# Patient Record
Sex: Female | Born: 1945 | Race: White | Hispanic: No | Marital: Married | Smoking: Former smoker
Health system: Southern US, Community
[De-identification: ages and names within clinical notes are randomized; demographics above are authoritative.]

## PROBLEM LIST (undated history)

## (undated) DIAGNOSIS — Z87448 Personal history of other diseases of urinary system: Secondary | ICD-10-CM

## (undated) DIAGNOSIS — M1990 Unspecified osteoarthritis, unspecified site: Secondary | ICD-10-CM

## (undated) DIAGNOSIS — J309 Allergic rhinitis, unspecified: Secondary | ICD-10-CM

## (undated) DIAGNOSIS — J189 Pneumonia, unspecified organism: Secondary | ICD-10-CM

## (undated) DIAGNOSIS — M797 Fibromyalgia: Secondary | ICD-10-CM

## (undated) DIAGNOSIS — I10 Essential (primary) hypertension: Secondary | ICD-10-CM

## (undated) DIAGNOSIS — E785 Hyperlipidemia, unspecified: Secondary | ICD-10-CM

## (undated) DIAGNOSIS — IMO0001 Reserved for inherently not codable concepts without codable children: Secondary | ICD-10-CM

## (undated) DIAGNOSIS — K219 Gastro-esophageal reflux disease without esophagitis: Secondary | ICD-10-CM

## (undated) DIAGNOSIS — N289 Disorder of kidney and ureter, unspecified: Secondary | ICD-10-CM

## (undated) DIAGNOSIS — K3184 Gastroparesis: Secondary | ICD-10-CM

## (undated) DIAGNOSIS — J45909 Unspecified asthma, uncomplicated: Secondary | ICD-10-CM

## (undated) DIAGNOSIS — Z8719 Personal history of other diseases of the digestive system: Secondary | ICD-10-CM

## (undated) DIAGNOSIS — D509 Iron deficiency anemia, unspecified: Secondary | ICD-10-CM

## (undated) DIAGNOSIS — C7A8 Other malignant neuroendocrine tumors: Secondary | ICD-10-CM

## (undated) DIAGNOSIS — K7400 Hepatic fibrosis, unspecified: Secondary | ICD-10-CM

## (undated) DIAGNOSIS — F32A Depression, unspecified: Secondary | ICD-10-CM

## (undated) DIAGNOSIS — I209 Angina pectoris, unspecified: Secondary | ICD-10-CM

## (undated) DIAGNOSIS — F329 Major depressive disorder, single episode, unspecified: Secondary | ICD-10-CM

## (undated) DIAGNOSIS — K7460 Unspecified cirrhosis of liver: Secondary | ICD-10-CM

## (undated) DIAGNOSIS — D485 Neoplasm of uncertain behavior of skin: Secondary | ICD-10-CM

## (undated) DIAGNOSIS — E119 Type 2 diabetes mellitus without complications: Secondary | ICD-10-CM

## (undated) DIAGNOSIS — H8090 Unspecified otosclerosis, unspecified ear: Secondary | ICD-10-CM

## (undated) DIAGNOSIS — F419 Anxiety disorder, unspecified: Secondary | ICD-10-CM

## (undated) DIAGNOSIS — K760 Fatty (change of) liver, not elsewhere classified: Secondary | ICD-10-CM

## (undated) DIAGNOSIS — F99 Mental disorder, not otherwise specified: Secondary | ICD-10-CM

## (undated) DIAGNOSIS — K5909 Other constipation: Secondary | ICD-10-CM

## (undated) HISTORY — DX: Essential (primary) hypertension: I10

## (undated) HISTORY — DX: Reserved for inherently not codable concepts without codable children: IMO0001

## (undated) HISTORY — DX: Gastro-esophageal reflux disease without esophagitis: K21.9

## (undated) HISTORY — PX: OTHER SURGICAL HISTORY: SHX169

## (undated) HISTORY — DX: Unspecified osteoarthritis, unspecified site: M19.90

## (undated) HISTORY — DX: Allergic rhinitis, unspecified: J30.9

## (undated) HISTORY — DX: Fibromyalgia: M79.7

## (undated) HISTORY — DX: Personal history of other diseases of urinary system: Z87.448

## (undated) HISTORY — DX: Pneumonia, unspecified organism: J18.9

## (undated) HISTORY — DX: Hyperlipidemia, unspecified: E78.5

## (undated) HISTORY — DX: Iron deficiency anemia, unspecified: D50.9

## (undated) HISTORY — DX: Personal history of other diseases of the digestive system: Z87.19

## (undated) HISTORY — PX: PR ESOPHAGOGASTRODUODENOSCOPY TRANSORAL DIAGNOSTIC: 43235

## (undated) HISTORY — PX: PR TOTAL ABDOMINAL HYSTERECT W/WO RMVL TUBE OVARY: 58150

## (undated) HISTORY — PX: PR TYMPANIC MEMB RPR W/WO PREPJ PERFOR PATCH: 69610

## (undated) HISTORY — DX: Other malignant neuroendocrine tumors: C7A.8

## (undated) HISTORY — PX: PR LIG/TRNSXJ FLP TUBE ABDL/VAG APPR UNI/BI: 58600

## (undated) HISTORY — DX: Gastroparesis: K31.84

## (undated) HISTORY — DX: Neoplasm of uncertain behavior of skin: D48.5

## (undated) HISTORY — DX: Unspecified otosclerosis, unspecified ear: H80.90

## (undated) HISTORY — PX: EGD: AMBSHX0053

## (undated) HISTORY — PX: PR ARTHROSCOPY KNEE DIAGNOSTIC W/WO SYNOVIAL BX SPX: 29870

## (undated) HISTORY — DX: Angina pectoris, unspecified: I20.9

---
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
## 1977-05-05 HISTORY — PX: KNEE ARTHROSCOPY: SUR90

## 1979-01-04 HISTORY — PX: TUBAL LIGATION: SHX77

## 1983-05-06 HISTORY — PX: ABDOMINAL HYSTERECTOMY: SHX81

## 1983-05-06 HISTORY — PX: MAMMOPLASTY, REDUCTION: SHX001378

## 1984-05-05 HISTORY — PX: BREAST SURGERY: SHX581

## 2007-05-22 ENCOUNTER — Encounter: Payer: Self-pay | Admitting: Family Medicine

## 2007-05-22 LAB — CONVERTED CEMR LAB
ALT: 37 units/L
AST: 44 units/L
Cholesterol: 120 mg/dL
Glucose, Bld: 100 mg/dL
Total Bilirubin: 1.3 mg/dL
Triglycerides: 109 mg/dL

## 2007-10-16 ENCOUNTER — Encounter: Payer: Self-pay | Admitting: Family Medicine

## 2008-01-13 ENCOUNTER — Encounter: Payer: Self-pay | Admitting: Family Medicine

## 2008-05-05 LAB — HM MAMMOGRAPHY

## 2008-05-29 ENCOUNTER — Encounter: Payer: Self-pay | Admitting: Family Medicine

## 2008-05-29 LAB — CONVERTED CEMR LAB
ALT: 46 units/L
AST: 53 units/L
Alkaline Phosphatase: 86 units/L

## 2008-07-11 ENCOUNTER — Encounter: Payer: Self-pay | Admitting: Family Medicine

## 2008-07-11 LAB — CONVERTED CEMR LAB
BUN: 17 mg/dL
Creatinine, Ser: 1 mg/dL
Free T4: 0.81 ng/dL
Glucose, Bld: 107 mg/dL
TSH: 0.39 microintl units/mL

## 2008-08-15 ENCOUNTER — Encounter: Payer: Self-pay | Admitting: Family Medicine

## 2008-12-19 ENCOUNTER — Encounter: Payer: Self-pay | Admitting: Family Medicine

## 2008-12-19 LAB — CONVERTED CEMR LAB
ALT: 43 U/L
AST: 42 U/L
BUN: 23 mg/dL
Cholesterol: 183 mg/dL
Creatinine, Ser: 1.1 mg/dL
Glucose, Bld: 101 mg/dL
HDL: 57 mg/dL
Hgb A1c MFr Bld: 5.8 %
LDL Cholesterol: 92 mg/dL
Microalb, Ur: 1.2 mg/dL
Potassium: 4.1 meq/L
Sodium: 146 meq/L
Total CHOL/HDL Ratio: 3.21
Triglycerides: 216 mg/dL

## 2009-01-22 ENCOUNTER — Encounter: Payer: Self-pay | Admitting: Family Medicine

## 2009-07-27 ENCOUNTER — Encounter: Payer: Self-pay | Admitting: Family Medicine

## 2009-07-27 LAB — CONVERTED CEMR LAB
Hgb A1c MFr Bld: 6.1 %
Potassium: 4.2 meq/L

## 2009-11-06 ENCOUNTER — Ambulatory Visit: Payer: Self-pay | Admitting: Family Medicine

## 2009-11-06 DIAGNOSIS — J309 Allergic rhinitis, unspecified: Secondary | ICD-10-CM | POA: Insufficient documentation

## 2009-11-06 DIAGNOSIS — K219 Gastro-esophageal reflux disease without esophagitis: Secondary | ICD-10-CM | POA: Insufficient documentation

## 2009-11-06 DIAGNOSIS — I10 Essential (primary) hypertension: Secondary | ICD-10-CM | POA: Insufficient documentation

## 2009-11-06 DIAGNOSIS — M79609 Pain in unspecified limb: Secondary | ICD-10-CM | POA: Insufficient documentation

## 2009-11-06 DIAGNOSIS — E119 Type 2 diabetes mellitus without complications: Secondary | ICD-10-CM | POA: Insufficient documentation

## 2009-11-06 DIAGNOSIS — IMO0001 Reserved for inherently not codable concepts without codable children: Secondary | ICD-10-CM | POA: Insufficient documentation

## 2009-11-06 DIAGNOSIS — Z87448 Personal history of other diseases of urinary system: Secondary | ICD-10-CM | POA: Insufficient documentation

## 2009-11-06 DIAGNOSIS — M1990 Unspecified osteoarthritis, unspecified site: Secondary | ICD-10-CM | POA: Insufficient documentation

## 2009-11-06 DIAGNOSIS — E785 Hyperlipidemia, unspecified: Secondary | ICD-10-CM

## 2009-11-06 DIAGNOSIS — Z8619 Personal history of other infectious and parasitic diseases: Secondary | ICD-10-CM

## 2009-11-06 DIAGNOSIS — Z9189 Other specified personal risk factors, not elsewhere classified: Secondary | ICD-10-CM | POA: Insufficient documentation

## 2009-11-08 LAB — CONVERTED CEMR LAB: Hgb A1c MFr Bld: 5.9 % (ref 4.6–6.5)

## 2009-11-29 ENCOUNTER — Encounter: Payer: Self-pay | Admitting: Family Medicine

## 2009-12-03 ENCOUNTER — Encounter: Payer: Self-pay | Admitting: Family Medicine

## 2009-12-03 ENCOUNTER — Telehealth: Payer: Self-pay | Admitting: Family Medicine

## 2009-12-04 ENCOUNTER — Encounter: Payer: Self-pay | Admitting: Family Medicine

## 2010-02-11 ENCOUNTER — Ambulatory Visit: Payer: Self-pay | Admitting: Family Medicine

## 2010-02-12 ENCOUNTER — Telehealth: Payer: Self-pay | Admitting: Family Medicine

## 2010-02-12 LAB — CONVERTED CEMR LAB: Hgb A1c MFr Bld: 5.9 % (ref 4.6–6.5)

## 2010-02-13 ENCOUNTER — Telehealth: Payer: Self-pay | Admitting: Family Medicine

## 2010-02-19 ENCOUNTER — Ambulatory Visit: Payer: Self-pay | Admitting: Family Medicine

## 2010-02-19 DIAGNOSIS — L659 Nonscarring hair loss, unspecified: Secondary | ICD-10-CM | POA: Insufficient documentation

## 2010-02-19 DIAGNOSIS — M25579 Pain in unspecified ankle and joints of unspecified foot: Secondary | ICD-10-CM | POA: Insufficient documentation

## 2010-02-20 LAB — CONVERTED CEMR LAB: TSH: 0.54 microintl units/mL (ref 0.35–5.50)

## 2010-03-05 ENCOUNTER — Telehealth: Payer: Self-pay | Admitting: Family Medicine

## 2010-03-06 ENCOUNTER — Encounter: Payer: Self-pay | Admitting: Family Medicine

## 2010-04-19 ENCOUNTER — Encounter: Payer: Self-pay | Admitting: Family Medicine

## 2010-04-24 ENCOUNTER — Telehealth: Payer: Self-pay | Admitting: Family Medicine

## 2010-05-08 ENCOUNTER — Telehealth: Payer: Self-pay | Admitting: Family Medicine

## 2010-05-10 ENCOUNTER — Ambulatory Visit
Admission: RE | Admit: 2010-05-10 | Discharge: 2010-05-10 | Payer: Self-pay | Source: Home / Self Care | Attending: Family Medicine | Admitting: Family Medicine

## 2010-05-10 ENCOUNTER — Other Ambulatory Visit: Payer: Self-pay | Admitting: Family Medicine

## 2010-05-10 LAB — LIPID PANEL
Cholesterol: 151 mg/dL (ref 0–200)
HDL: 54.1 mg/dL (ref >39.00)
LDL Cholesterol: 76 mg/dL (ref 0–99)
Total CHOL/HDL Ratio: 3
Triglycerides: 107 mg/dL (ref 0.0–149.0)
VLDL: 21.4 mg/dL (ref 0.0–40.0)

## 2010-05-10 LAB — HEMOGLOBIN A1C: Hgb A1c MFr Bld: 6.3 % (ref 4.6–6.5)

## 2010-06-04 NOTE — Letter (Signed)
Summary: Letter from Patient with Concerns  Letter from Patient with Concerns   Imported By: Lanelle Bal 12/10/2009 09:08:55  _____________________________________________________________________  External Attachment:    Type:   Image     Comment:   External Document

## 2010-06-04 NOTE — Assessment & Plan Note (Signed)
Summary: ESTABH FROM [facility]   Vital Signs:  Patient profile:   65 year old female Height:      62 inches Weight:      169.50 pounds BMI:     31.11 Temp:     98.4 degrees F oral Pulse rate:   80 / minute Pulse rhythm:   regular BP sitting:   128 / 74  (left arm) Cuff size:   large  Vitals Entered By: Samantha Simmons) (November 06, 2009 2:44 PM) CC: Establish from [place]   History of Present Illness: Fibromyalgia.  Longstanding history. Tolerated increase in topamax with nonbothersome tingling in hands.  Overall pain is much improved from initial level.  Uses vicodin, usually about 1 tab a day for breakthrough pain.  somedays requires no vicodin.  Has had previous responsible use of vicodin w/o h/o abuse/misuse.  asking about uptaper of meds at this point.  Has some topamax at home- either 25 or 50mg  caps from prev rx.  Taking 200mg  a day w/o complication.    R foot pain.  Near distal portion of the arch, on dorsal/plantar/medial side.  no trauma.  Present episodically for several weeks.  Pain with first step in AM.  Some days are more painful than others.  No change with shoes/barefoot.    1 episode of chest discomfort immediately after a meal that lasted about 1 minute.  Self resolved.  happened several weeks ago.  Hx of similar symptoms over last few years ( ~3-4 times total).  Always self revolved w/o intervention.   No h/o CAD but noted h/o GERD and h/o NSAID use for knee pain. Not SOB, sweaty, or having jaw pain with episode.  Occurred at rest and has good exercise tolerance o/w.   Diabetes:  Using medications without difficulties:yes Hypoglycemic episodes:no Hyperglycemic episodes:no Feet problems:as above Blood Sugars averaging:  ~120s.  Compliant with meds.  Has seen eye MD in <1 year.   Preventive Screening-Counseling & Management  Alcohol-Tobacco     Smoking Status: quit  Caffeine-Diet-Exercise     Does Patient Exercise: no  Allergies (verified): 1)  ! * Ivp  Dye  Past History:  Past Medical History: UTI'S, HX OF (ICD-V13.00) CHICKENPOX, HX OF (ICD-V15.9) HYPERTENSION (ICD-401.9) HYPERLIPIDEMIA (ICD-272.4) HEPATITIS B, HX OF (ICD-V12.09) DIABETES MELLITUS, TYPE II (ICD-250.00) ALLERGIC RHINITIS (ICD-477.9)   Osteoarthritis GERD  Past Surgical History: 1952    Pneumonia 1967    Childbirth Mid 70's  Stapidectomy (Left ear) Early 46's Stapidectomy (Right ear) Early 80's  Tubal Ligation 1985    Hysterectomy 1986    Reduction mammoplasty Early 1979   Arthroscopic Right Knee  (Multiple)  Family History: Heart Disease:  Parents, grandparents High blood pressure:  Parents Diabetes:  Parents, grandparents  Social History: Marital Status: Married Children: 1 Occupation: Retired Former Smoker, Have not smoked in over 22 years Alcohol use-yes, very little Regular exercise-no Smoking Status:  quit Does Patient Exercise:  no  Review of Systems       See HPI.  Otherwise noncontributory.    Physical Exam  General:  GEN: nad, alert and oriented HEENT: mucous membranes moist NECK: supple, no bruit CV: regular rate and rhythm  PULM: ctab, no inc wob ABD: soft, +bs EXT: no edema R foot: no loss of sensation.  Slight decrease in transverse arch.  Minimally tender to palpation on medial aspect of distal 1st MT.  prox longitudinal arch not tender to palpation. No gross gouty changes.    Impression &  Recommendations:  Problem # 1:  FIBROMYALGIA (ICD-729.1) Will taper meloxicam as below and contact clinic with update. Can increase topamax up to 250 mg a day as long as she is tolerating the numbness in the hands (which has gradually improved).  Continue as needed vicodin.  No new rx for this today.  Her updated medication list for this problem includes:    Meloxicam 15 Mg Tabs (Meloxicam) .Marland Kitchen... Take 1 tablet by mouth every morning    Aspirin 81 Mg Tabs (Aspirin) .Marland Kitchen... Take 1 tab by mouth at bedtime    Hydrocodone-acetaminophen 5-500  Mg Tabs (Hydrocodone-acetaminophen) .Marland Kitchen... Take 1 tablet by mouth two times a day for severe pain  Her updated medication list for this problem includes:    Meloxicam 15 Mg Tabs (Meloxicam) .Marland Kitchen... Take 1 tablet by mouth every morning    Aspirin 81 Mg Tabs (Aspirin) .Marland Kitchen... Take 1 tab by mouth at bedtime    Hydrocodone-acetaminophen 5-500 Mg Tabs (Hydrocodone-acetaminophen) .Marland Kitchen... Take 1 tablet by mouth two times a day for severe pain  Problem # 2:  FOOT PAIN, RIGHT (ICD-729.5) Rec inserts and stretching arch before getting out of bed.  No indication for imaging today.  follow up as needed.  she agrees.   Problem # 3:  DIABETES MELLITUS, TYPE II (ICD-250.00) Contact with labs and will attempt to taper metformin.  Continue exercise.  Her updated medication list for this problem includes:    Metformin Hcl 500 Mg Tabs (Metformin hcl) .Marland Kitchen... Take 1 tab by mouth at bedtime    Aspirin 81 Mg Tabs (Aspirin) .Marland Kitchen... Take 1 tab by mouth at bedtime  Her updated medication list for this problem includes:    Metformin Hcl 500 Mg Tabs (Metformin hcl) .Marland Kitchen... Take 1 tab by mouth at bedtime    Aspirin 81 Mg Tabs (Aspirin) .Marland Kitchen... Take 1 tab by mouth at bedtime  Orders: TLB-A1C / Hgb A1C (Glycohemoglobin) (83036-A1C)  Problem # 4:  GERD (ICD-530.81) taper NSAID as pain in foot allows and follow up as needed.  Her updated medication list for this problem includes:    Omeprazole 20 Mg Cpdr (Omeprazole) .Marland Kitchen... Take 1 tablet by mouth every morning  Complete Medication List: 1)  Omeprazole 20 Mg Cpdr (Omeprazole) .... Take 1 tablet by mouth every morning 2)  Amlodipine Besylate 10 Mg Tabs (Amlodipine besylate) .... Take 1 tablet by mouth every morning 3)  Hydrochlorothiazide 25 Mg Tabs (Hydrochlorothiazide) .... Take 1 tablet by mouth every morning 4)  Meloxicam 15 Mg Tabs (Meloxicam) .... Take 1 tablet by mouth every morning 5)  Metformin Hcl 500 Mg Tabs (Metformin hcl) .... Take 1 tab by mouth at bedtime 6)   Simvastatin 20 Mg Tabs (Simvastatin) .... Take 1 tab by mouth at bedtime 7)  Topamax 200 Mg Tabs (Topiramate) .... Take 1 tab by mouth at bedtime 8)  Vitamin D 1000 Unit Tabs (Cholecalciferol) .... 2,000 international units take 1 tablet by mouth every morning 9)  Aspirin 81 Mg Tabs (Aspirin) .... Take 1 tab by mouth at bedtime 10)  Fish Oil Oil (Fish oil) .... 1,000 mg. take 1 tab by mouth at bedtime 11)  Proventil Hfa 108 (90 Base) Mcg/act Aers (Albuterol sulfate) .... 2 puffs every 4 hours as needed 12)  Hydrocodone-acetaminophen 5-500 Mg Tabs (Hydrocodone-acetaminophen) .... Take 1 tablet by mouth two times a day for severe pain 13)  Oxybutynin Chloride 5 Mg Tabs (Oxybutynin chloride) .... Take 1 tablet by mouth three times a day 14)  Diphenhydramine Hcl  25 Mg Caps (Diphenhydramine hcl) .... One or two tabs every 4-6 hours as needed   Patient Instructions: 1)  Call back at the end of the month.  Plan to cut meloxicam dose in half and monitor for change in pain level.  If doing well, call add on home supply of topamax up to total of 250mg  a day.  Monitor for numbness and note progress.  Use arch supports for R foot pain.    Current Allergies (reviewed today): ! * IVP DYE

## 2010-06-04 NOTE — Progress Notes (Signed)
Summary: pt wrote you a letter  Phone Note Call from Patient Call back at Home Phone [phone]   Caller: Patient Call For: [name] Summary of Call: Pt has written you a letter regarding meds and updates.  Letter is on your desk.                   Lowella Petties CMA, AAMA  March 05, 2010 4:27 PM   Follow-up for Phone Call        will review when back in office.  Follow-up by: [name],  March 05, 2010 10:29 PM

## 2010-06-04 NOTE — Progress Notes (Signed)
Summary: Rx Omeprazole  Phone Note From Pharmacy Call back at [phone]   Caller: [facility] Call For: Dr. Para March  Summary of Call: Received a form from pharmacy to verify directions on Omeprazole, directions do not match the quantity. Form is in your in box.  Med list shows every other day.  Spoke to patient and was informed that she tried to cut back on this and had to go back to taking this every day. Changed on med sheet. Please complete form and will fax back. It should be once a day and #90. Initial call taken by: Sydell Axon LPN,  February 13, 2010 9:15 AM  Follow-up for Phone Call        signed, in my out box.  please send in.  Follow-up by: [name],  February 13, 2010 1:59 PM  Additional Follow-up for Phone Call Additional follow up Details #1::        Form faxed as directed Additional Follow-up by: Janee Morn CMA Duncan Dull),  February 13, 2010 2:27 PM    New/Updated Medications: OMEPRAZOLE 20 MG CPDR (OMEPRAZOLE) Take 1 tablet by mouth every morning

## 2010-06-04 NOTE — Progress Notes (Signed)
Summary: refill requests for mobic, topiramate, vicodin  Phone Note Refill Request Message from:  Patient  Refills Requested: Medication #1:  MELOXICAM 15 MG TABS Take 1 tablet by mouth every morning  Medication #2:  TOPAMAX 200 MG TABS Take 1 tab by mouth at bedtime  Medication #3:  HYDROCODONE-ACETAMINOPHEN 5-500 MG TABS Take 1 tablet by mouth two times a day for severe pain Please send mobic and topamax to [facility], vicodin goes to [facility] [address].  Initial call taken by: Lowella Petties CMA,  February 12, 2010 11:00 AM  Follow-up for Phone Call        please call in the vicodin.  the others were sent to [facility].  thanks.  Follow-up by: [name],  February 12, 2010 2:08 PM  Additional Follow-up for Phone Call Additional follow up Details #1::        Rx called to pharmacy Additional Follow-up by: Benny Lennert CMA [facility] Dull),  February 12, 2010 2:17 PM    Prescriptions: HYDROCODONE-ACETAMINOPHEN 5-500 MG TABS (HYDROCODONE-ACETAMINOPHEN) Take 1 tablet by mouth two times a day for severe pain  #60 x 5   Entered and Authorized by:   [name]   Signed by:   [name] on 02/12/2010   Method used:   Telephoned to ...       [facility]  [id number]       [address]       [place], [place]  [address]       Ph: [phone] or [phone]       Fax: [phone]   RxID:   [id number] TOPAMAX 200 MG TABS (TOPIRAMATE) Take 1 tab by mouth at bedtime  #90 x 3   Entered and Authorized by:   [name]   Signed by:   [name] on 02/12/2010   Method used:   Faxed to ...       [facility] (mail-order)             , [place]         Ph: [phone]       Fax: [phone]   RxID:   [id number] MELOXICAM 15 MG TABS (MELOXICAM) Take 1 tablet by mouth every morning  #90 x 3   Entered and Authorized by:   [name]   Signed by:   [name] on 02/12/2010   Method used:   Faxed to ...       [facility] [place] (mail-order)             , [place]         Ph: [phone]       Fax: [phone]   RxID:   [id number]

## 2010-06-04 NOTE — Miscellaneous (Signed)
  Clinical Lists Changes  Medications: Added new medication of TOPIRAMATE 50 MG TABS (TOPIRAMATE) Take 1 tablet by mouth once a day - Signed Changed medication from OMEPRAZOLE 20 MG CPDR (OMEPRAZOLE) Take 1 tablet by mouth every morning to OMEPRAZOLE 20 MG CPDR (OMEPRAZOLE) Take 1 tablet by mouth every other morning. Rx of TOPIRAMATE 50 MG TABS (TOPIRAMATE) Take 1 tablet by mouth once a day;  #90 x 3;  Signed;  Entered by: Delilah Shan CMA (AAMA);  Authorized by: [name];  Method used: Faxed to [facility] [place], , , [place]  , Ph: [phone], Fax: [phone]    Prescriptions: TOPIRAMATE 50 MG TABS (TOPIRAMATE) Take 1 tablet by mouth once a day  #90 x 3   Entered by:   Delilah Shan CMA (AAMA)   Authorized by:   [name]   Signed by:   Delilah Shan CMA (AAMA) on 12/04/2009   Method used:   Faxed to ...       [facility] (mail-order)             , [place]         Ph: [phone]       Fax: [phone]   RxID:   [id number]

## 2010-06-04 NOTE — Letter (Signed)
Summary: Letter from Patient Regarding Meds  Letter from Patient Regarding Meds   Imported By: Lanelle Bal 03/12/2010 07:59:16  _____________________________________________________________________  External Attachment:    Type:   Image     Comment:   External Document

## 2010-06-04 NOTE — Progress Notes (Signed)
Summary: Update from patient  Phone Note Call from Patient Call back at [phone]   Caller: Patient Call For: [name] Summary of Call: Please see typed letter that patient dropped off for you regarding her medications and possible refills. Note is in your in box. Initial call taken by: Sydell Axon LPN,  December 03, 2009 2:04 PM  Follow-up for Phone Call        see notes on hardcopy.  thanks.  Follow-up by: [name],  December 03, 2009 9:39 PM  Additional Follow-up for Phone Call Additional follow up Details #1::        Done.  See scanned copy of letter with instructions. Additional Follow-up by: Delilah Shan CMA (AAMA),  December 04, 2009 9:16 AM

## 2010-06-04 NOTE — Assessment & Plan Note (Signed)
Summary: L HEEL PAIN,CHECK PLACE ON R FOOT/CLE   Vital Signs:  Patient profile:   65 year old female Weight:      167.12 pounds Temp:     98.2 degrees F oral Pulse rate:   72 / minute Pulse rhythm:   regular BP sitting:   112 / 70  (left arm) Cuff size:   large  Vitals Entered By: Sydell Axon LPN (February 19, 2010 1:55 PM) CC: Heel pain left foot and check place on right foot   History of Present Illness: L ankle painful with stretching this AM.  Pain with walking.  This is new for patient.  No trauma.  Normally wearing supportive shoes.  Didn't feel a pop.    Possibly stepped on something a few months ago.  Husband checked it and thought he got something out of lesion on R foot.  Since then, patient feels like she is stepping on something.  No erythema.   Hair is thinning out.  Progressive over last  ~1 year.  On topamax since spring 2010.  No other triggers known.   Allergies: 1)  ! * Ivp Dye  Review of Systems       See HPI.  Otherwise negative.    Physical Exam  General:  NAD hair is thinning, no discrete patches of alopecia.  loss is more diffuse.   L ankle: not tender to palpation on bilateral mal, achilles is tender to palpation. not tender to palpation on plantar aspect R foot with distal plantar hyperkeratosis, no FB appreciated.    Impression & Recommendations:  Problem # 1:  FOOT PAIN, RIGHT (ICD-729.5) small lesion with out visible FB on bottom of foot.  I would address L foot and then explore this if symptoms continue.  She agrees.   Problem # 2:  HAIR LOSS (ICD-704.00) start MVI, check TSH and if wnl, decrease topamax.  This may be due to the topamax, which would be unfortunate as it has provided so much relief for patient's pain.   Orders: TLB-TSH (Thyroid Stimulating Hormone) (84443-TSH)  Problem # 3:  ANKLE PAIN, LEFT (ICD-719.47) crutches, no weight bearing and call back if not better.  If improving, can graduate into a shoe with heel wedge. Likely  achilles strain, partial tear.  If not improving, refer to ortho.   Orders: T-Ankle Comp Left Min 3 Views (73610TC)  Complete Medication List: 1)  Omeprazole 20 Mg Cpdr (Omeprazole) .... Take 1 tablet by mouth every morning 2)  Amlodipine Besylate 10 Mg Tabs (Amlodipine besylate) .... Take 1 tablet by mouth every morning 3)  Hydrochlorothiazide 25 Mg Tabs (Hydrochlorothiazide) .... Take 1 tablet by mouth every morning 4)  Meloxicam 15 Mg Tabs (Meloxicam) .... Take 1 tablet by mouth every morning 5)  Simvastatin 20 Mg Tabs (Simvastatin) .... Take 1 tab by mouth at bedtime 6)  Topamax 200 Mg Tabs (Topiramate) .... Take 1 tab by mouth at bedtime 7)  Vitamin D 1000 Unit Tabs (Cholecalciferol) .... 2,000 international units take 1 tablet by mouth every morning 8)  Aspirin 81 Mg Tabs (Aspirin) .... Take 1 tab by mouth at bedtime 9)  Fish Oil Oil (Fish oil) .... 1,000 mg. take 1 tab by mouth at bedtime 10)  Proventil Hfa 108 (90 Base) Mcg/act Aers (Albuterol sulfate) .... 2 puffs every 4 hours as needed 11)  Hydrocodone-acetaminophen 5-500 Mg Tabs (Hydrocodone-acetaminophen) .... Take 1 tablet by mouth two times a day for severe pain 12)  Oxybutynin Chloride 5 Mg  Tabs (Oxybutynin chloride) .... Take 1 tablet by mouth three times a day 13)  Diphenhydramine Hcl 25 Mg Caps (Diphenhydramine hcl) .... One or two tabs every 4-6 hours as needed 14)  Topiramate 50 Mg Tabs (Topiramate) .... Take 1 tablet by mouth once a day  Patient Instructions: 1)  I'll let your know about your topamax dose.  We'll need to decrease it if your thyroid test is normal.   2)  I would use the crutches and don't put any weight on your foot.  Use the vicodin in the meantime.  If you aren't improving, we can set you up with ortho.  Call me later in the week if you aren't better. 3)  Take care.    Orders Added: 1)  Est. Patient Level IV [09811] 2)  T-Ankle Comp Left Min 3 Views [73610TC] 3)  TLB-TSH (Thyroid Stimulating  Hormone) [91478-GNF]    Current Allergies (reviewed today): ! * IVP DYE

## 2010-06-04 NOTE — Letter (Signed)
Summary: [facility]   Imported By: Lanelle Bal 12/10/2009 09:11:57  _____________________________________________________________________  External Attachment:    Type:   Image     Comment:   External Document  Appended Document: [facility]    Clinical Lists Changes  Observations: Added new observation of DIAB EYE EX: No diabetic retinopathy [DR] (01/22/2009 14:00)         Diabetic Eye Exam  Procedure date:  01/22/2009  Findings:      No diabetic retinopathy [DR]

## 2010-06-06 NOTE — Progress Notes (Signed)
Summary: regarding labs  Phone Note Call from Patient Call back at Home Phone [phone]   Caller: Patient Call For: [name] Summary of Call: Pt states she went to her dermatologist and was dx'd with shingles.  She is coming in for lab work on friday and asks if you want to add any labs to what she is getting. Initial call taken by: Lowella Petties CMA, AAMA,  May 08, 2010 9:43 AM  Follow-up for Phone Call        can you get me a list of her labs to be drawn on Friday? Follow-up by: [name],  May 08, 2010 11:23 AM  Additional Follow-up for Phone Call Additional follow up Details #1::        A1c, dx 250.00; Lipid 272.0 /rl Additional Follow-up by: Mills Koller,  May 08, 2010 11:26 AM    Additional Follow-up for Phone Call Additional follow up Details #2::    please tell patient that she should be good with the labs as is- we can talk about them on the 11th.  thanks. [name]  May 08, 2010 11:31 AM   Patient Advised. Lugene Fuquay CMA (AAMA)  May 08, 2010 11:49 AM

## 2010-06-06 NOTE — Progress Notes (Signed)
Summary: Other labs  Phone Note Call from Patient Call back at Home Phone [phone]   Caller: Patient Call For: [name] Summary of Call: When I phoned the patient, as requested, from a previous phone note, she was advised that we would be adding a lipid panel to her lab draw in January.  She then asked if that would test for the problem that she is having with hot flashes.  She said she had not spoken to you about this but that it is getting pretty bad.  I told her that I would mention it to you but because the two of you had not discussed it at an OV, I wasn't sure that you would feel comfortable ordering labs for that problem.  She said she totally understood and that if you could not, she would speak with you at her next OV and do it then or if it becomes worse, she will schedule an appointment to come in for it. Initial call taken by: Delilah Shan CMA Duncan Dull),  April 24, 2010 8:49 AM  Follow-up for Phone Call        usually you don't have to check labs (ie hormone levels) for hot flashes in postmenopausal women. if it gets worse, let me know and we can talk about it at an OV.  thanks.  Follow-up by: [name],  April 24, 2010 11:44 AM  Additional Follow-up for Phone Call Additional follow up Details #1::        Patient Advised.  Additional Follow-up by: Delilah Shan CMA Duncan Dull),  April 24, 2010 2:50 PM

## 2010-06-06 NOTE — Letter (Signed)
Summary: Letter from Patient to Dr.Duncan  Letter from Patient to Dr.Duncan   Imported By: Beau Fanny 04/22/2010 16:55:19  _____________________________________________________________________  External Attachment:    Type:   Image     Comment:   External Document  Appended Document: Letter from Patient to Dr.Duncan Please call patient.  I would add on lipid (272.0) to the A1c in 1/12.  If lipids are much improved, I would think about cutting down on the simvastatin. If her BP gets lower, we could decrease some of the BP meds. Please ask patient to talk to Dr. Jorja Loa about the hair loss/thinning and see if he thinks it is related to the topamax.  It would be reasonable to get patient's pain controlled on the lowest dose of vicodin (in the long term) but I wouldn't change her meds around for now.  I'd like her to see Dr. Jorja Loa and then we should meet in the spring to talk about options for pain meds.  thanks.   Patient Advised.  Lipid added to labs in January.  Lugene Fuquay CMA Duncan Dull)  April 24, 2010 8:43 AM    Clinical Lists Changes  Medications: Removed medication of TOPIRAMATE 50 MG TABS (TOPIRAMATE) Take 1 tablet by mouth once a day - Signed Removed medication of TOPAMAX 200 MG TABS (TOPIRAMATE) Take 1 tab by mouth at bedtime - Signed Allergies: Added new allergy or adverse reaction of * TOPAMAX - Signed Observations: Added new observation of MEDRECON: current updated (04/22/2010 23:56) Added new observation of ALLERGY REV: Done (04/22/2010 23:56)        Current Medications (verified): 1)  Omeprazole 20 Mg Cpdr (Omeprazole) .... Take 1 Tablet By Mouth Every Morning 2)  Amlodipine Besylate 10 Mg Tabs (Amlodipine Besylate) .... Take 1 Tablet By Mouth Every Morning 3)  Hydrochlorothiazide 25 Mg Tabs (Hydrochlorothiazide) .... Take 1 Tablet By Mouth Every Morning 4)  Meloxicam 15 Mg Tabs (Meloxicam) .... Take 1 Tablet By Mouth Every Morning 5)  Simvastatin 20 Mg Tabs  (Simvastatin) .... Take 1 Tab By Mouth At Bedtime 6)  Vitamin D 1000 Unit  Tabs (Cholecalciferol) .... 2,000 International Units Take 1 Tablet By Mouth Every Morning 7)  Aspirin 81 Mg  Tabs (Aspirin) .... Take 1 Tab By Mouth At Bedtime 8)  Fish Oil   Oil (Fish Oil) .... 1,000 Mg. Take 1 Tab By Mouth At Bedtime 9)  Proventil Hfa 108 (90 Base) Mcg/act Aers (Albuterol Sulfate) .... 2 Puffs Every 4 Hours As Needed 10)  Hydrocodone-Acetaminophen 5-500 Mg Tabs (Hydrocodone-Acetaminophen) .... Take 1 Tablet By Mouth Two Times A Day For Severe Pain 11)  Oxybutynin Chloride 5 Mg Tabs (Oxybutynin Chloride) .... Take 1 Tablet By Mouth Three Times A Day 12)  Diphenhydramine Hcl 25 Mg Caps (Diphenhydramine Hcl) .... One or Two Tabs Every 4-6 Hours As Needed  Allergies: 1)  ! * Ivp Dye 2)  ! * Topamax

## 2010-07-19 ENCOUNTER — Ambulatory Visit (INDEPENDENT_AMBULATORY_CARE_PROVIDER_SITE_OTHER): Payer: BC Managed Care – PPO | Admitting: Family Medicine

## 2010-07-19 ENCOUNTER — Encounter: Payer: Self-pay | Admitting: Family Medicine

## 2010-07-19 DIAGNOSIS — M25569 Pain in unspecified knee: Secondary | ICD-10-CM | POA: Insufficient documentation

## 2010-07-26 ENCOUNTER — Telehealth: Payer: Self-pay | Admitting: *Deleted

## 2010-07-26 NOTE — Telephone Encounter (Signed)
Please advise me on adding IVP dye to the pt's allergy list.  Thanks.

## 2010-07-26 NOTE — Telephone Encounter (Signed)
Pt called to report that her left knee is doing better.  Her fibromyalgia pain is no better.  She is having some pain in her right knee, but she thinks that is from over compensating.

## 2010-07-26 NOTE — Telephone Encounter (Signed)
I called pt.  She was intolerant of lyrica and had hair loss on tomapax.  She is improving with the knee pain.  She's going to [place] soon and will need a refill on the vicodin.  We talked about cymbalta.  She's going to think about this in the meantime and call me after she gets back from the trip.    Please call in vicodin with same sig as prev, #60, 1rf.  [facility].  Thanks.  Okay to call in Monday.

## 2010-07-30 NOTE — Telephone Encounter (Signed)
Omnipaque dye is contrast dye in epic and was added to allergy list

## 2010-07-31 NOTE — Telephone Encounter (Signed)
Medication called to pharmacy. 

## 2010-08-01 NOTE — Assessment & Plan Note (Signed)
Summary: LEFT KNEE PAIN/CLE  [employer]   Vital Signs:  Patient profile:   65 year old female Height:      62 inches Weight:      192 pounds BMI:     35.24 Temp:     98.1 degrees F oral Pulse rate:   76 / minute Pulse rhythm:   regular BP sitting:   124 / 76  (left arm) Cuff size:   large  Vitals Entered By: Delilah Shan CMA Darald Uzzle Dull) (July 19, 2010 10:47 AM) CC: Left knee pain   History of Present Illness: Prev ankle pain got better.   Current L knee pain started up  ~1 week ago.   H/o R patellar fx and occ R knee pain as she was compensating.  No trigger for L knee pain.  Has pain across the front and back of knee and down the upper portion of the calf.  Not puffy, red, swollen.  no popping, clicking, locking.  Hasn't had to go up stairs.  On meloxicam.  Vicodin helped some. Better early AM, but hurts before getting out of bed.  Worse after getting up from sitting down.  Better after walking for a short distance.    Inc in pain of the topamax but her hair is improved.    Has urinary frequency, but no pain, burning.  Had prev used ditropan with relief.   Allergies: 1)  ! * Ivp Dye 2)  ! * Topamax  Past History:  Past Medical History: Last updated: 11/29/2009 UTI'S, HX OF (ICD-V13.00) CHICKENPOX, HX OF (ICD-V15.9) HYPERTENSION (ICD-401.9) HYPERLIPIDEMIA (ICD-272.4) HEPATITIS B, HX OF (ICD-V12.09) DIABETES MELLITUS, TYPE II (ICD-250.00) ALLERGIC RHINITIS (ICD-477.9) Fibromyalgia Osteoarthritis GERD  Review of Systems       See HPI.  Otherwise negative.    Physical Exam  General:  no apparent distress normocephalic atraumatic L knee not puffy or bruised, no erythema normal range of motion, no patellar click.  joint line not tender to palpation posterrior medial side of knee tender to palpation but not at the distal hamstring- she is tender near the origin of the gatroc.  body of gastroc not tender to palpation.  ligamentously intact on testing of the knee w/o meniscal  findings .   Impression & Recommendations:  Problem # 1:  KNEE PAIN, LEFT (ICD-719.46) likely gastroc strain.  I would rest and follow up as needed.  She has heel lifts; continue those.  no indication for imaging.   Her updated medication list for this problem includes:    Meloxicam 15 Mg Tabs (Meloxicam) .Marland Kitchen... Take 1 tablet by mouth every morning    Aspirin 81 Mg Tabs (Aspirin) .Marland Kitchen... Take 1 tab by mouth at bedtime    Hydrocodone-acetaminophen 5-500 Mg Tabs (Hydrocodone-acetaminophen) .Marland Kitchen... Take 1 tablet by mouth two times a day for severe pain  Complete Medication List: 1)  Omeprazole 20 Mg Cpdr (Omeprazole) .... Take 1 tablet by mouth every morning 2)  Amlodipine Besylate 10 Mg Tabs (Amlodipine besylate) .... Take 1 tablet by mouth every morning 3)  Hydrochlorothiazide 25 Mg Tabs (Hydrochlorothiazide) .... Take 1 tablet by mouth every morning 4)  Meloxicam 15 Mg Tabs (Meloxicam) .... Take 1 tablet by mouth every morning 5)  Simvastatin 20 Mg Tabs (Simvastatin) .... Take 1 tab by mouth at bedtime 6)  Vitamin D 1000 Unit Tabs (Cholecalciferol) .... 2,000 international units take 1 tablet by mouth every morning 7)  Aspirin 81 Mg Tabs (Aspirin) .... Take 1 tab by mouth at bedtime  8)  Fish Oil Oil (Fish oil) .... 1,000 mg. take 1 tab by mouth at bedtime 9)  Proventil Hfa 108 (90 Base) Mcg/act Aers (Albuterol sulfate) .... 2 puffs every 4 hours as needed 10)  Hydrocodone-acetaminophen 5-500 Mg Tabs (Hydrocodone-acetaminophen) .... Take 1 tablet by mouth two times a day for severe pain 11)  Oxybutynin Chloride 5 Mg Tabs (Oxybutynin chloride) .... Take 1 tablet by mouth three times a day 12)  Diphenhydramine Hcl 25 Mg Caps (Diphenhydramine hcl) .... One or two tabs every 4-6 hours as needed 13)  Multivitamins Tabs (Multiple vitamin) .... Take 1 tablet by mouth once a day 14)  Vitamin B Complex-c Caps (B complex-c) .... Take 1 capsule by mouth once a day  Patient Instructions: 1)  I would take  the meloxicam and the vicodin for now.  You can ice your knee for the pain.  I would gently stretch it over the next few days.  Call me with an update.  Use the ditropan and call me if you have any burning.   2)  Take care.     Orders Added: 1)  Est. Patient Level III [04540]    Current Allergies (reviewed today): ! * IVP DYE ! * TOPAMAX

## 2010-09-06 ENCOUNTER — Other Ambulatory Visit: Payer: Self-pay | Admitting: *Deleted

## 2010-09-06 MED ORDER — MELOXICAM 15 MG PO TABS: ORAL_TABLET | ORAL | Status: DC

## 2010-09-06 NOTE — Telephone Encounter (Signed)
Patient used to get this through mail order, but is now [facility]

## 2010-09-06 NOTE — Telephone Encounter (Signed)
Sent!

## 2010-09-11 ENCOUNTER — Other Ambulatory Visit: Payer: Self-pay | Admitting: *Deleted

## 2010-09-11 MED ORDER — AMLODIPINE BESYLATE 10 MG PO TABS: ORAL_TABLET | ORAL | Status: DC

## 2010-09-11 MED ORDER — OMEPRAZOLE 20 MG PO TBEC: DELAYED_RELEASE_TABLET | ORAL | Status: DC

## 2010-09-11 MED ORDER — HYDROCHLOROTHIAZIDE 25 MG PO TABS: ORAL_TABLET | ORAL | Status: DC

## 2010-09-24 ENCOUNTER — Ambulatory Visit (INDEPENDENT_AMBULATORY_CARE_PROVIDER_SITE_OTHER): Payer: BC Managed Care – PPO | Admitting: Family Medicine

## 2010-09-24 ENCOUNTER — Encounter: Payer: Self-pay | Admitting: Family Medicine

## 2010-09-24 DIAGNOSIS — IMO0001 Reserved for inherently not codable concepts without codable children: Secondary | ICD-10-CM

## 2010-09-24 DIAGNOSIS — L659 Nonscarring hair loss, unspecified: Secondary | ICD-10-CM

## 2010-09-24 DIAGNOSIS — M25569 Pain in unspecified knee: Secondary | ICD-10-CM

## 2010-09-24 MED ORDER — DULOXETINE HCL 30 MG PO CPEP: 30.0000 mg | ORAL_CAPSULE | Freq: Every day | ORAL | Status: DC

## 2010-09-24 MED ORDER — AMLODIPINE BESYLATE 10 MG PO TABS: ORAL_TABLET | ORAL | Status: DC

## 2010-09-24 MED ORDER — OMEPRAZOLE 20 MG PO TBEC: DELAYED_RELEASE_TABLET | ORAL | Status: DC

## 2010-09-24 MED ORDER — HYDROCHLOROTHIAZIDE 25 MG PO TABS: ORAL_TABLET | ORAL | Status: DC

## 2010-09-24 MED ORDER — MELOXICAM 15 MG PO TABS: ORAL_TABLET | ORAL | Status: DC

## 2010-09-24 NOTE — Progress Notes (Signed)
Fu for fibromyalgia.  Inc in pain recently.  Still trying to sell house in [place] and this has been stressful.  Also with changes at home, see below.  Diffuse muscle aches w/o trigger.  Prev did better on topamax, but had hair changes.  This resolved after stopping the medicine.  Asking about options.  Her mood has been down, but w/o SI/HI.    Per patient, Husband has been in counseling for internet porn addiction.  This has evidently been a longstanding strain on their relationship, but I didn't know about it until today.  She had gone through counseling before.  Per report, she is safe at home but is trying to make long term plans about their relationship.   L knee pain. Medial joint pain.  Gastroc pain is improved.  She is asking about options.  Meds, vitals, and allergies reviewed.   ROS: See HPI.  Otherwise, noncontributory.  nad occ borderline tearful but regains composure.  Hair appears wnl rrr ctab L knee w/o effusion and joint is stable on testing but medial/anterior joint line pain.  No patellar pain on compression.  No locking and normal rom.

## 2010-09-24 NOTE — Patient Instructions (Signed)
I sent your meds to the pharmacy.  We'll be in touch in the 1-2 weeks- if your have troubles in the meantime, then let me know.  Take care.

## 2010-09-25 ENCOUNTER — Encounter: Payer: Self-pay | Admitting: Family Medicine

## 2010-09-25 NOTE — Assessment & Plan Note (Signed)
Likely chronic OA.  I would continue with current meds and we'll try to address fibromyalgia pain first.  She agrees.

## 2010-09-25 NOTE — Assessment & Plan Note (Signed)
Likely med related, now resolved.

## 2010-09-25 NOTE — Assessment & Plan Note (Addendum)
Okay for outpatient fu.  No SI/HI and okay to try on cymbalta.  I'll call her about update on condition.  She has been through therapy and will continue to weigh her options about her relationship. >25 min spent with face to face with patient.

## 2010-10-02 ENCOUNTER — Telehealth: Payer: Self-pay | Admitting: Family Medicine

## 2010-10-02 NOTE — Telephone Encounter (Signed)
I called to see how patient was doing with the med change.  LMOVM for her to call back.

## 2010-10-03 ENCOUNTER — Telehealth: Payer: Self-pay | Admitting: Family Medicine

## 2010-10-03 NOTE — Telephone Encounter (Signed)
She is feeling some better, the pain is some better.  No hair changes.  Mood is stable.  She is tired, some of that may be related to getting through the house sale.  She is going to continue as is and call back in a few week with update, sooner if needed.  She agrees.

## 2010-10-14 ENCOUNTER — Telehealth: Payer: Self-pay | Admitting: *Deleted

## 2010-10-14 DIAGNOSIS — R739 Hyperglycemia, unspecified: Secondary | ICD-10-CM

## 2010-10-14 MED ORDER — DULOXETINE HCL 30 MG PO CPEP: 30.0000 mg | ORAL_CAPSULE | Freq: Every day | ORAL | Status: DC

## 2010-10-14 NOTE — Telephone Encounter (Signed)
Patient called to let you know that the cymbalta has really helped with her fibromyalgia, but she does feel jittery at times and also is concerned because her blood sugar readings. Some of her readings have been 284, 236, 189, 209. She is afraid that the cymbalta is affecting her readings. She says that she will need a refill if you want for her to continue taking it.  Please advise. [facility] [address]

## 2010-10-14 NOTE — Telephone Encounter (Signed)
Please call the patient to get a lab visit for an A1c and glucose.  Have her bring her meter and calibrate it against ours.  There are samples of cymbalta for her along with the printed rx in my office.  She can get those at the lab visit.  Thanks.

## 2010-10-15 NOTE — Telephone Encounter (Signed)
Advised pt, lab appt made. 

## 2010-10-18 ENCOUNTER — Telehealth: Payer: Self-pay | Admitting: Radiology

## 2010-10-18 ENCOUNTER — Other Ambulatory Visit (INDEPENDENT_AMBULATORY_CARE_PROVIDER_SITE_OTHER): Payer: BC Managed Care – PPO | Admitting: Family Medicine

## 2010-10-18 DIAGNOSIS — R7309 Other abnormal glucose: Secondary | ICD-10-CM

## 2010-10-18 DIAGNOSIS — E119 Type 2 diabetes mellitus without complications: Secondary | ICD-10-CM

## 2010-10-18 DIAGNOSIS — R739 Hyperglycemia, unspecified: Secondary | ICD-10-CM

## 2010-10-18 LAB — HEMOGLOBIN A1C: Hgb A1c MFr Bld: 7.9 % — ABNORMAL HIGH (ref 4.6–6.5)

## 2010-10-18 NOTE — Telephone Encounter (Signed)
Patient came in today for Glucose, A1C. She brought her glucose meter and did a finger stick at the same time for comparison Result of finger stick was 203. I also put a list of her blood sugars in your in basket. Please notify patient of results and comparison when labs come back today. Samantha Simmons

## 2010-10-18 NOTE — Telephone Encounter (Signed)
I am awaiting the A1c result.

## 2010-10-20 NOTE — Telephone Encounter (Signed)
Sugar readings had been 236-286 ~2h after eating, 175-189 AM fasting.  203 corresponded to the lab data. I will await A1c.

## 2010-10-21 NOTE — Telephone Encounter (Signed)
Cancel the 11/04/10 lab visit, f/u 3 months.  I would check sugar ~every other morning from this point forward and notify us of an upward trend or if consistently >250 AM fasting.  Thanks.

## 2010-10-21 NOTE — Telephone Encounter (Signed)
Patient advised as instructed via telephone.  She stated that Saturday morning her BS was 333 after breakfast, 183 two hours later, and 198 after lunch.  Sunday it was 181 fasting, 239 two hours after she took Cymbalta, and she took a Metformin and two hours after taking Metformin it was 218.  Today it was 207 fasting, 216, and then 149 at 2:00.  Please advise.  She has a lab appt on 11/04/2010 for an A1C and wants to know whether to keep that appt or just come back in 3 months.

## 2010-10-21 NOTE — Telephone Encounter (Signed)
Please call pt.  A1c is up to 7.9.  She isn't at the point where she'd have to go back on meds.  If she is able to work on diet and weight, she may be able to get this back down.  I would recheck A1c in 3 months with OV a few days later.  Come in for visit sooner if her sugar is progressively higher.  Thanks.

## 2010-10-22 ENCOUNTER — Other Ambulatory Visit: Payer: Self-pay | Admitting: *Deleted

## 2010-10-22 MED ORDER — GLUCOSE BLOOD VI STRP: ORAL_STRIP | Status: AC

## 2010-10-22 NOTE — Telephone Encounter (Signed)
Patient notified. Lab for 11/04/10 canceled as instructed. Patient will call back to schedule A1C for 3 months.

## 2010-11-04 ENCOUNTER — Other Ambulatory Visit: Payer: Self-pay

## 2010-11-04 ENCOUNTER — Other Ambulatory Visit: Payer: Self-pay | Admitting: Family Medicine

## 2010-11-04 MED ORDER — DULOXETINE HCL 30 MG PO CPEP: 30.0000 mg | ORAL_CAPSULE | Freq: Every day | ORAL | Status: DC

## 2010-11-05 ENCOUNTER — Telehealth: Payer: Self-pay | Admitting: Family Medicine

## 2010-11-05 NOTE — Telephone Encounter (Signed)
Noted  

## 2010-11-05 NOTE — Telephone Encounter (Signed)
Please call pt.  Sugars have been 170-180s fasting over last 2 weeks.  I would continue to work on diet and weight.  It appears safe for now to hold off on the metformin and recheck the A1c as planned.  However, if she prefers, she can continue diet/weight effort and gradually inc the metformin in the meantime.  Start with 500mg  a day for 1 week and then inc to 1 po bid if AM sugars are still consistently above 150.  Thanks.

## 2010-11-05 NOTE — Telephone Encounter (Signed)
Patient notified as instructed by telephone. Was informed by patient that she will continue the diet/wieght effort at this time and will hold off on the Metformin. Patient states that she already has her lab work scheduled.

## 2010-11-07 ENCOUNTER — Other Ambulatory Visit: Payer: Self-pay | Admitting: *Deleted

## 2010-11-07 MED ORDER — HYDROCODONE-ACETAMINOPHEN 5-500 MG PO TABS: 1.0000 | ORAL_TABLET | Freq: Two times a day (BID) | ORAL | Status: DC

## 2010-11-07 NOTE — Telephone Encounter (Signed)
Please call in

## 2010-11-07 NOTE — Telephone Encounter (Signed)
[facility]

## 2010-12-10 ENCOUNTER — Ambulatory Visit (INDEPENDENT_AMBULATORY_CARE_PROVIDER_SITE_OTHER): Payer: BC Managed Care – PPO | Admitting: Family Medicine

## 2010-12-10 ENCOUNTER — Encounter: Payer: Self-pay | Admitting: Family Medicine

## 2010-12-10 DIAGNOSIS — M109 Gout, unspecified: Secondary | ICD-10-CM | POA: Insufficient documentation

## 2010-12-10 DIAGNOSIS — M79609 Pain in unspecified limb: Secondary | ICD-10-CM

## 2010-12-10 MED ORDER — INDOMETHACIN 50 MG PO CAPS: 50.0000 mg | ORAL_CAPSULE | Freq: Three times a day (TID) | ORAL | Status: DC

## 2010-12-10 NOTE — Patient Instructions (Signed)
Gout Gout is an inflammatory condition (arthritis) caused by a buildup of uric acid crystals in the joints. Uric acid is a chemical that is normally present in the blood. Under some circumstances, uric acid can form into crystals in your joints. This causes joint redness, soreness, and swelling (inflammation). Repeat attacks are common. Over time, uric acid crystals can form into masses (tophi) near a joint, causing disfigurement. Gout is treatable and often preventable. CAUSES The disease begins with elevated levels of uric acid in the blood. Uric acid is produced by your body when it breaks down a naturally found substance called purines. This also happens when you eat certain foods such as meats and fish. Causes of an elevated uric acid level include:  Being passed down from parent to child (heredity).   Diseases that cause increased uric acid production (obesity, psoriasis, some cancers).   Excessive alcohol use.   Diet, especially diets rich in meat and seafood.   Medicines, including certain cancer-fighting drugs (chemotherapy), diuretics, and aspirin.   Chronic kidney disease. The kidneys are no longer able to remove uric acid well.   Problems with metabolism.  Conditions strongly associated with gout include:  Obesity.   High blood pressure.   High cholesterol.   Diabetes.  Not everyone with elevated uric acid levels gets gout. It is not understood why some people get gout and others do not. Surgery, joint injury, and eating too much of certain foods are some of the factors that can lead to gout. SYMPTOMS  An attack of gout comes on quickly. It causes intense pain with redness, swelling, and warmth in a joint.   Fever can occur.   Often, only one joint is involved. Certain joints are more commonly involved:   Base of the big toe.   Knee.   Ankle.   Wrist.   Finger.  Without treatment, an attack usually goes away in a few days to weeks. Between attacks, you usually  will not have symptoms, which is different from many other forms of arthritis. DIAGNOSIS Your caregiver will suspect gout based on your symptoms and exam. Removal of fluid from the joint (arthrocentesis) is done to check for uric acid crystals. Your caregiver will give you a medicine that numbs the area (local anesthetic) and use a needle to remove joint fluid for exam. Gout is confirmed when uric acid crystals are seen in joint fluid, using a special microscope. Sometimes, blood, urine, and X-ray tests are also used. TREATMENT There are 2 phases to gout treatment: treating the sudden onset (acute) attack and preventing attacks (prophylaxis). Treatment of an Acute Attack  Medicines are used. These include anti-inflammatory medicines or steroid medicines.   An injection of steroid medicine into the affected joint is sometimes necessary.   The painful joint is rested. Movement can worsen the arthritis.   You may use warm or cold treatments on painful joints, depending which works best for you.   Discuss the use of coffee, vitamin C, or cherries with your caregiver. These may be helpful treatment options.  Treatment to Prevent Attacks After the acute attack subsides, your caregiver may advise prophylactic medicine. These medicines either help your kidneys eliminate uric acid from your body or decrease your uric acid production. You may need to stay on these medicines for a very long time. The early phase of treatment with prophylactic medicine can be associated with an increase in acute gout attacks. For this reason, during the first few months of treatment, your caregiver  may also advise you to take medicines usually used for acute gout treatment. Be sure you understand your caregiver's directions. You should also discuss dietary treatment with your caregiver. Certain foods such as meats and fish can increase uric acid levels. Other foods such as dairy can decrease levels. Your caregiver can give  you a list of foods to avoid. HOME CARE INSTRUCTIONS  Do not take aspirin to relieve pain. This raises uric acid levels.   Only take over-the-counter or prescription medicines for pain, discomfort, or fever as directed by your caregiver.   Rest the joint as much as possible. When in bed, keep sheets and blankets off painful areas.   Keep the affected joint raised (elevated).   Use crutches if the painful joint is in your leg.   Drink enough water and fluids to keep your urine clear or pale yellow. This helps your body get rid of uric acid. Do not drink alcoholic beverages. They slow the passage of uric acid.   Follow your caregiver's dietary instructions. Pay careful attention to the amount of protein you eat. Your daily diet should emphasize fruits, vegetables, whole grains, and fat-free or low-fat milk products.   Maintain a healthy body weight.  SEEK MEDICAL CARE IF:  You have an oral temperature above 102 .   You develop diarrhea, vomiting, or any side effects from medicines.   You do not feel better in 24 hours, or you are getting worse.  SEEK IMMEDIATE MEDICAL CARE IF:  Your joint becomes suddenly more tender and you have:   Chills.   An oral temperature above 102, not controlled by medicine.  MAKE SURE YOU:  Understand these instructions.   Will watch your condition.   Will get help right away if you are not doing well or get worse.  Document Released: 04/18/2000 Document Re-Released: 10/09/2009 [facility] Patient Information 2011 [place], [place].

## 2010-12-10 NOTE — Progress Notes (Signed)
  Subjective:    Patient ID: Samantha Simmons, female    DOB: [birth year], 65 y.o.   MRN: [id number]  HPI  Gout No trauma No history of fx R great toe and dorsum of foot swollen, slightly red and MTP, 1st No known history of gout Pain at 1st and 2nd MTP Took a Vicodin that did not help at all  The PMH, PSH, Social History, Family History, Medications, and allergies have been reviewed in [facility], and have been updated if relevant.  Review of Systems REVIEW OF SYSTEMS  GEN: No fevers, chills. Nontoxic. Primarily MSK c/o today. MSK: Detailed in the HPI GI: tolerating PO intake without difficulty Neuro: No numbness, parasthesias, or tingling associated. Otherwise the pertinent positives of the ROS are noted above.      Objective:   Physical Exam   Physical Exam  Blood pressure 140/70, pulse 70, temperature 98.1 F (36.7 C), temperature source Oral, height 5\' 2"  (1.575 m), weight 189 lb (85.73 kg), SpO2 97.00%.  GEN: WDWN, NAD, Non-toxic, A & O x 3 HEENT: Atraumatic, Normocephalic. Neck supple. No masses, No LAD. Ears and Nose: No external deformity. EXTR: No c/c/e NEURO Normal gait.  PSYCH: Normally interactive. Conversant. Not depressed or anxious appearing.  Calm demeanor.   Marked tenderness at R 1st and 2nd MTP. Unable to move. Red slightly, moderate dorsal swelling. NT along MT shafts.       Assessment & Plan:   1. FOOT PAIN, RIGHT  indomethacin (INDOCIN) 50 MG capsule  2. Gout  indomethacin (INDOCIN) 50 MG capsule   C/w gout vs. CPPD  Treat acutely. Will call if not improving Fri. Would add colcrys then

## 2010-12-11 ENCOUNTER — Ambulatory Visit: Payer: BC Managed Care – PPO | Admitting: Family Medicine

## 2010-12-13 ENCOUNTER — Telehealth: Payer: Self-pay | Admitting: *Deleted

## 2010-12-13 NOTE — Telephone Encounter (Signed)
Pt called to let you know that, overall, the gout in her foot is better but the joint area on her toe is actually worse- very red with a knot, still very painful in that area.  She is asking if this is normal.

## 2010-12-13 NOTE — Telephone Encounter (Signed)
Discussed with patient. She tells me it is better overall. Swelling much better, swelling around all midfoot better. Virtually no pain. Some focal redness on medial foot -- in what sounds like the MTP area of 1st. Advised to cont indocin. Sounds like improving. Be careful and seek eval if fever or chills develop.

## 2010-12-24 ENCOUNTER — Telehealth: Payer: Self-pay | Admitting: *Deleted

## 2010-12-24 DIAGNOSIS — M79673 Pain in unspecified foot: Secondary | ICD-10-CM

## 2010-12-24 NOTE — Telephone Encounter (Signed)
Pt is having a lot of pain across the top of her right foot and she would like referral to a podiatrist.  She says the problem isn't gout, that has cleared up.  She says she thinks it's time for her to see a specialist.  She prefers to go to [facility].

## 2010-12-25 NOTE — Telephone Encounter (Signed)
Appt made with Dr Wynelle Cleveland for 12/25/2010 at 3pm, patient notified. MK

## 2011-01-02 ENCOUNTER — Other Ambulatory Visit: Payer: Self-pay | Admitting: *Deleted

## 2011-01-03 MED ORDER — HYDROCODONE-ACETAMINOPHEN 5-500 MG PO TABS: 1.0000 | ORAL_TABLET | Freq: Two times a day (BID) | ORAL | Status: DC

## 2011-01-03 NOTE — Telephone Encounter (Signed)
Please call in.  Thanks.   

## 2011-01-03 NOTE — Telephone Encounter (Signed)
Medication phoned to pharmacy.  

## 2011-01-15 ENCOUNTER — Other Ambulatory Visit: Payer: BC Managed Care – PPO

## 2011-01-16 ENCOUNTER — Other Ambulatory Visit: Payer: Self-pay | Admitting: Family Medicine

## 2011-01-16 ENCOUNTER — Other Ambulatory Visit (INDEPENDENT_AMBULATORY_CARE_PROVIDER_SITE_OTHER): Payer: BC Managed Care – PPO

## 2011-01-16 DIAGNOSIS — E7800 Pure hypercholesterolemia, unspecified: Secondary | ICD-10-CM

## 2011-01-16 DIAGNOSIS — E119 Type 2 diabetes mellitus without complications: Secondary | ICD-10-CM

## 2011-01-16 LAB — LIPID PANEL
Cholesterol: 236 mg/dL — ABNORMAL HIGH (ref 0–200)
HDL: 53.4 mg/dL (ref >39.00)
VLDL: 36.4 mg/dL (ref 0.0–40.0)

## 2011-01-16 LAB — HEMOGLOBIN A1C: Hgb A1c MFr Bld: 8.8 % — ABNORMAL HIGH (ref 4.6–6.5)

## 2011-01-28 ENCOUNTER — Ambulatory Visit: Payer: BC Managed Care – PPO | Admitting: Family Medicine

## 2011-01-28 ENCOUNTER — Encounter: Payer: Self-pay | Admitting: Family Medicine

## 2011-01-28 ENCOUNTER — Ambulatory Visit (INDEPENDENT_AMBULATORY_CARE_PROVIDER_SITE_OTHER): Payer: BC Managed Care – PPO | Admitting: Family Medicine

## 2011-01-28 VITALS — BP 128/84 | HR 64 | Temp 98.4°F | Wt 191.5 lb

## 2011-01-28 DIAGNOSIS — E785 Hyperlipidemia, unspecified: Secondary | ICD-10-CM

## 2011-01-28 DIAGNOSIS — I10 Essential (primary) hypertension: Secondary | ICD-10-CM

## 2011-01-28 DIAGNOSIS — E119 Type 2 diabetes mellitus without complications: Secondary | ICD-10-CM

## 2011-01-28 DIAGNOSIS — M109 Gout, unspecified: Secondary | ICD-10-CM

## 2011-01-28 MED ORDER — METFORMIN HCL 500 MG PO TABS: ORAL_TABLET | ORAL | Status: DC

## 2011-01-28 MED ORDER — COLCHICINE 0.6 MG PO TABS: 0.6000 mg | ORAL_TABLET | Freq: Every day | ORAL | Status: DC

## 2011-01-28 NOTE — Assessment & Plan Note (Signed)
Off HCTZ, continue colchicine for now.

## 2011-01-28 NOTE — Assessment & Plan Note (Signed)
Restart metformin, work on diet and weight, recheck A1c in 3 months.

## 2011-01-28 NOTE — Assessment & Plan Note (Signed)
Off HCTZ, not yet on ACE.  Will monitor and she'll try to lose weight. See DM2 plan.

## 2011-01-28 NOTE — Assessment & Plan Note (Signed)
No statin given hx, will work on diet and weight.

## 2011-01-28 NOTE — Patient Instructions (Signed)
I would get a flu shot each fall.   Check A1c in 3 months and come back for OV a few days after that.   I would take 500mg  of metformin a day for 1 week, increase by 1 pill a week, up to 4 pills.   Take care.

## 2011-01-28 NOTE — Progress Notes (Signed)
Diabetes:  Using medications without difficulties:no meds Hypoglycemic episodes:no Hyperglycemic episodes:yes, up to ~200 Feet problems:no Blood Sugars averaging: [phone] eye exam within last year: pending- she'll see Dr. Hazle Quant soon Had improved A1c with weight loss and came off metformin.  Inc in weight since then.   Hypertension:    Using medication without problems or lightheadedness: yes Chest pain with exertion:no Edema:no Short of breath:no Average home BPs: slightly elevated from today's value Other issues: h/o gout, now off HCTZ and needs refill on colchicine  Elevated Cholesterol: Using medications without problems: is nooff simvastatin, and aches improved at that point.   Muscle aches: as above Diet compliance: partial Other complaints:weight gain noted.   Labs d/w pt.   PMH and SH reviewed.   Vital signs, Meds and allergies reviewed.  ROS: See HPI.  Otherwise nontributory.   GEN: nad, alert and oriented HEENT: mucous membranes moist NECK: supple w/o LA CV: rrr.  PULM: ctab, no inc wob ABD: soft, +bs EXT: no edema SKIN: no acute rash  Diabetic foot exam: Normal inspection No skin breakdown No calluses  Normal DP pulses Normal sensation to light tough and monofilament Nails normal

## 2011-02-27 ENCOUNTER — Ambulatory Visit (INDEPENDENT_AMBULATORY_CARE_PROVIDER_SITE_OTHER): Payer: BC Managed Care – PPO

## 2011-02-27 DIAGNOSIS — Z23 Encounter for immunization: Secondary | ICD-10-CM

## 2011-03-02 ENCOUNTER — Encounter: Payer: Self-pay | Admitting: Family Medicine

## 2011-03-18 ENCOUNTER — Other Ambulatory Visit: Payer: Self-pay | Admitting: *Deleted

## 2011-03-18 NOTE — Telephone Encounter (Signed)
Last refill 01/03/2011.

## 2011-03-19 MED ORDER — HYDROCODONE-ACETAMINOPHEN 5-500 MG PO TABS: 1.0000 | ORAL_TABLET | Freq: Two times a day (BID) | ORAL | Status: DC

## 2011-03-19 NOTE — Telephone Encounter (Signed)
Medication phoned to pharmacy.  

## 2011-03-19 NOTE — Telephone Encounter (Signed)
Please call in

## 2011-03-31 ENCOUNTER — Telehealth: Payer: Self-pay | Admitting: *Deleted

## 2011-03-31 MED ORDER — METFORMIN HCL 1000 MG PO TABS: 1000.0000 mg | ORAL_TABLET | Freq: Two times a day (BID) | ORAL | Status: DC

## 2011-03-31 NOTE — Telephone Encounter (Signed)
Please call in metformin rx and we'll check A1c in late 12/12 or early 1/13.  Thanks.

## 2011-03-31 NOTE — Telephone Encounter (Signed)
Patient called stating that at her last office visit her Metformin 1000 mg was increased to two times a day. Patient states that her BP has been running 149-196. Patient states that she needs a new script to go to the pharmacy with these new directions. [facility]

## 2011-04-01 ENCOUNTER — Other Ambulatory Visit: Payer: Self-pay | Admitting: *Deleted

## 2011-04-01 MED ORDER — METFORMIN HCL 1000 MG PO TABS: 1000.0000 mg | ORAL_TABLET | Freq: Two times a day (BID) | ORAL | Status: DC

## 2011-04-01 NOTE — Telephone Encounter (Signed)
Medication sent to pharmacy  

## 2011-04-03 ENCOUNTER — Other Ambulatory Visit: Payer: Self-pay | Admitting: *Deleted

## 2011-04-03 NOTE — Telephone Encounter (Signed)
Opened in error, med already refilled.

## 2011-04-09 ENCOUNTER — Encounter: Payer: Self-pay | Admitting: Family Medicine

## 2011-04-09 DIAGNOSIS — IMO0001 Reserved for inherently not codable concepts without codable children: Secondary | ICD-10-CM | POA: Insufficient documentation

## 2011-04-14 ENCOUNTER — Other Ambulatory Visit: Payer: BC Managed Care – PPO

## 2011-04-17 ENCOUNTER — Ambulatory Visit: Payer: BC Managed Care – PPO | Admitting: Family Medicine

## 2011-05-20 ENCOUNTER — Other Ambulatory Visit (INDEPENDENT_AMBULATORY_CARE_PROVIDER_SITE_OTHER): Payer: BC Managed Care – PPO

## 2011-05-20 DIAGNOSIS — E119 Type 2 diabetes mellitus without complications: Secondary | ICD-10-CM

## 2011-05-20 LAB — HEMOGLOBIN A1C: Hgb A1c MFr Bld: 6.6 % — ABNORMAL HIGH (ref 4.6–6.5)

## 2011-05-22 ENCOUNTER — Other Ambulatory Visit: Payer: Self-pay | Admitting: Family Medicine

## 2011-05-22 ENCOUNTER — Ambulatory Visit (INDEPENDENT_AMBULATORY_CARE_PROVIDER_SITE_OTHER): Payer: BC Managed Care – PPO | Admitting: Family Medicine

## 2011-05-22 ENCOUNTER — Encounter: Payer: Self-pay | Admitting: Family Medicine

## 2011-05-22 DIAGNOSIS — M109 Gout, unspecified: Secondary | ICD-10-CM

## 2011-05-22 DIAGNOSIS — IMO0001 Reserved for inherently not codable concepts without codable children: Secondary | ICD-10-CM

## 2011-05-22 DIAGNOSIS — E119 Type 2 diabetes mellitus without complications: Secondary | ICD-10-CM

## 2011-05-22 MED ORDER — DULOXETINE HCL 60 MG PO CPEP: 60.0000 mg | ORAL_CAPSULE | Freq: Every day | ORAL | Status: DC

## 2011-05-22 MED ORDER — HYDROCODONE-ACETAMINOPHEN 5-500 MG PO TABS: 1.0000 | ORAL_TABLET | Freq: Two times a day (BID) | ORAL | Status: DC | PRN

## 2011-05-22 MED ORDER — METFORMIN HCL 1000 MG PO TABS: ORAL_TABLET | ORAL | Status: DC

## 2011-05-22 MED ORDER — COLCHICINE 0.6 MG PO TABS: 0.6000 mg | ORAL_TABLET | Freq: Every day | ORAL | Status: DC

## 2011-05-22 NOTE — Telephone Encounter (Signed)
Electronic refill request

## 2011-05-22 NOTE — Telephone Encounter (Signed)
Please clarify with pharmacy.  This should have gone through at [facility] Thursday.

## 2011-05-22 NOTE — Progress Notes (Signed)
Fibromyalgia.  More pain. Diffuse aches with trigger points.  HAs.  Fatigue.  Cymbalta 30mg , had helped prev.  Taking vicodin qhs ~5 out of 7 nights a week.    She's had family stress over the holidays.  Husband isn't not in counseling now.  This has been hard for her.  She continues in counseling with Gweneth Dimitri.  She's had mult disappointments from family members.    Diabetes:  Using medications without difficulties: now on 1500mg  a day total, but still had GI upset Hypoglycemic episodes: no Hyperglycemic episodes: no Feet problems:  R 1st MCP with gout flare, not ttp but still red and occ sore.  Blood Sugars averaging: 100-150 A1c improved to 6.6  Prev with neg stress test per Dr. Eldridge Dace 2012  PMH and [facility] reviewed  Meds, vitals, and allergies reviewed.   ROS: See HPI.  Otherwise negative.    GEN: nad, alert and oriented HEENT: mucous membranes moist NECK: supple w/o LA CV: rrr. PULM: ctab, no inc wob ABD: soft, +bs EXT: no edema SKIN: no acute rash  Diabetic foot exam: Normal inspection except for mild erythema on R 1st MCP No skin breakdown No calluses  Normal DP pulses Normal sensation to light touch and monofilament Nails normal

## 2011-05-22 NOTE — Patient Instructions (Signed)
Cut back to 1/2 tab of metformin twice a day (or once a day if needed).  Take the colchicine as needed, daily. Increase the cymbalta to 60mg  a day and call me with an update in a few weeks.   Recheck labs late May, early June with OV after that.

## 2011-05-23 ENCOUNTER — Encounter: Payer: Self-pay | Admitting: Family Medicine

## 2011-05-23 NOTE — Assessment & Plan Note (Signed)
Continue prn colchicine and check urate later in 2013.

## 2011-05-23 NOTE — Telephone Encounter (Signed)
Duplication

## 2011-05-23 NOTE — Assessment & Plan Note (Signed)
Inc cymbalta to 60mg  and she'll call back with update.

## 2011-05-23 NOTE — Assessment & Plan Note (Signed)
Improved, continue work on diet and exercise.  >25 min spent with face to face with patient, >50% counseling and/or coordinating care.

## 2011-05-26 ENCOUNTER — Telehealth: Payer: Self-pay | Admitting: *Deleted

## 2011-05-26 NOTE — Telephone Encounter (Signed)
A fax concerning reevaluating treatment options for Gastroesophageal Reflux Disease.  Form is in your in box if you decide to respond.

## 2011-05-27 NOTE — Telephone Encounter (Signed)
I would continue as is.  She had been taking this PRN, my understanding was that she had tried to taper in past, ie take only when needed. Form shredded.

## 2011-06-13 ENCOUNTER — Telehealth: Payer: Self-pay | Admitting: *Deleted

## 2011-06-13 NOTE — Telephone Encounter (Signed)
I called her back and we discussed options.  She would like to continue as is and she'll let me know if the low mood continues to be a problem.  This is reasonable.

## 2011-06-13 NOTE — Telephone Encounter (Signed)
Patient saw you on January 17th and her cymbalta dose was increased patient was told to call and tell how thing were going and she says her pain from fibromyalgia is better but, her mood in not. Patient says she doesn't follow up until may and if you want to change anything to give her a call.

## 2011-06-18 ENCOUNTER — Other Ambulatory Visit: Payer: Self-pay | Admitting: Family Medicine

## 2011-06-18 MED ORDER — DULOXETINE HCL 60 MG PO CPEP: 60.0000 mg | ORAL_CAPSULE | Freq: Every day | ORAL | Status: DC

## 2011-06-18 NOTE — Telephone Encounter (Signed)
Sent!

## 2011-06-18 NOTE — Telephone Encounter (Addendum)
Rx refill correction Cymabalta 60 mg needs to be increased to #90 instead of #30 . Pt says less expensive if she gets more pills. [facility] [address]

## 2011-07-28 ENCOUNTER — Other Ambulatory Visit: Payer: Self-pay | Admitting: *Deleted

## 2011-07-28 MED ORDER — HYDROCODONE-ACETAMINOPHEN 5-500 MG PO TABS: 1.0000 | ORAL_TABLET | Freq: Two times a day (BID) | ORAL | Status: DC | PRN

## 2011-07-28 NOTE — Telephone Encounter (Signed)
Please call in

## 2011-07-28 NOTE — Telephone Encounter (Signed)
Received faxed refill request from pharmacy. Is it okay to refill medication? 

## 2011-07-29 NOTE — Telephone Encounter (Signed)
Medication phoned to pharmacy.  

## 2011-07-30 ENCOUNTER — Other Ambulatory Visit: Payer: Self-pay

## 2011-07-30 NOTE — Telephone Encounter (Signed)
If the other rx hasn't been picked up, then change the amount to #60 with 1 refill.  Thanks.

## 2011-07-30 NOTE — Telephone Encounter (Signed)
[phone] for Hydrocodone APAP 5-500 mg for 90 day supply. On 07/28/11 [id number] #30 x 1 refill.Please advise.

## 2011-07-31 NOTE — Telephone Encounter (Signed)
Patient has not picked up Rx.  Quantity was changed to #60 with 1 RF.

## 2011-09-24 ENCOUNTER — Other Ambulatory Visit (INDEPENDENT_AMBULATORY_CARE_PROVIDER_SITE_OTHER): Payer: BC Managed Care – PPO

## 2011-09-24 DIAGNOSIS — E119 Type 2 diabetes mellitus without complications: Secondary | ICD-10-CM

## 2011-09-24 DIAGNOSIS — M109 Gout, unspecified: Secondary | ICD-10-CM

## 2011-09-24 LAB — COMPREHENSIVE METABOLIC PANEL
BUN: 18 mg/dL (ref 6–23)
CO2: 29 mEq/L (ref 19–32)
Calcium: 9.4 mg/dL (ref 8.4–10.5)
Chloride: 102 mEq/L (ref 96–112)
Creatinine, Ser: 0.9 mg/dL (ref 0.4–1.2)
GFR: 66.21 mL/min (ref >60.00)
Glucose, Bld: 156 mg/dL — ABNORMAL HIGH (ref 70–99)
Total Bilirubin: 1 mg/dL (ref 0.3–1.2)

## 2011-09-24 LAB — LIPID PANEL
Cholesterol: 220 mg/dL — ABNORMAL HIGH (ref 0–200)
Total CHOL/HDL Ratio: 4
Triglycerides: 227 mg/dL — ABNORMAL HIGH (ref 0.0–149.0)

## 2011-09-24 LAB — MICROALBUMIN / CREATININE URINE RATIO
Creatinine,U: 255.9 mg/dL
Microalb Creat Ratio: 35.1 mg/g — ABNORMAL HIGH (ref 0.0–30.0)
Microalb, Ur: 89.9 mg/dL — ABNORMAL HIGH (ref 0.0–1.9)

## 2011-09-24 LAB — URIC ACID: Uric Acid, Serum: 7.4 mg/dL — ABNORMAL HIGH (ref 2.4–7.0)

## 2011-09-24 LAB — HEMOGLOBIN A1C: Hgb A1c MFr Bld: 7.3 % — ABNORMAL HIGH (ref 4.6–6.5)

## 2011-09-24 LAB — LDL CHOLESTEROL, DIRECT: Direct LDL: 136.7 mg/dL

## 2011-09-25 ENCOUNTER — Other Ambulatory Visit: Payer: BC Managed Care – PPO

## 2011-09-29 ENCOUNTER — Other Ambulatory Visit: Payer: Self-pay | Admitting: Family Medicine

## 2011-09-30 ENCOUNTER — Telehealth: Payer: Self-pay

## 2011-09-30 NOTE — Telephone Encounter (Signed)
Pt traveling and seen at [facility]; Gust Brooms [phone].?lt foot injury or cellulitis; last week pt brought by disc;  front desk was to put on Dr Lianne Bushy desk; pt has appt 10/02/11. Pt will contact [facility] have notes faxed to our office.Pt doing well now, still tenderness but swelling almost gone. For Dr Jacqlyn Krauss.

## 2011-09-30 NOTE — Telephone Encounter (Signed)
Noted, will d/w pt at OV. 

## 2011-10-02 ENCOUNTER — Ambulatory Visit (INDEPENDENT_AMBULATORY_CARE_PROVIDER_SITE_OTHER): Payer: BC Managed Care – PPO | Admitting: Family Medicine

## 2011-10-02 ENCOUNTER — Encounter: Payer: Self-pay | Admitting: Family Medicine

## 2011-10-02 VITALS — BP 130/90 | HR 75 | Temp 97.9°F | Wt 179.0 lb

## 2011-10-02 DIAGNOSIS — E119 Type 2 diabetes mellitus without complications: Secondary | ICD-10-CM

## 2011-10-02 DIAGNOSIS — M79673 Pain in unspecified foot: Secondary | ICD-10-CM | POA: Insufficient documentation

## 2011-10-02 DIAGNOSIS — L609 Nail disorder, unspecified: Secondary | ICD-10-CM

## 2011-10-02 DIAGNOSIS — L608 Other nail disorders: Secondary | ICD-10-CM

## 2011-10-02 DIAGNOSIS — L0291 Cutaneous abscess, unspecified: Secondary | ICD-10-CM

## 2011-10-02 DIAGNOSIS — L0390 Cellulitis, unspecified: Secondary | ICD-10-CM

## 2011-10-02 DIAGNOSIS — E785 Hyperlipidemia, unspecified: Secondary | ICD-10-CM

## 2011-10-02 MED ORDER — METFORMIN HCL 1000 MG PO TABS: 500.0000 mg | ORAL_TABLET | Freq: Every day | ORAL | Status: DC

## 2011-10-02 MED ORDER — COLCHICINE 0.6 MG PO TABS: 0.6000 mg | ORAL_TABLET | Freq: Every day | ORAL | Status: DC

## 2011-10-02 MED ORDER — LISINOPRIL 5 MG PO TABS: 5.0000 mg | ORAL_TABLET | Freq: Every day | ORAL | Status: DC

## 2011-10-02 NOTE — Assessment & Plan Note (Signed)
Presumed cause of pain on R foot, restart colchicine and f/u prn.

## 2011-10-02 NOTE — Assessment & Plan Note (Signed)
Will address after control of DM2 and microalb.  D/w pt.

## 2011-10-02 NOTE — Progress Notes (Signed)
L midfoot pain- happened during trip to [place].  Is better after treatment for cellulitis.  It wasn't typical for gout.  I reviewed disc of xrays and report.  No acute fracture.  Now with some achilles tenderness on L foot.    R MTP with some redness.  H/o gout, typical appearance for gout.  Is tender.    DM2.  126 this AM.  Was higher on vacation that lasted 6 weeks.  Diet was altered.  A1c 7.3.  Reviewed with patient.  MALB was elevated, not yet on ACE.  Has diarrhea, likely from metformin.    Also with nail splitting on hands, not feet.  No hair changes.    HLD.  Lipids are elevated along with mild transaminitis.  Not on statin.  We discussed.    PMH and SH reviewed  ROS: See HPI, otherwise noncontributory.  Meds, vitals, and allergies reviewed.   GEN: nad, alert and oriented NECK: supple w/o LA CV: rrr.  PULM: ctab, no inc wob ABD: soft, +bs EXT: no edema SKIN: no acute rash but fingernail splitting noted  Diabetic foot exam: Normal inspection except for erythema at R 1st MTP No skin breakdown No calluses  Normal DP pulses Normal sensation to light touch and monofilament Nails normal Minimally ttp at the insertion of L achilles.  L foot not ttp o/w.

## 2011-10-02 NOTE — Assessment & Plan Note (Signed)
Resolved cellulitis.  Will need to stretch for her achilles.  F/u prn.

## 2011-10-02 NOTE — Assessment & Plan Note (Signed)
Add on biotin.

## 2011-10-02 NOTE — Patient Instructions (Signed)
Cut back to 500mg  of metformin.  If you still have diarrhea, stop it totally.  Recheck A1c in 6 months before a visit.  Take colchicine for gout and stretch your achilles gently as we discussed.  Start taking lisinopril 5mg  a day.  Take OTC biotin once a day, least a day, for your nails.

## 2011-10-02 NOTE — Assessment & Plan Note (Addendum)
Stop metformin if she can't tolerate 500mg  a day.  This is likely causing the diarrhea.  She'll notify me if sugar is greatly increased in meantime.  Plan on recheck in 6 months, sooner prn.  Add low dose ACE due to MALB.  D/w pt.  She understood.

## 2011-11-03 ENCOUNTER — Other Ambulatory Visit: Payer: Self-pay | Admitting: *Deleted

## 2011-11-03 MED ORDER — METFORMIN HCL 1000 MG PO TABS: 500.0000 mg | ORAL_TABLET | Freq: Every day | ORAL | Status: DC

## 2011-11-10 ENCOUNTER — Other Ambulatory Visit: Payer: Self-pay | Admitting: *Deleted

## 2011-11-10 MED ORDER — METFORMIN HCL 1000 MG PO TABS: 500.0000 mg | ORAL_TABLET | Freq: Every day | ORAL | Status: DC

## 2011-11-10 NOTE — Telephone Encounter (Signed)
Received a faxed note from the pharmacy regarding a dose change on patient's Metformin requesting a new script. Called and spoke to patient and was advised that she is taking the Metformin as listed on the medication sheet. Patient wanted a 90 day supply instead of a 30 day. Prescription refill sent to the pharmacy as requested.

## 2011-11-10 NOTE — Telephone Encounter (Signed)
Noted. Thanks.

## 2011-11-13 ENCOUNTER — Telehealth: Payer: Self-pay | Admitting: *Deleted

## 2011-11-13 MED ORDER — METFORMIN HCL 500 MG PO TABS: 500.0000 mg | ORAL_TABLET | Freq: Every day | ORAL | Status: DC

## 2011-11-13 NOTE — Telephone Encounter (Signed)
Sent!

## 2011-11-13 NOTE — Telephone Encounter (Signed)
Patient would like rx for 500 mg of Metformin instead of 1000 mg, pt doesn't want to continue to cut in half, rx directions is take 1/2 tab once daily of the 100 mg. Please advise

## 2011-11-13 NOTE — Telephone Encounter (Signed)
Patient notified as instructed by telephone. 

## 2011-12-17 ENCOUNTER — Telehealth: Payer: Self-pay | Admitting: Family Medicine

## 2011-12-17 NOTE — Telephone Encounter (Signed)
Pt is needing refill on her Omeprazole and Vicodin pt uses  [facility] [address]

## 2011-12-18 MED ORDER — OMEPRAZOLE 20 MG PO CPDR: 20.0000 mg | DELAYED_RELEASE_CAPSULE | Freq: Every day | ORAL | Status: DC

## 2011-12-18 MED ORDER — HYDROCODONE-ACETAMINOPHEN 5-500 MG PO TABS: 1.0000 | ORAL_TABLET | Freq: Two times a day (BID) | ORAL | Status: DC | PRN

## 2011-12-18 NOTE — Telephone Encounter (Signed)
Omeprazole sent, please call in vicodin.  Thanks.

## 2011-12-18 NOTE — Telephone Encounter (Signed)
Medication phoned to pharmacy.  

## 2011-12-22 ENCOUNTER — Encounter: Payer: Self-pay | Admitting: Family Medicine

## 2011-12-22 ENCOUNTER — Ambulatory Visit (INDEPENDENT_AMBULATORY_CARE_PROVIDER_SITE_OTHER): Payer: BC Managed Care – PPO | Admitting: Family Medicine

## 2011-12-22 VITALS — BP 156/90 | HR 84 | Temp 98.0°F | Wt 180.0 lb

## 2011-12-22 DIAGNOSIS — IMO0001 Reserved for inherently not codable concepts without codable children: Secondary | ICD-10-CM

## 2011-12-22 DIAGNOSIS — M79609 Pain in unspecified limb: Secondary | ICD-10-CM

## 2011-12-22 DIAGNOSIS — M791 Myalgia, unspecified site: Secondary | ICD-10-CM | POA: Insufficient documentation

## 2011-12-22 DIAGNOSIS — M79673 Pain in unspecified foot: Secondary | ICD-10-CM

## 2011-12-22 MED ORDER — HYDROCODONE-ACETAMINOPHEN 5-500 MG PO TABS: 1.0000 | ORAL_TABLET | Freq: Two times a day (BID) | ORAL | Status: DC | PRN

## 2011-12-22 MED ORDER — OMEPRAZOLE 20 MG PO TBEC: DELAYED_RELEASE_TABLET | ORAL | Status: DC

## 2011-12-22 MED ORDER — METHOCARBAMOL 500 MG PO TABS: 500.0000 mg | ORAL_TABLET | Freq: Four times a day (QID) | ORAL | Status: AC

## 2011-12-22 NOTE — Assessment & Plan Note (Signed)
Achilles tendonitis flare.  Use heel lift and vicodin prn. F/u prn.  No need to reimage today.

## 2011-12-22 NOTE — Patient Instructions (Addendum)
I would use heel lifts and stretch your left calf.  Use the robaxin as needed for the upper back pain.

## 2011-12-22 NOTE — Progress Notes (Signed)
Foot and ankle pain.  At distal achilles.  Known achilles' spurring.  No trauma. Worse after walking.  Recent return of sx.  Not in shoes with heel or using heel lift yet.    R side of neck, "pinched nerve"  After moving boxes last week. No help with massage, heat.  No pain in arm or hand but now comes over the clavicle anteriorly. Pain is staying about the same, worse with certain movements.    Meds, vitals, and allergies reviewed.   ROS: See HPI.  Otherwise, noncontributory.  nad ncat Neck supple and rom wnl but some muscle pain along R trap with movement of head to L side R trap ttp  No midline pain in neck or upper back No rash rrr ctab L ankle with normal inspection, mortise intact and not ttp except for distal achilles

## 2011-12-22 NOTE — Assessment & Plan Note (Signed)
She uses 1-2 vicodin a day at baseline.  Less of a need on cymbalta.  Continue current meds.  vicodin rx refilled.

## 2011-12-22 NOTE — Assessment & Plan Note (Signed)
No cuff findings/impingement on exam.  Likely trap spasm with irritation of muscles located nearby.  Use robaxin, heat, stretching and vicodin.  F/u prn.

## 2012-01-22 ENCOUNTER — Telehealth: Payer: Self-pay | Admitting: Family Medicine

## 2012-01-22 NOTE — Telephone Encounter (Signed)
Caller: [name]/Patient; Patient Name: Samantha Simmons; PCP: Crawford Givens [name]) [facility]); Best Callback Phone Number: [phone] Calling about waking up with itchy raised bright red spots on groin, legs, and arm on 01/20/12. The night before she was outside roasting marshmellows. She doesn't remember getting bit by any insects. She used Benedryl, Baking Soda soaks, Aveno, Aloe Vera. Her chest felt heavy and stomach hurt at 1930 on 01/21/12 -took Benedryl 50mg s at 2100 and slept good. Today rash/bites still itchy-01/22/12. Triage and Care advice per Bites and Stings and Rash Protocols and appointment advised within 24 hours for"generalized rash AND not responding to 8 hours of home care".  She is going continue Benedryl today and try some Hydrocortisone Cream will call on 01/23/12 if no improvement.

## 2012-01-23 ENCOUNTER — Ambulatory Visit (INDEPENDENT_AMBULATORY_CARE_PROVIDER_SITE_OTHER): Payer: BC Managed Care – PPO | Admitting: Family Medicine

## 2012-01-23 ENCOUNTER — Encounter: Payer: Self-pay | Admitting: Family Medicine

## 2012-01-23 VITALS — BP 130/86 | HR 70 | Temp 98.1°F | Wt 179.0 lb

## 2012-01-23 DIAGNOSIS — R21 Rash and other nonspecific skin eruption: Secondary | ICD-10-CM

## 2012-01-23 MED ORDER — TRIAMCINOLONE ACETONIDE 0.1 % EX CREA: TOPICAL_CREAM | Freq: Three times a day (TID) | CUTANEOUS | Status: DC

## 2012-01-23 NOTE — Patient Instructions (Addendum)
Use the cream on the affected areas and this should get better. Take benadyrl in the meantime.

## 2012-01-23 NOTE — Telephone Encounter (Signed)
Agreed, schedule for today if not improved.  Thanks.

## 2012-01-23 NOTE — Telephone Encounter (Signed)
Has appt scheduled today at 2:30.

## 2012-01-25 DIAGNOSIS — R21 Rash and other nonspecific skin eruption: Secondary | ICD-10-CM | POA: Insufficient documentation

## 2012-01-25 NOTE — Progress Notes (Signed)
Red itchy lesions on BLE over last few days and failing tx with OTC meds. No other systemic sx.  Feeling well.  No know trigger, but has been in the yard recently.  No others with similar sx.  Meds, vitals, and allergies reviewed.   ROS: See HPI.  Otherwise, noncontributory.  Nad Skin with irregular distribution of nonlinear, nondermatomal, nonblanching red macular lesions w/o scale/crust/visicle on the legs but not the feet/palms/interdigital spaces

## 2012-01-25 NOTE — Assessment & Plan Note (Signed)
Presumed chigger bites, would use topical TAC and f/u prn.  Doesn't appear infected.  F/u prn.

## 2012-01-29 ENCOUNTER — Telehealth: Payer: Self-pay | Admitting: Family Medicine

## 2012-01-29 ENCOUNTER — Encounter: Payer: Self-pay | Admitting: Internal Medicine

## 2012-01-29 DIAGNOSIS — Z1231 Encounter for screening mammogram for malignant neoplasm of breast: Secondary | ICD-10-CM

## 2012-01-29 NOTE — Telephone Encounter (Signed)
Mammogram ordered. Thanks.

## 2012-01-29 NOTE — Telephone Encounter (Signed)
pts last mammogram at least 2 years ago; pt not having any problems but wants mammo done in [place]; pt scheduled f/u 03/09/12.Please advise.

## 2012-01-29 NOTE — Telephone Encounter (Signed)
The pt is hoping to get a mamogram sometime soon.  She is hoping she can get an order for this.  Thanks!

## 2012-02-18 ENCOUNTER — Encounter: Payer: Self-pay | Admitting: Family Medicine

## 2012-02-25 ENCOUNTER — Other Ambulatory Visit: Payer: Self-pay | Admitting: Family Medicine

## 2012-02-25 ENCOUNTER — Ambulatory Visit
Admission: RE | Admit: 2012-02-25 | Discharge: 2012-02-25 | Disposition: A | Discharge status: Home / Self Care | Payer: BC Managed Care – PPO | Source: Ambulatory Visit | Attending: Family Medicine | Admitting: Family Medicine

## 2012-02-25 DIAGNOSIS — E119 Type 2 diabetes mellitus without complications: Secondary | ICD-10-CM

## 2012-02-25 DIAGNOSIS — M109 Gout, unspecified: Secondary | ICD-10-CM

## 2012-02-25 DIAGNOSIS — Z1231 Encounter for screening mammogram for malignant neoplasm of breast: Secondary | ICD-10-CM

## 2012-02-26 ENCOUNTER — Inpatient Hospital Stay: Admit: 2012-02-26 | Discharge: 2012-02-26 | Disposition: A | Discharge status: Home / Self Care | Payer: Self-pay

## 2012-03-01 ENCOUNTER — Encounter: Payer: Self-pay | Admitting: *Deleted

## 2012-03-03 ENCOUNTER — Ambulatory Visit (INDEPENDENT_AMBULATORY_CARE_PROVIDER_SITE_OTHER): Payer: BC Managed Care – PPO

## 2012-03-03 ENCOUNTER — Other Ambulatory Visit (INDEPENDENT_AMBULATORY_CARE_PROVIDER_SITE_OTHER): Payer: BC Managed Care – PPO

## 2012-03-03 ENCOUNTER — Ambulatory Visit: Payer: BC Managed Care – PPO

## 2012-03-03 DIAGNOSIS — E785 Hyperlipidemia, unspecified: Secondary | ICD-10-CM

## 2012-03-03 DIAGNOSIS — M109 Gout, unspecified: Secondary | ICD-10-CM

## 2012-03-03 DIAGNOSIS — Z23 Encounter for immunization: Secondary | ICD-10-CM

## 2012-03-03 DIAGNOSIS — E119 Type 2 diabetes mellitus without complications: Secondary | ICD-10-CM

## 2012-03-03 LAB — COMPREHENSIVE METABOLIC PANEL
ALT: 71 U/L — ABNORMAL HIGH (ref 0–35)
AST: 78 U/L — ABNORMAL HIGH (ref 0–37)
Albumin: 3.8 g/dL (ref 3.5–5.2)
Alkaline Phosphatase: 144 U/L — ABNORMAL HIGH (ref 39–117)
Calcium: 9.2 mg/dL (ref 8.4–10.5)
Chloride: 100 mEq/L (ref 96–112)
Potassium: 3.8 mEq/L (ref 3.5–5.1)

## 2012-03-03 LAB — HEMOGLOBIN A1C: Hgb A1c MFr Bld: 8.5 % — ABNORMAL HIGH (ref 4.6–6.5)

## 2012-03-03 LAB — LIPID PANEL
HDL: 50.7 mg/dL (ref >39.00)
Total CHOL/HDL Ratio: 5

## 2012-03-03 LAB — URIC ACID: Uric Acid, Serum: 5.3 mg/dL (ref 2.4–7.0)

## 2012-03-03 LAB — TSH: TSH: 1.28 u[IU]/mL (ref 0.35–5.50)

## 2012-03-03 LAB — MICROALBUMIN / CREATININE URINE RATIO: Microalb Creat Ratio: 128.9 mg/g — ABNORMAL HIGH (ref 0.0–30.0)

## 2012-03-04 ENCOUNTER — Encounter: Payer: Self-pay | Admitting: Family Medicine

## 2012-03-05 ENCOUNTER — Other Ambulatory Visit: Payer: BC Managed Care – PPO

## 2012-03-09 ENCOUNTER — Ambulatory Visit (INDEPENDENT_AMBULATORY_CARE_PROVIDER_SITE_OTHER): Payer: BC Managed Care – PPO | Admitting: Family Medicine

## 2012-03-09 ENCOUNTER — Encounter: Payer: Self-pay | Admitting: Family Medicine

## 2012-03-09 VITALS — BP 128/86 | HR 80 | Temp 98.0°F | Resp 20 | Ht 62.0 in | Wt 182.0 lb

## 2012-03-09 DIAGNOSIS — E119 Type 2 diabetes mellitus without complications: Secondary | ICD-10-CM

## 2012-03-09 DIAGNOSIS — F329 Major depressive disorder, single episode, unspecified: Secondary | ICD-10-CM

## 2012-03-09 MED ORDER — DULOXETINE HCL 30 MG PO CPEP: 30.0000 mg | ORAL_CAPSULE | Freq: Every day | ORAL | Status: DC

## 2012-03-09 MED ORDER — LOSARTAN POTASSIUM 50 MG PO TABS: 50.0000 mg | ORAL_TABLET | Freq: Every day | ORAL | Status: DC

## 2012-03-09 NOTE — Patient Instructions (Signed)
 visit in 3 months.  Work on [employer] and recheck A1c before the next visit.   Increase cymbalta to 60 + 30 mg a day.  See if that helps.  Increase the counseling to 2 times a month. When the amlodipine runs out, switch to losartan 50mg  a day.

## 2012-03-09 NOTE — Progress Notes (Signed)
Diabetes:  Using medications without difficulties:  Yes, but can only tolerate 500mg  of metformin a day.   Hypoglycemic episodes: occ, if prolonged fasting Hyperglycemic episodes:  See below Feet problems: no complaints.  191 this AM fasting, had been 174 on recent AM check Diet discussed.  "We're not eating healthy."  Snacking with 1 real meal a day.  Not exercising.  Discussed.  She has plans to start MALB up.  Discussed.  ACE intolerant.  Not on ARB yet.    Mood is lower.  Fatigued.  Feels sad.  Grandkids aren't around and that may play a role. Fall is usually her favorite time of year.  No Si/Hi.  She is sleeping more than normal.  Still on cymbalta 60mg  a day. Still in counseling.   Mild inc in LFTs noted, likely due to weight gain, DM2, fatty liver.  H/o HBV but this was in distant past and she had cleared this.  No jaundice, vomiting, edema.  Off statin (with inc in lipids noted, discussed).  PMH and SH reviewed  Meds, vitals, and allergies reviewed.   ROS: See HPI.  Otherwise negative.    GEN: nad, alert and oriented HEENT: mucous membranes moist NECK: supple w/o LA CV: rrr. PULM: ctab, no inc wob ABD: soft, +bs, no ttp, no rebound EXT: no edema SKIN: no acute rash  Diabetic foot exam: Normal inspection No skin breakdown No calluses  Normal DP pulses Normal sensation to light touch and monofilament Nails normal

## 2012-03-10 ENCOUNTER — Encounter: Payer: Self-pay | Admitting: Family Medicine

## 2012-03-10 DIAGNOSIS — F32A Depression, unspecified: Secondary | ICD-10-CM | POA: Insufficient documentation

## 2012-03-10 DIAGNOSIS — F329 Major depressive disorder, single episode, unspecified: Secondary | ICD-10-CM | POA: Insufficient documentation

## 2012-03-10 NOTE — Assessment & Plan Note (Signed)
Inc cymbalta to 90mg  a day, inc exercise and she'll think about getting more bright lights in the house, esp for AM use.  No si/hi, okay for outpatient f/u.

## 2012-03-10 NOTE — Assessment & Plan Note (Signed)
Will try to minimize med changes.  Will work on diet, exercise, weight.  Recheck A1c in 3 months.  May need to add second agent if not improved.  Will add on ARB when she is running out of amlodipine, should be 1-2 weeks before next OV in 3 months.  Will f/u BP and MALB at that point.  We didn't change these meds today as we are increasing cymbalta.

## 2012-03-10 NOTE — Assessment & Plan Note (Signed)
Will follow, will need to consider further w/u if persistent or increasing.  Work on [employer] in meantime as this is likely from fatty liver.

## 2012-03-11 ENCOUNTER — Encounter: Payer: Self-pay | Admitting: Family Medicine

## 2012-04-02 ENCOUNTER — Other Ambulatory Visit: Payer: BC Managed Care – PPO

## 2012-04-05 ENCOUNTER — Telehealth: Payer: Self-pay | Admitting: Family Medicine

## 2012-04-05 ENCOUNTER — Encounter: Payer: Self-pay | Admitting: Family Medicine

## 2012-04-05 ENCOUNTER — Ambulatory Visit (INDEPENDENT_AMBULATORY_CARE_PROVIDER_SITE_OTHER): Payer: BC Managed Care – PPO | Admitting: Family Medicine

## 2012-04-05 VITALS — BP 128/80 | HR 78 | Temp 98.5°F | Wt 180.0 lb

## 2012-04-05 DIAGNOSIS — J019 Acute sinusitis, unspecified: Secondary | ICD-10-CM

## 2012-04-05 DIAGNOSIS — F329 Major depressive disorder, single episode, unspecified: Secondary | ICD-10-CM

## 2012-04-05 MED ORDER — AMOXICILLIN-POT CLAVULANATE 875-125 MG PO TABS: 1.0000 | ORAL_TABLET | Freq: Two times a day (BID) | ORAL | Status: DC

## 2012-04-05 NOTE — Progress Notes (Signed)
Her mood is still low but it has been difficult with the recent holiday and family considerations and she would like to continue higher dose of cymbalta for now. If not better, she'll notify us.    duration of symptoms: 10 days Rhinorrhea: yes, recently started Congestion: yes ear pain:yes, R>L sore throat:yes Cough:yes, with sputum Myalgias: yes, more than normal other concerns: she thought her chest was tight.  She is fatigued.  Voice is hoarse.  Dec in appetite.  She's drinking well.   She's been taking alka seltzer/OTC cold/cough meds.    ROS: See HPI.  Otherwise negative.    Meds, vitals, and allergies reviewed.   GEN: nad, alert and oriented HEENT: mucous membranes moist, TM w/o erythema, nasal epithelium injected, OP with cobblestoning, R max sinus ttp NECK: supple w/o LA CV: rrr. PULM: ctab, no inc wob

## 2012-04-05 NOTE — Patient Instructions (Addendum)
Start the antibiotics today and try to get some rest.  Drink plenty of fluids and this should improve.  Take care.

## 2012-04-05 NOTE — Telephone Encounter (Signed)
Patient Information:  Caller Name: Sharian  Phone: [phone]  Patient: Samantha, Simmons  Gender: Female  DOB: Jul 23, 1945  Age: 66 Years  PCP: Crawford Givens [name]) [facility])   Symptoms  Reason For Call & Symptoms: Preductive cough, sinus drainage, chest  congestion, fever  Reviewed Health History In EMR: Yes  Reviewed Medications In EMR: Yes  Reviewed Allergies In EMR: Yes  Reviewed Surgeries / Procedures: Yes  Date of Onset of Symptoms: 04/25/2012  Treatments Tried: Stay hydrated, alka Seltzer Cold and  Flu and sleeping  Treatments Tried Worked: No  Guideline(s) Used:  Colds  Cough  Disposition Per Guideline:   See Today in Office  Reason For Disposition Reached:   Severe coughing spells (e.g., whooping sound after coughing, vomiting after coughing)  Advice Given:  Home care and call back instructions given.  Office Follow Up:  Does the office need to follow up with this patient?: No  Instructions For The Office: N/A  Appointment Scheduled:  04/05/2012 12:15:00

## 2012-04-06 DIAGNOSIS — J019 Acute sinusitis, unspecified: Secondary | ICD-10-CM | POA: Insufficient documentation

## 2012-04-06 NOTE — Assessment & Plan Note (Signed)
Her mood is still low but it has been difficult with the recent holiday and family considerations and she would like to continue higher dose of cymbalta for now. If not better, she'll notify us.

## 2012-04-06 NOTE — Assessment & Plan Note (Signed)
Augmentin, fluids and rest.  She agrees.  Nontoxic.  F/u prn.

## 2012-04-20 ENCOUNTER — Ambulatory Visit: Payer: BC Managed Care – PPO | Admitting: Family Medicine

## 2012-04-22 ENCOUNTER — Telehealth: Payer: Self-pay | Admitting: Family Medicine

## 2012-04-22 NOTE — Telephone Encounter (Signed)
Patient Information:  Caller Name: Kyannah  Phone: [phone]  Patient: Samantha Simmons, Samantha Simmons  Gender: Female  DOB: 09-01-1945  Age: 66 Years  PCP: Crawford Givens [name]) [facility])  Office Follow Up:  Does the office need to follow up with this patient?: Yes  Instructions For The Office: Patient has appt tomorrow 04/23/12 at 09:30.  Has Silvadene Ointment, Benadryl and cool compresses. Declined ER.  RN Note:  Spoke with poison Control- Advised to be seen by physician. Allergic- VS- Chemical Reaction.  Patient has Silvadene Ointment at home for use. Benadry 50mg  po every 6 hours for itching.  Patient does not want to be seen in the ER. Appt scheduled for tomorrow 04/23/12 0930  Symptoms  Reason For Call & Symptoms: Patient went and had her hair colored yesterday 04/21/12.  Done in a Salon.  She states this morning , 04/22/12, she basically she has a chemical burn in her hair extending and 1 inch from the hairline. +redness , itching.  Patient is uncomfortable but not in pain. "Really bad sunburn".  she has an appt tomorrow with Dr. Para March at 09:30 a.m.  Reviewed Health History In EMR: Yes  Reviewed Medications In EMR: Yes  Reviewed Allergies In EMR: Yes  Reviewed Surgeries / Procedures: No  Date of Onset of Symptoms: 04/21/2012  Treatments Tried: Benadryl  Treatments Tried Worked: No  Guideline(s) Used:  Burns  Disposition Per Guideline:   Go to Office Now  Reason For Disposition Reached:   Chemical on skin that causes a blister  Advice Given:  Reassurance:  A minor thermal or chemical burn can be treated at home.  Here is some care advice that should help.  Intact (Closed) Blisters:  First 7 Days After a Burn: Leave intact blisters alone.  After 7 Days: You can gently remove the blisters. The easiest way to do this is gently wipe away the dead skin with some wet gauze or a wet washcloth.  Expected Course:  Burns usually hurt for 2-3 days.  First-Degree Burns: Usually peel  like a sunburn in about a week. The skin should look nearly normal after 2 weeks.  Second-Degree Burns: Blisters usually rupture within 7 days. Second-degree burns take 14-21 days to heal (longer than first-degree burns). Sometimes the skin looks a little darker or lighter than before after it has healed.  Pain Medicines:  Ibuprofen (e.g., Motrin, Advil):  Another choice is to take 600 mg (three 200 mg pills) by mouth every 8 hours.  Call Back If:  Severe pain lasts over 2 hours after taking pain medicine.  Burn starts to look infected (pus, red streaks, increased tenderness)  You become worse.  Patient Refused Recommendation:  Patient Will Make Own Appointment  Patient has appt tomorrow morning 09;30

## 2012-04-23 ENCOUNTER — Encounter: Payer: Self-pay | Admitting: Family Medicine

## 2012-04-23 ENCOUNTER — Ambulatory Visit (INDEPENDENT_AMBULATORY_CARE_PROVIDER_SITE_OTHER): Payer: BC Managed Care – PPO | Admitting: Family Medicine

## 2012-04-23 VITALS — BP 130/90 | HR 80 | Temp 98.0°F | Wt 182.0 lb

## 2012-04-23 DIAGNOSIS — L989 Disorder of the skin and subcutaneous tissue, unspecified: Secondary | ICD-10-CM

## 2012-04-23 DIAGNOSIS — R238 Other skin changes: Secondary | ICD-10-CM | POA: Insufficient documentation

## 2012-04-23 MED ORDER — FLUOCINONIDE 0.05 % EX SOLN: Freq: Two times a day (BID) | CUTANEOUS | Status: DC

## 2012-04-23 NOTE — Patient Instructions (Addendum)
Wash your hair today.  Use the lidex solution twice a day for itching.  This should gradually resolve.

## 2012-04-23 NOTE — Progress Notes (Signed)
Chemical treatment to hair 2 days ago.  Started having symptoms yesterday AM- red scalp.  Itching.  Skin is puffy inferior to scalp.  No other rash.  No fevers.  She had a URI prev but isn't SOB.  No inc in dysphagia- this is at baseline.    Taking benadryl and ibuprofen.  She put some silvadene on her forehead on the affected area.  Hasn't washed her hair yet.    Itching and puffiness bother the patient the most.   nad Red rash well demarcated across the forehead and and circumferentially around the scalp, blanches.  No ulceration.  No skin breakdown.  Skin appears diffusely irritated.  She appears to have dependent edema on the forehead and side of the face with a likely reactive LN of R side of neck posteriorly.  No stridor, OP wnl Neck supple, no anterior LA rrr ctab

## 2012-04-23 NOTE — Assessment & Plan Note (Signed)
Vs chemical burn.  Would wash hair today, gently, in the sink and avoid further skin contact.  Then can use topical lidex for itching.  Wash hands after application of lidex.  I would expect it to gradually improve.  Notify clinic if fever, purulent drainage.  She agrees. Avoid this hair treatment in the future. Caution for sun exposure.

## 2012-04-25 ENCOUNTER — Emergency Department (HOSPITAL_COMMUNITY)
Admission: EM | Admit: 2012-04-25 | Discharge: 2012-04-25 | Disposition: A | Discharge status: Home / Self Care | Payer: BC Managed Care – PPO | Source: Home / Self Care

## 2012-04-25 ENCOUNTER — Encounter (HOSPITAL_COMMUNITY): Payer: Self-pay

## 2012-04-25 DIAGNOSIS — L235 Allergic contact dermatitis due to other chemical products: Secondary | ICD-10-CM

## 2012-04-25 DIAGNOSIS — L253 Unspecified contact dermatitis due to other chemical products: Secondary | ICD-10-CM

## 2012-04-25 MED ORDER — PREDNISONE 20 MG PO TABS: ORAL_TABLET | ORAL | Status: DC

## 2012-04-25 NOTE — ED Provider Notes (Signed)
Medical screening examination/treatment/procedure(s) were performed by non-physician practitioner and as supervising physician I was immediately available for consultation/collaboration.  Leslee Home, M.D.   Reuben Likes, MD 04/25/12 2024

## 2012-04-25 NOTE — ED Notes (Signed)
Trip to beauty shop on Wednesday w new color treatment resulted in redness and facial swelling, seen 12-20 at her MD office

## 2012-04-25 NOTE — ED Provider Notes (Signed)
History     CSN: [id number]  Arrival date & time 04/25/12  1618   None     Chief Complaint  Patient presents with  . Chemical Exposure    (Consider location/radiation/quality/duration/timing/severity/associated sxs/prior treatment) HPI Comments: 66 year old presents with erythema and facial swelling since she had a new hair color applied 4 days ago. He started out with erythema along the hairline and then spread to the faces. She went to her doctor 2 days ago and was given Lidex cream and it is helped only modestly. She continues to have a deep redness along the hairline from a bitemporal and occiput. She has swelling of the face and perioral swelling. She st it stings and burns. She denies problem with airway, breathing, wheezing or other areas of swelling.   Past Medical History  Diagnosis Date  . Personal history of unspecified urinary disorder   . Unspecified essential hypertension   . Other and unspecified hyperlipidemia   . Diabetes mellitus without mention of complication   . Allergic rhinitis, cause unspecified   . Fibromyalgia   . Osteoarthritis   . GERD (gastroesophageal reflux disease)   . Pneumonia 1952  . Gout   . Normal cardiac stress test     2012    Past Surgical History  Procedure Date  . Stapidectomy 70-80    Left ear and right ear  . Knee arthroscopy 1979    Right  . Abdominal hysterectomy 1985  . Tubal ligation 1980's  . Breast surgery 1986    reduction( mammoplasty)  . Childbirth 1967    History reviewed. No pertinent family history.  History  Substance Use Topics  . Smoking status: Former Smoker    Quit date: 09/23/1988  . Smokeless tobacco: Never Used  . Alcohol Use: Yes     Comment: rarely    OB History    Grav Para Term Preterm Abortions TAB SAB Ect Mult Living                  Review of Systems  All other systems reviewed and are negative.    Allergies  Hydrochlorothiazide; Lisinopril; Lyrica; Omnipaque; Prozac; and  Topiramate  Home Medications   Current Outpatient Rx  Name  Route  Sig  Dispense  Refill  . AMOXICILLIN-POT CLAVULANATE 875-125 MG PO TABS   Oral   Take 1 tablet by mouth 2 (two) times daily.   20 tablet   0   . ASPIRIN 81 MG PO TABS   Oral   Take 81 mg by mouth daily.           Marland Kitchen BIOTIN 1000 MCG PO TABS      Take 1,000 mg three times per week.         . COLCHICINE 0.6 MG PO TABS   Oral   Take 1 tablet (0.6 mg total) by mouth daily. As needed for gout.   30 tablet   5   . DIPHENHYDRAMINE HCL 25 MG PO TABS   Oral   Take 25 mg by mouth every 6 (six) hours as needed.         . DULOXETINE HCL 30 MG PO CPEP   Oral   Take 1 capsule (30 mg total) by mouth daily. Take with 60mg  pill for total of 90mg          . FLUOCINONIDE 0.05 % EX SOLN   Topical   Apply topically 2 (two) times daily.   60 mL   1   .  HYDROCODONE-ACETAMINOPHEN 5-500 MG PO TABS   Oral   Take 1 tablet by mouth 2 (two) times daily as needed.   60 tablet   5   . LOSARTAN POTASSIUM 50 MG PO TABS   Oral   Take 1 tablet (50 mg total) by mouth daily.   90 tablet   3   . METFORMIN HCL 500 MG PO TABS   Oral   Take 1 tablet (500 mg total) by mouth daily.   90 tablet   3   . FISH OIL 1000 MG PO CPDR   Oral   Take 1 tablet by mouth at bedtime.           . OMEPRAZOLE 20 MG PO TBEC      Take one by mouth every morning.   90 tablet   3   . OXYBUTYNIN CHLORIDE 5 MG PO TABS   Oral   Take 5 mg by mouth 3 (three) times daily as needed.          Marland Kitchen PREDNISONE 20 MG PO TABS      Take 3 tabs po on first day, 2 tabs second day, 2 tabs third day, 1 tab fourth day, 1 tab 5th day. Take with food.   9 tablet   0   . TACROLIMUS 0.03 % EX OINT   Topical   Apply topically 2 (two) times daily as needed.         . TRIAMCINOLONE ACETONIDE 0.1 % EX CREA   Topical   Apply topically 3 (three) times daily.   30 g   1     There were no vitals taken for this visit.  Physical Exam  Nursing  note and vitals reviewed. Constitutional: She is oriented to person, place, and time. She appears well-developed and well-nourished. No distress.  HENT:  Mouth/Throat: Oropharynx is clear and moist.       Bilateral TMs are normal Oropharynx is clear and moist and mildly patent  Eyes: Conjunctivae normal and EOM are normal.  Neck: Normal range of motion. Neck supple.  Pulmonary/Chest: Effort normal and breath sounds normal. No respiratory distress. She has no wheezes. She has no rales.  Neurological: She is alert and oriented to person, place, and time. She exhibits normal muscle tone.  Skin: Skin is warm and dry. Rash noted.       See history of present illness. A band of erythema circumscribing the hairline on all sides.. Orbital swelling and swelling of the cheeks with erythema.  Psychiatric: She has a normal mood and affect.    ED Course  Procedures (including critical care time)  Labs Reviewed - No data to display No results found.   1. Chemical induced allergic contact dermatitis       MDM  Prednisone taper dose of 20 mg tablets 3 the first day to the next 2 days then one the next 2 days Checks her blood sugars often if they become elevated cardia Dr. he may want to had another tablet to help bring it down in the interim Use the Lidex cream 3 times a day Add Benadryl cream 2-3 times a day Had Zantac 75 mg twice a day Compresses to the rash and swelling areas several times during the day Followup with her doctor next week.        Hayden Rasmussen, NP 04/25/12 [phone]

## 2012-04-26 ENCOUNTER — Telehealth: Payer: Self-pay | Admitting: Family Medicine

## 2012-04-26 MED ORDER — DULOXETINE HCL 60 MG PO CPEP: 60.0000 mg | ORAL_CAPSULE | Freq: Every day | ORAL | Status: DC
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
## 2012-05-06 ENCOUNTER — Other Ambulatory Visit: Payer: Self-pay | Admitting: *Deleted

## 2012-05-06 MED ORDER — METFORMIN HCL 500 MG PO TABS: 500.0000 mg | ORAL_TABLET | Freq: Every day | ORAL | Status: DC

## 2012-05-06 MED ORDER — DULOXETINE HCL 60 MG PO CPEP: 60.0000 mg | ORAL_CAPSULE | Freq: Every day | ORAL | Status: DC

## 2012-05-06 MED ORDER — DULOXETINE HCL 30 MG PO CPEP: 30.0000 mg | ORAL_CAPSULE | Freq: Every day | ORAL | Status: DC

## 2012-05-06 MED ORDER — OMEPRAZOLE 20 MG PO TBEC: DELAYED_RELEASE_TABLET | ORAL | Status: DC

## 2012-05-06 MED ORDER — AMLODIPINE BESYLATE 10 MG PO TABS: ORAL_TABLET | ORAL | Status: DC

## 2012-05-06 NOTE — Telephone Encounter (Signed)
Received faxed refill request for a 90 day supply from mail order pharmacy

## 2012-05-07 ENCOUNTER — Other Ambulatory Visit: Payer: Self-pay | Admitting: *Deleted

## 2012-05-07 MED ORDER — OMEPRAZOLE 20 MG PO CPDR: 20.0000 mg | DELAYED_RELEASE_CAPSULE | Freq: Every day | ORAL | Status: DC

## 2012-05-07 MED ORDER — HYDROCODONE-ACETAMINOPHEN 5-500 MG PO TABS: 1.0000 | ORAL_TABLET | Freq: Two times a day (BID) | ORAL | Status: DC | PRN

## 2012-05-07 NOTE — Telephone Encounter (Signed)
Please fax in after I sign. Thanks.  

## 2012-05-07 NOTE — Telephone Encounter (Signed)
Faxed to pharmacy

## 2012-05-07 NOTE — Telephone Encounter (Signed)
Pt said insurance will not cover omeprazole 20 mg tab but will cover omeprazole 20 mg capsule (was on hx med list)the patient said [facility] will not fill rx so I do not need to cancel tablet rx. Pt notified omeprazole 20 mg cap sent to [facility].

## 2012-05-20 ENCOUNTER — Encounter: Payer: Self-pay | Admitting: Family Medicine

## 2012-06-09 ENCOUNTER — Other Ambulatory Visit (INDEPENDENT_AMBULATORY_CARE_PROVIDER_SITE_OTHER): Payer: BC Managed Care – PPO

## 2012-06-09 DIAGNOSIS — E119 Type 2 diabetes mellitus without complications: Secondary | ICD-10-CM

## 2012-06-09 LAB — LIPID PANEL
HDL: 47.2 mg/dL (ref >39.00)
Total CHOL/HDL Ratio: 5
Triglycerides: 198 mg/dL — ABNORMAL HIGH (ref 0.0–149.0)
VLDL: 39.6 mg/dL (ref 0.0–40.0)

## 2012-06-09 LAB — COMPREHENSIVE METABOLIC PANEL
AST: 77 U/L — ABNORMAL HIGH (ref 0–37)
Alkaline Phosphatase: 210 U/L — ABNORMAL HIGH (ref 39–117)
Glucose, Bld: 290 mg/dL — ABNORMAL HIGH (ref 70–99)
Sodium: 139 mEq/L (ref 135–145)
Total Bilirubin: 1.1 mg/dL (ref 0.3–1.2)
Total Protein: 8.3 g/dL (ref 6.0–8.3)

## 2012-06-09 LAB — MICROALBUMIN / CREATININE URINE RATIO
Creatinine,U: 101.5 mg/dL
Microalb, Ur: 19.6 mg/dL — ABNORMAL HIGH (ref 0.0–1.9)

## 2012-06-09 LAB — LDL CHOLESTEROL, DIRECT: Direct LDL: 140.9 mg/dL

## 2012-06-15 ENCOUNTER — Ambulatory Visit (INDEPENDENT_AMBULATORY_CARE_PROVIDER_SITE_OTHER)
Admission: RE | Admit: 2012-06-15 | Discharge: 2012-06-15 | Disposition: A | Discharge status: Home / Self Care | Payer: BC Managed Care – PPO | Source: Ambulatory Visit | Attending: Family Medicine | Admitting: Family Medicine

## 2012-06-15 ENCOUNTER — Ambulatory Visit (INDEPENDENT_AMBULATORY_CARE_PROVIDER_SITE_OTHER): Payer: BC Managed Care – PPO | Admitting: Family Medicine

## 2012-06-15 ENCOUNTER — Encounter: Payer: Self-pay | Admitting: Family Medicine

## 2012-06-15 VITALS — BP 138/84 | HR 88 | Temp 98.0°F | Wt 177.0 lb

## 2012-06-15 DIAGNOSIS — I10 Essential (primary) hypertension: Secondary | ICD-10-CM

## 2012-06-15 DIAGNOSIS — R4789 Other speech disturbances: Secondary | ICD-10-CM

## 2012-06-15 DIAGNOSIS — R05 Cough: Secondary | ICD-10-CM

## 2012-06-15 DIAGNOSIS — K219 Gastro-esophageal reflux disease without esophagitis: Secondary | ICD-10-CM

## 2012-06-15 DIAGNOSIS — R4781 Slurred speech: Secondary | ICD-10-CM

## 2012-06-15 DIAGNOSIS — IMO0001 Reserved for inherently not codable concepts without codable children: Secondary | ICD-10-CM

## 2012-06-15 DIAGNOSIS — Z136 Encounter for screening for cardiovascular disorders: Secondary | ICD-10-CM

## 2012-06-15 DIAGNOSIS — E119 Type 2 diabetes mellitus without complications: Secondary | ICD-10-CM

## 2012-06-15 MED ORDER — SITAGLIPTIN PHOSPHATE 100 MG PO TABS: 100.0000 mg | ORAL_TABLET | Freq: Every day | ORAL | Status: DC

## 2012-06-15 NOTE — Patient Instructions (Addendum)
Work on [employer], start the [place], and notify me about your sugar next week.  Go to the lab on the way out.  We'll contact you with your xray report.  See Shirlee Limerick about your referral before you leave today about the head CT.  We'll make other plans after I get the reports and I hear from you next week.
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
## 2012-06-16 ENCOUNTER — Encounter: Payer: Self-pay | Admitting: Family Medicine

## 2012-06-16 DIAGNOSIS — R4789 Other speech disturbances: Secondary | ICD-10-CM | POA: Insufficient documentation

## 2012-06-16 NOTE — Assessment & Plan Note (Signed)
Needs work with diet and weight.  She understands this.  Add on januvia, continue metformin- max tolerated 500mg  a day.  She may need insulin soon, depending on her sugars.  She'll notify me about her readings next week.  She agrees with plan.

## 2012-06-16 NOTE — Assessment & Plan Note (Signed)
She'll change to ARB when she runs out of amlodipine.  Controlled well enough for now. Needs work on diet. Discussed.

## 2012-06-16 NOTE — Assessment & Plan Note (Signed)
Normal exam, check outpatient CT head.  No focal deficits today.  No clear source for this on exam.  Would eval for CVA, though this is unlikely.  Unclear how much stress from her other conditions and her husband's condition plays a role.

## 2012-06-16 NOTE — Assessment & Plan Note (Signed)
Likely with fatty liver, needs weight loss and DM2 control.  D/w pt.

## 2012-06-16 NOTE — Assessment & Plan Note (Signed)
Continue cymbalta for now.  Her aches are improved.

## 2012-06-16 NOTE — Assessment & Plan Note (Signed)
I think this is causing the cough.  Continue PPI.  CXR unremarkable.  No sx of COPD and not on ACE.

## 2012-06-16 NOTE — Assessment & Plan Note (Signed)
With no exertional sx; her current sx could be related to GERD/anxiety due to illness and husband's condition.  Will follow.

## 2012-06-18 ENCOUNTER — Inpatient Hospital Stay: Admission: RE | Admit: 2012-06-18 | Payer: BC Managed Care – PPO | Source: Ambulatory Visit

## 2012-06-23 ENCOUNTER — Ambulatory Visit (INDEPENDENT_AMBULATORY_CARE_PROVIDER_SITE_OTHER)
Admission: RE | Admit: 2012-06-23 | Discharge: 2012-06-23 | Disposition: A | Discharge status: Home / Self Care | Payer: BC Managed Care – PPO | Source: Ambulatory Visit | Attending: Family Medicine | Admitting: Family Medicine

## 2012-06-23 DIAGNOSIS — R4789 Other speech disturbances: Secondary | ICD-10-CM

## 2012-06-24 ENCOUNTER — Encounter: Payer: Self-pay | Admitting: Family Medicine

## 2012-07-13 ENCOUNTER — Other Ambulatory Visit: Payer: Self-pay | Admitting: Family Medicine

## 2012-07-13 MED ORDER — SITAGLIPTIN PHOSPHATE 100 MG PO TABS: 100.0000 mg | ORAL_TABLET | Freq: Every day | ORAL | Status: DC

## 2012-07-27 ENCOUNTER — Other Ambulatory Visit: Payer: Self-pay | Admitting: Family Medicine

## 2012-07-27 NOTE — Telephone Encounter (Signed)
Electronic refill requests.  ? Rf since only 90 days given at last request.  Please advise.

## 2012-07-28 ENCOUNTER — Telehealth: Payer: Self-pay | Admitting: *Deleted

## 2012-07-28 MED ORDER — HYDROCODONE-ACETAMINOPHEN 5-300 MG PO TABS: 1.0000 | ORAL_TABLET | Freq: Two times a day (BID) | ORAL | Status: DC | PRN

## 2012-07-28 NOTE — Telephone Encounter (Signed)
Vicodin 5-500 is no longer available. Pharmacy requests to change to 5-300 1 PO BID. Ok to change. Please call in to [facility]

## 2012-07-28 NOTE — Telephone Encounter (Signed)
Please call in

## 2012-07-28 NOTE — Telephone Encounter (Signed)
Both sent.  Thanks.  

## 2012-07-29 ENCOUNTER — Other Ambulatory Visit: Payer: Self-pay | Admitting: *Deleted

## 2012-07-29 MED ORDER — HYDROCODONE-ACETAMINOPHEN 5-300 MG PO TABS: 1.0000 | ORAL_TABLET | Freq: Two times a day (BID) | ORAL | Status: DC | PRN

## 2012-07-29 NOTE — Telephone Encounter (Signed)
[facility] printed [facility]. Signed by GSD and faxed.

## 2012-08-06 ENCOUNTER — Other Ambulatory Visit: Payer: Self-pay | Admitting: Family Medicine

## 2012-08-06 MED ORDER — SITAGLIPTIN PHOSPHATE 100 MG PO TABS: 50.0000 mg | ORAL_TABLET | Freq: Every day | ORAL | Status: DC

## 2012-08-16 ENCOUNTER — Other Ambulatory Visit: Payer: Self-pay | Admitting: Family Medicine

## 2012-08-16 ENCOUNTER — Encounter: Payer: Self-pay | Admitting: Family Medicine

## 2012-09-17 ENCOUNTER — Encounter: Payer: Self-pay | Admitting: *Deleted

## 2012-09-17 ENCOUNTER — Other Ambulatory Visit (INDEPENDENT_AMBULATORY_CARE_PROVIDER_SITE_OTHER): Payer: BC Managed Care – PPO

## 2012-09-17 DIAGNOSIS — E119 Type 2 diabetes mellitus without complications: Secondary | ICD-10-CM

## 2012-09-17 LAB — HEMOGLOBIN A1C: Hgb A1c MFr Bld: 9.1 % — ABNORMAL HIGH (ref 4.6–6.5)

## 2012-09-23 ENCOUNTER — Encounter: Payer: Self-pay | Admitting: Family Medicine

## 2012-09-23 ENCOUNTER — Ambulatory Visit (INDEPENDENT_AMBULATORY_CARE_PROVIDER_SITE_OTHER): Payer: BC Managed Care – PPO | Admitting: Family Medicine

## 2012-09-23 VITALS — BP 116/84 | HR 80 | Temp 97.7°F | Wt 178.2 lb

## 2012-09-23 DIAGNOSIS — E119 Type 2 diabetes mellitus without complications: Secondary | ICD-10-CM

## 2012-09-23 NOTE — Patient Instructions (Addendum)
Recheck A1c in 3 months and we'll go from there.  Get a lab visit and let me see the labs first.

## 2012-09-23 NOTE — Progress Notes (Signed)
Diabetes:  Using medications without difficulties:yes Hypoglycemic episodes:no Hyperglycemic episodes:as below Feet problems: no Blood Sugars averaging: 175-250, usually 75-200 We talked about her med changes and intolerances.   A1c improved but not at goal.   Her mood is still low, but similar to prev.  This isn't an acute change.  She is still in counseling.   She was treated for ST/bronchitis with zmax in [place] with improvement.    Meds, vitals, and allergies reviewed.   ROS: See HPI.  Otherwise negative.    GEN: nad, alert and oriented, hard of hearing at baseline HEENT: mucous membranes moist NECK: supple w/o LA CV: rrr. PULM: ctab, no inc wob ABD: soft, +bs EXT: no edema SKIN: no acute rash  Diabetic foot exam: Normal inspection No skin breakdown No calluses  Normal DP pulses Normal sensation to light touch and monofilament Nails normal

## 2012-09-23 NOTE — Assessment & Plan Note (Signed)
She'll likely need insulin.  She was intolerant of [place].  A1c is much improved, discussed.  We talked about now vs later with insulin.  Would be okay to try another 3 months and then recheck A1c.  We'll go from there.  lantus would be a good option if A1c not much lower.  She agrees.

## 2012-09-30 ENCOUNTER — Encounter: Payer: Self-pay | Admitting: Family Medicine

## 2012-10-13 ENCOUNTER — Encounter: Payer: Self-pay | Admitting: Family Medicine

## 2012-10-15 ENCOUNTER — Other Ambulatory Visit: Payer: Self-pay | Admitting: Family Medicine

## 2012-10-15 MED ORDER — INSULIN PEN NEEDLE 31G X 6 MM MISC: Status: DC

## 2012-10-15 MED ORDER — INSULIN GLARGINE 100 UNIT/ML SOLOSTAR PEN: PEN_INJECTOR | SUBCUTANEOUS | Status: DC

## 2012-10-19 ENCOUNTER — Other Ambulatory Visit: Payer: Self-pay

## 2012-10-19 MED ORDER — INSULIN PEN NEEDLE 31G X 6 MM MISC: Status: DC

## 2012-10-19 MED ORDER — INSULIN GLARGINE 100 UNIT/ML SOLOSTAR PEN: PEN_INJECTOR | SUBCUTANEOUS | Status: DC

## 2012-10-19 NOTE — Telephone Encounter (Signed)
Pt is going out of town end of week and pt was told by [facility] not sure what Dr Para March is ordering; advised pt do not see any correspondence in pts chart from [facility] with questions about medication. Pt wants med and pen needles sent [facility] [address]. Pt will call [facility] and cancel rx for Lantus and pen needles. Advised pt done.

## 2012-10-22 ENCOUNTER — Other Ambulatory Visit: Payer: Self-pay | Admitting: Family Medicine

## 2012-10-25 ENCOUNTER — Telehealth: Payer: Self-pay

## 2012-10-25 NOTE — Telephone Encounter (Signed)
Pt FBS is ranging between 238-250; no real change in BS since on insulin. Pt presently taking Lantus 6 units if BS > 130. Pt flying to [place] on 10/26/12 for one month and wanted to know if needs to increase lantus dosage; pt also taking Metformin 500 mg once daily. Pt said she is feeling OK, stays exhausted and thirsty.Please advise.

## 2012-10-25 NOTE — Telephone Encounter (Signed)
I called her and clarified.  She is to add unit a day if >130 in AM, dec 1 unit if <90, no change if 90-130.  Her dose should gradually increase over the next few weeks.  She understood.  Call back as needed.

## 2012-10-29 ENCOUNTER — Telehealth: Payer: Self-pay | Admitting: *Deleted

## 2012-10-29 NOTE — Telephone Encounter (Signed)
As soon as I got off the phone with [employer] pt had left voicemail really upset because she received a EOB from her insurance and she said she owes them hundreds of dollars because of the urine drug screen and she wanted to know what to do.   I called pt back and left the phone # to call if you have any questions regarding the screening  I called [employer] and left message for Darl Pikes that we don't handle the billing questions for the urine drug screen and gave [employer] the phone # they need to call

## 2012-10-29 NOTE — Telephone Encounter (Signed)
Darl Pikes with [employer] wants to speak with Dr. Lianne Bushy assistant regarding pt and labs, she request a call back at [phone], once you call you have to ask for Darl Pikes at ext [phone]

## 2012-11-11 ENCOUNTER — Other Ambulatory Visit: Payer: Self-pay

## 2012-11-30 ENCOUNTER — Ambulatory Visit (INDEPENDENT_AMBULATORY_CARE_PROVIDER_SITE_OTHER): Payer: BC Managed Care – PPO | Admitting: Family Medicine

## 2012-11-30 ENCOUNTER — Other Ambulatory Visit (INDEPENDENT_AMBULATORY_CARE_PROVIDER_SITE_OTHER): Payer: BC Managed Care – PPO

## 2012-11-30 ENCOUNTER — Encounter: Payer: Self-pay | Admitting: Family Medicine

## 2012-11-30 VITALS — BP 132/86 | HR 88 | Temp 97.6°F | Wt 177.2 lb

## 2012-11-30 DIAGNOSIS — E119 Type 2 diabetes mellitus without complications: Secondary | ICD-10-CM

## 2012-11-30 DIAGNOSIS — M79604 Pain in right leg: Secondary | ICD-10-CM

## 2012-11-30 DIAGNOSIS — M79609 Pain in unspecified limb: Secondary | ICD-10-CM

## 2012-11-30 LAB — HEMOGLOBIN A1C: Hgb A1c MFr Bld: 10.1 % — ABNORMAL HIGH (ref 4.6–6.5)

## 2012-11-30 MED ORDER — INSULIN GLARGINE 100 UNIT/ML SOLOSTAR PEN: PEN_INJECTOR | SUBCUTANEOUS | Status: DC

## 2012-11-30 NOTE — Patient Instructions (Addendum)
Gently stretch your back and your calf and this should improve.  Take care.

## 2012-11-30 NOTE — Progress Notes (Signed)
Sugar 187 this AM, now up to 60 units a day.  See notes on labs re: A1c.   Sharp R episodic calf pain, very brief, painful. Can happen at rest or with exertion, it isn't clearly a/w either.  Started a few days ago.  Had been moving boxes in anticipation of move to [place].  Occ numbness concurrently in all 5 toes on the R foot.  Occ lower back soreness w/o radiation into the legs.  No weakness.   Meds, vitals, and allergies reviewed.   ROS: See HPI.  Otherwise, noncontributory.  nad ncat rrr ctab Back w/o midline pain and SLR neg w/o radicular sx.  Ext w/o edema No cords in the calves.  Legs and feet with normal inspection.  Normal pulses and sensation in the BLE

## 2012-12-01 DIAGNOSIS — M79604 Pain in right leg: Secondary | ICD-10-CM | POA: Insufficient documentation

## 2012-12-01 NOTE — Assessment & Plan Note (Signed)
Could be a cramp with compression of peripheral nerve vs radicular source.  Nontoxic.  Would stretch back and leg and this should improve, fu prn.

## 2012-12-06 ENCOUNTER — Encounter: Payer: Self-pay | Admitting: Radiology

## 2012-12-07 ENCOUNTER — Ambulatory Visit (INDEPENDENT_AMBULATORY_CARE_PROVIDER_SITE_OTHER): Payer: BC Managed Care – PPO | Admitting: Family Medicine

## 2012-12-07 ENCOUNTER — Encounter: Payer: Self-pay | Admitting: Family Medicine

## 2012-12-07 VITALS — BP 142/80 | HR 76 | Temp 97.6°F | Wt 180.5 lb

## 2012-12-07 DIAGNOSIS — F3289 Other specified depressive episodes: Secondary | ICD-10-CM

## 2012-12-07 DIAGNOSIS — E119 Type 2 diabetes mellitus without complications: Secondary | ICD-10-CM

## 2012-12-07 DIAGNOSIS — F329 Major depressive disorder, single episode, unspecified: Secondary | ICD-10-CM

## 2012-12-07 MED ORDER — HYDROXYZINE PAMOATE 25 MG PO CAPS: 25.0000 mg | ORAL_CAPSULE | Freq: Three times a day (TID) | ORAL | Status: DC | PRN

## 2012-12-07 MED ORDER — INSULIN GLARGINE 100 UNIT/ML SOLOSTAR PEN: PEN_INJECTOR | SUBCUTANEOUS | Status: DC

## 2012-12-07 MED ORDER — GLUCOSE BLOOD VI STRP: ORAL_STRIP | Status: DC

## 2012-12-07 NOTE — Progress Notes (Signed)
Diabetes:  Using medications without difficulties:yes Hypoglycemic episodes:no Hyperglycemic episodes:yes Feet problems:no Blood Sugars averaging: improved but still up, <200 now in AM She has gradually increased lantus to 65 units a day w/o hypoglycemia.  Diet discussed.    Anxiety.  Related to moving.  Sig upheaval moving across the country.  Still on cymbalta. Inc in fibromyalgia type aches with the inc in stress.   Can get PNA/Td/zosta at next CPE, ie on/after age 67.  The above concerns are/have been more pressing.    PMH and SH reviewed  Meds, vitals, and allergies reviewed.   ROS: See HPI.  Otherwise negative.    GEN: nad, alert and oriented HEENT: mucous membranes moist NECK: supple w/o LA CV: rrr. PULM: ctab, no inc wob ABD: soft, +bs EXT: no edema SKIN: no acute rash  Diabetic foot exam: Normal inspection No skin breakdown No calluses  Normal DP pulses Normal sensation to light touch and monofilament Nails normal

## 2012-12-07 NOTE — Patient Instructions (Addendum)
Gradually increase the insulin to get your AM sugar ~120-130.  At that point, fill out the grid with your daytime sugars.  We'll go from there.  Try the vistaril for anxiety in the meantime.  Take care.

## 2012-12-08 ENCOUNTER — Encounter: Payer: Self-pay | Admitting: Family Medicine

## 2012-12-08 NOTE — Assessment & Plan Note (Signed)
Would continue to inc lantus until AM sugars controlled.  She'll fill out pre/post grid at that point to see about need for mealtime insulin.  D/w pt.  She agrees. A1c likely lags her progress. Diet discussed.   We may or may not recheck A1c here given her pending move.  Can est with new MD in [place] in near future.  I have enjoyed seeing this pleasant patient over the years.

## 2012-12-08 NOTE — Assessment & Plan Note (Signed)
H/o, on cymbalta, with anxiety.  Would add on prn vistaril in meantime.  She is trying work through the move and the upheaval.  Okay for outpatient f/u.

## 2012-12-27 ENCOUNTER — Encounter: Payer: Self-pay | Admitting: Family Medicine

## 2012-12-29 ENCOUNTER — Other Ambulatory Visit: Payer: Self-pay | Admitting: Family Medicine

## 2012-12-29 ENCOUNTER — Other Ambulatory Visit: Payer: Self-pay | Admitting: *Deleted

## 2012-12-29 MED ORDER — AMLODIPINE BESYLATE 10 MG PO TABS: 10.0000 mg | ORAL_TABLET | Freq: Every day | ORAL | Status: DC

## 2012-12-29 MED ORDER — DULOXETINE HCL 60 MG PO CPEP: ORAL_CAPSULE | ORAL | Status: DC

## 2012-12-29 MED ORDER — HYDROCODONE-ACETAMINOPHEN 5-300 MG PO TABS: 1.0000 | ORAL_TABLET | Freq: Two times a day (BID) | ORAL | Status: DC | PRN

## 2012-12-29 MED ORDER — DULOXETINE HCL 30 MG PO CPEP: ORAL_CAPSULE | ORAL | Status: DC

## 2012-12-29 MED ORDER — COLCHICINE 0.6 MG PO TABS: 0.6000 mg | ORAL_TABLET | Freq: Every day | ORAL | Status: DC

## 2012-12-29 MED ORDER — OXYBUTYNIN CHLORIDE 5 MG PO TABS: 5.0000 mg | ORAL_TABLET | Freq: Three times a day (TID) | ORAL | Status: DC | PRN

## 2012-12-29 NOTE — Progress Notes (Signed)
Need qty and refills to call in.

## 2012-12-29 NOTE — Telephone Encounter (Signed)
rx called into pharmacy

## 2012-12-29 NOTE — Progress Notes (Signed)
Please call in the hydrocodone.   Thanks.

## 2012-12-29 NOTE — Progress Notes (Signed)
In med list- 180/1.

## 2013-01-28 ENCOUNTER — Other Ambulatory Visit: Payer: Self-pay

## 2013-01-28 DIAGNOSIS — Z1231 Encounter for screening mammogram for malignant neoplasm of breast: Secondary | ICD-10-CM

## 2013-01-30 ENCOUNTER — Telehealth: Payer: Self-pay | Admitting: Family Medicine

## 2013-01-30 NOTE — Telephone Encounter (Signed)
Please call pt about setting up a 30 min OV to discuss mood and her sugars- have her bring in the grid with her sugar readings.  Thanks.

## 2013-01-31 NOTE — Telephone Encounter (Signed)
Left message with husband.  Patient is in [place] visiting her children.  She has been keeping a grid and will call to schedule the appt as soon as she can.

## 2013-02-08 ENCOUNTER — Telehealth: Payer: Self-pay | Admitting: Family Medicine

## 2013-02-08 DIAGNOSIS — R5381 Other malaise: Secondary | ICD-10-CM

## 2013-02-08 DIAGNOSIS — E119 Type 2 diabetes mellitus without complications: Secondary | ICD-10-CM

## 2013-02-08 NOTE — Telephone Encounter (Signed)
Pt would like to schedule labs to check for anemia on 02/17/13 or 02/18/13.  Please call to schedule if orders are placed.  Best number for patient is [phone]

## 2013-02-09 NOTE — Telephone Encounter (Signed)
Orders are in.  I would get the A1c at the same time.  Thanks.

## 2013-02-10 NOTE — Telephone Encounter (Signed)
Left detailed message on voicemail.  

## 2013-02-17 ENCOUNTER — Other Ambulatory Visit (INDEPENDENT_AMBULATORY_CARE_PROVIDER_SITE_OTHER): Payer: Medicare Other

## 2013-02-17 DIAGNOSIS — E119 Type 2 diabetes mellitus without complications: Secondary | ICD-10-CM

## 2013-02-17 DIAGNOSIS — R5381 Other malaise: Secondary | ICD-10-CM

## 2013-02-17 LAB — CBC WITH DIFFERENTIAL/PLATELET
Basophils Absolute: 0 10*3/uL (ref 0.0–0.1)
Basophils Relative: 0.6 % (ref 0.0–3.0)
Eosinophils Absolute: 0.2 10*3/uL (ref 0.0–0.7)
Hemoglobin: 14.3 g/dL (ref 12.0–15.0)
Lymphs Abs: 2.4 10*3/uL (ref 0.7–4.0)
MCHC: 33.7 g/dL (ref 30.0–36.0)
MCV: 84.4 fl (ref 78.0–100.0)
Monocytes Absolute: 0.7 10*3/uL (ref 0.1–1.0)
Monocytes Relative: 8.3 % (ref 3.0–12.0)
Neutro Abs: 4.6 10*3/uL (ref 1.4–7.7)
RBC: 5.02 Mil/uL (ref 3.87–5.11)
RDW: 13.4 % (ref 11.5–14.6)
WBC: 7.9 10*3/uL (ref 4.5–10.5)

## 2013-02-18 ENCOUNTER — Encounter: Payer: Self-pay | Admitting: Radiology

## 2013-02-21 ENCOUNTER — Ambulatory Visit (INDEPENDENT_AMBULATORY_CARE_PROVIDER_SITE_OTHER): Payer: Medicare Other | Admitting: Family Medicine

## 2013-02-21 ENCOUNTER — Encounter: Payer: Self-pay | Admitting: Family Medicine

## 2013-02-21 VITALS — BP 124/80 | HR 75 | Temp 97.5°F | Wt 177.5 lb

## 2013-02-21 DIAGNOSIS — E119 Type 2 diabetes mellitus without complications: Secondary | ICD-10-CM

## 2013-02-21 DIAGNOSIS — Z1211 Encounter for screening for malignant neoplasm of colon: Secondary | ICD-10-CM | POA: Insufficient documentation

## 2013-02-21 DIAGNOSIS — F329 Major depressive disorder, single episode, unspecified: Secondary | ICD-10-CM

## 2013-02-21 DIAGNOSIS — Z23 Encounter for immunization: Secondary | ICD-10-CM

## 2013-02-21 NOTE — Assessment & Plan Note (Signed)
I had attributed her energy level to fibromyalgia, depression, stressful life situations.  I still think this is playing a role.  cymbalta maxed out, I would like to consider other options and then notify pt.  She agrees.  I doubt OSA is causing her sx, but we talked about this today.

## 2013-02-21 NOTE — Assessment & Plan Note (Signed)
IFOB today. This is reasonable.

## 2013-02-21 NOTE — Assessment & Plan Note (Signed)
Much improved, she has been working on diet.  Would continue as is, she can adjust the lantus up or down if needed.  She agrees.  She'll est with new MD in the near future after the move to [place].  I wish her only the best.  I've always been glad to see her.

## 2013-02-21 NOTE — Patient Instructions (Signed)
Go to the lab on the way out.  We'll contact you with your lab report. I would get the pneumonia shot (and redo your tetanus shot) after the move to [place].  Let me know when you need refills before the move.  Let me consider med options in the meantime.  Take care.  Keep working on your weight.  Always glad to see you.

## 2013-02-21 NOTE — Progress Notes (Signed)
DM2.  Taking 80 units a day of insulin.  Sugar has been ~100 recently.  A1c much improved.  No tingling in the feet, sensation is still good.  Working on losing weight, down a few lbs.  Will get f/u shot today.    Still exhausted.  CBC wnl.  "I'm tired all the time."  She can nap at any time.  She snores occasionally but her husband doesn't hear her stop breathing. Waking tired.  We had attributed some of this to her mood prev.  Her fatigue tends to be worse with high stress situations.  Move to [place] is pending, plan to leave in 3 to 4 weeks.  She doesn't think the cymbalta is making a huge difference now; it had helped some prev.    Discussed IFOB today for colon CA screening.  No FH colon cancer so IFOB is reasonable.    PMH and SH reviewed  ROS: See HPI, otherwise noncontributory.  Meds, vitals, and allergies reviewed.   nad ncat Mmm rrr ctab abd soft, not ttp Ext w/o edema.  Speech and affect wnl

## 2013-02-21 NOTE — Assessment & Plan Note (Signed)
Flu done today, she can reup Tetanus and get PNA in [place] now that she is 67.  She agrees.

## 2013-02-24 ENCOUNTER — Telehealth: Payer: Self-pay | Admitting: Family Medicine

## 2013-02-24 MED ORDER — BUPROPION HCL ER (SR) 100 MG PO TB12: 100.0000 mg | ORAL_TABLET | Freq: Every day | ORAL | Status: DC

## 2013-02-24 NOTE — Telephone Encounter (Signed)
Left detailed message on voicemail at home and alerted pt to the VM on her cell phone VM.

## 2013-02-24 NOTE — Telephone Encounter (Signed)
Please call pt.  I thought about her situation.  I would back the cymbalta down to 60mg  per day.  After 4 days, then would then add on wellbutrin 100mg  a day.  See if that helps.  That is likely the best option given her prev med trials/intolerances.  Let me know if that doesn't help- I would hope she would notice a change about 1 week after the med was added.  Thanks. rx sent.

## 2013-03-02 ENCOUNTER — Inpatient Hospital Stay: Admit: 2013-03-02 | Discharge: 2013-03-02 | Disposition: A | Discharge status: Home / Self Care | Payer: Self-pay

## 2013-03-02 ENCOUNTER — Ambulatory Visit
Admission: RE | Admit: 2013-03-02 | Discharge: 2013-03-02 | Disposition: A | Discharge status: Home / Self Care | Payer: Medicare Other | Source: Ambulatory Visit

## 2013-03-02 DIAGNOSIS — Z1231 Encounter for screening mammogram for malignant neoplasm of breast: Secondary | ICD-10-CM

## 2013-03-03 ENCOUNTER — Encounter: Payer: Self-pay | Admitting: *Deleted

## 2013-03-06 ENCOUNTER — Encounter: Payer: Self-pay | Admitting: Family Medicine

## 2013-03-07 ENCOUNTER — Encounter: Payer: Self-pay | Admitting: Family Medicine

## 2013-03-08 ENCOUNTER — Other Ambulatory Visit: Payer: Self-pay | Admitting: Family Medicine

## 2013-03-08 MED ORDER — METFORMIN HCL 500 MG PO TABS: 500.0000 mg | ORAL_TABLET | Freq: Every day | ORAL | Status: AC

## 2013-03-08 MED ORDER — HYDROCODONE-ACETAMINOPHEN 5-300 MG PO TABS: 1.0000 | ORAL_TABLET | Freq: Two times a day (BID) | ORAL | Status: AC | PRN

## 2013-03-08 MED ORDER — COLCHICINE 0.6 MG PO TABS: 0.6000 mg | ORAL_TABLET | Freq: Every day | ORAL | Status: AC

## 2013-03-08 MED ORDER — AMLODIPINE BESYLATE 10 MG PO TABS: 10.0000 mg | ORAL_TABLET | Freq: Every day | ORAL | Status: AC

## 2013-03-08 MED ORDER — BUPROPION HCL ER (SR) 100 MG PO TB12: 100.0000 mg | ORAL_TABLET | Freq: Every day | ORAL | Status: AC

## 2013-03-08 MED ORDER — TACROLIMUS 0.03 % EX OINT: TOPICAL_OINTMENT | Freq: Two times a day (BID) | CUTANEOUS | Status: AC | PRN

## 2013-03-08 MED ORDER — GLUCOSE BLOOD VI STRP: ORAL_STRIP | Status: AC

## 2013-03-08 MED ORDER — DULOXETINE HCL 60 MG PO CPEP: ORAL_CAPSULE | ORAL | Status: AC

## 2013-03-08 MED ORDER — INSULIN PEN NEEDLE 31G X 6 MM MISC: Status: AC

## 2013-03-08 MED ORDER — OXYBUTYNIN CHLORIDE 5 MG PO TABS: 5.0000 mg | ORAL_TABLET | Freq: Three times a day (TID) | ORAL | Status: AC | PRN

## 2013-03-08 MED ORDER — INSULIN GLARGINE 100 UNIT/ML SOLOSTAR PEN: PEN_INJECTOR | SUBCUTANEOUS | Status: AC

## 2013-03-08 MED ORDER — OMEPRAZOLE 20 MG PO CPDR: DELAYED_RELEASE_CAPSULE | ORAL | Status: AC

## 2013-03-10 ENCOUNTER — Encounter: Payer: Self-pay | Admitting: Family Medicine

## 2013-03-23 ENCOUNTER — Other Ambulatory Visit (INDEPENDENT_AMBULATORY_CARE_PROVIDER_SITE_OTHER): Payer: Medicare Other

## 2013-03-23 DIAGNOSIS — Z1211 Encounter for screening for malignant neoplasm of colon: Secondary | ICD-10-CM

## 2013-03-23 LAB — FECAL OCCULT BLOOD, IMMUNOCHEMICAL: Fecal Occult Bld: NEGATIVE

## 2013-03-24 ENCOUNTER — Encounter: Payer: Self-pay | Admitting: *Deleted

## 2013-05-05 DIAGNOSIS — N289 Disorder of kidney and ureter, unspecified: Secondary | ICD-10-CM

## 2013-05-05 HISTORY — DX: Disorder of kidney and ureter, unspecified: N28.9

## 2013-07-11 ENCOUNTER — Ambulatory Visit (INDEPENDENT_AMBULATORY_CARE_PROVIDER_SITE_OTHER): Payer: Medicare Other

## 2013-07-11 ENCOUNTER — Encounter: Payer: Self-pay | Admitting: Family Medicine

## 2013-07-11 ENCOUNTER — Ambulatory Visit: Payer: Medicare Other | Attending: Family Medicine | Admitting: Family Medicine

## 2013-07-11 VITALS — BP 139/81 | HR 75 | Temp 96.3°F | Resp 14 | Ht 62.5 in | Wt 178.0 lb

## 2013-07-11 DIAGNOSIS — M797 Fibromyalgia: Secondary | ICD-10-CM | POA: Insufficient documentation

## 2013-07-11 DIAGNOSIS — K219 Gastro-esophageal reflux disease without esophagitis: Secondary | ICD-10-CM | POA: Insufficient documentation

## 2013-07-11 DIAGNOSIS — E119 Type 2 diabetes mellitus without complications: Secondary | ICD-10-CM | POA: Insufficient documentation

## 2013-07-11 DIAGNOSIS — I10 Essential (primary) hypertension: Secondary | ICD-10-CM | POA: Insufficient documentation

## 2013-07-11 DIAGNOSIS — IMO0001 Reserved for inherently not codable concepts without codable children: Secondary | ICD-10-CM

## 2013-07-11 DIAGNOSIS — R252 Cramp and spasm: Secondary | ICD-10-CM | POA: Insufficient documentation

## 2013-07-11 DIAGNOSIS — F439 Reaction to severe stress, unspecified: Secondary | ICD-10-CM | POA: Insufficient documentation

## 2013-07-11 DIAGNOSIS — H9209 Otalgia, unspecified ear: Secondary | ICD-10-CM

## 2013-07-11 DIAGNOSIS — F418 Other specified anxiety disorders: Secondary | ICD-10-CM | POA: Insufficient documentation

## 2013-07-11 DIAGNOSIS — H9201 Otalgia, right ear: Secondary | ICD-10-CM | POA: Insufficient documentation

## 2013-07-11 DIAGNOSIS — F341 Dysthymic disorder: Secondary | ICD-10-CM

## 2013-07-11 LAB — CBC WITH DIFFERENTIAL
Basophils % Auto: 0.5 %
Basophils Abs Auto: 0.1 10*3/uL (ref 0–0.2)
Eosinophils % Auto: 2.1 %
Eosinophils Abs Auto: 0.2 10*3/uL (ref 0–0.5)
Hematocrit: 43 % (ref 36–46)
Hemoglobin: 14.2 g/dL (ref 12.0–16.0)
Lymphocytes % Auto: 26.2 %
Lymphocytes Abs Auto: 3 10*3/uL (ref 1.0–4.8)
MCH: 27.9 pg (ref 27–33)
MCHC: 33.1 % (ref 32–36)
MCV: 84.4 UM3 (ref 80–100)
MPV: 8.3 UM3 (ref 6.8–10.0)
Monocytes % Auto: 6.3 %
Monocytes Abs Auto: 0.7 10*3/uL (ref 0.1–0.8)
Neutrophils % Auto: 64.9 %
Neutrophils Abs Auto: 7.4 10*3/uL (ref 1.80–7.70)
Platelet Count: 290 10*3/uL (ref 130–400)
RDW: 13.9 UNITS (ref 0–14.7)
Red Blood Cell Count: 5.09 10*6/uL (ref 4.0–5.2)
White Blood Cell Count: 11.4 10*3/uL — ABNORMAL HIGH (ref 4.5–11.0)

## 2013-07-11 LAB — COMPREHENSIVE METABOLIC PANEL
Alanine Transferase (ALT): 64 U/L — ABNORMAL HIGH (ref 5–54)
Albumin: 4.1 g/dL (ref 3.2–4.6)
Alkaline Phosphatase (ALP): 128 U/L — ABNORMAL HIGH (ref 35–115)
Aspartate Transaminase (AST): 56 U/L — ABNORMAL HIGH (ref 15–43)
Bilirubin Total: 0.9 mg/dL (ref 0.3–1.3)
Calcium: 9.7 mg/dL (ref 8.6–10.5)
Carbon Dioxide Total: 28 mEq/L (ref 24–32)
Chloride: 99 mEq/L (ref 95–110)
Creatinine Blood: 0.88 mg/dL (ref 0.44–1.27)
E-GFR, African American: >60 SEE NOTE (ref >60)
E-GFR, Non-African American: >60 SEE NOTE (ref >60)
Glucose: 144 mg/dL — ABNORMAL HIGH (ref 70–99)
Potassium: 4 mEq/L (ref 3.3–5.0)
Protein: 8.4 g/dL — ABNORMAL HIGH (ref 6.3–8.3)
Sodium: 139 mEq/L (ref 135–145)
Urea Nitrogen, Blood (BUN): 16 mg/dL (ref 8–22)

## 2013-07-11 LAB — LIPID PANEL WITH DLDL REFLEX
Cholesterol: 262 mg/dL — ABNORMAL HIGH (ref 0–200)
HDL Cholesterol: 62 mg/dL (ref >=35)
LDL Cholesterol Calculation: 163 mg/dL — ABNORMAL HIGH (ref <130)
Non-HDL Cholesterol: 200 mg/dL — ABNORMAL HIGH (ref 0–150)
Total Cholesterol:HDL Ratio: 4.2 — ABNORMAL HIGH (ref <4.0)
Triglyceride: 183 mg/dL — ABNORMAL HIGH (ref 35–160)

## 2013-07-11 LAB — HEMOGLOBIN A1C
Hgb A1C,Glucose Est Avg: 154 mg/dL
Hgb A1C: 7 % — ABNORMAL HIGH (ref 3.9–5.6)

## 2013-07-11 LAB — TSH WITH FREE T4 REFLEX: Thyroid Stimulating Hormone: 0.61 u[IU]/mL (ref 0.35–3.30)

## 2013-07-11 LAB — VITAMIN D, 25 HYDROXY: Vitamin D, 25 Hydroxy: 20 ng/mL — ABNORMAL LOW (ref 30.0–100.0)

## 2013-07-11 MED ORDER — DULOXETINE 30 MG CAPSULE,DELAYED RELEASE
90.0000 mg | DELAYED_RELEASE_CAPSULE | Freq: Every day | ORAL | Status: DC
Start: 2013-07-11 — End: 2014-03-01

## 2013-07-11 NOTE — Progress Notes (Signed)
CC- Establish Care        History of Present Illness  Paula Daugherty is a 68yrold Female, new patient, here to get established.  Patient recently moved back here from [place]  Patient has multilple medical conditions including dm2, hypertension, fibromyalgia, depression/anxiety, gerd, gout.  Today she has the following concerns:    Right leg cramp x 1 week  It has stopped this week  Bananas seem to help. Wants to check for K.    Right ear pain started few weeks ago.  History of stapedectomy and otosclerosis.  Wants to see ENT.      Depression/anxiety:    Patient states she takes cymbalta.  According to [place] previous [place] is supposed to take 955mper day.  Patient has only been taking 6075mer day.  She gets increased depressed symptoms with the move.  States it is costing her a fortune to move back here.  Her daughter has some health problem so they need to be back.  Also, they now have 2 grandchildren.      ROSNTI:[id number].  CV: negative.  Resp: negative.  GI: negative.  GU: negative.         Patient Active Problem List   Diagnosis    DM2 (diabetes mellitus, type 2)    Depression with anxiety    Stress at home    Leg cramps-right calf    Right ear pain    Fibromyalgia    HTN (hypertension)    GERD (gastroesophageal reflux disease)       History   Substance Use Topics    Smoking status: Former Smoker    Smokeless tobacco: Not on file    Alcohol Use: Not on file       Allergies Contrast dye; Hydrochlorothiazide; Januvia; Lyrica; Omnipaque; Prozac; Sulfa (sulfonamide antibiotics); and Topiramate    OBJECTIVE:     BP 139/81  Pulse 75  Temp(Src) 35.7 C (96.3 F) (Tympanic)  Resp 14  Ht 1.588 m (5' 2.5")  Wt 80.74 kg (178 lb)  BMI 32.02 kg/m2  LMP 05/06/1983  Physical Exam:General Appearance: healthy, alert, no distress, pleasant affect, cooperative.  Heart:  normal rate and regular rhythm, no murmurs, clicks, or gallops.  Lungs: clear to  auscultation.  Abdomen: BS normal.  Abdomen soft, non-tender.  No masses or organomegaly.  Extremities:  no cyanosis, clubbing, or edema.  Mental Status: Appearance/Cooperation: in no apparent distress  Attitude: pleasant   Behavior :normal  Eye Contact: normal  Attention Span: good  Speech: normal volume, rate, and pitch  Mood (pt's report) :Mood pt's report, depressed mood, anxiety  Affect: full and appropriate     Assessment/Plan:      ICD-9-CM    1. Depression with anxiety 300.4 Duloxetine (CYMBALTA) 30 mg Delayed Release Capsule   2. Stress at home V61.9    3. Leg cramps-right calf 729.82    4. Right ear pain 388.70 ENT CLINIC REFERRAL   5. Fibromyalgia 729.1    6. DM2 (diabetes mellitus, type 2) 250.00 CBC WITH DIFFERENTIAL     COMPREHENSIVE METABOLIC PANEL     HEMOGLOBIN A1C     LIPID PANEL WITH DLDL REFLEX     TSH WITH FREE T4 REFLEX     VITAMIN D, 25 HYDROXY     Metformin (GLUCOPHAGE) 500 mg Tablet   7. HTN (hypertension) 401.9 Amlodipine (NORVASC) 10 mg Tablet     Aspirin 81 mg DR Tablet EC Tablet  8. GERD (gastroesophageal reflux disease) 530.81 Omeprazole (PRILOSEC) 20 mg Delayed Release Capsule   will increase her cymbalta to 90 mg per day.  Follow up 2 wks   Refer patient to ENT.    The current medical regimen is effective;  continue present plan and medications.    See Orders. Discussed care and warning signs with patient and all questions and concerns were fully answered. Patient  will call or followup in the office if any problems arise.     I reviewed past medical, family/social and medication history during the visit.     This note was created using the support of Dragon [employer] 10.1. Please note any grammatical, sound-alike, or syntax errors as likely dictation errors.    Virgel Manifold, D.O.  Associate Physician.  [facility].  [facility].

## 2013-07-11 NOTE — Nursing Note (Signed)
>>   Paula Daugherty     Mon Jul 11, 2013  8:55 AM  Vital signs taken, allergies verified, screened for pain,   Pervis Hocking [place], MA

## 2013-07-20 ENCOUNTER — Encounter: Payer: Self-pay | Admitting: Family Medicine

## 2013-07-27 ENCOUNTER — Ambulatory Visit: Payer: Medicare Other | Admitting: Family Medicine

## 2013-07-28 ENCOUNTER — Ambulatory Visit: Payer: Medicare Other | Admitting: Family Medicine

## 2013-07-28 VITALS — BP 136/86 | HR 74 | Temp 97.2°F | Wt 180.4 lb

## 2013-07-28 DIAGNOSIS — F341 Dysthymic disorder: Secondary | ICD-10-CM

## 2013-07-28 DIAGNOSIS — F418 Other specified anxiety disorders: Secondary | ICD-10-CM

## 2013-07-28 DIAGNOSIS — R945 Abnormal results of liver function studies: Secondary | ICD-10-CM

## 2013-07-28 DIAGNOSIS — R7989 Other specified abnormal findings of blood chemistry: Secondary | ICD-10-CM

## 2013-07-28 DIAGNOSIS — E559 Vitamin D deficiency, unspecified: Secondary | ICD-10-CM | POA: Insufficient documentation

## 2013-07-28 DIAGNOSIS — I10 Essential (primary) hypertension: Secondary | ICD-10-CM

## 2013-07-28 DIAGNOSIS — E119 Type 2 diabetes mellitus without complications: Secondary | ICD-10-CM

## 2013-07-28 DIAGNOSIS — E785 Hyperlipidemia, unspecified: Secondary | ICD-10-CM

## 2013-07-28 MED ORDER — ATORVASTATIN 20 MG TABLET: 20.0000 mg | ORAL_TABLET | Freq: Every day | ORAL | Status: DC

## 2013-07-28 MED ORDER — METFORMIN 500 MG TABLET: 500.0000 mg | ORAL_TABLET | Freq: Two times a day (BID) | ORAL | Status: AC

## 2013-07-28 NOTE — Nursing Note (Signed)
>>   Wilson Singer, MA     Thu Jul 28, 2013 11:15 AM  Vital signs taken, pharmacy verified, screened for pain.  Wilson Singer, MA

## 2013-07-28 NOTE — Progress Notes (Signed)
CC- Depression and Hand Pain        History of Present Illness  Paula Daugherty is a 68yrold Female follow up for:    Depression: she is taking cymbalta 99m  Has not noticed a big improvement.  Still feels anxious.  Part of the reason is her house is still a mess with the move.  They will rent some storage place.  [place]     Hypertension:  Stable    Dm2:  Not at goal.  Will increase for metformin to twice a day.  Continue with Lantus.     Hyperlipidemia:  LDL not at goal.  Patient not on any statin.  Will start lipitor 2072maily.    Abnormal LFT:  Patient had this before.  No previous abd ultrasound done.  Drinks occasionally, denies excessive alcohol.       ROSFTD:DUKGURKYHCWCBJegative.  CV: negative.  Resp: negative.  GI: negative.  GU: negative.    Patient Active Problem List   Diagnosis    DM2 (diabetes mellitus, type 2)    Depression with anxiety    Stress at home    Leg cramps-right calf    Right ear pain    Fibromyalgia    HTN (hypertension)    GERD (gastroesophageal reflux disease)    Hyperlipidemia LDL goal < 70    Vitamin D insufficiency    Abnormal LFTs       No family history on file.    History   Substance Use Topics    Smoking status: Former Smoker    Smokeless tobacco: Not on file    Alcohol Use: Not on file       Current Outpatient Prescriptions on File Prior to Visit   Medication Sig Dispense Refill    Amlodipine (NORVASC) 10 mg Tablet Take 1 tablet by mouth every day.  30 tablet  11    Aspirin 81 mg DR Tablet EC Tablet Take 1 tablet by mouth every day.  30 tablet  5    Duloxetine (CYMBALTA) 30 mg Delayed Release Capsule Take 3 capsules by mouth every day.  270 capsule  3    Omeprazole (PRILOSEC) 20 mg Delayed Release Capsule Take 1 capsule by mouth every morning. (acid)  30 capsule  1     No current facility-administered medications on file prior to visit.       Allergies Contrast dye; Hydrochlorothiazide;  Januvia; Lyrica; Omnipaque; Prozac; Sulfa (sulfonamide antibiotics); and Topiramate    OBJECTIVE:     BP 136/86  Pulse 74  Temp(Src) 36.2 C (97.2 F) (Tympanic)  Wt 81.829 kg (180 lb 6.4 oz)  LMP 05/06/1983  Physical Exam:General Appearance: healthy, alert, no distress, pleasant affect, cooperative.  Neck:  Neck supple. No adenopathy, thyroid symmetric, normal size.  Heart:  normal rate and regular rhythm, no murmurs, clicks, or gallops.  Lungs: clear to auscultation.  Abdomen: BS normal.  Abdomen soft, non-tender.  No masses or organomegaly.  Extremities:  no cyanosis, clubbing, or edema.  Mental Status: Appearance/Cooperation: in no apparent distress  Attitude: pleasant   Behavior :normal  Eye Contact: normal  Attention Span: good  Speech: normal volume, rate, and pitch  Mood (pt's report) :Mood pt's report, anxious  Affect: full and appropriate       Assessment/Plan:    Patient Active Problem List     ICD-9-CM    1. Depression with anxiety 300.4  2. HTN (hypertension) 401.9    3. DM2 (diabetes mellitus, type 2) 250.00 Metformin (GLUCOPHAGE) 500 mg Tablet     Insulin Glargine (LANTUS SOLOSTAR) 100 unit/mL (3 mL) Pen   4. Vitamin D insufficiency 268.9    5. Hyperlipidemia LDL goal < 70 272.4 Atorvastatin (LIPITOR) 20 mg Tablet     LIPID PANEL WITH DLDL REFLEX     HEPATIC FUNCTION PANEL   6. Abnormal LFTs 790.6 US ABDOMEN, COMPLETE   increase metformin to twice a day.  Start lipitor.  Check liver enzymes in 1 month.  Patient has baseline elevated liver enzymes.    See Orders. Discussed care and warning signs with patient and all questions and concerns were fully answered. Patient  will call or followup in the office if any problems arise.   Total encounter time including history, physical examination, and coordination of care was approximately 45 minutes of which more than 50% was spent counseling regarding assessment and treatment plan. Barriers to Learning: None.  Patient verbalizes understanding of  teaching and instructions.    I reviewed past medical, family/social and medication history during the visit.     This note was created using the support of Dragon [employer] 10.1. Please note any grammatical, sound-alike, or syntax errors as likely dictation errors.    Virgel Manifold, D.O.  Associate Physician.  [facility].  [facility].

## 2013-08-10 ENCOUNTER — Encounter: Payer: Self-pay | Admitting: Family Medicine

## 2013-08-10 ENCOUNTER — Ambulatory Visit: Payer: Medicare Other

## 2013-08-10 ENCOUNTER — Other Ambulatory Visit: Payer: Self-pay | Admitting: Family Medicine

## 2013-08-10 ENCOUNTER — Telehealth: Payer: Self-pay | Admitting: Family Medicine

## 2013-08-10 DIAGNOSIS — E119 Type 2 diabetes mellitus without complications: Secondary | ICD-10-CM

## 2013-08-10 MED ORDER — GLIPIZIDE ER 5 MG TABLET, EXTENDED RELEASE 24 HR
1.0000 | EXTENDED_RELEASE_CAPSULE | Freq: Every day | ORAL | Status: DC
Start: 2013-08-10 — End: 2013-08-12

## 2013-08-10 NOTE — Telephone Encounter (Signed)
Left message please callback the clinic and ask to speak to [facility] or Shirlean Mylar, if you are unable to callback today, I will call in the am, as there are new medications I need to review.

## 2013-08-10 NOTE — Telephone Encounter (Signed)
I don't think it is lipitor, but patient can hold lipitor for now.  I will add glucotrol XL 68m 1 daily.  Continue with metformin [id number] twice a day and her Lantus at night.  Please follow up with me next week and bring her blood sugar log.

## 2013-08-10 NOTE — Telephone Encounter (Signed)
From: [name]  To: [place], [place]  Sent: 08/10/2013 12:32 PM PDT  Subject: Non-urgent Medical Advice Question    Since I sent the email this morning and after breakfast my BS is 335. Should I keep taking the lipotor?

## 2013-08-10 NOTE — Telephone Encounter (Signed)
See telephone encounter..needed triage.

## 2013-08-10 NOTE — Telephone Encounter (Signed)
From: [name]  To: [place], [place]  Sent: 08/10/2013 9:15 AM PDT  Subject: Non-urgent Medical Advice Question    I wanted you to know that since starting the   lipitor and increasing my metformin my blood sugar levels have continued to rise. 142, 197, and today 201. Nothing else has changed.

## 2013-08-10 NOTE — Telephone Encounter (Signed)
See telephone encounter.

## 2013-08-10 NOTE — Telephone Encounter (Signed)
Copied from [facility]: 08/10/13  I wanted you to know that since starting the lipitor and increasing my metformin my blood sugar levels have continued to rise. 142, 197, and today 201. Nothing else has changed.   Since I sent the email this morning and after breakfast my BS is 335. Should I keep taking the lipotor.    Call to patient:   Blood sugar levels for the last 3 days were 142, 197, and 201 and then today after lunch it was 335. She says they started to rise after starting the Lipitor about   2 weeks ago. Also her Metformin was doubled and she now takes it twice a day. She also has felt "ansy" on the Lipitor and a little dizzy off and on, and her blood sugar has increased despite the increase in Metformin. She wonders if she should continue taking the Lipitor?  CONSULT MD:

## 2013-08-10 NOTE — Telephone Encounter (Signed)
Spoke with patient she agrees with medication plan. She wishes the glucotrol sent in to [facility] (uploaded).   She has already has an appt  scheduled with you on the 22nd. Will keep that appt and bring her blood sugar log into that visit.   Advised since this is a week later if there are any changes in her blood sugar on concerns with her medications to call to   The clinic and ask for [facility] and I can get her in sooner if needed.   Patient verbalizes understanding and agrees to callback with any increase in symptoms or concern.   CONSULT MD: thank you..can close chart after medicine sent in, she had no more questions.

## 2013-08-12 MED ORDER — GLIPIZIDE ER 5 MG TABLET, EXTENDED RELEASE 24 HR
1.0000 | EXTENDED_RELEASE_CAPSULE | Freq: Every day | ORAL | Status: DC
Start: 2013-08-12 — End: 2013-09-14

## 2013-08-12 NOTE — Telephone Encounter (Signed)
Dr. Renaee Munda, did you order the Glucotrol?

## 2013-08-12 NOTE — Telephone Encounter (Signed)
Yes, I ordered it then, but will resend again.  Thanks.

## 2013-08-16 ENCOUNTER — Ambulatory Visit: Payer: Medicare Other

## 2013-08-19 ENCOUNTER — Ambulatory Visit: Payer: Medicare Other

## 2013-08-19 ENCOUNTER — Ambulatory Visit: Payer: Medicare Other | Admitting: Family Medicine

## 2013-08-19 ENCOUNTER — Ambulatory Visit: Payer: Medicare Other | Attending: Family Medicine

## 2013-08-19 ENCOUNTER — Ambulatory Visit
Admission: RE | Admit: 2013-08-19 | Discharge: 2013-08-19 | Disposition: A | Discharge status: Home / Self Care | Payer: Medicare Other | Source: Ambulatory Visit | Attending: Family Medicine | Admitting: Family Medicine

## 2013-08-19 DIAGNOSIS — N281 Cyst of kidney, acquired: Secondary | ICD-10-CM

## 2013-08-19 DIAGNOSIS — K769 Liver disease, unspecified: Secondary | ICD-10-CM

## 2013-08-19 DIAGNOSIS — E785 Hyperlipidemia, unspecified: Secondary | ICD-10-CM | POA: Insufficient documentation

## 2013-08-19 DIAGNOSIS — R7989 Other specified abnormal findings of blood chemistry: Secondary | ICD-10-CM

## 2013-08-19 LAB — LIPID PANEL WITH DLDL REFLEX
Cholesterol: 177 mg/dL (ref 0–200)
HDL Cholesterol: 60 mg/dL (ref >=35)
LDL Cholesterol Calculation: 93 mg/dL (ref <130)
Non-HDL Cholesterol: 117 mg/dL (ref 0–150)
Total Cholesterol:HDL Ratio: 3 (ref <4.0)
Triglyceride: 122 mg/dL (ref 35–160)

## 2013-08-19 LAB — HEPATIC FUNCTION PANEL
Alanine Transferase (ALT): 64 U/L — ABNORMAL HIGH (ref 5–54)
Albumin: 3.9 g/dL (ref 3.2–4.6)
Alkaline Phosphatase (ALP): 136 U/L — ABNORMAL HIGH (ref 35–115)
Aspartate Transaminase (AST): 68 U/L — ABNORMAL HIGH (ref 15–43)
Bilirubin Direct: 0.1 mg/dL (ref 0.0–0.2)
Bilirubin Total: 0.8 mg/dL (ref 0.3–1.3)
Protein: 7.6 g/dL (ref 6.3–8.3)

## 2013-08-24 ENCOUNTER — Encounter: Payer: Self-pay | Admitting: Family Medicine

## 2013-08-24 ENCOUNTER — Ambulatory Visit: Payer: Medicare Other | Admitting: Family Medicine

## 2013-08-24 VITALS — BP 119/74 | HR 72 | Temp 97.5°F | Resp 14 | Ht 62.75 in | Wt 180.0 lb

## 2013-08-24 DIAGNOSIS — Z1211 Encounter for screening for malignant neoplasm of colon: Secondary | ICD-10-CM

## 2013-08-24 DIAGNOSIS — E785 Hyperlipidemia, unspecified: Secondary | ICD-10-CM

## 2013-08-24 DIAGNOSIS — R160 Hepatomegaly, not elsewhere classified: Secondary | ICD-10-CM

## 2013-08-24 DIAGNOSIS — Z0000 Encounter for general adult medical examination without abnormal findings: Secondary | ICD-10-CM

## 2013-08-24 DIAGNOSIS — Z1239 Encounter for other screening for malignant neoplasm of breast: Secondary | ICD-10-CM

## 2013-08-24 DIAGNOSIS — K760 Fatty (change of) liver, not elsewhere classified: Secondary | ICD-10-CM

## 2013-08-24 DIAGNOSIS — N281 Cyst of kidney, acquired: Secondary | ICD-10-CM

## 2013-08-24 DIAGNOSIS — R945 Abnormal results of liver function studies: Secondary | ICD-10-CM

## 2013-08-24 DIAGNOSIS — E559 Vitamin D deficiency, unspecified: Secondary | ICD-10-CM

## 2013-08-24 DIAGNOSIS — R7989 Other specified abnormal findings of blood chemistry: Secondary | ICD-10-CM

## 2013-08-24 DIAGNOSIS — E669 Obesity, unspecified: Secondary | ICD-10-CM

## 2013-08-24 NOTE — Progress Notes (Signed)
CC- Physical        History of Present Illness  Paula Daugherty is a 68yrold Female follow up for:    Hyperlipidemia:  Patient recently started on Lipitor 20 mg one daily.  She states that the Lipitor is causing her blood sugar to elevate, so she stopped taking Lipitor.  Her repeat lipid profile looked good.  LFTs remain elevated and slightly increased.  Patient does have baseline elevated LFTs from fatty liver seen on recent ultrasound the abdomen.  Incidental finding of renal cyst on the left kidney 6.4 cm.  Patient is concerned that the size of cyst is too big and would like to see a urologist.     Depression and anxiety:  Patient has increased her Cymbalta to 60 mg one daily.  Noticed minimal improvement.  Offered for her to see psychiatrist, which patient wanted to defer at this time.    All systems reviewed and negative except for what stated in history of present illness     Patient Active Problem List   Diagnosis    DM2 (diabetes mellitus, type 2)    Depression with anxiety    Stress at home    Leg cramps-right calf    Right ear pain    Fibromyalgia    HTN (hypertension)    GERD (gastroesophageal reflux disease)    Hyperlipidemia LDL goal < 70    Vitamin D insufficiency    Abnormal LFTs    Renal cyst ( 6.4 cm cyst left kidney)       History   Substance Use Topics    Smoking status: Former Smoker    Smokeless tobacco: Not on file    Alcohol Use: Not on file       Current Outpatient Prescriptions on File Prior to Visit   Medication Sig Dispense Refill    Amlodipine (NORVASC) 10 mg Tablet Take 1 tablet by mouth every day.  30 tablet  11    Aspirin 81 mg DR Tablet EC Tablet Take 1 tablet by mouth every day.  30 tablet  5    Duloxetine (CYMBALTA) 30 mg Delayed Release Capsule Take 3 capsules by mouth every day.  270 capsule  3    GlipiZIDE (GLUCOTROL XL) 5 mg SR Tablet Take 1 tablet by mouth every morning with a meal. (diabetes)  30 tablet  5    Insulin Glargine (LANTUS SOLOSTAR) 100 unit/mL  (3 mL) Pen Inject 64 Units subcutaneously every day at bedtime. Inject subQ at bedtime. (diabetes)  5 syringe  5    Metformin (GLUCOPHAGE) 500 mg Tablet Take 1 tablet by mouth 2 times daily with meals.  60 tablet  11    Omeprazole (PRILOSEC) 20 mg Delayed Release Capsule Take 1 capsule by mouth every morning. (acid)  30 capsule  1     No current facility-administered medications on file prior to visit.       Allergies Contrast dye; Hydrochlorothiazide; Januvia; Lyrica; Omnipaque; Prozac; Sulfa (sulfonamide antibiotics); and Topiramate    OBJECTIVE:     BP 119/74  Pulse 72  Temp(Src) 36.4 C (97.5 F) (Tympanic)  Resp 14  Ht 1.594 m (5' 2.75")  Wt 81.647 kg (180 lb)  BMI 32.13 kg/m2  LMP 05/06/1983  Physical Exam:General Appearance: healthy, alert, no distress, pleasant affect, cooperative.  Eyes:  conjunctivae and corneas clear. PERRL, EOM's intact. sclerae normal.  Ears:  normal TMs and canal and external inspection of ears show no abnormality.  Nose:  normal.  Mouth:  normal.  Neck:  Neck supple. No adenopathy, thyroid symmetric, normal size.  Heart:  normal rate and regular rhythm, no murmurs, clicks, or gallops.  Lungs: clear to auscultation.  Breast:  negative, inspection negative. No nipple discharge, bleeding or masses.  History of breast reduction  Abdomen: BS normal.  Abdomen soft, non-tender.  No masses or organomegaly.  Extremities:  no cyanosis, clubbing, or edema.  Skin:  Skin color, texture, turgor normal. No rashes or lesions.  Neuro: Gait normal. Reflexes normal and symmetric. Sensation and strength grossly normal.  Mental Status: Appearance/Cooperation: in no apparent distress  Musculoskeletal: Normal     Labs reviewed with patient     Assessment/Plan:      ICD-9-CM    1. Fatty liver 571.8    2. Enlarged liver 789.1    3. Renal cyst ( 6.4 cm cyst left kidney) 753.10 UROLOGY CLINIC REFERRAL   4. Abnormal LFTs 790.6    5. Hyperlipidemia LDL goal < 70 272.4 Atorvastatin (LIPITOR) 20 mg Tablet    6. Vitamin D insufficiency 268.9    7. Breast cancer screening V76.10 MAMMOGRAPHY SCREENING   8. Colon cancer screening V76.51 GASTROENTEROLOGY REFERRAL   9. Obesity 278.00    Recommend patient to loose weight.  Restart Lipitor at the lower dose 10 mg one daily.  Continue to monitor LFTs.  Refer patient to urologist to followup on left renal cyst.  Continue with vitamin D 5000 international units once a day.  Followup 1 month for skin check.    See Orders. Discussed care and warning signs with patient and all questions and concerns were fully answered. Patient  will call or followup in the office if any problems arise.     I reviewed past medical, family/social and medication history during the visit.     This note was created using the support of Dragon Medical 10.1. Please note any grammatical, sound-alike, or syntax errors as likely dictation errors.    Virgel Manifold, D.O.  Associate Physician.  [place] [facility].  [facility].

## 2013-08-24 NOTE — Nursing Note (Signed)
>>   Paula Daugherty     Wed Aug 24, 2013  9:56 AM  Vital signs taken, allergies verified, screened for pain,   Pervis Hocking [place], MA

## 2013-09-08 ENCOUNTER — Other Ambulatory Visit: Payer: Self-pay | Admitting: Family Medicine

## 2013-09-08 MED ORDER — PEG3350 100 GRAM-SOD SULF 7.5 GRAM-NACL-KCL-ASCORBATE-C ORAL PWDR PACK
ORAL | Status: AC
Start: 2013-09-08 — End: 2013-09-09

## 2013-09-12 ENCOUNTER — Ambulatory Visit: Payer: Medicare Other | Admitting: Audiologist

## 2013-09-12 ENCOUNTER — Ambulatory Visit: Payer: Medicare Other | Attending: Otolaryngology | Admitting: Otolaryngology

## 2013-09-12 VITALS — Temp 97.5°F | Ht 62.0 in | Wt 182.8 lb

## 2013-09-12 DIAGNOSIS — H908 Mixed conductive and sensorineural hearing loss, unspecified: Secondary | ICD-10-CM

## 2013-09-12 DIAGNOSIS — H8090 Unspecified otosclerosis, unspecified ear: Secondary | ICD-10-CM | POA: Insufficient documentation

## 2013-09-12 DIAGNOSIS — H906 Mixed conductive and sensorineural hearing loss, bilateral: Secondary | ICD-10-CM | POA: Insufficient documentation

## 2013-09-12 DIAGNOSIS — K219 Gastro-esophageal reflux disease without esophagitis: Secondary | ICD-10-CM | POA: Insufficient documentation

## 2013-09-12 DIAGNOSIS — H9209 Otalgia, unspecified ear: Secondary | ICD-10-CM

## 2013-09-12 DIAGNOSIS — H905 Unspecified sensorineural hearing loss: Secondary | ICD-10-CM | POA: Insufficient documentation

## 2013-09-12 DIAGNOSIS — H6120 Impacted cerumen, unspecified ear: Secondary | ICD-10-CM | POA: Insufficient documentation

## 2013-09-12 DIAGNOSIS — H7290 Unspecified perforation of tympanic membrane, unspecified ear: Secondary | ICD-10-CM | POA: Insufficient documentation

## 2013-09-12 NOTE — Nursing Note (Signed)
>>   Memory [place], [place]     Mon Sep 12, 2013  5:30 PM  Pt. Roomed, name & DOB verified, Height, Weight, Temp checked.  Allergies, meds & pain tobacco history reviewed. Pt. Placed in Binocular scope Rm. set up and clean up of area done as needed.  AVS with all necessary instruction ready for Pt./ family member. With understanding noted. [id number]

## 2013-09-12 NOTE — Progress Notes (Addendum)
Patient: Paula Daugherty  Location: Otolaryngology Clinic   Medical Record Number: [id number] Sex: female Age: 31yr Date of Service: 09/12/2013 Date of Birth: 904/01/1946    OTOLARYNGOLOGY - HEAD AND NECK SURGERY  INITIAL CONSULTATION  NOTE    Dear Dr. PRenaee Munda    Thank you very much for referring your patient, Paula Daugherty to our clinic.     History of Present Illness:  NJoleena Weisenburgeris a 89yrld female presenting with a c/o R otalgia. Patient reports 2-3 week intermittent, fleeting, sharp shooting R-sided otalgia with pain sometimes radiating to jaw line. Has experienced same pain on L side x2-3 days. Denies otorrhea/fever/HA. Denies significant change in hearing since onset of pain. Denies pain with opening/closing jaw. Patient endorses h/o GERD.    H/o otosclerosis s/p b/l stapedectomies, Left [id number]'[id number] in 1980's.  Bilateral house wire prosthesis. [facility]  Paper patch placed in L TM 2009.    No d/d/i/v      Past medical history, past surgical history, drug allergies, medications, and pertinent social and family medical history were reviewed with the patient, and details of these were provided by the patient in the ENT Clinic new patient questionnaire. This was reviewed and verified with the patient today.     Review of Systems is performed, including general, cardiovascular, respiratory, gastrointestinal, genitourinary, musculoskeletal, endocrinologic, dermatologic, hematologic, immunologic, neurologic, and psychiatric. Details of these were provided by the patient in the ENT Clinic new patient questionnaire. Please refer to this questionnaire for full details. This was reviewed and verified with the patient today. The pertinent positives and negatives are listed in the history of present illness.       PHYSICAL EXAMINATION:  General: The patient appears well-developed, well-nourished, is in no distress, pleasant and cooperative. Please refer to the EMR for vital signs.   Psychiatric: Mood and  affect are stable. Oriented to person, place, time and condition.   Dermatologic: Skin warm and dry.  Head: Normocephalic, grossly normal appearance. No obvious external lesions.  Face: Facial nerve function intact and symmetric bilaterally.   Eyes: Vision grossly intact; extraocular movements intact and symmetric bilaterally, no conjunctival lesions. Gaze is conjugate.   Nose: No external deformities; midline septum, nasal cavities clear without lesions or obstruction bilaterally.  Oral cavity: Clear, normal lips, normal mucosa, no lesions.   Oropharynx: Clear, normal mucosa, no masses or lesions.   Neck: Supple, no masses, normal cervical musculoskeletal exam.  Thyroid: Normal, no masses, no thyromegaly.   Lymphatic: No cervical or periauricular lymphadenopathy bilaterally.  Neurologic: Gait and station normal. Moves all extremities equally. 5/5 strength in all extremities.   Respiratory: Breathing comfortably without overuse of accessory muscles of respiration. No shortness of breath or tachypnea. No audible wheezing or stridor.  Cranial nerves: Cranial nerves II through XII intact and symmetric bilaterally, except for VIII, please see clinical audiologic testing below.    Bilateral ear examination under binocular microscopy:  Right ear: Auricle normal. EAC mid canal cerumen impaction, cleared with curettes. Once clear, distal EAC clean. TM translucent, neutral. ME aerated. Wire loop visible under posterior/superior quadrant. Mobile on positive/negative pneumatic otoscopy.   Left ear: Auricle normal. EAC partial cerumen impaction cleared with curette. Significant crusting lining distal canal, elevated and removed with microalligators. Crusting elevated posteriorly revealing posterior marginal perf 5% clean edges, no obvious skin ingrowth. No obvious keratin within ME space. Wire loop prosthesis seen in posterior/superior quadrant.    Following debridement, hearing subjectively increased on Rt,  no change on Lt. No  degradation.     PROCEDURE NOTE:  In order to adequately visualize the ear structures, bilateral ear examination using the binocular microscope, please see dictated physical examination for details and results of otomicroscopy.     CT HEAD:  I personally reviewed this CT report from 06/2012 with the patient in clinic and find the following results:  Report essentially unremarkable    AUDIOGRAM:  I personally reviewed audiometry from 09/12/13 and find the following results:  Pure tone audiometry: R-mild to severe SNHL L-moderate to severe MHL with 30 dB ABG at 250 Hz only  Speech reception threshold: R30 L50  Speech discrimination: R92 L96  Tympanometry: R-Type A L-perf?  Acoustic Reflexes: DNT      IMPRESSION:  1. H/o otosclerosis s/p B/L stapedectomies with wire house prosthesis 30+ years ago with continued good closure of air bone gaps  2. 2-3 weeks intermittent, fleeting, sharp Rt otalgia not c/w TMJ and without evidence of ear disease  3. 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 Probable diagnoses discussed with the patient.   2. Possible treatment options discussed with the patient.   3. Discussed option of repairing L marginal TM perforation with Lt tympanoplasty. However, as patient has been stable with existing perforation for 30+ years without infection, hearing loss or sign of ingrowth cholesteatoma on today's exam, recommend continued monitoring with observation only. She indicates understanding and is in agreement with this suggestion. If she experiences drainage or other signs of infection, or if her hearing worsens markedly, she will contact me for evaluation and further discussion of options.   4. Unsure exact etiology of Rt otalgia, given nature of pain inconsistent with TMJ and ear disease. Dicussed possibility of pain being  secondary to patient's h/o GERD/LPR. Discussed precautions, including small frequent meals versus large infrequent meals. Patient is motivated to pursue this option.  5. Patient has prior Phonak hearing aids, but needs adjustment with new hearing thresholds versus new set of hearing aids. HAE request placed today.  6. Follow up with me PRN. I would be happy to see her at any time if any problems arise.       Dr. Renaee Munda, thank you again for your kind referral.  If you have any further questions, please do not hesitate to contact me at any time.    --------------------------------    DISCLAIMER:  This document serves as my personal record of my services as a trained medical scribe, created on 09/12/2013 for the Department of Otolaryngology- Head and Neck, in the presence of Meryle Ready, MD FACS Surgery.    09/12/2013  Electronically Signed by:  Roselyn Meier  Trained Medical Scribe  Department of Otolaryngology-Head and Neck Surgery  [facility]             This document serves as my personal record of services taken in my presence. It was created on 09/12/2013 on my behalf by Roselyn Meier, a trained medical scribe.               I have reviewed this document and agree that this note accurately reflects the history and exam findings, the patient care provided, and my medical decision making.    09/12/2013  Electronically Signed By:   Moses Manners.  Ferrel Logan, MD FACS  Associate Professor  Otology, Neurotology, and [facility] Surgery  Department of Otolaryngology - Head and Neck Surgery  [facility]

## 2013-09-13 NOTE — Procedures (Signed)
Paula Daugherty was seen today for an audiologic evaluation.  She is a 68 year-old woman who reports bilateral hearing loss.  She has had stapedectomy procedures in both ears at [facility].  The first ear was operated on in the mid 1970's and the left in the early 1980's.  She reports that her hearing has decreased since the procedure.  She also reports a possible t.m. perforation in the left ear.  She has a Phonak hearing aid obtained from [facility] for the right ear.  She doesn't use the aid consistently.    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  The loss was mostly sensorineural with mild air-bone gaps at 2 frequencies.  The left ear had moderate to severe mixed loss.     Speech:  Speech reception thresholds were 30 dB for the right ear and 50 dB for the left.  Word recognition scores were 92% for the right ear and 96% for the left.     Immittance:  Tympanograms were normal (type A) in the right ear and consistent with a t.m. perforation in the left.     Results were discussed. [name] will follow up today as scheduled in the ENT Clinic    Audiogram to be scanned into EMR    Report Electronically Signed By:    Name:   Mariana Arn, M.A.  Training Level:  Biomedical engineer  Department:  ENT  Phone #:  [phone]

## 2013-09-14 ENCOUNTER — Encounter: Payer: Self-pay | Admitting: Family Medicine

## 2013-09-14 DIAGNOSIS — E785 Hyperlipidemia, unspecified: Secondary | ICD-10-CM

## 2013-09-14 DIAGNOSIS — E119 Type 2 diabetes mellitus without complications: Secondary | ICD-10-CM

## 2013-09-14 MED ORDER — ATORVASTATIN 20 MG TABLET: 10.0000 mg | ORAL_TABLET | Freq: Every day | ORAL | Status: DC

## 2013-09-14 MED ORDER — INSULIN GLARGINE (U-100) 100 UNIT/ML (3 ML) SUBCUTANEOUS PEN: 64.0000 [IU] | PEN_INJECTOR | Freq: Every day | SUBCUTANEOUS | Status: DC

## 2013-09-14 MED ORDER — GLIPIZIDE ER 5 MG TABLET, EXTENDED RELEASE 24 HR
1.0000 | EXTENDED_RELEASE_CAPSULE | Freq: Every day | ORAL | Status: DC
Start: 2013-09-14 — End: 2013-12-13

## 2013-09-14 NOTE — Telephone Encounter (Signed)
From: [name]  To: [place], [place]  Sent: 09/13/2013 6:36 PM PDT  Subject: Non-urgent Medical Advice Question    I need an Rx for     Lipitor 10 MG qd x 90 days.   Glipizide XL 5 MG qd x 90 days    Plus I need my insulin and I know it can be written in such a way there is no copay. Can you do that? Also my insurance asks that long term drugs be written for 90 days.     Thanks [phone].

## 2013-10-06 ENCOUNTER — Ambulatory Visit: Payer: Medicare Other | Admitting: Family Medicine

## 2013-10-19 ENCOUNTER — Ambulatory Visit: Payer: Medicare Other | Attending: Urology | Admitting: Urology

## 2013-10-19 VITALS — BP 140/84 | HR 74 | Temp 98.6°F

## 2013-10-19 DIAGNOSIS — N281 Cyst of kidney, acquired: Secondary | ICD-10-CM | POA: Insufficient documentation

## 2013-10-19 DIAGNOSIS — Q619 Cystic kidney disease, unspecified: Secondary | ICD-10-CM | POA: Insufficient documentation

## 2013-10-19 NOTE — Progress Notes (Signed)
Re: Paula Daugherty, Paula Daugherty  MRN# [id number]  DOB: Oct 08, 1945  DOV: 10/19/2013    Dear No Pcp No Pcp, MD    I had the pleasure today of meeting your [name] at the [facility]rology faculty practice. As you recall, he is a 68yrold female referred for consultation regarding his new diagnosis of a left renal cyst.    The cyst was identified on ultrasound in April 2015 after she presented to her PCP with complaints of left flank pain, occasional bilateral lower back pain. No hematuria. No history of UTIs. No personal or family history of kidney stones.     No fevers, chills, nausea, vomiting.     07/2013: creatinine 0.9     08/2013 Renal [place]  1. LIVER ENLARGED, ECHOGENIC AND COARSE IN ECHO TEXTURE SUGGESTIVE OF  STEATOSIS OR PARENCHYMA CHANGES.  2. NO ABNORMALITIES OF GALLBLADDER AND BILIARY TREE.  3. LEFT KIDNEY WITH A 6.4 CM CYST WITH INTERNAL SEPTATION IN THE UPPER MID  POLE. SIX-MONTH FOLLOWUP FOR STABILITY RECOMMENDED, AS CLINICALLY  INDICATED.     Allergies:   Contrast Dye [Radiopaque Agent]    Hives  Hydrochlorothiazide    Other-Reaction in Comments    Comment:Patient reports  Januvia [Sitagliptin]    Other-Reaction in Comments    Comment:Patient reports  Lyrica [Pregabalin]    Other-Reaction in Comments    Comment:Patient reports  Omnipaque [Iohexol]    Other-Reaction in Comments    Comment:Patient reports  Prozac [Fluoxetine Hcl]    Other-Reaction in Comments    Comment:Patient reports  Sulfa (Sulfonamide Antibiotics)    Rash  Topiramate    Other-Reaction in Comments    Comment:Patient reports    Past Medical History:   Fibromyalgia, DM2, fatty liver disease, HTN, HLD    Past Surgical History:  Tubal ligation 80s  Hysterectomy 1985  Breast reduction 1986  Right knee surgery 1979    Family History: unknown, patient not close with family    Social History: Do you smoke? Quit 25 years ago  Do you drink? Occasional    Current Outpatient Prescriptions   Medication Sig Dispense Refill    Amlodipine (NORVASC) 10 mg  Tablet Take 1 tablet by mouth every day.  30 tablet  11    Aspirin 81 mg DR Tablet EC Tablet Take 1 tablet by mouth every day.  30 tablet  5    Atorvastatin (LIPITOR) 20 mg Tablet Take 0.5 tablets by mouth every day.  90 tablet  1    Cholecalciferol, Vitamin D3, (VITAMIN D-3) 2,000 unit Tablet Take 1 tablet by mouth every day.        DiphenhydrAMINE ([facility] ALLERGY) 25 mg Tablet Tablet Take 1-2 tablets by mouth every 4 to 6 hours if needed. Indications: Allergic Reactions        Duloxetine (CYMBALTA) 30 mg Delayed Release Capsule Take 3 capsules by mouth every day.  270 capsule  3    GlipiZIDE (GLUCOTROL XL) 5 mg SR Tablet Take 1 tablet by mouth every morning with a meal. (diabetes)  90 tablet  1    Hydrocodone 5 mg/Acetaminophen 500 mg (VICODIN) 5-500 mg Tablet Take 1 tablet by mouth two times Daugherty if needed for pain (severe).        Insulin Glargine (LANTUS SOLOSTAR) 100 unit/mL (3 mL) Pen Inject 64 Units subcutaneously every day at bedtime. Inject subQ at bedtime. (diabetes)  15 syringe  1    Metformin (GLUCOPHAGE) 500 mg Tablet Take 500 mg by mouth 2  times Daugherty with meals.        Omeprazole (PRILOSEC) 20 mg Delayed Release Capsule Take 1 capsule by mouth every morning. (acid)  30 capsule  1    Oxybutynin (DITROPAN) 5 mg Tablet Take 5 mg by mouth 3 times Daugherty.         No current facility-administered medications for this visit.       Review of systems: Constitutional: negative.  Cardiovascular: negative.  Respiratory: negative.  Gastrointestinal: negative.  Musculoskeletal: negative.  Skin: negative.  Neurologic: negative.  Psych: Mood pt's report, euthymic.  Endocrine: negative.  Genital/Uro: negative.  Heme/Lymph: negative.  Allergy/Immun: negative.  All other systems negative.    Physical Exam:  Vitals are stable. General Appearance: in no apparent distress Heart/Chest: S1, S2 normal, no murmur, click, rub or gallop, regular rate and rhythm, regular rate and rhythm, chest is clear without rales or  wheezing, no pedal edema, no JVD, no hepatosplenomegaly. HEENT: Head; normocephalic, atraumatic, Abdomen: without rebound, guarding, mass or organomegaly. Abdomen is soft and bowel sounds are normal. No CVAT. Neuro: normal without focal findings, mental status, speech normal, alert and oriented x iii, PERLA, reflexes normal and symmetric. Extremities: negative    I personally reviewed her renal US.      In review this a 62yrfemale with a new diagnosis of a left renal cyst. We reviewed the ultrasound together. We discussed that renal cysts are common and that the vast majority are benign. It is not likely to be the source of her left flank or back pain. On ultrasound the cyst does not appear to be very complex, it is round with a clearly demarcated wall and minimal internal septation. It is most likely benign. However, we discussed that rarely cysts can harbor a cystic type of renal cell carcinoma. We discussed that an MRI may be helpful in the complete evaluation of the cyst.     - We have recommended obtaining a contrast enhanced MRI to further evaluate this cyst.   - will base follow up on the MRI results    Once again, thank you for requesting this consultation. Please do not hesitate to contact me with any questions or concerns.      UGrubbsInterpreter used: no    I spent 20 minutes with the patient, 15 minutes of which were spent counseling the patient on management of renal cysts      The patient indicates understanding of these issues and agrees with the plan.    PChristianne Dolin MD  PGY-3 Urology  PI: [phone]  Urology Pager: [phone]   This patient was seen, evaluated, and care plan was developed with the resident.  I agree with the assessment and plan as outlined in the resident's note.  Report electronically signed by MMarius Ditch MD. Attending

## 2013-10-19 NOTE — Nursing Note (Signed)
>>   [facility]     Wed Oct 19, 2013  8:54 AM  Vital signs taken, allergies verified, screened for pain, med hx taken. No acute distress noted at this time.  Whitney Muse, LVN

## 2013-10-20 ENCOUNTER — Other Ambulatory Visit: Payer: Self-pay | Admitting: Family Medicine

## 2013-10-20 ENCOUNTER — Telehealth: Payer: Self-pay | Admitting: Family Medicine

## 2013-10-20 DIAGNOSIS — E119 Type 2 diabetes mellitus without complications: Secondary | ICD-10-CM

## 2013-10-20 NOTE — Telephone Encounter (Signed)
Paula Daugherty is a 68yrold female  3 patient identifiers used    Patient states she is using 64 units of Lantus at bedtime.  Was given one box which holds 5 pens.     CONSULT MD:  I called the pharmacy and there was a mistake.  Someone misread the 15 ml on the box and just gave her one box.  The rest of her prescription will be ready after 1500 today.  Pharmacy states this will use up her refill.    Patient notified.

## 2013-10-20 NOTE — Telephone Encounter (Signed)
Patient is scheduled to see Dr. [name] 11/29/13.

## 2013-10-20 NOTE — Telephone Encounter (Signed)
Future appt. With pcp

## 2013-10-20 NOTE — Telephone Encounter (Signed)
Please, see how many units patient takes of lantus at night; prescription says she is taking 64 units and was given 15 pens, which should last more than 1 month.   Let me know. Betsey Amen, MD

## 2013-10-20 NOTE — Telephone Encounter (Signed)
Pt was switched from [facility] to [facility] per management. Pt stated her insurance only covers 90 day supply of her long term medication. The last time Insulin Glargine (LANTUS SOLOSTAR) 100 unit/mL (3 mL) Pen pt states she was only prescribed 1 box, which is not even a full month. Pt has not been taking the medication at night to make the medication last the whole month. Pt is requesting to have a new Rx sent to the preferred pharmacy selected for a 90day supply    Please advise.

## 2013-10-20 NOTE — Telephone Encounter (Signed)
Pt was switched from [facility] to [place] per management

## 2013-10-21 ENCOUNTER — Other Ambulatory Visit: Payer: Self-pay | Admitting: Family Medicine

## 2013-10-21 MED ORDER — INSULIN GLARGINE (U-100) 100 UNIT/ML (3 ML) SUBCUTANEOUS PEN: PEN_INJECTOR | SUBCUTANEOUS | Status: DC

## 2013-10-21 NOTE — Telephone Encounter (Signed)
Prescription for 60 syringe sent. Betsey Amen, MD

## 2013-10-21 NOTE — Telephone Encounter (Signed)
Patient requesting a 90 day supply

## 2013-10-24 ENCOUNTER — Ambulatory Visit: Payer: Medicare Other | Attending: Family Medicine | Admitting: Family Medicine

## 2013-10-24 ENCOUNTER — Encounter: Payer: Self-pay | Admitting: Family Medicine

## 2013-10-24 VITALS — BP 146/73 | HR 82 | Temp 99.1°F | Resp 16 | Wt 181.0 lb

## 2013-10-24 DIAGNOSIS — N390 Urinary tract infection, site not specified: Secondary | ICD-10-CM

## 2013-10-24 LAB — POC URINALYSIS-DIPSTICK
POC GLUCOSE URINE: NEGATIVE
POC KETONES: NEGATIVE
POC NITRITE URINE: POSITIVE
POC PH URINALYSIS: 5.5
POC PROTEIN URINE: 300
POC SP GRAVITY: 1.025
POC UROBILINOGEN: 0.2

## 2013-10-24 MED ORDER — CIPROFLOXACIN 500 MG TABLET
500.0000 mg | ORAL_TABLET | Freq: Two times a day (BID) | ORAL | Status: AC
Start: 2013-10-24 — End: 2013-10-31

## 2013-10-24 NOTE — Progress Notes (Signed)
SUBJECTIVE: Paula Daugherty is a 68yrold female.   She is a patient of NJamelle Haring MD.  She comes in for Urinary Tract Infection      She has noted urinary frequency, urgency and dysuria that started about 5 days ago.   Fever: no  Hematuria: yes -- this morning  Symptoms improved by nothing. Tried nothing.    Symptoms worsened by urinating  History:  Prior pyelonephritis: no  Prior UTI: none in the Daugherty 2 months     ROS:  General:  No fever or chills.  GI: No abdominal pain, flank pain  GYN: Patient's last menstrual period was 05/06/1983.   no vaginal discharge              Patient Active Problem List   Diagnosis    DM2 (diabetes mellitus, type 2)    Depression with anxiety    Stress at home    Leg cramps-right calf    Right ear pain    Fibromyalgia    HTN (hypertension)    GERD (gastroesophageal reflux disease)    Hyperlipidemia LDL goal < 70    Vitamin D insufficiency    Abnormal LFTs    Renal cyst ( 6.4 cm cyst left kidney)     Allergies to medications: reviewed and updated  Medications: reviewed and updated    Social History  History   Substance Use Topics    Smoking status: Former Smoker    Smokeless tobacco: Not on file    Alcohol Use: Not on file       REVIEW OF SYSTEMS  General: no new tiredness, no weight loss or gain, no changes in appetite.   Other review of systems: as above     OBJECTIVE:  BP 146/73  Pulse 82  Temp(Src) 37.3 C (99.1 F) (Tympanic)  Resp 16  Wt 82.101 kg (181 lb)  LMP 05/06/1983  She appears well, in no apparent distress.  Alert and oriented times three, pleasant and cooperative.  Abdomen: no suprapubic tenderness to palpation   Kidney: No CVA tenderness      DATA:  Urine dipstick shows positive for nitrites, WBC's, positive for RBC's and positive for protein. Results of these tests were reviewed with Paula Daugherty  Detailed chart review was provided.  Review was helpful in medical decision making.     ASSESSMENT AND PLAN (As discussed with Paula Daugherty:  (599.0)  Acute UTI  (primary encounter diagnosis)  Comment: New problem to examiner. Uncertain cause. Uncertain prognosis.   Recommend work up: antibiotics and culture.     Differential diagnoses reviewed.   Plan: POC URINALYSIS-DIPSTICK, CULTURE URINE, BACTI,         Ciprofloxacin (CIPRO) 500 mg Tablet, CULTURE         URINE, BACTI  Push fluids, take antibiotics as directed, she may use OTC pyrridium as desired. Discussed potential risk of pyelonephritis as a complication. Discussed resistant organisms.. Paula Daugherty check glucoses (on ciprofloxacin). Discussed drug interaction - extra time spent reviewing.              Routine medication risks, complications, and contraindications reviewed.  Paula Daugherty and accepts the medication(s).  Call or return to clinic prn if these symptoms worsen or fail to improve as anticipated.  Reviewed pertinent history with Paula Daugherty  Daugherty medical history: Yes  Social history: Yes  Family history: no   I have updated any new information in [place]  Barriers to learning assessed: none.  Paula Daugherty verbalized understanding of teaching and instructions.  No guarantees were made regarding medical care or treatment outcome.    Visit time about 15 minutes.      Paula Daugherty was seen and examined by Rayburn Felt MD     Electronically signed by:  Rayburn Felt, M.D.  Diplomate, [facility]

## 2013-10-24 NOTE — Nursing Note (Signed)
>>   Francis Dowse, MA     Mon Oct 24, 2013  8:50 AM  Vital signs taken, allergies verified, screened for pain, pharmacy verified Francis Dowse, MA

## 2013-10-26 ENCOUNTER — Encounter: Payer: Self-pay | Admitting: Family Medicine

## 2013-10-26 LAB — CULTURE URINE, BACTI

## 2013-10-26 MED ORDER — METFORMIN 500 MG TABLET: 500.0000 mg | ORAL_TABLET | Freq: Two times a day (BID) | ORAL | Status: DC

## 2013-10-26 NOTE — Telephone Encounter (Signed)
From: [name]  To: [name], MD  Sent: 10/25/2013 8:17 PM PDT  Subject: Non-urgent Medical Advice Question    Hello Dr. I realize that we don't meet until the end of July but my Metformin 500 mg bid needs a new Rx. My insurance carrier prefers that long term meds such as this be ordered for 90 day periods. I am hoping you will be ok with that. Still trying to recover from Acute UTI. Painful but a little better. Maybe doing to much. Look to meeting you. [name]

## 2013-11-09 ENCOUNTER — Ambulatory Visit: Payer: Medicare Other | Admitting: Family Medicine

## 2013-11-09 ENCOUNTER — Encounter: Payer: Self-pay | Admitting: Family Medicine

## 2013-11-09 VITALS — BP 134/76 | HR 72 | Temp 97.5°F | Resp 16 | Wt 181.0 lb

## 2013-11-09 DIAGNOSIS — F418 Other specified anxiety disorders: Secondary | ICD-10-CM

## 2013-11-09 DIAGNOSIS — F341 Dysthymic disorder: Secondary | ICD-10-CM

## 2013-11-09 MED ORDER — BUPROPION HCL XL 150 MG 24 HR TABLET, EXTENDED RELEASE
150.0000 mg | EXTENDED_RELEASE_TABLET | Freq: Every day | ORAL | Status: DC
Start: 2013-11-09 — End: 2014-03-14

## 2013-11-09 NOTE — Nursing Note (Signed)
>>   Alyssa Grove, MA     Wed Nov 09, 2013 11:19 AM  Vital signs taken, allergies verified, screened for pain, pharmacy verified. Alyssa Grove MA

## 2013-11-09 NOTE — Progress Notes (Signed)
SUBJECTIVE: Paula Daugherty is a 68yrold female.   She is a patient of Paula Haring Daugherty.  Chief Complaint   Patient presents with    Depression     f up         She complains of 6 months of depression symptoms.  Symptoms are worsening.  She would like to restart Wellbutrin, which has worked for her symptoms in the past.   Current stressors:Yes- stopped Wellbutrin (she is not sure why) and has been moving her residence    Tiredness:Yes-   Irritable:Yes-   Tearfulness:Yes-   Poor sleep:Yes-   Loss of self esteem:no  Anhedonia:Yes  Trouble concentrating and forgetfulness:yes -  reduced motivation as well.  Suicidal Thoughts:no  Family history of depression:Yes-            Patient Active Problem List   Diagnosis    DM2 (diabetes mellitus, type 2)    Depression with anxiety    Stress at home    Leg cramps-right calf    Right ear pain    Fibromyalgia    HTN (hypertension)    GERD (gastroesophageal reflux disease)    Hyperlipidemia LDL goal < 70    Vitamin D insufficiency    Abnormal LFTs    Renal cyst ( 6.4 cm cyst left kidney)     Allergies to medications: reviewed and updated  Medications: reviewed and updated    Social History  History   Substance Use Topics    Smoking status: Former Smoker       REVIEW OF SYSTEMS  General: moderate tiredness, no weight loss or gain, no changes in appetite.  Head: no headaches, no dizziness or syncope.  Eyes: no visual changes.  Lungs: no prolonged cough, no dyspnea.  CV: no new exertional chest pain, no palpitations.    OBJECTIVE:  BP 134/76  Pulse 72  Temp(Src) 36.4 C (97.5 F) (Tympanic)  Resp 16  Wt 82.101 kg (181 lb)  LMP 05/06/1983  She appears well, in no apparent distress.  Alert and oriented times three, pleasant and cooperative.  Mental Status:   Appearance, Behavior, Level of Alertness & Orientation: She appears her  stated age.  Neatly attired in business casual clothing. Engages in an appropriate and polite manner without obvious irritability.   Gait &  Station: unremarkable  Mood: She appears more anxious than depressed today, otherwise she is receptive  Affect: congruent, reactive, full-range  Speech: rapid but non-pressured, spontaneous, well-enunciated. Long answers.  Thought Process: linear and goal-directed  Thought Content: mainly question driven, no delusional content elicited or expressed. Main themes: She would like to restart Wellbutrin.    Perceptions: No  Evidence for auditory or visual hallucinations elicited  Cognition, Attention & Concentration, Memory Deficits: Grossly intact  Insight: fair  Judgment: fair       DATA:  Detailed chart review was provided.  Review was helpful in medical decision making.     ASSESSMENT AND PLAN (As discussed with Paula Daugherty:  (300.4) Depression with anxiety  (primary encounter diagnosis)  Comment: New problem to examiner. Uncertain cause. Uncertain prognosis.   Recommend workup: trial of medication. Detailed discussion provided regarding medication treatment options available to her.   Differential diagnoses reviewed.   Plan: BuPROPion (WELLBUTRIN XL) 150 mg XL tablet.  Continue Cymbalta.        Routine wellbutrin risks, complications, and contraindications reviewed.  Paula Daugherty monitor for increasing anxiety while on wellbutrin. Paula Daugherty and accepts the medication(s). Ms.  Daugherty has followup appointment in approximately 4 weeks.       Routine medication risks, complications, and contraindications reviewed.  Paula Daugherty understands and accepts the medication(s).  Call or return to clinic as needed if these symptoms worsen or fail to improve as anticipated.     Barriers to learning assessed: none.  Paula Daugherty was given the opportunity to ask questions and these were addressed today. She verbalized understanding of teaching and instructions.    No guarantees were made regarding medical care or treatment outcome.    I reviewed past medical, family/social and medication history during the visit.         Paula Daugherty was seen and examined by Paula Daugherty     Electronically signed by:  Paula Daugherty, M.D.  Diplomate, [facility] [place]     This note was created using the support of Dragon Medical 10.1.  Please note any grammatical, sound-alike, or syntax errors as likely dictation errors.

## 2013-11-09 NOTE — Patient Instructions (Signed)
Follow-up in 1 month with Dr. [name].     [place] / Medicare / Medical / Crisis   You can access psychiatry benefits directly (without a referral) by contacting your insurance and receiving an authorization.     Suicide Hotline - [phone]     Abe People, PsyD   [place], [place]   [place], [place]   [phone]   [facility] Psychiatry / Medicare   [address]   [place] [address]   [phone]   Able to schedule appointment within 1-2 weeks   Medication Management for mental disorders     [facility]   [address]   [place], [place] [address]   Phone: [phone]   [place] ([place] )   [place], [place]   [phone]     Dr Isaac Bliss   [address]   [place]   [phone]   [facility]     [facility]   [address], [place]   [place]   [phone]   Medicare     Dr. Tobie Lords   [address]   [place]   [phone]   [facility]   [address] [place], [address]   [phone]   Most Medicare patients are seen by nurse practitioner   Limited counseling also available   Generally able to schedule appointment within 1-2 weeks     [facility]   [address]   [place] [address]   [phone]   Medicare and some medical plans     [place]   [address] [place], [place] [address] at [employer]   [phone]   Dental   Medical services   Psychiatry and counseling   [place] / Medicare / Other insurance     For MediCal Only / or [place] Access   [phone]   Must be significantly impaired by mental illness, low risk cases not accepted   Limited therapy available   For chronic and severe mental  illness, case management available.   Intensive case management for mentally hll homeless individuals     [place] / Medicare   [address]   [place], [place]   [phone]   Medication management / Psychiatry     [facility]   [address] [place], [place] [address]   [phone]   Counseling & Psychiatry available   1-2 month wait for appointment   Health care clinic   [place] abuse   Accepts MediCal / Medicare   Lower cost psychiatry     [place]. [place] [address]   [phone]   No insurance accepted   $160 Initial assessment   Follow up appointments approx. $90   Medical assessment also included     [facility] / Medicare   [place] [address] [address]   [phone]   Suboxone & Pain Management   Eating disorders   Substance abuse   Medication management for mental disorders  [place] - [phone]   24 hour day crisis center   Support counselors   Free   [place] only

## 2013-11-16 ENCOUNTER — Ambulatory Visit
Admission: RE | Admit: 2013-11-16 | Discharge: 2013-11-16 | Disposition: A | Discharge status: Home / Self Care | Payer: Medicare Other | Source: Ambulatory Visit | Attending: Urology | Admitting: Urology

## 2013-11-16 DIAGNOSIS — N281 Cyst of kidney, acquired: Secondary | ICD-10-CM

## 2013-11-22 ENCOUNTER — Encounter: Payer: Self-pay | Admitting: Family Medicine

## 2013-11-22 NOTE — Telephone Encounter (Signed)
From: [name]  To: [name], MD  Sent: 11/22/2013 3:52 AM PDT  Subject: Non-urgent Medical Advice Question    I have an appointment for July 28th as a f/u to an MRI. 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 Does dr still want to see me?Also what next. My daughter is in [place]. She had an acute stroke this am. On life support My needs are big. Time little. Thanks

## 2013-11-29 ENCOUNTER — Ambulatory Visit: Payer: Self-pay | Admitting: Family Medicine

## 2013-12-06 ENCOUNTER — Telehealth: Payer: Self-pay | Admitting: Family Medicine

## 2013-12-06 DIAGNOSIS — R7989 Other specified abnormal findings of blood chemistry: Secondary | ICD-10-CM

## 2013-12-06 DIAGNOSIS — R945 Abnormal results of liver function studies: Secondary | ICD-10-CM

## 2013-12-06 DIAGNOSIS — E119 Type 2 diabetes mellitus without complications: Secondary | ICD-10-CM

## 2013-12-06 NOTE — Progress Notes (Signed)
A laboratory test has been ordered for you.  No fasting or appointment needed.

## 2013-12-06 NOTE — Progress Notes (Signed)
Left message for patient w/ below.

## 2013-12-06 NOTE — Telephone Encounter (Signed)
Patient called to schedule to schedule follow medication appointment and would like to know if PCP wants her to have any labs done prior to appointment.  Patient requests a reply via MyChart please.

## 2013-12-08 ENCOUNTER — Ambulatory Visit: Payer: Medicare Other | Attending: Family Medicine

## 2013-12-08 DIAGNOSIS — R7989 Other specified abnormal findings of blood chemistry: Secondary | ICD-10-CM | POA: Insufficient documentation

## 2013-12-08 DIAGNOSIS — R945 Abnormal results of liver function studies: Secondary | ICD-10-CM

## 2013-12-08 DIAGNOSIS — E119 Type 2 diabetes mellitus without complications: Secondary | ICD-10-CM

## 2013-12-08 LAB — COMPREHENSIVE METABOLIC PANEL
Alanine Transferase (ALT): 60 U/L — ABNORMAL HIGH (ref 5–54)
Albumin: 4 g/dL (ref 3.2–4.6)
Alkaline Phosphatase (ALP): 125 U/L — ABNORMAL HIGH (ref 35–115)
Aspartate Transaminase (AST): 64 U/L — ABNORMAL HIGH (ref 15–43)
Bilirubin Total: 0.8 mg/dL (ref 0.3–1.3)
Calcium: 9.9 mg/dL (ref 8.6–10.5)
Carbon Dioxide Total: 29 mEq/L (ref 24–32)
Chloride: 100 mEq/L (ref 95–110)
Creatinine Blood: 0.89 mg/dL (ref 0.44–1.27)
E-GFR, African American: >60 SEE NOTE (ref >60)
E-GFR, Non-African American: >60 SEE NOTE (ref >60)
Glucose: 157 mg/dL — ABNORMAL HIGH (ref 70–99)
Potassium: 4 mEq/L (ref 3.3–5.0)
Protein: 8 g/dL (ref 6.3–8.3)
Sodium: 142 mEq/L (ref 135–145)
Urea Nitrogen, Blood (BUN): 15 mg/dL (ref 8–22)

## 2013-12-08 LAB — URINALYSIS AND CULTURE IF IND
*Urine Volume: 4
Bilirubin Urine: NEGATIVE
Glucose Urine: NEGATIVE mg/dL
Ketones: NEGATIVE mg/dL
Nitrite Urine: NEGATIVE
Occult Blood Urine: NEGATIVE mg/dL
Protein Urine: 30 mg/dL — AB
RBC: 3 /HPF (ref 0–5)
Specific Gravity: 1.024 (ref 1.002–1.030)
Squamous EPI: 5 /HPF (ref 0–5)
Trans Epi: 1 /HPF (ref 0–5)
Urobilinogen.: 2 mg/dL (ref ?–2.0)
WBC: 15 /HPF — ABNORMAL HIGH (ref 0–5)
pH URINE: 5 (ref 4.8–7.8)

## 2013-12-08 LAB — HEPATITIS C AB SCREEN: HEPATITIS C Ab SCREEN: NONREACTIVE

## 2013-12-08 LAB — HEPATITIS B SURFACE ANTIBODY
HEPATITIS B SURFACE Ab, QUANT: 153.46 m[IU]/mL
HEPATITIS B SURFACE Ab: REACTIVE

## 2013-12-08 LAB — TSH WITH FREE T4 REFLEX: Thyroid Stimulating Hormone: 1.58 u[IU]/mL (ref 0.35–3.30)

## 2013-12-08 LAB — HEMOGLOBIN A1C
Hgb A1C,Glucose Est Avg: 154 mg/dL
Hgb A1C: 7 % — ABNORMAL HIGH (ref 3.9–5.6)

## 2013-12-08 LAB — CREATININE SPOT URINE: Creatinine Spot Urine: 225.81 mg/dL

## 2013-12-08 LAB — MICROALBUMIN
Microalbumin Urine: 13.4 mg/dl
Microalbumin/Creatinine Ratio: 59 mg/g CR — ABNORMAL HIGH (ref <30)

## 2013-12-08 LAB — INR: INR: 1.02 (ref 0.87–1.18)

## 2013-12-08 LAB — VITAMIN D, 25 HYDROXY: Vitamin D, 25 Hydroxy: 30.2 ng/mL (ref 30.0–100.0)

## 2013-12-08 LAB — GAMMA GLUTAMYL TRANSFERASE(GGT: Gamma Glutamyl Transferase(GGT: 96 U/L — ABNORMAL HIGH (ref 0–45)

## 2013-12-08 LAB — HEPATITIS B SURFACE ANTIGEN: Hepatitis B Surface Antigen: NONREACTIVE

## 2013-12-09 LAB — AFP CANCER MARKER: AFP Cancer Marker: 5.2 ng/mL (ref <=8.8)

## 2013-12-10 LAB — ALKALINE PHOSPHATASE ISOENZYME SENDOUT
ALK-PHOSPHATASE BONE CALC: 62 U/L — ABNORMAL HIGH (ref 0–55)
ALK-PHOSPHATASE LIVER CALC: 76 U/L (ref 0–94)
ALK-PHOSPHATASE OTHER CALC: 0 U/L
ALK-PHOSPHATASE: 138 U/L — ABNORMAL HIGH (ref 40–120)

## 2013-12-10 LAB — CULTURE URINE, BACTI

## 2013-12-13 ENCOUNTER — Encounter: Payer: Self-pay | Admitting: Family Medicine

## 2013-12-13 ENCOUNTER — Ambulatory Visit (INDEPENDENT_AMBULATORY_CARE_PROVIDER_SITE_OTHER): Payer: Medicare Other | Admitting: Family Medicine

## 2013-12-13 ENCOUNTER — Ambulatory Visit
Admission: RE | Admit: 2013-12-13 | Discharge: 2013-12-13 | Disposition: A | Discharge status: Home / Self Care | Payer: Medicare Other | Source: Ambulatory Visit | Attending: Family Medicine | Admitting: Family Medicine

## 2013-12-13 VITALS — BP 125/73 | HR 89 | Temp 97.4°F | Resp 16 | Wt 182.0 lb

## 2013-12-13 DIAGNOSIS — R05 Cough: Principal | ICD-10-CM

## 2013-12-13 DIAGNOSIS — F341 Dysthymic disorder: Secondary | ICD-10-CM

## 2013-12-13 DIAGNOSIS — R945 Abnormal results of liver function studies: Secondary | ICD-10-CM

## 2013-12-13 DIAGNOSIS — R059 Cough, unspecified: Secondary | ICD-10-CM

## 2013-12-13 DIAGNOSIS — Z23 Encounter for immunization: Secondary | ICD-10-CM

## 2013-12-13 DIAGNOSIS — N281 Cyst of kidney, acquired: Secondary | ICD-10-CM

## 2013-12-13 DIAGNOSIS — E119 Type 2 diabetes mellitus without complications: Secondary | ICD-10-CM

## 2013-12-13 DIAGNOSIS — R7989 Other specified abnormal findings of blood chemistry: Secondary | ICD-10-CM

## 2013-12-13 DIAGNOSIS — F418 Other specified anxiety disorders: Secondary | ICD-10-CM

## 2013-12-13 MED ORDER — GLIPIZIDE ER 10 MG TABLET, EXTENDED RELEASE 24 HR
1.0000 | EXTENDED_RELEASE_CAPSULE | Freq: Every day | ORAL | Status: DC
Start: 2013-12-13 — End: 2014-08-22

## 2013-12-13 MED ORDER — METFORMIN 1,000 MG TABLET: 1.0000 | ORAL_TABLET | Freq: Two times a day (BID) | ORAL | Status: DC

## 2013-12-13 MED ORDER — FLUTICASONE PROPIONATE 50 MCG/ACTUATION NASAL SPRAY,SUSPENSION
2.0000 | Freq: Every day | NASAL | Status: AC
Start: 2013-12-13 — End: 2015-01-17

## 2013-12-13 NOTE — Progress Notes (Signed)
Paula Daugherty is a 55yrhere for follow-up with PCP  Chief Complaint   Patient presents with    Medication Follow Up     1. DMII: would like to get off Insulin; not sure why she was kept on Glipizide with Insulin; patient would like to continue glipizide and Metformin while with increase both in order to stop Lantus and see   2. Elevated Alk Phos: GGT elevated without bili elevated; possible bone fraction elevated; patient amenable to check labs  3. Cough: since 03/2013 and associated with scratchy throat; on PPI; not on ACE inh     ROS:   .Constitutional: no fever/chills.  CV: no chest pain, palpitations, shortness of breath or edema  Psych: feeling better as daughter sees neurology for possible CVA.    Patient Active Problem List   Diagnosis    DM2 (diabetes mellitus, type 2)    Depression with anxiety    Stress at home    Leg cramps-right calf    Right ear pain    Fibromyalgia    HTN (hypertension)    GERD (gastroesophageal reflux disease)    Hyperlipidemia LDL goal < 70    Vitamin D insufficiency    Abnormal LFTs    Renal cyst ( 6.4 cm cyst left kidney)       Current Outpatient Prescriptions on File Prior to Visit   Medication Sig Dispense Refill    Amlodipine (NORVASC) 10 mg Tablet Take 1 tablet by mouth every day.  30 tablet  11    Aspirin 81 mg DR Tablet EC Tablet Take 1 tablet by mouth every day.  30 tablet  5    Atorvastatin (LIPITOR) 20 mg Tablet Take 0.5 tablets by mouth every day.  90 tablet  1    BuPROPion (WELLBUTRIN XL) 150 mg XL tablet Take 1 tablet by mouth every morning.  30 tablet  3    Cholecalciferol, Vitamin D3, (VITAMIN D-3) 2,000 unit Tablet Take 1 tablet by mouth every day.        DiphenhydrAMINE ([employer] ALLERGY) 25 mg Tablet Tablet Take 1-2 tablets by mouth every 4 to 6 hours if needed. Indications: Allergic Reactions        Duloxetine (CYMBALTA) 30 mg Delayed Release Capsule Take 3 capsules by mouth every day.  270 capsule  3    Hydrocodone 5 mg/Acetaminophen 500 mg  (VICODIN) 5-500 mg Tablet Take 1 tablet by mouth two times daily if needed for pain (severe).        Insulin Glargine (LANTUS SOLOSTAR) 100 unit/mL (3 mL) Pen INJECT 64 UNITS SUBCUTANEOUSLY EVERY DAY AT BEDTIME. INJECT SUBQ AT BEDTIME. (DIABETES)  60 syringe  3    Omeprazole (PRILOSEC) 20 mg Delayed Release Capsule Take 1 capsule by mouth every morning. (acid)  30 capsule  1    Oxybutynin (DITROPAN) 5 mg Tablet Take 5 mg by mouth 3 times daily.         No current facility-administered medications on file prior to visit.     Allergies:   Allergies   Allergen Reactions    Contrast Dye [Radiopaque Agent] Hives    Hydrochlorothiazide Other-Reaction in Comments     Patient reports    Januvia [Sitagliptin] Other-Reaction in Comments     Patient reports    Lyrica [Pregabalin] Other-Reaction in Comments     Patient reports    Omnipaque [Iohexol] Other-Reaction in Comments     Patient reports    Prozac [Fluoxetine Hcl] Other-Reaction in Comments  Patient reports    Sulfa (Sulfonamide Antibiotics) Rash    Topiramate Other-Reaction in Comments     Patient reports       History     Social History    Marital Status: MARRIED     Spouse Name: N/A     Number of Children: 1    Years of Education: N/A     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[employer]       Social History Main Topics    Smoking status: Former Smoker -- 0.10 packs/day for 20 years    Smokeless tobacco: Never Used    Alcohol Use: Yes      Comment: rare shares a beer or glass wine     Drug Use: No    Sexual Activity: Yes     Partners: Male     Other Topics Concern    Not on file     Social History Narrative    No narrative on file     Family History   Problem Relation Age of Onset    Diabetes Father     Heart Mother      MI at 3s     Non-contributory Brother     Non-contributory Brother        PE:  BP 125/73  Pulse 89  Temp(Src) 36.3 C (97.4 F) (Tympanic)  Resp 16  Wt 82.555 kg (182 lb)  LMP 05/06/1983  .General  Appearance: healthy, alert, no distress, pleasant affect, cooperative.  Heart:  normal rate and regular rhythm, no murmurs, clicks, or gallops.  Lungs: clear to auscultation.  Extremities:  no cyanosis, clubbing, or edema.  Mental Status: appropriate affect, behavior and mentation     Assessment and Plan:  (786.2) Cough  (primary encounter diagnosis)  Comment: x over 6 mos; unclear diagnosis or etiology; possible allergies; possible GERD   Plan: CBC WITH DIFFERENTIAL, CHEST 2 VIEWS,         Fluticasone (FLONASE) 50 mcg/actuation nasal         spray, PULMONARY FUNCTION TEST COMPLETE  (PFT)         - PULMONARY LAB, PULMONARY REFERRAL          (790.6) Abnormal LFTs  Comment: elevated ALk Phos--see HPI  Plan: PARATHYROID HORMONE INTACT          (753.10) Renal cyst ( 6.4 cm cyst left kidney)  Comment: per previous imaging   Plan: US ABDOMEN, COMPLETE        To recheck status     (300.4) Depression with anxiety  Comment: on wellbutrin and Cymbalta--no apparent help from Cymbalta   Plan: patient has option to consider decrease Cymbalta over time     (250.00) DM2 (diabetes mellitus, type 2)  Comment: patient would like to get off Lantus   Plan: Metformin (GLUCOPHAGE) 1,000 mg tablet,         GlipiZIDE (GLUCOTROL XL) 10 mg SR Tablet,         URINALYSIS AND CULTURE IF IND, PROTEIN         ELECTROPHORESIS,SERUM, PROTEIN         ELECTROPHORESIS,URINE, CREATINE KINASE          (V03.82) Need for pneumococcal vaccination  Comment: due  Plan: PNEUMOCOCCAL VACCINE,23-VALENT,ADULT            Total encounter time including history, physical examination, and coordination of care was approximately 40 minutes of which more than 50% was spent counseling regarding assessment/diagnosis  and treatment plan. No guarantees were made regarding her medical care or treatment outcome. Barriers to Learning: none.  Patient verbalizes understanding of teaching and instructions.    Electronically signed by:    [name], MD  [place], [place] Board  of Family Medicine  Associate physician [facility], [place]   [phone]

## 2013-12-13 NOTE — Nursing Note (Signed)
>>   Paula Daugherty     Tue Dec 13, 2013  8:07 AM  Vital signs taken, allergies verified, screened for pain,   Paula Hocking [place], MA

## 2013-12-13 NOTE — Patient Instructions (Addendum)
A laboratory test has been ordered for you.      An X-ray has been ordered for you. Please go to the front desk at [address], [place]  [place] to obtain your x-ray.      You have been referred to a specialist. We are pleased to offer the services of [place] specialists located at the [facility] or [place] sites. Please allow 7 business days (3 if urgent) for the referral to be processed. After that time you may call: Pulmonary/Sleep: [place] [address], [phone]      A pulmonary function test has been ordered for you. You may call [phone] option 5 to schedule an appointment.     Start Flonase DAILY--one spray per nostril with head looking down and shaking bottle prior to each use and tip pointed straight up--use this as directed; also needs saline water sprays at least twice per day   Call or return to clinic if symptoms worsen, fail to improve  or other symptoms develop.

## 2013-12-14 ENCOUNTER — Ambulatory Visit
Admission: RE | Admit: 2013-12-14 | Discharge: 2013-12-14 | Disposition: A | Discharge status: Home / Self Care | Payer: Medicare Other | Source: Ambulatory Visit | Attending: Family Medicine | Admitting: Family Medicine

## 2013-12-14 ENCOUNTER — Ambulatory Visit (INDEPENDENT_AMBULATORY_CARE_PROVIDER_SITE_OTHER): Payer: Medicare Other | Admitting: Internal Medicine

## 2013-12-14 ENCOUNTER — Encounter: Payer: Self-pay | Admitting: Internal Medicine

## 2013-12-14 ENCOUNTER — Ambulatory Visit: Payer: Medicare Other | Attending: Family Medicine

## 2013-12-14 VITALS — BP 138/88 | HR 82 | Temp 99.1°F | Wt 183.0 lb

## 2013-12-14 DIAGNOSIS — R7989 Other specified abnormal findings of blood chemistry: Secondary | ICD-10-CM

## 2013-12-14 DIAGNOSIS — E119 Type 2 diabetes mellitus without complications: Secondary | ICD-10-CM

## 2013-12-14 DIAGNOSIS — R945 Abnormal results of liver function studies: Secondary | ICD-10-CM

## 2013-12-14 DIAGNOSIS — R059 Cough, unspecified: Secondary | ICD-10-CM

## 2013-12-14 DIAGNOSIS — K219 Gastro-esophageal reflux disease without esophagitis: Secondary | ICD-10-CM

## 2013-12-14 DIAGNOSIS — R05 Cough: Principal | ICD-10-CM

## 2013-12-14 LAB — URINALYSIS AND CULTURE IF IND
Bilirubin Urine: NEGATIVE
Glucose Urine: NEGATIVE mg/dL
Ketones: NEGATIVE mg/dL
Nitrite Urine: NEGATIVE
Occult Blood Urine: NEGATIVE mg/dL
Protein Urine: 50 mg/dL — AB
RBC: 1 /HPF (ref 0–5)
Specific Gravity: 1.023 (ref 1.002–1.030)
Squamous EPI: 4 /HPF (ref 0–5)
Trans Epi: 1 /HPF (ref 0–5)
Urobilinogen.: 2 mg/dL (ref ?–2.0)
WBC: 6 /HPF — ABNORMAL HIGH (ref 0–5)
pH URINE: 5.5 (ref 4.8–7.8)

## 2013-12-14 LAB — CBC WITH DIFFERENTIAL
Basophils % Auto: 0.4 %
Basophils Abs Auto: 0 10*3/uL (ref 0–0.2)
Eosinophils % Auto: 1.8 %
Eosinophils Abs Auto: 0.2 10*3/uL (ref 0–0.5)
Hematocrit: 42.6 % (ref 36–46)
Hemoglobin: 13.8 g/dL (ref 12.0–16.0)
Lymphocytes % Auto: 29.4 %
Lymphocytes Abs Auto: 3.2 10*3/uL (ref 1.0–4.8)
MCH: 27.7 pg (ref 27–33)
MCHC: 32.3 % (ref 32–36)
MCV: 85.8 UM3 (ref 80–100)
MPV: 8.5 UM3 (ref 6.8–10.0)
Monocytes % Auto: 7.7 %
Monocytes Abs Auto: 0.8 10*3/uL (ref 0.1–0.8)
Neutrophils % Auto: 60.7 %
Neutrophils Abs Auto: 6.6 10*3/uL (ref 1.80–7.70)
Platelet Count: 284 10*3/uL (ref 130–400)
RDW: 14 UNITS (ref 0–14.7)
Red Blood Cell Count: 4.96 10*6/uL (ref 4.0–5.2)
White Blood Cell Count: 10.9 10*3/uL (ref 4.5–11.0)

## 2013-12-14 LAB — PARATHYROID HORMONE INTACT: Parathyroid Hormone Intact: 65 pg/mL (ref 12–88)

## 2013-12-14 LAB — CREATINE KINASE: Creatine Kinase: 49 U/L (ref 0–250)

## 2013-12-14 MED ORDER — FEXOFENADINE 180 MG TABLET
180.0000 mg | ORAL_TABLET | Freq: Every day | ORAL | Status: AC
Start: 2013-12-14 — End: 2014-12-09

## 2013-12-14 NOTE — Progress Notes (Signed)
[place]        HISTORY OF PRESENT ILLNESS  Paula Daugherty is a 68yrold female who is referred to the  pulmonary clinic by NJamelle Haring MD for evaluation of intermittent cough. Patient's symptoms have been intermittent, ongoing for couple years. The severity is described as mild and the symptoms are better [place] and particular [place].    [place]  Notice cough started.  Also noted cough when she came here to visit over the 8 years she was gone. Worse at night, seems to be associated with allergic symptoms of watery eyes but not much runny nose,  No phlegm, no hemoptyiss, has frequent chest pain often but cardiologist  [place].   The chest pain events are random and not angina.    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  Hobbies and Pets: travels by car, like to go on cruises, no pets.     I did ROS:A full comprehensive review of systems was performed and was entirely negative except for the pertinent positive noted above.  I did review the medical/surgical and social history listed below No past medical history on file.  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  30 tablet  11    Aspirin 81 mg DR Tablet EC Tablet Take 1 tablet by mouth every day.  30 tablet  5    Atorvastatin (LIPITOR) 20 mg Tablet Take 0.5 tablets by mouth every day.  90 tablet  1    BuPROPion (WELLBUTRIN XL) 150 mg XL  tablet Take 1 tablet by mouth every morning.  30 tablet  3    Cholecalciferol, Vitamin D3, (VITAMIN D-3) 2,000 unit Tablet Take 1 tablet by mouth every day.        DiphenhydrAMINE ([employer] ALLERGY) 25 mg Tablet Tablet Take 1-2 tablets by mouth every 4 to 6 hours if needed. Indications: Allergic Reactions        Duloxetine (CYMBALTA) 30 mg Delayed Release Capsule Take 3 capsules by mouth every day.  270 capsule  3    Fluticasone (FLONASE) 50 mcg/actuation nasal spray Instill 2 sprays into EACH nostril every day.  16 g  1    GlipiZIDE (GLUCOTROL XL) 10 mg SR Tablet Take 1 tablet by mouth every morning with a meal. (diabetes)  30 tablet  5    Hydrocodone 5 mg/Acetaminophen 500 mg (VICODIN) 5-500 mg Tablet Take 1 tablet by mouth two times daily if needed for pain (severe).        Insulin Glargine (LANTUS SOLOSTAR) 100 unit/mL (3 mL) Pen INJECT 64 UNITS SUBCUTANEOUSLY EVERY DAY AT BEDTIME. INJECT SUBQ AT BEDTIME. (DIABETES)  60 syringe  3    Metformin (GLUCOPHAGE) 1,000 mg tablet Take 1 tablet by mouth 2 times daily with meals. (diabetes)  60 tablet  5    Omeprazole (PRILOSEC) 20 mg Delayed Release Capsule Take 1 capsule by mouth every morning.  (acid)  30 capsule  1    Oxybutynin (DITROPAN) 5 mg Tablet Take 5 mg by mouth 3 times daily.         No current facility-administered medications on file prior to visit.       PHYSICAL EXAM  BP 138/88  Pulse 82  Temp(Src) 37.3 C (99.1 F) (Tympanic)  Wt 83.008 kg (183 lb)  SpO2 94%  LMP 05/06/1983  NID:POEUMPN, alert, no distress, pleasant affect, cooperative.  Eyes:  conjunctivae and corneas clear. PERRL, EOM's intact. sclerae normal.  Ears:  normal TMs and canal and external inspection of ears show no abnormality.  Nose:  normal.  Mouth: normal, some posterior oropharangeal cobblestoning.  Neck:  Neck supple. No adenopathy, thyroid symmetric, normal size.  Heart:  normal rate and regular rhythm, no murmurs, clicks, or gallops.  Lungs: clear to auscultation.  Abdomen: BS normal.  Abdomen soft, non-tender.  No masses or organomegaly.  Extremities:  no cyanosis, clubbing, or edema.    CXR: done today, unremarkable for etiology of cough  PFT: ordrered by pcp  LABS: reviewed    IMPRESSION    The patient is a 68yrold female with recurrent intermittent cough seems to be most likely AR/PND.  Alternatives include GERD, Adult onset asthma.    Intermittent cough likely AR  -neilmed rinse bid  -allegra daily  -flonase daily  -pcp ordered pft    GERD: patient has been on ppi for years, no clear other symptoms of gerd, we will trial her off omeprazole.  D/c omeprazole    rtc 2 months.    pulm preventive care:   Flu shot recommended this fall    [employer] 1 AutomotiveD.O.  Assistant Clinical Professor  Pulmonary and Critical Care Medicine

## 2013-12-14 NOTE — Nursing Note (Signed)
>>   Paula Daugherty     Wed Dec 14, 2013  3:27 PM  Vital signs taken, allergies verified, screened for pain, and pharmacy verified.  Tommi Rumps [facility]

## 2013-12-16 ENCOUNTER — Other Ambulatory Visit: Payer: Self-pay | Admitting: Family Medicine

## 2013-12-16 LAB — PROTEIN ELECTROPHORESIS,SERUM
ALBUMIN FRACTION: 4.4 g/dL (ref 3.6–4.7)
ALPHA 1 FRACTION: 0.2 g/dL (ref 0.1–0.3)
ALPHA 2 FRACTION: 1 g/dL (ref 0.6–1.1)
BETA 1 FRACTION: 0.6 g/dL (ref 0.5–0.8)
BETA 2 FRACTION: 0.6 g/dL (ref 0.2–0.6)
GAMMA FRACTION: 1.1 g/dL (ref 0.7–1.5)
PROTEIN, TOTAL: 7.9 g/dL (ref 6.3–8.3)

## 2013-12-16 LAB — CULTURE URINE, BACTI

## 2013-12-16 NOTE — Telephone Encounter (Signed)
Electronic refill request. [place] from [place].  Has she moved?  Please advise.

## 2013-12-18 NOTE — Telephone Encounter (Signed)
Yes, in [place], should come through new MD/clinic.  Thanks.

## 2013-12-19 ENCOUNTER — Other Ambulatory Visit: Payer: Self-pay | Admitting: Family Medicine

## 2013-12-19 NOTE — Telephone Encounter (Signed)
12/13/2013-Last visit w/ PCP

## 2013-12-24 ENCOUNTER — Other Ambulatory Visit: Payer: Self-pay | Admitting: Family Medicine

## 2013-12-26 ENCOUNTER — Encounter: Payer: Self-pay | Admitting: Family Medicine

## 2013-12-26 LAB — PROTEIN ELECTROPHORESIS,URINE
ALBUMIN URINE: 31 mg/dL
GLOBULIN URINE: 32 mg/dL
PROTEIN,TOTAL URINE: 63 mg/dL

## 2013-12-26 NOTE — Telephone Encounter (Signed)
From: [name]  To: [name], MD  Sent: 12/26/2013 3:35 PM PDT  Subject: Non-urgent Medical Advice Question    Dr I see you on the first. Can we talk about how completely exhausted I am all the time. All I want to do [place] sleep. After getting up in the morning I am asleep again in two hours. I need some help.

## 2013-12-27 ENCOUNTER — Telehealth: Payer: Self-pay | Admitting: Gastroenterology

## 2013-12-27 NOTE — Telephone Encounter (Signed)
Phone call to patient - Patient identified using three identifiers.  Appointment confirmed.  Patient advised of colonoscopy preparation. 5 days to procedure no high fiber foods, nuts, seeds etc.  No NSAIDS 5 days prior to procedure.  Day before procedure start clear liquid diet, drink 1st half of prep at 6pm, 4 hours before procedure drink the 2nd half of prep.  Bring a driver.  Patient verbalized understanding and agrees to plan.

## 2013-12-28 ENCOUNTER — Ambulatory Visit
Admission: RE | Admit: 2013-12-28 | Discharge: 2013-12-28 | Disposition: A | Discharge status: Home / Self Care | Payer: Medicare Other | Source: Ambulatory Visit | Attending: Family Medicine | Admitting: Family Medicine

## 2013-12-28 ENCOUNTER — Other Ambulatory Visit: Payer: Self-pay | Admitting: Family Medicine

## 2013-12-28 DIAGNOSIS — N281 Cyst of kidney, acquired: Secondary | ICD-10-CM

## 2013-12-28 DIAGNOSIS — Q619 Cystic kidney disease, unspecified: Secondary | ICD-10-CM

## 2014-01-03 ENCOUNTER — Ambulatory Visit (INDEPENDENT_AMBULATORY_CARE_PROVIDER_SITE_OTHER): Payer: Medicare Other

## 2014-01-03 ENCOUNTER — Encounter: Payer: Self-pay | Admitting: Family Medicine

## 2014-01-03 ENCOUNTER — Ambulatory Visit: Payer: Medicare Other | Attending: Family Medicine | Admitting: Family Medicine

## 2014-01-03 VITALS — BP 141/87 | HR 76 | Temp 97.8°F | Wt 177.6 lb

## 2014-01-03 DIAGNOSIS — R5382 Chronic fatigue, unspecified: Secondary | ICD-10-CM

## 2014-01-03 DIAGNOSIS — R5381 Other malaise: Secondary | ICD-10-CM

## 2014-01-03 DIAGNOSIS — Z23 Encounter for immunization: Secondary | ICD-10-CM | POA: Insufficient documentation

## 2014-01-03 DIAGNOSIS — N281 Cyst of kidney, acquired: Secondary | ICD-10-CM

## 2014-01-03 DIAGNOSIS — R5383 Other fatigue: Secondary | ICD-10-CM

## 2014-01-03 DIAGNOSIS — Q619 Cystic kidney disease, unspecified: Secondary | ICD-10-CM | POA: Insufficient documentation

## 2014-01-03 DIAGNOSIS — R0989 Other specified symptoms and signs involving the circulatory and respiratory systems: Secondary | ICD-10-CM

## 2014-01-03 DIAGNOSIS — E119 Type 2 diabetes mellitus without complications: Secondary | ICD-10-CM | POA: Insufficient documentation

## 2014-01-03 DIAGNOSIS — R0683 Snoring: Secondary | ICD-10-CM

## 2014-01-03 DIAGNOSIS — K219 Gastro-esophageal reflux disease without esophagitis: Secondary | ICD-10-CM | POA: Insufficient documentation

## 2014-01-03 DIAGNOSIS — R748 Abnormal levels of other serum enzymes: Secondary | ICD-10-CM | POA: Insufficient documentation

## 2014-01-03 DIAGNOSIS — R0609 Other forms of dyspnea: Secondary | ICD-10-CM | POA: Insufficient documentation

## 2014-01-03 LAB — MAGNESIUM (MG): Magnesium (Mg): 1.7 mg/dL (ref 1.5–2.6)

## 2014-01-03 NOTE — Nursing Note (Signed)
Vital signs taken, allergies and pharmacy verified. Screened for pain.

## 2014-01-03 NOTE — Progress Notes (Signed)
Paula Daugherty is a 48yrhere for follow-up with PCP  Chief Complaint   Patient presents with    Other     F/U     1. Fatigue: times yrs; poor sleep and reports snoring  2. GERD: on PPI; some symptoms with PPI and possible etiology for cough--PFT pending; patient also due to have colonoscopy and to see if able to get EGD  3. DM: on Lantus and Glipizide; patient verbalized understanding regarding possible low FSBS with this combo and patient would like to get off Lantus; she has increased Metformin and diet and exercise encouraged in order to see if she can stay off Lantus         ROS:   .Constitutional: no fever/chills.  CV: no chest pain, palpitations, shortness of breath or edema  Musculoskeletal: possible fibromyalgia--aware to avoid pain meds--consider swimming and support grp.    Patient Active Problem List   Diagnosis    DM2 (diabetes mellitus, type 2)    Depression with anxiety    Stress at home    Leg cramps-right calf    Right ear pain    Fibromyalgia    HTN (hypertension)    GERD (gastroesophageal reflux disease)    Hyperlipidemia LDL goal < 70    Vitamin D insufficiency    Abnormal LFTs    Renal cyst ( 6.4 cm cyst left kidney)    Cough    Type 2 diabetes mellitus without complication       Current Outpatient Prescriptions on File Prior to Visit   Medication Sig Dispense Refill    Amlodipine (NORVASC) 10 mg Tablet TAKE 1 TABLET BY MOUTH EVERY DAY 90 tablet 1    Aspirin 81 mg DR Tablet EC Tablet Take 1 tablet by mouth every day. 30 tablet 5    Atorvastatin (LIPITOR) 20 mg Tablet Take 0.5 tablets by mouth every day. 90 tablet 1    BuPROPion (WELLBUTRIN XL) 150 mg XL tablet Take 1 tablet by mouth every morning. 30 tablet 3    Cholecalciferol, Vitamin D3, (VITAMIN D-3) 2,000 unit Tablet Take 1 tablet by mouth every day.      DiphenhydrAMINE ([employer]S ALLERGY) 25 mg Tablet Tablet Take 1-2 tablets by mouth every day at bedtime if needed. Indications: Allergic Reactions      Duloxetine (CYMBALTA)  30 mg Delayed Release Capsule Take 3 capsules by mouth every day. 270 capsule 3    Fexofenadine (ALLEGRA) 180 mg tablet Take 1 tablet by mouth every day. 30 tablet 11    Fluticasone (FLONASE) 50 mcg/actuation nasal spray Instill 2 sprays into EACH nostril every day. 16 g 1    GlipiZIDE (GLUCOTROL XL) 10 mg SR Tablet Take 1 tablet by mouth every morning with a meal. (diabetes) 30 tablet 5    Hydrocodone 5 mg/Acetaminophen 500 mg (VICODIN) 5-500 mg Tablet Take 1 tablet by mouth two times daily if needed for pain (severe).      Insulin Glargine (LANTUS SOLOSTAR) 100 unit/mL (3 mL) Pen INJECT 64 UNITS SUBCUTANEOUSLY EVERY DAY AT BEDTIME. INJECT SUBQ AT BEDTIME. (DIABETES) 60 syringe 3    Metformin (GLUCOPHAGE) 1,000 mg tablet Take 1 tablet by mouth 2 times daily with meals. (diabetes) 60 tablet 5    Omeprazole (PRILOSEC) 20 mg Delayed Release Capsule Take 1 capsule by mouth every morning. (acid) 30 capsule 1    Oxybutynin (DITROPAN) 5 mg Tablet Take 5 mg by mouth 3 times daily.       No current facility-administered medications  on file prior to visit.     Allergies:   Allergies   Allergen Reactions    Contrast Dye [Radiopaque Agent] Hives    Hydrochlorothiazide Other-Reaction in Comments     Patient reports    Januvia [Sitagliptin] Other-Reaction in Comments     Patient reports    Lyrica [Pregabalin] Other-Reaction in Comments     Patient reports    Omnipaque [Iohexol] Other-Reaction in Comments     Patient reports    Prozac [Fluoxetine Hcl] Other-Reaction in Comments     Patient reports    Sulfa (Sulfonamide Antibiotics) Rash    Topiramate Other-Reaction in Comments     Patient reports       History     Social History    Marital Status: MARRIED     Spouse Name: N/A     Number of Children: 1    Years of Education: N/A     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[employer]       Social History Main Topics    Smoking status: Former Smoker -- 0.10 packs/day for 20 years    Smokeless tobacco:  Never Used    Alcohol Use: Yes      Comment: rare shares a beer or glass wine     Drug Use: No    Sexual Activity:     Partners: Male     Other Topics Concern    Not on file     Social History Narrative     Family History   Problem Relation Age of Onset    Diabetes Father     Heart Mother      MI at 30s     Non-contributory Brother     Non-contributory Brother        PE:  BP 141/87 mmHg  Pulse 76  Temp(Src) 36.6 C (97.8 F) (Temporal)  Wt 80.559 kg (177 lb 9.6 oz)  LMP 05/06/1983  .General Appearance: healthy, alert, no distress, pleasant affect, cooperative.  Heart:  normal rate and regular rhythm, no murmurs, clicks, or gallops.  Lungs: clear to auscultation.  Extremities:  no cyanosis, clubbing, or edema.  Mental Status: appropriate affect, behavior and mentation     Assessment and Plan:  (780.79) Chronic fatigue  (primary encounter diagnosis)  Comment: also poor sleep and reports snoring   Plan: HELICOBACTER PYLORI ANTIGEN, MAGNESIUM (MG),         SLEEP STUDY          (753.10) Cyst of left kidney  Comment: ultrasound due in 6 mos  Plan: ultrasound ordered     (753.10) Renal cyst ( 6.4 cm cyst left kidney)  Comment: same  Plan: US ABDOMEN, COMPLETE          (530.81) Gastroesophageal reflux disease without esophagitis  Comment: on PPI  Plan: GASTROENTEROLOGY REFERRAL          (790.5) Alkaline phosphatase elevation  Comment: possible bone associated levels; PTH within normal limits   Plan: ENDOCRINOLOGY REFERRAL          (786.09) Snores  Comment: possible etiology for fatigue  Plan: SLEEP STUDY          (250.00) Type 2 diabetes mellitus without complication  Comment: recent A1c at 7; patient wishing to get off Lantus   Plan: DIABETES EDUCATION CLASS          (V06.1) Need for Tdap vaccination  Comment: due  Plan: TDAP VACCINE IM  (ADACEL)  Total encounter time including history, physical examination, and coordination of care was approximately 25 minutes of which more than 50% was spent  counseling regarding assessment/diagnosis and treatment plan. No guarantees were made regarding her medical care or treatment outcome. Barriers to Learning: none.  Patient verbalizes understanding of teaching and instructions.    Electronically signed by:    [name], MD  [place], [place] [facility]  Associate physician [facility], [place]   [phone]

## 2014-01-03 NOTE — Patient Instructions (Addendum)
A laboratory test has been ordered for you.     You have been referred to a specialist. We are pleased to offer the services of [facility] specialists located at the [facility] or [facility] sites. Please allow 7 business days (3 if urgent) for the referral to be processed. After that time you may call: Endocrinology: [place] #0200, [phone]  Pulmonary/Sleep: [place] #400, [phone]       An ultrasound has been ordered for you. Please call [phone] to schedule an appointment Feb 2016  in [place]. You will receive a letter or telephone call with your results.

## 2014-01-04 ENCOUNTER — Encounter: Payer: Self-pay | Admitting: Gastroenterology

## 2014-01-04 ENCOUNTER — Ambulatory Visit: Payer: Medicare Other | Attending: Gastroenterology | Admitting: Gastroenterology

## 2014-01-04 VITALS — BP 137/74 | HR 61 | Temp 98.1°F | Resp 15 | Ht 62.0 in | Wt 177.0 lb

## 2014-01-04 DIAGNOSIS — Z1211 Encounter for screening for malignant neoplasm of colon: Secondary | ICD-10-CM

## 2014-01-04 DIAGNOSIS — K6289 Other specified diseases of anus and rectum: Secondary | ICD-10-CM | POA: Insufficient documentation

## 2014-01-04 DIAGNOSIS — K5730 Diverticulosis of large intestine without perforation or abscess without bleeding: Secondary | ICD-10-CM

## 2014-01-04 DIAGNOSIS — K644 Residual hemorrhoidal skin tags: Secondary | ICD-10-CM | POA: Insufficient documentation

## 2014-01-04 DIAGNOSIS — K626 Ulcer of anus and rectum: Secondary | ICD-10-CM

## 2014-01-04 DIAGNOSIS — D126 Benign neoplasm of colon, unspecified: Secondary | ICD-10-CM | POA: Insufficient documentation

## 2014-01-04 HISTORY — PX: COLONOSCOPY: GILAB00002

## 2014-01-04 MED ORDER — FENTANYL (PF) 50 MCG/ML INJECTION SOLUTION
INTRAMUSCULAR | Status: DC
Start: 2014-01-04 — End: 2014-01-04

## 2014-01-04 MED ORDER — SODIUM CHLORIDE 0.9 % INTRAVENOUS SOLUTION
INTRAVENOUS | Status: DC
Start: 2014-01-04 — End: 2014-01-04

## 2014-01-04 MED ORDER — MIDAZOLAM (PF) 1 MG/ML INJECTION SOLUTION
INTRAMUSCULAR | Status: DC
Start: 2014-01-04 — End: 2014-01-04

## 2014-01-04 MED ORDER — PROMETHAZINE 25 MG/ML INJECTION SOLUTION
INTRAMUSCULAR | Status: DC
Start: 2014-01-04 — End: 2014-01-04

## 2014-01-04 MED ORDER — DIPHENHYDRAMINE 50 MG/ML INJECTION SOLUTION
INTRAMUSCULAR | Status: DC
Start: 2014-01-04 — End: 2014-01-04

## 2014-01-04 NOTE — Patient Instructions (Signed)
Discharge Instructions for colonoscopy    Findings:  colonoscopy - We removed one polyp from your colon ( about 1cm)  You also have some diverticulosis  You also had a small ulcer in the rectum. This may be due to constipation but we took biopsies    Activity:    Rest today, resume usual activitiy tomorrow     Diet:    High fiber diet:     Medication:    Resume all usual medications, Take daily fiber supplementation such as Metamucil, Citrucel, etc.   i would also recommend taking miralax once daily ( it is available over the counter)     Follow up:   Follow-up appointment with Dr Arnetha Massy in Gastroenterology clinic  in 4  week(s)      During your procedure, air was pumped into your GI tract so your doctor could see clearly to make a diagnosis and/or treat your problem.     Some possible side effects you may experience are:   - Discomfort due to a distended (bloated) abdomen which will subside after a few hours to two days.   - Nausea may be a side effect of the medication and will subside.   - The medications you received may make you dizzy and sleepy, it is important that you do not drive, operate machinery or drink alcohol for at least one day.   - Severe pain is not expected and should be reported    Other side effects may include:  Flex. Sig. Or Colonoscopy: A small amount of diarrhea may follow the exam.    If problems, call [facility] lab at [phone] during business hours Monday through Friday 7:30am to 4:30 pm.  After hours call [phone] and ask to speak to the GI Fellow on call.

## 2014-01-04 NOTE — Nursing Note (Signed)
Nursing Pre-Procedure Assessment    Paula Daugherty arrived by ambulating  into clinic today at 0830 from home.      Patient has arranged for a ride home from DJ (husband) who can be contacted at [phone]. Patient agrees to having individual providing ride home present while the post procedure instructions and results are given.  Instructed patient that post sedation they are not to drive or drink alcohol for the remainder of the day.      Verified procedure with the patient scheduled today for: Colonoscopy.    Outpatient Prescriptions Marked as Taking for the 01/04/14 encounter (Office Visit) with Ross Marcus, MD   Medication Sig Dispense Refill    Amlodipine (NORVASC) 10 mg Tablet TAKE 1 TABLET BY MOUTH EVERY DAY 90 tablet 1    Aspirin 81 mg DR Tablet EC Tablet Take 1 tablet by mouth every day. 30 tablet 5    Atorvastatin (LIPITOR) 20 mg Tablet Take 0.5 tablets by mouth every day. 90 tablet 1    BuPROPion (WELLBUTRIN XL) 150 mg XL tablet Take 1 tablet by mouth every morning. 30 tablet 3    Cholecalciferol, Vitamin D3, (VITAMIN D-3) 2,000 unit Tablet Take 1 tablet by mouth every day.      Duloxetine (CYMBALTA) 30 mg Delayed Release Capsule Take 3 capsules by mouth every day. 270 capsule 3    Fexofenadine (ALLEGRA) 180 mg tablet Take 1 tablet by mouth every day. 30 tablet 11    Fluticasone (FLONASE) 50 mcg/actuation nasal spray Instill 2 sprays into EACH nostril every day. 16 g 1    GlipiZIDE (GLUCOTROL XL) 10 mg SR Tablet Take 1 tablet by mouth every morning with a meal. (diabetes) 30 tablet 5    Metformin (GLUCOPHAGE) 1,000 mg tablet Take 1 tablet by mouth 2 times daily with meals. (diabetes) 60 tablet 5    Omeprazole (PRILOSEC) 20 mg Delayed Release Capsule Take 1 capsule by mouth every morning. (acid) 30 capsule 1     Anticoagulants: Patient denies taking Aspirin, NSAIDs, or other anticoagulants for the past 5 days.  OTC/Herbal preparations: none    Reviewed with the patient her health status in  regards to allergies (including latex), medications, pregnancy, hypertension, atrial fibrilation, liver disease, bleeding disorders, diabetes,  CVA's, seizures, sleep apnea, glaucoma, cancer, prosthesis or implants, infectious diseases, surgical history; and disease of heart, lungs, liver, or kidneys. Positive history reviewed and updated in Health History section of the EMR.    Patient Active Problem List   Diagnosis    DM2 (diabetes mellitus, type 2)    Depression with anxiety    Stress at home    Leg cramps-right calf    Right ear pain    Fibromyalgia    HTN (hypertension)    GERD (gastroesophageal reflux disease)    Hyperlipidemia LDL goal < 70    Vitamin D insufficiency    Abnormal LFTs    Renal cyst ( 6.4 cm cyst left kidney)    Cough    Type 2 diabetes mellitus without complication     Allergies   Allergen Reactions    Contrast Dye [Radiopaque Agent] Hives    Hydrochlorothiazide Other-Reaction in Comments     Patient reports    Januvia [Sitagliptin] Other-Reaction in Comments     Patient reports    Lyrica [Pregabalin] Other-Reaction in Comments     Patient reports    Omnipaque [Iohexol] Other-Reaction in Comments     Patient reports    Prozac [Fluoxetine Hcl] Other-Reaction  in Comments     Patient reports    Sulfa (Sulfonamide Antibiotics) Rash    Topiramate Other-Reaction in Comments     Patient reports     No past medical history on file.  Past Surgical History   Procedure Laterality Date    Pr ligate fallopian tube       Tubal ligation    Pr total abdom hysterectomy  1980s     partial; no BSO    Pr knee scope,diagnostic  1980s     Knee arthroscopy    Pr repair tympanic membrane       Tympanoplasty     History   Smoking status    Former Smoker -- 0.10 packs/day for 20 years   Smokeless tobacco    Never Used     History   Drug Use No       Pre Procedure Checks:    Identaband on and two forms of identification information verified: yes  Pt completed bowel prep: GI Procedure  NPO  since: 0400  Belongings are with patient under gurney.  Patient has: Glasses.  Barriers to Learning assessed: none. Patient verbalizes understanding of teaching and instructions.    Past anesthesia responses:  No problem.     Aldrete Score (adapted) Pre Sedation:  Moves 4 extremities voluntarily on command = 2  Spontaneous unlabored respirations = 2  BP changed < 20% of preanesthetic level = 2  Awake, alert, normal response to auditory stimuli = 4  Able to maintain O2 saturation > 92% on room air = 2  TOTAL PRE SEDATION ALDRETE SCORE: 12      Nursing Assessment Data Base:  Ventilation   --  Respirations: normal, unlabored.  Circulation/Perfusion -- Skin: warm, normal color and dry.  Cognition/Communication -- Behavior: alert and oriented and cooperative.  Gastro-Intestinal: no distress.  Abdomen: soft and non-tender.  Level of Ambulation: self.    Nursing assessment completed by:   Helane Rima, RN, RN    Date: 01/04/2014 Time: [phone]

## 2014-01-04 NOTE — Procedures (Addendum)
Patient: Paula Daugherty  Location:   Medical record number: [id number]  Sex: female  Age: 21yrs)  Date of birth: [birth year]   Procedure: Colonoscopy  Subprocedure:Biopsy and Snare cautery polypectomy     Date of Service: 01/04/2014    Endoscope insertion time: 1005  Cecal intubation time: 1015  Endoscope withdrawal time: 1044    Referring Physician: PCP: NJamelle Haring MD    PERFORMING SURGEON: ARoss MarcusI was physically present during the entire procedure(s) indicated herein  ASSISTANT SURGEON:Dr Ebony Rickel Alli-Akintade    GI Pre-procedure Indications:  Screening for colon cancer    Details of the Procedure:    Informed consent was obtained for the procedure, including sedation.  Risks of perforation, hemorrhage, adverse drug reaction, pain, infection, missed lesion, incomplete examination, and aspiration were discussed.  A timeout was performed by nursing and medical staff to identify the patient (using two patient identifiers) and appropriate procedure. The patient was placed in the left lateral decubitus position. The patient was monitored continuously with EKG tracing, pulse oximetry, blood pressure monitoring, and direct observations.      A rectal examination was performed.  The Olympus colonoscope was inserted into the rectum and advanced under direct vision to the cecum, which was identified by the ileocecal valve and appendiceal orifice.  The quality of the colonic preparation was good.  A careful inspection was made as the colonscope was withdrawn, including a retroflexed view of the rectum; findings and interventions are described below.  Photo documentation was obtained.  The patient tolerated the procedure well, and there were no complications.  She was taken to the recovery area in stable condition.      Sedation:  Sedation was administered for single procedure/procedures    Versed 4 mg IV, Fentanyl 100 mcg IV and Benadryl 25 mg IV    Findings and Interventions:      Cecum: polyp(s) -#1, 10 mm in  size,flat, located in the cecum, removed piecemeal by snare cautery and retrieved for pathology  Ascending colon: normal  Transverse colon: normal  Descending colon: normal  Sigmoid colon: diverticulosis, moderate in degree, involving the sigmoid  Rectum: hemorrhoids external small in size  left undisturbed  Single clean based ulcer at the anorectal junction  ~328m biopsy taken, minimal oozing.     Impression:    1) A single benign appearing colon polyp, removed as above.  2) Colonic diverticulosis as described above  3) External hemorrhoids as described above.  4) Clean based, single rectal ulcer- suspect solitary ulcer due to constipation. Biopsies taken to rule out infectious    Recommendation/Plan:    1) Await pathology  2) Repeat colonoscopy in 3 years or sooner ( if significant dysplasia)   3) Aggressive management of constipation - start daily fiber and miralax  4) GI clinic follow up    Caileigh Canche Alli-Akintade, MD  Clinical Fellow, PGY6  Division of Gastroenterology  [facility]    I was physically present during the entire procedure(s) indicated herein.    Electronically Signed By:   AmRoss MarcusAttending

## 2014-01-04 NOTE — Progress Notes (Addendum)
Gastroenterology/hepatology Pre-procedure History & Physical   IMMEDIATE PRE-SEDATION ASSESSMENT                    Referring provider: [name], MD     Procedure: Colonoscopy     HPI: Lynett Brasil is a 68yrold female with h/o fibromyalgia, DM, GERD, abnormal LFts, here for screening colonoscopy. No prior colonoscopy. She reports having stool tests in the past that have shown no blood    ROS:10 point ROS was obtained and is negative except as mentioned in the HPI.    I did review all available medical, surgical, personal/social history.     Physical exam:   General Appearance: healthy, alert, no distress, pleasant affect, cooperative.  Eyes:  conjunctivae and corneas clear. sclerae normal.  Mouth: normal.  Heart:  normal rate and regular rhythm  Lungs: clear to auscultation.  Abdomen: BS normal.  Abdomen soft, non-tender.       Labs/imaging:   [facility]: see computer database.   Performed outside of [place] (reviewed if applicable): N/A    Impression/ plan:   colonoscopy     Per pt ok to discuss bx/procedure results with family and/or leave message on voicemail.    Pre-Sedation/Anesthesia Assessment:    Patient is a candidate for moderate sedation.   Patient is ASA status: 3 - Moderate to severe systemic disease that limits activity but not incapacitating    Airway Assessment:    Mallampati class 1  ROM normal      The procedure, risks, benefits, and alternatives were explained.  All patient questions were answered.  The informed consent was signed, and will be scanned into the computer database at a later date.     Patient barriers to learning: none     Patient/family understanding: verbalizes       Neviah Braud Alli- ALieutenant Diego MD  Clinical Fellow, PGY-6  Department of Internal Medicine  Division of Gastroenterology & Hepatology      ARoss Marcus MD

## 2014-01-07 ENCOUNTER — Encounter: Payer: Self-pay | Admitting: Gastroenterology

## 2014-01-09 ENCOUNTER — Encounter: Payer: Self-pay | Admitting: Family Medicine

## 2014-01-10 ENCOUNTER — Encounter: Payer: Self-pay | Admitting: Family Medicine

## 2014-01-10 MED ORDER — ATORVASTATIN 10 MG TABLET: 10.0000 mg | ORAL_TABLET | Freq: Every day | ORAL | Status: DC

## 2014-01-10 NOTE — Telephone Encounter (Signed)
From: [name]  To: [name], MD  Sent: 01/09/2014 8:43 PM PDT  Subject: Non-urgent Medical Advice Question    Hello I received a call from [facility] that my lipitor needs to be filled. I am asking if you would change it slightly. Currently it is for 20 mg 1/2 tab qd. It comes in 10 mg tabs and that is the change I would like. Would you please order 10 mg tabs, 1 qd x 90 days. My insurance prefers long term meds to be ordered 90 days at a time Thank you so much. [name]

## 2014-01-11 LAB — SURGICAL PATHOLOGY

## 2014-01-29 ENCOUNTER — Other Ambulatory Visit: Payer: Self-pay

## 2014-02-02 ENCOUNTER — Ambulatory Visit: Payer: Medicare Other | Attending: Family Medicine

## 2014-02-02 DIAGNOSIS — Z7189 Other specified counseling: Secondary | ICD-10-CM

## 2014-02-04 NOTE — Progress Notes (Signed)
Dining with Diabetes   Date of Service:  02/02/2014            Health Management and Education:  Dining with Diabetes  120 minutes of Group Education    Paula Daugherty is a person with  uncontrolled Type 2 diabetes who attended and participated with support person to manage her diabetes and associated risks through nutrition and lifestyle intervention.    Topics covered:    1. Nutrition (carbohydrates, proteins, fats)  2. Carbohydrate counting   3. Food label reading  4. Meal timing  5. Choosing foods to improve heart health  6. Portion control  7. Strategic Meal planning    Handouts provided:   Dining with Diabetes nutrition packet   Calorie [employer] book    Barriers to Learning: None.  Patient/Family Understanding: verbalizes.    Contact information given/provided.    Hoyle Barr, RD   Health Management and Education

## 2014-02-06 ENCOUNTER — Ambulatory Visit: Payer: Medicare Other

## 2014-02-07 ENCOUNTER — Ambulatory Visit: Payer: Self-pay | Admitting: Adult Health

## 2014-02-14 ENCOUNTER — Ambulatory Visit: Payer: Medicare Other | Admitting: "Endocrinology

## 2014-02-14 VITALS — BP 138/80 | HR 88 | Temp 98.1°F | Ht 62.0 in | Wt 181.2 lb

## 2014-02-14 DIAGNOSIS — E1165 Type 2 diabetes mellitus with hyperglycemia: Secondary | ICD-10-CM

## 2014-02-14 DIAGNOSIS — R945 Abnormal results of liver function studies: Secondary | ICD-10-CM

## 2014-02-14 DIAGNOSIS — Z23 Encounter for immunization: Secondary | ICD-10-CM

## 2014-02-14 DIAGNOSIS — R7989 Other specified abnormal findings of blood chemistry: Secondary | ICD-10-CM

## 2014-02-14 MED ORDER — BLOOD SUGAR DIAGNOSTIC STRIPS: ORAL_STRIP | Status: DC

## 2014-02-14 NOTE — Progress Notes (Signed)
ENDOCRINOLOGY CONSULT      Date of Service: 02/14/2014     Name of Requesting Physician: [name], MD         REASON FOR CONSULTATION:  Diabetes mellitus type 2    HPI: Paula Daugherty is a 68yrold female who has a diagnosis of type 2 diabetes mellitus who presetns to Endocrinology to discuss thsi further.   She was initially diagnosed with diabetes about 15 years ago on routine labs. She has a strong family history of diabetes so she wasn't surprised at this. She was started on Metformin about 9 years ago and then she was started on insulin 2 years ago.  She was up to 64 units of Lantus at night. She was taken off insulin a few months ago due to concerns about injections. Since this time, she has been monitoring her blood glucose and she states that on a routine basis, her blood glucose is >130 most of the time. She states that she is not very compliant with a diabetic diet and believes that this is a major contributor to why her glycemic control is not at goal.     She also has a history of an elevated alk phos level. A GGT level was checked and was found to be elevated. A bone specific alkaline phosphatase was also found to be elevated. PTH level was normal.     ROS:  Current regimen:    Glipizide XL 10 mg daily   Metformin 1000 mg BID  Hypoglycemia: Yes, 1x/ month - usually more in the afternoon times   Treatment: Juice or soft drink   Awareness: Yes <80  Hyperglycemia: Yes, pretty regularly    Meter brought today: No  Checks blood glucose: 1-2x/day (usually in the morning, fasting)   Home blood glucose log brought to clinic today:  Home blood glucose numbers per pt's log:   Blood Glucose:     Pre-breakfast: 100s to 160s    Diet and exercise:   Breakfast: Frozen waffles or cereal with bananas, bacon sometimes, scrambled eggs or yogurt, coffee with milk   Lunch: Will sometimes skip or eat out - [employer]burger (cheeseburger and fries), mongolian bbq, togos, Sprite (regular) or iced tea or water   Dinner:  Popcorn, chili dogs, meatloaf/asparagus/potates   Snacks: Snacks throughout the day - ice cream - usually snacks more in the evenings   Exercise: Very minimal  Last eye exam: 04/2013, f/u 1 year  Foot exam: Will do today     All other systems were reviewed and are negatative except for pertinent positive and negative responses as documented in HPI.     I did review patient's past medical and family/social history, no changes noted.   PMH:  Past medical history was reviewed from problem list.   Patient Active Problem List   Diagnosis    DM2 (diabetes mellitus, type 2)    Depression with anxiety    Stress at home    Leg cramps-right calf    Right ear pain    Fibromyalgia    HTN (hypertension)    GERD (gastroesophageal reflux disease)    Hyperlipidemia with target LDL less than 70    Vitamin D insufficiency    Abnormal LFTs    Renal cyst ( 6.4 cm cyst left kidney)    Cough    Type 2 diabetes mellitus without complication     Family History   Problem Relation Age of Onset    Diabetes Father  Heart Mother      MI at 102s     Non-contributory Brother     Non-contributory Brother    Father - heart disease  Paternal grandmother - DMT1  Paternal grandfather - heart disease  Daughter - paraplegic     Past Surgical History   Procedure Laterality Date    Pr ligate fallopian tube       Tubal ligation    Pr total abdom hysterectomy  1980s     partial; no BSO    Pr knee scope,diagnostic  1980s     Knee arthroscopy    Pr repair tympanic membrane       Tympanoplasty    Colonoscopy  01/04/14     polyp--path pending; hemorrhoids; tics; repeat x 3 yrs     Social History     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[employer]       Social History Main Topics    Smoking status: Former Smoker -- 0.10 packs/day for 20 years    Smokeless tobacco: Never Used    Alcohol Use: Yes      Comment: rare shares a beer or glass wine     Drug Use: No    Sexual Activity:     Partners: Male     Immunization History    Administered Date(s) Administered    None   Deferred Date(s) Deferred    (Pneumovax) Pneumococcal Vaccine, Polysaccharide 12/13/2013    Tdap (Adacel ) 01/03/2014     Allergies   Allergen Reactions    Contrast Dye [Radiopaque Agent] Hives    Hydrochlorothiazide Other-Reaction in Comments     Patient reports    Januvia [Sitagliptin] Other-Reaction in Comments     Patient reports    Lyrica [Pregabalin] Other-Reaction in Comments     Patient reports    Omnipaque [Iohexol] Other-Reaction in Comments     Patient reports    Prozac [Fluoxetine Hcl] Other-Reaction in Comments     Patient reports    Sulfa (Sulfonamide Antibiotics) Rash    Topiramate Other-Reaction in Comments     Patient reports     Medications:  Medication reconciliation was performed today.   Current Outpatient Prescriptions on File Prior to Visit   Medication Sig Dispense Refill    Amlodipine (NORVASC) 10 mg Tablet TAKE 1 TABLET BY MOUTH EVERY DAY 90 tablet 1    Aspirin 81 mg DR Tablet EC Tablet Take 1 tablet by mouth every day. 30 tablet 5    Atorvastatin (LIPITOR) 10 mg Tablet Take 1 tablet by mouth every day at bedtime. 90 tablet 3    BuPROPion (WELLBUTRIN XL) 150 mg XL tablet Take 1 tablet by mouth every morning. 30 tablet 3    Cholecalciferol, Vitamin D3, (VITAMIN D-3) 2,000 unit Tablet Take 1 tablet by mouth every day.      DiphenhydrAMINE ([facility] ALLERGY) 25 mg Tablet Tablet Take 1-2 tablets by mouth every day at bedtime if needed. Indications: Allergic Reactions      Duloxetine (CYMBALTA) 30 mg Delayed Release Capsule Take 3 capsules by mouth every day. 270 capsule 3    Fexofenadine (ALLEGRA) 180 mg tablet Take 1 tablet by mouth every day. 30 tablet 11    Fluticasone (FLONASE) 50 mcg/actuation nasal spray Instill 2 sprays into EACH nostril every day. 16 g 1    GlipiZIDE (GLUCOTROL XL) 10 mg SR Tablet Take 1 tablet by mouth every morning with a meal. (diabetes) 30 tablet 5  Hydrocodone 5 mg/Acetaminophen 500 mg (VICODIN) 5-500  mg Tablet Take 1 tablet by mouth two times daily if needed for pain (severe).      Insulin Glargine (LANTUS SOLOSTAR) 100 unit/mL (3 mL) Pen INJECT 64 UNITS SUBCUTANEOUSLY EVERY DAY AT BEDTIME. INJECT SUBQ AT BEDTIME. (DIABETES) 60 syringe 3    Metformin (GLUCOPHAGE) 1,000 mg tablet Take 1 tablet by mouth 2 times daily with meals. (diabetes) 60 tablet 5    Omeprazole (PRILOSEC) 20 mg Delayed Release Capsule Take 1 capsule by mouth every morning. (acid) 30 capsule 1    Oxybutynin (DITROPAN) 5 mg Tablet Take 5 mg by mouth 3 times daily.       No current facility-administered medications on file prior to visit.     Magnesium /calcium supplement    Allergies:    Contrast Dye [Radiopaque Agent]    Hives  Hydrochlorothiazide    Other-Reaction in Comments    Comment:Patient reports  Januvia [Sitagliptin]    Other-Reaction in Comments    Comment:Patient reports  Lyrica [Pregabalin]    Other-Reaction in Comments    Comment:Patient reports  Omnipaque [Iohexol]    Other-Reaction in Comments    Comment:Patient reports  Prozac [Fluoxetine Hcl]    Other-Reaction in Comments    Comment:Patient reports  Sulfa (Sulfonamide Antibiotics)    Rash  Topiramate    Other-Reaction in Comments    Comment:Patient reports    VITAL SIGNS:  BP 138/80 mmHg  Pulse 88  Temp(Src) 36.7 C (98.1 F) (Tympanic)  Ht 1.575 m (5' 2" )  Wt 82.192 kg (181 lb 3.2 oz)  BMI 33.13 kg/m2  SpO2 95%  LMP 05/06/1983  Body mass index is 33.13 kg/(m^2).    PHYSICAL EXAM:  General Appearance: healthy, alert, no distress, pleasant affect, cooperative.  Eyes:  conjunctivae and corneas clear. PERRL, EOM's intact. sclerae normal.  Mouth: normal.  Neck:  Neck supple. No adenopathy, thyroid symmetric, normal size.  Heart:  normal rate and regular rhythm, no murmurs, clicks, or gallops.  Lungs: clear to auscultation.  Abdomen: BS normal.  Abdomen soft, non-tender.  No masses or organomegaly.  Extremities:  no cyanosis, clubbing, or edema.  Foot Exam: normal DP and  PT pulses, no trophic changes or ulcerative lesions, normal sensory exam and normal monofilament exam.  Skin:  Skin color, texture, turgor normal. No rashes or lesions.  Neuro: Gait normal. Sensation and strength grossly normal. No tremor appreciated.   Mental Status: Appearance/Cooperation: in no apparent distress and well developed and well nourished  Eye Contact: normal  Speech: normal volume, rate, and pitch      LAB TESTS/STUDIES:   I personally reviewed the following laboratory studies.     07/11/2013 09:57 08/19/2013 12:03 12/08/2013 10:30 12/14/2013 14:55   SODIUM 139  142    POTASSIUM 4.0  4.0    CHLORIDE 99  100    CARBON DIOXIDE TOTAL 28  29    UREA NITROGEN, BLOOD (BUN) 16  15    CREATININE BLOOD 0.88  0.89    E-GFR, AFRICAN AMERICAN >60  >60    E-GFR, NON-AFRICAN AMERICAN >60  >60    GLUCOSE 144 (H)  157 (H)    CALCIUM 9.7  9.9    PROTEIN 8.4 (H) 7.6 8.0    ALBUMIN 4.1 3.9 4.0    ALKALINE PHOSPHATASE (ALP) 128 (H) 136 (H) 125 (H)    ASPARTATE TRANSAMINASE (AST) 56 (H) 68 (H) 64 (H)    BILIRUBIN TOTAL 0.9 0.8 0.8  BILIRUBIN DIRECT  0.1     ALANINE TRANSFERASE (ALT) 64 (H) 64 (H) 60 (H)    FASTING YES YES     CHOLESTEROL 262 (H) 177     TRIGLYCERIDE 183 (H) 122     LDL CHOLESTEROL CALCULATION 163 (H) 93     HDL CHOLESTEROL 62 60     NON-HDL CHOLESTEROL 200 (H) 117     TOTAL CHOLESTEROL:HDL RATIO 4.2 (H) 3.0     CREATINE KINASE    49   GAMMA GLUTAMYL TRANSFERASE(GGT   96 (H)    THYROID STIMULATING HORMONE 0.61  1.58    AFP CANCER MARKER   5.2    HGB A1C 7.0 (H)  7.0 (H)    HGB A1C,GLUCOSE EST AVG 154  154    PARATHYROID HORMONE INTACT    65   VITAMIN D, 25 HYDROXY 20.0 (L)  30.2      Result Date - 12/10/2013      Component Results     Component Value Ref Range & Units Status    ALK-PHOSPHATASE 138 (H) 40-120 U/L Final    ALK-PHOSPHATASE LIVER CALC 76 0-94 U/L Final    ALK-PHOSPHATASE BONE CALC 62 (H) 0-55 U/L Final    ALK-PHOSPHATASE OTHER CALC 0 () U/L Final      12/14/2013 14:55   WHITE BLOOD CELL COUNT 10.9    HEMOGLOBIN 13.8   HEMATOCRIT 42.6   MCV 85.8   RDW 14.0   PLATELET COUNT 284      12/08/2013 10:30   CREATININE SPOT URINE 225.81   MICROALBUMIN URINE 13.4   MICROALBUMIN/CREATININE RATIO 59 (H)       Impression: This is a 68yrold female with Diabetes Mellitus Type II, uncontrolled who presents to Endocrinology to discuss this further. Overall, the patient's glycemic control is near goal as evidenced by her A1c of 7% in 12/2013. She is mostly checking her blood glucose in the morning and this value is near goal most of the time per her report. We discussed monitoring her blood glucose at other times during the day to gain a better perspective on her glycemic control after eating. We also discussed diabetic diet and the patient was encouraged to watch her carbohydrate intake and increase exercise to help with her diabetes management overall.   She also has a history of an elevated alk phos level. GGT was elevated which suggests a liver source, however when isoenzymes of alk phos were checked the bone specific component was elevated. I have asked the patient to repeat this test at this time.       DIABETES HISTORY  (-) h/o retinopathy.    (-) h/o microalbuminuria/nephropathy.  (-) h/o neuropathy.  (-) h/o Autonomic dysfunction  (-) h/o Gastroparesis  (-) h/o CAD  (-) h/o PVD Last eye exam: 04/2013, f/u 1 year  Last urine microalbumin: 12/2013, 59  Flu vaccination: Will do today  (+) ASA  (+) Statin  (-) ACE/ARB         Recommendations:  (E11.65) Type 2 diabetes mellitus with hyperglycemia  (primary encounter diagnosis)  - No changes to patient's medications today  - Patient encouraged to continue checking blood glucose regularly and increase monitoring to at least BID  - Blood pressure near goal, continue amlodipine, will discuss addition of ACE-I  - Micoralbumin:creatinine level slightly elevated, will repeat now, if continues to be high, will add ACE-I  - Influenza vaccine today  - Last LDL at goal, continue  statin, next  fasting lipid panel due 08/2013  - Up to date on screening eye exam, next due 04/2014  - Discussed diabetic diet today - patient was encouraged to increase exercise and watch diet choices    (R79.89) Abnormal LFTs  - Check alk phos isoenzyme    Approximately 45 minutes were spent with patient, greater than 50% of which was spent counseling the patient on diabetes management and coordination of care.     Follow up in 3 months    If you have any questions, please do not hesitate to contact me at [phone].  Thank you for allowing me to participate in the care of this patient.    EDUCATION:  I educated/instructed the patient or caregiver regarding all aspects of the above stated plan of care.  The patient or caregiver indicated understanding.      Mio interpreter was not used.    Report electronically signed by:  Sunday Spillers, M.D.  Clinical Assistant Professor  Department of Endocrinology  Pager # [phone]

## 2014-02-14 NOTE — Nursing Note (Signed)
Vital signs taken, allergies verified, screened for pain, and pharmacy verified.  Cory O'Dell,MA

## 2014-02-20 ENCOUNTER — Encounter: Payer: Self-pay | Admitting: Family Medicine

## 2014-02-20 ENCOUNTER — Other Ambulatory Visit: Payer: Self-pay | Admitting: Family Medicine

## 2014-02-20 NOTE — Telephone Encounter (Signed)
From: [name]  To: [name], MD  Sent: 02/20/2014 3:57 PM PDT  Subject: Non-urgent Medical Advice Question    HI Dr. I wanted to tell you that I had been ordered Welbutrin 150 but ran out. I had 100 MG so started taking 2 tabs each am. Now I am out of those also. But they don't seem to be working. Still depressed and feel agitated a good part of the time. I see you Nov 10th but can try to come sooner if you think I should. Please let me know what to do. Thanks

## 2014-02-20 NOTE — Telephone Encounter (Signed)
Last office visit: 01-03-14  Please advise  Paula Daugherty, [employer]

## 2014-02-21 ENCOUNTER — Ambulatory Visit: Payer: Medicare Other | Attending: "Endocrinology

## 2014-02-21 DIAGNOSIS — E1165 Type 2 diabetes mellitus with hyperglycemia: Secondary | ICD-10-CM | POA: Insufficient documentation

## 2014-02-21 DIAGNOSIS — R945 Abnormal results of liver function studies: Secondary | ICD-10-CM

## 2014-02-21 DIAGNOSIS — R7989 Other specified abnormal findings of blood chemistry: Secondary | ICD-10-CM

## 2014-02-21 DIAGNOSIS — R5382 Chronic fatigue, unspecified: Secondary | ICD-10-CM | POA: Insufficient documentation

## 2014-02-21 LAB — MICROALBUMIN
Microalbumin Urine: 176.4 mg/dl
Microalbumin/Creatinine Ratio: 333 mg/g CR — ABNORMAL HIGH (ref <30)

## 2014-02-21 LAB — CREATININE SPOT URINE: Creatinine Spot Urine: 530.26 mg/dL

## 2014-02-23 ENCOUNTER — Encounter: Payer: Self-pay | Admitting: "Endocrinology

## 2014-02-23 ENCOUNTER — Other Ambulatory Visit: Payer: Self-pay | Admitting: "Endocrinology

## 2014-02-23 DIAGNOSIS — E1129 Type 2 diabetes mellitus with other diabetic kidney complication: Secondary | ICD-10-CM

## 2014-02-23 MED ORDER — LISINOPRIL 10 MG TABLET
10.0000 mg | ORAL_TABLET | Freq: Every day | ORAL | Status: DC
Start: 2014-02-23 — End: 2014-03-14

## 2014-02-24 LAB — ALKALINE PHOSPHATASE ISOENZYME SENDOUT
ALK-PHOSPHATASE BONE CALC: 54 U/L (ref 0–55)
ALK-PHOSPHATASE LIVER CALC: 91 U/L (ref 0–94)
ALK-PHOSPHATASE OTHER CALC: 0 U/L
ALK-PHOSPHATASE: 145 U/L — ABNORMAL HIGH (ref 40–120)

## 2014-02-28 LAB — HELICOBACTER PYLORI ANTIGEN: Helicobacter Pylori Antigen: NEGATIVE

## 2014-03-01 ENCOUNTER — Ambulatory Visit: Payer: Medicare Other | Attending: Family Medicine | Admitting: Family Medicine

## 2014-03-01 ENCOUNTER — Ambulatory Visit (INDEPENDENT_AMBULATORY_CARE_PROVIDER_SITE_OTHER): Payer: Medicare Other

## 2014-03-01 ENCOUNTER — Encounter: Payer: Self-pay | Admitting: Family Medicine

## 2014-03-01 VITALS — BP 149/80 | HR 69 | Temp 98.2°F | Wt 186.3 lb

## 2014-03-01 DIAGNOSIS — R945 Abnormal results of liver function studies: Secondary | ICD-10-CM

## 2014-03-01 DIAGNOSIS — I10 Essential (primary) hypertension: Secondary | ICD-10-CM

## 2014-03-01 DIAGNOSIS — R7989 Other specified abnormal findings of blood chemistry: Secondary | ICD-10-CM

## 2014-03-01 DIAGNOSIS — F439 Reaction to severe stress, unspecified: Secondary | ICD-10-CM

## 2014-03-01 DIAGNOSIS — E1129 Type 2 diabetes mellitus with other diabetic kidney complication: Secondary | ICD-10-CM

## 2014-03-01 DIAGNOSIS — Z638 Other specified problems related to primary support group: Secondary | ICD-10-CM | POA: Insufficient documentation

## 2014-03-01 LAB — COMPREHENSIVE METABOLIC PANEL
Alanine Transferase (ALT): 81 U/L — ABNORMAL HIGH (ref 5–54)
Albumin: 4 g/dL (ref 3.2–4.6)
Alkaline Phosphatase (ALP): 137 U/L — ABNORMAL HIGH (ref 35–115)
Aspartate Transaminase (AST): 80 U/L — ABNORMAL HIGH (ref 15–43)
Bilirubin Total: 0.9 mg/dL (ref 0.3–1.3)
Calcium: 9.8 mg/dL (ref 8.6–10.5)
Carbon Dioxide Total: 28 mEq/L (ref 24–32)
Chloride: 102 mEq/L (ref 95–110)
Creatinine Blood: 0.78 mg/dL (ref 0.44–1.27)
E-GFR, African American: >60 SEE NOTE (ref >60)
E-GFR, Non-African American: >60 SEE NOTE (ref >60)
Glucose: 146 mg/dL — ABNORMAL HIGH (ref 70–99)
Potassium: 4 mEq/L (ref 3.3–5.0)
Protein: 8.4 g/dL — ABNORMAL HIGH (ref 6.3–8.3)
Sodium: 141 mEq/L (ref 135–145)
Urea Nitrogen, Blood (BUN): 13 mg/dL (ref 8–22)

## 2014-03-01 MED ORDER — ESCITALOPRAM 5 MG TABLET
5.0000 mg | ORAL_TABLET | Freq: Every day | ORAL | Status: DC
Start: 2014-03-01 — End: 2014-04-10

## 2014-03-01 MED ORDER — AMLODIPINE 10 MG TABLET
1.0000 | ORAL_TABLET | Freq: Every day | ORAL | Status: DC
Start: 2014-03-01 — End: 2014-10-12

## 2014-03-01 NOTE — Nursing Note (Signed)
Vital signs taken, allergies and pharmacy verified. Screened for pain.

## 2014-03-01 NOTE — Patient Instructions (Signed)
A laboratory test has been ordered for you.

## 2014-03-01 NOTE — Progress Notes (Signed)
Paula Daugherty is a 52yrhere for follow-up with PCP  Chief Complaint   Patient presents with    Mood Problems     1. DMII: off Lantus; resports FSBS fasting of 150; no dietary changes per patient   2. HTN: She reports .taking medications as instructed, no medication side effects noted, no TIA's, no chest pain on exertion, no dyspnea on exertion, no swelling of ankles; blood pressure reads: none; started in Lisinopril x 2 weeks due to microproteinuria; labs needed   3. Elevated ALk Phos: to recheck; to see if hepatology referral needed       ROS:   .Constitutional: no fever/chills.  Patient reports history of fibromyalgia and support grp along with exercise encouraged; now off Cymbalta after tapering off; mood reported as poor     Patient Active Problem List   Diagnosis    DM2 (diabetes mellitus, type 2)    Depression with anxiety    Stress at home    Leg cramps-right calf    Right ear pain    Fibromyalgia    HTN (hypertension)    GERD (gastroesophageal reflux disease)    Hyperlipidemia with target LDL less than 70    Vitamin D insufficiency    Abnormal LFTs    Renal cyst ( 6.4 cm cyst left kidney)    Cough    Type 2 diabetes mellitus without complication         Current Outpatient Prescriptions on File Prior to Visit   Medication Sig Dispense Refill    Aspirin 81 mg DR Tablet EC Tablet Take 1 tablet by mouth every day. 30 tablet 5    Atorvastatin (LIPITOR) 10 mg Tablet Take 1 tablet by mouth every day at bedtime. 90 tablet 3    Blood Sugar Diagnostic (ONETOUCH ULTRA TEST) Strips Use for testing blood glucose 3 times per day 300 strip 3    BuPROPion (WELLBUTRIN XL) 150 mg XL tablet Take 1 tablet by mouth every morning. 30 tablet 3    Cholecalciferol, Vitamin D3, (VITAMIN D-3) 2,000 unit Tablet Take 1 tablet by mouth every day.      DiphenhydrAMINE ([facility] ALLERGY) 25 mg Tablet Tablet Take 1-2 tablets by mouth every day at bedtime if needed. Indications: Allergic Reactions      Fexofenadine (ALLEGRA)  180 mg tablet Take 1 tablet by mouth every day. 30 tablet 11    Fluticasone (FLONASE) 50 mcg/actuation nasal spray Instill 2 sprays into EACH nostril every day. 16 g 1    GlipiZIDE (GLUCOTROL XL) 10 mg SR Tablet Take 1 tablet by mouth every morning with a meal. (diabetes) 30 tablet 5    Hydrocodone 5 mg/Acetaminophen 500 mg (VICODIN) 5-500 mg Tablet Take 1 tablet by mouth two times daily if needed for pain (severe).      Lisinopril (PRINIVIL, ZESTRIL) 10 mg Tablet Take 1 tablet by mouth every day. 30 tablet 3    Metformin (GLUCOPHAGE) 1,000 mg tablet Take 1 tablet by mouth 2 times daily with meals. (diabetes) 60 tablet 5    Omeprazole (PRILOSEC) 20 mg Delayed Release Capsule TAKE ONE CAPSULE BY MOUTH EVERY DAY 90 capsule 0    Oxybutynin (DITROPAN) 5 mg Tablet Take 5 mg by mouth 3 times daily.       No current facility-administered medications on file prior to visit.     Allergies:   Allergies   Allergen Reactions    Contrast Dye [Radiopaque Agent] Hives    Hydrochlorothiazide Other-Reaction in Comments  Patient reports    Januvia [Sitagliptin] Other-Reaction in Comments     Patient reports    Lyrica [Pregabalin] Other-Reaction in Comments     Patient reports    Omnipaque [Iohexol] Other-Reaction in Comments     Patient reports    Prozac [Fluoxetine Hcl] Other-Reaction in Comments     Patient reports    Sulfa (Sulfonamide Antibiotics) Rash    Topiramate Other-Reaction in Comments     Patient reports       History     Social History    Marital Status: MARRIED     Spouse Name: N/A     Number of Children: 1    Years of Education: N/A     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[employer]       Social History Main Topics    Smoking status: Former Smoker -- 0.10 packs/day for 20 years    Smokeless tobacco: Never Used    Alcohol Use: Yes      Comment: rare shares a beer or glass wine     Drug Use: No    Sexual Activity:     Partners: Male     Other Topics Concern    Not on file     Social  History Narrative     Family History   Problem Relation Age of Onset    Diabetes Father     Heart Mother      MI at 82s     Non-contributory Brother     Non-contributory Brother        PE:  BP 149/80 mmHg  Pulse 69  Temp(Src) 36.8 C (98.2 F) (Temporal)  Wt 84.505 kg (186 lb 4.8 oz)  LMP 05/06/1983  .General Appearance: healthy, alert, no distress, pleasant affect, cooperative.  Heart:  normal rate and regular rhythm, no murmurs, clicks, or gallops.  Lungs: clear to auscultation.  Extremities:  no cyanosis, clubbing, or edema.  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  (primary encounter diagnosis)  Comment: off Lantus; seen by DR [name]--we appreciate the great care; started on ACE inh x 2 weeks--to check labs   Plan: COMPREHENSIVE METABOLIC PANEL    (J09) Essential hypertension  Comment: goal is <140/90; recent addition of ACE inh   Plan: COMPREHENSIVE METABOLIC PANEL    (T26.7) Stress at home  Comment: along with history of depression and anxiety   Plan: Escitalopram (LEXAPRO) 5 mg Tablet        To return to clinic in 2 weeks plus prn     (R79.89) Abnormal LFTs  Comment: for alk phos   Plan: COMPREHENSIVE METABOLIC PANEL        To recheck labs       Total encounter time including history, physical examination, and coordination of care was approximately 20 minutes of which more than 50% was spent counseling regarding assessment/diagnosis and treatment plan regarding mood and DM--patient verbalized understanding. No guarantees were made regarding her medical care or treatment outcome. Barriers to Learning: none.  Patient verbalizes understanding of teaching and instructions.    Electronically signed by:    [name], MD  [place], [place] [employer]  Associate physician [facility], [place]   [phone]

## 2014-03-07 ENCOUNTER — Ambulatory Visit: Payer: Self-pay | Admitting: Family Medicine

## 2014-03-10 ENCOUNTER — Ambulatory Visit: Payer: Self-pay

## 2014-03-10 ENCOUNTER — Ambulatory Visit: Payer: Medicare Other

## 2014-03-10 DIAGNOSIS — Z7189 Other specified counseling: Secondary | ICD-10-CM

## 2014-03-10 NOTE — Progress Notes (Signed)
Recharge Workshop  Date of Service:  03/10/2014         Health Management and Education:  [employer]  120 minutes of Group Education    Paula Daugherty is a person with  uncontrolled Type 2 diabetes who attended and participated with support person.    Patient shared challenges/successes with their diabetes self-care:  What is DM?, High/Low Glucose, CHO counting /Plate Method, Foot Care, Complications, Stress and Exercise    Topics covered:  1. Meal planning update with focus on carbohydrate counting and label reading as will as fat and fiber instruction  2. Exercise benefits for blood sugar control  3. Medication update and review  4. Action plan update   5. Community Resources  6. Stress Management/Motivation    Handouts Provided:    Handouts on above as well as [place] chronic disease website: www.chronicdisease.LocalChronicle.no for ongoing diabetes information   Personalized "Diabetes Summary"     Patient history reviewed.    Patient expressed understanding and plans to follow up with primary care provider    Barriers to Learning: None.  Patient/Family Understanding: verbalizes.  Contact information given/provided.    Hoyle Barr, RD  Health Management and Education

## 2014-03-14 ENCOUNTER — Ambulatory Visit: Payer: Medicare Other | Admitting: Adult Health

## 2014-03-14 ENCOUNTER — Encounter: Payer: Self-pay | Admitting: Family Medicine

## 2014-03-14 ENCOUNTER — Encounter: Payer: Self-pay | Admitting: Adult Health

## 2014-03-14 ENCOUNTER — Ambulatory Visit (INDEPENDENT_AMBULATORY_CARE_PROVIDER_SITE_OTHER): Payer: Medicare Other | Admitting: Family Medicine

## 2014-03-14 VITALS — BP 126/66 | HR 72 | Temp 97.5°F | Wt 186.2 lb

## 2014-03-14 VITALS — BP 120/66 | HR 84 | Temp 96.8°F | Wt 186.9 lb

## 2014-03-14 DIAGNOSIS — I10 Essential (primary) hypertension: Secondary | ICD-10-CM

## 2014-03-14 DIAGNOSIS — F418 Other specified anxiety disorders: Secondary | ICD-10-CM

## 2014-03-14 DIAGNOSIS — Z86010 Personal history of colonic polyps: Secondary | ICD-10-CM

## 2014-03-14 DIAGNOSIS — K5900 Constipation, unspecified: Secondary | ICD-10-CM

## 2014-03-14 DIAGNOSIS — K5909 Other constipation: Secondary | ICD-10-CM

## 2014-03-14 DIAGNOSIS — M797 Fibromyalgia: Secondary | ICD-10-CM

## 2014-03-14 DIAGNOSIS — E1129 Type 2 diabetes mellitus with other diabetic kidney complication: Secondary | ICD-10-CM

## 2014-03-14 DIAGNOSIS — R110 Nausea: Secondary | ICD-10-CM

## 2014-03-14 MED ORDER — LOSARTAN 25 MG TABLET
25.0000 mg | ORAL_TABLET | Freq: Every day | ORAL | Status: DC
Start: 2014-03-14 — End: 2014-06-07

## 2014-03-14 NOTE — Nursing Note (Signed)
Vital signs taken, verfied allergies, pharmacy and screened for pain.  Paula Daugherty L Emma Birchler, MAII

## 2014-03-14 NOTE — Patient Instructions (Signed)
A laboratory test has been ordered for you.

## 2014-03-14 NOTE — Progress Notes (Signed)
DOS: 03/14/2014    Paula Daugherty  MRN: [id number]  DOB: [birth year]    GI Consultation requested by:   [name], MD  [address]  [place] [address]    Subjective: Paula Daugherty is a 68yrold female who presents on referral from pcp for GERD.    Patient referred to GI to discuss GERD; however, pt says she doesn't know why she is here. She doesn't have reflux symptoms; however, she has suffered from chronic intermittent nausea and constipation.    Has nausea ~3 days a week; can occur early morning or late in day; lasts a couple of hours; has had symptom for a couple of years; goes away with time; may drink herbal tea to help with symptom.   Generally no vomiting; however, in August 2015, she had a 6 week bout of intermittent vomiting (not daily). Resolved. No vomiting episodes since then. Has never had this before.    Abd u/s 12/2013 - Fatty liver. Gallbladder is present. No gallstones are seen. There is no evidence of acute cholecystitis. CBD is nondilated measuring 3 mm.    Occasional lower abd discomfort, across abdomen, below umbilicus, "it just hurts", goes away with time, occurs once/week  No heartburn symptoms. Takes omeprazole 20 mg daily in the morning on empty stomach.   Eats more frequently and smaller amounts  Helicobacter pylori antigen negative  Takes 81 mg ASA daily    No loss of appetite, no early satiety, no dysphagia, no unintentional wt loss  No family hx colon/gastric cancer.  No prior EGD    BM ~once/week; + straining; stools pebble-like  +bloating and chronic constipation (for years)  No diarrhea.  Doesn't take a laxative routinely. Has taken correctol w/o significant benefit  Has miralax at home but not taking it.  Colonoscopy 01/2014 - polyp--TA and erosion; hemorrhoids; tics; repeat x 3 yrs    ROS:  A 14-point review of systems was done, and was negative, except as above    Problem List:   Patient Active Problem List   Diagnosis    DM2 (diabetes mellitus, type 2)     Depression with anxiety    Stress at home    Leg cramps-right calf    Right ear pain    Fibromyalgia    HTN (hypertension)    GERD (gastroesophageal reflux disease)    Hyperlipidemia with target LDL less than 70    Vitamin D insufficiency    Abnormal LFTs    Renal cyst ( 6.4 cm cyst left kidney)    Cough    Type 2 diabetes mellitus without complication       Past Medical History:  I reviewed the patient's medical history in the EMR.  No past medical history on file.    Past Surgical History:   I reviewed the patient's surgical history in the EMR.  Past Surgical History   Procedure Laterality Date    Pr ligate fallopian tube       Tubal ligation    Pr total abdom hysterectomy  1980s     partial; no BSO    Pr knee scope,diagnostic  1980s     Knee arthroscopy    Pr repair tympanic membrane       Tympanoplasty    Colonoscopy  01/04/14     polyp--TA and erosion; hemorrhoids; tics; repeat x 3 yrs       Family History:   I reviewed the patient's family history in  the EMR.  Family History   Problem Relation Age of Onset    Diabetes Father     Heart Mother      MI at 73s     Non-contributory Brother     Non-contributory Brother        Social History:   I reviewed the patient's social history in the EMR.  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  Current Outpatient Prescriptions on File Prior to Visit    Medication Sig Dispense Refill    Amlodipine (NORVASC) 10 mg Tablet Take 1 tablet by mouth every day. 90 tablet 1    Aspirin 81 mg DR Tablet EC Tablet Take 1 tablet by mouth every day. 30 tablet 5    Atorvastatin (LIPITOR) 10 mg Tablet Take 1 tablet by mouth every day at bedtime. 90 tablet 3    Blood Sugar Diagnostic (ONETOUCH ULTRA TEST) Strips Use for testing blood glucose 3 times per day 300 strip 3    Cholecalciferol, Vitamin D3, (VITAMIN D-3) 2,000 unit Tablet Take 1 tablet by mouth every day.      Escitalopram (LEXAPRO) 5 mg Tablet Take 1 tablet by mouth every day. 30 tablet 1    Fexofenadine (ALLEGRA) 180 mg tablet Take 1 tablet by mouth every day. 30 tablet 11    Fluticasone (FLONASE) 50 mcg/actuation nasal spray Instill 2 sprays into EACH nostril every day. 16 g 1    GlipiZIDE (GLUCOTROL XL) 10 mg SR Tablet Take 1 tablet by mouth every morning with a meal. (diabetes) 30 tablet 5    Lisinopril (PRINIVIL, ZESTRIL) 10 mg Tablet Take 1 tablet by mouth every day. 30 tablet 3    Metformin (GLUCOPHAGE) 1,000 mg tablet Take 1 tablet by mouth 2 times daily with meals. (diabetes) 60 tablet 5    Omeprazole (PRILOSEC) 20 mg Delayed Release Capsule TAKE ONE CAPSULE BY MOUTH EVERY DAY 90 capsule 0    Oxybutynin (DITROPAN) 5 mg Tablet Take 5 mg by mouth 3 times daily.       No current facility-administered medications on file prior to visit.       Labs:  CBC:   Lab Results   Lab Name Value Date/Time    WBC 10.9 12/14/2013 02:55 PM    HGB 13.8 12/14/2013 02:55 PM    HCT 42.6 12/14/2013 02:55 PM    PLT 284 12/14/2013 02:55 PM       BCP:   Lab Results   Lab Name Value Date/Time    NA 141 03/01/2014 01:05 PM    K  4.0 03/01/2014 01:05 PM    CL 102 03/01/2014 01:05 PM    CO2 28 03/01/2014 01:05 PM    BUN 13 03/01/2014 01:05 PM    CR 0.78 03/01/2014 01:05 PM    GLU 146* 03/01/2014 01:05 PM     Physical Exam:  Filed Vitals:    03/14/14 1401   BP: 126/66   Pulse: 72   Temp: 36.4 C (97.5 F)   TempSrc: Tympanic    Weight: 84.454 kg (186 lb 3 oz)       General Appearance: healthy, alert, no distress, pleasant affect, cooperative, skin warm, dry, and pink.  Mental Status: Appearance/Cooperation: in no apparent distress, well developed and well nourished, non-toxic, in no respiratory distress and acyanotic, alert, oriented times 3 and well groomed and dressed  Heart:  normal rate and regular rhythm, no murmurs, clicks, or gallops.  Lungs: clear to auscultation.  Abdomen: BS normal.  Abdomen soft, non-tender.  No masses or organomegaly.      Impression/Recommendations:  Calyssa Zobrist is a 68yrold female who presents for:    Chronic constipation: cbc, bmp, tsh unremarkable. Trial of miralax. Adjust dose to goal of 1-2 bms/day.  If no improvement with miralax, consider trial of MOM 2 TBSP po qHS.    Chronic intermittent nausea (for years): ? R/t constipation. Will treat constipation for now and see if nausea improves.    Esophageal reflux: denies reflux symptoms. Takes omeprazole 20 mg daily.  Calcium, Mg, Vit D levels normal as of 12/2013. Adhere to antireflux measures.  Continue calcium supplementation.    Adenomatous colon polyp (01/2014): surveillance colonoscopy due 01/2017.    Patient barriers to learning: none    Patient/family understanding: verbalizes    RTC: 4 wks    Visit length: 30 minutes  > 50% time spent discussing nausea, constipation and mgmt of both, as well as coordinating care on this patient. All questions/concerns were addressed to the satisfaction of the patient and she verbalizes understanding and agrees with the plan of care.    DMa Hillock NP  PI# [id number] Division of Gastroenterology  [facility]

## 2014-03-14 NOTE — Patient Instructions (Addendum)
Discharge Instructions      Try Miralax 17 grams (1 capful) in 8 ounces of fluid once daily for the first 3 days initially:  - If the 17 gram daily dose produces a good bowel movement that is easy to pass, please continue this dose daily.  -If your stools are too soft on this dose, decrease to 1/2 capful (8.5 grams) in 8 ounces of fluid daily.  -If your stools are too hard or infrequent on once daily dosing, you can increase to 17 gram (1 capful) taken twice daily or take 34 grams (2 capfuls) in 12 - 16 ounces of water at one time.    Goal: 1 or 2 bowel movements a day.    If no improvement in constipation with miralax, consider taking milk of magnesia 2 TBSP daily at bedtime    Constipation  Constipation is defined as having a bowel movement fewer than three times per week. With constipation stools are usually hard, dry, small in size, and difficult to eliminate. Some people who are constipated find it painful to have a bowel movement and often experience straining, bloating, and the sensation of a full bowel.  Some people think they are constipated if they do not have a bowel movement every day. However, normal stool elimination may be three times a day or three times a week, depending on the person.   Constipation is a symptom, not a disease. Almost everyone experiences constipation at some point in their life, and a poor diet typically is the cause. Most constipation is temporary and not serious. Understanding its causes, prevention, and treatment will help most people find relief.  Constipation is one of the most common gastrointestinal complaints in the [place]. More than 4 million Americans have frequent constipation, accounting for 2.5 million physician visits a year. Those reporting constipation most often are women and adults ages 29 and older. Pregnant women may have constipation, and it is a common problem following childbirth or surgery.  Self-treatment of constipation with over-the-counter (OTC)  laxatives is by far the most common aid. Around $725 million is spent on laxative products each year in [place].  To understand constipation, it helps to know how the colon, or large intestine, works. As food moves through the colon, the colon absorbs water from the food while it forms waste products, or stool. Muscle contractions in the colon then push the stool toward the rectum. By the time stool reaches the rectum it is solid, because most of the water has been absorbed.  Constipation occurs when the colon absorbs too much water or if the colon's muscle contractions are slow or sluggish, causing the stool to move through the colon too slowly. As a result, stools can become hard and dry. Common causes of constipation are   not enough fiber in the diet    lack of physical activity (especially in the elderly)    medications    milk    irritable bowel syndrome    changes in life or routine such as pregnancy, aging, and travel    abuse of laxatives    ignoring the urge to have a bowel movement    dehydration    specific diseases or conditions, such as stroke (most common)    problems with the colon and rectum    problems with intestinal function (chronic idiopathic constipation)     How is the cause of constipation identified?  The tests the doctor performs depend on the duration and severity of  the constipation, the person's age, and whether blood in stools, recent changes in bowel habits, or weight loss have occurred. Most people with constipation do not need extensive testing and can be treated with changes in diet and exercise. For example, in young people with mild symptoms, a medical history and physical exam may be all that is needed for diagnosis and treatment.     A physical exam may include a rectal exam with a gloved, lubricated finger to evaluate the tone of the muscle that closes off the anus (anal sphincter) and to detect tenderness, obstruction, or blood. In some cases, blood and thyroid  tests may be necessary to look for thyroid disease and serum calcium or to rule out inflammatory, metabolic, and other disorders.  Extensive testing usually is reserved for people with severe symptoms, for those with sudden changes in the number and consistency of bowel movements or blood in the stool, and older adults. Additional tests that may be used to evaluate constipation include, a colorectal transit study, anorectal function tests, a defecography.  Because of an increased risk of colorectal cancer in older adults, the doctor may use tests to rule out a diagnosis of cancer, including a barium enema x ray, sigmoidoscopy or colonoscopy     Colorectal transit study:  This test shows how well food moves through the colon. The patient swallows capsules containing small markers that are visible on an x ray. The movement of the markers through the colon is monitored by abdominal x rays taken several times 3 to 7 days after the capsule is swallowed. The patient eats a high-fiber diet during the course of this test.    Anorectal function tests:  These tests diagnose constipation caused by abnormal functioning of the anus or rectum (anorectal function).   Anorectal manometry evaluates anal sphincter muscle function. For this test, a catheter or air-filled balloon is inserted into the anus and slowly pulled back through the sphincter muscle to measure muscle tone and contractions.    Balloon expulsion tests consist of filling a balloon with varying amounts of water after it has been rectally inserted. Then the patient is asked to expel the balloon. The inability to expel a balloon filled with less than 150 mL of water may indicate a decrease in bowel function.   Defecography is an x ray of the anorectal area that evaluates completeness of stool elimination, identifies anorectal abnormalities, and evaluates rectal muscle contractions and relaxation. During the exam, the doctor fills the rectum with a soft paste that is  the same consistency as stool. The patient sits on a toilet positioned inside an x-ray machine, then relaxes and squeezes the anus to expel the paste. The doctor studies the x rays for anorectal problems that occurred as the paste was expelled.    Barium enema x ray is an exam that involves viewing the rectum, colon, and lower part of the small intestine to locate problems. This part of the digestive tract is known as the bowel. This test may show intestinal obstruction and Hirschsprung's disease, which is a lack of nerves within the colon.  The night before the test, bowel cleansing, also called bowel prep, is necessary to clear the lower digestive tract. The patient drinks a special liquid to flush out the bowel. A clean bowel is important, because even a small amount of stool in the colon can hide details and result in an incomplete exam.  Because the colon does not show up well on x rays, the  doctor fills it with barium, a chalky liquid that makes the area visible. Once the mixture coats the inside of the colon and rectum, x rays are taken that show their shape and condition. The patient may feel some abdominal cramping when the barium fills the colon but usually feels little discomfort after the procedure. Stools may be white in color for a few days after the exam.    Sigmoidoscopy or colonoscopy:  An examination of the rectum and lower, or sigmoid, colon is called a sigmoidoscopy. An examination of the rectum and entire colon is called a colonoscopy.  The person usually has a liquid dinner the night before a colonoscopy or sigmoidoscopy and takes an enema early the next morning. An enema an hour before the test may also be necessary.  To perform a sigmoidoscopy, the doctor uses a long, flexible tube with a light on the end, called a sigmoidoscope, to view the rectum and lower colon. The patient is lightly sedated before the exam. First, the doctor examines the rectum with a gloved, lubricated finger. Then, the  sigmoidoscope is inserted through the anus into the rectum and lower colon. The procedure may cause abdominal pressure and a mild sensation of wanting to move the bowels. The doctor may fill the colon with air to get a better view. The air can cause mild cramping.  To perform a colonoscopy, the doctor uses a flexible tube with a light on the end, called a colonoscope, to view the entire colon. This tube is longer than a sigmoidoscope. During the exam, the patient lies on his or her side, and the doctor inserts the tube through the anus and rectum into the colon. If an abnormality is seen, the doctor can use the colonoscope to remove a small piece of tissue for examination (biopsy). The patient may feel gassy and bloated after the procedure.      How is constipation treated?  Although treatment depends on the cause, severity, and duration of the constipation, in most cases dietary and lifestyle changes will help relieve symptoms and help prevent them from recurring.    Diet  A diet with enough fiber (20 to 35 grams each day) helps the body form soft, bulky stool. A doctor or dietitian can help plan an appropriate diet. High-fiber foods include beans, whole grains and bran cereals, fresh fruits, and vegetables such as asparagus, Brussels sprouts, cabbage, and carrots. For people prone to constipation, limiting foods that have little or no fiber, such as ice cream, cheese, meat, and processed foods, is also important.    Lifestyle Changes  Other changes that may help treat and prevent constipation include drinking enough water and other liquids, such as fruit and vegetable juices and clear soups, so as not to become dehydrated, engaging in daily exercise, and reserving enough time to have a bowel movement. In addition, the urge to have a bowel movement should not be ignored.    Laxatives  Most people who are mildly constipated do not need laxatives. However, for those who have made diet and lifestyle changes and are  still constipated, a doctor may recommend laxatives or enemas for a limited time. These treatments can help retrain a chronically sluggish bowel. For children, short-term treatment with laxatives, along with retraining to establish regular bowel habits, helps prevent constipation.  People who are dependent on laxatives need to slowly stop using them. A doctor can assist in this process. For most people, stopping laxatives restores the colon's natural ability to contract.  Other Treatments  Treatment for constipation may be directed at a specific cause. For example, the doctor may recommend discontinuing medication or performing surgery to correct an anorectal problem such as rectal prolapse, a condition in which the lower portion of the colon turns inside out.  People with chronic constipation caused by anorectal dysfunction can use biofeedback to retrain the muscles that control bowel movements. Biofeedback involves using a sensor to monitor muscle activity, which is displayed on a computer screen, allowing for an accurate assessment of body functions. A health care professional uses this information to help the patient learn how to retrain these muscles.  Surgical removal of the colon may be an option for people with severe symptoms caused by colonic inertia. However, the benefits of this surgery must be weighed against possible complications, which include abdominal pain and diarrhea.    Can constipation be serious?  Sometimes constipation can lead to complications. These complications include hemorrhoids, caused by straining to have a bowel movement, or anal fissures (tears in the skin around the anus) caused when hard stool stretches the sphincter muscle. As a result, rectal bleeding may occur, appearing as bright red streaks on the surface of the stool. Treatment for hemorrhoids may include warm tub baths, ice packs, and application of a special cream to the affected area. Treatment for anal fissures may  include stretching the sphincter muscle or surgically removing the tissue or skin in the affected area.  Sometimes straining causes a small amount of intestinal lining to push out from the anal opening. This condition, known as rectal prolapse, may lead to secretion of mucus from the anus. Usually eliminating the cause of the prolapse, such as straining or coughing, is the only treatment necessary. Severe or chronic prolapse requires surgery to strengthen and tighten the anal sphincter muscle or to repair the prolapsed lining.  Constipation may also cause hard stool to pack the intestine and rectum so tightly that the normal pushing action of the colon is not enough to expel the stool. This condition, called fecal impaction, occurs most often in children and older adults. An impaction can be softened with mineral oil taken by mouth and by an enema. After softening the impaction, the doctor may break up and remove part of the hardened stool by inserting one or two fingers into the anus.

## 2014-03-14 NOTE — Progress Notes (Signed)
Paula Daugherty is a 83yrhere for follow-up with PCP  Chief Complaint   Patient presents with    Medication Follow Up     1. DM2: FSBS at 679for recent lowest; able to resolve it and recognized symptoms  2. HTN: She reports cough with ACE inh; would like to try ARB and recheck labs in 2 weeks   3. Depression: and fibromyalgia; reports no SE from Lexapro and feeling better already        ROS:   .Constitutional: no fever/chills.  CV: no chest pain, palpitations, shortness of breath or edema    Patient Active Problem List   Diagnosis    DM2 (diabetes mellitus, type 2)    Depression with anxiety    Stress at home    Leg cramps-right calf    Right ear pain    Fibromyalgia    HTN (hypertension)    GERD (gastroesophageal reflux disease)    Hyperlipidemia with target LDL less than 70    Vitamin D insufficiency    Abnormal LFTs    Renal cyst ( 6.4 cm cyst left kidney)    Cough    Type 2 diabetes mellitus without complication       Current Outpatient Prescriptions on File Prior to Visit   Medication Sig Dispense Refill    Amlodipine (NORVASC) 10 mg Tablet Take 1 tablet by mouth every day. 90 tablet 1    Aspirin 81 mg DR Tablet EC Tablet Take 1 tablet by mouth every day. 30 tablet 5    Atorvastatin (LIPITOR) 10 mg Tablet Take 1 tablet by mouth every day at bedtime. 90 tablet 3    Blood Sugar Diagnostic (ONETOUCH ULTRA TEST) Strips Use for testing blood glucose 3 times per day 300 strip 3    CALCIUM-MAGNESIUM-ZINC PO       Cholecalciferol, Vitamin D3, (VITAMIN D-3) 2,000 unit Tablet Take 1 tablet by mouth every day.      Escitalopram (LEXAPRO) 5 mg Tablet Take 1 tablet by mouth every day. 30 tablet 1    Fexofenadine (ALLEGRA) 180 mg tablet Take 1 tablet by mouth every day. 30 tablet 11    Fluticasone (FLONASE) 50 mcg/actuation nasal spray Instill 2 sprays into EACH nostril every day. 16 g 1    GlipiZIDE (GLUCOTROL XL) 10 mg SR Tablet Take 1 tablet by mouth every morning with a meal. (diabetes) 30 tablet 5     Metformin (GLUCOPHAGE) 1,000 mg tablet Take 1 tablet by mouth 2 times daily with meals. (diabetes) 60 tablet 5    Omeprazole (PRILOSEC) 20 mg Delayed Release Capsule TAKE ONE CAPSULE BY MOUTH EVERY DAY 90 capsule 0    Oxybutynin (DITROPAN) 5 mg Tablet Take 5 mg by mouth 3 times daily.       No current facility-administered medications on file prior to visit.     Allergies:   Allergies   Allergen Reactions    Contrast Dye [Radiopaque Agent] Hives    Hydrochlorothiazide Other-Reaction in Comments     Patient reports    Januvia [Sitagliptin] Other-Reaction in Comments     Patient reports    Lyrica [Pregabalin] Other-Reaction in Comments     Patient reports    Omnipaque [Iohexol] Other-Reaction in Comments     Patient reports    Prozac [Fluoxetine Hcl] Other-Reaction in Comments     Patient reports    Sulfa (Sulfonamide Antibiotics) Rash    Topiramate Other-Reaction in Comments     Patient reports  History     Social History    Marital Status: MARRIED     Spouse Name: N/A     Number of Children: 1    Years of Education: N/A     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[employer]       Social History Main Topics    Smoking status: Former Smoker -- 0.10 packs/day for 20 years    Smokeless tobacco: Never Used    Alcohol Use: 0.0 oz/week     0 Not specified per week      Comment: rare shares a beer or glass wine     Drug Use: No    Sexual Activity:     Partners: Male     Other Topics Concern    Not on file     Social History Narrative     Family History   Problem Relation Age of Onset    Diabetes Father     Heart Mother      MI at 25s     Non-contributory Brother     Non-contributory Brother        PE:  BP 120/66 mmHg  Pulse 84  Temp(Src) 36 C (96.8 F) (Temporal)  Wt 84.777 kg (186 lb 14.4 oz)  LMP 05/06/1983  .General Appearance: healthy, alert, no distress, pleasant affect, cooperative.  Heart:  normal rate and regular rhythm, no murmurs, clicks, or gallops.  Lungs: clear to  auscultation.  Extremities:  no cyanosis, clubbing, or edema.  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  (primary encounter diagnosis)  Comment: cough with ARB   Plan: Losartan (COZAAR) 25 mg Tablet, BASIC METABOLIC        PANEL          (I10) Essential hypertension  Comment: same   Plan: to check labs in 2 weeks     (M79.7) Fibromyalgia  Comment: stable   Plan: to monitor     (F41.8) Depression with anxiety  Comment: better with Lexapro   Plan: to monitor       Total encounter time including history, physical examination, and coordination of care was approximately 20 minutes of which more than 50% was spent counseling regarding assessment/diagnosis and treatment plan regarding DM2, HTN and change to ARB and depression--patient verbalized understanding. No guarantees were made regarding her medical care or treatment outcome. Barriers to Learning: none.  Patient verbalizes understanding of teaching and instructions.    Electronically signed by:    [name], MD  [place], [place] Board of Family Medicine  Associate physician [facility], [place]   [phone]

## 2014-03-14 NOTE — Nursing Note (Signed)
Vital signs taken, allergies and pharmacy verified. Screened for pain.

## 2014-03-20 ENCOUNTER — Ambulatory Visit: Payer: Medicare Other | Attending: Family Medicine

## 2014-03-20 DIAGNOSIS — R05 Cough: Secondary | ICD-10-CM

## 2014-03-20 NOTE — Progress Notes (Signed)
Outpatient pulmonary function test(s) and/or exercise oximetry have been completed as ordered. Preliminary test(s) results are now available.    PHYSICIAN INTERPRETED REPORT TO FOLLOW WITHIN 7 DAYS.    Scout Guyett, BSRC, RRT-NPS, RPFT  Staff Respiratory Therapist II    [facility], Outpatient Pulmonary Services  [phone] ([place]) Tue - Wed 0800 - 1630 (1st, 3rd & 5th Monday)  [phone] ([place]) Thu - Fri 0800 - 1630 (2nd & 4th Monday)

## 2014-03-22 LAB — PULMONARY FUNCTION TEST COMPLETE  (PFT) - PULMONARY LAB
DLCO (POST): 16.8 mL/mmHg/min (ref 0.05–99.99)
FEV1 (POST): 1.94 L (ref 0–12)
FEV1 (PRE): 1.73 L (ref 0–12)
FEV1/FVC (POST): 81 % (ref 0–12)
FEV1/FVC (PRE): 76 % (ref 0–12)
FVC (POST): 2.39 L (ref 0–12)
FVC (PRE): 2.28 L (ref 0–12)
TLC (PRE): 3.73 L (ref 0.05–11.99)

## 2014-03-27 ENCOUNTER — Other Ambulatory Visit: Payer: Self-pay | Admitting: "Endocrinology

## 2014-03-27 DIAGNOSIS — E1129 Type 2 diabetes mellitus with other diabetic kidney complication: Secondary | ICD-10-CM

## 2014-03-27 MED ORDER — CANAGLIFLOZIN 100 MG TABLET
100.0000 mg | ORAL_TABLET | Freq: Every day | ORAL | Status: DC
Start: 2014-03-27 — End: 2014-06-07

## 2014-03-28 ENCOUNTER — Ambulatory Visit: Payer: Self-pay

## 2014-04-06 ENCOUNTER — Encounter: Payer: Self-pay | Admitting: Family Medicine

## 2014-04-06 DIAGNOSIS — R059 Cough, unspecified: Secondary | ICD-10-CM

## 2014-04-06 DIAGNOSIS — R05 Cough: Principal | ICD-10-CM

## 2014-04-06 NOTE — Telephone Encounter (Signed)
From: [name]  To: Rande Brunt, DO  Sent: 04/06/2014 4:33 PM PST  Subject: [place] Advice Question    Hi doctor, I am wondering if you have had time to review my test results. If so what are your thoughts and what are our next steps? I still have the cough. I did the disgusting nasal washes. Lol Took the Allegra and the Flonase. All with no changes. I need something to kill the cough.   Look forward to hearing from you. Thanks, [name]

## 2014-04-10 ENCOUNTER — Ambulatory Visit (INDEPENDENT_AMBULATORY_CARE_PROVIDER_SITE_OTHER): Payer: Medicare Other | Admitting: Optometrist

## 2014-04-10 ENCOUNTER — Ambulatory Visit (INDEPENDENT_AMBULATORY_CARE_PROVIDER_SITE_OTHER): Payer: Medicare Other

## 2014-04-10 ENCOUNTER — Ambulatory Visit: Payer: Medicare Other | Attending: Family Medicine | Admitting: Family Medicine

## 2014-04-10 ENCOUNTER — Encounter: Payer: Self-pay | Admitting: Family Medicine

## 2014-04-10 VITALS — BP 123/71 | HR 73 | Temp 98.5°F | Wt 184.6 lb

## 2014-04-10 DIAGNOSIS — I10 Essential (primary) hypertension: Secondary | ICD-10-CM

## 2014-04-10 DIAGNOSIS — R05 Cough: Secondary | ICD-10-CM

## 2014-04-10 DIAGNOSIS — R059 Cough, unspecified: Secondary | ICD-10-CM

## 2014-04-10 DIAGNOSIS — H5203 Hypermetropia, bilateral: Secondary | ICD-10-CM

## 2014-04-10 DIAGNOSIS — Z638 Other specified problems related to primary support group: Secondary | ICD-10-CM | POA: Insufficient documentation

## 2014-04-10 DIAGNOSIS — Z135 Encounter for screening for eye and ear disorders: Secondary | ICD-10-CM

## 2014-04-10 DIAGNOSIS — E119 Type 2 diabetes mellitus without complications: Secondary | ICD-10-CM

## 2014-04-10 DIAGNOSIS — H02836 Dermatochalasis of left eye, unspecified eyelid: Secondary | ICD-10-CM

## 2014-04-10 DIAGNOSIS — E1129 Type 2 diabetes mellitus with other diabetic kidney complication: Secondary | ICD-10-CM

## 2014-04-10 DIAGNOSIS — H2513 Age-related nuclear cataract, bilateral: Secondary | ICD-10-CM

## 2014-04-10 DIAGNOSIS — F418 Other specified anxiety disorders: Secondary | ICD-10-CM

## 2014-04-10 DIAGNOSIS — H25813 Combined forms of age-related cataract, bilateral: Secondary | ICD-10-CM

## 2014-04-10 DIAGNOSIS — H02833 Dermatochalasis of right eye, unspecified eyelid: Secondary | ICD-10-CM

## 2014-04-10 DIAGNOSIS — H524 Presbyopia: Secondary | ICD-10-CM

## 2014-04-10 DIAGNOSIS — F439 Reaction to severe stress, unspecified: Secondary | ICD-10-CM

## 2014-04-10 LAB — COMPREHENSIVE METABOLIC PANEL
Alanine Transferase (ALT): 62 U/L — ABNORMAL HIGH (ref 5–54)
Albumin: 3.9 g/dL (ref 3.2–4.6)
Alkaline Phosphatase (ALP): 108 U/L (ref 35–115)
Aspartate Transaminase (AST): 57 U/L — ABNORMAL HIGH (ref 15–43)
Bilirubin Total: 1 mg/dL (ref 0.3–1.3)
Calcium: 9.8 mg/dL (ref 8.6–10.5)
Carbon Dioxide Total: 27 mEq/L (ref 24–32)
Chloride: 101 mEq/L (ref 95–110)
Creatinine Blood: 1.21 mg/dL (ref 0.44–1.27)
E-GFR, African American: 54 SEE NOTE — ABNORMAL LOW (ref >60)
E-GFR, Non-African American: 45 SEE NOTE — ABNORMAL LOW (ref >60)
Glucose: 116 mg/dL — ABNORMAL HIGH (ref 70–99)
Potassium: 3.6 mEq/L (ref 3.3–5.0)
Protein: 7.8 g/dL (ref 6.3–8.3)
Sodium: 139 mEq/L (ref 135–145)
Urea Nitrogen, Blood (BUN): 29 mg/dL — ABNORMAL HIGH (ref 8–22)

## 2014-04-10 LAB — HEMOGLOBIN A1C
Hgb A1C,Glucose Est Avg: 171 mg/dL
Hgb A1C: 7.6 % — ABNORMAL HIGH (ref 3.9–5.6)

## 2014-04-10 MED ORDER — ESCITALOPRAM 10 MG TABLET
10.0000 mg | ORAL_TABLET | Freq: Every day | ORAL | Status: DC
Start: 2014-04-10 — End: 2014-06-07

## 2014-04-10 MED ORDER — INHALATIONAL SPACING DEVICE
Status: AC
Start: 2014-04-10 — End: 2015-05-15

## 2014-04-10 MED ORDER — ALBUTEROL SULFATE HFA 90 MCG/ACTUATION AEROSOL INHALER: 1.0000 | INHALATION_SPRAY | RESPIRATORY_TRACT | Status: DC | PRN

## 2014-04-10 NOTE — Progress Notes (Signed)
Chief Complaint   Patient presents with    Diabetic management     [phone] female here for new patient diabetic eye exam; LEE:1 yr  CC: Pt is bothered by droopy upper lids. feels vision is a little blurry ou- gradual change. History of cataracts- pt concerned of status     Lab Results   Lab Name Value Date/Time    HGBA1C 7.0* 12/08/2013 10:30 AM    HGBA1C 7.0* 07/11/2013 09:57 AM     Assessment/Plan:    1. 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  No evidence of diabetic retinopathy nor clinically significant macular edema was present in either eye.  I emphasized the long-term risk to vision from diabetes and the importance of strict glycemic control, blood pressure control, along with regular eye exams to detect diabetic retinopathy at an early stage to prevent vision loss. She should return in one year for a dilated eye examination, or sooner if any changes are noticed.    2.  Nuclear sclerosis cataract OU. Best corrected visual acuity only 20/40. Discused option of cataract extraction with patient. Will defer for now and try new glasses.  Monitor.     3. Dermatochalasis UL OU. Bothersome. Will schedule surgical consultation with Dr. Luane School.     4. Hyperopic presbyope. Released a glasses prescription to the patient.      Return to clinic in one year for a general eye examination or sooner if any changes are noticed.    Ciria Bernardini Pederson-VanBuskirk, O.D, F.A.A.O.  Energy manager of Ophthalmology

## 2014-04-10 NOTE — Patient Instructions (Signed)
A laboratory test has been ordered for you.

## 2014-04-10 NOTE — Progress Notes (Signed)
Paula Daugherty is a 48yrhere for follow-up with PCP  Chief Complaint   Patient presents with    Medication Follow Up    Cough     1. Cough: occasional tightness; PFT with possible benefit from inhaler; patient amenable to follow-up with DR SJulianne Handlerand try ALbuterol prn; denies GERD  2. DM2: patient reports occasional FSBS in 50s-60s; associated jittery sensation; aware of need to carry ways to reverse low blood sugars   3. Depression: reports benefit from Lexapro 5 mg; no SE; patient would like to try 10 mg       ROS:   .Constitutional: no fever/chills.  CV: no chest pain, palpitations, shortness of breath or edema    Patient Active Problem List   Diagnosis    DM2 (diabetes mellitus, type 2)    Depression with anxiety    Stress at home    Leg cramps-right calf    Right ear pain    Fibromyalgia    HTN (hypertension)    GERD (gastroesophageal reflux disease)    Hyperlipidemia with target LDL less than 70    Vitamin D insufficiency    Abnormal LFTs    Renal cyst ( 6.4 cm cyst left kidney)    Cough    Type 2 diabetes mellitus without complication       Current Outpatient Prescriptions on File Prior to Visit   Medication Sig Dispense Refill    Amlodipine (NORVASC) 10 mg Tablet Take 1 tablet by mouth every day. 90 tablet 1    Aspirin 81 mg DR Tablet EC Tablet Take 1 tablet by mouth every day. 30 tablet 5    Atorvastatin (LIPITOR) 10 mg Tablet Take 1 tablet by mouth every day at bedtime. 90 tablet 3    Blood Sugar Diagnostic (ONETOUCH ULTRA TEST) Strips Use for testing blood glucose 3 times per day 300 strip 3    CALCIUM-MAGNESIUM-ZINC PO       canagliflozin (INVOKANA) 100 mg Tablet Take 1 tablet by mouth every day. 30 tablet 6    Cholecalciferol, Vitamin D3, (VITAMIN D-3) 2,000 unit Tablet Take 1 tablet by mouth every day.      Fexofenadine (ALLEGRA) 180 mg tablet Take 1 tablet by mouth every day. 30 tablet 11    Fluticasone (FLONASE) 50 mcg/actuation nasal spray Instill 2 sprays into EACH nostril  every day. 16 g 1    GlipiZIDE (GLUCOTROL XL) 10 mg SR Tablet Take 1 tablet by mouth every morning with a meal. (diabetes) 30 tablet 5    Losartan (COZAAR) 25 mg Tablet Take 1 tablet by mouth every day. 30 tablet 11    Metformin (GLUCOPHAGE) 1,000 mg tablet Take 1 tablet by mouth 2 times daily with meals. (diabetes) 60 tablet 5    Omeprazole (PRILOSEC) 20 mg Delayed Release Capsule TAKE ONE CAPSULE BY MOUTH EVERY DAY 90 capsule 0    Oxybutynin (DITROPAN) 5 mg Tablet Take 5 mg by mouth 3 times daily.       No current facility-administered medications on file prior to visit.     Allergies:   Allergies   Allergen Reactions    Contrast Dye [Radiopaque Agent] Hives    Hydrochlorothiazide Other-Reaction in Comments     Patient reports    Januvia [Sitagliptin] Other-Reaction in Comments     Patient reports    Lyrica [Pregabalin] Other-Reaction in Comments     Patient reports    Omnipaque [Iohexol] Other-Reaction in Comments     Patient reports  Prozac [Fluoxetine Hcl] Other-Reaction in Comments     Patient reports    Sulfa (Sulfonamide Antibiotics) Rash    Topiramate Other-Reaction in Comments     Patient reports       History     Social History    Marital Status: MARRIED     Spouse Name: N/A     Number of Children: 1    Years of Education: N/A     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[employer]       Social History Main Topics    Smoking status: Former Smoker -- 0.10 packs/day for 20 years    Smokeless tobacco: Never Used    Alcohol Use: 0.0 oz/week     0 Not specified per week      Comment: rare shares a beer or glass wine     Drug Use: No    Sexual Activity:     Partners: Male     Other Topics Concern    Not on file     Social History Narrative     Family History   Problem Relation Age of Onset    Diabetes Father     Heart Mother      MI at 53s     Non-contributory Brother     Non-contributory Brother        PE:  BP 123/71 mmHg  Pulse 73  Temp(Src) 36.9 C (98.5 F) (Temporal)   Wt 83.734 kg (184 lb 9.6 oz)  LMP 05/06/1983   General Appearance: healthy, alert, no distress, pleasant affect, cooperative.  Eyes:  clear.  Ears:  normal TMs and canal and external inspection of ears show no abnormality.  Nose:  mucosa erythematous and swollen, no tenderness to palpation along sinuses.  Mouth: OP clear.  Neck:  Supple; no LAD.  Heart:  normal rate and regular rhythm, no murmurs, clicks, or gallops.  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  Comment: occasional low FSBS at 50s-60s   Plan: COMPREHENSIVE METABOLIC PANEL, HEMOGLOBIN A1C        May need adjustment in meds     (I10) Essential hypertension  Comment: at goal; on ARB   Plan: to monitor     (F41.8) Depression with anxiety  Comment: on Lexapro   Plan: to increase to 10     (Z63.8) Stress at home  Comment: dad with possible nursing home placement   Plan: Escitalopram (LEXAPRO) 10 mg Tablet              Total encounter time including history, physical examination, and coordination of care was approximately 20 minutes of which more than 50% was spent counseling regarding assessment/diagnosis and treatment plan regarding mood, Dm and cough--patient verbalized understanding. No guarantees were made regarding her medical care or treatment outcome. Barriers to Learning: none.  Patient verbalizes understanding of teaching and instructions.    Electronically signed by:  [name], MD  Diplomat, [employer] Board of Family Medicine  Associate physician [facility], [place]    [phone]

## 2014-04-10 NOTE — Nursing Note (Signed)
Vital signs taken, allergies and pharmacy verified. Screened for pain.

## 2014-04-10 NOTE — Progress Notes (Signed)
Pohx:  1. DM x 10-2yr  2. Cataracts    Fohx:  1. Glaucoma-Maternal Grandfather    Lab Results   Lab Name Value Date/Time    HGBA1C 7.0* 12/08/2013 10:30 AM    HGBA1C 7.0* 07/11/2013 09:57 AM     Patient Active Problem List   Diagnosis    DM2 (diabetes mellitus, type 2)    Depression with anxiety    Stress at home    Leg cramps-right calf    Right ear pain    Fibromyalgia    HTN (hypertension)    GERD (gastroesophageal reflux disease)    Hyperlipidemia with target LDL less than 70    Vitamin D insufficiency    Abnormal LFTs    Renal cyst ( 6.4 cm cyst left kidney)    Cough    Type 2 diabetes mellitus without complication

## 2014-04-11 MED ORDER — BECLOMETHASONE DIPROPIONATE 80 MCG/ACTUATION AEROSOL INHALER
1.0000 | INHALATION_SPRAY | Freq: Two times a day (BID) | RESPIRATORY_TRACT | Status: DC
Start: 2014-04-11 — End: 2014-08-17

## 2014-04-11 MED ORDER — ALBUTEROL SULFATE HFA 90 MCG/ACTUATION AEROSOL INHALER: 1.0000 | INHALATION_SPRAY | RESPIRATORY_TRACT | Status: DC | PRN

## 2014-05-03 ENCOUNTER — Ambulatory Visit: Payer: Medicare Other | Admitting: Optometrist

## 2014-05-09 ENCOUNTER — Ambulatory Visit: Payer: Medicare Other | Admitting: Adult Health

## 2014-05-16 ENCOUNTER — Other Ambulatory Visit: Payer: Self-pay | Admitting: Family Medicine

## 2014-05-16 NOTE — Telephone Encounter (Signed)
Last seen by PCP on 04/10/14.

## 2014-05-23 ENCOUNTER — Ambulatory Visit: Payer: Medicare Other | Admitting: "Endocrinology

## 2014-05-23 VITALS — BP 122/74 | HR 69 | Temp 98.2°F | Ht 62.0 in | Wt 177.5 lb

## 2014-05-23 DIAGNOSIS — E1129 Type 2 diabetes mellitus with other diabetic kidney complication: Secondary | ICD-10-CM

## 2014-05-23 NOTE — Nursing Note (Signed)
Vital signs taken, allergies verified, screened for pain, and pharmacy verified.  Cory O'Dell,MA

## 2014-05-23 NOTE — Patient Instructions (Signed)
1. No changes to medications today    2. Please have fasting blood test done before follow up appointment in 3 months    3. Return to clinic in 3 months

## 2014-05-23 NOTE — Progress Notes (Signed)
Endocrinology Follow up clinic note:    Chief Complaint:  "I'm here to follow up on my diabetes."    HPI: Paula Daugherty is a 69yrold female who has a diagnosis of type 2 diabetes mellitus who presetns to Endocrinology for follow up. Her history is as follows:  She was initially diagnosed with diabetes about 15 years ago on routine labs. She has a strong family history of diabetes so she wasn't surprised at this. She was started on Metformin about 9 years ago and then she was started on insulin 2 years ago. She was up to 64 units of Lantus at night. She was taken off insulin a few months ago due to concerns about injections. Since this time, she has been monitoring her blood glucose and she states that on a routine basis, her blood glucose is >130 most of the time. She states that she is not very compliant with a diabetic diet and believes that this is a major contributor to why her glycemic control is not at goal.     She also has a history of an elevated alk phos level. A GGT level was checked and was found to be elevated. A bone specific alkaline phosphatase was also found to be elevated. PTH level was normal.     Interim History: She returns today for follow up. Her last visit with Endocrinology was on 02/14/2014. At this visit, she was encouraged to increase her home blood glucose monitoring to at least BID. Since this time, she has started Invokana. She thinks that with this medication, her blood sugar is much better controlled. She notices that there are some times when she does not eat a few times due to decreased appetite. She will sometimes drop to the 50s when she skips meals. She denies any urinary tract infection or yeast infection symptoms.   [place] parents as her father is not in good health and this was stressful trip for her.     She has lost almost 10 lbs in the past   Current regimen:    Glipizide XL 10 mg daily   Metformin 1000 mg BID   Invokana  100 mg daily  Hypoglycemia: Yes, will decrease to the 50s if she misses meals   Treatment: Juice or soft drink   Awareness: Yes <80  Hyperglycemia: Improved overall    Meter brought today: No  Checks blood glucose: 1-2x/day (usually in the morning, fasting)   Home blood glucose log brought to clinic today: No  Home blood glucose numbers per pt's log:   Blood Glucose:    Pre-breakfast: 100s to 120s, will occasionally increase due to dietary choices     Diet and exercise:    Breakfast: Trying to do more protein (eggs) and fruits, will sometimes have Frozen waffles or cereal with bananas, bacon sometimes, scrambled eggs or yogurt, coffee with milk   Lunch: Has been eating more at home (soups, fruit, rye bread sandwich), water   Dinner: Salads, baked chicken, spaghetti, ham and scallop potatoes, vegetables   Snacks: Ties to eat something healthy during the day (fruit or peanut butter on crackers)   Exercise: Has been walking more frequently when [place]  Last eye exam: 04/2014, no retinopathy, f/u 1 year  Foot exam: Will do today     ROS:  All other systems were reviewed and are negatative except for pertinent positive and negative responses as  documented in HPI.     Medications:  Medication reconciliation was performed today.   Current Outpatient Prescriptions on File Prior to Visit   Medication Sig Dispense Refill    Albuterol (PROAIR HFA, PROVENTIL HFA, VENTOLIN HFA) 90 mcg/actuation inhaler Take 1-2 puffs by inhalation every 4 hours if needed. 1 inhaler 1    Albuterol (PROAIR HFA, PROVENTIL HFA, VENTOLIN HFA) 90 mcg/actuation inhaler Take 1-2 puffs by inhalation every 4 hours if needed. 1 inhaler 1    Amlodipine (NORVASC) 10 mg Tablet Take 1 tablet by mouth every day. 90 tablet 1    Aspirin 81 mg DR Tablet EC Tablet Take 1 tablet by mouth every day. 30 tablet 5    Atorvastatin (LIPITOR) 10 mg Tablet Take 1 tablet by mouth every day at bedtime. 90 tablet 3    Beclomethasone  (QVAR) 80 mcg/actuation Inhaler Take 1 puff by inhalation 2 times daily. rinse mouth after each use 8.7 g 1    Blood Sugar Diagnostic (ONETOUCH ULTRA TEST) Strips Use for testing blood glucose 3 times per day 300 strip 3    CALCIUM-MAGNESIUM-ZINC PO       canagliflozin (INVOKANA) 100 mg Tablet Take 1 tablet by mouth every day. 30 tablet 6    Cholecalciferol, Vitamin D3, (VITAMIN D-3) 2,000 unit Tablet Take 1 tablet by mouth every day.      Escitalopram (LEXAPRO) 10 mg Tablet Take 1 tablet by mouth every day. 30 tablet 11    Fexofenadine (ALLEGRA) 180 mg tablet Take 1 tablet by mouth every day. 30 tablet 11    Fluticasone (FLONASE) 50 mcg/actuation nasal spray Instill 2 sprays into EACH nostril every day. 16 g 1    GlipiZIDE (GLUCOTROL XL) 10 mg SR Tablet Take 1 tablet by mouth every morning with a meal. (diabetes) 30 tablet 5    Inhalational Spacing Device (AEROCHAMBER/FLOWSIGNAL) Spacer To use with inhaler as directed 1 each 1    Losartan (COZAAR) 25 mg Tablet Take 1 tablet by mouth every day. 30 tablet 11    Metformin (GLUCOPHAGE) 1,000 mg tablet Take 1 tablet by mouth 2 times daily with meals. (diabetes) 60 tablet 5    Omeprazole (PRILOSEC) 20 mg Delayed Release Capsule TAKE ONE CAPSULE BY MOUTH EVERY DAY 90 capsule 0    Oxybutynin (DITROPAN) 5 mg Tablet Take 5 mg by mouth 3 times daily.       No current facility-administered medications on file prior to visit.     I did review patient's past medical and family/social history, no changes noted.   PMH:  Past medical history was reviewed from problem list.   Patient Active Problem List   Diagnosis    DM2 (diabetes mellitus, type 2)    Depression with anxiety    Stress at home    Leg cramps-right calf    Right ear pain    Fibromyalgia    HTN (hypertension)    GERD (gastroesophageal reflux disease)    Hyperlipidemia with target LDL less than 70    Vitamin D insufficiency    Abnormal LFTs    Renal cyst ( 6.4 cm cyst left kidney)    Cough     Type 2 diabetes mellitus without complication     VITAL SIGNS:  Temp: 36.8 C (98.2 F) (01/19 0948)  Temp src: Tympanic (01/19 0948)  Pulse: 69 (01/19 0948)  BP: 122/74 mmHg (01/19 0948)  Resp: --  SpO2: 99 % (01/19 0948)  Height: 157.5 cm (_0 ) (01/19 0948)  Weight: 80.513 kg (177 lb 8 oz) (01/19 0948)    PHYSICAL EXAM:  General Appearance: healthy, alert, no distress, pleasant affect, cooperative.  Eyes:  conjunctivae and corneas clear. PERRL, EOM's intact. sclerae normal.  Mouth: normal.  Neck:  Neck supple. No adenopathy, thyroid symmetric, normal size.  Heart:  normal rate and regular rhythm, no murmurs, clicks, or gallops.  Lungs: clear to auscultation.  Abdomen: BS normal.  Abdomen soft, non-tender.  No masses or organomegaly.  Extremities:  no cyanosis, clubbing, or edema.  Foot Exam: normal DP and PT pulses and no trophic changes or ulcerative lesions. Decreased sensation to monofilament exam bilaterally  Skin:  Skin color, texture, turgor normal. No rashes or lesions.  Neuro: Gait normal. No tremor appreciated. Sensation and strength grossly normal.  Mental Status: Appearance/Cooperation: in no apparent distress and well developed and well nourished  Eye Contact: normal  Speech: normal volume, rate, and pitch      LAB TESTS/STUDIES:   I personally reviewed the following laboratory studies.     06/07/2014 09:37   SODIUM 142   POTASSIUM 3.8   CHLORIDE 102   CARBON DIOXIDE TOTAL 30   UREA NITROGEN, BLOOD (BUN) 14   CREATININE BLOOD 0.85   E-GFR, AFRICAN AMERICAN >60   E-GFR, NON-AFRICAN AMERICAN >60   GLUCOSE 118 (H)   CALCIUM 9.5      03/01/2014 13:05 04/10/2014 15:05   SODIUM 141 139   POTASSIUM 4.0 3.6   CHLORIDE 102 101   CARBON DIOXIDE TOTAL 28 27   UREA NITROGEN, BLOOD (BUN) 13 29 (H)   CREATININE BLOOD 0.78 1.21   E-GFR, AFRICAN AMERICAN >60 54 (L)   E-GFR, NON-AFRICAN AMERICAN >60 45 (L)   GLUCOSE 146 (H) 116 (H)   CALCIUM 9.8 9.8   PROTEIN 8.4 (H) 7.8   ALBUMIN 4.0 3.9   ALKALINE PHOSPHATASE (ALP)  137 (H) 108   ASPARTATE TRANSAMINASE (AST) 80 (H) 57 (H)   BILIRUBIN TOTAL 0.9 1.0   ALANINE TRANSFERASE (ALT) 81 (H) 62 (H)   HGB A1C  7.6 (H)   HGB A1C,GLUCOSE EST AVG  171      08/19/2013 12:03   FASTING YES   CHOLESTEROL 177   TRIGLYCERIDE 122   LDL CHOLESTEROL CALCULATION 93   HDL CHOLESTEROL 60   NON-HDL CHOLESTEROL 117   TOTAL CHOLESTEROL:HDL RATIO 3.0      12/14/2013 14:55   WHITE BLOOD CELL COUNT 10.9   HEMOGLOBIN 13.8   HEMATOCRIT 42.6   MCV 85.8   RDW 14.0   PLATELET COUNT 284      12/08/2013 10:30 12/14/2013 14:55 01/03/2014 10:11 02/21/2014 13:41   GAMMA GLUTAMYL TRANSFERASE(GGT 96 (H)      MAGNESIUM (MG)   1.7    THYROID STIMULATING HORMONE 1.58      AFP CANCER MARKER 5.2      HGB A1C 7.0 (H)      HGB A1C,GLUCOSE EST AVG 154      PARATHYROID HORMONE INTACT  65     PROTEIN ELECTROPHORESIS,SERUM  Rpt     VITAMIN D, 25 HYDROXY 30.2        Result Date - 12/10/2013      Component Results     Component Value Ref Range & Units Status    ALK-PHOSPHATASE 138 (H) 40-120 U/L Final    ALK-PHOSPHATASE LIVER CALC 76 0-94 U/L Final    ALK-PHOSPHATASE BONE CALC 62 (H) 0-55 U/L Final    ALK-PHOSPHATASE OTHER CALC 0 ()  U/L Final     02/24/2014 9:55 PM - Lab Results Interface      Component Results     Component Value Ref Range & Units Status    ALK-PHOSPHATASE 145 (H) 40 - 120 U/L Final    ALK-PHOSPHATASE LIVER CALC 91 0 - 94 U/L Final    ALK-PHOSPHATASE BONE CALC 54 0 - 55 U/L Final    ALK-PHOSPHATASE OTHER CALC 0 () U/L Final         02/21/2014 13:52   CREATININE SPOT URINE 530.26   MICROALBUMIN URINE 176.4   MICROALBUMIN/CREATININE RATIO 333 (H)       Abdominal ultrasound (12/28/2013):  FINDINGS:  Pancreatic head and body are unremarkable. Tail is obscured by bowel gas.  Abdominal aorta is nonaneurysmal. Intrahepatic IVC is patent. The liver is  enlarged measuring 21 cm and demonstrates increased echogenicity. The main  portal vein is patent and hepatopedal. Gallbladder is present. No  gallstones are seen. There is no evidence  of acute cholecystitis. CBD is  nondilated measuring 3 mm.  The right kidney is 11.6 cm in length and appears unremarkable. The left  kidney is 10.8 cm in length and demonstrates a midpole cyst measuring 6.4 x  4.4 x 5.7 cm, stable since prior. There is a thick septation within the  cyst. No vascular flow is noted in the septation. Urinary bladder is  decompressed. No ascites is noted. Spleen was not imaged.    IMPRESSION:  1. Enlarged liver with increased echogenicity suggestive of fatty  infiltration.  2. Stable septated cyst in the left kidney midpole, Bosniak category 39F  cyst. Additional six-month followup ultrasound is recommended to ensure  Stability.    Impression: This is a 69yrold female with Diabetes Mellitus Type II, uncontrolled who presents to Endocrinology to discuss this further. Overall, the patient's glycemic control is near goal as evidenced by her A1c of 7.6% in 04/2014. Based on her reported blood glucose records that she reports today, I have not recommended any changes to her current diabetes medications.   She also has a history of an elevated alk phos level. GGT was elevated which suggests a liver source and she was found to have an enlarged liver with increased echogenicity suggestive of fatty infiltration on abdominal ultrasound in 12/2013.     DIABETES HISTORY  (-) h/o retinopathy.   (-) h/o microalbuminuria/nephropathy.  (-) h/o neuropathy.  (-) h/o Autonomic dysfunction  (-) h/o Gastroparesis  (-) h/o CAD  (-) h/o PVD  Last eye exam: 04/2014, f/u 1 year  Last urine microalbumin: 02/2014,333  Flu vaccination: 02/2014  (+) ASA  (+) Statin  (+) ACE/ARB        Recommendations:  (E11.29) Type 2 diabetes mellitus with other diabetic kidney complication  (primary encounter diagnosis)  - No changes made to patient's medication doses today  - Encouraged patient to continue close monitoring of blood glucose - goal at least 2x/day  - Last LDL at goal, continue statin, repeat fasting lipid panel  due 08/2014  - Blood pressure at goal, continue amlodipine, cozaar   - Last microalbumin:creatinine ratio elevated, continue cozaar  - Up to date on influenza vaccine  - Up to date on screening eye exam, next due 04/2015      Approximately 20 minutes were spent with patient, greater than 50% of which was spent counseling the patient on diabetes management and on coordination of care.     Follow up in 3 months    If  you have any questions, please do not hesitate to contact me at [phone].  Thank you for allowing me to participate in the care of this patient.    EDUCATION:  I educated/instructed the patient or caregiver regarding all aspects of the above stated plan of care.  The patient or caregiver indicated understanding.      [place] interpreter was not used.    Report electronically signed by:  Sunday Spillers, M.D.  Clinical Assistant Professor  Department of Endocrinology  Pager # [phone]

## 2014-05-24 ENCOUNTER — Ambulatory Visit: Payer: Self-pay | Admitting: Family Medicine

## 2014-05-25 ENCOUNTER — Ambulatory Visit: Payer: Medicare Other | Admitting: Ophthalmology

## 2014-05-25 DIAGNOSIS — H25813 Combined forms of age-related cataract, bilateral: Secondary | ICD-10-CM

## 2014-05-25 DIAGNOSIS — H02833 Dermatochalasis of right eye, unspecified eyelid: Secondary | ICD-10-CM

## 2014-05-25 DIAGNOSIS — H02836 Dermatochalasis of left eye, unspecified eyelid: Secondary | ICD-10-CM

## 2014-05-25 DIAGNOSIS — H02403 Unspecified ptosis of bilateral eyelids: Secondary | ICD-10-CM

## 2014-05-25 NOTE — Progress Notes (Signed)
Chief Complaint   Patient presents with    Consultation     N.P. referred by Dr. Lolita Patella for eval of Dermatochalasis BUL. CC: Lids droopy and bothersome.     Ophthalmic Plastic and Orbital Surgery Attending Note    HPI:  Paula Daugherty is a 69yrold female who [place] by her ophthalmologist and told that she needed both cataract surgery and eyelid surgery, but then she moved and did not have either done.    She notes she is constantly raising her eyebrows in order to help her see, and it is causing a dull headache and eye strain. She also tilts her chin up to see.  She has difficulty especially with reading.    Review of Systems was reviewed and notable for stress and a kidney cyst    PMH HTN, diabetes , depression, fibromyalgia, reflux, renal cyst, hyperlipidemia  SH former smoker  FH non contributory    Examination reveals brow position at the superior orbital rim.  There is excessive frontalis overaction.  There is  dermatochalasis with  lateral hooding. There is true eyelid ptosis ou with a deepened superior sulcus and loss of lid crease. There is no eyelid fatigue.    Pupils are equal without evidence of Horner's.  Extraocular motility are full OU. The globe position is without dystopia, enophthalmos or proptosis.    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 Cataract eval  2. Ptosis repair/bleph pending cataract eval.      LNyra Jabs MD  Ophthalmic Plastic and Orbital Surgery  Dept. of Ophthalmology & Visual Science    Electronically signed by : LNyra Jabs MD, Attending Ophthalmologist, PI [phone]

## 2014-05-25 NOTE — Procedures (Signed)
Visual field testing was performed in each eye, with the eyelids taped and untaped.  Testing revealed a superior visual field defect ou which was within 30 degrees of fixation OU. The defect involved nearly 50% of the superior visual field in both eyes.  On taped testing, there was improvement of the superior field deficit, and restoration of the normal central visual field in both eyes.    External photographs were taken today to document dermatochalasis and ptosis OU    Nyra Jabs, MD  Ophthalmic Plastic and Orbital Surgery  Dept. of Ophthalmology & Visual Science

## 2014-05-30 ENCOUNTER — Ambulatory Visit: Payer: Self-pay | Admitting: Family Medicine

## 2014-06-07 ENCOUNTER — Ambulatory Visit: Payer: Medicare Other | Attending: Family Medicine | Admitting: Family Medicine

## 2014-06-07 ENCOUNTER — Ambulatory Visit (INDEPENDENT_AMBULATORY_CARE_PROVIDER_SITE_OTHER): Payer: Medicare Other

## 2014-06-07 ENCOUNTER — Encounter: Payer: Self-pay | Admitting: Family Medicine

## 2014-06-07 VITALS — BP 130/74 | HR 69 | Temp 98.1°F | Wt 180.2 lb

## 2014-06-07 DIAGNOSIS — I10 Essential (primary) hypertension: Secondary | ICD-10-CM

## 2014-06-07 DIAGNOSIS — F418 Other specified anxiety disorders: Secondary | ICD-10-CM | POA: Insufficient documentation

## 2014-06-07 DIAGNOSIS — E1129 Type 2 diabetes mellitus with other diabetic kidney complication: Secondary | ICD-10-CM | POA: Insufficient documentation

## 2014-06-07 DIAGNOSIS — E119 Type 2 diabetes mellitus without complications: Secondary | ICD-10-CM | POA: Insufficient documentation

## 2014-06-07 LAB — BASIC METABOLIC PANEL
Calcium: 9.5 mg/dL (ref 8.6–10.5)
Carbon Dioxide Total: 30 mEq/L (ref 24–32)
Chloride: 102 mEq/L (ref 95–110)
Creatinine Blood: 0.85 mg/dL (ref 0.44–1.27)
E-GFR, African American: >60 SEE NOTE (ref >60)
E-GFR, Non-African American: >60 SEE NOTE (ref >60)
Glucose: 118 mg/dL — ABNORMAL HIGH (ref 70–99)
Potassium: 3.8 mEq/L (ref 3.3–5.0)
Sodium: 142 mEq/L (ref 135–145)
Urea Nitrogen, Blood (BUN): 14 mg/dL (ref 8–22)

## 2014-06-07 MED ORDER — ESCITALOPRAM 20 MG TABLET
20.0000 mg | ORAL_TABLET | Freq: Every day | ORAL | Status: DC
Start: 2014-06-07 — End: 2015-07-08

## 2014-06-07 MED ORDER — LOSARTAN 25 MG TABLET
25.0000 mg | ORAL_TABLET | Freq: Every day | ORAL | Status: DC
Start: 2014-06-07 — End: 2015-06-06

## 2014-06-07 MED ORDER — METFORMIN 1,000 MG TABLET: 1.0000 | ORAL_TABLET | Freq: Two times a day (BID) | ORAL | Status: DC

## 2014-06-07 MED ORDER — FLUCONAZOLE 150 MG TABLET
150.0000 mg | ORAL_TABLET | Freq: Once | ORAL | Status: DC
Start: 2014-06-07 — End: 2015-05-01

## 2014-06-07 MED ORDER — CANAGLIFLOZIN 100 MG TABLET
100.0000 mg | ORAL_TABLET | Freq: Every day | ORAL | Status: DC
Start: 2014-06-07 — End: 2015-03-27

## 2014-06-07 NOTE — Patient Instructions (Addendum)
An ultrasound has been ordered for you Please call [phone] to schedule an appointment in [place].     A laboratory test has been ordered for you--BMP only

## 2014-06-07 NOTE — Nursing Note (Signed)
Vital signs taken, allergies and pharmacy verified. Screened for pain.

## 2014-06-07 NOTE — Progress Notes (Signed)
Paula Daugherty is a 16yrhere for follow-up with PCP  Chief Complaint   Patient presents with    Other     6wk F/U     1. Cough: due to see pulmonary medicine--we appreciate the great care; on inhalers as instructed; also on PPI  2. DM: recent eye exam; reports low FSBS in 50-70s at times; [facility]  3. HTN: She reports .taking medications as instructed, no medication side effects noted, no TIA's, no chest pain on exertion, no dyspnea on exertion, no swelling of ankles; blood pressure reads: none; off ACE inh    ROS:   .Constitutional: no fever/chills.  CV: no chest pain, palpitations, shortness of breath or edema  Psych: reports overall better and would like to see how she does on Lexapro 20 mg; no SI or HI    Patient Active Problem List   Diagnosis    DM2 (diabetes mellitus, type 2)    Depression with anxiety    Stress at home    Leg cramps-right calf    Right ear pain    Fibromyalgia    HTN (hypertension)    GERD (gastroesophageal reflux disease)    Hyperlipidemia with target LDL less than 70    Vitamin D insufficiency    Abnormal LFTs    Renal cyst ( 6.4 cm cyst left kidney)    Cough    Type 2 diabetes mellitus without complication       Current Outpatient Prescriptions on File Prior to Visit   Medication Sig Dispense Refill    Albuterol (PROAIR HFA, PROVENTIL HFA, VENTOLIN HFA) 90 mcg/actuation inhaler Take 1-2 puffs by inhalation every 4 hours if needed. 1 inhaler 1    Albuterol (PROAIR HFA, PROVENTIL HFA, VENTOLIN HFA) 90 mcg/actuation inhaler Take 1-2 puffs by inhalation every 4 hours if needed. 1 inhaler 1    Amlodipine (NORVASC) 10 mg Tablet Take 1 tablet by mouth every day. 90 tablet 1    Aspirin 81 mg DR Tablet EC Tablet Take 1 tablet by mouth every day. 30 tablet 5    Atorvastatin (LIPITOR) 10 mg Tablet Take 1 tablet by mouth every day at bedtime. 90 tablet 3    Beclomethasone (QVAR) 80 mcg/actuation Inhaler Take  1 puff by inhalation 2 times daily. rinse mouth after each use 8.7 g 1    Blood Sugar Diagnostic (ONETOUCH ULTRA TEST) Strips Use for testing blood glucose 3 times per day 300 strip 3    CALCIUM-MAGNESIUM-ZINC PO       Cholecalciferol, Vitamin D3, (VITAMIN D-3) 2,000 unit Tablet Take 1 tablet by mouth every day.      Fexofenadine (ALLEGRA) 180 mg tablet Take 1 tablet by mouth every day. 30 tablet 11    Fluticasone (FLONASE) 50 mcg/actuation nasal spray Instill 2 sprays into EACH nostril every day. 16 g 1    GlipiZIDE (GLUCOTROL XL) 10 mg SR Tablet Take 1 tablet by mouth every morning with a meal. (diabetes) 30 tablet 5    Inhalational Spacing Device (AEROCHAMBER/FLOWSIGNAL) Spacer To use with inhaler as directed 1 each 1    Omeprazole (PRILOSEC) 20 mg Delayed Release Capsule TAKE ONE CAPSULE BY MOUTH EVERY DAY 90 capsule 0    Oxybutynin (DITROPAN) 5 mg Tablet Take 5 mg by mouth 3 times daily.       No current facility-administered medications on file prior to visit.     Allergies:   Allergies  Allergen Reactions    Contrast Dye [Radiopaque Agent] Hives    Hydrochlorothiazide Other-Reaction in Comments     Patient reports    Januvia [Sitagliptin] Other-Reaction in Comments     Patient reports    Lyrica [Pregabalin] Other-Reaction in Comments     Patient reports    Omnipaque [Iohexol] Other-Reaction in Comments     Patient reports    Prozac [Fluoxetine Hcl] Other-Reaction in Comments     Patient reports    Sulfa (Sulfonamide Antibiotics) Rash    Topiramate Other-Reaction in Comments     Patient reports       History     Social History    Marital Status: MARRIED     Spouse Name: N/A     Number of Children: 1    Years of Education: N/A     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[employer]       Social History Main Topics    Smoking status: Former Smoker -- 0.10 packs/day for 20 years    Smokeless tobacco: Never Used    Alcohol Use: 0.0 oz/week     0 Not specified per week      Comment:  rare shares a beer or glass wine     Drug Use: No    Sexual Activity:     Partners: Male     Other Topics Concern    Not on file     Social History Narrative     Family History   Problem Relation Age of Onset    Diabetes Father     Heart Mother      MI at 26s     Non-contributory Brother     Non-contributory Brother        PE:  BP 130/74 mmHg  Pulse 69  Temp(Src) 36.7 C (98.1 F) (Temporal)  Wt 81.738 kg (180 lb 3.2 oz)  LMP 05/06/1983  .General Appearance: healthy, alert, no distress, pleasant affect, cooperative.  Heart:  normal rate and regular rhythm, no murmurs, clicks, or gallops.  Lungs: clear to auscultation.  Extremities:  no cyanosis, clubbing, or edema.  Mental Status: appropriate affect, behavior and mentation     Assessment and Plan:  (E11.9) Type 2 diabetes mellitus without complication  (primary encounter diagnosis)  Comment: last A1c closer to 7; patient reports slight vaginal itch and would like to try Diflucan   Plan: Fluconazole (DIFLUCAN) 150 mg Tablet, Metformin        (GLUCOPHAGE) 1,000 mg tablet          (I10) Essential hypertension  Comment: at goal while on ARB   Plan: to monitor     (F41.8) Depression with anxiety  Comment: patient seems to see a benefit and would like to try 20 mg dose   Plan: Escitalopram (LEXAPRO) 20 mg tablet          (E11.29) Type 2 diabetes mellitus with other diabetic kidney complication  Comment: D1V closer to 7; seeing endo--we appreciate the great care   Plan: canagliflozin (INVOKANA) 100 mg Tablet,         Losartan (COZAAR) 25 mg Tablet              Total encounter time including history, physical examination, and coordination of care was approximately 20 minutes of which more than 50% was spent counseling regarding assessment/diagnosis and treatment plan regarding DM, cough, mood--patient verbalized understanding. No guarantees were made regarding her medical care or treatment outcome. Barriers  to Learning: none.  Patient verbalizes understanding of  teaching and instructions.    Electronically signed by:    [name], MD  [place] [facility] of [facility]  Associate physician [facility] [place], [place]   [phone]

## 2014-06-15 ENCOUNTER — Other Ambulatory Visit: Payer: Self-pay

## 2014-06-29 ENCOUNTER — Other Ambulatory Visit: Payer: Self-pay

## 2014-07-20 ENCOUNTER — Telehealth: Payer: Self-pay | Admitting: Family Medicine

## 2014-07-20 DIAGNOSIS — N281 Cyst of kidney, acquired: Secondary | ICD-10-CM

## 2014-07-20 NOTE — Telephone Encounter (Signed)
Patient is requesting an order for her 6 month follow up Ultrasound of Kidney. Can you please order and notify patient so she can be scheduled?

## 2014-07-20 NOTE — Telephone Encounter (Signed)
ultrasound of the abdomen ordered to follow up on cyst of the kidney. Please, help patient schedule. Betsey Amen, MD

## 2014-07-20 NOTE — Telephone Encounter (Signed)
Patient notified and scheduled 

## 2014-07-24 ENCOUNTER — Encounter: Payer: Self-pay | Admitting: Family Medicine

## 2014-07-24 ENCOUNTER — Ambulatory Visit: Payer: Medicare Other | Admitting: Family Medicine

## 2014-07-24 VITALS — BP 126/76 | HR 66 | Temp 97.9°F | Wt 179.2 lb

## 2014-07-24 DIAGNOSIS — E1129 Type 2 diabetes mellitus with other diabetic kidney complication: Secondary | ICD-10-CM

## 2014-07-24 DIAGNOSIS — M79672 Pain in left foot: Secondary | ICD-10-CM

## 2014-07-24 DIAGNOSIS — M797 Fibromyalgia: Secondary | ICD-10-CM

## 2014-07-24 NOTE — Nursing Note (Signed)
Vital signs taken, allergies and pharmacy verified. Screened for pain.

## 2014-07-24 NOTE — Patient Instructions (Signed)
Naproxen 500 mg every 12 hrs as needed with food.  Call or return to clinic if symptoms worsen, fail to improve  or other symptoms develop.

## 2014-07-24 NOTE — Progress Notes (Signed)
Chief Complaint   Patient presents with    Foot Problem     Lump on left heel        Paula Daugherty is a 69yrold female patient with multiple medical problems here due to 3 day history of moderate and localized left posterior heel pain associated with redness and swelling; no known trauma; patient reports feels like a gouty flare-up; nothing makes her symptoms better; nothing makes her symptoms worse.    Review of Systems -   no fever or chills  Psych; leaving to be her mother after passing of her father--possible delirium for mother   FSBS of 130 this AM     Patient Active Problem List   Diagnosis    DM2 (diabetes mellitus, type 2)    Depression with anxiety    Stress at home    Leg cramps-right calf    Right ear pain    Fibromyalgia    HTN (hypertension)    GERD (gastroesophageal reflux disease)    Hyperlipidemia with target LDL less than 70    Vitamin D insufficiency    Abnormal LFTs    Renal cyst ( 6.4 cm cyst left kidney)    Cough    Type 2 diabetes mellitus without complication     History     Social History    Marital Status: MARRIED     Spouse Name: N/A     Number of Children: 1    Years of Education: N/A     Occupational History    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      Social History Main Topics    Smoking status: Former Smoker -- 0.10 packs/day for 20 years    Smokeless tobacco: Never Used    Alcohol Use: 0.0 oz/week     0 Not specified per week      Comment: rare shares a beer or glass wine     Drug Use: No    Sexual Activity:     Partners: Male     Other Topics Concern    Not on file     Social History Narrative     Family History   Problem Relation Age of Onset    Diabetes Father     Heart Mother      MI at 638s    Non-contributory Brother     Non-contributory Brother        OBJECTIVE:  BP 126/76 mmHg  Pulse 66  Temp(Src) 36.6 C (97.9 F) (Temporal)  Wt 81.285 kg (179 lb 3.2 oz)  LMP 05/06/1983  General Appearance: healthy, alert, no distress, pleasant affect,  cooperative.  Extremities:  no cyanosis, clubbing, or edema.  Skin:  Left foot: nomass.  Skin color, texture, turgor normal. No rashes or lesions.  Neuro: left foot seems NVI.  Musculoskeletal: left posterior heel with small localized area of diffused erythema and soft tissue swelling without ecchymosis; no fluctuant; not endurated     ASSESSMENT:  ((K09.381 Heel pain, left  (primary encounter diagnosis)  Comment: new x days; history of gout with pedagra; seems like a possible flareup   Plan: increase fluids; on Losartan--not on HCTZ; patient reports has Naproxen 500 for BID prn with food.  Call or return to clinic if symptoms worsen, fail to improve  or other symptoms develop.  We check uric acid with next lab.     (E11.29) Type 2 diabetes mellitus with other diabetic kidney complication  Comment: stable  Plan: to monitor     (M79.7) Fibromyalgia  Comment: stable; likling Lexapro benefits   Plan: to monitor     Total encounter time including history, physical examination, and coordination of care was approximately 15 minutes of which more than 50% was spent counseling regarding assessment/diagnosis and treatment plan regarding pain, DM abd fibromyalgia--patient verbalized understanding. No guarantees were made regarding her medical care or treatment outcome. Barriers to Learning: none.  Patient verbalizes understanding of teaching and instructions.    Electronically signed by:    [name], MD  [place], [place] Board of Family Medicine  Associate physician [facility], [place]   [phone]

## 2014-08-09 ENCOUNTER — Encounter: Payer: Self-pay | Admitting: Ophthalmology

## 2014-08-09 ENCOUNTER — Other Ambulatory Visit: Payer: Self-pay

## 2014-08-09 ENCOUNTER — Other Ambulatory Visit: Payer: Self-pay | Admitting: Family Medicine

## 2014-08-09 NOTE — Telephone Encounter (Signed)
Patient last seen by PCP on  07/24/14

## 2014-08-17 ENCOUNTER — Other Ambulatory Visit: Payer: Self-pay | Admitting: Family Medicine

## 2014-08-17 DIAGNOSIS — R05 Cough: Principal | ICD-10-CM

## 2014-08-17 DIAGNOSIS — R059 Cough, unspecified: Secondary | ICD-10-CM

## 2014-08-17 MED ORDER — BECLOMETHASONE DIPROPIONATE 80 MCG/ACTUATION AEROSOL INHALER
1.0000 | INHALATION_SPRAY | Freq: Two times a day (BID) | RESPIRATORY_TRACT | Status: AC
Start: 2014-08-17 — End: 2014-11-26

## 2014-08-22 ENCOUNTER — Inpatient Hospital Stay: Admit: 2014-08-22 | Payer: Self-pay

## 2014-08-22 ENCOUNTER — Ambulatory Visit
Admission: RE | Admit: 2014-08-22 | Discharge: 2014-08-22 | Disposition: A | Discharge status: Home / Self Care | Payer: Medicare Other | Source: Ambulatory Visit | Attending: Family Medicine | Admitting: Family Medicine

## 2014-08-22 ENCOUNTER — Ambulatory Visit: Payer: Medicare Other | Admitting: "Endocrinology

## 2014-08-22 ENCOUNTER — Ambulatory Visit (INDEPENDENT_AMBULATORY_CARE_PROVIDER_SITE_OTHER)
Admission: RE | Admit: 2014-08-22 | Discharge: 2014-08-22 | Disposition: A | Discharge status: Home / Self Care | Payer: Medicare Other | Source: Ambulatory Visit | Attending: Family Medicine | Admitting: Family Medicine

## 2014-08-22 ENCOUNTER — Ambulatory Visit (INDEPENDENT_AMBULATORY_CARE_PROVIDER_SITE_OTHER): Payer: Medicare Other

## 2014-08-22 VITALS — BP 133/70 | HR 67 | Temp 98.6°F | Wt 180.0 lb

## 2014-08-22 DIAGNOSIS — E1129 Type 2 diabetes mellitus with other diabetic kidney complication: Secondary | ICD-10-CM

## 2014-08-22 DIAGNOSIS — R160 Hepatomegaly, not elsewhere classified: Secondary | ICD-10-CM

## 2014-08-22 DIAGNOSIS — M79672 Pain in left foot: Secondary | ICD-10-CM

## 2014-08-22 DIAGNOSIS — Z1231 Encounter for screening mammogram for malignant neoplasm of breast: Secondary | ICD-10-CM

## 2014-08-22 DIAGNOSIS — E1165 Type 2 diabetes mellitus with hyperglycemia: Secondary | ICD-10-CM

## 2014-08-22 DIAGNOSIS — E119 Type 2 diabetes mellitus without complications: Secondary | ICD-10-CM | POA: Insufficient documentation

## 2014-08-22 DIAGNOSIS — Q6100 Congenital renal cyst, unspecified: Secondary | ICD-10-CM

## 2014-08-22 DIAGNOSIS — N281 Cyst of kidney, acquired: Secondary | ICD-10-CM

## 2014-08-22 LAB — COMPREHENSIVE METABOLIC PANEL
Alanine Transferase (ALT): 38 U/L (ref 5–54)
Albumin: 3.9 g/dL (ref 3.2–4.6)
Alkaline Phosphatase (ALP): 97 U/L (ref 35–115)
Aspartate Transaminase (AST): 40 U/L (ref 15–43)
Bilirubin Total: 0.8 mg/dL (ref 0.3–1.3)
Calcium: 9.5 mg/dL (ref 8.6–10.5)
Carbon Dioxide Total: 25 mEq/L (ref 24–32)
Chloride: 103 mEq/L (ref 95–110)
Creatinine Blood: 0.82 mg/dL (ref 0.44–1.27)
E-GFR, African American: >60 SEE NOTE (ref >60)
E-GFR, Non-African American: >60 SEE NOTE (ref >60)
Glucose: 109 mg/dL — ABNORMAL HIGH (ref 70–99)
Potassium: 4.2 mEq/L (ref 3.3–5.0)
Protein: 7.7 g/dL (ref 6.3–8.3)
Sodium: 141 mEq/L (ref 135–145)
Urea Nitrogen, Blood (BUN): 17 mg/dL (ref 8–22)

## 2014-08-22 LAB — LIPID PANEL
Cholesterol: 165 mg/dL (ref 0–200)
HDL Cholesterol: 51 mg/dL (ref >=35)
LDL Cholesterol Calculation: 87 mg/dL (ref <130)
Non-HDL Cholesterol: 114 mg/dL (ref 0–150)
Total Cholesterol:HDL Ratio: 3.2 (ref <4.0)
Triglyceride: 134 mg/dL (ref 35–160)

## 2014-08-22 LAB — URIC ACID, BLOOD: Uric Acid, Blood: 5.7 mg/dL (ref 2.2–7.7)

## 2014-08-22 MED ORDER — GLIPIZIDE ER 5 MG TABLET, EXTENDED RELEASE 24 HR
5.0000 mg | EXTENDED_RELEASE_CAPSULE | Freq: Every day | ORAL | Status: DC
Start: 2014-08-22 — End: 2014-12-19

## 2014-08-22 NOTE — Progress Notes (Signed)
Endocrinology Follow up clinic note:    Chief Complaint:  "I'm here to follow up on my diabetes."    HPI: Paula Daugherty is a 69yrold female who has a diagnosis of type 2 diabetes mellitus who presents to Endocrinology for follow up. Her history is as follows:  She was initially diagnosed with diabetes about 15 years ago on routine labs. She has a strong family history of diabetes so she wasn't surprised at this. She was started on Metformin around 2005 and then she was started on insulin in 2014. She was up to 64 units of Lantus at night. She was taken off insulin a few months ago due to concerns about injections. Since this time, she has been monitoring her blood glucose and she states that on a routine basis, her blood glucose is >130 most of the time. She states that she is not very compliant with a diabetic diet and believes that this is a major contributor to why her glycemic control is not at goal.     She also has a history of an elevated alk phos level. A GGT level was checked and was found to be elevated. A bone specific alkaline phosphatase was also found to be elevated. PTH level was normal.      Interim History: She returns today for follow up. Her last visit with Endocrinology was on 05/23/2014. At this visit, no changes were made to her diabetes regimen and she was encouraged to stick to a carbohydrate controlled diet and to increase the monitoring of her blood glucose to at least 2x/day. Since this time, she thinks that things have been going well. She did have a dramatic nightmare the other night which she is concerned may be due to her diabetes, however, she has only had 1 low blood glucose reading since her last visit.     Current regimen:                Glipizide XL 10 mg daily              Metformin 1000 mg BID              Invokana 100 mg daily  Hypoglycemia: Yes, will decrease to the 50s if she misses meals              Treatment: Juice or soft drink              Awareness: Yes  <80  Hyperglycemia: Improved overall    Meter brought today: No  Checks blood glucose: 1-2x/day (usually in the morning, fasting)    Home blood glucose log brought to clinic today: No  Home blood glucose numbers per pt's memory:    Blood Glucose:                Pre-breakfast: 96 to 169  Last eye exam: 04/2014, no retinopathy, f/u 1 year  Foot exam: Will do today     ROS:  All other systems were reviewed and are negatative except for pertinent positive and negative responses as documented in HPI.     Medications:  Medication reconciliation was performed today.   Outpatient Prescriptions Marked as Taking for the 08/22/14 encounter (Office Visit) with DCharlane Ferretti MD   Medication Sig Dispense Refill    Albuterol (PROAIR HFA, PROVENTIL HFA, VENTOLIN HFA) 90 mcg/actuation inhaler Take 1-2 puffs by inhalation every 4 hours if needed. 1 inhaler 1    Amlodipine (NORVASC) 10 mg Tablet Take 1  tablet by mouth every day. 90 tablet 1    Aspirin 81 mg DR Tablet EC Tablet Take 1 tablet by mouth every day. 30 tablet 5    Atorvastatin (LIPITOR) 10 mg Tablet Take 1 tablet by mouth every day at bedtime. 90 tablet 3    Beclomethasone (QVAR) 80 mcg/actuation Inhaler Take 1 puff by inhalation 2 times daily. rinse mouth after each use 8.7 g 1    Blood Sugar Diagnostic (ONETOUCH ULTRA TEST) Strips Use for testing blood glucose 3 times per day 300 strip 3    canagliflozin (INVOKANA) 100 mg Tablet Take 1 tablet by mouth every day. 90 tablet 3    Cholecalciferol, Vitamin D3, (VITAMIN D-3) 2,000 unit Tablet Take 1 tablet by mouth every day.      Escitalopram (LEXAPRO) 20 mg tablet Take 1 tablet by mouth every day. 90 tablet 3    Fexofenadine (ALLEGRA) 180 mg tablet Take 1 tablet by mouth every day. 30 tablet 11    Fluticasone (FLONASE) 50 mcg/actuation nasal spray Instill 2 sprays into EACH nostril every day. 16 g 1    GlipiZIDE (GLUCOTROL XL) 10 mg SR Tablet Take 1 tablet by mouth every morning with a meal. (diabetes) 30  tablet 5    Losartan (COZAAR) 25 mg Tablet Take 1 tablet by mouth every day. 90 tablet 3    Metformin (GLUCOPHAGE) 1,000 mg tablet Take 1 tablet by mouth 2 times daily with meals. (diabetes) 180 tablet 3    Omeprazole (PRILOSEC) 20 mg Delayed Release Capsule TAKE ONE CAPSULE BY MOUTH EVERY DAY 90 capsule 0       I did review patient's past medical and family/social history, no changes noted.   PMH:  Past medical history was reviewed from problem list.   Patient Active Problem List   Diagnosis    DM2 (diabetes mellitus, type 2)    Depression with anxiety    Stress at home    Leg cramps-right calf    Right ear pain    Fibromyalgia    HTN (hypertension)    GERD (gastroesophageal reflux disease)    Hyperlipidemia with target LDL less than 70    Vitamin D insufficiency    Abnormal LFTs    Renal cyst ( 6.4 cm cyst left kidney)    Cough    Type 2 diabetes mellitus without complication     VITAL SIGNS:  BP 133/70 mmHg  Pulse 67  Temp(Src) 37 C (98.6 F) (Tympanic)  Wt 81.647 kg (180 lb)  SpO2 97%  LMP 05/06/1983  Body mass index is 32.91 kg/(m^2).    PHYSICAL EXAM:  General Appearance: healthy, alert, no distress, pleasant affect, cooperative.  Eyes:  conjunctivae and corneas clear. PERRL, EOM's intact. sclerae normal.  Mouth: normal.  Neck:  Neck supple. No adenopathy, thyroid symmetric, normal size.  Heart:  normal rate and regular rhythm, no murmurs, clicks, or gallops.  Lungs: clear to auscultation.  Abdomen: BS normal.  Abdomen soft, non-tender.  No masses or organomegaly.  Extremities:  no cyanosis, clubbing, or edema.  Foot Exam: normal DP and PT pulses and no trophic changes or ulcerative lesions. Normal sensation to monofilament exam bilaterally  Skin:  Skin color, texture, turgor normal. No rashes or lesions.  Neuro: Gait normal. No tremor appreciated. Sensation and strength grossly normal.  Mental Status: Appearance/Cooperation: in no apparent distress and well developed and well  nourished  Eye Contact: normal  Speech: normal volume, rate, and pitch    LAB TESTS/STUDIES:  I personally reviewed the following laboratory studies.     04/10/2014 15:05 06/07/2014 09:37   SODIUM 139 142   POTASSIUM 3.6 3.8   CHLORIDE 101 102   CARBON DIOXIDE TOTAL 27 30   UREA NITROGEN, BLOOD (BUN) 29 (H) 14   CREATININE BLOOD 1.21 0.85   E-GFR, AFRICAN AMERICAN 54 (L) >60   E-GFR, NON-AFRICAN AMERICAN 45 (L) >60   GLUCOSE 116 (H) 118 (H)   CALCIUM 9.8 9.5   PROTEIN 7.8    ALBUMIN 3.9    ALKALINE PHOSPHATASE (ALP) 108    ASPARTATE TRANSAMINASE (AST) 57 (H)    BILIRUBIN TOTAL 1.0    ALANINE TRANSFERASE (ALT) 62 (H)    HGB A1C 7.6 (H)    HGB A1C,GLUCOSE EST AVG 171      Lab Results   Lab Name Value Date/Time    CHOL 165 08/22/2014 08:15 AM    LDLC 87 08/22/2014 08:15 AM    HDL 51 08/22/2014 08:15 AM    TRIG 134 08/22/2014 08:15 AM      12/14/2013 14:55   WHITE BLOOD CELL COUNT 10.9   HEMOGLOBIN 13.8   HEMATOCRIT 42.6   MCV 85.8   RDW 14.0   PLATELET COUNT 284      12/08/2013 10:30 02/21/2014 13:52   CREATININE SPOT URINE 225.81 530.26   MICROALBUMIN URINE 13.4 176.4   MICROALBUMIN/CREATININE RATIO 59 (H) 333 (H)      12/08/2013 10:30   GAMMA GLUTAMYL TRANSFERASE(GGT 96 (H)     Impression: This is a 69yrold femalewith Diabetes Mellitus Type II who presents to Endocrinology for follow up. Based on her reported blood glucose, her glycemic control has improved dramatically since her last visit and because of this, I have recommended that she decrease her glipizide XR dose from 10 mg daily to 5 mg daily. She was encouraged to continue close monitoring of her blood glucose (goal to increase to checking ~2x/day) and to report any hypoglycemia or persistent hyperglycemia prior to her follow up appointment.   She also has a history of an elevated alk phos level. GGT was elevated which suggests a liver source and she was found to have an enlarged liver with increased echogenicity suggestive of fatty infiltration on abdominal  ultrasound in 12/2013. Her last alk phos 04/2014 had improved and she will repeat a CMP now.     DIABETES HISTORY  (-) h/o retinopathy.     (-) h/o microalbuminuria/nephropathy.  (-) h/o neuropathy.  (-) h/o Autonomic dysfunction  (-) h/o Gastroparesis  (-) h/o CAD  (-) h/o PVD   Last eye exam: 04/2014, f/u 1 year   Last urine microalbumin: 02/2014,333  Flu vaccination: 02/2014  (+) ASA  (+) Statin  (+) ACE/ARB         Recommendations:  (E11.29) Type 2 diabetes mellitus with other diabetic kidney complication  (primary encounter diagnosis)  - No changes made to metformin and Invokana doses today  - Decrease glipizide XR to 5 mg daily  - Patient was encouraged to report any hypoglycemia or persistent hyperglycemia prior to her follow up appointment  - Encouraged patient to continue close monitoring of blood glucose - goal at least 2x/day  - Last LDL at goal, continue statin, repeat fasting lipid panel due now  - Blood pressure at goal, continue amlodipine, cozaar    - Last microalbumin:creatinine ratio elevated, continue cozaar  - Up to date on influenza vaccine  - Up to date on screening eye exam, next  due 04/2015    Approximately 25 minutes were spent with patient, greater than 50% of which was spent counseling the patient on diabetes and on coordination of care.     Follow up in 3 months    If you have any questions, please do not hesitate to contact me at [phone].  Thank you for allowing me to participate in the care of this patient.    EDUCATION:  I educated/instructed the patient or caregiver regarding all aspects of the above stated plan of care.  The patient or caregiver indicated understanding.      [place] interpreter was not used.    Report electronically signed by:  Sunday Spillers, M.D.  Clinical Assistant Professor  Department of Endocrinology  Pager # [phone]

## 2014-08-22 NOTE — Patient Instructions (Signed)
1. No changes to Invokana or Metformin    2. Decrease glipizide to 5 mg daily    3. Please have A1c blood test done before follow up appointment    4. Return to clinic in ~ 3 months

## 2014-08-22 NOTE — Nursing Note (Signed)
Vital signs taken, allergies verified, screened for pain, and pharmacy verified.  Cory O'Dell,MA

## 2014-08-23 ENCOUNTER — Ambulatory Visit: Payer: Self-pay

## 2014-08-23 LAB — HEMOGLOBIN A1C
Hgb A1C,Glucose Est Avg: 146 mg/dL
Hgb A1C: 6.7 % — ABNORMAL HIGH (ref 3.9–5.6)

## 2014-08-28 ENCOUNTER — Encounter: Payer: Self-pay | Admitting: Family Medicine

## 2014-08-28 DIAGNOSIS — K760 Fatty (change of) liver, not elsewhere classified: Secondary | ICD-10-CM | POA: Insufficient documentation

## 2014-08-28 DIAGNOSIS — K769 Liver disease, unspecified: Secondary | ICD-10-CM | POA: Insufficient documentation

## 2014-08-28 NOTE — Telephone Encounter (Signed)
From: [name]  To: [name], MD  Sent: 08/27/2014 6:01 PM PDT  Subject: Non-urgent Medical Advice Question    Hi Dr [name]. Please go ahead with the referral about my liver. I have to keep going back and forth to [place] and would like to get this going. Thanks

## 2014-08-29 ENCOUNTER — Encounter: Payer: Self-pay | Admitting: Family Medicine

## 2014-08-29 NOTE — Telephone Encounter (Signed)
From: [name]  To: [name], MD  Sent: 08/29/2014 1:13 PM PDT  Subject: [place] Advice Question    Hi doc, my gout has flared up again. In [place] the Rx was Colcrys 0.6 MG qd. Since you have seen me for the heel gout would you be willing to order something for me here. It is still my left foot but at the big toe joint. Thank you, Paula Daugherty

## 2014-08-30 ENCOUNTER — Other Ambulatory Visit: Payer: Self-pay | Admitting: Diagnostic Radiology

## 2014-09-06 ENCOUNTER — Ambulatory Visit: Payer: Medicare Other | Admitting: Family Medicine

## 2014-09-06 ENCOUNTER — Encounter: Payer: Self-pay | Admitting: Family Medicine

## 2014-09-06 ENCOUNTER — Other Ambulatory Visit: Payer: Self-pay

## 2014-09-06 VITALS — BP 142/84 | HR 71 | Temp 97.7°F | Wt 179.2 lb

## 2014-09-06 DIAGNOSIS — Z638 Other specified problems related to primary support group: Secondary | ICD-10-CM

## 2014-09-06 DIAGNOSIS — K760 Fatty (change of) liver, not elsewhere classified: Secondary | ICD-10-CM

## 2014-09-06 DIAGNOSIS — N281 Cyst of kidney, acquired: Secondary | ICD-10-CM

## 2014-09-06 DIAGNOSIS — F439 Reaction to severe stress, unspecified: Secondary | ICD-10-CM

## 2014-09-06 DIAGNOSIS — I10 Essential (primary) hypertension: Secondary | ICD-10-CM

## 2014-09-06 DIAGNOSIS — F418 Other specified anxiety disorders: Secondary | ICD-10-CM

## 2014-09-06 DIAGNOSIS — E1129 Type 2 diabetes mellitus with other diabetic kidney complication: Secondary | ICD-10-CM

## 2014-09-06 NOTE — Progress Notes (Signed)
Health Management and Education    PCP referral for [place] Coordinator. Pertinent parts of chart reviewed. Patient referred to [place], phone [phone] for Disease, Medication, Psychosocial/resource issues. Assistance with depression management, [id number], home related stress.    MHI: Cardiovascular risk factors addressed and barriers noted.  Remaining risk factors include: - Blood pressure > 140/80 at last visit. SM Verlene Mayer.    [facility] Education  Phone: [phone]

## 2014-09-06 NOTE — Nursing Note (Signed)
Vital signs taken, allergies and pharmacy verified. Screened for pain.

## 2014-09-06 NOTE — Progress Notes (Signed)
Paula Daugherty is a 17yrhere for follow-up with PCP  Chief Complaint   Patient presents with    Other     f/u      1. Fatty liver: per ultrasound; possible portal hypertension; patient amenable to see hepatologist as we continue on Metformin and working on weight loss  2. Kidney cyst: possibly stable; patient aware we can monitor with ultrasound   3. DM2: doing very well; no concerns   4. Stress at home; stepson with recent toe amputation seeming to keep his distance while staying in her home; mother, who is a recent widow, requiring more of her help [place]      ROS:   .Constitutional: no fever/chills.  CV: no chest pain, palpitations, shortness of breath or edema    Patient Active Problem List   Diagnosis    DM2 (diabetes mellitus, type 2)    Depression with anxiety    Stress at home    Leg cramps-right calf    Right ear pain    Fibromyalgia    HTN (hypertension)    GERD (gastroesophageal reflux disease)    Hyperlipidemia with target LDL less than 70    Vitamin D insufficiency    Abnormal LFTs    Renal cyst ( 6.4 cm cyst left kidney)    Cough    Type 2 diabetes mellitus without complication    Fatty liver       Current Outpatient Prescriptions on File Prior to Visit   Medication Sig Dispense Refill    Albuterol (PROAIR HFA, PROVENTIL HFA, VENTOLIN HFA) 90 mcg/actuation inhaler Take 1-2 puffs by inhalation every 4 hours if needed. 1 inhaler 1    Aspirin 81 mg DR Tablet EC Tablet Take 1 tablet by mouth every day. 30 tablet 5    Atorvastatin (LIPITOR) 10 mg Tablet Take 1 tablet by mouth every day at bedtime. 90 tablet 3    Beclomethasone (QVAR) 80 mcg/actuation Inhaler Take 1 puff by inhalation 2 times daily. rinse mouth after each use 8.7 g 1    Blood Sugar Diagnostic (ONETOUCH ULTRA TEST) Strips Use for testing blood glucose 3 times per day 300 strip 3    CALCIUM-MAGNESIUM-ZINC PO       canagliflozin (INVOKANA) 100 mg Tablet Take 1 tablet by mouth every day. 90 tablet 3     Cholecalciferol, Vitamin D3, (VITAMIN D-3) 2,000 unit Tablet Take 1 tablet by mouth every day.      Escitalopram (LEXAPRO) 20 mg tablet Take 1 tablet by mouth every day. 90 tablet 3    Fexofenadine (ALLEGRA) 180 mg tablet Take 1 tablet by mouth every day. 30 tablet 11    Fluconazole (DIFLUCAN) 150 mg Tablet Take 1 tablet by mouth one time. May repeat after 3 days 2 tablet 0    Fluticasone (FLONASE) 50 mcg/actuation nasal spray Instill 2 sprays into EACH nostril every day. 16 g 1    GlipiZIDE (GLUCOTROL XL) 5 mg SR Tablet Take 1 tablet by mouth every morning with a meal. 30 tablet 11    Inhalational Spacing Device (AEROCHAMBER/FLOWSIGNAL) Spacer To use with inhaler as directed 1 each 1    Losartan (COZAAR) 25 mg Tablet Take 1 tablet by mouth every day. 90 tablet 3    Metformin (GLUCOPHAGE) 1,000 mg tablet Take 1 tablet by mouth 2 times daily with meals. (diabetes) 180 tablet 3    Omeprazole (PRILOSEC) 20 mg Delayed Release Capsule TAKE ONE CAPSULE BY MOUTH EVERY DAY  90 capsule 0    Oxybutynin (DITROPAN) 5 mg Tablet Take 5 mg by mouth 3 times daily.       No current facility-administered medications on file prior to visit.     Allergies:   Allergies   Allergen Reactions    Contrast Dye [Radiopaque Agent] Hives    Hydrochlorothiazide Other-Reaction in Comments     Patient reports    Januvia [Sitagliptin] Other-Reaction in Comments     Patient reports    Lyrica [Pregabalin] Other-Reaction in Comments     Patient reports    Omnipaque [Iohexol] Other-Reaction in Comments     Patient reports    Prozac [Fluoxetine Hcl] Other-Reaction in Comments     Patient reports    Sulfa (Sulfonamide Antibiotics) Rash    Topiramate Other-Reaction in Comments     Patient reports       History     Social History    Marital Status: MARRIED     Spouse Name: N/A    Number of Children: 1    Years of Education: N/A     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[employer]       Social History Main Topics    Smoking  status: Former Smoker -- 0.10 packs/day for 20 years    Smokeless tobacco: Never Used    Alcohol Use: 0.0 oz/week     0 Standard drinks or equivalent per week      Comment: rare shares a beer or glass wine     Drug Use: No    Sexual Activity:     Partners: Male     Other Topics Concern    None     Social History Narrative     Family History   Problem Relation Age of Onset    Diabetes Father     Heart Mother      MI at 87s     Non-contributory Brother     Non-contributory Brother        PE:  BP 142/84 mmHg  Pulse 71  Temp(Src) 36.5 C (97.7 F) (Temporal)  Wt 81.285 kg (179 lb 3.2 oz)  LMP 05/06/1983  .General Appearance: healthy, alert, no distress, pleasant affect, cooperative.  Heart:  normal rate and regular rhythm, no murmurs, clicks, or gallops.  Lungs: clear to auscultation.  Extremities:  no cyanosis, clubbing, or edema.  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  (primary encounter diagnosis)  Comment: at goal   Plan: to monitor; we appreciate Endo also participating her care     ([place]) Essential hypertension  Comment:close to goal; on ARB  Plan: to monitor     (K76.0) Fatty liver  Comment: per ultrasound   Plan: patient to see hepatology due to questionable portal hypertension     (Q61.00) Renal cyst ( 6.4 cm cyst left kidney)  Comment: per recent ultrasound; possibly stable   Plan: to monitor     (F41.8) Depression with anxiety  Comment: on Lexapro; situational issues with step son staying with them until recovers to go back to [place] and mother, as recent widow, requiring more of her care and lives out of state   Plan: [place]          (Z63.8) Stress at home  Comment: same  Plan: [place]  Total encounter time including history, physical examination, and coordination of care was approximately 20 minutes of which more  than 50% was spent counseling regarding assessment/diagnosis and treatment plan regarding stressors and need for possible counselor and/or support grp for possible fibromyalgia and increased activity such as swimming--patient verbalized understanding. No guarantees were made regarding her medical care or treatment outcome. Barriers to Learning: none.  Patient verbalizes understanding of teaching and instructions.    Electronically signed by:    [name], MD  [place], [place] Board of Family Medicine  Associate physician [facility], [place]   [phone]

## 2014-09-06 NOTE — Progress Notes (Signed)
Health Management and Education Pharmacist Note    Pt referred to HME-Pharmacist for review of cardiovascular risks as part of [employer].  Current risks include -    - Blood pressure > 140/80 at last visit - BP trend reviewed.  All recent clinic reading have been under 140/80 and only last clinic visit at 142/80.  Elevation noted by PMD and will monitor.  Pt currently on low dose ARB with room to increase if BP remains elevated.    No intervention at this time.    Verlene Mayer, [place].D, BCPS  Pharmacist Case Manager  Health Management & Education  [phone]

## 2014-09-07 ENCOUNTER — Other Ambulatory Visit: Payer: Self-pay

## 2014-09-07 NOTE — Progress Notes (Signed)
Health Management and Education      Reviewed the pertinent portion of patients chart. Called and left voice mail message for Patient, requesting return call to schedule phone appointment next week. Awaiting Call back.       Electronically signed by   Ilanna Deihl, LCSW  Social Work, Care Manager   Health Management and Education  [phone]

## 2014-09-11 ENCOUNTER — Other Ambulatory Visit: Payer: Self-pay

## 2014-09-11 NOTE — Progress Notes (Signed)
Health Management and Education  Care Coordination Telephonic Encounter    Care Coordination referral received.  Pertinent portions of chart have been reviewed.  Chart routed to [place], Rich Brave, for assistance with outreach efforts.    Electronically signed by,  Keturah Barre, [place] Management [facility] Education  [phone]

## 2014-09-11 NOTE — Progress Notes (Addendum)
Health Management and Education  Health Coach Telephonic Encounter    Spoke with Paula Daugherty on 09/11/2014 to for initial Care Coordination outreach.  3 patient identifiers used    Provided introduction and program description. Obtained patient verbal agreement to participate in program. Follow up scheduled.     Patient's concerns/needs/requests for Care Coordination assistance:   Caregiver support group   Open to therapy    Patient preferred days/times for outreach: 8 am - 12 pm usually better, next week best days to call May 18th and 19th in the afternoon.  Best Phone number to reach patient verified as: [phone] cell   Mailing address verified as: correct    Patient has been provided with assigned Social Work Care Manager's contact information and advised that Education officer, museum will be in contact within a week.    Resources/Referrals provided to patient by HME-Health Coach: Patient has been enrolled in Stress and Pain management classes    Chart note routed to assigned Care Manager, Keturah Barre, for further outreach & follow-up.    Electronically signed by:  [place] [facility] Education  [phone]

## 2014-09-12 ENCOUNTER — Ambulatory Visit: Payer: Medicare Other | Admitting: Internal Medicine

## 2014-09-12 VITALS — BP 114/68 | HR 76 | Temp 96.9°F | Resp 12 | Ht 62.0 in | Wt 180.9 lb

## 2014-09-12 DIAGNOSIS — N181 Chronic kidney disease, stage 1: Secondary | ICD-10-CM

## 2014-09-12 DIAGNOSIS — N281 Cyst of kidney, acquired: Secondary | ICD-10-CM

## 2014-09-12 DIAGNOSIS — E785 Hyperlipidemia, unspecified: Secondary | ICD-10-CM

## 2014-09-12 DIAGNOSIS — E1121 Type 2 diabetes mellitus with diabetic nephropathy: Secondary | ICD-10-CM

## 2014-09-12 DIAGNOSIS — I129 Hypertensive chronic kidney disease with stage 1 through stage 4 chronic kidney disease, or unspecified chronic kidney disease: Secondary | ICD-10-CM

## 2014-09-12 DIAGNOSIS — K760 Fatty (change of) liver, not elsewhere classified: Secondary | ICD-10-CM

## 2014-09-12 NOTE — Progress Notes (Signed)
NEPHROLOGY CONSULT NOTE    Reason For Referral:  renal cyst, macroalbuminuria. Patient has this confusion as she feels she was supposed to see Hepatology today for fatty liver.     Identification: Paula Daugherty is 69 yo WF who comes today to my clinic for evaluation of renal cyst and macroalbuminuria.     Chief Complaint: none    History of Present Illness :     I had the pleasure today of seeing your patient, Paula Daugherty, in the Nephrology Clinic at [facility]. As you know Paula Daugherty is 69 yo WF with PMH of Diabetes Mellitus 1 and renal cyst. She was under this impression that she is seeing Hepatologist today for fatty liver seen in abdominal US. She has high LFTs. I reviewed chart and I don't see renal referral in the system. But I went ahead and saw the patient as she is scheduled to see me today and labs review reveal macroalbuminuria and renal cyst.     Baseline Creatinine : 0.8, normal  E GFR range : > 60 ml/min  Urine analysis : has proteinuria, WBC, RBC, recent microalbumin Creatinine ratio was 333 mg/gm  Renal US : not done, abdominal US was done which showed fatty liver and Bosniak 27F cyst, patient has been getting f/u US every 8-10 months  Right kidney: The right kidney measures 10.7 cm in long axis.Diffuse right renal parenchymal thinning.No hydronephrosis.No definite shadowing stones.    Left kidney:The left kidney measures 11.5 cm in long axis.In the left renal superior to mid zone, there is a prominent 6.1 x 4.1 x5.4 cm cyst with at least one incomplete septa (with possible punctate septal calcification) seen. Cyst is otherwise anechoic. With color-flow evaluation, no internal vascularity is detected.  No hydronephrosis.No definite shadowing stones.    UPEP, SPEP was normal   Saw Urologist Dr Alm Bustard in June 2015 for left renal cyst, he recommended MRI, patient never got it as patient has wire prosthesis in the ear. Patient can't get CT scan contrast either as allergic to dye.     Denies CVA,hypertensive  emmergencies or urgencies.   Denies NSAID's use on regular basis, denies OTC meds or herbal products.  Denies any autoimmune diseases,rashes,joint pains,occupational exposures,family history of kidney disease.   Denies history of kindey stones or UTI's.   Denies urinary incontinence,urgencies or hesitancy, denies burning micturition  Denies h/o blood transfusions, tattos.  Denies previous h/o AKI episodes  Denies h/o malignancy.         Past Medical History:    1. Hypertension   2. Diabetes Mellitus 2 : for last 10 years, denies retinopathy, neuropathy  3. Fibromyalgia  4. Depression  5. Hyperlipidemia   6. Renal cysts    PAST SURGICAL HISTORY:    1. Tubal ligation  2. Hysterectomy for benign reasons  3. Knee procedure  4. Breast reduction     ALLERGIES:     Contrast Dye [Radiopaque Agent]    Hives  Hydrochlorothiazide    Other-Reaction in Comments    Comment:Patient reports  Januvia [Sitagliptin]    Other-Reaction in Comments    Comment:Patient reports  Lyrica [Pregabalin]    Other-Reaction in Comments    Comment:Patient reports  Omnipaque [Iohexol]    Other-Reaction in Comments    Comment:Patient reports  Prozac [Fluoxetine Hcl]    Other-Reaction in Comments    Comment:Patient reports  Sulfa (Sulfonamide Antibiotics)    Rash  Topiramate    Other-Reaction in Comments    Comment:Patient reports  MEDICATIONS:    Current Outpatient Prescriptions   Medication Sig Dispense Refill    Albuterol (PROAIR HFA, PROVENTIL HFA, VENTOLIN HFA) 90 mcg/actuation inhaler Take 1-2 puffs by inhalation every 4 hours if needed. 1 inhaler 1    Aspirin 81 mg DR Tablet EC Tablet Take 1 tablet by mouth every day. 30 tablet 5    Atorvastatin (LIPITOR) 10 mg Tablet Take 1 tablet by mouth every day at bedtime. 90 tablet 3    Beclomethasone (QVAR) 80 mcg/actuation Inhaler Take 1 puff by inhalation 2 times daily. rinse mouth after each use 8.7 g 1    Blood Sugar Diagnostic (ONETOUCH ULTRA TEST) Strips Use for testing blood glucose 3  times per day 300 strip 3    CALCIUM-MAGNESIUM-ZINC PO       canagliflozin (INVOKANA) 100 mg Tablet Take 1 tablet by mouth every day. 90 tablet 3    Cholecalciferol, Vitamin D3, (VITAMIN D-3) 2,000 unit Tablet Take 1 tablet by mouth every day.      Escitalopram (LEXAPRO) 20 mg tablet Take 1 tablet by mouth every day. 90 tablet 3    Fexofenadine (ALLEGRA) 180 mg tablet Take 1 tablet by mouth every day. 30 tablet 11    Fluconazole (DIFLUCAN) 150 mg Tablet Take 1 tablet by mouth one time. May repeat after 3 days 2 tablet 0    Fluticasone (FLONASE) 50 mcg/actuation nasal spray Instill 2 sprays into EACH nostril every day. 16 g 1    GlipiZIDE (GLUCOTROL XL) 5 mg SR Tablet Take 1 tablet by mouth every morning with a meal. 30 tablet 11    Inhalational Spacing Device (AEROCHAMBER/FLOWSIGNAL) Spacer To use with inhaler as directed 1 each 1    Losartan (COZAAR) 25 mg Tablet Take 1 tablet by mouth every day. 90 tablet 3    Metformin (GLUCOPHAGE) 1,000 mg tablet Take 1 tablet by mouth 2 times daily with meals. (diabetes) 180 tablet 3    Omeprazole (PRILOSEC) 20 mg Delayed Release Capsule TAKE ONE CAPSULE BY MOUTH EVERY DAY 90 capsule 0    Oxybutynin (DITROPAN) 5 mg Tablet Take 5 mg by mouth 3 times daily.       No current facility-administered medications for this visit.         Social History:   1. Employement status : Lives in [place] from [place]  2. Marital status : married for last 31 yrs, has 3 kids  3. Smoking : quit 25 yrs back , smoked few cigarettes to pack a day for 15 yrs  4. Alcohol active regular use : rarely  5. Recreational drug use : none      Family History:    Denies FH of kidney disease . Hypertension and Diabetes Mellitus 2 runs in family    Review of System:    The following systems are review and are negative except per HPI. Negative for constitutional, ENT, cardiovascular, respiratory, GI, GU, musculoskeletal, skin, neuro, psych, endocrine, heme, allergy, immunology.  "All Systems were reviewed and negative except as listed above".     Physical Exam:    Vitals:  BP 114/68 mmHg  Pulse 76  Temp(Src) 36.1 C (96.9 F) (Tympanic)  Resp 12  Ht 1.575 m (5' 2" )  Wt 82.056 kg (180 lb 14.4 oz)  BMI 33.08 kg/m2  SpO2 95%  LMP 05/06/1983  General:  Pleasant person in no acute distress.   Head, ears, eyes, nose, and throat:  Sclerae anicteric.   Mouth and throat:  No ulcers  appreciated.   Neck:  Supple. No JVD or carotid bruits.   Cardiovascular:   Regular rhythm. No murmur or rub appreciated.   Lungs:  Clear to auscultation bilaterally.  Abdomen:  Soft, nontender, nondistended. Bowel sounds present.  Extremities:  No pretibial edema.   Vascular:  No renal bruits heard. Peripheral pulses intact.   Lymph nodes:  No submandibular, no cervical lymphadenopathy appreciated.   Skin:  No rashes appreciated.   Neurological:  Cranial nerves 2-12 intact. No focal deficits seen.    Laboratory Studies:  as per EMR    Problem List:  1. CKD stage 1  2. Diabetes Mellitus 2  3. Hyperlipidemia   4. Hypertension   5. Left renal cyst Bosniak 13F  6. Macroalbuminuria  7. Fatty liver    Impression and Recommendations:    1. CKD stage 1 : patient has normal Creatinine but with given macroalbuminuria she has CKD stage 1 from diabetic nephropathy  - counseled to avoid NSAIDs and OTC meds, IV contrast    2. Diabetes Mellitus 2 : at goal, counseled to keep goal < 7, continue diet and exercise, continue metformin until Creatinine < 1.3  3. Hyperlipidemia : continue statins  4. Hypertension : controlled with cozaar    5. Left renal cyst Bosniak 13F : patient is getting routine follow up renal US and cyst is stable  Urologist last time advised MRI and patient can't get it with wires prosthesis in ear, can't get CT contrast as allergy to contrast  Patient will call Urology to see if she needs any non contrast study for evaluation or annual rena lUS is fine    6. Macroalbuminuria ; likely from diabetic nephropathy,  SPEP and UPEP normal, denies any autoimmune issues, hepatitis panel negative       RTC : as needed    Total time spent 45 minutes, and  > 50% of the appointment time spent for counseling patient face-to-face  and patient verbalizes the  understanding.    Thank you for allowing me to participate in the patient's care. If you have any further questions, please feel free to call me at  [phone].      Electronically signed by     Heron Sabins, M.D  Associate Professor of Medicine  Division of Nephrology  Department of Internal [facility]  Pager : [phone]  PI : [id number]

## 2014-09-12 NOTE — Nursing Note (Signed)
Patient verified with 2 Identifiers. Vital signs taken. Allergies and pharmacy verified.  screened for pain.  Zurie Platas, LVN

## 2014-09-20 ENCOUNTER — Other Ambulatory Visit: Payer: Self-pay

## 2014-09-20 NOTE — Progress Notes (Signed)
Healthcare Management and Education:     Care Coordination Telephone Follow Up Note; 3 identifiers used     Patient Reports:  Pt reports lots of health problems in the family and psycho social stressors. She states that her "mothers husband" passed away 3 months ago, and her mother has been depressed. Pt states that her mother lives out of state and that handling the affairs, family and also helping her mother has been overwhelming at times. Pt states that she also has other family members with health problems and is trying to find balance in setting limits. Pt states her biggest problem is stress and insomnia. She states she feels like her current medications are working for her but she still cannot sleep due to stressors. Pt states that she is also interested in finding a therapist. Pt denies wanting to harm herself but reports passive thoughts that she wishes she didn't have to deal with it all at times. Pt denies she would ever try to end her life, and reports family as protective factor.     Phone Assessment: Paula Daugherty is a 69 year old female with multiple stressors. She was easy to engage, and reports interest in therapy. Pt is already stressed with feelings of overwhelm at times, so she could benefit from [place] support until she is connected to providers      Care Coordination Goals: decrease sx of depression, increase connection to providers     Barriers:  Medicare is a barrier to mental healthcare     Interventions Provided: Motivational Interviewing, symptom assessment, introduction to cognitive behavioral therapy, support, validation and empathy,strategies for sleep hygine and resources for therapy and grief support.     Plan:   Patient - follow up with PCP, take meds as directed, continue to use strategies discussed and resources mailed.   LCSW - staff with [place], and follow up with pt in 3-4 weeks.       Minutes:  28     Electronically signed by   Keturah Barre, [place] [employer], [place] Management and Education  [phone]

## 2014-09-21 ENCOUNTER — Other Ambulatory Visit: Payer: Self-pay

## 2014-09-21 NOTE — Progress Notes (Addendum)
Health Management and Education Multidisciplinary Rounds Note:       Discussed in multidisciplinary team meeting with Paula Destine MD, Psychiatry,  Paula Matter RN    The following concerns were reviewed: Paula Daugherty is a 61 year retired old female referred to Care Coordination for "Disease, Medication, Psychosocial/resource issues. Assistance with depression management, [id number], home related stress". Pt reports multiple psychosocial stressors, grief, and lots of insomnia. Pts PhQ9 is 15 even with current medications. Pt states that she is interested in finding a therapist that accepts medicare.       Action plan: LCSW to follow up with the pt, provide support, and continue assist with strategies for finding a therapist. CC Psychiatrist to make medication recommendations for PCP consideration.       Electronically signed by   Keturah Barre, [place] [employer] and Education  [phone]    This patient was discussed and care plan was developed with the Care Manager.  I agree with the assessment and plan as outlined in the Care Manager's note.      Given continuing insomnia, may consider adding a sleep-aid agent along with education regarding sleep hygiene.  -Consider Trazodone 25-34m PO QHS prn insomnia.  Please instruct pt to use approx 30-45 min prior to bed, only 4 nights out of the week (patient's choice).  -May consider Bibliotherapy: Feeling Good by Dr. DJudie Petit  -Care Coordination to engage with pt to find therapist.    Report electronically signed by DVenida Jarvis MD. Attending   #[phone] P: [phone]

## 2014-09-22 ENCOUNTER — Encounter: Payer: Self-pay | Admitting: "Endocrinology

## 2014-10-03 ENCOUNTER — Ambulatory Visit: Payer: Medicare Other | Attending: Ophthalmology | Admitting: Ophthalmology

## 2014-10-03 DIAGNOSIS — H02403 Unspecified ptosis of bilateral eyelids: Secondary | ICD-10-CM

## 2014-10-03 DIAGNOSIS — H2513 Age-related nuclear cataract, bilateral: Secondary | ICD-10-CM

## 2014-10-03 DIAGNOSIS — E119 Type 2 diabetes mellitus without complications: Secondary | ICD-10-CM | POA: Insufficient documentation

## 2014-10-03 NOTE — Progress Notes (Signed)
I examined Paula Daugherty with the resident on the Comprehensive Service and concur with the findings as noted. Dr. Jeannine Kitten and I discussed the findings and developed the plan together as outlined.    Comment: Paula Daugherty is a 66yryear old female with history of diabetes mellitus type II and cataracts in both eyes, here for evaluation. Patient was evaluated by Dr. LAugustin Coupefor blepharoplasty and ptosis repair who noted patient had cataracts. Patient complaining of peripheral vision loss and has to lift her eyelids to read. Also notices that her vision blurs out after prolonged reading, closes her eyes to massage it. Vision is otherwise not limiting.     POHX:  Hyperopic astigmatism     Aponeurotic ptosis     Dermatochalasis     Cataracts OU     DM2, last HgbA1c 6.7%, no history of diabetic retinopathy     Maternal grandfather-glaucoma    Lab Results   Lab Name Value Date/Time    HGBA1C 6.7* 08/22/2014 08:15 AM    HGBA1C 7.6* 04/10/2014 03:05 PM    HGBA1C 7.0* 12/08/2013 10:30 AM       Assessment: Moderate nuclear senile cataract OU, OS>OD    ?Mild ERM OD    Diabetes mellitus type II without background diabetic retinopathy    I discussed these diagnoses with the patient in detail and answered all questions she had.    I discussed cataract surgery in detail with the patient including the risks of the procedure. Risks discussed include the possiblily of infection or a severe bleeding episode. Though unlikely, I discussed that either of these problems could result in no improvement in vision, worse vision or even the possibility of a complete loss of vision. In also discussed with her the possibility of a retinal tear or detachment. She also understands that it is possible that glasses will be needed to provide good vision for both reading and distance vision. She understands that cataract surgery is an elective surgery and that the only indication is her dissatisfaction with her vision.     Plan:  Artificial tears as  needed   Patient considering cataract surgery. Will call, contact information provided.    Follow up in one year, sooner with any problems     ----------------------  Scribe Disclaimer:  This note was scribed by EMallory Shirk a trained medical scribe, in the presence of JLivingston Diones M.D.     Provider Disclaimer:  This document serves as my personal record of services taken in my presence. It was created on 10/03/2014 on my behalf by EMallory Shirk a trained medical scribe. I have reviewed this document and agree that this note accurately reflects the history and exam findings, the patient care provided, and my medical decision making.    10/03/2014  Report electronically signed by:  JLivingston Diones MD   Professor  [facility] Department of Ophthalmology

## 2014-10-03 NOTE — Progress Notes (Signed)
[facility]  Ophthalmology Resident Note    Identification/Chief Complaint/History of Present Illness: Aleli Navedo is a 69yrold female with a history of DM type II, Ptosis, dermatochalasis, and cataracts who presents for cataract evaluation. The patient was evaluated in January by Dr. LAugustin Coupefor blepharoplasty and ptosis repair. She was noted to have cataracts and decreased vision, especially in the left eye. [place] She states that she has had decreasing vision for the past few years. This has affected her distance and near vision. Denies any glare or halos. Patient is currently using bifocals.    Past Ocular History:   Hyperopic astigmatism  Aponeurotic ptosis  Dermatochalasis  Cataracts OU  DM2, last HgbA1c 6.7%, no history of diabetic retinopathy  Maternal grandfather-glaucoma    Past Medical History:   No past medical history on file.  Patient Active Problem List   Diagnosis    DM2 (diabetes mellitus, type 2)    Depression with anxiety    Stress at home    Leg cramps-right calf    Right ear pain    Fibromyalgia    HTN (hypertension)    GERD (gastroesophageal reflux disease)    Hyperlipidemia with target LDL less than 70    Vitamin D insufficiency    Abnormal LFTs    Renal cyst ( 6.4 cm cyst left kidney)    Cough    Type 2 diabetes mellitus without complication    Fatty liver    Macroalbuminuric diabetic nephropathy     Ocular Medications:  None    Medications:  Current Outpatient Prescriptions on File Prior to Visit   Medication Sig Dispense Refill    Albuterol (PROAIR HFA, PROVENTIL HFA, VENTOLIN HFA) 90 mcg/actuation inhaler Take 1-2 puffs by inhalation every 4 hours if needed. 1 inhaler 1    Aspirin 81 mg DR Tablet EC Tablet Take 1 tablet by mouth every day. 30 tablet 5    Atorvastatin (LIPITOR) 10 mg Tablet Take 1 tablet by mouth every day at bedtime. 90  tablet 3    Beclomethasone (QVAR) 80 mcg/actuation Inhaler Take 1 puff by inhalation 2 times daily. rinse mouth after each use 8.7 g 1    Blood Sugar Diagnostic (ONETOUCH ULTRA TEST) Strips Use for testing blood glucose 3 times per day 300 strip 3    CALCIUM-MAGNESIUM-ZINC PO       canagliflozin (INVOKANA) 100 mg Tablet Take 1 tablet by mouth every day. 90 tablet 3    Cholecalciferol, Vitamin D3, (VITAMIN D-3) 2,000 unit Tablet Take 1 tablet by mouth every day.      Escitalopram (LEXAPRO) 20 mg tablet Take 1 tablet by mouth every day. 90 tablet 3    Fexofenadine (ALLEGRA) 180 mg tablet Take 1 tablet by mouth every day. 30 tablet 11    Fluconazole (DIFLUCAN) 150 mg Tablet Take 1 tablet by mouth one time. May repeat after 3 days 2 tablet 0    Fluticasone (FLONASE) 50 mcg/actuation nasal spray Instill 2 sprays into EACH nostril every day. 16 g 1    GlipiZIDE (GLUCOTROL XL) 5 mg SR Tablet Take 1 tablet by mouth every morning with a meal. 30 tablet 11    Inhalational Spacing Device (AEROCHAMBER/FLOWSIGNAL) Spacer To use with inhaler as directed 1 each 1    Losartan (COZAAR) 25 mg Tablet Take 1 tablet by mouth every day. 9[place]  tablet 3    Metformin (GLUCOPHAGE) 1,000 mg tablet Take 1 tablet by mouth 2 times daily with meals. (diabetes) 180 tablet 3    Omeprazole (PRILOSEC) 20 mg Delayed Release Capsule TAKE ONE CAPSULE BY MOUTH EVERY DAY 90 capsule 0    Oxybutynin (DITROPAN) 5 mg Tablet Take 5 mg by mouth 3 times daily.       No current facility-administered medications on file prior to visit.       Allergies:  Allergies   Allergen Reactions    Contrast Dye [Radiopaque Agent] Hives    Hydrochlorothiazide Other-Reaction in Comments     Patient reports    Januvia [Sitagliptin] Other-Reaction in Comments     Patient reports    Lyrica [Pregabalin] Other-Reaction in Comments     Patient reports    Omnipaque [Iohexol] Other-Reaction in Comments     Patient reports    Prozac [Fluoxetine Hcl] Other-Reaction in  Comments     Patient reports    Sulfa (Sulfonamide Antibiotics) Rash    Topiramate Other-Reaction in Comments     Patient reports       Exam:  See eye module in EMR    Impression:  1. Cataracts OU  -Patient referred by Dr. Augustin Coupe for cataract evaluation  -Undergoing preparation for blepharoplasty  -Exam shows nuclear sclerotic cataracts OU, OS>OD  -BCVA is 20/30-2 OD and 20/40+2 OS, myopic shift OS  -Cataracts likely visually significant  -Patient requesting cataract surgery as her vision has affected her quality of life  -Final plan per Dr. Eda Keys    2. DM Type II without retinopathy  -History of DM for 15 years  -Currently well controlled on metformin and glipizide  -Stressed importance of continued control  -Goal of HgbA1c 7.0% or less    3. Ptosis  4. Dermatochalasis  -Follow up with Dr. Augustin Coupe after cataract surgery      This patient was seen and discussed with Dr. Eda Keys. Please see attending's note for final assessment, plan, and recommendations for follow up    Eyvonne Mechanic, MD  PGY-II, [place]  Pager: [phone]

## 2014-10-12 ENCOUNTER — Other Ambulatory Visit: Payer: Self-pay | Admitting: Family Medicine

## 2014-10-12 NOTE — Telephone Encounter (Signed)
Patient last seen by PCP on  09/06/14

## 2014-10-17 ENCOUNTER — Ambulatory Visit: Payer: Self-pay

## 2014-10-24 ENCOUNTER — Other Ambulatory Visit: Payer: Self-pay

## 2014-10-24 NOTE — Progress Notes (Addendum)
Health Management and Education      Reviewed the pertinent portion of patients chart. Sent mychart message for Patient, requesting return call to schedule phone appointment. Awaiting Call back.           Electronically signed by   Keturah Barre, [employer], Care Manager   Health Management and Education  [phone]

## 2014-11-03 ENCOUNTER — Other Ambulatory Visit: Payer: Self-pay | Admitting: Adult Medicine

## 2014-11-03 NOTE — Telephone Encounter (Signed)
Pt last seen by PCP 09/06/2014

## 2014-11-14 ENCOUNTER — Other Ambulatory Visit: Payer: Self-pay

## 2014-11-14 NOTE — Progress Notes (Signed)
Health Care Management and Education   Telephone Outreach:      Pt responded to [place] that she is in [place] caring for her elderly mother. Sent mychart to follow up and see if she needed further resources. Awaiting reply from pt.       Electronically signed by   Keturah Barre, [place] Management and Education  [phone]

## 2014-11-21 ENCOUNTER — Encounter: Payer: Self-pay | Admitting: "Endocrinology

## 2014-12-05 ENCOUNTER — Other Ambulatory Visit: Payer: Self-pay | Admitting: Family Medicine

## 2014-12-05 DIAGNOSIS — E119 Type 2 diabetes mellitus without complications: Secondary | ICD-10-CM

## 2014-12-06 ENCOUNTER — Ambulatory Visit (INDEPENDENT_AMBULATORY_CARE_PROVIDER_SITE_OTHER): Payer: Medicare Other

## 2014-12-06 ENCOUNTER — Ambulatory Visit: Payer: Medicare Other | Attending: Family Medicine | Admitting: Family Medicine

## 2014-12-06 ENCOUNTER — Encounter: Payer: Self-pay | Admitting: Family Medicine

## 2014-12-06 VITALS — BP 129/85 | HR 69 | Temp 98.1°F | Wt 181.0 lb

## 2014-12-06 DIAGNOSIS — I10 Essential (primary) hypertension: Secondary | ICD-10-CM

## 2014-12-06 DIAGNOSIS — E1129 Type 2 diabetes mellitus with other diabetic kidney complication: Secondary | ICD-10-CM

## 2014-12-06 DIAGNOSIS — E119 Type 2 diabetes mellitus without complications: Secondary | ICD-10-CM | POA: Insufficient documentation

## 2014-12-06 DIAGNOSIS — Z872 Personal history of diseases of the skin and subcutaneous tissue: Secondary | ICD-10-CM | POA: Insufficient documentation

## 2014-12-06 DIAGNOSIS — E1121 Type 2 diabetes mellitus with diabetic nephropathy: Secondary | ICD-10-CM

## 2014-12-06 DIAGNOSIS — K760 Fatty (change of) liver, not elsewhere classified: Secondary | ICD-10-CM | POA: Insufficient documentation

## 2014-12-06 DIAGNOSIS — Z23 Encounter for immunization: Secondary | ICD-10-CM

## 2014-12-06 DIAGNOSIS — R252 Cramp and spasm: Secondary | ICD-10-CM | POA: Insufficient documentation

## 2014-12-06 DIAGNOSIS — R809 Proteinuria, unspecified: Secondary | ICD-10-CM | POA: Insufficient documentation

## 2014-12-06 LAB — HEMOGLOBIN A1C
Hgb A1C,Glucose Est Avg: 151 mg/dL
Hgb A1C: 6.9 % — ABNORMAL HIGH (ref 3.9–5.6)

## 2014-12-06 LAB — CBC WITH DIFFERENTIAL
Basophils % Auto: 0.4 %
Basophils Abs Auto: 0 10*3/uL (ref 0–0.2)
Eosinophils % Auto: 2 %
Eosinophils Abs Auto: 0.2 10*3/uL (ref 0–0.5)
Hematocrit: 42 % (ref 36–46)
Hemoglobin: 13.6 g/dL (ref 12.0–16.0)
Lymphocytes % Auto: 35 %
Lymphocytes Abs Auto: 3 10*3/uL (ref 1.0–4.8)
MCH: 26.9 pg — ABNORMAL LOW (ref 27–33)
MCHC: 32.4 % (ref 32–36)
MCV: 83.1 UM3 (ref 80–100)
MPV: 8.3 UM3 (ref 6.8–10.0)
Monocytes % Auto: 6.2 %
Monocytes Abs Auto: 0.5 10*3/uL (ref 0.1–0.8)
Neutrophils % Auto: 56.4 %
Neutrophils Abs Auto: 4.9 10*3/uL (ref 1.80–7.70)
Platelet Count: 234 10*3/uL (ref 130–400)
RDW: 14.8 UNITS — ABNORMAL HIGH (ref 0–14.7)
Red Blood Cell Count: 5.05 10*6/uL (ref 4.0–5.2)
White Blood Cell Count: 8.7 10*3/uL (ref 4.5–11.0)

## 2014-12-06 NOTE — Patient Instructions (Signed)
You have been referred to a specialist. We are pleased to offer the services of [place] specialists located at the [facility] or [place] sites. Please call: Gastroenterology: [facility], [phone] asking for hepatology to see you.        A laboratory test has been ordered for you.

## 2014-12-06 NOTE — Progress Notes (Signed)
Paula Daugherty is a 69yrold female patient is here for follow-up with PCP  Chief Complaint   Patient presents with    Other     3 mo f/u     Referral     derm     1. DM: A1c of 6.5; recent eye exam; on ARB; patient requesting to continue to see DR Paula Daugherty--referral updated   2. History of AKs: patient requesting to see derm; referral placed 12/06/2014   3. Right calf pain: x days without swelling now resolved; patient would like to monitor for any recurrence and avoid checking Ddimer due to recent plane trip to see her mother       ROS:   .Constitutional: no fever/chills.  CV: no chest pain, palpitations, shortness of breath or edema  Psych: worried about mother's declining health and possible dementia     Patient Active Problem List   Diagnosis    DM2 (diabetes mellitus, type 2)    Depression with anxiety    Stress at home    Leg cramps-right calf    Right ear pain    Fibromyalgia    HTN (hypertension)    GERD (gastroesophageal reflux disease)    Hyperlipidemia with target LDL less than 70    Vitamin D insufficiency    Abnormal LFTs    Renal cyst ( 6.4 cm cyst left kidney)    Cough    Type 2 diabetes mellitus without complication    Fatty liver    Macroalbuminuric diabetic nephropathy    History of actinic keratoses       Current Outpatient Prescriptions on File Prior to Visit   Medication Sig Dispense Refill    Albuterol (PROAIR HFA, PROVENTIL HFA, VENTOLIN HFA) 90 mcg/actuation inhaler Take 1-2 puffs by inhalation every 4 hours if needed. 1 inhaler 1    Amlodipine (NORVASC) 10 mg Tablet TAKE 1 TABLET BY MOUTH EVERY DAY. 90 tablet 1    Aspirin 81 mg DR Tablet EC Tablet Take 1 tablet by mouth every day. 30 tablet 5    Atorvastatin (LIPITOR) 10 mg Tablet Take 1 tablet by mouth every day at bedtime. 90 tablet 3    Blood Sugar Diagnostic (ONETOUCH ULTRA TEST) Strips Use for testing blood glucose 3 times per day 300 strip 3    CALCIUM-MAGNESIUM-ZINC PO       canagliflozin (INVOKANA) 100 mg Tablet  Take 1 tablet by mouth every day. 90 tablet 3    Cholecalciferol, Vitamin D3, (VITAMIN D-3) 2,000 unit Tablet Take 1 tablet by mouth every day.      Escitalopram (LEXAPRO) 20 mg tablet Take 1 tablet by mouth every day. 90 tablet 3    Fexofenadine (ALLEGRA) 180 mg tablet Take 1 tablet by mouth every day. 30 tablet 11    Fluconazole (DIFLUCAN) 150 mg Tablet Take 1 tablet by mouth one time. May repeat after 3 days 2 tablet 0    Fluticasone (FLONASE) 50 mcg/actuation nasal spray Instill 2 sprays into EACH nostril every day. 16 g 1    GlipiZIDE (GLUCOTROL XL) 5 mg SR Tablet Take 1 tablet by mouth every morning with a meal. 30 tablet 11    Inhalational Spacing Device (AEROCHAMBER/FLOWSIGNAL) Spacer To use with inhaler as directed 1 each 1    Losartan (COZAAR) 25 mg Tablet Take 1 tablet by mouth every day. 90 tablet 3    Metformin (GLUCOPHAGE) 1,000 mg tablet Take 1 tablet by mouth 2 times daily with meals. (diabetes) 180 tablet 3  Omeprazole (PRILOSEC) 20 mg Delayed Release Capsule TAKE ONE CAPSULE BY MOUTH EVERY DAY 90 capsule 1    Oxybutynin (DITROPAN) 5 mg Tablet Take 5 mg by mouth 3 times daily.       No current facility-administered medications on file prior to visit.     Allergies:   Allergies   Allergen Reactions    Contrast Dye [Radiopaque Agent] Hives    Hydrochlorothiazide Other-Reaction in Comments     Patient reports    Januvia [Sitagliptin] Other-Reaction in Comments     Patient reports    Lyrica [Pregabalin] Other-Reaction in Comments     Patient reports    Omnipaque [Iohexol] Other-Reaction in Comments     Patient reports    Prozac [Fluoxetine Hcl] Other-Reaction in Comments     Patient reports    Sulfa (Sulfonamide Antibiotics) Rash    Topiramate Other-Reaction in Comments     Patient reports       History     Social History    Marital Status: MARRIED     Spouse Name: N/A    Number of Children: 1    Years of Education: N/A     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[employer]       Social History Main Topics    Smoking status: Former Smoker -- 0.10 packs/day for 20 years    Smokeless tobacco: Never Used    Alcohol Use: 0.0 oz/week     0 Standard drinks or equivalent per week      Comment: rare shares a beer or glass wine     Drug Use: No    Sexual Activity:     Partners: Male     Other Topics Concern    None     Social History Narrative     Family History   Problem Relation Age of Onset    Diabetes Father     Heart Mother      MI at 3s     Non-contributory Brother     Non-contributory Brother        PE:  BP 129/85 mmHg  Pulse 69  Temp(Src) 36.7 C (98.1 F) (Temporal)  Wt 82.101 kg (181 lb)  LMP 05/06/1983  .General Appearance: healthy, alert, no distress, pleasant affect, cooperative.  Heart:  normal rate and regular rhythm, no murmurs, clicks, or gallops.  Lungs: clear to auscultation.  Extremities:  no cyanosis, clubbing, or edema.  Mental Status: appropriate affect, behavior and mentation     Assessment and Plan:  (E11.29,  R80.9) Macroalbuminuric diabetic nephropathy  (primary encounter diagnosis)  Comment: on ARB; A1c at goal   Plan: to monitor     (I10) Essential hypertension  Comment: at goal   Plan: CBC WITH DIFFERENTIAL          (R25.2) Cramps of right lower extremity  Comment: recent symptoms x days now resolved  Plan: to monitor     (K76.0) Fatty liver  Comment: per previous ultrasound with possible portal hypertension   Plan: patient aware of need to schedule visit with hepatology     (Z87.2) History of actinic keratoses  Comment: patient requesting referral to see derm   Plan: [place]            Total encounter time including history, physical examination, and coordination of care was approximately 20 minutes of which more than 50% was spent counseling regarding assessment/diagnosis and treatment plan regarding care and mgnt of  DM, HTN and fatty liver--patient verbalized understanding. No guarantees were made regarding her medical  care or treatment outcome. Barriers to Learning: none.  Patient verbalizes understanding of teaching and instructions.    Electronically signed by:    [name], MD  [place], [place] Board of Family Medicine  Associate physician [facility], [place]   [phone]

## 2014-12-06 NOTE — Nursing Note (Signed)
Vital signs taken, allergies and pharmacy verified. Screened for pain. Med list given to patient for review.

## 2014-12-07 NOTE — Pre-Op/Pre-Procedure Screening (Signed)
Patient Name: Paula Daugherty  40yr [birth year]   Scheduled Surgery Date: 12/18/2014  Proposed Surgery: Procedure(s):  PHACOEMULSIFICATION (Left)  IMPLANTATION INTRAOCULAR LENS (POSTERIOR CHAMBER) (Left)  Pre-op Dx: Senile nuclear cataract, bilateral [H25.13]  Surgeon: Surgeon(s):  JLivingston Diones MD  RDorma Russell MD    Problems:   Patient Active Problem List    Diagnosis Date Noted    History of actinic keratoses 12/06/2014    Macroalbuminuric diabetic nephropathy 09/12/2014    Fatty liver 08/28/2014     Overview Note:     Possible portal HTN--hepatology referral placed 08/28/2014.       Type 2 diabetes mellitus without complication 045/40/9811   Cough 12/14/2013    Renal cyst ( 6.4 cm cyst left kidney) 08/24/2013    Hyperlipidemia with target LDL less than 70 07/28/2013    Vitamin D insufficiency 07/28/2013    Abnormal LFTs 07/28/2013    DM2 (diabetes mellitus, type 2) 07/11/2013     Overview Note:     10/15 Diabetes- Dining with Diabetes: Basics  03/10/2014 Diabetes - Recharge Workshop      Depression with anxiety 07/11/2013    Stress at home 07/11/2013    Leg cramps-right calf 07/11/2013    Right ear pain 07/11/2013    Fibromyalgia 07/11/2013    HTN (hypertension) 07/11/2013    GERD (gastroesophageal reflux disease) 07/11/2013       PMH:  No past medical history on file.    PSH:  Past Surgical History   Procedure Laterality Date    Pr ligate fallopian tube       Tubal ligation    Pr total abdom hysterectomy  1980s     partial; no BSO    Pr knee scope,diagnostic  1980s     Knee arthroscopy    Pr repair tympanic membrane       Tympanoplasty    Colonoscopy  01/04/14     polyp--TA and erosion; hemorrhoids; tics; repeat x 3 yrs    Reduction breast  1985       Anesthetic Hx: Denies anesthesia complications.     Allergies:   Allergies   Allergen Reactions    Contrast Dye [Radiopaque Agent] Hives    Hydrochlorothiazide Other-Reaction in Comments     Patient reports    Januvia [Sitagliptin]  Other-Reaction in Comments     Patient reports    Lyrica [Pregabalin] Other-Reaction in Comments     Patient reports    Omnipaque [Iohexol] Other-Reaction in Comments     Patient reports    Prozac [Fluoxetine Hcl] Other-Reaction in Comments     Patient reports    Sulfa (Sulfonamide Antibiotics) Rash    Topiramate Other-Reaction in Comments     Patient reports       Medications: .  Outpatient Prescriptions Marked as Taking for the 12/18/14 encounter [facility]   Medication Sig Dispense Refill    Albuterol (PROAIR HFA, PROVENTIL HFA, VENTOLIN HFA) 90 mcg/actuation inhaler Take 1-2 puffs by inhalation every 4 hours if needed. 1 inhaler 1    Amlodipine (NORVASC) 10 mg Tablet TAKE 1 TABLET BY MOUTH EVERY DAY. 90 tablet 1    Aspirin 81 mg DR Tablet EC Tablet Take 1 tablet by mouth every day. 30 tablet 5    Atorvastatin (LIPITOR) 10 mg Tablet Take 1 tablet by mouth every day at bedtime. 90 tablet 3    CALCIUM-MAGNESIUM-ZINC PO       canagliflozin (INVOKANA) 100 mg Tablet Take 1 tablet  by mouth every day. 90 tablet 3    Cholecalciferol, Vitamin D3, (VITAMIN D-3) 2,000 unit Tablet Take 1 tablet by mouth every day.      Escitalopram (LEXAPRO) 20 mg tablet Take 1 tablet by mouth every day. 90 tablet 3    Fexofenadine (ALLEGRA) 180 mg tablet Take 1 tablet by mouth every day. 30 tablet 11    Fluticasone (FLONASE) 50 mcg/actuation nasal spray Instill 2 sprays into EACH nostril every day. 16 g 1    Losartan (COZAAR) 25 mg Tablet Take 1 tablet by mouth every day. 90 tablet 3    Metformin (GLUCOPHAGE) 1,000 mg tablet Take 1 tablet by mouth 2 times daily with meals. (diabetes) 180 tablet 3    Omeprazole (PRILOSEC) 20 mg Delayed Release Capsule TAKE ONE CAPSULE BY MOUTH EVERY DAY 90 capsule 1    Oxybutynin (DITROPAN) 5 mg Tablet Take 5 mg by mouth 3 times daily.                      Drug/Alcohol Hx:  History   Smoking status    Former Smoker -- 0.10 packs/day for 20 years   Smokeless tobacco    Never Used      History   Alcohol Use    0.0 oz/week    0 Standard drinks or equivalent per week     Comment: rare shares a beer or glass wine      History   Drug Use No       CV/Pulm  Tests:   PFT 03/20/2014  Impression     Mixed restriction and airflow obstruction. Significant increase in FEV1  post-bronchodilator. A reduced ERV may be due to body habitus, neuromuscular  weakness, thoracic cage abnormalities, or pleural disease. Normal DLCO.    Results for Paula, Daugherty (MRN [id number]) as of 12/07/2014 15:22   Ref. Range 03/20/2014 13:06   FEV1 (PRE) Latest Ref Range: 0 - 12 Liters 1.73   FEV1 (POST) Latest Ref Range: 0 - 12 Liters 1.94   FVC (PRE) Latest Ref Range: 0 - 12 Liters 2.28   FVC (POST) Latest Ref Range: 0 - 12 Liters 2.39   FEV1/FVC (PRE) Latest Ref Range: 0 - 12 % 76   FEV1/FVC (POST) Latest Ref Range: 0 - 12 % 81   TLC (PRE) Latest Ref Range: 0.05-11.99 Liters 3.73   DLCO (POST) Latest Ref Range: 0.05-99.99 mL/mmHg/min 16.8     Sleep Study 10    Labs:   CBC:     Lab Results  Component Value Date/Time   WHITE BLOOD CELL COUNT 8.7 12/06/2014 1145   RED CELL COUNT 5.05 12/06/2014 1145   HEMOGLOBIN 13.6 12/06/2014 1145   HEMATOCRIT 42.0 12/06/2014 1145   MCV 83.1 12/06/2014 1145   MCH 26.9* 12/06/2014 1145   RDW 14.8* 12/06/2014 1145   PLATELET COUNT 234 12/06/2014 1145       BMP/CMP:    Lab Results  Component Value Date/Time   SODIUM 141 08/22/2014 0815   POTASSIUM 4.2 08/22/2014 0815   CHLORIDE 103 08/22/2014 0815   CARBON DIOXIDE TOTAL 25 08/22/2014 0815   CREATININE BLOOD 0.82 08/22/2014 0815   E-GFR, AFRICAN AMERICAN >60 08/22/2014 0815   E-GFR, NON-AFRICAN AMERICAN >60 08/22/2014 0815   UREA NITROGEN, BLOOD (BUN) 17 08/22/2014 0815   GLUCOSE 109* 08/22/2014 0815   CALCIUM 9.5 08/22/2014 0815       Lab Results  Component Value Date/Time   PROTEIN 7.7 08/22/2014 0815  ALBUMIN 3.9 08/22/2014 0815   ALKALINE PHOSPHATASE (ALP) 97 08/22/2014 0815   ASPARTATE TRANSAMINASE (AST) 40 08/22/2014 0815   BILIRUBIN TOTAL  0.8 08/22/2014 0815   ALANINE TRANSFERASE (ALT) 38 08/22/2014 0815       HgBA1C:   Lab Results   Lab Name Value Date/Time    HGBA1C 6.9* 12/06/2014 11:45 AM    HGBA1C 6.7* 08/22/2014 08:15 AM    HGBA1C 7.6* 04/10/2014 03:05 PM       Coag Panel:    Lab Results  Component Value Date/Time   INR 1.02 12/08/2013 1030       Thyroid Panel:    Lab Results  Component Value Date/Time   THYROID STIMULATING HORMONE 1.58 12/08/2013 1030       Type and Screen:   No results found for: ABO    ROS:  CV: HTN well controlled,HLD. Denies MI. Denies  palpitations, DOE, PND, orthopnea, dizziness or edema.  Pt. is able to lie flat without difficulty breathing.   Resp: Former smoker,Seasonal Allergies,Mild Asthma - stable on routine/prn MDI. Denies  sleep apnea, URI or history of pneumonia  Neuro/Psych: Depression, Bilateral hearing loss.Denies CVA, TIA, seizures, neurologic disease  Musculoskeletal:  Fibromyalgia  Med: GERD-controlled,Renal Cyst,fatty Liver,DM 2 well controlled AFBS 150-194,Vitamin D deficiency.   Denies blood dyscrasia/ coagulopathy. Denies recent antibiotic use, hospitalization or ED visit.      Activity: .able to walk 2 blocks, harder to climb stairs     PE: (per patient report)  Airway:  Denies jaw/TMJ problems. Denies loose, missing or broken teeth.  Denies anything in her mouth that is easily removable.    Neck:  Denies pain in the neck/radicular symptoms with extension and flexion.    Vitals:  Temp: 36.7 C (98.1 F) (12/06/2014  9:51 AM)  Temp src: Temporal (12/06/2014  9:51 AM)  Pulse: 69 (12/06/2014  9:51 AM)  BP: 129/85 mmHg (12/06/2014  9:51 AM)  Resp: 12 (09/12/2014  1:26 PM)  SpO2: 95 % (09/12/2014  1:26 PM)  Height: 1.575 m (5' 2" ) (09/12/2014  1:26 PM)  Weight: 82.101 kg (181 lb) (12/06/2014  9:51 AM)      Patient's last menstrual period was 05/06/1983.      Pt. Instructions: NPO instructions  per eye clinic, medications(DOS: omeprazole,amlodipine,advair,albuterol prn   .Usual morning eye drops per ophthalmology clinic  instructions.Pt verbalized understanding of medication instructions and verbal read back  of medications to be taken on morning of procedure  was done for confirmation. ),anesthesia      Instructions for Day of Surgery:  -Please stop NSAID (motrin, ibuprofen, aleve, naproxen, etc) & herbal/nutritional supplement (esp. Fish oil, vitamin E , C, gingko, garlic tablets) 1 week prior to surgery to minimize the risk of bleeding or anesthetic interaction.       Please discuss with your surgeon and primary care physician/Anticoagulation clinic if you take blood thinners.  You will receive a phone call from a pre-op nurse 1-2 working days  before your surgery date to confirm your check-in and location, arrival time, and surgical time. They will also review your fasting guidelines with you.     Keylani Perlstein Charmaine Downs, RN

## 2014-12-08 ENCOUNTER — Ambulatory Visit: Payer: Medicare Other | Attending: Ophthalmology | Admitting: Ophthalmology

## 2014-12-08 ENCOUNTER — Encounter: Payer: Self-pay | Admitting: Ophthalmology

## 2014-12-08 DIAGNOSIS — H2512 Age-related nuclear cataract, left eye: Secondary | ICD-10-CM | POA: Insufficient documentation

## 2014-12-08 DIAGNOSIS — Z01818 Encounter for other preprocedural examination: Secondary | ICD-10-CM | POA: Insufficient documentation

## 2014-12-08 DIAGNOSIS — H2513 Age-related nuclear cataract, bilateral: Secondary | ICD-10-CM

## 2014-12-08 MED ORDER — PREDNISOLONE ACETATE 1 % EYE DROPS,SUSPENSION
OPHTHALMIC | Status: DC
Start: 2014-12-08 — End: 2015-01-25

## 2014-12-08 MED ORDER — DICLOFENAC 0.1 % EYE DROPS: 1.0000 [drp] | Freq: Four times a day (QID) | OPHTHALMIC | Status: DC

## 2014-12-08 MED ORDER — MOXIFLOXACIN 0.5 % EYE DROPS
OPHTHALMIC | Status: DC
Start: 2014-12-08 — End: 2015-01-25

## 2014-12-08 NOTE — Progress Notes (Signed)
[place]  Ophthalmology Resident Note    Identification/Chief Complaint/History of Present Illness: Paula Daugherty is a 69yrold female who presents for cataract pre-operative appointment. The patient denies loss of vision, red eye, burning, itching, new floaters, seeing flashes of light, photophobia, eye pain, and double vision.    Past Ocular History:  Hyperopic astigmatism   Aponeurotic ptosis   Dermatochalasis   Cataracts OU   DM2, last HgbA1c 6.9% (12/06/14), no history of diabetic retinopathy   Maternal grandfather-glaucoma    Past Medical History:   No past medical history on file.  Patient Active Problem List   Diagnosis    DM2 (diabetes mellitus, type 2)    Depression with anxiety    Stress at home    Leg cramps-right calf    Right ear pain    Fibromyalgia    HTN (hypertension)    GERD (gastroesophageal reflux disease)    Hyperlipidemia with target LDL less than 70    Vitamin D insufficiency    Abnormal LFTs    Renal cyst ( 6.4 cm cyst left kidney)    Cough    Type 2 diabetes mellitus without complication    Fatty liver    Macroalbuminuric diabetic nephropathy    History of actinic keratoses       Review of Systems:  Constitutional: no fevers, no chills, no night sweats, no unintentional weight loss, no loss of appetite, no fatigue  Cardiovascular: no palpitations, no chest pain, no peripheral edema  Respiratory:no dyspnea, no pain with breathing, no cough, no hemoptysis  Skin: no photosensitivity, no rash, no itching, no alopecia  Neurologic: no focal weakness, no h/o seizures, no headache    Ocular Medications:  None    Medications:  Current Outpatient Prescriptions on File Prior to Visit   Medication Sig Dispense Refill    Albuterol (PROAIR HFA, PROVENTIL HFA, VENTOLIN HFA) 90 mcg/actuation inhaler Take 1-2 puffs by inhalation every 4 hours if needed. 1 inhaler 1    Amlodipine (NORVASC) 10 mg Tablet TAKE 1 TABLET BY MOUTH EVERY DAY. 90 tablet 1    Aspirin 81 mg DR Tablet  EC Tablet Take 1 tablet by mouth every day. 30 tablet 5    Atorvastatin (LIPITOR) 10 mg Tablet Take 1 tablet by mouth every day at bedtime. 90 tablet 3    Blood Sugar Diagnostic (ONETOUCH ULTRA TEST) Strips Use for testing blood glucose 3 times per day 300 strip 3    CALCIUM-MAGNESIUM-ZINC PO       canagliflozin (INVOKANA) 100 mg Tablet Take 1 tablet by mouth every day. 90 tablet 3    Cholecalciferol, Vitamin D3, (VITAMIN D-3) 2,000 unit Tablet Take 1 tablet by mouth every day.      Escitalopram (LEXAPRO) 20 mg tablet Take 1 tablet by mouth every day. 90 tablet 3    Fexofenadine (ALLEGRA) 180 mg tablet Take 1 tablet by mouth every day. 30 tablet 11    Fluconazole (DIFLUCAN) 150 mg Tablet Take 1 tablet by mouth one time. May repeat after 3 days 2 tablet 0    Fluticasone (FLONASE) 50 mcg/actuation nasal spray Instill 2 sprays into EACH nostril every day. 16 g 1    FLUTICASONE/SALMETEROL (ADVAIR DISKUS INHA) Take  by inhalation.      GlipiZIDE (GLUCOTROL XL) 5 mg SR Tablet Take 1 tablet by mouth every morning with a meal. 30 tablet 11    Inhalational Spacing Device (AEROCHAMBER/FLOWSIGNAL) Spacer To use with inhaler as directed 1 each 1  Losartan (COZAAR) 25 mg Tablet Take 1 tablet by mouth every day. 90 tablet 3    Metformin (GLUCOPHAGE) 1,000 mg tablet Take 1 tablet by mouth 2 times daily with meals. (diabetes) 180 tablet 3    Omeprazole (PRILOSEC) 20 mg Delayed Release Capsule TAKE ONE CAPSULE BY MOUTH EVERY DAY 90 capsule 1    Oxybutynin (DITROPAN) 5 mg Tablet Take 5 mg by mouth 3 times daily.       No current facility-administered medications on file prior to visit.       Allergies:  Allergies   Allergen Reactions    Contrast Dye [Radiopaque Agent] Hives    Hydrochlorothiazide Other-Reaction in Comments     Patient reports    Januvia [Sitagliptin] Other-Reaction in Comments     Patient reports    Lyrica [Pregabalin] Other-Reaction in Comments     Patient reports    Omnipaque [Iohexol]  Other-Reaction in Comments     Patient reports    Prozac [Fluoxetine Hcl] Other-Reaction in Comments     Patient reports    Sulfa (Sulfonamide Antibiotics) Rash    Topiramate Other-Reaction in Comments     Patient reports       Exam:  See eye module in EMR    Impression:  1. Visually significant senile nuclear cataracts OU, OS > OD  -Patient reports very low pain tolerance    2. Aponeurotic ptosis:   -Patient being evaluated by. Dr. Augustin Coupe for blepharoplasty and ptosis repair    Plan:  -Patient is scheduled for cataract surgery on 8/15  -Patient to start vigamox, prednisolone, and diclofenac gtts on 8/13, written instructions given to patient  -Pain management/anesthesia plan per Dr. Eda Keys and Anesthesia    This patient was seen and discussed with Dr. Eda Keys. Please see attending's note for final assessment, plan, and recommendations for follow up    Katheran Awe, MD, MPH  PGY-2, [facility]. of Ophthalmology  Service Pager: [phone]  Personal Pager: [phone]

## 2014-12-08 NOTE — Procedures (Signed)
Topography was done to determine the patient's eligibility for an advanced technology IOL and should not be billed to insurance.    I have reviewed the [facility] scans and they demonstrate good waveforms and consistant values. The scan was interpreted to determine the IOL to be used for the patient's upcomming surgery.     Report electronically signed by:   Daje Stark J Jatoria Kneeland, MD   Professor  [facility]  Department of Ophthalmology

## 2014-12-08 NOTE — Procedures (Signed)
IOL and Topo performed [place], [facility]

## 2014-12-08 NOTE — Progress Notes (Signed)
Ms. Pfluger is a 69yrold female who presents for a preoperative evaluation for cataract surgery. She reports her vision is very bothersome and is limiting her daily activities. She reports particular trouble with reading.    I again discussed cataract surgery in detail with the patient including the risks of the procedure. Risks discussed include the possiblily of infection, a severe bleeding episode as well as other risks. Though unlikely, I discussed with her that any of these problems could result in no improvement in vision, worse vision or even the possibility of a complete loss of vision. She also understands that any of these occurrences could necessitate futher surgery. I also discussed with her the possibility of a retinal tear or detachment. She also understands that it is possible that glasses will be needed to provide good vision for both reading and distance vision. She understands that cataract surgery is an elective surgery and that the only indication is her dissatisfaction with her vision. She has decided she would like to pursue cataract surgery.     Pre and postoperative instructions were reviewed with the patient in detail. A written copy was given to the patient with emergency contact information. The patient confirmed her understanding of the instructions which include starting the medicated drops prior to surgery per the prescription, avoiding eating for 8 hours before surgery, though clear liquids are OK up to 2 hours before surgery.     ASSESSMENT: Senile nuclear cataract, left eye    PLAN: Cataract surgery with lens implant, left eye, TARGET DISTANCE VISION  Use MPF lidocaine   NORES    Report electronically signed by:   JLivingston Diones MD   Professor  [facility] Department of Ophthalmology and Vision Science

## 2014-12-08 NOTE — Nursing Note (Signed)
I have reviewed all pre-op instructions and surgery location with patient and or parent's.   //James Xiong, COA

## 2014-12-11 ENCOUNTER — Ambulatory Visit: Payer: Medicare Other

## 2014-12-11 DIAGNOSIS — Z7189 Other specified counseling: Secondary | ICD-10-CM

## 2014-12-12 NOTE — Progress Notes (Signed)
Stress Management Class   Date of Service:  12/11/14      Health Management and Education  120 minutes of group education    Paula Daugherty attended and participated in this class with support person.    Topics Covered:  1. Perceived Stress Scale   2. What is stress   3. The stress cycle  4. How stress affects health  5. Stress reduction techniques/concepts:  a. Diaphragmatic breathing   b. Affirmations  c. Mindfulness  d. Meditation   e. Imagery   f. Tai chi   g. Perception    Items Provided:  1. Perceived Stress Scale  2. Stress Action Plan  3. Various stress reduction techniques and resources     Barriers to Learning: None.  Patient/Family Understanding: verbalizes and return demonstration.  Contact information provided.    Facilitator:  [place] [facility]

## 2014-12-17 NOTE — Discharge Instructions (Signed)
Cataract Surgery Postoperative Instructions     Leave the patch on your eye until you are seen in the clinic tomorrow. It is okay to temporarily remove the eye patch to put in your eyedrops.   No heavy lifting (more than 15 pounds); no straining or bending over at the waist.   It is okay to shower, but keep your face dry.   It is okay to watch television.   For the first 24 hours, we recommend no driving.   Continue taking your regular home medications.   Continue  using your drops (the ones you started 2 days before surgery) in the operative eye 2 more times today (dinner, bedtime).    Starting tomorrow, you will use the eye drops 4 times per day (breakfast, lunch, dinner, and bedtime).   Usually there is only a small amount of pain with this surgery. If you have moderate pain, take Tylenol.   You should call your doctor immediately if:   Your vision suddenly changes (gets worse)   You have sudden pain in the operated eye   Your eye becomes more red or develops pus or white discharge    Possible Problems/Emergency Contact Numbers:     If you develop any symptoms such as fevers with temperature greater than 101.5 F, chills, persistent nausea and vomiting, inability to urinate, severe pain not controlled with medications, pus drainage from the eye, or for any acute problems or illnesses, contact your physician:    ANYTIME/Daytime (8:30am to 4:30pm): Ophthalmology Clinic [phone]    Night/Weekend: [phone] Ask the hospital operator to page the Ophthalmology (eye) Resident on-call. Have your medical record number available.    If you are unable to contact your physician, go to the Emergency Room nearest to you.      General Postoperative Instructions:    Immediate post-op period: It is normal to feel dizzy and sleepy for several hours after your operation. Therefore, you should not drive, operate any equipment, sign any important papers or make any significant decisions until  the next day.    Diet: Start with clear liquids. Progress to a normal diet as tolerated. You have received pain medication that may make you sick to your stomach. Soups and foods that are easy to digest are best tolerated as you begin to eat (avoid spicy and fatty food). Drink plenty of fluids. Do not drink alcoholic beverages for 24 hours.    Temperature: Please report any temperature over 101 degrees to your physician.

## 2014-12-18 ENCOUNTER — Ambulatory Visit
Admission: RE | Admit: 2014-12-18 | Discharge: 2014-12-18 | DRG: 125 | Disposition: A | Discharge status: Home / Self Care | Payer: Medicare Other | Source: Ambulatory Visit | Attending: Ophthalmology | Admitting: Ophthalmology

## 2014-12-18 ENCOUNTER — Ambulatory Visit: Payer: Medicare Other | Admitting: Certified Registered"

## 2014-12-18 ENCOUNTER — Ambulatory Visit (HOSPITAL_BASED_OUTPATIENT_CLINIC_OR_DEPARTMENT_OTHER): Payer: Medicare Other | Admitting: Certified Registered"

## 2014-12-18 ENCOUNTER — Encounter
Admission: RE | Disposition: A | Discharge status: Home / Self Care | Payer: Self-pay | Source: Ambulatory Visit | Attending: Ophthalmology

## 2014-12-18 DIAGNOSIS — H2512 Age-related nuclear cataract, left eye: Secondary | ICD-10-CM

## 2014-12-18 DIAGNOSIS — H269 Unspecified cataract: Secondary | ICD-10-CM

## 2014-12-18 SURGERY — PHACOEMULSIFICATION, CATARACT
Anesthesia: Monitored Anesthesia Care | Site: Eye | Laterality: Left | Wound class: Clean

## 2014-12-18 MED ORDER — FLURBIPROFEN 0.03 % EYE DROPS
3.0000 [drp] | OPHTHALMIC | Status: AC
Start: 2014-12-18 — End: 2014-12-18
  Administered 2014-12-18: 3 [drp] via OPHTHALMIC
  Filled 2014-12-18: qty 2.5

## 2014-12-18 MED ORDER — FENTANYL (PF) 50 MCG/ML INJECTION SOLUTION
INTRAMUSCULAR | Status: DC | PRN
Start: 2014-12-18 — End: 2014-12-18
  Administered 2014-12-18 (×2): 50 ug via INTRAVENOUS

## 2014-12-18 MED ORDER — TETRACAINE 0.5 % EYE DROPS
3.0000 [drp] | OPHTHALMIC | Status: AC
Start: 2014-12-18 — End: 2014-12-18
  Administered 2014-12-18: 3 [drp] via OPHTHALMIC
  Filled 2014-12-18: qty 15

## 2014-12-18 MED ORDER — PHENYLEPHRINE 2.5 % EYE DROPS
3.0000 [drp] | OPHTHALMIC | Status: AC
Start: 2014-12-18 — End: 2014-12-18
  Administered 2014-12-18: 3 [drp] via OPHTHALMIC
  Filled 2014-12-18: qty 2

## 2014-12-18 MED ORDER — METOCLOPRAMIDE 5 MG/ML INJECTION SOLUTION
10.0000 mg | INTRAMUSCULAR | Status: DC | PRN
Start: 2014-12-18 — End: 2014-12-18

## 2014-12-18 MED ORDER — INTRAOP BALANCED SALT SOLUTION REGULAR 15 ML BOTTLE
Status: DC | PRN
Start: 2014-12-18 — End: 2014-12-18
  Administered 2014-12-18: 14:00:00

## 2014-12-18 MED ORDER — LACTATED RINGERS IV INFUSION
INTRAVENOUS | Status: DC
Start: 2014-12-18 — End: 2014-12-18

## 2014-12-18 MED ORDER — GATIFLOXACIN 0.5 % EYE DROPS
OPHTHALMIC | Status: DC | PRN
Start: 2014-12-18 — End: 2014-12-18
  Administered 2014-12-18: 2 [drp] via OPHTHALMIC

## 2014-12-18 MED ORDER — INTRAOP EPINEPHRINE 1 MG/ML PF INJ 1 ML AMPULE
Status: DC | PRN
Start: 2014-12-18 — End: 2014-12-18
  Administered 2014-12-18: 14:00:00 via INTRAOCULAR

## 2014-12-18 MED ORDER — MIDAZOLAM (PF) 1 MG/ML INJECTION SOLUTION
INTRAMUSCULAR | Status: DC | PRN
Start: 2014-12-18 — End: 2014-12-18
  Administered 2014-12-18: 2 mg via INTRAVENOUS
  Administered 2014-12-18: 0.5 mg via INTRAVENOUS
  Administered 2014-12-18: 1 mg via INTRAVENOUS

## 2014-12-18 MED ORDER — INTRAOP STERILE WATER 500 ML IRRIGATION BOTTLE
Status: DC | PRN
Start: 2014-12-18 — End: 2014-12-18
  Administered 2014-12-18: 250 mL

## 2014-12-18 MED ORDER — LIDOCAINE HCL 10 MG/ML (1 %) INJECTION SOLUTION
0.1000 mL | INTRAMUSCULAR | Status: DC | PRN
Start: 2014-12-18 — End: 2014-12-18

## 2014-12-18 MED ORDER — LACTATED RINGERS IV INFUSION
INTRAVENOUS | Status: DC
Start: 2014-12-18 — End: 2014-12-18
  Administered 2014-12-18 (×2): via INTRAVENOUS

## 2014-12-18 MED ORDER — HYDROMORPHONE 1 MG/ML INJECTION SYRINGE
0.2000 mg | INJECTION | INTRAMUSCULAR | Status: DC | PRN
Start: 2014-12-18 — End: 2014-12-18

## 2014-12-18 MED ORDER — ACETAMINOPHEN 325 MG TABLET
650.0000 mg | ORAL_TABLET | Freq: Four times a day (QID) | ORAL | Status: DC | PRN
Start: 2014-12-18 — End: 2014-12-18

## 2014-12-18 MED ORDER — PREDNISOLONE ACETATE 1 % EYE DROPS,SUSPENSION
OPHTHALMIC | Status: DC | PRN
Start: 2014-12-18 — End: 2014-12-18
  Administered 2014-12-18: 2 [drp] via OPHTHALMIC

## 2014-12-18 MED ORDER — TROPICAMIDE 1 % EYE DROPS
3.0000 [drp] | OPHTHALMIC | Status: AC
Start: 2014-12-18 — End: 2014-12-18
  Administered 2014-12-18: 3 [drp] via OPHTHALMIC

## 2014-12-18 MED ORDER — ONDANSETRON HCL (PF) 4 MG/2 ML INJECTION SOLUTION
4.0000 mg | INTRAMUSCULAR | Status: DC | PRN
Start: 2014-12-18 — End: 2014-12-18

## 2014-12-18 MED ORDER — TETRACAINE 0.5 % EYE DROPS
OPHTHALMIC | Status: DC | PRN
Start: 2014-12-18 — End: 2014-12-18
  Administered 2014-12-18 (×2): 2 [drp] via OPHTHALMIC

## 2014-12-18 SURGICAL SUPPLY — 15 items
CARTIDGE MONARCH III C FOR IOL DELIVERY SYSTEM (Other) ×2 IMPLANT
DEVICE SHIELD CLEAR PLASTIC (Dressing) ×2 IMPLANT
GLOVES BIOGEL 6 1/2 TOP GLOVE LATEX (Glove) ×2 IMPLANT
GLOVES BIOGEL 7 TOP GLOVE LATEX (Glove) ×2 IMPLANT
GLOVES BIOGEL SENSOR 6 1/2 TOP GLOVE LATEX BURGUNDY PACKAGE (Glove) ×2 IMPLANT
GLOVES BIOGEL SENSOR 8 1/2 TOP GLOVE LATEX BURGUNDY PACKAGE (Glove) ×2 IMPLANT
GOWN LARGE AERO BLUE AAMI 3 STANDARD (Gown) IMPLANT
HEADREST DONUT 9IN (M10-090) (Other) ×2 IMPLANT
IOL ACRYSOF SN60WF +22.5 (Intraocular lens) ×2 IMPLANT
PACK INFINITI ANTERIOR VITRECTOMY WITH INFUSION CATHETER (Pack) IMPLANT
PACK INFINITI PIK (Pack) ×2 IMPLANT
PACK KIM/CASPAR EYE (Pack) ×2 IMPLANT
PACK PHACO INFINITI ULTRASOUND FMS (Pack) IMPLANT
SLEEVE COAXIAL ANTERIOR VITRECTECTOMY INFERIOR (Other) IMPLANT
SUTURE NYLON 10-0 AU-5  12IN (Suture) IMPLANT

## 2014-12-18 NOTE — OR Nursing (Signed)
OR To Postop Destination    Patient's level of consciousness: awake and comfortable    Patient transferred to: PACU  Transported via: eye bed  Transported by: circulator and CRNA  Head of bed: elevated  Oxygen delivered via: N/A  Monitored enroute: yes    Report given to: PACU RN -LUPE

## 2014-12-18 NOTE — Anesthesia Postprocedure Evaluation (Signed)
Patient: Paula Daugherty    PHACOEMULSIFICATION  IMPLANTATION INTRAOCULAR LENS (POSTERIOR CHAMBER)    Anesthesia Type: MAC    Vital Signs (Last Recorded):  BP: 132/62 mmHg  Pulse: 67  Resp: 12  Temp Max: 36.7 C (98.1 F)  (Last 24 hours)  Temp: 36.3 C (97.3 F)  SpO2: 95 % on    O2 Device: room air      Anesthesia Post Evaluation    Procedure: Procedure(s):PHACOEMULSIFICATIONIMPLANTATION INTRAOCULAR LENS (POSTERIOR CHAMBER)  Location: [facility] OR  Anesthesia: Monitored Anesthesia Care    Patient location during evaluation: PACU  Patient participation: complete - patient participated  Level of consciousness: awake and alert  Pain score: 0  Pain management: adequate  Airway patency: patent  Anesthetic complications: no  Cardiovascular status: blood pressure returned to baseline  Respiratory status: spontaneous ventilation, room air and nonlabored ventilation  Hydration status: euvolemic  Comments: Meets criteria for discharge.    Collins [place] Macauley Mossberg,MD  Attending Anesthesiologist  PI # [id number]  Pager: [phone]         Theda Belfast, MD          Theda Belfast, MD

## 2014-12-18 NOTE — Plan of Care (Signed)
Problem: Perioperative Period (Adult)  Goal: Signs and Symptoms of Listed Potential Problems Will be Absent or Manageable (Perioperative Period)  Signs and symptoms of listed potential problems will be absent or manageable by discharge/transition of care (reference Perioperative Period (Adult) CPG).   Outcome: Ongoing (interventions implemented as appropriate)

## 2014-12-18 NOTE — H&P (Signed)
H&P Update:    Ms. Riedinger is a 22yrfemale who presents for cataract surgery.    She reports no change in her health since her last visit.    ASSESSMENT: visually significant cataract left eye    PLAN: cataract surgery left eye    SKatheran Awe MD, MPH  PGY-2,  [facility]  Service Pager: [phone] Personal Pager: [phone]

## 2014-12-18 NOTE — OR Nursing (Signed)
OR Arrive    Patient's level of consciousness: awake and comfortable    Summary report reviewed: yes -PAULINE    Patient transported to OR via: eye bed  Transported by: CRNA  Head of bed: elevated  Oxygen delivered via: N/A  Monitored enroute: yes  Assumed care.

## 2014-12-18 NOTE — Anesthesia Preprocedure Evaluation (Addendum)
Anesthesia Evaluation    History of Present Illness  69 y/o female with cataract on left eye for PHACO with IOL  Airway   Mallampati: II  TM distance: >3 FB     Neck ROM is full. Dental - no notable dental history.    Pulmonary     Pulmonary ROS comment: Occasional seasonal allergies  Patient's breath sounds clear to auscultation. Cardiovascular   (+) hypertension (well controlled),  hypertriglyceridemia,     Rhythm: regular  Rate: normal  Patient has good exercise tolerance.   Neuro/Psych      Neuro/Psych ROS comment: Depression and anxiety    Fibromaylgia- lower back mostly   GI/Hepatic/Renal   (+) GERD (),        Endo/Other    (+) diabetes mellitus well controlled type 2, ,                      Anesthesia Plan    ASA 3     MAC     intravenous induction   Anesthetic plan and risks discussed with patient.  Resident/Fellow/CRNA discussed the plan with the attending.  I personally performed a physical assessment on this patient.

## 2014-12-18 NOTE — Procedures (Addendum)
SURGEON: Hessie Diener, MD    ASSISTANT: Katheran Awe, MD    PREOPERATIVE DIAGNOSIS:     Visually significant senile nuclear cataract, left eye     POSTOPERATIVE DIAGNOSIS:    Same.     PROCEDURE:    Phacoemulsification of cataract, implantation of posterior chamber intraocular lens, left eye  (83382)    TECHNIQUE:    The patient arrived at the [facility] and was identified by the attending surgeon. The operative eye was confirmed with the patient. An IV was started and the patient's operative eye was dilated and pre-treated with topical tetracaine. The patient was then brought to the operating suite and monitors were affixed for ECG, blood pressure, pulse oximetry, etc. A surgical pause was performed with all the OR staff participating. The power of the IOL was agreed upon by the resident and attending and noted on the board. The patient's operative eye was again treated with several drops of 1/2% tetracaine. The patient's operative eye was carefully prepped with Betadine and sterilely draped. A surgical pause was performed involving all members of the [facility] staff. The contra-lateral eye was shielded with a Fox shield. During the surgery, the patient received mild sedation under direction of the Anesthesia Department.     A nasal lid speculum was placed. A sideport incision was created with a "Super Sharp" blade. MPF lidocaine was injected into the anterior chamber. The anterior chamber was then filled with Viscoat. A clear corneal incision was created temporally with a 2.75 mm metal keratome blade. A 360-degree continuous curvilinear capsulorrhexis was then begun with a cystotome needle and completed with Utrata forceps. The nucleus was hydrodissected with balanced saline solution via an intraocular cannula with multiple good fluid waves noted.     The phacoemulsification instrument was then introduced into the eye and the tip buried into the nucleus. The nucleus was then chopped into two  hemispheres with the assistance of the [employer]. The nucleus was rotated 60 degrees with the first hemisphere engaged with the phacoemulsification tip. The MicroFinger was then used to chop the hemisphere into thirds, the fragments being brought forward into the anterior chamber and emulsified. The remaining hemisphere was disassembled in a similar manner.     The remaining cortex material was gently removed with the 45-degree tip irrigation and aspiration unit. The posterior capsule was gently polished with the IA tip and inspected and found to be completely intact. The capsular bag was deepened with ProVisc. An [employer] model SN-60WF posterior chamber lens of 22.5 diopteric power and serial number [id number], was loaded into the "C" cartridge of the Monarch injector system, the cartridge having been previously filled with viscoelastic. The injector was introduced into the eye and the injector tip advanced to introduce the lens into the capsular bag. The lens was inspected and was determined to be completely within the capsular bag and showed good centration.     The 45-degree IA tip was used to rotate the lens and remove any residual viscoelastic. The tip was placed behind the lens implant to insure no viscoelastic remained in the capsular bag. The corneal wound and side port were stromally hydrated with balanced saline and the wound checked and found to be watertight. The speculum and drape were gently removed and the eye treated with a drop of prednisilone acetate and Zymaxid. A clear shield was affixed to the eye and the patient returned to the postoperative area in excellent condition. The patient was carefully instructed in postoperative care  and emergency contact information.     COMPLICATIONS: None.    Dr. Eda Keys was present for the entire case from start to finish.    Katheran Awe, MD, MPH  PGY-2, [facility]  Service Pager: [phone]  Personal Pager:  [phone]      I was present for the entire procedure from start to finish.    Report electronically signed by:   Livingston Diones, MD   Professor  [facility]  Department of Ophthalmology

## 2014-12-18 NOTE — Nurse Discharge Note (Signed)
Discharge to home via wheelchair to private auto at [facility].   Belongings with patient and sent home with family.  See EMR for assessment.  Augie Vane D Coulton Schlink, RN

## 2014-12-19 ENCOUNTER — Other Ambulatory Visit: Payer: Self-pay | Admitting: Family Medicine

## 2014-12-19 ENCOUNTER — Ambulatory Visit: Payer: Medicare Other | Attending: Ophthalmology | Admitting: Ophthalmology

## 2014-12-19 DIAGNOSIS — Z9842 Cataract extraction status, left eye: Secondary | ICD-10-CM | POA: Insufficient documentation

## 2014-12-19 DIAGNOSIS — E1129 Type 2 diabetes mellitus with other diabetic kidney complication: Secondary | ICD-10-CM

## 2014-12-19 DIAGNOSIS — Z48810 Encounter for surgical aftercare following surgery on the sense organs: Secondary | ICD-10-CM | POA: Insufficient documentation

## 2014-12-19 DIAGNOSIS — Z961 Presence of intraocular lens: Secondary | ICD-10-CM

## 2014-12-19 MED ORDER — GLIPIZIDE ER 5 MG TABLET, EXTENDED RELEASE 24 HR
5.0000 mg | EXTENDED_RELEASE_CAPSULE | Freq: Every day | ORAL | Status: DC
Start: 2014-12-19 — End: 2015-02-14

## 2014-12-19 NOTE — Telephone Encounter (Signed)
Pt last seen by PCP 12/06/2014

## 2014-12-19 NOTE — Progress Notes (Addendum)
Paula Daugherty is a 69yrold female presents 1 day status post cataract surgery in the left eye. She reports no discomfort.    Assessment:  Post operative day one, status post cataract surgery in the left eye for distance, doing well  Cataract OD    Plan:  Discussed post-operative care in detail with patient.   Continue Vigamox, Voltaren and Prednisolone 4 times a day  Patient instructed to wear protective shield at night  Call immediately with any worsening of vision, worsening pain or increasing redness. Emergency contact information given.  Followup 1 week      ----------------------  Scribe Disclaimer: This note was scribed by EMallory Shirk a trained medical scribe, in the presence of JLivingston Diones M.D.     Provider Disclaimer: This document serves as my personal record of services taken in my presence. It was created on 12/19/2014 on my behalf by EMallory Shirk a trained medical scribe. I have reviewed this document and agree that this note accurately reflects the history and exam findings, the patient care provided, and my medical decision making.    12/19/2014  Report electronically signed by:  JLivingston Diones MD   Professor  [facility] Department of Ophthalmology    .

## 2014-12-20 ENCOUNTER — Other Ambulatory Visit: Payer: Self-pay

## 2014-12-21 ENCOUNTER — Encounter: Payer: Self-pay | Admitting: Family Medicine

## 2014-12-21 NOTE — Telephone Encounter (Signed)
From: [name]  To: [name], MD  Sent: 12/21/2014 9:16 AM PDT  Subject: Non-urgent Medical Advice Question    Hi dr, I had my 1st phone appointment yesterday with the nurse from the diabetic study I'm in. She asked that I contact you about my exhaustion. Is there any chance any of my meds could be causing it? I'm just so tired and want to sleep. Any thoughts you have will be helpful. Thanks. Eye surgery went very well.

## 2014-12-25 ENCOUNTER — Ambulatory Visit: Payer: Medicare Other | Attending: Dermatology | Admitting: Dermatology

## 2014-12-25 ENCOUNTER — Encounter: Payer: Self-pay | Admitting: Dermatology

## 2014-12-25 VITALS — BP 132/71 | HR 76 | Resp 16

## 2014-12-25 DIAGNOSIS — Z1283 Encounter for screening for malignant neoplasm of skin: Secondary | ICD-10-CM

## 2014-12-25 DIAGNOSIS — L918 Other hypertrophic disorders of the skin: Secondary | ICD-10-CM | POA: Insufficient documentation

## 2014-12-25 DIAGNOSIS — L821 Other seborrheic keratosis: Secondary | ICD-10-CM

## 2014-12-25 DIAGNOSIS — L570 Actinic keratosis: Secondary | ICD-10-CM

## 2014-12-25 NOTE — Patient Instructions (Signed)
[facility]  [address]  [phone]    Adequate sunscreening for everyday use may be different than needed for outdoor activities. For limited everyday exposure, a light, SPF 15 or greater lotion may be applied in the morning. Many make up foundations contain sunscreen that may perform this function such as Olay Complete SPF 30 and CeraVe am 30, which both contain Zinc.  For any significant outdoor activity, please use a more water-resistant, more protective sunscreen. There are many excellent types available. I recommend SPF 30 or greater, containing Zinc oxide. Zinc makes the sunscreen more effective in blocking the longer wavelength UV rays. Titanium dioxide and Parsol also block longer wavelength rays, but not quite as effectively in most preparations.   Some recommended products include:    ? Elta MD (available at [facility])  ? Colorescience (available at [facility])  ? Blue Lizard  ? Total Block  ? Vanicream 60  ? Spectra 3  ? Coppertone Sensitive Skin 50  ? Neutrogena Baby 60 spf    A newly approved long wavelength blocking agent, Mexoryl, has recently become available in a few combination preparations. Two types that are available on the internet are: Ombrelle and Anthelios  Some of the others listed are also found more easily on the internet or can be ordered by your pharmacist.   When using sunscreen please supplement Vit D3 1000-2000 IU Daily         [facility]    [address]. [phone]    Signs Of Melanoma   A: asymmetry, irregular over all shape  B: border irregularity, uneven (not smooth) border  C: color variation:  multiple colors or shades  D: diameter greater than 6mm (about pencil eraser size)  E: evolution, changes in any of the above     Of these, the least important to me is D. If a mole if very even and regular and not changing, the over all size is not as important. In addition, a smaller mole can be concerning if it has the other  features listed.             [facility]  [address], [address]  [phone]    Dry Skin Care      Take short, showers (avoid hot water; it takes off more natural oils)  Use a mild soap (eg. Dove, Cetaphil, Neutrogena)  No soap should be used in a bathtub because then soap will end up all over the body.     Use soap only on areas truly needed (underarms,groin,buttocks,fold areas, feet, face, hair)  Alternatives to soap include Aquanil and Cetaphil cleansers.  Pat off excess water and put moisturizer on immediately (within 3 min.) I prefer moisturizing creams (in tub or jar) over lotions.  Good choices include:       1. Cetaphil cream     2. Eucerin cream     3. Aveeno cream     4. Aquaphor ointment                 5. Vanicream    A cream should always be applied after showering or bathing, but may be applied as many additional times as is necessary.

## 2014-12-25 NOTE — Nursing Note (Signed)
Vital Signs taken, allergies verified, screened for pain, med hx taken. No acute distress noted at this time. L. Evelen Vazguez LVN

## 2014-12-25 NOTE — Progress Notes (Signed)
12/25/2014  Last Visit:     Paula Haring, MD  [address]  [facility]  [place] [address]      Dear Dr. Jamelle Daugherty:    I had the pleasure of seeing Paula Daugherty, a delightful 67yrfemale here for:  CC: evaluation of brown warty lesions over the trunk    Skin Cancer History:  None    Questionnaire dated 12/25/2014 reviewed by me today.    Review of Systems: The patient has been asked about current:  fever, fatigue, weight loss, night sweats, headache, chest pain, shortness of breath, visual changes, wheezing, cough, hay fever/pollen allergy, sore throat, bleeding gums, joint or muscle pain, lymph node swelling, infections, pains in calves, nausea, vomiting, diarrhea, burning with urination, depression/anxiety, numbness/tingling, or dizziness.  Positives are: cough, hay fever/pollen allergy, joint/muscle pain, depression/anxiety, dizziness. All other ROS are negative.     Past Medical History:   Patient Active Problem List    Diagnosis Date Noted    Cataract 12/18/2014    History of actinic keratoses 12/06/2014    Macroalbuminuric diabetic nephropathy 09/12/2014    Fatty liver 08/28/2014    Type 2 diabetes mellitus without complication 065/[id number]/4696   Cough 12/14/2013    Renal cyst ( 6.4 cm cyst left kidney) 08/24/2013    Hyperlipidemia with target LDL less than 70 07/28/2013    Vitamin D insufficiency 07/28/2013    Abnormal LFTs 07/28/2013    DM2 (diabetes mellitus, type 2) 07/11/2013    Depression with anxiety 07/11/2013    Stress at home 07/11/2013    Leg cramps-right calf 07/11/2013    Right ear pain 07/11/2013    Fibromyalgia 07/11/2013    HTN (hypertension) 07/11/2013    GERD (gastroesophageal reflux disease) 07/11/2013       Allergies: Contrast dye [radiopaque agent]; Hydrochlorothiazide; Januvia [sitagliptin]; Lyrica [pregabalin]; Omnipaque [iohexol]; Prozac [fluoxetine hcl]; Sulfa (sulfonamide antibiotics); and Topiramate     Medications:   Albuterol (PROAIR HFA, PROVENTIL  HFA, VENTOLIN HFA) 90 mcg/actuation inhaler, Take 1-2 puffs by inhalation every 4 hours if needed.  Amlodipine (NORVASC) 10 mg Tablet, TAKE 1 TABLET BY MOUTH EVERY DAY.  Aspirin 81 mg DR Tablet EC Tablet, Take 1 tablet by mouth every day.  Atorvastatin (LIPITOR) 10 mg Tablet, Take 1 tablet by mouth every day at bedtime.  Blood Sugar Diagnostic (ONETOUCH ULTRA TEST) Strips, Use for testing blood glucose 3 times per day  CALCIUM-MAGNESIUM-ZINC PO,   canagliflozin (INVOKANA) 100 mg Tablet, Take 1 tablet by mouth every day.  Cholecalciferol, Vitamin D3, (VITAMIN D-3) 2,000 unit Tablet, Take 1 tablet by mouth every day.  Diclofenac (VOLTAREN) 0.1 % Ophthalmic Solution, Instill 1 drop into the LEFT eye 4 times daily.  Escitalopram (LEXAPRO) 20 mg tablet, Take 1 tablet by mouth every day.  Fluconazole (DIFLUCAN) 150 mg Tablet, Take 1 tablet by mouth one time. May repeat after 3 days  Fluticasone (FLONASE) 50 mcg/actuation nasal spray, Instill 2 sprays into EACH nostril every day.  FLUTICASONE/SALMETEROL (ADVAIR DISKUS INHA), Take  by inhalation.  GlipiZIDE (GLUCOTROL XL) 5 mg SR Tablet, Take 1 tablet by mouth every morning with a meal.  Inhalational Spacing Device (AEROCHAMBER/FLOWSIGNAL) Spacer, To use with inhaler as directed  Losartan (COZAAR) 25 mg Tablet, Take 1 tablet by mouth every day.  Metformin (GLUCOPHAGE) 1,000 mg tablet, Take 1 tablet by mouth 2 times daily with meals. (diabetes)  Moxifloxacin (VIGAMOX) 0.5% Ophthalmic Solution, One drop 4 times a day in the left eye,  start 2 days before surgery  Omeprazole (PRILOSEC) 20 mg Delayed Release Capsule, TAKE ONE CAPSULE BY MOUTH EVERY DAY  Oxybutynin (DITROPAN) 5 mg Tablet, Take 5 mg by mouth 3 times daily.  Prednisolone Acetate (PRED FORTE, ECONOPRED PLUS) 1% Ophthalmic Suspension, One drop 4 times a day in the left eye, start 2 days before surgery    No current facility-administered medications for this visit.       Family History:                                              There is not a family history of skin cancer, type: N/A     Social History:  The patient does not smoke or use tobacco products.  EtOH Use: 1 /month  Occupation: Retired      Physical Examination:  A full body skin exam was done relating to moles/pigmented lesions, skin photodamage.    BP 132/71  Pulse 76  Resp 16  LMP 05/06/1983  On physical examination, the patient is well developed, well nourished, and pleasant with normal affect. The patient is alert to time, place, and person.    The following areas were inspected and found to be normal except as specified below:   Head/scalp including palpation, face, eyes, eyelids and conjunctiva, nose, mouth/oral mucosa, lips, neck including palpation, chest (including the breasts and axillae), abdomen, buttocks/groin, back, right and left upper extremities, digit, nail, and the right and left lower extremities, digits, and nails. Digits/nails were inspected and palpated (for clubbing, cyanosis, inflammation). The lower extremities were inspected for peripheral swelling, varicosities.   Lymph node basins were palpated in the neck.: No LAD    Lab/Imaging/Pathology reviewed by me today: N/A    Impression/Problems/Plan:    1. Dx: Seborrheic Keratosis over face, trunk, and extremities  HPI:  Lesions over face, trunk, and extremities have been present for years and have not changed.  Denies any rapid growth.  Reports developing new lesions over the years.  Has noticed some changes in texture and some have become darker.  Denies any bleeding or any other symptoms.    PE: multiple brown, verrucous, stuck-on papules with keratin pits  Plan: Benign nature of these skin lesions was explained to the patient. No treatment is indicated at this time. The patient has been educated about self examination, and if changes occur including change in size,color or if lesions become symptomatic, the patient was instructed to return for follow up.     2. Dx: Actinic Keratosis over  nasal bridge  HPI: Rough spots which have been present for years involving the nasal bridge. Symptoms include change in texture of the skin and are mild. Prior treatments include cryotherapy. .   PE: rough gritty papules   Plan: Liquid nitrogen was applied for 10-12 seconds to the skin lesion (1 total) and the expected blistering or scabbing reaction was explained. Patient was asked to not pick at the area. Patient was reminded to expect hypopigmented scars from the procedure. Return if lesion fails to fully resolve.    3. Dx: Acrochordons under eyes and neck  HPI: asymptomatic. Denies any bleeding or irritation  PE: Multiple pedunculated papules under eyes and neck  Plan: Benign nature of these skin lesions was explained to the patient. No treatment is indicated at this time. The patient has been asked to return for re-evaluation  if lesions become symptomatic.    4. Skin Malignancy Screening (risk factors: None )  -- Full skin exam completed today and should be repeated every 12 months (Next Due 12/2015 )  -- All findings are detailed above.   -- ABCD's of melanoma were reviewed and handout was given.   -- Daily sunscreen use was recommended and handout with recommendations were given.   -- Dry skin care handout was given.      Education needs were addressed and there were no barriers to learning.     The patient was seen and discussed with Dr. Alla German.    Patient was asked to return to clinic in: 1 year      -----------------------------  Gwenlyn Saran, MD  PGY-3 Resident  [employer]  Department of Dermatology

## 2014-12-26 ENCOUNTER — Ambulatory Visit: Payer: Medicare Other | Attending: Ophthalmology | Admitting: Ophthalmology

## 2014-12-26 DIAGNOSIS — Z48810 Encounter for surgical aftercare following surgery on the sense organs: Secondary | ICD-10-CM

## 2014-12-26 DIAGNOSIS — Z961 Presence of intraocular lens: Secondary | ICD-10-CM | POA: Insufficient documentation

## 2014-12-26 DIAGNOSIS — Z9842 Cataract extraction status, left eye: Secondary | ICD-10-CM

## 2014-12-26 NOTE — Progress Notes (Addendum)
Paula Daugherty is a 69yrold female who presents 1 week status post cataract surgery in the left eye. She reports no discomfort. Happy with vision, much more colorful. Currently wearing old pair of glasses, working well.    Assessment:  Post operative week one, status post cataract surgery in the left eye for distance, doing well  Cataract OD    Plan:  Discussed post-operative care in detail.  Discontinue Vigamox  Taper predinsolone acetate and Voltaren approximately one drop per week, written instructions given  Patient may discontinue protective shield at night  Call immediately with any worsening of vision, worsening pain or increasing redness. Emergency contact information given.    Followup 3-4 weeks for post op month one and preoperative evaluation for right eye      ----------------------  Scribe Disclaimer: This note was scribed by EMallory Shirk a trained medical scribe, in the presence of JLivingston Diones M.D.     Provider Disclaimer: This document serves as my personal record of services taken in my presence. It was created on 12/26/2014 on my behalf by EMallory Shirk a trained medical scribe. I have reviewed this document and agree that this note accurately reflects the history and exam findings, the patient care provided, and my medical decision making.    12/26/2014  Report electronically signed by:  JLivingston Diones MD   Professor  [facility]    .

## 2014-12-26 NOTE — Progress Notes (Signed)
This patient was seen, evaluated, and care plan was developed with the resident. I agree with the findings and plan as outlined in the resident's note. I was present during the entire procedure.    Temia Debroux, MD  [place] [place]  Department of Dermatology

## 2015-01-01 ENCOUNTER — Other Ambulatory Visit: Payer: Self-pay

## 2015-01-01 NOTE — Progress Notes (Signed)
Health Management and Education     Reviewed the pertinent portion of patients chart. Sent mychar5t message for Patient, requesting return call to schedule phone appointment. Awaiting Call back.       Electronically signed by   Keturah Barre, [place] Work, Care Manager   Health Management and Education  [phone]

## 2015-01-02 ENCOUNTER — Encounter: Payer: Self-pay | Admitting: "Endocrinology

## 2015-01-02 ENCOUNTER — Ambulatory Visit: Payer: Medicare Other | Admitting: "Endocrinology

## 2015-01-02 VITALS — BP 128/71 | HR 71 | Temp 98.0°F | Resp 16 | Wt 180.7 lb

## 2015-01-02 DIAGNOSIS — E119 Type 2 diabetes mellitus without complications: Secondary | ICD-10-CM

## 2015-01-02 DIAGNOSIS — E1165 Type 2 diabetes mellitus with hyperglycemia: Secondary | ICD-10-CM

## 2015-01-02 NOTE — Progress Notes (Signed)
Endocrinology Follow up clinic note:    Chief Complaint:  "I'm here to follow up on my daibetes."    HPI: Paula Daugherty is a 69yrold female who has a diagnosis of type 2 diabetes mellitus who presents to Endocrinology for follow up. Her history is as follows:  She was initially diagnosed with diabetes around 2000 on routine labs. She has a strong family history of diabetes so she wasn't surprised at this. She was started on Metformin around 2005 and then she was started on insulin in 2014. She was up to 64 units of Lantus at night. However, after making changes to her diet and lifestyle, she was taken off insulin in 2015.       She also has a history of an elevated alk phos level. A GGT level was checked and was found to be elevated. A bone specific alkaline phosphatase was also found to be elevated. PTH level was normal.      Interim History: She returns today for follow up. Her last visit with Endocrinology was on 08/22/2014. At this visit, her glipizide dose was decreased as she was having more frequently hypoglycemia. . Since this time, she states that she has been doing ok but she continues to be under a lot of stress as her mother is in poor health and [place]   Overall, she thinks her blood glucose is doing ok. She had stopped glipizide previously for some increased hypoglycemia but lately her blood glucose has been running higher so about 1 week ago, she restarted the glipizide. She states that the most difficult for thing to control is her eating habits and she has been feeling tired all of the time and this has made it difficult for her to exercise.     Of note, her son was recently diagnosed with type 1 diabetes after he presented with weight loss, polyuria and polydipsia.     Current Regimen:    Metformin 1000 mg BID   Invokana 100 mg daily   Glipizide 5 mg daily  Hypoglycemia: None recently  Hyperglycemia: Yes, has been running higher in  the past few weeks  Meter brought today: Yes  Checks blood glucose: 2x/day  Home blood glucose numbers per pt's log:   Blood Glucose:     Pre-breakfast: 144 to 297     Bedtime: 114 to 198  Diet and Exercise:     Breakfast: Sometimes will skip, cereal with milk and banana, coffee (with cream) or hot tea to drink   Lunch: Will eat out most days - Deli sandwiches, pasta, burger   Dinner: Tries to have smaller dinner - shrimp or pasta   Snacks: Will snack most often at bedtime - fruit, sometimes has ice cream or pie if its in the house   Exercise: Very minimal these days due to fatigue  Last eye exam: 04/2014, no retinopathy, f/u 1 year, patient has been following closely with ophthalmology lately due to cataract surgery  Foot exam: Will do today    ROS:  All other systems were reviewed and are negatative except for pertinent positive and negative responses as documented in HPI.     Medications:  Medication reconciliation was performed today.   Current Outpatient Prescriptions on File Prior to Visit   Medication Sig Dispense Refill    Albuterol (PROAIR HFA, PROVENTIL HFA, VENTOLIN HFA) 90 mcg/actuation inhaler Take 1-2 puffs by inhalation every 4 hours  if needed. 1 inhaler 1    Amlodipine (NORVASC) 10 mg Tablet TAKE 1 TABLET BY MOUTH EVERY DAY. 90 tablet 1    Aspirin 81 mg DR Tablet EC Tablet Take 1 tablet by mouth every day. 30 tablet 5    Atorvastatin (LIPITOR) 10 mg Tablet Take 1 tablet by mouth every day at bedtime. 90 tablet 3    Blood Sugar Diagnostic (ONETOUCH ULTRA TEST) Strips Use for testing blood glucose 3 times per day 300 strip 3    CALCIUM-MAGNESIUM-ZINC PO       canagliflozin (INVOKANA) 100 mg Tablet Take 1 tablet by mouth every day. 90 tablet 3    Cholecalciferol, Vitamin D3, (VITAMIN D-3) 2,000 unit Tablet Take 1 tablet by mouth every day.      Diclofenac (VOLTAREN) 0.1 % Ophthalmic Solution Instill 1 drop into the LEFT eye 4 times daily. 5 mL 3    Escitalopram (LEXAPRO) 20 mg tablet Take 1  tablet by mouth every day. 90 tablet 3    Fluconazole (DIFLUCAN) 150 mg Tablet Take 1 tablet by mouth one time. May repeat after 3 days 2 tablet 0    Fluticasone (FLONASE) 50 mcg/actuation nasal spray Instill 2 sprays into EACH nostril every day. 16 g 1    FLUTICASONE/SALMETEROL (ADVAIR DISKUS INHA) Take  by inhalation.      GlipiZIDE (GLUCOTROL XL) 5 mg SR Tablet Take 1 tablet by mouth every morning with a meal. 30 tablet 11    Inhalational Spacing Device (AEROCHAMBER/FLOWSIGNAL) Spacer To use with inhaler as directed 1 each 1    Losartan (COZAAR) 25 mg Tablet Take 1 tablet by mouth every day. 90 tablet 3    Metformin (GLUCOPHAGE) 1,000 mg tablet Take 1 tablet by mouth 2 times daily with meals. (diabetes) 180 tablet 3    Moxifloxacin (VIGAMOX) 0.5% Ophthalmic Solution One drop 4 times a day in the left eye, start 2 days before surgery 3 mL 1    Omeprazole (PRILOSEC) 20 mg Delayed Release Capsule TAKE ONE CAPSULE BY MOUTH EVERY DAY 90 capsule 1    Oxybutynin (DITROPAN) 5 mg Tablet Take 5 mg by mouth 3 times daily.      Prednisolone Acetate (PRED FORTE, ECONOPRED PLUS) 1% Ophthalmic Suspension One drop 4 times a day in the left eye, start 2 days before surgery 15 mL 2     No current facility-administered medications on file prior to visit.      I did review patient's past medical and family/social history, no changes noted.   PMH:  Past medical history was reviewed from problem list.   Patient Active Problem List   Diagnosis    DM2 (diabetes mellitus, type 2)    Depression with anxiety    Stress at home    Leg cramps-right calf    Right ear pain    Fibromyalgia    HTN (hypertension)    GERD (gastroesophageal reflux disease)    Hyperlipidemia with target LDL less than 70    Vitamin D insufficiency    Abnormal LFTs    Renal cyst ( 6.4 cm cyst left kidney)    Cough    Type 2 diabetes mellitus without complication    Fatty liver    Macroalbuminuric diabetic nephropathy    History of actinic  keratoses    Cataract     VITAL SIGNS:  BP 128/71  Pulse 71  Temp 36.7 C (98 F) (Tympanic)  Resp 16  Wt 82 kg (180 lb 11.2 oz)  LMP 05/06/1983  SpO2 97%  BMI 33.05 kg/m2  Body mass index is 33.05 kg/(m^2).    PHYSICAL EXAM:  General Appearance: healthy, alert, no distress, pleasant affect, cooperative.  Eyes:  conjunctivae and corneas clear. PERRL, EOM's intact. sclerae normal.  Mouth: normal.  Neck:  Neck supple. No adenopathy, thyroid symmetric, normal size.  Heart:  normal rate and regular rhythm, no murmurs, clicks, or gallops.  Lungs: clear to auscultation.  Abdomen: BS normal.  Abdomen soft, non-tender.  No masses or organomegaly.  Extremities:  no cyanosis, clubbing, or edema.  Foot Exam: normal DP and PT pulses, no trophic changes or ulcerative lesions and normal sensory exam.  Skin:  Skin color, texture, turgor normal. No rashes or lesions.  Neuro: Gait normal. No tremor appreciated Sensation and strength grossly normal.  Mental Status: Appearance/Cooperation: in no apparent distress and well developed and well nourished  Eye Contact: normal  Speech: normal volume, rate, and pitch    LAB TESTS/STUDIES:   I personally reviewed the following laboratory studies.     04/10/2014 15:05 06/07/2014 09:37 08/22/2014 08:15 12/06/2014 11:45   SODIUM 139 142 141    POTASSIUM 3.6 3.8 4.2    CHLORIDE 101 102 103    CARBON DIOXIDE TOTAL 27 30 25     UREA NITROGEN, BLOOD (BUN) 29 (H) 14 17    CREATININE BLOOD 1.21 0.85 0.82    E-GFR, AFRICAN AMERICAN 54 (L) >60 >60    E-GFR, NON-AFRICAN AMERICAN 45 (L) >60 >60    GLUCOSE 116 (H) 118 (H) 109 (H)    CALCIUM 9.8 9.5 9.5    PROTEIN 7.8  7.7    ALBUMIN 3.9  3.9    ALKALINE PHOSPHATASE (ALP) 108  97    ASPARTATE TRANSAMINASE (AST) 57 (H)  40    BILIRUBIN TOTAL 1.0  0.8    ALANINE TRANSFERASE (ALT) 62 (H)  38    URIC ACID, BLOOD   5.7    FASTING   YES    CHOLESTEROL   165    TRIGLYCERIDE   134    LDL CHOLESTEROL CALCULATION   87    HDL CHOLESTEROL   51    NON-HDL CHOLESTEROL   114     TOTAL CHOLESTEROL:HDL RATIO   3.2    HGB A1C 7.6 (H)  6.7 (H) 6.9 (H)   HGB A1C,GLUCOSE EST AVG 171  146 151      12/06/2014 11:45   WHITE BLOOD CELL COUNT 8.7   HEMOGLOBIN 13.6   HEMATOCRIT 42.0   MCV 83.1   RDW 14.8 (H)   PLATELET COUNT 234      12/08/2013 10:30 02/21/2014 13:52   CREATININE SPOT URINE 225.81 530.26   MICROALBUMIN URINE 13.4 176.4   MICROALBUMIN/CREATININE RATIO 59 (H) 333 (H)       12/08/2013 10:30    GAMMA GLUTAMYL TRANSFERASE(GGT  96 (H)       Impression: This is a 69yrold female with Diabetes Mellitus Type II who presents to Endocrinology for follow up. Overall, her glycemic control is at goal as based on her most recent A1c of 6.9%. Review of her blood glucose meter download today demonstrates that she has higher blood glucose readings in the morning time and I suspect that this is due to her (often high carbohydrate) snack at bedtime. We discussed the goal of decreasing bedtime snacking and low carbohydrate/higher protein snack options today. I have not recommended any changes to her medications today and she was encouraged  to continue monitoring her blood glucose closely with the goal of increasing monitoring later in the day.  She also has a history of an elevated alk phos level. GGT was elevated which suggests a liver source and she was found to have an enlarged liver with increased echogenicity suggestive of fatty infiltration on abdominal ultrasound in 12/2013. Her last alk phos  In 08/2014 was in the normal range.      DIABETES HISTORY  (-) h/o retinopathy.      (-) h/o microalbuminuria/nephropathy.  (-) h/o neuropathy.  (-) h/o Autonomic dysfunction  (-) h/o Gastroparesis  (-) h/o CAD  (-) h/o PVD    Last eye exam: 04/2014, f/u 1 year    Last urine microalbumin: 02/2014, 333  Flu vaccination: 02/2014  (+) ASA  (+) Statin  (+) ACE/ARB            Recommendations:  (E11.29) Type 2 diabetes mellitus with other diabetic kidney complication  (primary encounter diagnosis)  - No changes made to  metformin and Invokana doses today  - Continue glipizide XR to 5 mg daily(patient just restarted ~ 1 week ago)  - Encouraged patient to decrease snacking at night and to choose lower carbohydrate snacks  - Encouraged patient to continue close monitoring of blood glucose - goal at least 2x/day  - Last LDL at goal, continue statin, repeat fasting lipid panel due 08/2015  - Blood pressure at goal, continue amlodipine, cozaar    - Last microalbumin:creatinine ratio elevated, continue cozaar  - Up to date on influenza vaccine  - Up to date on screening eye exam, next due 04/2015, f/u with ophthalmology as scheduled    Approximately 25 minutes were spent with patient, greater than 50% of which was spent counseling the patient on diabetes and on coordination of care.     Follow up in 2 months    If you have any questions, please do not hesitate to contact me at [phone].  Thank you for allowing me to participate in the care of this patient.    EDUCATION:  I educated/instructed the patient or caregiver regarding all aspects of the above stated plan of care.  The patient or caregiver indicated understanding.      [place] interpreter was not used.    Report electronically signed by:  Sunday Spillers, M.D.  Clinical Assistant Professor  Department of Endocrinology  Pager # [phone]

## 2015-01-02 NOTE — Patient Instructions (Signed)
1. Please have non-fasting blood and urine tests done before follow up appointment    2. No changes to medications today    3. Return to clinic in 2-3 months

## 2015-01-02 NOTE — Nursing Note (Signed)
Patient verified by name and date of birth.   Vital signs assessed, allergies verified, screened for pain and verified pharmacy.   Odette Watanabe Banducci [place], MA

## 2015-01-09 ENCOUNTER — Encounter: Payer: Self-pay | Admitting: "Endocrinology

## 2015-01-12 ENCOUNTER — Ambulatory Visit: Payer: Medicare Other

## 2015-01-12 ENCOUNTER — Ambulatory Visit: Payer: Medicare Other | Attending: GASTROENTEROLOGY | Admitting: Gastroenterology

## 2015-01-12 VITALS — BP 133/61 | HR 62 | Temp 97.7°F | Wt 184.0 lb

## 2015-01-12 DIAGNOSIS — K7581 Nonalcoholic steatohepatitis (NASH): Secondary | ICD-10-CM

## 2015-01-12 NOTE — Progress Notes (Signed)
MR#: [id number]   DOB: November 18, 1945   DOS: 01/12/2015      Paula Daugherty is a 82yry/o female with PMH of HTN, HLD, DM2, obesity (BMI 33), depression, fibromyalgia, and GERD who is referred to hepatology clinic for further evaluation of NASH. Pt had abdominal ultrasound in 2015 and earlier this year which showed hepatomegaly and increased echogenicity suggestive of hepatic steatosis. There was no evidence of cirrhosis; portal vein was mildly dilated (13 mm) but with normal flow; no splenomegaly; no nodular hepatic contour. Liver biochemistry panel in April of this year was normal.     Pt denies significant alcohol intake; she drinks a glass of wine 1-2 times per week. She denies hx of illicit drug use. She is HBV immune and HCV negative. No fhx of liver disease. No personal or family hx of autoimmune disease. No new medications in the last 2-3 years. Pt notes exposure to hepatitis many years ago. She states that she developed a febrile illness with jaundice for which she was hospitalized for a few days; symptoms subsequently resolved. 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     Currently, pt endorses significant fatigue. She denies jaundice, icterus, confusion, abdominal distention, leg edema, melena, hematochezia, and fevers. She notes occasional diffuse abdominal pain without nausea or emesis.       ROS:   All systems reviewed and are otherwise negative unless noted in the HPI.      Allergies:   Allergies   Allergen Reactions    Contrast Dye [Radiopaque Agent] Hives    Hydrochlorothiazide Other-Reaction in Comments     Patient reports    Januvia [Sitagliptin] Other-Reaction in Comments     Patient reports    Lyrica [Pregabalin] Other-Reaction in Comments     Patient reports    Omnipaque [Iohexol] Other-Reaction in Comments     Patient reports    Prozac [Fluoxetine Hcl] Other-Reaction in Comments     Patient reports    Sulfa (Sulfonamide Antibiotics) Rash    Topiramate  Other-Reaction in Comments     Patient reports           Medications:   Current Outpatient Prescriptions   Medication Sig    Albuterol (PROAIR HFA, PROVENTIL HFA, VENTOLIN HFA) 90 mcg/actuation inhaler Take 1-2 puffs by inhalation every 4 hours if needed.    Amlodipine (NORVASC) 10 mg Tablet TAKE 1 TABLET BY MOUTH EVERY DAY.    Aspirin 81 mg DR Tablet EC Tablet Take 1 tablet by mouth every day.    Atorvastatin (LIPITOR) 10 mg Tablet Take 1 tablet by mouth every day at bedtime.    Blood Sugar Diagnostic (ONETOUCH ULTRA TEST) Strips Use for testing blood glucose 3 times per day    CALCIUM-MAGNESIUM-ZINC PO     canagliflozin (INVOKANA) 100 mg Tablet Take 1 tablet by mouth every day.    Cholecalciferol, Vitamin D3, (VITAMIN D-3) 2,000 unit Tablet Take 1 tablet by mouth every day.    Diclofenac (VOLTAREN) 0.1 % Ophthalmic Solution Instill 1 drop into the LEFT eye 4 times daily.    Escitalopram (LEXAPRO) 20 mg tablet Take 1 tablet by mouth every day.    Fluconazole (DIFLUCAN) 150 mg Tablet Take 1 tablet by mouth one time. May repeat after 3 days    Fluticasone (FLONASE) 50 mcg/actuation nasal spray Instill 2 sprays into EACH nostril every day.    FLUTICASONE/SALMETEROL (ADVAIR DISKUS INHA) Take  by inhalation.    GlipiZIDE (GLUCOTROL XL) 5 mg  SR Tablet Take 1 tablet by mouth every morning with a meal.    Inhalational Spacing Device (AEROCHAMBER/FLOWSIGNAL) Spacer To use with inhaler as directed    Losartan (COZAAR) 25 mg Tablet Take 1 tablet by mouth every day.    Metformin (GLUCOPHAGE) 1,000 mg tablet Take 1 tablet by mouth 2 times daily with meals. (diabetes)    Moxifloxacin (VIGAMOX) 0.5% Ophthalmic Solution One drop 4 times a day in the left eye, start 2 days before surgery    Omeprazole (PRILOSEC) 20 mg Delayed Release Capsule TAKE ONE CAPSULE BY MOUTH EVERY DAY    Oxybutynin (DITROPAN) 5 mg Tablet Take 5 mg by mouth 3 times daily.    Prednisolone Acetate (PRED FORTE, ECONOPRED PLUS) 1%  Ophthalmic Suspension One drop 4 times a day in the left eye, start 2 days before surgery     No current facility-administered medications for this visit.          Past Medical History: HTN, HLD, DM2, GERD, depression, fibromyalgia     Past Surgical History:   Past Surgical History   Procedure Laterality Date    Pr ligate fallopian tube       Tubal ligation    Pr total abdom hysterectomy  1980s     partial; no BSO    Pr knee scope,diagnostic  1980s     Knee arthroscopy    Pr repair tympanic membrane       Tympanoplasty    Colonoscopy  01/04/14     polyp--TA and erosion; hemorrhoids; tics; repeat x 3 yrs    Reduction breast  1985        Family History:   Family History   Problem Relation Age of Onset    Diabetes Father     Heart Mother      MI at 24s     Non-contributory Brother     Non-contributory Brother        Social History:   Social History     Social History    Marital status: MARRIED     Spouse name: N/A    Number of children: 1    Years of education: N/A     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[employer]       Social History Main Topics    Smoking status: Former Smoker     Packs/day: 0.10     Years: 20.00    Smokeless tobacco: Never Used      Comment: quit 30 years ago    Alcohol use 0.0 oz/week     0 Standard drinks or equivalent per week      Comment: rare shares a beer or glass wine every 2-3 months     Drug use: No    Sexual activity: Yes     Partners: Male     Other Topics Concern    Not on file     Social History Narrative          Blood pressure 133/61, pulse 62, temperature 36.5 C (97.7 F), temperature source Temporal, weight 83.5 kg (184 lb), last menstrual period 05/06/1983.    Physical Exam:   GENERAL: Non toxic appearing female in no acute distress.   HEENT: Normocephalic, atraumatic. Anicteric sclera, clear conjunctiva.   CARDIAC: Normal rate, regular rhythm. No murmurs, rubs or gallops.   LUNGS: CTAB, no rhonchi, wheezing or diminished breath sounds. Good air  movement with normal diaphragmatic excursion.   ABDOMEN: Soft, non distended.  No fluid wave or shifting dullness. Tender to palpation in RUQ without rebound or guarding. Normoactive bowel sounds. No organomegaly appreciated, though liver edge difficult to palpate due to abdominal girth. No palpable abdominal masses.   EXTREMITIES: Trace LE edema. Peripheral pulses intact.   NEUROLOGICAL: A&Ox3. No hippus, no asterixis, no clonus.   SKIN: No jaundice. No palmar erythema. No spider telangiectasias.     Last Labs:     CBC:   Lab Results   Lab Name Value Date/Time    WBC 8.7 12/06/2014 11:45 AM    HGB 13.6 12/06/2014 11:45 AM    HCT 42.0 12/06/2014 11:45 AM    PLT 234 12/06/2014 11:45 AM         LFT:   Lab Results   Lab Name Value Date/Time    AST 40 08/22/2014 08:15 AM    ALT 38 08/22/2014 08:15 AM    ALP 97 08/22/2014 08:15 AM    ALB 3.9 08/22/2014 08:15 AM    TP 7.7 08/22/2014 08:15 AM    TBIL 0.8 08/22/2014 08:15 AM       BCP:   Lab Results   Lab Name Value Date/Time    NA 141 08/22/2014 08:15 AM    K 4.2 08/22/2014 08:15 AM    CL 103 08/22/2014 08:15 AM    CO2 25 08/22/2014 08:15 AM    BUN 17 08/22/2014 08:15 AM    CR 0.82 08/22/2014 08:15 AM    GLU 109 (H) 08/22/2014 08:15 AM     HCV Ab: non reactive (12/2013)  HBVsAb: positive (12/2013)  HBVsAg: negative (12/2013)    HbA1C: 6.9 (12/2014)      Abdominal Ultrasound:   08/22/14  IMPRESSION:    1. ENLARGED DIFFUSELY ECHOGENIC LIVER COMPATIBLE WITH HEPATIC STEATOSIS.  2. MILDLY DILATED MAIN PORTAL VEIN, SUGGESTIVE OF PORTAL HYPERTENSION.  3. BILATERAL RENAL PARENCHYMAL THINNING.  4. LEFT KIDNEY CYST WITH INCOMPLETE SEPTA AND PUNCTATE CALCIFICATION,  PROBABLY BENIGN.        Assessment and Plan: Paula Daugherty is a 69yrold female with HTN, HLD, DM2, and obesity who presents for further evaluation of hepatic steatosis noted on abdominal ultrasound. She does not have hx of significant alcohol intake and she likely has NASH secondary to obesity and metabolic syndrome. Other  potential causes for hepatic steatosis must be excluded, such as hematochromatosis and autoimmune hepatitis. She does not have evidence of chronic viral hepatitis. She does note prior exposure to hepatitis, which based on hx sounds like acute hepatitis A virus.  She appears to be HBV immune but will rule out prior exposure to hepatitis B virus. She is not on any medications that are known to cause hepatic steatosis (typically amiodarone, MTX, tamoxifen, corticosteroids, valproic acid, or anti-retrovirals).     Pt does not appear to have any stigmata of chronic liver disease on exam and liver synthetic function appears normal on recent labs. APRI score is 0.398 and Fib4 score is 1.86 which are both equivocal. Cannot completely exclude underlying fibrosis but she currently does not have clinical evidence of cirrhosis.     We discussed management of NASH and NAFLD which mainly focus on diet and exercise aimed at weight loss. I let Mrs. MZambranaknow that there is potential for reversal of steatosis with weight loss, though a significant amount of weight loss is usually necessary (at lest 3-5%). We discussed some lifestyle measures in the office but I encouraged her to follow-up with her primary care provider to discuss  weight loss further and to discuss referral to a dietitian. Would not recommend vitamin E supplementation at this time as this has only been shown to be beneficial for NASH in non-diabetic patients. I let Mrs. Brott know that there are currently several clinical trials looking at different medications aimed at treating NASH, and these may be considered down the line if there is suggestion of worsening liver function.     #1 Non alcoholic fatty liver disease   - Iron level  - Ferritin  - Transferrin  - ANA  - Anti-mitochondrial antibody   - Anti-smooth muscle antibody   - HBVcAb  - Consider MRE to further evaluate for underlying fibrosis   - Follow-up with primary care provider to discuss lifestyle  measures aimed at weight loss  - PCP to consider referral to dietitian               This plan was developed with Dr. Ancil Boozer, who agrees with the above plan.       Lanier Prude, DO (PGY-4  Clinical Fellow)  [facility]   Department of Internal Medicine  Division of Gastroenterology and Hepatology   Pager: [phone]

## 2015-01-12 NOTE — Nursing Note (Signed)
Patient is in room #4    Medication list given at the time of check in for review by patient, instructed patient to indicate to the doctor any corrections that need to be made for Medication Reconciliation.    Patient identifiers obtained. vital signs taken, screened for pain, allergies checked, patient roomed, and Lanier Prude, DO notified.     Jethro Poling, MA

## 2015-01-12 NOTE — Addendum Note (Signed)
Addended by: Melford Aase on: 01/12/2015 02:11 PM     Modules accepted: Orders, Level of Service

## 2015-01-12 NOTE — Progress Notes (Signed)
Attending addendum  I have discussed the patient with Dr. Morley Kos. Please see fellow's note for details. I agree with Dr. Morley Kos findings and plans we developed together as written.      Melford Aase, MD  Assistant Professor  Division of Gastroenterology and Hepatology  Pager: [phone]

## 2015-01-16 ENCOUNTER — Other Ambulatory Visit: Payer: Self-pay

## 2015-01-16 ENCOUNTER — Encounter: Payer: Self-pay | Admitting: GASTROENTEROLOGY

## 2015-01-16 NOTE — Progress Notes (Signed)
[facility] NURSE COACH DOCUMENTATION    [facility] Nurse Coaching SUBSEQUENT ENCOUNTER    Date: 01/16/15  Encounter Start: 1416   Encounter End: 1446    Previous SMART Goal:     Achieve 4,000 steps, 5 days per week   Sweets down to 2 nights a week    Goal Success (Paula self-rated):   PRIMARY GOAL: Physical Activity Scale: Sometimes (31-60%)    SECONDARY GOAL: Nutrition Scale:  Sometimes (31-60%)     Individual peanut butters, celery, made chicken salad. Fresh fruit. Made things easy.     Frequency of Engagement with Technology Device:    Wearing Device: 100% time    Utilizing Applications: 0 days per week     Paula Identified Barriers:    Other (please specify) Sleepiness    Facilitators Identified:    Other (please specify) easy access to snacks    Assessment of Goals from 'Paula Daugherty' (clinician rated)   PRIMARY GOAL: Physical Activity  Scale:Rarely (10-30%)    Upcoming SMART goal for next 2 weeks:      Paula Paula Daugherty as of 01/16/2015 at 2:29 PM      [facility] - Physical Activity      Step Goals: Achieve 4,000 steps 5 days per week.     [facility] - Nutrition      Nutrition:  Change snack choices - sweets down to 2 nights a week.        Paula Identified Potential Barriers:    Decreased motivation    Paula Rated Behavior Change Importance Score (1-10): 8  Paula Rated Behavior Change Confidence Score (1-10): 6    Other reflections/comments:   Paula assessment of goal success: Was not feeling well the last few weeks which impacted energy level to get out walking.    Solutions to overcome barriers: Will set alarm on phone at 8 am to go walking.  Having healthier snacks available and spending some time prepping healthier foods has been helpful as well.     Next telephonic follow-up:   Paula Outreach     No outreach performed during this encounter          Paula ENGAGEMENT/CARE COORDINATION  Administrative Data for Electronic Health Record    DISCIPLINES: REGISTERED  NURSE    INTERVENTION TYPE    MOTIVATIONAL INTERVIEWING and GOAL SETTING: Paula Goal    Paula GENERATED SENSOR DATA: PHYSICAL ACTIVITY    LEVEL OF ENGAGEMENT: TELEPHONE CALL: Paula Intervention/Interaction    LENGTH OF INTERACTION: 30 minutes.    Electronically signed by:   Anette Riedel, RN  [place] Coach  [place]   [phone]

## 2015-01-16 NOTE — Progress Notes (Unsigned)
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

## 2015-01-22 ENCOUNTER — Encounter: Payer: Self-pay | Admitting: Family Medicine

## 2015-01-22 NOTE — Telephone Encounter (Signed)
Dr.Jaeger would like to see the patient this week, only same day or urgents available. Would it be ok to use afternoon urgent tomorrow?  Please call to advise.

## 2015-01-22 NOTE — Telephone Encounter (Signed)
From: [name]  To: [name], MD  Sent: 01/22/2015 12:03 PM PDT  Subject: Non-urgent Medical Advice Question    Hi Dr, I am writing to tell you I have been sick since last Thursday. At first I thought it was just a cold but it has progressed. I now have a productive cough of yellow sputum. I seem to get bronchitis pretty easy so wanted to know if I should get an antibiotic now. Plus before getting sick the nurse with diabetes study I'm in wanted me to contact you about my sleeping. I sleep 8-10 at night, 2-4 hr afternoon naps and a nap in the mornings sometimes. Dr Rosary Lively wanted to know if the Lexapro could be up? Stress level above roof. Guess everyone thinks I'm one hot mess. Thanks for listening.

## 2015-01-23 ENCOUNTER — Encounter: Payer: Self-pay | Admitting: Family Medicine

## 2015-01-23 ENCOUNTER — Ambulatory Visit: Payer: Self-pay

## 2015-01-23 NOTE — Telephone Encounter (Signed)
Currently scheduled for 11/2.

## 2015-01-23 NOTE — Telephone Encounter (Signed)
Yes please

## 2015-01-23 NOTE — Telephone Encounter (Signed)
From: [name]  To: [name], MD  Sent: 01/23/2015 9:38 AM PDT  Subject: Non-urgent Medical Advice Question    GM, wanted you to know I'm feeling a little better so maybe not bronchitis. Given that and the Lexapro being maxed. I guess I don't need to come in. I had called about an appointment, you had nothing. They were supposed to call back but didn't. Thanks anyway. See you at next check.

## 2015-01-24 NOTE — Telephone Encounter (Signed)
Paula Daugherty will contact patient with CPT codes.  Clide Dales, RN

## 2015-01-25 ENCOUNTER — Encounter: Payer: Self-pay | Admitting: Ophthalmology

## 2015-01-25 ENCOUNTER — Ambulatory Visit: Payer: Medicare Other | Attending: Ophthalmology | Admitting: Ophthalmology

## 2015-01-25 DIAGNOSIS — Z9842 Cataract extraction status, left eye: Secondary | ICD-10-CM | POA: Insufficient documentation

## 2015-01-25 DIAGNOSIS — Z01818 Encounter for other preprocedural examination: Secondary | ICD-10-CM | POA: Insufficient documentation

## 2015-01-25 DIAGNOSIS — Z961 Presence of intraocular lens: Secondary | ICD-10-CM | POA: Insufficient documentation

## 2015-01-25 DIAGNOSIS — H2511 Age-related nuclear cataract, right eye: Secondary | ICD-10-CM

## 2015-01-25 MED ORDER — MOXIFLOXACIN 0.5 % EYE DROPS
1.0000 [drp] | Freq: Four times a day (QID) | OPHTHALMIC | 1 refills | Status: AC
Start: 2015-01-25 — End: 2015-04-16

## 2015-01-25 MED ORDER — PREDNISOLONE ACETATE 1 % EYE DROPS,SUSPENSION
1.0000 [drp] | Freq: Four times a day (QID) | OPHTHALMIC | 1 refills | Status: AC
Start: 2015-01-25 — End: 2015-04-21

## 2015-01-25 MED ORDER — DICLOFENAC 0.1 % EYE DROPS: 1.0000 [drp] | Freq: Four times a day (QID) | OPHTHALMIC | 3 refills | Status: AC

## 2015-01-25 NOTE — Progress Notes (Signed)
Paula Daugherty  Post-Operative Note    HPI: Paula Daugherty is a 69yrold female s/p CEIOL left eye, here for 1 month f/u. Pt reports she is doing well, no issues to report, happy with vision.     She also presents for pre-op evaluation for cataract surgery in the right eye. Patient continues to report blurry vision that is disruptive to her daily activities, and notes particular trouble reading and driving. No other ocular complaints.    Patient states overall health is good, although she does have a recent cold. On review of symptoms no recent dyspnea, chest pain, palpitations, or focal neurologic symptoms.       POH:   CE/IOL OS Dr. CEda Keys8/15/2016  Cataract OD    Meds:  Current Outpatient Prescriptions on File Prior to Visit   Medication Sig Dispense Refill    Albuterol (PROAIR HFA, PROVENTIL HFA, VENTOLIN HFA) 90 mcg/actuation inhaler Take 1-2 puffs by inhalation every 4 hours if needed. 1 inhaler 1    Amlodipine (NORVASC) 10 mg Tablet TAKE 1 TABLET BY MOUTH EVERY DAY. 90 tablet 1    Aspirin 81 mg DR Tablet EC Tablet Take 1 tablet by mouth every day. 30 tablet 5    Atorvastatin (LIPITOR) 10 mg Tablet Take 1 tablet by mouth every day at bedtime. 90 tablet 3    Blood Sugar Diagnostic (ONETOUCH ULTRA TEST) Strips Use for testing blood glucose 3 times per day 300 strip 3    CALCIUM-MAGNESIUM-ZINC PO       canagliflozin (INVOKANA) 100 mg Tablet Take 1 tablet by mouth every day. 90 tablet 3    Cholecalciferol, Vitamin D3, (VITAMIN D-3) 2,000 unit Tablet Take 1 tablet by mouth every day.      Diclofenac (VOLTAREN) 0.1 % Ophthalmic Solution Instill 1 drop into the LEFT eye 4 times daily. 5 mL 3    Escitalopram (LEXAPRO) 20 mg tablet Take 1 tablet by mouth every day. 90 tablet 3    Fluconazole (DIFLUCAN) 150 mg Tablet Take 1 tablet by mouth one time. May repeat after 3 days 2 tablet 0    FLUTICASONE/SALMETEROL (ADVAIR DISKUS INHA) Take  by inhalation.      GlipiZIDE (GLUCOTROL XL) 5 mg SR Tablet Take  1 tablet by mouth every morning with a meal. 30 tablet 11    Inhalational Spacing Device (AEROCHAMBER/FLOWSIGNAL) Spacer To use with inhaler as directed 1 each 1    Losartan (COZAAR) 25 mg Tablet Take 1 tablet by mouth every day. 90 tablet 3    Metformin (GLUCOPHAGE) 1,000 mg tablet Take 1 tablet by mouth 2 times daily with meals. (diabetes) 180 tablet 3    Moxifloxacin (VIGAMOX) 0.5% Ophthalmic Solution One drop 4 times a day in the left eye, start 2 days before surgery 3 mL 1    Omeprazole (PRILOSEC) 20 mg Delayed Release Capsule TAKE ONE CAPSULE BY MOUTH EVERY DAY 90 capsule 1    Oxybutynin (DITROPAN) 5 mg Tablet Take 5 mg by mouth 3 times daily.      Prednisolone Acetate (PRED FORTE, ECONOPRED PLUS) 1% Ophthalmic Suspension One drop 4 times a day in the left eye, start 2 days before surgery 15 mL 2     No current facility-administered medications on file prior to visit.        No past medical history on file.    Allergies:  Allergies   Allergen Reactions    Contrast Dye [Radiopaque Agent] Hives    Hydrochlorothiazide Other-Reaction in  Comments     Patient reports    Januvia [Sitagliptin] Other-Reaction in Comments     Patient reports    Lyrica [Pregabalin] Other-Reaction in Comments     Patient reports    Omnipaque [Iohexol] Other-Reaction in Comments     Patient reports    Prozac [Fluoxetine Hcl] Other-Reaction in Comments     Patient reports    Sulfa (Sulfonamide Antibiotics) Rash    Topiramate Other-Reaction in Comments     Patient reports       Exam:  Neuro: Alert & oriented to person, place, time  CV: no r/m/g  Pulm: CTAB  Extremities: No lower extremity edema.      Impression:  1. s/p cataract extraction with intraocular lens left eye, 1 month post-op, doing well.  Postoperative refraction done today.  Call immediately with any worsening of vision, worsening pain or increasing redness. Emergency contact information given.   Follow up 6 months w/ dilated fundus examination.    2. Visually  significant cataract right eye.  Informed consent obtained after detailed discussion of the risks and benefits of the procedure including need for glasses, glaucoma, retinal detachment, corneal or retinal swelling, bleeding, infection, need for further surgery, loss of vision, and loss of eye.   - patient reports she has had previous conversation with Dr Eda Keys about goals of vision, she requests the same for the right eye as she received in the left.    - Plan for cataract extraction with intraocular lens right eye, plan for DISTANCE vision.   - Prescription sent to patient's pharmacy for preoperative medications, plan to start drops 2 days prior to surgery:    - PF right eye qid    - Diclofenac right eye qid    - Vigamox right eye qid   - handout on pre operative instructions and emergency contact information given    Please see Dr Moshe Salisbury note for final assessment & plan.      Dorma Russell MD  Resident Physician PGY IV  Dept of Ophthalmology  [facility]  Pager [phone]  PI: [phone]

## 2015-01-25 NOTE — Progress Notes (Addendum)
Paula Daugherty is a 69yrold female who presents for a one month post-operative visit after cataract surgery. She is doing well and notes vision is improved. She reports the other eye is bothersome and interfering with her vision and daily activities. She is requesting a preoperative evaluation for cataract surgery in the other eye.       I again discussed cataract surgery in detail with the patient including the risks of the procedure. Risks discussed include the possiblily of infection, a severe bleeding episode as well as other risks. Though unlikely, I discussed with her that any of these problems could result in no improvement in vision, worse vision or even the possibility of a complete loss of vision. She also understands that any of these occurrences could necessitate futher surgery. I also discussed with her the possibility of a retinal tear or detachment. She also understands that it is possible that glasses will be needed to provide good vision for both reading and distance vision. She understands that cataract surgery is an elective surgery and that the only indication is her dissatisfaction with her vision. She has decided she would like to pursue cataract surgery.     Pre and postoperative instructions were reviewed with the patient in detail. A written copy was given to the patient with emergency contact information. The patient confirmed her understanding of the instructions which include starting the medicated drops prior to surgery per the prescription, avoiding eating for 8 hours before surgery, though clear liquids are OK up to 2 hours before surgery.     ASSESSMENT: s/p cataract surgery left eye for distance, doing well   Visually significant cataract, right eye, patient requesting surgery    PLAN:   Patient requesting cataract surgery with lens implant, right eye, TARGET DISTANCE VISION  Follow up for surgery  Use MPF lidocaine?  Difficult IV stick  NORES      ----------------------  Scribe  Disclaimer: This note was scribed by EMallory Shirk a trained medical scribe, in the presence of JLivingston Diones M.D.     Provider Disclaimer: This document serves as my personal record of services taken in my presence. It was created on 01/25/2015 on my behalf by EMallory Shirk a trained medical scribe. I have reviewed this document and agree that this note accurately reflects the history and exam findings, the patient care provided, and my medical decision making.    01/25/2015  Report electronically signed by:  JLivingston Diones MD   Professor  [facility] Department of Ophthalmology    .

## 2015-01-25 NOTE — Procedures (Signed)
I have reviewed the [facility] scans and they demonstrate good waveforms and consistant values. The scan was interpreted to determine the IOL to be used for the patient's upcomming surgery.     Report electronically signed by:   Jeffrey J Caspar, MD   Professor  [facility]  Department of Ophthalmology    .

## 2015-01-27 IMAGING — CR DG CHEST 2V
2 series · 2 of 2 positions shown · non-contrast
Comparison: None.

CLINICAL DATA: Cough.

CHEST - 2 VIEW

[view not recorded (1 of 2)]
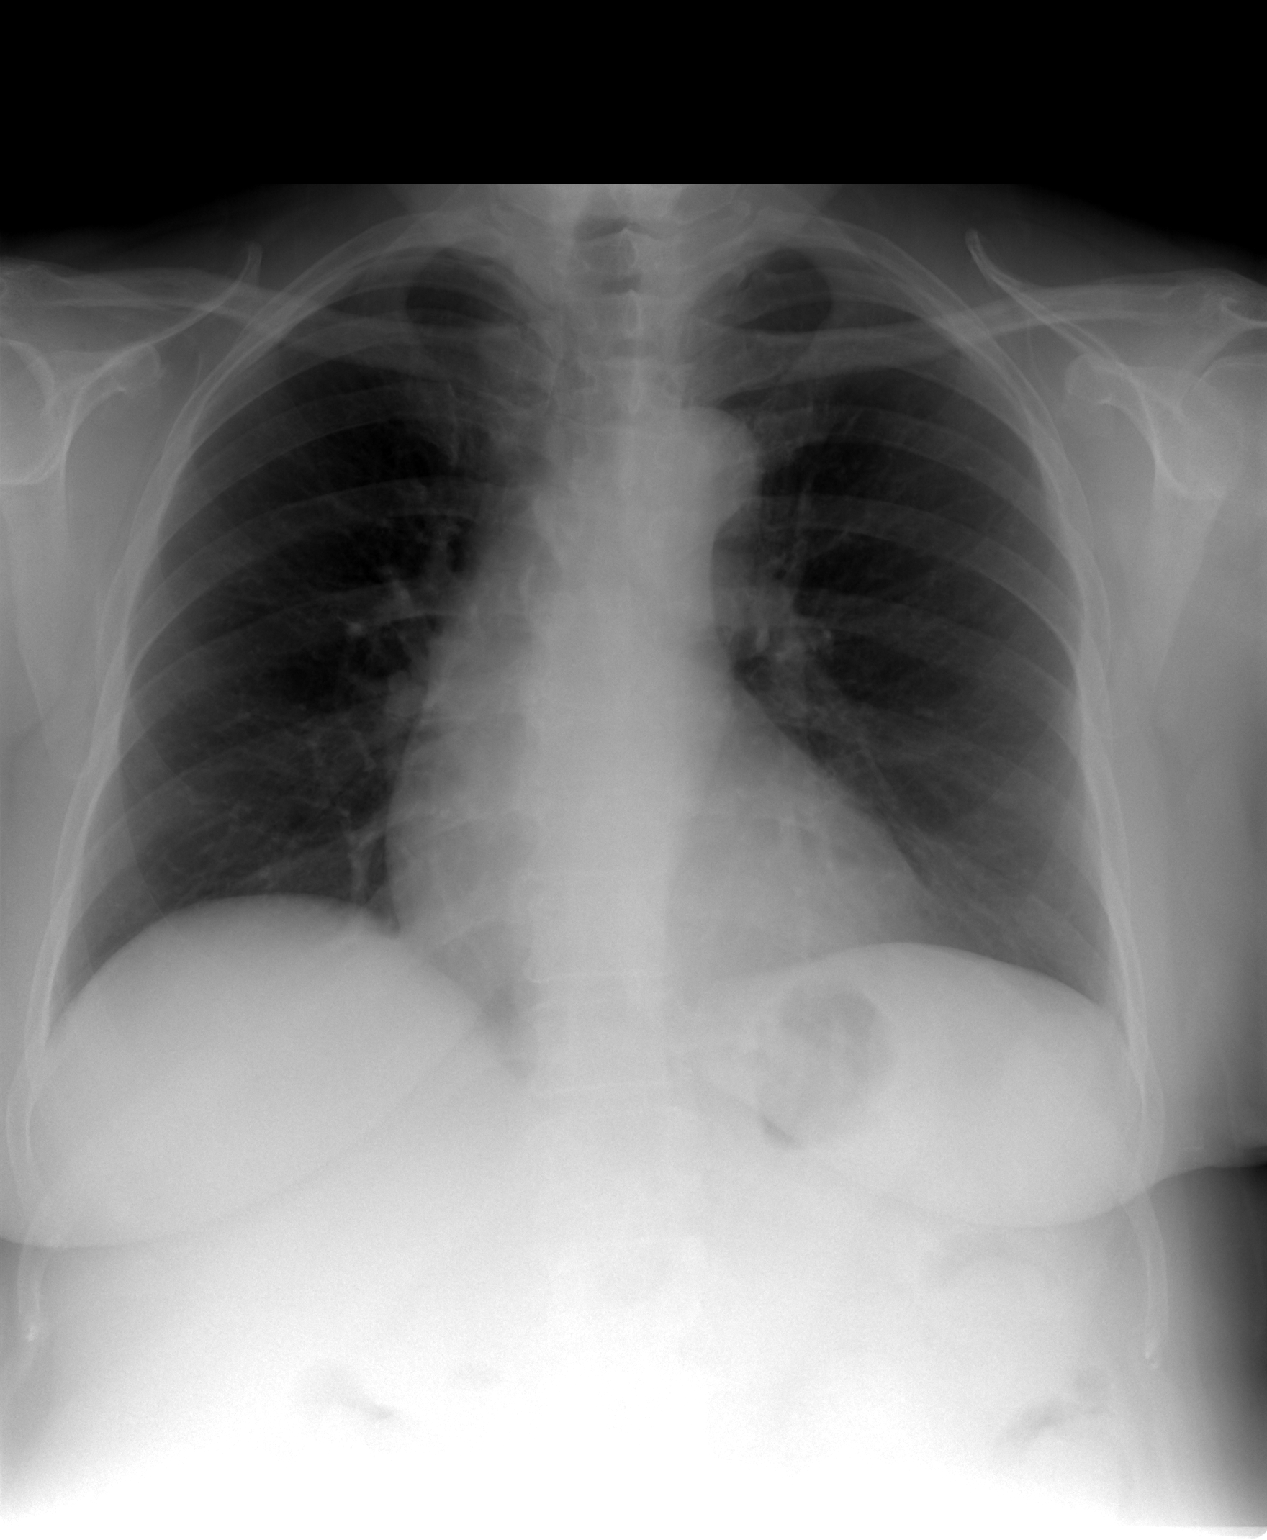

[view not recorded (2 of 2)]
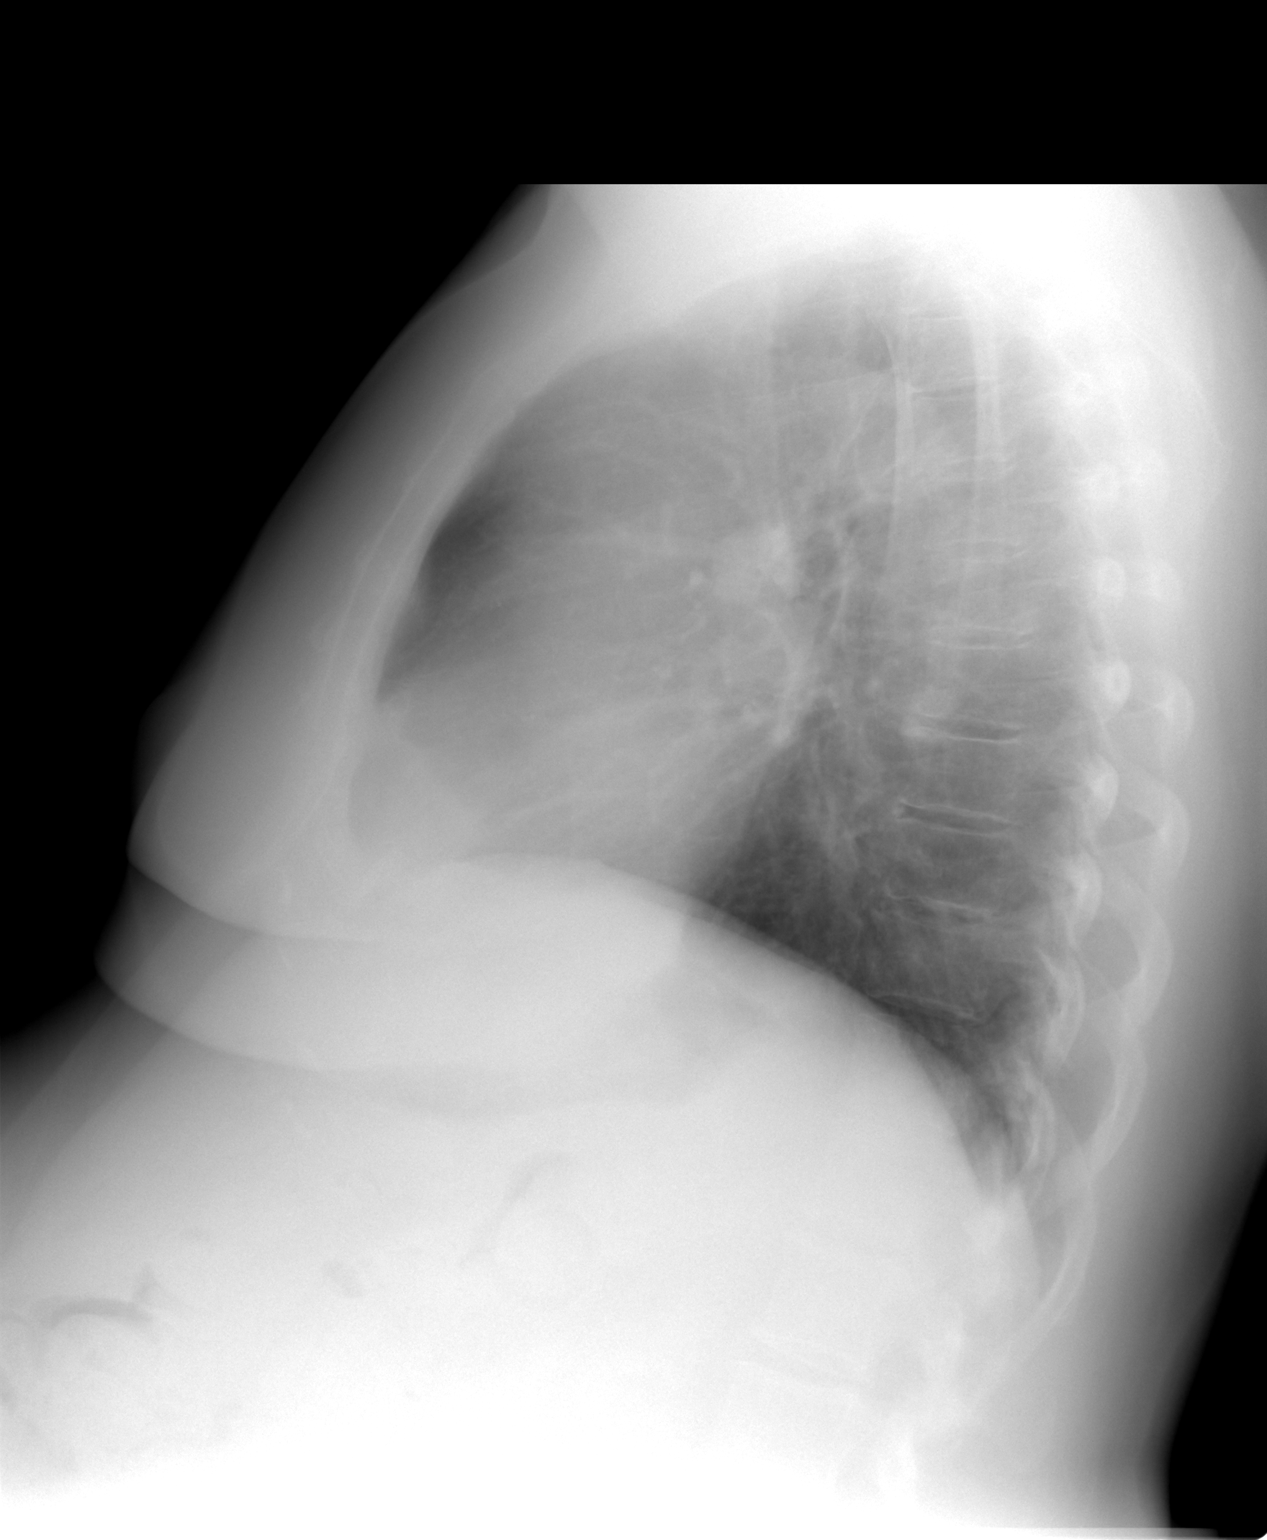

[2 of 2 positions shown; findings below may reference images not displayed]

FINDINGS: Lungs are clear.  Heart size is normal.  No pneumothorax
or pleural effusion.
IMPRESSION: Negative chest.

## 2015-01-29 ENCOUNTER — Ambulatory Visit: Payer: Self-pay | Admitting: Family Medicine

## 2015-01-30 ENCOUNTER — Other Ambulatory Visit: Payer: Self-pay

## 2015-01-30 NOTE — Progress Notes (Signed)
[facility] NURSE COACH DOCUMENTATION    [facility] Nurse Coaching INITIAL ENCOUNTER    Date: 12/20/14  Encounter Start: 14:04   Encounter End: 14:55     Patient's Current Exercise Habits:    None    Activity Intensity:    None     Overall Health & Fitness Goals:     Lose weight and Better blood sugar control    Upcoming SMART goal for next 2 weeks:    Achieve 4,000 steps, 5 days per week  Reduce sweets to 2 nights a week    Patient Identified Barriers :    Fatigue and Other (please specify) Availability of unhealthy snacks     Other reflections/comments:   Motivation for behavior change: Would like to be healthy and able for her self and her family.    Health & fitness concerns or limits:Per patient, she is experiencing fatigue all the time. She notes that when she gets out of bed in the morning, she feels like she could crawl right back in and sleep again.   Solutions to overcome barriers: We discussed keeping unhealthy food out of house - want ice cream, buy a single serving instead of a quart for example. Also discussed having healthy snacks or making a large batch of something healthier and keeping it in the fridge. Regarding her fatigue, she will make an appointment to discuss with her doctor as she feels it is interfering with her life.     Next telephonic follow-up:   Patient Outreach     No outreach performed during this encounter          PATIENT ENGAGEMENT/CARE COORDINATION  Administrative Data for Electronic Health Record    DISCIPLINES: REGISTERED NURSE    INTERVENTION TYPE    ASSESSMENT OR SCREENING, MOTIVATIONAL INTERVIEWING and GOAL SETTING: Patient Goal    PATIENT GENERATED SENSOR DATA: PHYSICAL ACTIVITY    LEVEL OF ENGAGEMENT: TELEPHONE CALL: Patient Intervention/Interaction    LENGTH OF INTERACTION: 50 minutes.    Electronically signed by:   Anette Riedel, RN  [place] Coach  [place]   [phone]

## 2015-01-30 NOTE — Progress Notes (Signed)
[facility] NURSE COACH DOCUMENTATION    [facility] Nurse Coaching SUBSEQUENT ENCOUNTER    Date: 01/30/15  Encounter Start: 0813   Encounter End: 1610    Previous SMART Goal:     Achieve 4,000 steps 5 days per week   Sweets down to 2 nights a week    Goal Success (patient self-rated):   PRIMARY GOAL: Physical Activity Scale: Sometimes (31-60%)    SECONDARY GOAL: Nutrition Scale:  Often (61-80%)   3 nights/week sweets    Frequency of Engagement with Technology Device:    Wearing Device: 100% time    Utilizing Applications: 0 days per week     Patient Identified Barriers:    Health issue or crisis   Pt reports that both she and her husband have a cold currently.   Facilitators Identified:    Other (please specify) Grouping tasks, having a planned event     Upcoming SMART goal for next 2 weeks:      [name] as of 01/30/2015 at 4:01 PM      [facility] - Physical Activity                  Step Goals: Achieve 4,000 steps 5 days per week.       [facility] - Nutrition                  Nutrition:    Change snack choices - sweets down to 2 nights a week.               Patient Identified Potential Barriers:    Fatigue    Patient Rated Behavior Change Importance Score (1-10): 7  Patient Rated Behavior Change Confidence Score (1-10): 8    Other reflections/comments:   Patient assessment of goal success: She notes that both she and her husband have been sick for the last couple weeks which has been sapping her strength and motivation.    Solutions to overcome barriers: As weather gets cooler, will start walking with husband (DJ). She also has a meeting this week to speak with her PCP about her fatigue.     Next telephonic follow-up:   Patient Outreach     Topic Date Method of Outreach Associated Actions User Next Outreach    Patient Generated Health Data 01/30/2015  6:27 PM Telephone  Anette Riedel, RN 02/13/2015          PATIENT ENGAGEMENT/CARE COORDINATION  Administrative Data for Electronic Health  Record    DISCIPLINES: REGISTERED NURSE    INTERVENTION TYPE  MOTIVATIONAL INTERVIEWING and GOAL SETTING: Patient Goal    PATIENT GENERATED SENSOR DATA: PHYSICAL ACTIVITY    LEVEL OF ENGAGEMENT: TELEPHONE CALL: Patient Intervention/Interaction    LENGTH OF INTERACTION: 30 minutes.    Electronically signed by:   Anette Riedel, RN  [place] Coach  [place]   [phone]

## 2015-01-31 ENCOUNTER — Ambulatory Visit: Payer: Medicare Other | Admitting: Family Medicine

## 2015-01-31 ENCOUNTER — Ambulatory Visit: Payer: Self-pay

## 2015-01-31 ENCOUNTER — Encounter: Payer: Self-pay | Admitting: Family Medicine

## 2015-01-31 ENCOUNTER — Ambulatory Visit: Payer: Medicare Other

## 2015-01-31 VITALS — BP 137/78 | HR 71 | Temp 98.7°F | Wt 183.6 lb

## 2015-01-31 DIAGNOSIS — G4710 Hypersomnia, unspecified: Secondary | ICD-10-CM

## 2015-01-31 DIAGNOSIS — J0100 Acute maxillary sinusitis, unspecified: Secondary | ICD-10-CM

## 2015-01-31 NOTE — Nursing Note (Signed)
Vital signs taken, allergies and pharmacy verified. Screened for pain. Med list given to patient for review.

## 2015-01-31 NOTE — Patient Instructions (Signed)
Start Flonase DAILY--one spray per nostril with head looking down and shaking bottle prior to each use and tip pointed straight up--use this as directed.  Symptomatic therapy suggested: gargle for sore throat, use mist/humidifier at bedside for congestion.  Apply facial warm packs for sinus pain.  Increase fluids, mucinex prn.  Also try saline water sprays at least twice per day.   Call or return to clinic if symptoms worsen, fail to improve  or other symptoms develop.    You have been referred to a specialist. We are pleased to offer the services of [facility] specialists located at the [facility] or [place] [facility] sites. Please allow 7 business days (3 if urgent) for the referral to be processed. After that time you may call: Neurology: [facility][id number], [phone]       A laboratory test has been ordered for you.

## 2015-01-31 NOTE — Progress Notes (Signed)
Chief Complaint   Patient presents with    Other     f/u and not feeling well      Paula Daugherty is a 69yrold female patient with multiple medical problems here due to 1 week history of congestion and non productive cough; nothing makes her symptoms better; nothing makes her symptoms worse; over the counter medications tried: none; close contacts include spouse.   She denies a history of sweats, chills, fevers, myalgias, wheezing, shortness of breath, chest pain and weakness and denies a history of asthma. Patient denies smoke cigarettes.     ROS:   General: no fever/chills  Stable FSBS  Feels like she needs more sleep--previous sleep study done 1 yr ago; some snoring     Patient Active Problem List   Diagnosis    DM2 (diabetes mellitus, type 2)    Depression with anxiety    Stress at home    Leg cramps-right calf    Right ear pain    Fibromyalgia    HTN (hypertension)    GERD (gastroesophageal reflux disease)    Hyperlipidemia with target LDL less than 70    Vitamin D insufficiency    Abnormal LFTs    Renal cyst ( 6.4 cm cyst left kidney)    Cough    Type 2 diabetes mellitus without complication    Fatty liver    Macroalbuminuric diabetic nephropathy    History of actinic keratoses    Cataract       Current Outpatient Prescriptions on File Prior to Visit   Medication Sig Dispense Refill    Albuterol (PROAIR HFA, PROVENTIL HFA, VENTOLIN HFA) 90 mcg/actuation inhaler Take 1-2 puffs by inhalation every 4 hours if needed. 1 inhaler 1    Amlodipine (NORVASC) 10 mg Tablet TAKE 1 TABLET BY MOUTH EVERY DAY. 90 tablet 1    Aspirin 81 mg DR Tablet EC Tablet Take 1 tablet by mouth every day. 30 tablet 5    Atorvastatin (LIPITOR) 10 mg Tablet Take 1 tablet by mouth every day at bedtime. 90 tablet 3    Blood Sugar Diagnostic (ONETOUCH ULTRA TEST) Strips Use for testing blood glucose 3 times per day 300 strip 3    CALCIUM-MAGNESIUM-ZINC PO       canagliflozin (INVOKANA) 100 mg Tablet Take 1 tablet by  mouth every day. 90 tablet 3    Cholecalciferol, Vitamin D3, (VITAMIN D-3) 2,000 unit Tablet Take 1 tablet by mouth every day.      Diclofenac (VOLTAREN) 0.1 % Ophthalmic Solution Instill 1 drop into the RIGHT eye 4 times daily. Start 2 days prior to surgery 2.5 mL 3    Escitalopram (LEXAPRO) 20 mg tablet Take 1 tablet by mouth every day. 90 tablet 3    Fluconazole (DIFLUCAN) 150 mg Tablet Take 1 tablet by mouth one time. May repeat after 3 days 2 tablet 0    FLUTICASONE/SALMETEROL (ADVAIR DISKUS INHA) Take  by inhalation.      GlipiZIDE (GLUCOTROL XL) 5 mg SR Tablet Take 1 tablet by mouth every morning with a meal. 30 tablet 11    Inhalational Spacing Device (AEROCHAMBER/FLOWSIGNAL) Spacer To use with inhaler as directed 1 each 1    Losartan (COZAAR) 25 mg Tablet Take 1 tablet by mouth every day. 90 tablet 3    Metformin (GLUCOPHAGE) 1,000 mg tablet Take 1 tablet by mouth 2 times daily with meals. (diabetes) 180 tablet 3    Moxifloxacin (VIGAMOX) 0.5% Ophthalmic Solution Instill 1 drop into the  RIGHT eye 4 times daily. Start 2 days prior to surgery 3 mL 1    Omeprazole (PRILOSEC) 20 mg Delayed Release Capsule TAKE ONE CAPSULE BY MOUTH EVERY DAY 90 capsule 1    Oxybutynin (DITROPAN) 5 mg Tablet Take 5 mg by mouth 3 times daily.      Prednisolone Acetate (PRED FORTE, ECONOPRED PLUS) 1% Ophthalmic Suspension Instill 1 drop into the RIGHT eye 4 times daily. Start 2 days prior to surgery 15 mL 1     No current facility-administered medications on file prior to visit.      Allergies:   Allergies   Allergen Reactions    Contrast Dye [Radiopaque Agent] Hives    Hydrochlorothiazide Other-Reaction in Comments     Patient reports    Januvia [Sitagliptin] Other-Reaction in Comments     Patient reports    Lyrica [Pregabalin] Other-Reaction in Comments     Patient reports    Omnipaque [Iohexol] Other-Reaction in Comments     Patient reports    Prozac [Fluoxetine Hcl] Other-Reaction in Comments     Patient  reports    Sulfa (Sulfonamide Antibiotics) Rash    Topiramate Other-Reaction in Comments     Patient reports       Social History     Social History    Marital status: MARRIED     Spouse name: N/A    Number of children: 1    Years of education: N/A     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[employer]       Social History Main Topics    Smoking status: Former Smoker     Packs/day: 0.10     Years: 20.00    Smokeless tobacco: Never Used      Comment: quit 30 years ago    Alcohol use 0.0 oz/week     0 Standard drinks or equivalent per week      Comment: rare shares a beer or glass wine every 2-3 months     Drug use: No    Sexual activity: Yes     Partners: Male     Other Topics Concern    None     Social History Narrative     Family History   Problem Relation Age of Onset    Diabetes Father     Heart Mother      MI at 71s     Non-contributory Brother     Non-contributory Brother        PE:  BP 137/78  Pulse 71  Temp 37.1 C (98.7 F) (Temporal)  Wt 83.3 kg (183 lb 9.6 oz)  LMP 05/06/1983  BMI 33.58 kg/m2  General Appearance: healthy, alert, no distress, pleasant affect, cooperative.  Eyes:  clear.  Ears:  normal TMs and canal and external inspection of ears show no abnormality.  Nose:  mucosa erythematous and swollen, mild tenderness to palpation along maxillary sinuses.  Mouth: OP clear.  Neck:  Supple; no LAD.  Heart:  normal rate and regular rhythm, no murmurs, clicks, or gallops.  Lungs: clear to auscultation; no R/R/W; moving air well; RR Of 12; no cough    Assessment and Plan:  (J01.00) Acute maxillary sinusitis, recurrence not specified  (primary encounter diagnosis)  Comment: mild symptoms not over 2 wks and patient reports ok to hold off on ABx   Plan: see instructions.  Call or return to clinic if symptoms worsen, fail to improve  or other symptoms develop.     (  G47.10) Excessive sleepiness  Comment: previous sleep study reviewed with patient   Plan: [place]              Total encounter time including history, physical examination, and coordination of care was approximately 15 minutes of which more than 50% was spent counseling regarding assessment/diagnosis and treatment plan regarding care and mgnt of above. No guarantees were made regarding her medical care or treatment outcome. Barriers to Learning: none.  Patient verbalizes understanding of teaching and instructions.    Electronically signed by:    [name], MD  [place], [place] [employer] of [facility] [employer]  Associate physician [facility], [place]   [phone]

## 2015-02-01 NOTE — Pre-Op/Pre-Procedure Screening (Addendum)
Chart review    Patient Name: Paula Daugherty  46yr [id number]   Scheduled Surgery Date: 02/12/2015  Proposed Surgery: Procedure(s):  PHACOEMULSIFICATION (Right)  IMPLANTATION INTRAOCULAR LENS (POSTERIOR CHAMBER) (Right)  Pre-op Dx: Pseudophakia [Z96.1]  Surgeon: Surgeon(s):  JLivingston Diones MD  AMorene Antu MD    Problems:  Patient Active Problem List    Diagnosis Date Noted    Cataract 12/18/2014    History of actinic keratoses 12/06/2014    Macroalbuminuric diabetic nephropathy 09/12/2014    Fatty liver 08/28/2014     Overview Note:     Possible portal HTN--hepatology referral placed 08/28/2014.       Type 2 diabetes mellitus without complication [id number]   Cough 12/14/2013    Renal cyst ( 6.4 cm cyst left kidney) 08/24/2013    Hyperlipidemia with target LDL less than 70 07/28/2013    Vitamin D insufficiency 07/28/2013    Abnormal LFTs 07/28/2013    DM2 (diabetes mellitus, type 2) 07/11/2013     Overview Note:     10/15 Diabetes- Dining with Diabetes: Basics  03/10/2014 Diabetes - Recharge Workshop      Depression with anxiety 07/11/2013    Stress at home 07/11/2013    Leg cramps-right calf 07/11/2013    Right ear pain 07/11/2013    Fibromyalgia 07/11/2013    HTN (hypertension) 07/11/2013    GERD (gastroesophageal reflux disease) 07/11/2013       PMH:  No past medical history on file.    PSH:  Past Surgical History   Procedure Laterality Date    Pr ligate fallopian tube       Tubal ligation    Pr total abdom hysterectomy  1980s     partial; no BSO    Pr knee scope,diagnostic  1980s     Knee arthroscopy    Pr repair tympanic membrane       Tympanoplasty    Colonoscopy  01/04/14     polyp--TA and erosion; hemorrhoids; tics; repeat x 3 yrs    Reduction breast  1985       Anesthetic Hx:  12/18/2014- MAC, no cx recorded     Allergies:   Allergies   Allergen Reactions    Contrast Dye [Radiopaque Agent] Hives    Hydrochlorothiazide Other-Reaction in Comments     Patient reports    Januvia  [Sitagliptin] Other-Reaction in Comments     Patient reports    Lyrica [Pregabalin] Other-Reaction in Comments     Patient reports    Omnipaque [Iohexol] Other-Reaction in Comments     Patient reports    Prozac [Fluoxetine Hcl] Other-Reaction in Comments     Patient reports    Sulfa (Sulfonamide Antibiotics) Rash    Topiramate Other-Reaction in Comments     Patient reports       Medications:.  No current facility-administered medications on file prior to encounter.      Current Outpatient Prescriptions on File Prior to Encounter   Medication Sig Dispense Refill    Albuterol (PROAIR HFA, PROVENTIL HFA, VENTOLIN HFA) 90 mcg/actuation inhaler Take 1-2 puffs by inhalation every 4 hours if needed. 1 inhaler 1    Amlodipine (NORVASC) 10 mg Tablet TAKE 1 TABLET BY MOUTH EVERY DAY. 90 tablet 1    Aspirin 81 mg DR Tablet EC Tablet Take 1 tablet by mouth every day. 30 tablet 5    Atorvastatin (LIPITOR) 10 mg Tablet Take 1 tablet by mouth every day at bedtime. 90 tablet 3  Blood Sugar Diagnostic (ONETOUCH ULTRA TEST) Strips Use for testing blood glucose 3 times per day 300 strip 3    CALCIUM-MAGNESIUM-ZINC PO       canagliflozin (INVOKANA) 100 mg Tablet Take 1 tablet by mouth every day. 90 tablet 3    Cholecalciferol, Vitamin D3, (VITAMIN D-3) 2,000 unit Tablet Take 1 tablet by mouth every day.      Escitalopram (LEXAPRO) 20 mg tablet Take 1 tablet by mouth every day. 90 tablet 3    Fluconazole (DIFLUCAN) 150 mg Tablet Take 1 tablet by mouth one time. May repeat after 3 days 2 tablet 0    FLUTICASONE/SALMETEROL (ADVAIR DISKUS INHA) Take  by inhalation.      GlipiZIDE (GLUCOTROL XL) 5 mg SR Tablet Take 1 tablet by mouth every morning with a meal. 30 tablet 11    Inhalational Spacing Device (AEROCHAMBER/FLOWSIGNAL) Spacer To use with inhaler as directed 1 each 1    Losartan (COZAAR) 25 mg Tablet Take 1 tablet by mouth every day. 90 tablet 3    Metformin (GLUCOPHAGE) 1,000 mg tablet Take 1 tablet by mouth 2  times daily with meals. (diabetes) 180 tablet 3    Omeprazole (PRILOSEC) 20 mg Delayed Release Capsule TAKE ONE CAPSULE BY MOUTH EVERY DAY 90 capsule 1    Oxybutynin (DITROPAN) 5 mg Tablet Take 5 mg by mouth 3 times daily.                      Drug/Alcohol Hx:  History   Smoking Status    Former Smoker    Packs/day: 0.10    Years: 20.00   Smokeless Tobacco    Never Used     Comment: quit 30 years ago     History   Alcohol Use    0.0 oz/week    0 Standard drinks or equivalent per week     Comment: rare shares a beer or glass wine every 2-3 months      History   Drug Use No       CV /PulmTests:    PFT 03/20/2014      Impression     Mixed restriction and airflow obstruction. Significant increase in FEV1  post-bronchodilator. A reduced ERV may be due to body habitus, neuromuscular  weakness, thoracic cage abnormalities, or pleural disease. Normal DLCO.    Results for Paula, Daugherty (MRN [id number]) as of 12/07/2014 15:22   Ref. Range 03/20/2014 13:06   FEV1 (PRE) Latest Ref Range: 0 - 12 Liters 1.73   FEV1 (POST) Latest Ref Range: 0 - 12 Liters 1.94   FVC (PRE) Latest Ref Range: 0 - 12 Liters 2.28   FVC (POST) Latest Ref Range: 0 - 12 Liters 2.39   FEV1/FVC (PRE) Latest Ref Range: 0 - 12 % 76   FEV1/FVC (POST) Latest Ref Range: 0 - 12 % 81   TLC (PRE) Latest Ref Range: 0.05-11.99 Liters 3.73   DLCO (POST) Latest Ref Range: 0.05-99.99 mL/mmHg/min      Sleep Study 02/17/2014      Labs:   CBC:     Lab Results  Component Value Date/Time   WHITE BLOOD CELL COUNT 8.7 12/06/2014 1145   RED CELL COUNT 5.05 12/06/2014 1145   HEMOGLOBIN 13.6 12/06/2014 1145   HEMATOCRIT 42.0 12/06/2014 1145   MCV 83.1 12/06/2014 1145   MCH 26.9 (L) 12/06/2014 1145   RDW 14.8 (H) 12/06/2014 1145   PLATELET COUNT 234 12/06/2014 1145  BMP/CMP:    Lab Results  Component Value Date/Time   SODIUM 141 08/22/2014 0815   POTASSIUM 4.2 08/22/2014 0815   CHLORIDE 103 08/22/2014 0815   CARBON DIOXIDE TOTAL 25 08/22/2014 0815   CREATININE BLOOD 0.82  08/22/2014 0815   E-GFR, AFRICAN AMERICAN >60 08/22/2014 0815   E-GFR, NON-AFRICAN AMERICAN >60 08/22/2014 0815   UREA NITROGEN, BLOOD (BUN) 17 08/22/2014 0815   GLUCOSE 109 (H) 08/22/2014 0815   CALCIUM 9.5 08/22/2014 0815       Lab Results  Component Value Date/Time   PROTEIN 7.7 08/22/2014 0815   ALBUMIN 3.9 08/22/2014 0815   ALKALINE PHOSPHATASE (ALP) 97 08/22/2014 0815   ASPARTATE TRANSAMINASE (AST) 40 08/22/2014 0815   BILIRUBIN TOTAL 0.8 08/22/2014 0815   ALANINE TRANSFERASE (ALT) 38 08/22/2014 0815       HgBA1C:   Lab Results   Lab Name Value Date/Time    HGBA1C 6.9 (H) 12/06/2014 11:45 AM    HGBA1C 6.7 (H) 08/22/2014 08:15 AM    HGBA1C 7.6 (H) 04/10/2014 03:05 PM       Coag Panel:    Lab Results  Component Value Date/Time   INR 1.02 12/08/2013 1030       Thyroid Panel:    Lab Results  Component Value Date/Time   THYROID STIMULATING HORMONE 1.58 12/08/2013 1030       Type and Screen:   No results found for: ABO    ROS:  CV: HTN well controlled,HLD  Resp: Former smoker,Seasonal Allergies,Mild Asthma - stable on routine/prn MDI  Neuro/Psych: Depression, Bilateral hearing loss  Musculoskeletal: Fibromyalgia  Med: GERD-controlled,Renal Cyst,fatty Liver,DM 2 well controlled AFBS 150-194,Vitamin D deficiency.     PE:   Per Anesthesia note on 12/08/2014  Airway   Mallampati: II  TM distance: >3 FB   Neck ROM is full.    Vitals:  Temp: 37.1 C (98.7 F) (01/31/2015  1:30 PM)  Temp src: Temporal (01/31/2015  1:30 PM)  Pulse: 71 (01/31/2015  1:30 PM)  BP: 137/78 (01/31/2015  1:30 PM)  Resp: 16 (01/02/2015  9:40 AM)  SpO2: 97 % (01/02/2015  9:40 AM)  Height: 1.575 m (5' 2" ) (12/18/2014 12:50 PM)  Weight: 83.3 kg (183 lb 9.6 oz) (01/31/2015  1:30 PM)      Patient's last menstrual period was 05/06/1983.      Paula Pottinger Charmaine Downs, RN

## 2015-02-04 IMAGING — CT CT HEAD W/O CM
1 series · 16 of 30 positions shown, 20 images · non-contrast
Comparison: None.

CLINICAL DATA: Slurred speech

CT HEAD WITHOUT CONTRAST
TECHNIQUE: Contiguous axial images were obtained from the base of
the skull through the vertex without contrast.

[Series 2: head_seq -c 4.5 h37s st · axial · 0.43mm/px · z∈[-123,+7]mm · 16 of 32 slices shown, 20 images]
[im 2/32  brain]
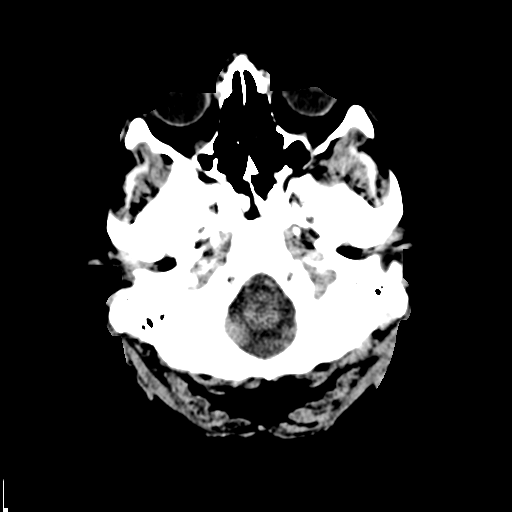
[im 2/32  bone]
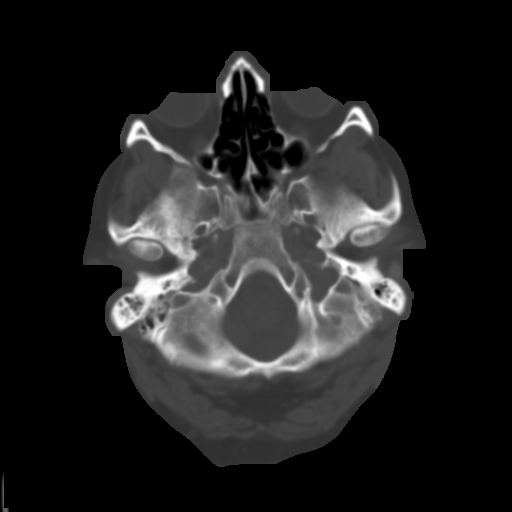
[im 4/32  brain]
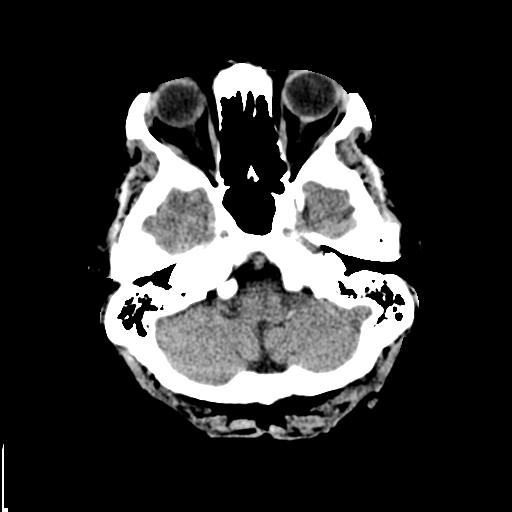
[im 6/32  brain]
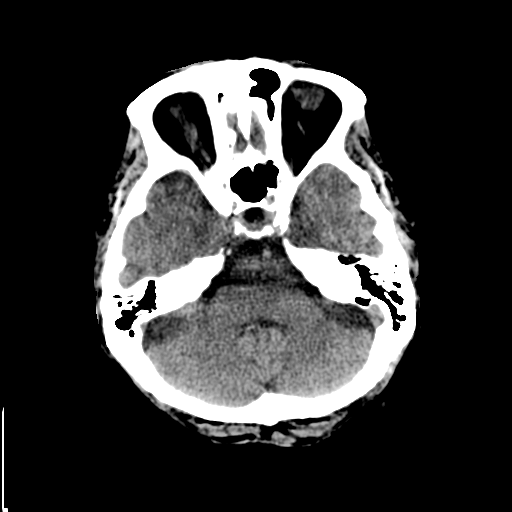
[im 8/32  brain]
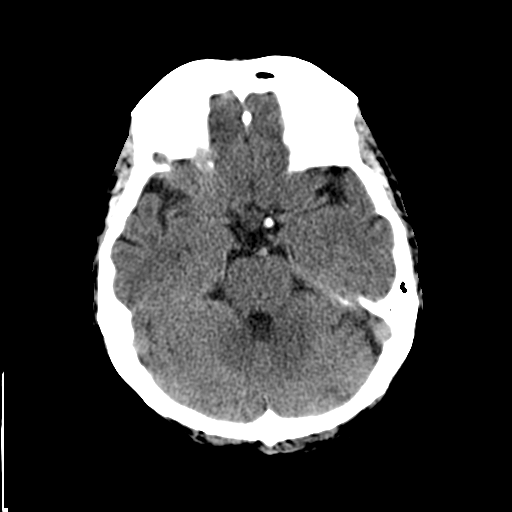
[im 9/32  brain]
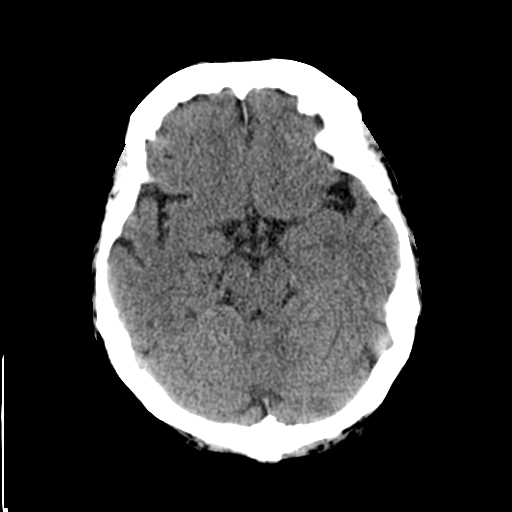
[im 9/32  bone]
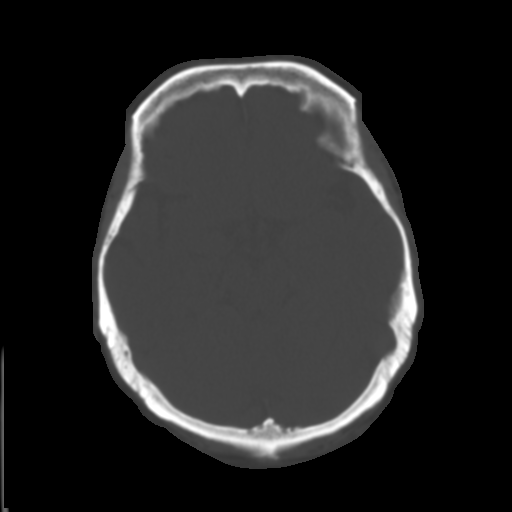
[im 11/32  brain]
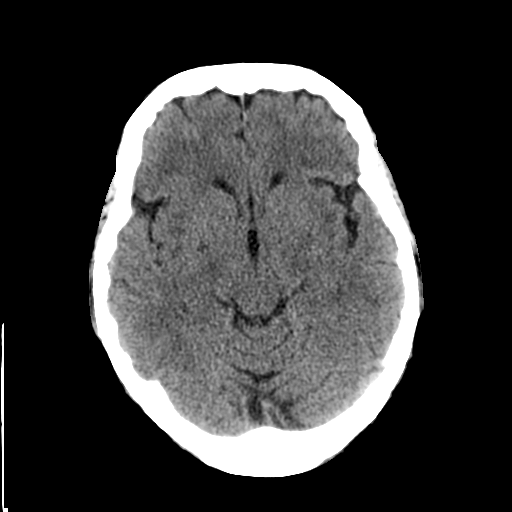
[im 13/32  brain]
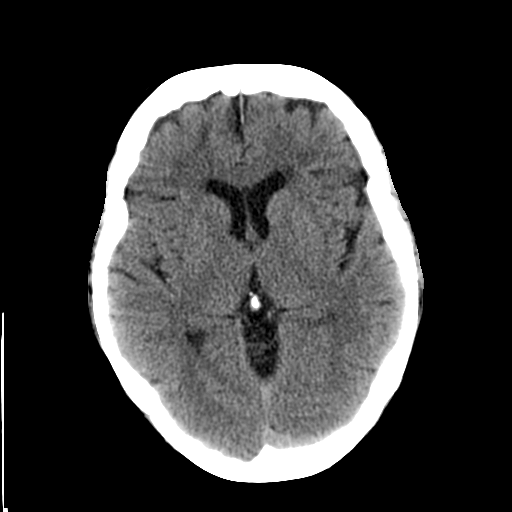
[im 15/32  brain]
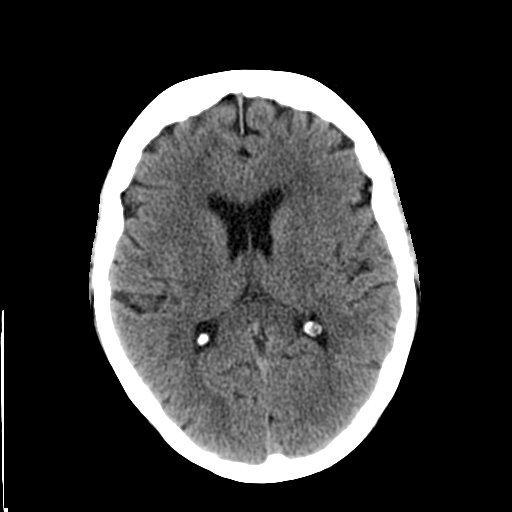
[im 17/32  brain]
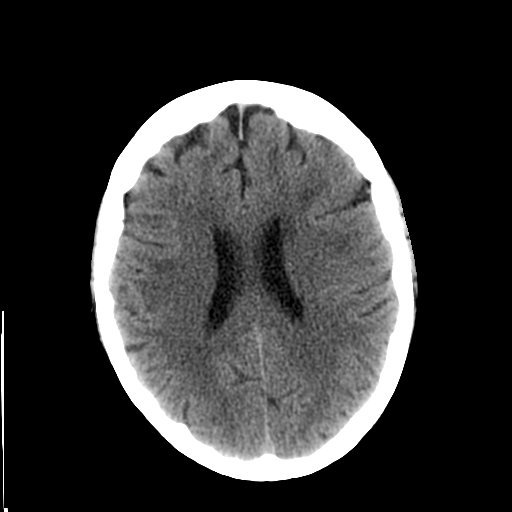
[im 17/32  bone]
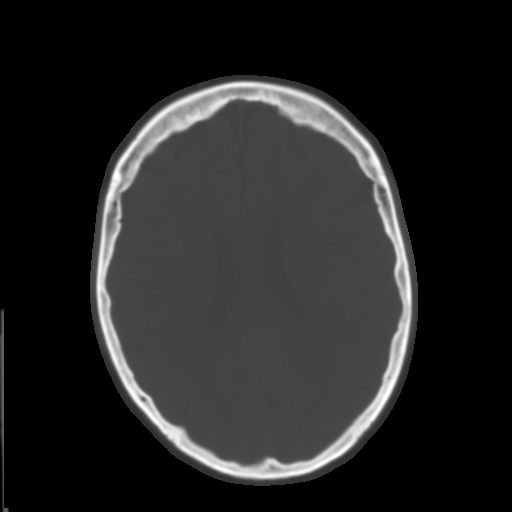
[im 19/32  brain]
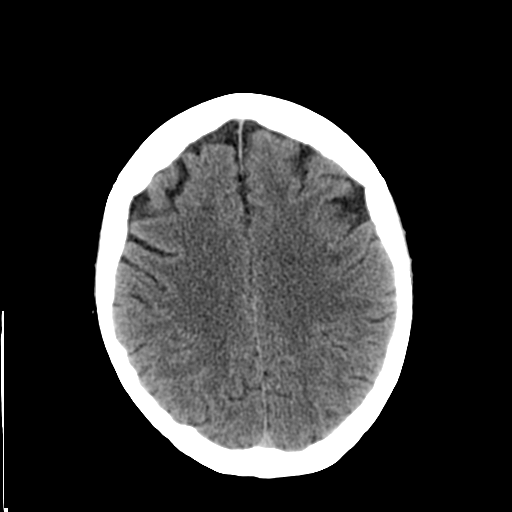
[im 21/32  brain]
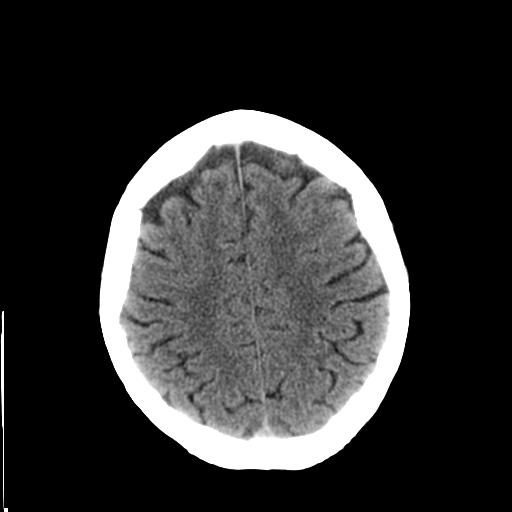
[im 23/32  brain]
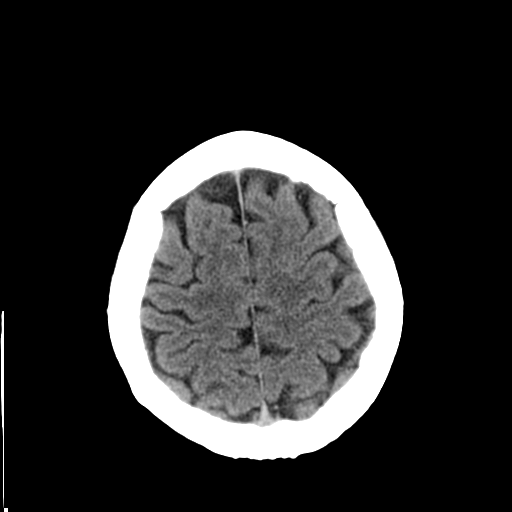
[im 24/32  brain]
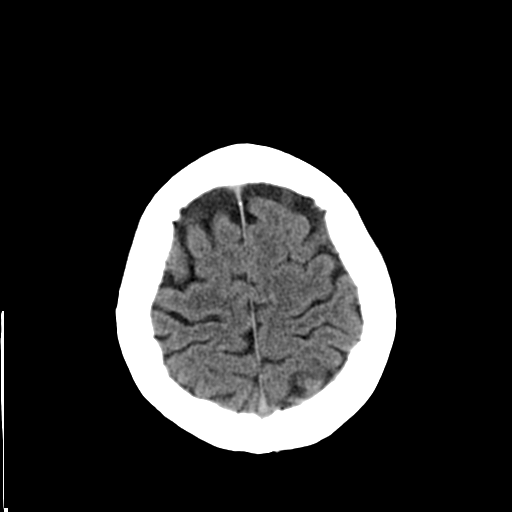
[im 24/32  bone]
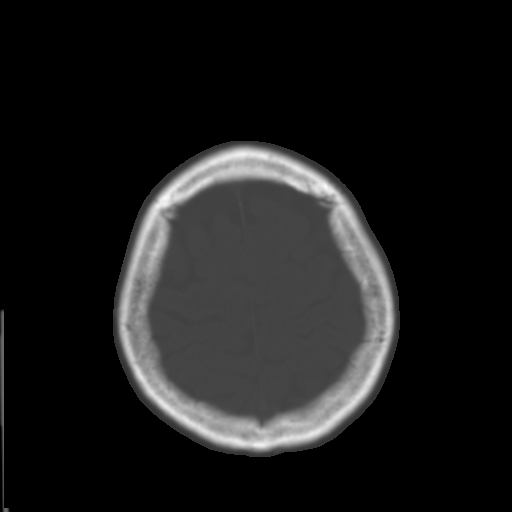
[im 26/32  brain]
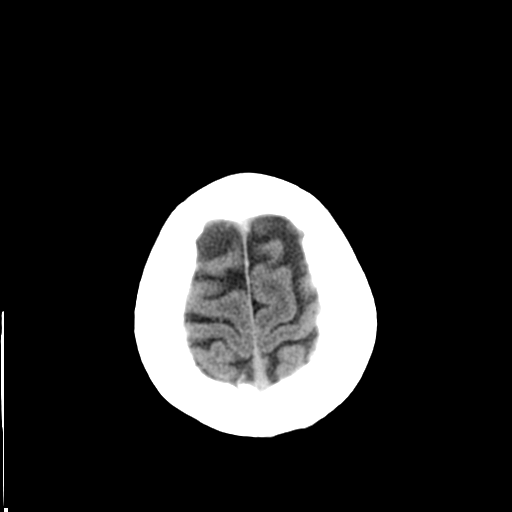
[im 28/32  brain]
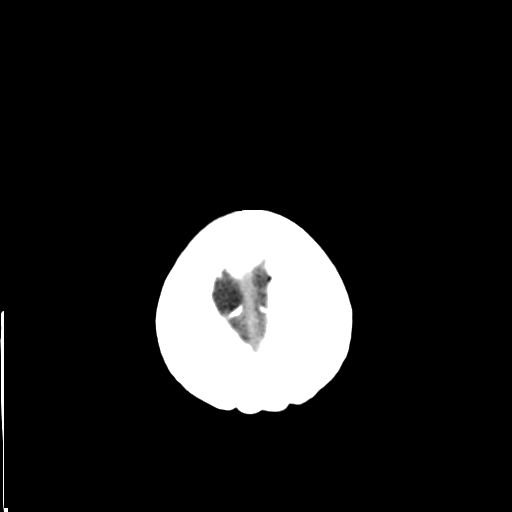
[im 30/32  brain]
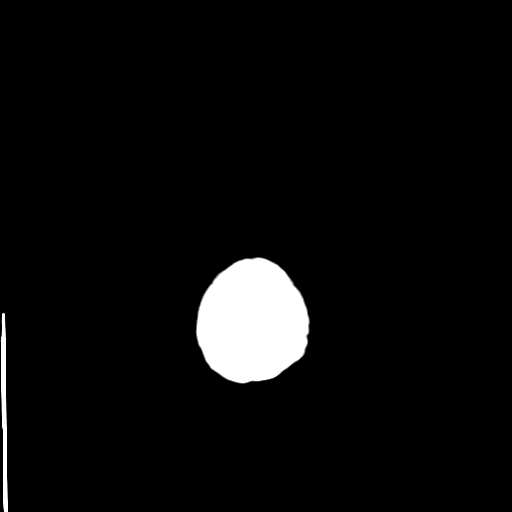

[16 of 30 positions shown; findings below may reference images not displayed]

FINDINGS: There is mild diffuse atrophy.  There is no mass,
hemorrhage, extra-axial fluid collection, or midline shift.  There
is fairly mild patchy small vessel disease in the centra semiovale
bilaterally.  Elsewhere, gray-white compartments appear within
normal limits.  There is no apparent acute infarct.

Bony calvarium appears intact.  The mastoid air cells are clear.
IMPRESSION: Mild atrophy with fairly mild patchy small vessel disease in the
supratentorial white matter.  Study otherwise unremarkable.

## 2015-02-05 ENCOUNTER — Telehealth: Payer: Self-pay | Admitting: Family Medicine

## 2015-02-05 DIAGNOSIS — J0100 Acute maxillary sinusitis, unspecified: Secondary | ICD-10-CM

## 2015-02-05 MED ORDER — AMOXICILLIN 875 MG TABLET
875.0000 mg | ORAL_TABLET | Freq: Three times a day (TID) | ORAL | 0 refills | Status: AC
Start: 2015-02-05 — End: 2015-02-15

## 2015-02-05 NOTE — Telephone Encounter (Signed)
3 patient identifiers used.    Relayed Md Jaeger's message to patient and she verbalized understanding. Patient very thankful.     Charlotte Sanes RN   Pcn Triage

## 2015-02-05 NOTE — Telephone Encounter (Signed)
Kenli Waldo is a 69yrold female  3 patient identifiers used.  Per:   patient  Reason for Call:  sinus  Symptoms:    Patient seen last week for sinus congestion. Patient continues to have a cough no wheezing. Per patient has terrible headache and pressure to eyes and sinus area. Wondering if antbx can be called in. Patient has surgery scheduled on 02/12/15 for cataracts.   Homecare and/or Medications given:  observation  Advice:  Advised I would send a message to MD and callback.  Pain: yes  Pain location and 1-10:  Headache, sinus         Disposition:     CONSULT MD:  Patient states she continues to have congestion and sinus pain and pressure. Patient has headache and has surgery scheduled on 02/12/15 for her cataracts? Please advise. Pharmacy and allergies verified.  Patient seen 0928/16.     Per:   patient verbalizes agreement to plan. Agrees to callback with any increase in symptoms/concerns or questions.     MCharlotte SanesRN  PCN Triage

## 2015-02-05 NOTE — Telephone Encounter (Signed)
ABx prescription sent 02/05/2015.  Please call or make an appointment with any questions or concerns or worsening symptoms by calling [phone].

## 2015-02-12 ENCOUNTER — Ambulatory Visit (HOSPITAL_BASED_OUTPATIENT_CLINIC_OR_DEPARTMENT_OTHER): Payer: Medicare Other | Admitting: Certified Registered"

## 2015-02-12 ENCOUNTER — Encounter
Admission: RE | Disposition: A | Discharge status: Home / Self Care | Payer: Self-pay | Source: Ambulatory Visit | Attending: Ophthalmology

## 2015-02-12 ENCOUNTER — Ambulatory Visit
Admission: RE | Admit: 2015-02-12 | Discharge: 2015-02-12 | DRG: 125 | Disposition: A | Discharge status: Home / Self Care | Payer: Medicare Other | Source: Ambulatory Visit | Attending: Ophthalmology | Admitting: Ophthalmology

## 2015-02-12 ENCOUNTER — Ambulatory Visit: Payer: Medicare Other | Admitting: Certified Registered"

## 2015-02-12 DIAGNOSIS — H269 Unspecified cataract: Secondary | ICD-10-CM | POA: Insufficient documentation

## 2015-02-12 HISTORY — PX: PHACOEMULSIFICATION, CATARACT: SHX001232

## 2015-02-12 HISTORY — DX: Essential (primary) hypertension: I10

## 2015-02-12 HISTORY — DX: Unspecified asthma, uncomplicated: J45.909

## 2015-02-12 HISTORY — DX: Type 2 diabetes mellitus without complications: E11.9

## 2015-02-12 SURGERY — PHACOEMULSIFICATION, CATARACT
Anesthesia: Monitored Anesthesia Care | Site: Eye | Laterality: Right | Wound class: Clean

## 2015-02-12 MED ORDER — MIDAZOLAM (PF) 1 MG/ML INJECTION SOLUTION
INTRAMUSCULAR | Status: DC | PRN
Start: 2015-02-12 — End: 2015-02-12
  Administered 2015-02-12: 2 mg via INTRAVENOUS

## 2015-02-12 MED ORDER — TETRACAINE 0.5 % EYE DROPS
3.0000 [drp] | OPHTHALMIC | Status: AC
Start: 2015-02-12 — End: 2015-02-12
  Administered 2015-02-12: 3 [drp] via OPHTHALMIC

## 2015-02-12 MED ORDER — ONDANSETRON HCL (PF) 4 MG/2 ML INJECTION SOLUTION
4.0000 mg | INTRAMUSCULAR | Status: DC | PRN
Start: 2015-02-12 — End: 2015-02-12

## 2015-02-12 MED ORDER — TROPICAMIDE 1 % EYE DROPS
3.0000 [drp] | OPHTHALMIC | Status: AC
Start: 2015-02-12 — End: 2015-02-12
  Administered 2015-02-12: 3 [drp] via OPHTHALMIC
  Filled 2015-02-12: qty 2

## 2015-02-12 MED ORDER — PHENYLEPHRINE 2.5 % EYE DROPS
3.0000 [drp] | OPHTHALMIC | Status: AC
Start: 2015-02-12 — End: 2015-02-12
  Administered 2015-02-12: 3 [drp] via OPHTHALMIC
  Filled 2015-02-12: qty 2

## 2015-02-12 MED ORDER — NACL 0.9% IV INFUSION
INTRAVENOUS | Status: DC
Start: 2015-02-12 — End: 2015-02-12

## 2015-02-12 MED ORDER — ACETAMINOPHEN 325 MG TABLET
650.0000 mg | ORAL_TABLET | Freq: Once | ORAL | Status: DC
Start: 2015-02-12 — End: 2015-02-12

## 2015-02-12 MED ORDER — LIDOCAINE HCL 10 MG/ML (1 %) INJECTION SOLUTION
0.1000 mL | INTRAMUSCULAR | Status: DC | PRN
Start: 2015-02-12 — End: 2015-02-12
  Administered 2015-02-12: 0.1 mL
  Filled 2015-02-12: qty 2

## 2015-02-12 MED ORDER — PREDNISOLONE ACETATE 1 % EYE DROPS,SUSPENSION
OPHTHALMIC | Status: DC | PRN
Start: 2015-02-12 — End: 2015-02-12
  Administered 2015-02-12: 2 [drp] via OPHTHALMIC

## 2015-02-12 MED ORDER — GATIFLOXACIN 0.5 % EYE DROPS
OPHTHALMIC | Status: DC | PRN
Start: 2015-02-12 — End: 2015-02-12
  Administered 2015-02-12: 2 [drp] via OPHTHALMIC

## 2015-02-12 MED ORDER — INTRAOP STERILE WATER 500 ML IRRIGATION BOTTLE
Status: DC | PRN
Start: 2015-02-12 — End: 2015-02-12
  Administered 2015-02-12: 500 mL

## 2015-02-12 MED ORDER — INTRAOP LIDOCAINE PF 1% INJ 5 ML VIAL ADDITIVE
Status: DC | PRN
Start: 2015-02-12 — End: 2015-02-12
  Administered 2015-02-12: .5 mL via INTRAOCULAR

## 2015-02-12 MED ORDER — NACL 0.9% IV INFUSION
INTRAVENOUS | Status: DC
Start: 2015-02-12 — End: 2015-02-12
  Administered 2015-02-12: 11:00:00 via INTRAVENOUS

## 2015-02-12 MED ORDER — HYDROMORPHONE 1 MG/ML INJECTION SYRINGE
0.2000 mg | INJECTION | INTRAMUSCULAR | Status: DC | PRN
Start: 2015-02-12 — End: 2015-02-12

## 2015-02-12 MED ORDER — FLURBIPROFEN 0.03 % EYE DROPS
3.0000 [drp] | OPHTHALMIC | Status: AC
Start: 2015-02-12 — End: 2015-02-12
  Administered 2015-02-12: 3 [drp] via OPHTHALMIC
  Filled 2015-02-12: qty 2.5

## 2015-02-12 MED ORDER — INTRAOP BALANCED SALT SOLUTION REGULAR 15 ML BOTTLE
Status: DC | PRN
Start: 2015-02-12 — End: 2015-02-12
  Administered 2015-02-12: 12:00:00

## 2015-02-12 MED ORDER — TETRACAINE 0.5 % EYE DROPS
OPHTHALMIC | Status: DC | PRN
Start: 2015-02-12 — End: 2015-02-12
  Administered 2015-02-12: 2 [drp] via OPHTHALMIC
  Administered 2015-02-12: 3 [drp] via OPHTHALMIC

## 2015-02-12 MED ORDER — FENTANYL (PF) 50 MCG/ML INJECTION SOLUTION
25.0000 ug | INTRAMUSCULAR | Status: DC | PRN
Start: 2015-02-12 — End: 2015-02-12

## 2015-02-12 MED ORDER — INTRAOP EPINEPHRINE 1 MG/ML PF INJ 1 ML AMPULE
Status: DC | PRN
Start: 2015-02-12 — End: 2015-02-12
  Administered 2015-02-12: 12:00:00 via INTRAOCULAR

## 2015-02-12 MED ORDER — FENTANYL (PF) 50 MCG/ML INJECTION SOLUTION
INTRAMUSCULAR | Status: DC | PRN
Start: 2015-02-12 — End: 2015-02-12
  Administered 2015-02-12 (×2): 50 ug via INTRAVENOUS

## 2015-02-12 SURGICAL SUPPLY — 18 items
CARTIDGE MONARCH III C FOR IOL DELIVERY SYSTEM (Other) ×2 IMPLANT
DEVICE SHIELD CLEAR PLASTIC (Dressing) ×2 IMPLANT
GLOVES BIOGEL 6 1/2 TOP GLOVE LATEX (Glove) ×2 IMPLANT
GLOVES BIOGEL SENSOR 7 1/2 TOP GLOVE LATEX BURGUNDY PACKAGE (Glove) ×2 IMPLANT
GLOVES BIOGEL SENSOR 8 1/2 TOP GLOVE LATEX BURGUNDY PACKAGE (Glove) ×2 IMPLANT
GLOVES BIOGEL SKINSENSE 7 1/2 TOP GLOVE LATEX FREE ~~LOC~~ PACKAGE (Glove) ×2 IMPLANT
GOWN LARGE AERO BLUE AAMI 3 STANDARD (Gown) IMPLANT
HEADREST DONUT 9IN (M10-090) (Other) ×2 IMPLANT
IOL ACRYSOF SN60WF +22.5 (Intraocular lens) ×2 IMPLANT
IRRIGATION ASPIRATION TIP ANGLED DISPOSABLE OCULAR (Other) ×2 IMPLANT
NEEDLE FILTER 18 GAUGE X 1 1/2IN (Needle) ×2 IMPLANT
PACK INFINITI ANTERIOR VITRECTOMY WITH INFUSION CATHETER (Pack) IMPLANT
PACK INFINITI PIK (Pack) ×2 IMPLANT
PACK KIM/CASPAR EYE (Pack) ×2 IMPLANT
PACK PHACO INFINITI ULTRASOUND FMS (Pack) IMPLANT
SLEEVE COAXIAL ANTERIOR VITRECTECTOMY INFERIOR (Other) IMPLANT
SYRINGE 6ML WITH LUER LOCK TIP STERILE PACK (Syringe) ×2 IMPLANT
SYRINGE LL 60ML (11866000777) (Syringe) ×2 IMPLANT

## 2015-02-12 NOTE — Anesthesia Postprocedure Evaluation (Signed)
Patient: Paula Daugherty    PHACOEMULSIFICATION  IMPLANTATION INTRAOCULAR LENS (POSTERIOR CHAMBER)    Anesthesia Type: MAC    Vital Signs (Last Recorded):  BP: 154/80  Pulse: 58  Resp: 10  Temp Max: 36.8 C (98.2 F)  (Last 24 hours)  Temp: 36.3 C (97.3 F)  SpO2: 94 % on           Anesthesia Post Evaluation    Procedure: Procedure(s):PHACOEMULSIFICATIONIMPLANTATION INTRAOCULAR LENS (POSTERIOR CHAMBER)  Location: [facility] OR  Anesthesia: Anesthesia type not filed in the log.    Patient participation: complete - patient participated  Level of consciousness: awake and alert  Pain score: 0  Pain management: adequate  Airway patency: patent  Anesthetic complications: no  Cardiovascular status: blood pressure returned to baseline  Respiratory status: room air, nonlabored ventilation and spontaneous ventilation  Hydration status: euvolemic  Comments: Ok to D/C from anesthesia  02/12/2015 13:04  Electronically Signed by:  Royal Piedra, MD  Attending  Anesthesia  Payton Emerald, Pager [phone]       Fransico Meadow, MD          Fransico Meadow, MD

## 2015-02-12 NOTE — OR Nursing (Signed)
OR To Postop Destination    Patient's level of consciousness: awake and comfortable  Patient transferred to: PACU  Transported via: eye bed  Transported by: circulator and CRNA  Head of bed: elevated  Oxygen delivered via: N/A  Monitored enroute: yes, visual  Report given to: PACU RN, Rowe Robert

## 2015-02-12 NOTE — Plan of Care (Signed)
Problem: Perioperative Period (Adult)  Goal: Signs and Symptoms of Listed Potential Problems Will be Absent or Manageable (Perioperative Period)  Signs and symptoms of listed potential problems will be absent or manageable by discharge/transition of care (reference Perioperative Period (Adult) CPG).   Outcome: Ongoing (interventions implemented as appropriate)

## 2015-02-12 NOTE — Discharge Instructions (Signed)
Cataract Surgery Postoperative Instructions:     Leave the protective shield on your eye until morning. You may peel it down to use your drops. You may remove it before coming into clinic.   No heavy lifting, more than 15 pounds; no straining or bending over at the waist.   It is okay to shower, but keep your eyes closed and blot them gently before opening them.   It is okay to watch television.   For the first 12-24 hours, we recommend no driving.   Continue using all three of your preoperative drops in the operative eye 3 more times today.    Tomorrow, continue using your eye drops 4 times per day (breakfast, lunch, dinner, and bedtime).   Usually, there is only a small amount of pain with this surgery. If you have moderate pain, take Tylenol.   You should call your doctor immediately if:   *Your vision suddenly changes (gets worse)   *you have significant pain in the operated eye   *Your eye becomes more red than it was at discharge or if it develops pus    Possible Problems/Emergency Contact Numbers:      If you develop any symptoms such as fevers with temperature greater than 101.5 F, chills, persistent nausea and vomiting, inability to urinate, severe pain not controlled with medications, pus drainage from the eye, or for any acute problems or illnesses, contact your physician:     ANYTIME/Daytime (8:30am to 4:30pm): Dr. Moshe Salisbury office [phone]   Or Ophthalmology Clinic [phone]    Night/Weekend: [phone] Ask the hospital operator to page the Ophthalmology (eye) Resident on-call.  Have your medical record number available.    General Postoperative Instructions:    Immediate post-op period:  It is normal to feel dizzy and sleepy for several hours after your operation.  Therefore, you should not drive, operate any equipment, sign any important papers or make any significant decisions until the next day.    Diet: Start with a light meal.  Progress to a normal diet as tolerated.  You  have received pain medication that may make you sick to your stomach.  Soups and foods that are easy to digest are best tolerated as you begin to eat (avoid spicy and fatty food).  Drink plenty of fluids.  Do not drink alcoholic beverages for 24 hours.    You may be receiving a survey in the mail, it is important that you fill it out as we use this to recognize our staff.

## 2015-02-12 NOTE — Procedures (Addendum)
INPATIENT OPERATION RECORD    OPERATION DATE: 02/12/2015    SURGEON: Hessie Diener, MD    ASSISTANT: Valentina Shaggy, MD    PREOPERATIVE DIAGNOSIS:     Visually significant cataract, right eye    POSTOPERATIVE DIAGNOSIS:    Same.     PROCEDURE:    Phacoemulsification of cataract, implantation of posterior chamber intraocular lens, right eye  (32440)    TECHNIQUE:    The patient arrived at the [facility] and was identified by the attending surgeon. The operative eye was confirmed with the patient. An IV was started and the patient's operative eye was dilated and pre-treated with topical tetracaine. The patient was then brought to the operating suite and monitors were affixed for ECG, blood pressure, pulse oximetry, etc. A surgical pause was performed with all the OR staff participating. The power of the IOL was agreed upon by the resident and attending and noted on the board. The patient's operative eye was again treated with several drops of 1/2% tetracaine. The patient's operative eye was carefully prepped with Betadine and sterilely draped. A surgical pause was performed involving all members of the [facility] staff. The contra-lateral eye was shielded with a Fox shield. During the surgery, the patient received mild sedation under direction of the Anesthesia Department.     A nasal lid speculum was placed. A sideport incision was created with a "Super Sharp" blade. The anterior chamber was filled with Shugarcaine (epinephrine and MPF lidocaine) and then Viscoat. A clear corneal incision was created temporally with a 2.75 mm metal keratome blade. A 360-degree continuous curvilinear capsulorrhexis was then begun with a cystotome needle and completed with Utrata forceps. The nucleus was hydrodissected with balanced saline solution via an intraocular cannula with multiple good fluid waves noted.     The phacoemulsification instrument was then introduced into the eye and the tip buried into the nucleus. The  nucleus was then chopped into two hemispheres with the assistance of the [employer]. The nucleus was rotated 60 degrees with the first hemisphere engaged with the phacoemulsification tip. The MicroFinger was then used to chop the hemisphere into thirds, the fragments being brought forward into the anterior chamber and emulsified. The remaining hemisphere was disassembled in a similar manner.     The remaining cortex material was gently removed with the 45-degree tip irrigation and aspiration unit. The posterior capsule was gently polished with the IA tip and inspected and found to be completely intact. The capsular bag was deepened with ProVisc. An [employer] model SN-60WF posterior chamber lens of 22.5 diopteric power and serial number [id number], was loaded into the "C" cartridge of the Monarch injector system, the cartridge having been previously filled with viscoelastic. The injector was introduced into the eye and the injector tip advanced to introduce the lens into the capsular bag. The lens was inspected and was determined to be completely within the capsular bag and showed good centration.     The 45-degree IA tip was used to rotate the lens and remove any residual viscoelastic. The tip was placed behind the lens implant to insure no viscoelastic remained in the capsular bag. The corneal wound and side port were stromally hydrated with balanced saline and the wound checked and found to be watertight. The speculum and drape were gently removed and the eye treated with a drop of prednisilone acetate and Zymaxid. A clear shield was affixed to the eye and the patient returned to the postoperative area in excellent condition.  The patient was carefully instructed in postoperative care and emergency contact information.     COMPLICATIONS: None.    Valentina Shaggy, MD  Ophthalmology, PGY-II  Service Pager: [phone]  Personal Pager: [phone]    I was present for the entire procedure from start to  finish.    Report electronically signed by:   Livingston Diones, MD   Professor  [facility]  Department of Ophthalmology

## 2015-02-12 NOTE — Anesthesia Preprocedure Evaluation (Addendum)
Anesthesia Evaluation    History of Present Illness  Right Phaco w/ IOL    Pt denies complications with anesthesia    Pt denies family complications with anesthesia  Airway   Mallampati: IV  TM distance: >3 FB     Neck ROM is full. Dental    (+) missing   Pulmonary - negative for pulmonary conditions with ROS and normal exam Cardiovascular - cardiovascular exam normal  (+) hypertension (),  hypertriglyceridemia,     Patient has good exercise tolerance.   Neuro/Psych    Patient has psychiatric history.   GI/Hepatic/Renal   (+) GERD (well controlled),        Endo/Other    (+) diabetes mellitus type 2,     Comments: +fibromyalgia    Smoker- denies  ETOH- occasional,                      Anesthesia Plan    ASA 2     MAC     intravenous induction   Anesthetic plan and risks discussed with patient and spouse.  Resident/Fellow/CRNA discussed the plan with the attending.  I personally performed a physical assessment on this patient.

## 2015-02-12 NOTE — OR Nursing (Signed)
OR Arrive    Patient's level of consciousness: awake and comfortable    Summary report reviewed: yes    Patient transported to OR via: eye bed  Transported by: CRNA  Head of bed: elevated  Oxygen delivered via: N/A  Monitored enroute: yes, visual  Assumed care:  V. Adalea Handler RN received report from Rosaura Carpenter, preop RN

## 2015-02-12 NOTE — H&P (Signed)
H&P Update:    Ms. Gethers is a 35yrfemale who presents for cataract surgery.    She reports that she was recently diagnosed with sinusitis by her PCP, started on a regimen of amoxicillin, which she has almost completed.    Patient seen and examined today.     PHYSICAL EXAM:  General: Well-appearing, no acute distress  Cardio: Regular rate and rhythm  Pulm: Clear to auscultation bilaterally    ASSESSMENT: visually significant cataract right eye    PLAN: cataract surgery right eye    AValentina Shaggy MD  Ophthalmology, PGY-II  Service Pager: [place]ager: [phone]

## 2015-02-13 ENCOUNTER — Ambulatory Visit: Payer: Medicare Other | Attending: Ophthalmology | Admitting: Ophthalmology

## 2015-02-13 DIAGNOSIS — Z961 Presence of intraocular lens: Secondary | ICD-10-CM

## 2015-02-13 DIAGNOSIS — Z9841 Cataract extraction status, right eye: Secondary | ICD-10-CM | POA: Insufficient documentation

## 2015-02-13 DIAGNOSIS — Z48810 Encounter for surgical aftercare following surgery on the sense organs: Secondary | ICD-10-CM | POA: Insufficient documentation

## 2015-02-13 NOTE — Progress Notes (Signed)
Patients presents 1 day status post cataract surgery in the right eye . She reports mild  discomfort.    Slit lamp exam right eye:  Lashes/Lids: Normal  Conjunctiva: +1 injection  Cornea: intact temporal incision  Anterior Chamber: 1+ cell/flare  IOL: Well centered    Assessment:  Post operative day one, status post cataract surgery in the right eye  Doing well    Plan:  Discussed post-operative care in detail with patient.   Continue Vigamox, Voltaren and Prednisolone 4 times a day  Patient instructed to wear protective shield at night  Call immediately with any worsening of vision, worsening pain or increasing redness. Emergency contact information given.  Followup 1 week    Report electronically signed by:   Livingston Diones, MD   Professor  [facility]  Department of Ophthalmology    .

## 2015-02-14 ENCOUNTER — Other Ambulatory Visit: Payer: Self-pay | Admitting: "Endocrinology

## 2015-02-14 ENCOUNTER — Encounter: Payer: Self-pay | Admitting: Ophthalmology

## 2015-02-14 ENCOUNTER — Ambulatory Visit: Payer: Self-pay

## 2015-02-14 DIAGNOSIS — E1129 Type 2 diabetes mellitus with other diabetic kidney complication: Secondary | ICD-10-CM

## 2015-02-14 MED ORDER — GLIPIZIDE ER 5 MG TABLET, EXTENDED RELEASE 24 HR
5.0000 mg | EXTENDED_RELEASE_CAPSULE | Freq: Every day | ORAL | 3 refills | Status: DC
Start: 2015-02-14 — End: 2015-09-12

## 2015-02-14 NOTE — Telephone Encounter (Signed)
Sent to Dr. Caprice Renshaw.  Caroll Rancher, MA

## 2015-02-14 NOTE — Telephone Encounter (Signed)
From: [name]  To: Nyra Jabs, MD  Sent: 02/14/2015 11:16 AM PDT  Subject: Visit Follow-up Question    Hi Dr, I wanted to let you know on October 10th I finished my 2nd eye cataract surgery. You had wanted that done before you proceed with the eye lid surgery. Please advise of my next steps. Thank you.

## 2015-02-14 NOTE — Telephone Encounter (Signed)
Replied to the patient via e-mail through my chart with following message.  Kindly schedule patient with Dr. Aron Baba clinic first available for pre-operative evaluation.    ------------------  Hi Mrs. Francom,    It appears that you are recovering well from your cataract surgeries which is great.   It is generally best that you complete the post operative care for the cataract surgery before we proceed with the lid surgery.    I will forward a note to our schedulers to schedule you for a pre-operative visit with Dr. Augustin Coupe for the lid surgery.    Thanks,    Shelda Altes, M.D., M.S.  PGY 4 Resident to Dr. Augustin Coupe  [facility]  Department of Ophthalmology & [place] Number:  [phone]

## 2015-02-14 NOTE — Telephone Encounter (Signed)
Routed to appt center. Please contact patient to schedule next available follow up appt with Dr Augustin Coupe.    Thank you,  [place]: [phone]

## 2015-02-20 ENCOUNTER — Other Ambulatory Visit: Payer: Self-pay

## 2015-02-20 NOTE — Progress Notes (Signed)
[facility] NURSE COACH DOCUMENTATION    [facility] Nurse Coaching SUBSEQUENT ENCOUNTER    Date: 02/20/15  Encounter Start: 8338   Encounter End: 1505    Previous SMART Goal:     Achieve 4,000 steps, 5 days per week   Sweets down to 2 nights/week    Goal Success (patient self-rated):   PRIMARY GOAL: Physical Activity Scale: Rarely (10-30%)    SECONDARY GOAL: Nutrition Scale:  Never    Frequency of Engagement with Technology Device:    Wearing Device: 100% time    Utilizing Applications: 3 days per week     Patient Identified Barriers:    Goal unrealistic, Health issue or crisis and Weather (too hot or cold or rainy)    Facilitators Identified:    Tax inspector, Support from friend, family member or colleague;  and Other (please specify) Find healthier replacement foods    Assessment of Goals from '[name]' (clinician rated)   PRIMARY GOAL: Physical Activity  Scale:Rarely (10-30%)    Upcoming SMART goal for next 2 weeks:      [name] as of 02/20/2015 at 2:58 PM             Most Recent   01/30/15     [facility] - Physical Activity   On track (01/30/2015)  On track               Step Goals: Achieve 3,000 steps 5 days per week.       [facility] - Nutrition   On track (01/30/2015)  On track               Nutrition:    Change snack choices - sweets down to 2 nights a week.               Patient Identified Potential Barriers:    Children or family needs taking priority and Health issue or crisis    Patient Rated Behavior Change Importance Score (1-10): 8  Patient Rated Behavior Change Confidence Score (1-10): 8    Other reflections/comments:   Patient assessment of goal success: Pt had eye surgery last week and is still recovering, and both she and her husband were sick. She is still experiencing a lot of stress from her family situation. She and her husband have been trying to be more active during the day when she feels she has the energy to do so, but she feels 4,000 steps was too  high a goal. Typical days are in the mid 2,000 range. She has felt successful with reducing portion sizes over the past two weeks and feels like her pants are fitting looser. While she still has sweets most nights, she notes that they are having smaller portion sizes and have stopped stocking things at home.     Next telephonic follow-up:   Patient Outreach     Topic Date Method of Outreach Associated Actions User Next Outreach    [name] 02/20/2015  3:25 PM Telephone  Anette Riedel, RN 03/06/2015          PATIENT ENGAGEMENT/CARE COORDINATION  Administrative Data for Electronic Health Record    DISCIPLINES: REGISTERED NURSE    INTERVENTION TYPE    MOTIVATIONAL INTERVIEWING and GOAL SETTING: Patient Goal    PATIENT GENERATED SENSOR DATA: PHYSICAL ACTIVITY    LEVEL OF ENGAGEMENT: TELEPHONE CALL: Patient Intervention/Interaction    LENGTH OF INTERACTION: 20 minutes.    Electronically signed by:   Anette Riedel,  RN  [place]   [phone]

## 2015-02-27 ENCOUNTER — Ambulatory Visit: Payer: Medicare Other | Attending: Ophthalmology | Admitting: Ophthalmology

## 2015-02-27 DIAGNOSIS — Z961 Presence of intraocular lens: Secondary | ICD-10-CM | POA: Insufficient documentation

## 2015-02-27 DIAGNOSIS — Z48810 Encounter for surgical aftercare following surgery on the sense organs: Secondary | ICD-10-CM | POA: Insufficient documentation

## 2015-02-27 DIAGNOSIS — Z9841 Cataract extraction status, right eye: Secondary | ICD-10-CM | POA: Insufficient documentation

## 2015-02-27 NOTE — Progress Notes (Addendum)
Paula Daugherty is a 69yrold female who presents 1 week status post cataract surgery in the right eye. She reports no discomfort. No headaches.    Assessment:  Post operative week one, status post cataract surgery right eye, doing well    Plan:  Discussed post-operative care in detail.  Discontinue Vigamox  Taper predinsolone acetate and Voltaren approximately one drop per week, written instructions given  Patient may discontinue protective shield at night  Call immediately with any worsening of vision, worsening pain or increasing redness. Emergency contact information given.    Followup 3-4 weeks      ----------------------  Scribe Disclaimer: This note was scribed by EMallory Shirk a trained medical scribe, in the presence of JLivingston Diones M.D.     Provider Disclaimer: This document serves as my personal record of services taken in my presence. It was created on 02/27/2015 on my behalf by EMallory Shirk a trained medical scribe. I have reviewed this document and agree that this note accurately reflects the history and exam findings, the patient care provided, and my medical decision making.    02/27/2015  Report electronically signed by:  JLivingston Diones MD   Professor  [facility] Department of Ophthalmology  .

## 2015-03-05 ENCOUNTER — Other Ambulatory Visit: Payer: Self-pay

## 2015-03-05 NOTE — Progress Notes (Signed)
Health Management and Education    Reviewed the pertinent portion of patients chart. Sent mychart message for Patient, requesting return call to schedule phone appointment. Awaiting Call back.       Electronically signed by   Keturah Barre, [place] Work, Care Manager   Health Management and Education  [phone]

## 2015-03-07 ENCOUNTER — Encounter: Payer: Self-pay | Admitting: Family Medicine

## 2015-03-07 ENCOUNTER — Ambulatory Visit: Payer: Medicare Other | Admitting: Family Medicine

## 2015-03-07 ENCOUNTER — Other Ambulatory Visit: Payer: Self-pay

## 2015-03-07 VITALS — BP 158/90 | HR 60 | Temp 97.8°F | Wt 184.0 lb

## 2015-03-07 DIAGNOSIS — I10 Essential (primary) hypertension: Secondary | ICD-10-CM

## 2015-03-07 DIAGNOSIS — K760 Fatty (change of) liver, not elsewhere classified: Secondary | ICD-10-CM

## 2015-03-07 DIAGNOSIS — E1129 Type 2 diabetes mellitus with other diabetic kidney complication: Secondary | ICD-10-CM

## 2015-03-07 DIAGNOSIS — F418 Other specified anxiety disorders: Secondary | ICD-10-CM

## 2015-03-07 NOTE — Progress Notes (Signed)
[facility] NURSE COACH DOCUMENTATION    [facility] Nurse Coaching SUBSEQUENT ENCOUNTER    Date: 03/07/15  Encounter Start: 3338   Encounter End: 1741    Previous SMART Goal:     3,000 steps, 5 days/week   Sweets down to 2 nights a week    Goal Success (patient self-rated):   PRIMARY GOAL: Physical Activity Scale: Always or Exceeded (100%)   SECONDARY GOAL: Nutrition Scale:  Never    Frequency of Engagement with Technology Device:    Wearing Device: 100% time    Utilizing Applications: 7 days per week     Patient Identified Barriers:    None, all goals met    Facilitators Identified:    Coach accountability, Internal motivation and Support from friend, family member or colleague;     Assessment of Goals from 'Patient [place]' (clinician rated)   PRIMARY GOAL: Physical Activity  Scale:Always or Exceeded (100%)    Upcoming SMART goal for next 2 weeks:      Patient [place] as of 03/07/2015 at 5:30 PM             Most Recent   01/30/15     [facility] - Physical Activity   On track (01/30/2015)  On track               Step Goals: Achieve 3,000 steps 5 days per week.       [facility] - Nutrition   On track (01/30/2015)  On track               Nutrition:    Change snack choices - swap out for healthier snack 4 nights per week.               Patient Identified Potential Barriers:    On vacation    Patient Rated Behavior Change Importance Score (1-10): 9  Patient Rated Behavior Change Confidence Score (1-10): 10    Other reflections/comments:  Patient assessment of goal success: Pleased with success. Her daughter has been helping encourage her. She has also changed her mindset and does what she can, and then takes a break, instead of continuing to push herself. She finds herself sleeping less at home. She and her husband have also been making changes to their diet. They've been eating more protein which she feels has been decreasing her snacking.     Next telephonic follow-up:   Patient Outreach     Topic Date  Method of Outreach Associated Actions User Next Outreach    Patient [place] 03/07/2015  5:15 PM Telephone  Anette Riedel, RN 03/21/2015          PATIENT ENGAGEMENT/CARE COORDINATION  Administrative Data for Electronic Health Record    DISCIPLINES: REGISTERED NURSE    INTERVENTION TYPE    MOTIVATIONAL INTERVIEWING and GOAL SETTING: Patient Goal    PATIENT GENERATED SENSOR DATA: PHYSICAL ACTIVITY    LEVEL OF ENGAGEMENT: TELEPHONE CALL: Patient Intervention/Interaction    LENGTH OF INTERACTION: 25 minutes.    Electronically signed by:   Anette Riedel, RN  [place] Coach  [place]   [phone]

## 2015-03-07 NOTE — Progress Notes (Signed)
Paula Daugherty is a 69yrold female patient here for follow-up with PCP  Chief Complaint   Patient presents with    Other     f/u        1. Stress: due to mother's declining health; [place]; on Lexapro 20 mg; counseling encouraged   2. DM2: A1c at goal; labs planned prior to next endocrine follow-up  3. GHCM: Prevnar 13 due next year due to recent Pneumovax.  Patient plans to get Flu vaccine at pharmacy     ROS:   .Constitutional: no fever/chills.  CV: no chest pain, palpitations, shortness of breath or edema      Past Medical History   Diagnosis Date    Asthma     Diabetes mellitus     Hypertension        Current Outpatient Prescriptions on File Prior to Visit   Medication Sig Dispense Refill    Albuterol (PROAIR HFA, PROVENTIL HFA, VENTOLIN HFA) 90 mcg/actuation inhaler Take 1-2 puffs by inhalation every 4 hours if needed. 1 inhaler 1    Amlodipine (NORVASC) 10 mg Tablet TAKE 1 TABLET BY MOUTH EVERY DAY. 90 tablet 1    Aspirin 81 mg DR Tablet EC Tablet Take 1 tablet by mouth every day. 30 tablet 5    CALCIUM-MAGNESIUM-ZINC PO       canagliflozin (INVOKANA) 100 mg Tablet Take 1 tablet by mouth every day. 90 tablet 3    Cholecalciferol, Vitamin D3, (VITAMIN D-3) 2,000 unit Tablet Take 1 tablet by mouth every day.      Diclofenac (VOLTAREN) 0.1 % Ophthalmic Solution Instill 1 drop into the RIGHT eye 4 times daily. Start 2 days prior to surgery 2.5 mL 3    Escitalopram (LEXAPRO) 20 mg tablet Take 1 tablet by mouth every day. 90 tablet 3    Fluconazole (DIFLUCAN) 150 mg Tablet Take 1 tablet by mouth one time. May repeat after 3 days 2 tablet 0    FLUTICASONE/SALMETEROL (ADVAIR DISKUS INHA) Take  by inhalation.      GlipiZIDE (GLUCOTROL XL) 5 mg SR Tablet Take 1 tablet by mouth every morning with a meal. 90 tablet 3    Inhalational Spacing Device (AEROCHAMBER/FLOWSIGNAL) Spacer To use with inhaler as directed 1 each 1    Losartan (COZAAR) 25 mg  Tablet Take 1 tablet by mouth every day. 90 tablet 3    Metformin (GLUCOPHAGE) 1,000 mg tablet Take 1 tablet by mouth 2 times daily with meals. (diabetes) 180 tablet 3    Moxifloxacin (VIGAMOX) 0.5% Ophthalmic Solution Instill 1 drop into the RIGHT eye 4 times daily. Start 2 days prior to surgery 3 mL 1    Omeprazole (PRILOSEC) 20 mg Delayed Release Capsule TAKE ONE CAPSULE BY MOUTH EVERY DAY 90 capsule 1    Oxybutynin (DITROPAN) 5 mg Tablet Take 5 mg by mouth 3 times daily.      Prednisolone Acetate (PRED FORTE, ECONOPRED PLUS) 1% Ophthalmic Suspension Instill 1 drop into the RIGHT eye 4 times daily. Start 2 days prior to surgery 15 mL 1     No current facility-administered medications on file prior to visit.      Allergies:   Allergies   Allergen Reactions    Contrast Dye [Radiopaque Agent] Hives    Hydrochlorothiazide Other-Reaction in Comments     Patient reports    Januvia [Sitagliptin] Other-Reaction in Comments     Patient reports    Lyrica [Pregabalin]  Other-Reaction in Comments     Patient reports    Omnipaque [Iohexol] Other-Reaction in Comments     Patient reports    Prozac [Fluoxetine Hcl] Other-Reaction in Comments     Patient reports    Sulfa (Sulfonamide Antibiotics) Rash    Topiramate Other-Reaction in Comments     Patient reports       Social History     Social History    Marital status: MARRIED     Spouse name: N/A    Number of children: 1    Years of education: N/A     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       Social History Main Topics    Smoking status: Former Smoker     Packs/day: 0.10     Years: 20.00    Smokeless tobacco: Never Used      Comment: quit 30 years ago    Alcohol use 0.0 oz/week     0 Standard drinks or equivalent per week      Comment: rare shares a beer or glass wine every 2-3 months     Drug use: No    Sexual activity: Yes     Partners: Male     Other Topics Concern    None     Social History Narrative     Family History   Problem  Relation Age of Onset    Diabetes Father     Heart Mother      MI at 92s     Non-contributory Brother     Non-contributory Brother        PE:  BP 158/90  Pulse 60  Temp 36.6 C (97.8 F) (Temporal)  Wt 83.5 kg (184 lb)  LMP 05/06/1983  BMI 33.65 kg/m2  .General Appearance: healthy, alert, no distress, pleasant affect, cooperative.  Heart:  normal rate and regular rhythm, no murmurs, clicks, or gallops.  Lungs: clear to auscultation.  Extremities:  no cyanosis, clubbing, or edema.  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 No guarantees were made regarding her medical care or treatment outcome. Barriers to Learning: none.  Patient verbalizes understanding of teaching and instructions.    Electronically signed by:    [name], MD  [place], [place] Board of Family Medicine  Associate physician [facility], [place]   [phone]

## 2015-03-07 NOTE — Nursing Note (Signed)
Vital signs taken, allergies and pharmacy verified. Screened for pain. Med list given to patient for review.

## 2015-03-08 ENCOUNTER — Other Ambulatory Visit: Payer: Self-pay

## 2015-03-08 NOTE — Progress Notes (Signed)
Health Management and Education       Reviewed the pertinent portion of patients chart. PCP requested ASAP follow up. Called and left voice mail message for Patient, requesting return call to schedule phone appointment. Awaiting Call back.       Electronically signed by   Keturah Barre, [place] Work, Care Manager   Health Management and Education  [phone]

## 2015-03-15 ENCOUNTER — Other Ambulatory Visit: Payer: Self-pay

## 2015-03-15 NOTE — Progress Notes (Signed)
Health Management and Education      Reviewed the pts chart, and have been communicating by mychart for further resources and follow up. Please see mychart encounters for details.       Electronically signed by   Keturah Barre, [place] Work, Care Manager   Health Management and Education  [phone]

## 2015-03-20 ENCOUNTER — Encounter: Payer: Self-pay | Admitting: Family Medicine

## 2015-03-20 MED ORDER — FLUCONAZOLE 150 MG TABLET
150.0000 mg | ORAL_TABLET | Freq: Once | ORAL | 0 refills | Status: AC
Start: 2015-03-20 — End: 2015-03-21

## 2015-03-20 NOTE — Telephone Encounter (Signed)
From: [name]  To: [name], MD  Sent: 03/20/2015 8:36 AM PST  Subject: Non-urgent Medical Advice Question    Hi dr, I think I have a yeast infection again. I have really bad itching, burning and extreme discomfort in the vaginal area. I have tried several OTC but no help and getting worse. Would you be willing to order an antibiotic like last time? It took care of things pretty quickly. Thank you     Thank you, Paula Daugherty

## 2015-03-21 ENCOUNTER — Other Ambulatory Visit: Payer: Self-pay

## 2015-03-21 NOTE — Progress Notes (Signed)
[facility] NURSE COACH DOCUMENTATION    [facility] Nurse Coaching CONCLUDING ENCOUNTER    Date: 03/21/15  Encounter Start: 18:59   Encounter End: 1930    Previous SMART Goal:     Achieve 3,000 steps, 5 days per week   Change snack choices, swap out healthier snacks 4 nights per week    Assessment of Previous Smart Goal (patient rated)    Goal Success (self-rated):   PRIMARY GOAL: Nutrition Scale: Never   SECONDARY GOAL: Physical Activity Scale:  Always or Exceeded (100%)    Frequency of Engagement with Technology Device:    Wearing Device: 100% time    Utilizing Applications: 7 days per week     Barriers Identified:    Children or family needs taking priority and On vacation    Facilitators Identified:    Tax inspector, Internal motivation, Sensor/device accountability and Sensor/device reminders      Overall Patient Rated Goal Success: 80 %    Overall Behavior Change: Pt reports that she is happy with the changes she's made in the past few months. She feels she is making healthier food choices, and she is more active. She is getting up and getting dressed everyday and napping less.  Would like to continue increasing her walking and continue working on her nutrition.     Lessons Learned: Tips like not bringing sweets/desserts home or buying single serve options has helped encourage less sweets. Stocked healthier snacks at home that are also packaged as a single serving has been helpful.     Next Health Goals:      Overall Health & Fitness Goals:      Lose weight and Better blood sugar control      Long term SMART goals:       Patient Generated Health Data Goals as of 03/21/2015 at 7:26 PM             Most Recent   01/30/15     [facility] - Physical Activity   On track (01/30/2015)  On track               Step Goals: Achieve 3,000 steps 5 days per week.       [facility] - Nutrition   On track (01/30/2015)  On track               Nutrition:    Change snack choices - sweets down to 2 nights a week.               PATIENT  ENGAGEMENT/CARE COORDINATION  Administrative Data for Electronic Health Record      DISCIPLINES: REGISTERED NURSE    INTERVENTION TYPE    MOTIVATIONAL INTERVIEWING and GOAL SETTING: Patient Goal    PATIENT GENERATED SENSOR DATA: PHYSICAL ACTIVITY      LEVEL OF ENGAGEMENT: TELEPHONE CALL: Patient Intervention/Interaction    LENGTH OF INTERACTION: 30 minutes.      Electronically signed by:   Anette Riedel, RN  [place] Coach  [place]   [phone]

## 2015-03-21 NOTE — Progress Notes (Signed)
Healthcare Management and Education:     Care Coordination Telephone Follow Up Note; 3 identifiers used     Patient Reports: She recently returned from visiting her mother out of state, and helping take care of some issues. Pt reports she feels challenges with her aging mother, and care giving are more related to power struggles between her bother and her mother, and this causes her stress. The pt states she is the [place] and wants to honor her mothers wishes to stay in her home, and is trying to help her hire assistance, but her brother is seeking guardianship of her mother. She states her mother has saught her own legal assistance. In the meantime the pt states she is trying to take care of herself while traveling with her diet, and feels having the diabetic educators to work with her on attainable goals has made a difference. Pt states she is also trying to catch up on her follow up appointments.  The pt states that she has many appointments and feels to try and find a therapist right now too would stress her out more than help. Pt states she would prefer using the strategies discussed for self care, and follow up after the holidays to see if she is interested in finding a therapist.         Care Coordination Goals: decrease depression, and increase connection to providers and functioning.     Barriers:  Medicare and limited time availability are at times barriers to care.     Interventions Provided: Motivational Interviewing, symptom assessment, support, validation and empathy, psychoeducation relationship between stress, depression and diabetes, strategies for coping with ongoing stressors      Plan:   Patient - Follow up with pcp, take medications as directed, consider strategies discussed, continue to ask for help when needed, and call this LCSW if interested in finding a therapist.   LCSW - follow up with pt in 4-6 weeks. Encouraged pt to call sooner if needed       Minutes:  30     Electronically signed by    Keturah Barre, [place] [facility], [place] [employer] [facility]  [phone]

## 2015-03-22 ENCOUNTER — Ambulatory Visit: Payer: Medicare Other | Attending: Ophthalmology | Admitting: Ophthalmology

## 2015-03-22 DIAGNOSIS — Z961 Presence of intraocular lens: Secondary | ICD-10-CM

## 2015-03-22 DIAGNOSIS — Z9841 Cataract extraction status, right eye: Secondary | ICD-10-CM | POA: Insufficient documentation

## 2015-03-22 DIAGNOSIS — Z48810 Encounter for surgical aftercare following surgery on the sense organs: Secondary | ICD-10-CM | POA: Insufficient documentation

## 2015-03-22 NOTE — Progress Notes (Addendum)
Paula Daugherty is a 69yrold female presents 1 month status post cataract surgery in the right eye. She reports no discomfort. She relates a slight "shimmer and blurriness" in the right eye after a plane ride but has resolved since returning home from her trip. Patient notes photophobia. She is happy with her improved vision post-operatively. She uses glasses for reading as needed.     Assessment:  One month status post cataract surgery in the right eye for DISTANCE, doing well  Pseudophakia OU    The signs and symptoms of a retinal tear and detachment were discussed with the patient in detail. The patient will call immediately with any shower of floaters, flashes of light or the appearance of a dark curtain in the vision. She also understands to call with any significant change in vision.    Plan:  Discussed post-operative care in detail with patient.   Discussed options for glasses  Call immediately with any worsening of vision, worsening pain or increasing redness. Emergency contact information given.    Followup 8-12 months with optometrist  Patient is scheduled for blepharoplasty with Dr. LAugustin Coupein the near future.       ----------------------  Scribe Disclaimer: This note was scribed by YDenny Levy a trained medical scribe, in the presence of JLivingston Diones M.D.     Provider Disclaimer: This document serves as my personal record of services taken in my presence. It was created on 03/22/2015 on my behalf by YDenny Levy a trained medical scribe. I have reviewed this document and agree that this note accurately reflects the history and exam findings, the patient care provided, and my medical decision making.    03/22/2015  Report electronically signed by:  JLivingston Diones MD   Professor  [facility] Department of Ophthalmology  .

## 2015-03-23 ENCOUNTER — Ambulatory Visit: Payer: Medicare Other | Attending: "Endocrinology

## 2015-03-23 DIAGNOSIS — E119 Type 2 diabetes mellitus without complications: Secondary | ICD-10-CM | POA: Insufficient documentation

## 2015-03-23 DIAGNOSIS — E1129 Type 2 diabetes mellitus with other diabetic kidney complication: Secondary | ICD-10-CM | POA: Insufficient documentation

## 2015-03-23 LAB — URINALYSIS AND CULTURE IF IND
Bilirubin Urine: NEGATIVE
Glucose Urine: >1000 mg/dL
Ketones: NEGATIVE mg/dL
Nitrite Urine: NEGATIVE
Protein Urine: 30 mg/dL — AB
RBC: 10 /HPF — ABNORMAL HIGH (ref 0–5)
Specific Gravity: 1.033 — ABNORMAL HIGH (ref 1.002–1.030)
Squamous EPI: 7 /HPF — ABNORMAL HIGH (ref 0–5)
Trans Epi: <1 /HPF (ref 0–5)
Urobilinogen.: NEGATIVE mg/dL (ref ?–2.0)
WBC: 10 /HPF — ABNORMAL HIGH (ref 0–5)
pH URINE: 5.5 (ref 4.8–7.8)

## 2015-03-23 LAB — MICROALBUMIN
Microalbumin Urine: 10.2 mg/dl
Microalbumin/Creatinine Ratio: 113 mg/g CR — ABNORMAL HIGH (ref <30)

## 2015-03-23 LAB — CREATININE SPOT URINE: Creatinine Spot Urine: 90.24 mg/dL

## 2015-03-24 ENCOUNTER — Other Ambulatory Visit: Payer: Self-pay | Admitting: Family Medicine

## 2015-03-24 LAB — HEMOGLOBIN A1C
Hgb A1C,Glucose Est Avg: 174 mg/dL
Hgb A1C: 7.7 % — ABNORMAL HIGH (ref 3.9–5.6)

## 2015-03-25 LAB — CULTURE URINE, BACTI

## 2015-03-26 NOTE — Telephone Encounter (Signed)
Last seen by pcp 01/31/15

## 2015-03-27 ENCOUNTER — Encounter: Payer: Self-pay | Admitting: "Endocrinology

## 2015-03-27 ENCOUNTER — Ambulatory Visit: Payer: Medicare Other | Admitting: "Endocrinology

## 2015-03-27 DIAGNOSIS — E1165 Type 2 diabetes mellitus with hyperglycemia: Secondary | ICD-10-CM

## 2015-03-27 DIAGNOSIS — E1129 Type 2 diabetes mellitus with other diabetic kidney complication: Secondary | ICD-10-CM

## 2015-03-27 DIAGNOSIS — E119 Type 2 diabetes mellitus without complications: Secondary | ICD-10-CM

## 2015-03-27 MED ORDER — CANAGLIFLOZIN 100 MG TABLET
100.0000 mg | ORAL_TABLET | Freq: Every day | ORAL | 3 refills | Status: DC
Start: 2015-03-27 — End: 2015-10-30

## 2015-03-27 MED ORDER — METFORMIN 1,000 MG TABLET: 1.0000 | ORAL_TABLET | Freq: Two times a day (BID) | ORAL | 3 refills | Status: DC

## 2015-03-27 MED ORDER — BLOOD SUGAR DIAGNOSTIC STRIPS: ORAL_STRIP | 3 refills | Status: DC

## 2015-03-27 NOTE — Patient Instructions (Signed)
1. No changes to medications today    2. Goal to check blood sugar at least 1x every day    3. If you have another yeast infection, please let Dr. Rosary Lively or Dr. [name] know as you may need to stop Invokana and increase glipizide dose    4. Return to clinic in May 2017

## 2015-03-27 NOTE — Nursing Note (Signed)
Patient verified by name and date of birth.   Vital signs assessed, allergies verified, screened for pain and verified pharmacy.   Sheenah Dimitroff Banducci Bristow, MA

## 2015-03-27 NOTE — Progress Notes (Signed)
Endocrinology Follow up clinic note:    Chief Complaint:  "I'm here to follow up on my diabetes."    HPI: Paula Daugherty is a 69yrold female who has a diagnosis of type 2 diabetes mellitus who presents to Endocrinology for follow up. Her history is as follows:  She was initially diagnosed with diabetes around 2000 on routine labs. She has a strong family history of diabetes so she wasn't surprised at this. She was started on Metformin around 2005 and then she was started on insulin in 2014. She was up to 64 units of Lantus at night. However, after making changes to her diet and lifestyle, she was taken off insulin in 2015.        She also has a history of an elevated alk phos level. A GGT level was checked and was found to be elevated. A bone specific alkaline phosphatase was also found to be elevated. PTH level was normal.      Interim History: She returns today for follow up. Her last visit with Endocrinology was on 01/02/2015.  Since this time, she continues to be under significant stress due to helping to care [place] ill [place] and will be going back there again in the next couple of days. This has been very stressful and difficult for her to manage but she is hoping that her mother will agree to move into an assisted living center which will decrease her need to travel as much. She also has been having slightly higher blood glucose readings occasionally which she attributes to increased stress and dietary variability with her travel. However, she has been trying to slowly increase her activity and watch her portion sizes recently.     Current Regimen:     Metformin 1000 mg BID   Invokana 100 mg daily   Glipizide 5 mg daily  Hypoglycemia: None recently  Hyperglycemia: Yes, will sometimes increase to 200s  Meter brought today: Yes  Checks blood glucose: 1x/day (morning)  Home blood glucose numbers per pt's  log:    Blood Glucose:     Pre-breakfast: 115 to 235  Diet and Exercise: Has been working on portion control   Breakfast: Sometimes will skip, cereal with milk and banana, or eggs, coffee (with cream) or hot tea to drink   Lunch: Will eat out most days - Deli sandwiches, pasta, burger   Dinner: Tries to have smaller dinner - shrimp or pasta   Snacks: Will snack most often at bedtime - fruit, sometimes has ice cream or pie if its in the house   Exercise: Very minimal these days due to fatigue, has been trying to be more active around the house  Last eye exam: 04/2014, no retinopathy, f/u 1 year, patient has been following closely with ophthalmology lately due to cataract surgery  Foot exam: Will do today    ROS:  All other systems were reviewed and are negatative except for pertinent positive and negative responses as documented in HPI.     Medications:  Medication reconciliation was performed today.   Current Outpatient Prescriptions on File Prior to Visit   Medication Sig Dispense Refill    Albuterol (PROAIR HFA, PROVENTIL HFA, VENTOLIN HFA) 90 mcg/actuation inhaler Take 1-2 puffs by inhalation every 4 hours if needed. 1 inhaler 1    Amlodipine (NORVASC) 10 mg Tablet TAKE 1 TABLET BY MOUTH EVERY  DAY. 90 tablet 1    Aspirin 81 mg DR Tablet EC Tablet Take 1 tablet by mouth every day. 30 tablet 5    CALCIUM-MAGNESIUM-ZINC PO       canagliflozin (INVOKANA) 100 mg Tablet Take 1 tablet by mouth every day. 90 tablet 3    Cholecalciferol, Vitamin D3, (VITAMIN D-3) 2,000 unit Tablet Take 1 tablet by mouth every day.      Diclofenac (VOLTAREN) 0.1 % Ophthalmic Solution Instill 1 drop into the RIGHT eye 4 times daily. Start 2 days prior to surgery 2.5 mL 3    Escitalopram (LEXAPRO) 20 mg tablet Take 1 tablet by mouth every day. 90 tablet 3    Fluconazole (DIFLUCAN) 150 mg Tablet Take 1 tablet by mouth one time. May repeat after 3 days 2 tablet 0    FLUTICASONE/SALMETEROL (ADVAIR DISKUS INHA) Take  by inhalation.       GlipiZIDE (GLUCOTROL XL) 5 mg SR Tablet Take 1 tablet by mouth every morning with a meal. 90 tablet 3    Inhalational Spacing Device (AEROCHAMBER/FLOWSIGNAL) Spacer To use with inhaler as directed 1 each 1    Losartan (COZAAR) 25 mg Tablet Take 1 tablet by mouth every day. 90 tablet 3    Metformin (GLUCOPHAGE) 1,000 mg tablet Take 1 tablet by mouth 2 times daily with meals. (diabetes) 180 tablet 3    Moxifloxacin (VIGAMOX) 0.5% Ophthalmic Solution Instill 1 drop into the RIGHT eye 4 times daily. Start 2 days prior to surgery 3 mL 1    Omeprazole (PRILOSEC) 20 mg Delayed Release Capsule TAKE ONE CAPSULE BY MOUTH EVERY DAY 90 capsule 1    Oxybutynin (DITROPAN) 5 mg Tablet Take 5 mg by mouth 3 times daily.      Prednisolone Acetate (PRED FORTE, ECONOPRED PLUS) 1% Ophthalmic Suspension Instill 1 drop into the RIGHT eye 4 times daily. Start 2 days prior to surgery 15 mL 1     No current facility-administered medications on file prior to visit.      I did review patient's past medical and family/social history, no changes noted.   PMH:  Past medical history was reviewed from problem list.   Patient Active Problem List   Diagnosis    DM2 (diabetes mellitus, type 2)    Depression with anxiety    Stress at home    Leg cramps-right calf    Right ear pain    Fibromyalgia    HTN (hypertension)    GERD (gastroesophageal reflux disease)    Hyperlipidemia with target LDL less than 70    Vitamin D insufficiency    Abnormal LFTs    Renal cyst ( 6.4 cm cyst left kidney)    Cough    Type 2 diabetes mellitus without complication    Fatty liver    Macroalbuminuric diabetic nephropathy    History of actinic keratoses    Cataract     VITAL SIGNS:  Temp: 36.9 C (98.5 F) (11/22 1346)  Temp src: Temporal (11/22 1346)  Pulse: 73 (11/22 1346)  BP: 134/80 (11/22 1346)  Resp: 16 (11/22 1346)  SpO2: 95 % (11/22 1346)  Height: --  Weight: 83.6 kg (184 lb 4.8 oz) (11/22 1346)    PHYSICAL EXAM:  General Appearance:  healthy, alert, no distress, pleasant affect, cooperative.  Eyes:  conjunctivae and corneas clear. PERRL, EOM's intact. sclerae normal.  Mouth: normal.  Neck:  Neck supple. No adenopathy, thyroid symmetric, normal size.  Heart:  normal rate and regular rhythm, no  murmurs, clicks, or gallops.  Lungs: clear to auscultation.  Abdomen: BS normal.  Abdomen soft, non-tender.  No masses or organomegaly.  Extremities:  no cyanosis, clubbing, or edema.  Foot Exam: normal DP and PT pulses, no trophic changes or ulcerative lesions, normal sensory exam and slightly decreased sensation to monofilament exam.   Skin:  Skin color, texture, turgor normal. No rashes or lesions.  Neuro: Gait normal. No tremor appreciated. Sensation and strength grossly normal.  Mental Status: Appearance/Cooperation: in no apparent distress and well developed and well nourished  Eye Contact: normal  Speech: normal volume, rate, and pitch    LAB TESTS/STUDIES:   I personally reviewed the following laboratory studies.     08/22/2014 08:15 12/06/2014 11:45 03/23/2015 09:04   SODIUM 141     POTASSIUM 4.2     CHLORIDE 103     CARBON DIOXIDE TOTAL 25     UREA NITROGEN, BLOOD (BUN) 17     CREATININE BLOOD 0.82     E-GFR, AFRICAN AMERICAN >60     E-GFR, NON-AFRICAN AMERICAN >60     GLUCOSE 109 (H)     CALCIUM 9.5     PROTEIN 7.7     ALBUMIN 3.9     ALKALINE PHOSPHATASE (ALP) 97     ASPARTATE TRANSAMINASE (AST) 40     BILIRUBIN TOTAL 0.8     ALANINE TRANSFERASE (ALT) 38     URIC ACID, BLOOD 5.7     FASTING YES     CHOLESTEROL 165     TRIGLYCERIDE 134     LDL CHOLESTEROL CALCULATION 87     HDL CHOLESTEROL 51     NON-HDL CHOLESTEROL 114     TOTAL CHOLESTEROL:HDL RATIO 3.2     HGB A1C 6.7 (H) 6.9 (H) 7.7 (H)   HGB A1C,GLUCOSE EST AVG 146 151 174      12/06/2014 11:45   WHITE BLOOD CELL COUNT 8.7   HEMOGLOBIN 13.6   HEMATOCRIT 42.0   MCV 83.1   RDW 14.8 (H)   PLATELET COUNT 234      02/21/2014 13:52 03/23/2015 09:04   CREATININE SPOT URINE 530.26 90.24   MICROALBUMIN  URINE 176.4 10.2   MICROALBUMIN/CREATININE RATIO 333 (H) 113 (H)       Impression: This is a 69yrold female with Diabetes Mellitus Type II who presents to Endocrinology for follow up. Overall, her glycemic control is slightly above goal as based on her most recent A1c of 7.7%. Review of her blood glucose download today, she has not been checking her blood glucose very frequently lately and we discussed the need to increase this monitoring to at least 1x/day, goal to check 2x/day. She was also encouraged to work on increasing exercise as tolerated and make adjustments to her diet, continue to work on decreasing portion size.   She had a recent yeast infection and we discussed the increased risk of genital mycotic infections with Invokana and that if she should have another yeast infection, this medication may need to be discontinued and consider increasing Glipizide dose at this time.   She also has a history of an elevated alk phos level. GGT was elevated which suggests a liver source and she was found to have an enlarged liver with increased echogenicity suggestive of fatty infiltration on abdominal ultrasound in 12/2013. Her last alk phos in 08/2014 was in the normal range.      DIABETES HISTORY  (-) h/o retinopathy.      (-) h/o  microalbuminuria/nephropathy.  (-) h/o neuropathy.  (-) h/o Autonomic dysfunction  (-) h/o Gastroparesis  (-) h/o CAD  (-) h/o PVD     Last eye exam: 04/2014, f/u 1 year   Last urine microalbumin: 03/2015, 113  Pneumovax: 12/2014  (+) ASA  (+) Statin  (+) ACE/ARB             Recommendations:  (E11.29) Type 2 diabetes mellitus with other diabetic kidney complication  (primary encounter diagnosis)  - No changes made to medications today   - Encouraged patient to increase blood glucose monitoring to at least 1x/day, goal to check 2x/day  - Patient will report any further yeast infections -may need to discontinue Invokana use and increase glipizide if has another yeast infection  - Last LDL at  goal, continue statin, repeat fasting lipid panel due 08/2015  - Blood pressure at goal, continue amlodipine, cozaar    - Last microalbumin:creatinine ratio elevated, continue cozaar  - Up to date on screening eye exam, next due 04/2015, f/u with ophthalmology as scheduled    Approximately 30 minutes were spent with patient, greater than 50% of which was spent counseling the patient on diabetes and on coordination of care.     Follow up in May 2017, patient will follow up with PCP and another endocrine provider if indicated in the interim.     If you have any questions, please do not hesitate to contact me at [phone].  Thank you for allowing me to participate in the care of this patient.    EDUCATION:  I educated/instructed the patient or caregiver regarding all aspects of the above stated plan of care.  The patient or caregiver indicated understanding.      [place] interpreter was not used.    Report electronically signed by:  Sunday Spillers, M.D.  Clinical Assistant Professor  Department of Endocrinology  Pager # [phone]

## 2015-04-03 ENCOUNTER — Encounter: Payer: Self-pay | Admitting: "Endocrinology

## 2015-04-09 ENCOUNTER — Encounter: Payer: Self-pay | Admitting: Optometrist

## 2015-04-12 ENCOUNTER — Ambulatory Visit: Payer: Medicare Other

## 2015-04-13 NOTE — Progress Notes (Signed)
Health Management & Education  Did not keep appt    Halford Chessman, RN, CCM  Care Manager  [facility]  [phone]

## 2015-04-16 ENCOUNTER — Ambulatory Visit: Payer: Vision Other Private Insurance | Admitting: Optometrist

## 2015-04-16 ENCOUNTER — Encounter: Payer: Self-pay | Admitting: Family Medicine

## 2015-04-16 DIAGNOSIS — H02834 Dermatochalasis of left upper eyelid: Secondary | ICD-10-CM

## 2015-04-16 DIAGNOSIS — H02831 Dermatochalasis of right upper eyelid: Secondary | ICD-10-CM

## 2015-04-16 DIAGNOSIS — Z961 Presence of intraocular lens: Secondary | ICD-10-CM

## 2015-04-16 DIAGNOSIS — H524 Presbyopia: Secondary | ICD-10-CM

## 2015-04-16 DIAGNOSIS — H02403 Unspecified ptosis of bilateral eyelids: Secondary | ICD-10-CM

## 2015-04-16 DIAGNOSIS — Z09 Encounter for follow-up examination after completed treatment for conditions other than malignant neoplasm: Secondary | ICD-10-CM

## 2015-04-16 NOTE — Progress Notes (Signed)
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  No evidence of diabetic retinopathy nor clinically significant macular edema was present in either eye.  Hyperopic presbyope.    Patient Active Problem List   Diagnosis    DM2 (diabetes mellitus, type 2)    Depression with anxiety    Stress at home    Leg cramps-right calf    Right ear pain    Fibromyalgia    HTN (hypertension)    GERD (gastroesophageal reflux disease)    Hyperlipidemia with target LDL less than 70    Vitamin D insufficiency    Abnormal LFTs    Renal cyst ( 6.4 cm cyst left kidney)    Cough    Type 2 diabetes mellitus without complication    Fatty liver    Macroalbuminuric diabetic nephropathy    History of actinic keratoses    Cataract

## 2015-04-16 NOTE — Progress Notes (Signed)
Chief Complaint   Patient presents with    Eye Glasses / Contact Lens Rx     [phone] female here for exam post cataract surgery; CC: Patient states she only needs glasses to read. Wants glasses she can wear all the time. Pt is more bothered by light now     Assessment/Plan:    1. Status post cataract extraction OU. Stable.     2. Presbyopia. Pt wishes to continue with bifocals, as doesn't like to take glasses on and off in the day. Released a glasses prescription to the patient.    3. Ptosis and dermatochalasis OU. Pt has appointment with Dr. Augustin Coupe.    Return to clinic in one year for a general eye examination or sooner if any changes are noticed.    Paula Daugherty, O.D, F.A.A.O.  Energy manager of Ophthalmology

## 2015-04-16 NOTE — Telephone Encounter (Signed)
From: [name]  To: [name], MD  Sent: 04/16/2015 9:23 AM PST  Subject: Visit Follow-up Question    Hi Dr. Rosanna Randy you to know I cancelled my appointment this Wednesday. It was for a BP f/u but life is so crazy I just can't get there. Been to [place] twice since I last saw you. Been monitoring it and has been in line. Runs around 128/75 give or take a couple of points. Hope you understand. Take care and have good holidays.     Thank you, [name]

## 2015-04-18 ENCOUNTER — Ambulatory Visit: Payer: Self-pay

## 2015-04-18 ENCOUNTER — Ambulatory Visit: Payer: Self-pay | Admitting: Family Medicine

## 2015-04-25 ENCOUNTER — Ambulatory Visit: Payer: Medicare Other | Admitting: Ophthalmology

## 2015-04-25 ENCOUNTER — Encounter: Payer: Self-pay | Admitting: Ophthalmology

## 2015-04-25 NOTE — Progress Notes (Signed)
For documention:    Patient was scheduled in a 915 am slot.    Pt arrived at 75 claiming she was "told" that her appointment was at 1115 am.    I do not even have an 1115 am slot on my template so it is highly unlikely she was given such a time.    When asked to r/s given that she was 2 hours late, patient became irate with front desk staff and technicians.    Nyra Jabs, MD  Ophthalmic Plastic and Orbital Surgery  Dept. of Ophthalmology & Visual Science

## 2015-04-26 ENCOUNTER — Telehealth: Payer: Self-pay | Admitting: Family Medicine

## 2015-04-26 NOTE — Telephone Encounter (Signed)
Karron Goens is a 69yrold female    3 patient identifiers used.    Per:   patient    Reason for Call:  Ankle injury    Symptoms:    Patient reports that she Tripped and fell this morning around 30 minutes ago, she hit her  leg,  ankle and head , she did not loose consciousness, felt nauseous after the fall but that has resolved now, denies any bumps or bruises to the head, she reports that now her  right ankle is swollen and now hard to walk on it and painful. Denies any open wound, foot is warm to touch, she is wearing nail polish so she could not check for cap refill. Able to move ankle, denies any obvious deformity, she would like to know what to do.    Homecare and/or Medications given:  ice    Advice:  Advice given to patient per [facility] ankle injury Protocol. Instructed in home care advice and callback symptoms were reviewed.      Pain: yes    Pain location and 1-10:  Right ankle           Disposition: Advice given per [facility] ankle injury protocol    Per:   patient verbalizes agreement to plan. Agrees to callback with any increase in symptoms/concerns or questions.     MGwenlyn Perking RN  PCN Triage  [phone]

## 2015-05-01 ENCOUNTER — Ambulatory Visit: Payer: Medicare Other | Attending: Neurology | Admitting: Neurology

## 2015-05-01 ENCOUNTER — Encounter: Payer: Self-pay | Admitting: Neurology

## 2015-05-01 VITALS — BP 146/84 | HR 65 | Temp 97.4°F | Resp 16 | Ht 62.0 in | Wt 184.3 lb

## 2015-05-01 DIAGNOSIS — S93401A Sprain of unspecified ligament of right ankle, initial encounter: Secondary | ICD-10-CM | POA: Insufficient documentation

## 2015-05-01 DIAGNOSIS — G4710 Hypersomnia, unspecified: Secondary | ICD-10-CM | POA: Insufficient documentation

## 2015-05-01 MED ORDER — MODAFINIL 100 MG TABLET
50.0000 mg | ORAL_TABLET | Freq: Every day | ORAL | 0 refills | Status: DC
Start: 2015-05-01 — End: 2015-07-08

## 2015-05-01 NOTE — Patient Instructions (Signed)
You have daytime sleepiness and recommend the following:  - Have a consistent rise time at 8 am.  - Within 15 minutes go outside and obtain natural sunlight and exercise as much as you can for 1 hour.  - Eat a hearty breakfast.  - Scheduled naps at 10 am and 2 pm for 15 minutes - 1 hour (at most).  - Take modafinil 50 mg in the morning daily.     - Obtain roll walker for ankle sprain.    - Follow-up in the Sleep Clinic in 3 months.

## 2015-05-01 NOTE — Nursing Note (Signed)
Vital signs taken, allergies verified, screened for pain. Preferred pharmacy selected. Refills needed? No.Avram Danielson Maurene Capes MA II    Neck cir: 38.5cm. Peterson Lombard MA II
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
## 2015-05-02 ENCOUNTER — Telehealth: Payer: Self-pay | Admitting: Neurology

## 2015-05-02 DIAGNOSIS — F329 Major depressive disorder, single episode, unspecified: Principal | ICD-10-CM

## 2015-05-02 DIAGNOSIS — G4719 Other hypersomnia: Secondary | ICD-10-CM

## 2015-05-02 DIAGNOSIS — F32A Depression, unspecified: Secondary | ICD-10-CM

## 2015-05-02 NOTE — Telephone Encounter (Signed)
Pharmacy is requesting prior auth for Modafinil (PROVIGIL) 100 mg Tablet. Pt called in to verify if we have received forms sent to neurology by pharmacy. Pt is stating she is in need of this medication before she leaves town for vacation beginning Sunday, jan. 1, 2017.     Anderson Malta I. [place], [place]   Neuro[facility] Clinic  Patient line: [phone]  Backline: [phone]

## 2015-05-03 NOTE — Telephone Encounter (Signed)
[facility] [phone] DENIED the PA for Modafinil (PROVIGIL) 157m Tablet due to patient diagnosis: hypersomnia. FDA approval only for Narcolepsy confirm by sleep lab eval, Shift work disorder, and OSA confirm by polysomnography. Message sent to MD for determination, appeals option is available-forms place in MD box. Member ID# [id number]    [facility]

## 2015-05-08 ENCOUNTER — Other Ambulatory Visit: Payer: Self-pay

## 2015-05-08 NOTE — Telephone Encounter (Signed)
Left a message for the patient. Will either pursue changing Lexapro to a more wake-promoting antidepressant such as [name] or Cymbalta or proceed with PSG + MSLT for type 2 narcolepsy qualification. Waiting for call back.    Jenita Seashore, MD  Neurology / Sleep Medicine

## 2015-05-08 NOTE — Progress Notes (Signed)
Health Management and Education      Reviewed the pertinent portion of patients chart. Sent mychart message for Patient, requesting return call to schedule phone appointment. Awaiting Call back.       Electronically signed by   Keturah Barre, [place] [employer], Care Manager   Health Management and Education  [phone]

## 2015-05-18 ENCOUNTER — Other Ambulatory Visit: Payer: Self-pay | Admitting: Family Medicine

## 2015-05-18 DIAGNOSIS — F418 Other specified anxiety disorders: Secondary | ICD-10-CM

## 2015-05-18 NOTE — Telephone Encounter (Signed)
Last office visit: 03-07-15 with PCP  Please advise.  Paula Daugherty, [place]

## 2015-05-30 NOTE — Progress Notes (Signed)
IMPROVING HEALTH IN DIABETES STUDY: INTERVENTION SUMMARY     Patient Name:  Paula Daugherty  Coaches Name: Anette Riedel, RN  Intervention Type: Health Coaching w/ RN   Technology Utilized: Gar[employer] smartwatch fitness tracker & MyFitnessPal smartphone food diary  Intervention Length:  12/20/14-03/21/15     Motivation for Change    Improved health, lose weight and have better blood sugar control  Specific Goal Focus:    ACTIVITY:   o Increase steps to 3,000 per day, 5 days per week   Nutrition:   o Reduce snacking and sweets to 2 nights a week  Motivators/Facilitators   Family - daughter, husband and grandson (37yo) have been very supportive     Stocking health snacks at home   Changing to single serve options of sweets - don't bring sweets home. Barriers   Fatigue/health concerns    Easy access to snacks     Overall Goal Success    Patient rates success as 80%   Has incorporated more walking into everyday activities. Was consistently achieving goal of 3,000 by the end of the study.    [name] reports making healthier food choices and changing her buying patterns to have less sweets at home.   Feeling more motivated to get up and do things during the day.      Latest Lab Results   Lab Name Value Date/Time     HGBA1C 6.9 12/06/2014      HGBA1C 7.7 03/23/2015         Study Related Measures  Baseline  Following Intervention   Self Rated Health   (Excellent, Very Good, Good, Fair, Poor) Fair Poor   PHQ-9 (Measure of Depression)  11 11        Anette Riedel, RN  RN Health Coach  Improving Health in Diabetes Study   Phone: [phone]

## 2015-06-06 ENCOUNTER — Encounter: Payer: Self-pay | Admitting: Family Medicine

## 2015-06-06 ENCOUNTER — Ambulatory Visit
Admission: RE | Admit: 2015-06-06 | Discharge: 2015-06-06 | Disposition: A | Discharge status: Home / Self Care | Payer: Medicare Other | Source: Ambulatory Visit | Attending: Family Medicine | Admitting: Family Medicine

## 2015-06-06 ENCOUNTER — Ambulatory Visit (INDEPENDENT_AMBULATORY_CARE_PROVIDER_SITE_OTHER): Payer: Medicare Other | Admitting: Family Medicine

## 2015-06-06 ENCOUNTER — Ambulatory Visit (INDEPENDENT_AMBULATORY_CARE_PROVIDER_SITE_OTHER)
Admission: RE | Admit: 2015-06-06 | Discharge: 2015-06-06 | Disposition: A | Discharge status: Home / Self Care | Payer: Medicare Other | Source: Ambulatory Visit | Attending: Family Medicine | Admitting: Family Medicine

## 2015-06-06 VITALS — BP 128/74 | HR 68 | Temp 97.6°F | Wt 178.0 lb

## 2015-06-06 DIAGNOSIS — E1165 Type 2 diabetes mellitus with hyperglycemia: Secondary | ICD-10-CM

## 2015-06-06 DIAGNOSIS — S8264XA Nondisplaced fracture of lateral malleolus of right fibula, initial encounter for closed fracture: Secondary | ICD-10-CM

## 2015-06-06 DIAGNOSIS — M25571 Pain in right ankle and joints of right foot: Secondary | ICD-10-CM

## 2015-06-06 DIAGNOSIS — F439 Reaction to severe stress, unspecified: Secondary | ICD-10-CM

## 2015-06-06 DIAGNOSIS — E1129 Type 2 diabetes mellitus with other diabetic kidney complication: Secondary | ICD-10-CM

## 2015-06-06 NOTE — Patient Instructions (Addendum)
An X-ray has been ordered for you. Please go to the front desk at [address], [place] to obtain your x-ray. You will receive a letter or telephone call with your results.     A laboratory test has been ordered for you--due after mid Feb.    You have been referred to a specialist. We are pleased to offer the services of [facility] specialists located at the [facility] or [place] sites. Please allow 7 business days (3 if urgent) for the referral to be processed. After that time you may call: Orthopedics: [address], [phone]

## 2015-06-06 NOTE — Nursing Note (Signed)
Vital signs taken, allergies and pharmacy verified. Screened for pain. Med list given to patient for review.

## 2015-06-06 NOTE — Nursing Note (Signed)
The patient was given Durable Medical Equipment in office: solar air walker   Pt informed of possible charges per their insurance plan.   The patient consented.   Order and DME paperwork completed and given to the referral coordinator.

## 2015-06-06 NOTE — Progress Notes (Signed)
Paula Daugherty is a 69yrold female patient with multiple medical problems here for follow-up with PCP  Chief Complaint   Patient presents with    Other     3 mo f/u     1. Right ankle pain: reports since fall 04/27/2015--she reports possible twisting of ankle; better with elevation; worse with increased activity but able to bear weight   2. DM2: recent increase in A1c; declines increase in Glipizide; agrees to DM class for now   3. Stress at home: elderly mother [place] to see her; no SI or HI; working with [place] the great care       ROS:   .Constitutional: no fever/chills.  CV: no chest pain, palpitations, shortness of breath or edema  No current yeast infection symptoms     Past Medical History   Diagnosis Date    Asthma     Diabetes mellitus     Hypertension        Current Outpatient Prescriptions on File Prior to Visit   Medication Sig Dispense Refill    Amlodipine (NORVASC) 10 mg Tablet TAKE 1 TABLET BY MOUTH EVERY DAY. 90 tablet 1    Aspirin 81 mg DR Tablet EC Tablet Take 1 tablet by mouth every day. 30 tablet 5    Blood Sugar Diagnostic (ONETOUCH ULTRA TEST) Strips Use for testing blood glucose 3 times per day 300 strip 3    CALCIUM-MAGNESIUM-ZINC PO       canagliflozin (INVOKANA) 100 mg Tablet Take 1 tablet by mouth every day. 90 tablet 3    Cholecalciferol, Vitamin D3, (VITAMIN D-3) 2,000 unit Tablet Take 1 tablet by mouth every day.      Escitalopram (LEXAPRO) 20 mg tablet TAKE 1 TABLET BY MOUTH EVERY DAY. 90 tablet 1    FLUTICASONE/SALMETEROL (ADVAIR DISKUS INHA) Take  by inhalation.      GlipiZIDE (GLUCOTROL XL) 5 mg SR Tablet Take 1 tablet by mouth every morning with a meal. 90 tablet 3    Metformin (GLUCOPHAGE) 1,000 mg tablet Take 1 tablet by mouth 2 times daily with meals. (diabetes) 180 tablet 3    Omeprazole (PRILOSEC) 20 mg Delayed Release Capsule TAKE ONE CAPSULE BY MOUTH EVERY DAY 90 capsule 1    Oxybutynin (DITROPAN) 5 mg Tablet Take 5  mg by mouth 3 times daily.       No current facility-administered medications on file prior to visit.      Allergies:   Allergies   Allergen Reactions    Contrast Dye [Radiopaque Agent] Hives    Hydrochlorothiazide Other-Reaction in Comments     Patient reports    Januvia [Sitagliptin] Other-Reaction in Comments     Patient reports    Lyrica [Pregabalin] Other-Reaction in Comments     Patient reports    Omnipaque [Iohexol] Other-Reaction in Comments     Patient reports    Prozac [Fluoxetine Hcl] Other-Reaction in Comments     Patient reports    Sulfa (Sulfonamide Antibiotics) Rash    Topiramate Other-Reaction in Comments     Patient reports       Social History     Social History    Marital status: MARRIED     Spouse name: N/A    Number of children: 1    Years of education: N/A     Occupational History    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[employer]      Social History Main Topics  Smoking status: Former Smoker     Packs/day: 0.10     Years: 20.00    Smokeless tobacco: Never Used      Comment: quit 30 years ago    Alcohol use 0.0 oz/week     0 Standard drinks or equivalent per week      Comment: rare shares a beer or glass wine every 2-3 months     Drug use: No    Sexual activity: Yes     Partners: Male     Other Topics Concern    None     Social History Narrative     Family History   Problem Relation Age of Onset    Diabetes Father     Heart Mother      MI at 1s     Non-contributory Brother     Non-contributory Brother        PE:  BP 128/74  Pulse 68  Temp 36.4 C (97.6 F) (Temporal)  Wt 80.7 kg (178 lb)  LMP 05/06/1983  BMI 32.56 kg/m2  .General Appearance: healthy, alert, no distress, pleasant affect, cooperative.  Extremities:  no cyanosis, clubbing, or edema.  Skin:  Right foot and ankle: Skin color, texture, turgor normal. No rashes or lesions.  Neuro: right LE seems NVI.  Musculoskeletal: right foot and ankle without obvious deformity, stable ankle with normal gait; tenderness to palpation  along distal lateral malleolus     Assessment and Plan:  (S82.64XA) Closed nondisplaced fracture of lateral malleolus of right fibula, initial encounter  (primary encounter diagnosis)  Comment: possible callus formation per my read, stable ankle and able to bear weight; patient amenable to see ortho due to DM and possible complications and wear walking boot   Plan: DURABLE MEDICAL EQUIPMENT, ORTHOPEDIC-GENERAL         REFERRAL          (M25.571) Acute right ankle pain  Comment: same  Plan: ANKLE 3+ VIEWS, RIGHT, FOOT 3+ VIEWS, RIGHT          (E11.65) Type 2 diabetes mellitus with hyperglycemia, without long-term current use of insulin  Comment: recent elevated A1c; patient declined increase in glipizide for now; on ARB   Plan: [place]          (F43.9) Stress at home  Comment: elderly mother living in [place]: continue working with Cathren Laine Goode--we appreciate the great care     Total encounter time including history, physical examination, and coordination of care was approximately 40 minutes of which more than 50% was spent counseling regarding assessment/diagnosis and treatment plan. No guarantees were made regarding her medical care or treatment outcome. Barriers to Learning: none.  Patient verbalizes understanding of teaching and instructions.    Electronically signed by:    [name], MD  [place], [place] [employer]  Associate physician [facility] [place]   [phone]

## 2015-06-07 ENCOUNTER — Ambulatory Visit: Payer: Medicare Other | Admitting: Ophthalmology

## 2015-06-07 DIAGNOSIS — H02836 Dermatochalasis of left eye, unspecified eyelid: Secondary | ICD-10-CM

## 2015-06-07 DIAGNOSIS — H02834 Dermatochalasis of left upper eyelid: Secondary | ICD-10-CM

## 2015-06-07 DIAGNOSIS — H02403 Unspecified ptosis of bilateral eyelids: Secondary | ICD-10-CM

## 2015-06-07 DIAGNOSIS — H02833 Dermatochalasis of right eye, unspecified eyelid: Secondary | ICD-10-CM

## 2015-06-07 DIAGNOSIS — H02831 Dermatochalasis of right upper eyelid: Secondary | ICD-10-CM

## 2015-06-07 NOTE — Progress Notes (Signed)
Chief Complaint   Patient presents with    Follow Up With Specialist     Possible Pre-op for Ptosis repair/bleph.      Paula Daugherty is a 70yrold female noted to have ptosis and dermatochalasis that was visually significant last year. Was unable to have surgery last year due to multiple health issues and family events. Here because she has noted progressive lid droop and feels she needs the eyelid surgery in order to drive.    Exam notable for ptosis and dermatochalasis OU    Impression  Ptosis OU  Dermatochalasis OU    Plan  MMCR OU (4 mm mark OU)  Upper bleph OU    I confirm that I reviewed the patient's medical history and other pertinent data, and personally examined the patient.     I agree with the exam findings, treatment plan, and medical decision making reported by the resident, with any exceptions as noted above.  LNyra Jabs MD  Ophthalmic Plastic and Orbital Surgery  Dept. of Ophthalmology & Visual Science

## 2015-06-07 NOTE — Procedures (Signed)
[facility] performed in both eyes at request of Dr. Augustin Coupe.   Patient instructed in the proper performance of the test and all her questions answered.  Patient's current refraction was not used.  Lids were un-taped and taped for this test.  Reliability: A  Abran Richard

## 2015-06-07 NOTE — Progress Notes (Signed)
Ophthalmic Plastic and Orbital Surgery Service  Resident Note     History of Present Illness:  Paula Daugherty is a 70yrold female who presents dermatochalasis eval. The patient reports that eyelids have remained droopy and now that cataract surgery is complete she would like to proceed with blepharoplasty. Has been busy lately with flying out of state to care for her ailing mother, caring for her poorly controlled diabetic son, and breaking her ankle around christmas time. She would still like to proceed with blepharoplasty now.      External examination:  Brow is normal position at superior orbital rim, without frontalis overaction.  Dermatochalasis upper lids both eyes without fat prolapse and minimal lateral hooding  No true ptosis  No floppy eyelid   Pupils are equal without evidence of Horner's.  Extraocular motility are full OU.   The globe position is without dystopia, enophthalmos, or proptosis.  Orbicular tone normal   Bell's intact    Studies:  Visual field superior 36:  OD: 30 degree superior cut on visual field testing on the right eye  OS: 30 degree superior cut on visual field testing on the left eye    Impression:   1. Dermatochalasis, OU  - visually significant upper dermatochalasis OU  - patient would like to have surgery    Plan:   1. Schedule functional upper lid blepharoplasty OU    Please see Dr. LAron Babanote for the final impressions and plan.      IGovernor Rooks MD   Ophthalmology PGY-4  [place] Pager: [phone]

## 2015-06-07 NOTE — Procedures (Signed)
Visual field testing was performed in each eye, with the eyelids taped and untaped.  Testing revealed a superior visual field defect ou which was within 30 degrees of fixation OU. The defect involved nearly 50% of the superior visual field in both eyes.  On taped testing, there was improvement of the superior field deficit, and restoration of the normal central visual field in both eyes.  Electronically signed by : Abas Leicht Koo Amiliah Campisi, MD, Attending Ophthalmologist, [place] [id number]

## 2015-06-14 ENCOUNTER — Ambulatory Visit: Payer: Medicare Other | Attending: Orthopaedic Surgery | Admitting: Orthopaedic Surgery

## 2015-06-14 VITALS — BP 128/52 | HR 72 | Temp 97.5°F | Ht 62.0 in | Wt 184.0 lb

## 2015-06-14 DIAGNOSIS — S82891A Other fracture of right lower leg, initial encounter for closed fracture: Secondary | ICD-10-CM | POA: Insufficient documentation

## 2015-06-14 DIAGNOSIS — S82831A Other fracture of upper and lower end of right fibula, initial encounter for closed fracture: Secondary | ICD-10-CM

## 2015-06-14 NOTE — Nursing Note (Signed)
PT was fit for ASO ankle brace, and therabands issues  Paula Daugherty, [facility]

## 2015-06-14 NOTE — Progress Notes (Signed)
Orthopaedic New Patient Visit- Foot & Ankle    Dear Dr. [name], MD:    HPI: I had the pleasure of seeing your patient, Paula Daugherty, in my orthopaedic clinic today for evaluation of right foot/ankle pain.  Enclosed please find my clinic note dated 06/14/2015  and recommendations.  If there are any questions, please do not hesitate to contact me.    Paula Daugherty is a 29yrfemale who sustained a right ankle injury 04/27/15, 7 week(s) ago. Mechanism of injury:  Fell and lateral ankle hit ground. Immediate symptoms at the time of injury were: immediate pain, immediate swelling, difficulty bearing weight directly after injury. Symptoms have been improving since that time.  Patient was not seen in the ED.  The patient was not treated in a splint.  Pain is located in her lateral ankle.  It is aching, burning.  Increased with too much walking.  Better with elevation.  Also better with the boot that she got last week.  Initially treated in a ankle brace.  Pain score 3-4/10.  No numbness or tingling.  Has been taking Tylenol.  Initially had a great deal of swelling or bruising.    Past Medical History   Diagnosis Date    Asthma     Diabetes mellitus     Hypertension      Past Surgical History   Procedure Laterality Date    Pr ligate fallopian tube       Tubal ligation    Pr total abdom hysterectomy  1980s     partial; no BSO    Pr knee scope,diagnostic  1980s     Knee arthroscopy    Pr repair tympanic membrane       Tympanoplasty    Colonoscopy  01/04/14     polyp--TA and erosion; hemorrhoids; tics; repeat x 3 yrs    Reduction breast  1985    Phacoemulsification Right 02/12/2015     with IOL     Family history was reviewed and is noncontributory.  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       Social History Main Topics    Smoking status: Former Smoker      Packs/day: 0.10     Years: 20.00    Smokeless tobacco: Never Used      Comment: quit 30 years ago    Alcohol use 0.0 oz/week     0 Standard drinks or equivalent per week      Comment: rare shares a beer or glass wine every 2-3 months     Drug use: No    Sexual activity: Yes     Partners: Male     Other Topics Concern    Not on file     Social History Narrative     Allergies   Allergen Reactions    Contrast Dye [Radiopaque Agent] Hives    Hydrochlorothiazide Other-Reaction in Comments     Patient reports    Januvia [Sitagliptin] Other-Reaction in Comments     Patient reports    Lyrica [Pregabalin] Other-Reaction in Comments     Patient reports    Omnipaque [Iohexol] Other-Reaction in Comments     Patient reports    Prozac [Fluoxetine Hcl] Other-Reaction in Comments  Patient reports    Sulfa (Sulfonamide Antibiotics) Rash    Topiramate Other-Reaction in Comments     Patient reports       Current Outpatient Prescriptions:     Amlodipine (NORVASC) 10 mg Tablet, TAKE 1 TABLET BY MOUTH EVERY DAY., Disp: 90 tablet, Rfl: 1    Aspirin 81 mg DR Tablet EC Tablet, Take 1 tablet by mouth every day., Disp: 30 tablet, Rfl: 5    Blood Sugar Diagnostic (ONETOUCH ULTRA TEST) Strips, Use for testing blood glucose 3 times per day, Disp: 300 strip, Rfl: 3    CALCIUM-MAGNESIUM-ZINC PO, , Disp: , Rfl:     canagliflozin (INVOKANA) 100 mg Tablet, Take 1 tablet by mouth every day., Disp: 90 tablet, Rfl: 3    Cholecalciferol, Vitamin D3, (VITAMIN D-3) 2,000 unit Tablet, Take 1 tablet by mouth every day., Disp: , Rfl:     Escitalopram (LEXAPRO) 20 mg tablet, TAKE 1 TABLET BY MOUTH EVERY DAY., Disp: 90 tablet, Rfl: 1    FLUTICASONE/SALMETEROL (ADVAIR DISKUS INHA), Take  by inhalation., Disp: , Rfl:     GlipiZIDE (GLUCOTROL XL) 5 mg SR Tablet, Take 1 tablet by mouth every morning with a meal., Disp: 90 tablet, Rfl: 3    Losartan (COZAAR) 25 mg Tablet, Take 1 tablet by mouth every day., Disp: , Rfl:     Metformin  (GLUCOPHAGE) 1,000 mg tablet, Take 1 tablet by mouth 2 times daily with meals. (diabetes), Disp: 180 tablet, Rfl: 3    Omeprazole (PRILOSEC) 20 mg Delayed Release Capsule, TAKE ONE CAPSULE BY MOUTH EVERY DAY, Disp: 90 capsule, Rfl: 1    Oxybutynin (DITROPAN) 5 mg Tablet, Take 5 mg by mouth 3 times daily., Disp: , Rfl:     REVIEW OF SYSTEMS  A complete review of systems was performed and all pertinent points were found to be negative except as above in the HPI.     Please see HPI for further information.    Physical Exam:  Blood pressure 128/52, pulse 72, temperature 36.4 C (97.5 F), temperature source Tympanic, height 1.575 m (5' 2" ), weight 83.5 kg (184 lb), last menstrual period 05/06/1983.  Appearance: in no apparent distress, alert, oriented times 3, well groomed and dressed and cooperative  2+ pulse    Foot/ankle exam:  No skin lesions noted on lower extremity  Capillary refill intact all toes  Ecchymosis- negative    Effusion  moderate      Range of motion:   Not tested   mild pain with range of motion   Muscle Strength: Tib Ant, EHL, FHL, Gastroc/soleus, Peroneals, Posterior tib= intact    Sensation intact to light touch L3-S1     Imaging:Radiographs obtained that I independently reviewed demonstrate small distal fibula fracture, nondisplaced, with healing      ASSESSMENT:   1. Ankle fracture, right, closed, initial encounter     right ankle distal fibular fracture x 7 weeks    Plan:  - Discussed injury and healing course with the patient today.  She may continue weightbearing as tolerated and wean out of the boot as tolerated.  I provided her with a ankle support brace that she may also transition to for comfort.  She was also given ankle exercises to do on her own.  She'll followup in 6 weeks at which time x-rays should be taken of her right ankle.  She should continue elevation as needed.    Thank you for this consultation.    Electronically signed by:  Luvenia Heller, MD  Orthopaedic Surgery

## 2015-06-19 ENCOUNTER — Encounter: Payer: Medicare Other | Admitting: Ophthalmology

## 2015-06-19 ENCOUNTER — Other Ambulatory Visit: Payer: Self-pay

## 2015-06-19 NOTE — Progress Notes (Signed)
Healthcare Management and Education:     Care Coordination Telephone Follow Up Note; 3 identifiers used     Patient Reports: patient called back. She states her Mother with memory issues who lives in the mid [place], has been difficult to secure care for. She states she is forgetful, supicious, and fired the most recently caregivers. She states that she went back to assist with this, but is now home trying to give herself permission to practice self care. Patient has an injured extremity and also plans to have eye surgery. [place]. Patient reports interest in accessing support for situation with her mother, but continues to state she is out of town so much she cannot look for a therapist.    Patient was agreeable to consider online support groups and information from [employer].     Provided above resources and encouraged the patient to follow up. Patient has no other needs at this time.     Minutes:  35    Electronically signed by   Keturah Barre, [place] Work, [place] Management and Education  [phone]

## 2015-06-19 NOTE — Progress Notes (Signed)
Health Management and Education      Sent mychart to patient and she did not reply for further resources. Per chart review patient recently incurred a lower extremity injury. Called patient to follow and reached voice mail. Left message requesting return call. Awaiting call back.      Electronically signed by   Keturah Barre, [place] Work, Care Manager   Health Management and Education  [phone]

## 2015-06-23 ENCOUNTER — Other Ambulatory Visit: Payer: Self-pay | Admitting: Family Medicine

## 2015-06-23 DIAGNOSIS — E1129 Type 2 diabetes mellitus with other diabetic kidney complication: Secondary | ICD-10-CM

## 2015-06-26 NOTE — Telephone Encounter (Signed)
Lats seen 06/06/15

## 2015-06-29 NOTE — Telephone Encounter (Signed)
Pt called back.  She had been out of town for a month caring for her mom and also was dealing with a broken ankle.    Relayed message from Dr. Donnamarie Poag.  She would like to proceed with either [name] or Cymbalta, whichever Dr. Donnamarie Poag recommends.    Selinda Flavin Belkys Henault RN/neurology

## 2015-07-08 ENCOUNTER — Encounter: Payer: Self-pay | Admitting: Neurology

## 2015-07-08 MED ORDER — VENLAFAXINE 37.5 MG TABLET
37.5000 mg | ORAL_TABLET | Freq: Every day | ORAL | 2 refills | Status: DC
Start: 2015-07-08 — End: 2015-08-26

## 2015-07-08 NOTE — Telephone Encounter (Signed)
Neuro / Sleep Med Chart Update 07/08/15:  Patient with excessive daytime sleepiness. Normal sleep study. Patient with a history of depression on Lexapro. Favor a more wake-promoting antidepressant. Rx provided for [name] 37.5 mg daily. Lexapro discontinued. Discussed risks, benefits, and alternatives to therapy.     Jenita Seashore, MD  Neuro / Sleep Med

## 2015-07-08 NOTE — Addendum Note (Signed)
Addended by: Jenita Seashore on: 07/08/2015 03:18 PM     Modules accepted: Orders

## 2015-07-11 ENCOUNTER — Ambulatory Visit: Payer: Medicare Other | Admitting: Ophthalmology

## 2015-07-16 ENCOUNTER — Encounter: Payer: Self-pay | Admitting: Family Medicine

## 2015-07-16 NOTE — Telephone Encounter (Signed)
From: [name]  To: [name], MD  Sent: 07/15/2015 8:28 PM PDT  Subject: Referral Question    Hi Dr. Just wanted you to know that Dr Donnamarie Poag took me off Lexapro and started me on Efflexor 37.5 mg qd. The changes starred on Friday the 10th. I am hoping that it will help me feel better. Hope all is well with you. Take care     Thank you, [name]

## 2015-07-18 ENCOUNTER — Encounter: Payer: Medicare Other | Admitting: Ophthalmology

## 2015-07-18 ENCOUNTER — Encounter: Payer: Self-pay | Admitting: Family Medicine

## 2015-07-18 NOTE — Telephone Encounter (Signed)
From: [name]  To: [name], MD  Sent: 07/18/2015 10:38 AM PDT  Subject: [place], I think I have a URI again. Slight fever comes and goes, productive cough, headache, sleeping a lot, seems to be contained in my chest. Started Saturday has has gotten worse. I have surgery on the 3rd and I can't be sick. Can you give me something?

## 2015-07-19 ENCOUNTER — Ambulatory Visit: Payer: Medicare Other | Admitting: Adult Medicine

## 2015-07-19 VITALS — BP 132/75 | HR 85 | Temp 97.4°F | Resp 16 | Wt 177.4 lb

## 2015-07-19 DIAGNOSIS — B9789 Other viral agents as the cause of diseases classified elsewhere: Secondary | ICD-10-CM

## 2015-07-19 DIAGNOSIS — J069 Acute upper respiratory infection, unspecified: Secondary | ICD-10-CM

## 2015-07-19 MED ORDER — PROMETHAZINE-DM 6.25 MG-15 MG/5 ML ORAL SYRUP
5.0000 mL | ORAL_SOLUTION | Freq: Every day | ORAL | 0 refills | Status: AC | PRN
Start: 2015-07-19 — End: 2015-08-02

## 2015-07-19 NOTE — Patient Instructions (Addendum)
Viral Infections: Care Instructions  Your Care Instructions  You don't feel well, but it's not clear what's causing it. You may have a viral infection. Viruses cause many illnesses, such as the common cold, influenza, fever, rashes, and the diarrhea, nausea, and vomiting that are often called "stomach flu." You may wonder if antibiotic medicines could make you feel better. But antibiotics only treat infections caused by bacteria. They don't work on viruses.  The good news is that viral infections usually aren't serious. Most will go away in a few days without medical treatment. In the meantime, there are a few things you can do to make yourself more comfortable.  Follow-up care is a key part of your treatment and safety. Be sure to make and go to all appointments, and call your doctor if you are having problems. It's also a good idea to know your test results and keep a list of the medicines you take.  How can you care for yourself at home?  · Get plenty of rest if you feel tired.  · Take an over-the-counter pain medicine if needed, such as acetaminophen (Tylenol), ibuprofen (Advil, Motrin), or naproxen (Aleve). Read and follow all instructions on the label.  · Be careful when taking over-the-counter cold or flu medicines and Tylenol at the same time. Many of these medicines have acetaminophen, which is Tylenol. Read the labels to make sure that you are not taking more than the recommended dose. Too much acetaminophen (Tylenol) can be harmful.  · Drink plenty of fluids, enough so that your urine is light yellow or clear like water. If you have kidney, heart, or liver disease and have to limit fluids, talk with your doctor before you increase the amount of fluids you drink.  · Stay home from work, school, and other public places while you have a fever.  When should you call for help?  Call 911 anytime you think you may need emergency care. For example, call if:  · You have severe trouble breathing.   · You passed out (lost consciousness).  Call your doctor now or seek immediate medical care if:  · You seem to be getting much sicker.  · You have a new or higher fever.  · You have blood in your stools.  · You have new belly pain, or your pain gets worse.  · You have a new rash.  Watch closely for changes in your health, and be sure to contact your doctor if:  · You start to get better and then get worse.  · You do not get better as expected.   Where can you learn more?   Go to https://www.healthwise.net/patiented  Enter L906 in the search box to learn more about "Viral Infections: Care Instructions."   © 2006-2015 Healthwise, Incorporated. Care instructions adapted under license by [facility]. This care instruction is for use with your licensed healthcare professional. If you have questions about a medical condition or this instruction, always ask your healthcare professional. Healthwise, Incorporated disclaims any warranty or liability for your use of this information.  Content Version: 10.6.465758; Current as of: Sep 23, 2013

## 2015-07-19 NOTE — Pre-Op/Pre-Procedure Screening (Addendum)
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       Type 2 diabetes mellitus without complication 046/56/8127   Cough 12/14/2013    Renal cyst ( 6.4 cm cyst left kidney) 08/24/2013    Hyperlipidemia with target LDL less than 70 07/28/2013    Vitamin D insufficiency 07/28/2013    Abnormal LFTs 07/28/2013    DM2 (diabetes mellitus, type 2) 07/11/2013     Overview Note:     10/15 Diabetes- Dining with Diabetes: Basics  03/10/2014 Diabetes - Recharge Workshop      Depression with anxiety 07/11/2013    Stress at home 07/11/2013    Leg cramps-right calf 07/11/2013    Right ear pain 07/11/2013    Fibromyalgia 07/11/2013    HTN (hypertension) 07/11/2013    GERD (gastroesophageal reflux disease) 07/11/2013       PMH:  Past Medical History   Diagnosis Date    Asthma     Diabetes mellitus     Hypertension        PSH:  Past Surgical History   Procedure Laterality Date    Pr ligate fallopian tube       Tubal ligation    Pr total abdom hysterectomy  1980s     partial; no BSO    Pr knee scope,diagnostic  1980s     Knee arthroscopy    Pr repair tympanic membrane       Tympanoplasty    Colonoscopy  01/04/14     polyp--TA and erosion; hemorrhoids; tics; repeat x 3 yrs    Reduction breast  1985    Phacoemulsification Right 02/12/2015     with IOL       Anesthetic Hx: Denies anesthesia complications.  Denies family history of anesthesia complications. Denies history of malignant hyperthermia.  02/12/2015- MAC, no cx recorded  12/18/2014- MAC, no cx recorded     Allergies:   Allergies   Allergen Reactions    Contrast Dye [Radiopaque Agent] Hives    Hydrochlorothiazide Other-Reaction in Comments     Patient reports    Januvia [Sitagliptin] Other-Reaction in Comments     Patient reports    Lyrica [Pregabalin] Other-Reaction in Comments     Patient reports    Omnipaque [Iohexol] Other-Reaction in Comments     Patient reports    Prozac [Fluoxetine Hcl] Other-Reaction in Comments     Patient reports    Sulfa (Sulfonamide Antibiotics) Rash    Topiramate Other-Reaction in Comments     Patient reports       Medications: .  [facility]   Medication Sig Dispense Refill    Amlodipine (NORVASC) 10 mg Tablet TAKE 1 TABLET BY MOUTH EVERY DAY. 90 tablet 1    Aspirin 81 mg DR Tablet EC Tablet Take 1 tablet by mouth  every day. 30 tablet 5    Atorvastatin (LIPITOR) 10 mg Tablet TAKE 1 TABLET BY MOUTH EVERY DAY AT BEDTIME. 90 tablet 1    CALCIUM-MAGNESIUM-ZINC PO       canagliflozin (INVOKANA) 100 mg Tablet Take 1 tablet by mouth every day. 90 tablet 3    Cholecalciferol, Vitamin D3, (VITAMIN D-3) 2,000 unit Tablet Take 1 tablet by mouth every day.      FLUTICASONE/SALMETEROL (ADVAIR DISKUS INHA) Take  by inhalation.      GlipiZIDE (GLUCOTROL XL) 5 mg SR Tablet Take 1 tablet by mouth every morning with a meal. 90 tablet 3    Losartan (COZAAR) 25 mg Tablet TAKE 1 TABLET BY MOUTH EVERY DAY. 90 tablet 1    Metformin (GLUCOPHAGE) 1,000 mg tablet Take 1 tablet by mouth 2 times daily with meals. (diabetes) 180 tablet 3    Omeprazole (PRILOSEC) 20 mg Delayed Release Capsule TAKE ONE CAPSULE BY MOUTH EVERY DAY 90 capsule 1    Oxybutynin (DITROPAN) 5 mg Tablet Take 5 mg by mouth 3 times daily.      Venlafaxine ([name]) 37.5 mg Tablet Take 1  tablet by mouth every day. Take in the morning on waking. 30 tablet 2                    Drug/Alcohol Hx:  History   Smoking Status    Former Smoker    Packs/day: 0.10    Years: 20.00   Smokeless Tobacco    Never Used     Comment: quit 30 years ago     History   Alcohol Use    0.0 oz/week    0 Standard drinks or equivalent per week     Comment: rare shares a beer or glass wine every 2-3 months      History   Drug Use No       CV/pulm Tests:    PFT 03/20/2014      Impression     Mixed restriction and airflow obstruction. Significant increase in FEV1  post-bronchodilator. A reduced ERV may be due to body habitus, neuromuscular  weakness, thoracic cage abnormalities, or pleural disease. Normal DLCO.    Results for FLETCHER, RATHBUN (MRN [id number]) as of 12/07/2014 15:22   Ref. Range 03/20/2014 13:06   FEV1 (PRE) Latest Ref Range: 0 - 12 Liters 1.73   FEV1 (POST) Latest Ref Range: 0 - 12 Liters 1.94   FVC (PRE) Latest Ref Range: 0 - 12 Liters 2.28   FVC (POST) Latest Ref Range: 0 - 12 Liters 2.39   FEV1/FVC (PRE) Latest Ref Range: 0 - 12 % 76   FEV1/FVC (POST) Latest Ref Range: 0 - 12 % 81   TLC (PRE) Latest Ref Range: 0.05-11.99 Liters 3.73   DLCO (POST) Latest Ref Range: 0.05-99.99 mL/mmHg/min      Sleep Study 02/17/2014          Labs:   CBC:     Lab Results  Component Value Date/Time   WHITE BLOOD CELL COUNT 8.7 12/06/2014 1145   RED CELL COUNT 5.05 12/06/2014 1145   HEMOGLOBIN 13.6 12/06/2014 1145   HEMATOCRIT 42.0 12/06/2014 1145   MCV 83.1 12/06/2014 1145   MCH 26.9 (L) 12/06/2014 1145   RDW 14.8 (H) 12/06/2014 1145   PLATELET COUNT 234 12/06/2014 1145       BMP/CMP:    Lab Results  Component Value Date/Time   SODIUM 141 08/22/2014 0815   POTASSIUM 4.2 08/22/2014 0815  CHLORIDE 103 08/22/2014 0815   CARBON DIOXIDE TOTAL 25 08/22/2014 0815   CREATININE BLOOD 0.82 08/22/2014 0815   E-GFR, AFRICAN AMERICAN >60 08/22/2014 0815   E-GFR, NON-AFRICAN AMERICAN >60 08/22/2014 0815   UREA NITROGEN, BLOOD (BUN) 17  08/22/2014 0815   GLUCOSE 109 (H) 08/22/2014 0815   CALCIUM 9.5 08/22/2014 0815       Lab Results  Component Value Date/Time   PROTEIN 7.7 08/22/2014 0815   ALBUMIN 3.9 08/22/2014 0815   ALKALINE PHOSPHATASE (ALP) 97 08/22/2014 0815   ASPARTATE TRANSAMINASE (AST) 40 08/22/2014 0815   BILIRUBIN TOTAL 0.8 08/22/2014 0815   ALANINE TRANSFERASE (ALT) 38 08/22/2014 0815       HgBA1C:   Lab Results   Lab Name Value Date/Time    HGBA1C 7.7 (H) 03/23/2015 09:04 AM    HGBA1C 6.9 (H) 12/06/2014 11:45 AM    HGBA1C 6.7 (H) 08/22/2014 08:15 AM       Coag Panel:    Lab Results  Component Value Date/Time   INR 1.02 12/08/2013 1030       Thyroid Panel:    Lab Results  Component Value Date/Time   THYROID STIMULATING HORMONE 1.58 12/08/2013 1030       Type and Screen:   No results found for: ABO    ROS:  CV: HTN well controlled,HLD.Denies MI. Denies any chest pain/pressure, palpitations, DOE, PND, orthopnea, dizziness or edema.  Pt. is able to lie flat without difficulty breathing.   Resp: Former smoker,Seasonal Allergies,Mild Asthma - stable on routine/prn MDI, URI  - productive cough and runny nose x 4 days. Pt will see her PCP today, 07/19/2015 .Denies  sleep apnea or history of pneumonia  Neuro/Psych: Depression, Bilateral hearing loss .Denies CVA, TIA, seizures, neurologic disease  Musculoskeletal: Fibromyalgia, chronic  Neck and LBP  Med: GERD-controlled,Renal Cyst,fatty Liver,DM 2 well controlled AFBS 120-166,Vitamin D deficiency. . Denies   blood dyscrasia/ coagulopathy. Denies recent antibiotic use, hospitalization or ED visit      Activity: light housework    PE: .(per patient report)  Airway:  Denies jaw/TMJ problems. Denies loose, missing or broken teeth.  Denies anything in her mouth that is easily removable.    Neck:  Denies pain in the neck/radicular symptoms with extension and flexion.  Per Anesthesia note on 02/12/2015  Airway   Mallampati: IV  TM distance: >3 FB   Neck ROM is full. Dental    (+) missing   Pulmonary  - negative for pulmonary conditions with ROS and normal exam Cardiovascular - cardiovascular exam normal  (+) hypertension (), hypertriglyceridemia,     Patient has good exercise tolerance.   Neuro/Psych    Patient has psychiatric history. GI/Hepatic/Renal   (+) GERD (well controlled),    Endo/Other    (+) diabetes mellitus type 2,     Comments: +fibromyalgia    Smoker- denies  ETOH- occasional,           Vitals:  Temp: 36.4 C (97.5 F) (06/14/2015  3:26 PM)  Temp src: Tympanic (06/14/2015  3:26 PM)  Pulse: 72 (06/14/2015  3:26 PM)  BP: 128/52 (06/14/2015  3:26 PM)  Resp: 16 (05/01/2015  8:35 AM)  SpO2: 97 % (room air) (05/01/2015  8:35 AM)  Height: 1.575 m (5' 2" ) (06/14/2015  3:26 PM)  Weight: 83.5 kg (184 lb) (06/14/2015  3:26 PM)      Patient's last menstrual period was 05/06/1983.      Pt. Instructions: .NPO instructions  per eye clinic, Medications to  take the morning of surgery:Amlodipine,Omeprazole .  Usual morning eye drops per ophthalmology clinic instructions(.Pt verbalized understanding of medication instructions with  verbal read back  for confirmation),anesthesia      Instructions for Day of Surgery:  -Please stop NSAID (motrin, ibuprofen, aleve, naproxen, etc) & herbal/nutritional supplement (esp. Fish oil, vitamin E , C, gingko, garlic tablets) 1 week prior to surgery to minimize the risk of bleeding or anesthetic interaction.       Please discuss with your surgeon and primary care physician/Anticoagulation clinic if you take blood thinners.  You will receive a phone call from a pre-op nurse 1-2 working days  before your surgery date to confirm your check-in and location, arrival time, and surgical time. They will also review your fasting guidelines with you.     Mikella Linsley Charmaine Downs, RN

## 2015-07-19 NOTE — Progress Notes (Signed)
Paula Daugherty is a 70yrold female   Chief Complaint   Patient presents with    Cough    Cold     Patient Active Problem List   Diagnosis    DM2 (diabetes mellitus, type 2)    Depression with anxiety    Stress at home    Leg cramps-right calf    Right ear pain    Fibromyalgia    HTN (hypertension)    GERD (gastroesophageal reflux disease)    Hyperlipidemia with target LDL less than 70    Vitamin D insufficiency    Abnormal LFTs    Renal cyst ( 6.4 cm cyst left kidney)    Cough    Type 2 diabetes mellitus without complication    Fatty liver    Macroalbuminuric diabetic nephropathy    History of actinic keratoses    Cataract     Current Outpatient Prescriptions   Medication Sig Dispense Refill    Amlodipine (NORVASC) 10 mg Tablet TAKE 1 TABLET BY MOUTH EVERY DAY. 90 tablet 1    Aspirin 81 mg DR Tablet EC Tablet Take 1 tablet by mouth every day. 30 tablet 5    Atorvastatin (LIPITOR) 10 mg Tablet TAKE 1 TABLET BY MOUTH EVERY DAY AT BEDTIME. 90 tablet 1    Blood Sugar Diagnostic (ONETOUCH ULTRA TEST) Strips Use for testing blood glucose 3 times per day 300 strip 3    CALCIUM-MAGNESIUM-ZINC PO       canagliflozin (INVOKANA) 100 mg Tablet Take 1 tablet by mouth every day. 90 tablet 3    Cholecalciferol, Vitamin D3, (VITAMIN D-3) 2,000 unit Tablet Take 1 tablet by mouth every day.      FLUTICASONE/SALMETEROL (ADVAIR DISKUS INHA) Take  by inhalation.      GlipiZIDE (GLUCOTROL XL) 5 mg SR Tablet Take 1 tablet by mouth every morning with a meal. 90 tablet 3    Losartan (COZAAR) 25 mg Tablet TAKE 1 TABLET BY MOUTH EVERY DAY. 90 tablet 1    Metformin (GLUCOPHAGE) 1,000 mg tablet Take 1 tablet by mouth 2 times daily with meals. (diabetes) 180 tablet 3    Omeprazole (PRILOSEC) 20 mg Delayed Release Capsule TAKE ONE CAPSULE BY MOUTH EVERY DAY 90 capsule 1    Oxybutynin (DITROPAN) 5 mg Tablet Take 5 mg by mouth 3 times daily.      Venlafaxine ([name]) 37.5 mg Tablet Take 1 tablet by mouth every  day. Take in the morning on waking. 30 tablet 2     No current facility-administered medications for this visit.      Family History   Problem Relation Age of Onset    Diabetes Father     Heart Mother      MI at 645s    Non-contributory Brother     Non-contributory Brother      Past Surgical History   Procedure Laterality Date    Pr ligate fallopian tube       Tubal ligation    Pr total abdom hysterectomy  1980s     partial; no BSO    Pr knee scope,diagnostic  1980s     Knee arthroscopy    Pr repair tympanic membrane       Tympanoplasty    Colonoscopy  01/04/14     polyp--TA and erosion; hemorrhoids; tics; repeat x 3 yrs    Reduction breast  1985    Phacoemulsification Right 02/12/2015     with IOL  SUBJECTIVE: pt of Dr [name] who is here with URI symptoms. She has surgery schedule for her eyes on 45/30/17 and she wants to be well by then.    She started with symptoms 5 d ago and then moved to productive chest cough 2 d later.  She has not had runny nose or sneezing so she did not think it was allergies.  The runy nose and senezing started back last night. She has been feeling sob.  At night she ends up going to the living room because the cough is so bad and she does not want to keep her husband awake.  She has felt warm but not measured a temp.   She has had chills as well. She has not left the house in 4 d and doing woup and tea.     She has been on a cruise to [place] and she may have had more exposure.     ROS- no chest pain, no shortness of breath, no fever, no urinary or bowel problems. Other than those listed above.      OBJECTIVE:  Temp: 36.3 C (97.4 F) (03/16 1404)  Temp src: Temporal (03/16 1404)  Pulse: 85 (03/16 1404)  BP: 132/75 (03/16 1404)  Resp: 16 (03/16 1404)  SpO2: 95 % (03/16 1405)  Height: --  Weight: 80.5 kg (177 lb 6.4 oz) (03/16 1404)      GEN-alert, oriented, well nourised and comfortable with normal affect some nasal congestion  SKIN-no rashes or suspicous lesions visible  on exposed skin   EYES-PERRLA, EOMI, clear sclera   EARS-normal canals, clear TM's no discharge  Nose- normal mucosa, normal mucus  MOUTH/THROAT- mild  errythema no  exudate, no lesions  NECK/THYROID- no adenopathy, no thyromagaly  HEART- normal s1 s2, normal rate, no murmurs or extra sounds  LUNGS-good air movement, no rales, no wheezing, no rhonchi    ASSESSMENT/PLAN:    ICD-10-CM    1. Viral URI with cough J06.9 Promethazine-DM (PROMETHAZINE-DM) 6.25-15 mg/5 mL syrup    B97.89     no sign of bacterial infection , recommend flonase and inhaler and add cough syrup for at night          I did review patient's past medical and family/social history and it has been updated of any changes.  No barriers to learning. Patient verbalizes understanding of teaching and instructions. Patient reminded to always contact me should any concerns develop.  This report electronically signed by   Novella Rob, M.D.  Board Certified in [place].

## 2015-07-19 NOTE — Nursing Note (Signed)
Vitals taken, allergies verified, screen for pain and pharmacy verified.     Jazzlene Huot MA

## 2015-07-24 ENCOUNTER — Other Ambulatory Visit: Payer: Self-pay | Admitting: Orthopaedic Surgery

## 2015-07-24 DIAGNOSIS — M25571 Pain in right ankle and joints of right foot: Secondary | ICD-10-CM

## 2015-07-25 ENCOUNTER — Ambulatory Visit
Admission: RE | Admit: 2015-07-25 | Discharge: 2015-07-25 | Disposition: A | Discharge status: Home / Self Care | Payer: Medicare Other | Source: Ambulatory Visit | Attending: Orthopaedic Surgery | Admitting: Orthopaedic Surgery

## 2015-07-25 ENCOUNTER — Other Ambulatory Visit: Payer: Self-pay

## 2015-07-25 ENCOUNTER — Ambulatory Visit (HOSPITAL_BASED_OUTPATIENT_CLINIC_OR_DEPARTMENT_OTHER): Payer: Medicare Other | Admitting: Orthopaedic Surgery

## 2015-07-25 DIAGNOSIS — S82891D Other fracture of right lower leg, subsequent encounter for closed fracture with routine healing: Secondary | ICD-10-CM

## 2015-07-25 DIAGNOSIS — S82831D Other fracture of upper and lower end of right fibula, subsequent encounter for closed fracture with routine healing: Secondary | ICD-10-CM

## 2015-07-25 DIAGNOSIS — M25571 Pain in right ankle and joints of right foot: Secondary | ICD-10-CM

## 2015-07-25 NOTE — Progress Notes (Addendum)
Health Management and Education      Received referral in summer 2016 for  "Assistance with depression management, [id number], home related stress". Followed up with the patient who engaged in Care Coordination and disclosed stressors related to her elderly mother living in the mid [place] needing assistance and care giving. Resources were provided to the patient for care giving, strategies for finding a therapist were discussed and information was mailed to pts hourse. Patient has stated she has been going back and fourth to her mothers and could not establish with a therapist. Encouraged online support for caregiving through [place]. Patient has followed up regularly with [facility] providers for depression/med management. Called patient to follow up and left phone message and mychart message for patient. Will consider discussing returning to regular follow up care as patient has been provided with multiple resources by phone.         Electronically signed by   Keturah Barre, [place] Work, Care Manager   Health Management and Education  [phone]

## 2015-07-25 NOTE — Progress Notes (Signed)
Paula Daugherty FOLLOW-UP PROGRESS NOTE    Name: Paula Daugherty  MRN: [id number]  AGE: 53yr DATE: 07/25/2015      S: 89yrld female here for follow-up of right ankle distal fibula fracture x 12 weeks. DOI: 04/27/15.  Feels that her symptoms improved.  Gets occasional discomfort with standing for long periods.  Also has a burning sensation of her lateral ankle at times.  Has been wearing the ankle support brace for ambulation.  Taking Tylenol.  Has been sick recently and therefore has mostly been resting in the last week.  Has been doing ankle exercises on her own. No numbness or tingling. No swelling. No popping, no giving way.      O:LMP 05/06/1983  NAD, A&Ox3  EOMI  Easy nonlabored breathing  RRR  MSK:  Foot/ankle exam:   +TTP peroneal tendons and distal fibula   No instability.   No skin lesions noted on lower extremity  Capillary refill intact all toes  Ecchymosis- negative   Effusion none    Range of motion: normal  no pain with range of motion or resisted eversion  Muscle Strength: Tib Ant, EHL, FHL, Gastroc/soleus, Peroneals, Posterior tib= intact   Sensation intact to light touch L3-S1    IMAGING: Xrays that were taken and reviewed by myself show interval healing of  distal fibula fracture,     A/P: [age] old female with right ankle distal fibular fracture x 12 weeks    Plan:  - Advance activities as tolerated. Wean out of ankle support brace, but use for comfort as needed.  Continue ankle exercises. She'll followup in 8 weeks at which time x-rays should be taken of her right ankle. If still having ankle pain, may need to consider MRI to evaluate for peroneal tendinopathy.    No orders of the defined types were placed in this encounter.      Electronically signed by:  SuLuvenia HellerMD  Orthopaedic Surgery

## 2015-07-27 ENCOUNTER — Other Ambulatory Visit: Payer: Self-pay

## 2015-07-27 NOTE — Progress Notes (Signed)
Health Management and Education       Reviewed the pertinent portion of patients chart. Called and left voice mail message for Patient, requesting return call to schedule phone appointment. Awaiting Call back.       Electronically signed by   Ethie Curless, LCSW  Social Work, Care Manager   Health Management and Education  [phone]

## 2015-07-30 ENCOUNTER — Ambulatory Visit: Payer: Medicare Other | Admitting: Neurology

## 2015-08-05 NOTE — Anesthesia Preprocedure Evaluation (Addendum)
Anesthesia Evaluation    History of Present Illness  70 year old female for bil blepharoplasty.    POC glucose today in pre-op: 126 mg/dL    Airway   Mallampati: II  TM distance: >3 FB     Neck ROM is full.     Dental - no notable dental history.    Pulmonary     Pulmonary ROS comment: Occasional seasonal allergies  Patient's breath sounds clear to auscultation. Cardiovascular   (+) hypertension (well controlled),  hypertriglyceridemia,     Rhythm: regular  Rate: normal  Patient has good exercise tolerance.   Neuro/Psych      Neuro/Psych ROS comment: Depression and anxiety    Fibromaylgia- lower back mostly   GI/Hepatic/Renal   (+) GERD (),        Endo/Other    (+) diabetes mellitus well controlled type 2, ,           I have reviewed the patient's [facility] unit notes and Nursing notes.                 Anesthesia Plan    ASA 3     MAC   (ANESTHESIA   ATTENDING   NOTE  08/06/2015 07:18    This patient was seen, evaluated, and care plan was developed with   Jilda Panda, CRNA  on 08/06/2015 at  07:12am.     I have reviewed and examined the patient prior to induction of anesthesia and confirmed the issues in the pre-op screening note. There have been no interval changes.  I agree with the assessment and plan as outlined in the CRNA's note.    Report Electronically signed by:   Sharia Reeve, MD   Attending Anesthesiologist, Anesthesiology and Pain Medicine,  [phone],   Pager: [phone]   )  intravenous induction   Anesthetic plan and risks discussed with patient and spouse.  Resident/Fellow/CRNA discussed the plan with the attending.  I personally performed a physical assessment on this patient.      Sharyn L. Babbitt CRNA  Sr. Nurse Anesthetist  P.I [id number]  BZJIR:[id number]

## 2015-08-06 ENCOUNTER — Ambulatory Visit
Admission: RE | Admit: 2015-08-06 | Discharge: 2015-08-06 | DRG: 123 | Disposition: A | Discharge status: Home / Self Care | Payer: Medicare Other | Source: Ambulatory Visit | Attending: Ophthalmology | Admitting: Ophthalmology

## 2015-08-06 ENCOUNTER — Ambulatory Visit: Payer: Medicare Other | Admitting: Anesthesiology

## 2015-08-06 ENCOUNTER — Ambulatory Visit (HOSPITAL_BASED_OUTPATIENT_CLINIC_OR_DEPARTMENT_OTHER): Payer: Medicare Other | Admitting: Anesthesiology

## 2015-08-06 ENCOUNTER — Encounter (HOSPITAL_BASED_OUTPATIENT_CLINIC_OR_DEPARTMENT_OTHER)
Admission: RE | Disposition: A | Discharge status: Home / Self Care | Payer: Medicare Other | Source: Ambulatory Visit | Attending: Ophthalmology

## 2015-08-06 DIAGNOSIS — E1136 Type 2 diabetes mellitus with diabetic cataract: Secondary | ICD-10-CM | POA: Insufficient documentation

## 2015-08-06 DIAGNOSIS — M797 Fibromyalgia: Secondary | ICD-10-CM | POA: Insufficient documentation

## 2015-08-06 DIAGNOSIS — N281 Cyst of kidney, acquired: Secondary | ICD-10-CM | POA: Insufficient documentation

## 2015-08-06 DIAGNOSIS — E1121 Type 2 diabetes mellitus with diabetic nephropathy: Secondary | ICD-10-CM | POA: Insufficient documentation

## 2015-08-06 DIAGNOSIS — K219 Gastro-esophageal reflux disease without esophagitis: Secondary | ICD-10-CM | POA: Insufficient documentation

## 2015-08-06 DIAGNOSIS — E785 Hyperlipidemia, unspecified: Secondary | ICD-10-CM | POA: Insufficient documentation

## 2015-08-06 DIAGNOSIS — H02831 Dermatochalasis of right upper eyelid: Secondary | ICD-10-CM

## 2015-08-06 DIAGNOSIS — H02832 Dermatochalasis of right lower eyelid: Secondary | ICD-10-CM

## 2015-08-06 DIAGNOSIS — H02403 Unspecified ptosis of bilateral eyelids: Secondary | ICD-10-CM

## 2015-08-06 DIAGNOSIS — E559 Vitamin D deficiency, unspecified: Secondary | ICD-10-CM | POA: Insufficient documentation

## 2015-08-06 DIAGNOSIS — I10 Essential (primary) hypertension: Secondary | ICD-10-CM | POA: Insufficient documentation

## 2015-08-06 DIAGNOSIS — F418 Other specified anxiety disorders: Secondary | ICD-10-CM | POA: Insufficient documentation

## 2015-08-06 DIAGNOSIS — H02835 Dermatochalasis of left lower eyelid: Secondary | ICD-10-CM

## 2015-08-06 DIAGNOSIS — K760 Fatty (change of) liver, not elsewhere classified: Secondary | ICD-10-CM | POA: Insufficient documentation

## 2015-08-06 DIAGNOSIS — H02834 Dermatochalasis of left upper eyelid: Secondary | ICD-10-CM

## 2015-08-06 DIAGNOSIS — H02409 Unspecified ptosis of unspecified eyelid: Secondary | ICD-10-CM

## 2015-08-06 HISTORY — PX: REPAIR, BLEPHAROPTOSIS: SHX001598

## 2015-08-06 SURGERY — BLEPHAROPLASTY
Anesthesia: Monitored Anesthesia Care | Site: Eye | Laterality: Bilateral | Wound class: Clean

## 2015-08-06 MED ORDER — FENTANYL (PF) 50 MCG/ML INJECTION SOLUTION
INTRAMUSCULAR | Status: DC | PRN
Start: 2015-08-06 — End: 2015-08-06
  Administered 2015-08-06 (×4): 25 ug via INTRAVENOUS

## 2015-08-06 MED ORDER — FENTANYL (PF) 50 MCG/ML INJECTION SOLUTION
25.0000 ug | INTRAMUSCULAR | Status: DC | PRN
Start: 2015-08-06 — End: 2015-08-06

## 2015-08-06 MED ORDER — ERYTHROMYCIN 5 MG/GRAM (0.5 %) EYE OINTMENT
0.5000 [in_us] | TOPICAL_OINTMENT | Freq: Three times a day (TID) | OPHTHALMIC | 3 refills | Status: DC
Start: 2015-08-06 — End: 2015-10-30

## 2015-08-06 MED ORDER — LIDOCAINE 1 %-EPINEPHRINE 1:100,000 INJECTION SOLUTION
INTRAMUSCULAR | Status: DC | PRN
Start: 2015-08-06 — End: 2015-08-06
  Administered 2015-08-06: 6 mL

## 2015-08-06 MED ORDER — LACTATED RINGERS IV INFUSION
INTRAVENOUS | Status: DC
Start: 2015-08-06 — End: 2015-08-06
  Administered 2015-08-06: 09:00:00 via INTRAVENOUS

## 2015-08-06 MED ORDER — ACETAMINOPHEN 500 MG TABLET
1000.0000 mg | ORAL_TABLET | Freq: Four times a day (QID) | ORAL | Status: DC | PRN
Start: 2015-08-06 — End: 2015-08-06
  Administered 2015-08-06: 1000 mg via ORAL
  Filled 2015-08-06: qty 2

## 2015-08-06 MED ORDER — LACTATED RINGERS IV INFUSION
INTRAVENOUS | Status: DC
Start: 2015-08-06 — End: 2015-08-06
  Administered 2015-08-06: 07:00:00 via INTRAVENOUS

## 2015-08-06 MED ORDER — THROMBIN (HUMAN PLASMA)-FIBRINOGEN-APROTININ-CALCIUM 2 ML TOPICAL KIT
PACK | TOPICAL | Status: DC | PRN
Start: 2015-08-06 — End: 2015-08-06
  Administered 2015-08-06: 2 mL via TOPICAL

## 2015-08-06 MED ORDER — HYDROMORPHONE 1 MG/ML INJECTION SYRINGE
0.2000 mg | INJECTION | INTRAMUSCULAR | Status: DC | PRN
Start: 2015-08-06 — End: 2015-08-06

## 2015-08-06 MED ORDER — LIDOCAINE HCL 10 MG/ML (1 %) INJECTION SOLUTION
0.1000 mL | INTRAMUSCULAR | Status: DC | PRN
Start: 2015-08-06 — End: 2015-08-06
  Administered 2015-08-06: 0.2 mL
  Filled 2015-08-06: qty 2

## 2015-08-06 MED ORDER — ERYTHROMYCIN 5 MG/GRAM (0.5 %) EYE OINTMENT
TOPICAL_OINTMENT | OPHTHALMIC | Status: DC | PRN
Start: 2015-08-06 — End: 2015-08-06
  Administered 2015-08-06: 0.5 [in_us] via OPHTHALMIC

## 2015-08-06 MED ORDER — MIDAZOLAM (PF) 1 MG/ML INJECTION SOLUTION
INTRAMUSCULAR | Status: DC | PRN
Start: 2015-08-06 — End: 2015-08-06
  Administered 2015-08-06: 0.5 mg via INTRAVENOUS
  Administered 2015-08-06 (×2): 1 mg via INTRAVENOUS
  Administered 2015-08-06: 0.5 mg via INTRAVENOUS
  Administered 2015-08-06: 1 mg via INTRAVENOUS

## 2015-08-06 MED ORDER — INTRAOP NACL 0.9% 500 ML IRRIGATION BOTTLE
Status: DC | PRN
Start: 2015-08-06 — End: 2015-08-06
  Administered 2015-08-06: 500 mL

## 2015-08-06 MED ORDER — REMIFENTANIL (PF) 100 MCG/2 ML (50 MCG/ML) IN 0.9 % NACL IV SYRINGE
INJECTION | INTRAVENOUS | Status: DC | PRN
Start: 2015-08-06 — End: 2015-08-06
  Administered 2015-08-06: 100 ug via INTRAVENOUS

## 2015-08-06 MED ORDER — ONDANSETRON HCL (PF) 4 MG/2 ML INJECTION SOLUTION
4.0000 mg | INTRAMUSCULAR | Status: DC | PRN
Start: 2015-08-06 — End: 2015-08-06

## 2015-08-06 SURGICAL SUPPLY — 25 items
APPLICATOR Q TIPS 3IN STERILE (Other) ×4 IMPLANT
BLADE KNIFE 15 (Blade) ×2 IMPLANT
CAUTERY BIPOLAR CORD 12FT (Cautery) ×2 IMPLANT
CAUTERY BIPOLAR FORCEPS 0.5MM X 4IN JEWELER TIP GREEN DISPOSABLE (Other) ×2 IMPLANT
CAUTERY BOVIE PAD ADULT (Other) ×2 IMPLANT
CAUTERY ELECTRODE MICROSURGICAL NEEDLE POINT (Other) ×2 IMPLANT
DISC OBS 101805 - COVER LIGHT HANDLE FLEXIBLE GREEN (Other) ×4 IMPLANT
DISC OBS 160191 - PLAIN GUT FAST ABSORB 6-0 PC-1 18IN 1916G (Suture) ×4 IMPLANT
DISC USE 100821 - SYRINGE 10ML LUER LOCK (Syringe) ×2 IMPLANT
DRAPE SHEET LARGE 60 X 76 (89121) (Drape) ×2 IMPLANT
DRAPE SHEET U 76 X 120IN (Drape) ×2 IMPLANT
DRESSING CORNEAL SHIELD 20MM SMITH PROTECTION GREEN (Other) IMPLANT
DRESSING CORNEAL SHIELDS ADULT (Other) IMPLANT
GLOVES BIOGEL INDICATOR 6 GREEN UNDERGLOVE LATEX (Glove) ×8 IMPLANT
GLOVES BIOGEL SENSOR 6 TOP GLOVE LATEX BURGUNDY PACKAGE (Glove) ×2 IMPLANT
HEADREST DONUT 9IN (M10-090) (Other) ×2 IMPLANT
INSTRUMENT VISIWIPE 7.3 X 7.3CM STERILE (Other) ×2 IMPLANT
MARKER WITH ULTRA FINE TWIN TIPS ON BOTH ENDS (Other) ×2 IMPLANT
NEEDLE 27 GAUGE X 1/2IN (Needle) ×4 IMPLANT
NEEDLE 30 GAUGE X 1/2IN (Needle) ×2 IMPLANT
PACK BASIN LATEX SAFE (DYNJ0191310Q) (Pack) ×2 IMPLANT
PAD EGGCRATE HEEL & ANKLE LF (4815) (Other) ×2 IMPLANT
SUTURE SILK 4-0 RB-1 30IN BLACK (Suture) ×6 IMPLANT
SYRINGE LL 12ML (1181200777) (Syringe) ×2 IMPLANT
TRAY SKIN SCRUB PVP W/SPONGE (41591) (Prep) ×2 IMPLANT

## 2015-08-06 NOTE — Discharge Instructions (Signed)
POST-OPERATIVE (AFTER SURGERY) [place], [address], [facility]   Ophthalmic Plastic and Orbital Surgery   [id number] [phone]     What to do AFTER your surgery:   Gently place ice compresses (A bag of frozen peas with a damp washcloth works best.) over your eyelid(s) for 10- 20 minutes every hour, until bedtime, for the first 48 hours (two days) after your surgery. Then use the ice compresses for 4 times a day. Never place ice or frozen bags directly on the skin.     Starting TWO days after surgery you may gently cleanse around the upper eyelid with cotton balls or Q tips moistened with lukewarm water and baby shampoo. Use a blot and pat motion (rather than a wiping motion)  Repeat this as necessary during the day. Do NOT rub your eyes     You had your incision on the inside and outside of the eyelids. If the eyes feel irritated,you may use preservative free artificial tears but try not to manipulate the upper lid.     If you see a gel/jelly type substance coming from the eye please do not pull this out. It is the tissue glue and it will dissolve on its own.     You can apply the antibiotic ointment to your skin incisions two to three times during the day for 1 week.     Take two extra-strength Tylenol tablets every four hours for pain until bedtime. Do not exceed eight tablets in a 24 hour period. If this does not relieve the pain, take one or two Vicodin/Norco/Tylenol #3 tablets every four to six hours as needed.     Sleep on your back with your head slightly elevated (on 2 pillows) for one week.     If desired, you may wash your hair the morning after your surgery, although try to avoid direct water contact (i.e. stand with shower head to your back) for one week.     Please do not be alarmed by your appearance.   Redness, bruising, and/or swelling of the upper and lower eyelids, face, and cheeks occurs to a lesser or greater extent in all patients. Expect your  eyelids to be more swollen on the second postoperative day than on the first. Your eyelids will be more swollen in the mornings than later in the day.   Please note that some oozing or drainage is common following a surgical procedure.   Occasional bleeding into the white of the eye may occur; this may take four to six weeks to completely resolve.   Please be patient. Things will return to normal in several weeks.   Continue your regular preoperative eye drops and all other medications unless specifically instructed otherwise.   Unless specifically instructed, aspirin products such as Bayer, Bufferin, Anacin, Excedrin, etc. should be avoided for at least one week after surgery. This also includes Advil, Nuprin, Motrin, and Ibuprofen.   Herbal supplements such as fish oil, garlic, ginger, ginkgo biloba, ginseng, goldenseal, kava kava, melatonin, St. John's wort should be avoided 5 days after surgery.     Restrictions after your surgery:   Do not touch/manipulate/itch your upper eyelids with your finger or hands for 5 days.   Do not bend over or lift objects heavier than 10 lbs. for one week.   No strenuous activity for four weeks.   No swimming for four weeks.   Avoid sun exposure while black and  blue.   Avoid alcohol and smoking for 1 week after surgery.   Bloody tears are normal after this procedure.     If you experience bleeding, take some clean gauze and press directly on the wound for 20 minutes (without peeking, watch television to distract yourself.) If the wound is still bleeding, apply pressure for another 20 minutes. If this does not stop the bleeding, call the office and ask to speak to the triage nurse or eye doctor on call.   What to look for:   Your vision will be mildly blurry from the antibiotic ointment in your eyes. If you experience a significant change in your vision, severe pain, high fever, sudden red swelling or pus, call the office immediately.   If you have any problems, please call the  office at [phone] or go to the [facility] Emergency Room.    General Postoperative Instructions:    Immediate post-op period:  It is normal to feel dizzy and sleepy for several hours after your operation.  Therefore, you should not drive, operate any equipment, sign any important papers or make any significant decisions until the next day.    Diet: Start with clear liquids.  Progress to a normal diet as tolerated.  You have received pain medication that may make you sick to your stomach.  Soups and foods that are easy to digest are best tolerated as you begin to eat (avoid spicy and fatty food).  Drink plenty of fluids.  Do not drink alcoholic beverages for 24 hours.    Temperature:  Please report any temperature over 101 degrees to your physician.  Night/weekend hours:  Call the hospital operator @ [phone] and ask them to page the resident on call for Ophthalmology.

## 2015-08-06 NOTE — OR Nursing (Signed)
OR To Postop Destination    Patient's level of consciousness: awake and comfortable    Patient transferred to: pacu  Transported via: gurney  Transported by: Engineer, building services  Head of bed: elevated  Oxygen delivered via: N/A  Monitored enroute: yes  visually  Report given to: PACU RN, jo barrata

## 2015-08-06 NOTE — OR Nursing (Signed)
OR Arrive    Patient's level of consciousness: awake and comfortable    Summary report reviewed: yes    Patient transported to OR via: gurney  Transported by: CRNA  Head of bed: elevated  Oxygen delivered via: N/A  Monitored enroute: yes, visually  Assumed care.  Paula Daugherty

## 2015-08-06 NOTE — H&P (Signed)
H&P Update:  Paula Daugherty is a 26yrfemale who [facility]    She reports no change in her health since her last visit.    Patient seen and examined.   Lungs: regular work of breathing, regular rate of breathing, clear bilaterally to ascultation  Heart: regular rate and rhythm, normal s1 s2, no gallops rubs or murmurs    ASSESSMENT: ptosis and dermatochalasis, both eyes     PLAN: bilateral MMCR and blepharoplasty    MManson Allan MD  PGY-2   [facility]  Service Pager: [phone] Personal Pager: [phone]

## 2015-08-06 NOTE — Procedures (Signed)
ASSISTANT Manson Allan MD    PREOPERATIVE DIAGNOSIS: Ptosis, OU    Dermatochalasis OU     POSTOPERATIVE DIAGNOSIS: Same.       OPERATIVE PROCEDURE:      Ptosis correction, both upper eyelid.  CPT [phone]  (bundled with Functional blepharoplasty, OU)    ANESTHESIA: MAC.   COMPLICATIONS: None.  SPECIMENS: None.  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  She presents for surgical correction of this condition.  The risks, benefits and alternatives to the procedure were discussed with the patient prior to the procedure and informed consent was obtained prior to surgery.    In the preoperative holding area, the upper eyelid crease was demarcated using a surgical marking pen as well as the amount of skin to be excised without inducing lagophthalmos.    The patient was brought to the operating room and placed in the supine position.  IV sedation was given per the Anesthesia team.  A surgical pause, or "time out" was performed, and confirmed the correct patient, procedure and site.    The upper lids were injected with a mixture of 1% lidocaine and epinephrine 1:100,000 and quarter percent Marcaine.  A 4-0 silk-traction suture was placed through the lid margin of the right upper lid.  The upper lid was inverted over a muscle hook. Traction sutures were placed through the conjunctiva and Mueller muscle 4 mm superior to the superior border of the tarsus.  Anterior traction was applied to pull the Conjunctiva and Mullers muscle away from the underlying levator muscle.  A ptosis T clamp was applied to the tented tissue.  The excess clamped tissue was excised flush to the lower border of the T clamp.  Tisseel was used to close the conjunctiva.  .  The traction suture was removed from the lid margin.  The lid was returned to its regular position. The same  was done on the other side.      Attention was made for the blepharoplasty portion of the procedure.  A 15 Bard-Parker blade was used to excise the ellipse of skin that had been demarcated in the preoperative holding area. The skin was then removed using Westcott scissors.  A needle-tip Bovie cautery unit was used to achieve hemostasis.  The wound was then closed using 6-0 fast-absorbing gut suture in a running fashion.  The same procedure in an identical fashion was performed on the left upper lid.  The patient tolerated the procedure well.  Hemostasis was achieved at all times. The shields were removed.  At the end of the case, the wounds were dressed with erythromycin ointment.  The patient was brought to the recovery room in good condition.  There were no complications.     Operating Room Procedure Presence: I certify that I was present throughout the entire case.    The information contained on this form is true and accurate to the best of my knowledge.  Further, I understand that if I misrepresent, falsify or conceal information regarding my participation in the professional service described above, I may be subject to fine, imprisonment, or civil penalty under applicable federal laws.    Report Electronically Signed By:  LNyra Jabs Attending  Pager: [phone]

## 2015-08-06 NOTE — Nurse Discharge Note (Signed)
Discharge to home via wheelchair to private auto at [facility].   Belongings sent home with family.  See EMR for assessment.  Dione Petron

## 2015-08-06 NOTE — Anesthesia Postprocedure Evaluation (Signed)
Patient: Paula Daugherty    BLEPHAROPLASTY  REPAIR, BLEPHAROPTOSIS    Anesthesia Type: MAC    Vital Signs (Last Recorded):  BP: 149/82  Pulse: 71  Resp: 13  Temp Max: 36.5 C (97.7 F)  (Last 24 hours)  Temp: 36.5 C (97.7 F)  SpO2: 95 % on    O2 Device: room air      Anesthesia Post Evaluation    Procedure: Procedure(s):BLEPHAROPLASTYREPAIR, BLEPHAROPTOSIS  Location: [facility] OR  Anesthesia: Monitored Anesthesia Care    Patient location during evaluation: PACU  Patient participation: complete - patient participated  Level of consciousness: awake and alert  Pain score: 4 (had tylenol p.o. in PACU and patient satisfied.)  Pain management: satisfactory to patient  Airway patency: patent  Anesthetic complications: no  Cardiovascular status: stable  Respiratory status: room air, spontaneous ventilation, unassisted and nonlabored ventilation  Hydration status: euvolemic     Sharia Reeve, MD          ANESTHESIA   ATTENDING   NOTE  08/06/2015 09:22    Dispo:       PACU course uneventful.    Patient is without complaint and is stable for PACU discharge to home in the care of family/friend.      Report Electronically signed by:   Sharia Reeve, MD   Attending Anesthesiologist, Anesthesiology and Pain Medicine,  [phone],   Pager: [phone]

## 2015-08-06 NOTE — Plan of Care (Signed)
Problem: Perioperative Period (Adult)  Goal: Signs and Symptoms of Listed Potential Problems Will be Absent or Manageable (Perioperative Period)  Signs and symptoms of listed potential problems will be absent or manageable by discharge/transition of care (reference Perioperative Period (Adult) CPG).   Outcome: Ongoing (interventions implemented as appropriate)

## 2015-08-08 ENCOUNTER — Telehealth: Payer: Self-pay | Admitting: Ophthalmology

## 2015-08-08 NOTE — Telephone Encounter (Signed)
Called and advised patient.

## 2015-08-08 NOTE — Telephone Encounter (Signed)
Called pharmacy and will substitute bacitracin for erythromycin. Thanks!    Manson Allan, MD  PGY-2   [facility] Op[facility]  Service Pager: [place] Pager: [phone]

## 2015-08-08 NOTE — Telephone Encounter (Signed)
Patient and pharmacy called regarding erythromycin ointment prescribed on 4/3. Ointment has been discontinued by manufacture a month ago and pharmacy would like to know if we can prescribe an alternative? Please route back once new RX is sent so I can let the patient know.  Thanks!

## 2015-08-10 ENCOUNTER — Ambulatory Visit: Payer: Medicare Other

## 2015-08-10 ENCOUNTER — Ambulatory Visit: Payer: Medicare Other | Attending: Ophthalmology

## 2015-08-10 DIAGNOSIS — Z9889 Other specified postprocedural states: Secondary | ICD-10-CM | POA: Insufficient documentation

## 2015-08-10 DIAGNOSIS — Z48810 Encounter for surgical aftercare following surgery on the sense organs: Secondary | ICD-10-CM | POA: Insufficient documentation

## 2015-08-10 MED ORDER — NEOMYCIN 3.5 MG/G-POLYMYXIN B 10,000 UNIT/G-DEXAMETH 0.1 % EYE OINT
0.5000 [in_us] | TOPICAL_OINTMENT | Freq: Every day | OPHTHALMIC | 0 refills | Status: DC
Start: 2015-08-10 — End: 2015-10-30

## 2015-08-10 NOTE — Progress Notes (Signed)
Chief Complaint   Patient presents with    Post-op     Post op follow up ~ S/p Ptosis correction, both upper eyelid (08/06/2015) - (bundled with Functional blepharoplasty, OU). CC:      Doing well    External:  Edema and ecchymosis.  Incision sites are clean and dry without evidence of infection.  Sutures dissolving.    IMPRESSION:  Normal postoperative visit following upper bleph.    PLAN:    F/u 4-6 wks    Electronically signed by : Nyra Jabs, MD, Attending Ophthalmologist, [place]

## 2015-08-14 ENCOUNTER — Other Ambulatory Visit: Payer: Self-pay | Admitting: Family Medicine

## 2015-08-14 NOTE — Telephone Encounter (Signed)
Last office visit: 06-06-15 with PCP  Please advise.  Kirby Crigler, [place]

## 2015-08-24 ENCOUNTER — Other Ambulatory Visit: Payer: Self-pay

## 2015-08-24 ENCOUNTER — Encounter: Payer: Self-pay | Admitting: Neurology

## 2015-08-24 DIAGNOSIS — G4719 Other hypersomnia: Secondary | ICD-10-CM

## 2015-08-24 DIAGNOSIS — F32A Depression, unspecified: Secondary | ICD-10-CM

## 2015-08-24 DIAGNOSIS — F329 Major depressive disorder, single episode, unspecified: Principal | ICD-10-CM

## 2015-08-24 NOTE — Telephone Encounter (Signed)
From: [name]  To: [name], MD  Sent: 08/24/2015 10:11 AM PDT  Subject: Non-urgent Medical Advice Question    Hello Dr, I wanted to touch base on the Efflexor. I am in my second 30 days. Still not seeing many changes. I am tired and sleeping to much. Having more down days. The day before Easter I cried all day with no apparent reason. I have been working on recouping from the ankle fracture and eye surgery so that may be impacting things. Wondering how you would like me to proceed at this point? Med increase, change or office visit? Something different? Look forward to hearing from you. Thank you, Paula Daugherty

## 2015-08-24 NOTE — Progress Notes (Signed)
Health Management and Education       Reviewed the pertinent portion of patients chart. Sent mychart for Patient to follow up, requesting return call or reply to schedule phone appointment next week. Awaiting Call back.       Electronically signed by   Keturah Barre, [place] Work, Care Manager   Health Management and Education  [phone]

## 2015-08-26 MED ORDER — VENLAFAXINE 37.5 MG TABLET
75.0000 mg | ORAL_TABLET | Freq: Every day | ORAL | 2 refills | Status: DC
Start: 2015-08-26 — End: 2015-09-07

## 2015-08-26 NOTE — Telephone Encounter (Signed)
Neuro / Sleep Chart Update 08/26/15:  Increased the patient's [name] from 37.5 mg daily to 75 mg daily for residual daytime sleepiness, long sleep times, and spontaneous crying spell. The patient will keep me informed of her response to the higher dose.    Paula Seashore, MD  Neuro / Sleep Med

## 2015-08-27 ENCOUNTER — Other Ambulatory Visit: Payer: Self-pay

## 2015-08-27 NOTE — Progress Notes (Signed)
Health Care Management and Education   Telephone Outreach:      Patient replied to the mychart that the effexor doesn't seem to be providing her with the relief she had hoped from her depression. Patient also reported travel plans, that she states she hopes will help her mood.  Per review of the chart pts medication was adjusted.     Sent mychart reply to the patient to clarify if she is interested in medication management of her depression or therapy (or both). Patient read this but did not reply.    Patient has been active in short term Care Coordination for 1 year. She was referred for "Psychosocial/resource issues. assistance with depression management, [id number], home related stress".  She has been provided with support via phone outreach, caregiver support groups (including online) and offered support in finding a therapist. At times, the patent has reported she felt searching for a therapist would be "more stressful than help".     Patient is in the pre-contemplative regarding therapy and further resources. Will follow up in 6 weeks and consider returning to regular follow up care.       Electronically signed by   Keturah Barre, [place] Management and Education  [phone]

## 2015-08-29 ENCOUNTER — Other Ambulatory Visit: Payer: Self-pay

## 2015-08-29 ENCOUNTER — Encounter: Payer: Self-pay | Admitting: Family Medicine

## 2015-08-29 DIAGNOSIS — E1121 Type 2 diabetes mellitus with diabetic nephropathy: Secondary | ICD-10-CM

## 2015-08-29 NOTE — Progress Notes (Signed)
Health Management and Education      Patient has been part of Care Coordination since October, when she was referred from her PCP for depression and also psychosocial stressors with her aging mother's and her health issues. Patient has been traveling back and fourth out of state to her mothers to assist. She has contemplated getting a therapist however the frequent travel has been a barrier. Patient now persuing med management adjustments through PCP and also trying online support group resources. Patient does not report further needs for resources at this time. Will keep patient open to Care Coordination for 4 more weeks and if patient does not call back with further resource needs, will return to regular follow up care with pcp.        Electronically signed by   Keturah Barre, [place] Work, Care Manager   Health Management and Education  [phone]

## 2015-08-29 NOTE — Telephone Encounter (Signed)
From: [name]  To: [name], MD  Sent: 08/28/2015 8:55 PM PDT  Subject: Test Result Question    Hi Dr. I see you on the 3rd. However I plan to have a blood draw on Thursday for my A1c. Is there anything else you would like done before you see me? Look forward to seeing you.     Thank you, Paula Daugherty

## 2015-08-30 ENCOUNTER — Ambulatory Visit: Payer: Medicare Other | Attending: Family Medicine

## 2015-08-30 DIAGNOSIS — E1121 Type 2 diabetes mellitus with diabetic nephropathy: Secondary | ICD-10-CM

## 2015-08-30 LAB — URINALYSIS AND CULTURE IF IND
Bilirubin Urine: NEGATIVE
Glucose Urine: >1000 mg/dL
Ketones: NEGATIVE mg/dL
Nitrite Urine: NEGATIVE
Occult Blood Urine: NEGATIVE mg/dL
Protein Urine: 50 mg/dL — AB
RBC: 2 /HPF (ref 0–5)
Specific Gravity: 1.03 (ref 1.002–1.030)
Squamous EPI: 4 /HPF (ref 0–5)
Urobilinogen.: NEGATIVE mg/dL (ref ?–2.0)
WBC: 9 /HPF — ABNORMAL HIGH (ref 0–5)
pH URINE: 5.5 (ref 4.8–7.8)

## 2015-08-30 LAB — LIPID PANEL WITH DLDL REFLEX
Cholesterol: 158 mg/dL (ref 0–200)
HDL Cholesterol: 58 mg/dL (ref >=35)
LDL Cholesterol Calculation: 74 mg/dL (ref <130)
Non-HDL Cholesterol: 100 mg/dL (ref 0–150)
Total Cholesterol:HDL Ratio: 2.7 (ref <4.0)
Triglyceride: 132 mg/dL (ref 35–160)

## 2015-08-30 LAB — COMPREHENSIVE METABOLIC PANEL
Alanine Transferase (ALT): 69 U/L — ABNORMAL HIGH (ref 5–54)
Albumin: 4.2 g/dL (ref 3.2–4.6)
Alkaline Phosphatase (ALP): 104 U/L (ref 35–115)
Aspartate Transaminase (AST): 71 U/L — ABNORMAL HIGH (ref 15–43)
Bilirubin Total: 0.9 mg/dL (ref 0.3–1.3)
Calcium: 10 mg/dL (ref 8.6–10.5)
Carbon Dioxide Total: 22 mEq/L — ABNORMAL LOW (ref 24–32)
Chloride: 104 mEq/L (ref 95–110)
Creatinine Blood: 0.88 mg/dL (ref 0.44–1.27)
E-GFR, African American: >60 SEE NOTE (ref >60)
E-GFR, Non-African American: >60 (ref >60)
Glucose: 139 mg/dL — ABNORMAL HIGH (ref 70–99)
Potassium: 4 mEq/L (ref 3.3–5.0)
Protein: 8.5 g/dL — ABNORMAL HIGH (ref 6.3–8.3)
Sodium: 142 mEq/L (ref 135–145)
Urea Nitrogen, Blood (BUN): 19 mg/dL (ref 8–22)

## 2015-08-30 LAB — CBC WITH DIFFERENTIAL
Basophils % Auto: 0.9 %
Basophils Abs Auto: 0.1 10*3/uL (ref 0–0.2)
Eosinophils % Auto: 3.1 %
Eosinophils Abs Auto: 0.3 10*3/uL (ref 0–0.5)
Hematocrit: 44.6 % (ref 36–46)
Hemoglobin: 14.4 g/dL (ref 12.0–16.0)
Lymphocytes % Auto: 22.8 %
Lymphocytes Abs Auto: 2 10*3/uL (ref 1.0–4.8)
MCH: 27 pg (ref 27–33)
MCHC: 32.3 % (ref 32–36)
MCV: 83.6 UM3 (ref 80–100)
MPV: 8.3 UM3 (ref 6.8–10.0)
Monocytes % Auto: 6.4 %
Monocytes Abs Auto: 0.6 10*3/uL (ref 0.1–0.8)
Neutrophils % Auto: 66.8 %
Neutrophils Abs Auto: 5.9 10*3/uL (ref 1.80–7.70)
Platelet Count: 217 10*3/uL (ref 130–400)
RDW: 15.6 UNITS — ABNORMAL HIGH (ref 0–14.7)
Red Blood Cell Count: 5.34 10*6/uL — ABNORMAL HIGH (ref 4.0–5.2)
White Blood Cell Count: 8.9 10*3/uL (ref 4.5–11.0)

## 2015-08-30 LAB — CREATININE SPOT URINE: Creatinine Spot Urine: 123.04 mg/dL

## 2015-08-30 LAB — TSH WITH FREE T4 REFLEX: Thyroid Stimulating Hormone: 1.22 u[IU]/mL (ref 0.35–3.30)

## 2015-08-30 LAB — MICROALBUMIN
Microalbumin Urine: 34.8 mg/dl
Microalbumin/Creatinine Ratio: 283 mg/g CR — ABNORMAL HIGH (ref <30)

## 2015-08-30 LAB — VITAMIN D, 25 HYDROXY: Vitamin D, 25 Hydroxy: 36.7 ng/mL (ref 20.0–50.0)

## 2015-08-31 LAB — HEMOGLOBIN A1C
Hgb A1C,Glucose Est Avg: 143 mg/dL
Hgb A1C: 6.6 % — ABNORMAL HIGH (ref 3.9–5.6)

## 2015-09-02 LAB — CULTURE URINE, BACTI

## 2015-09-06 ENCOUNTER — Ambulatory Visit: Payer: Medicare Other | Admitting: Dermatology

## 2015-09-06 ENCOUNTER — Encounter: Payer: Self-pay | Admitting: Dermatology

## 2015-09-06 DIAGNOSIS — L720 Epidermal cyst: Secondary | ICD-10-CM

## 2015-09-06 DIAGNOSIS — L570 Actinic keratosis: Secondary | ICD-10-CM

## 2015-09-06 DIAGNOSIS — L659 Nonscarring hair loss, unspecified: Secondary | ICD-10-CM

## 2015-09-06 MED ORDER — SPIRONOLACTONE 100 MG TABLET
100.0000 mg | ORAL_TABLET | Freq: Every day | ORAL | 11 refills | Status: DC
Start: 2015-09-06 — End: 2016-02-12

## 2015-09-06 MED ORDER — FLUOROURACIL 5 % TOPICAL CREAM
TOPICAL_CREAM | Freq: Two times a day (BID) | TOPICAL | 0 refills | Status: DC
Start: 2015-09-06 — End: 2016-06-13

## 2015-09-06 NOTE — Nursing Note (Signed)
Vital signs taken, allergies verified, screened for pain, tobacco hx verified.   Absalom Aro, MA

## 2015-09-06 NOTE — Progress Notes (Addendum)
09/06/2015, last seen 12/25/2014    I had the pleasure of seeing Paula Daugherty, a delightful 70yrfemale in follow up for the following  CC: nose lesions    Skin Cancer History:  None    Questionnaire dated 12/25/2014 reviewed by me today.    Review of Systems: As per dermatology patient questionnaire above reviewed on 09/06/2015 with no changes today: fever, fatigue, weight loss, night sweats, headache, chest pain, shortness of breath, visual changes, wheezing, cough, hay fever/pollen allergy, sore throat, bleeding gums, joint or muscle pain, lymph node swelling, infections, pains in calves, nausea, vomiting, diarrhea, burning with urination, depression/anxiety, numbness/tingling, or dizziness.  Positives are: cough, hay fever/pollen allergy, joint/muscle pain, depression/anxiety, dizziness. All other ROS are negative.     Past Medical History:   Patient Active Problem List    Diagnosis Date Noted    Ptosis of eyelid 08/06/2015    Cataract 12/18/2014    History of actinic keratoses 12/06/2014    Macroalbuminuric diabetic nephropathy 09/12/2014    Fatty liver 08/28/2014    Type 2 diabetes mellitus without complication 051/70/0174   Cough 12/14/2013    Renal cyst ( 6.4 cm cyst left kidney) 08/24/2013    Hyperlipidemia with target LDL less than 70 07/28/2013    Vitamin D insufficiency 07/28/2013    Abnormal LFTs 07/28/2013    DM2 (diabetes mellitus, type 2) 07/11/2013    Depression with anxiety 07/11/2013    Stress at home 07/11/2013    Leg cramps-right calf 07/11/2013    Right ear pain 07/11/2013    Fibromyalgia 07/11/2013    HTN (hypertension) 07/11/2013    GERD (gastroesophageal reflux disease) 07/11/2013       Allergies: Contrast dye [radiopaque agent]; Hydrochlorothiazide; Januvia [sitagliptin]; Lyrica [pregabalin]; Omnipaque [iohexol]; Prozac [fluoxetine hcl]; Sulfa (sulfonamide antibiotics); and Topiramate     Medications:   Current Outpatient Prescriptions on File Prior to Visit   Medication Sig  Dispense Refill    Amlodipine (NORVASC) 10 mg Tablet **12/08**TAKE 1 TABLET BY MOUTH EVERY DAY. 90 tablet 1    Aspirin 81 mg DR Tablet EC Tablet Take 1 tablet by mouth every day. 30 tablet 5    Atorvastatin (LIPITOR) 10 mg Tablet TAKE 1 TABLET BY MOUTH EVERY DAY AT BEDTIME. 90 tablet 1    Blood Sugar Diagnostic (ONETOUCH ULTRA TEST) Strips Use for testing blood glucose 3 times per day 300 strip 3    CALCIUM-MAGNESIUM-ZINC PO       canagliflozin (INVOKANA) 100 mg Tablet Take 1 tablet by mouth every day. 90 tablet 3    Cholecalciferol, Vitamin D3, (VITAMIN D-3) 2,000 unit Tablet Take 1 tablet by mouth every day.      Erythromycin 0.5% Ophthalmic Ointment Apply 0.5 inches to the affected area 3 times daily. 3.5 g 3    FLUTICASONE/SALMETEROL (ADVAIR DISKUS INHA) Take  by inhalation.      GlipiZIDE (GLUCOTROL XL) 5 mg SR Tablet Take 1 tablet by mouth every morning with a meal. 90 tablet 3    Losartan (COZAAR) 25 mg Tablet TAKE 1 TABLET BY MOUTH EVERY DAY. 90 tablet 1    Metformin (GLUCOPHAGE) 1,000 mg tablet Take 1 tablet by mouth 2 times daily with meals. (diabetes) 180 tablet 3    Neomycin/Polymyxin/Dexamethasone (MAXITROL) 3.5 mg/g-10,000 unit/g-0.1 % Ointment Ophthalmic Ointment Instill 0.5 inches into the affected eye(s) every day at bedtime. 1 tube 0    Omeprazole (PRILOSEC) 20 mg Delayed Release Capsule TAKE ONE CAPSULE BY MOUTH EVERY DAY 90 capsule  1    Oxybutynin (DITROPAN) 5 mg Tablet Take 5 mg by mouth 3 times daily.      Venlafaxine ([name]) 37.5 mg Tablet Take 2 tablets by mouth every day. Take in the morning on waking. 30 tablet 2     No current facility-administered medications on file prior to visit.      No current facility-administered medications for this visit.     Family History:                                             There is not a family history of skin cancer, type: N/A     Social History:  The patient does not smoke or use tobacco products.  EtOH Use: 1 /month  Occupation:  Retired      Physical Examination:  A full body skin exam was done relating to moles/pigmented lesions, skin photodamage.    LMP 05/06/1983  On physical examination, the patient is well developed, well nourished, and pleasant with normal affect. The patient is alert to time, place, and person.    The following areas were inspected and found to be normal except as specified below:   Head/scalp including palpation, face, eyes, eyelids and conjunctiva, nose, mouth/oral mucosa, lips, neck including palpation, chest (including the breasts and axillae), abdomen, buttocks/groin, back, right and left upper extremities, digit, nail, and the right and left lower extremities, digits, and nails. Digits/nails were inspected and palpated (for clubbing, cyanosis, inflammation). The lower extremities were inspected for peripheral swelling, varicosities.     Lab/Imaging/Pathology reviewed by me today: N/A    Impression/Problems/Plan:    Diagnosis: Pustule      C/O: Spot for days duration involving the head. Symptoms are red and tenderness. Past/current treatment: none      PE: 1 Folliculocentric pustule on the scalp.       Plan:   -- The patient was informed that these are benign lesions and no treatment is necessary.    Diagnosis: Seborrheic keratosis       C/O: Brown spot for months duration involving the back, nose. Symptoms are rough texture and brown color. Symptoms are none . Past/current treatment: none. The patient states the spot on the nose often becomes tender when her glasses touch the area.       PE:  Multiple brown, verrucous, stuck-on papules on the back, nose,       Plan: The benign nature of these skin lesions was explained to the patient. No treatment is indicated at this time. The patient has been educated about self examination, and if changes occur including change in size,color or if lesions become symptomatic, the patient was instructed to return for follow up.     Diagnosis: Actinic keratosis       C/O: Rough  scaly spots for months duration involving the nose. Symptoms are flaking. Past/current treatment: liquid nitrogen treatment.      PE: Smooth pink macule on the dorsal nose.      Plan:   -- Start Fluorouracil (EFUDEX) 5 % Cream; Apply to the affected area 2 times daily. Apply to lesions as directed on nose twice daily for 2-3 weeks  Dispense: 40 g; Refill: 0    Diagnosis: Androgenic alopecia      C/O: hair loss for years duration involving the scalp. The patient denies any scalp itching or burning. No  new medications. Symptoms are gradual hair loss. Denies any recent traumatic life events. No changes in hair care practices. Past treatment: none      PE: ill defined diffuse hair thinning primarily on the vertex scalp with preservation of the frontal hair line. No erythema or scaling of scalp. No scarring or follicular drop out.       Plan:   -- discussed starting minoxidil 5% foam daily, Viviscal, spironolactone and finasteride. The patient would ike to start with spironolactone.   Start Spironolactone (ALDACTONE) 100 mg Tablet; Take 1 tablet by mouth every day.  Dispense: 30 tablet; Refill: 11    Diagnosis: Solar lentigines  HPI: The patient reports brown spots on the chest area. Denies any change in color and size.   Exam: Reticulate hyperpigmented macules across the chest.   Plan:   - The patient was reassured of the benign nature of the lesions and no treatment performed today.      Diagnosis: Milia      C/O: The patient reports a spot on the nose that is often irritated and painful when she is wearing her glasses. Past/current treatment: none      PE: 1 mm white cystic lesion on the nose.      Plan:   -- Discussed that milia are very common, benign, keratin-filled cysts and are derived from the pilosebaceous follicle. No treatment indicated at this time.     Follow up: PRN    Education needs were addressed and there were no barriers to learning.     SCRIBE DISCLAIMER:   This note was scribed by Tomasa Hose, a  trained medical scribe, in the presence of Venita Lick, MD  Electronically signed by Tomasa Hose, Peter, 09/06/2015  10:08 AM    Agree with scribe documentation.     Venita Lick, MD  [employer]  Department of Dermatology

## 2015-09-07 ENCOUNTER — Encounter: Payer: Self-pay | Admitting: Neurology

## 2015-09-07 DIAGNOSIS — F32A Depression, unspecified: Secondary | ICD-10-CM

## 2015-09-07 DIAGNOSIS — F329 Major depressive disorder, single episode, unspecified: Principal | ICD-10-CM

## 2015-09-07 DIAGNOSIS — G4719 Other hypersomnia: Secondary | ICD-10-CM

## 2015-09-07 MED ORDER — VENLAFAXINE 75 MG TABLET
75.0000 mg | ORAL_TABLET | Freq: Every day | ORAL | 5 refills | Status: DC
Start: 2015-09-07 — End: 2015-10-09

## 2015-09-07 MED ORDER — VENLAFAXINE 37.5 MG TABLET
75.0000 mg | ORAL_TABLET | Freq: Every day | ORAL | 2 refills | Status: DC
Start: 2015-09-07 — End: 2015-09-07

## 2015-09-07 NOTE — Addendum Note (Signed)
Addended by: Jenita Seashore on: 09/07/2015 08:52 AM     Modules accepted: Orders

## 2015-09-07 NOTE — Telephone Encounter (Signed)
From: [name]  To: [name], MD  Sent: 09/06/2015 7:41 PM PDT  Subject: Visit Follow-up Question    Hi Dr. , I started the 30m of [name] last week. I know it's to soon to see change. My issue is that the order came as 37.50 mg and take 2. They only gave me enough for 2 weeks. Is it possible to order 75 mg tabs? Or order for a longer period of time? I leave on May 11th for 2 1/2 weeks and as of now I will not have enough to last. Thank you for any help you can provide.

## 2015-09-12 ENCOUNTER — Ambulatory Visit (HOSPITAL_BASED_OUTPATIENT_CLINIC_OR_DEPARTMENT_OTHER): Payer: Medicare Other | Admitting: Ophthalmology

## 2015-09-12 ENCOUNTER — Ambulatory Visit: Payer: Medicare Other | Attending: Ophthalmology | Admitting: Family Medicine

## 2015-09-12 ENCOUNTER — Encounter: Payer: Self-pay | Admitting: Ophthalmology

## 2015-09-12 ENCOUNTER — Encounter: Payer: Self-pay | Admitting: Family Medicine

## 2015-09-12 VITALS — BP 128/77 | HR 72 | Temp 98.1°F | Resp 11 | Ht 62.0 in | Wt 179.7 lb

## 2015-09-12 DIAGNOSIS — Z13820 Encounter for screening for osteoporosis: Secondary | ICD-10-CM

## 2015-09-12 DIAGNOSIS — Z9889 Other specified postprocedural states: Secondary | ICD-10-CM

## 2015-09-12 DIAGNOSIS — Z1239 Encounter for other screening for malignant neoplasm of breast: Secondary | ICD-10-CM | POA: Insufficient documentation

## 2015-09-12 DIAGNOSIS — E119 Type 2 diabetes mellitus without complications: Secondary | ICD-10-CM | POA: Insufficient documentation

## 2015-09-12 DIAGNOSIS — Z48810 Encounter for surgical aftercare following surgery on the sense organs: Secondary | ICD-10-CM | POA: Insufficient documentation

## 2015-09-12 DIAGNOSIS — K760 Fatty (change of) liver, not elsewhere classified: Secondary | ICD-10-CM | POA: Insufficient documentation

## 2015-09-12 DIAGNOSIS — I10 Essential (primary) hypertension: Secondary | ICD-10-CM | POA: Insufficient documentation

## 2015-09-12 MED ORDER — GLIPIZIDE ER 10 MG TABLET, EXTENDED RELEASE 24 HR
1.0000 | EXTENDED_RELEASE_CAPSULE | Freq: Every day | ORAL | 5 refills | Status: DC
Start: 2015-09-12 — End: 2016-03-07

## 2015-09-12 MED ORDER — METFORMIN 1,000 MG TABLET: 1.0000 | ORAL_TABLET | Freq: Two times a day (BID) | ORAL | 3 refills | Status: DC

## 2015-09-12 NOTE — Patient Instructions (Addendum)
A laboratory test has been ordered for you.     An ultrasound has been ordered for you; please call [phone] option 3 to schedule an appointment in [place]. You will receive a letter or telephone call with your results.     A DEXA (bone density) scan has been ordered for you; please call [phone] option 1 to schedule an appointment in [place]. You will receive a letter or telephone call with your results.     A mammogram has been ordered for you.  Please call [phone] to schedule an appointment in [place]. You will receive a letter or telephone call with your results.       - - - - - - - - - - - - - - - Information on Prostate Cancer - - - - - - - - - - - - - - -    The prostate is a gland, about the size of a walnut, located under the bladder and surrounding the upper part of the urethra. The urethra is a tube that carries urine and semen through the penis to the outside of the body. The prostate gland is found only in men. It produces semen, the liquid that carries the sperm when a man ejaculates.    Prostate cancer starts in the cells of the prostate gland. When a man has prostate cancer, the prostate cancer cells grow into a tumor. These cancer cells can also spread into other parts of the body.    The most common symptom of prostate cancer is no symptom at all.     Risk Factors and Prevention:    While 1 man out of 6 will be diagnosed with prostate cancer during his lifetime, only 19 man in 9 will die of this disease. The death rate for prostate cancer is going down. And the disease is being found earlier as well.     Prostate cancer is a disease that is often associated with aging. About 72 percent of men are aged 31 years and older when diagnosed with prostate cancer.     African American men are at especially high risk for prostate cancer: They are over 39 percent more likely to develop this disease than non-Hispanic white men. African American men are twice as likely to die of this  disease. Prostate cancer occurs less often in Asian men than in whites.    Men with close family members (father or brother) who have had prostate cancer are more likely to get it themselves, especially if their relatives were young when they got the disease.     In [place], approximately [phone] men will be diagnosed with prostate cancer in 2007, 3,010 men will die from it and 114,600 men will continue living with it.     Because we don't know the exact cause of prostate cancer, it is not possible to give good advice about prevention. Some experts think that diet can reduce the risk of developing prostate cancer by eating less red meat and fat, and eating more fruits, vegetables and grains.    Prostate Cancer Screening Options:    Prostate cancer can often be found early by testing the amount of Prostate-Specific Antigen (PSA) in a man's blood. PSA is a substance made by the normal prostate gland. Although PSA is mostly found in semen, a small amount is also found in the blood. There is no question that the PSA test can help spot prostate cancer. But it can't tell how  dangerous the cancer is.    Another way to find prostate cancer is the Digital Rectal Exam (DRE). To do the DRE, the doctor inserts a gloved, lubricated finger into the rectum to feel for any irregular or firm areas that might be cancer. The DRE is less effective than the PSA blood test in finding prostate cancer, but it can sometimes find cancers in men with normal PSA levels. For this reason, the [place] (ACS) recommends that when prostate cancer screening is done, both the DRE and the PSA should be used.     These tests are not always accurate, however. Wrong test results could lead to excess worry, or even an unneeded biopsy or other tests. Until more is known, one should talk to his doctor about whether or not he needs to be tested. Things to take into account are age and health. If one is young and gets prostate cancer, it will  probably shorten his life if it is not caught early. But if one is older or in poor health, then prostate cancer may never become a major problem because it often grows so slowly.     ACS believes that doctors should offer the PSA blood test and DRE yearly, beginning at age 52 to men who do not have any major medical problems and can be expected to live at least 10 more years. Men at high risk should begin testing at age 71. Men at high risk include African Americans and men who have a close relative who had prostate cancer before age 76. Men at even higher risk (have several close relatives with prostate cancer at an early age) could begin testing at age 23.     If certain symptoms or the results of early tests suggest one might have prostate cancer, the doctor will use further tests to find out whether the disease is present.    The prostate biopsy is the only way to know for sure if a man has prostate cancer. During a biopsy, tissue from a man's prostate is removed so it can be sent to the lab to see if there are cancer cells. A core needle biopsy is the main method used.    Cancer Treatment:    Most of the time, prostate cancer grows slowly. Autopsy studies show that many older men who died of other diseases also had prostate cancer that neither they nor their doctor were aware of. But sometimes prostate cancer can grow and spread quickly. Even with the latest methods, it is hard to tell which prostate cancers will grow slowly and which will grow quickly.     If cancer is present, the biopsy sample will be graded. Grading the cancer helps to predict how fast the cancer is likely to grow and spread. A staging system is a way to describe the extent to which the cancer has spread.    Surgery, radiation, and hormone therapy are the most common treatments for prostate cancer. Chemotherapy may be used in some cases, and watchful waiting, though not an active form of treatment, may also be used.    There are risks and  significant side effects with all forms of treatment for prostate cancer. They include impotence, sterility, and incontinence.    Several factors should be taken into account before one chooses a course of treatment. These factors include age, overall health, goals for treatment, and one's feelings about side effects, risks and complications of treatment.. Some men, for example, can't imagine  living with side effects such as incontinence or impotence. Others are less concerned about these and more concerned about getting rid of the cancer.  It is often helpful to discuss treatment options with more than one doctor. Many men find that talking to others who have faced the same issues is helpful.    Prostate Cancer Survival Rates:    Overall, 43 percent of men diagnosed with prostate cancer survive at least 5 years. Ninety one percent of all prostate cancers are found while they are still within the prostate or only in nearby areas. Nearly 100 percent of these men survive at least 5 years. For the men whose cancer has already spread to distant parts of the body when it is found, 34 percent will survive at least 5 years.    Sources:  1. American Cancer Society, Overview: Prostate Cancer. Available @ ClassMovie.be. Idelle Leech:  January 2007  2. IMPACT, What Every Man Should Know About Prostate Cancer? Brochure.  Available @ http://www.[place]-impact.org/public/genlbro.pdf . Accessed: January 2007.  [place], [employer], and [employer], [employer], [place], [employer], [place]. September 2006

## 2015-09-12 NOTE — Progress Notes (Signed)
[facility]  Post-op Resident Note    HPI: Paula Daugherty is a 70yrold female who presents for post-op visit following upper blepharoplasty and ptosis repair performed on 08/06/15.     Patient reports that her eyes are doing well. Denies pain, only with some pressure. States mild itching, helped with the ointment.    External Exam:   Minimal edema and ecchymosis.   Incision sites are clean and dry without evidence of infection.     Impression:  1. POM #1 s/p ptosis correction and blepharoplasty OU - doing well.     Plan:   1. The patient's questions about postoperative care plan and work/activity limitations were answered. The patient was instructed to keep wound clean, using gentle application of soap and water only, and to refrain from putting on make up around wound or swimming for 4 weeks after surgery.     2. The patient was warned of the signs and symptoms of serious problems related to recent surgery, such as change in vision, pain, increasing redness, or purulent discharge. The patient was advised to call [phone] any questions or concerns.    Please see Dr. LAron Babanote for final impression and plan.      JSusy Frizzle M.D. Ph.D.  Ophthalmology, PGY-IV  [facility]

## 2015-09-12 NOTE — Nursing Note (Signed)
vital signs taken,allergies and pharmacy verified,screened for pain.

## 2015-09-12 NOTE — Progress Notes (Signed)
Paula Daugherty is a 70yrold female patient here for follow-up with PCP    1. DM: needs refill for Metformin; patient also reports possible too many yeast infections with Invokana and wishes to drop this and increase Glipizide to 10 mg--prescription sent   2. HTN: She reports .taking medications as instructed, no medication side effects noted, no TIA's, no chest pain on exertion, no dyspnea on exertion, no swelling of ankles; blood pressure reads: none  3. Fatty liver: per previous ultrasound; seen by hepatology and patient was asked for further workup not completed due to possible expense     ROS:   Constitutional: negative for fatigue, night sweats, malaise, anorexia, fever; stable weight.   Eyes: negative for blurry vision, double vision, visual loss, red eyes, photophobia, eye pain; having routine eye exams.   Ears, Nose, Mouth, Throat: negative for difficulty swallowing, persistent sore throat, hoarseness, dentures, post nasal drip, nosebleeds, rhinorrhea, loss of sense of smell, hearing loss, ear pain, tinnitus, discharge from ears, dental problem, change in taste, odynophagia.   CV: negative for palpitations, syncope, chest pain, paroxysmal nocturnal dyspnea, orthopnea, lower extremity edema, cyanosis, claudication.   Resp: negative for cough, sputum, hemoptysis, shortness of breath, excessive snoring, apnea spells while sleeping, pleuritic pain, wheezing, dyspnea on exertion.   GI: negative for vomiting, dysphagia, nausea, heartburn or reflux, early satiety, hematemesis, abdominal pain, excessive gas or bloating, change in caliber of stool, hemorrhoids, bleeding from rectum, melena, hematochezia, constipation, diarrhea, jaundice.   GU: negative for nocturia, dysuria, decreased force of stream, frequency, hesitancy, hematuria, retention, incontinence.   Musculoskeletal: baseline aches; negative for AM joint stiffness, joint swelling, joint pain, joint redness, back pain, muscle weakness, muscle pain, muscle  cramping.   Integumentary: negative for moles that have changed, dark lesions, rash, itching, bruising, lumps or bumps, hair loss.   Neuro: negative for confusion, headaches, feel faint, seizures, vertigo, memory loss, paralysis/weakness, numbness or tingling, clumsiness, tremor, speech impairment.   Psych: Mood pt's report Negative for anhedonia, excessive guilt, depressed mood, crying spells, insomnia, early AM awakening, anxiety, impaired concentration, angry, suicidal ideation, abusive relationship.   Endo: negative for cold intolerance, heat intolerance, polyphagia, polydipsia, polyuria, hair loss, excessive hair growth.   Heme/Lymphatic: negative for recent anemia, bleeding disorder, abnormal bleeding, abnormal bruising, swollen nodes.   Allergy/Immun: negative for hay fever, itchy eyes, itchy nose, clear nasal secretions, rash.   GYN: s/p partial hysterectomy; no spotting; negative for bleeding, vaginal discharge, pain with intercourse, hot flashes, lump in breast, nipple bleeding or discharge, tenderness in breast; no breast skin changes or nipple discharge         Past Medical History   Diagnosis Date    Asthma     Diabetes mellitus     Hypertension        Current Outpatient Prescriptions on File Prior to Visit   Medication Sig Dispense Refill    Amlodipine (NORVASC) 10 mg Tablet **12/08**TAKE 1 TABLET BY MOUTH EVERY DAY. 90 tablet 1    Aspirin 81 mg DR Tablet EC Tablet Take 1 tablet by mouth every day. 30 tablet 5    Atorvastatin (LIPITOR) 10 mg Tablet TAKE 1 TABLET BY MOUTH EVERY DAY AT BEDTIME. 90 tablet 1    Blood Sugar Diagnostic (ONETOUCH ULTRA TEST) Strips Use for testing blood glucose 3 times per day 300 strip 3    CALCIUM-MAGNESIUM-ZINC PO       canagliflozin (INVOKANA) 100 mg Tablet Take 1 tablet by mouth every day. 90 tablet 3  Cholecalciferol, Vitamin D3, (VITAMIN D-3) 2,000 unit Tablet Take 1 tablet by mouth every day.      Erythromycin 0.5% Ophthalmic Ointment Apply 0.5 inches to  the affected area 3 times daily. 3.5 g 3    Fluorouracil (EFUDEX) 5 % Cream Apply to the affected area 2 times daily. Apply to lesions as directed on nose twice daily for 2-3 weeks 40 g 0    FLUTICASONE/SALMETEROL (ADVAIR DISKUS INHA) Take  by inhalation.      Losartan (COZAAR) 25 mg Tablet TAKE 1 TABLET BY MOUTH EVERY DAY. 90 tablet 1    Neomycin/Polymyxin/Dexamethasone (MAXITROL) 3.5 mg/g-10,000 unit/g-0.1 % Ointment Ophthalmic Ointment Instill 0.5 inches into the affected eye(s) every day at bedtime. 1 tube 0    Omeprazole (PRILOSEC) 20 mg Delayed Release Capsule TAKE ONE CAPSULE BY MOUTH EVERY DAY 90 capsule 1    Oxybutynin (DITROPAN) 5 mg Tablet Take 5 mg by mouth 3 times daily.      Spironolactone (ALDACTONE) 100 mg Tablet Take 1 tablet by mouth every day. 30 tablet 11    Venlafaxine ([name]) 75 mg Tablet Take 1 tablet by mouth every day. 30 tablet 5     No current facility-administered medications on file prior to visit.      Allergies:   Allergies   Allergen Reactions    Contrast Dye [Radiopaque Agent] Hives    Hydrochlorothiazide Other-Reaction in Comments     Patient reports    Januvia [Sitagliptin] Other-Reaction in Comments     Patient reports    Lyrica [Pregabalin] Other-Reaction in Comments     Patient reports    Omnipaque [Iohexol] Other-Reaction in Comments     Patient reports    Prozac [Fluoxetine Hcl] Other-Reaction in Comments     Patient reports    Sulfa (Sulfonamide Antibiotics) Rash    Topiramate Other-Reaction in Comments     Hairfall       Social History     Social History    Marital status: MARRIED     Spouse name: N/A    Number of children: 1    Years of education: N/A     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[employer]       Social History Main Topics    Smoking status: Former Smoker     Packs/day: 0.10     Years: 20.00    Smokeless tobacco: Never Used      Comment: quit 30 years ago    Alcohol use 0.0 oz/week     0 Standard drinks or equivalent per week       Comment: rare shares a beer or glass wine every 2-3 months     Drug use: No    Sexual activity: Yes     Partners: Male     Other Topics Concern    None     Social History Narrative     Family History   Problem Relation Age of Onset    Diabetes Father     Heart Mother      MI at 67s     Non-contributory Brother     Non-contributory Brother        PE:  BP 128/77  Pulse 72  Temp 36.7 C (98.1 F)  Resp 11  Ht 1.575 m (5' 2" )  Wt 81.5 kg (179 lb 10.8 oz)  LMP 05/06/1983  SpO2 97%  BMI 32.86 kg/m2  General Appearance: healthy, alert, no distress, pleasant affect, cooperative.  Eyes:  conjunctivae and corneas clear. PERRL, EOM's intact. sclerae normal.  Ears:  normal TMs and canal and external inspection of ears show no abnormality.  Nose:  nasal mucosa normal.  Mouth: good dentition; no lesions.  Neck:  Neck supple. No adenopathy, thyroid symmetric, normal size.  Heart:  normal rate and regular rhythm, no murmurs, clicks, or gallops.  Lungs: clear to auscultation.  Abdomen: BS normal.  Abdomen soft, non-tender.  No masses or organomegaly.  Extremities:  no cyanosis, clubbing, or edema.  Skin:  Skin color, texture, turgor normal. No rashes or lesions.  Neuro: grossly intact.  Musculoskeletal: normal tone  Breast:  nipples and areolae negative to inspection and palpation, no nodules palpable, axillae negative, no LAD.    Assessment and Plan:  (E11.9) Type 2 diabetes mellitus without complication, without long-term current use of insulin  (primary encounter diagnosis)  Comment: at goal per recent A1c but wishes to drop Invokana due to reported yeast infections and wishes to increase glipizide   Plan: Metformin (GLUCOPHAGE) 1,000 mg tablet,         GlipiZIDE (GLUCOTROL XL) 10 mg SR Tablet,         URINALYSIS AND CULTURE IF IND          (K76.0) Fatty liver  Comment: per previous ultrasound; seen by hepatology but patient did not complete lab workup due to concern for expense   Plan: [place] FIBROSCAN + COMPLETE  ABDOMEN, HEPATIC         FUNCTION PANEL          (I10) Essential hypertension  Comment: at goal   Plan: to monitor     (Z13.820) Osteoporosis screening  Comment: due  Plan: DEXA, COMPLETE          (Z12.39) Breast cancer screening  Comment: no discrete findings   Plan: BREAST MAMMOGRAM SCREENING              Total encounter time including history, physical examination, and coordination of care was approximately 40 minutes of which more than 50% was spent counseling regarding assessment/diagnosis and treatment plan. No guarantees were made regarding her medical care or treatment outcome. Barriers to Learning: none.  Patient verbalizes understanding of teaching and instructions.    Electronically signed by:    [name], MD  [place], [place] Board of Family Medicine  Associate physician [facility], [place]   [phone]

## 2015-09-12 NOTE — Progress Notes (Signed)
Chief Complaint   Patient presents with    Post-op     Post op follow up ~ Ptosis correction, both upper eyelid (bundled with Functional blepharoplasty, OU) (08/06/2015). CC: Pt reports mild ache. Compliant w/ung. [place] w/results. Worries about redness.     Doing well  F/u as needed    I confirm that I reviewed the patient's medical history and other pertinent data, and personally examined the patient.     I agree with the exam findings, treatment plan, and medical decision making reported by the resident, with any exceptions as noted above.  Nyra Jabs, MD  Ophthalmic Plastic and Orbital Surgery  Dept. of Ophthalmology & Visual Science

## 2015-09-18 ENCOUNTER — Other Ambulatory Visit: Payer: Self-pay

## 2015-09-18 NOTE — Progress Notes (Signed)
Health Management and Education      Reviewed the pertinent portion of patients chart. Patient now on meds for depression and reports improved mood, also with caregiver support group resources. Patient has regular follow up with medical providers. No further needs indicated for Care Coordination at this time. Will notify MD and return to [facility] for return to regular follow up care. Patient has this LCSWs number if questions arise.       Electronically signed by   Keturah Barre, [place] Work, Care Manager   Health Management and Education  [phone]

## 2015-09-20 ENCOUNTER — Other Ambulatory Visit: Payer: Self-pay

## 2015-09-20 NOTE — Progress Notes (Signed)
Health Management & Education    Warm hand-off received from HME Linda Goode, LCSW [id number].  Pertinent portions of the chart reviewed.  No further care coordination needs identified at this time.  Returned to usual care.     Electronically Signed By:  Burdette Gergely  RN Case Manager  [facility]  [phone]

## 2015-09-25 ENCOUNTER — Encounter: Payer: Self-pay | Admitting: "Endocrinology

## 2015-09-26 ENCOUNTER — Ambulatory Visit: Payer: Medicare Other | Admitting: Orthopaedic Surgery

## 2015-10-08 ENCOUNTER — Encounter: Payer: Self-pay | Admitting: Neurology

## 2015-10-08 ENCOUNTER — Encounter: Payer: Self-pay | Admitting: Family Medicine

## 2015-10-08 NOTE — Telephone Encounter (Signed)
From: [name]  To: [name], MD  Sent: 10/08/2015 11:40 AM PDT  Subject: Non-urgent Medical Advice Question    Hello Dr. Wynelle Link from our trip. When I saw you last we discussed pain in my left hand. You said if it didn't get better left you know and you would order an xray. It remains painful and I've not been hitting DJ. Please let me know what you think.     Thank you, Paula Daugherty

## 2015-10-08 NOTE — Telephone Encounter (Signed)
From: [name]  To: [name], MD  Sent: 10/08/2015 11:37 AM PDT  Subject: Non-urgent Medical Advice Question    Hello, back from trip. Wanted you to know I seem a "little less depressed" but still tired and want to sleep all the time. Stress level remains high with my Mom. Plus daughter was hospitalized while we were gone. Is there anything else I should be doing? Thank you for everything.     Thank you, Paula Daugherty

## 2015-10-09 MED ORDER — VENLAFAXINE 75 MG TABLET
150.0000 mg | ORAL_TABLET | Freq: Every day | ORAL | 2 refills | Status: DC
Start: 2015-10-09 — End: 2016-01-19

## 2015-10-09 NOTE — Telephone Encounter (Signed)
Neuro / Sleep Med Chart Update 10/09/15:  Patient reports slightly improved mood but continued daytime sleepiness on venlafaxine 75 mg daily. Recommend increase to 150 mg daily. Patient also reports increased life stressors (relationship with her mother and her daughter had to be hospitalized).    Jenita Seashore, MD  Neuro / Sleep Med

## 2015-10-11 ENCOUNTER — Ambulatory Visit: Payer: Medicare Other

## 2015-10-26 ENCOUNTER — Ambulatory Visit (HOSPITAL_BASED_OUTPATIENT_CLINIC_OR_DEPARTMENT_OTHER)
Admission: RE | Admit: 2015-10-26 | Discharge: 2015-10-26 | Disposition: A | Discharge status: Home / Self Care | Payer: Medicare Other | Source: Ambulatory Visit | Attending: Family Medicine | Admitting: Family Medicine

## 2015-10-26 ENCOUNTER — Ambulatory Visit
Admission: RE | Admit: 2015-10-26 | Discharge: 2015-10-26 | Disposition: A | Discharge status: Home / Self Care | Payer: Medicare Other | Source: Ambulatory Visit | Attending: Diagnostic Radiology | Admitting: Diagnostic Radiology

## 2015-10-26 DIAGNOSIS — Z13820 Encounter for screening for osteoporosis: Secondary | ICD-10-CM | POA: Insufficient documentation

## 2015-10-26 DIAGNOSIS — Z1231 Encounter for screening mammogram for malignant neoplasm of breast: Secondary | ICD-10-CM

## 2015-10-26 DIAGNOSIS — M8588 Other specified disorders of bone density and structure, other site: Secondary | ICD-10-CM

## 2015-10-26 DIAGNOSIS — Z1239 Encounter for other screening for malignant neoplasm of breast: Secondary | ICD-10-CM

## 2015-10-30 ENCOUNTER — Other Ambulatory Visit: Payer: Self-pay | Admitting: "Endocrinology

## 2015-10-30 ENCOUNTER — Ambulatory Visit: Payer: Medicare Other | Admitting: "Endocrinology

## 2015-10-30 ENCOUNTER — Encounter: Payer: Self-pay | Admitting: "Endocrinology

## 2015-10-30 VITALS — BP 120/76 | HR 81 | Ht 62.0 in | Wt 178.1 lb

## 2015-10-30 DIAGNOSIS — E1129 Type 2 diabetes mellitus with other diabetic kidney complication: Secondary | ICD-10-CM

## 2015-10-30 DIAGNOSIS — E1121 Type 2 diabetes mellitus with diabetic nephropathy: Secondary | ICD-10-CM

## 2015-10-30 DIAGNOSIS — E1165 Type 2 diabetes mellitus with hyperglycemia: Secondary | ICD-10-CM

## 2015-10-30 MED ORDER — ATORVASTATIN 10 MG TABLET: 1.0000 | ORAL_TABLET | Freq: Every day | ORAL | 3 refills | Status: DC

## 2015-10-30 MED ORDER — LOSARTAN 25 MG TABLET
1.0000 | ORAL_TABLET | Freq: Every day | ORAL | 3 refills | Status: DC
Start: 2015-10-30 — End: 2016-03-07

## 2015-10-30 NOTE — Nursing Note (Signed)
Vital signs taken, allergies verified, screened for pain.  Obryan Radu, LVN

## 2015-10-30 NOTE — Progress Notes (Signed)
Endocrinology Follow up clinic note:    Chief Complaint:  "I'm here to follow up on my diabetes."    HPI: Paula Daugherty is a 70yrold female who has a diagnosis of type 2 diabetes mellitus who presents to Endocrinology for follow up. Her history is as follows:  She was initially diagnosed with diabetes around 2000 on routine labs. She has a strong family history of diabetes so she wasn't surprised at this. She was started on Metformin around 2005 and then she was started on insulin in 2014. She was up to 64 units of Lantus at night. However, after making changes to her diet and lifestyle, she was taken off insulin in 2015.        She also has a history of an elevated alk phos level. A GGT level was checked and was found to be elevated. A bone specific alkaline phosphatase was also found to be elevated. PTH level was normal.     Interim History: She returns today for follow up. Her last visit with Endocrinology was on 03/27/2015. Since this visit, she states that she has been doing ok. She thinks that overall, her blood glucose has been under control but she will have times when it will increase or decrease. She stopped Invokana because she did not think that this medication was doing much for her and it had a high copay. Because of this, her Glipizide dose was increased to 10 mg daily.     She has needed to continue to travel [place] and [place] her and when she is away, she has a harder time sticking with a more carbohydrate controlled diet.     Current Regimen:                          Metformin 1000 mg BID                        Glipizide 10 mg daily  Hypoglycemia: Yes, will go as low as the 40s every couple of weeks - attributes to not eating properly  Hyperglycemia: Yes, will sometimes increase to 200s  Meter brought today: Yes  Checks blood glucose: 1x/day (morning)  Home blood glucose numbers per pt's log:    Blood Glucose:                           Pre-breakfast: 111 to 230s (140s- 170s most of the time)  Diet and Exercise: Has been working on portion control and eating more vegetables                        Breakfast: Sometimes will skip, cereal with milk and banana, or eggs, coffee (with cream) or hot tea to drink                        Lunch: Will eat out most days when travelling, at home will eat leftovers (pork chop and vegetables, mashed potatoes), 1/2 sandwich, carrots, [employer] snack boxes                        Dinner: Tries to have smaller dinner - grilled or baked, salads  Snacks: Will snack most often at bedtime - fruit, sometimes has ice cream or pie if its in the house                        Exercise: Very minimal these days due to fatigue, has been trying to get more pool time   Last eye exam: 04/2015, no retinopathy, f/u 1 year  Foot exam: Will do today     ROS:  All other systems were reviewed and are negatative except for pertinent positive and negative responses as documented in HPI.     Medications:  Medication reconciliation was performed today.   Outpatient Prescriptions Marked as Taking for the 10/30/15 encounter (Office Visit) with Charlane Ferretti, MD   Medication Sig Dispense Refill    Amlodipine (NORVASC) 10 mg Tablet **12/08**TAKE 1 TABLET BY MOUTH EVERY DAY. 90 tablet 1    Aspirin 81 mg DR Tablet EC Tablet Take 1 tablet by mouth every day. 30 tablet 5    Atorvastatin (LIPITOR) 10 mg Tablet TAKE 1 TABLET BY MOUTH EVERY DAY AT BEDTIME. 90 tablet 1    Blood Sugar Diagnostic (ONETOUCH ULTRA TEST) Strips Use for testing blood glucose 3 times per day 300 strip 3    Fluorouracil (EFUDEX) 5 % Cream Apply to the affected area 2 times daily. Apply to lesions as directed on nose twice daily for 2-3 weeks 40 g 0    FLUTICASONE/SALMETEROL (ADVAIR DISKUS INHA) Take  by inhalation.      GlipiZIDE (GLUCOTROL XL) 10 mg SR Tablet Take 1 tablet by mouth every morning with a meal. (diabetes) 30 tablet 5    Losartan  (COZAAR) 25 mg Tablet TAKE 1 TABLET BY MOUTH EVERY DAY. 90 tablet 1    Metformin (GLUCOPHAGE) 1,000 mg tablet Take 1 tablet by mouth 2 times daily with meals. (diabetes) 180 tablet 3    Omeprazole (PRILOSEC) 20 mg Delayed Release Capsule TAKE ONE CAPSULE BY MOUTH EVERY DAY 90 capsule 1    Oxybutynin (DITROPAN) 5 mg Tablet Take 5 mg by mouth 3 times daily.      Spironolactone (ALDACTONE) 100 mg Tablet Take 1 tablet by mouth every day. 30 tablet 11    Venlafaxine ([name]) 75 mg Tablet Take 2 tablets by mouth every morning. 60 tablet 2     I did review patient's past medical and family/social history, no changes noted.   PMH:  Past medical history was reviewed from problem list.   Patient Active Problem List   Diagnosis    DM2 (diabetes mellitus, type 2)    Depression with anxiety    Stress at home    Leg cramps-right calf    Right ear pain    Fibromyalgia    HTN (hypertension)    GERD (gastroesophageal reflux disease)    Hyperlipidemia with target LDL less than 70    Vitamin D insufficiency    Abnormal LFTs    Renal cyst ( 6.4 cm cyst left kidney)    Cough    Type 2 diabetes mellitus without complication    Fatty liver    Macroalbuminuric diabetic nephropathy    History of actinic keratoses    Cataract    Ptosis of eyelid     VITAL SIGNS:  BP 120/76  Pulse 81  Ht 1.575 m (_0 )  Wt 80.8 kg (178 lb 1.6 oz)  LMP 05/06/1983  SpO2 96%  BMI 32.57 kg/m2  Body mass index is 32.57 kg/(m^2).  PHYSICAL EXAM:  General Appearance: healthy, alert, no distress, pleasant affect, cooperative.  Eyes:  conjunctivae and corneas clear. PERRL, EOM's intact. sclerae normal.  Mouth: normal.  Neck:  Neck supple. No adenopathy, thyroid symmetric, normal size.  Heart:  normal rate and regular rhythm, no murmurs, clicks, or gallops.  Lungs: clear to auscultation.  Abdomen: BS normal.  Abdomen soft, non-tender.  No masses or organomegaly.  Extremities:  no cyanosis, clubbing, or edema.  Foot Exam: normal DP and  PT pulses, no trophic changes or ulcerative lesions, normal sensory exam and normal monofilament exam.  Skin:  Skin color, texture, turgor normal. No rashes or lesions.  Neuro: Gait normal. No tremor appreciated. Sensation and strength grossly normal.  Mental Status: Appearance/Cooperation: in no apparent distress and well developed and well nourished  Eye Contact: normal  Speech: normal volume, rate, and pitch    LAB TESTS/STUDIES:   I personally reviewed the following laboratory studies     08/22/2014 08:15 12/06/2014 11:45 03/23/2015 09:04 08/30/2015 08:35   SODIUM 141   142   POTASSIUM 4.2   4.0   CHLORIDE 103   104   CARBON DIOXIDE TOTAL 25   22 (L)   UREA NITROGEN, BLOOD (BUN) 17   19   CREATININE BLOOD 0.82   0.88   E-GFR, AFRICAN AMERICAN >60   >60   E-GFR, NON-AFRICAN AMERICAN >60   >60   GLUCOSE 109 (H)   139 (H)   CALCIUM 9.5   10.0   PROTEIN 7.7   8.5 (H)   ALBUMIN 3.9   4.2   ALKALINE PHOSPHATASE (ALP) 97   104   ASPARTATE TRANSAMINASE (AST) 40   71 (H)   BILIRUBIN TOTAL 0.8   0.9   ALANINE TRANSFERASE (ALT) 38   69 (H)   URIC ACID, BLOOD 5.7      FASTING YES   YES   CHOLESTEROL 165   158   TRIGLYCERIDE 134   132   LDL CHOLESTEROL CALCULATION 87   74   HDL CHOLESTEROL 51   58   NON-HDL CHOLESTEROL 114   100   TOTAL CHOLESTEROL:HDL RATIO 3.2   2.7   THYROID STIMULATING HORMONE    1.22   HGB A1C 6.7 (H) 6.9 (H) 7.7 (H) 6.6 (H)   HGB A1C,GLUCOSE EST AVG 146 151 174 143   VITAMIN D, 25 HYDROXY    36.7      08/30/2015 08:35   WHITE BLOOD CELL COUNT 8.9   HEMOGLOBIN 14.4   HEMATOCRIT 44.6   MCV 83.6   RDW 15.6 (H)   PLATELET COUNT 217      03/23/2015 09:04 08/30/2015 08:49   CREATININE SPOT URINE 90.24 123.04   MICROALBUMIN URINE 10.2 34.8   MICROALBUMIN/CREATININE RATIO 113 (H) 283 (H)     DXA (10/26/2015):  FINDINGS:  Anatomic Location: AP LUMBAR SPINE  BMD (gm/cm2): 1.032   SD above (+)/below, (-) T score: -0.1   SD above (+)/below, (-) Z score: 1.8    Anatomic Location: LEFT FEMORAL NECK   BMD (gm/cm2): 0.585    SD above (+)/below, (-) T score: -2.4   SD above (+)/below, (-) Z score: -0.7    Anatomic Location: LEFT TOTAL HIP  BMD (gm/cm2): 1.100   SD above (+)/below, (-) T score: 1.3   SD above (+)/below, (-) Z score: 2.7     Impression: This is a 70yrold female with Diabetes Mellitus Type II who presents to Endocrinology for follow  up. Overall, her glycemic control atgoal as based on her most recent A1c of 6.6%. She is not having frequent hypoglycemia and for the most part, her blood glucose has been well controlled. Because of this, I have not recommended any changes to her medications today. We discussed dietary modifications to help improve her glycemic control overall.     She also has a history of an elevated alk phos level. GGT was elevated which suggests a liver source and she was found to have an enlarged liver with increased echogenicity suggestive of fatty infiltration on abdominal ultrasound in 12/2013. Her last alk phos in 08/2015 was in the normal range. She will have a follow up liver ultrasound tomorrow.      DIABETES HISTORY  (-) h/o retinopathy.      (-) h/o microalbuminuria/nephropathy.  (-) h/o neuropathy.  (-) h/o Autonomic dysfunction  (-) h/o Gastroparesis  (-) h/o CAD  (-) h/o PVD     Last eye exam: 04/2015, f/u 1 year   Last urine microalbumin: 08/2015, 1283  Pneumovax: 12/2014  (+) ASA  (+) Statin  (+) ACE/ARB             Recommendations:  (E11.29) Type 2 diabetes mellitus with other diabetic kidney complication  (primary encounter diagnosis)  - No changes made to medications today   - Discussed dietary modifications for improved glycemic control   - Encouraged patient to continue blood glucose monitoring to at least 1x/day, goal to check 2x/day  - Last LDL at goal, continue statin, repeat fasting lipid panel due 08/2016  - Blood pressure at goal, continue amlodipine, cozaar    - Last microalbumin:creatinine ratio elevated, continue cozaar  - Up to date on screening eye exam, next due 04/2016, f/u  with ophthalmology as scheduled  - Check A1c prior to follow up appointment     Approximately 30 minutes were spent with patient, greater than 50% of which was spent counseling the patient on diabetes and on coordination of care.     Follow up in 3 months    If you have any questions, please do not hesitate to contact me at [phone].  Thank you for allowing me to participate in the care of this patient.    EDUCATION:  I educated/instructed the patient or caregiver regarding all aspects of the above stated plan of care.  The patient or caregiver indicated understanding.      [place] interpreter was not used.    Report electronically signed by:  Sunday Spillers, M.D.  Clinical Assistant Professor  Department of Endocrinology  Pager # [phone]

## 2015-10-31 ENCOUNTER — Ambulatory Visit
Admission: RE | Admit: 2015-10-31 | Discharge: 2015-10-31 | Disposition: A | Discharge status: Home / Self Care | Payer: Medicare Other | Source: Ambulatory Visit | Attending: Body Imaging | Admitting: Body Imaging

## 2015-10-31 DIAGNOSIS — K760 Fatty (change of) liver, not elsewhere classified: Secondary | ICD-10-CM | POA: Insufficient documentation

## 2015-10-31 DIAGNOSIS — K740 Hepatic fibrosis: Secondary | ICD-10-CM

## 2015-11-01 ENCOUNTER — Telehealth: Payer: Self-pay | Admitting: Family Medicine

## 2015-11-01 DIAGNOSIS — K760 Fatty (change of) liver, not elsewhere classified: Secondary | ICD-10-CM

## 2015-11-01 NOTE — Telephone Encounter (Signed)
Patient unable to schedule with Hepatology without new referral, her previous referral closed in April.  Patient said they are booking into end of September unless she has a urgent need.

## 2015-11-02 NOTE — Telephone Encounter (Signed)
3 patient identifiers verified by the patient  Reviewed results and message from MD with patient. Questions were answered and patient has no further questions or concerns at this time. Advised to please callback to the advice line at any time if questions arise.    Paula Daugherty  PCN Triage Nurse

## 2015-11-02 NOTE — Telephone Encounter (Signed)
Forwarded to [facility] for review

## 2015-11-02 NOTE — Telephone Encounter (Signed)
Left message to please call the office and ask to speak with the advice nurse so we can review MD message with her.    Francee Nodal Wonda Cerise, RN  PCN Triage

## 2015-11-02 NOTE — Telephone Encounter (Signed)
September appt should work fine and I will place an order as soon as I get to a computer; thanks

## 2015-11-02 NOTE — Addendum Note (Signed)
Addended by: [name] on: 11/02/2015 06:47 PM     Modules accepted: Orders

## 2015-11-02 NOTE — Telephone Encounter (Signed)
Referral to hepatology placed 11/02/2015.  Please ensure appointment is made--ok if in Sept.

## 2015-11-05 NOTE — Telephone Encounter (Signed)
3 patient identifiers used  Reviewed results and message from MD with patient. Questions were answered and patient has no further questions or concerns at this time. Advised to please callback to the advice line at any time if questions arise.  Kelven Flater, RN  PCN Triage

## 2015-11-21 ENCOUNTER — Other Ambulatory Visit: Payer: Self-pay | Admitting: Family Medicine

## 2015-11-21 NOTE — Telephone Encounter (Signed)
Last visit with PCP was on 09/12/2015.Dickie La 1

## 2015-12-11 ENCOUNTER — Ambulatory Visit: Payer: Medicare Other | Attending: "Endocrinology

## 2015-12-11 DIAGNOSIS — E119 Type 2 diabetes mellitus without complications: Secondary | ICD-10-CM

## 2015-12-11 DIAGNOSIS — E1121 Type 2 diabetes mellitus with diabetic nephropathy: Secondary | ICD-10-CM | POA: Insufficient documentation

## 2015-12-11 DIAGNOSIS — K760 Fatty (change of) liver, not elsewhere classified: Secondary | ICD-10-CM

## 2015-12-11 LAB — HEMOGLOBIN A1C
Hgb A1C,Glucose Est Avg: 146 mg/dL
Hgb A1C: 6.7 % — ABNORMAL HIGH (ref 3.9–5.6)

## 2015-12-11 LAB — URINALYSIS AND CULTURE IF IND
Bilirubin Urine: NEGATIVE
Glucose Urine: NEGATIVE mg/dL
Nitrite Urine: NEGATIVE
Occult Blood Urine: NEGATIVE
Specific Gravity, Urine: 1.024 (ref 1.002–1.030)
Urobilinogen: NEGATIVE mg/dL (ref ?–2.0)
pH URINE: 5.5 (ref 4.8–7.8)

## 2015-12-11 LAB — HEPATIC FUNCTION PANEL
Alanine Transferase (ALT): 45 U/L (ref 5–54)
Albumin: 4.2 g/dL (ref 3.2–4.6)
Alkaline Phosphatase (ALP): 81 U/L (ref 35–115)
Aspartate Transaminase (AST): 39 U/L (ref 15–43)
Bilirubin Direct: 0.1 mg/dL (ref 0.0–0.2)
Bilirubin Total: 0.9 mg/dL (ref 0.3–1.3)
Protein: 7.9 g/dL (ref 6.3–8.3)

## 2015-12-11 LAB — THYROID STIMULATING HORMONE: Thyroid Stimulating Hormone: 2.26 u[IU]/mL (ref 0.35–3.30)

## 2015-12-11 LAB — THYROXINE, FREE (FREE T4): Thyroxine, Free (Free T4): 0.79 ng/dL (ref 0.56–1.64)

## 2015-12-12 ENCOUNTER — Ambulatory Visit: Payer: Medicare Other | Admitting: Family Medicine

## 2015-12-12 ENCOUNTER — Encounter: Payer: Self-pay | Admitting: Family Medicine

## 2015-12-12 VITALS — BP 112/64 | HR 70 | Temp 97.4°F | Wt 176.1 lb

## 2015-12-12 DIAGNOSIS — E119 Type 2 diabetes mellitus without complications: Secondary | ICD-10-CM

## 2015-12-12 DIAGNOSIS — K7400 Hepatic fibrosis, unspecified: Secondary | ICD-10-CM

## 2015-12-12 DIAGNOSIS — R1013 Epigastric pain: Secondary | ICD-10-CM

## 2015-12-12 MED ORDER — LEVOTHYROXINE 50 MCG TABLET
50.0000 ug | ORAL_TABLET | Freq: Every day | ORAL | 2 refills | Status: DC
Start: 2015-12-12 — End: 2015-12-12

## 2015-12-12 NOTE — Nursing Note (Signed)
Vital signs taken, allergies and pharmacy verified. Screened for pain. Med list given to patient for review.

## 2015-12-12 NOTE — Patient Instructions (Addendum)
A laboratory test has been ordered for you.     An X-ray has been ordered for you. Please go to the front desk at [address] [place] to obtain your x-ray. You will receive a letter or telephone call with your results.     A CT scan has been ordered for you.  The referral has been sent to our referral coordinators who will work with your insurance company to obtain an approval for this test. You will be notified by a postcard in the mail or a telephone call when the referral has been approved. Non-urgent referrals may take up to 2 weeks for approval. Once the referral has been approved, please call [phone] option 3 to schedule an appointment in [place]. You will receive a letter or telephone call with your results.       You have been referred to a specialist. We are pleased to offer the services of [facility] specialists located at the [facility] or [place] sites. Please allow 7 business days (3 if urgent) for the referral to be processed. After that time you may call: Gastroenterology: [address], [address], [phone]

## 2015-12-12 NOTE — Progress Notes (Signed)
Paula Daugherty is a 65yrfemale who presents with a chief complaint of "I have list of things"    1. Abdominal pain: x 3 mos that seems generalized at times but primarily seems epigastric and intermittent in the form of aches with associated vomiting; possible constipation but no melena or bright red blood per rectum; no blood with vomiting that may seem green and sometimes frothy; not seeming to be better or worse with foods in general without without food; not aware of any association with fatty foods; no current symptoms; last episode 4 days ago   2. DM2: patient is pleased with control; patient confirms regular eye exams  3. Liver fibrosis: per ultrasound; patient to see hepatologist       ROS:   .Constitutional: no fever/chills.  CV: no chest pain, palpitations, shortness of breath or edema  Psych: having good days and days that may seem more challenging; mother seems to trigger some of these     Past Medical History:   Diagnosis Date    Asthma     Diabetes mellitus     Hypertension        Current Outpatient Prescriptions on File Prior to Visit   Medication Sig Dispense Refill    Amlodipine (NORVASC) 10 mg Tablet **12/08**TAKE 1 TABLET BY MOUTH EVERY DAY. 90 tablet 1    Aspirin 81 mg DR Tablet EC Tablet Take 1 tablet by mouth every day. 30 tablet 5    Atorvastatin (LIPITOR) 10 mg Tablet Take 1 tablet by mouth every day at bedtime. 90 tablet 3    Fluorouracil (EFUDEX) 5 % Cream Apply to the affected area 2 times daily. Apply to lesions as directed on nose twice daily for 2-3 weeks 40 g 0    FLUTICASONE/SALMETEROL (ADVAIR DISKUS INHA) Take  by inhalation.      GlipiZIDE (GLUCOTROL XL) 10 mg SR Tablet Take 1 tablet by mouth every morning with a meal. (diabetes) 30 tablet 5    Losartan (COZAAR) 25 mg Tablet Take 1 tablet by mouth every day. 90 tablet 3    Metformin (GLUCOPHAGE) 1,000 mg tablet Take 1 tablet by mouth 2 times daily with meals. (diabetes) 180 tablet 3    Omeprazole (PRILOSEC) 20 mg Delayed  Release Capsule TAKE ONE CAPSULE BY MOUTH EVERY DAY 90 capsule 0    ONETOUCH ULTRA TEST Strips USE FOR TESTING BLOOD GLUCOSE 3 TIMES PER DAY 300 strip 11    Oxybutynin (DITROPAN) 5 mg Tablet Take 5 mg by mouth 3 times daily.      Spironolactone (ALDACTONE) 100 mg Tablet Take 1 tablet by mouth every day. 30 tablet 11    Venlafaxine ([name]) 75 mg Tablet Take 2 tablets by mouth every morning. 60 tablet 2     No current facility-administered medications on file prior to visit.      Allergies:   Allergies   Allergen Reactions    Contrast Dye [Radiopaque Agent] Hives    Hydrochlorothiazide Other-Reaction in Comments     Patient reports    Januvia [Sitagliptin] Other-Reaction in Comments     Patient reports    Lyrica [Pregabalin] Other-Reaction in Comments     Patient reports    Omnipaque [Iohexol] Other-Reaction in Comments     Patient reports    Prozac [Fluoxetine Hcl] Other-Reaction in Comments     Patient reports    Sulfa (Sulfonamide Antibiotics) Rash    Topiramate Other-Reaction in Comments     Hairfall  Social History     Social History    Marital status: MARRIED     Spouse name: N/A    Number of children: 1    Years of education: N/A     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       Social History Main Topics    Smoking status: Former Smoker     Packs/day: 0.10     Years: 20.00    Smokeless tobacco: Never Used      Comment: quit 30 years ago    Alcohol use 0.0 oz/week     0 Standard drinks or equivalent per week      Comment: rare shares a beer or glass wine every 2-3 months     Drug use: No    Sexual activity: Yes     Partners: Male     Other Topics Concern    None     Social History Narrative     Family History   Problem Relation Age of Onset    Diabetes Father     Heart Mother      MI at 25s     Non-contributory Brother     Non-contributory Brother        PE:  BP 112/64  Pulse 70  Temp 36.3 C (97.4 F) (Temporal)  Wt 79.9 kg (176 lb 2.4 oz)  LMP 05/06/1983   BMI 32.22 kg/m2  .General Appearance: healthy, alert, no distress, pleasant affect, cooperative.  Eyes:  No icterus.  Heart:  normal rate and regular rhythm, no murmurs, clicks, or gallops.  Lungs: clear to auscultation.  Abdomen: BS normal.  Abdomen soft, non-tender.  No masses or organomegaly.  Extremities:  no cyanosis, clubbing, or edema.  Mental Status: appropriate affect, behavior and mentation     Assessment and Plan:  (R10.13) Epigastric pain  (primary encounter diagnosis)  Comment: new x 3 mos; unclear diagnosis or etiology; possible DDx reviewed with patient and further workup is needed   Plan: ABDOMEN 1 VIEW, HELICOBACTER PYLORI ANTIGEN,         CBC WITH DIFFERENTIAL, TISSUE TRANSGLUTAMINASE         AB,IGG, GASTROENTEROLOGY REFERRAL, LIPASE,         BASIC METABOLIC PANEL, CT ABDOMEN + PELVIS WITH        CONTRAST    (E11.9) Type 2 diabetes mellitus without complication, without long-term current use of insulin  Comment: at goal  Plan: FERRITIN, PNEUMOCOCCAL CONJ VACCINE 13 VALENT         IM          (K74.0) Liver fibrosis  Comment: per ultrasound   Plan: patient to see hepatologist       Total encounter time including history, physical examination, and coordination of care was approximately 25 minutes of which more than 50% was spent counseling regarding assessment/diagnosis and treatment plan. No guarantees were made regarding her medical care or treatment outcome. Barriers to Learning: none.  Patient verbalizes understanding of teaching and instructions.    Electronically signed by:    [name], MD  [place], [place] Board of Family Medicine  Associate physician [facility], [place]   [phone]

## 2015-12-13 ENCOUNTER — Encounter: Payer: Self-pay | Admitting: Family Medicine

## 2015-12-13 ENCOUNTER — Encounter: Payer: Self-pay | Admitting: "Endocrinology

## 2015-12-13 LAB — CULTURE URINE, BACTI: URINE CULTURE: >100000

## 2015-12-13 NOTE — Telephone Encounter (Signed)
From: [name]  To: [name], MD  Sent: 12/12/2015 6:31 PM PDT  Subject: Referral Question    CT scheduled for Aug 25th. They said you would have to do the pre med order. Hope you know what they mean? Concern about possible reaction due to IVP dye allergy. BTW DJ's copay is $182.00  for that new med.

## 2015-12-14 ENCOUNTER — Encounter: Payer: Self-pay | Admitting: GASTROENTEROLOGY

## 2015-12-14 ENCOUNTER — Ambulatory Visit: Payer: Medicare Other | Admitting: GASTROENTEROLOGY

## 2015-12-14 VITALS — BP 116/60 | HR 94 | Temp 97.2°F | Resp 16 | Ht 62.0 in | Wt 177.0 lb

## 2015-12-14 DIAGNOSIS — R112 Nausea with vomiting, unspecified: Secondary | ICD-10-CM

## 2015-12-14 DIAGNOSIS — K7581 Nonalcoholic steatohepatitis (NASH): Secondary | ICD-10-CM

## 2015-12-14 NOTE — Nursing Note (Signed)
Vital signs taken, allergies verified, screened for pain, med hx taken,  screened for chicken pox, and verified immunization status.  Akif Weldy Dovganyuk Jr. M.A

## 2015-12-14 NOTE — Progress Notes (Signed)
Hepatology Outpatient Initial History and Physical    Patient Name: Paula Daugherty  MRN: [id number]  Date of Service: 12/14/2015  Referring Provider: [name], MD  Attending Physician: Georgeanne Nim MD, MPH    REASON FOR CONSULTATION: Fatty liver     HISTORY OF PRESENT ILLNESS:  Paula Daugherty is a 70yrold female with history of DM and obesity BMI 32.  She has fatty liver seen on [place] which showed fat in her liver without splenomegaly and a liver fibrosis score of 12.9 (F3) suggestive of severe fibrosis.  She is asymptomatic at this time.  She is trying to watch her diet and does not exercise.  ALT level is 45.    Of note she has also had intermittent nausea and vomiting without weight loss over the past 2 years.  She thinks it may be related to stress, but she has never had an EGD.  Colonoscopy was done in 2015 which showed one tubular adenoma.  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    She rare drinks alcohol.    PAST MEDICAL HISTORY:  Past Medical History:   Diagnosis Date    Asthma     Diabetes mellitus     Hypertension        ALLERGIES:    Contrast Dye [Radiopaque Agent]    Hives  Hydrochlorothiazide    Other-Reaction in Comments    Comment:Patient reports  Januvia [Sitagliptin]    Other-Reaction in Comments    Comment:Patient reports  Lyrica [Pregabalin]    Other-Reaction in Comments    Comment:Patient reports  Omnipaque [Iohexol]    Other-Reaction in Comments    Comment:Patient reports  Prozac [Fluoxetine Hcl]    Other-Reaction in Comments    Comment:Patient reports  Sulfa (Sulfonamide Antibiotics)    Rash  Topiramate    Other-Reaction in Comments    Comment:Hairfall    MEDICATIONS:  Current Outpatient Prescriptions on File Prior to Visit   Medication Sig Dispense Refill    Amlodipine (NORVASC) 10 mg Tablet **12/08**TAKE 1 TABLET BY MOUTH EVERY DAY. 90 tablet 1    Aspirin 81 mg DR Tablet EC Tablet Take 1 tablet by mouth every day. 30 tablet 5    Atorvastatin  (LIPITOR) 10 mg Tablet Take 1 tablet by mouth every day at bedtime. 90 tablet 3    Fluorouracil (EFUDEX) 5 % Cream Apply to the affected area 2 times daily. Apply to lesions as directed on nose twice daily for 2-3 weeks 40 g 0    FLUTICASONE/SALMETEROL (ADVAIR DISKUS INHA) Take  by inhalation.      GlipiZIDE (GLUCOTROL XL) 10 mg SR Tablet Take 1 tablet by mouth every morning with a meal. (diabetes) 30 tablet 5    Losartan (COZAAR) 25 mg Tablet Take 1 tablet by mouth every day. 90 tablet 3    Metformin (GLUCOPHAGE) 1,000 mg tablet Take 1 tablet by mouth 2 times daily with meals. (diabetes) 180 tablet 3    Omeprazole (PRILOSEC) 20 mg Delayed Release Capsule TAKE ONE CAPSULE BY MOUTH EVERY DAY 90 capsule 0    ONETOUCH ULTRA TEST Strips USE FOR TESTING BLOOD GLUCOSE 3 TIMES PER DAY 300 strip 11    Oxybutynin (DITROPAN) 5 mg Tablet Take 5 mg by mouth 3 times daily.      Spironolactone (ALDACTONE) 100 mg Tablet Take 1 tablet by mouth every day. 30 tablet 11    Venlafaxine ([name]) 75 mg Tablet Take 2 tablets by  mouth every morning. 60 tablet 2     No current facility-administered medications on file prior to visit.        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  Eyes: negative.  Ears, Nose, Mouth, Throat: negative.  CV: negative.  Resp: negative.  GI: vomiting, nausea.  GU: negative.  Musculoskeletal: negative.  Integumentary: negative.  Neuro: negative.  Psych: Mood pt's report, euthymic.  Endo: negative.  Heme/Lymphatic: negative.  Allergy/Immun: negative.     PHYSICAL EXAMINATION:  Temp: 36.2 C (97.2 F) (08/11 1302)  Temp src: Temporal (08/11 1302)  Pulse: 94 (08/11 1302)  BP: 116/60 (08/11 1302)  Resp: 16 (08/11 1302)  SpO2: 96 % (08/11 1302)  Height: 157.5 cm (5' 2" ) (08/11 1302)  Weight: 80.3 kg (177 lb 0.5 oz) (08/11 1302)    GENERAL: well appearing, well nourished.  HEENT: Head is normocephalic and atraumatic, EOMI.  NECK: Supple, no lymphadenopathy.  LUNGS: Clear to auscultation.  HEART: Regular rate and rhythm without murmur.  ABDOMEN: Soft, nontender, and nondistended.  Normal bowel sounds.  No hepatosplenomegaly was noted.  EXTREMITIES: Without any cyanosis, clubbing, or edema.  NEUROLOGIC: Alert and oriented x4, no asterixis  PSYCHIATRIC: Normal mood and affect.  SKIN: No rashes seen.    LABORATORY EXAMINATIONS:   Lab Results   Lab Name Value Date/Time    NA 142 08/30/2015 08:35 AM    K 4.0 08/30/2015 08:35 AM    CL 104 08/30/2015 08:35 AM    CO2 22 (L) 08/30/2015 08:35 AM    BUN 19 08/30/2015 08:35 AM    CR 0.88 08/30/2015 08:35 AM    GLU 139 (H) 08/30/2015 08:35 AM     Lab Results   Lab Name Value Date/Time    WBC 8.9 08/30/2015 08:35 AM    HGB 14.4 08/30/2015 08:35 AM    HCT 44.6 08/30/2015 08:35 AM    PLT 217 08/30/2015 08:35 AM     Lab Results   Lab Name Value Date/Time    AST 39 12/11/2015 08:49 AM    AST 71 (H) 08/30/2015 08:35 AM    ALT 45 12/11/2015 08:49 AM    ALT 69 (  H) 08/30/2015 08:35 AM    ALP 81 12/11/2015 08:49 AM    ALP 104 08/30/2015 08:35 AM    ALB 4.2 12/11/2015 08:49 AM    ALB 4.2 08/30/2015 08:35 AM    TP 7.9 12/11/2015 08:49 AM    TP 8.5 (H) 08/30/2015  08:35 AM    TBIL 0.9 12/11/2015 08:49 AM    TBIL 0.9 08/30/2015 08:35 AM     Lab Results   Component Value Date    INR 1.02 12/08/2013     Lab Results   Component Value Date    VITD25 36.7 08/30/2015       VIRAL SEROLOGIES:  No results found for this or any previous visit.  Lab Results   Component Value Date    HBSAG Nonreactive 12/08/2013     Lab Results   Component Value Date    HEPBABQT 153.46 12/08/2013     No results found for: ANTIHBCT  No results found for this or any previous visit.  No results found for this or any previous visit.  No results found for this or any previous visit.    Lab Results   Component Value Date    HEPCABSCR Nonreactive 12/08/2013     No results found for this or any previous visit.  No results found for this or any previous visit.  No results found for: HIV1AND2SCR    AUTOIMMUNE:  No results found for: ANASCREEN  No results found for: IGG  No results found for this or any previous visit.  No results found for this or any previous visit.    OTHER:  No results found for: FRTN  No results found for this or any previous visit.  No results found for this or any previous visit.    STUDIES:  Colonoscopy 2015  DIAGNOSIS                  A.  CECUM, POLYP (SNARE POLYPECTOMY):       -  Fragments of tubular adenoma (see Comment)       -  No high-grade dysplasia or malignancy        B.  RECTUM, ULCER (BIOPSY):       -  Colonic mucosa with focal erosion and reactive epithelial changes       -  No dysplasia or malignancy       -  Deeper levels examined        Assessment and Plan:  1. Fatty liver disease: based on Fibroscan, she has F3 disease however, her other labs are not suggestive of severe fibrosis.  ALT has been persistently normal and she has normal synthetic function.  I would recommend a liver biopsy at this time, but we will wait until after the EGD to order this.  If esophageal varices are seen (which I doubt), she likely has cirrhosis and  no biopsy would be needed.  She was encouraged to lose weight 1-2 lbs per week.  2. N/V: unclear etiology.  She has no anemia or weight loss, but given the length of time this has been occurring, I will order and EGD.         Orders Placed This Encounter    INR       Return if symptoms worsen or fail to improve.    Georgeanne Nim MD, MPH  Assistant Professor of Internal Medicine  Division of Gastroenterology and Hepatology  [address], [facility] [address], [place] [address]  Tel: [phone]  Fax: [phone]  [email or web address]

## 2015-12-25 ENCOUNTER — Telehealth: Payer: Self-pay | Admitting: Family Medicine

## 2015-12-25 MED ORDER — PREDNISONE 50 MG TABLET
ORAL_TABLET | ORAL | 0 refills | Status: DC
Start: 2015-12-25 — End: 2020-08-14

## 2015-12-25 NOTE — Telephone Encounter (Signed)
Patient scheduled for Cat Scan on Friday, radiology told her to get pre-medicated and would like her to take Prednisone and Benadryl due to a severe allergic reaction she had 40 yrs ago. They are concerned.   IVP dye caused severe swelling and throat closed up, eyes swollen shot, had to receive IV Benadryl.  Please advise and call patient at number above, would like this done today since she is coming to [facility].

## 2015-12-25 NOTE — Telephone Encounter (Signed)
Per radiologist the following is what they recommend: 1 tab 13 hrs prior to CT; 1 tab 7 hrs prior to CT, 1 tab 1 hr prior to CT along with over the counter 25 mg of Benadryl.   Prescription sent 12/25/2015.  Thanks.

## 2015-12-25 NOTE — Telephone Encounter (Signed)
3 patient identifiers used  Reviewed  message from MD with patient. Questions were answered and patient has no further questions or concerns at this time. Advised to please callback to the advice line at any time if questions arise

## 2015-12-28 ENCOUNTER — Ambulatory Visit
Admission: RE | Admit: 2015-12-28 | Discharge: 2015-12-28 | Disposition: A | Discharge status: Home / Self Care | Payer: Medicare Other | Source: Ambulatory Visit | Attending: Family Medicine | Admitting: Family Medicine

## 2015-12-28 ENCOUNTER — Ambulatory Visit (HOSPITAL_BASED_OUTPATIENT_CLINIC_OR_DEPARTMENT_OTHER)
Admission: RE | Admit: 2015-12-28 | Discharge: 2015-12-28 | Disposition: A | Discharge status: Home / Self Care | Payer: Medicare Other | Source: Ambulatory Visit | Attending: Family Medicine | Admitting: Family Medicine

## 2015-12-28 DIAGNOSIS — R1013 Epigastric pain: Secondary | ICD-10-CM

## 2015-12-28 DIAGNOSIS — E279 Disorder of adrenal gland, unspecified: Secondary | ICD-10-CM

## 2015-12-28 MED ORDER — IOHEXOL 350 MG IODINE/ML INTRAVENOUS SOLUTION
125.0000 mL | INTRAVENOUS | Status: AC
Start: 2015-12-28 — End: 2015-12-28
  Administered 2015-12-28: 125 mL via INTRAVENOUS

## 2015-12-29 ENCOUNTER — Encounter: Payer: Self-pay | Admitting: Family Medicine

## 2015-12-29 DIAGNOSIS — E279 Disorder of adrenal gland, unspecified: Secondary | ICD-10-CM

## 2015-12-29 DIAGNOSIS — E278 Other specified disorders of adrenal gland: Secondary | ICD-10-CM | POA: Insufficient documentation

## 2015-12-30 ENCOUNTER — Other Ambulatory Visit: Payer: Self-pay | Admitting: "Endocrinology

## 2015-12-30 DIAGNOSIS — E279 Disorder of adrenal gland, unspecified: Principal | ICD-10-CM

## 2015-12-30 DIAGNOSIS — E278 Other specified disorders of adrenal gland: Secondary | ICD-10-CM

## 2015-12-31 ENCOUNTER — Encounter: Payer: Self-pay | Admitting: Family Medicine

## 2016-01-02 ENCOUNTER — Encounter: Payer: Self-pay | Admitting: Family Medicine

## 2016-01-02 NOTE — Telephone Encounter (Signed)
From: [name]  To: [name], MD  Sent: 01/01/2016 6:09 PM PDT  Subject: Non-urgent Medical Advice Question    Dr I think i need to go back to [place] to help my Mom for a couple of weeks. Do you see and issues with my traveling? I would leave Sept 20th and return Oct 4th. I don't have any appointments during that time. Thanks

## 2016-01-08 ENCOUNTER — Ambulatory Visit: Payer: Medicare Other | Attending: Family Medicine

## 2016-01-08 DIAGNOSIS — E278 Other specified disorders of adrenal gland: Secondary | ICD-10-CM

## 2016-01-08 DIAGNOSIS — E119 Type 2 diabetes mellitus without complications: Secondary | ICD-10-CM

## 2016-01-08 DIAGNOSIS — K7581 Nonalcoholic steatohepatitis (NASH): Secondary | ICD-10-CM

## 2016-01-08 DIAGNOSIS — R1013 Epigastric pain: Secondary | ICD-10-CM | POA: Insufficient documentation

## 2016-01-08 DIAGNOSIS — E279 Disorder of adrenal gland, unspecified: Secondary | ICD-10-CM | POA: Insufficient documentation

## 2016-01-08 LAB — LIPASE: Lipase: 91 U/L (ref 13–51)

## 2016-01-08 LAB — BASIC METABOLIC PANEL
Calcium: 9.8 mg/dL (ref 8.6–10.5)
Carbon Dioxide Total: 25 mmol/L (ref 24–32)
Chloride: 102 mmol/L (ref 95–110)
Creatinine Serum: 1.37 mg/dL — ABNORMAL HIGH (ref 0.44–1.27)
E-GFR Creatinine (Male): 40 mL/min/{1.73_m2}
E-GFR, African American (Male): 46 mL/min/{1.73_m2}
Glucose: 153 mg/dL — ABNORMAL HIGH (ref 70–99)
Potassium: 5.6 mmol/L — ABNORMAL HIGH (ref 3.3–5.0)
Sodium: 139 mmol/L (ref 135–145)
Urea Nitrogen, Blood (BUN): 26 mg/dL — ABNORMAL HIGH (ref 8–22)

## 2016-01-08 LAB — CBC WITH DIFFERENTIAL
Basophils % Auto: 0.8 %
Basophils Abs Auto: 0.1 K/MM3 (ref 0.0–0.2)
Eosinophils % Auto: 1.8 %
Eosinophils Abs Auto: 0.2 K/MM3 (ref 0.0–0.5)
Hematocrit: 39.6 % (ref 36.0–46.0)
Hemoglobin: 13.3 g/dL (ref 12.0–16.0)
Lymphocytes % Auto: 27.4 %
Lymphocytes Abs Auto: 2.9 K/MM3 (ref 1.0–4.8)
MCH: 29.3 pg (ref 27.0–33.0)
MCHC: 33.5 % (ref 32.0–36.0)
MCV: 87.7 UM3 (ref 80.0–100.0)
MPV: 8 UM3 (ref 6.8–10.0)
Monocytes % Auto: 5.5 %
Monocytes Abs Auto: 0.6 K/MM3 (ref 0.1–0.8)
Neutrophils % Auto: 64.5 %
Neutrophils Abs Auto: 6.9 K/MM3 (ref 1.8–7.7)
Platelet Count: 222 K/MM3 (ref 130–400)
RDW: 15.3 U — ABNORMAL HIGH (ref 0.0–14.7)
Red Blood Cell Count: 4.52 M/MM3 (ref 4.00–5.20)
White Blood Cell Count: 10.7 K/MM3 (ref 4.5–11.0)

## 2016-01-08 LAB — INR: INR: 1 (ref 0.87–1.18)

## 2016-01-08 LAB — FERRITIN: Ferritin: 84 ng/mL (ref 10–291)

## 2016-01-09 ENCOUNTER — Telehealth: Payer: Self-pay | Admitting: Family Medicine

## 2016-01-09 ENCOUNTER — Ambulatory Visit: Payer: Medicare Other | Attending: Family Medicine

## 2016-01-09 DIAGNOSIS — E875 Hyperkalemia: Secondary | ICD-10-CM | POA: Insufficient documentation

## 2016-01-09 DIAGNOSIS — R7989 Other specified abnormal findings of blood chemistry: Secondary | ICD-10-CM

## 2016-01-09 LAB — BASIC METABOLIC PANEL
Calcium: 9.3 mg/dL (ref 8.6–10.5)
Carbon Dioxide Total: 20 mmol/L — ABNORMAL LOW (ref 24–32)
Chloride: 104 mmol/L (ref 95–110)
Creatinine Serum: 1.52 mg/dL — ABNORMAL HIGH (ref 0.44–1.27)
E-GFR Creatinine (Male): 35 mL/min/{1.73_m2}
E-GFR, African American (Male): 41 mL/min/{1.73_m2}
Glucose: 316 mg/dL — ABNORMAL HIGH (ref 70–99)
Potassium: 5.1 mmol/L — ABNORMAL HIGH (ref 3.3–5.0)
Sodium: 136 mmol/L (ref 135–145)
Urea Nitrogen, Blood (BUN): 29 mg/dL — ABNORMAL HIGH (ref 8–22)

## 2016-01-09 LAB — CREATININE 24 HOUR URINE
COLLECTION INTERVAL, URINE: 24 h
Creatinine 24 Hr: 1109 mg/24Hr (ref 800–2000)
Creatinine Conc: 68.23 mg/dL
START TIME: 838
STOP TIME: 838
Urine Volume: 1625 mL

## 2016-01-09 LAB — TISSUE TRANSGLUTAMINASE AB,IGG
Tissue Transglutaminase Ab, IgG Value: <0.8 U/mL (ref <15.0)
Tissue Transglutaminase Ab, IgG: NEGATIVE

## 2016-01-09 NOTE — Telephone Encounter (Signed)
Patient was contacted via cell phone 01/09/2016.  Lab results were reviewed.   Potassium down to 5.1 from 5.6.  But kidney function may be showing signs of possible need to increase water and patient to address this and recheck labs within 24-36 hrs. Patient reports no changes in meds. Of note, patient had CT scan last week with contrast.

## 2016-01-09 NOTE — Telephone Encounter (Signed)
Elevated potassium; this could an error but repeat needed within 24 hrs as we seek evaluation for any possible arrhythmia concern.  Lab draw should be without fasting and without use of tourniquet and without fist clenching.  Please notify patient.  Thanks.

## 2016-01-09 NOTE — Addendum Note (Signed)
Addended by: [name] on: 01/09/2016 05:56 PM     Modules accepted: Orders

## 2016-01-09 NOTE — Telephone Encounter (Signed)
3 patient identifiers used  Reviewed results and message from MD with patient. Questions were answered and patient has no further questions or concerns at this time. Advised to please callback to the advice line at any time if questions arise.

## 2016-01-10 ENCOUNTER — Ambulatory Visit: Payer: Medicare Other | Attending: Family Medicine

## 2016-01-10 DIAGNOSIS — R7989 Other specified abnormal findings of blood chemistry: Secondary | ICD-10-CM | POA: Insufficient documentation

## 2016-01-10 LAB — ALDOSTERONE,BLOOD SENDOUT: Aldosterone,Blood: 38.3 ng/dL

## 2016-01-10 LAB — BASIC METABOLIC PANEL
Calcium: 9.8 mg/dL (ref 8.6–10.5)
Carbon Dioxide Total: 22 mmol/L — ABNORMAL LOW (ref 24–32)
Chloride: 105 mmol/L (ref 95–110)
Creatinine Serum: 1.47 mg/dL — ABNORMAL HIGH (ref 0.44–1.27)
E-GFR Creatinine (Male): 36 mL/min/{1.73_m2}
E-GFR, African American (Male): 42 mL/min/{1.73_m2}
Glucose: 165 mg/dL — ABNORMAL HIGH (ref 70–99)
Potassium: 5.1 mmol/L — ABNORMAL HIGH (ref 3.3–5.0)
Sodium: 138 mmol/L (ref 135–145)
Urea Nitrogen, Blood (BUN): 26 mg/dL — ABNORMAL HIGH (ref 8–22)

## 2016-01-10 LAB — RENIN ACTIVITY: Renin Activity: 6.6 ng/mL/h

## 2016-01-11 ENCOUNTER — Other Ambulatory Visit: Payer: Self-pay | Admitting: Family Medicine

## 2016-01-11 DIAGNOSIS — E875 Hyperkalemia: Secondary | ICD-10-CM

## 2016-01-11 DIAGNOSIS — R7989 Other specified abnormal findings of blood chemistry: Secondary | ICD-10-CM

## 2016-01-12 LAB — CATECHOLAMINES,URINE FREE SENDOUT
CREATININE,UR PER 24HR: 1008 mg/d (ref 500–1400)
CREATININE,UR PER VOLUME: 62 mg/dL
DOPAMINE,UR PER 24HR: 148 ug/d (ref 77–324)
DOPAMINE,UR PER VOLUME: 91 ug/L
DOPAMINE,UR RATIO TO CRT: 147 ug/g CRT (ref 0–250)
EPINEPHRINE,UR PER 24 HR: 2 ug/d (ref 1–7)
EPINEPHRINE,UR PER VOLUME: 1 ug/L
EPINEPHRINE,UR RATIO TO CRT: 2 ug/g CRT (ref 0–20)
NOREPINEPHRINE,UR PER 24HR: 70 ug/d (ref 16–71)
NOREPINEPHRINE,UR PER VOLUME: 43 ug/L
NOREPINEPHRINE,UR RATIO TO CRT: 69 ug/g CRT — ABNORMAL HIGH (ref 0–45)
TIME OF COLLECTION: 24 hr
TOTAL VOLUME: 1625 mL

## 2016-01-12 LAB — CORTISOL,URINE FREE SENDOUT
CORTISOL,UR FREE PER 24HR: 13.7 ug/d (ref <=45.0)
CORTISOL,UR FREE PER VOLUME: 8.46 ug/L
CORTISOL,UR FREE RATIO TO CRT: 13.65 ug/g CRT
CREATININE,UR PER 24HR: 1008 mg/d (ref 500–1400)
CREATININE,UR PER VOLUME: 62 mg/dL
TIME OF COLLECTION: 24 hr
TOTAL VOLUME: 1625 mL

## 2016-01-12 LAB — METANEPHRINES FRACTIONATED,UR SENDOUT
CREATININE,UR PER 24HR: 1008 mg/d (ref 500–1400)
CREATININE,UR PER VOLUME: 62 mg/dL
METANEPHRINE,UR-PER 24HR: 115 ug/d (ref 39–143)
METANEPHRINE,UR-PER VOLUME: 71 ug/L
METANEPHRINE,UR-RATIO TO CRT: 115 ug/g CRT (ref 0–300)
NORMETANEPHRIN,UR-RATIO TO CRT: 726 ug/g CRT — ABNORMAL HIGH (ref 0–400)
NORMETANEPHRINE,UR-PER 24HR: 731 ug/d — ABNORMAL HIGH (ref 109–393)
NORMETANEPHRINE,UR-PER VOLUME: 450 ug/L
TIME OF COLLECTION: 24 hr
TOTAL VOLUME: 1625 mL

## 2016-01-15 ENCOUNTER — Encounter: Payer: Self-pay | Admitting: "Endocrinology

## 2016-01-15 ENCOUNTER — Ambulatory Visit: Payer: Medicare Other | Attending: Family Medicine

## 2016-01-15 DIAGNOSIS — R7989 Other specified abnormal findings of blood chemistry: Secondary | ICD-10-CM | POA: Insufficient documentation

## 2016-01-15 DIAGNOSIS — E875 Hyperkalemia: Secondary | ICD-10-CM

## 2016-01-15 LAB — BASIC METABOLIC PANEL
Calcium: 9.6 mg/dL (ref 8.6–10.5)
Carbon Dioxide Total: 21 mmol/L — ABNORMAL LOW (ref 24–32)
Chloride: 105 mmol/L (ref 95–110)
Creatinine Serum: 1.33 mg/dL — ABNORMAL HIGH (ref 0.44–1.27)
E-GFR Creatinine (Male): 41 mL/min/{1.73_m2}
E-GFR, African American (Male): 47 mL/min/{1.73_m2}
Glucose: 120 mg/dL — ABNORMAL HIGH (ref 70–99)
Potassium: 5.4 mmol/L — ABNORMAL HIGH (ref 3.3–5.0)
Sodium: 138 mmol/L (ref 135–145)
Urea Nitrogen, Blood (BUN): 27 mg/dL — ABNORMAL HIGH (ref 8–22)

## 2016-01-15 NOTE — Telephone Encounter (Signed)
From: [name]  To: Charlane Ferretti, MD  Sent: 01/14/2016 6:42 PM PDT  Subject: Visit Follow-up Question    Dr I have seen my test results but don't know what they mean. Should I be concerned? What are next steps?

## 2016-01-16 ENCOUNTER — Telehealth: Payer: Self-pay | Admitting: Family Medicine

## 2016-01-16 DIAGNOSIS — E875 Hyperkalemia: Secondary | ICD-10-CM

## 2016-01-16 DIAGNOSIS — R7989 Other specified abnormal findings of blood chemistry: Secondary | ICD-10-CM

## 2016-01-16 NOTE — Telephone Encounter (Signed)
I spoke to patient by phone regarding kidney and potassium; she confirmed not taking ALdactone and will avoid as we recheck BMP; so far levels have not gotten any higher and will also avoid NSAIDs.

## 2016-01-17 ENCOUNTER — Encounter: Payer: Self-pay | Admitting: Family Medicine

## 2016-01-17 DIAGNOSIS — E278 Other specified disorders of adrenal gland: Secondary | ICD-10-CM

## 2016-01-17 DIAGNOSIS — E279 Disorder of adrenal gland, unspecified: Principal | ICD-10-CM

## 2016-01-17 NOTE — Telephone Encounter (Signed)
From: [name]  To: [name], MD  Sent: 01/16/2016 5:45 PM PDT  Subject: Non-urgent Medical Advice Question    Dr I forgot to ask you... My endoscopy is scheduled for Oct 10th at 9 am. They say I must ask you about taking my meds that day. They say if i dont they will not do procedure. Is it ok to wait and take them afterwards that day?

## 2016-01-17 NOTE — Telephone Encounter (Signed)
From: [name]  To: [name], MD  Sent: 01/16/2016 6:41 PM PDT  Subject: Non-urgent Medical Advice Question    Hello Dr. Just wanted to touch base. I am still taking my meds. I find that I am on a more even keel these and can tell the difference if I miss a dose. However I remain fatigued and completely exhausted. I hate having to get out of bed or leaving my house. I don't know how to fix it.

## 2016-01-19 MED ORDER — VENLAFAXINE 75 MG TABLET
75.0000 mg | ORAL_TABLET | Freq: Three times a day (TID) | ORAL | 2 refills | Status: DC
Start: 2016-01-19 — End: 2016-04-18

## 2016-01-19 NOTE — Telephone Encounter (Signed)
Neuro / Sleep Med Chart Update 01/19/16:  Pt improved on venlafaxine 75 mg twice daily. Still with some anhedonia. Increase to 75 mg three times daily.    Jenita Seashore, MD  Neuro / Sleep Med

## 2016-01-19 NOTE — Addendum Note (Signed)
Addended by: Jenita Seashore on: 01/19/2016 10:06 PM     Modules accepted: Orders

## 2016-01-21 ENCOUNTER — Ambulatory Visit: Payer: Medicare Other | Attending: Family Medicine

## 2016-01-21 DIAGNOSIS — E279 Disorder of adrenal gland, unspecified: Secondary | ICD-10-CM | POA: Insufficient documentation

## 2016-01-21 DIAGNOSIS — E278 Other specified disorders of adrenal gland: Secondary | ICD-10-CM

## 2016-01-23 LAB — METANEPHRINES,PLASMA (FREE) SENDOUT
METANEPHRINE: 0.12 nmol/L (ref 0.00–0.49)
NORMETANEPHRINE: 0.64 nmol/L (ref 0.00–0.89)

## 2016-01-24 ENCOUNTER — Other Ambulatory Visit: Payer: Self-pay | Admitting: Family Medicine

## 2016-01-24 NOTE — Telephone Encounter (Signed)
Patient was last seen 12/12/2015. Last filled 10/30/2015.   Paula Daugherty, [place]

## 2016-01-27 ENCOUNTER — Encounter: Payer: Self-pay | Admitting: "Endocrinology

## 2016-02-01 ENCOUNTER — Encounter: Payer: Self-pay | Admitting: "Endocrinology

## 2016-02-06 ENCOUNTER — Encounter: Payer: Self-pay | Admitting: Family Medicine

## 2016-02-06 NOTE — Telephone Encounter (Signed)
From: [name]  To: [name], MD  Sent: 02/06/2016 12:47 PM PDT  Subject: Non-urgent Medical Advice Question    Hi dr, my endoscopy is next week. I sent you a note on 9/13 asking about my meds for the day. The directions say I MUST contact you and get an answer or they will not do the procedure. Pleasr let me know. Thank you.

## 2016-02-07 ENCOUNTER — Telehealth: Payer: Self-pay

## 2016-02-07 NOTE — Telephone Encounter (Signed)
PRE-PROCEDURE CALL    Note started: 02/07/2016, 1:43 PM    Patient identifiers were verified. Patient confirmed that she is coming to her EGD appointment. Patient verbalized understanding of all prep instructions. Patient was instructed to call us if questions should arise at [phone], [phone] or [phone] Monday-Friday from [phone], and to call the Hospital Operator @ [phone] and ask for the on-call GI Fellow after office hours.

## 2016-02-08 ENCOUNTER — Ambulatory Visit: Payer: Medicare Other | Attending: Family Medicine

## 2016-02-08 DIAGNOSIS — R7989 Other specified abnormal findings of blood chemistry: Secondary | ICD-10-CM | POA: Insufficient documentation

## 2016-02-08 DIAGNOSIS — E875 Hyperkalemia: Secondary | ICD-10-CM | POA: Insufficient documentation

## 2016-02-08 LAB — BASIC METABOLIC PANEL
Calcium: 9.2 mg/dL (ref 8.6–10.5)
Carbon Dioxide Total: 26 mmol/L (ref 24–32)
Chloride: 103 mmol/L (ref 95–110)
Creatinine Serum: 1.06 mg/dL (ref 0.44–1.27)
E-GFR Creatinine (Male): 54 mL/min/{1.73_m2}
E-GFR, African American (Male): >60 mL/min/{1.73_m2}
Glucose: 151 mg/dL — ABNORMAL HIGH (ref 70–99)
Potassium: 3.9 mmol/L (ref 3.3–5.0)
Sodium: 140 mmol/L (ref 135–145)
Urea Nitrogen, Blood (BUN): 12 mg/dL (ref 8–22)

## 2016-02-12 ENCOUNTER — Encounter: Payer: Self-pay | Admitting: GASTROENTEROLOGY

## 2016-02-12 ENCOUNTER — Ambulatory Visit
Admission: RE | Admit: 2016-02-12 | Discharge: 2016-02-12 | Disposition: A | Discharge status: Home / Self Care | Payer: Medicare Other | Source: Ambulatory Visit | Attending: GASTROENTEROLOGY | Admitting: GASTROENTEROLOGY

## 2016-02-12 DIAGNOSIS — K760 Fatty (change of) liver, not elsewhere classified: Secondary | ICD-10-CM | POA: Insufficient documentation

## 2016-02-12 DIAGNOSIS — K317 Polyp of stomach and duodenum: Secondary | ICD-10-CM

## 2016-02-12 DIAGNOSIS — D3A010 Benign carcinoid tumor of the duodenum: Secondary | ICD-10-CM

## 2016-02-12 DIAGNOSIS — K7581 Nonalcoholic steatohepatitis (NASH): Secondary | ICD-10-CM | POA: Insufficient documentation

## 2016-02-12 DIAGNOSIS — K2960 Other gastritis without bleeding: Secondary | ICD-10-CM

## 2016-02-12 HISTORY — DX: Unspecified cirrhosis of liver: K74.60

## 2016-02-12 HISTORY — DX: Disorder of kidney and ureter, unspecified: N28.9

## 2016-02-12 HISTORY — PX: PR ESOPHAGOGASTRODUODENOSCOPY TRANSORAL DIAGNOSTIC: 43235

## 2016-02-12 MED ORDER — EPINEPHRINE 0.1 MG/ML INJECTION SYRINGE
1.0000 mg | INJECTION | INTRAMUSCULAR | Status: DC | PRN
Start: 2016-02-12 — End: 2016-02-18
  Filled 2016-02-12: qty 10

## 2016-02-12 MED ORDER — DIPHENHYDRAMINE 50 MG/ML INJECTION SOLUTION
12.5000 mg | INTRAMUSCULAR | Status: DC | PRN
Start: 2016-02-12 — End: 2016-02-18
  Filled 2016-02-12: qty 1

## 2016-02-12 MED ORDER — LIDOCAINE HCL 2 % MUCOSAL SOLUTION
5.0000 mL | Freq: Once | Status: AC
Start: 2016-02-12 — End: 2016-02-12
  Administered 2016-02-12: 5 mL via OROMUCOSAL
  Filled 2016-02-12: qty 15

## 2016-02-12 MED ORDER — SIMETHICONE 40 MG/0.6 ML ORAL DROPS,SUSPENSION
40.0000 mg | Freq: Once | ORAL | Status: AC
Start: 2016-02-12 — End: 2016-02-12
  Administered 2016-02-12: 40 mg
  Filled 2016-02-12: qty 0.6

## 2016-02-12 MED ORDER — NALOXONE 0.4 MG/ML INJECTION SOLUTION
0.4000 mg | INTRAMUSCULAR | Status: DC | PRN
Start: 2016-02-12 — End: 2016-02-18

## 2016-02-12 MED ORDER — EPINEPHRINE 0.1 MG/ML INJECTION SYRINGE
0.3000 mg | INJECTION | INTRAMUSCULAR | Status: DC | PRN
Start: 2016-02-12 — End: 2016-02-18

## 2016-02-12 MED ORDER — FLUMAZENIL 0.1 MG/ML INTRAVENOUS SOLUTION
0.2000 mg | INTRAVENOUS | Status: DC | PRN
Start: 2016-02-12 — End: 2016-02-18

## 2016-02-12 MED ORDER — PROMETHAZINE 50 MG/ML INJECTION SOLUTION
6.2500 mg | INTRAMUSCULAR | Status: DC | PRN
Start: 2016-02-12 — End: 2016-02-18

## 2016-02-12 MED ORDER — FENTANYL (PF) 50 MCG/ML INJECTION SOLUTION
12.5000 ug | INTRAMUSCULAR | Status: DC | PRN
Start: 2016-02-12 — End: 2016-02-18
  Administered 2016-02-12: 100 ug via INTRAVENOUS
  Filled 2016-02-12: qty 4

## 2016-02-12 MED ORDER — MIDAZOLAM (PF) 1 MG/ML INJECTION SOLUTION
0.5000 mg | INTRAMUSCULAR | Status: DC | PRN
Start: 2016-02-12 — End: 2016-02-18
  Administered 2016-02-12: 7 mg via INTRAVENOUS
  Filled 2016-02-12: qty 8

## 2016-02-12 MED ORDER — NACL 0.9% IV INFUSION
INTRAVENOUS | Status: DC
Start: 2016-02-12 — End: 2016-02-18
  Administered 2016-02-12 (×2): via INTRAVENOUS

## 2016-02-12 MED ORDER — ATROPINE 0.1 MG/ML INJECTION SYRINGE
0.5000 mg | INJECTION | INTRAMUSCULAR | Status: DC | PRN
Start: 2016-02-12 — End: 2016-02-18

## 2016-02-12 NOTE — Nurse Focus (Signed)
NURSING PRE-SEDATION ASSESSMENT    Paula Daugherty arrived by ambulating  into clinic today at 0900 from home.      Patient has arranged for a ride home from DJ who can be contacted at [phone]. Instructed patient that post sedation, they are not to drive or drink alcohol for the remainder of the day and patient verbalized understanding.      Identification band placed on and two forms of identification information verified: yes    Verified procedure with the patient. yes    NPO since: 2100    Pt completed bowel prep:  Not applicable      Past Surgical History:   Procedure Laterality Date    COLONOSCOPY  01/04/14    polyp--TA and erosion; hemorrhoids; tics; repeat x 3 yrs    MAMMOPLASTY, REDUCTION  1985    PHACOEMULSIFICATION, CATARACT Right 02/12/2015    with IOL    PR KNEE SCOPE,DIAGNOSTIC  1980s    Knee arthroscopy    PR LIGATE FALLOPIAN TUBE      Tubal ligation    PR REPAIR TYMPANIC MEMBRANE      Tympanoplasty    PR TOTAL ABDOM HYSTERECTOMY  1980s    partial; no BSO    REPAIR, BLEPHAROPTOSIS Bilateral 08/06/2015    Blepharoplasty bilateral upper lids       Social History   Substance Use Topics    Smoking status: Former Smoker     Packs/day: 0.10     Years: 20.00    Smokeless tobacco: Former Systems developer      Comment: quit 30 years ago    Alcohol use 0.0 oz/week     0 Standard drinks or equivalent per week      Comment: rare shares a beer or glass wine every 2-3 months        History   Drug Use No        Past anesthesia responses:  No problem.      Current Outpatient Prescriptions   Medication Sig    Amlodipine (NORVASC) 10 mg Tablet TAKE 1 TABLET BY MOUTH EVERY DAY.    Aspirin 81 mg DR Tablet EC Tablet Take 1 tablet by mouth every day.    Atorvastatin (LIPITOR) 10 mg Tablet Take 1 tablet by mouth every day at bedtime.    Fluorouracil (EFUDEX) 5 % Cream Apply to the affected area 2 times daily. Apply to lesions as directed on nose twice daily for 2-3 weeks    FLUTICASONE/SALMETEROL (ADVAIR DISKUS INHA) Take   by inhalation.    GlipiZIDE (GLUCOTROL XL) 10 mg SR Tablet Take 1 tablet by mouth every morning with a meal. (diabetes)    Losartan (COZAAR) 25 mg Tablet Take 1 tablet by mouth every day.    Metformin (GLUCOPHAGE) 1,000 mg tablet Take 1 tablet by mouth 2 times daily with meals. (diabetes)    Omeprazole (PRILOSEC) 20 mg Delayed Release Capsule TAKE ONE CAPSULE BY MOUTH EVERY DAY    ONETOUCH ULTRA TEST Strips USE FOR TESTING BLOOD GLUCOSE 3 TIMES PER DAY    Venlafaxine ([name]) 75 mg Tablet Take 1 tablet by mouth 3 times daily.     Current Facility-Administered Medications   Medication    Atropine Injection 0.5 mg    DiphenhydrAMINE (BENADRYL) Injection 12.5-25 mg    Epinephrine (ADRENALIN) 0.1 mg/mL Injection 0.3-1 mg    Epinephrine (ADRENALIN) 0.1 mg/mL Injection 0.3-1 mg    Epinephrine (ADRENALIN) 0.1 mg/mL Injection 1 mg    Fentanyl (SUBLIMAZE) Injection 12.5-75 mcg  Flumazenil (ROMAZICON) Injection 0.2 mg    Lidocaine (XYLOCAINE) 2 % Viscous Solution 5 mL    Midazolam (VERSED) Injection 0.5-3 mg    NaCl 0.9% Infusion    Naloxone (NARCAN) Injection 0.4 mg    Promethazine (PHENERGAN) Injection 6.5-25 mg    Simethicone (MYLICON) 40 DD/2.2 mL Oral Suspension 40-80 mg       Anticoagulants: Patient denies taking Aspirin, NSAIDs, or other anticoagulants for the past 5 days.    OTC/Herbal preparations: NONE    Reviewed health status with the patient in regards to allergies (including latex), medications, pregnancy, hypertension, atrial fibrilation, liver disease, bleeding disorders, diabetes,  CVA's, seizures, sleep apnea, glaucoma, cancer, prosthesis or implants, infectious diseases, surgical history; and disease of heart, lungs, liver, or kidneys. Positive history reviewed and updated in Health History section of the EMR.    Patient Active Problem List   Diagnosis    DM2 (diabetes mellitus, type 2)    Depression with anxiety    Stress at home    Leg cramps-right calf    Right ear pain     Fibromyalgia    HTN (hypertension)    GERD (gastroesophageal reflux disease)    Hyperlipidemia with target LDL less than 70    Vitamin D insufficiency    Abnormal LFTs    Renal cyst ( 6.4 cm cyst left kidney)    Cough    Type 2 diabetes mellitus without complication    Fatty liver    Macroalbuminuric diabetic nephropathy    History of actinic keratoses    Cataract    Ptosis of eyelid    Liver fibrosis    Adrenal nodule       Allergies   Allergen Reactions    Contrast Dye [Radiopaque Agent] Hives    Hydrochlorothiazide Other-Reaction in Comments     Patient reports    Januvia [Sitagliptin] Other-Reaction in Comments     Patient reports    Lyrica [Pregabalin] Other-Reaction in Comments     Patient reports    Omnipaque [Iohexol] Other-Reaction in Comments     Patient reports    Prozac [Fluoxetine Hcl] Other-Reaction in Comments     Patient reports    Sulfa (Sulfonamide Antibiotics) Rash    Topiramate Other-Reaction in Comments     Hairfall       Past Medical History:   Diagnosis Date    Asthma     Cirrhosis     Diabetes mellitus     Hypertension     Kidney disease        Nursing Assessment Data Base:  Ventilation/Respirations: normal, unlabored.  Circulation/Perfusion/Skin: warm,normal color,dry.  Cognition/Communication -- Behavior: alert and oriented,cooperative.  Gastro-Intestinal: no distress.  Abdomen: soft,non-tender.  Level of Ambulation: self.    Belongings are with patient in garment bag under gurney.  Patient has: Glasses.  Barriers to Learning assessed: none. Patient verbalizes understanding of teaching and instructions.    Nursing assessment completed by:  Evonnie Dawes, RN  02/12/2016 09:17

## 2016-02-12 NOTE — Procedures (Addendum)
Pre-Procedure Time Out Checklist  (do not remove)     Attestation:  ID verified by two sources (select any two from list): DOB and Name  Was this an emergency procedure?  no     I attest that I verified the following information prior to performing the procedure: Patient ID, Site and Procedure     Patient Name:    Paula Daugherty  MRN: [id number]   DOB: Aug 07, 1945  DOS: 02/12/2016    Procedure: Upper Endoscopy  Sub-procedure: Snare cautery polypectomy and Control of bleeding     Time start: 1119  Time end: 1156    Referring Physician: [name], MD    PERFORMING SURGEON: Glyn Ade, MD  ASSISTANT SURGEON: Elpidio Anis, MD    GI Pre-procedure Indications:  70 year old female with NAFLD and chronic intermittent nausea and vomiting.      Details of the Procedure:    Informed consent was obtained for the procedure, including sedation. Risks of perforation, hemorrhage, adverse drug reaction, pain, infection and aspiration were discussed. The patient was placed in the left lateral decubitus position. Based on the pre-procedure assessment, including review of the patient's medical history, medications, allergies, and review of systems, the patient had been deemed to be an appropriate candidate for conscious sedation; the patient was therefore sedated with the medications listed below. The patient was monitored continuously with EKG tracing, pulse oximetry, blood pressure monitoring, and direct observations.     Sedation:   Sedation was administered for the procedure/procedures  Versed 7 mg IV and Fentanyl 100 mcg IV   Viscous lidocaine (2%) 5 mL PO gargle     Findings and Interventions:   An oral examination was performed. The Olympus adult gastroscope was inserted into the mouth and advanced under direct vision to the third portion of the duodenum.  In the second part of the duodenum, a 1-1.5 cm polyp was seen.  This was removed by hot snare.  The specimen was too large to be suctioned into the adult gastroscope, and so the scope  was withdrawn.  A pediatric colonoscope was then introduced, and using a Jabier Mutton net the specimen was retrieved.  There was some oozing from the site of polypectomy and one hemoclip was placed with good hemostasis.  Appropriate photo documentation was obtained. The patient tolerated the procedure well, and there were no complications. Patient was taken to the recovery area in stable condition.  The rest of the exam is detailed below:    Esophagus: normal appearing.  No varices were seen.  Stomach: mild erythema in the antrum.  Otherwise normal appearing  Duodenum: polyp seen in the second part of the duodenum as detailed above.  Otherwise normal appearing mucosa from the duodenal bulb to the third part of the duodenum.    Retroflexed view: normal appearing mucosa    Impression:   1. 1-1.5 cm duodenal polyp, s/p Snare cautery and hemoclip x1.  2. Mild antral gastritis  3. No evidence of varices.    Recommendation/Plan:   Await pathology to r/o malignancy.    Elpidio Anis, MD (PGY 4  Clinical Fellow)  Department of Internal Medicine  Division of Gastroenterology & Hepatology  [facility]  Pager: [phone]  12:09 10/10/201    Attending attestation: I was present for the entire viewing portion of the procedure.  I agree with the findings and plan of care as documented in the fellow's note.    Georgeanne Nim MD, MPH  Assistant Professor of Internal Medicine  Division of Gastroenterology and Hepatology

## 2016-02-12 NOTE — Nurse Procedure (Signed)
NURSING PROCEDURE NOTE    Note started: 02/12/2016, 11:52    Procedure pause done & plan of sedation reviewed. Sedation administered as ordered. Bite block placed. S/P EGD. Duodenal polyp was removed (A). We used a pediatric scope to retrieve polyp. Oral secretions suctioned as needed the entire procedure. Patient tolerated procedure under moderate sedation. Refer to MD notes & nursing flowsheet for details. To RR with Imelda, RN whom I gave report to.    Leo Grosser, RN

## 2016-02-12 NOTE — Pre Sedation (Addendum)
Gastroenterology/hepatology Pre-procedure History & Physical   IMMEDIATE PRE-SEDATION ASSESSMENT        DOS: 02/12/16      Referring provider: [name], MD  Procedure: EGD     HPI: Paula Daugherty is a 70yrold female with fatty liver disease, and intermittent N/V for two years, here for EGD.    ROS: 10 point ROS was obtained and is negative except as mentioned in the HPI.    All available medical, surgical, personal/social history was reviewed.     Physical exam:   General Appearance: alert, no distress, pleasant affect, cooperative.  Eyes: conjunctivae and corneas clear. sclerae normal.  Mouth: normal.  Heart: normal rate and regular rhythm.  Lungs: clear to auscultation.  Abdomen: BS normal. Abdomen soft, non-tender.  Extremities:  no edema.     Labs/imaging:   [facility] reviewed: Refer to computer database in the electronic medical record.   Performed outside of [facility](reviewed if applicable): N/A    Impression/ plan:   EGD    Pre-Sedation/Anesthesia Assessment:    Patient is a candidate for moderate sedation.  Patient is ASA status: 2 - Mild, controlled systemic disease and no functional limitations    Airway Assessment:    Mallampati class 2  ROM normal    The procedure, risks, benefits, and alternatives were explained.  All patient questions were answered.  The informed consent was signed, and will be scanned into the computer database at a later date.     Patient barriers to learning: none     Patient/family understanding: verbalizes     DElpidio Anis MD (PGY 4  Clinical Fellow)  Department of Internal Medicine  Division of Gastroenterology & Hepatology  [facility] Pager: [phone] 07:43 02/12/2016    Attending attestation: I saw and evaluated the patient. I agree with the findings and the plan of care as documented in the fellow's note.    EGeorgeanne NimMD, MPH  Assistant Professor of Internal Medicine  Division of Gastroenterology and Hepatology

## 2016-02-12 NOTE — Nurse Focus (Signed)
LINE/WOUND CLEANUP NOTE    Note Started: 02/12/2016, 10:54     To maintain record integrity and accuracy, 1 active line & 3 active wounds were removed from the EMR database.     Leo Grosser, RN

## 2016-02-12 NOTE — Discharge Instructions (Signed)
Discharge Instructions for upper endoscopy    Findings:  1. A 1 cm polyp was seen in the duodenum (first part of the small intestine).  This was removed and sent to the pathologist for review.  2. No varices were seen.    Activity:    Rest today, resume usual activitiy tomorrow     Diet:    Resume usual diet:     Medication:    Resume all usual medications    Follow up:   We will notify you of your biopsy results via phone call or letter.If you do not receive notice of your results by three weeks, please call us at the phone number provided below.      During your procedure, air was pumped into your GI tract so your doctor could see clearly to make a diagnosis and/or treat your problem.     Some possible side effects you may experience are:   - Discomfort due to a distended (bloated) abdomen which will subside after a few hours to two days.   - Nausea may be a side effect of the medication and will subside.   - The medications you received may make you dizzy and sleepy, it is important that you do not drive, operate machinery or drink alcohol for at least one day.   - Severe pain is not expected and should be reported    Other side effects may include:  Upper Endoscopy: A sore throat can be helped by cough drops, or a salt-water gargle.    If problems, call [facility] at [phone]  during business hours Monday through Friday 7:30am to 4:30 pm.  After hours call [phone] and ask to speak to the GI Fellow on call.     Elpidio Anis, MD

## 2016-02-15 LAB — SURGICAL PATHOLOGY

## 2016-02-17 ENCOUNTER — Other Ambulatory Visit: Payer: Self-pay | Admitting: Family Medicine

## 2016-02-18 NOTE — Telephone Encounter (Signed)
Last seen by PCP on 12/12/15.

## 2016-02-21 ENCOUNTER — Telehealth: Payer: Self-pay | Admitting: GASTROENTEROLOGY

## 2016-02-21 ENCOUNTER — Encounter: Payer: Self-pay | Admitting: Family Medicine

## 2016-02-21 ENCOUNTER — Other Ambulatory Visit: Payer: Self-pay | Admitting: GASTROENTEROLOGY

## 2016-02-21 DIAGNOSIS — C7A8 Other malignant neuroendocrine tumors: Secondary | ICD-10-CM

## 2016-02-21 DIAGNOSIS — R195 Other fecal abnormalities: Secondary | ICD-10-CM

## 2016-02-21 NOTE — Telephone Encounter (Signed)
I called Paula Daugherty this morning about her diagnosis of neuroendocrine tumor.  I have ordered an octreotide scan for staging and referred her to oncology for possible treatments, surveillance.  She understands and is agreeable to this plan.

## 2016-02-21 NOTE — Telephone Encounter (Signed)
From: [name]  To: [name], MD  Sent: 02/21/2016 12:06 PM PDT  Subject: Visit Follow-up Question    Hi Dr. I got the cancer news this morning. We are pretty shocked to say the least. More test and oncology time. I just wanted you to know we know now. Also I've notice my stool has gotten very dark and tar like. Should we do a sample?

## 2016-02-21 NOTE — Progress Notes (Signed)
Spoke with radiologist. She stated that the order is incorrect. It should be Pet Gallium Dodate.     Please change order.    Thank you,  Lance Morin, MA II

## 2016-02-22 ENCOUNTER — Telehealth: Payer: Self-pay | Admitting: "Endocrinology

## 2016-02-22 ENCOUNTER — Ambulatory Visit: Payer: Medicare Other | Attending: Family Medicine

## 2016-02-22 DIAGNOSIS — R195 Other fecal abnormalities: Secondary | ICD-10-CM

## 2016-02-22 DIAGNOSIS — R710 Precipitous drop in hematocrit: Secondary | ICD-10-CM

## 2016-02-22 LAB — CBC WITH DIFFERENTIAL
Basophils % Auto: 1 %
Basophils Abs Auto: 0.1 10*3/uL (ref 0.0–0.2)
Eosinophils % Auto: 2.7 %
Eosinophils Abs Auto: 0.2 10*3/uL (ref 0.0–0.5)
Hematocrit: 30.9 % — ABNORMAL LOW (ref 36.0–46.0)
Hemoglobin: 10.5 g/dL — ABNORMAL LOW (ref 12.0–16.0)
Lymphocytes % Auto: 27.1 %
Lymphocytes Abs Auto: 2.4 10*3/uL (ref 1.0–4.8)
MCH: 30.4 pg (ref 27.0–33.0)
MCHC: 34 % (ref 32.0–36.0)
MCV: 89.4 UM3 (ref 80.0–100.0)
MPV: 8.2 UM3 (ref 6.8–10.0)
Monocytes % Auto: 6.8 %
Monocytes Abs Auto: 0.6 10*3/uL (ref 0.1–0.8)
Neutrophils % Auto: 62.4 %
Neutrophils Abs Auto: 5.4 10*3/uL (ref 1.8–7.7)
Platelet Count: 280 10*3/uL (ref 130–400)
RDW: 12.9 units (ref 0.0–14.7)
Red Blood Cell Count: 3.45 10*6/uL — ABNORMAL LOW (ref 4.00–5.20)
White Blood Cell Count: 8.7 10*3/uL (ref 4.5–11.0)

## 2016-02-22 NOTE — Telephone Encounter (Signed)
Noted patient's hemoglobin had dropped to 10.5 from pre-procedure baseline of 13-14. She reported black, tarry stools, some stomach pain and some lightheadedness. I discussed lab result with Dr. [name] (ordering physician) and we agreed it was best Ms. Daddario seek further care at the ER.     I called back Ms. Hassell Done and we discussed the above. She agreed with the plan and will leave for the ER now.     Best contact info:   Husband Radonna Ricker) cell: [phone]    All questions were answered and the patient agreed with the above plan.     Sunday Spillers, M.D.  Clinical Assistant Professor  Department of Endocrinology  Pager # [phone]

## 2016-02-22 NOTE — Telephone Encounter (Signed)
Please, schedule with doctor Jaeger next week; see mychart. Betsey Amen, MD

## 2016-02-22 NOTE — Progress Notes (Signed)
Reviewed EMR  - test is scheduled for 02/28/16. Sharlot Gowda, RN

## 2016-02-22 NOTE — Telephone Encounter (Signed)
-----   Message from [name] sent at 02/22/2016 12:42 PM PDT -----  Regarding: Non-urgent Medical Advice Question  Contact: [phone]  Hi Dr.  I wanted to contact you because yesterday I was told that the polyp removed from my small intestine was a Neuroendocrine tumor.  I was wondering if you think it might have a connection to what you found on my adrenal gland?  I have a PET on the 26th. Needless to say I am pretty scared.  Between this and DJ getting sick life is getting pretty tough. My sugars remain high but then so is my stress.  Thanks

## 2016-02-22 NOTE — Telephone Encounter (Signed)
I called the patient back in response to this message. She is scheduled for PET DOTA scan next week and will let me know when she has this result back. While carcinoid could metastasis to the adrenal gland, this would be a more rare presentation. Will follow up on the results of the PET scan next week. Patient has also been referred to oncology.     Sunday Spillers, M.D.  Clinical Assistant Professor  Department of Endocrinology  Pager # [phone]

## 2016-02-22 NOTE — Telephone Encounter (Signed)
I spoke to patient who is headed to ER now; due to drop from 13 to 10 and tarry stools further evaluation is needed and she agrees.

## 2016-02-22 NOTE — Telephone Encounter (Signed)
I agree.  She should have an EGD to assess the polypectomy site.

## 2016-02-24 NOTE — Telephone Encounter (Signed)
Patient contacted 02/24/2016 while at [facility]  She reports that plan is to likely go home today and that EGD required a clip and her last recall of her hemoglobin was above 9.   Patient encouraged to see me by Wednesday and we can recheck a CBC prior.  Of note, patient began conversation with me suggesting that I had suggested [facility] for "better care"; my reply was that I would never have suggested to go outside [facility] for "better care" and that the wait at the ED would have been significantly more at [facility] on a Friday evening.  Patient reported that the care was suboptimal due to the fact that they held her [name] and did not change her sheets since Friday; but her care with  GI and some of the nurses was good and that she had been admitted and had a bed by 11PM Friday.     Further follow-up with GI will also be needed--will route to DR [facility].     CBC order is in place.  I will route to nurse triage to check in with patient early this week and offer an appointment by Wednesday and have patient recheck CBC on day prior.  Thanks.

## 2016-02-24 NOTE — Addendum Note (Signed)
Addended by: [name] on: 02/24/2016 11:01 AM     Modules accepted: Orders

## 2016-02-25 NOTE — Telephone Encounter (Signed)
3 patient identifiers used  Relayed MD's message to patient.  Appointment made for Wednesday at 1:30 pm for follow up.  Patient verbalizes understanding and agrees to callback with any increase in symptoms or concern.  Patient will come in tomorrow to get labs done.

## 2016-02-26 ENCOUNTER — Ambulatory Visit: Payer: Medicare Other | Attending: Family Medicine

## 2016-02-26 DIAGNOSIS — R710 Precipitous drop in hematocrit: Secondary | ICD-10-CM | POA: Insufficient documentation

## 2016-02-26 LAB — CBC WITH DIFFERENTIAL
Basophils % Auto: 0.6 %
Basophils Abs Auto: 0.1 10*3/uL (ref 0.0–0.2)
Eosinophils % Auto: 3 %
Eosinophils Abs Auto: 0.2 10*3/uL (ref 0.0–0.5)
Hematocrit: 30.4 % — ABNORMAL LOW (ref 36.0–46.0)
Hemoglobin: 10 g/dL — ABNORMAL LOW (ref 12.0–16.0)
Lymphocytes % Auto: 26.5 %
Lymphocytes Abs Auto: 2.2 10*3/uL (ref 1.0–4.8)
MCH: 29.5 pg (ref 27.0–33.0)
MCHC: 33 % (ref 32.0–36.0)
MCV: 89.6 UM3 (ref 80.0–100.0)
MPV: 8.2 UM3 (ref 6.8–10.0)
Monocytes % Auto: 7.5 %
Monocytes Abs Auto: 0.6 10*3/uL (ref 0.1–0.8)
Neutrophils % Auto: 62.4 %
Neutrophils Abs Auto: 5.2 10*3/uL (ref 1.8–7.7)
Platelet Count: 286 10*3/uL (ref 130–400)
RDW: 13 units (ref 0.0–14.7)
Red Blood Cell Count: 3.39 10*6/uL — ABNORMAL LOW (ref 4.00–5.20)
White Blood Cell Count: 8.3 10*3/uL (ref 4.5–11.0)

## 2016-02-27 ENCOUNTER — Encounter: Payer: Self-pay | Admitting: Family Medicine

## 2016-02-27 ENCOUNTER — Encounter: Payer: Self-pay | Admitting: Surgical Oncology

## 2016-02-27 ENCOUNTER — Ambulatory Visit: Payer: Medicare Other | Admitting: Family Medicine

## 2016-02-27 VITALS — BP 117/70 | HR 97 | Temp 98.0°F | Wt 183.4 lb

## 2016-02-27 DIAGNOSIS — K7400 Hepatic fibrosis, unspecified: Secondary | ICD-10-CM

## 2016-02-27 DIAGNOSIS — K922 Gastrointestinal hemorrhage, unspecified: Secondary | ICD-10-CM

## 2016-02-27 MED ORDER — FERROUS GLUCONATE 324 MG (38 MG IRON) TABLET
324.0000 mg | ORAL_TABLET | Freq: Three times a day (TID) | ORAL | 11 refills | Status: DC
Start: 2016-02-27 — End: 2016-05-20

## 2016-02-27 MED ORDER — SUCRALFATE 100 MG/ML ORAL SUSPENSION
1.0000 g | Freq: Two times a day (BID) | ORAL | 3 refills | Status: DC
Start: 2016-02-27 — End: 2016-06-13

## 2016-02-27 MED ORDER — OMEPRAZOLE 20 MG CAPSULE,DELAYED RELEASE
1.0000 | DELAYED_RELEASE_CAPSULE | Freq: Two times a day (BID) | ORAL | 0 refills | Status: DC
Start: 2016-02-27 — End: 2016-11-07

## 2016-02-27 NOTE — Progress Notes (Signed)
Paula Daugherty is a 55yrfemale who presents with a chief complaint of "here for follow-up from hospital discharge".    1. Upper [facility] bleed: [facility] after reports tarry stool on e-mail message; patient was scoped and clip placed while admitted; last Hgb of 9 prior to discharge; no vomiting or melena per patient and slight episode of nausea and abdominal discomfort today  2. Neuroendocrine tumor: biopsy consistent with this; patient to have PET scan done tomorrow   3. Liver fibrosis: no esophageal varices per EGD; LFTs slightly elevated while at [facility];pt to recheck with next set of labs next week     ROS:   .Constitutional: no fever/chills  CV: no chest pain, palpitations, shortness of breath or edema  Hgb of 10 from 02/26/2016    Past Medical History:   Diagnosis Date    Asthma     Cirrhosis     Diabetes mellitus     Hypertension     Kidney disease        Current Outpatient Prescriptions on File Prior to Visit   Medication Sig Dispense Refill    Amlodipine (NORVASC) 10 mg Tablet TAKE 1 TABLET BY MOUTH EVERY DAY. 90 tablet 1    Aspirin 81 mg DR Tablet EC Tablet Take 1 tablet by mouth every day. 30 tablet 5    Atorvastatin (LIPITOR) 10 mg Tablet Take 1 tablet by mouth every day at bedtime. 90 tablet 3    Fluorouracil (EFUDEX) 5 % Cream Apply to the affected area 2 times daily. Apply to lesions as directed on nose twice daily for 2-3 weeks 40 g 0    FLUTICASONE/SALMETEROL (ADVAIR DISKUS INHA) Take  by inhalation.      GlipiZIDE (GLUCOTROL XL) 10 mg SR Tablet Take 1 tablet by mouth every morning with a meal. (diabetes) 30 tablet 5    Losartan (COZAAR) 25 mg Tablet Take 1 tablet by mouth every day. 90 tablet 3    Metformin (GLUCOPHAGE) 1,000 mg tablet Take 1 tablet by mouth 2 times daily with meals. (diabetes) 180 tablet 3    ONETOUCH ULTRA TEST Strips USE FOR TESTING BLOOD GLUCOSE 3 TIMES PER DAY 300 strip 11    Venlafaxine ([name]) 75 mg Tablet Take 1  tablet by mouth 3 times daily. 90 tablet 2     No current facility-administered medications on file prior to visit.      Allergies:   Allergies   Allergen Reactions    Contrast Dye [Radiopaque Agent] Hives    Hydrochlorothiazide Other-Reaction in Comments     Patient reports    Januvia [Sitagliptin] Other-Reaction in Comments     Patient reports    Lyrica [Pregabalin] Other-Reaction in Comments     Patient reports    Omnipaque [Iohexol] Other-Reaction in Comments     Patient reports    Prozac [Fluoxetine Hcl] Other-Reaction in Comments     Patient reports    Sulfa (Sulfonamide Antibiotics) Rash    Topiramate Other-Reaction in Comments     Hairfall       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       Social History Main Topics    Smoking status: Former  Smoker     Packs/day: 0.10     Years: 20.00    Smokeless tobacco: Former Systems developer      Comment: quit 30 years ago    Alcohol use 0.0 oz/week     0 Standard drinks or equivalent per week      Comment: rare shares a beer or glass wine every 2-3 months     Drug use: No    Sexual activity: Yes     Partners: Male     Other Topics Concern    None     Social History Narrative     Family History   Problem Relation Age of Onset    Diabetes Father     Heart Mother      MI at 57s     Non-contributory Brother     Non-contributory Brother        PE:  BP 117/70  Pulse 97  Temp 36.7 C (98 F) (Temporal)  Wt 83.2 kg (183 lb 6.8 oz)  LMP 05/06/1983  BMI 33.55 kg/m2  .General Appearance: healthy, alert, no distress, pleasant affect, cooperative.  Heart:  normal rate and regular rhythm, no murmurs, clicks, or gallops.  Lungs: clear to auscultation.  Abdomen: BS normal.  Abdomen soft, non-tender.  No masses or organomegaly.  Extremities:  no cyanosis, clubbing, or edema.  Mental Status: patient continues to  think that I mentioned option to go to [facility] ED in an effort to get "better care"    Assessment and Plan:  (K92.2) Upper GI bleed  (primary encounter diagnosis)  Comment: seemed at site of biopsy and delayed; Hgb seems up from time of discharge when it was at 9; ED precautions given    Plan: CBC WITH DIFFERENTIAL, Ferrous Gluconate 324 mg        (38 mg iron) Tablet, Sucralfate (CARAFATE) 100         mg/mL Liquid, GASTROENTEROLOGY REFERRAL        Patient to check CBC early next week     (K74.0) Liver fibrosis  Comment: slight elevation for LFTs while at [facility]  Plan: COMPREHENSIVE METABOLIC PANEL        To check labs with next CBC      Total encounter time including history, physical examination, and coordination of care was approximately 25 minutes of which more than 50% was spent counseling regarding assessment/diagnosis and treatment plan. No guarantees were made regarding her medical care or treatment outcome. Barriers to Learning: none.  Patient verbalizes understanding of teaching and instructions.    Electronically signed by:    [name], MD  [place], [place] [employer]  Associate physician [facility], [place]   [phone]

## 2016-02-27 NOTE — Nursing Note (Signed)
Vital signs taken, allergies verified, screened for pain, tobacco hx verified, pharmacy verified.  Koran Seabrook MA

## 2016-02-27 NOTE — Patient Instructions (Addendum)
A laboratory test has been ordered for you.     Please seek evaluation for any signs of bleeding or concerns.   Call or return to clinic if symptoms worsen, fail to improve  or other symptoms develop.     Omeprazole twice daily now x 1 month then back down to once daily.  Iron 2-3 times per day with sip of IJ.  Carafate 2 times daily for the next month.

## 2016-02-28 ENCOUNTER — Encounter: Payer: Self-pay | Admitting: GASTROENTEROLOGY

## 2016-02-28 ENCOUNTER — Ambulatory Visit
Admission: RE | Admit: 2016-02-28 | Discharge: 2016-02-28 | Disposition: A | Discharge status: Home / Self Care | Payer: Medicare Other | Source: Ambulatory Visit | Attending: Nuclear Medicine | Admitting: Nuclear Medicine

## 2016-02-28 DIAGNOSIS — C7A8 Other malignant neuroendocrine tumors: Secondary | ICD-10-CM | POA: Insufficient documentation

## 2016-02-28 NOTE — Progress Notes (Signed)
Thanks [name].  As long as Hgb remains stable and there is no melena we will not need to repeat it.  Please let me know.

## 2016-02-29 ENCOUNTER — Ambulatory Visit: Payer: Medicare Other

## 2016-02-29 ENCOUNTER — Ambulatory Visit: Payer: Medicare Other | Attending: Internal Medicine | Admitting: Internal Medicine

## 2016-02-29 VITALS — BP 116/60 | HR 74 | Temp 98.3°F | Resp 16 | Ht 63.0 in | Wt 183.6 lb

## 2016-02-29 DIAGNOSIS — K922 Gastrointestinal hemorrhage, unspecified: Secondary | ICD-10-CM | POA: Insufficient documentation

## 2016-02-29 DIAGNOSIS — E1121 Type 2 diabetes mellitus with diabetic nephropathy: Secondary | ICD-10-CM

## 2016-02-29 DIAGNOSIS — K7400 Hepatic fibrosis, unspecified: Secondary | ICD-10-CM

## 2016-02-29 DIAGNOSIS — R112 Nausea with vomiting, unspecified: Secondary | ICD-10-CM

## 2016-02-29 DIAGNOSIS — C7A010 Malignant carcinoid tumor of the duodenum: Secondary | ICD-10-CM | POA: Insufficient documentation

## 2016-02-29 DIAGNOSIS — R1084 Generalized abdominal pain: Secondary | ICD-10-CM

## 2016-02-29 DIAGNOSIS — C7A8 Other malignant neuroendocrine tumors: Secondary | ICD-10-CM

## 2016-02-29 LAB — COMPREHENSIVE METABOLIC PANEL
Alanine Transferase (ALT): 46 U/L (ref 5–54)
Albumin: 3.8 g/dL (ref 3.2–4.6)
Alkaline Phosphatase (ALP): 100 U/L (ref 35–115)
Aspartate Transaminase (AST): 44 U/L — ABNORMAL HIGH (ref 15–43)
Bilirubin Total: 0.5 mg/dL (ref 0.3–1.3)
Calcium: 9.4 mg/dL (ref 8.6–10.5)
Carbon Dioxide Total: 28 mmol/L (ref 24–32)
Chloride: 103 mmol/L (ref 95–110)
Creatinine Serum: 1.1 mg/dL (ref 0.44–1.27)
E-GFR Creatinine (Male): 52 mL/min/{1.73_m2}
E-GFR, African American (Male): 60 mL/min/{1.73_m2}
Glucose: 133 mg/dL — ABNORMAL HIGH (ref 70–99)
Potassium: 4.1 mmol/L (ref 3.3–5.0)
Protein: 7.6 g/dL (ref 6.3–8.3)
Sodium: 141 mmol/L (ref 135–145)
Urea Nitrogen, Blood (BUN): 21 mg/dL (ref 8–22)

## 2016-02-29 LAB — IRON TOTAL: Iron Total: 25 ug/dL — ABNORMAL LOW (ref 42–135)

## 2016-02-29 LAB — TRANSFERRIN
Iron Percent Saturation: 6.6 % — ABNORMAL LOW (ref 15.0–50.0)
Iron Total: 25 ug/dL — ABNORMAL LOW (ref 42–135)
Total Iron Binding Capacity: 378 µg/mL (ref 280–400)
Transferrin: 272 mg/dL (ref 192–382)

## 2016-02-29 LAB — CBC WITH DIFFERENTIAL
Basophils % Auto: 0.8 %
Basophils Abs Auto: 0.1 10*3/uL (ref 0.0–0.2)
Eosinophils % Auto: 2.3 %
Eosinophils Abs Auto: 0.2 10*3/uL (ref 0.0–0.5)
Hematocrit: 30.1 % — ABNORMAL LOW (ref 36.0–46.0)
Hemoglobin: 10.3 g/dL — ABNORMAL LOW (ref 12.0–16.0)
Lymphocytes % Auto: 25 %
Lymphocytes Abs Auto: 2.4 10*3/uL (ref 1.0–4.8)
MCH: 30.3 pg (ref 27.0–33.0)
MCHC: 34.3 % (ref 32.0–36.0)
MCV: 88.5 UM3 (ref 80.0–100.0)
MPV: 7.3 UM3 (ref 6.8–10.0)
Monocytes % Auto: 7.4 %
Monocytes Abs Auto: 0.7 10*3/uL (ref 0.1–0.8)
Neutrophils % Auto: 64.5 %
Neutrophils Abs Auto: 6.1 10*3/uL (ref 1.8–7.7)
Platelet Count: 357 10*3/uL (ref 130–400)
RDW: 13.1 units (ref 0.0–14.7)
Red Blood Cell Count: 3.4 10*6/uL — ABNORMAL LOW (ref 4.00–5.20)
White Blood Cell Count: 9.5 10*3/uL (ref 4.5–11.0)

## 2016-02-29 LAB — FERRITIN: Ferritin: 15 ng/mL (ref 10–291)

## 2016-02-29 NOTE — Progress Notes (Signed)
HEMATOLOGY AND ONCOLOGY NEW PATIENT CLINIC NOTE    REFERRING PHYSICIAN: Dr. Virgel Bouquet     REASON FOR REFERRAL: 70yrold female referred for well differentiated neuroendocrine tumor of duodenum    HEMATOLOGIC/ONCOLOGIC HISTORY  Patient with multiple medical problems including type II diabetes, obesity and NAFLD who presented with chronic intermittent nausea with vomiting and abdominal pain for two years.  Been seen by hepatologist Dr. CVirgel Bouquet  Abdominal CT on 12/28/15 is unrevealing.  She underwent EGD on 02/12/2016 which revealed a 1-1.5 cm polyp in the second part of the duodenum.  Other findings include mild antral gastritis and no evidence of varices.  The duodenal polyp was transected by hot snare.  Pathology is consistent with grade 1 well differentiated neuroendocrine tumor with positive synaptophysin.  Ki67 < 2%.  GSeth.ContesDOTATATE PET/CT scan on 02/28/16 reveals no evidence of residual or metastatic disease.    [facility] after having black tarry stool on 02/22/16.  Endoscopy was performed and clip was placed.  No further known episode of GIB.       DISEASE STATUS: localized     SUMMARY OF HEMATOLOGIC/ONCOLOGIC THERAPY, RESPONSE & SIDE EFFECTS  1. S/p polyp removal    ECOG PERFORMANCE STATUS  [name]    HISTORY OF PRESENT ILLNESS  Regarding our shared patient, Paula Shawhanis a very pleasant 63yrld female with multiple medical problems including type II diabetes, obesity and NAFLD who is recently found to have duodenal neuroendocrine tumor.  Today she is accompanied by her husband and daughter.  She reports having chronic nausea and vomiting for 2 years.  The symptoms occur every few days, and is unrelated to food intake.  She has two episodes of emesis over the past two weeks and the vomiting is NBNB.  She also notes intermittent abdominal pain along the midline.  The pain is mild and non-radiating, and is often resolved  spontaneously.  Not related to nausea/vomiting.  She has been having intermittent flushing for several years.  Regarding bowel pattern, it is alternating between constipation and diarrhea for several years and overall unchanged.  Patient is otherwise doing fine.  No further episodes of black tarry stools [facility] Appetite is good and weight is stable.  Energy level is at baseline.    REVIEW OF SYSTEMS  CONSTITUTIONAL: negative  EYES: negative  EARS, NOSE, AND THROAT: negative  LYMPHATICS: negative  RESPIRATORY: negative  CARDIOVASCULAR: negative  GASTROINTESTINAL: positive for intermittent abdominal pain, nausea and vomiting. Negative for melena, jaundice, hematochezia, rectal bleeding  MUSCULOSKELETAL: negative  SKIN: negative  NEUROLOGIC: negative    PAST MEDICAL HISTORY  Patient Active Problem List   Diagnosis    DM2 (diabetes mellitus, type 2)    Depression with anxiety    Stress at home    Leg cramps-right calf    Right ear pain    Fibromyalgia    HTN (hypertension)    GERD (gastroesophageal reflux disease)    Hyperlipidemia with target LDL less than 70    Vitamin D insufficiency    Abnormal LFTs    Renal cyst ( 6.4 cm cyst left kidney)    Cough    Type 2 diabetes mellitus without complication    Fatty liver    Macroalbuminuric diabetic nephropathy    History of actinic keratoses    Cataract    Ptosis of eyelid    Liver  fibrosis    Adrenal nodule     Past Medical History:  No date: Asthma  No date: Cirrhosis  No date: Diabetes mellitus  No date: Hypertension  No date: Kidney disease      ALLERGIES  Contrast dye [radiopaque agent]; Hydrochlorothiazide; Januvia [sitagliptin]; Lyrica [pregabalin]; Omnipaque [iohexol]; Prozac [fluoxetine hcl]; Sulfa (sulfonamide antibiotics); and Topiramate    CURRENT MEDICATIONS    Current Outpatient Prescriptions:     Amlodipine (NORVASC) 10 mg Tablet, TAKE 1 TABLET BY MOUTH EVERY DAY., Disp: 90 tablet, Rfl: 1    Aspirin  81 mg DR Tablet EC Tablet, Take 1 tablet by mouth every day., Disp: 30 tablet, Rfl: 5    Atorvastatin (LIPITOR) 10 mg Tablet, Take 1 tablet by mouth every day at bedtime., Disp: 90 tablet, Rfl: 3    Ferrous Gluconate 324 mg (38 mg iron) Tablet, Take 1 tablet by mouth 3 times daily., Disp: 90 tablet, Rfl: 11    Fluorouracil (EFUDEX) 5 % Cream, Apply to the affected area 2 times daily. Apply to lesions as directed on nose twice daily for 2-3 weeks, Disp: 40 g, Rfl: 0    FLUTICASONE/SALMETEROL (ADVAIR DISKUS INHA), Take  by inhalation., Disp: , Rfl:     GlipiZIDE (GLUCOTROL XL) 10 mg SR Tablet, Take 1 tablet by mouth every morning with a meal. (diabetes), Disp: 30 tablet, Rfl: 5    Losartan (COZAAR) 25 mg Tablet, Take 1 tablet by mouth every day., Disp: 90 tablet, Rfl: 3    Metformin (GLUCOPHAGE) 1,000 mg tablet, Take 1 tablet by mouth 2 times daily with meals. (diabetes), Disp: 180 tablet, Rfl: 3    Omeprazole (PRILOSEC) 20 mg Delayed Release Capsule, Take 1 capsule by mouth two times daily before meals., Disp: 60 capsule, Rfl: 0    ONETOUCH ULTRA TEST Strips, USE FOR TESTING BLOOD GLUCOSE 3 TIMES PER DAY, Disp: 300 strip, Rfl: 11    Sucralfate (CARAFATE) 100 mg/mL Liquid, Take 10 mL by mouth 2 times daily., Disp: 600 mL, Rfl: 3    Venlafaxine ([name]) 75 mg Tablet, Take 1 tablet by mouth 3 times daily., Disp: 90 tablet, Rfl: 2      FAMILY HISTORY    family history includes Diabetes in her father; Heart in her mother; Non-contributory in her brother and brother.    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       Social History Main Topics    Smoking status: Former Smoker     Packs/day: 0.10     Years: 20.00    Smokeless tobacco: Former Systems developer      Comment: quit 30 years ago    Alcohol use 0.0 oz/week     0 Standard drinks or  equivalent per week      Comment: rare shares a beer or glass wine every 2-3 months     Drug use: No    Sexual activity: Yes     Partners: Male     Other Topics Concern    Not on file     Social History Narrative       >>>PHYSICAL EXAMINATION  Temp: 36.8 C (98.3 F) (10/27 0917)  Temp src: Temporal (10/27 0917)  Pulse: 74 (10/27 0917)  BP: 116/60 (10/27 0917)  Resp: 16 (10/27 0917)  SpO2: 100 % (10/27 0917)  Height: 160 cm (5' 3" ) (10/27 0917)  Weight: 83.3 kg (183 lb 10.3 oz) (10/27 0917)     General: elderly overweight femalein no acute distress  HEENT: PERRL, EOMI. MMM, no oropharyngeal lesion noted.   Lymphatics: no palpable lymphadenopathy  CV: regular rate and rhythm, no rub or gallop  Pulm: decreased breath sounds at bibasilar area  Abd: soft, nondistended.  Nontender. Normoactive bowel sounds.  Ext:no c/c/e  Skin:no ulcers or skin lesion on sun exposed skin  Neuro: alert and oriented,answering questions appropriately    PATHOLOGY   A.  DUODENUM, POLYP (BIOPSY):       -  Well differentiated neuroendocrine tumor, Grade 1, transected, see         comment       -  Maximum size: 7.5 mm (measured on slide)       -  Ki67 <2%       -  Synaptophysin positive     IMAGING  DD22 DOTATATE PET/CT  -no evidence of residual or metastatic disease.  -post biopsy changes within the transverse duodenum without abnormal radiotracer activity     LABORATORY    Patient Active Problem List   WHITE BLOOD CELL COUNT (K/MM3)   Date Value   08/30/2015 8.9     White Blood Cell Count (K/MM3)   Date Value   02/26/2016 8.3     HEMOGLOBIN (g/dL)   Date Value   08/30/2015 14.4     Hemoglobin (g/dL)   Date Value   02/26/2016 10.0     MCV (UM3)   Date Value   02/26/2016 89.6   08/30/2015 83.6     MCHC (%)   Date Value   02/26/2016 33.0   08/30/2015 32.3     RDW   Date Value   02/26/2016 13.0 units   08/30/2015 15.6 UNITS     PLATELET COUNT (K/MM3)   Date Value   08/30/2015 217     Platelet Count  (K/MM3)   Date Value   02/26/2016 286     SODIUM (mEq/L)   Date Value   08/30/2015 142     Sodium (mmol/L)   Date Value   02/08/2016 140     POTASSIUM (mEq/L)   Date Value   08/30/2015 4.0     Potassium (mmol/L)   Date Value   02/08/2016 3.9     CHLORIDE (mEq/L)   Date Value   08/30/2015 104     Chloride (mmol/L)   Date Value   02/08/2016 103     CARBON DIOXIDE TOTAL (mEq/L)   Date Value   08/30/2015 22     Carbon Dioxide Total (mmol/L)   Date Value   02/08/2016 26     CREATININE BLOOD (mg/dL)   Date Value   08/30/2015 0.88     Creatinine Serum (mg/dL)   Date Value   02/08/2016 1.06     UREA NITROGEN, BLOOD (BUN) (mg/dL)   Date Value   08/30/2015 19     Urea Nitrogen, Blood (BUN) (mg/dL)   Date Value   02/08/2016 12       >>>IMPRESSION  1. 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 Chronic intermittent nausea, vomiting and abdominal pain  3. History of UGIB s/p clip  4.  Other multiple medical co-morbidities including type II diabetes, obesity and NAFLD     DISCUSSION  Helayne Metsker is a very pleasant 70yrold female with multiple medical co-morbidities who was incidentally found to have neuroendocrine tumor of duodenum.  Today we have a lengthy discussion regarding her disease, diagnosis, staging and treatment options.  Duodenal neuroendocrine tumors are relatively rare and comprise only 2-3% of all GI endocrine tumors.  The majority of duodenal NET are nonfunctional and indolent with benign progression.  However it could sometimes cause Zollinger-York syndrome and other clinical hormonal syndromes such as Cushing.  Given the recent UGIB and possible association with ZES, will send for gastrin level.  Chromogranin A is category 3 recommendation per NCCN guideline and will not check.  Patient's disease is localized and GSeth.ContesDOTATATE PET/CT after polyp removal is negative.  Resection of the tumor would be main treatment for patients with locoregional disease.   Given the polyp was transected and thus incompletely removed, will discuss with Dr CVirgel Bouquetre: repeating EGD/EUS for complete endoscopic resection.  Patient and family verbalized understanding of the plan and agree to proceed.    REVIEW OF RECOMMENDATIONS  1. Diagnostics:   -gastrin level    2. Therapy:   -will discuss with Dr CVirgel Bouquetre: repeating EGD/EUS for complete endoscopic resection    3. Followup:    -in 3 months    Side effects of above therapy were discussed at length and patient acknowledged risk and benefits associated with treatment.      Thank you for allowing me to participate in the care of the patient.   Total time of this consultation was 60 minutes.  Greater than 50% of the time was spent discussing the patient's disease, prognosis, symptoms, and treatment plan.     Sincerely,    Electronically signed by:  KMarlow Baars MD, MHS  Assistant Professor of Medicine  Hematology Oncology Division  Pager: [phone]

## 2016-02-29 NOTE — Nursing Note (Signed)
Verified patient with 2 identifiers, vitals taken, screen for pain, pharmacy verified, allergies verified    Abhijay Morriss, MA     Reviewed with patient if any new or worsening emotional or social concerns.     No- No action needed.

## 2016-03-01 LAB — GASTRIN, SERUM SENDOUT: Gastrin, Serum: 58 pg/mL (ref 0–100)

## 2016-03-05 ENCOUNTER — Encounter: Payer: Self-pay | Admitting: "Endocrinology

## 2016-03-05 NOTE — Telephone Encounter (Signed)
Called patient 3 patient id's verified. Patient scheduled 03/07/16 at 11:20 AM.    Provided [phone] if patient needs any further assistance.  Thank you,  Preston Fleeting, MA I

## 2016-03-06 ENCOUNTER — Encounter: Payer: Self-pay | Admitting: Internal Medicine

## 2016-03-06 ENCOUNTER — Ambulatory Visit: Payer: Medicare Other | Attending: Family Medicine

## 2016-03-06 DIAGNOSIS — K922 Gastrointestinal hemorrhage, unspecified: Secondary | ICD-10-CM | POA: Insufficient documentation

## 2016-03-06 LAB — CBC WITH DIFFERENTIAL
Basophils % Auto: 1 %
Basophils Abs Auto: 0.1 10*3/uL (ref 0.0–0.2)
Eosinophils % Auto: 3 %
Eosinophils Abs Auto: 0.2 10*3/uL (ref 0.0–0.5)
Hematocrit: 32.2 % — ABNORMAL LOW (ref 36.0–46.0)
Hemoglobin: 10.5 g/dL — ABNORMAL LOW (ref 12.0–16.0)
Lymphocytes % Auto: 24.8 %
Lymphocytes Abs Auto: 1.8 10*3/uL (ref 1.0–4.8)
MCH: 28.7 pg (ref 27.0–33.0)
MCHC: 32.8 % (ref 32.0–36.0)
MCV: 87.6 UM3 (ref 80.0–100.0)
MPV: 8 UM3 (ref 6.8–10.0)
Monocytes % Auto: 7.3 %
Monocytes Abs Auto: 0.5 10*3/uL (ref 0.1–0.8)
Neutrophils % Auto: 63.9 %
Neutrophils Abs Auto: 4.6 10*3/uL (ref 1.8–7.7)
Platelet Count: 273 10*3/uL (ref 130–400)
RDW: 13.4 units (ref 0.0–14.7)
Red Blood Cell Count: 3.68 10*6/uL — ABNORMAL LOW (ref 4.00–5.20)
White Blood Cell Count: 7.3 10*3/uL (ref 4.5–11.0)

## 2016-03-06 NOTE — Telephone Encounter (Signed)
Called and spoke with patient.  Explained to her that the duodenal polyp was transected but not completed removed yet.  I've sent a message to Dr Virgel Bouquet to consider repeat EGD/EUS for endoscopic resection; pt has an appt with Dr Virgel Bouquet tomorrow.  I also informed her the gastrin level is normal.  Pt verbalized understanding of the plan and appreciated the call.

## 2016-03-07 ENCOUNTER — Encounter: Payer: Self-pay | Admitting: GASTROENTEROLOGY

## 2016-03-07 ENCOUNTER — Ambulatory Visit: Payer: Medicare Other | Admitting: GASTROENTEROLOGY

## 2016-03-07 ENCOUNTER — Ambulatory Visit: Payer: Medicare Other | Admitting: "Endocrinology

## 2016-03-07 ENCOUNTER — Encounter: Payer: Self-pay | Admitting: "Endocrinology

## 2016-03-07 VITALS — BP 122/76 | HR 109 | Temp 97.0°F | Resp 16 | Ht 62.0 in | Wt 181.0 lb

## 2016-03-07 VITALS — BP 135/80 | HR 103 | Temp 97.8°F | Resp 16 | Wt 181.0 lb

## 2016-03-07 DIAGNOSIS — E119 Type 2 diabetes mellitus without complications: Secondary | ICD-10-CM

## 2016-03-07 DIAGNOSIS — E559 Vitamin D deficiency, unspecified: Secondary | ICD-10-CM

## 2016-03-07 DIAGNOSIS — D3A098 Benign carcinoid tumors of other sites: Secondary | ICD-10-CM

## 2016-03-07 DIAGNOSIS — E279 Disorder of adrenal gland, unspecified: Secondary | ICD-10-CM

## 2016-03-07 DIAGNOSIS — E1129 Type 2 diabetes mellitus with other diabetic kidney complication: Secondary | ICD-10-CM

## 2016-03-07 MED ORDER — ONDANSETRON 8 MG DISINTEGRATING TABLET: 8.0000 mg | DISINTEGRATING_TABLET | Freq: Three times a day (TID) | ORAL | 1 refills | Status: DC | PRN

## 2016-03-07 MED ORDER — ATORVASTATIN 10 MG TABLET: 1.0000 | ORAL_TABLET | Freq: Every day | ORAL | 3 refills | Status: DC

## 2016-03-07 MED ORDER — LOSARTAN 25 MG TABLET
1.0000 | ORAL_TABLET | Freq: Every day | ORAL | 3 refills | Status: DC
Start: 2016-03-07 — End: 2016-09-26

## 2016-03-07 MED ORDER — GLIPIZIDE ER 10 MG TABLET, EXTENDED RELEASE 24 HR
1.0000 | EXTENDED_RELEASE_CAPSULE | Freq: Every day | ORAL | 5 refills | Status: DC
Start: 2016-03-07 — End: 2016-06-13

## 2016-03-07 NOTE — Nursing Note (Signed)
Vital signs taken, allergies verified, screened for pain, med hx taken,  screened for chicken pox, and verified immunization status.  Haniel Fix Dovganyuk Jr. M.A

## 2016-03-07 NOTE — Nursing Note (Signed)
Patient verified by name and date of birth.   Vital signs assessed, allergies verified, screened for pain and verified pharmacy.   Adden Strout Banducci Bristow, MA

## 2016-03-07 NOTE — Progress Notes (Signed)
Hepatology Outpatient Progress Note    Patient Name: Paula Daugherty  MRN: [id number]  Date of Service: 03/07/2016  Referring Provider: [name], MD  Attending Physician: Georgeanne Nim MD, MPH    REASON FOR CONSULTATION: NASH, liver fibrosis    SUBJECTIVE:  Paula Daugherty is a 70yrold female with history of DM and obesity BMI 32.  She has fatty liver [place] which showed fat in her liver without splenomegaly and a liver fibrosis score of 12.9 (F3) suggestive of severe fibrosis.  She is asymptomatic at this time.  She is trying to watch her diet and does not exercise.  ALT level is within normal limits.    EGD was done last month which showed a 0.7 cm duodenal polyp.  It was snared and pathology was consistent with carcinoid.  Her post-EGD course was complicated by post polypectomy bleeding which [facility]  Octreotide scan was done and negative for residual disease.    She symptoms of intermittent nausea and vomiting have not improved.    PAST MEDICAL HISTORY:  Past Medical History:   Diagnosis Date    Asthma     Cirrhosis     Diabetes mellitus     Hypertension     Kidney disease        ALLERGIES:    Contrast Dye [Radiopaque Agent]    Hives  Hydrochlorothiazide    Other-Reaction in Comments    Comment:Patient reports  Januvia [Sitagliptin]    Other-Reaction in Comments    Comment:Patient reports  Lyrica [Pregabalin]    Other-Reaction in Comments    Comment:Patient reports  Omnipaque [Iohexol]    Other-Reaction in Comments    Comment:Patient reports  Prozac [Fluoxetine Hcl]    Other-Reaction in Comments    Comment:Patient reports  Sulfa (Sulfonamide Antibiotics)    Rash  Topiramate    Other-Reaction in Comments    Comment:Hairfall    MEDICATIONS:  Current Outpatient Prescriptions on File Prior to Visit   Medication Sig Dispense Refill    Amlodipine (NORVASC) 10 mg Tablet TAKE 1 TABLET BY MOUTH EVERY DAY. 90 tablet 1    Aspirin 81 mg DR Tablet EC Tablet Take 1  tablet by mouth every day. 30 tablet 5    Ferrous Gluconate 324 mg (38 mg iron) Tablet Take 1 tablet by mouth 3 times daily. 90 tablet 11    Fluorouracil (EFUDEX) 5 % Cream Apply to the affected area 2 times daily. Apply to lesions as directed on nose twice daily for 2-3 weeks 40 g 0    FLUTICASONE/SALMETEROL (ADVAIR DISKUS INHA) Take  by inhalation.      Metformin (GLUCOPHAGE) 1,000 mg tablet Take 1 tablet by mouth 2 times daily with meals. (diabetes) 180 tablet 3    Omeprazole (PRILOSEC) 20 mg Delayed Release Capsule Take 1 capsule by mouth two times daily before meals. 60 capsule 0    ONETOUCH ULTRA TEST Strips USE FOR TESTING BLOOD GLUCOSE 3 TIMES PER DAY 300 strip 11    Sucralfate (CARAFATE) 100 mg/mL Liquid Take 10 mL by mouth 2 times daily. 600 mL 3    Venlafaxine ([name]) 75 mg Tablet Take 1 tablet by mouth 3 times daily. 90 tablet 2     No current facility-administered medications on file prior to visit.        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  Eyes: negative.  Ears, Nose, Mouth, Throat: negative.  CV: negative.  Resp: negative.  GI: vomiting, nausea.  GU:  negative.  Musculoskeletal: negative.  Integumentary: negative.  Neuro: negative.  Psych: Mood pt's report, euthymic.  Endo: negative.  Heme/Lymphatic: negative.  Allergy/Immun: negative.     PHYSICAL EXAMINATION:  Temp: 36.1 C (97 F) (11/03 1317)  Temp src: Temporal (11/03 1317)  Pulse: 109 (11/03 1317)  BP: 122/76 (11/03 1317)  Resp: 16 (11/03 1317)  SpO2: 97 % (11/03 1317)  Height: 157.5 cm (5' 2" ) (11/03 1317)  Weight: 82.1 kg (181 lb) (11/03 1317)    GENERAL: well appearing, well nourished.  HEENT: Head is normocephalic and atraumatic, EOMI.  NECK: Supple, no lymphadenopathy.  LUNGS: Clear to auscultation.  HEART: Regular rate and rhythm without murmur.  ABDOMEN: Soft, nontender, and nondistended.  Normal bowel sounds.  No hepatosplenomegaly was noted.  EXTREMITIES: Without any cyanosis, clubbing, or edema.  NEUROLOGIC: Alert and oriented x4, no asterixis  PSYCHIATRIC: Normal mood and affect.  SKIN: No rashes seen.    LABORATORY EXAMINATIONS:   Lab Results   Lab Name Value Date/Time    NA 141 02/29/2016 10:31 AM    NA 142 08/30/2015 08:35 AM    K 4.1 02/29/2016 10:31 AM    K 4.0 08/30/2015 08:35 AM    CL 103 02/29/2016 10:31 AM    CL 104 08/30/2015 08:35 AM    CO2 28 02/29/2016 10:31 AM    CO2 22 (L) 08/30/2015 08:35 AM    BUN 21 02/29/2016 10:31 AM    BUN 19 08/30/2015 08:35 AM    CR 1.10 02/29/2016 10:31 AM    CR 0.88 08/30/2015 08:35 AM    GLU 133 (H) 02/29/2016 10:31 AM    GLU 139 (H) 08/30/2015 08:35 AM     Lab Results   Lab Name Value Date/Time    WBC 7.3 03/06/2016 08:54 AM    WBC 8.9 08/30/2015 08:35 AM    HGB 10.5 (L) 03/06/2016 08:54 AM    HGB 14.4 08/30/2015 08:35 AM    HCT 32.2 (L) 03/06/2016 08:54 AM    HCT 44.6 08/30/2015 08:35 AM    PLT 273 03/06/2016 08:54 AM    PLT 217 08/30/2015 08:35 AM     Lab Results  Lab Name Value Date/Time    AST 44 (H) 02/29/2016 10:31 AM    AST 71 (H) 08/30/2015 08:35 AM    ALT 46 02/29/2016 10:31 AM    ALT 69 (H) 08/30/2015 08:35 AM    ALP 100 02/29/2016 10:31 AM     ALP 104 08/30/2015 08:35 AM    ALB 3.8 02/29/2016 10:31 AM    ALB 4.2 08/30/2015 08:35 AM    TP 7.6 02/29/2016 10:31 AM    TP 8.5 (H) 08/30/2015 08:35 AM    TBIL 0.5 02/29/2016 10:31 AM    TBIL 0.9 08/30/2015 08:35 AM     Lab Results   Component Value Date    INR 1.00 01/08/2016     Lab Results   Component Value Date    VITD25 36.7 08/30/2015       VIRAL SEROLOGIES:  No results found for this or any previous visit.  Lab Results   Component Value Date    HBSAG Nonreactive 12/08/2013     Lab Results   Component Value Date    HEPBABQT 153.46 12/08/2013     No results found for: ANTIHBCT  No results found for this or any previous visit.  No results found for this or any previous visit.  No results found for this or any previous visit.    Lab Results   Component Value Date    HEPCABSCR Nonreactive 12/08/2013     No results found for this or any previous visit.  No results found for this or any previous visit.  No results found for: HIV1AND2SCR    AUTOIMMUNE:  No results found for: ANASCREEN  No results found for: IGG  No results found for this or any previous visit.  No results found for this or any previous visit.    OTHER:  Lab Results   Component Value Date    FRTN 15 02/29/2016     No results found for this or any previous visit.  No results found for this or any previous visit.    STUDIES:  Colonoscopy 2015  DIAGNOSIS                  A.  CECUM, POLYP (SNARE POLYPECTOMY):       -  Fragments of tubular adenoma (see Comment)       -  No high-grade dysplasia or malignancy        B.  RECTUM, ULCER (BIOPSY):       -  Colonic mucosa with focal erosion and reactive epithelial changes       -  No dysplasia or malignancy       -  Deeper levels examined     EGD 02/2016   A.  DUODENUM, POLYP (BIOPSY):       -  Well differentiated neuroendocrine tumor, Grade 1, transected, see         comment       -  Maximum size: 7.5 mm (measured on slide)       -   Ki67 <2%       -  Synaptophysin positive        Comment: This case has been reviewed in intradepartmental consultation.        Assessment and Plan:  1. NASH: based on Fibroscan, she has F3 disease however, this is not the gold standard.  I have told her that if the Fibroscan result is true, she will almost certainly develop cirrhosis in her lifetime.  Liver  biopsy can be done to confirm this result, but she states that she will be aggressive regarding her lifestyle modifications.  Thus, we have decided to not pursue liver biopsy at this time as this would not necessarily change her management.    2. N/V: unclear etiology.  EGD, colonoscopy, and cross sectional imaging has not shown a cause.  I believe that it may be anxiety related so I will start zofran ODT as needed.    3. Carcinoid: no evidence of residual disease.  Surveillance EGD has been ordered.  This will be done prior to the new year.         Orders Placed This Encounter    Ondansetron (ZOFRAN ODT) 8 mg disintegrating tablet       Return in about 3 months (around 06/07/2016).    Georgeanne Nim MD, MPH  Assistant Professor of Internal Medicine  Division of Gastroenterology and Hepatology  [address], [id number], [place], [place] [address]  Tel: [phone]  Fax: [phone]  [email or web address]

## 2016-03-17 ENCOUNTER — Ambulatory Visit: Payer: Medicare Other | Attending: Family Medicine

## 2016-03-17 DIAGNOSIS — E119 Type 2 diabetes mellitus without complications: Secondary | ICD-10-CM

## 2016-03-17 DIAGNOSIS — E1121 Type 2 diabetes mellitus with diabetic nephropathy: Secondary | ICD-10-CM

## 2016-03-17 DIAGNOSIS — E559 Vitamin D deficiency, unspecified: Secondary | ICD-10-CM | POA: Insufficient documentation

## 2016-03-17 DIAGNOSIS — K922 Gastrointestinal hemorrhage, unspecified: Secondary | ICD-10-CM

## 2016-03-17 LAB — CBC WITH DIFFERENTIAL
Basophils % Auto: 0.6 %
Basophils Abs Auto: 0 10*3/uL (ref 0.0–0.2)
Eosinophils % Auto: 3.9 %
Eosinophils Abs Auto: 0.3 10*3/uL (ref 0.0–0.5)
Hematocrit: 34 % — ABNORMAL LOW (ref 36.0–46.0)
Hemoglobin: 11.2 g/dL — ABNORMAL LOW (ref 12.0–16.0)
Lymphocytes % Auto: 23.6 %
Lymphocytes Abs Auto: 1.7 10*3/uL (ref 1.0–4.8)
MCH: 28.3 pg (ref 27.0–33.0)
MCHC: 32.8 % (ref 32.0–36.0)
MCV: 86.3 UM3 (ref 80.0–100.0)
MPV: 8.2 UM3 (ref 6.8–10.0)
Monocytes % Auto: 7.1 %
Monocytes Abs Auto: 0.5 10*3/uL (ref 0.1–0.8)
Neutrophils % Auto: 64.8 %
Neutrophils Abs Auto: 4.8 10*3/uL (ref 1.8–7.7)
Platelet Count: 245 10*3/uL (ref 130–400)
RDW: 13.7 units (ref 0.0–14.7)
Red Blood Cell Count: 3.94 10*6/uL — ABNORMAL LOW (ref 4.00–5.20)
White Blood Cell Count: 7.4 10*3/uL (ref 4.5–11.0)

## 2016-03-17 LAB — VITAMIN D, 25 HYDROXY: Vitamin D, 25 Hydroxy: 34.4 ng/mL (ref 20.0–50.0)

## 2016-03-17 LAB — HEMOGLOBIN A1C
Hgb A1C,Glucose Est Avg: 128 mg/dL
Hgb A1C: 6.1 % — ABNORMAL HIGH (ref 3.9–5.6)

## 2016-03-17 LAB — VITAMIN B12: Vitamin B12: 197 pg/mL — ABNORMAL LOW (ref 213–816)

## 2016-03-19 ENCOUNTER — Ambulatory Visit: Payer: Self-pay | Admitting: Family Medicine

## 2016-03-19 ENCOUNTER — Encounter: Payer: Self-pay | Admitting: GASTROENTEROLOGY

## 2016-03-19 ENCOUNTER — Encounter: Payer: Self-pay | Admitting: Family Medicine

## 2016-03-19 DIAGNOSIS — Z79899 Other long term (current) drug therapy: Secondary | ICD-10-CM | POA: Insufficient documentation

## 2016-03-19 NOTE — Telephone Encounter (Signed)
PA please for Ondansetron (ZOFRAN ODT) 8 mg disintegrating tablet.

## 2016-03-19 NOTE — Telephone Encounter (Signed)
From: [name]  To: [name], MD  Sent: 03/18/2016 9:07 PM PST  Subject: Visit Follow-up Question    Hi Dr, just wondering if I need to still have my blood drawn every week?

## 2016-03-20 NOTE — Telephone Encounter (Signed)
PA Approved until 04/18/16 pharmacy and patient will be notified

## 2016-03-24 NOTE — Progress Notes (Signed)
Endocrinology Follow up clinic note:    Chief Complaint:  "I'm here to follow up on my diabetes."    HPI: Paula Daugherty is a 70yrold female who has a diagnosis of type 2 diabetes mellitus who presents to Endocrinology for follow up. Her history is as follows:  She was initially diagnosed with diabetes around 2000 on routine labs. She has a strong family history of diabetes so she wasn't surprised at this. She was started on Metformin around 2005 and then she was started on insulin in 2014. She was up to 64 units of Lantus at night. However, after making changes to her diet and lifestyle, she was taken off insulin in 2015.        She also has a history of an elevated alk phos level. A GGT level was checked and was found to be elevated. A bone specific alkaline phosphatase was also found to be elevated. PTH level was normal. Alk phos level subsequently normalized.     Interim History: She returns today for follow up. Her last visit with Endocrinology was on 10/30/2015. Since this visit, she has been dealing with many health issues. She had an upper endoscopy done for chronic abdominal pain and had a biopsy taken which was found to be consistent with a neuroendocrine tumor. Post-procedure was complicated by some bleeding for which [facility].   For the neuroendocrine tumor, she had a PET Ct scan which did not demonstrate any other areas of malignancy and she was seen by heme/onc who discussed active monitoring.   She states that with the stress of all her health issues, her blood glucose has been running higher overall. She is hoping to get back into a more stable routine now.    Current Regimen:                          Metformin 1000 mg BID                        Glipizide 10 mg daily  Hypoglycemia: Almost never recently  Hyperglycemia: Yes, has been running higher overall  Meter brought today: Yes  Checks blood glucose: 1-2x/day (morning)  Home blood glucose numbers per pt's  log:    Blood Glucose:                Pre-breakfast: 169 to 242   Bedtime: 166 to 260  Last eye exam: 04/2015, no retinopathy, f/u 1 year  Foot exam: Will do today    All other systems were reviewed and are negatative except for pertinent positive and negative responses as documented in HPI.     Medications:  Medication reconciliation was performed today.   Outpatient Prescriptions Marked as Taking for the 03/07/16 encounter (Office Visit) with DCharlane Ferretti MD   Medication Sig Dispense Refill    Amlodipine (NORVASC) 10 mg Tablet TAKE 1 TABLET BY MOUTH EVERY DAY. 90 tablet 1    Aspirin 81 mg DR Tablet EC Tablet Take 1 tablet by mouth every day. 30 tablet 5    Atorvastatin (LIPITOR) 10 mg Tablet Take 1 tablet by mouth every day at bedtime. 90 tablet 3    Ferrous Gluconate 324 mg (38 mg iron) Tablet Take 1 tablet by mouth 3 times daily. 90 tablet 11    GlipiZIDE (GLUCOTROL XL) 10 mg SR Tablet Take 1 tablet by mouth every morning with  a meal. (diabetes) 30 tablet 5    Losartan (COZAAR) 25 mg Tablet Take 1 tablet by mouth every day. 90 tablet 3    Metformin (GLUCOPHAGE) 1,000 mg tablet Take 1 tablet by mouth 2 times daily with meals. (diabetes) 180 tablet 3    Omeprazole (PRILOSEC) 20 mg Delayed Release Capsule Take 1 capsule by mouth two times daily before meals. 60 capsule 0    ONETOUCH ULTRA TEST Strips USE FOR TESTING BLOOD GLUCOSE 3 TIMES PER DAY 300 strip 11    Sucralfate (CARAFATE) 100 mg/mL Liquid Take 10 mL by mouth 2 times daily. 600 mL 3    Venlafaxine ([name]) 75 mg Tablet Take 1 tablet by mouth 3 times daily. 90 tablet 2       I did review patient's past medical and family/social history, no changes noted.   PMH:  Past medical history was reviewed from problem list.   Patient Active Problem List   Diagnosis    DM2 (diabetes mellitus, type 2)    Depression with anxiety    Stress at home    Leg cramps-right calf    Right ear pain    Fibromyalgia    HTN (hypertension)    GERD  (gastroesophageal reflux disease)    Hyperlipidemia with target LDL less than 70    Vitamin D insufficiency    Abnormal LFTs    Renal cyst ( 6.4 cm cyst left kidney)    Cough    Type 2 diabetes mellitus without complication    Fatty liver    Macroalbuminuric diabetic nephropathy    History of actinic keratoses    Cataract    Ptosis of eyelid    Liver fibrosis    Adrenal nodule    Medication Therapy Auth       VITAL SIGNS:  BP 135/80  Pulse 103  Temp 36.6 C (97.8 F) (Temporal)  Resp 16  Wt 82.1 kg (181 lb)  LMP 05/06/1983  BMI 32.06 kg/m2  Body mass index is 32.06 kg/(m^2).    PHYSICAL EXAM:  General Appearance: healthy, alert, no distress, pleasant affect, cooperative.  Eyes:  conjunctivae and corneas clear.  EOM's intact. sclerae normal.  Mouth: normal.  Heart:  normal rate and regular rhythm, no murmurs, clicks, or gallops.  Lungs: clear to auscultation.  Abdomen: BS normal.  Abdomen soft, mildly tender to palpation with no rebound or guarding.  No masses or organomegaly.  Extremities:  no cyanosis, clubbing, or edema.  Skin:  Skin color, texture, turgor normal. No rashes or lesions.  Neuro: Gait normal. No tremor appreciated. Sensation and strength grossly normal.  Mental Status: Appearance/Cooperation: in no apparent distress and well developed and well nourished  Eye Contact: normal  Speech: normal volume, rate, and pitch    LAB TESTS/STUDIES:   I personally reviewed the following laboratory and imaging studies.     02/29/2016 10:31 02/29/2016 10:31   SODIUM 141    POTASSIUM 4.1    CHLORIDE 103    CARBON DIOXIDE TOTAL 28    UREA NITROGEN, BLOOD (BUN) 21    CREATININE BLOOD 1.10    GLUCOSE 133 (H)    CALCIUM 9.4    PROTEIN 7.6    ALBUMIN 3.8    ALKALINE PHOSPHATASE (ALP) 100    ASPARTATE TRANSAMINASE (AST) 44 (H)    BILIRUBIN TOTAL 0.5    ALANINE TRANSFERASE (ALT) 46    E-GFR, AFRICAN AMERICAN 60    E-GFR, NON-AFRICAN AMERICAN 52    IRON TOTAL  25 (L) 25 (L)   TRANSFERRIN 272    TOTAL IRON  BINDING CAPACITY 378    IRON PERCENT SATURATION 6.6 (L)    FERRITIN 15    GASTRIN, SERUM 58      Lab Results   Lab Name Value Date/Time    HGBA1C 6.1 (H) 03/17/2016 10:32 AM    HGBA1C 6.7 (H) 12/11/2015 08:49 AM    HGBA1C 6.6 (H) 08/30/2015 08:35 AM    HGBA1C 7.7 (H) 03/23/2015 09:04 AM    HGBA1C 6.9 (H) 12/06/2014 11:45 AM     Lab Results   Lab Name Value Date/Time    CHOL 158 08/30/2015 08:35 AM    LDLC 74 08/30/2015 08:35 AM    HDL 58 08/30/2015 08:35 AM    TRIG 132 08/30/2015 08:35 AM      02/29/2016 10:31   WHITE BLOOD CELL COUNT 9.5   HEMOGLOBIN 10.3 (L)   HEMATOCRIT 30.1 (L)   MCV 88.5   RDW 13.1   PLATELET COUNT 357     01/08/2016:   Ref Range & Units 48moago     TOTAL VOLUME mL 1625   TIME OF COLLECTION hr 24   DOPAMINE,UR PER VOLUME ug/L 91   DOPAMINE,UR PER 24HR 77 - 324 ug/d 148   DOPAMINE,UR RATIO TO CRT 0 - 250 ug/g CRT 147   NOREPINEPHRINE,UR PER VOLUME ug/L 43   NOREPINEPHRINE,UR PER 24HR 16 - 71 ug/d 70   NOREPINEPHRINE,UR RATIO TO CRT 0 - 45 ug/g CRT 69 (H)   EPINEPHRINE,UR PER VOLUME ug/L 1   EPINEPHRINE,UR PER 24 HR 1 - 7 ug/d 2   EPINEPHRINE,UR RATIO TO CRT 0 - 20 ug/g CRT 2        Ref Range & Units 260mogo     TOTAL VOLUME mL 1625   TIME OF COLLECTION hr 24   CORTISOL,UR FREE PER VOLUME ug/L 8.46   CORTISOL,UR FREE PER 24HR <=45.0 ug/d 13.7   CORTISOL,UR FREE RATIO TO CRT ug/g CRT 13.65        Ref Range & Units 85m35moo     TOTAL VOLUME mL 1625   TIME OF COLLECTION hr 24   METANEPHRINE,UR-PER VOLUME ug/L 71   METANEPHRINE,UR-PER 24HR 39 - 143 ug/d 115   METANEPHRINE,UR-RATIO TO CRT 0 - 300 ug/g CRT 115   NORMETANEPHRINE,UR-PER VOLUME ug/L 450   NORMETANEPHRINE,UR-PER 24HR 109 - 393 ug/d 731 (H)   NORMETANEPHRIN,UR-RATIO TO CRT 0 - 400 ug/g CRT 726 (H)        Ref Range & Units 85mo13mo     Urine Volume mL 1625   START DATE  01/07/2016   START TIME  0838   STOP DATE  01/08/2016   STOP TIME  0838   COLLECTION INTERVAL, URINE H 24.00   Creatinine Conc mg/dL 68.23   Creatinine 24 Hr 800 - 2000 mg/24Hr  1109     CT Abdomen and Pelvis (12/28/2015):  FINDINGS:  LOWER CHEST  Unremarkable.    ABDOMEN AND PELVIS  Liver: Unremarkable appearance of the liver with inability to assess for  hepatic steatosis on CT. A few subcentimeter hypoattenuating lesions in  right hepatic dome are too small to characterize.  Gallbladder: Unremarkable.  Pancreas: Unremarkable.  Spleen: Unremarkable.  Adrenals: Right adrenal gland is unremarkable. Left adrenal gland nodule  measuring up to 1.4 cm, statistically likely adenoma.  Kidneys: 1.7 cm partially exophytic cyst within midpole of left kidney.  Additional hypodensities are too small to  characterize. No hydronephrosis.  Bladder: Decompressed and poorly evaluated.  Reproductive Organs: Status post hysterectomy. No adnexal masses.  Bowel: Colonic diverticulosis without adjacent inflammatory change. No  abnormal bowel wall thickening or dilatation.  Nodes: Mildly prominent, nonspecific porta hepatis lymph nodes measuring up  to 12 mm.  Vessels: Mild aortoiliac atherosclerosis.  Other: No free fluid.    BONES AND SUBCUTANEOUS SOFT TISSUES  Soft tissue: Unremarkable.  Bones: Mild degenerative changes. No acute bony abnormality.    IMPRESSION:  1. No acute intra-abdominal process.  2. No definite CT evidence of liver disease with inability to assess  for hepatic steatosis on CT. Given discrepancy with previous ultrasound  study of 10/31/2015 recommend dedicated MRI elastography for further  evaluation of fibrosis.  3. A 1.4 cm left adrenal nodule is indeterminate but statistically  highly likely benign if the patient has no known malignancy. Consider  follow up with adrenal protocol CT in 12 months. Hormonal evaluation is  recommended. Consider endocrinology referral    DXA (10/26/2015):  FINDINGS:  Anatomic Location: AP LUMBAR SPINE  BMD (gm/cm2): 1.032   SD above (+)/below, (-) T score: -0.1   SD above (+)/below, (-) Z score: 1.8    Anatomic Location: LEFT FEMORAL NECK   BMD (gm/cm2): 0.585    SD above (+)/below, (-) T score: -2.4   SD above (+)/below, (-) Z score: -0.7    Anatomic Location: LEFT TOTAL HIP  BMD (gm/cm2): 1.100   SD above (+)/below, (-) T score: 1.3   SD above (+)/below, (-) Z score: 2.7     Impression: This is a 70yrold female with Diabetes Mellitus Type II who presents to Endocrinology for follow up. Overall, her glycemic control near goal as based on her most recent A1c of 6.7%. It is possible that this level is now higher with her recent hyperglycemia and she will have a repeat A1c level drawn next month. We discusse lifestyle modifications and will need to consider the addition of another medication to her regimen should her blood sugar not improve with these changes alone.     She also has a history of an elevated alk phos level. GGT was elevated which suggests a liver source and she was found to have an enlarged liver with increased echogenicity suggestive of fatty infiltration on abdominal ultrasound in 12/2013. Her last alk phos in 02/2016 was in the normal range. Fibroscan is consistent with NASH.     DIABETES HISTORY  (-) h/o retinopathy.      (-) h/o microalbuminuria/nephropathy.  (-) h/o neuropathy.  (-) h/o Autonomic dysfunction  (-) h/o Gastroparesis  (-) h/o CAD  (-) h/o PVD     Last eye exam: 04/2015, f/u 1 year   Last urine microalbumin: 08/2015, 1283  Pneumovax: 12/2014  (+) ASA  (+) Statin  (+) ACE/ARB             Recommendations:  (E11.29) Type 2 diabetes mellitus with other diabetic kidney complication  (primary encounter diagnosis)  - No changes made to medications today   - Discussed dietary modifications for improved glycemic control   - Encouraged patient to continue blood glucose monitoring to at least 1x/day, goal to check 2x/day  - Last LDL at goal, continue statin, repeat fasting lipid panel due 08/2016  - Blood pressure at goal, continue amlodipine, cozaar    - Last microalbumin:creatinine ratio elevated, continue cozaar  - Up to date on screening eye exam,  next due 04/2016, f/u with ophthalmology as scheduled  -  Check A1c next month  - Check vitamin B12 level now given long-standing metformin use and symptoms of fatigue    2. Adrenal nodule  - Biochemical evaluation normal, repeat due in 01/2017 (~1 year from previous)  - Repeat imaging in ~12/2016 (~1 year from previous)    Approximately 30 minutes were spent with patient, greater than 50% of which was spent counseling the patient on diabetes and on coordination of care.     Follow up in 3 months    If you have any questions, please do not hesitate to contact me at [phone].  Thank you for allowing me to participate in the care of this patient.    EDUCATION:  I educated/instructed the patient or caregiver regarding all aspects of the above stated plan of care.  The patient or caregiver indicated understanding.      Iva interpreter was not used.    Report electronically signed by:  Sunday Spillers, M.D.  Clinical Assistant Professor  Department of Endocrinology  Pager # [phone]

## 2016-03-31 ENCOUNTER — Ambulatory Visit: Payer: Medicare Other | Attending: Family Medicine

## 2016-03-31 ENCOUNTER — Telehealth: Payer: Self-pay | Admitting: Family Medicine

## 2016-03-31 DIAGNOSIS — K922 Gastrointestinal hemorrhage, unspecified: Secondary | ICD-10-CM

## 2016-03-31 LAB — CBC WITH DIFFERENTIAL
Basophils % Auto: 1 %
Basophils Abs Auto: 0.1 10*3/uL (ref 0.0–0.2)
Eosinophils % Auto: 3.1 %
Eosinophils Abs Auto: 0.3 10*3/uL (ref 0.0–0.5)
Hematocrit: 37.8 % (ref 36.0–46.0)
Hemoglobin: 12.5 g/dL (ref 12.0–16.0)
Lymphocytes % Auto: 21.7 %
Lymphocytes Abs Auto: 2 10*3/uL (ref 1.0–4.8)
MCH: 28.2 pg (ref 27.0–33.0)
MCHC: 33.1 % (ref 32.0–36.0)
MCV: 85.4 UM3 (ref 80.0–100.0)
MPV: 8.2 UM3 (ref 6.8–10.0)
Monocytes % Auto: 6.9 %
Monocytes Abs Auto: 0.6 10*3/uL (ref 0.1–0.8)
Neutrophils % Auto: 67.3 %
Neutrophils Abs Auto: 6.3 10*3/uL (ref 1.8–7.7)
Platelet Count: 261 10*3/uL (ref 130–400)
RDW: 13.5 units (ref 0.0–14.7)
Red Blood Cell Count: 4.43 10*6/uL (ref 4.00–5.20)
White Blood Cell Count: 9.3 10*3/uL (ref 4.5–11.0)

## 2016-03-31 NOTE — Telephone Encounter (Signed)
I would recommend getting standing CBC now and will route to Dr Virgel Bouquet.   Please call or make an appointment with any questions or concerns or worsening symptoms by calling [phone].

## 2016-03-31 NOTE — Telephone Encounter (Signed)
Paula Daugherty is a 70yrold female  3 patient identifiers used    Spoke to patient, advised of Dr. JGunnar Bullamessage.  Patient verbalizes understanding, agrees with plan, stating she will head to lab now to have CBC done.  Patient denies any further questions or concerns at this time.     Thank you  AAdriana Simas RN  PCN Triage

## 2016-03-31 NOTE — Telephone Encounter (Signed)
Paula Daugherty is a 70yrold female  3 patient identifiers used.    Per:   patient    Reason for Call:  Medication problem, black stools    Symptoms:    Patient reports in October endoscopy, found polyp, clamp didn't hold, [facility] bleed[facility]  Patient reports developed anemia, started Ferrous Gluconate 324 mg (38 mg iron) Tablet.  Patient reports back to having black stools for last 3-4 days, 6 episodes per day, very unusual for her to have frequent bowel movements, and unsure if from iron or bleed.  Patient reports moderate abdominal pain on and off, episode this weekend.  Patient reports also had another episode of unexplained vomiting over weekend, denies blood or coffee ground like substance.  Patient reports some dizziness intermittently, tired and headache yesterday.  Patient denies any bowel movement or vomiting today.  Patient denies rapid heartbeat.  Patient asking if she needs a CBC or any labs.      Homecare and/or Medications given:  Observation     Advice:  Advised patient PCP will be in clinic this afternoon and I would send an urgent message and call back.  Advised ER with worsening symptoms.    C. Seek Emergency Care Immediately If Any of the Following Occur   1. Lightheadedness or fainting   2. Intermittent abdominal pain   3. Large amounts of bright red blood mixed with stool or passing blood clots   4. Vomiting blood or coffee-ground like material   5. Lightheadedness or fainting   6. Frequent black tarry stools     Pain: yes    Pain location and 1-10:  Abdominal, intermittent           Disposition: Consult with MD  Please advise.  Patient asking if she should have labs done today?  Appointment or ER recommended today?     Thank you.      Per:   Patient verbalizes agreement to plan. Agrees to callback with any increase in symptoms/concerns or questions.     AAdriana SimasRN  PCN Triage

## 2016-04-17 ENCOUNTER — Ambulatory Visit: Payer: Medicare Other | Admitting: Family Medicine

## 2016-04-17 ENCOUNTER — Encounter: Payer: Self-pay | Admitting: Family Medicine

## 2016-04-17 VITALS — BP 133/71 | HR 91 | Temp 97.9°F | Wt 181.4 lb

## 2016-04-17 DIAGNOSIS — K7400 Hepatic fibrosis, unspecified: Secondary | ICD-10-CM

## 2016-04-17 DIAGNOSIS — C7A8 Other malignant neuroendocrine tumors: Secondary | ICD-10-CM

## 2016-04-17 DIAGNOSIS — E1121 Type 2 diabetes mellitus with diabetic nephropathy: Secondary | ICD-10-CM

## 2016-04-17 DIAGNOSIS — J0100 Acute maxillary sinusitis, unspecified: Secondary | ICD-10-CM

## 2016-04-17 HISTORY — DX: Other malignant neuroendocrine tumors: C7A.8

## 2016-04-17 MED ORDER — AMOXICILLIN 500 MG TABLET
1.0000 | ORAL_TABLET | Freq: Three times a day (TID) | ORAL | 0 refills | Status: AC
Start: 2016-04-17 — End: 2016-04-27

## 2016-04-17 NOTE — Nursing Note (Signed)
Verified patient by name and date of birth. Took patient vital signs. Reviewed and updated the following: allergies, smoking history, pharmacy verified and screened for pain. [place], [place] 04/17/2016 10:44 AM

## 2016-04-17 NOTE — Patient Instructions (Addendum)
Sinusitis: Care Instructions  Your Care Instructions     Sinusitis is an infection of the lining of the sinus cavities in your head. Sinusitis often follows a cold. It causes pain and pressure in your head and face.  In most cases, sinusitis gets better on its own in 1 to 2 weeks. But some mild symptoms may last for several weeks. Sometimes antibiotics are needed.  Follow-up care is a key part of your treatment and safety. Be sure to make and go to all appointments, and call your doctor if you are having problems. It's also a good idea to know your test results and keep a list of the medicines you take.  How can you care for yourself at home?   Take an over-the-counter pain medicine, such as acetaminophen (Tylenol), ibuprofen (Advil, Motrin), or naproxen (Aleve). Read and follow all instructions on the label.   If the doctor prescribed antibiotics, take them as directed. Do not stop taking them just because you feel better. You need to take the full course of antibiotics.   Be careful when taking over-the-counter cold or flu medicines and Tylenol at the same time. Many of these medicines have acetaminophen, which is Tylenol. Read the labels to make sure that you are not taking more than the recommended dose. Too much acetaminophen (Tylenol) can be harmful.   Breathe warm, moist air from a steamy shower, a hot bath, or a sink filled with hot water. Avoid cold, dry air. Using a humidifier in your home may help. Follow the directions for cleaning the machine.   Use saline (saltwater) nasal washes to help keep your nasal passages open and wash out mucus and bacteria. You can buy saline nose drops at a grocery store or drugstore. Or you can make your own at home by adding 1 teaspoon of salt and 1 teaspoon of baking soda to 2 cups of distilled water. If you make your own, fill a bulb syringe with the solution, insert the tip into your nostril, and squeeze gently. Blow your nose.   Put a hot, wet towel or a warm  gel pack on your face 3 or 4 times a day for 5 to 10 minutes each time.   Try a decongestant nasal spray like oxymetazoline (Afrin). Do not use it for more than 3 days in a row. Using it for more than 3 days can make your congestion worse.  When should you call for help?  Call your doctor now or seek immediate medical care if:   You have new or worse swelling or redness in your face or around your eyes.   You have a new or higher fever.  Watch closely for changes in your health, and be sure to contact your doctor if:   You have new or worse facial pain.   The mucus from your nose becomes thicker (like pus) or has new blood in it.   You are not getting better as expected.   Where can you learn more?   Go to https://www.healthwise.net/patiented  Enter I933 in the search box to learn more about "Sinusitis: Care Instructions."    2006-2015 [employer]. Care instructions adapted under license by [facility]. This care instruction is for use with your licensed healthcare professional. If you have questions about a medical condition or this instruction, always ask your healthcare professional. Healthwise, Incorporated disclaims any warranty or liability for your use of this information.  Content Version: 10.6.465758; Current as of: March 18, 2013

## 2016-04-17 NOTE — Progress Notes (Signed)
Chief Complaint   Patient presents with    Sinus Problem       Subjective:  Paula Daugherty is a 70yrold female who presents with the following chief complaint:     She reports a 3 day history of left maxillary sinus pain, nasal congestion with thick discolored secretions, post nasal drip and cough.  Pain with chewing in teeth / jaw. Improved with hot / cold application.  Difficulty with sleeping.  No fevers, chills, sweats.    Past history of sinusitis: yes - treated with Amoxicillin.    Multiple co-morbid conditions including type 2 diabetes, liver cirrhosis, carcinoid tumor of duodenum.      History   Smoking Status    Former Smoker    Packs/day: 0.10    Years: 20.00   Smokeless Tobacco    Former [employer] developer    Comment: quit 30 years ago        Current Outpatient Prescriptions on File Prior to Visit   Medication Sig Dispense Refill    Amlodipine (NORVASC) 10 mg Tablet TAKE 1 TABLET BY MOUTH EVERY DAY. 90 tablet 1    Aspirin 81 mg DR Tablet EC Tablet Take 1 tablet by mouth every day. 30 tablet 5    Atorvastatin (LIPITOR) 10 mg Tablet Take 1 tablet by mouth every day at bedtime. 90 tablet 3    Ferrous Gluconate 324 mg (38 mg iron) Tablet Take 1 tablet by mouth 3 times daily. 90 tablet 11    Fluorouracil (EFUDEX) 5 % Cream Apply to the affected area 2 times daily. Apply to lesions as directed on nose twice daily for 2-3 weeks 40 g 0    FLUTICASONE/SALMETEROL (ADVAIR DISKUS INHA) Take  by inhalation.      GlipiZIDE (GLUCOTROL XL) 10 mg SR Tablet Take 1 tablet by mouth every morning with a meal. (diabetes) 30 tablet 5    Losartan (COZAAR) 25 mg Tablet Take 1 tablet by mouth every day. 90 tablet 3    Metformin (GLUCOPHAGE) 1,000 mg tablet Take 1 tablet by mouth 2 times daily with meals. (diabetes) 180 tablet 3    Omeprazole (PRILOSEC) 20 mg Delayed Release Capsule Take 1 capsule by mouth two times daily before meals. 60 capsule 0    Ondansetron (ZOFRAN ODT) 8 mg  disintegrating tablet Take 1 tablet by mouth every 8 hours if needed. 50 tablet 1    ONETOUCH ULTRA TEST Strips USE FOR TESTING BLOOD GLUCOSE 3 TIMES PER DAY 300 strip 11    Sucralfate (CARAFATE) 100 mg/mL Liquid Take 10 mL by mouth 2 times daily. 600 mL 3    Venlafaxine ([name]) 75 mg Tablet Take 1 tablet by mouth 3 times daily. 90 tablet 2     No current facility-administered medications on file prior to visit.        BP 133/71  Pulse 91  Temp 36.6 C (97.9 F) (Temporal)  Wt 82.3 kg (181 lb 7 oz)  LMP 05/06/1983  BMI 33.19 kg/m2  OBJECTIVE:  Gen: Well nourished, well developed in no distress.   Eyes: PERRLA, no injection or tearing.   Ears: Normal.   Nose: Congested, clear rhinorrhea.   Sinuses: left maxillary sinus tender.    Throat: Tonsils are normal.  Pharynx: Mild cobblestoning with injection.   Neck: Supple. Mild anterior adenopathy.   Chest: Clear, without wheezes or rales.    ASSESSMENT:     ICD-10-CM    1. Acute non-recurrent maxillary sinusitis J01.00 Amoxicillin (AMOXIL) 500 mg  Tablet   2. Type 2 diabetes mellitus with diabetic nephropathy, without long-term current use of insulin E11.21    3. Liver fibrosis K74.0    4. 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 C7A.8      51yrfemale with 3 day history of acute maxillary sinus infection with  multiple co-morbid conditions.    PLAN:  Discussed diagnosis, course, treatment, management.  Discussed pertinent anatomy and physiology at length with patient.  Discussed viral vs bacterial etiology.  Rx Amoxicillin 500 mg TID x 10 days given.  Warned.  Discussed supportive care, nasal steroids / sinus rinse, humidifier, hydration, anti-inflammatories as needed.  Handout given and reviewed - education discussed at length.   Call or return to clinic prn if these symptoms worsen or fail to improve as anticipated.  Follow-up with specialists as scheduled.    Barriers to Learning assessed: none.   Patient verbalizes understanding of teaching and  instructions.    The patient and/or caregiver was educated regarding his/her medical care.   No guarantees were made regarding his/her medical care or treatment outcome.    I reviewed past medical, family/social and medication history during the visit.       Electronically signed by:    AOtis Peak DO  Board-Certified, American Board of Family Medicine  Associate Physician [place]

## 2016-04-18 ENCOUNTER — Other Ambulatory Visit: Payer: Self-pay | Admitting: Neurology

## 2016-04-18 ENCOUNTER — Telehealth: Payer: Self-pay | Admitting: Family Medicine

## 2016-04-18 ENCOUNTER — Ambulatory Visit: Payer: Vision Other Private Insurance | Admitting: Optometrist

## 2016-04-18 NOTE — Telephone Encounter (Signed)
This is a  REFILL AUTHORIZATION for the electronic request for Venlafaxine 75 mg tablets prescribed by Dr. Donnamarie Poag.  Medication refilled per Prescription Refill by Clinic RN Standardized Procedure Policy JW-11.  Last office visit 05/01/15.     [facility] please contact patient to schedule follow up appointment.  Thank you.    Jolyn Nap, RN  Neurology

## 2016-04-18 NOTE — Telephone Encounter (Signed)
Paula Daugherty is a 70yrold female  3 patient identifiers used.    Per:   patient    Reason for Call:  Neurological Problem    Symptoms:    States was seen yesterday for sinus infection. Today left side of face is very swollen, facial droop to left side at mouth, asymmetrical smile.  Face is painful, patient unsure if numbness present. Headache is slightly worse than when seen in clinic yesterday. Is speaking clearly. Denies tingling, drooling, vision problems, rashes, weakness to extremities.. States she has taken this medication before and not had this reaction.     Homecare and/or Medications given: Amoxicillin (AMOXIL) 500 mg Tablet [[[id number]]   Advice:  Advised patientto call 911 or go to nearest emergency room now [facility]. Advised patientof potential risks involved with delaying evaluation and treatment, including possible complicationsor death based on symptoms as related within this triage note    Pain: yes    Pain location and 1-10:  Face, head           Disposition: Referred to ER per [facility], states patient and husband were on the way to clinic for eye appointments[facility]     Per:   patient verbalizes agreement to plan. Agrees to callback with any increase in symptoms/concerns or questions.     RConsuella Lose RN  PCN Triage  [phone]

## 2016-04-30 ENCOUNTER — Encounter: Payer: Self-pay | Admitting: Optometrist

## 2016-04-30 ENCOUNTER — Telehealth: Payer: Self-pay | Admitting: Family Medicine

## 2016-04-30 DIAGNOSIS — H906 Mixed conductive and sensorineural hearing loss, bilateral: Secondary | ICD-10-CM | POA: Insufficient documentation

## 2016-04-30 NOTE — Telephone Encounter (Signed)
Patient called in today requesting a new Referral to ENT to see Dr. Sanjuana Letters [phone], patients prior Referral expired on 09/13/14.

## 2016-04-30 NOTE — Telephone Encounter (Signed)
Referral updated. Betsey Amen, MD

## 2016-05-01 ENCOUNTER — Encounter: Payer: Self-pay | Admitting: GASTROENTEROLOGY

## 2016-05-07 ENCOUNTER — Other Ambulatory Visit: Payer: Self-pay | Admitting: GASTROENTEROLOGY

## 2016-05-07 DIAGNOSIS — D3A00 Benign carcinoid tumor of unspecified site: Secondary | ICD-10-CM

## 2016-05-15 ENCOUNTER — Encounter: Payer: Self-pay | Admitting: GASTROENTEROLOGY

## 2016-05-15 ENCOUNTER — Ambulatory Visit
Admission: RE | Admit: 2016-05-15 | Discharge: 2016-05-15 | Disposition: A | Discharge status: Home / Self Care | Payer: Medicare Other | Source: Ambulatory Visit | Attending: GASTROENTEROLOGY | Admitting: GASTROENTEROLOGY

## 2016-05-15 DIAGNOSIS — D3A00 Benign carcinoid tumor of unspecified site: Secondary | ICD-10-CM

## 2016-05-15 MED ORDER — BARIUM SULFATE 40 % (W/V), 30 % (W/W) ORAL SUSPENSION
480.0000 mL | ORAL | Status: AC
Start: 2016-05-15 — End: 2016-05-15
  Administered 2016-05-15: 480 mL via ORAL

## 2016-05-20 ENCOUNTER — Ambulatory Visit: Payer: Medicare Other | Admitting: Family Medicine

## 2016-05-20 ENCOUNTER — Encounter: Payer: Self-pay | Admitting: Family Medicine

## 2016-05-20 VITALS — BP 139/78 | HR 81 | Temp 98.6°F | Wt 182.1 lb

## 2016-05-20 DIAGNOSIS — E1121 Type 2 diabetes mellitus with diabetic nephropathy: Secondary | ICD-10-CM

## 2016-05-20 DIAGNOSIS — K922 Gastrointestinal hemorrhage, unspecified: Secondary | ICD-10-CM

## 2016-05-20 DIAGNOSIS — H6121 Impacted cerumen, right ear: Secondary | ICD-10-CM

## 2016-05-20 DIAGNOSIS — D485 Neoplasm of uncertain behavior of skin: Secondary | ICD-10-CM

## 2016-05-20 HISTORY — DX: Neoplasm of uncertain behavior of skin: D48.5

## 2016-05-20 NOTE — Patient Instructions (Addendum)
You have been referred to a specialist. We are pleased to offer the services of [facility] [employer] specialists located at the [facility] or [place] sites. Please allow 7 business days (3 if urgent) for the referral to be processed. After that time you may call: Dermatology: [address], [address], [phone]       A laboratory test has been ordered for you.  No appointment or fasting.

## 2016-05-20 NOTE — Nursing Note (Signed)
Vital signs taken, allergies and pharmacy verified. Screened for pain. Med list given to patient for review.

## 2016-05-20 NOTE — Progress Notes (Signed)
Paula Daugherty is a 76yrfemale who presents with a chief complaint of "here for follow-up with PCP".  Chief Complaint   Patient presents with    Other     3 mo f/u     1. Ear wax impacted: right side; previously required seeing ENT for this; patient to consider ENT evaluation if hearing continues decreased   2. Upper GI bleed: after localized biopsy; no current symptoms or stool changes; seeing specialists and feeling positive but still getting head wrapped around it diagnosis of carcinoid tumor   3. Skin lesions: on left knee with increased size; near left eye at cheek and chest; patient has previously seen DR LNigel Bridgemanand she is amenable to see her again for further evaluation       ROS:   .Constitutional: no fever/chills.  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 90 tablet 1    Atorvastatin (LIPITOR) 10 mg Tablet Take 1 tablet by mouth every day at bedtime. 90 tablet 3    Fluorouracil (EFUDEX) 5 % Cream Apply to the affected area 2 times daily. Apply to lesions as directed on nose twice daily for 2-3 weeks 40 g 0    FLUTICASONE/SALMETEROL (ADVAIR DISKUS INHA) Take  by inhalation.      GlipiZIDE (GLUCOTROL XL) 10 mg SR Tablet Take 1 tablet by mouth every morning with a meal. (diabetes) 30 tablet 5    Losartan (COZAAR) 25 mg Tablet Take 1 tablet by mouth every day. 90 tablet 3    Metformin (GLUCOPHAGE) 1,000 mg tablet Take 1 tablet by mouth 2 times daily with meals. (diabetes) 180 tablet 3    Omeprazole  (PRILOSEC) 20 mg Delayed Release Capsule Take 1 capsule by mouth two times daily before meals. 60 capsule 0    Ondansetron (ZOFRAN ODT) 8 mg disintegrating tablet Take 1 tablet by mouth every 8 hours if needed. 50 tablet 1    ONETOUCH ULTRA TEST Strips USE FOR TESTING BLOOD GLUCOSE 3 TIMES PER DAY 300 strip 11    Sucralfate (CARAFATE) 100 mg/mL Liquid Take 10 mL by mouth 2 times daily. 600 mL 3    Venlafaxine ([name]) 75 mg Tablet TAKE 1 TABLET BY MOUTH 3 TIMES DAILY. 90 tablet 1     No current facility-administered medications on file prior to visit.      Allergies:   Allergies   Allergen Reactions    Contrast Dye [Radiopaque Agent] Hives    Hydrochlorothiazide Other-Reaction in Comments     Patient reports    Januvia [Sitagliptin] Other-Reaction in Comments     Patient reports    Lyrica [Pregabalin] Other-Reaction in Comments     Patient reports    Omnipaque [Iohexol] Other-Reaction in Comments     Patient reports    Prozac [Fluoxetine Hcl] Other-Reaction in Comments     Patient reports  Sulfa (Sulfonamide Antibiotics) Rash    Topiramate Other-Reaction in Comments     Hairfall       Social History     Social History    Marital status: MARRIED     Spouse name: N/A    Number of children: 1    Years of education: N/A     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       Social History Main Topics    Smoking status: Former Smoker     Packs/day: 0.10     Years: 20.00    Smokeless tobacco: Former Systems developer      Comment: quit 30 years ago    Alcohol use 0.0 oz/week     0 Standard drinks or equivalent per week      Comment: rare shares a beer or glass wine every 2-3 months     Drug use: No    Sexual activity: Yes     Partners: Male     Other Topics Concern    None     Social History Narrative     Family History   Problem Relation Age of Onset    Diabetes Father     Heart Mother      MI at 41s     Non-contributory Brother     Non-contributory Brother        PE:  BP 139/78  Pulse 81   Temp 37 C (98.6 F) (Temporal)  Wt 82.6 kg (182 lb 1.6 oz)  LMP 05/06/1983  BMI 33.31 kg/m2  .General Appearance: healthy, alert, no distress, pleasant affect, cooperative.  Ears:  normal TMs--including right after removal of s small amount of ear wax; canal and external inspection of ears show no abnormality.  Nose:  nasal mucosa normal.  Mouth: Op clear.  Neck:  Supple; no LAD.  Heart:  normal rate and regular rhythm, no murmurs, clicks, or gallops.  Lungs: clear to auscultation.  Extremities:  no cyanosis, clubbing, or edema.  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 Left knee and left cheek; patient amenable to go back to DR Cable: [place]              Total encounter time including history, physical examination, and coordination of care was approximately 30 minutes of which more than 50% was spent counseling regarding assessment/diagnosis and treatment plan. No guarantees were made regarding her medical care or treatment outcome. Barriers to Learning: none.  Patient verbalizes understanding of teaching and instructions.    Electronically signed by:    [name], MD  [place], [place] [employer] of [employer] Medicine  Associate physician [facility], [place]    [phone]

## 2016-06-02 ENCOUNTER — Ambulatory Visit: Payer: Medicare Other | Attending: Internal Medicine | Admitting: Internal Medicine

## 2016-06-02 VITALS — BP 121/78 | HR 87 | Temp 98.1°F | Resp 16 | Ht 62.99 in | Wt 182.3 lb

## 2016-06-02 DIAGNOSIS — D490 Neoplasm of unspecified behavior of digestive system: Secondary | ICD-10-CM | POA: Insufficient documentation

## 2016-06-02 DIAGNOSIS — R1084 Generalized abdominal pain: Secondary | ICD-10-CM

## 2016-06-02 DIAGNOSIS — K760 Fatty (change of) liver, not elsewhere classified: Secondary | ICD-10-CM | POA: Insufficient documentation

## 2016-06-02 DIAGNOSIS — R112 Nausea with vomiting, unspecified: Secondary | ICD-10-CM

## 2016-06-02 DIAGNOSIS — K219 Gastro-esophageal reflux disease without esophagitis: Secondary | ICD-10-CM

## 2016-06-02 DIAGNOSIS — E119 Type 2 diabetes mellitus without complications: Secondary | ICD-10-CM

## 2016-06-02 DIAGNOSIS — E669 Obesity, unspecified: Secondary | ICD-10-CM

## 2016-06-02 DIAGNOSIS — C7A8 Other malignant neuroendocrine tumors: Secondary | ICD-10-CM

## 2016-06-02 NOTE — Progress Notes (Addendum)
HEMATOLOGY AND ONCOLOGY CLINIC VISIT     REASONS FOR FOLLOW-UP: well differentiated neuroendocrine tumor of duodenum    HEMATOLOGIC/ONCOLOGIC HISTORY  Patient with multiple medical problems including type II diabetes, obesity and NAFLD who presented with chronic intermittent nausea with vomiting and abdominal pain for two years.  Been seen by hepatologist Dr. Virgel Bouquet.  Abdominal CT on 12/28/15 is unrevealing.  She underwent EGD on 02/12/2016 which revealed a 1-1.5 cm polyp in the second part of the duodenum.  Other findings include mild antral gastritis and no evidence of varices.  The duodenal polyp was transected by hot snare.  Pathology is consistent with grade 1 well differentiated neuroendocrine tumor with positive synaptophysin.  Ki67 < 2%.  Her post-EGD course was complicated by post polypectomy bleeding which required an additional hemoclip at [facility].  Seth.Contes DOTATATE PET/CT scan on 02/28/16 reveals no evidence of residual or metastatic disease.  Normal gastrin level at 58 as of 02/29/16.      DISEASE STATUS: localized disease    SUMMARY OF HEMATOLOGIC/ONCOLOGIC THERAPY, RESPONSE & SIDE EFFECTS  1.    S/p polypectomy  02/12/16    ECOG PERFORMANCE STATUS  [name]    INTERVAL SUBJECTIVE HISTORY  Overall, Paula Daugherty feels the same as she did in November, although she has noted improvement in her nausea since being prescribed PRN Zofran by Dr. Virgel Bouquet. On ROS, she denies weight loss, or night sweats. She endorses continued "random nausea" every couple of days and non-bloody emesis every couple of weeks that is sometimes preceded by an "aura." The contents vary from stomach contents to dry heaving to white foam. She continues to have alternating constipation and diarrhea, and notes that a few weeks ago her stools were darker (although not black), but cleared back to normal color within a few days. She has noticed that her stool has contained more mucous lately. She continues  to have  intermittent abdominal pain along the midline with no resolution after having a bowel movement.  She also states that she has a left sided shoulder/chest tightness that is not associated with exertion and feels like a muscular pain. She endorses some mild dizziness when going from sitting to standing. She denies dysuria, urinary frequency.     - ROS otherwise negative except that noted above    MEDICATIONS:   Current Outpatient Prescriptions   Medication Sig    Amlodipine (NORVASC) 10 mg Tablet TAKE 1 TABLET BY MOUTH EVERY DAY.    Atorvastatin (LIPITOR) 10 mg Tablet Take 1 tablet by mouth every day at bedtime.    Fluorouracil (EFUDEX) 5 % Cream Apply to the affected area 2 times daily. Apply to lesions as directed on nose twice daily for 2-3 weeks    FLUTICASONE/SALMETEROL (ADVAIR DISKUS INHA) Take  by inhalation.    GlipiZIDE (GLUCOTROL XL) 10 mg SR Tablet Take 1 tablet by mouth every morning with a meal. (diabetes)    Losartan (COZAAR) 25 mg Tablet Take 1 tablet by mouth every day.    Metformin (GLUCOPHAGE) 1,000 mg tablet Take 1 tablet by mouth 2 times daily with meals. (diabetes)    Omeprazole (PRILOSEC) 20 mg Delayed Release Capsule Take 1 capsule by mouth two times daily before meals.    Ondansetron (ZOFRAN ODT) 8 mg disintegrating tablet Take 1 tablet by mouth every 8 hours if needed.    ONETOUCH ULTRA TEST Strips USE FOR TESTING BLOOD GLUCOSE 3 TIMES PER DAY    Sucralfate (CARAFATE)  100 mg/mL Liquid Take 10 mL by mouth 2 times daily.    Venlafaxine ([name]) 75 mg Tablet TAKE 1 TABLET BY MOUTH 3 TIMES DAILY.     No current facility-administered medications for this visit.         PHYSICAL EXAMINATION:   BP 121/78  Pulse 87  Temp 36.7 C (98.1 F) (Oral)  Resp 16  Ht 1.6 m (5' 2.99")  Wt 82.7 kg (182 lb 4.8 oz)  LMP 05/06/1983  SpO2 96%  BMI 32.3 kg/m2     General Appearance: healthy, alert, no distress, pleasant affect, cooperative.  Eyes:  conjunctivae and corneas clear. PERRL,  EOM's intact. sclerae normal.  Mouth: normal.  Neck:  Neck supple.   Heart:  normal rate and regular rhythm, no murmurs, clicks, or gallops.  Lungs: clear to auscultation.  Abdomen: Obese, soft, non-tender.  No masses or organomegaly.  Neuro: Gait normal. Strength grossly normal.  Mental Status: interacts appropriately, good eye contact.     LABS:   Lab Results   Lab Name Value Date/Time    WBC 9.3 03/31/2016 12:29 PM    WBC 8.9 08/30/2015 08:35 AM    HGB 12.5 03/31/2016 12:29 PM    HGB 14.4 08/30/2015 08:35 AM    HCT 37.8 03/31/2016 12:29 PM    HCT 44.6 08/30/2015 08:35 AM    MCV 85.4 03/31/2016 12:29 PM    MCV 83.6 08/30/2015 08:35 AM    PLT 261 03/31/2016 12:29 PM    PLT 217 08/30/2015 08:35 AM      Lab Results   Lab Name Value Date/Time    NA 141 02/29/2016 10:31 AM    NA 142 08/30/2015 08:35 AM    K 4.1 02/29/2016 10:31 AM    K 4.0 08/30/2015 08:35 AM    CL 103 02/29/2016 10:31 AM    CL 104 08/30/2015 08:35 AM    CO2 28 02/29/2016 10:31 AM    CO2 22 (L) 08/30/2015 08:35 AM    BUN 21 02/29/2016 10:31 AM    BUN 19 08/30/2015 08:35 AM    CR 1.10 02/29/2016 10:31 AM    CR 0.88 08/30/2015 08:35 AM    GLU 133 (H) 02/29/2016 10:31 AM    GLU 139 (H) 08/30/2015 08:35 AM    ALP 100 02/29/2016 10:31 AM    ALP 104 08/30/2015 08:35 AM    ALT 46 02/29/2016 10:31 AM    ALT 69 (H) 08/30/2015 08:35 AM      Gastrin: normal at 58    RADIOGRAPHIC STUDIES:   Nuclear Medicine Ga-68 DOTATATE PET/CT localization imaging 10/26    IMPRESSION:    1. No evidence of residual or metastatic disease.  2. Post biopsy changes within the transverse duodenum without abnormal  radiotracer activity.    PATHOLOGY:  A.  DUODENUM, POLYP (BIOPSY):       -  Well differentiated neuroendocrine tumor, Grade 1, transected, see         comment       -  Maximum size: 7.5 mm (measured on slide)       -  Ki67 <2%       -  Synaptophysin positive         >>>IMPRESSION  1.      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     Persistent nausea, vomiting and abdominal pain  3.   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  Duodenal neuroendocrine tumors are relatively rare and comprise only 2-3% of all GI endocrine tumors.  The majority of duodenal NET are nonfunctional and indolent with benign progression.  It sometimes could cause Zollinger-[place] syndrome and other clinical hormonal syndromes such as Cushing.  Patient's normal gastrin level ruled out ZES.      Patient has localized disease, and complete resection of the tumor would be the primary treatment.  Patient underwent incomplete resection via polypectomy.  Although Ga68 DOTATATE PET/CT after polypectomy reveals no evidence of residual or metastatic disease, we recommend to repeat EGD/EUS for potential complete resection given tumor was only transected at the time.  Patient has persistent GI symptoms including nausea, vomiting, and abdominal pain.  While these symptoms could be seen in patients with GI neuroendocrine tumor, they usually occur in patients with more extensive disease and hormonal secretion and Mrs MLasseignedoes not have any objective ie radiographic or laboratory evidence of disease burden.  Patient has normal SBFT.  Will continue to follow with GI Dr CVirgel Bouquetand pcp for further workup and evaluation.  Patient and family verbalized understanding of the plan and agree to proceed.    REVIEW OF RECOMMENDATIONS  1.     Diagnostics:           -none today; Chromogranin A is category 3 recommendation per NCCN guideline and will hold on for now   -plan to repeat DOTATATE scan by Oct 2018, will order at next clinic visit    2.      Therapy:         -awaiting for repeat EGD/EUS for complete endoscopic resection    3.     Followup:           -in 6 months or sooner if symptomatic    EDUCATION:   I have educated/instructed the patient or caregiver regarding all aspects of the above stated plan of care. All questions were answered to their satisfaction and the patient or caregiver indicated understanding.   I spent approximately 30 minutes with the patient and 60% of clinic visit was spent in counseling and coordinating care.    Patient's care was discussed with Dr. TRoselie Awkward MD  Internal Medicine  [place] PGY1  PI: [phone] Pager: [phone]     AApplingNOTE   This patient was discussed and care plan developed jointly with the Internal Medicine resident Dr PReesa Chew  I agree with the findings and plan as outlined in the housestaff note above with changes made to the body of the text as required.          Electronically signed by:  KMarlow Baars MD, MHS  Assistant Professor of Medicine  Hematology Oncology Division  Pager: [phone]

## 2016-06-02 NOTE — Nursing Note (Signed)
Two identifiers used, vital signs taken, allergies verified, screened for pain.  Leonna Schlee W Caprice Wasko, MA  Reviewed with patient if any new or worsening emotional or social concerns.     No- No action needed.  Raiford Fetterman,ma

## 2016-06-03 ENCOUNTER — Other Ambulatory Visit: Payer: Self-pay | Admitting: GASTROENTEROLOGY

## 2016-06-03 ENCOUNTER — Ambulatory Visit: Payer: Vision Other Private Insurance | Admitting: Optometrist

## 2016-06-03 ENCOUNTER — Encounter: Payer: Self-pay | Admitting: Optometrist

## 2016-06-03 DIAGNOSIS — Z135 Encounter for screening for eye and ear disorders: Secondary | ICD-10-CM

## 2016-06-03 DIAGNOSIS — E119 Type 2 diabetes mellitus without complications: Secondary | ICD-10-CM

## 2016-06-03 DIAGNOSIS — H524 Presbyopia: Secondary | ICD-10-CM

## 2016-06-03 MED ORDER — ONDANSETRON HCL 8 MG TABLET
8.0000 mg | ORAL_TABLET | Freq: Three times a day (TID) | ORAL | 3 refills | Status: DC | PRN
Start: 2016-06-03 — End: 2017-05-13

## 2016-06-03 NOTE — Progress Notes (Signed)
Patient Active Problem List   Diagnosis    DM2 (diabetes mellitus, type 2)    Depression with anxiety    Stress at home    Leg cramps-right calf    Right ear pain    Fibromyalgia    HTN (hypertension)    GERD (gastroesophageal reflux disease)    Hyperlipidemia with target LDL less than 70    Vitamin D insufficiency    Abnormal LFTs    Renal cyst ( 6.4 cm cyst left kidney)    Cough    Type 2 diabetes mellitus without complication    Fatty liver    Macroalbuminuric diabetic nephropathy    History of actinic keratoses    Cataract    Ptosis of eyelid    Liver fibrosis    Adrenal nodule    Medication Therapy Auth    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    Mixed conductive and sensorineural hearing loss of both ears    Neoplasm of uncertain behavior of skin

## 2016-06-04 NOTE — Progress Notes (Signed)
Chief Complaint   Patient presents with    Eye Glasses / Contact Lens Rx     71 year old female is here for an eye exam. CC: Patient states blurry vision near and distance that has been going on for the last 3-4 weeks. Needs diabetic check. recently diagnosed with duodenal cancer     Patient Active Problem List   Diagnosis    DM2 (diabetes mellitus, type 2)    Depression with anxiety    Stress at home    Leg cramps-right calf    Right ear pain    Fibromyalgia    HTN (hypertension)    GERD (gastroesophageal reflux disease)    Hyperlipidemia with target LDL less than 70    Vitamin D insufficiency    Abnormal LFTs    Renal cyst ( 6.4 cm cyst left kidney)    Cough    Type 2 diabetes mellitus without complication    Fatty liver    Macroalbuminuric diabetic nephropathy    History of actinic keratoses    Cataract    Ptosis of eyelid    Liver fibrosis    Adrenal nodule    Medication Therapy Auth    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    Mixed conductive and sensorineural hearing loss of both ears    Neoplasm of uncertain behavior of skin        Lab Results   Lab Name Value Date/Time    HGBA1C 6.1 (H) 03/17/2016 10:32 AM    HGBA1C 6.7 (H) 12/11/2015 08:49 AM    HGBA1C 6.6 (H) 08/30/2015 08:35 AM    HGBA1C 7.7 (H) 03/23/2015 09:04 AM    HGBA1C 6.9 (H) 12/06/2014 11:45 AM       Assessment/Plan:    1. Status post cataract extraction OU. Stable.     2. Presbyopia. Stable prescription. Released a glasses prescription to the patient, if desired.    3. Status post blepharoplasty OU.     4. 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  No evidence of diabetic retinopathy nor clinically significant macular edema was present in either eye.  I emphasized the long-term risk to vision from diabetes and the importance of strict glycemic control, blood pressure control, along with regular eye exams to detect diabetic retinopathy at an early stage to prevent vision loss. She should return in  one year for a dilated eye examination, or sooner if any changes are noticed.      Return to clinic in one year for a general eye examination or sooner if any changes are noticed.    Sutter Ahlgren Pederson-VanBuskirk, O.D, F.A.A.O.  Energy manager of Ophthalmology

## 2016-06-05 ENCOUNTER — Ambulatory Visit: Payer: Medicare Other | Admitting: Dermatology

## 2016-06-05 ENCOUNTER — Encounter: Payer: Self-pay | Admitting: GASTROENTEROLOGY

## 2016-06-05 DIAGNOSIS — L820 Inflamed seborrheic keratosis: Secondary | ICD-10-CM

## 2016-06-05 DIAGNOSIS — R208 Other disturbances of skin sensation: Secondary | ICD-10-CM

## 2016-06-05 MED ORDER — TRIAMCINOLONE ACETONIDE 0.1 % TOPICAL CREAM
TOPICAL_CREAM | Freq: Two times a day (BID) | TOPICAL | 1 refills | Status: DC
Start: 2016-06-05 — End: 2017-05-01

## 2016-06-05 NOTE — Telephone Encounter (Signed)
Forwarded for a PA Dr.Chak.  PA please for Ondansetron (ZOFRAN, AS HYDROCHLORIDE,) 8 mg Tablet.

## 2016-06-05 NOTE — Progress Notes (Signed)
06/05/2016, last seen  09/06/2015.    I had the pleasure of seeing Paula Daugherty, a delightful 37yrfemale in follow up for the following    Chief concern: spot on chest.     Past Medical History:  Prior history of skin cancer:  None.      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    Medication Therapy Auth 03/19/2016    Liver fibrosis 12/12/2015    Ptosis of eyelid 08/06/2015    Cataract 12/18/2014    History of actinic keratoses 12/06/2014    Macroalbuminuric diabetic nephropathy 09/12/2014    Fatty liver 08/28/2014    Type 2 diabetes mellitus without complication 067/89/3810   Cough 12/14/2013    Renal cyst ( 6.4 cm cyst left kidney) 08/24/2013    Hyperlipidemia with target LDL less than 70 07/28/2013    Vitamin D insufficiency 07/28/2013    Abnormal LFTs 07/28/2013    DM2 (diabetes mellitus, type 2) 07/11/2013    Depression with anxiety 07/11/2013    Stress at home 07/11/2013    Leg cramps-right calf 07/11/2013    Right ear pain 07/11/2013    Fibromyalgia 07/11/2013    HTN (hypertension) 07/11/2013    GERD (gastroesophageal reflux disease) 07/11/2013       Allergies: Contrast dye [radiopaque agent]; Hydrochlorothiazide; Januvia [sitagliptin]; Lyrica [pregabalin]; Omnipaque [iohexol]; Prozac [fluoxetine hcl]; Sulfa (sulfonamide antibiotics); and Topiramate     Medications:   Current Outpatient Prescriptions on File Prior to Visit   Medication Sig Dispense Refill    Amlodipine (NORVASC) 10 mg Tablet TAKE 1 TABLET BY MOUTH EVERY DAY. 90 tablet 1    Atorvastatin (LIPITOR) 10 mg Tablet Take 1 tablet by mouth every day at bedtime. 90 tablet 3    Fluorouracil (EFUDEX) 5 % Cream Apply to the affected area 2 times daily. Apply to lesions as  directed on nose twice daily for 2-3 weeks 40 g 0    FLUTICASONE/SALMETEROL (ADVAIR DISKUS INHA) Take  by inhalation.      GlipiZIDE (GLUCOTROL XL) 10 mg SR Tablet Take 1 tablet by mouth every morning with a meal. (diabetes) 30 tablet 5    Losartan (COZAAR) 25 mg Tablet Take 1 tablet by mouth every day. 90 tablet 3    Metformin (GLUCOPHAGE) 1,000 mg tablet Take 1 tablet by mouth 2 times daily with meals. (diabetes) 180 tablet 3    Omeprazole (PRILOSEC) 20 mg Delayed Release Capsule Take 1 capsule by mouth two times daily before meals. 60 capsule 0    Ondansetron (ZOFRAN ODT) 8 mg disintegrating tablet Take 1 tablet by mouth every 8 hours if needed. 50 tablet 1    Ondansetron (ZOFRAN, AS HYDROCHLORIDE,) 8 mg Tablet Take 1 tablet by mouth every 8 hours if needed. 270 tablet 3    ONETOUCH ULTRA TEST Strips USE FOR TESTING BLOOD GLUCOSE 3 TIMES PER DAY 300 strip 11    Sucralfate (CARAFATE) 100 mg/mL Liquid Take 10 mL by mouth 2 times daily. 600 mL 3    Venlafaxine ([name]) 75 mg Tablet TAKE 1 TABLET BY MOUTH 3 TIMES DAILY. 90 tablet 1     No current facility-administered medications on file prior to visit.  Family History:                                             There is not a family history of skin cancer, type: N/A     Social History:  The patient does not smoke or use tobacco products.  EtOH Use: 1 /month  Occupation: Retired    Automotive engineer  None.     Review of Systems: Not reviewed today.     Physical Examination:    LMP 05/06/1983  On physical examination, the patient is well developed, well nourished, and pleasant with normal affect. The patient is alert to time, place, and person.    The following areas were inspected and found to be normal except as specified below:   Head/scalp including palpation, face, eyes, eyelids and conjunctiva, nose, mouth/oral mucosa, lips, neck, chest, left lower leg.       Impression/Problems/Plan:    Diagnosis: Seborrheic keratoses       C/O: The patient  reports a brown spots for months duration involving the left lower leg and face. Symptoms are rough texture and brown color.Past/current treatment: none.      PE:  Multiple brown, verrucous, stuck-on papules on the trunk and extremities      Plan: The benign nature of these skin lesions was explained to the patient. No treatment is indicated at this time. The patient has been educated about self examination, and if changes occur including change in size,color or if lesions become symptomatic, the patient was instructed to return for follow up.     Diagnosis: Favor irritated seborrheic keratosis       C/O: The patient reports an irritated red spot for months duration involving the chest. States it has been red for about 1-2 months. Denies itching or pain to the area.  Past/current treatment: none.      PE: Inflamed brown papule located on the chest with no pigment network under dermatoscope.       Plan: Discussed treatment options at length with patient.   -- Start Triamcinolone (KENALOG) 0.1 % Cream; Apply to the affected area 2 times daily. Use twice a day for two weeks on the chest lesion.   -- Will reassess in 1 month.    Follow up: PRN    Education needs were addressed and there were no barriers to learning.     SCRIBE DISCLAIMER:   This note was scribed by Tomasa Hose, a trained medical scribe, in the presence of Venita Lick, MD  Electronically signed by Tomasa Hose, SCRIBE, 06/05/2016  8:05 AM    Agree with scribe documentation.     Venita Lick, MD  [facility] [facility]  Department of Dermatology

## 2016-06-06 ENCOUNTER — Telehealth: Payer: Self-pay | Admitting: GASTROENTEROLOGY

## 2016-06-06 NOTE — Telephone Encounter (Signed)
Pre procedure follow up call :  3 pt identifications verified , Pt confirmed appointment,  aware must have a driver  . All questions answered and verbalized understanding  about the procedure no further questions asked.  For any further questions,concerns, or need to cancel appointment advise to call back [phone]  Monday to Friday 8am to 4pm .  After hours to call [phone] and ask for the GI Fellow on-call.     Holley Bouche, RN  [facility]

## 2016-06-09 NOTE — Telephone Encounter (Signed)
Patient notified via mychart. Thank you Inna!

## 2016-06-09 NOTE — Telephone Encounter (Signed)
PA has been approved until 06/08/17. Patient and pharmacy will be notified    Thanks!

## 2016-06-10 ENCOUNTER — Ambulatory Visit
Admission: RE | Admit: 2016-06-10 | Discharge: 2016-06-15 | Disposition: A | Discharge status: Home / Self Care | Payer: Medicare Other | Source: Ambulatory Visit | Attending: GASTROENTEROLOGY | Admitting: GASTROENTEROLOGY

## 2016-06-10 ENCOUNTER — Encounter: Payer: Self-pay | Admitting: GASTROENTEROLOGY

## 2016-06-10 DIAGNOSIS — K2950 Unspecified chronic gastritis without bleeding: Secondary | ICD-10-CM

## 2016-06-10 DIAGNOSIS — Z85020 Personal history of malignant carcinoid tumor of stomach: Secondary | ICD-10-CM | POA: Insufficient documentation

## 2016-06-10 DIAGNOSIS — D3A098 Benign carcinoid tumors of other sites: Secondary | ICD-10-CM | POA: Insufficient documentation

## 2016-06-10 DIAGNOSIS — K2960 Other gastritis without bleeding: Secondary | ICD-10-CM

## 2016-06-10 DIAGNOSIS — K3189 Other diseases of stomach and duodenum: Secondary | ICD-10-CM

## 2016-06-10 HISTORY — DX: Mental disorder, not otherwise specified: F99

## 2016-06-10 MED ORDER — DIPHENHYDRAMINE 50 MG/ML INJECTION SOLUTION
12.5000 mg | INTRAMUSCULAR | Status: DC | PRN
Start: 2016-06-10 — End: 2016-06-16
  Filled 2016-06-10: qty 1

## 2016-06-10 MED ORDER — FENTANYL (PF) 50 MCG/ML INJECTION SOLUTION
12.5000 ug | INTRAMUSCULAR | Status: DC | PRN
Start: 2016-06-10 — End: 2016-06-16
  Administered 2016-06-10: 100 ug via INTRAVENOUS
  Filled 2016-06-10: qty 4

## 2016-06-10 MED ORDER — FLUMAZENIL 0.1 MG/ML INTRAVENOUS SOLUTION
0.2000 mg | INTRAVENOUS | Status: DC | PRN
Start: 2016-06-10 — End: 2016-06-16

## 2016-06-10 MED ORDER — MIDAZOLAM (PF) 1 MG/ML INJECTION SOLUTION
0.5000 mg | INTRAMUSCULAR | Status: DC | PRN
Start: 2016-06-10 — End: 2016-06-16
  Administered 2016-06-10: 5 mg via INTRAVENOUS
  Filled 2016-06-10: qty 10

## 2016-06-10 MED ORDER — EPINEPHRINE 0.1 MG/ML INJECTION SYRINGE
1.0000 mg | INJECTION | INTRAMUSCULAR | Status: DC | PRN
Start: 2016-06-10 — End: 2016-06-16
  Filled 2016-06-10: qty 10

## 2016-06-10 MED ORDER — NALOXONE 0.4 MG/ML INJECTION SOLUTION
0.4000 mg | INTRAMUSCULAR | Status: DC | PRN
Start: 2016-06-10 — End: 2016-06-16

## 2016-06-10 MED ORDER — ONABOTULINUMTOXINA 100 UNIT SOLUTION FOR INJECTION
INTRAMUSCULAR | Status: AC
Start: 2016-06-10 — End: 2016-06-10
  Filled 2016-06-10: qty 100

## 2016-06-10 MED ORDER — EPINEPHRINE 0.1 MG/ML INJECTION SYRINGE
0.3000 mg | INJECTION | INTRAMUSCULAR | Status: DC | PRN
Start: 2016-06-10 — End: 2016-06-16

## 2016-06-10 MED ORDER — SIMETHICONE 40 MG/0.6 ML ORAL DROPS,SUSPENSION
40.0000 mg | Freq: Once | ORAL | Status: AC
Start: 2016-06-10 — End: 2016-06-10
  Administered 2016-06-10: 40 mg
  Filled 2016-06-10: qty 0.6

## 2016-06-10 MED ORDER — LACTATED RINGERS IV INFUSION
INTRAVENOUS | Status: DC
Start: 2016-06-10 — End: 2016-06-16
  Administered 2016-06-10: 09:00:00 via INTRAVENOUS

## 2016-06-10 MED ORDER — NACL 0.9% IV INFUSION
INTRAVENOUS | Status: DC
Start: 2016-06-10 — End: 2016-06-16

## 2016-06-10 MED ORDER — ATROPINE 0.1 MG/ML INJECTION SYRINGE
0.5000 mg | INJECTION | INTRAMUSCULAR | Status: DC | PRN
Start: 2016-06-10 — End: 2016-06-16

## 2016-06-10 MED ORDER — PROMETHAZINE 50 MG/ML INJECTION SOLUTION
6.2500 mg | INTRAMUSCULAR | Status: DC | PRN
Start: 2016-06-10 — End: 2016-06-16

## 2016-06-10 MED ORDER — LIDOCAINE HCL 2 % MUCOSAL SOLUTION
5.0000 mL | Freq: Once | Status: AC
Start: 2016-06-10 — End: 2016-06-10
  Administered 2016-06-10: 5 mL via OROMUCOSAL
  Filled 2016-06-10: qty 15

## 2016-06-10 NOTE — Nurse Procedure (Signed)
NURSING PROCEDURE NOTE    Note started: 06/10/2016, 1110    Procedure pause done & plan of sedation reviewed. Sedation administered as ordered. Bite block placed. S/P EGD. Biopsies taken from duodenal-polypectomy site (A) & stomach (B). Oral secretions suctioned as needed the entire procedure. Patient tolerated procedure well. Refer to MD notes & nursing flowsheet for details. To RR with Alphonse Guild., RN whom I gave report to.    Leo Grosser, RN

## 2016-06-10 NOTE — H&P (Signed)
GASTROENTEROLOGY PRE-PROCEDURE H&P    Referring provider: [name], MD  Procedure: EGD    Subjective:  HPI: Paula Daugherty is a 71yrold female who presents for: history of duodenal carcinoid.    ROIZ:[id number] negative.  CV: negative.  Resp: negative.  GI: negative.     I did review all available medical, surgical, personal/social history.    Physical exam:  General Appearance: healthy, alert, no distress, pleasant affect, cooperative.  Heart:  normal rate and regular rhythm, no murmurs, clicks, or gallops.  Lungs: clear to auscultation.  Abdomen: BS normal.  Abdomen soft, non-tender.  No masses or organomegaly.    Airway Assessment:    Mallampati class 2  ROM normal      Labs/imaging:  [facility] reviewed: see computer database.  Performed outside of [facility] (reviewed if applicable):     Impression/ plan:  EGD  Patient is a candidate for moderate sedation.  Patient is ASA status: 2    The procedure, risks, benefits, and alternatives were explained.  All patient questions were answered.  The informed consent was signed, and will be scanned into the computer database at a later date.    Patient barriers to learning: none    Patient/family understanding: verbalizes    EAllen Derry MD

## 2016-06-10 NOTE — Procedures (Signed)
Pre-Procedure Time Out Checklist  (do not remove)     Attestation:  ID verified by two sources (select any two from list): MRN, DOB and Name  Was this an emergency procedure?  no     I attest that I verified the following information prior to performing the procedure: Patient ID, Site and Procedure     Patient: Paula Daugherty  Location:   Medical record number: [id number]  Sex: female  Age: 64yrs)  Date of birth: [birth year]   Procedure: Upper Endoscopy  Sub-procedure: Biopsy     Date of Service: 06/10/2016    Sedation start: 1043  Time start: 1056  Time end: 1106    Referring Physician: PCP: NJamelle Haring MD.     PERFORMING SURGEON: EAllen Derry MD. I performed the entire procedure.    ASSISTANT SURGEON: None    GI Pre-procedure Indications:  History of duodenal carcinoid, here for surveillance EGD.  PET scan was recently negative.    Details of the Procedure:    Informed consent was obtained for the procedure, including sedation. Risks of perforation, hemorrhage, adverse drug reaction, pain, infection and aspiration were discussed. The patient was placed in the left lateral decubitus position. Based on the pre-procedure assessment, including review of the patient's medical history, medications, allergies, and review of systems, she had been deemed to be an appropriate candidate for conscious sedation; she was therefore sedated with the medications listed below. The patient was monitored continuously with EKG tracing, pulse oximetry, blood pressure monitoring, and direct observations.     An oral examination was performed. The Olympus endoscope was inserted into the mouth and advanced under direct vision to the second portion of the duodenum.   A careful inspection was made as the gastroscope was withdrawn, including a retroflexed view of the proximal stomach; findings and interventions are described below. Appropriate photo documentation was obtained. The patient tolerated the procedure well, and there were no  complications. She was taken to the recovery area in stable condition.     Sedation:   Sedation was administered for single procedure/procedures  Versed 5 mg IV and Fentanyl 100 mcg IV     Findings and Interventions:   Esophageal mucosa normal  Esophageal-gastric junction localized at 35 cm.  Squamo-columnar junction regular  Hiatal hernia absent  Gastric mucosa mild erythema at the gastric antrum is noted.  Duodenal mucosa the previous resection site is seen in the second portion of the duodenum.  Hemoclip is seen in place.  At the base of the hemoclip is a small bumpy raised area is seen.  This area was biopsied multiple times and the area was marked with spot tattoo.  Retroflexed view normal    Impression:   1. Site of previous carcinoid was seen and biopsied.  The small bumpy area may represent residual carcinoid or benign granulation tissue from tissue healing.  2. Antral gastritis s/p biopsy.    Recommendation/Plan:   Await pathology  Follow up with Dr. TMarti Sleigh(oncology)      EAllen Derry MD   06/10/2016    EAllen Derry MD  Attending Physician

## 2016-06-10 NOTE — Discharge Instructions (Signed)
Discharge Instructions for upper endoscopy    Findings:  upper endoscopy: previous carcinoid site was seen and it was biopsied.    Activity:    Rest today, resume usual activitiy tomorrow     Diet:    Resume usual diet:     Medication:    Resume all usual medications    Follow up:   We will notify you of your biopsy results via phone call or letter.If you do not receive notice of your results by three weeks, please call us at the phone number provided below.      During your procedure, air was pumped into your GI tract so your doctor could see clearly to make a diagnosis and/or treat your problem.     Some possible side effects you may experience are:   - Discomfort due to a distended (bloated) abdomen which will subside after a few hours to two days.   - Nausea may be a side effect of the medication and will subside.   - The medications you received may make you dizzy and sleepy, it is important that you do not drive, operate machinery or drink alcohol for at least one day.   - Severe pain is not expected and should be reported    Other side effects may include:  Upper Endoscopy: A sore throat can be helped by cough drops, or a salt-water gargle.    If problems, call [facility] at [phone] during business hours Monday through Friday 7:30am to 4:30 pm.  After hours call [phone] and ask to speak to the GI Fellow on call.

## 2016-06-10 NOTE — Nurse Assessment (Signed)
NURSING PRE-SEDATION ASSESSMENT    Paula Daugherty arrived by ambulating  into clinic today at 0850 from home.      Patient has arranged for a ride home from [facility] who can be contacted at [phone]. Instructed patient that post sedation, they are not to drive or drink alcohol for the remainder of the day and patient verbalized understanding.      Identification band placed on and two forms of identification information verified: yes    Verified procedure with the patient. yes    NPO since: 2/5 @2000 , liquid 2/6 @0700     Pt completed bowel prep:  Not applicable      Past Surgical History:   Procedure Laterality Date    COLONOSCOPY  01/04/14    polyp--TA and erosion; hemorrhoids; tics; repeat x 3 yrs    MAMMOPLASTY, REDUCTION  1985    PHACOEMULSIFICATION, CATARACT Right 02/12/2015    with IOL    PR ESOPHAGOGASTRODUODENOSCOPY TRANSORAL DIAGNOSTIC  02/12/2016    EGD--polyp--path pending; antral gastritis; no varices     PR KNEE SCOPE,DIAGNOSTIC  1980s    Knee arthroscopy    PR LIGATE FALLOPIAN TUBE      Tubal ligation    PR REPAIR TYMPANIC MEMBRANE      Tympanoplasty    PR TOTAL ABDOM HYSTERECTOMY  1980s    partial; no BSO    REPAIR, BLEPHAROPTOSIS Bilateral 08/06/2015    Blepharoplasty bilateral upper lids       Social History   Substance Use Topics    Smoking status: Former Smoker     Packs/day: 0.10     Years: 20.00    Smokeless tobacco: Former Systems developer      Comment: quit 30 years ago    Alcohol use 0.0 oz/week     0 Standard drinks or equivalent per week      Comment: rare shares a beer or glass wine every 2-3 months        History   Drug Use No        Past anesthesia responses:  no prior anesthesia experience.      Current Outpatient Prescriptions   Medication Sig    Amlodipine (NORVASC) 10 mg Tablet TAKE 1 TABLET BY MOUTH EVERY DAY.    Atorvastatin (LIPITOR) 10 mg Tablet Take 1 tablet by mouth every day at bedtime.    Fluorouracil (EFUDEX) 5 % Cream Apply to the affected area 2 times daily. Apply to lesions as  directed on nose twice daily for 2-3 weeks    FLUTICASONE/SALMETEROL (ADVAIR DISKUS INHA) Take  by inhalation.    GlipiZIDE (GLUCOTROL XL) 10 mg SR Tablet Take 1 tablet by mouth every morning with a meal. (diabetes)    Losartan (COZAAR) 25 mg Tablet Take 1 tablet by mouth every day.    Metformin (GLUCOPHAGE) 1,000 mg tablet Take 1 tablet by mouth 2 times daily with meals. (diabetes)    Omeprazole (PRILOSEC) 20 mg Delayed Release Capsule Take 1 capsule by mouth two times daily before meals.    Ondansetron (ZOFRAN ODT) 8 mg disintegrating tablet Take 1 tablet by mouth every 8 hours if needed.    Ondansetron (ZOFRAN, AS HYDROCHLORIDE,) 8 mg Tablet Take 1 tablet by mouth every 8 hours if needed.    ONETOUCH ULTRA TEST Strips USE FOR TESTING BLOOD GLUCOSE 3 TIMES PER DAY    Sucralfate (CARAFATE) 100 mg/mL Liquid Take 10 mL by mouth 2 times daily.    Triamcinolone (KENALOG) 0.1 % Cream Apply to the affected  area 2 times daily. Use twice a day for two weeks on the chest lesion.    Venlafaxine ([name]) 75 mg Tablet TAKE 1 TABLET BY MOUTH 3 TIMES DAILY.     Current Facility-Administered Medications   Medication    Atropine Injection 0.5 mg    DiphenhydrAMINE (BENADRYL) Injection 12.5-25 mg    Epinephrine (ADRENALIN) 0.1 mg/mL Injection 0.3-1 mg    Epinephrine (ADRENALIN) 0.1 mg/mL Injection 0.3-1 mg    Epinephrine (ADRENALIN) 0.1 mg/mL Injection 1 mg    Fentanyl (SUBLIMAZE) Injection 12.5-75 mcg    Flumazenil (ROMAZICON) Injection 0.2 mg    Lidocaine (XYLOCAINE) 2 % Viscous Solution 5 mL    Midazolam (VERSED) Injection 0.5-3 mg    NaCl 0.9% Infusion    Naloxone (NARCAN) Injection 0.4 mg    Promethazine (PHENERGAN) Injection 6.5-25 mg    Simethicone (MYLICON) 40 IR/6.7 mL Oral Suspension 40-80 mg       Anticoagulants: Patient denies taking Aspirin, NSAIDs, or other anticoagulants for the past 5 days.    OTC/Herbal preparations: none    Reviewed health status with the patient in regards to allergies  (including latex), medications, pregnancy, hypertension, atrial fibrilation, liver disease, bleeding disorders, diabetes,  CVA's, seizures, sleep apnea, glaucoma, cancer, prosthesis or implants, infectious diseases, surgical history; and disease of heart, lungs, liver, or kidneys. Positive history reviewed and updated in Health History section of the EMR.    Patient Active Problem List   Diagnosis    DM2 (diabetes mellitus, type 2)    Depression with anxiety    Stress at home    Leg cramps-right calf    Right ear pain    Fibromyalgia    HTN (hypertension)    GERD (gastroesophageal reflux disease)    Hyperlipidemia with target LDL less than 70    Vitamin D insufficiency    Abnormal LFTs    Renal cyst ( 6.4 cm cyst left kidney)    Cough    Type 2 diabetes mellitus without complication    Fatty liver    Macroalbuminuric diabetic nephropathy    History of actinic keratoses    Cataract    Ptosis of eyelid    Liver fibrosis    Adrenal nodule    Medication Therapy Auth    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    Mixed conductive and sensorineural hearing loss of both ears    Neoplasm of uncertain behavior of skin       Allergies   Allergen Reactions    Contrast Dye [Radiopaque Agent] Hives    Hydrochlorothiazide Other-Reaction in Comments     Patient reports    Januvia [Sitagliptin] Other-Reaction in Comments     Patient reports    Lyrica [Pregabalin] Other-Reaction in Comments     Patient reports    Omnipaque [Iohexol] Other-Reaction in Comments     Patient reports    Prozac [Fluoxetine Hcl] Other-Reaction in Comments     Patient reports    Sulfa (Sulfonamide Antibiotics) Rash    Topiramate Other-Reaction in Comments     Hairfall       Past Medical History:   Diagnosis Date    Asthma     Cirrhosis     Diabetes mellitus     Hypertension     Kidney disease     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/20/2016    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  Circulation/Perfusion/Skin: warm,normal color,dry.  Cognition/Communication -- Behavior: alert and oriented,cooperative.  Gastro-Intestinal: no distress.  Abdomen: soft,non-tender.  Level of Ambulation: self.    Belongings are with patient in garment bag under gurney.  Patient has: no glasses, dentures, jewelry, or hearing aides.  Barriers to Learning assessed: none. Patient verbalizes understanding of teaching and instructions.    Nursing assessment completed by:  Sandria Senter, RN  06/10/2016 09:01

## 2016-06-11 ENCOUNTER — Encounter: Payer: Self-pay | Admitting: Family Medicine

## 2016-06-11 HISTORY — PX: PR ESOPHAGOGASTRODUODENOSCOPY TRANSORAL DIAGNOSTIC: 43235

## 2016-06-12 ENCOUNTER — Encounter: Payer: Self-pay | Admitting: GASTROENTEROLOGY

## 2016-06-12 LAB — SURGICAL PATHOLOGY

## 2016-06-13 ENCOUNTER — Encounter: Payer: Self-pay | Admitting: "Endocrinology

## 2016-06-13 ENCOUNTER — Ambulatory Visit: Payer: Medicare Other | Admitting: "Endocrinology

## 2016-06-13 ENCOUNTER — Ambulatory Visit: Payer: Self-pay | Admitting: "Endocrinology

## 2016-06-13 DIAGNOSIS — E119 Type 2 diabetes mellitus without complications: Secondary | ICD-10-CM

## 2016-06-13 DIAGNOSIS — E1129 Type 2 diabetes mellitus with other diabetic kidney complication: Secondary | ICD-10-CM

## 2016-06-13 DIAGNOSIS — E279 Disorder of adrenal gland, unspecified: Secondary | ICD-10-CM

## 2016-06-13 MED ORDER — GLIPIZIDE ER 10 MG TABLET, EXTENDED RELEASE 24 HR
2.0000 | EXTENDED_RELEASE_CAPSULE | Freq: Every day | ORAL | 5 refills | Status: DC
Start: 2016-06-13 — End: 2017-02-16

## 2016-06-13 NOTE — Nursing Note (Signed)
Patient verified by name and date of birth.   Vital signs assessed, allergies verified, screened for pain and verified pharmacy.   Sherill Wegener Banducci Bristow, MA

## 2016-06-13 NOTE — Progress Notes (Signed)
Endocrinology Follow up clinic note:    Chief Complaint:  "I'm here to follow up on my diabetes."    HPI: Paula Daugherty is a 71yrold female who has a diagnosis of type 2 diabetes mellitus who presents to Endocrinology for follow up. Her history is as follows:  She was initially diagnosed with diabetes around 2000 on routine labs. She has a strong family history of diabetes so she wasn't surprised at this. She was started on Metformin around 2005 and then she was started on insulin in 2014. She was up to 64 units of Lantus at night. However, after making changes to her diet and lifestyle, she was taken off insulin in 2015.        She also has a history of an elevated alk phos level. A GGT level was checked and was found to be elevated. A bone specific alkaline phosphatase was also found to be elevated. PTH level was normal. Alk phos level subsequently normalized.     Interim History: She returns today for follow up. Her last visit with Endocrinology was on 03/07/2016. Since this visit, she states that she has been doing ok but her blood glucose has continued to run high. Most mornings this is elevated >200. She is taking her medications regularly but does miss her morning doses of metformin and glipizide maybe once per week. She does have occasional symptoms of hypoglycemia but when she checks her blood glucose, it is often in the normal range or high.     For her history of carcinoid, she recently had a follow up endoscopy and the biopsies from this were all negative. She will follow up with Dr. TMarti Sleighand Dr. CVirgel Bouquetin March. She continues to have occasional abdominal pain and nausea/vomiting for which she is following with Dr. CVirgel Bouquet She has a gastric emptying study scheduled to evaluate this further.     Of note, she did stop atorvastatin about 1 month ago as she was concerned this was leading to some hair loss.     Current Regimen:                          Metformin 1000 mg BID                        Glipizide 10  mg daily  Hypoglycemia: Does occasionally have symptoms but blood glucose is not low when checked  Hyperglycemia: Yes, has been running higher overall  Meter brought today: Yes  Checks blood glucose: 2x/day  Home blood glucose numbers per pt's log:    Blood Glucose:                Pre-breakfast: 192 to 248   Bedtime:114 to 373  Last eye exam: 04/2015, no retinopathy, f/u 1 year  Foot exam: Will do today    ROS:  All other systems were reviewed and are negatative except for pertinent positive and negative responses as documented in HPI.     Medications:  Medication reconciliation was performed today.   Current Outpatient Prescriptions on File Prior to Visit   Medication Sig Dispense Refill    Amlodipine (NORVASC) 10 mg Tablet TAKE 1 TABLET BY MOUTH EVERY DAY. 90 tablet 1    Atorvastatin (LIPITOR) 10 mg Tablet Take 1 tablet by mouth every day at bedtime. 90 tablet 3    FLUTICASONE/SALMETEROL (ADVAIR DISKUS INHA) Take  by inhalation.      Losartan (  COZAAR) 25 mg Tablet Take 1 tablet by mouth every day. 90 tablet 3    Metformin (GLUCOPHAGE) 1,000 mg tablet Take 1 tablet by mouth 2 times daily with meals. (diabetes) 180 tablet 3    Omeprazole (PRILOSEC) 20 mg Delayed Release Capsule Take 1 capsule by mouth two times daily before meals. 60 capsule 0    Ondansetron (ZOFRAN ODT) 8 mg disintegrating tablet Take 1 tablet by mouth every 8 hours if needed. 50 tablet 1    Ondansetron (ZOFRAN, AS HYDROCHLORIDE,) 8 mg Tablet Take 1 tablet by mouth every 8 hours if needed. 270 tablet 3    ONETOUCH ULTRA TEST Strips USE FOR TESTING BLOOD GLUCOSE 3 TIMES PER DAY 300 strip 11    Triamcinolone (KENALOG) 0.1 % Cream Apply to the affected area 2 times daily. Use twice a day for two weeks on the chest lesion. 30 g 1    Venlafaxine ([name]) 75 mg Tablet TAKE 1 TABLET BY MOUTH 3 TIMES DAILY. 90 tablet 1     Current Facility-Administered Medications on File Prior to Visit   Medication Dose Route Frequency Provider Last Rate Last  Dose    Atropine Injection 0.5 mg  0.5 mg IV PRN X 1 [name], MD        DiphenhydrAMINE (BENADRYL) Injection 12.5-25 mg  12.5-25 mg IV Q3MIN PRN [name], MD        Epinephrine (ADRENALIN) 0.1 mg/mL Injection 0.3-1 mg  0.3-1 mg SUBMUCOSAL PRN X 1 [name], MD        Epinephrine (ADRENALIN) 0.1 mg/mL Injection 0.3-1 mg  0.3-1 mg Flush through endoscope PRN X 1 [name], MD        Epinephrine (ADRENALIN) 0.1 mg/mL Injection 1 mg  1 mg IV PRN X 1 [name], MD        Fentanyl (SUBLIMAZE) Injection 12.5-75 mcg  12.5-75 mcg IV Q3MIN PRN [name], MD   100 mcg at 06/10/16 1114    Flumazenil (ROMAZICON) Injection 0.2 mg  0.2 mg IV Q1MIN PRN [name], MD        Lactated Ringers Infusion   IV CONTINUOUS [name], MD 20 mL/hr at 06/10/16 0913      Midazolam (VERSED) Injection 0.5-3 mg  0.5-3 mg IV Q3MIN PRN [name], MD   5 mg at 06/10/16 1114    NaCl 0.9% Infusion   IV CONTINUOUS [name], MD        Naloxone [facility]) Injection 0.4 mg  0.4 mg IV Q3MIN PRN [name], MD        Promethazine (PHENERGAN) Injection 6.5-25 mg  6.5-25 mg IV PRN X 1 [name], MD         I did review patient's past medical and family/social history, no changes noted.   PMH:  Past medical history was reviewed from problem list.   Patient Active Problem List   Diagnosis    DM2 (diabetes mellitus, type 2)    Depression with anxiety    Stress at home    Leg cramps-right calf    Right ear pain    Fibromyalgia    HTN (hypertension)    GERD (gastroesophageal reflux disease)    Hyperlipidemia with target LDL less than 70    Vitamin D insufficiency    Abnormal LFTs    Renal cyst ( 6.4 cm cyst left kidney)    Cough    Type 2  diabetes mellitus without complication    Fatty liver    Macroalbuminuric diabetic nephropathy    History of actinic keratoses    Cataract    Ptosis of eyelid    Liver fibrosis    Adrenal nodule    Medication Therapy Auth    Primary neuroendocrine  carcinoma of duodenum    Mixed conductive and sensorineural hearing loss of both ears    Neoplasm of uncertain behavior of skin     VITAL SIGNS:  Temp: 36.4 C (97.5 F) (02/09 0831)  Temp src: Temporal (02/09 0831)  Pulse: 76 (02/09 0831)  BP: 133/81 (02/09 0831)  Resp: 16 (02/09 0831)  SpO2: --  Height: --  Weight: 83.2 kg (183 lb 6.8 oz) (02/09 0831)    PHYSICAL EXAM:  General Appearance: healthy, alert, no distress, pleasant affect, cooperative.  Eyes:  conjunctivae and corneas clear. PERRL, EOM's intact. sclerae normal.  Mouth: normal.  Neck:  Neck supple. No adenopathy, thyroid symmetric, normal size.  Heart:  normal rate and regular rhythm, no murmurs, clicks, or gallops.  Lungs: clear to auscultation.  Abdomen: BS normal.  Abdomen soft, slightly tender to palpation in left lower quadrant.  No masses or organomegaly.  Extremities:  no cyanosis, clubbing, or edema.  Foot Exam: normal DP and PT pulses, no trophic changes or ulcerative lesions, normal sensory exam and normal monofilament exam.  Skin:  Skin color, texture, turgor normal. No rashes or lesions.  Neuro: Gait normal. No tremor appreciated. Sensation and strength grossly normal.  Mental Status: Appearance/Cooperation: in no apparent distress and well developed and well nourished  Eye Contact: normal  Speech: normal volume, rate, and pitch    LAB TESTS/STUDIES:   I personally reviewed the following laboratory and imaging studies.     02/29/2016 10:31 02/29/2016 10:31 03/17/2016 10:32   SODIUM 141     POTASSIUM 4.1     CHLORIDE 103     CARBON DIOXIDE TOTAL 28     UREA NITROGEN, BLOOD (BUN) 21     CREATININE BLOOD 1.10     GLUCOSE 133 (H)     CALCIUM 9.4     PROTEIN 7.6     ALBUMIN 3.8     ALKALINE PHOSPHATASE (ALP) 100     ASPARTATE TRANSAMINASE (AST) 44 (H)     BILIRUBIN TOTAL 0.5     ALANINE TRANSFERASE (ALT) 46     E-GFR, AFRICAN AMERICAN 60     E-GFR, NON-AFRICAN AMERICAN 52     IRON TOTAL 25 (L) 25 (L)    TRANSFERRIN 272     TOTAL IRON BINDING  CAPACITY 378     IRON PERCENT SATURATION 6.6 (L)     FERRITIN 15     GASTRIN, SERUM 58     HGB A1C   6.1 (H)   HGB A1C,GLUCOSE EST AVG   128   VITAMIN B12   197 (L)   VITAMIN D, 25 HYDROXY   34.4      03/31/2016 12:29   WHITE BLOOD CELL COUNT 9.3   HEMOGLOBIN 12.5   HEMATOCRIT 37.8   MCV 85.4   RDW 13.5   PLATELET COUNT 261      08/30/2015 08:49   CREATININE SPOT URINE 123.04   MICROALBUMIN URINE 34.8   MICROALBUMIN/CREATININE RATIO 283 (H)     01/08/2016:    Ref Range & Units 2moago    TOTAL VOLUME mL 1625   TIME OF COLLECTION hr 24   DOPAMINE,UR PER VOLUME  ug/L 91   DOPAMINE,UR PER 24HR 77 - 324 ug/d 148   DOPAMINE,UR RATIO TO CRT 0 - 250 ug/g CRT 147   NOREPINEPHRINE,UR PER VOLUME ug/L 43   NOREPINEPHRINE,UR PER 24HR 16 - 71 ug/d 70   NOREPINEPHRINE,UR RATIO TO CRT 0 - 45 ug/g CRT 69 (H)   EPINEPHRINE,UR PER VOLUME ug/L 1   EPINEPHRINE,UR PER 24 HR 1 - 7 ug/d 2   EPINEPHRINE,UR RATIO TO CRT 0 - 20 ug/g CRT 2         Ref Range & Units 17moago    TOTAL VOLUME mL 1625   TIME OF COLLECTION hr 24   CORTISOL,UR FREE PER VOLUME ug/L 8.46   CORTISOL,UR FREE PER 24HR <=45.0 ug/d 13.7   CORTISOL,UR FREE RATIO TO CRT ug/g CRT 13.65         Ref Range & Units 24mogo    TOTAL VOLUME mL 1625   TIME OF COLLECTION hr 24   METANEPHRINE,UR-PER VOLUME ug/L 71   METANEPHRINE,UR-PER 24HR 39 - 143 ug/d 115   METANEPHRINE,UR-RATIO TO CRT 0 - 300 ug/g CRT 115   NORMETANEPHRINE,UR-PER VOLUME ug/L 450   NORMETANEPHRINE,UR-PER 24HR 109 - 393 ug/d 731 (H)   NORMETANEPHRIN,UR-RATIO TO CRT 0 - 400 ug/g CRT 726 (H)         Ref Range & Units 60m70moo    Urine Volume mL 1625   START DATE   01/07/2016   START TIME   0838   STOP DATE   01/08/2016   STOP TIME   0838   COLLECTION INTERVAL, URINE H 24.00   Creatinine Conc mg/dL 68.23   Creatinine 24 Hr 800 - 2000 mg/24Hr 1109      CT Abdomen and Pelvis (12/28/2015):  FINDINGS:  LOWER CHEST  Unremarkable.    ABDOMEN AND PELVIS  Liver: Unremarkable appearance of the liver with inability to assess  for  hepatic steatosis on CT. A few subcentimeter hypoattenuating lesions in  right hepatic dome are too small to characterize.  Gallbladder: Unremarkable.  Pancreas: Unremarkable.  Spleen: Unremarkable.  Adrenals: Right adrenal gland is unremarkable. Left adrenal gland nodule  measuring up to 1.4 cm, statistically likely adenoma.  Kidneys: 1.7 cm partially exophytic cyst within midpole of left kidney.  Additional hypodensities are too small to characterize. No hydronephrosis.  Bladder: Decompressed and poorly evaluated.  Reproductive Organs: Status post hysterectomy. No adnexal masses.  Bowel: Colonic diverticulosis without adjacent inflammatory change. No  abnormal bowel wall thickening or dilatation.  Nodes: Mildly prominent, nonspecific porta hepatis lymph nodes measuring up  to 12 mm.  Vessels: Mild aortoiliac atherosclerosis.  Other: No free fluid.    BONES AND SUBCUTANEOUS SOFT TISSUES  Soft tissue: Unremarkable.  Bones: Mild degenerative changes. No acute bony abnormality.    IMPRESSION:  1. No acute intra-abdominal process.  2. No definite CT evidence of liver disease with inability to assess  for hepatic steatosis on CT. Given discrepancy with previous ultrasound  study of 10/31/2015 recommend dedicated MRI elastography for further  evaluation of fibrosis.  3. A 1.4 cm left adrenal nodule is indeterminate but statistically  highly likely benign if the patient has no known malignancy. Consider  follow up with adrenal protocol CT in 12 months. Hormonal evaluation is  recommended. Consider endocrinology referral     DXA (10/26/2015):  FINDINGS:  Anatomic Location: AP LUMBAR SPINE  BMD (gm/cm2): 1.032   SD above (+)/below, (-) T score: -0.1   SD above (+)/below, (-)  Z score: 1.8    Anatomic Location: LEFT FEMORAL NECK   BMD (gm/cm2): 0.585   SD above (+)/below, (-) T score: -2.4   SD above (+)/below, (-) Z score: -0.7    Anatomic Location: LEFT TOTAL HIP  BMD (gm/cm2): 1.100   SD above (+)/below, (-) T score:  1.3   SD above (+)/below, (-) Z score: 2.7     Impression: This is a 71yrold female with Diabetes Mellitus Type II who presents to Endocrinology for follow up. Overall, her glycemic control near goal as based on her most recent A1c of 6.1%, however, she is due to follow up now. Based on her blood glucose readings from her glucometer download today, I suspect that this value would be higher as her blood glucose is consistently running higher. I have asked Ms. Schweer to increase her glipizide XR dose to 20 mg daily. We discussed the increased risk of hypoglycemia with this dose increase and Ms. MKosinskiwas instructed to decrease her glipizide dose back to 10 mg daily and notify me should she have hypoglycemia.      She also has a history of an elevated alk phos level. GGT was elevated which suggests a liver source and she was found to have an enlarged liver with increased echogenicity suggestive of fatty infiltration on abdominal ultrasound in 12/2013. Her last alk phos in 02/2016 was in the normal range. Fibroscan is consistent with NASH.     DIABETES HISTORY  (-) h/o retinopathy.      (-) h/o microalbuminuria/nephropathy.  (-) h/o neuropathy.  (-) h/o Autonomic dysfunction  (-) h/o Gastroparesis  (-) h/o CAD  (-) h/o PVD     Last eye exam: 04/2015, f/u due now   Last urine microalbumin: 08/2015, 283  Pneumovax: 12/2014  Influenza vaccine: 02/2016  (+) ASA  (-) Statin  (+) ACE/ARB             Recommendations:  (E11.29) Type 2 diabetes mellitus with other diabetic kidney complication  (primary encounter diagnosis)  - No changes made to Metformin dose today  - Increase glipizide XR to 20 mg daily  - Discussed dietary modifications for improved glycemic control   - Encouraged patient to continue blood glucose monitoring to at least 1x/day, goal to check 2x/day  - Encouraged patient to notify me should she have any hypoglycemia after glipizide dose increase  - Last LDL at goal, patient self d/c'ed statin, repeat fasting  lipid panel now  - Blood pressure at goal, continue amlodipine, cozaar    - Last microalbumin:creatinine ratio elevated, continue cozaar, repeat now  - Up to date on screening eye exam, next due 04/2016, f/u with ophthalmology now  - Check A1c now  - Check vitamin B12 level now given low value previously, patient currently on supplementation     2. Adrenal nodule  - Biochemical evaluation normal, repeat due in 01/2017 (~1 year from previous)  - Repeat imaging in ~12/2016 (~1 year from previous)      Approximately 30 minutes were spent with patient, greater than 50% of which was spent counseling the patient on diabetes management and on coordination of care.     Follow up in 3 months    If you have any questions, please do not hesitate to contact me at [phone]  Thank you for allowing me to participate in the care of this patient.    EDUCATION:  I educated/instructed the patient or caregiver regarding all aspects of the above stated  plan of care.  The patient or caregiver indicated understanding.      [place] interpreter was not used.    Report electronically signed by:  Sunday Spillers, M.D.  Clinical Assistant Professor  Department of Endocrinology  Pager # [phone]

## 2016-07-01 ENCOUNTER — Encounter
Admission: RE | Admit: 2016-07-01 | Discharge: 2016-07-06 | Disposition: A | Discharge status: Home / Self Care | Payer: Medicare Other | Source: Ambulatory Visit | Attending: GASTROENTEROLOGY | Admitting: GASTROENTEROLOGY

## 2016-07-01 ENCOUNTER — Encounter
Admission: RE | Admit: 2016-07-01 | Discharge: 2016-07-06 | Disposition: A | Discharge status: Home / Self Care | Payer: Medicare Other | Source: Ambulatory Visit | Attending: Nuclear Medicine | Admitting: Nuclear Medicine

## 2016-07-01 ENCOUNTER — Encounter: Payer: Self-pay | Admitting: GASTROENTEROLOGY

## 2016-07-01 DIAGNOSIS — R112 Nausea with vomiting, unspecified: Secondary | ICD-10-CM

## 2016-07-01 DIAGNOSIS — D3A00 Benign carcinoid tumor of unspecified site: Secondary | ICD-10-CM

## 2016-07-02 ENCOUNTER — Other Ambulatory Visit: Payer: Self-pay | Admitting: GASTROENTEROLOGY

## 2016-07-02 MED ORDER — METOCLOPRAMIDE 10 MG TABLET
10.0000 mg | ORAL_TABLET | Freq: Four times a day (QID) | ORAL | 0 refills | Status: DC
Start: 2016-07-02 — End: 2016-09-30

## 2016-07-04 ENCOUNTER — Ambulatory Visit: Payer: Medicare Other | Admitting: GASTROENTEROLOGY

## 2016-07-04 VITALS — BP 146/83 | HR 94 | Temp 98.1°F | Resp 15 | Ht 62.0 in | Wt 182.1 lb

## 2016-07-04 DIAGNOSIS — K7581 Nonalcoholic steatohepatitis (NASH): Secondary | ICD-10-CM

## 2016-07-04 DIAGNOSIS — K3184 Gastroparesis: Secondary | ICD-10-CM

## 2016-07-04 NOTE — Nursing Note (Signed)
Patient's blood pressure was high upon rooming patient, will have patient relax for several minutes and recheck blood pressure and reported to MD.    Patient denies chest pain, shortness of breath, and/or dizziness.     Verlon Setting, LVN

## 2016-07-04 NOTE — Progress Notes (Signed)
Hepatology Outpatient Progress Note    Patient Name: Paula Daugherty  MRN: [id number]  Date of Service: 07/04/2016  Referring Provider: [name], MD  Attending Physician: Georgeanne Nim MD, MPH    REASON FOR CONSULTATION: NASH, liver fibrosis    SUBJECTIVE:  Paula Daugherty is a 71yrold female with history of DM and obesity BMI 32.  She has fatty liver seen on [place] which showed fat in her liver without splenomegaly and a liver fibrosis score of 12.9 (F3) suggestive of severe fibrosis.  She is asymptomatic at this time.  She is trying to watch her diet and does not exercise.  ALT level is within normal limits.    EGD was done 02/2016 which showed a 0.7 cm duodenal polyp.  It was snared and pathology was consistent with carcinoid.  Her post-EGD course was complicated by post polypectomy bleeding which required [facility]  Octreotide scan was done and negative for residual disease.  Repeat EGD was done 06/2016 which did not show any evidence of residual carcinoid.    She has had chronic, intermittent nausea and vomiting.  Gastric emptying study shows more than expected retention at 4 hours suggestive of gastroparesis.  Reglan was started.    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    Psychiatric illness        ALLERGIES:    Contrast Dye [Radiopaque Agent]    Hives  Hydrochlorothiazide    Other-Reaction in Comments    Comment:Patient reports  Januvia [Sitagliptin]    Other-Reaction in Comments    Comment:Patient reports  Lyrica [Pregabalin]    Other-Reaction in Comments    Comment:Patient reports  Omnipaque [Iohexol]    Other-Reaction in Comments    Comment:Patient reports  Prozac [Fluoxetine Hcl]    Other-Reaction in Comments    Comment:Patient reports  Sulfa  (Sulfonamide Antibiotics)    Rash  Topiramate    Other-Reaction in Comments    Comment:Hairfall    MEDICATIONS:  Current Outpatient Prescriptions on File Prior to Visit   Medication Sig Dispense Refill    Amlodipine (NORVASC) 10 mg Tablet TAKE 1 TABLET BY MOUTH EVERY DAY. 90 tablet 1    Atorvastatin (LIPITOR) 10 mg Tablet Take 1 tablet by mouth every day at bedtime. 90 tablet 3    FLUTICASONE/SALMETEROL (ADVAIR DISKUS INHA) Take  by inhalation.      GlipiZIDE (GLUCOTROL XL) 10 mg SR Tablet Take 2 tablets by mouth every morning with a meal. (diabetes) 60 tablet 5    Losartan (COZAAR) 25 mg Tablet Take 1 tablet by mouth every day. 90 tablet 3    Metformin (GLUCOPHAGE) 1,000 mg tablet Take 1 tablet by mouth 2 times daily with meals. (diabetes) 180 tablet 3    Metoclopramide (REGLAN) 10 mg Tablet Take 1 tablet by mouth 4 times daily. 30 minutes before each meal 360 tablet 0    Omeprazole (PRILOSEC) 20 mg Delayed Release Capsule Take 1 capsule by mouth two times daily before meals. 60 capsule 0    Ondansetron (ZOFRAN ODT) 8 mg disintegrating tablet Take 1 tablet by mouth every 8 hours if needed. 50 tablet 1    Ondansetron (ZOFRAN, AS HYDROCHLORIDE,) 8  mg Tablet Take 1 tablet by mouth every 8 hours if needed. 270 tablet 3    ONETOUCH ULTRA TEST Strips USE FOR TESTING BLOOD GLUCOSE 3 TIMES PER DAY 300 strip 11    Triamcinolone (KENALOG) 0.1 % Cream Apply to the affected area 2 times daily. Use twice a day for two weeks on the chest lesion. 30 g 1    Venlafaxine ([name]) 75 mg Tablet TAKE 1 TABLET BY MOUTH 3 TIMES DAILY. 90 tablet 1     No current facility-administered medications on file prior to visit.        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  Eyes: negative.  Ears, Nose, Mouth, Throat: negative.  CV: negative.  Resp: negative.  GI: vomiting, nausea.  GU: negative.  Musculoskeletal: negative.  Integumentary: negative.  Neuro: negative.  Psych: Mood pt's report, euthymic.  Endo: negative.  Heme/Lymphatic: negative.  Allergy/Immun: negative.     PHYSICAL EXAMINATION:  Temp: --  Temp src: --  Pulse: --  BP: --  Resp: --  SpO2: --  Height: --  Weight: --    GENERAL: well appearing, well nourished.  ABDOMEN: Soft, nontender, and nondistended.  Normal bowel sounds.  No hepatosplenomegaly was noted.  EXTREMITIES: Without any cyanosis, clubbing, or edema.  NEUROLOGIC: Alert and oriented x4, no asterixis  PSYCHIATRIC: Normal mood and affect.  SKIN: No rashes seen.    LABORATORY EXAMINATIONS:   Lab Results   Lab Name Value Date/Time    NA 141 02/29/2016 10:31 AM    NA 142 08/30/2015 08:35 AM    K 4.1 02/29/2016 10:31 AM    K 4.0 08/30/2015 08:35 AM    CL 103 02/29/2016 10:31 AM    CL 104 08/30/2015 08:35 AM    CO2 28 02/29/2016 10:31 AM    CO2 22 (L) 08/30/2015 08:35 AM    BUN 21 02/29/2016 10:31 AM    BUN 19 08/30/2015 08:35 AM    CR 1.10 02/29/2016 10:31 AM    CR 0.88 08/30/2015 08:35 AM    GLU 133 (H) 02/29/2016 10:31 AM    GLU 139 (H) 08/30/2015 08:35 AM  Lab Results   Lab Name Value Date/Time    WBC 9.3 03/31/2016 12:29 PM    WBC 8.9 08/30/2015 08:35 AM    HGB 12.5  03/31/2016 12:29 PM    HGB 14.4 08/30/2015 08:35 AM    HCT 37.8 03/31/2016 12:29 PM    HCT 44.6 08/30/2015 08:35 AM    PLT 261 03/31/2016 12:29 PM    PLT 217 08/30/2015 08:35 AM     Lab Results   Lab Name Value Date/Time    AST 44 (H) 02/29/2016 10:31 AM    AST 71 (H) 08/30/2015 08:35 AM    ALT 46 02/29/2016 10:31 AM    ALT 69 (H) 08/30/2015 08:35 AM    ALP 100 02/29/2016 10:31 AM    ALP 104 08/30/2015 08:35 AM    ALB 3.8 02/29/2016 10:31 AM    ALB 4.2 08/30/2015 08:35 AM    TP 7.6 02/29/2016 10:31 AM    TP 8.5 (H) 08/30/2015 08:35 AM    TBIL 0.5 02/29/2016 10:31 AM    TBIL 0.9 08/30/2015 08:35 AM     Lab Results   Component Value Date    INR 1.00 01/08/2016     Lab Results   Component Value Date    VITD25 34.4 03/17/2016       VIRAL SEROLOGIES:  No results found for this or any previous visit.  Lab Results   Component Value Date    HBSAG Nonreactive 12/08/2013     Lab Results   Component Value Date    HEPBABQT 153.46 12/08/2013     No results found for: ANTIHBCT  No results found for this or any previous visit.  No results found for this or any previous visit.  No results found for this or any previous visit.    Lab Results   Component Value Date    HEPCABSCR Nonreactive 12/08/2013     No results found for this or any previous visit.  No results found for this or any previous visit.  No results found for: HIV1AND2SCR    AUTOIMMUNE:  No results found for: ANASCREEN  No results found for: IGG  No results found for this or any previous visit.  No results found for this or any previous visit.    OTHER:  Lab Results   Component Value Date    FRTN 15 02/29/2016     No results found for this or any previous visit.  No results found for this or any previous visit.    STUDIES:  Colonoscopy 2015  DIAGNOSIS                  A.  CECUM, POLYP (SNARE POLYPECTOMY):       -  Fragments of tubular adenoma (see Comment)       -  No high-grade dysplasia or malignancy        B.  RECTUM, ULCER (BIOPSY):        -  Colonic mucosa with focal erosion and reactive epithelial changes       -  No dysplasia or malignancy       -  Deeper levels examined     EGD 02/2016   A.  DUODENUM, POLYP (BIOPSY):       -  Well differentiated neuroendocrine tumor, Grade 1, transected, see         comment       -  Maximum size: 7.5 mm (measured on slide)       -  Ki67 <2%       -  Synaptophysin positive        Comment: This case has been reviewed in intradepartmental consultation.        Assessment and Plan:  1. NASH: based on Fibroscan, she has F3 disease however, this is not the gold standard.  We discussed the role of biopsy in confirming her fibrosis stage, but she will be aggressive about her lifestyle modifications to lose weight which may reverse her fibrosis.    2. Gastroparesis confirmed by gastric emptying study 06/2016: ?long term complication of her diabetes, I have started her on reglan 10 mg PO (4 tablets maximum per day).  This cannot be a long term drug as it is associated with permanent neurologic side effects like tardive dyskinesia.  If needed after the 12 week period, we can try erythromycin.  She was told to take as little of the reglan as possible.  She requests a nutrition referral.    3. Carcinoid: no evidence of residual disease and surveillance PET and EGD, follow up with Dr. Marti Sleigh (oncology).       Orders Placed This Encounter    NUTRITION REFERRAL     Return in about 3 months (around 10/04/2016).    Georgeanne Nim MD, MPH  Assistant Professor of Internal Medicine  Division of Gastroenterology and Hepatology  [address], [id number], [place], [place] [address]  Tel: [phone]  Fax: [phone]  [email or web address] .Bennie Pierini

## 2016-07-04 NOTE — Patient Instructions (Signed)
Gastroparesis: Care Instructions  Your Care Instructions     When you have gastroparesis, your stomach takes a lot longer to empty. This delay can cause belly pain, bloating, and belching. It also can cause hiccups, heartburn, nausea or vomiting. You may not feel like eating. These symptoms may come and go. They most often occur during and after meals. You may feel full after only a few bites of food.  This condition occurs when the nerves to the stomach don't work properly. Diabetes is the most common cause of this nerve damage. Gastroparesis can make it harder to control your blood sugar levels. But keeping your blood sugar levels under control may help with your symptoms. Parkinson's disease, stroke, and some medicines can also cause this condition.  Home treatment can often help.  Follow-up care is a key part of your treatment and safety. Be sure to make and go to all appointments, and call your doctor if you are having problems. It's also a good idea to know your test results and keep a list of the medicines you take.  How can you care for yourself at home?   Eat several small meals each day rather than three large meals.   Eat foods that are low in fiber and fat.   If your doctor suggests it, take medicines that help the stomach empty more quickly. These are called motility agents.  When should you call for help?  Call your doctor now or seek immediate medical care if:   You have new or worse belly pain.   Your belly pain lasts longer than 24 hours and is not getting better.  Watch closely for changes in your health, and be sure to contact your doctor if:   You have digestive problems that get worse or occur more often.   Where can you learn more?   Go to http://blackburn.com/  Enter M106 in the search box to learn more about "Gastroparesis: Care Instructions."    2006-2015 Healthwise, Inc[employer]. Care instructions adapted under license by [facility]. This care  instruction is for use with your licensed healthcare professional. If you have questions about a medical condition or this instruction, always ask your healthcare professional. [place] any warranty or liability for your use of this information.  Content Version: 10.6.465758; Current as of: Sep 19, 2013

## 2016-07-09 ENCOUNTER — Ambulatory Visit: Payer: Medicare Other | Attending: "Endocrinology

## 2016-07-09 DIAGNOSIS — E119 Type 2 diabetes mellitus without complications: Secondary | ICD-10-CM | POA: Insufficient documentation

## 2016-07-09 DIAGNOSIS — K922 Gastrointestinal hemorrhage, unspecified: Secondary | ICD-10-CM

## 2016-07-09 LAB — CBC WITH DIFFERENTIAL
Basophils % Auto: 0.9 %
Basophils Abs Auto: 0.1 10*3/uL (ref 0.0–0.2)
Eosinophils % Auto: 3.7 %
Eosinophils Abs Auto: 0.3 10*3/uL (ref 0.0–0.5)
Hematocrit: 39.6 % (ref 36.0–46.0)
Hemoglobin: 12.9 g/dL (ref 12.0–16.0)
Lymphocytes % Auto: 29.2 %
Lymphocytes Abs Auto: 2.3 10*3/uL (ref 1.0–4.8)
MCH: 25.7 pg — ABNORMAL LOW (ref 27.0–33.0)
MCHC: 32.7 % (ref 32.0–36.0)
MCV: 78.5 UM3 — ABNORMAL LOW (ref 80.0–100.0)
MPV: 8.3 UM3 (ref 6.8–10.0)
Monocytes % Auto: 7.5 %
Monocytes Abs Auto: 0.6 10*3/uL (ref 0.1–0.8)
Neutrophils % Auto: 58.7 %
Neutrophils Abs Auto: 4.7 10*3/uL (ref 1.8–7.7)
Platelet Count: 239 10*3/uL (ref 130–400)
RDW: 16.2 % — ABNORMAL HIGH (ref 0.0–14.7)
Red Blood Cell Count: 5.04 10*6/uL (ref 4.00–5.20)
White Blood Cell Count: 8 10*3/uL (ref 4.5–11.0)

## 2016-07-09 LAB — COMPREHENSIVE METABOLIC PANEL
Alanine Transferase (ALT): 59 U/L — ABNORMAL HIGH (ref 5–54)
Albumin: 3.9 g/dL (ref 3.2–4.6)
Alkaline Phosphatase (ALP): 116 U/L — ABNORMAL HIGH (ref 35–115)
Aspartate Transaminase (AST): 59 U/L — ABNORMAL HIGH (ref 15–43)
Bilirubin Total: 0.7 mg/dL (ref 0.3–1.3)
Calcium: 9 mg/dL (ref 8.6–10.5)
Carbon Dioxide Total: 25 mmol/L (ref 24–32)
Chloride: 103 mmol/L (ref 95–110)
Creatinine Serum: 0.99 mg/dL (ref 0.44–1.27)
E-GFR Creatinine (Male): 59 mL/min/{1.73_m2}
E-GFR, African American (Male): >60 mL/min/{1.73_m2}
Glucose: 150 mg/dL — ABNORMAL HIGH (ref 70–99)
Potassium: 3.6 mmol/L (ref 3.3–5.0)
Protein: 7.9 g/dL (ref 6.3–8.3)
Sodium: 138 mmol/L (ref 135–145)
Urea Nitrogen, Blood (BUN): 15 mg/dL (ref 8–22)

## 2016-07-09 LAB — LIPID PANEL
Cholesterol: 221 mg/dL — ABNORMAL HIGH (ref 0–200)
HDL Cholesterol: 56 mg/dL (ref >=35)
LDL Cholesterol Calculation: 123 mg/dL (ref <130)
Non-HDL Cholesterol: 165 mg/dL — ABNORMAL HIGH (ref <=150)
Total Cholesterol: HDL Ratio: 3.9 (ref <4.0)
Triglyceride: 210 mg/dL — ABNORMAL HIGH (ref 35–160)

## 2016-07-09 LAB — MICROALBUMIN
Creatinine Spot Urine: 261.23 mg/dL
Microalbumin Urine: 173.8 mg/dL
Microalbumin/Creatinine Ratio: 665 mg/g — ABNORMAL HIGH (ref <30)

## 2016-07-09 LAB — CREATININE SPOT URINE: Creatinine Spot Urine: 261.23 mg/dL

## 2016-07-10 LAB — HEMOGLOBIN A1C
Hgb A1C,Glucose Est Avg: 183 mg/dL
Hgb A1C: 8 % — ABNORMAL HIGH (ref 3.9–5.6)

## 2016-07-14 ENCOUNTER — Encounter: Payer: Self-pay | Admitting: Neurology

## 2016-07-14 ENCOUNTER — Ambulatory Visit: Payer: Medicare Other | Attending: Neurology | Admitting: Neurology

## 2016-07-14 VITALS — BP 133/81 | HR 75 | Temp 97.9°F | Resp 16 | Ht 62.0 in | Wt 183.0 lb

## 2016-07-14 DIAGNOSIS — Z79899 Other long term (current) drug therapy: Secondary | ICD-10-CM | POA: Insufficient documentation

## 2016-07-14 DIAGNOSIS — G4719 Other hypersomnia: Secondary | ICD-10-CM | POA: Insufficient documentation

## 2016-07-14 DIAGNOSIS — G4710 Hypersomnia, unspecified: Secondary | ICD-10-CM

## 2016-07-14 DIAGNOSIS — F515 Nightmare disorder: Secondary | ICD-10-CM | POA: Insufficient documentation

## 2016-07-14 DIAGNOSIS — F418 Other specified anxiety disorders: Secondary | ICD-10-CM | POA: Insufficient documentation

## 2016-07-14 NOTE — Progress Notes (Signed)
[facility] - Follow-Up Visit    DATE:        07/14/2016  Paula Daugherty  [address]  [place] [address]  1946/04/07  [phone] (home)   [phone]      PCP:   [name], MD  [place] [address]      Dear  Dr. [name], MD,       Thank you for allowing me to see your patient, Paula Daugherty, who is a  37yrfemale with a past medical history of carcinoid tumor, diabetes mellitus type 2, hypertension, GERD, fibromyalgia, and depression who was seen today in follow-up for daytime sleepiness, fatigue, and nightmares.     In brief, the patient was last seen in the Sleep Clinic on 05/01/15. The patient's diagnostic sleep study 02/09/14 showed an AHI of 4.3/hr and min O2 sat of 88%. The sleep efficiency was 90%. Sleep latency was normal at 2 minutes. The REM latency was delayed at 263 minutes. The periodic limb movement index wa snormal at 4.8/hr. The patient was started on modafinil but it does not appear the patient ever started this (? Insurance denial). Subsequently started the patient on venlafaxine within the context of the patient's depression, sleepiness, and disturbing dreams. Since that time the patient's symptoms have improved somewhat on venlafaxine in terms of sleepiness and mood but persistent. She continues to experience a number of life stressors. She will continue to having crying spells. She feels she is doing better overall on the balance with the venlafaxine. Her biggest sleep issue is with vivid disturbing dreams which is occurring every night. She feels it occurs all night. Her husband feels this occurs once a week. It occurs 2-3 AM.     Today, the Epworth Sleepiness Scale was :    12 /24, >10 is suggestive of sleepiness. The patient does not have drowsiness with driving.    Sleep Review of Systems:  Sleepiness, non-restorative sleep, fatigue; no insomnia, breath holding, gasping, choking, snoring, breathing  interruptions.    No difficulty falling asleep, difficulty staying asleep, waking too early.    Excessive daytime sleepiness; she is unsure if she has ever had cataplexy; she reports hypnagogic/hypnopompic hallucinations but attributes this to a medication that she used to take; no sleep paralysis.     Has woken up confused; no sleep walking, night terrors, sleep eating; possible dream reenactment; no nightmares, bed wetting, sleep talking.    No urge to move the legs, uncomfortable sensation in the legs; no frequent leg movements in sleep, leg cramps, teeth grinding.    General Review of Systems:  Constitutional: negative.  Eyes: negative.  Ears, Nose, Mouth, Throat: negative.  CV: negative.  Resp: negative.  GI: negative.  GU: negative.  Musculoskeletal: negative.  Integumentary: negative.  Neuro: negative.  Psych: Mood pt's report, depressed mood, crying spells, anxiety.  Endo: negative.  Heme/Lymphatic: negative.  Allergy/Immun: negative.    Medications:  Current Outpatient Prescriptions on File Prior to Visit   Medication Sig Dispense Refill    Amlodipine (NORVASC) 10 mg Tablet TAKE 1 TABLET BY MOUTH EVERY DAY. 90 tablet 1    Atorvastatin (LIPITOR) 10 mg Tablet Take 1 tablet by mouth every day at bedtime. 90 tablet 3    FLUTICASONE/SALMETEROL (ADVAIR DISKUS INHA) Take  by inhalation.      GlipiZIDE (GLUCOTROL XL) 10 mg SR Tablet Take 2 tablets by mouth every morning with a meal. (  diabetes) 60 tablet 5    Losartan (COZAAR) 25 mg Tablet Take 1 tablet by mouth every day. 90 tablet 3    Metformin (GLUCOPHAGE) 1,000 mg tablet Take 1 tablet by mouth 2 times daily with meals. (diabetes) 180 tablet 3    Metoclopramide (REGLAN) 10 mg Tablet Take 1 tablet by mouth 4 times daily. 30 minutes before each meal 360 tablet 0    Omeprazole (PRILOSEC) 20 mg Delayed Release Capsule Take 1 capsule by mouth two times daily before meals. 60 capsule 0    Ondansetron (ZOFRAN ODT) 8 mg disintegrating tablet Take 1 tablet  by mouth every 8 hours if needed. 50 tablet 1    Ondansetron (ZOFRAN, AS HYDROCHLORIDE,) 8 mg Tablet Take 1 tablet by mouth every 8 hours if needed. 270 tablet 3    ONETOUCH ULTRA TEST Strips USE FOR TESTING BLOOD GLUCOSE 3 TIMES PER DAY 300 strip 11    Triamcinolone (KENALOG) 0.1 % Cream Apply to the affected area 2 times daily. Use twice a day for two weeks on the chest lesion. 30 g 1    Venlafaxine ([name]) 75 mg Tablet TAKE 1 TABLET BY MOUTH 3 TIMES DAILY. 90 tablet 1     No current facility-administered medications on file prior to visit.        Allergies:  Allergies   Allergen Reactions    Contrast Dye [Radiopaque Agent] Hives    Hydrochlorothiazide Other-Reaction in Comments     Patient reports    Januvia [Sitagliptin] Other-Reaction in Comments     Patient reports    Lyrica [Pregabalin] Other-Reaction in Comments     Patient reports    Omnipaque [Iohexol] Other-Reaction in Comments     Patient reports    Prozac [Fluoxetine Hcl] Other-Reaction in Comments     Patient reports    Sulfa (Sulfonamide Antibiotics) Rash    Topiramate Other-Reaction in Comments     Hairfall       Past Medical History:  Patient Active Problem List   Diagnosis    DM2 (diabetes mellitus, type 2)    Depression with anxiety    Stress at home    Leg cramps-right calf    Right ear pain    Fibromyalgia    HTN (hypertension)    GERD (gastroesophageal reflux disease)    Hyperlipidemia with target LDL less than 70    Vitamin D insufficiency    Abnormal LFTs    Renal cyst ( 6.4 cm cyst left kidney)    Cough    Type 2 diabetes mellitus without complication    Fatty liver    Macroalbuminuric diabetic nephropathy    History of actinic keratoses    Cataract    Ptosis of eyelid    Liver fibrosis    Adrenal nodule    Medication Therapy Auth    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    Mixed conductive and sensorineural hearing loss of both ears    Neoplasm of uncertain behavior of skin        Examination:    Vitals:  BP 133/81  Pulse 75  Temp 36.6 C (97.9 F) (Tympanic)  Resp 16  Ht 1.575 m (5' 2" )  Wt 83 kg (182 lb 15.7 oz)  LMP 05/06/1983  SpO2 96%  BMI 33.47 kg/m2    Today Body Mass Index:   Body mass index is 33.47 kg/(m^2).  Marland Kitchen  General: No acute distress. Tearful at times.   HEENT: Mallampati Classification: 4. Class 1 dental occlusion.  Cardiovascular: Regular rate and rhythm. No murmurs, clicks, or gallops.   Respiratory: Lungs clear to auscultation. No rales, rubs, or rhonchi.  Abdominal: soft, non-tender, non-distended, bowel sounds present.  Extremities: Palpable peripheral pulses x 4 extremities. No clubbing, cyanosis, or edema.  Skin: No rash.    Neurological Exam:  Mental status: Awake, alert, and oriented to person, place, and time. Language is fluent with intact comprehension, naming, and repetition.  Gait: normal  narrow stance.    Diagnostic Studies:    Lab Results   Lab Name Value Date/Time    TSH 2.26 12/11/2015 08:49 AM    TSH 1.22 08/30/2015 08:35 AM    FT4 0.79 12/11/2015 08:49 AM        Lab Results   Lab Name Value Date/Time    WBC 8.0 07/09/2016 11:57 AM    WBC 8.9 08/30/2015 08:35 AM    HGB 12.9 07/09/2016 11:57 AM    HGB 14.4 08/30/2015 08:35 AM    HCT 39.6 07/09/2016 11:57 AM    HCT 44.6 08/30/2015 08:35 AM    PLT 239 07/09/2016 11:57 AM    PLT 217 08/30/2015 08:35 AM       Lab Results   Lab Name Value Date/Time    CO2 25 07/09/2016 11:57 AM    CO2 22 (L) 08/30/2015 08:35 AM       Lab Results   Lab Name Value Date/Time    HGBA1C 8.0 (H) 07/09/2016 11:57 AM    HGBA1C 6.6 (H) 08/30/2015 08:35 AM       Lab Results   Lab Name Value Date/Time    FRTN 15 02/29/2016 10:31 AM       Lab Results   Lab Name Value Date/Time    B12 197 (L) 03/17/2016 10:32 AM       Impression:    Primary Sleep Disorder: Hypersomnolence, with Epworth 12/24, somewhat improved on venlafaxine, likely within the context of depression. Nightmares, which the patient describes as vivid dreams  with negative content which cause her stress and anxiety. Type 2 narcolepsy remains on the differential but the patient has deferred testing for this condition. Napping as needed but not scheduled.   Circadian misalignment: None.   Medical Factors: None.  Pharmacologic Factors: None.   Psychiatric: Depression, tearful at times during interview when describing life stressors and prior emotional trauma.   Psychosocial Factors: None.     Plan:    - Discussed Image Rehearsal Therapy for nightmares.  - Also discussed prazosin but will defer at this time.  - Check-in in 1 month via MyChart.  - Continue venlafaxine 75 mg three tabs (2 AM, 1 PM) daily.  - Discussed the importance of scheduled naps (15 min - 1 hour).  - Recommend follow-up with mental health professional.  - I have asked this patient to make a follow up appointment in 3-4 months or sooner if needed.     The patient asked questions and the patient clearly understood.The patient was instructed and educated on all aspects of the plan of care. The patient acknowledged the plan of care. Symptoms that would merit an earlier follow up appointment were reviewed with the patient as well.    Thank you for allowing me to participate in the care of this patient.    Sincerely,    Electronically Signed 07/14/2016, 11:20 AM    [name], MD    Counselling and coordinating care took up more than 16 minutes of this 30 minute face-to-face  consultation. The topics included: sleep hygiene, drowsy driving and the importance of sleep and the importance of compliance with therapy.

## 2016-07-14 NOTE — Nursing Note (Signed)
Vital signs taken, allergies verified, screened for pain. Preferred pharmacy selected. Refills needed? No.Flordia Kassem MA II    Neck cir: 38.5 cm. Peterson Lombard MA II

## 2016-07-14 NOTE — Patient Instructions (Addendum)
You have excessive sleepiness, nightmares / vivid dreams, and signs and symptoms of depression.  - Continue venlafaxine 75 mg three tabs a day. This can help with sleepiness, mood, and REM suppression.   - Recommend Image Rehearsal therapy. Keep a journal of your negative dreams daily. Change the narrative for yourself.   - We also talked about prazosin but would defer this therapy at this time.    - Follow-up with your mental health provider.   - Send me a MyChart message in 1 month.   - Follow-up in the Sleep Clinic in 3-4 months.

## 2016-07-24 ENCOUNTER — Encounter: Payer: Self-pay | Admitting: Dermatology

## 2016-07-24 ENCOUNTER — Ambulatory Visit: Payer: Medicare Other | Admitting: Dermatology

## 2016-07-24 DIAGNOSIS — L708 Other acne: Secondary | ICD-10-CM

## 2016-07-24 DIAGNOSIS — L821 Other seborrheic keratosis: Secondary | ICD-10-CM

## 2016-07-24 NOTE — Progress Notes (Signed)
07/24/2016, last seen  06/05/2016.    I had the pleasure of seeing Paula Daugherty, a delightful 68yrfemale in follow up for the following    Chief concern: follow up spot on chest.     Past Medical History:  Prior history of skin cancer:  None.      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    Medication Therapy Auth 03/19/2016    Liver fibrosis 12/12/2015    Ptosis of eyelid 08/06/2015    Cataract 12/18/2014    History of actinic keratoses 12/06/2014    Macroalbuminuric diabetic nephropathy 09/12/2014    Fatty liver 08/28/2014    Type 2 diabetes mellitus without complication 045/40/9811   Cough 12/14/2013    Renal cyst ( 6.4 cm cyst left kidney) 08/24/2013    Hyperlipidemia with target LDL less than 70 07/28/2013    Vitamin D insufficiency 07/28/2013    Abnormal LFTs 07/28/2013    DM2 (diabetes mellitus, type 2) 07/11/2013    Depression with anxiety 07/11/2013    Stress at home 07/11/2013    Leg cramps-right calf 07/11/2013    Right ear pain 07/11/2013    Fibromyalgia 07/11/2013    HTN (hypertension) 07/11/2013    GERD (gastroesophageal reflux disease) 07/11/2013       Allergies: Contrast dye [radiopaque agent]; Hydrochlorothiazide; Januvia [sitagliptin]; Lyrica [pregabalin]; Omnipaque [iohexol]; Prozac [fluoxetine hcl]; Sulfa (sulfonamide antibiotics); and Topiramate     Medications:   Current Outpatient Prescriptions on File Prior to Visit   Medication Sig Dispense Refill    Amlodipine (NORVASC) 10 mg Tablet TAKE 1 TABLET BY MOUTH EVERY DAY. 90 tablet 1    Atorvastatin (LIPITOR) 10 mg Tablet Take 1 tablet by mouth every day at bedtime. 90 tablet 3    FLUTICASONE/SALMETEROL (ADVAIR DISKUS INHA) Take  by inhalation.      GlipiZIDE  (GLUCOTROL XL) 10 mg SR Tablet Take 2 tablets by mouth every morning with a meal. (diabetes) 60 tablet 5    Losartan (COZAAR) 25 mg Tablet Take 1 tablet by mouth every day. 90 tablet 3    Metformin (GLUCOPHAGE) 1,000 mg tablet Take 1 tablet by mouth 2 times daily with meals. (diabetes) 180 tablet 3    Metoclopramide (REGLAN) 10 mg Tablet Take 1 tablet by mouth 4 times daily. 30 minutes before each meal 360 tablet 0    Omeprazole (PRILOSEC) 20 mg Delayed Release Capsule Take 1 capsule by mouth two times daily before meals. 60 capsule 0    Ondansetron (ZOFRAN ODT) 8 mg disintegrating tablet Take 1 tablet by mouth every 8 hours if needed. 50 tablet 1    Ondansetron (ZOFRAN, AS HYDROCHLORIDE,) 8 mg Tablet Take 1 tablet by mouth every 8 hours if needed. 270 tablet 3    ONETOUCH ULTRA TEST Strips USE FOR TESTING BLOOD GLUCOSE 3 TIMES PER DAY 300 strip 11    Triamcinolone (KENALOG) 0.1 % Cream Apply to the affected area 2 times daily. Use twice a day for two weeks on the chest lesion. 30 g 1    Venlafaxine ([name]) 75 mg Tablet TAKE 1 TABLET BY MOUTH 3 TIMES DAILY. 90 tablet 1     No current facility-administered  medications on file prior to visit.      Family History:                                             There is not a family history of skin cancer, type: N/A     Social History:  The patient does not smoke or use tobacco products.  EtOH Use: 1 /month  Occupation: Retired    Automotive engineer  None.     Review of Systems:   Not reviewed today.     Physical Examination:    LMP 05/06/1983  On physical examination, the patient is well developed, well nourished, and pleasant with normal affect. The patient is alert to time, place, and person.    The following areas were inspected and found to be normal except as specified below:   Head/scalp including palpation, face, eyes, eyelids and conjunctiva, nose, mouth/oral mucosa, lips, neck, chest.    The following areas were inspected and found to be normal  except as specified below:     Impression/Problems/Plan:    Diagnosis: Macular seborrheic keratosis       C/O: The patient reported an irritated red spot for months duration involving the chest. States it has been red for about 1-2 months. Denies itching or pain to the area.  Past treatment: Triamcinolone 0.1% cream BID x 2 weeks.      PE:  [place], verrucous, stuck-on thin papule located on the chest.       Plan: The benign nature of these skin lesions was explained to the patient. No treatment is indicated at this time. The patient has been educated about self examination, and if changes occur including change in size,color or if lesions become symptomatic, the patient was instructed to return for follow up.     Diagnosis: Seborrheic keratoses       C/O: Brown spot for months duration involving the face. Symptoms are rough texture and brown color. Past/current treatment: none.      PE:  Multiple brown, verrucous, stuck-on papules on the left cheek.      Plan: The benign nature of these skin lesions was explained to the patient. No treatment is indicated at this time. The patient has been educated about self examination, and if changes occur including change in size,color or if lesions become symptomatic, the patient was instructed to return for follow up.     Diagnosis: Favor acne necrotica      C/O: The patient reports red tender bumps for months duration involving the scalp. Past/current treatment: none. Washes hair every 3 days.       PE: No specific lesion present upon examination today.       Plan:   -- Can apply a thin layer of Triamcinolone 0.1% cream BID for up to 4 days maximum..      Follow up: PRN    Education needs were addressed and there were no barriers to learning.     SCRIBE DISCLAIMER:   This note was scribed by Tomasa Hose, a trained medical scribe, in the presence of Venita Lick, MD  Electronically signed by Tomasa Hose, SCRIBE, 07/24/2016  1:54 PM      Agree with scribe documentation.      Venita Lick, MD  [employer] [facility]  Department of Dermatology

## 2016-07-24 NOTE — Nursing Note (Signed)
Screened for pain, allergies reviewed, no vitals taken - Joan Herschberger, MA ll

## 2016-07-31 ENCOUNTER — Ambulatory Visit: Payer: Medicare Other

## 2016-07-31 DIAGNOSIS — K3184 Gastroparesis: Secondary | ICD-10-CM

## 2016-07-31 NOTE — Progress Notes (Signed)
ADULT NUTRITION ASSESSMENT   Clinic: [facility]  07/31/2016    Reason For Assessment:   Referring Diagnosis: DM2, gastroparesis  Referring Provider: [name], MD    Nutrition Assessment   Paula Daugherty is a 71yrold female    Pertinent Medical History:    Cardiovascular: Hyperlipidemia and Hypertension  Endocrine/metabolism: Type 2 Diabetes mellitus and Obesity  Gastrointestinal: gastroparesis  Other: fatty liver    Patient/Other reports: Seeking diet control. Gastroparesis is recent diagnosis, conflicting info from reading online (fiber) - seeking advice. Recently stomach discomfort - pain at night. Reglan is new medication - sporadic vomiting declined over last few months. More portion control recently. Struggled with food choices, low activity. Snacking is habit. SMBG 1/day; fasting average 204mdL.    Social History:   Living/housing situation: lives with husband, husband does meal prep  Occupation: retired    FoHaematologistnd nutrition related history:    Previously prescribed diets: Diabetic  Previous nutrition education/counseling: [place] (2015)  Self selected diet/s followed: Regular    Diet Recall:    frequent frozen meals, fast food   Reduced evening snacking   Stopped buying ice cream  Breakfast: tea (honey, equal) SKIPS  Lunch (12pm): toast OR sandwich (1 slice bread + meat) OR burger king (4-10 chicken nuggets, 10oz regular soda) OR scrambled eggs + potatoes + bacon OR [employer] (beef burrito) OR Mongolian [employer]Q OR pizza OR MePolandSnack (3pm): doritos OR fruit cups (peaches, pears) OR hips + salsa  Dinner (6pm): 1/2 frozen pizza OR [employer] TV dinner OR grilled meat + asparagus/peaches + 1/2 baked potato  Snack (8pm): ice cream OR cookies OR graham crackers OR fruit cup  Beverages: herbal tea, occ coffee, 10oz soda (1/week), water  Eating Out: 4x/week    Food Allergies: NKFA    Physical Activity:   Physical activity history: ADLs, housework    Pertinent Labs:   Results for Paula, [name] [id number]  of 07/31/2016 13:03   Ref. Range 12/11/2015 08:49 03/17/2016 10:32 07/09/2016 11:57   HGB A1C Latest Ref Range: 3.9 - 5.6 % 6.7 (H) 6.1 (H) 8.0 (H)     Pertinent Medications: Metformin, glipizide, reglan, atorvastatin    Nutrition Focused Physical Findings:  Overall appearance: Body habitus: obese female    LMP 05/06/1983    Wt Readings from Last 5 Encounters:   07/14/16 83 kg (182 lb 15.7 oz)   07/04/16 82.6 kg (182 lb 1.6 oz)   06/13/16 83.2 kg (183 lb 6.8 oz)   06/10/16 82.6 kg (182 lb)   06/02/16 82.7 kg (182 lb 4.8 oz)     Ht Readings from Last 1 Encounters:   07/14/16 1.575 m (5' 2" )     Vitals (Multiple Entries) 07/14/2016   BMI 33.5     Weight History: stable  Usual Body Weight:  Current weight  Reference Weight:  58kg(165% IBW, adjusted for obesity)     Estimated Nutrition Needs:   1575 kcal/day [place] Jeor x 1.2 (AF)     46-58 gm protein/day 0.8-1 gm protein/kg (based on 58kg)     Estimated Needs for Weight Loss: ~1300kcals    Nutrition Diagnosis     Nutrition Diagnosis:  Altered nutrition-related laboratory values related to uncontrolled DM2 as evidenced by fasting BG of >20052mL and HgbA1c=8% (with upward trend over 4 months).  NC-2.2  Food-and nutrition- related knowledge deficit related to eating for gastroparesis and DM2 as evidenced by RD referral/pt questions.  NB-1.1    Pt  challenged with balancing gastroparesis diet with MNT for type 2 DM. Low fiber to support faster transit in conflict with high fiber for improved glycemic control. Suggested use of liquids/shakes when symptoms worsening. Suggested getting GI symptoms in control then slowly challenging system with  Lower glycemic index carbs. Encouraged ongoing RD support.    Nutrition Intervention     Nutrition Intervention:    1) Nutrition Education: Patient instructed on meal planning for type 2 diabetes & gastroparesis management.    Patient instructed on carbohydrate counting using food labels, written and online nutrition resources and  portion size guidelines.    Reviewed gastroparesis diet - as outlined in handout    Education needs identified: yes  Handouts provided: Gastroparesis  Patient verbalizes understanding of teaching instructions: yes  Barriers to learning assessed: none    Nutrition Monitoring & Evaluation     Nutrition Monitoring and Evaluation:   1) Types of food/meals    Goal: choose foods compatible with gastroparesis diet, as symptoms improve trial introduction of higher fiber foods to improve BG control    Goal: When symptoms worsened, trial shakes/liquids/blended foods for faster GI transit time.  Physical activity:  Duration    Goal: 30 minutes/day or 10 minute walk after meals  2) Glucose/Endocrine Profile:  HgbA1c:    Goal: <7%  3) GI Symptoms:   Goal: improvement in negative GI symptoms: pain/bloating    Minutes spent providing assessment and education:60    Report Electronically Signed by:    Theophilus Bones, MAS, RD, [place]  Pager: [phone]

## 2016-08-19 ENCOUNTER — Encounter: Payer: Self-pay | Admitting: Family Medicine

## 2016-08-19 ENCOUNTER — Ambulatory Visit: Payer: Medicare Other | Admitting: Family Medicine

## 2016-08-19 VITALS — BP 131/74 | HR 77 | Temp 98.1°F | Wt 179.2 lb

## 2016-08-19 DIAGNOSIS — H906 Mixed conductive and sensorineural hearing loss, bilateral: Secondary | ICD-10-CM

## 2016-08-19 DIAGNOSIS — E1121 Type 2 diabetes mellitus with diabetic nephropathy: Secondary | ICD-10-CM

## 2016-08-19 DIAGNOSIS — C7A8 Other malignant neuroendocrine tumors: Secondary | ICD-10-CM

## 2016-08-19 DIAGNOSIS — K3184 Gastroparesis: Secondary | ICD-10-CM

## 2016-08-19 NOTE — Progress Notes (Signed)
Paula Daugherty is a 71yrfemale who presents with a chief complaint of "here for follow-up with PCP".    Chief Complaint   Patient presents with    Diabetes       1. DM2: A1c with jump to 8 from less than 7 previously; Glipizide was previously increased; patient working on weight loss; patient reports she would welcome a possible medication that could help with weight loss; also off Lipitor but would consider going back to see if she had possible SE like hair loss   2. Gastroparesis: on Reglan; no apparent SE; GI symptoms overall better per patient   3. Neuroendocrine tumor: seeing DR TMarti Sleigh patient has follow-up in July       ROS:   .Constitutional: no fever/chills; intentional weight loss.  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 90 tablet 1    Atorvastatin (LIPITOR) 10 mg Tablet Take 1 tablet by mouth every day at bedtime. 90 tablet 3    FLUTICASONE/SALMETEROL (ADVAIR DISKUS INHA) Take  by inhalation.      GlipiZIDE (GLUCOTROL XL) 10 mg SR Tablet Take 2 tablets by mouth every morning with a meal. (diabetes) 60 tablet 5    Losartan (COZAAR) 25 mg Tablet Take 1 tablet by mouth every day. 90 tablet 3    Metformin (GLUCOPHAGE) 1,000 mg tablet Take 1 tablet by mouth 2 times daily with meals. (diabetes) 180 tablet 3    Metoclopramide (REGLAN) 10 mg Tablet Take 1 tablet by mouth 4 times daily. 30 minutes before each meal 360 tablet 0     Omeprazole (PRILOSEC) 20 mg Delayed Release Capsule Take 1 capsule by mouth two times daily before meals. 60 capsule 0    Ondansetron (ZOFRAN, AS HYDROCHLORIDE,) 8 mg Tablet Take 1 tablet by mouth every 8 hours if needed. 270 tablet 3    ONETOUCH ULTRA TEST Strips USE FOR TESTING BLOOD GLUCOSE 3 TIMES PER DAY 300 strip 11    Triamcinolone (KENALOG) 0.1 % Cream Apply to the affected area 2 times daily. Use twice a day for two weeks on the chest lesion. 30 g 1    Venlafaxine ([name]) 75 mg Tablet TAKE 1 TABLET BY MOUTH 3 TIMES DAILY. 90 tablet 1     No current facility-administered medications on file prior to visit.      Allergies:   Allergies   Allergen Reactions    Contrast Dye [Radiopaque Agent] Hives    Hydrochlorothiazide Other-Reaction in Comments     Patient reports    Januvia [Sitagliptin] Other-Reaction in Comments     Patient reports    Lyrica [Pregabalin] Other-Reaction in Comments     Patient reports    Omnipaque [Iohexol] Other-Reaction in Comments     Patient  reports    Prozac [Fluoxetine Hcl] Other-Reaction in Comments     Patient reports    Sulfa (Sulfonamide Antibiotics) Rash    Topiramate Other-Reaction in Comments     Hairfall       Social History     Social History    Marital status: MARRIED     Spouse name: N/A    Number of children: 1    Years of education: N/A     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       Social History Main Topics    Smoking status: Former Smoker     Packs/day: 0.10     Years: 20.00    Smokeless tobacco: Former Systems developer      Comment: quit 30 years ago    Alcohol use 0.0 oz/week     0 Standard drinks or equivalent per week      Comment: rare shares a beer or glass wine every 2-3 months     Drug use: No    Sexual activity: Yes     Partners: Male     Other Topics Concern    None     Social History Narrative     Family History   Problem Relation Age of Onset    Diabetes Father     Heart Mother      MI at 8s     Non-contributory Brother      Non-contributory Brother        PE:  BP 131/74  Pulse 77  Temp 36.7 C (98.1 F) (Temporal)  Wt 81.3 kg (179 lb 3.7 oz)  LMP 05/06/1983  BMI 32.78 kg/m2  .General Appearance: healthy, alert, no distress, pleasant affect, cooperative.  Ears:  Right side with small amount of cerumen was removed with curette; normal TMs and canal and external inspection of ears show no abnormality.  Heart:  normal rate and regular rhythm, no murmurs, clicks, or gallops.  Lungs: clear to auscultation.  Extremities:  no cyanosis, clubbing, or edema.  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, without long-term current use of insulin  (primary encounter diagnosis)  Comment: last A1c of 8  Plan: to route to DR [name]--patient is wondering if any changes are or additions are needed     (K31.84) Gastroparesis  Comment: on Reglan with reported benefit and no apparent SE  Plan: to monitor     (C7A.8) Neuroendocrine carcinoma of small bowel  Comment: seeing DR Marti Sleigh   Plan: patient to see DR Marti Sleigh for follow-up in July     (H90.6) Mixed conductive and sensorineural hearing loss of both ears  Comment: per previous evaluation   Plan: to monitor       Total encounter time including history, physical examination, and coordination of care was approximately 20 minutes of which more than 50% was spent counseling regarding assessment/diagnosis and treatment plan. No guarantees were made regarding her medical care or treatment outcome. Barriers to Learning: none.  Patient verbalizes understanding of teaching and instructions.    Electronically signed by:    [name], MD  [place], [place] Board of Family Medicine  Associate physician [facility], [place]   [phone]

## 2016-08-19 NOTE — Patient Instructions (Signed)
Patient see about possibly getting Shinrix from outside pharmacy.   Please also consider a Prevanar 13 vaccine.

## 2016-08-19 NOTE — Nursing Note (Signed)
Vital signs taken, allergies and pharmacy verified. Screened for pain. Med list given to patient for review.

## 2016-08-21 ENCOUNTER — Encounter: Payer: Self-pay | Admitting: Neurology

## 2016-08-21 NOTE — Telephone Encounter (Signed)
From: [name]  To: [name], MD  Sent: 08/21/2016 5:45 PM PDT  Subject: Preventive Care    Hi Dr. I have been trying to get better at coming up with a positive spin on my dreams but its been hard. The one I come up with the most is that many people in the dreams are dead and I'm not yet. The disturbing dreams continue but topics have changed. Part of the new problem is our daughter has stopped speaking to [place] and will not let us see our Grandson. Its all over her belief is that we didn't respect her husband when he refused to come home when she was rushed to the hospital Code 3. He was playing golf in [place] We are crushed by not seeing our Grandson. We are very close. Of course now my depression is really bad. I am feeling lost in this world with no way to turn to make it better. Anyway this is the update you requested. BTW, my counselor from [place] totally agreed with the PTSD. She just had never talked to me about that diagnosis.

## 2016-08-26 NOTE — Telephone Encounter (Signed)
Neuro / Sleep Med Chart Update 08/27/15:  The patient messaged me reporting worsening nightmares in context of family life stress. Also feeling depressed. Queried patient regarding mental health follow-up as well as negative thoughts of self harm. Queried if interested in increasing venlafaxine and/or trying prazosin which was previously discussed in context of PTSD-related nightmares. Having hard time changing negative narrative with [facility] Therapy. Awaiting reply.    Jenita Seashore, MD  Neuro / Sleep Med

## 2016-08-26 NOTE — Progress Notes (Signed)
Thanks for letting me know. I called and checked in with Ms. Spadafora who is travelling for the next few weeks ([place] for wedding, then to see her mother) and we will plan to check in again when she returns. She will contact me via mychart with any acute issues before this time.     Thanks!  Kailia Starry

## 2016-09-01 ENCOUNTER — Encounter: Payer: Self-pay | Admitting: Neurology

## 2016-09-15 ENCOUNTER — Telehealth: Payer: Self-pay | Admitting: Family Medicine

## 2016-09-15 DIAGNOSIS — F418 Other specified anxiety disorders: Secondary | ICD-10-CM

## 2016-09-15 MED ORDER — VENLAFAXINE 75 MG TABLET
1.0000 | ORAL_TABLET | Freq: Three times a day (TID) | ORAL | 1 refills | Status: DC
Start: 2016-09-15 — End: 2016-10-17

## 2016-09-15 NOTE — Telephone Encounter (Signed)
Spoke with Patient . Refill requested on [name].   RX verified. She will be picking up refill after return from [place] of state.  [place] working on locating a therapist.   Refill ordered.  Collier Flowers, RN

## 2016-09-25 ENCOUNTER — Ambulatory Visit: Payer: Medicare Other

## 2016-09-25 DIAGNOSIS — K3184 Gastroparesis: Secondary | ICD-10-CM

## 2016-09-25 NOTE — Patient Instructions (Addendum)
Nutrition Notes:    Trial protein shake    [employer]: Premier Protein (24 pack, 8oz)    [employer]: Pure Protein, 8oz. bottle       When experiencing a gastroparesis attack...   Trial liquids/shakes - to give stomach "rest"   Low fiber    When symptoms resolve, slowly add in more fiber ((more veggies, whole grains)    Healthy Eating:  Build a Balanced Meal  Protein: 4oz meat  Vegetable: 1/2 plate! The more the merrier   IDEAS: bbq/roasted veggies, stuffed bell pepper, zucchini  Fruit: higher fiber (berries, apples) OR any fruit you enjoy  Whole Grain Starchy Carb/GRain/Beans: brown rice, potato,       GOALS:  1) Walking    3 times/week for 10+ minutes   With husband or solo   After dinner    2) 3 Eating Episodes Per Day   Breakfast:    Shake (homemade OR premade) + fruit   Hardboiled eggs + toast/fruit   Yogurt + 1 spoonful peanut butter + fruit   Note: if canned fruit - canned in    own juice/water    Protein: 1 oz nuts OR 1-2 eggs OR leftover meat OR 1/2 cup plain [place] yogurt   Dinner/Evening   Add vegetables!!! (frozen veggies, canned/cup green beans)   Idea: 3 taquitos + veggies

## 2016-09-25 NOTE — Progress Notes (Signed)
ADULT NUTRITION ASSESSMENT   Clinic: [facility]  09/25/2016    Reason For Assessment:   Referring Diagnosis: DM2, gastroparesis  Referring Provider: [name], MD    Nutrition Assessment   Paula Daugherty is a 71yrold female    Pertinent Medical History:    Cardiovascular: Hyperlipidemia and Hypertension  Endocrine/metabolism: Type 2 Diabetes mellitus and Obesity  Gastrointestinal: gastroparesis  Other: fatty liver    Patient/Other reports: Recently stayed with mother for 2.5 weeks, difficulty staying on track. Trouble getting regular meals and protein. Bought protein shake - 1/day (kellog). Plans to do more bbq/lower fat cooking. Fasting BG is 1894md, did not test over last 2 weeks. Previously testing 2-3x/day. 2 episodes in last 2 months - dry heaves, stomach pain - question role of stress with care of mother.     Previous Report: Gastroparesis is recent diagnosis, conflicting info from reading online (fiber) - seeking advice. Recently stomach discomfort - pain at night. Reglan is new medication - sporadic vomiting declined over last few months.    Social History: husband attended visit  Living/housing situation: lives with husband, husband does meal prep  Occupation: retired  Stress: high due to care of ederly parents and daughter    Food and nutrition related history:    Previously prescribed diets: Diabetic  Previous nutrition education/counseling: [place] (2015 & 07/2016)  Self selected diet/s followed: Regular    Diet Recall:    Working on protein   Bought Special K shakes - 1/day    Breakfast: tea/coffee (honey, equal) SKIPS OR toast  Lunch (11am): Eaten out - eggs + bacon + toast OR ham and cheese omlet OR cereal (grapenuts)  Snack (3pm): fruit (canned peaches) OR protein drink OR jelly bellies OR peanut butter + crackers   Dinner (6pm): [employer] - 1/4 brisket + 1/2 baked potato, + beans  Snack (8pm): reduced OR potato chips   Beverages: herbal tea/coffee, occ coffee, water  Eating Out: 7x/week -  once/day    Food Allergies: NKFA    Physical Activity:   Physical activity history: ADLs, housework; started walking initially - none currently    Pertinent Labs:   Results for Paula Daugherty, Paula Daugherty [id number] of 07/31/2016 13:03   Ref. Range 12/11/2015 08:49 03/17/2016 10:32 07/09/2016 11:57   HGB A1C Latest Ref Range: 3.9 - 5.6 % 6.7 (H) 6.1 (H) 8.0 (H)     Pertinent Medications: Metformin, glipizide, reglan, atorvastatin    Nutrition Focused Physical Findings:  Overall appearance: Body habitus: obese female    Ht 1.575 m (5' 2" )  Wt 82.4 kg (181 lb 10.5 oz)  LMP 05/06/1983  BMI 33.23 kg/m2    Wt Readings from Last 5 Encounters:   08/19/16 81.3 kg (179 lb 3.7 oz)   07/14/16 83 kg (182 lb 15.7 oz)   07/04/16 82.6 kg (182 lb 1.6 oz)   06/13/16 83.2 kg (183 lb 6.8 oz)   06/10/16 82.6 kg (182 lb)     Ht Readings from Last 1 Encounters:   07/14/16 1.575 m (5' 2" )     Vitals (Multiple Entries) 07/14/2016   BMI 33.5     Weight Change: net loss of 2 pounds/2 months   Weight History: stable  Usual Body Weight:  Current weight  Reference Weight:  58kg(165% IBW, adjusted for obesity)     Estimated Nutrition Needs:   1575 kcal/day [place] Jeor x 1.2 (AF)     46-58 gm protein/day 0.8-1 gm protein/kg (based on 58kg)  Estimated Needs for Weight Loss: ~1300kcals    Nutrition Diagnosis     Nutrition Diagnosis:  Altered nutrition-related laboratory values related to uncontrolled DM2 as evidenced by fasting BG of >264m/dL and HgbA1c=8% (with upward trend over 4 months).  NC-2.2   Status: mild improvement per reports fasting BG of 1876mdL.  Food-and nutrition- related knowledge deficit related to eating for gastroparesis and DM2 as evidenced by RD referral/pt questions.  NB-1.1    Pt continues with confusing/challenge of balancing gastroparesis diet with MNT for type 2 DM. Low fiber to support faster transit in conflict with high fiber for improved glycemic control. Re-inforced use of liquids/shakes when symptoms worsening and  slowly challenging system with Lower glycemic index carbs as symptoms resolve.    Nutrition Intervention     Nutrition Intervention:    1) Nutrition Education: Review of DM2/weight management in setting of gastroparesis diet   Patient instructed on carbohydrate counting using food labels, written and online nutrition resources and portion size guidelines.    Pair protein + carb at each meal   Goal 45g carb/meal   Goal 15-20g protein/meal   3 meals/day   Use liquids/shakes when GI symptoms flair   Walk 30 minutes, 5x/week    Education needs identified: yes  Handouts provided: AVS  Patient verbalizes understanding of teaching instructions: yes  Barriers to learning assessed: none    Nutrition Monitoring & Evaluation     Nutrition Monitoring and Evaluation:   1) Types of food/meals    Goal: choose foods compatible with gastroparesis diet, as symptoms improve trial introduction of higher fiber foods to improve BG control    Goal: When symptoms worsened, trial shakes/liquids/blended foods for faster GI transit time.  Physical activity:  Duration    Goal: 10 minutes/day, 3x/week  2) Glucose/Endocrine Profile:  HgbA1c:    Goal: <7%  Fasting:   Goal: <15015ml to 80-130m42m  3) GI Symptoms:   Goal: improvement in negative GI symptoms: pain/bloating    Minutes spent providing assessment and education:45    Report Electronically Signed by:    EvelTheophilus BonesS, RD, IBCLOctager: [phone]

## 2016-09-26 ENCOUNTER — Encounter: Payer: Self-pay | Admitting: "Endocrinology

## 2016-09-26 ENCOUNTER — Ambulatory Visit: Payer: Medicare Other | Admitting: "Endocrinology

## 2016-09-26 VITALS — BP 131/84 | HR 76 | Wt 181.7 lb

## 2016-09-26 DIAGNOSIS — E1165 Type 2 diabetes mellitus with hyperglycemia: Secondary | ICD-10-CM

## 2016-09-26 DIAGNOSIS — E278 Other specified disorders of adrenal gland: Secondary | ICD-10-CM

## 2016-09-26 DIAGNOSIS — I10 Essential (primary) hypertension: Secondary | ICD-10-CM

## 2016-09-26 DIAGNOSIS — E279 Disorder of adrenal gland, unspecified: Secondary | ICD-10-CM

## 2016-09-26 DIAGNOSIS — E119 Type 2 diabetes mellitus without complications: Secondary | ICD-10-CM

## 2016-09-26 MED ORDER — AMLODIPINE 10 MG TABLET
1.0000 | ORAL_TABLET | Freq: Every day | ORAL | 3 refills | Status: DC
Start: 2016-09-26 — End: 2017-05-01

## 2016-09-26 MED ORDER — METFORMIN 1,000 MG TABLET: 1.0000 | ORAL_TABLET | Freq: Two times a day (BID) | ORAL | 3 refills | Status: DC

## 2016-09-26 MED ORDER — EMPAGLIFLOZIN 10 MG TABLET
10.0000 mg | ORAL_TABLET | Freq: Every day | ORAL | 11 refills | Status: DC
Start: 2016-09-26 — End: 2016-10-15

## 2016-09-26 MED ORDER — BLOOD SUGAR DIAGNOSTIC STRIPS: ORAL_STRIP | 11 refills | Status: DC

## 2016-09-26 MED ORDER — ATORVASTATIN 10 MG TABLET: 1.0000 | ORAL_TABLET | Freq: Every day | ORAL | 3 refills | Status: DC

## 2016-09-26 MED ORDER — LOSARTAN 25 MG TABLET
1.0000 | ORAL_TABLET | Freq: Every day | ORAL | 3 refills | Status: DC
Start: 2016-09-26 — End: 2017-05-01

## 2016-09-26 NOTE — Progress Notes (Addendum)
Endocrinology Follow up clinic note:    Chief Complaint:  "I'm here to follow up on my diabetes."    HPI: Paula Daugherty is a 71yrold female who has a diagnosis of type 2 diabetes mellitus who presents to Endocrinology for follow up. Her history is as follows:  She was initially diagnosed with diabetes around 2000 on routine labs. She has a strong family history of diabetes so she wasn't surprised at this. She was started on Metformin around 2005 and then she was started on insulin in 2014. She was up to 64 units of Lantus at night. However, after making changes to her diet and lifestyle, she was taken off insulin in 2015.        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     Interim History: She returns today for follow up. Her last visit with Endocrinology was on 06/13/2016. Since this visit, she states that life has been very stressful. She has been dealing with a new diagnosis of gastroparesis and has been trying to balance the gastroparesis diet with foods that are good for diabetes. She also has been dealing with a lot of personal stress as her mother is aging and dealing with progressive dementia and her daughter stopped speaking to her.   Her blood glucose has been running higher overall and she has had minimal but still occasional low blood sugar during the day.     Current Regimen:                          Metformin 1000 mg BID                        Glipizide 20 mg daily  Hypoglycemia: Does occasionally have symptoms but blood glucose is not low when checked  Hyperglycemia: Yes, has been running higher overall  Meter brought today: Yes  Checks blood glucose: 2x/day  Home blood glucose numbers per pt's log:    Blood Glucose:     Pre-breakfast: 157 to 311   Pre-dinner:141  Last eye exam: 06/03/2016, no retinopathy, f/u 1 year  Foot exam: Will do today    ROS:  All other systems were reviewed and are negatative except for pertinent positive and negative responses as  documented in HPI.     Medications:  Medication reconciliation was performed today.   Current Outpatient Prescriptions on File Prior to Visit   Medication Sig Dispense Refill    FLUTICASONE/SALMETEROL (ADVAIR DISKUS INHA) Take  by inhalation.      GlipiZIDE (GLUCOTROL XL) 10 mg SR Tablet Take 2 tablets by mouth every morning with a meal. (diabetes) 60 tablet 5    Metoclopramide (REGLAN) 10 mg Tablet Take 1 tablet by mouth 4 times daily. 30 minutes before each meal 360 tablet 0    Omeprazole (PRILOSEC) 20 mg Delayed Release Capsule Take 1 capsule by mouth two times daily before meals. 60 capsule 0    Ondansetron (ZOFRAN, AS HYDROCHLORIDE,) 8 mg Tablet Take 1 tablet by mouth every 8 hours if needed. 270 tablet 3    Triamcinolone (KENALOG) 0.1 % Cream Apply to the affected area 2 times daily. Use twice a day for two weeks on the chest lesion. 30 g 1    Venlafaxine ([name]) 75 mg Tablet Take 1 tablet by mouth 3 times daily. 90 tablet 1     No current facility-administered medications on  file prior to visit.      I did review patient's past medical and family/social history, no changes noted.   PMH:  Past medical history was reviewed from problem list.   Patient Active Problem List   Diagnosis    DM2 (diabetes mellitus, type 2)    Depression with anxiety    Stress at home    Leg cramps-right calf    Right ear pain    Fibromyalgia    HTN (hypertension)    GERD (gastroesophageal reflux disease)    Hyperlipidemia with target LDL less than 70    Vitamin D insufficiency    Abnormal LFTs    Renal cyst ( 6.4 cm cyst left kidney)    Cough    Type 2 diabetes mellitus without complication    Fatty liver    Macroalbuminuric diabetic nephropathy    History of actinic keratoses    Cataract    Ptosis of eyelid    Liver fibrosis    Adrenal nodule    Medication Therapy Auth    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    Mixed conductive and sensorineural hearing loss of both ears    Neoplasm of  uncertain behavior of skin     VITAL SIGNS:  Temp: --  Temp src: --  Pulse: 76 (05/25 1108)  BP: 131/84 (05/25 1108)  Resp: --  SpO2: --  Height: --  Weight: 82.4 kg (181 lb 10.5 oz) (05/25 1108)    PHYSICAL EXAM:  General Appearance: healthy, alert, no distress, pleasant affect, cooperative.  Eyes:  conjunctivae and corneas clear. PERRL, EOM's intact. sclerae normal.  Mouth: normal.  Neck:  Neck supple. No adenopathy, thyroid symmetric, normal size.  Heart:  normal rate and regular rhythm, no murmurs, clicks, or gallops.  Lungs: clear to auscultation.  Abdomen: BS normal.  Abdomen soft, non-tender.  No masses or organomegaly.  Extremities:  no cyanosis, clubbing, or edema.  Foot Exam: normal DP and PT pulses, no trophic changes or ulcerative lesions, normal sensory exam and normal monofilament exam.  Skin:  Skin color, texture, turgor normal. No rashes or lesions.  Neuro: Gait normal. No tremor appreciated. Sensation and strength grossly normal.  Mental Status: Appearance/Cooperation: in no apparent distress and well developed and well nourished  Eye Contact: normal  Speech: normal volume, rate, and pitch    LAB TESTS/STUDIES:   I personally reviewed the following laboratory studies.     03/17/2016 10:32 07/09/2016 11:57   SODIUM  138   POTASSIUM  3.6   CHLORIDE  103   CARBON DIOXIDE TOTAL  25   UREA NITROGEN, BLOOD (BUN)  15   CREATININE BLOOD  0.99   GLUCOSE  150 (H)   CALCIUM  9.0   PROTEIN  7.9   ALBUMIN  3.9   ALKALINE PHOSPHATASE (ALP)  116 (H)   ASPARTATE TRANSAMINASE (AST)  59 (H)   BILIRUBIN TOTAL  0.7   ALANINE TRANSFERASE (ALT)  59 (H)   E-GFR, AFRICAN AMERICAN  >60   E-GFR, NON-AFRICAN AMERICAN  59   CHOLESTEROL  221 (H)   TRIGLYCERIDE  210 (H)   LDL CHOLESTEROL CALCULATION  123   HDL CHOLESTEROL  56   NON-HDL CHOLESTEROL  165 (H)   TOTAL CHOLESTEROL:HDL RATIO  3.9   HGB A1C 6.1 (H) 8.0 (H)   HGB A1C,GLUCOSE EST AVG 128 183   VITAMIN B12 197 (L)    VITAMIN D, 25 HYDROXY 34.4       07/09/2016  11:57   WHITE  BLOOD CELL COUNT 8.0   HEMOGLOBIN 12.9   HEMATOCRIT 39.6   MCV 78.5 (L)   RDW 16.2 (H)   PLATELET COUNT 239      07/09/2016 12:23   CREATININE SPOT URINE 261.23   MICROALBUMIN URINE 173.8   MICROALBUMIN/CREATININE RATIO 665 (H)     01/08/2016:    Ref Range & Units 57moago    TOTAL VOLUME mL 1625   TIME OF COLLECTION hr 24   DOPAMINE,UR PER VOLUME ug/L 91   DOPAMINE,UR PER 24HR 77 - 324 ug/d 148   DOPAMINE,UR RATIO TO CRT 0 - 250 ug/g CRT 147   NOREPINEPHRINE,UR PER VOLUME ug/L 43   NOREPINEPHRINE,UR PER 24HR 16 - 71 ug/d 70   NOREPINEPHRINE,UR RATIO TO CRT 0 - 45 ug/g CRT 69 (H)   EPINEPHRINE,UR PER VOLUME ug/L 1   EPINEPHRINE,UR PER 24 HR 1 - 7 ug/d 2   EPINEPHRINE,UR RATIO TO CRT 0 - 20 ug/g CRT 2        Ref Range & Units 246mogo    TOTAL VOLUME mL 1625   TIME OF COLLECTION hr 24   CORTISOL,UR FREE PER VOLUME ug/L 8.46   CORTISOL,UR FREE PER 24HR <=45.0 ug/d 13.7   CORTISOL,UR FREE RATIO TO CRT ug/g CRT 13.65        Ref Range & Units 77m63moo    TOTAL VOLUME mL 1625   TIME OF COLLECTION hr 24   METANEPHRINE,UR-PER VOLUME ug/L 71   METANEPHRINE,UR-PER 24HR 39 - 143 ug/d 115   METANEPHRINE,UR-RATIO TO CRT 0 - 300 ug/g CRT 115   NORMETANEPHRINE,UR-PER VOLUME ug/L 450   NORMETANEPHRINE,UR-PER 24HR 109 - 393 ug/d 731 (H)   NORMETANEPHRIN,UR-RATIO TO CRT 0 - 400 ug/g CRT 726 (H)        Ref Range & Units 77mo26mo    Urine Volume mL 1625   START DATE   01/07/2016   START TIME   0838   STOP DATE   01/08/2016   STOP TIME   0838   COLLECTION INTERVAL, URINE H 24.00   Creatinine Conc mg/dL 68.23   Creatinine 24 Hr 800 - 2000 mg/24Hr 1109      CT Abdomen and Pelvis (12/28/2015):  FINDINGS:  LOWER CHEST  Unremarkable.    ABDOMEN AND PELVIS  Liver: Unremarkable appearance of the liver with inability to assess for  hepatic steatosis on CT. A few subcentimeter hypoattenuating lesions in  right hepatic dome are too small to characterize.  Gallbladder: Unremarkable.  Pancreas: Unremarkable.  Spleen: Unremarkable.  Adrenals: Right adrenal  gland is unremarkable. Left adrenal gland nodule  measuring up to 1.4 cm, statistically likely adenoma.  Kidneys: 1.7 cm partially exophytic cyst within midpole of left kidney.  Additional hypodensities are too small to characterize. No hydronephrosis.  Bladder: Decompressed and poorly evaluated.  Reproductive Organs: Status post hysterectomy. No adnexal masses.  Bowel: Colonic diverticulosis without adjacent inflammatory change. No  abnormal bowel wall thickening or dilatation.  Nodes: Mildly prominent, nonspecific porta hepatis lymph nodes measuring up  to 12 mm.  Vessels: Mild aortoiliac atherosclerosis.  Other: No free fluid.    BONES AND SUBCUTANEOUS SOFT TISSUES  Soft tissue: Unremarkable.  Bones: Mild degenerative changes. No acute bony abnormality.    IMPRESSION:  1. No acute intra-abdominal process.  2. No definite CT evidence of liver disease with inability to assess  for hepatic steatosis on CT. Given discrepancy with previous ultrasound  study of  10/31/2015 recommend dedicated MRI elastography for further  evaluation of fibrosis.  3. A 1.4 cm left adrenal nodule is indeterminate but statistically  highly likely benign if the patient has no known malignancy. Consider  follow up with adrenal protocol CT in 12 months. Hormonal evaluation is  recommended. Consider endocrinology referral     DXA (10/26/2015):  FINDINGS:  Anatomic Location: AP LUMBAR SPINE  BMD (gm/cm2): 1.032   SD above (+)/below, (-) T score: -0.1   SD above (+)/below, (-) Z score: 1.8    Anatomic Location: LEFT FEMORAL NECK   BMD (gm/cm2): 0.585   SD above (+)/below, (-) T score: -2.4   SD above (+)/below, (-) Z score: -0.7    Anatomic Location: LEFT TOTAL HIP  BMD (gm/cm2): 1.100   SD above (+)/below, (-) T score: 1.3   SD above (+)/below, (-) Z score: 2.7     Impression: This is a 71yrold female with Diabetes Mellitus Type II who presents to Endocrinology for follow up. Overall, her glycemic control is above goal as evidenced by her most  recent A1c of 8%.  Review of her blood glucose meter download today demonstrates that she has elevated blood glucose most of the time when she is checking. We discussed adding an additional medication for her diabetes regimen and one of Paula Daugherty's goals is weight loss. Given her GI issues, I would be hesitant to add a GLP-1 receptor agonist as this can increase nausea/vomiting and we discussed SGLT-2 inhibitors today and the risks/benefits of this medication, including the common side effect of genital mycotic infections. After discussion, Ms. MBefortis willing to try this medication and she will start Jardiance 10 mg daily.     She also has a history of an adrenal nodule. Hormonal evaluation was normal in 01/2016 and she will be due for repeat hormonal evaluation in 01/2017 and repeat imaging in 12/2016. Will decrease glipizide to 10 mg daily with this change.      DIABETES HISTORY  (-) h/o retinopathy.      (-) h/o microalbuminuria/nephropathy.  (-) h/o neuropathy.  (-) h/o Autonomic dysfunction  (-) h/o Gastroparesis  (-) h/o CAD  (-) h/o PVD     Last eye exam: 05/2016, f/u 1year  Last urine microalbumin: 07/2016, 665  Pneumovax: 12/2014  Influenza vaccine: 02/2016  (+) ASA  (-) Statin  (+) ACE/ARB          Recommendations:  (E11.29) Type 2 diabetes mellitus with other diabetic kidney complication  (primary encounter diagnosis)  - Continue current dose of metformin  - Start Jardiance 10 mg daily - patient will notify me if she has any difficulty obtaining or tolerating this medication  - Decrease glipizide to 10 mg daily  - Encouraged patient to continue blood glucose monitoring to at least 1x/day, goal to check 2x/day  - Last LDL elevated after patient d/c'ed statin but has restarted, continue statin, repeat fasting lipid panel now  - Blood pressure at goal, continue amlodipine, cozaar    - Last microalbumin:creatinine ratio elevated, continue cozaar, repeat now  - Up to date on screening eye exam, next due  05/2017  - Repeat A1c prior to follow up appointment     2. Adrenal nodule  - Biochemical evaluation normal, repeat due in 01/2017 (~1 year from previous)  - Repeat imaging in ~12/2016 (~1 year from previous)    Approximately 40 minutes were spent with patient, greater than 50% of which was spent counseling the patient on  diabetes and on coordination of care.     Follow up in 3 months       If you have any questions, please do not hesitate to contact me at [phone].  Thank you for allowing me to participate in the care of this patient.    EDUCATION:  I educated/instructed the patient or caregiver regarding all aspects of the above stated plan of care.  The patient or caregiver indicated understanding.      [place] interpreter was not used.    Report electronically signed by:  Sunday Spillers, M.D.  Clinical Assistant Professor  Department of Endocrinology  Pager # [phone]

## 2016-09-26 NOTE — Patient Instructions (Addendum)
1. Please continue current dose of metformin    2. Decrease glipizide to 1 pill per day    3. Start Jardiance 10 mg daily. Please notify Dr. Rosary Lively if you have any difficulty obtaining or tolerating this medication.    4. A1c and fasting cholesterol tests have been ordered    Please call [phone] to schedule repeat CT scan in fall 2018.     24 hour urine tests    To do a 24-hour urine collection:  1. At 7:00 AM, urinate into the toilet. Do not save this urine.  2. The next time you need to urinate, start to collect all the urine in the jug provided. Continue collecting the urine every time you urinate, in the jug, for the next 24 hours until the same time on the following day.  3. Between collections, put the jug in a plastic bag, then a paper bag, and keep in the refrigerator or a styrofoam ice chest filled with ice.  4. Urinate at 7:00 AM the next day to finish the collection, and put that urine in the jug.  5. Return the urine to the lab the same day that you finish the urine collection.  6. When you return the jug of urine, go in for the blood work.

## 2016-09-26 NOTE — Nursing Note (Signed)
Vital signs taken, verified allergies, pharmacy and screened for pain.  Zamiya Dillard L Dezi Brauner, MAII

## 2016-09-30 ENCOUNTER — Other Ambulatory Visit: Payer: Self-pay | Admitting: GASTROENTEROLOGY

## 2016-10-07 ENCOUNTER — Encounter: Payer: Self-pay | Admitting: Family Medicine

## 2016-10-07 DIAGNOSIS — F418 Other specified anxiety disorders: Secondary | ICD-10-CM

## 2016-10-07 NOTE — Telephone Encounter (Signed)
From: [name]  To: [name], MD  Sent: 10/06/2016 9:45 PM PDT  Subject: Referral Question    Hi Dr, at one point your gave me a list of counselors. I cannot find it. Is it possible to email it to me? I promised Dr Donnamarie Poag I would seek counseling. Thank you.

## 2016-10-09 ENCOUNTER — Other Ambulatory Visit: Payer: Self-pay

## 2016-10-09 NOTE — Progress Notes (Signed)
Ambulatory Case Management   Health Management and Education      Received referral for Ambulatory Case Management and reviewed the pertinent portion of patients chart. Sent mychart message to patient as first outreach attempt to offer requested resources. Awaiting response from the patient. Will follow up and assist as needed.        Electronically signed by   Keturah Barre, [place], CCM  [phone]   Social Work, Case Manager  Health Management and Education  Dept of Ambulatory Case Management

## 2016-10-09 NOTE — Progress Notes (Signed)
Health Management & Education    Request received for Ambulatory Case Management from MD.  Pertinent portions of the chart reviewed.  H/o DM2, depression w/anxiety, fibromyalgia, HTN, HLD.  Pt was previously open to ACM, requesting list of therapists to be resent to her.  May consider co-management with RN for uncontrolled diabetes and elevated cholesterol.  Referred to Keturah Barre, [place] for assessment and assistance finding a therapist.      Electronically Signed By:    Beola Cord  RN Case Manager  [facility]  [phone]

## 2016-10-09 NOTE — Addendum Note (Signed)
Addended by: [name] on: 10/09/2016 11:55 AM     Modules accepted: Orders

## 2016-10-10 ENCOUNTER — Ambulatory Visit: Payer: Medicare Other | Admitting: GASTROENTEROLOGY

## 2016-10-10 ENCOUNTER — Telehealth: Payer: Self-pay | Admitting: GASTROENTEROLOGY

## 2016-10-10 ENCOUNTER — Encounter: Payer: Self-pay | Admitting: GASTROENTEROLOGY

## 2016-10-10 VITALS — BP 137/82 | HR 82 | Temp 98.7°F | Resp 16 | Ht 62.0 in | Wt 180.3 lb

## 2016-10-10 DIAGNOSIS — K7581 Nonalcoholic steatohepatitis (NASH): Secondary | ICD-10-CM

## 2016-10-10 DIAGNOSIS — E669 Obesity, unspecified: Secondary | ICD-10-CM

## 2016-10-10 DIAGNOSIS — Z6832 Body mass index (BMI) 32.0-32.9, adult: Secondary | ICD-10-CM

## 2016-10-10 DIAGNOSIS — K3184 Gastroparesis: Secondary | ICD-10-CM

## 2016-10-10 NOTE — Progress Notes (Signed)
Hepatology Outpatient Progress Note    Patient Name: Paula Daugherty  MRN: [id number]  Date of Service: 07/04/2016  Referring Provider: [name], MD  Attending Physician: Georgeanne Nim MD, MPH    REASON FOR CONSULTATION: NASH and gastroparesis    SUBJECTIVE:  Paula Daugherty is a 71yrold female with history of DM and obesity BMI 32.  She has fatty liver seen on [place]2017 which showed fat in her liver without splenomegaly and a liver fibrosis score of 12.9 (F3) suggestive of severe fibrosis.  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  She had chronic nausea and vomiting recently diagnosed with gastroparesis on GES.  She has had symptomatic relief from Reglan, but has since stopped it due to the risk of side effects.  Now she is managing with zofran as needed for nausea.    Weight has been stable.    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    Psychiatric illness        ALLERGIES:    Contrast Dye [Radiopaque Agent]    Hives  Hydrochlorothiazide    Other-Reaction in Comments    Comment:Patient reports  Januvia [Sitagliptin]    Other-Reaction in Comments    Comment:Patient reports  Lyrica [Pregabalin]    Other-Reaction in Comments    Comment:Patient reports  Omnipaque [Iohexol]    Other-Reaction in Comments    Comment:Patient reports  Prozac [Fluoxetine Hcl]    Other-Reaction in Comments    Comment:Patient reports  Sulfa (Sulfonamide Antibiotics)    Rash  Topiramate    Other-Reaction in Comments    Comment:Hairfall    MEDICATIONS:  Current Outpatient Prescriptions on File Prior to Visit   Medication Sig Dispense Refill    Amlodipine (NORVASC) 10 mg Tablet Take 1 tablet by mouth every day. 90 tablet 3    Atorvastatin (LIPITOR)  10 mg Tablet Take 1 tablet by mouth every day at bedtime. 90 tablet 3    Blood Sugar Diagnostic (ONETOUCH ULTRA TEST) Strips Use to test blood glucose 3x/day 300 strip 11    empagliflozin (JARDIANCE) 10 mg Tablet Take 1 tablet by mouth every day. 30 tablet 11    FLUTICASONE/SALMETEROL (ADVAIR DISKUS INHA) Take  by inhalation.      GlipiZIDE (GLUCOTROL XL) 10 mg SR Tablet Take 2 tablets by mouth every morning with a meal. (diabetes) 60 tablet 5    Losartan (COZAAR) 25 mg Tablet Take 1 tablet by mouth every day. 90 tablet 3    Metformin (GLUCOPHAGE) 1,000 mg tablet Take 1 tablet by mouth 2 times daily with meals. (diabetes) 180 tablet 3    Omeprazole (PRILOSEC) 20 mg Delayed Release Capsule Take 1 capsule by mouth two times daily before meals. 60 capsule 0    Ondansetron (ZOFRAN, AS HYDROCHLORIDE,) 8 mg Tablet Take 1 tablet by mouth every 8 hours if needed. 270 tablet 3    Triamcinolone (KENALOG) 0.1 % Cream Apply to the affected area 2 times daily. Use twice a day for two weeks on the chest lesion. 30 g 1    Venlafaxine ([name]) 75 mg Tablet Take 1 tablet by mouth 3 times daily. 90 tablet 1  No current facility-administered medications on file prior to visit.        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  Eyes: negative.  Ears, Nose, Mouth, Throat: negative.  CV: negative.  Resp: negative.  GI: vomiting, nausea.  GU: negative.  Musculoskeletal: negative.  Integumentary: negative.  Neuro: negative.  Psych: Mood pt's report, euthymic.  Endo: negative.  Heme/Lymphatic: negative.  Allergy/Immun: negative.     PHYSICAL EXAMINATION:  Temp: 37.1 C (98.7 F) (06/08 1313)  Temp src: Temporal (06/08 1313)  Pulse: 82 (06/08 1313)  BP: 137/82 (06/08 1313)  Resp: 16 (06/08 1313)  SpO2: 97 % (06/08 1313)  Height: 157.5 cm (5' 2" ) (06/08 1313)  Weight: 81.8 kg (180 lb 5.4 oz) (06/08 1313)    GENERAL: well appearing, well nourished.  ABDOMEN: Soft, nontender, and nondistended.  Normal bowel sounds.  No hepatosplenomegaly was noted.  EXTREMITIES: Without any cyanosis, clubbing, or edema.  NEUROLOGIC: Alert and oriented x4, no asterixis  PSYCHIATRIC: Normal mood and affect.  SKIN: No rashes seen.    LABORATORY EXAMINATIONS:   Lab Results   Lab Name Value Date/Time    NA 138 07/09/2016 11:57 AM    NA 142 08/30/2015 08:35 AM    K 3.6 07/09/2016 11:57 AM    K 4.0 08/30/2015 08:35 AM    CL 103 07/09/2016 11:57 AM    CL 104 08/30/2015 08:35 AM    CO2 25 07/09/2016 11:57 AM    CO2 22 (L) 08/30/2015 08:35 AM    BUN 15 07/09/2016 11:57 AM    BUN 19 08/30/2015 08:35 AM    CR 0.99 07/09/2016 11:57 AM    CR 0.88 08/30/2015 08:35 AM    GLU 150 (H) 07/09/2016 11:57 AM    GLU 139 (H) 08/30/2015 08:35 AM     Lab Results   Lab Name Value Date/Time    WBC 8.0 07/09/2016 11:57 AM    WBC 8.9 08/30/2015 08:35 AM    HGB 12.9 07/09/2016 11:57 AM    HGB 14.4 08/30/2015 08:35 AM    HCT 39.6 07/09/2016 11:57 AM    HCT 44.6 08/30/2015 08:35 AM    PLT 239  07/09/2016 11:57 AM    PLT 217 08/30/2015 08:35 AM     Lab Results   Lab Name Value Date/Time    AST 59 (H) 07/09/2016 11:57 AM    AST 71 (H) 08/30/2015 08:35 AM    ALT 59  (H) 07/09/2016 11:57 AM    ALT 69 (H) 08/30/2015 08:35 AM    ALP 116 (H) 07/09/2016 11:57 AM    ALP 104 08/30/2015 08:35 AM    ALB 3.9 07/09/2016 11:57 AM    ALB 4.2 08/30/2015 08:35 AM    TP 7.9 07/09/2016 11:57 AM    TP 8.5 (H) 08/30/2015 08:35 AM    TBIL 0.7 07/09/2016 11:57 AM    TBIL 0.9 08/30/2015 08:35 AM     Lab Results   Component Value Date    INR 1.00 01/08/2016     Lab Results   Component Value Date    VITD25 34.4 03/17/2016       VIRAL SEROLOGIES:  No results found for this or any previous visit.  Lab Results   Component Value Date    HBSAG Nonreactive 12/08/2013     Lab Results   Component Value Date    HEPBABQT 153.46 12/08/2013     No results found for: ANTIHBCT  No results found for this or any previous visit.  No results found for this or any previous visit.  No results found for this or any previous visit.    Lab Results   Component Value Date    HEPCABSCR Nonreactive 12/08/2013     No results found for this or any previous visit.  No results found for this or any previous visit.  No results found for: HIV1AND2SCR    AUTOIMMUNE:  No results found for: ANASCREEN  No results found for: IGG  No results found for this or any previous visit.  No results found for this or any previous visit.    OTHER:  Lab Results   Component Value Date    FRTN 15 02/29/2016     No results found for this or any previous visit.  No results found for this or any previous visit.    STUDIES:  Colonoscopy 2015  DIAGNOSIS                  A.  CECUM, POLYP (SNARE POLYPECTOMY):       -  Fragments of tubular adenoma (see Comment)       -  No high-grade dysplasia or malignancy        B.  RECTUM, ULCER (BIOPSY):       -  Colonic mucosa with focal erosion and reactive epithelial changes       -  No dysplasia or malignancy       -  Deeper levels examined     EGD 02/2016   A.  DUODENUM, POLYP (BIOPSY):       -  Well differentiated neuroendocrine tumor, Grade 1, transected,  see         comment       -  Maximum size: 7.5 mm (measured on slide)       -  Ki67 <2%       -  Synaptophysin positive        Comment: This case has been reviewed in intradepartmental consultation.        Assessment and Plan:  1. NASH: based on Fibroscan, she has F3 disease.  She will continue modify her diet and meets  regularly with a dietician to discuss.  2. Gastroparesis confirmed by gastric emptying study 06/2016: she had symptomatic relief using Reglan, but we cannot continue this long term because of the risk for tardive dyskinesia.  Overall she is managing fine with zofran as needed for nausea.  We did discuss gastric pacemaker, but she does not feel that her symptoms were bad enough to warrant this referral.  3. Carcinoid: no evidence of residual disease, follow up with oncology       No orders of the defined types were placed in this encounter.    Return in about 3 months (around 01/10/2017).    Georgeanne Nim MD, MPH  Assistant Professor of Internal Medicine  Division of Gastroenterology and Hepatology  [address], [id number], [place], [place] [address]  Tel: [phone]  Fax: [phone]  [email or web address]

## 2016-10-10 NOTE — Nursing Note (Signed)
Identified patient with two identifiers, vitals taken, screened for pain, allergies identified, and pharmacy verified.    Paula Maggi MA II

## 2016-10-10 NOTE — Telephone Encounter (Signed)
Patient has a f/u apt on 7/28 with Dr Virgel Bouquet and needs an updated referral

## 2016-10-14 NOTE — Telephone Encounter (Signed)
Referral attached to appointment

## 2016-10-15 ENCOUNTER — Encounter: Payer: Self-pay | Admitting: "Endocrinology

## 2016-10-15 ENCOUNTER — Ambulatory Visit: Payer: Medicare Other | Attending: Neurology | Admitting: Neurology

## 2016-10-15 VITALS — BP 135/76 | HR 90 | Temp 97.7°F | Resp 16 | Ht 62.0 in | Wt 179.7 lb

## 2016-10-15 DIAGNOSIS — Z6379 Other stressful life events affecting family and household: Secondary | ICD-10-CM | POA: Insufficient documentation

## 2016-10-15 DIAGNOSIS — F515 Nightmare disorder: Secondary | ICD-10-CM

## 2016-10-15 DIAGNOSIS — E1121 Type 2 diabetes mellitus with diabetic nephropathy: Secondary | ICD-10-CM

## 2016-10-15 DIAGNOSIS — G4710 Hypersomnia, unspecified: Secondary | ICD-10-CM | POA: Insufficient documentation

## 2016-10-15 DIAGNOSIS — F418 Other specified anxiety disorders: Secondary | ICD-10-CM

## 2016-10-15 DIAGNOSIS — F3289 Other specified depressive episodes: Secondary | ICD-10-CM | POA: Insufficient documentation

## 2016-10-15 MED ORDER — EMPAGLIFLOZIN 25 MG TABLET
25.0000 mg | ORAL_TABLET | Freq: Every day | ORAL | 3 refills | Status: DC
Start: 2016-10-15 — End: 2016-10-16

## 2016-10-15 NOTE — Nursing Note (Signed)
Vital signs taken, allergies verified, screened for pain. Preferred pharmacy verified.  Medication refills needed no.  Kaylib Furness, MA

## 2016-10-15 NOTE — Progress Notes (Signed)
[facility] - Follow-Up Visit    DATE:        10/15/2016  Paula Daugherty  [address]  [place] [address]  06-17-1945  [phone] (home)   [phone]      PCP:   [name], MD  [place] [address]      Dear  Dr. [name], MD,       Thank you for allowing me to see your patient, Paula Daugherty, who is a  39yrfemale with a past medical history of carcinoid tumor, diabetes mellitus type 2, hypertension, GERD, fibromyalgia, and depression who was seen today in follow-up for sleepiness, fatigue, and nightmares.     In brief, the patient was last seen in the Sleep Clinic on 07/14/16 with vivid disturbing dreams. The patient's diagnostic sleep study 02/09/14 showed an AHI of 4.3/hr and min O2 sat of 88%. The sleep efficiency was 90%. Sleep latency was normal at 2 minutes. The REM latency was delayed at 263 minutes. The periodic limb movement index wa snormal at 4.8/hr. The patient was started on venlafaxine 150 mg in the AM and 75 mg in the PM which has been beneficial. Also recommended Image Rehearsal Therapy and following up with a mental health professional. Since that time the patient's symptoms have improved in terms of the dreams. She attributes this to the venlafaxine. She initially was engaged with IRT but no longer.     She will take naps in the afternoon up to several hours. This is not particularly restorative. This does not affect her ability to sleep at night.    She is now seeing a mental health professional and is caregiver support group. She continues to feel stress relating to not being able to see her grandson.     Today, the Epworth Sleepiness Scale was :    15 /24, >10 is suggestive of sleepiness. The patient does not have drowsiness with driving.    Sleep Review of Systems:  Sleepiness, non-restorative sleep, fatigue; no insomnia, breath holding, gasping, choking, snoring, breathing interruptions.    No difficulty falling asleep,  difficulty staying asleep, waking too early.    Excessive daytime sleepiness; no cataplexy, hypnagogic/hypnopompic hallucinations, sleep paralysis.     No waking up confused, sleep walking, night terrors, sleep eating, dream reenactment; occasional nightmares; no bed wetting, sleep talking.    No urge to move the legs, uncomfortable sensation in the legs; no frequent leg movements in sleep, leg cramps, teeth grinding.    General Review of Systems:  Constitutional: fatigue.  Eyes: negative.  Ears, Nose, Mouth, Throat: negative.  CV: negative.  Resp: negative.  GI: negative.  GU: negative.  Musculoskeletal: negative.  Integumentary: negative.  Neuro: negative.  Psych: Mood pt's report, depressed mood.  Endo: negative.  Heme/Lymphatic: negative.  Allergy/Immun: negative.    Medications:  Current Outpatient Prescriptions on File Prior to Visit   Medication Sig Dispense Refill    Amlodipine (NORVASC) 10 mg Tablet Take 1 tablet by mouth every day. 90 tablet 3    Atorvastatin (LIPITOR) 10 mg Tablet Take 1 tablet by mouth every day at bedtime. 90 tablet 3    Blood Sugar Diagnostic (ONETOUCH ULTRA TEST) Strips Use to test blood glucose 3x/day 300 strip 11    empagliflozin (JARDIANCE) 10 mg Tablet Take 1 tablet by mouth every day. 30 tablet 11    FLUTICASONE/SALMETEROL (ADVAIR DISKUS INHA) Take  by inhalation.  GlipiZIDE (GLUCOTROL XL) 10 mg SR Tablet Take 2 tablets by mouth every morning with a meal. (diabetes) 60 tablet 5    Losartan (COZAAR) 25 mg Tablet Take 1 tablet by mouth every day. 90 tablet 3    Metformin (GLUCOPHAGE) 1,000 mg tablet Take 1 tablet by mouth 2 times daily with meals. (diabetes) 180 tablet 3    Omeprazole (PRILOSEC) 20 mg Delayed Release Capsule Take 1 capsule by mouth two times daily before meals. 60 capsule 0    Ondansetron (ZOFRAN, AS HYDROCHLORIDE,) 8 mg Tablet Take 1 tablet by mouth every 8 hours if needed. 270 tablet 3    Triamcinolone (KENALOG) 0.1 % Cream Apply to the affected  area 2 times daily. Use twice a day for two weeks on the chest lesion. 30 g 1    Venlafaxine ([name]) 75 mg Tablet Take 1 tablet by mouth 3 times daily. 90 tablet 1     No current facility-administered medications on file prior to visit.        Allergies:  Allergies   Allergen Reactions    Contrast Dye [Radiopaque Agent] Hives    Hydrochlorothiazide Other-Reaction in Comments     Patient reports    Januvia [Sitagliptin] Other-Reaction in Comments     Patient reports    Lyrica [Pregabalin] Other-Reaction in Comments     Patient reports    Omnipaque [Iohexol] Other-Reaction in Comments     Patient reports    Prozac [Fluoxetine Hcl] Other-Reaction in Comments     Patient reports    Sulfa (Sulfonamide Antibiotics) Rash    Topiramate Other-Reaction in Comments     Hairfall       Past Medical History:  Patient Active Problem List   Diagnosis    DM2 (diabetes mellitus, type 2)    Depression with anxiety    Stress at home    Leg cramps-right calf    Right ear pain    Fibromyalgia    HTN (hypertension)    GERD (gastroesophageal reflux disease)    Hyperlipidemia with target LDL less than 70    Vitamin D insufficiency    Abnormal LFTs    Renal cyst ( 6.4 cm cyst left kidney)    Cough    Type 2 diabetes mellitus without complication    Fatty liver    Macroalbuminuric diabetic nephropathy    History of actinic keratoses    Cataract    Ptosis of eyelid    Liver fibrosis    Adrenal nodule    Medication Therapy Auth    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    Mixed conductive and sensorineural hearing loss of both ears    Neoplasm of uncertain behavior of skin       Examination:    Vitals:  BP 135/76  Pulse 90  Temp 36.5 C (97.7 F) (Tympanic)  Resp 16  Ht 1.575 m (5' 2" )  Wt 81.5 kg (179 lb 10.8 oz)  LMP 05/06/1983  BMI 32.86 kg/m2    Today Body Mass Index:   Body mass index is 32.86 kg/(m^2).  Marland Kitchen  General: No acute distress. Obese.   HEENT: Mallampati Classification: 4. Class 1  dental occlusion.  Cardiovascular: Regular rate and rhythm. No murmurs, clicks, or gallops.   Respiratory: Lungs clear to auscultation. No rales, rubs, or rhonchi.  Abdominal: soft, non-tender, non-distended, bowel sounds present.  Extremities: Palpable peripheral pulses x 4 extremities. No clubbing, cyanosis, or edema.  Skin: No rash.    Neurological Exam:  Mental status: Awake, alert, and oriented to person, place, and time. Language is fluent with intact comprehension, naming, and repetition.  Gait: normal  narrow stance.    Diagnostic Studies:    Lab Results   Lab Name Value Date/Time    TSH 2.26 12/11/2015 08:49 AM    TSH 1.22 08/30/2015 08:35 AM    FT4 0.79 12/11/2015 08:49 AM        Lab Results   Lab Name Value Date/Time    WBC 8.0 07/09/2016 11:57 AM    WBC 8.9 08/30/2015 08:35 AM    HGB 12.9 07/09/2016 11:57 AM    HGB 14.4 08/30/2015 08:35 AM    HCT 39.6 07/09/2016 11:57 AM    HCT 44.6 08/30/2015 08:35 AM    PLT 239 07/09/2016 11:57 AM    PLT 217 08/30/2015 08:35 AM       Lab Results   Lab Name Value Date/Time    CO2 25 07/09/2016 11:57 AM    CO2 22 (L) 08/30/2015 08:35 AM       Lab Results   Lab Name Value Date/Time    HGBA1C 8.0 (H) 07/09/2016 11:57 AM    HGBA1C 6.6 (H) 08/30/2015 08:35 AM       Lab Results   Lab Name Value Date/Time    FRTN 15 02/29/2016 10:31 AM       Lab Results   Lab Name Value Date/Time    B12 197 (L) 03/17/2016 10:32 AM       Impression:    Primary Sleep Disorder: Hypersomnolence, with Epworth 15/24, somewhat improved on venlafaxine, likely within the context of depression. Nightmares, which have improved with venlafaxine and changing the timing of the medication, within the context of the patient's mood d/o.  Circadian misalignment: None.  Medical Factors: None.  Pharmacologic Factors: None.  Psychiatric: Depression, life stressors still present, seeing mental health and a support group.   Psychosocial Factors: None.     Plan:    - Continue venlafaxine 150 mg AM, 75 mg PM.  -  Follow-up with mental health regarding your depression as this is likely the cause of your sleepiness, fatigue, and nightmares.   - Avoid naps, but if needed, < 1 hour and earlier in the day.   - Timed outdoor physical activity on waking and in the mid-afternoon.   - I have asked this patient to make a follow up appointment if needed.     The patient asked questions and the patient clearly understood.The patient was instructed and educated on all aspects of the plan of care. The patient acknowledged the plan of care. Symptoms that would merit an earlier follow up appointment were reviewed with the patient as well.    Thank you for allowing me to participate in the care of this patient.    Sincerely,    Electronically Signed 10/15/2016, 10:38 AM    [name], MD    Counselling and coordinating care took up more than 16 minutes of this 30 minute face-to-face consultation. The topics included: sleep hygiene, drowsy driving and the importance of sleep and the importance of compliance with therapy.

## 2016-10-15 NOTE — Patient Instructions (Signed)
-   Continue venlafaxine 150 mg in AM, 75 mg in PM.   - Outdoor light exposure and physical exercise on waking and in the early afternoon.  - Avoid naps if possible, but if needed, keep < 1 hour and prior to 3 pm to avoid insomnia at night.   - Follow-up with mental health and your support group as you have been doing.     If disturbing dreams continue recommend seeing the below provider:  Fee (per session): $250 (1:1)  $110 (group): both typically reimbursed 60-80% depending on insurance.   Access: [facility] for any patient in [place] and [place] ([place] within 6-8 months)   Contact: [email or web address]  [phone]   Schedule online: [email or web address]   Website: [email or web address]

## 2016-10-16 MED ORDER — EMPAGLIFLOZIN 25 MG TABLET
25.0000 mg | ORAL_TABLET | Freq: Every day | ORAL | 3 refills | Status: DC
Start: 2016-10-16 — End: 2017-03-04

## 2016-10-16 NOTE — Addendum Note (Signed)
Addended by: Sunday Spillers on: 10/16/2016 09:14 AM     Modules accepted: Orders

## 2016-10-17 ENCOUNTER — Other Ambulatory Visit: Payer: Self-pay | Admitting: Neurology

## 2016-10-17 DIAGNOSIS — F418 Other specified anxiety disorders: Secondary | ICD-10-CM

## 2016-10-18 NOTE — Telephone Encounter (Signed)
This is a  REFILL AUTHORIZATION for Venlafaxine 62m tablet, take 2 tabs PO QAM and 1 tab QPM.  Pt's name and birthdate verified. Pt was last seen by Dr. CDonnamarie Poagon 10-15-16. Next appointment is not yet scheduled for f/u PRN. Last office visit and medication list reviewed. Prescription filled for #90, with  11 refills.  Prescription sent to [facility].  Medication refilled per Prescription Refill by Clinic RN Standardized Procedure Policy ILF-81      [facility] - please schedule for annual follow up or enlist in cancellation list per hospital refill policy . Please reinforce need to keep appointment.          SGay FillerDoan,RN/Neurology

## 2016-11-04 ENCOUNTER — Other Ambulatory Visit: Payer: Self-pay | Admitting: "Endocrinology

## 2016-11-04 DIAGNOSIS — E279 Disorder of adrenal gland, unspecified: Principal | ICD-10-CM

## 2016-11-04 DIAGNOSIS — E278 Other specified disorders of adrenal gland: Secondary | ICD-10-CM

## 2016-11-06 ENCOUNTER — Encounter: Payer: Self-pay | Admitting: Family Medicine

## 2016-11-07 ENCOUNTER — Other Ambulatory Visit: Payer: Self-pay

## 2016-11-07 ENCOUNTER — Other Ambulatory Visit: Payer: Self-pay | Admitting: Family Medicine

## 2016-11-07 MED ORDER — OMEPRAZOLE 20 MG CAPSULE,DELAYED RELEASE
1.0000 | DELAYED_RELEASE_CAPSULE | Freq: Two times a day (BID) | ORAL | 5 refills | Status: DC
Start: 2016-11-07 — End: 2017-01-28

## 2016-11-07 NOTE — Telephone Encounter (Signed)
Pt last seen by PCP 08/19/2016

## 2016-11-07 NOTE — Progress Notes (Signed)
Ambulatory Case Management   Health Management and Education      Reviewed the pertinent portion of patients chart. Patient has been provided with requested resources. Sent mychart message to patient to follow up and see if she needs further assistance. Awaiting response or return call        Electronically signed by   Keturah Barre, [place], CCM  [phone]   Social Work, Case Manager  Health Management and Education  Dept of Ambulatory Case Management

## 2016-11-11 ENCOUNTER — Ambulatory Visit: Payer: Medicare Other | Attending: Family Medicine

## 2016-11-11 DIAGNOSIS — E1165 Type 2 diabetes mellitus with hyperglycemia: Secondary | ICD-10-CM | POA: Insufficient documentation

## 2016-11-11 DIAGNOSIS — K922 Gastrointestinal hemorrhage, unspecified: Secondary | ICD-10-CM | POA: Insufficient documentation

## 2016-11-11 DIAGNOSIS — E119 Type 2 diabetes mellitus without complications: Secondary | ICD-10-CM | POA: Insufficient documentation

## 2016-11-11 DIAGNOSIS — I10 Essential (primary) hypertension: Secondary | ICD-10-CM | POA: Insufficient documentation

## 2016-11-11 LAB — CBC WITH DIFFERENTIAL
Basophils % Auto: 0.7 %
Basophils Abs Auto: 0.1 10*3/uL (ref 0.0–0.2)
Eosinophils % Auto: 2.4 %
Eosinophils Abs Auto: 0.2 10*3/uL (ref 0.0–0.5)
Hematocrit: 40.5 % (ref 36.0–46.0)
Hemoglobin: 13.1 g/dL (ref 12.0–16.0)
Lymphocytes % Auto: 22.6 %
Lymphocytes Abs Auto: 2 10*3/uL (ref 1.0–4.8)
MCH: 26.1 pg — ABNORMAL LOW (ref 27.0–33.0)
MCHC: 32.4 % (ref 32.0–36.0)
MCV: 80.5 UM3 (ref 80.0–100.0)
MPV: 8.3 UM3 (ref 6.8–10.0)
Monocytes % Auto: 6.5 %
Monocytes Abs Auto: 0.6 10*3/uL (ref 0.1–0.8)
Neutrophils % Auto: 67.8 %
Neutrophils Abs Auto: 5.9 10*3/uL (ref 1.8–7.7)
Platelet Count: 271 10*3/uL (ref 130–400)
RDW: 15.2 % — ABNORMAL HIGH (ref 0.0–14.7)
Red Blood Cell Count: 5.04 10*6/uL (ref 4.00–5.20)
White Blood Cell Count: 8.7 10*3/uL (ref 4.5–11.0)

## 2016-11-11 LAB — BASIC METABOLIC PANEL
Calcium: 9.4 mg/dL (ref 8.6–10.5)
Carbon Dioxide Total: 26 mmol/L (ref 24–32)
Chloride: 101 mmol/L (ref 95–110)
Creatinine Serum: 1.05 mg/dL (ref 0.44–1.27)
E-GFR Creatinine (Male): 55 mL/min/{1.73_m2}
E-GFR, African American (Male): >60 mL/min/{1.73_m2}
Glucose: 156 mg/dL — ABNORMAL HIGH (ref 70–99)
Potassium: 3.8 mmol/L (ref 3.3–5.0)
Sodium: 138 mmol/L (ref 135–145)
Urea Nitrogen, Blood (BUN): 12 mg/dL (ref 8–22)

## 2016-11-11 LAB — LIPID PANEL
Cholesterol: 179 mg/dL (ref 0–200)
HDL Cholesterol: 54 mg/dL (ref >=35)
LDL Cholesterol Calculation: 84 mg/dL (ref <130)
Non-HDL Cholesterol: 125 mg/dL (ref <=150)
Total Cholesterol: HDL Ratio: 3.3 (ref <4.0)
Triglyceride: 206 mg/dL — ABNORMAL HIGH (ref 35–160)

## 2016-11-11 LAB — HEMOGLOBIN A1C
Hgb A1C,Glucose Est Avg: 177 mg/dL
Hgb A1C: 7.8 % — ABNORMAL HIGH (ref 3.9–5.6)

## 2016-11-12 ENCOUNTER — Other Ambulatory Visit: Payer: Self-pay

## 2016-11-12 ENCOUNTER — Encounter: Payer: Self-pay | Admitting: Family Medicine

## 2016-11-12 LAB — ALDOSTERONE,BLOOD SENDOUT: Aldosterone,Blood: 16.6 ng/dL

## 2016-11-12 NOTE — Progress Notes (Addendum)
Ambulatory Case Management   Health Management and Education      Received response from patient by mychart that she had to travel to visit her mom who had heart issues, and she was unable to find a therapist. This patient has been referred to [facility] and between 2016 and 2018 there have been more than 20 outreach efforts made to assist her; however, the patient reported similar barriers to accessing therapy (her mom is elderly and lives out of state). Although this is a challenging situation, the patient also appears to be in the contemplative stage of change. Patient may need more time before she is ready to access resources. Replied to mychart to encourage patient to request resources when she is ready.          Electronically signed by   Keturah Barre, [place], CCM  [phone]   Social Work, Case Manager  Health Management and Education  Dept of Ambulatory Case Management

## 2016-11-13 ENCOUNTER — Encounter: Payer: Self-pay | Admitting: "Endocrinology

## 2016-11-13 LAB — RENIN ACTIVITY SENDOUT: Renin Activity: 1.1 ng/mL/hr

## 2016-11-18 ENCOUNTER — Encounter: Payer: Self-pay | Admitting: Family Medicine

## 2016-11-18 ENCOUNTER — Ambulatory Visit: Payer: Medicare Other

## 2016-11-18 ENCOUNTER — Ambulatory Visit: Payer: Medicare Other | Attending: "Endocrinology | Admitting: Family Medicine

## 2016-11-18 VITALS — BP 138/86 | HR 73 | Temp 98.1°F | Ht 62.99 in | Wt 179.7 lb

## 2016-11-18 DIAGNOSIS — Z1231 Encounter for screening mammogram for malignant neoplasm of breast: Secondary | ICD-10-CM | POA: Insufficient documentation

## 2016-11-18 DIAGNOSIS — K7581 Nonalcoholic steatohepatitis (NASH): Secondary | ICD-10-CM | POA: Insufficient documentation

## 2016-11-18 DIAGNOSIS — E279 Disorder of adrenal gland, unspecified: Secondary | ICD-10-CM | POA: Insufficient documentation

## 2016-11-18 DIAGNOSIS — I10 Essential (primary) hypertension: Secondary | ICD-10-CM | POA: Insufficient documentation

## 2016-11-18 DIAGNOSIS — Z86010 Personal history of colonic polyps: Secondary | ICD-10-CM | POA: Insufficient documentation

## 2016-11-18 DIAGNOSIS — R0789 Other chest pain: Secondary | ICD-10-CM | POA: Insufficient documentation

## 2016-11-18 DIAGNOSIS — R9431 Abnormal electrocardiogram [ECG] [EKG]: Secondary | ICD-10-CM | POA: Insufficient documentation

## 2016-11-18 DIAGNOSIS — E278 Other specified disorders of adrenal gland: Secondary | ICD-10-CM

## 2016-11-18 DIAGNOSIS — Z79899 Other long term (current) drug therapy: Secondary | ICD-10-CM | POA: Insufficient documentation

## 2016-11-18 DIAGNOSIS — Z1239 Encounter for other screening for malignant neoplasm of breast: Secondary | ICD-10-CM

## 2016-11-18 DIAGNOSIS — E119 Type 2 diabetes mellitus without complications: Secondary | ICD-10-CM

## 2016-11-18 DIAGNOSIS — E1165 Type 2 diabetes mellitus with hyperglycemia: Secondary | ICD-10-CM

## 2016-11-18 NOTE — Nursing Note (Signed)
EKG performed per doctors request    Marnesha Gagen MA

## 2016-11-18 NOTE — Patient Instructions (Addendum)
Please see about possibly getting Shinrix from outside pharmacy--complete 2nd booster in 2 mos.     You have been referred to a specialist. We are pleased to offer the services of [facility] specialists located at the [facility] or [facility] sites. Please allow 7 business days (3 if urgent) for the referral to be processed. After that time you may call: Cardiology:  [facility][id number], [phone]  Gastroenterology: [place], [address] [phone]       A mammogram has been ordered for you.  Please call [phone] to schedule an appointment in [place]. You will receive a letter or telephone call with your results.     Please see about possibly getting Shinrix from outside pharmacy--repeat x1 in 44mo.     - - - - - - - - - - Information on GYNECOLOGIC CANCERS - - - - - - - - - -    What Women Need to Know    What are Women's Reproductive Cancers?    These are cancers of a woman's reproductive sex organs.  They are also called gynecologic cancers.  They include cancer of the:  -Vulva (lips around the opening of the vagina)  -Vagina (the birth canal)  -Cervix (The opening to the womb)  -Uterus (the womb)  -Fallopian Tubes (carry eggs to the womb)  -Ovaries (hold the eggs)    Who is at risk?  -All women have some risk.  Read this handout and learn more about:  your risk signs of cancer and ways of finding the cancer early    CHydro   More women die of this cancer than from any other reproductive cancer.    You have MORE risk for cancer of the ovaries if:  -You are over 564years of age  -S24in your family has had cancer of the ovaries or breast  -You have had breast cancer  -You have not had any children  -You have used hormones (for menopause) for over 10 years    You have LESS risk for cancer of the ovaries if:  -You have use the pill (birth control pills)  for more than 5 years  -You have breast fed your babies    WHAT YOU  SHOULD LOOK FOR:  Many women have no symptoms.  Sometimes it is hard to tell that symptoms are related to the ovaries.  Some women have:  -Pain, a full feeling, or a lump in the belly that doesn't go away  -Bleeding from the vagina that is not normal  -Stomach problems that do not go away, like gas, nausea or discomfort in the lower belly  - Or pain when having sex    CANCER OF THE UTERUS    Cancer of the uterus is the most common women's reproductive cancer.  This cancer is also called endometrial cancer and it usually starts in the lining of the womb.    You have MORE risk for uterine cancer if:  -You are over 535years of age  -You have too much body fat, have diabetes or high blood pressure  -You are taking some forms of hormones for menopause  -You have not had children  -Your menopause started after age 69 -You take tamoxifen, or certain other treatments for breast cancer    WHAT YOU SHOULD LOOK FOR:  Some women have:  -Bleeding or discharge from the  vaginal that is not normal  -A full feeling or cramps in the belly that does not go away  -Unexplained weight gain or weight loss    CANCER OF THE CERVIX    Cancer of the cervix can be prevented with regular PAP tests.  The PAP test can find cells that are not normal before they become cancer.    You have MORE risk for cervical cancer if:  -You do not get regular PAP smears and pelvic exams  -You or your sex partner have had many sex partners  -You had sex at an early age  -You have had genital warts (HPV infection)  -You smoke cigarettes    WHAT YOU SHOULD LOOK FOR:  -Bleeding, spotting or discharge from the vagina that is not normal  -Bleeding after having sex  -Pain when having sex    LESS COMMON CANCERS    Cancers of the vagina, vulva and fallopian tubes are not at all common.    You have a RISK for these cancers if:  -You are over 66 years of age  -You have had cancer of the cervix, or other reproductive cancers  -You have had genital warts (also called  HPV)    THE BEST WAY TO PROTECT YOURSELF IS TO FIND A CANCER EARLY!    What you can do to protect yourself:  -Get yearly pelvic exams  -Get PAP tests at appropriate intervals  -Talk to your health care provider about your personal risks  -Report any warning signs or changes you have noticed    Cancer of the ovaries is hard to find.  Talk to your provider about all of your symptoms, even if they do not seem to be related to your ovaries.  Your provider may offer special testing if you are determined to be at risk for ovarian cancer.    WHAT ELSE CAN YOU DO TO STAY HEALTHY:  -Eat 5 servings of fruits and vegetables every day  -Exercise 30 minutes every day  -Stay at a healthy weight  -Practice safer sex:  use a condom.

## 2016-11-18 NOTE — Progress Notes (Signed)
Paula Daugherty is a 35yrfemale who presents with a chief complaint of "here for follow-up".    1. Chest tightness: across chest at rest x 2 mos; no associated symptoms; abnormal EKG--no previous for comparison; Precautions given and patient amenable to continue workup with ETT and seeing cardiology   2. DM2: not at goal; seeing endocrine   3. NASH: due to recheck LFTs with next set of labs; weight loss and increase activity encouraged       ROS:   Constitutional: negative for fatigue, night sweats, malaise, anorexia, fever; stable weight.   Eyes: negative for blurry vision, double vision, visual loss, red eyes, photophobia, eye pain; yearly eye exams done  Ears, Nose, Mouth, Throat: negative for difficulty swallowing, persistent sore throat, hoarseness, dentures, post nasal drip, nosebleeds, rhinorrhea, loss of sense of smell, hearing loss, ear pain, tinnitus, discharge from ears, dental problem, change in taste, odynophagia.   CV: negative for palpitations, syncope, chest pain, paroxysmal nocturnal dyspnea, orthopnea, lower extremity edema, cyanosis, claudication.   Resp: negative for cough, sputum, hemoptysis, shortness of breath, excessive snoring, apnea spells while sleeping, pleuritic pain, wheezing, dyspnea on exertion.   GI: negative for vomiting, dysphagia, nausea, heartburn or reflux, early satiety, hematemesis, abdominal pain, excessive gas or bloating, change in caliber of stool, hemorrhoids, bleeding from rectum, melena, hematochezia, constipation, diarrhea, jaundice.   GU: negative for nocturia, dysuria, decreased force of stream, frequency, hesitancy, hematuria, retention, incontinence.   Musculoskeletal: negative for AM joint stiffness, joint swelling, joint pain, joint redness, back pain, muscle weakness, muscle pain, muscle cramping.   Integumentary: negative for moles that have changed, dark lesions, rash, itching, bruising, lumps or bumps, hair loss.   Neuro: negative for confusion, baseline  headaches, denies feel faint, seizures, vertigo, memory loss, paralysis/weakness, numbness or tingling, clumsiness, tremor, speech impairment.   Psych: stressors with daughter and mother.  Mood pt's report Negative for anhedonia, excessive guilt, depressed mood, crying spells, insomnia, early AM awakening, anxiety, impaired concentration, angry, suicidal ideation, abusive relationship.   Endo: negative for cold intolerance, heat intolerance, polyphagia, polydipsia, polyuria, hair loss, excessive hair growth.   Heme/Lymphatic: negative for recent anemia, bleeding disorder, abnormal bleeding, abnormal bruising, swollen nodes.   Allergy/Immun: negative for hay fever, itchy eyes, itchy nose, clear nasal secretions, rash.   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 90 tablet 3    Atorvastatin (LIPITOR) 10 mg Tablet Take 1 tablet by mouth every day at bedtime. 90 tablet 3    Blood Sugar Diagnostic (ONETOUCH ULTRA TEST) Strips Use to test blood glucose 3x/day 300 strip 11    empagliflozin (JARDIANCE) 25 mg Tablet Take 1 tablet by mouth every day. 90 tablet 3    FLUTICASONE/SALMETEROL (ADVAIR DISKUS INHA) Take  by inhalation.  GlipiZIDE (GLUCOTROL XL) 10 mg SR Tablet Take 2 tablets by mouth every morning with a meal. (diabetes) 60 tablet 5    Losartan (COZAAR) 25 mg Tablet Take 1 tablet by mouth every day. 90 tablet 3     Metformin (GLUCOPHAGE) 1,000 mg tablet Take 1 tablet by mouth 2 times daily with meals. (diabetes) 180 tablet 3    Omeprazole (PRILOSEC) 20 mg Delayed Release Capsule Take 1 capsule by mouth two times daily before meals. 60 capsule 5    Ondansetron (ZOFRAN, AS HYDROCHLORIDE,) 8 mg Tablet Take 1 tablet by mouth every 8 hours if needed. 270 tablet 3    Triamcinolone (KENALOG) 0.1 % Cream Apply to the affected area 2 times daily. Use twice a day for two weeks on the chest lesion. 30 g 1    Venlafaxine ([name]) 75 mg Tablet Take 2 tabs by mouth every morning and 1 tab every evening. 90 tablet 11     No current facility-administered medications on file prior to visit.      Allergies:   Allergies   Allergen Reactions    Contrast Dye [Radiopaque Agent] Hives    Hydrochlorothiazide Other-Reaction in Comments     Patient reports    Januvia [Sitagliptin] Other-Reaction in Comments     Patient reports    Lyrica [Pregabalin] Other-Reaction in Comments     Patient reports    Omnipaque [Iohexol] Other-Reaction in Comments     Patient reports    Prozac [Fluoxetine Hcl] Other-Reaction in Comments     Patient reports    Sulfa (Sulfonamide Antibiotics) Rash    Topiramate Other-Reaction in Comments     Hairfall       Social History     Social History    Marital status: MARRIED     Spouse name: N/A    Number of children: 1    Years of education: N/A     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       Social History Main Topics    Smoking status: Former Smoker     Packs/day: 0.10     Years: 20.00    Smokeless tobacco: Former Systems developer      Comment: quit 30 years ago    Alcohol use 0.0 oz/week     0 Standard drinks or equivalent per week      Comment: rare shares a beer or glass wine every 2-3 months     Drug use: No    Sexual activity: Yes     Partners: Male     Other Topics Concern    None     Social History Narrative     Family History   Problem Relation Age of Onset    Diabetes Father      Heart Mother      MI at 60s     Non-contributory Brother     Non-contributory Brother        PE:  BP 138/86  Pulse 73  Temp 36.7 C (98.1 F) (Temporal)  Ht 1.6 m (5' 2.99")  Wt 81.5 kg (179 lb 10.8 oz)  LMP 05/06/1983  BMI 31.84 kg/m2  General Appearance: healthy, alert, no distress, pleasant affect, cooperative.  Eyes:  conjunctivae and corneas clear. PERRL, EOM's intact. sclerae normal.  Ears:  normal TMs and canal and external inspection of ears show no abnormality.  Nose:  nasal mucosa normal.  Mouth: good dentition; no lesions.  Neck:  Neck supple. No  adenopathy, thyroid symmetric, normal size.  Heart:  normal rate and regular rhythm, no murmurs, clicks, or gallops.  Lungs: clear to auscultation.  Abdomen: BS normal.  Abdomen soft, non-tender.  No masses or organomegaly.  Extremities:  no cyanosis, clubbing, or edema.  Skin:  Skin color, texture, turgor normal. No rashes or lesions.  Neuro: grossly intact.  Musculoskeletal: normal tone  Breast:  normal in size and symmetry, normal contour with no evidence of flattening or dimpling, skin normal, nipples everted without rashes or discharge, palpation negative for masses or nodules, no LAD.  Pelvic:  external genitalia normal, Bartholin's glands, urethra, Skene's glands negative, adnexae negative, no pelvic or rectal masses--pelvic exam limited due to habitus.  Rectal: no mass.    Assessment and Plan:  (R07.89) Chest pressure  (primary encounter diagnosis)  Comment: new x 2 mos; abnormal EKG consistent with RBBB and nonspecific T waves was reviewed with patient   Plan: POC ELECTROCARDIOGRAM WITH RHYTHM STRIP,         EXERCISE TREADMILL TEST, CARDIOLOGY REFERRAL        Precautions given.  Call or return to clinic if symptoms worsen, fail to improve  or other symptoms develop.     (E11.9) Type 2 diabetes mellitus without complication, without long-term current use of insulin  Comment: A1c not at goal   Plan: HEMOGLOBIN A1C, COMPREHENSIVE METABOLIC PANEL,          URINALYSIS AND CULTURE IF IND        Increase in exercise encouraged along with follow-up with endocrine.     (K75.81) NASH (nonalcoholic steatohepatitis)  Comment: due to recheck LFTs   Plan: weight los encouraged     (I10) Essential hypertension  Comment: recheck per above   Plan: same as for above     (Z86.010) History of adenomatous polyp of colon  Comment: recheck suggested in 2018  Plan: [place]          (Z12.31) Screening for breast cancer  Comment: no discrete findings   Plan: BREAST MAMMOGRAM SCREENING         Routine follow-up with PCP yearly plus prn; skin monthly checks along with SBE encouraged      Total encounter time including history, physical examination, and coordination of care was approximately 45 minutes of which more than 50% was spent counseling regarding assessment/diagnosis and treatment plan. No guarantees were made regarding her medical care or treatment outcome. Barriers to Learning: none.  Patient verbalizes understanding of teaching and instructions.    Electronically signed by:    [name], MD  [place], [place] [employer]  Associate physician [facility], [place]   [phone]

## 2016-11-18 NOTE — Nursing Note (Signed)
Vital signs taken, allergies and pharmacy verified. Screened for pain. Med list given to patient for review.

## 2016-11-19 LAB — CREATININE 24 HOUR URINE
COLLECTION INTERVAL, URINE: 24 h
Creatinine 24 Hr: 887 mg/24Hr (ref 800–2000)
Creatinine Conc: 53.77 mg/dL
Urine Volume: 1650 mL

## 2016-11-19 LAB — POC ELECTROCARDIOGRAM WITH RHYTHM STRIP: QTC: 493

## 2016-11-23 LAB — METANEPHRINES FRACTIONATED,UR SENDOUT
CREATININE,UR PER 24HR: 924 mg/d (ref 500–1400)
CREATININE,UR PER VOLUME: 56 mg/dL
METANEPHRINE,UR-PER 24HR: 101 ug/d (ref 39–143)
METANEPHRINE,UR-PER VOLUME: 61 ug/L
METANEPHRINE,UR-RATIO TO CRT: 109 ug/g CRT (ref 0–300)
NORMETANEPHRIN,UR-RATIO TO CRT: 543 ug/g CRT — ABNORMAL HIGH (ref 0–400)
NORMETANEPHRINE,UR-PER 24HR: 502 ug/d — ABNORMAL HIGH (ref 109–393)
NORMETANEPHRINE,UR-PER VOLUME: 304 ug/L
TIME OF COLLECTION: 24 hr
TOTAL VOLUME: 1650 mL

## 2016-11-24 LAB — CORTISOL,URINE FREE SENDOUT
CORTISOL,UR FREE PER 24HR: 27.2 ug/d (ref <=45.0)
CORTISOL,UR FREE PER VOLUME: 16.5 ug/L
CORTISOL,UR FREE RATIO TO CRT: 29.46 ug/g CRT
CREATININE,UR PER 24HR: 924 mg/d (ref 500–1400)
CREATININE,UR PER VOLUME: 56 mg/dL
TIME OF COLLECTION: 24 hr
TOTAL VOLUME: 1650 mL

## 2016-11-25 ENCOUNTER — Encounter: Payer: Self-pay | Admitting: "Endocrinology

## 2016-11-25 ENCOUNTER — Other Ambulatory Visit: Payer: Self-pay | Admitting: Family Medicine

## 2016-11-25 LAB — CATECHOLAMINES,URINE FREE SENDOUT
CREATININE,UR PER 24HR: 924 mg/d (ref 500–1400)
CREATININE,UR PER VOLUME: 56 mg/dL
DOPAMINE,UR PER 24HR: 68 ug/d — ABNORMAL LOW (ref 77–324)
DOPAMINE,UR PER VOLUME: 41 ug/L
DOPAMINE,UR RATIO TO CRT: 73 ug/g CRT (ref 0–250)
EPINEPHRINE,UR PER 24 HR: <2 ug/d (ref 1–7)
EPINEPHRINE,UR PER VOLUME: <1 ug/L
EPINEPHRINE,UR RATIO TO CRT: <2 ug/g CRT (ref 0–20)
NOREPINEPHRINE,UR PER 24HR: 35 ug/d (ref 16–71)
NOREPINEPHRINE,UR PER VOLUME: 21 ug/L
NOREPINEPHRINE,UR RATIO TO CRT: 38 ug/g CRT (ref 0–45)
TIME OF COLLECTION: 24 hr
TOTAL VOLUME: 1650 mL

## 2016-11-25 MED ORDER — PEG3350 100 GRAM-SOD SULF 7.5 GRAM-NACL-KCL-ASCORBATE-C ORAL PWDR PACK
ORAL | 0 refills | Status: DC
Start: 2016-11-25 — End: 2017-05-01

## 2016-11-27 ENCOUNTER — Ambulatory Visit: Payer: Self-pay

## 2016-12-01 ENCOUNTER — Encounter: Payer: Self-pay | Admitting: Internal Medicine

## 2016-12-01 ENCOUNTER — Ambulatory Visit: Payer: Medicare Other | Attending: Internal Medicine | Admitting: Internal Medicine

## 2016-12-01 VITALS — BP 131/74 | HR 82 | Temp 98.3°F | Resp 16 | Ht 62.99 in | Wt 179.9 lb

## 2016-12-01 DIAGNOSIS — E119 Type 2 diabetes mellitus without complications: Secondary | ICD-10-CM | POA: Insufficient documentation

## 2016-12-01 DIAGNOSIS — C7A8 Other malignant neuroendocrine tumors: Secondary | ICD-10-CM | POA: Insufficient documentation

## 2016-12-01 DIAGNOSIS — Z9889 Other specified postprocedural states: Secondary | ICD-10-CM | POA: Insufficient documentation

## 2016-12-01 DIAGNOSIS — R1084 Generalized abdominal pain: Secondary | ICD-10-CM | POA: Insufficient documentation

## 2016-12-01 DIAGNOSIS — R110 Nausea: Secondary | ICD-10-CM | POA: Insufficient documentation

## 2016-12-01 DIAGNOSIS — K3184 Gastroparesis: Secondary | ICD-10-CM | POA: Insufficient documentation

## 2016-12-01 DIAGNOSIS — E669 Obesity, unspecified: Secondary | ICD-10-CM | POA: Insufficient documentation

## 2016-12-01 DIAGNOSIS — D3502 Benign neoplasm of left adrenal gland: Secondary | ICD-10-CM

## 2016-12-01 NOTE — Nursing Note (Signed)
Patient verified by two identifiers,Vital signs taken, allergies verified, screened for pain, pharmacy verified.    Reviewed with patient if any new or worsening emotional or social concerns.     No- No action needed.

## 2016-12-01 NOTE — Progress Notes (Signed)
HEMATOLOGY AND ONCOLOGY CLINIC FOLLOW-UP NOTE    REASONS FOR FOLLOW-UP: well differentiated neuroendocrine tumor of duodenum, localized     HEMATOLOGIC/ONCOLOGIC HISTORY  Patient with multiple medical problems including type II diabetes, obesity and NAFLDwho presented with chronic intermittent nausea with vomiting and abdominal pain for two years. Been seen by hepatologist Dr. Virgel Bouquet. Abdominal CT on 12/28/15 is unrevealing. She underwent EGD on 02/12/2016 which revealed a 1-1.5 cm polyp in the second part of the duodenum. Other findings include mild antral gastritis and no evidence of varices. The duodenal polyp was transected by hot snare. Pathology is consistent with grade 1 well differentiated neuroendocrine tumor with positive synaptophysin. Ki67 <2%. Her post-EGD course was complicated by post polypectomy bleeding which required an additional hemoclip at [facility].  Seth.Contes DOTATATE PET/CT scan on 02/28/16 reveals no evidence of residual or metastatic disease.  Normal gastrin level at 58 as of 02/29/16.    DISEASE STATUS: localized disease    SUMMARY OF HEMATOLOGIC/ONCOLOGIC THERAPY, RESPONSE & SIDE EFFECTS  1. S/p polypectomy 02/12/16   -patient was found to have a 1-1.5 cm polyp in the second part of the duodenum during EGD for the workup of nausea and vomiting and abdominal pain.  The polyp was transected by hot snare   -pathology revealed grade 1 well differentiated neuroendocrine tumor [id number] <2%   -NA35 DOTATATE PET/CT scan on 02/28/16 reveals no evidence of residual or metastatic disease   -normal gastrin level at 58 as of 02/29/16    2. Surveillance (present)   -repeat EGD 06/10/16 revealed the site of prior polypectomy.  A small bumpy raised area was noted at the base of the the hemoclip and marked with spot tattoo.  Antral gastritis was also noted   -pathology showed only duodenal mucosa with reactive changes with no neuroendocrine tumor present.  The stomach biopsy showed mild chronic  antral/oxyntic gastritis without activity and no H pylori was found    Others:  Gastroparesis    ECOG PERFORMANCE STATUS  [name]    INTERVAL SUBJECTIVE HISTORY  Since the last clinic visit, Ms Sen has been doing fair.  She continues to suffer from nausea but denies any further episodes of vomiting which is an improvement compared to prior visit. The nausea can occur anytime during the day or night and is not associated with eating.  She is currently taking zofran which somewhat helps the symptom.  On average she requires zofran 1-2 doses daily for three days a week.  Other symptoms include abdominal pain.  She describes the pain as intermittent generalized pain.  Again the pain is not associated with BM or eating.  The pain can come with nausea sometimes, and by itself at the other times.  The pain lasts for several hours before it resolves spontaneously.  Currently not taking any pain med.  Bowel pattern is alternating between constipation and diarrhea which is her norm.  She underwent a gastric emptying test at the end of Feb as she is clinically suspected to have gastroparesis.    - ROS otherwise negative except that noted above    PAST MEDICAL HISTORY  Patient Active Problem List   Diagnosis    DM2 (diabetes mellitus, type 2)    Depression with anxiety    Stress at home    Leg cramps-right calf    Right ear pain    Fibromyalgia    HTN (hypertension)    GERD (gastroesophageal reflux disease)  Hyperlipidemia with target LDL less than 70    Vitamin D insufficiency    Abnormal LFTs    Renal cyst ( 6.4 cm cyst left kidney)    Cough    Type 2 diabetes mellitus without complication    Fatty liver    Macroalbuminuric diabetic nephropathy    History of actinic keratoses    Cataract    Ptosis of eyelid    Liver fibrosis    Adrenal nodule    Medication Therapy Auth    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    Mixed conductive and sensorineural  hearing loss of both ears    Neoplasm of uncertain behavior of skin       Past Medical History:  No date: Asthma  No date: Cirrhosis  No date: Diabetes mellitus  No date: Hypertension  No date: Kidney disease  05/20/2016: Neoplasm of uncertain behavior of skin  04/17/2016: 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  No date: Psychiatric illness    ALLERGIES  Contrast dye [radiopaque agent]; Hydrochlorothiazide; Januvia [sitagliptin]; Lyrica [pregabalin]; Omnipaque [iohexol]; Prozac [fluoxetine hcl]; Sulfa (sulfonamide antibiotics); and Topiramate    CURRENT MEDICATIONS    Current Outpatient Prescriptions:     Amlodipine (NORVASC) 10 mg Tablet, Take 1 tablet by mouth every day., Disp: 90 tablet, Rfl: 3    Atorvastatin (LIPITOR) 10 mg Tablet, Take 1 tablet by mouth every day at bedtime., Disp: 90 tablet, Rfl: 3    Blood Sugar Diagnostic (ONETOUCH ULTRA TEST) Strips, Use to test blood glucose 3x/day, Disp: 300 strip, Rfl: 11    empagliflozin (JARDIANCE) 25 mg Tablet, Take 1 tablet by mouth every day., Disp: 90 tablet, Rfl: 3    FLUTICASONE/SALMETEROL (ADVAIR DISKUS INHA), Take  by inhalation., Disp: , Rfl:     GlipiZIDE (GLUCOTROL XL) 10 mg SR Tablet, Take 2 tablets by mouth every morning with a meal. (diabetes), Disp: 60 tablet, Rfl: 5    Losartan (COZAAR) 25 mg Tablet, Take 1 tablet by mouth every day., Disp: 90 tablet, Rfl: 3    Metformin (GLUCOPHAGE) 1,000 mg tablet, Take 1 tablet by mouth 2 times daily with meals. (diabetes), Disp: 180 tablet, Rfl: 3    Omeprazole (PRILOSEC) 20 mg Delayed Release Capsule, Take 1 capsule by mouth two times daily before meals., Disp: 60 capsule, Rfl: 5    Ondansetron (ZOFRAN, AS HYDROCHLORIDE,) 8 mg Tablet, Take 1 tablet by mouth every 8 hours if needed., Disp: 270 tablet, Rfl: 3    PEG 3350-Electrolytes-Vit C (MOVIPREP) 100-7.5-2.691 gram Powder in Packet, Take as directed by GI instruction sheet, Disp: 1 packet, Rfl: 0    Triamcinolone (KENALOG) 0.1 % Cream, Apply to  the affected area 2 times daily. Use twice a day for two weeks on the chest lesion., Disp: 30 g, Rfl: 1    Venlafaxine ([name]) 75 mg Tablet, Take 2 tabs by mouth every morning and 1 tab every evening., Disp: 90 tablet, Rfl: 11        >>>PHYSICAL EXAMINATION  Temp: 36.8 C (98.3 F) (07/30 0925)  Temp src: Temporal (07/30 0925)  Pulse: 82 (07/30 0925)  BP: 131/74 (07/30 0925)  Resp: 16 (07/30 0925)  SpO2: 96 % (07/30 0925)  Height: 160 cm (5' 2.99") (07/30 0925)  Weight: 81.6 kg (179 lb 14.3 oz) (07/30 0925)    General Appearance: healthy, alert, no distress, pleasant affect, cooperative.  Eyes:  conjunctivae and corneas clear. PERRL, EOM's intact. sclerae normal.  Mouth: normal.  Neck:  Neck supple.   Heart:  normal rate and regular rhythm, no murmurs, clicks, or gallops.  Lungs: clear to auscultation.  Abdomen: Obese, soft, non-tender.  No masses or organomegaly.  Neuro: Gait normal. Strength grossly normal.  Mental Status: interacts appropriately, good eye contact.     PATHOLOGY   EMR reviewed    LABORATORY    Patient Active Problem List   WHITE BLOOD CELL COUNT (K/MM3)   Date Value   08/30/2015 8.9     White Blood Cell Count (K/MM3)   Date Value   11/11/2016 8.7     HEMOGLOBIN (g/dL)   Date Value   08/30/2015 14.4     Hemoglobin (g/dL)   Date Value   11/11/2016 13.1     MCV (UM3)   Date Value   11/11/2016 80.5   08/30/2015 83.6     MCHC (%)   Date Value   11/11/2016 32.4   08/30/2015 32.3     RDW   Date Value   11/11/2016 15.2 %   08/30/2015 15.6 UNITS     PLATELET COUNT (K/MM3)   Date Value   08/30/2015 217     Platelet Count (K/MM3)   Date Value   11/11/2016 271     SODIUM (mEq/L)   Date Value   08/30/2015 142     Sodium (mmol/L)   Date Value   11/11/2016 138     POTASSIUM (mEq/L)   Date Value   08/30/2015 4.0     Potassium (mmol/L)   Date Value   11/11/2016 3.8     CHLORIDE (mEq/L)   Date Value   08/30/2015 104     Chloride (mmol/L)   Date Value   11/11/2016 101     CARBON DIOXIDE TOTAL (mEq/L)   Date Value    08/30/2015 22     Carbon Dioxide Total (mmol/L)   Date Value   11/11/2016 26     CREATININE BLOOD (mg/dL)   Date Value   08/30/2015 0.88     Creatinine Serum (mg/dL)   Date Value   11/11/2016 1.05     UREA NITROGEN, BLOOD (BUN) (mg/dL)   Date Value   08/30/2015 19     Urea Nitrogen, Blood (BUN) (mg/dL)   Date Value   11/11/2016 12       >>>IMPRESSION  1. 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 Gastroparesis  3.    Nausea and abdominal pain  4. Left adrenal gland adenoma, stable  5. 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 Duodenal neuroendocrine tumors are relatively rare and comprise only2-3% of all GI endocrine tumors. The majority of duodenal NET are nonfunctional and indolent with benign progression. It sometimes could cause Zollinger-Chilton syndrome and other clinical hormonal syndromes such as Cushing. Patient's normal gastrin level ruled out ZES.      Patient has localized disease, and complete resection of the tumor would be the primary treatment.  Patient underwent incomplete resection via polypectomy in Oct 2017.  There is currently no radiographic or pathologic evidence of disease.  GSeth.ContesDOTATATE PET/CT s/p polypectomy in 2017 reveals no evidence of residual or metastatic disease.  Repeat EGD/EUS revealed a small bumpy raised area at the base of the the hemoclip  but pathology showed only duodenal mucosa with reactive changes without any neuroendocrine tumor present.  Will repeat DOTATATE PET scan and send for 5-HIAA.  If both are negative, will continue surveillance with routine endoscopic surveillance and consider biochemical marker  and scan as clinically indicated.  Note patient also has diabetes and gastroparesis which may be contributing to her GI symptoms instead.  Patient and family verbalized understanding of the plan and agree to proceed.    REVIEW OF RECOMMENDATIONS  1. Diagnostics:   -will send fo 5HIAA   -repeat DOTATATE scan in sept, then imaging as clinically indicated   -routine scope surveillance per GI   -routine colonoscopy     2. Therapy:        -surveillance as above    3. Followup:       -in 2 months with repeat scan    Side effects of above therapy were discussed at length and patient acknowledged risks and benefits associated with treatment.      Thank you for allowing me to participate in the care of the patient.   Total time of this office visit was 35 minutes.  Greater than 50% of the time was spent discussing the patient's disease, prognosis, symptoms, and treatment plan.    Sincerely,    Electronically signed by:  Marlow Baars, MD, MHS  Assistant Professor of Medicine  Hematology Oncology Division  Pager: [phone]

## 2016-12-03 ENCOUNTER — Encounter: Payer: Self-pay | Admitting: Family Medicine

## 2016-12-03 ENCOUNTER — Ambulatory Visit
Admission: RE | Admit: 2016-12-03 | Discharge: 2016-12-03 | Disposition: A | Discharge status: Home / Self Care | Payer: Medicare Other | Source: Ambulatory Visit | Attending: Diagnostic Radiology | Admitting: Diagnostic Radiology

## 2016-12-03 DIAGNOSIS — K7460 Unspecified cirrhosis of liver: Secondary | ICD-10-CM

## 2016-12-03 DIAGNOSIS — E278 Other specified disorders of adrenal gland: Secondary | ICD-10-CM

## 2016-12-03 DIAGNOSIS — E279 Disorder of adrenal gland, unspecified: Secondary | ICD-10-CM | POA: Insufficient documentation

## 2016-12-03 DIAGNOSIS — K760 Fatty (change of) liver, not elsewhere classified: Secondary | ICD-10-CM

## 2016-12-05 ENCOUNTER — Ambulatory Visit
Admission: RE | Admit: 2016-12-05 | Discharge: 2016-12-05 | Disposition: A | Discharge status: Home / Self Care | Payer: Medicare Other | Source: Ambulatory Visit | Attending: Family Medicine | Admitting: Family Medicine

## 2016-12-05 ENCOUNTER — Ambulatory Visit (INDEPENDENT_AMBULATORY_CARE_PROVIDER_SITE_OTHER): Payer: Medicare Other

## 2016-12-05 DIAGNOSIS — Z1231 Encounter for screening mammogram for malignant neoplasm of breast: Secondary | ICD-10-CM

## 2016-12-05 DIAGNOSIS — R079 Chest pain, unspecified: Secondary | ICD-10-CM

## 2016-12-05 DIAGNOSIS — Z1239 Encounter for other screening for malignant neoplasm of breast: Secondary | ICD-10-CM

## 2016-12-05 NOTE — Progress Notes (Signed)
CARDIOLOGY PROCEDURE NOTE    Dear [name], MD     Paula Daugherty was seen today for an exercise treadmill test to evaluate chest pain. ETT was negative for ischemia. Patient exercised for 5 min and 59 sec, reaching 7.0 Mets.      Patient developed very mild chest pressure during exercise which resolved while still exercising.    Findings/results described to patient in detail at the end of the exam. Official report will done separately in [place].    Please contact me with any questions or concerns.     Electronically signed by:  Valeta Harms, MD   Cardiology,  [facility]  Pager [phone]

## 2016-12-06 LAB — ELECTROCARDIOGRAM WITH RHYTHM STRIP: QTC: 478

## 2016-12-07 DIAGNOSIS — R1084 Generalized abdominal pain: Secondary | ICD-10-CM | POA: Insufficient documentation

## 2016-12-07 DIAGNOSIS — R110 Nausea: Secondary | ICD-10-CM | POA: Insufficient documentation

## 2016-12-10 ENCOUNTER — Telehealth: Payer: Self-pay | Admitting: GASTROENTEROLOGY

## 2016-12-10 NOTE — Telephone Encounter (Signed)
Left message for patient to return call to r/s [phone] appt with Doctor Chak. Please schedule upon call back.

## 2016-12-11 ENCOUNTER — Encounter: Payer: Self-pay | Admitting: "Endocrinology

## 2016-12-15 NOTE — Telephone Encounter (Signed)
Patient scheduled in October

## 2016-12-15 NOTE — Progress Notes (Signed)
12/22/2016    Chief Complaint: Patient presents today with chest pain.     Paula Daugherty is a 71yrold female referred by Dr. NJamelle Haring MD  for evaluation of chest pain.  Here with husband.    Patient  With history of Type 2 diabetes, hypertension, hyperlipidemia.   Recent office visit with NJamelle Haring MD and complaints of chest pressure new over past 2 months. EKG with right bundle branch block; no significant change compared with prior.  Had treadmill test done 12/05/16: negative. When patient did do the treadmill test she did have some chest burning that was very uncomfortable at test end.     Onset of symptoms 2+ months  ago.  Described as pressure quality, precordial location and mild-moderate severity--sporadic.  Lasts 15+ minutes.  Associated with sitting activity.  Factors that worsen: no; factors that improve: no:   Accompanied by signs/sx none.      Exercise routine: [place]  Going to join gym.     History of Heart Disease:      no  history of chest discomfort or dyspnea with exertion      no   history of known prior CAD      no  history of prior angiography/PCI/CABG or val[place]ular [place]this was OK.  At least 6 years ago.    no  history of CHF/orthopnea, PND or peripheral edema      no history of syncope/pre-syncope     no history palpitations or documented arrhythmia    no documented obstructive sleep apnea    no hx of transient ischemic attack/cerebrovascular accident    no history of deep venous thrombosis/pulmonary embolus    no history of claudication/known peripheral vascular disease    no history of known clotting disorders;  no history of prior anticoagulation    Cardiac risk factors:    HTN:  yes  ; DM: yes ;  Smoking: former ;  Hyperlipidemia: yes;  FamHx premature CAD:  Yes--mother;  Sedentary lifestyle:  yes  ;   ; chronic kidney disease:  no    Hypertension: Patient has not been checking ambulatory blood pressure. She takes medications regularly.   Checks on occasion at pharmacy.      Past Medical History:   Diagnosis Date    Asthma     Cirrhosis     Diabetes mellitus     Hypertension     Kidney disease     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/20/2016    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 04/17/2016    Psychiatric illness        Past Surgical History:   Procedure Laterality Date    COLONOSCOPY  01/04/14    polyp--TA and erosion; hemorrhoids; tics; repeat x 3 yrs    MAMMOPLASTY, REDUCTION  1985    PHACOEMULSIFICATION, CATARACT Right 02/12/2015    with IOL    PR ESOPHAGOGASTRODUODENOSCOPY TRANSORAL DIAGNOSTIC  02/12/2016    EGD--polyp--path pending; antral gastritis; no varices     PR ESOPHAGOGASTRODUODENOSCOPY TRANSORAL DIAGNOSTIC  06/11/2016    EGD--biopsy at site of carcinoid tumor path pending     PR KNEE SCOPE,DIAGNOSTIC  1980s    Knee arthroscopy    PR LIGATE FALLOPIAN TUBE      Tubal ligation    PR REPAIR TYMPANIC MEMBRANE      Tympanoplasty  PR TOTAL ABDOM HYSTERECTOMY  1980s    partial; no BSO    REPAIR, BLEPHAROPTOSIS Bilateral 08/06/2015    Blepharoplasty bilateral upper lids       Family History   Problem Relation Age of Onset    Diabetes Father     Heart Mother      MI at 11s     Non-contributory Brother     Non-contributory Brother        Social History     Social History    Marital status: MARRIED     Spouse name: N/A    Number of children: 1    Years of education: N/A     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[employer]       Social History Main Topics    Smoking status: Former Smoker     Packs/day: 0.10     Years: 20.00    Smokeless tobacco: Former Systems developer      Comment: quit 30 years ago    Alcohol use 0.0 oz/week     0 Standard drinks or equivalent per week      Comment: rare shares a beer or glass wine every 2-3 months     Drug use: No    Sexual activity: Yes     Partners: Male      Other Topics Concern    Husband is my patient as well     Social History Narrative       Current Outpatient Prescriptions   Medication Sig    Amlodipine (NORVASC) 10 mg Tablet Take 1 tablet by mouth every day.    Atorvastatin (LIPITOR) 10 mg Tablet Take 1 tablet by mouth every day at bedtime.    Blood Sugar Diagnostic (ONETOUCH ULTRA TEST) Strips Use to test blood glucose 3x/day    empagliflozin (JARDIANCE) 25 mg Tablet Take 1 tablet by mouth every day.    FLUTICASONE/SALMETEROL (ADVAIR DISKUS INHA) Take  by inhalation.    GlipiZIDE (GLUCOTROL XL) 10 mg SR Tablet Take 2 tablets by mouth every morning with a meal. (diabetes)    Losartan (COZAAR) 25 mg Tablet Take 1 tablet by mouth every day.    Metformin (GLUCOPHAGE) 1,000 mg tablet Take 1 tablet by mouth 2 times daily with meals. (diabetes)    Omeprazole (PRILOSEC) 20 mg Delayed Release Capsule Take 1 capsule by mouth two times daily before meals.    Ondansetron (ZOFRAN, AS HYDROCHLORIDE,) 8 mg Tablet Take 1 tablet by mouth every 8 hours if needed.    PEG 3350-Electrolytes-Vit C (MOVIPREP) 100-7.5-2.691 gram Powder in Packet Take as directed by GI instruction sheet    Triamcinolone (KENALOG) 0.1 % Cream Apply to the affected area 2 times daily. Use twice a day for two weeks on the chest lesion.    Venlafaxine ([name]) 75 mg Tablet Take 2 tabs by mouth every morning and 1 tab every evening.     No current facility-administered medications for this visit.        Allergies:    Contrast Dye [Radiopaque Agent]    Hives  Hydrochlorothiazide    Other-Reaction in Comments    Comment:Patient reports  Januvia [Sitagliptin]    Other-Reaction in Comments    Comment:Patient reports  Lyrica [Pregabalin]    Other-Reaction in Comments    Comment:Patient reports  Omnipaque [Iohexol]    Other-Reaction in Comments    Comment:Patient reports  Prozac [Fluoxetine Hcl]    Other-Reaction in Comments    Comment:Patient  reports  Sulfa (Sulfonamide Antibiotics)     Rash  Topiramate    Other-Reaction in Comments    Comment:Hairfall    Review of Systems:    General:  No fever, chills.   No fatigue.   no Recent change in weight:  Allergy/Immunology: no  HEENT:  No blurry vision, double vision.  No hx of nosebleeds.    Lungs:  No wheezes; yes cough.   Cardiovascular: see above.  No hx of known prior rheumatic fever  GI:   no blood in stool or black stool/peptic ulcer disease;   yes history gastroesophageal reflux disease/hiatal hernia. Nonalcoholic steatohepatitis.   GU:  no hematuria;  no difficulty with urination/BPH. yes history of kidney disease--due to diabetes. .   Heme/Onc:  No bleeding problems.  No excessive bruising.   Endo:  No excessive heat/cold intolerance or excessive thirst.  no history of thyroid disease. History adrenal nodule.   Musculoskeletal:  no myalgias or muscle weakness;  no degenerative joint disease ; no history of gout  Neuro: no balance problems or falls.  No hx seizures.  no history of chronic headaches  Skin:  No rash.  Psych:  yes hx of anxiety/depression.  All others negative.    PHYSICAL EXAM:    LMP 05/06/1983;   BP 138/81  Pulse 78  Ht 1.6 m (5' 2.99")  Wt 81.5 kg (179 lb 10.8 oz)  LMP 05/06/1983  SpO2 95%  BMI 31.84 kg/m2  Prior weight was up to 204#      General:  Well nourished, healthy appearing and in no acute distress.  Appears stated age.  HEENT:  NC/AT, EOMI.  Conjunctivae pink.  Sclerae noninjected.  Normal voice.  Normal dentition.    Neck:  Normal carotid upstrokes, no JVD or bruits.  No masses or thyromegaly.  No palpable lymphnodes.  Lungs:  Clear without crackles or wheezes.  Breath sounds equal bilaterally  Cor:  PMI nondisplaced.  Regular rate and rhythm without murmur.  No gallops.  Abdomen:  Normal bowel sounds.  Soft and non-tender.  No appreciable organomegaly.  Ext:  No edema.  2+ DP/PT pulses.  Symmetrical radial pulses.  Skin:  Warm and dry without rash.  No bruises.  Neuro:  Alert and oriented.  Moves all  extremities.  Normal gait.  Back:  No deformity  Psych: cooperative; normal affect      LAB results:  Lab Results   Lab Name Value Date/Time    CHOL 179 11/11/2016 11:09 AM    CHOL 158 08/30/2015 08:35 AM    LDLC 84 11/11/2016 11:09 AM    LDLC 74 08/30/2015 08:35 AM    HDL 54 11/11/2016 11:09 AM    HDL 58 08/30/2015 08:35 AM    TRIG 206 (H) 11/11/2016 11:09 AM    TRIG 132 08/30/2015 08:35 AM     Results for CYSTAL, SHANNAHAN (MRN [id number]) as of 12/15/2016 09:46   Ref. Range 11/11/2016 11:09   SODIUM Latest Ref Range: 135 - 145 mmol/L 138   POTASSIUM Latest Ref Range: 3.3 - 5.0 mmol/L 3.8   CHLORIDE Latest Ref Range: 95 - 110 mmol/L 101   CARBON DIOXIDE TOTAL Latest Ref Range: 24 - 32 mmol/L 26   UREA NITROGEN, BLOOD (BUN) Latest Ref Range: 8 - 22 mg/dL 12   CREATININE BLOOD Latest Ref Range: 0.44 - 1.27 mg/dL 1.05   GLUCOSE Latest Ref Range: 70 - 99 mg/dL 156 (H)   CALCIUM Latest Ref Range: 8.6 - 10.5  mg/dL 9.4   E-GFR, AFRICAN AMERICAN Latest Ref Range: >60 See Note mL/min/1.9m2 >60   E-GFR, NON-AFRICAN AMERICAN Latest Ref Range: >60 See Note mL/min/1.757m 5584 Results for MAMALY, LEMARRMRN [id number] of 12/15/2016 09:46   Ref. Range 11/11/2016 11:09   ALDOSTERONE,BLOOD Latest Units: ng/dL 16.6   HGB A1C Latest Ref Range: 3.9 - 5.6 % 7.8 (H)       Prior EKG (personally reviewed): 12/05/16: normal sinus rhythm. Right bundle branch block. No significant change compared with prior.       Results of prior imaging (if available):    No interval studies performed     Current Cardiology Problem List:    1.  Chest pain--some concerning features   2.  Type 2 diabetes   3.  Hypertension--reasonable control   4.  Hyperlipidemia--on statin   5. Treadmill test 12/2016: negative   6. Former smoker  7. Positive family history premature coronary artery disease     Patient Active Problem List   Diagnosis    DM2 (diabetes mellitus, type 2)    Depression with anxiety    Stress at home    Leg cramps-right calf    Right ear pain     Fibromyalgia    HTN (hypertension)    GERD (gastroesophageal reflux disease)    Hyperlipidemia with target LDL less than 70    Vitamin D insufficiency    Abnormal LFTs    Renal cyst ( 6.4 cm cyst left kidney)    Cough    Type 2 diabetes mellitus without complication    Fatty liver    Macroalbuminuric diabetic nephropathy    History of actinic keratoses    Cataract    Ptosis of eyelid    Liver fibrosis    Adrenal nodule    Medication Therapy Auth    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    Mixed conductive and sensorineural hearing loss of both ears    Neoplasm of uncertain behavior of skin    Abdominal pain, generalized    Nausea without vomiting       Plan/Recommendations:    Some concerning symptoms in light of cardiac risk factor profile.  Also had chest burning at end of test.      1. PET myocardial perfusion study--evaluation for ischemia  2. Could consider increase atorvastatin 20.      Additional information/recommendations provided to patient:  none    I have spent 40 minutes with the patient with >50% of the time spent in counseling about options for stress testing, need for myocardial perfusion study as well as discussion of risk factor modification and coordination of care.  The patient verbalizes understanding and agrees with the plan of care.  All questions/concerns were addressed to the satisfaction of the patient/family.    I did review patient's past medical and family/social history, no changes noted.  Barriers to Learning assessed: none. Patient verbalizes understanding of teaching and instructions.      M.Gaye PollackMD, FAThe Centers Inc Board Certified Echocardiography  Associate Physician  [facility]  [phone]: [phone]

## 2016-12-18 NOTE — Telephone Encounter (Signed)
From: [name]  To: Kit Lucilla Edin, MD  Sent: 12/17/2016 8:11 AM PDT  Subject: Visit Follow-up Question    Hi Dr., wanted to recheck with you about when you wanted the PET redone. We thought you said Oct or Nov be just not sure so wanted to check. Hope you are feeling well. See you in Sept.

## 2016-12-19 ENCOUNTER — Encounter: Payer: Self-pay | Admitting: "Endocrinology

## 2016-12-19 ENCOUNTER — Ambulatory Visit: Payer: Medicare Other | Admitting: "Endocrinology

## 2016-12-19 VITALS — BP 138/87 | HR 80 | Temp 97.5°F | Wt 179.5 lb

## 2016-12-19 DIAGNOSIS — Z794 Long term (current) use of insulin: Secondary | ICD-10-CM

## 2016-12-19 DIAGNOSIS — E1129 Type 2 diabetes mellitus with other diabetic kidney complication: Secondary | ICD-10-CM

## 2016-12-19 DIAGNOSIS — E119 Type 2 diabetes mellitus without complications: Secondary | ICD-10-CM

## 2016-12-19 DIAGNOSIS — E279 Disorder of adrenal gland, unspecified: Secondary | ICD-10-CM

## 2016-12-19 MED ORDER — INSULIN GLARGINE (U-100) 100 UNIT/ML (3 ML) SUBCUTANEOUS PEN: 8.0000 [IU] | PEN_INJECTOR | Freq: Every day | SUBCUTANEOUS | 11 refills | Status: DC

## 2016-12-19 MED ORDER — PEN NEEDLE, DIABETIC 31 GAUGE X 5/16"
11 refills | Status: AC
Start: 2016-12-19 — End: 2017-12-19

## 2016-12-19 NOTE — Nursing Note (Signed)
Vital signs taken, allergies verified, screened for pain, tobacco hx verified.     Annalysa Mohammad, MA

## 2016-12-19 NOTE — Patient Instructions (Signed)
1. No changes to metformin, jardiance or glipizide doses at this time    2. Please start Lantus 8 units every night  - Increase by 1 unit every 3 days if morning, fasting blood sugar is >150  - Keep increasing by 1 unit every 3 days if morning, fasting blood sugar remains >150    3. Please send your blood sugar numbers to Dr. Rosary Lively in ~1 week    4. Return to clinic in ~3 months with non-fasting blood work first

## 2016-12-19 NOTE — Progress Notes (Signed)
Endocrinology Follow up clinic note:    Chief Complaint:  "I'm here to follow up on my diabetes and adrenal nodule."    HPI: Paula Daugherty is a 71yrold female who has a diagnosis of type 2 diabetes mellitus who presents to Endocrinology for follow up. Her history is as follows:  She was initially diagnosed with diabetes around 2000 on routine labs. She has a strong family history of diabetes so she wasn't surprised at this. She was started on Metformin around 2005 and then she was started on insulin in 2014. She was up to 64 units of Lantus at night. However, after making changes to her diet and lifestyle, she was taken off insulin in 2015.        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    Interim History: She returns today for follow up. Her last visit with Endocrinology was on 09/26/2016. She states that overall, since her last clinic visit, she feels that things are about the same. She has continued to take her medications consistently, however, her morning blood glucose has continued to run on the higher side. She has been working on her diet with her husband to eat smaller portion sizes (split meals when they go out to eat) and trying to eat healthier overall. [place] ate smaller portion sizes and walked throughout the day.     She has been following with GI, and oncology. Her nausea is consistent but overall managable when she takes prn zofran. She will have another PET scan done in September to follow up on her neuroendocrine tumor.     Her only other complaint today is of some increased burning in the bottom of her feet.     Current Regimen:     Metformin 1000 mg BID   Glipizide XR 20 mg daily   Jardiance 25 mg daily  Hypoglycemia: Had 1 value in the 70s >1 month ago  Hyperglycemia: Yes, has been running higher overall  Meter brought today: Yes  Checks blood glucose: 2x/day  Home blood glucose  numbers per pt's log:    Blood Glucose:                         Pre-breakfast: 133 to 216                       Pre-dinner: 112 to 186  Last eye exam: 06/03/2016, no retinopathy, f/u 1 year  Foot exam: Will do today    ROS:  All other systems were reviewed and are negatative except for pertinent positive and negative responses as documented in HPI.     Medications:  Medication reconciliation was performed today.   Current Outpatient Prescriptions on File Prior to Visit   Medication Sig Dispense Refill    Amlodipine (NORVASC) 10 mg Tablet Take 1 tablet by mouth every day. 90 tablet 3    Atorvastatin (LIPITOR) 10 mg Tablet Take 1 tablet by mouth every day at bedtime. 90 tablet 3    Blood Sugar Diagnostic (ONETOUCH ULTRA TEST) Strips Use to test blood glucose 3x/day 300 strip 11    empagliflozin (JARDIANCE) 25 mg Tablet Take 1 tablet by mouth every day. 90 tablet 3    FLUTICASONE/SALMETEROL (ADVAIR DISKUS INHA) Take  by inhalation.      GlipiZIDE (GLUCOTROL XL) 10 mg  SR Tablet Take 2 tablets by mouth every morning with a meal. (diabetes) 60 tablet 5    Losartan (COZAAR) 25 mg Tablet Take 1 tablet by mouth every day. 90 tablet 3    Metformin (GLUCOPHAGE) 1,000 mg tablet Take 1 tablet by mouth 2 times daily with meals. (diabetes) 180 tablet 3    Omeprazole (PRILOSEC) 20 mg Delayed Release Capsule Take 1 capsule by mouth two times daily before meals. 60 capsule 5    Ondansetron (ZOFRAN, AS HYDROCHLORIDE,) 8 mg Tablet Take 1 tablet by mouth every 8 hours if needed. 270 tablet 3    PEG 3350-Electrolytes-Vit C (MOVIPREP) 100-7.5-2.691 gram Powder in Packet Take as directed by GI instruction sheet 1 packet 0    Triamcinolone (KENALOG) 0.1 % Cream Apply to the affected area 2 times daily. Use twice a day for two weeks on the chest lesion. 30 g 1    Venlafaxine ([name]) 75 mg Tablet Take 2 tabs by mouth every morning and 1 tab every evening. 90 tablet 11     No current facility-administered medications on file  prior to visit.      I did review patient's past medical and family/social history, no changes noted.   PMH:  Past medical history was reviewed from problem list.   Patient Active Problem List   Diagnosis    DM2 (diabetes mellitus, type 2)    Depression with anxiety    Stress at home    Leg cramps-right calf    Right ear pain    Fibromyalgia    HTN (hypertension)    GERD (gastroesophageal reflux disease)    Hyperlipidemia with target LDL less than 70    Vitamin D insufficiency    Abnormal LFTs    Renal cyst ( 6.4 cm cyst left kidney)    Cough    Type 2 diabetes mellitus without complication    Fatty liver    Macroalbuminuric diabetic nephropathy    History of actinic keratoses    Cataract    Ptosis of eyelid    Liver fibrosis    Adrenal nodule    Medication Therapy Auth    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    Mixed conductive and sensorineural hearing loss of both ears    Neoplasm of uncertain behavior of skin    Abdominal pain, generalized    Nausea without vomiting     VITAL SIGNS:  BP 138/87  Pulse 80  Temp 36.4 C (97.5 F) (Oral)  Wt 81.4 kg (179 lb 7.3 oz)  LMP 05/06/1983  BMI 31.8 kg/m2  Body mass index is 31.8 kg/(m^2).    PHYSICAL EXAM:  General Appearance: healthy, alert, no distress, pleasant affect, cooperative.  Eyes:  conjunctivae and corneas clear. PERRL, EOM's intact. sclerae normal.  Mouth: normal.  Neck:  Neck supple. No adenopathy, thyroid symmetric, normal size.  Heart:  normal rate and regular rhythm, no murmurs, clicks, or gallops.  Lungs: clear to auscultation.  Abdomen: BS normal.  Abdomen soft, non-tender.  No masses or organomegaly.  Extremities:  no cyanosis, clubbing, or edema.  Foot Exam: normal DP and PT pulses, no trophic changes or ulcerative lesions, normal sensory exam and normal monofilament exam.  Skin:  Skin color, texture, turgor normal. No rashes or lesions.  Neuro: Gait normal. No tremor appreciated. Sensation and strength grossly  normal.  Mental Status: Appearance/Cooperation: in no apparent distress and well developed and well nourished  Eye Contact: normal  Speech: normal volume, rate, and pitch  LAB TESTS/STUDIES:   I personally reviewed the following laboratory and imaging studies.     03/17/2016 10:32 07/09/2016 11:57 11/11/2016 11:09   SODIUM  138 138   POTASSIUM  3.6 3.8   CHLORIDE  103 101   CARBON DIOXIDE TOTAL  25 26   UREA NITROGEN, BLOOD (BUN)  15 12   CREATININE BLOOD  0.99 1.05   GLUCOSE  150 (H) 156 (H)   CALCIUM  9.0 9.4   PROTEIN  7.9    ALBUMIN  3.9    ALKALINE PHOSPHATASE (ALP)  116 (H)    ASPARTATE TRANSAMINASE (AST)  59 (H)    BILIRUBIN TOTAL  0.7    ALANINE TRANSFERASE (ALT)  59 (H)    E-GFR, AFRICAN AMERICAN  >60 >60   E-GFR, NON-AFRICAN AMERICAN  59 55   CHOLESTEROL  221 (H) 179   TRIGLYCERIDE  210 (H) 206 (H)   LDL CHOLESTEROL CALCULATION  123 84   HDL CHOLESTEROL  56 54   NON-HDL CHOLESTEROL  165 (H) 125   TOTAL CHOLESTEROL:HDL RATIO  3.9 3.3   ALDOSTERONE,BLOOD   16.6   HGB A1C 6.1 (H) 8.0 (H) 7.8 (H)   HGB A1C,GLUCOSE EST AVG 128 183 177   RENIN ACTIVITY   1.1   VITAMIN B12 197 (L)     VITAMIN D, 25 HYDROXY 34.4        11/11/2016 11:09   WHITE BLOOD CELL COUNT 8.7   HEMOGLOBIN 13.1   HEMATOCRIT 40.5   MCV 80.5   RDW 15.2 (H)   PLATELET COUNT 271      07/09/2016 12:23   CREATININE SPOT URINE 261.23   MICROALBUMIN URINE 173.8   MICROALBUMIN/CREATININE RATIO 665 (H)     11/18/2016:     Ref Range & Units 96moago      TOTAL VOLUME mL 1650    TIME OF COLLECTION hr 24    DOPAMINE,UR PER VOLUME ug/L 41    DOPAMINE,UR PER 24HR 77 - 324 ug/d 68 (L)    DOPAMINE,UR RATIO TO CRT 0 - 250 ug/g CRT 73    NOREPINEPHRINE,UR PER VOLUME ug/L 21    NOREPINEPHRINE,UR PER 24HR 16 - 71 ug/d 35    NOREPINEPHRINE,UR RATIO TO CRT 0 - 45 ug/g CRT 38    EPINEPHRINE,UR PER VOLUME ug/L <1    EPINEPHRINE,UR PER 24 HR 1 - 7 ug/d <2    EPINEPHRINE,UR RATIO TO CRT 0 - 20 ug/g CRT <2         Ref Range & Units 130mogo      TOTAL VOLUME mL 1650     TIME OF COLLECTION hr 24    CORTISOL,UR FREE PER VOLUME ug/L 16.50    CORTISOL,UR FREE PER 24HR <=45.0 ug/d 27.2    CORTISOL,UR FREE RATIO TO CRT ug/g CRT 29.46         Ref Range & Units 33m16moo      TOTAL VOLUME mL 1650    TIME OF COLLECTION hr 24    METANEPHRINE,UR-PER VOLUME ug/L 61    METANEPHRINE,UR-PER 24HR 39 - 143 ug/d 101    METANEPHRINE,UR-RATIO TO CRT 0 - 300 ug/g CRT 109    NORMETANEPHRINE,UR-PER VOLUME ug/L 304    NORMETANEPHRINE,UR-PER 24HR 109 - 393 ug/d 502 (H)    NORMETANEPHRIN,UR-RATIO TO CRT 0 - 400 ug/g CRT 543 (H)    METANEPHRINES INTERPRETATION  See Note    CREATININE,UR PER VOLUME mg/dL 56    CREATININE,UR PER  24HR 500 - 1400 mg/d 924      CT Adrenal nodule (12/03/2016):  FINDINGS:  Within limitations of lack of IV contrast:  Lower Chest: Coronary artery calcifications..  Liver: Low liver attenuation consistent with hepatic steatosis. Right  hepatic lobe is enlarged to 19.5 cm. Nodular contour of liver suggests  cirrhosis. Evaluation is limited without contrast.  Bile Ducts: Unremarkable.  Gallbladder: Unremarkable.  Pancreas: Unremarkable.  Spleen: Unremarkable.  Adrenal Glands: A stable 1.8 cm nodule in the left adrenal gland has  Hounsfield units between 0 and -20, consistent with a benign lipid rich  adenoma.  Kidneys: No hydronephrosis or radiopaque nephrolithiasis. Right kidney  grossly unremarkable. Interval decrease in size of a now 4.2 cm left renal  cyst which has thin peripheral calcifications, a Bosniak 2 cyst.  GI Tract: Unremarkable.  Peritoneal Cavity: No free fluid or free air.  Lymph Nodes: No lymphadenopathy. Stable prominent porta hepatis lymph node.  Major Vascular Structures: Moderate atherosclerotic calcifications of the  abdominal aorta and its major branches..  Soft Tissues: Calcified granuloma in the left buttock soft tissues..  Musculoskeletal: Mild degenerative changes of visualized spine.    IMPRESSION:  1. Stable 1.8 cm lipid rich adenoma in the  left adrenal gland.  2. Findings of hepatic steatosis and cirrhotic liver morphology.    DXA (10/26/2015):  FINDINGS:  Anatomic Location: AP LUMBAR SPINE  BMD (gm/cm2): 1.032   SD above (+)/below, (-) T score: -0.1   SD above (+)/below, (-) Z score: 1.8    Anatomic Location: LEFT FEMORAL NECK   BMD (gm/cm2): 0.585   SD above (+)/below, (-) T score: -2.4   SD above (+)/below, (-) Z score: -0.7    Anatomic Location: LEFT TOTAL HIP  BMD (gm/cm2): 1.100   SD above (+)/below, (-) T score: 1.3   SD above (+)/below, (-) Z score: 2.7     Impression: This is a 71yrold female with Diabetes Mellitus Type II who presents to Endocrinology for follow up. Overall, her glycemic control is above goal as evidenced by her most recent A1c of 8%.  Review of her blood glucose meter download today demonstrates that she has elevated blood glucose most of the time when she is checking. We discussed adding an additional medication for her diabetes regimen and one of Ms. Devora's goals is weight loss. Given her GI issues, I would be hesitant to add a GLP-1 receptor agonist as this can increase nausea/vomiting and we discussed SGLT-2 inhibitors today and the risks/benefits of this medication, including the common side effect of genital mycotic infections. After discussion, Ms. MElemis willing to try this medication and she will start Jardiance 10 mg daily.      She also has a history of an adrenal nodule. Hormonal evaluation was normal in 01/2016 and she will be due for repeat hormonal evaluation in 01/2017 and repeat imaging in 12/2016. Will decrease glipizide to 10 mg daily with this change.      DIABETES HISTORY  (-) h/o retinopathy.      (-) h/o microalbuminuria/nephropathy.  (-) h/o neuropathy.  (-) h/o Autonomic dysfunction  (-) h/o Gastroparesis  (-) h/o CAD  (-) h/o PVD     Last eye exam: 05/2016, f/u 1year  Last urine microalbumin: 07/2016, 665  Pneumovax: 12/2014  Influenza vaccine: 02/2016  (+) ASA  (-) Statin  (+) ACE/ARB           Recommendations:  (E11.29) Type 2 diabetes mellitus with other diabetic kidney complication  (  primary encounter diagnosis)  - Continue current dose of metformin, Jardiance and glipizide  - Start Lantus 8 units QHS and titrate up by 1 unit every 3 days for morning, fasting blood glucose >150  - Patient will send mychart message with blood glucose data in ~1 week after starting Lantus  - Will try to d/c glipizide based on blood glucose data  - Encouraged patient to continue blood glucose monitoring to at least 1x/day, goal to check 2x/day  - Last LDL at goal, continue statin, repeat due 11/2017  - Last microalbumin:creatinine ratio elevated, continue cozaar, repeat due 07/2017  - Up to date on screening eye exam, next due 05/2017  - Repeat A1c prior to follow up appointment  - Check vitamin B12 level given symptoms of peripheral neuropathy     2. Adrenal nodule  - Biochemical evaluation normal, repeat due in 11/2017 (~1 year from previous) for 5 years total (2022)     Approximately 30 minutes were spent with patient, greater than 50% of which was spent counseling the patient on diabetes and on coordination of care.     Follow up in 3 months       If you have any questions, please do not hesitate to contact me at [phone].  Thank you for allowing me to participate in the care of this patient.    EDUCATION:  I educated/instructed the patient or caregiver regarding all aspects of the above stated plan of care.  The patient or caregiver indicated understanding.      [place] interpreter was not used.    Report electronically signed by:  Sunday Spillers, M.D.  Clinical Assistant Professor  Department of Endocrinology  Pager # [phone]

## 2016-12-22 ENCOUNTER — Ambulatory Visit: Payer: Medicare Other | Admitting: Cardiovascular Disease

## 2016-12-22 ENCOUNTER — Encounter: Payer: Self-pay | Admitting: Cardiovascular Disease

## 2016-12-22 VITALS — BP 138/81 | HR 78 | Ht 62.99 in | Wt 179.7 lb

## 2016-12-22 DIAGNOSIS — I10 Essential (primary) hypertension: Secondary | ICD-10-CM

## 2016-12-22 DIAGNOSIS — I209 Angina pectoris, unspecified: Secondary | ICD-10-CM

## 2016-12-22 DIAGNOSIS — E785 Hyperlipidemia, unspecified: Secondary | ICD-10-CM

## 2016-12-22 DIAGNOSIS — E1121 Type 2 diabetes mellitus with diabetic nephropathy: Secondary | ICD-10-CM

## 2016-12-22 DIAGNOSIS — R079 Chest pain, unspecified: Secondary | ICD-10-CM

## 2016-12-22 NOTE — Nursing Note (Signed)
Vital signs taken, allergies verified, screened for pain.  Orvil Faraone, LVN

## 2016-12-25 ENCOUNTER — Ambulatory Visit: Payer: Medicare Other

## 2016-12-25 DIAGNOSIS — E119 Type 2 diabetes mellitus without complications: Secondary | ICD-10-CM

## 2016-12-25 NOTE — Patient Instructions (Signed)
Nutrition Notes:    1) Walking  GOAL: 10 minutes, 3+ times/week  IDEA: Add 2-5 minutes to each walk Or add additional day OR carry hand weights (adds intensity)     More if it fits   Evening when cooler OR morning (8am) - walk first (try 2x/week), husband wakes me up   Pay attention to how you feel - more energy, motivation to eat a little better, stress reduction   Consider testing before walking and after coming back (look at drop)   Track it - calendar (post on fridge)   See how many mnutes per week you get, or highest number of minutes on 1 walk       2) Grill veggies - add to meals          Basics Principles of Healthy Lifestyle    Physical Activity:   Minimum for Health = 30 minutes, 5+ times/week   Goal = 60+ minutes, 5+ times/week (include 3 days of strength-based activity    Fruits & Vegetables:   9 servings/day   1 serving =    1 cup raw leafy greens   1/2 cup cut up fruit/vegetable   1 small, whole fruit   Incorporate fruits & vegetables into EVERY meal/snack   1/2 your plate should be vegetables       Physical Activity Guidelines    For General Health:   At least 150 minutes a week of moderate-intensity    (2 hours and 30 minutes)   OR 75 minutes a week of vigorous-intensity aerobic physical activity   (1 hour and 15 minutes)     Aerobic activity should be performed in episodes of at least 10 minutes, and preferably, it should be spread throughout the week.     For Greater Health Benefit:   aerobic physical activity goal of 300 minutes  a week of moderate-intensity   (5 hours)   OR 150 minutes a week of vigorous-intensity aerobic physical activity     Additional health benefits are gained by engaging in physical activity beyond this amount.      muscle-strengthening activities 2 or more days a week   (moderate or high intensity & involve all major muscle groups)      10,000 Steps a Day    Question: What about getting 10,000 steps per day?    Answer: This recommendation is not  based on scientific evidence. The 10,000 step recommendation comes from a pedometer that was sold in [place] in the 1960's.The name of the pedometer translated into "10,000 step meter.     Its important to know that, getting more activity than normal is beneficial. But intensity matters. 10,000 steps that are slow and don't get your heart up, is not the same as 10,000 steps at a steady pace.      Tip: Set a personal activity goal. Try 5-10 minutes more than you get right now. As you achieve your goal, set a new goal and keep increasing your activity.    Tip: If you currently average 4000 steps per day, bump up your daily goal. Set a goal to get 4500 steps/day. Keep increasing your daily step goal each week!      "RESOURCES"   Check out You Tube video - "23 1/2 hours: What is the single best thing you can do for your health"   Book: "Intuitive Eating" by Ernst Spell

## 2016-12-25 NOTE — Nurse Focus (Signed)
Test ordered PET Cardiac Stress Test   Instructed to arrive at [facility] room 1776 at [place] to:   Hold Caffeine for 12 hoursyes   NPO for 4 hoursyes   Clear liquids for 2 hoursyes    If patient is diabetic, instructed to hold am diabetic medicationsYes   If on Lantus instructed to take 1/2 of usual dose.      Patient instructed to take all regular medications with the exception of any medications that may have caffeine or any phosphodiesterase Type 5 (PDE5)   inhibitor classof erectile dysfunction (ED) drugs, Cialis, Levitra, Viagra, etc. Those medications should be held for 48 hours prior to the stress exam. Yes    Instructed length of stay for PET approximately 2 hours and Spect approximately 3-4 hours.yes    Spoke with: message machine   Comments: phone [phone] if questions

## 2016-12-25 NOTE — Progress Notes (Signed)
ADULT NUTRITION ASSESSMENT   Clinic: [facility]  12/25/2016    Reason For Assessment:   Referring Diagnosis: DM2, gastroparesis  Referring Provider: [name], MD    Nutrition Assessment   Paula Daugherty is a 71yrold female    Pertinent Medical History:    Cardiovascular: Hyperlipidemia and Hypertension  Endocrine/metabolism: Type 2 Diabetes mellitus and Obesity  Gastrointestinal: gastroparesis  Other: fatty liver    Patient/Other reports: Returning to stay with mother in 1 week, difficulty staying on track in that environment. Tried Premier Protein - throat closed, using special K protein shake (18g sugar). Recurrent vomiting with gastroparesis/stomach pain - unclear cause/trigger. Proud of small weight loss, maintaining. Recent trips with more walking and conscious of portions.  Challenges: budy with travel/BBQ judging, doctor's appointments.    Testing BG daily, fasting 175-200 mg/day. Afternoon BG 1480mdL.    Started lantus 1 week ago, HS 9 units (increaseing 1 by unit every 3 days if numbers above 150).    Social History: husband attended visit  Living/housing situation: lives with husband, husband does meal prep  Occupation: retired  Stress: high due to care of ederly parents and daughter    FoHaematologistnd nutrition related history:    Previously prescribed diets: Diabetic  Previous nutrition education/counseling: [place] (2015 & 07/2016)  Self selected diet/s followed: Regular    Diet Recall:    Smaller meals, shared foods eaten out   More fruit as dessert    Breakfast: tea (1 teaspoon honey) SKIPS OR a few times/week cream of wheat packet (flavored) + milk  Lunch (11am): Eaten out - eggs + bacon + toast OR ham and cheese (bell pepper) omlet + fruit  Snack (2-3pm): fruit (canned peaches) OR fresh fruit Or chips + guac/salsa  Dinner (6pm): Ribs + grilled watermelon + biscuits (1) OR 2 pizza (pepperoni) OR popcorn (no butter)  Snack (8pm): ice cream + fresh banana OR fruit OR watermelon  Beverages: herbal tea,  water, unsweetened iced tea, soda (regular) 1/week, low sugar gatorade  Eating Out: 3x/week - once/day    Food Allergies: NKFA    Physical Activity:   Physical activity history: ADLs, housework; started walking initially - none currently    Pertinent Labs:   Results for MAJUSTEEN, HEHRMRN [id number] of 12/25/2016 10:00   Ref. Range 03/17/2016 10:32 07/09/2016 11:57 11/11/2016 11:09   HGB A1C Latest Ref Range: 3.9 - 5.6 % 6.1 (H) 8.0 (H) 7.8 (H)     Pertinent Medications: Metformin, glipizide, reglan, atorvastatin    Nutrition Focused Physical Findings:  Overall appearance: Body habitus: obese female    LMP 05/06/1983    Wt Readings from Last 5 Encounters:   12/22/16 81.5 kg (179 lb 10.8 oz)   12/19/16 81.4 kg (179 lb 7.3 oz)   12/01/16 81.6 kg (179 lb 14.3 oz)   11/18/16 81.5 kg (179 lb 10.8 oz)   10/15/16 81.5 kg (179 lb 10.8 oz)     Ht Readings from Last 1 Encounters:   12/22/16 1.6 m (5' 2.99")     Vitals (Multiple Entries) 07/14/2016   BMI 33.5     Weight Change: sustained, -2 pounds overall   Weight History: stable  Usual Body Weight:  Current weight  Reference Weight:  58kg(165% IBW, adjusted for obesity)     Estimated Nutrition Needs:   1575 kcal/day [place] Jeor x 1.2 (AF)     46-58 gm protein/day 0.8-1 gm protein/kg (based on 58kg)     Estimated Needs  for Weight Loss: ~1300kcals    Nutrition Diagnosis     Nutrition Diagnosis:  Altered nutrition-related laboratory values related to uncontrolled DM2 as evidenced by fasting BG of >275m/dL and HgbA1c=8% (with upward trend over 4 months).  NC-2.2   Status: mild improvement per reports fasting BG of 1880mdL.  Food-and nutrition- related knowledge deficit related to eating for gastroparesis and DM2 as evidenced by RD referral/pt questions.  NB-1.1    Observations:   Pt continues with confusing/challenge of balancing gastroparesis diet with MNT for type 2 DM. Low fiber to support faster transit in conflict with high fiber for improved glycemic control.     Re-inforced use of liquids/shakes when symptoms worsening but concern for sugar load of special K protein drink - suggested homemade shake as alternative   Pt with awareness of what t change, but contemplative at this time   Easily overwhelmed by medical issues   Emphasized 1 small change and tracking for accountability   Verbalizes barriers - travel, care of mother - as reason to put of change.    Nutrition Intervention     Nutrition Intervention:    1) Nutrition Education: Suggested Modificcations.    Pair protein + carb at each meal   Goal 45g carb/meal   Goal 15-20g protein/meal   3 meals/day   Use liquids/shakes when GI symptoms flair - homemade shake (greek yogurt + frozen fruit + vegetables)   Walk 10 minutes, 3 or more times per week; track for progress   Add 2-5 minutes to the walk each week   Walk 2x/day if limited in duration/distance   Add non-starchy vegetables to every dinner - grill    Education needs identified: yes  Handouts provided: AVS  Patient verbalizes understanding of teaching instructions: yes  Barriers to learning assessed: none    Nutrition Monitoring & Evaluation     Nutrition Monitoring and Evaluation:   1) Physical activity:  Duration    Goal: 10 minutes/day, 3x/week  2) Glucose/Endocrine Profile:  HgbA1c:    Goal: <7%  Fasting:   Goal: <15058ml to 80-130m37m  3) Food Choices   Goal: Add non-starchy vegetables to dinner   Goal: trial shake as breakfast - greek yogurt + fruit; pair protein + carb  4) RD follow up in 1 month for accountability    Minutes spent providing assessment and education:45    Report Electronically Signed by:    EvelTheophilus BonesS, RD, IBCLWoodlandger: [phone]

## 2016-12-26 ENCOUNTER — Ambulatory Visit
Admission: RE | Admit: 2016-12-26 | Discharge: 2016-12-26 | Disposition: A | Discharge status: Home / Self Care | Payer: Medicare Other | Source: Ambulatory Visit | Attending: Cardiovascular Disease | Admitting: Cardiovascular Disease

## 2016-12-26 ENCOUNTER — Encounter: Payer: Self-pay | Admitting: Cardiovascular Disease

## 2016-12-26 DIAGNOSIS — R0789 Other chest pain: Secondary | ICD-10-CM

## 2016-12-26 DIAGNOSIS — I209 Angina pectoris, unspecified: Secondary | ICD-10-CM | POA: Insufficient documentation

## 2016-12-26 DIAGNOSIS — I10 Essential (primary) hypertension: Secondary | ICD-10-CM | POA: Insufficient documentation

## 2016-12-26 DIAGNOSIS — R079 Chest pain, unspecified: Secondary | ICD-10-CM

## 2016-12-26 MED ORDER — REGADENOSON 0.4 MG/5 ML INTRAVENOUS SYRINGE
0.4000 mg | INJECTION | Freq: Once | INTRAVENOUS | Status: AC
Start: 2016-12-26 — End: 2016-12-26
  Administered 2016-12-26: 0.4 mg via INTRAVENOUS

## 2016-12-26 MED ORDER — CAFFEINE CITRATE 60 MG/3 ML (20 MG/ML) INTRAVENOUS SOLUTION
60.0000 mg | INTRAVENOUS | Status: DC | PRN
Start: 2016-12-26 — End: 2017-01-01

## 2016-12-27 LAB — ELECTROCARDIOGRAM WITH RHYTHM STRIP: QTC: 498

## 2016-12-29 ENCOUNTER — Other Ambulatory Visit: Payer: Self-pay

## 2016-12-29 NOTE — Progress Notes (Signed)
Ambulatory Case Management   Health Management and Education      This patient has been referred to [facility] per her own request in for assistance in finding a therapist, and resources have been provided. Patient has been following up with medical providers but has not contacted this LCSW for any further assistance with finding a therapist.  Patient has reported barriers of accessing therapy of limited income, medicare, medical issues, and out of state travel to care for her elderly mother. Between 2016 and 2018, more than 20 outreach efforts made to assist her in connecting with therapy per pts request. Although this is a challenging situation, the she also does not appear ready to access therapy. Will notify MD and return to regular follow up care. Patient maybe re-referred if interested in the future.         Electronically signed by   Keturah Barre, [place], CCM  [phone]   Social Work, Case Manager  Health Management and Education  Dept of Ambulatory Case Management

## 2017-01-13 ENCOUNTER — Encounter: Payer: Self-pay | Admitting: "Endocrinology

## 2017-01-13 NOTE — Telephone Encounter (Signed)
Mychart message sent to patient    Doctor [facility] what do you recommend?    Thank you. Please route to P IM TRIAGE pool if further action is needed. Lina Sar, [place]

## 2017-01-22 ENCOUNTER — Ambulatory Visit: Payer: Medicare Other

## 2017-01-25 ENCOUNTER — Other Ambulatory Visit: Payer: Self-pay | Admitting: Neurology

## 2017-01-28 ENCOUNTER — Telehealth: Payer: Self-pay | Admitting: Internal Medicine

## 2017-01-28 ENCOUNTER — Encounter: Payer: Self-pay | Admitting: Family Medicine

## 2017-01-28 ENCOUNTER — Ambulatory Visit: Admission: RE | Admit: 2017-01-28 | Payer: Medicare Other | Source: Ambulatory Visit

## 2017-01-28 MED ORDER — OMEPRAZOLE 20 MG CAPSULE,DELAYED RELEASE
20.0000 mg | DELAYED_RELEASE_CAPSULE | Freq: Two times a day (BID) | ORAL | 5 refills | Status: DC
Start: 2017-01-28 — End: 2017-03-03

## 2017-01-28 NOTE — Telephone Encounter (Signed)
Please note that the patient has rescheduled her appointment on 09/28 to 10/29 with MD Annie Main.  Patient was notified that PET machine is broken and patient has been rescheduled for imaging on 10/25.    Patient would like to relay that her husband and herself "wish the doctor all the best with her upcoming twins."    Thank you,    Ruidoso Downs

## 2017-01-28 NOTE — Telephone Encounter (Signed)
From: [name]  To: [name], MD  Sent: 01/28/2017 3:34 AM PDT  Subject: MyChart Refill Request    Hi Dr, it seems that I need a new Rx for my omeprazole. Can you make that happen? We are now using [facility] on [address] in [place]. Thank you

## 2017-01-28 NOTE — Telephone Encounter (Signed)
Thanks for sending me the message Lenna Sciara!

## 2017-01-30 ENCOUNTER — Telehealth: Payer: Self-pay | Admitting: Family Medicine

## 2017-01-30 ENCOUNTER — Ambulatory Visit: Payer: Medicare Other | Admitting: Internal Medicine

## 2017-01-30 ENCOUNTER — Ambulatory Visit: Payer: Medicare Other | Admitting: GASTROENTEROLOGY

## 2017-01-30 NOTE — Telephone Encounter (Signed)
Received faxed note from [facility] regarding Rx for Omeprazole 20 mg    Note states:  Plan does not cover this medication.  Please initiate prior-authorization.  Message forward to [place].  Phillis Haggis, M.A.

## 2017-02-02 NOTE — Telephone Encounter (Signed)
PA  Approved until 02/02/2022  Pharmacy advised

## 2017-02-16 ENCOUNTER — Encounter: Payer: Self-pay | Admitting: "Endocrinology

## 2017-02-16 DIAGNOSIS — E119 Type 2 diabetes mellitus without complications: Secondary | ICD-10-CM

## 2017-02-16 MED ORDER — INSULIN GLARGINE (U-100) 100 UNIT/ML (3 ML) SUBCUTANEOUS PEN: PEN_INJECTOR | SUBCUTANEOUS | 2 refills | Status: DC

## 2017-02-16 MED ORDER — GLIPIZIDE ER 10 MG TABLET, EXTENDED RELEASE 24 HR
20.0000 mg | EXTENDED_RELEASE_CAPSULE | Freq: Every day | ORAL | 5 refills | Status: DC
Start: 2017-02-16 — End: 2017-10-29

## 2017-02-17 ENCOUNTER — Ambulatory Visit: Payer: Self-pay | Admitting: Family Medicine

## 2017-02-20 ENCOUNTER — Telehealth: Payer: Self-pay | Admitting: "Endocrinology

## 2017-02-20 ENCOUNTER — Encounter: Payer: Self-pay | Admitting: "Endocrinology

## 2017-02-20 ENCOUNTER — Encounter: Payer: Self-pay | Admitting: Family Medicine

## 2017-02-20 ENCOUNTER — Ambulatory Visit: Payer: Medicare Other | Admitting: GASTROENTEROLOGY

## 2017-02-20 NOTE — Telephone Encounter (Signed)
Patient called in, HIPAA/3x identifiers verified. (Name, DOB, address, and/or last 4 of SSN)    Patient states [facility] will not refill her prescription for Insulin states they are waiting clarification from Dr. Rosary Lively they faxed over and have not heard back from her. Patient states she will be out of insulin on 10/22. Please advise.

## 2017-02-20 NOTE — Telephone Encounter (Signed)
I had not received any fax from [facility]. Called and spoke to [facility] now and clarified the prescription for Lantus 22 units QHS. The pharmacy will fill the prescription and I will send a mychart message to the patient now to notify her.     Sunday Spillers, M.D.  Clinical Assistant Professor  Department of Endocrinology  Pager # [phone]

## 2017-02-25 ENCOUNTER — Telehealth: Payer: Self-pay | Admitting: "Endocrinology

## 2017-02-25 NOTE — Telephone Encounter (Signed)
Received a fax asking for a prescription for:    Lantus Solostar or [employer], agricultural     Per fax patient was prescribed: Insulin Glargine (U-100) 3ML Sub Q pen 90 day supply. However they're stating this isn't a valid prescription and [employer] not interchangeable. The fax had a typed prescription.     Thank you,  Preston Fleeting, MA

## 2017-02-25 NOTE — Telephone Encounter (Signed)
I called pharmacy last week and clarified that prescription should be for Lantus solostar insulin, 90 day supply.    Thank you,  Sunday Spillers, M.D.  Clinical Assistant Professor  Department of Endocrinology  Pager # [phone]

## 2017-02-25 NOTE — Telephone Encounter (Signed)
Hi Dr Rosary Lively,  Thank you I wanted to make sure its done.     Preston Fleeting, MA

## 2017-02-25 NOTE — Telephone Encounter (Signed)
Thanks, Lattie Haw! I just called [facility] now to confirm and all is set. They have the prescription as Lantus.     Sunday Spillers

## 2017-02-26 ENCOUNTER — Inpatient Hospital Stay: Admit: 2017-02-26 | Payer: Self-pay

## 2017-02-27 ENCOUNTER — Ambulatory Visit
Admission: RE | Admit: 2017-02-27 | Discharge: 2017-02-27 | Disposition: A | Discharge status: Home / Self Care | Payer: Medicare Other | Source: Ambulatory Visit | Attending: Diagnostic Radiology | Admitting: Diagnostic Radiology

## 2017-02-27 DIAGNOSIS — C7A8 Other malignant neuroendocrine tumors: Secondary | ICD-10-CM | POA: Insufficient documentation

## 2017-02-27 HISTORY — DX: Fatty (change of) liver, not elsewhere classified: K76.0

## 2017-02-27 HISTORY — DX: Hepatic fibrosis: K74.0

## 2017-02-27 HISTORY — DX: Hepatic fibrosis, unspecified: K74.00

## 2017-03-02 ENCOUNTER — Encounter: Payer: Self-pay | Admitting: Hematology & Oncology

## 2017-03-02 ENCOUNTER — Ambulatory Visit: Payer: Medicare Other | Attending: Hematology & Oncology | Admitting: Hematology & Oncology

## 2017-03-02 ENCOUNTER — Ambulatory Visit (INDEPENDENT_AMBULATORY_CARE_PROVIDER_SITE_OTHER): Payer: Medicare Other

## 2017-03-02 ENCOUNTER — Other Ambulatory Visit: Payer: Self-pay | Admitting: GASTROENTEROLOGY

## 2017-03-02 VITALS — BP 134/79 | HR 74 | Temp 97.6°F | Resp 16 | Ht 63.78 in | Wt 176.4 lb

## 2017-03-02 DIAGNOSIS — C7A8 Other malignant neuroendocrine tumors: Secondary | ICD-10-CM

## 2017-03-02 DIAGNOSIS — E279 Disorder of adrenal gland, unspecified: Secondary | ICD-10-CM | POA: Insufficient documentation

## 2017-03-02 DIAGNOSIS — D3502 Benign neoplasm of left adrenal gland: Secondary | ICD-10-CM | POA: Insufficient documentation

## 2017-03-02 DIAGNOSIS — D3A00 Benign carcinoid tumor of unspecified site: Secondary | ICD-10-CM

## 2017-03-02 DIAGNOSIS — E278 Other specified disorders of adrenal gland: Secondary | ICD-10-CM

## 2017-03-02 DIAGNOSIS — E119 Type 2 diabetes mellitus without complications: Secondary | ICD-10-CM | POA: Insufficient documentation

## 2017-03-02 DIAGNOSIS — C7A010 Malignant carcinoid tumor of the duodenum: Secondary | ICD-10-CM

## 2017-03-02 DIAGNOSIS — R109 Unspecified abdominal pain: Secondary | ICD-10-CM

## 2017-03-02 DIAGNOSIS — E669 Obesity, unspecified: Secondary | ICD-10-CM | POA: Insufficient documentation

## 2017-03-02 DIAGNOSIS — Z9889 Other specified postprocedural states: Secondary | ICD-10-CM | POA: Insufficient documentation

## 2017-03-02 DIAGNOSIS — R110 Nausea: Secondary | ICD-10-CM | POA: Insufficient documentation

## 2017-03-02 DIAGNOSIS — K760 Fatty (change of) liver, not elsewhere classified: Secondary | ICD-10-CM

## 2017-03-02 LAB — COMPREHENSIVE METABOLIC PANEL
Alanine Transferase (ALT): 47 U/L (ref 5–54)
Albumin: 3.8 g/dL (ref 3.2–4.6)
Alkaline Phosphatase (ALP): 123 U/L — ABNORMAL HIGH (ref 35–115)
Aspartate Transaminase (AST): 47 U/L — ABNORMAL HIGH (ref 15–43)
Bilirubin Total: 1 mg/dL (ref 0.3–1.3)
Calcium: 9.2 mg/dL (ref 8.6–10.5)
Carbon Dioxide Total: 24 mmol/L (ref 24–32)
Chloride: 103 mmol/L (ref 95–110)
Creatinine Serum: 1.05 mg/dL (ref 0.44–1.27)
E-GFR Creatinine (Male): 54 mL/min/{1.73_m2}
E-GFR, African American (Male): >60 mL/min/{1.73_m2}
Glucose: 137 mg/dL — ABNORMAL HIGH (ref 70–99)
Potassium: 4.1 mmol/L (ref 3.3–5.0)
Protein: 7.7 g/dL (ref 6.3–8.3)
Sodium: 139 mmol/L (ref 135–145)
Urea Nitrogen, Blood (BUN): 14 mg/dL (ref 8–22)

## 2017-03-02 LAB — CBC WITH DIFFERENTIAL
Basophils % Auto: 1.5 %
Basophils Abs Auto: 0.1 10*3/uL (ref 0.0–0.2)
Eosinophils % Auto: 1.6 %
Eosinophils Abs Auto: 0.1 10*3/uL (ref 0.0–0.5)
Hematocrit: 38.4 % (ref 36.0–46.0)
Hemoglobin: 12.2 g/dL (ref 12.0–16.0)
Lymphocytes % Auto: 24.4 %
Lymphocytes Abs Auto: 2 10*3/uL (ref 1.0–4.8)
MCH: 25.3 pg — ABNORMAL LOW (ref 27.0–33.0)
MCHC: 31.9 % — ABNORMAL LOW (ref 32.0–36.0)
MCV: 79.4 UM3 — ABNORMAL LOW (ref 80.0–100.0)
MPV: 8.4 UM3 (ref 6.8–10.0)
Monocytes % Auto: 6.6 %
Monocytes Abs Auto: 0.6 10*3/uL (ref 0.1–0.8)
Neutrophils % Auto: 65.9 %
Neutrophils Abs Auto: 5.5 10*3/uL (ref 1.8–7.7)
Platelet Count: 239 10*3/uL (ref 130–400)
RDW: 16.4 % — ABNORMAL HIGH (ref 0.0–14.7)
Red Blood Cell Count: 4.84 10*6/uL (ref 4.00–5.20)
White Blood Cell Count: 8.4 10*3/uL (ref 4.5–11.0)

## 2017-03-02 NOTE — Nursing Note (Signed)
Verified patient with two identifiers, vitals taken screen for pain, allergies verified and pharmacy verified.    Jatavious Peppard MA I

## 2017-03-02 NOTE — Progress Notes (Signed)
Yes, we will update her.    Thanks again,  [employer]

## 2017-03-02 NOTE — Progress Notes (Signed)
HEMATOLOGY AND ONCOLOGY CLINIC NOTE    REASONS FOR FOLLOW-UP: well differentiated neuroendocrine tumor of duodenum, localized     HEMATOLOGIC/ONCOLOGIC HISTORY  Patient with multiple medical problems including type II diabetes, obesity and NAFLDwho presented with chronic intermittent nausea with vomiting and abdominal pain for two years. Been seen by hepatologist Paula Daugherty. Abdominal CT on 12/28/15 is unrevealing. She underwent EGD on 02/12/2016 which revealed a 1-1.5 cm polyp in the second part of the duodenum. Other findings include mild antral gastritis and no evidence of varices. The duodenal polyp was transected by hot snare. Pathology is consistent with grade 1 well differentiated neuroendocrine tumor with positive synaptophysin. Ki67 <2%. Her post-EGD course was complicated by post polypectomy bleeding which required an additional hemoclip at [facility].  Paula Daugherty PET/CT scan on 02/28/16 reveals no evidence of residual or metastatic disease.  Normal gastrin level at 58 as of 02/29/16.    DISEASE STATUS: localized disease    SUMMARY OF HEMATOLOGIC/ONCOLOGIC THERAPY, RESPONSE & SIDE EFFECTS  1. S/p polypectomy 02/12/16   -patient was found to have a 1-1.5 cm polyp in the second part of the duodenum during EGD for the workup of nausea and vomiting and abdominal pain.  The polyp was transected by hot snare   -pathology revealed grade 1 well differentiated neuroendocrine tumor [id number] <2%   -HB71 Daugherty PET/CT scan on 02/28/16 reveals no evidence of residual or metastatic disease   -normal gastrin level at 58 as of 02/29/16    2. Surveillance (present)   -repeat EGD 06/10/16 revealed the site of prior polypectomy.  A small bumpy raised area was noted at the base of the the hemoclip and marked with spot tattoo.  Antral gastritis was also noted   -pathology showed only duodenal mucosa with reactive changes with no neuroendocrine tumor present.  The stomach biopsy showed mild chronic  antral/oxyntic gastritis without activity and no H pylori was found    Others:  Gastroparesis    ECOG PERFORMANCE STATUS  [name]    INTERVAL SUBJECTIVE HISTORY  Pt presents with her husband for surveillance and imaging review, covering for Paula Daugherty, while she is on maternity leave.  Since the last clinic visit, Paula Daugherty has been doing generally well.  No new complaints.  She continues to suffer from stable symptoms of nausea with rare vomiting. The nausea can occur anytime during the day or night and is not associated with eating.  She is currently taking zofran which somewhat helps the symptom.  On average she requires zofran 1-2 doses daily for three days a week.  Other symptoms include abdominal pain.  She describes the pain as intermittent generalized pain.  Again the pain is not associated with BM or eating.  The pain can come with nausea sometimes, and by itself at the other times.  The pain lasts for several hours before it resolves spontaneously. Currently not taking any pain med.  Bowel pattern is alternating between constipation and diarrhea which is her norm.  She underwent a gastric emptying test at the end of Feb as she is clinically suspected to have gastroparesis.    In in the interim, she underwent PET/CT GA68 Daugherty scan:  02/27/2017  IMPRESSION:  1. No definite focus of uptake suspicious for tumor.   2. There are scattered, nonspecific areas of radiotracer uptake within  the second and third portions of the duodenum as described above, without  CT correlate. This finding is nonspecific, tumor cannot  be entirely  excluded. However, attention on follow-up is recommended. Please note in  the clip is no longer visualized in the current study.  3. Probable left adrenal adenoma. If there is clinical concern  dedicated MRI or CT with adrenal protocol is recommended.    Preliminary Report Electronically Signed By: Paula Daugherty on  02/27/2017 4:19  PM    Full reports and images of radiology study above were reviewed by me and in detail with patient today.    - ROS otherwise negative except that noted above    PAST MEDICAL HISTORY  Patient Active Problem List   Diagnosis    DM2 (diabetes mellitus, type 2)    Depression with anxiety    Stress at home    Leg cramps-right calf    Right ear pain    Fibromyalgia    HTN (hypertension)    GERD (gastroesophageal reflux disease)    Hyperlipidemia with target LDL less than 70    Vitamin D insufficiency    Abnormal LFTs    Renal cyst ( 6.4 cm cyst left kidney)    Cough    Type 2 diabetes mellitus without complication    Fatty liver    Macroalbuminuric diabetic nephropathy    History of actinic keratoses    Cataract    Ptosis of eyelid    Liver fibrosis    Adrenal nodule    Medication Therapy Auth    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    Mixed conductive and sensorineural hearing loss of both ears    Neoplasm of uncertain behavior of skin       Past Medical History:  No date: Asthma  No date: Cirrhosis ([place])  No date: Diabetes mellitus ([place])  No date: Fatty liver  No date: Hypertension  No date: Kidney disease  No date: Liver fibrosis  05/20/2016: Neoplasm of uncertain behavior of skin  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[place])  No date: Psychiatric illness    ALLERGIES  Contrast dye [radiopaque agent]; Hydrochlorothiazide; Januvia [sitagliptin]; Lyrica [pregabalin]; Omnipaque [iohexol]; Prozac [fluoxetine hcl]; Sulfa (sulfonamide antibiotics); and Topiramate    CURRENT MEDICATIONS    Current Outpatient Medications:     Amlodipine (NORVASC) 10 mg Tablet, Take 1 tablet by mouth every day., Disp: 90 tablet, Rfl: 3    Atorvastatin (LIPITOR) 10 mg Tablet, Take 1 tablet by mouth every day at bedtime., Disp: 90 tablet, Rfl: 3    Blood Sugar Diagnostic (ONETOUCH ULTRA TEST) Strips, Use to test blood glucose 3x/day, Disp: 300 strip, Rfl: 11    empagliflozin (JARDIANCE) 25  mg Tablet, Take 1 tablet by mouth every day., Disp: 90 tablet, Rfl: 3    FLUTICASONE/SALMETEROL (ADVAIR DISKUS INHA), Take  by inhalation., Disp: , Rfl:     GlipiZIDE (GLUCOTROL XL) 10 mg SR Tablet, Take 2 tablets by mouth every morning with a meal. (diabetes), Disp: 60 tablet, Rfl: 5    Insulin Glargine (LANTUS SOLOSTAR U-100 INSULIN) 100 unit/mL (3 mL) Pen, Inject 22 units every night before bed. Please supply 90 day supply., Disp: 30 mL, Rfl: 2    Insulin Pen Needles, Disposable, (BD ULTRA-FINE III SHORT PEN NEEDLE) 31 gauge x 5/16", Use for injecting insulin 1 time per day, Disp: 100 each, Rfl: 11    Losartan (COZAAR) 25 mg Tablet, Take 1 tablet by mouth every day., Disp: 90 tablet, Rfl: 3    Metformin (GLUCOPHAGE) 1,000 mg tablet, Take 1 tablet by mouth 2 times daily with meals. (diabetes), Disp:  180 tablet, Rfl: 3    Metoclopramide (REGLAN) 10 mg Tablet, TAKE 1 TABLET BY MOUTH 4 TIMES DAILY. [place], Disp: 360 tablet, Rfl: 0    Omeprazole (PRILOSEC) 20 mg Delayed Release Capsule, Take 1 capsule by mouth two times daily before meals., Disp: 60 capsule, Rfl: 5    Ondansetron (ZOFRAN, AS HYDROCHLORIDE,) 8 mg Tablet, Take 1 tablet by mouth every 8 hours if needed., Disp: 270 tablet, Rfl: 3    PEG 3350-Electrolytes-Vit C (MOVIPREP) 100-7.5-2.691 gram Powder in Packet, Take as directed by GI instruction sheet, Disp: 1 packet, Rfl: 0    Triamcinolone (KENALOG) 0.1 % Cream, Apply to the affected area 2 times daily. Use twice a day for two weeks on the chest lesion., Disp: 30 g, Rfl: 1    Venlafaxine ([name]) 75 mg Tablet, Take 2 tabs by mouth every morning and 1 tab every evening., Disp: 90 tablet, Rfl: 11        >>>PHYSICAL EXAMINATION  Temp: 36.4 C (97.6 F) (10/29 0842)  Temp src: Temporal (10/29 0842)  Pulse: 74 (10/29 0842)  BP: 134/79 (10/29 0842)  Resp: 16 (10/29 0842)  SpO2: 96 % (10/29 0842)  Height: 162 cm (5' 3.78") (10/29 1478)  Weight: 80 kg (176 lb 5.9 oz) (10/29  0842)    General Appearance: healthy, alert, no distress, pleasant affect, cooperative.  Eyes:  conjunctivae and corneas clear. PERRL, EOM's intact. sclerae normal.  Mouth: normal.  Neck:  Neck supple.   Heart:  normal rate and regular rhythm, no murmurs, clicks, or gallops.  Lungs: clear to auscultation.  Abdomen: Obese, soft, non-tender.  No masses or organomegaly.  Neuro: Gait normal. Strength grossly normal.  Mental Status: interacts appropriately, good eye contact.     PATHOLOGY   EMR reviewed    LABORATORY    Patient Active Problem List   WHITE BLOOD CELL COUNT (K/MM3)   Date Value   08/30/2015 8.9     White Blood Cell Count (K/MM3)   Date Value   11/11/2016 8.7     HEMOGLOBIN (g/dL)   Date Value   08/30/2015 14.4     Hemoglobin (g/dL)   Date Value   11/11/2016 13.1     MCV (UM3)   Date Value   11/11/2016 80.5   08/30/2015 83.6     MCHC (%)   Date Value   11/11/2016 32.4   08/30/2015 32.3     RDW   Date Value   11/11/2016 15.2 %   08/30/2015 15.6 UNITS     PLATELET COUNT (K/MM3)   Date Value   08/30/2015 217     Platelet Count (K/MM3)   Date Value   11/11/2016 271     SODIUM (mEq/L)   Date Value   08/30/2015 142     Sodium (mmol/L)   Date Value   11/11/2016 138     POTASSIUM (mEq/L)   Date Value   08/30/2015 4.0     Potassium (mmol/L)   Date Value   11/11/2016 3.8     CHLORIDE (mEq/L)   Date Value   08/30/2015 104     Chloride (mmol/L)   Date Value   11/11/2016 101     CARBON DIOXIDE TOTAL (mEq/L)   Date Value   08/30/2015 22     Carbon Dioxide Total (mmol/L)   Date Value   11/11/2016 26     CREATININE BLOOD (mg/dL)   Date Value   08/30/2015 0.88  Creatinine Serum (mg/dL)   Date Value   11/11/2016 1.05     UREA NITROGEN, BLOOD (BUN) (mg/dL)   Date Value   08/30/2015 19     Urea Nitrogen, Blood (BUN) (mg/dL)   Date Value   11/11/2016 12       >>>IMPRESSION  1. 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 Gastroparesis  3.    Nausea and abdominal pain  4. Left adrenal gland adenoma, stable  5. 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 Duodenal neuroendocrine tumors are relatively rare and comprise only2-3% of all GI endocrine tumors. The majority of duodenal NET are nonfunctional and indolent with benign progression. It sometimes could cause Zollinger- syndrome and other clinical hormonal syndromes such as Cushing. Patient's normal gastrin level ruled out ZES.      Patient has localized disease, and complete resection of the tumor would be the primary treatment.  Patient underwent incomplete resection via polypectomy in Oct 2017.  There is currently no radiographic or pathologic evidence of disease.  GSeth.ContesDOTATATE PET/CT s/p polypectomy in 2017 reveals no evidence of residual or metastatic disease.  Repeat EGD/EUS revealed a small bumpy raised area at the base of the the hemoclip but pathology showed only duodenal mucosa with reactive changes without any neuroendocrine tumor present.  Will repeat Daugherty PET scan and send for 5-HIAA.  If both are negative, will continue surveillance with routine endoscopic surveillance and consider biochemical marker and scan as clinically indicated.  Note patient also has diabetes and gastroparesis which may be contributing to her GI symptoms instead.  Patient and family verbalized understanding of the plan and agree to proceed.    Daugherty scan now shows nonspecific uptake in the duodenum with no CT correlate.  Will contact GI re need for earlier surveillance EGD to visualize.    Regarding 1.4cm x 1.7cm left adrenal incidentaloma, it is noted to have features of a benign cortical adenoma.  No  signs of functional adrenal tumor. For incidentalomas with a benign appearance on imaging, will plan for repeat imaging study at 6 to 12 months after initial discovery. The rationale is that many malignant lesions will grow in this interval, leading to earlier intervention. Will likely obtain CT for comparison to current.      REVIEW OF RECOMMENDATIONS  1. Diagnostics:   -labs today: CBCdiff, CMP, 5HIAA   -repeat Daugherty scan as clinically indicated   -routine scope surveillance per GI- will contact Dr. CVirgel Daugherty  -routine colonoscopy               -plan follow up imaging for adrenal incidentaloma with CT protocol in April 2019    2. Therapy:        -surveillance as above    3. Followup:       -in 2 months with repeat scan    Side effects of above therapy were discussed at length and patient acknowledged risks and benefits associated with treatment.      45 minutes were spent on this case. Greater than 50% time was spent in face to face encounter with the patient counseling on diagnosis and management. Remainder of time spent included in comprehensive review of the  patient's chart, literature search, and continuity of care needs including referrals.    The plan was reviewed with the patient/caregiver, who understands and is in agreement. She/he is agreeable with plan of care and would like to proceed. All her/his questions were answered to her/his satisfaction. [place] interpreter was not used.    Dellie Burns, MD  Associate Physician  Division of Hematology/Oncology  [employer]. [phone]

## 2017-03-03 ENCOUNTER — Other Ambulatory Visit: Payer: Self-pay | Admitting: Family Medicine

## 2017-03-03 ENCOUNTER — Ambulatory Visit: Payer: Medicare Other | Admitting: Family Medicine

## 2017-03-03 ENCOUNTER — Encounter: Payer: Self-pay | Admitting: Family Medicine

## 2017-03-03 VITALS — BP 120/60 | HR 80 | Resp 16 | Wt 175.3 lb

## 2017-03-03 DIAGNOSIS — C7A8 Other malignant neuroendocrine tumors: Secondary | ICD-10-CM

## 2017-03-03 DIAGNOSIS — E119 Type 2 diabetes mellitus without complications: Secondary | ICD-10-CM

## 2017-03-03 DIAGNOSIS — R718 Other abnormality of red blood cells: Secondary | ICD-10-CM

## 2017-03-03 NOTE — Nursing Note (Signed)
Patient roomed, chief complaint noted, allergies verified, blood pressure, pulse, respiration, and weight obtained, screened for pain, and pharmacy verified.  Rhodesia Stanger, M.A.

## 2017-03-03 NOTE — Patient Instructions (Addendum)
A laboratory test has been ordered for you.  No fasting or appointment needed.       Please see about possibly getting Shinrix from outside pharmacy--repeat due after 2 mos with 1 booster to complete series of 2.

## 2017-03-03 NOTE — Telephone Encounter (Signed)
Last office visit: 11-18-16 with PCP    Please advise.  Paula Daugherty, [place]

## 2017-03-03 NOTE — Progress Notes (Signed)
Paula Daugherty is a 75yrfemale who presents with a chief complaint of "here for follow-up".    Chief Complaint   Patient presents with    Diabetes       1. Tumor of duodenum: recent Pet scan reviewed with patient and spouse; patient to follow-up with DR Chak   2. DM2: A1c not done with recent labs; unsure for reason why this was not done   3. Depression and anxiety: [place]; dementia creating more stress for everyone involved; no SI or HI       ROS:   .Constitutional: no fever/chills.  CV: no chest pain, palpitations, shortness of breath or edema  No current abd, N/V       Past Medical History:   Diagnosis Date    Asthma     Cirrhosis ([place]     Diabetes mellitus ([place]     Fatty liver     Hypertension     Kidney disease     Liver fibrosis     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/20/2016    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[place] 04/17/2016    Psychiatric illness        Current Outpatient Medications on File Prior to Visit   Medication Sig Dispense Refill    Amlodipine (NORVASC) 10 mg Tablet Take 1 tablet by mouth every day. 90 tablet 3    Atorvastatin (LIPITOR) 10 mg Tablet Take 1 tablet by mouth every day at bedtime. 90 tablet 3    Blood Sugar Diagnostic (ONETOUCH ULTRA TEST) Strips Use to test blood glucose 3x/day 300 strip 11    empagliflozin (JARDIANCE) 25 mg Tablet Take 1 tablet by mouth every day. 90 tablet 3    FLUTICASONE/SALMETEROL (ADVAIR DISKUS INHA) Take  by inhalation.      GlipiZIDE (GLUCOTROL XL) 10 mg SR Tablet Take 2 tablets by mouth every morning with a meal. (diabetes) 60 tablet 5    Insulin Glargine (LANTUS SOLOSTAR U-100 INSULIN) 100 unit/mL (3 mL) Pen Inject 22 units every night before bed. Please supply 90 day supply. 30 mL 2    Insulin Pen Needles, Disposable, (BD ULTRA-FINE III SHORT PEN NEEDLE) 31 gauge x 5/16" Use for injecting insulin 1 time per day 100 each 11    Losartan (COZAAR) 25 mg Tablet Take 1 tablet by mouth every day. 90  tablet 3    Metformin (GLUCOPHAGE) 1,000 mg tablet Take 1 tablet by mouth 2 times daily with meals. (diabetes) 180 tablet 3    Metoclopramide (REGLAN) 10 mg Tablet TAKE 1 TABLET BY MOUTH 4 TIMES DAILY. 30 MINUTES BEFORE EACH MEAL 360 tablet 0    Omeprazole (PRILOSEC) 20 mg Delayed Release Capsule Take 1 capsule by mouth two times daily before meals. 60 capsule 5    Ondansetron (ZOFRAN, AS HYDROCHLORIDE,) 8 mg Tablet Take 1 tablet by mouth every 8 hours if needed. 270 tablet 3    PEG 3350-Electrolytes-Vit C (MOVIPREP) 100-7.5-2.691 gram Powder in Packet Take as directed by GI instruction sheet 1 packet 0    Triamcinolone (KENALOG) 0.1 % Cream Apply to the affected area 2 times daily. Use twice a day for two weeks on the chest lesion. 30 g 1    Venlafaxine ([name]) 75 mg Tablet Take 2 tabs by mouth every morning and 1 tab every evening. 90 tablet 11     No current facility-administered medications on file prior to visit.      Allergies:   Allergies  Allergen Reactions    Contrast Dye [Radiopaque Agent] Hives    Hydrochlorothiazide Other-Reaction in Comments     Patient reports    Januvia [Sitagliptin] Other-Reaction in Comments     Patient reports    Lyrica [Pregabalin] Other-Reaction in Comments     Patient reports    Omnipaque [Iohexol] Other-Reaction in Comments     Patient reports    Prozac [Fluoxetine Hcl] Other-Reaction in Comments     Patient reports    Sulfa (Sulfonamide Antibiotics) Rash    Topiramate Other-Reaction in Comments     Hairfall       Social History     Socioeconomic History    Marital status: MARRIED     Spouse name: Not on file    Number of children: 1    Years of education: Not on file    Highest education level: Not on file   Social Needs    Financial resource strain: Not on file    Food insecurity - worry: Not on file    Food insecurity - inability: Not on file    Transportation needs - medical: Not on file    Transportation needs - non-medical: Not on file    Occupational History    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 [employer]    Tobacco Use    Smoking status: Former Smoker     Packs/day: 0.10     Years: 20.00     Pack years: 2.00    Smokeless tobacco: Former Systems developer    Tobacco comment: quit 30 years ago   Substance and Sexual Activity    Alcohol use: Yes     Alcohol/week: 0.0 oz     Comment: rare shares a beer or glass wine every 2-3 months     Drug use: No    Sexual activity: Yes     Partners: Male   Other Topics Concern    Not on file   Social History Narrative    Not on file     Family History   Problem Relation Name Age of Onset    Diabetes Father      Heart Mother          MI at 6s     Non-contributory Brother      Non-contributory Brother         PE:  BP 120/60 (SITE: right arm, Orthostatic Position: sitting, Cuff Size: regular)   Pulse 80   Resp 16   Wt 79.5 kg (175 lb 4.3 oz)   LMP 05/06/1983   BMI 30.29 kg/m   .General Appearance: healthy, alert, no distress, pleasant affect, cooperative.  Heart:  normal rate and regular rhythm, no murmurs, clicks, or gallops.  Lungs: clear to auscultation.  Extremities:  no cyanosis, clubbing, or edema.  Mental Status: appropriate affect, behavior and mentation  Musculoskeletal: feet without lesions bilaterally and intact sensation     Assessment and Plan:  (C7A.8) 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[place])  (primary encounter diagnosis)  Comment: recent pet scan reviewed with patient   Plan: routing note to DR Virgel Bouquet as FYI     (R71.8) Low mean corpuscular volume (MCV)  Comment: without anemia  Plan: IRON TOTAL, FERRITIN, TRANSFERRIN, RETICULOCYTE        STUDIES, FOLATE          (E11.9) Type 2 diabetes mellitus without complication, without long-term current use of insulin (HCC)  Comment: A1c due  Plan: CREATININE SPOT URINE, MICROALBUMIN, URINALYSIS  AND CULTURE IF IND              Total encounter time including history, physical examination, and coordination of care was approximately 20  minutes of which more than 50% was spent counseling regarding assessment/diagnosis and treatment plan. No guarantees were made regarding her medical care or treatment outcome. Barriers to Learning: none.  Patient verbalizes understanding of teaching and instructions.    Electronically signed by:    [name], MD  [place], [place] Board of Family Medicine  Associate physician [facility] [place], [place]   [phone]

## 2017-03-04 ENCOUNTER — Other Ambulatory Visit: Payer: Self-pay | Admitting: "Endocrinology

## 2017-03-04 DIAGNOSIS — E1121 Type 2 diabetes mellitus with diabetic nephropathy: Secondary | ICD-10-CM

## 2017-03-04 MED ORDER — EMPAGLIFLOZIN 25 MG TABLET
25.0000 mg | ORAL_TABLET | Freq: Every day | ORAL | 3 refills | Status: DC
Start: 2017-03-04 — End: 2018-01-19

## 2017-03-06 ENCOUNTER — Ambulatory Visit: Payer: Medicare Other | Attending: Family Medicine

## 2017-03-06 DIAGNOSIS — R718 Other abnormality of red blood cells: Secondary | ICD-10-CM

## 2017-03-06 DIAGNOSIS — E119 Type 2 diabetes mellitus without complications: Secondary | ICD-10-CM

## 2017-03-06 DIAGNOSIS — E279 Disorder of adrenal gland, unspecified: Secondary | ICD-10-CM | POA: Insufficient documentation

## 2017-03-06 DIAGNOSIS — E278 Other specified disorders of adrenal gland: Secondary | ICD-10-CM

## 2017-03-06 LAB — RETICULOCYTE STUDIES
Corrected Retic Count: 0.8 % (ref 0.4–1.5)
Hematocrit: 40.2 % (ref 36.0–46.0)
Red Blood Cell Count: 5.14 10*6/uL (ref 4.00–5.20)
Reticulocyte Count %: 0.9 % (ref 0.4–2.4)
Reticulocyte Count Abs: 46.3 10*3/uL (ref 18.5–82.5)

## 2017-03-06 LAB — MICROALBUMIN
Creatinine Spot Urine: 113 mg/dL
Microalbumin Urine: 14 mg/dL
Microalbumin/Creatinine Ratio: 124 mg/g — ABNORMAL HIGH (ref <30)

## 2017-03-06 LAB — URINALYSIS AND CULTURE IF IND
Bilirubin Urine: NEGATIVE
Glucose Urine: >=500 mg/dL — AB
Hyaline Casts: 1 /LPF (ref ?–5)
Ketones: NEGATIVE mg/dL
Nitrite Urine: NEGATIVE
Occult Blood Urine: NEGATIVE mg/dL
Protein Urine: 30 mg/dL — AB
RBC: 7 /HPF — ABNORMAL HIGH (ref 0–5)
Specific Gravity, Urine: 1.029 (ref 1.002–1.030)
Urobilinogen: NEGATIVE mg/dL (ref ?–2.0)
WBC, Urine: 15 /HPF — ABNORMAL HIGH (ref 0–5)
pH URINE: 5 (ref 4.8–7.8)

## 2017-03-06 LAB — COMPREHENSIVE METABOLIC PANEL
Alanine Transferase (ALT): 44 U/L (ref 5–54)
Albumin: 4 g/dL (ref 3.2–4.6)
Alkaline Phosphatase (ALP): 130 U/L — ABNORMAL HIGH (ref 35–115)
Aspartate Transaminase (AST): 47 U/L — ABNORMAL HIGH (ref 15–43)
Bilirubin Total: 0.9 mg/dL (ref 0.3–1.3)
Calcium: 9.5 mg/dL (ref 8.6–10.5)
Carbon Dioxide Total: 26 mmol/L (ref 24–32)
Chloride: 99 mmol/L (ref 95–110)
Creatinine Serum: 0.94 mg/dL (ref 0.44–1.27)
E-GFR Creatinine (Male): >60 mL/min/{1.73_m2}
E-GFR, African American (Male): >60 mL/min/{1.73_m2}
Glucose: 131 mg/dL — ABNORMAL HIGH (ref 70–99)
Potassium: 3.7 mmol/L (ref 3.3–5.0)
Protein: 8.3 g/dL (ref 6.3–8.3)
Sodium: 140 mmol/L (ref 135–145)
Urea Nitrogen, Blood (BUN): 15 mg/dL (ref 8–22)

## 2017-03-06 LAB — TRANSFERRIN
Iron Percent Saturation: 19.4 % (ref 15.0–50.0)
Iron Total: 73 ug/dL (ref 42–135)
Total Iron Binding Capacity: 377 µg/mL (ref 280–400)
Transferrin: 271 mg/dL (ref 192–382)

## 2017-03-06 LAB — HEMOGLOBIN A1C
Hgb A1C,Glucose Est Avg: 151 mg/dL
Hgb A1C: 6.9 % — ABNORMAL HIGH (ref 3.9–5.6)

## 2017-03-06 LAB — FERRITIN: Ferritin: 11 ng/mL (ref 10–291)

## 2017-03-06 LAB — IRON TOTAL: Iron Total: 73 ug/dL (ref 42–135)

## 2017-03-06 LAB — CREATININE SPOT URINE: Creatinine Spot Urine: 113 mg/dL

## 2017-03-07 LAB — FOLATE: Folate: 9 ng/mL (ref >=7.0)

## 2017-03-07 LAB — VITAMIN B12: Vitamin B12: 313 pg/mL (ref 213–816)

## 2017-03-08 ENCOUNTER — Encounter: Payer: Self-pay | Admitting: Family Medicine

## 2017-03-08 DIAGNOSIS — R3129 Other microscopic hematuria: Secondary | ICD-10-CM

## 2017-03-08 DIAGNOSIS — R3121 Asymptomatic microscopic hematuria: Secondary | ICD-10-CM | POA: Insufficient documentation

## 2017-03-08 LAB — CULTURE URINE, BACTI: URINE CULTURE: NO GROWTH

## 2017-03-08 NOTE — Telephone Encounter (Signed)
From: [name]  To: [name], MD  Sent: 03/06/2017 9:36 PM PDT  Subject: Non-urgent Medical Advice Question    I have read the test results. They don't look so great. No I am not having any symptoms at this time. I look forward to hearing next steps. I want to get ahold of anything I need before we leave. Thanks doc.

## 2017-03-11 ENCOUNTER — Ambulatory Visit: Payer: Medicare Other | Attending: UROLOGY | Admitting: UROLOGY

## 2017-03-11 VITALS — BP 134/76 | HR 75 | Temp 98.5°F | Resp 18

## 2017-03-11 DIAGNOSIS — R3129 Other microscopic hematuria: Secondary | ICD-10-CM | POA: Insufficient documentation

## 2017-03-11 LAB — 5-HYDROXYINDOLACETIC ACID SENDOUT
5-HIAA URINE - PER 24HR: 5 mg/d (ref 0–15)
5-HIAA URINE - PER VOLUME: 3 mg/L
5-HIAA URINE - RATIO TO CRT: 5 mg/gCR (ref 0–14)
CREATININE,UR PER 24HR: 902 mg/d (ref 500–1400)
CREATININE,UR PER VOLUME: 55 mg/dL
TIME OF COLLECTION: 24 hr
TOTAL VOLUME: 1640 mL

## 2017-03-11 LAB — POC URINALYSIS-DIPSTICK
POC BILIRUBIN URINE: NEGATIVE
POC GLUCOSE URINE: 1 mg/dL — AB
POC NITRITE URINE: NEGATIVE
POC PH URINALYSIS: 5.5 pH (ref 4.8–7.8)
POC PROTEIN URINE: 30 — AB
POC SP GRAVITY: 1.015 (ref 1.002–1.030)
POC UROBILINOGEN: 0.2 EU/dL

## 2017-03-11 NOTE — Progress Notes (Signed)
CYSTOSCOPIC EXAMINATION: After informed consent, the patient was placed on the examining table in the dorsal lithotomy position and prepped and draped in the usual manner. 10 cc of Xylocaine jelly were used to anesthetize the urethra.     Flexible cystoscope was used to visualize the urethra and the bladder.    CYSTOSCOPIC FINDINGS: Normal bladder, normal urethra, no tumors     Electronically Signed on 03/11/2017 at 12:10 by:  Estrella Deeds, M.D.  [employer], [facility]  Urology PGY-5  Pager #: [phone]  PI #: [id number]  Urology Service Pager #: [phone]

## 2017-03-11 NOTE — Nursing Note (Signed)
Vital signs taken, allergies verified, screened for pain, med hx taken (if appropriate).  Jannae Fagerstrom L Al Bracewell, MA

## 2017-03-11 NOTE — Nursing Note (Signed)
Patient prepped for cystoscopy. Allergies verified: Lidocaine : no, Betadine : no, Latex : no, Cipro :no, Sulfa :  yes.  History of bladder cancer  no. Scope used 4536.    Pre-Procedure Checklist - Short Version    ID verified by two sources (select any two from list): MRN, DOB and Name  Site Bladder  Procedure: Cysto  Surgical/Procedure pause: yes    Site(s) marked: N/A  Position: N/A  Consent: Available; reviewed by MD  Pre - Sedation H&P: Non-Sedation  History and Physical: Available; reviewed by MD  Other relevant documentation:  N/A  Relevant images:  N/A  Implants: N/A  Special Equipment:  N/A  Blood products matched:  N/A  Other pre-procedural information:  N/A  Patient concurs: Yes  Team present and concurs:  Single provider procedure      Patient identified using name and date of birth.  Vitals taken, chief complaint identified, drug allergies verified, screened for pain, medications reviewed, pharmacy confirmed.   Lekisha Mcghee, LVN

## 2017-03-11 NOTE — Nursing Note (Signed)
Urine dipstick was performed as ordered and results noted.

## 2017-03-11 NOTE — Progress Notes (Signed)
This patient was seen, evaluated, and care plan was developed with the resident.  I agree with the assessment and plan as outlined in the resident's note. I spent 20 minutes with the patient, 15 minutes of which were spent counseling the patient on microscopic hematuria workup.    The patient indicates understanding of these issues and agrees with the plan.     Lamona Curl, MD, FACS  Professor and [facility]  Department of Urologic Surgery    This patient was seen, evaluated, and care plan was developed with the resident.  I agree with the assessment and plan as outlined in the resident's note.  I was present for the entire cytoscopic procedure.  Lamona Curl, MD, FACS  Professor and [facility] of Urology  [phone]

## 2017-03-11 NOTE — Progress Notes (Signed)
[name], MD    Re: Nautia, Lem  MRN# [id number]  DOB: 01/06/1946  DOV: 03/11/2017    Dear [name], MD    I had the pleasure today of meeting your [name] at the [facility] Urology faculty practice. As you recall, he is a 71yrold female referred for consultation regarding her new diagnosis of microscopic hematuria.    She was noted to have microscopic hematuria (RBC 7) during routine workup on 03/06/17. She has never had gross hematuria. CTAP is pending. 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 Patient denies dysuria, gross hematuria or seeing clots in blood. Patient states she has noticed increased urinary frequency since 6 months ago. She woke up about 6 times last night to void. She does not feel that she completely empties the bladder each time. She smoked 1 pack/week for about 15 years but quit 30 years ago.     Past Medical History:   Patient Active Problem List   Diagnosis    DM2 (diabetes mellitus, type 2) ([place]    Depression with anxiety    Stress at home    Leg cramps-right calf    Right ear pain    Fibromyalgia    HTN (hypertension)    GERD (gastroesophageal reflux disease)    Hyperlipidemia with target LDL less than 70    Vitamin D insufficiency    Abnormal LFTs    Renal cyst ( 6.4 cm cyst left kidney)    Cough    Type 2 diabetes mellitus without complication (HCC)    Fatty liver    Macroalbuminuric diabetic nephropathy (HCC)    History of actinic keratoses    Cataract    Ptosis of eyelid    Liver fibrosis    Adrenal nodule ([place]    Medication Therapy Auth    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[place]    Mixed conductive and sensorineural hearing loss of both ears    Neoplasm of uncertain behavior of skin    Abdominal pain, generalized    Nausea without vomiting    Asymptomatic microscopic hematuria     Past Surgical History:   Past Surgical History:    Procedure Laterality Date    COLONOSCOPY  01/04/14    polyp--TA and erosion; hemorrhoids; tics; repeat x 3 yrs    MAMMOPLASTY, REDUCTION  1985    PHACOEMULSIFICATION, CATARACT Right 02/12/2015    with IOL    PR ESOPHAGOGASTRODUODENOSCOPY TRANSORAL DIAGNOSTIC  02/12/2016    EGD--polyp--path pending; antral gastritis; no varices     PR ESOPHAGOGASTRODUODENOSCOPY TRANSORAL DIAGNOSTIC  06/11/2016    EGD--biopsy at site of carcinoid tumor path pending     PR KNEE SCOPE,DIAGNOSTIC  1980s    Knee arthroscopy    PR LIGATE FALLOPIAN TUBE      Tubal ligation    PR REPAIR TYMPANIC MEMBRANE      Tympanoplasty    PR TOTAL ABDOM HYSTERECTOMY  1980s    partial; no BSO    REPAIR, BLEPHAROPTOSIS Bilateral 08/06/2015    Blepharoplasty bilateral upper lids     Family History:   Cancer Unknown, Heart Disease- mother and father, Diabetes- father, Stroke- mother    Social History:   Living: Married, retired. Worked in hospital admin.   Tobacco: Quit 30 years ago; smoked 15 years; 1 pack/week   EtOH: 1 glass of wine/week   Drugs: No    Current Outpatient Medications:  Current Outpatient Medications   Medication Sig Dispense Refill    Amlodipine (NORVASC) 10 mg Tablet Take 1 tablet by mouth every day. 90 tablet 3    Atorvastatin (LIPITOR) 10 mg Tablet Take 1 tablet by mouth every day at bedtime. 90 tablet 3    Blood Sugar Diagnostic (ONETOUCH ULTRA TEST) Strips Use to test blood glucose 3x/day 300 strip 11    empagliflozin (JARDIANCE) 25 mg Tablet Take 1 tablet by mouth every day. 90 tablet 3    FLUTICASONE/SALMETEROL (ADVAIR DISKUS INHA) Take  by inhalation.      GlipiZIDE (GLUCOTROL XL) 10 mg SR Tablet Take 2 tablets by mouth every morning with a meal. (diabetes) 60 tablet 5    Insulin Glargine (LANTUS SOLOSTAR U-100 INSULIN) 100 unit/mL (3 mL) Pen Inject 22 units every night before bed. Please supply 90 day supply. 30 mL 2    Insulin Pen Needles, Disposable, (BD ULTRA-FINE III SHORT PEN NEEDLE) 31 gauge x 5/16" Use  for injecting insulin 1 time per day 100 each 11    Losartan (COZAAR) 25 mg Tablet Take 1 tablet by mouth every day. 90 tablet 3    Metformin (GLUCOPHAGE) 1,000 mg tablet Take 1 tablet by mouth 2 times daily with meals. (diabetes) 180 tablet 3    Metoclopramide (REGLAN) 10 mg Tablet TAKE 1 TABLET BY MOUTH 4 TIMES DAILY. 30 MINUTES BEFORE EACH MEAL 360 tablet 0    Omeprazole (PRILOSEC) 20 mg Delayed Release Capsule TAKE 1 CAPSULE BY MOUTH TWICE DAILY BEFORE MEALS 180 capsule 1    Ondansetron (ZOFRAN, AS HYDROCHLORIDE,) 8 mg Tablet Take 1 tablet by mouth every 8 hours if needed. 270 tablet 3    PEG 3350-Electrolytes-Vit C (MOVIPREP) 100-7.5-2.691 gram Powder in Packet Take as directed by GI instruction sheet 1 packet 0    Triamcinolone (KENALOG) 0.1 % Cream Apply to the affected area 2 times daily. Use twice a day for two weeks on the chest lesion. 30 g 1    Venlafaxine ([name]) 75 mg Tablet Take 2 tabs by mouth every morning and 1 tab every evening. 90 tablet 11     No current facility-administered medications for this visit.      Labs:  Urine analysis   03/06/17: RBC 7  12/11/15: RBC 0-5  08/29/16: RBC 2    Cr 03/06/17   0.94     Imaging:   CTAP w/ contrast pending    I personally reviewed the imaging   02/27/2017 PET/CT   1. No definite focus of uptake suspicious for tumor.   2. There are scattered, nonspecific areas of radiotracer uptake within  the second and third portions of the duodenum as described above, without  CT correlate. This finding is nonspecific, tumor cannot be entirely  excluded. However, attention on follow-up is recommended. Please note in  the clip is no longer visualized in the current study.  3. Probable left adrenal adenoma. If there is clinical concern  dedicated MRI or CT with adrenal protocol is recommended.    Pathology:  NA    Review of systems: GENERAL:  Normal energy, no malaise.  CARDIAC:  No history of MI, CHF, chest pain or arrhythmias.  PULMONARY:  No history of COPD,  hemoptysis, or shortness of breath.  ENDOCRINE:  No history of thyroid disorders.  MUSCULOSKELETAL:  No history of back pain or arthritis.  NEURO:  No history of strokes, seizures, or altered mental status.  GI:  No history of blood per rectum, melena,  GERD, peptic ulcer disease, or liver dysfunction.  GU:  No history of stones, hematuria, or recurrent UTIs.  PSYCH:  No history of psychosis or depression.  SKIN:  No history of psoriasis or other rashes.  HEM:  No history of easy bleeding and bruising or other dyscrasias.  All other systems negative.  See patient's questionnaire for details.     On physical examination, this is a relatively well-appearing.  Vital signs:  HEENT:  PERRLA, EOMI.  Oropharynx is benign; neck is supple.  CHEST:  Clear non labored breathing.  HEART:  Regular rate.  ABDOMEN:  Soft, non-tender, non distended.  SKIN:  No lesions.  NEURO:  No focal deficits.  EXTREMITIES:  No clubbing, cyanosis, or edema.    In review this a 45yrfemale with a new diagnosis of microscopic hematuria.  Patient has been consented for cystoscopy today.     Once again, thank you for requesting this consultation. Please do not hesitate to contact me with any questions or concerns.    UTiptonInterpreter used: no    I spent 15 minutes with the patient, 15 minutes of which were spent counseling the patient on microscopic hematuria.    The patient indicates understanding of these issues and agrees with the plan.    Electronically Signed   PVerlon Setting MD   Urologic Surgery, PGY1  PI# [id number] Service pager: [phone]

## 2017-03-12 NOTE — Addendum Note (Signed)
Addended by: [name] on: 03/12/2017 06:46 PM     Modules accepted: Orders

## 2017-03-17 ENCOUNTER — Encounter: Payer: Self-pay | Admitting: "Endocrinology

## 2017-03-20 ENCOUNTER — Ambulatory Visit: Payer: Medicare Other | Admitting: Gastroenterology

## 2017-04-04 HISTORY — PX: PR ESOPHAGOGASTRODUODENOSCOPY TRANSORAL DIAGNOSTIC: 43235

## 2017-04-06 ENCOUNTER — Telehealth: Payer: Self-pay | Admitting: Family Medicine

## 2017-04-06 MED ORDER — DIPHENHYDRAMINE 50 MG CAPSULE
ORAL_CAPSULE | ORAL | 0 refills | Status: DC
Start: 2017-04-06 — End: 2017-05-01

## 2017-04-06 MED ORDER — PREDNISONE 50 MG TABLET
ORAL_TABLET | ORAL | 0 refills | Status: DC
Start: 2017-04-06 — End: 2017-05-01

## 2017-04-06 NOTE — Telephone Encounter (Signed)
Prednisone and benadryl prescriptions sent--risks for each also possible; can feel loopy with Benadryl so not drive; prednisone rarely can be responsible for cutting blood supply to certain joints.  I will be happy to see her tomorrow if patient feels she needs to review further.

## 2017-04-06 NOTE — Telephone Encounter (Signed)
3 patient identifiers used  Reviewed results and message from MD with patient. Questions were answered and patient has no further questions or concerns at this time. Advised to please callback to the advice line at any time if questions arise.  Chevelle Coulson, RN  PCN Triage

## 2017-04-06 NOTE — Telephone Encounter (Signed)
Patient called and was told that she needs to be pre-medicated for her CT Scan . She had a reaction 75yr ago. CT is scheduled for 04/08/2017

## 2017-04-07 ENCOUNTER — Telehealth: Payer: Self-pay | Admitting: GASTROENTEROLOGY

## 2017-04-07 NOTE — Telephone Encounter (Signed)
Pre procedure follow up call : Left message on patient's voicemail  regarding upcoming procedure appointment. Instructed to call with any questions or concerns the [facility] M-F between 8am -4:30 pm.    Tiera Mensinger Senior LVN  [facility]  [phone]

## 2017-04-08 ENCOUNTER — Ambulatory Visit
Admission: RE | Admit: 2017-04-08 | Discharge: 2017-04-08 | Disposition: A | Discharge status: Home / Self Care | Payer: Medicare Other | Source: Ambulatory Visit | Attending: Family Medicine | Admitting: Family Medicine

## 2017-04-08 ENCOUNTER — Ambulatory Visit: Admission: RE | Admit: 2017-04-08 | Payer: Medicare Other | Source: Ambulatory Visit

## 2017-04-08 DIAGNOSIS — R3129 Other microscopic hematuria: Secondary | ICD-10-CM

## 2017-04-08 MED ORDER — IOHEXOL 350 MG IODINE/ML INTRAVENOUS SOLUTION
175.0000 mL | INTRAVENOUS | Status: AC
Start: 2017-04-08 — End: 2017-04-08
  Administered 2017-04-08: 175 mL via INTRAVENOUS

## 2017-04-09 ENCOUNTER — Encounter: Payer: Self-pay | Admitting: Family

## 2017-04-09 ENCOUNTER — Ambulatory Visit: Payer: Medicare Other | Attending: Family Medicine

## 2017-04-09 DIAGNOSIS — R3129 Other microscopic hematuria: Secondary | ICD-10-CM | POA: Insufficient documentation

## 2017-04-09 DIAGNOSIS — E119 Type 2 diabetes mellitus without complications: Secondary | ICD-10-CM

## 2017-04-09 LAB — BASIC METABOLIC PANEL
Calcium: 9.9 mg/dL (ref 8.6–10.5)
Carbon Dioxide Total: 25 mmol/L (ref 24–32)
Chloride: 101 mmol/L (ref 95–110)
Creatinine Serum: 1.12 mg/dL (ref 0.44–1.27)
E-GFR Creatinine (Male): 50 mL/min/{1.73_m2}
E-GFR, African American (Male): 58 mL/min/{1.73_m2}
Glucose: 100 mg/dL — ABNORMAL HIGH (ref 70–99)
Potassium: 3.2 mmol/L — ABNORMAL LOW (ref 3.3–5.0)
Sodium: 141 mmol/L (ref 135–145)
Urea Nitrogen, Blood (BUN): 23 mg/dL — ABNORMAL HIGH (ref 8–22)

## 2017-04-09 NOTE — Telephone Encounter (Signed)
From: [name]  To: [name], MD  Sent: 04/09/2017 2:03 PM PST  Subject: Non-urgent Medical Advice Question    Hi Dr. I am sorry to bother you but I could not get my Lipitor filled due to no refills. Would you please send a new Rx for 90 day fills to [facility] as listed in my chart.     Thank you. Merry Christmas.

## 2017-04-10 ENCOUNTER — Ambulatory Visit: Payer: Medicare Other | Admitting: GASTROENTEROLOGY

## 2017-04-10 ENCOUNTER — Encounter: Payer: Self-pay | Admitting: "Endocrinology

## 2017-04-10 MED ORDER — ATORVASTATIN 10 MG TABLET: 10.0000 mg | ORAL_TABLET | Freq: Every day | ORAL | 0 refills | Status: DC

## 2017-04-10 NOTE — Addendum Note (Signed)
Addended by: Aurea Graff on: 04/10/2017 08:01 AM     Modules accepted: Orders

## 2017-04-12 NOTE — Pre Sedation (Addendum)
Gastroenterology/hepatology Pre-procedure History & Physical   IMMEDIATE PRE-SEDATION ASSESSMENT              Referring provider: [name], MD    Procedure: EGD     HPI: Paula Daugherty is a 71yrold female with history of neuroendocrine tumor of the duodenum, initially diagnosed on routine EGD 02/2016 s/p resection with placement of hemoclip who underwent repeat EGD 06/2016 with no evidence of residual tumor who now presents for a surveillance EGD given recent PET findings of scattered uptake in the second and third portions of the duodenum. The site of the tumor was tattooed on the last EGD. Of note, the hemoclip placed in 2017 was no longer noted on recent PET scan. Patient feels well and denies any abdominal pain, nausea, vomiting, hematemesis, or coffee ground emesis.    ROS: 10 point ROS was obtained and is negative except as mentioned in the HPI.    All available medical, surgical, personal/social history was reviewed.     Physical exam:   General Appearance: alert, no distress, pleasant affect, cooperative.  Eyes: conjunctivae and corneas clear. sclerae normal.  Mouth: normal.  Heart: normal rate and regular rhythm.  Lungs: clear to auscultation.  Abdomen: BS normal. Abdomen soft, slightly tender to palpation in left lower quadrant. No rebound tendernes or guarding.  Extremities:  no edema.     Labs/imaging:   [facility] reviewed: Refer to computer database in the electronic medical record.   Performed outside of [facility](reviewed if applicable): N/A    Impression/ plan:   71yo F with history of neuroendocrine tumor of the duodenum s/p resection 02/2016 who presents for surveillance EGD given recent PET scan findings concerning for scattered uptake in the second and third portions of the duodenum.    Proceed with EGD.    Patient has given permission to discuss biopsy/procedure results with family and/or leave message on voicemail.    Pre-Sedation/Anesthesia Assessment:    Patient is a candidate for  moderate sedation.   Patient is ASA status: 2 - Mild, controlled systemic disease and no functional limitations    Airway Assessment:    Mallampati class 2  ROM normal      The procedure, risks, benefits, and alternatives were explained.  All patient questions were answered.  The informed consent was signed, and will be scanned into the computer database at a later date.     Patient barriers to learning: none     Patient/family understanding: verbalizes       SHeron Sabins MD (PGY-4  Clinical Fellow)   [facility] Department of Internal Medicine  Division of Gastroenterology & Hepatology   Email: [email or web address]  Pager: *[phone]   Attending attestation: I saw and evaluated the patient.  I agree with the findings and the plan of care as documented in the fellow's note.    EGeorgeanne NimMD, MPH  Assistant Professor of Internal Medicine  Division of Gastroenterology and Hepatology

## 2017-04-14 ENCOUNTER — Encounter: Payer: Self-pay | Admitting: GASTROENTEROLOGY

## 2017-04-14 ENCOUNTER — Ambulatory Visit
Admission: RE | Admit: 2017-04-14 | Discharge: 2017-04-14 | Disposition: A | Discharge status: Home / Self Care | Payer: Medicare Other | Source: Ambulatory Visit | Attending: Gastroenterology | Admitting: Gastroenterology

## 2017-04-14 DIAGNOSIS — D3A00 Benign carcinoid tumor of unspecified site: Secondary | ICD-10-CM | POA: Insufficient documentation

## 2017-04-14 DIAGNOSIS — K3189 Other diseases of stomach and duodenum: Secondary | ICD-10-CM | POA: Insufficient documentation

## 2017-04-14 DIAGNOSIS — D132 Benign neoplasm of duodenum: Secondary | ICD-10-CM | POA: Insufficient documentation

## 2017-04-14 HISTORY — DX: Anxiety disorder, unspecified: F41.9

## 2017-04-14 HISTORY — DX: Depression, unspecified: F32.A

## 2017-04-14 HISTORY — DX: Major depressive disorder, single episode, unspecified: F32.9

## 2017-04-14 LAB — POC GLUCOSE: POC GLUCOSE: 141 mg/dL — AB (ref 70–99)

## 2017-04-14 MED ORDER — LIDOCAINE HCL 2 % MUCOSAL SOLUTION
5.0000 mL | Freq: Once | Status: AC
Start: 2017-04-14 — End: 2017-04-14
  Administered 2017-04-14: 5 mL via OROMUCOSAL
  Filled 2017-04-14: qty 15

## 2017-04-14 MED ORDER — FENTANYL (PF) 50 MCG/ML INJECTION SOLUTION
12.5000 ug | INTRAMUSCULAR | Status: DC | PRN
Start: 2017-04-14 — End: 2017-04-20
  Administered 2017-04-14: 75 ug via INTRAVENOUS
  Filled 2017-04-14: qty 4

## 2017-04-14 MED ORDER — NACL 0.9% IV INFUSION
INTRAVENOUS | Status: DC
Start: 2017-04-14 — End: 2017-04-20
  Administered 2017-04-14: 09:00:00 via INTRAVENOUS

## 2017-04-14 MED ORDER — EPINEPHRINE 0.1 MG/ML INJECTION SYRINGE
1.0000 mg | INJECTION | INTRAMUSCULAR | Status: DC | PRN
Start: 2017-04-14 — End: 2017-04-20

## 2017-04-14 MED ORDER — SIMETHICONE 40 MG/0.6 ML ORAL DROPS,SUSPENSION
40.0000 mg | Freq: Once | ORAL | Status: AC
Start: 2017-04-14 — End: 2017-04-14
  Administered 2017-04-14: 40 mg
  Filled 2017-04-14: qty 0.6

## 2017-04-14 MED ORDER — EPINEPHRINE 0.1 MG/ML INJECTION SYRINGE
0.3000 mg | INJECTION | INTRAMUSCULAR | Status: DC | PRN
Start: 2017-04-14 — End: 2017-04-20

## 2017-04-14 MED ORDER — MIDAZOLAM (PF) 1 MG/ML INJECTION SOLUTION
0.5000 mg | INTRAMUSCULAR | Status: DC | PRN
Start: 2017-04-14 — End: 2017-04-20
  Administered 2017-04-14: 4 mg via INTRAVENOUS
  Filled 2017-04-14: qty 10

## 2017-04-14 MED ORDER — FLUMAZENIL 0.1 MG/ML INTRAVENOUS SOLUTION
0.2000 mg | INTRAVENOUS | Status: DC | PRN
Start: 2017-04-14 — End: 2017-04-20

## 2017-04-14 MED ORDER — NALOXONE 0.4 MG/ML INJECTION SOLUTION
0.4000 mg | INTRAMUSCULAR | Status: DC | PRN
Start: 2017-04-14 — End: 2017-04-20

## 2017-04-14 MED ORDER — ATROPINE 0.1 MG/ML INJECTION SYRINGE
0.5000 mg | INJECTION | INTRAMUSCULAR | Status: DC | PRN
Start: 2017-04-14 — End: 2017-04-20

## 2017-04-14 NOTE — Nurse Focus (Signed)
Patient aware and verbalized understanding about the procedure and plan of sedation. No further questions asked Re iterate again that this procedure is done under moderate sedation agreed to proceed with the procedure.Procedural pause done. Viscous Lidocaine 2% given PO. Patient to left side lying position. Sedation administered as ordered Bite block placed,scope passed without difficulty and visualized to duodenum. Biopsies taken from A. Duodenum 2nd portion and B.gastric . Oral secretions suctioned PRN for the entire procedure.Tolerated procedure well. Please see MD notes for reports. Transferred to RR via gurney awake and pain free. Endorsed to [employer]*  In stable condition and for continuity of care.

## 2017-04-14 NOTE — Nurse Focus (Signed)
NURSING PRE-SEDATION ASSESSMENT    Paula Daugherty arrived by ambulating into clinic today at 0730 from home.      Patient has arranged for a ride home from DJ, husband who can be contacted at [phone]. Instructed patient that post sedation, they are not to drive or drink alcohol for the remainder of the day and patient verbalized understanding.      Identification band placed on and two forms of identification information verified: yes    Verified procedure with the patient. yes    NPO since: 1900    Pt completed bowel prep:  Not applicable      Past Surgical History:   Procedure Laterality Date    COLONOSCOPY  01/04/14    polyp--TA and erosion; hemorrhoids; tics; repeat x 3 yrs    MAMMOPLASTY, REDUCTION  1985    PHACOEMULSIFICATION, CATARACT Right 02/12/2015    with IOL    PR ESOPHAGOGASTRODUODENOSCOPY TRANSORAL DIAGNOSTIC  02/12/2016    EGD--polyp--path pending; antral gastritis; no varices     PR ESOPHAGOGASTRODUODENOSCOPY TRANSORAL DIAGNOSTIC  06/11/2016    EGD--biopsy at site of carcinoid tumor path pending     PR KNEE SCOPE,DIAGNOSTIC  1980s    Knee arthroscopy    PR LIGATE FALLOPIAN TUBE      Tubal ligation    PR REPAIR TYMPANIC MEMBRANE      Tympanoplasty    PR TOTAL ABDOM HYSTERECTOMY  1980s    partial; no BSO    REPAIR, BLEPHAROPTOSIS Bilateral 08/06/2015    Blepharoplasty bilateral upper lids       Social History     Tobacco Use    Smoking status: Former Smoker     Packs/day: 0.10     Years: 20.00     Pack years: 2.00    Smokeless tobacco: Former Systems developer    Tobacco comment: quit 30 years ago   Substance Use Topics    Alcohol use: Yes     Alcohol/week: 0.0 oz     Comment: rare shares a beer or glass wine every 2-3 months        Social History     Substance and Sexual Activity   Drug Use No        Past anesthesia responses:  No problem.      Current Outpatient Medications   Medication Sig    Amlodipine (NORVASC) 10 mg Tablet Take 1 tablet by mouth every day.    Atorvastatin (LIPITOR) 10 mg  Tablet Take 1 tablet by mouth every day at bedtime.    Blood Sugar Diagnostic (ONETOUCH ULTRA TEST) Strips Use to test blood glucose 3x/day    DiphenhydrAMINE (BENADRYL) 50 mg Capsule 1 PO 60 minutes prior to CT with IV contrast    empagliflozin (JARDIANCE) 25 mg Tablet Take 1 tablet by mouth every day.    FLUTICASONE/SALMETEROL (ADVAIR DISKUS INHA) Take  by inhalation.    GlipiZIDE (GLUCOTROL XL) 10 mg SR Tablet Take 2 tablets by mouth every morning with a meal. (diabetes)    Insulin Glargine (LANTUS SOLOSTAR U-100 INSULIN) 100 unit/mL (3 mL) Pen Inject 22 units every night before bed. Please supply 90 day supply.    Insulin Pen Needles, Disposable, (BD ULTRA-FINE III SHORT PEN NEEDLE) 31 gauge x 5/16" Use for injecting insulin 1 time per day    Losartan (COZAAR) 25 mg Tablet Take 1 tablet by mouth every day.    Metformin (GLUCOPHAGE) 1,000 mg tablet Take 1 tablet by mouth 2 times daily with meals. (diabetes)  Metoclopramide (REGLAN) 10 mg Tablet TAKE 1 TABLET BY MOUTH 4 TIMES DAILY. 30 MINUTES BEFORE EACH MEAL    Omeprazole (PRILOSEC) 20 mg Delayed Release Capsule TAKE 1 CAPSULE BY MOUTH TWICE DAILY BEFORE MEALS    Ondansetron (ZOFRAN, AS HYDROCHLORIDE,) 8 mg Tablet Take 1 tablet by mouth every 8 hours if needed.    PEG 3350-Electrolytes-Vit C (MOVIPREP) 100-7.5-2.691 gram Powder in Packet Take as directed by GI instruction sheet    PredniSONE (DELTASONE) 50 mg Tablet 1 tab PO 13 hrs prior to CT scan with IV contrast, the 1 tab PO 7 hrs prior to same and then 1 tab PO 1 hr prior    Triamcinolone (KENALOG) 0.1 % Cream Apply to the affected area 2 times daily. Use twice a day for two weeks on the chest lesion.    Venlafaxine ([name]) 75 mg Tablet Take 2 tabs by mouth every morning and 1 tab every evening.     Current Facility-Administered Medications   Medication    Atropine Injection 0.5 mg    Epinephrine (ADRENALIN) 0.1 mg/mL Injection 0.3-1 mg    Epinephrine (ADRENALIN) 0.1 mg/mL Injection  0.3-1 mg    Epinephrine (ADRENALIN) 0.1 mg/mL Injection 1 mg    Fentanyl (SUBLIMAZE) Injection 12.5-75 mcg    Flumazenil (ROMAZICON) Injection 0.2 mg    Lidocaine (XYLOCAINE) 2 % Viscous Solution 5 mL    Midazolam (VERSED) Injection 0.5-3 mg    NaCl 0.9% Infusion    Naloxone (NARCAN) Injection 0.4 mg    Simethicone (MYLICON) 40 FU/9.3 mL Oral Suspension 40-80 mg       Anticoagulants: Patient denies taking Aspirin, NSAIDs, or other anticoagulants for the past 5 days.    OTC/Herbal preparations: none    Reviewed health status with the patient in regards to allergies (including latex), medications, pregnancy, hypertension, atrial fibrilation, liver disease, bleeding disorders, diabetes,  CVA's, seizures, sleep apnea, glaucoma, cancer, prosthesis or implants, infectious diseases, surgical history; and disease of heart, lungs, liver, or kidneys. Positive history reviewed and updated in Health History section of the EMR.    Patient Active Problem List   Diagnosis    DM2 (diabetes mellitus, type 2) ([place])    Depression with anxiety    Stress at home    Leg cramps-right calf    Right ear pain    Fibromyalgia    HTN (hypertension)    GERD (gastroesophageal reflux disease)    Hyperlipidemia with target LDL less than 70    Vitamin D insufficiency    Abnormal LFTs    Renal cyst ( 6.4 cm cyst left kidney)    Cough    Type 2 diabetes mellitus without complication (HCC)    Fatty liver    Macroalbuminuric diabetic nephropathy (HCC)    History of actinic keratoses    Cataract    Ptosis of eyelid    Liver fibrosis    Adrenal nodule (HCC)    Medication Therapy Auth    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HCC)    Mixed conductive and sensorineural hearing loss of both ears    Neoplasm of uncertain behavior of skin    Abdominal pain, generalized    Nausea without vomiting    Asymptomatic microscopic hematuria       Allergies   Allergen Reactions    Contrast Dye [Radiopaque Agent] Hives     Hydrochlorothiazide Other-Reaction in Comments     Patient reports    Januvia [Sitagliptin] Other-Reaction in Comments     Patient reports  Lyrica [Pregabalin] Other-Reaction in Comments     Patient reports    Omnipaque [Iohexol] Other-Reaction in Comments     Patient reports    Prozac [Fluoxetine Hcl] Other-Reaction in Comments     Patient reports    Sulfa (Sulfonamide Antibiotics) Rash    Topiramate Other-Reaction in Comments     Hairfall       Past Medical History:   Diagnosis Date    Asthma     Cirrhosis ([place])     Diabetes mellitus ([place])     Fatty liver     Hypertension     Kidney disease     Liver fibrosis     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/20/2016    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[place]) 04/17/2016    Psychiatric illness        Nursing Assessment Data Base:  Ventilation/Respirations: normal, unlabored.  Circulation/Perfusion/Skin: warm,normal color,dry.  Cognition/Communication -- Behavior: alert and oriented,cooperative.  Gastro-Intestinal: no distress.  Abdomen: soft,non-tender.  Level of Ambulation: self.    Belongings are with patient in garment bag under gurney.  Patient has: Glasses.  Barriers to Learning assessed: none. Patient verbalizes understanding of teaching and instructions.    Nursing assessment completed by:  Danley Danker, RN  04/14/2017 07:39

## 2017-04-14 NOTE — Discharge Instructions (Signed)
Discharge Instructions for upper endoscopy    Findings:  upper endoscopy notable for prominent fold in duodenum, biopsies taken and sent for pathology  Gastritis also appreciated. Biopsies taken from stomach and sent for pathology.    Activity:    Rest today, resume usual activitiy tomorrow     Diet:    Resume usual diet:     Medication:    Resume all usual medications    Follow up:   Call primary care physician for follow-up appointment      During your procedure, air was pumped into your GI tract so your doctor could see clearly to make a diagnosis and/or treat your problem.     Some possible side effects you may experience are:   - Discomfort due to a distended (bloated) abdomen which will subside after a few hours to two days.   - Nausea may be a side effect of the medication and will subside.   - The medications you received may make you dizzy and sleepy, it is important that you do not drive, operate machinery or drink alcohol for at least one day.   - Severe pain is not expected and should be reported    Other side effects may include:  Upper Endoscopy: A sore throat can be helped by cough drops, or a salt-water gargle.    If problems, call [facility] [phone] during business hours Monday through Friday 7:30am to 4:30 pm.  After hours call [phone] and ask to speak to the GI Fellow on call.

## 2017-04-14 NOTE — Procedures (Addendum)
Pre-Procedure Time Out Checklist  (do not remove)     Attestation:  ID verified by two sources (select any two from list): MRN, DOB and Name  Was this an emergency procedure?  no     I attest that I verified the following information prior to performing the procedure: Patient ID, Site and Procedure     Patient Name:    Paula Daugherty  MRN: [id number]   DOB: [birth year]  DOS: 04/14/2017    Procedure: Upper Endoscopy  Sub-procedure: Biopsy     Date of Service: 04/14/2017    Sedation start time: 3295  Scope insertion time: 0909  Time end: 0918    Referring Physician: Dr. [name].     PERFORMING SURGEON: Dr. Glyn Ade.   ASSISTANT SURGEON: Heron Sabins, MD    GI Pre-procedure Indications:  H/o duodenal neuroendocrine tumor    Details of the Procedure:    Informed consent was obtained for the procedure, including sedation. Risks of perforation, hemorrhage, adverse drug reaction, pain, infection and aspiration were discussed. The patient was placed in the left lateral decubitus position. Based on the pre-procedure assessment, including review of the patient's medical history, medications, allergies, and review of systems, the patient had been deemed to be an appropriate candidate for conscious sedation; the patient was therefore sedated with the medications listed below. The patient was monitored continuously with EKG tracing, pulse oximetry, blood pressure monitoring, and direct observations.     An oral examination was performed. The Olympus endoscope was inserted into the mouth and advanced under direct vision to the third portion of the duodenum.   A careful inspection was made as the gastroscope was withdrawn, including a retroflexed view of the proximal stomach; findings and interventions are described below. Appropriate photo documentation was obtained. The patient tolerated the procedure well, and there were no complications. Patient was taken to the recovery area in stable condition.     Sedation:   Sedation was  administered for the procedure/procedures  Versed 4 mg IV and Fentanyl 75 mcg IV     Findings and Interventions:   Esophageal mucosa normal  Esophageal-gastric junction localized at 35 cm.  Squamo-columnar junction regular  Hiatal hernia absent  Gastric mucosa notable for linear areas of erythema in antrum. Biopsies taken with cold forceps and sent for pathology.  Duodenal mucosa notable for a prominent fold in the second portion. Multiple biopsies taken with cold forceps and sent for pathology.  Retroflexed view normal    Impression:   1. A prominent fold was found in the second portion of the duodenum at the site of previous carcinoid.  This may be benign granulation tissue, but biopsies were taken to confirm.  2. Mild antral erythema concerning for gastritis was also noted in the stomach.     Recommendation/Plan:   1. Await pathology to rule out neuroendocrine tumor.      Heron Sabins, MD (PGY-4  Clinical Fellow)   [facility]  Department of Internal Medicine  Division of Gastroenterology & Hepatology   Email: [email or web address]  Pager: [phone]    Attending attestation: I saw and evaluated the patient.  I agree with the findings and the plan of care as documented in the fellow's note.    Georgeanne Nim MD, MPH  Assistant Professor of Internal Medicine  Division of Gastroenterology and Hepatology

## 2017-04-15 ENCOUNTER — Encounter: Payer: Self-pay | Admitting: Family Medicine

## 2017-04-15 NOTE — Telephone Encounter (Signed)
From: [name]  To: [name], MD  Sent: 04/15/2017 8:13 AM PST  Subject: Test Result Question    CT with contrast results are back. Anything you want me to do as a result?     Merry Christmas from [place] and I

## 2017-04-16 ENCOUNTER — Telehealth: Payer: Self-pay | Admitting: SURGERY

## 2017-04-16 NOTE — Telephone Encounter (Signed)
Phone call to patient.  IMPRESSION:  1. No cause for hematuria. No urolithiasis, renal mass, or  uroepithelial mass in the kidneys, ureters, or urinary bladder.  2. No hydronephrosis or hydroureter.  3. Unchanged 1.8 cm left adrenal adenoma.    Hematuria work-up completed.    -The definition and work up for asymptomatic microscopic hematuria [facility]) (>3RBC/hpf) was reviewed with the patient.  We reviewed a careful history, physical examination, and laboratory examinations to rule out benign causes of ASM such as infection, menstruation, vigorous exercise, medical renal disease, viral illness, trauma, or recent urological procedures.  Should the patient have any clinical indicator suspicious for intrinsic renal disease it would warrant concurrent nephrologic work up as well.  We discussed that cystoscopy should be performed on all patients aged 69 years and older and should be performed on all patients who present with risk factors for GU malignancies (e.g. irritative voiding symptoms, current or past tobacco use, chemical exposures).  CT urogram is also necessary to evaluate the renal parenchyma to rule out renal mass and includes an excretory phase to evaluate the urothelium of the upper tracts.  (MRU or renal US +/- retrograde pyelograms is also an acceptable alternative.)  If the patient has history of persistent asymptomatic microscopic hematuria and has two consecutive negative annual UA, then no further UA are necessary.  For persistent microhematuria after negative work-up, yearly UA should be conducted and repeat evaluation within 3-5 years should be considered. All of the above was reviewed with the patient and cystoscopy and further imaging was either reviewed fully or arranged to be completed.  (AUA guidelines 2012)    Patient to have yearly UA with PCP. Will cc Dr. [name].    Electronically Signed on 04/16/2017 at 20:46 by:  Estrella Deeds, M.D.  [employer], [facility]  Urology PGY-5  Pager #: [phone]   PI #: [id number]  Urology Service Pager #: [phone]

## 2017-04-20 ENCOUNTER — Encounter: Payer: Self-pay | Admitting: GASTROENTEROLOGY

## 2017-04-20 LAB — SURGICAL PATHOLOGY

## 2017-05-01 ENCOUNTER — Ambulatory Visit: Payer: Medicare Other | Admitting: "Endocrinology

## 2017-05-01 ENCOUNTER — Encounter: Payer: Self-pay | Admitting: "Endocrinology

## 2017-05-01 DIAGNOSIS — E1129 Type 2 diabetes mellitus with other diabetic kidney complication: Secondary | ICD-10-CM

## 2017-05-01 DIAGNOSIS — I10 Essential (primary) hypertension: Secondary | ICD-10-CM

## 2017-05-01 DIAGNOSIS — E279 Disorder of adrenal gland, unspecified: Secondary | ICD-10-CM

## 2017-05-01 DIAGNOSIS — E119 Type 2 diabetes mellitus without complications: Secondary | ICD-10-CM

## 2017-05-01 MED ORDER — AMLODIPINE 10 MG TABLET
10.0000 mg | ORAL_TABLET | Freq: Every day | ORAL | 3 refills | Status: DC
Start: 2017-05-01 — End: 2017-08-14

## 2017-05-01 MED ORDER — LOSARTAN 25 MG TABLET
25.0000 mg | ORAL_TABLET | Freq: Every day | ORAL | 3 refills | Status: DC
Start: 2017-05-01 — End: 2017-08-14

## 2017-05-01 NOTE — Patient Instructions (Signed)
1. No changes to medications today    2. Please increase checking blood sugar later in the day and drop off meter for download after you return from visiting your mother    3. Please have non-fasting blood tests done after June 07, 2017    4. Return to clinic in ~3 months

## 2017-05-01 NOTE — Nursing Note (Signed)
Vital signs taken, allergies verified, screened for pain, tobacco hx verified.     Weslie Pretlow, MA

## 2017-05-01 NOTE — Progress Notes (Signed)
Endocrinology Follow up clinic note:    Chief Complaint:  "I'm here to follow up on my diabetes and adrenal nodule."    HPI: Paula Daugherty is a 71yrold female who has a diagnosis of type 2 diabetes mellitus who presents to Endocrinology for follow up. Her history is as follows:  She was initially diagnosed with diabetes around 2000 on routine labs. She has a strong family history of diabetes so she wasn't surprised at this. She was started on Metformin around 2005 and then she was started on insulin in 2014. She was up to 64 units of Lantus at night. However, after making changes to her diet and lifestyle, she was taken off insulin in 2015.        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    Interim History: She returns today for follow up. Her last visit with Endocrinology was on 12/19/2016. At this visit, she was started on Lantus and she has slowly titrated this medication up to 22 units QHS. She has seen some improvement in her blood glucose since making this change and she reports minimal hypoglycemia. .    She recently had an upper endoscopy done and this did not demonstrate any new evidence of carcinoid recurrence.     She continues to have bouts of stomach pain but these have improved and she no longer needs reglan. She did have a bout of vomiting when [place] in [place] this resolved on its own.     Current Regimen:               Metformin 1000 mg BID             Glipizide XR 20 mg daily             Jardiance 25 mg daily   Lantus 22 units QHS  Hypoglycemia: Had 1 value in the 70s >1 month ago  Hyperglycemia: Yes, has been running higher overall  Meter brought today: Yes  Checks blood glucose: 2x/day  Home blood glucose numbers per pt's log:    Blood Glucose:                         Pre-breakfast: 118 to 209                       Pre-dinner: 101 to 140  Last eye exam: 06/03/2016, no retinopathy, f/u 1 year  Foot exam: Will do today    ROS:  All other systems were  reviewed and are negatative except for pertinent positive and negative responses as documented in HPI.     Medications:  Medication reconciliation was performed today.   Current Outpatient Medications on File Prior to Visit   Medication Sig Dispense Refill    Atorvastatin (LIPITOR) 10 mg Tablet Take 1 tablet by mouth every day at bedtime. 90 tablet 0    Blood Sugar Diagnostic (ONETOUCH ULTRA TEST) Strips Use to test blood glucose 3x/day 300 strip 11    empagliflozin (JARDIANCE) 25 mg Tablet Take 1 tablet by mouth every day. 90 tablet 3    FLUTICASONE/SALMETEROL (ADVAIR DISKUS INHA) Take  by inhalation.      GlipiZIDE (GLUCOTROL XL) 10 mg SR Tablet Take 2 tablets by mouth every morning with a meal. (diabetes) 60 tablet 5    Insulin Glargine (LANTUS SOLOSTAR U-100 INSULIN) 100 unit/mL (3 mL) Pen Inject 22  units every night before bed. Please supply 90 day supply. 30 mL 2    Insulin Pen Needles, Disposable, (BD ULTRA-FINE III SHORT PEN NEEDLE) 31 gauge x 5/16" Use for injecting insulin 1 time per day 100 each 11    Metformin (GLUCOPHAGE) 1,000 mg tablet Take 1 tablet by mouth 2 times daily with meals. (diabetes) 180 tablet 3    Omeprazole (PRILOSEC) 20 mg Delayed Release Capsule TAKE 1 CAPSULE BY MOUTH TWICE DAILY BEFORE MEALS 180 capsule 1    Ondansetron (ZOFRAN, AS HYDROCHLORIDE,) 8 mg Tablet Take 1 tablet by mouth every 8 hours if needed. 270 tablet 3    Venlafaxine ([name]) 75 mg Tablet Take 2 tabs by mouth every morning and 1 tab every evening. 90 tablet 11     No current facility-administered medications on file prior to visit.      I did review patient's past medical and family/social history, no changes noted.   PMH:  Past medical history was reviewed from problem list.   Patient Active Problem List   Diagnosis    DM2 (diabetes mellitus, type 2) ([facility])    Depression with anxiety    Stress at home    Leg cramps-right calf    Right ear pain    Fibromyalgia    HTN (hypertension)    GERD  (gastroesophageal reflux disease)    Hyperlipidemia with target LDL less than 70    Vitamin D insufficiency    Abnormal LFTs    Renal cyst ( 6.4 cm cyst left kidney)    Cough    Type 2 diabetes mellitus without complication (HCC)    Fatty liver    Macroalbuminuric diabetic nephropathy (HCC)    History of actinic keratoses    Cataract    Ptosis of eyelid    Liver fibrosis    Adrenal nodule (HCC)    Medication Therapy Auth    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HCC)    Mixed conductive and sensorineural hearing loss of both ears    Neoplasm of uncertain behavior of skin    Abdominal pain, generalized    Nausea without vomiting    Asymptomatic microscopic hematuria     VITAL SIGNS:  BP 152/77 (SITE: right arm, Orthostatic Position: sitting, Cuff Size: regular)   Pulse 76   Temp 37 C (98.6 F) (Temporal)   Wt 80.6 kg (177 lb 11.1 oz)   LMP 05/06/1983   BMI 32.50 kg/m   Body mass index is 32.5 kg/m.    PHYSICAL EXAM:  General Appearance: healthy, alert, no distress, pleasant affect, cooperative.  Eyes:  conjunctivae and corneas clear. PERRL, EOM's intact. sclerae normal.  Mouth: normal.  Neck:  Neck supple. No adenopathy, thyroid symmetric, normal size.  Heart:  normal rate and regular rhythm, no murmurs, clicks, or gallops.  Lungs: clear to auscultation.  Abdomen: BS normal.  Abdomen soft, non-tender.  No masses or organomegaly. No lipohypertrophy appreciated at insulin injection sites.   Extremities:  no cyanosis, clubbing, or edema.  Foot Exam: normal DP and PT pulses, no trophic changes or ulcerative lesions, normal sensory exam and normal monofilament exam.  Skin:  Skin color, texture, turgor normal. No rashes or lesions.  Neuro: Gait normal. No tremor appreciated. Sensation and strength grossly normal.  Mental Status: Appearance/Cooperation: in no apparent distress and well developed and well nourished  Eye Contact: normal  Speech: normal volume, rate, and pitch    LAB  TESTS/STUDIES:   I personally reviewed the following laboratory  and imaging studies.     11/11/2016 11:09 03/02/2017 11:24 03/06/2017 10:58 03/06/2017 10:58 04/09/2017 14:16   SODIUM 138 139 140  141   POTASSIUM 3.8 4.1 3.7  3.2 (L)   CHLORIDE 101 103 99  101   CARBON DIOXIDE TOTAL 26 24 26  25    UREA NITROGEN, BLOOD (BUN) 12 14 15  23  (H)   CREATININE BLOOD 1.05 1.05 0.94  1.12   GLUCOSE 156 (H) 137 (H) 131 (H)  100 (H)   CALCIUM 9.4 9.2 9.5  9.9   PROTEIN  7.7 8.3     ALBUMIN  3.8 4.0     ALKALINE PHOSPHATASE (ALP)  123 (H) 130 (H)     ASPARTATE TRANSAMINASE (AST)  47 (H) 47 (H)     BILIRUBIN TOTAL  1.0 0.9     ALANINE TRANSFERASE (ALT)  47 44     E-GFR, AFRICAN AMERICAN >60 >60 >60  58   E-GFR, NON-AFRICAN AMERICAN 55 54 >60  50   CHOLESTEROL 179       TRIGLYCERIDE 206 (H)       LDL CHOLESTEROL CALCULATION 84       HDL CHOLESTEROL 54       NON-HDL CHOLESTEROL 125       TOTAL CHOLESTEROL:HDL RATIO 3.3       IRON TOTAL   73 73    TRANSFERRIN   271     TOTAL IRON BINDING CAPACITY   377     IRON PERCENT SATURATION   19.4     FERRITIN   11     FOLATE   9.0     ALDOSTERONE,BLOOD 16.6       HGB A1C 7.8 (H)  6.9 (H)     HGB A1C,GLUCOSE EST AVG 177  151     RENIN ACTIVITY 1.1       VITAMIN B12   313        03/02/2017 11:24   WHITE BLOOD CELL COUNT 8.4   HEMOGLOBIN 12.2   HEMATOCRIT 38.4   MCV 79.4 (L)   RDW 16.4 (H)   PLATELET COUNT 239      03/06/2017 11:07   CREATININE SPOT URINE 113.00   MICROALBUMIN URINE 14.0   MICROALBUMIN/CREATININE RATIO 124 (H)     11/18/2016:      Ref Range & Units 88moago        TOTAL VOLUME mL 1650     TIME OF COLLECTION hr 24     DOPAMINE,UR PER VOLUME ug/L 41     DOPAMINE,UR PER 24HR 77 - 324 ug/d 68 (L)     DOPAMINE,UR RATIO TO CRT 0 - 250 ug/g CRT 73     NOREPINEPHRINE,UR PER VOLUME ug/L 21     NOREPINEPHRINE,UR PER 24HR 16 - 71 ug/d 35     NOREPINEPHRINE,UR RATIO TO CRT 0 - 45 ug/g CRT 38     EPINEPHRINE,UR PER VOLUME ug/L <1     EPINEPHRINE,UR PER 24 HR 1 - 7 ug/d <2     EPINEPHRINE,UR RATIO  TO CRT 0 - 20 ug/g CRT <2            Ref Range & Units 166mogo        TOTAL VOLUME mL 1650     TIME OF COLLECTION hr 24     CORTISOL,UR FREE PER VOLUME ug/L 16.50     CORTISOL,UR FREE PER 24HR <=45.0 ug/d 27.2     CORTISOL,UR FREE RATIO TO  CRT ug/g CRT 29.46            Ref Range & Units 30moago        TOTAL VOLUME mL 1650     TIME OF COLLECTION hr 24     METANEPHRINE,UR-PER VOLUME ug/L 61     METANEPHRINE,UR-PER 24HR 39 - 143 ug/d 101     METANEPHRINE,UR-RATIO TO CRT 0 - 300 ug/g CRT 109     NORMETANEPHRINE,UR-PER VOLUME ug/L 304     NORMETANEPHRINE,UR-PER 24HR 109 - 393 ug/d 502 (H)     NORMETANEPHRIN,UR-RATIO TO CRT 0 - 400 ug/g CRT 543 (H)     METANEPHRINES INTERPRETATION   See Note     CREATININE,UR PER VOLUME mg/dL 56     CREATININE,UR PER 24HR 500 - 1400 mg/d 924        CT Abdomen (04/08/2017):  FINDINGS:  URINARY COLLECTING SYSTEM:  Renal stones: None.  Ureteral stones: None.  Bladder stones: None.  Solid / enhancing renal mass: None. Left interpolar cyst with thin  peripheral calcification. Additional subcentimeter hypodensities, too small  to characterize but statistically likely benign.  Renal enhancement: Normal symmetric enhancement of the kidneys.  There is segmental nonopacification of the bilateral distal ureters.  Hydronephrosis: None.  Hydroureter: None.  Renal collecting system: No intraluminal filling defects.  Ureters: No intraluminal filling defects in the opacified segments of the  ureters. Segments of the ureter that are not opacified do not show any wall  thickening, suspicion of an enhancing mass, or focal or segmental  dilatation.  Urinary bladder: No focal or asymmetric bladder wall thickening, focal  filling defects, or enhancing polypoid lesions.    REMAINING ABDOMEN AND PELVIS:  Lower Chest: Heavy coronary artery calcification. Bibasilar atelectasis.  Liver: Unchanged hepatic steatosis and hepatomegaly.  Bile Ducts: Unremarkable.  Gallbladder: Unremarkable.  Pancreas:  Unremarkable.  Spleen: Unremarkable.  Adrenal Glands: Unchanged 1.8 cm left adenoma.  GI Tract: Diverticulosis without evidence of acute diverticulitis.  Peritoneal Cavity: No free fluid or free air.  Uterus and Ovaries: Status post hysterectomy. No adnexal masses.  Lymph Nodes: Unchanged 1.3 cm porta hepatic lymph node. Unchanged  additional smaller upper abdominal lymph nodes.  Major Vascular Structures: Moderate aortoiliac artery calcification.  Soft Tissues: Unchanged partially calcified left gluteal soft tissue  densities, likely injection granulomas.  Musculoskeletal: No suspicious bony lesions or acute fractures. Mild  multilevel degenerative changes of the visualized spine.    IMPRESSION:  1. No cause for hematuria. No urolithiasis, renal mass, or  uroepithelial mass in the kidneys, ureters, or urinary bladder.  2. No hydronephrosis or hydroureter.  3. Unchanged 1.8 cm left adrenal adenoma.    Impression: This is a 89yrld female with Diabetes Mellitus Type II who presents to Endocrinology for follow up. Overall, her glycemic control is near goal as evidenced by her most recent A1c of 6.9%.  Review of her blood glucose meter download today demonstrates that she has elevated blood glucose most of the time when she is checking. We discussed adding an additional medication for her diabetes regimen and one of Paula Daugherty's goals is weight loss. Given her GI issues, I would be hesitant to add a GLP-1 receptor agonist as this can increase nausea/vomiting and we discussed SGLT-2 inhibitors today and the risks/benefits of this medication, including the common side effect of genital mycotic infections. After discussion, Paula Daugherty willing to try this medication and she will start Jardiance 10 mg daily.      She  also has a history of an adrenal nodule. Hormonal evaluation was normal in 01/2016 and she will be due for repeat hormonal evaluation in 01/2017 and repeat imaging in 12/2016. Will decrease glipizide to 10 mg  daily with this change.      DIABETES HISTORY  (-) h/o retinopathy.      (-) h/o microalbuminuria/nephropathy.  (-) h/o neuropathy.  (-) h/o Autonomic dysfunction  (-) h/o Gastroparesis  (-) h/o CAD  (-) h/o PVD     Last eye exam: 05/2016, f/u 1year  Last urine microalbumin: 03/2017, 124  Pneumovax: 12/2014  Influenza vaccine: 02/2016  (+) ASA  (+) Statin  (+) ACE/ARB          Recommendations:  (E11.29) Type 2 diabetes mellitus with other diabetic kidney complication  (primary encounter diagnosis)  - Continue current dose of metformin, Jardiance, glipizide and Lantus  - Discussed taking 1/2 of glipizide dose on busy days when she may not get to eat lunch until late  - Encouraged patient to continue blood glucose monitoring to at least 1x/day, goal to check 2x/day  - Last LDL at goal, continue statin, repeat due 11/2017  - Last microalbumin:creatinine ratio elevated, continue cozaar, repeat due 03/2018  - Up to date on screening eye exam, next due 05/2017  - Repeat A1c prior to follow up appointment     2. Adrenal nodule  - Biochemical evaluation normal, repeat due in 11/2017 (~1 year from previous) for 5 years total (2022)    Approximately 30 minutes were spent with patient, greater than 50% of which was spent counseling the patient on diabetes management and on coordination of care.    Follow up in 3 months    If you have any questions, please do not hesitate to contact me at [phone].  Thank you for allowing me to participate in the care of this patient.    EDUCATION:  I educated/instructed the patient or caregiver regarding all aspects of the above stated plan of care.  The patient or caregiver indicated understanding.      [place] interpreter was not used.    Report electronically signed by:  Sunday Spillers, M.D.  Clinical Assistant Professor  Department of Endocrinology  Pager # [phone]

## 2017-05-11 ENCOUNTER — Encounter: Payer: Self-pay | Admitting: GASTROENTEROLOGY

## 2017-05-13 ENCOUNTER — Other Ambulatory Visit: Payer: Self-pay | Admitting: GASTROENTEROLOGY

## 2017-05-13 ENCOUNTER — Other Ambulatory Visit: Payer: Self-pay | Admitting: Neurology

## 2017-05-13 ENCOUNTER — Encounter: Payer: Self-pay | Admitting: "Endocrinology

## 2017-05-13 DIAGNOSIS — F418 Other specified anxiety disorders: Secondary | ICD-10-CM

## 2017-05-13 DIAGNOSIS — E1165 Type 2 diabetes mellitus with hyperglycemia: Secondary | ICD-10-CM

## 2017-05-13 MED ORDER — ONDANSETRON HCL 8 MG TABLET
8.0000 mg | ORAL_TABLET | Freq: Three times a day (TID) | ORAL | 3 refills | Status: DC | PRN
Start: 2017-05-13 — End: 2018-08-10

## 2017-05-13 MED ORDER — BLOOD SUGAR DIAGNOSTIC STRIPS: ORAL_STRIP | 11 refills | Status: DC

## 2017-05-13 MED ORDER — VENLAFAXINE 75 MG TABLET
ORAL_TABLET | ORAL | 5 refills | Status: DC
Start: 2017-05-13 — End: 2017-05-13

## 2017-05-13 NOTE — Telephone Encounter (Signed)
This is a  REFILL AUTHORIZATION for the faxed request for Venlafaxine 75 mg tablets prescribed by Dr. Donnamarie Poag.  Medication refilled per Prescription Refill by Clinic RN Standardized Procedure Policy RW-48.  Last office visit 10/15/16.    Jolyn Nap, RN  Neurology

## 2017-05-14 NOTE — Telephone Encounter (Signed)
**  Patient requests 90 days supply**

## 2017-05-14 NOTE — Telephone Encounter (Signed)
Rx for 90 day supply of Venlafaxine 75 mg tablets sent to [facility] per electronic request.    Jolyn Nap, RN  Neurology

## 2017-05-18 ENCOUNTER — Telehealth: Payer: Self-pay | Admitting: "Endocrinology

## 2017-05-18 NOTE — Telephone Encounter (Signed)
Our clinic received [facility] physician order form for Glucose Test Strips for ENDFOL clinic patient.  I faxed paperwork to  [phone] to be placed in doctor's box   Can you assist with this for patient at your convenience?  Thank You,   Roque Lias,  Refill Coordinator Ext# [phone],  [facility] Refill Service and  [facility] and Special[facility] Clin[facility],  [place], [place] and [place], [place] [id number]

## 2017-05-18 NOTE — Telephone Encounter (Signed)
Placed in Dr's inbox. Paula Daugherty, [place]

## 2017-05-26 ENCOUNTER — Ambulatory Visit (INDEPENDENT_AMBULATORY_CARE_PROVIDER_SITE_OTHER): Payer: Medicare Other

## 2017-05-26 DIAGNOSIS — E119 Type 2 diabetes mellitus without complications: Secondary | ICD-10-CM

## 2017-05-26 NOTE — Nursing Note (Signed)
Patient's Blood Glucose Meter was downloaded via [employer] tablet. Report printed and presented for MD's review.    Paula Daugherty, [place]

## 2017-05-26 NOTE — Telephone Encounter (Signed)
Form received from [facility] clinic to place in MD box for signature. MD anticipated back in clinic tomorrow, 05/27/2017.    Will fax once signed.    Margorie John  Endocrinology MAII

## 2017-05-27 NOTE — Telephone Encounter (Signed)
Signed form obtained, faxed with supporting documentation to:    [facility]  F# [phone]    [facility]  Endocrinology MAII

## 2017-05-27 NOTE — Telephone Encounter (Signed)
Form has been signed and placed in outgoing Endo fax box.     Sunday Spillers, M.D.  Clinical Assistant Professor  Department of Endocrinology  Pager # [phone]

## 2017-06-01 ENCOUNTER — Encounter: Payer: Self-pay | Admitting: Hematology & Oncology

## 2017-06-03 ENCOUNTER — Ambulatory Visit: Payer: Self-pay | Admitting: Family Medicine

## 2017-06-17 ENCOUNTER — Encounter: Payer: Self-pay | Admitting: Family Medicine

## 2017-06-29 ENCOUNTER — Ambulatory Visit (INDEPENDENT_AMBULATORY_CARE_PROVIDER_SITE_OTHER): Payer: Medicare Other

## 2017-06-29 ENCOUNTER — Ambulatory Visit
Admission: RE | Admit: 2017-06-29 | Discharge: 2017-06-29 | Disposition: A | Discharge status: Home / Self Care | Payer: Medicare Other | Source: Ambulatory Visit | Attending: Family Medicine | Admitting: Family Medicine

## 2017-06-29 ENCOUNTER — Encounter: Payer: Self-pay | Admitting: Family Medicine

## 2017-06-29 ENCOUNTER — Encounter: Payer: Self-pay | Admitting: Optometrist

## 2017-06-29 ENCOUNTER — Ambulatory Visit (INDEPENDENT_AMBULATORY_CARE_PROVIDER_SITE_OTHER): Payer: Medicare Other | Admitting: Family Medicine

## 2017-06-29 ENCOUNTER — Ambulatory Visit: Payer: Vision Other Private Insurance | Admitting: Optometrist

## 2017-06-29 ENCOUNTER — Ambulatory Visit (INDEPENDENT_AMBULATORY_CARE_PROVIDER_SITE_OTHER)
Admission: RE | Admit: 2017-06-29 | Discharge: 2017-06-29 | Disposition: A | Discharge status: Home / Self Care | Payer: Medicare Other | Source: Ambulatory Visit | Attending: Family Medicine | Admitting: Family Medicine

## 2017-06-29 VITALS — BP 144/70 | HR 82 | Temp 98.3°F | Resp 16 | Wt 169.1 lb

## 2017-06-29 DIAGNOSIS — Z135 Encounter for screening for eye and ear disorders: Secondary | ICD-10-CM

## 2017-06-29 DIAGNOSIS — D485 Neoplasm of uncertain behavior of skin: Secondary | ICD-10-CM

## 2017-06-29 DIAGNOSIS — M79672 Pain in left foot: Secondary | ICD-10-CM

## 2017-06-29 DIAGNOSIS — R059 Cough, unspecified: Secondary | ICD-10-CM

## 2017-06-29 DIAGNOSIS — R05 Cough: Secondary | ICD-10-CM | POA: Insufficient documentation

## 2017-06-29 DIAGNOSIS — E119 Type 2 diabetes mellitus without complications: Secondary | ICD-10-CM

## 2017-06-29 DIAGNOSIS — I10 Essential (primary) hypertension: Secondary | ICD-10-CM

## 2017-06-29 DIAGNOSIS — Z961 Presence of intraocular lens: Secondary | ICD-10-CM

## 2017-06-29 DIAGNOSIS — H524 Presbyopia: Secondary | ICD-10-CM

## 2017-06-29 LAB — COMPREHENSIVE METABOLIC PANEL
Alanine Transferase (ALT): 46 U/L (ref 5–54)
Albumin: 3.7 g/dL (ref 3.2–4.6)
Alkaline Phosphatase (ALP): 137 U/L — ABNORMAL HIGH (ref 35–115)
Aspartate Transaminase (AST): 50 U/L — ABNORMAL HIGH (ref 15–43)
Bilirubin Total: 1 mg/dL (ref 0.3–1.3)
Calcium: 9.5 mg/dL (ref 8.6–10.5)
Carbon Dioxide Total: 25 mmol/L (ref 24–32)
Chloride: 103 mmol/L (ref 95–110)
Creatinine Serum: 1.15 mg/dL (ref 0.44–1.27)
E-GFR Creatinine (Male): 49 mL/min/{1.73_m2}
E-GFR, African American (Male): 56 mL/min/{1.73_m2}
Glucose: 145 mg/dL — ABNORMAL HIGH (ref 70–99)
Potassium: 3.8 mmol/L (ref 3.3–5.0)
Protein: 8 g/dL (ref 6.3–8.3)
Sodium: 142 mmol/L (ref 135–145)
Urea Nitrogen, Blood (BUN): 17 mg/dL (ref 8–22)

## 2017-06-29 NOTE — Patient Instructions (Addendum)
A laboratory test has been ordered for you.    An X-ray has been ordered for you. Please go to the front desk at [address], [place] to obtain your x-ray. You will receive a letter or telephone call with your results.     Symptomatic therapy suggested: gargle for sore throat, use mist/humidifier at bedside for congestion.  Apply facial warm packs for sinus pain.  Increase fluids, plain mucinex prn.  Also try saline water sprays at least twice per day.   Call or return to clinic if symptoms worsen, fail to improve  or other symptoms develop.    Please see about possibly getting Shinrix from outside pharmacy--repeat due after 2 mos with 1 booster to complete series of 2.     You have been referred to a specialist. We are pleased to offer the services of [facility] specialists located at the [facility] or [place] sites. Please allow 7 business days (3 if urgent) for the referral to be processed. After that time you may call: Dermatology: [address], [address], [phone]

## 2017-06-29 NOTE — Progress Notes (Signed)
Chief Complaint   Patient presents with    Eye Glasses / Contact Lens Rx     72 year old female is here for a CEE. CC: Patient states that is getting harder for her to read wihtout glasses. She reports having occasional stabbing pain OU 2-3 days ago, but has been ill recently since cruise.     Lab Results   Lab Name Value Date/Time    HGBA1C 6.9 (H) 03/06/2017 10:58 AM    HGBA1C 7.8 (H) 11/11/2016 11:09 AM    HGBA1C 8.0 (H) 07/09/2016 11:57 AM    HGBA1C 6.6 (H) 08/30/2015 08:35 AM    HGBA1C 7.7 (H) 03/23/2015 09:04 AM    HGBA1C 6.9 (H) 12/06/2014 11:45 AM   POH:  Status post blepharoplasty OU.   Status post CE ou     Assessment/Plan:    1. Presbyopia. Stable prescription. Released a glasses prescription to the patient, if desired.    2. Status post cataract extraction OU. Stable.     3. 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  No evidence of diabetic retinopathy nor clinically significant macular edema was present in either eye.  I emphasized the long-term risk to vision from diabetes and the importance of strict glycemic control, blood pressure control, along with regular eye exams to detect diabetic retinopathy at an early stage to prevent vision loss. She should return in one year for a dilated eye examination, or sooner if any changes are noticed.    4. Recommend artificial tears for dry eye symptoms.     Return to clinic in one year for a general eye examination or sooner if any changes are noticed.      Laporsha Grealish Pederson-VanBuskirk, O.D, F.A.A.O.  Energy manager of Ophthalmology

## 2017-06-29 NOTE — Nursing Note (Signed)
Patient roomed, chief complaint noted, allergies verified, blood pressure, pulse, respiration, and weight obtained, screened for pain, and pharmacy verified.  Laurie Penado, M.A.

## 2017-06-29 NOTE — Progress Notes (Signed)
Paula Daugherty is a 81yrfemale who presents with a chief complaint of "possible bronchitis".    Chief Complaint   Patient presents with    Diabetes       1. Cough: x 1 month; productive of green sputum; spouse also with same but resolved; was hoping to get ABx ; patient amenable to check CXR  2. DM2; reports baseline FSBS; A1c at goal over 3 mos ago; patient amenable to get labs done   3. Stress: significant stress with daughter not speaking to her; mother also a source of stress; trying to gain balance in her life and travel     ROS:   .Constitutional: no Tmax; no recent fever/chills.  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 90 tablet 3    Atorvastatin (LIPITOR) 10 mg Tablet Take 1 tablet by mouth every day at bedtime. 90 tablet 0    Blood Sugar Diagnostic (ONETOUCH ULTRA TEST) Strips Use to test blood glucose 3x/day 300 strip 11    empagliflozin (JARDIANCE) 25 mg Tablet Take 1 tablet by mouth every day. 90 tablet 3    FLUTICASONE/SALMETEROL (ADVAIR DISKUS INHA) Take  by inhalation.      GlipiZIDE (GLUCOTROL XL) 10 mg SR Tablet Take 2 tablets by mouth every morning with a meal. (diabetes) 60 tablet 5    Insulin Glargine (LANTUS SOLOSTAR U-100 INSULIN) 100 unit/mL (3 mL) Pen  Inject 22 units every night before bed. Please supply 90 day supply. 30 mL 2    Insulin Pen Needles, Disposable, (BD ULTRA-FINE III SHORT PEN NEEDLE) 31 gauge x 5/16" Use for injecting insulin 1 time per day 100 each 11    Losartan (COZAAR) 25 mg Tablet Take 1 tablet by mouth every day. 90 tablet 3    Metformin (GLUCOPHAGE) 1,000 mg tablet Take 1 tablet by mouth 2 times daily with meals. (diabetes) 180 tablet 3    Omeprazole (PRILOSEC) 20 mg Delayed Release Capsule TAKE 1 CAPSULE BY MOUTH TWICE DAILY BEFORE MEALS 180 capsule 1    Ondansetron (ZOFRAN) 8 mg Tablet Take 1 tablet by mouth every 8 hours if needed. 270 tablet 3    Venlafaxine ([name]) 75 mg Tablet TAKE 2 TABLETS BY MOUTH EVERY MORNING AND TAKE 1 TABLET BY MOUTH EVERY EVENING 270 tablet 1     No current facility-administered medications on file prior to visit.      Allergies:   Allergies   Allergen Reactions    Contrast Dye [Radiopaque Agent] Hives    Hydrochlorothiazide Other-Reaction  in Comments     Patient reports    Januvia [Sitagliptin] Other-Reaction in Comments     Patient reports    Lyrica [Pregabalin] Other-Reaction in Comments     Patient reports    Omnipaque [Iohexol] Other-Reaction in Comments     Patient reports    Prozac [Fluoxetine Hcl] Other-Reaction in Comments     Patient reports    Sulfa (Sulfonamide Antibiotics) Rash    Topiramate Other-Reaction in Comments     [place]       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 [employer]    Social Needs    Financial resource strain: Not on file    Food insecurity:     Worry: Not on file     Inability: Not on file    Transportation needs:     Medical: Not on file     Non-medical: Not on file   Tobacco Use    Smoking status: Former Smoker     Packs/day: 0.10      Years: 20.00     Pack years: 2.00    Smokeless tobacco: Never Used    Tobacco comment: quit 30 years ago   Substance and Sexual Activity    Alcohol use: Yes     Alcohol/week: 0.0 oz     Comment: rare shares a beer or glass wine every 2-3 months     Drug use: No    Sexual activity: Yes     Partners: Male   Lifestyle    Physical activity:     Days per week: Not on file     Minutes per session: Not on file    Stress: Not on file   Relationships    Social connections:     Talks on phone: Not on file     Gets together: Not on file     Attends religious service: Not on file     Active member of club or organization: Not on file     Attends meetings of clubs or organizations: Not on file     Relationship status: Not on file    Intimate partner violence:     Fear of current or ex partner: Not on file     Emotionally abused: Not on file     Physically abused: Not on file     Forced sexual activity: Not on file   Other Topics Concern    Not on file   Social History Narrative    Not on file     Family History   Problem Relation Name Age of Onset    Diabetes Father      Heart Mother          MI at 66s     Non-contributory Brother      Non-contributory Brother         PE:  BP 144/70 (SITE: right arm, Orthostatic Position: sitting, Cuff Size: regular)   Pulse 82   Temp 36.8 C (98.3 F) (Temporal)   Resp 16   Wt 76.7 kg (169 lb 1.5 oz)   LMP 05/06/1983   BMI 30.93 kg/m   General Appearance: healthy, alert, no distress, pleasant  affect, cooperative.  Eyes:  clear.  Ears:  normal TM on left; right TM with slight cerumen not removed with curette and patient amenable to use over the counter drops;  and canal and external inspection of ears show no abnormality.  Nose:  mucosa erythematous and swollen, no tenderness to palpation along sinuses.  Mouth: OP clear.  Neck:  Supple; no LAD.  Heart:  normal rate and regular rhythm, no murmurs, clicks, or gallops.  Lungs: clear to auscultation; no R/R/W; moving air well;  RR Of 12; no cough  Musculoskeletal: left foot without obvious deformity or skin changes; no pin point tenderness to palpation     Assessment and Plan:  (R05) Cough  (primary encounter diagnosis)  Comment: x 1 month; suspect viral bronchitis; to check CXR due to duration--negative CXR 06/29/2017   Plan: DX CHEST 2 VIEWS, CBC WITH DIFFERENTIAL          (E11.9) Type 2 diabetes mellitus without complication, without long-term current use of insulin (HCC)  Comment: FSBS reported at baseline; due for A1c; working on desired weight loss and increase activity   Plan: HEMOGLOBIN A1C, COMPREHENSIVE METABOLIC PANEL          (I45) Essential hypertension  Comment: recheck per above   Plan: weight loss and increase in activity encouraged     (Y09.983) Left foot pain  Comment: new x 2 days; unclear diagnosis or etiology   Plan: FOOT 3+ VIEWS, LEFT          (D48.5) Neoplasm of uncertain behavior of skin  Comment: several mos of reported under left eye redness and requesting to see derm   Plan: [place]            Total encounter time including history, physical examination, and coordination of care was approximately 20 minutes of which more than 50% was spent counseling regarding assessment/diagnosis and treatment plan. No guarantees were made regarding her medical care or treatment outcome. Barriers to Learning: none.  Patient verbalizes understanding of teaching and instructions.    Electronically signed by:    [name], MD  [place], [place] Board of Family Medicine  Associate physician [facility], [place]   [phone]

## 2017-06-30 LAB — CBC WITH DIFFERENTIAL
Basophils % Auto: 2 %
Basophils Abs Auto: 0.2 10*3/uL (ref 0.0–0.2)
Eosinophils % Auto: 2 %
Eosinophils Abs Auto: 0.2 10*3/uL (ref 0.0–0.5)
Hematocrit: 41 % (ref 36.0–46.0)
Hemoglobin: 12.7 g/dL (ref 12.0–16.0)
Lymphocytes % Auto: 23.6 %
Lymphocytes Abs Auto: 2.6 10*3/uL (ref 1.0–4.8)
MCH: 24.4 pg — ABNORMAL LOW (ref 27.0–33.0)
MCHC: 30.9 % — ABNORMAL LOW (ref 32.0–36.0)
MCV: 78.7 fL — ABNORMAL LOW (ref 80.0–100.0)
MPV: 8.2 fL (ref 6.8–10.0)
Monocytes % Auto: 7.9 %
Monocytes Abs Auto: 0.9 10*3/uL — ABNORMAL HIGH (ref 0.1–0.8)
Neutrophils % Auto: 64.5 %
Neutrophils Abs Auto: 7 10*3/uL (ref 1.8–7.7)
Platelet Count: 299 10*3/uL (ref 130–400)
RDW: 17.4 % — ABNORMAL HIGH (ref 0.0–14.7)
Red Blood Cell Count: 5.21 10*6/uL — ABNORMAL HIGH (ref 4.00–5.20)
White Blood Cell Count: 10.9 10*3/uL (ref 4.5–11.0)

## 2017-06-30 LAB — HEMOGLOBIN A1C
Hgb A1C,Glucose Est Avg: 143 mg/dL
Hgb A1C: 6.6 % — ABNORMAL HIGH (ref 3.9–5.6)

## 2017-07-01 ENCOUNTER — Encounter: Payer: Self-pay | Admitting: "Endocrinology

## 2017-07-03 ENCOUNTER — Other Ambulatory Visit: Payer: Self-pay

## 2017-07-03 ENCOUNTER — Other Ambulatory Visit: Payer: Self-pay | Admitting: "Endocrinology

## 2017-07-03 DIAGNOSIS — F418 Other specified anxiety disorders: Secondary | ICD-10-CM

## 2017-07-03 NOTE — Progress Notes (Signed)
Ambulatory Case Management    Request received for Ambulatory Case Management from endocrinology.  Pertinent portions of the chart reviewed.  H/o type 2 diabetes mellitus (A1c=6.6% 06/29/17), neuroendocrine carcinoma of duodenum, HTN, depression with anxiety. Patient needing assistance connecting to counselor for stress. Previously open to ACM for assistance connecting to therapy. Last PHQ-9 Score: 13 documented on 03/03/17.     Referred to Mauro Kaufmann LCSW [phone] for connection to behavioral health resources.      Electronically Signed By:    Veatrice Kells  RN Care Manager  [place] [facility]  Health Management and Education  [phone]

## 2017-07-06 ENCOUNTER — Ambulatory Visit: Payer: Medicare Other | Attending: Internal Medicine | Admitting: Internal Medicine

## 2017-07-06 ENCOUNTER — Other Ambulatory Visit: Payer: Self-pay | Admitting: Family Medicine

## 2017-07-06 ENCOUNTER — Encounter: Payer: Self-pay | Admitting: Internal Medicine

## 2017-07-06 VITALS — BP 143/84 | HR 75 | Temp 97.8°F | Resp 16 | Ht 62.0 in | Wt 172.4 lb

## 2017-07-06 DIAGNOSIS — Z85060 Personal history of malignant carcinoid tumor of small intestine: Secondary | ICD-10-CM

## 2017-07-06 DIAGNOSIS — E119 Type 2 diabetes mellitus without complications: Secondary | ICD-10-CM

## 2017-07-06 DIAGNOSIS — K5900 Constipation, unspecified: Secondary | ICD-10-CM

## 2017-07-06 DIAGNOSIS — D3502 Benign neoplasm of left adrenal gland: Secondary | ICD-10-CM

## 2017-07-06 DIAGNOSIS — D3A8 Other benign neuroendocrine tumors: Secondary | ICD-10-CM

## 2017-07-06 DIAGNOSIS — Z08 Encounter for follow-up examination after completed treatment for malignant neoplasm: Secondary | ICD-10-CM | POA: Insufficient documentation

## 2017-07-06 DIAGNOSIS — D3A010 Benign carcinoid tumor of the duodenum: Secondary | ICD-10-CM

## 2017-07-06 DIAGNOSIS — Z9049 Acquired absence of other specified parts of digestive tract: Secondary | ICD-10-CM

## 2017-07-06 MED ORDER — ATORVASTATIN 10 MG TABLET: 10.0000 mg | ORAL_TABLET | Freq: Every day | ORAL | 1 refills | Status: DC

## 2017-07-06 NOTE — Nursing Note (Signed)
Verified pt id x2, vital signs taken, allergies verified,  screened for pain.  Nassim Cosma MA

## 2017-07-06 NOTE — Progress Notes (Signed)
HEMATOLOGY AND ONCOLOGY CLINIC FOLLOW-UP NOTE    REASONS FOR FOLLOW-UP: well differentiated neuroendocrine tumor of duodenum, localized     HEMATOLOGIC/ONCOLOGIC HISTORY  Patient with multiple medical problems including type II diabetes, obesity and NAFLDwho presented with chronic intermittent nausea with vomiting and abdominal pain for two years. Been seen by hepatologist Dr. Virgel Bouquet. Abdominal CT on 12/28/15 is unrevealing. She underwent EGD on 02/12/2016 which revealed a 1-1.5 cm polyp in the second part of the duodenum. Other findings include mild antral gastritis and no evidence of varices. The duodenal polyp was transected by hot snare. Pathology is consistent with grade 1 well differentiated neuroendocrine tumor with positive synaptophysin. Ki67 <2%. Her post-EGD course was complicated by post polypectomy bleeding which required an additional hemoclip at [facility]. Seth.Contes DOTATATE PET/CT scan on 02/28/16 reveals no evidence of residual or metastatic disease. Normal gastrin level at 58 as of 02/29/16.    DISEASE STATUS: localized disease    SUMMARY OF HEMATOLOGIC/ONCOLOGIC THERAPY, RESPONSE & SIDE EFFECTS  1.      S/p polypectomy 02/12/16            -patient was found to have a 1-1.5 cm polyp in the second part of the duodenum during EGD for the workup of nausea and vomiting and abdominal pain.  The polyp was transected by hot snare              -pathology revealed grade 1 well differentiated neuroendocrine tumor [id number] <2%              -VV61 DOTATATE PET/CT scan on 02/28/16 reveals no evidence of residual or metastatic disease              -normal gastrin level at 58 as of 02/29/16    2.         Surveillance (present)              -repeat EGD 06/10/16 revealed the site of prior polypectomy.  A small bumpy raised area was noted at the base of the the hemoclip and marked with spot tattoo.  Antral gastritis was also noted              -pathology showed only duodenal mucosa with reactive changes  with no neuroendocrine tumor present.  The stomach biopsy showed mild chronic antral/oxyntic gastritis without activity and no H pylori was found   -DOTATATE PET/CT on 02/27/17 negative   -repeat scope 04/14/17 shows no evidence of neuroendocrine tumor    Others:  Gastroparesis  Incidental adrenal adenoma - biochemical evaluation.  Repeat CT unchanged.  Been followed by endocrinology    ECOG PERFORMANCE STATUS  [name]    INTERVAL SUBJECTIVE HISTORY  Since the last visit, Paula Daugherty has been doing about the same.  Today she is accompanied by her husband.  She reports still having intermittent nausea which is controlled with zofran.  She also reports stomach pain and been prescribed reglan which somewhat helps the symptom.  Denies any diarrhea; indeed she often suffers from constipation which is her baseline.  Denies any flushing.     - ROS otherwise negative except that noted above    PAST MEDICAL HISTORY  Patient Active Problem List   Diagnosis    DM2 (diabetes mellitus, type 2) ([place])    Depression with anxiety    Stress at home    Leg cramps-right calf    Right ear pain    Fibromyalgia    HTN (hypertension)  GERD (gastroesophageal reflux disease)    Hyperlipidemia with target LDL less than 70    Vitamin D insufficiency    Abnormal LFTs    Renal cyst ( 6.4 cm cyst left kidney)    Cough    Type 2 diabetes mellitus without complication (HCC)    Fatty liver    Macroalbuminuric diabetic nephropathy (HCC)    History of actinic keratoses    Cataract    Ptosis of eyelid    Liver fibrosis    Adrenal nodule (HCC)    Medication Therapy Auth    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HCC)    Mixed conductive and sensorineural hearing loss of both ears    Neoplasm of uncertain behavior of skin    Abdominal pain, generalized    Nausea without vomiting    Asymptomatic microscopic hematuria       Past Medical History:  No date: Anxiety  No date:  Asthma  No date: Cirrhosis ([place])  No date: Depression  No date: Diabetes mellitus ([place])  No date: Fatty liver  No date: Hypertension  2015: Kidney disease  No date: Liver fibrosis  05/20/2016: Neoplasm of uncertain behavior of skin  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[place])  No date: Psychiatric illness    ALLERGIES  Contrast dye [radiopaque agent]; Hydrochlorothiazide; Januvia [sitagliptin]; Lyrica [pregabalin]; Omnipaque [iohexol]; Prozac [fluoxetine hcl]; Sulfa (sulfonamide antibiotics); and Topiramate    CURRENT MEDICATIONS    Current Outpatient Medications:     Amlodipine (NORVASC) 10 mg Tablet, Take 1 tablet by mouth every day., Disp: 90 tablet, Rfl: 3    Atorvastatin (LIPITOR) 10 mg Tablet, Take 1 tablet by mouth every day at bedtime., Disp: 90 tablet, Rfl: 0    Blood Sugar Diagnostic (ONETOUCH ULTRA TEST) Strips, Use to test blood glucose 3x/day, Disp: 300 strip, Rfl: 11    empagliflozin (JARDIANCE) 25 mg Tablet, Take 1 tablet by mouth every day., Disp: 90 tablet, Rfl: 3    FLUTICASONE/SALMETEROL (ADVAIR DISKUS INHA), Take  by inhalation., Disp: , Rfl:     GlipiZIDE (GLUCOTROL XL) 10 mg SR Tablet, Take 2 tablets by mouth every morning with a meal. (diabetes), Disp: 60 tablet, Rfl: 5    Insulin Glargine (LANTUS SOLOSTAR U-100 INSULIN) 100 unit/mL (3 mL) Pen, Inject 22 units every night before bed. Please supply 90 day supply., Disp: 30 mL, Rfl: 2    Insulin Pen Needles, Disposable, (BD ULTRA-FINE III SHORT PEN NEEDLE) 31 gauge x 5/16", Use for injecting insulin 1 time per day, Disp: 100 each, Rfl: 11    Losartan (COZAAR) 25 mg Tablet, Take 1 tablet by mouth every day., Disp: 90 tablet, Rfl: 3    Metformin (GLUCOPHAGE) 1,000 mg tablet, Take 1 tablet by mouth 2 times daily with meals. (diabetes), Disp: 180 tablet, Rfl: 3    Omeprazole (PRILOSEC) 20 mg Delayed Release Capsule, TAKE 1 CAPSULE BY MOUTH TWICE DAILY BEFORE MEALS, Disp: 180 capsule, Rfl: 1    Ondansetron (ZOFRAN) 8 mg  Tablet, Take 1 tablet by mouth every 8 hours if needed., Disp: 270 tablet, Rfl: 3    Venlafaxine ([name]) 75 mg Tablet, TAKE 2 TABLETS BY MOUTH EVERY MORNING AND TAKE 1 TABLET BY MOUTH EVERY EVENING, Disp: 270 tablet, Rfl: 1        >>>PHYSICAL EXAMINATION  Temp: 36.6 C (97.8 F) (03/04 1025)  Temp src: Oral (03/04 1025)  Pulse: 75 (03/04 1025)  BP: 143/84 (03/04 1028)  Resp: 16 (03/04 1025)  SpO2: 94 % (03/04  1025)  Height: 157.5 cm (5' 2") (03/04 1025)  Weight: 78.2 kg (172 lb 6.4 oz) (03/04 1025)    General Appearance: healthy, alert, no distress, pleasant affect, cooperative.  Eyes: conjunctivae and corneas clear. PERRL, EOM's intact. sclerae normal.  Mouth: normal.  Neck: Neck supple.   Heart: normal rate and regular rhythm, no murmurs, clicks, or gallops.  Lungs: clear to auscultation.  Abdomen: soft, non-tender. No masses or organomegaly.  Neuro: Gait normal. Strength grossly normal.  Mental Status: interacts appropriately, good eye contact    LABORATORY     Patient Active Problem List   WHITE BLOOD CELL COUNT (K/MM3)   Date Value   08/30/2015 8.9     White Blood Cell Count (K/MM3)   Date Value   06/29/2017 10.9     HEMOGLOBIN (g/dL)   Date Value   08/30/2015 14.4     Hemoglobin (g/dL)   Date Value   06/29/2017 12.7     MCV   Date Value   06/29/2017 78.7 fL   08/30/2015 83.6 UM3     MCHC (%)   Date Value   06/29/2017 30.9   08/30/2015 32.3     RDW   Date Value   06/29/2017 17.4 %   08/30/2015 15.6 UNITS     PLATELET COUNT (K/MM3)   Date Value   08/30/2015 217     Platelet Count (K/MM3)   Date Value   06/29/2017 299     SODIUM (mEq/L)   Date Value   08/30/2015 142     Sodium (mmol/L)   Date Value   06/29/2017 142     POTASSIUM (mEq/L)   Date Value   08/30/2015 4.0     Potassium (mmol/L)   Date Value   06/29/2017 3.8     CHLORIDE (mEq/L)   Date Value   08/30/2015 104     Chloride (mmol/L)   Date Value   06/29/2017 103     CARBON DIOXIDE TOTAL (mEq/L)   Date Value   08/30/2015 22     Carbon Dioxide Total  (mmol/L)   Date Value   06/29/2017 25     CREATININE BLOOD (mg/dL)   Date Value   08/30/2015 0.88     Creatinine Serum (mg/dL)   Date Value   06/29/2017 1.15     UREA NITROGEN, BLOOD (BUN) (mg/dL)   Date Value   08/30/2015 19     Urea Nitrogen, Blood (BUN) (mg/dL)   Date Value   06/29/2017 17       -pcp Dr Sunny Schlein to get a repeat scope    >>>IMPRESSION  1. 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       Gastroparesis  3. Nausea and abdominal pain  4.         Left adrenal gland adenoma, stable  5.         Other multiple medical co-morbiditiesincluding type II diabetes, obesity and NAFLD     DISCUSSION  Paula Daugherty is a very pleasant 72yrold female with multiple medical co-morbidities who was incidentally found to have grade 1 well differentiated neuroendocrine tumor of duodenum with Ki67 < 2%. Duodenal neuroendocrine tumors are relatively rare and comprise only2-3% of all GI endocrine tumors. The majority of duodenal NET are nonfunctional and indolent with benign progression. It sometimes could cause Zollinger-K[place] syndrome and other clinical hormonal syndromes such as Cushing. Patient's normal gastrin level ruled out ZES.  Patient has localized disease, and complete resection of the tumor would be the primary treatment. She underwent incomplete resection via polypectomy in Oct 2017. Repeat scan and scope revealed no radiographic or pathologic evidence of disease.  Will recommend endoscopic surveillance and consider biochemical marker and scan as clinically indicated.  Note patient also has diabetes and gastroparesis which may be contributing to her GI symptoms rather than the neuroendocrine tumor.  Patient and family verbalized understanding of the plan and agree to proceed.    REVIEW OF RECOMMENDATIONS  1.      Diagnostics:   -surveillance - could repeat scope in a year or as  clinically indicated.  No indication for repeat DOTATATE scan               -routine colonoscopy if not done yet    2.     Therapy:   -surveillance as above    3.       Followup:     -RTC prn    Side effects of above therapy were discussed at length and patient acknowledged risks and benefits associated with treatment.      Thank you for allowing me to participate in the care of the patient.   Total time of this office visit was 32 minutes.  Greater than 50% of the time was spent discussing the patient's disease, prognosis, symptoms, and treatment plan.     Sincerely,    Electronically signed by:  Marlow Baars, MD, [place]  Hematology Oncology Division  Pager: [phone]

## 2017-07-06 NOTE — Telephone Encounter (Signed)
Pt last seen by PCP 06/29/17

## 2017-07-07 ENCOUNTER — Other Ambulatory Visit: Payer: Self-pay

## 2017-07-07 NOTE — Progress Notes (Signed)
Ambulatory Case Management, Health Management and Education    Reviewed pertinent portions of patient chart. Patient is noted to have engaged with case management in the past.     Sent mychart message in initial outreach attempt and included resource information for connecting with a therapist who accepts [employer].     Mauro Kaufmann, LCSW  Social Work Case Manager  Ambulatory Case Management  [phone]

## 2017-07-13 ENCOUNTER — Other Ambulatory Visit: Payer: Self-pay | Admitting: GASTROENTEROLOGY

## 2017-07-13 DIAGNOSIS — K3184 Gastroparesis: Secondary | ICD-10-CM

## 2017-07-14 ENCOUNTER — Ambulatory Visit: Payer: Medicare Other | Admitting: Family Medicine

## 2017-07-14 ENCOUNTER — Encounter: Payer: Self-pay | Admitting: Family Medicine

## 2017-07-14 VITALS — BP 146/78 | HR 78 | Temp 98.0°F | Resp 20 | Wt 173.7 lb

## 2017-07-14 DIAGNOSIS — F330 Major depressive disorder, recurrent, mild: Secondary | ICD-10-CM

## 2017-07-14 DIAGNOSIS — R05 Cough: Secondary | ICD-10-CM

## 2017-07-14 DIAGNOSIS — R229 Localized swelling, mass and lump, unspecified: Secondary | ICD-10-CM | POA: Insufficient documentation

## 2017-07-14 DIAGNOSIS — E119 Type 2 diabetes mellitus without complications: Secondary | ICD-10-CM

## 2017-07-14 DIAGNOSIS — R059 Cough, unspecified: Secondary | ICD-10-CM

## 2017-07-14 DIAGNOSIS — I10 Essential (primary) hypertension: Secondary | ICD-10-CM

## 2017-07-14 NOTE — Patient Instructions (Signed)
Please see about possibly getting Shinrix from outside pharmacy--repeat due after 2 mos with 1 booster to complete series of 2.

## 2017-07-14 NOTE — Nursing Note (Signed)
Patient roomed, chief complaint noted, allergies verified, blood pressure, pulse, respiration, temperature, and weight obtained, screened for pain, and pharmacy verified.  Cordelro Gautreau, M.A.

## 2017-07-14 NOTE — Progress Notes (Signed)
Paula Daugherty is a 52yrfemale who presents with a chief complaint of "still having a cough but it is better".    Chief Complaint   Patient presents with    Cough     f/u       1. Cough: slowly seeming to resolve since onset early part of February; recent CXR reviewed with patient and it seems a post viral type cough   2. DM2 and HTN: A1c at goal; blood pressure seeming above goal for DM2   3. Stressors at home: mother seeming unreasonable; daughter not speaking to patient; seeking counseling but prefers to be seen here with Lauren; previous PHQ9 over 10; no SI or HI; happy today because she paid off house        ROS:   .Constitutional: no fever/chills.  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 90 tablet 3    Atorvastatin (LIPITOR) 10 mg Tablet Take 1 tablet by mouth every day at bedtime. 90 tablet 1    Blood Sugar Diagnostic (ONETOUCH ULTRA TEST) Strips Use to test blood glucose 3x/day 300 strip 11    empagliflozin (JARDIANCE) 25 mg Tablet Take 1 tablet by mouth every day. 90 tablet 3    FLUTICASONE/SALMETEROL (ADVAIR DISKUS INHA) Take  by inhalation.      GlipiZIDE (GLUCOTROL XL) 10 mg SR Tablet Take 2 tablets by mouth every  morning with a meal. (diabetes) 60 tablet 5    Insulin Glargine (LANTUS SOLOSTAR U-100 INSULIN) 100 unit/mL (3 mL) Pen Inject 22 units every night before bed. Please supply 90 day supply. 30 mL 2    Insulin Pen Needles, Disposable, (BD ULTRA-FINE III SHORT PEN NEEDLE) 31 gauge x 5/16" Use for injecting insulin 1 time per day 100 each 11    Losartan (COZAAR) 25 mg Tablet Take 1 tablet by mouth every day. 90 tablet 3    Metformin (GLUCOPHAGE) 1,000 mg tablet Take 1 tablet by mouth 2 times daily with meals. (diabetes) 180 tablet 3    Omeprazole (PRILOSEC) 20 mg Delayed Release Capsule TAKE 1 CAPSULE BY MOUTH TWICE DAILY BEFORE MEALS 180 capsule 1    Ondansetron (ZOFRAN) 8 mg Tablet Take 1 tablet by mouth every 8 hours if needed. 270 tablet 3    Venlafaxine ([name]) 75 mg Tablet TAKE 2 TABLETS BY MOUTH EVERY MORNING AND TAKE 1 TABLET BY MOUTH EVERY EVENING (Patient taking differently: Take 150 mg by mouth every day. TAKE 2 TABLETS BY MOUTH  EVERY MORNING AND TAKE 1 TABLET BY MOUTH EVERY EVENING          ) 270 tablet 1     No current facility-administered medications on file prior to visit.      Allergies:   Allergies   Allergen Reactions    Contrast Dye [Radiopaque Agent] Hives    Hydrochlorothiazide Other-Reaction in Comments     Patient reports    Januvia [Sitagliptin] Other-Reaction in Comments     Patient reports    Lyrica [Pregabalin] Other-Reaction in Comments     Patient reports    Omnipaque [Iohexol] Other-Reaction in Comments     Patient reports    Prozac [Fluoxetine Hcl] Other-Reaction in Comments     Patient reports    Sulfa (Sulfonamide Antibiotics) Rash    Topiramate Other-Reaction in Comments     Hairfall       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 [employer]    Social  Needs    Financial resource strain: Not on file    Food insecurity:     Worry: Not on file     Inability: Not on file    Transportation needs:     Medical: Not on file     Non-medical: Not on file   Tobacco Use    Smoking status: Former Smoker     Packs/day: 0.10     Years: 20.00     Pack years: 2.00    Smokeless tobacco: Never Used    Tobacco comment: quit 30 years ago   Substance and Sexual Activity    Alcohol use: Yes     Alcohol/week: 0.0 oz     Comment: rare shares a beer or glass wine every 2-3 months     Drug use: No    Sexual activity: Yes     Partners: Male   Lifestyle    Physical activity:     Days per week: Not on file     Minutes per session: Not on file    Stress: Not on file   Relationships    Social connections:     Talks on phone: Not on file     Gets together: Not on file     Attends religious service: Not on file     Active member of club or organization: Not on file     Attends meetings of clubs or organizations: Not on file     Relationship status: Not on file    Intimate partner violence:     Fear of current or ex partner: Not on file     Emotionally abused: Not on file     Physically abused: Not on file     Forced sexual activity: Not on file   Other Topics Concern    Not on file   Social History Narrative    Not on file     Family History   Problem Relation Name Age of Onset    Diabetes Father      Heart Mother          MI at 74s     Non-contributory Brother      Non-contributory Brother  PE:  BP 146/78 (SITE: right arm, Orthostatic Position: sitting, Cuff Size: regular)   Pulse 78   Temp 36.7 C (98 F) (Temporal)   Resp 20   Wt 78.8 kg (173 lb 11.6 oz)   LMP 05/06/1983   SpO2 95%   BMI 31.77 kg/m   .General Appearance: healthy, alert, no distress, pleasant affect, cooperative.  Heart:  normal rate and regular rhythm, no murmurs, clicks, or gallops.  Lungs: clear to auscultation.  Extremities:  no cyanosis, clubbing, or edema.  Mental Status: appropriate  affect, behavior and mentation   Musculoskeletal: smooth and mobile 2x2xm area at left upper arm distally without tenderness to palpation     Assessment and Plan:  (I10) Essential hypertension  (primary encounter diagnosis)  Comment: recheck per above; seems over 130/80 for goal with DM2  Plan: to recheck in 6 wks     (R05) Cough  Comment: seeming almost resolved   Plan: to monitor     (E11.9) Type 2 diabetes mellitus without complication, without long-term current use of insulin ([place])  Comment: at goal   Plan: to monitor     (R22.9) Subcutaneous nodule  Comment: at left arm; lipoma possible since she may have other lesions reported; patient declined ultrasound    Plan: to monitor     (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 No guarantees were made regarding her medical care or treatment outcome. Barriers to Learning: none.  Patient verbalizes understanding of teaching and instructions.    Electronically signed by:    [name], MD  [place], [place] Board of Family Medicine  Associate physician [facility], [place]   [phone]

## 2017-07-16 ENCOUNTER — Encounter: Payer: Self-pay | Admitting: Family Medicine

## 2017-07-16 NOTE — Telephone Encounter (Signed)
From: [name]  To: [name], MD  Sent: 07/15/2017 3:45 PM PDT  Subject: Referral Question    Hu Dr, just wanted you to know that the referral you did for counseling for me called this morning. I am pleased to tell you that I have an appointment for Wednesday of next. I thought that was fast so I was very pleased. Thanks for your help and support.

## 2017-07-22 ENCOUNTER — Ambulatory Visit: Payer: Medicare Other

## 2017-07-22 DIAGNOSIS — F331 Major depressive disorder, recurrent, moderate: Secondary | ICD-10-CM

## 2017-07-22 DIAGNOSIS — F329 Major depressive disorder, single episode, unspecified: Secondary | ICD-10-CM

## 2017-07-22 DIAGNOSIS — F419 Anxiety disorder, unspecified: Secondary | ICD-10-CM
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
## 2017-07-22 NOTE — Patient Instructions (Addendum)
Homework:  - Between today and next Wednesday, aim to get dressed after you wake up on three separate days.  Notice if you feel any different on those days (a journal is a great way to make note of these changes and how they make you feel).      - Additionally, begin to think about what a simple morning routine can look like for you.  (e.g. Get dressed, have a cup of coffee, read the newspaper for 20 minutes, watch a show, etc.)

## 2017-07-22 NOTE — Progress Notes (Signed)
[facility]         PATIENT INFORMATION  i       Patient: Paula Daugherty  Medical Record Number:  [id number]   Date of Birth:  23-Nov-1945   Date of Visit:  07/22/2017     Type of Visit:  Psychotherapy Intake   Time:  50 minutes    CPT Code:  54008     PSYCHOTHERAPY NOTE (SOAPE)  i         SUBJECTIVE  i     Identifying Information:    Paula Daugherty is a 72yrold female referred by JJamelle Haring MD for counseling related to depression and anxiety surrounding family issues.    OBJECTIVE  c     Mental Status Exam  General Observations         Appearance  [x]  Well groomed  []  Unkempt  []  Discheveled      Build  [x]  Average  []  Thin  []  Overweight      Demeanor  [x]  Average  []  Hostile  []  Mistrustful  []  Withdrawn  []  Preoccupied  []  Demanding   Eye contact  [x]  Average  []  Avoidant  []  Intense      Motor activity  [x]  Average  []  Agitated  []  Slowed      Speech  [x]  Clear  []  Slurred  []  Rapid  []  Pressured     Thought Content         Delusions   [x]  None reported  []  Grandiose  []  Persecutory  []  Somatic  []  Bizarre  []  Nihilistic  []  Religious   Other   [x]  None reported  []  Preoccupied  []  Obsessional  []  Phobic  []  Guilty  []  Guarded  []  Ideas of reference   Self-abuse   [x]  None reported  []  Passive suicidal ideation   []  Active suicidal ideation  []  Intent  []  Plan  []  Means  []  Self-mutilation   Harm to others   [x]  None reported  []  Aggression towards others (thoughts only)  []  Intent to harm others  []  Plan to hurt others      Perception         Hallucinations   [x]  None reported  []  Auditory  []  Visual  []  Olfactory  []  Gustatory  []  Tactile    Other   [x]  None reported  []  Illusions  []  Depersonalization  []  Derealization      Thought Process           [x]  Logical  []  Circumstantial  []  Tangential  []  Loose  []  Racing  []  Incoherent     []  Concrete  []  Blocked  []  Flight of ideas      Mood           [x]  Euthymic  [x]  Depressed  []  Anxious  []  Angry  []  Euphoric   []  Irritable   Affect           [x]  Full  []  Constricted  []  Flat  []  Inappropriate  []  Labile    Behavior           [x]  Cooperative  []  Resistant  []  Agitated  []  Impulsive  []  Over sedated  []  Assaultive     []  Aggressive  []  Hyperactive  []  Restless  []  Loss of interest  []  Anhedonia  []  Akasthisia     []  Withdrawn  []  Dystonia  []   Tardive Dyskinesia      Cognition         Impairment of:   [x]  None reported  []  Orientation  []  Memory  []  Attention/ concentration  []  Ability to abstract     Insight/Judgment           []  Good  [x]  Fair  []  Poor        Psychotherapy content:  Confirmed patient's understanding of and participation in [facility].  Discussed informed consent for [facility], explaining face-to-face, remote, and collaboration meeting minutes are billable and subject to Medicare copays and cost sharing.  Discussed limits of confidentiality including mandated reporting of imminent danger to self, imminent danger to others, evidence of child abuse, evidence of elder abuse.  Patient was given the opportunity to ask questions, and verbally indicated an understanding of the topics above.  Clinician completed a brief cognitive functioning assessment screening with patient, and patient completed PHQ-9 and GAD-7 upon arrival to the clinic (see scores below).    Clinician provided active listening as patient processed feelings while discussing presenting problems, current psychiatric medications, past mental health treatment, substance use history, major medical history, legal history, spiritual beliefs, and patient's self-reported strengths.  Clinician assessed for suicide risk.  Discussed patient's goals for therapy.    History of presenting problem/chief complaint:  Client has experienced a long history of family turmoil.  Most recently, within the last year, has experienced a great deal of emotional distress due to complications in the relationships  with her mother and her daughter.      Current psychiatric medications:  Currently taking [name] (depression) which she states seems to work well.  States she notices she gets irritable if she forgets to take it.  Patient reports she is open to exploring other medications if it seems beneficial.    Mental health treatment/psychiatric history:   Previous outpatient psychiatric medications:  Has been on "other antidepressants" in the past.     Previous inpatient mental health hospitalizations and medications:  None noted.     Current and previous suicidal ideation/attempt history:  No suicidal ideation.     Family psychiatric history:  Potential history of personality disorders in family.    Current and previous substance use history:     Tobacco:  She  reports that she has quit smoking. She has a 2.00 pack-year smoking history. She has never used smokeless tobacco.     Alcohol:  She  reports that she drinks alcohol.     Cannabis:  None noted.   Other:  None noted.    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       Additional major medical history:  Currently cancer-free, but described an upcoming appointment regarding cancer-related check up.    Medications:  Outpatient Medications Prior to Visit   Medication Sig Dispense Refill  Amlodipine (NORVASC) 10 mg Tablet Take 1 tablet by mouth every day. 90 tablet 3    Atorvastatin (LIPITOR) 10 mg Tablet Take 1 tablet by mouth every day at bedtime. 90 tablet 1    Blood Sugar Diagnostic (ONETOUCH ULTRA TEST) Strips Use to test blood glucose 3x/day  300 strip 11    empagliflozin (JARDIANCE) 25 mg Tablet Take 1 tablet by mouth every day. 90 tablet 3    FLUTICASONE/SALMETEROL (ADVAIR DISKUS INHA) Take  by inhalation.      GlipiZIDE (GLUCOTROL XL) 10 mg SR Tablet Take 2 tablets by mouth every morning with a meal. (diabetes) 60 tablet 5    Insulin Glargine (LANTUS SOLOSTAR U-100 INSULIN) 100 unit/mL (3 mL) Pen Inject 22 units every night before bed. Please supply 90 day supply. 30 mL 2    Insulin Pen Needles, Disposable, (BD ULTRA-FINE III SHORT PEN NEEDLE) 31 gauge x 5/16" Use for injecting insulin 1 time per day 100 each 11    Losartan (COZAAR) 25 mg Tablet Take 1 tablet by mouth every day. 90 tablet 3    Metformin (GLUCOPHAGE) 1,000 mg tablet Take 1 tablet by mouth 2 times daily with meals. (diabetes) 180 tablet 3    Omeprazole (PRILOSEC) 20 mg Delayed Release Capsule TAKE 1 CAPSULE BY MOUTH TWICE DAILY BEFORE MEALS 180 capsule 1    Ondansetron (ZOFRAN) 8 mg Tablet Take 1 tablet by mouth every 8 hours if needed. 270 tablet 3    Venlafaxine ([name]) 75 mg Tablet TAKE 2 TABLETS BY MOUTH EVERY MORNING AND TAKE 1 TABLET BY MOUTH EVERY EVENING (Patient taking differently: Take 150 mg by mouth every day. TAKE 2 TABLETS BY MOUTH EVERY MORNING AND TAKE 1 TABLET BY MOUTH EVERY EVENING          ) 270 tablet 1     No facility-administered medications prior to visit.        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[place] [employer] strain: Not on file    Food insecurity:     Worry: Not on file      Inability: Not on file    Transportation needs:     Medical: Not on file     Non-medical: Not on file   Tobacco Use    Smoking status: Former Smoker     Packs/day: 0.10     Years: 20.00     Pack years: 2.00    Smokeless tobacco: Never Used    Tobacco comment: quit 30 years ago   Substance and Sexual Activity    Alcohol use: Yes     Alcohol/week: 0.0 oz     Comment: rare shares a beer or glass wine every 2-3 months     Drug use: No    Sexual  activity: Yes     Partners: Male   Lifestyle    Physical activity:     Days per week: Not on file     Minutes per session: Not on file    Stress: Not on file   Relationships    Social connections:     Talks on phone: Not on file     Gets together: Not on file     Attends religious service: Not on file     Active member of club or organization: Not on file     Attends meetings of clubs or organizations: Not on file     Relationship status: Not on file    Intimate partner violence:     Fear of current or ex partner: Not on file     Emotionally abused: Not on file     Physically abused: Not on file     Forced sexual activity: Not on file   Other Topics Concern    Not on file   Social History Narrative    Not on file       Additional Social History:  57 year old grandson.    Legal history:    Patient reports no history of arrests or incarcerations.    Self-reported strengths:  Good friend, loyal to others, caring.    Therapeutic modalities used:   Clinician provided active listening and motivational interviewing techniques during this session.  Described the importance of behavioral activation and provided psychoeducation regarding depression.    ASSESSMENT  i     Session summary:  Patient is a 72 year old female seeking counseling for depression.  She is married, and feels supported by her husband.  Patient is currently struggling with the relationships with her daughter and her mother, which are causing hear a great deal of emotional turmoil.    It appears patient has  a history of depressive disorders.  It appears she is currently experiencing an episode of major depressive disorder, moderate, as she reports symptoms experienced daily for around a year, which make it difficult to engage in her every day life.      Medication questions:  Feels like [name] is working well, but would also be open to exploring other medications if it would be beneficial.    07/22/2017 PHQ-9 score:  15  07/22/2017 GAD-7 score:  7  07/22/2017 Six-question cognitive functioning assessment score:  6  (*Severe cognitive impairment equals a score of less than 3 out of 6.)       PLAN  i        Will review patient case, medication questions during CoCM IDT.    Therapeutic focus will include:  Boundary setting with her family; Grief work.   Clinician will follow up next session regarding homework; Involving her husband in certain aspects of therapy.    Homework given:    - Between today and next Wednesday, aim to get dressed after you wake up on three separate days.  Notice if you feel any different on those days (a journal is a great way to make note of these changes and how they make you feel).    - Additionally, begin to think about what a simple morning routine can look like for you.  (e.g. Get dressed, have a cup of coffee, read the newspaper for 20 minutes, watch a show, etc.)     07/22/2017 Session I of VIII.  Will continue with weekly individual psychotherapy.        EDUCATION  i  Provided patient psychoeducation regarding the cycle of depression and the importance of behavioral activation, and starting with slow, small changes.  Discussed involving her husband within sessions for behavioral activation support.               Kathrene Bongo, [place] [facility]  [facility]

## 2017-07-23 ENCOUNTER — Telehealth: Payer: Self-pay | Admitting: Clinical

## 2017-07-23 ENCOUNTER — Other Ambulatory Visit: Payer: Self-pay

## 2017-07-23 NOTE — Progress Notes (Signed)
Ambulatory Case Management, Health Management and Education    Reviewed pertinent portions of patient chart. Received incoming message from Ms. Aye that she has established with Lauren through [facility] and will be continuing to meet with her for therapy. Goals met for case management. Currently will return to regular care with PCP. Patient can be re-referred if indicated in the future.     Mauro Kaufmann, LCSW  Social Work Case Manager  Ambulatory Case Management  [phone]

## 2017-07-23 NOTE — Telephone Encounter (Signed)
Patient couldn't find this doctor listed on her mychart to message you but she wanted to let you know that she will be bringing her husband to the next appointment.

## 2017-07-29 ENCOUNTER — Ambulatory Visit: Payer: Medicare Other

## 2017-07-29 ENCOUNTER — Ambulatory Visit: Payer: Medicare Other | Attending: Adult Health | Admitting: Adult Health

## 2017-07-29 ENCOUNTER — Encounter: Payer: Self-pay | Admitting: Adult Health

## 2017-07-29 ENCOUNTER — Other Ambulatory Visit: Payer: Self-pay

## 2017-07-29 VITALS — BP 128/77 | HR 75 | Temp 98.0°F | Ht 62.0 in | Wt 173.7 lb

## 2017-07-29 DIAGNOSIS — Z86010 Personal history of colonic polyps: Secondary | ICD-10-CM | POA: Insufficient documentation

## 2017-07-29 DIAGNOSIS — R140 Abdominal distension (gaseous): Secondary | ICD-10-CM | POA: Insufficient documentation

## 2017-07-29 DIAGNOSIS — K5909 Other constipation: Secondary | ICD-10-CM | POA: Insufficient documentation

## 2017-07-29 DIAGNOSIS — F3289 Other specified depressive episodes: Secondary | ICD-10-CM

## 2017-07-29 DIAGNOSIS — F419 Anxiety disorder, unspecified: Secondary | ICD-10-CM

## 2017-07-29 DIAGNOSIS — D3A8 Other benign neuroendocrine tumors: Secondary | ICD-10-CM

## 2017-07-29 DIAGNOSIS — K3184 Gastroparesis: Secondary | ICD-10-CM

## 2017-07-29 DIAGNOSIS — F329 Major depressive disorder, single episode, unspecified: Secondary | ICD-10-CM

## 2017-07-29 DIAGNOSIS — D3A010 Benign carcinoid tumor of the duodenum: Secondary | ICD-10-CM | POA: Insufficient documentation

## 2017-07-29 DIAGNOSIS — R143 Flatulence: Secondary | ICD-10-CM | POA: Insufficient documentation

## 2017-07-29 LAB — VENIPUNCTURE ONLY, STUDY

## 2017-07-29 MED ORDER — LINACLOTIDE 145 MCG CAPSULE
145.0000 ug | ORAL_CAPSULE | Freq: Every day | ORAL | 0 refills | Status: DC
Start: 2017-07-29 — End: 2018-04-20

## 2017-07-29 MED ORDER — METOCLOPRAMIDE 5 MG TABLET
5.0000 mg | ORAL_TABLET | Freq: Three times a day (TID) | ORAL | 0 refills | Status: DC
Start: 2017-07-29 — End: 2020-08-14

## 2017-07-29 MED ORDER — MAGNESIUM CITRATE ORAL SOLUTION
ORAL | 0 refills | Status: DC
Start: 2017-07-29 — End: 2018-04-20

## 2017-07-29 NOTE — Progress Notes (Signed)
DOS: 07/29/2017    Paula Daugherty  MRN: [id number]  DOB: Mar 13, 1946    GI Consultation requested by:   [name], MD  [address]  [place] [address]    Subjective: Paula Daugherty is a 72yrold female w/ hx of adrenal nodule, hematuria, deperssion/anxiety, DM2, NASH, fibromyalgia, GERD, HTN, HLD, liver fibrosis, constipation, and NET of duodenum, who presents for follow up gastroparesis.    [facility], and NASH as below. She was referred to motility clinic to discuss mgmt of gastroparesis since she is on Reglan (w/ adequate response) and concern over long term use and potential tardive dyskinesia.  Last GI Clinic visit, 10/11/2106, the impression/plan was:  Assessment and Plan:  1. NASH: based on Fibroscan, she has F3 disease.  She will continue modify her diet and meets regularly with a dietician to discuss.  2. Gastroparesis confirmed by gastric emptying study 06/2016: she had symptomatic relief using Reglan, but we cannot continue this long term because of the risk for tardive dyskinesia.  Overall she is managing fine with zofran as needed for nausea.  We did discuss gastric pacemaker, but she does not feel that her symptoms were bad enough to warrant this referral.  3. Carcinoid: no evidence of residual disease, follow up with oncology    Note: Pt's husband present for clinic visit.    HPI:   She presents to GI Clinic for gastroparesis. Previously followed in GI by Dr CVirgel Bouquet who had her on Reglan 10 mg BID (pt taking after meals) for symptom mgmt but recommended Motility Clinic consult to discuss other options for long term mgmt of gastroparesis.   While she was taking Reglan, symptoms improved and she did not experience adverse effect; however, she only took the med for 2 wks out of concern for tardive dyskinesia. Now, she doesn't take it unless 'abdominal pain is really bad.' Has not tried 5 mg tabs and has not tried taking med before  meals, as directed.    Diet: fruits, minimal meat, raw carrots, rarely vegs. Trying to follow diabetic diet.  Has upper abd discomfort (aching). Occurs randomly. Non radiating. Occurs several times a week. Occurs day and night. Can last up to 1 hour at most. Often accompanied by nausea - takes Zofran and helps. + belching. Can have nausea with or without food. No vomiting for past 1-2 months.   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 Most recent EGD 04/14/17 for surveillance revealing no evidence of tumor on path from duodenal bxs.    She has a BM every 2-3 days. (usual habit)+ fecal urgency. + bloating. + gas   Often w/ pebble-like stools. Straining w/ defecation. Not taking fiber or Miralax. "It's just a way of life." Tried: MOM, Miralax, ExLax, Dulcolax, suppositories, Mag Citrate  Not tried: Linzess, Amitiza, Trulance,   Has not had anorectal manometry to assess for PFD.   No blood noted in stool. No wt loss. No diarrhea. No family hx crc  Colonoscopy 01/2014 - TA polyp (10 mm in size,flat, located in the cecum, removed piecemeal by snare cautery and retrieved for pathology); erosion; hemorrhoids; tics; repeat x 3 yrs. Per pathologist comment: Due to the fragmented nature of the specimen, completeness of excision cannot be confirmed.    Pertinent Diagnostic Exams:   04/14/2017 EGD  Hx of duodenal NET  Findings and Interventions:   Esophageal mucosa normal  Esophageal-gastric junction  localized at 35 cm.  Squamo-columnar junction regular  Hiatal hernia absent  Gastric mucosa notable for linear areas of erythema in antrum. Biopsies taken with cold forceps and sent for pathology.  Duodenal mucosa notable for a prominent fold in the second portion. Multiple biopsies taken with cold forceps and sent for pathology.  Retroflexed view normal  Impression:   1. A prominent fold was found in the second portion of the duodenum at the site of previous carcinoid.  This may be benign granulation tissue, but biopsies were  taken to confirm.  2. Mild antral erythema concerning for gastritis was also noted in the stomach.   A.        DUODENUM, 2ND PORTION (BIOPSY):  -   Duodenal mucosa with no significant histopathologic changes  -   No neuroendocrine tumor seen  -   Deeper levels examined             B.        STOMACH, ANTRUM (BIOPSY):  -   Antral gastric mucosa with no significant histopathologic changes       04/08/2017 CT ABDOMEN + PELVIS UROGRAM  COMPARISON: 12/03/2016  INDICATION: Signs/Symptoms:  History of adrenal nodule and needs further workup for hematuria  IMPRESSION:  1. No cause for hematuria. No urolithiasis, renal mass, or  uroepithelial mass in the kidneys, ureters, or urinary bladder.  2. No hydronephrosis or hydroureter.  3. Unchanged 1.8 cm left adrenal adenoma.     02/26/2017: Nuclear Medicine Ga-68 DOTATATE PET/CT Localization Imaging  COMPARISON: 02/28/2016  CORRELATIVE IMAGING: CT abdomen and pelvis 12/03/2016  INDICATION: 72 year old female with history of well-differentiated  carcinoid tumor found on duodenal polypectomy performed on 02/12/2016  during a routine EGD. Patient presents with persistent nausea.  IMPRESSION:  1. No definite focus of uptake suspicious for tumor.   2. There are scattered, nonspecific areas of radiotracer uptake within  the second and third portions of the duodenum as described above, without  CT correlate. This finding is nonspecific, tumor cannot be entirely  excluded. However, attention on follow-up is recommended. Please note in  the clip is no longer visualized in the current study.  3. Probable left adrenal adenoma. If there is clinical concern  dedicated MRI or CT with adrenal protocol is recommended.     07/01/2016: Nuclear Medicine solid gastric retention scintigraphy  COMPARISON:  CT abdomen and pelvis 12/28/2015.  INDICATION:  72 year old female to evaluate for gastroparesis in the  setting of persistent nausea and vomiting.   FINDINGS:   The stomach is normally located in the  left upper quadrant.  Gastric  retention at 2 hours is 49.82 %, which is normal.  Gastric retention at 4  hours is 17.15 %, which is elevated.      The normal range of solid gastric retention for this imaging protocol is  less than or equal to 60% at 2 hours, and less than or equal to 10% at 4  hours.  Please note that this imaging protocol is optimized for detection  of elevated gastric retention; it does not assess for the presence of  accelerated gastric emptying.     IMPRESSION:   1. Normal solid gastric retention of 49.82 % at 2 hours.  2. Elevated solid gastric retention of 17.15 % at 4 hours.     06/10/2016 EGD  GI Pre-procedure Indications:  History of duodenal carcinoid, here for surveillance EGD.  PET scan was recently negative.  Findings and Interventions:  Esophageal mucosa normal  Esophageal-gastric junction localized at 35 cm.  Squamo-columnar junction regular  Hiatal hernia absent  Gastric mucosa mild erythema at the gastric antrum is noted.  Duodenal mucosa the previous resection site is seen in the second portion of the duodenum.  Hemoclip is seen in place.  At the base of the hemoclip is a small bumpy raised area is seen.  This area was biopsied multiple times and the area was marked with spot tattoo.  Retroflexed view normal    Impression:   1. Site of previous carcinoid was seen and biopsied.  The small bumpy area may represent residual carcinoid or benign granulation tissue from tissue healing.  2. Antral gastritis s/p biopsy.  Recommendation/Plan:   Await pathology  Follow up with Dr. Marti Sleigh (oncology)    A.          DUODENUM, 2ND PORTION (BIOPSY) :  - Duodenal mucosa with reactive changes  - No neuroendocrine tumor present in sampled tissue                 B.          STOMACH (BIOPSY) :  - Mild chronic antral/oxyntic gastritis without activity  - No H. Pylori or intestinal metaplasia      02/12/2016 EGD  GI Pre-procedure Indications:  72 year old female with NAFLD and chronic intermittent nausea  and vomiting.  Findings and Interventions:   An oral examination was performed. The Olympus adult gastroscope was inserted into the mouth and advanced under direct vision to the third portion of the duodenum.  In the second part of the duodenum, a 1-1.5 cm polyp was seen.  This was removed by hot snare.  The specimen was too large to be suctioned into the adult gastroscope, and so the scope was withdrawn.  A pediatric colonoscope was then introduced, and using a Jabier Mutton net the specimen was retrieved.  There was some oozing from the site of polypectomy and one hemoclip was placed with good hemostasis.  Appropriate photo documentation was obtained. The patient tolerated the procedure well, and there were no complications. Patient was taken to the recovery area in stable condition.  The rest of the exam is detailed below:    Esophagus: normal appearing.  No varices were seen.  Stomach: mild erythema in the antrum.  Otherwise normal appearing  Duodenum: polyp seen in the second part of the duodenum as detailed above.  Otherwise normal appearing mucosa from the duodenal bulb to the third part of the duodenum.    Retroflexed view: normal appearing mucosa    Impression:   1. 1-1.5 cm duodenal polyp, s/p Snare cautery and hemoclip x1.  2. Mild antral gastritis  3. No evidence of varices.    A.  DUODENUM, POLYP (BIOPSY):       -  Well differentiated neuroendocrine tumor, Grade 1, transected, see         comment       -  Maximum size: 7.5 mm (measured on slide)       -  Ki67 <2%       -  Synaptophysin positive        Comment: This case has been reviewed in intradepartmental consultation.      01/04/2014 Colonoscopy  GI Pre-procedure Indications:  Screening for colon cancer    DIAGNOSIS              Findings and Interventions:    Cecum: polyp(s) -#1, 10 mm in size,flat, located  in the cecum, removed piecemeal by snare cautery and retrieved for pathology  Ascending colon:  normal  Transverse colon: normal  Descending colon: normal  Sigmoid colon: diverticulosis, moderate in degree, involving the sigmoid  Rectum: hemorrhoids external small in size  left undisturbed  Single clean based ulcer at the anorectal junction  ~46m, biopsy taken, minimal oozing.     Impression:  1) A single benign appearing colon polyp, removed as above.  2) Colonic diverticulosis as described above  3) External hemorrhoids as described above.  4) Clean based, single rectal ulcer- suspect solitary ulcer due to constipation. Biopsies taken to rule out infectious    Recommendation/Plan:  1) Await pathology  2) Repeat colonoscopy in 3 years or sooner ( if significant dysplasia)   3) Aggressive management of constipation - start daily fiber and miralax  4) GI clinic follow up      A.  CECUM, POLYP (SNARE POLYPECTOMY):       -  Fragments of tubular adenoma (see Comment)       -  No high-grade dysplasia or malignancy       COMMENT: (Part A): Due to the fragmented nature of the specimen, completeness of excision cannot be confirmed.   B.  RECTUM, ULCER (BIOPSY):       -  Colonic mucosa with focal erosion and reactive epithelial changes       -  No dysplasia or malignancy       -  Deeper levels examined     ROS:  A 14-point review of systems was done, and was negative, except as noted    Problem List:   Patient Active Problem List   Diagnosis    DM2 (diabetes mellitus, type 2) ([facility]    Depression with anxiety    Stress at home    Leg cramps-right calf    Right ear pain    Fibromyalgia    HTN (hypertension)    GERD (gastroesophageal reflux disease)    Hyperlipidemia with target LDL less than 70    Vitamin D insufficiency    Abnormal LFTs    Renal cyst ( 6.4 cm cyst left kidney)    Cough    Type 2 diabetes mellitus without complication (HCC)    Fatty liver    Macroalbuminuric diabetic nephropathy (HCC)    History of actinic keratoses    Cataract    Ptosis of eyelid     Liver fibrosis    Adrenal nodule (HCC)    Medication Therapy Auth    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HCC)    Mixed conductive and sensorineural hearing loss of both ears    Neoplasm of uncertain behavior of skin    Abdominal pain, generalized    Nausea without vomiting    Asymptomatic microscopic hematuria    Subcutaneous nodule       Past Medical History:  I reviewed the patient's medical history in the EMR.  Past Medical History:   Diagnosis Date    Anxiety     Asthma     Cir[place]osis ([place]     Depression     Diabetes mellitus ([facility]     Fatty liver     Hypertension     Kidney disease 2015    cyst left kidney per pt    Liver fibrosis     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/20/2016    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[place] 04/17/2016    Psychiatric illness  Past Surgical History:   I reviewed the patient's surgical history in the EMR.  Past Surgical History:   Procedure Laterality Date    COLONOSCOPY  01/04/14    polyp--TA and erosion; hemorrhoids; tics; repeat x 3 yrs    EGD      MAMMOPLASTY, REDUCTION  1985    PHACOEMULSIFICATION, CATARACT Right 02/12/2015    with IOL    PR ESOPHAGOGASTRODUODENOSCOPY TRANSORAL DIAGNOSTIC  02/12/2016    EGD--polyp--path pending; antral gastritis; no varices     PR ESOPHAGOGASTRODUODENOSCOPY TRANSORAL DIAGNOSTIC  06/11/2016    EGD--biopsy at site of carcinoid tumor path pending     PR ESOPHAGOGASTRODUODENOSCOPY TRANSORAL DIAGNOSTIC      EGD--folds in duodenum--path pending     PR ESOPHAGOGASTRODUODENOSCOPY TRANSORAL DIAGNOSTIC  04/2017    EGD; repeat EGD in 1 year     PR KNEE SCOPE,DIAGNOSTIC  1980s    Knee arthroscopy    PR LIGATE FALLOPIAN TUBE      Tubal ligation    PR REPAIR TYMPANIC MEMBRANE      Tympanoplasty    PR TOTAL ABDOM HYSTERECTOMY  1980s    partial; no BSO    REPAIR, BLEPHAROPTOSIS Bilateral 08/06/2015    Blepharoplasty bilateral upper lids       Family History:   I reviewed the patient's family  history in the EMR.  Family History   Problem Relation Name Age of Onset    Diabetes Father      Heart Mother          MI at 31s     Non-contributory Brother      Non-contributory Brother         Social History:   I reviewed the patient's social history in the EMR.  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 [employer] resource strain: Not on file    Food insecurity:     Worry: Not on file     Inability: Not on file    Transportation needs:     Medical: Not on file     Non-medical: Not on file   Tobacco Use    Smoking status: Former Smoker     Packs/day: 0.10     Years: 20.00     Pack years: 2.00    Smokeless tobacco: Never Used    Tobacco comment: quit 30 years ago   Substance and Sexual Activity    Alcohol use: Yes     Alcohol/week: 0.0 oz     Comment: rare shares a beer or glass wine every 2-3 months     Drug use: No    Sexual activity: Yes     Partners: Male   Lifestyle    Physical activity:     Days per week: Not on file     Minutes per session: Not on file    Stress: Not on file   Relationships    Social connections:     Talks on phone: Not on file     Gets together: Not on file     Attends religious service: Not on file     Active member of club or  organization: Not on file     Attends meetings of clubs or organizations: Not on file     Relationship status: Not on file    Intimate partner violence:     Fear of current or ex partner: Not on file     Emotionally abused: Not on file     Physically abused: Not on file     Forced sexual activity: Not on file   Other Topics Concern    Not on file   Social History Narrative    Not on file       Medications:   The patient's medications were reviewed today and updated in the EMR.  Current Outpatient  Medications on File Prior to Visit   Medication Sig Dispense Refill    Amlodipine (NORVASC) 10 mg Tablet Take 1 tablet by mouth every day. 90 tablet 3    Atorvastatin (LIPITOR) 10 mg Tablet Take 1 tablet by mouth every day at bedtime. 90 tablet 1    Blood Sugar Diagnostic (ONETOUCH ULTRA TEST) Strips Use to test blood glucose 3x/day 300 strip 11    empagliflozin (JARDIANCE) 25 mg Tablet Take 1 tablet by mouth every day. 90 tablet 3    FLUTICASONE/SALMETEROL (ADVAIR DISKUS INHA) Take  by inhalation.      GlipiZIDE (GLUCOTROL XL) 10 mg SR Tablet Take 2 tablets by mouth every morning with a meal. (diabetes) 60 tablet 5    Insulin Glargine (LANTUS SOLOSTAR U-100 INSULIN) 100 unit/mL (3 mL) Pen Inject 22 units every night before bed. Please supply 90 day supply. 30 mL 2    Insulin Pen Needles, Disposable, (BD ULTRA-FINE III SHORT PEN NEEDLE) 31 gauge x 5/16" Use for injecting insulin 1 time per day 100 each 11    Losartan (COZAAR) 25 mg Tablet Take 1 tablet by mouth every day. 90 tablet 3    Metformin (GLUCOPHAGE) 1,000 mg tablet Take 1 tablet by mouth 2 times daily with meals. (diabetes) 180 tablet 3    Omeprazole (PRILOSEC) 20 mg Delayed Release Capsule TAKE 1 CAPSULE BY MOUTH TWICE DAILY BEFORE MEALS 180 capsule 1    Ondansetron (ZOFRAN) 8 mg Tablet Take 1 tablet by mouth every 8 hours if needed. 270 tablet 3    Venlafaxine ([name]) 75 mg Tablet TAKE 2 TABLETS BY MOUTH EVERY MORNING AND TAKE 1 TABLET BY MOUTH EVERY EVENING (Patient taking differently: Take 150 mg by mouth every day. TAKE 2 TABLETS BY MOUTH EVERY MORNING AND TAKE 1 TABLET BY MOUTH EVERY EVENING          ) 270 tablet 1     No current facility-administered medications on file prior to visit.        Labs:  CBC:   Lab Results   Lab Name Value Date/Time    WBC 10.9 06/29/2017 02:54 PM    WBC 8.9 08/30/2015 08:35 AM    HGB 12.7 06/29/2017 02:54 PM    HGB 14.4 08/30/2015 08:35 AM    HCT 41.0 06/29/2017 02:54 PM    HCT 44.6 08/30/2015 08:35 AM     PLT 299 06/29/2017 02:54 PM    PLT 217 08/30/2015 08:35 AM       BCP:   Lab Results   Lab Name Value Date/Time    NA 142 06/29/2017 02:54 PM    NA 142 08/30/2015 08:35 AM    K 3.8 06/29/2017 02:54 PM    K 4.0 08/30/2015 08:35 AM    CL 103 06/29/2017 02:54 PM  CL 104 08/30/2015 08:35 AM    CO2 25 06/29/2017 02:54 PM    CO2 22 (L) 08/30/2015 08:35 AM    BUN 17 06/29/2017 02:54 PM    BUN 19 08/30/2015 08:35 AM    CR 1.15 06/29/2017 02:54 PM    CR 0.88 08/30/2015 08:35 AM    GLU 145 (H) 06/29/2017 02:54 PM    GLU 139 (H) 08/30/2015 08:35 AM     LFT:   Lab Results   Lab Name Value Date/Time    AST 50 (H) 06/29/2017 02:54 PM    AST 71 (H) 08/30/2015 08:35 AM    ALT 46 06/29/2017 02:54 PM    ALT 69 (H) 08/30/2015 08:35 AM    ALP 137 (H) 06/29/2017 02:54 PM    ALP 104 08/30/2015 08:35 AM    ALB 3.7 06/29/2017 02:54 PM    ALB 4.2 08/30/2015 08:35 AM    TP 8.0 06/29/2017 02:54 PM    TP 8.5 (H) 08/30/2015 08:35 AM    TBIL 1.0 06/29/2017 02:54 PM    TBIL 0.9 08/30/2015 08:35 AM     Lipase:   Lipase   Date Value Ref Range Status   01/08/2016 91 13 - 51 U/L Final     Physical Exam:  Temp: 36.7 C (98 F) (03/27 0840)  Temp src: Oral (03/27 0840)  Pulse: 75 (03/27 0840)  BP: 128/77 (03/27 0840)  Resp: --  SpO2: --  Height: 157.5 cm (5' 2" ) (03/27 0840)  Weight: 78.8 kg (173 lb 11.6 oz) (03/27 0840)      General Appearance: alert, no distress, pleasant affect.  Mental Status: AAOX4, NAD  Eyes:  conjunctivae and corneas clear. PERRL, EOM's intact. sclerae normal.  Heart:  normal rate and regular rhythm, no murmurs, clicks, or gallops.  Lungs: clear to auscultation.  Abdomen: BS normal.  Abdomen soft, non-tender.  No masses or organomegaly.       Impression/Recommendations:  Paula Daugherty is a 72yrold female who presents for:    Gastroparesis: hx DM. GES 06/2016 w/ normal solid gastric retention of 49.82 % at 2 hours. Elevated solid gastric retention of 17.15 % at 4 hours. Currently mgmt of gastroparesis with diet and Reglan 10 mg  prn.   -Reglan 5 mg 30 min before meal (to assess for response at lower strength). Prescription to pharmacy.  -Diet modifications (eat small frequent meals; increase fluid intake to assist in digestion; light exercise after meals to promote digestion; avoid eating high fiber and high fat foods).    Hx adenomatous colon polyp (2015): No family hx crc. Colonoscopy 01/2014 -TA polyp (10 mm in size,flat, located in the cecum, removed piecemeal by snare cautery and retrieved for pathology); erosion; hemorrhoids; tics; repeat x 3 yrs. Per pathologist comment: Due to the fragmented nature of the specimen, completeness of excision cannot be confirmed.  -Colonoscopy w/ mod sedation   -2 day liquid diet + mag citrate + Colyte 4L    Chronic constipation/bloating/gas: straining w/ defecation. No opioid meds. Tried: MOM, Miralax, ExLax, Dulcolax, suppositories, Mag Citrate.  Not tried: Linzess, Amitiza, Trulance. Has not had anorectal manometry to assess for PFD.   -Trial of Linzess 145 mcg daily  -Rectal manometry    Hx duodenal NET (02/2016): Most recent EGD 04/14/17 for surveillance revealing no evidence of tumor on path from duodenal bxs.  -Repeat EGD in 04/2018 w/ Dr CVirgel Bouquet(not ordered today)    Patient barriers to learning: none    Patient/family understanding: verbalizes  RTC: f/u 2 wks via clinic visit or My Chart msg re: response to constipation med     Visit length: 34 minutes  > 50% time spent discussing gastroparesis diet and mgmt, reglan and potential SEs, colonoscopy for colon polyp surveillance, constipation mgmt, Linzess, EGD surveillance for NET due in 04/2018 (to be ordered at later date), as well as coordinating care on this patient. All questions/concerns were addressed to the satisfaction of the patient and she verbalizes understanding and agrees with the plan of care.    Ma Hillock, NP  PI# [id number]  Division of Gastroenterology  [facility]

## 2017-07-29 NOTE — Progress Notes (Signed)
[place]        PATIENT INFORMATION      Patient:  Paula Daugherty  Medical Record Number:  [id number]   Date of Birth:  04/05/1946   Date of Visit:  07/29/2017     Type of Visit:  Psychotherapy session   Time:  60 minutes    CPT Code:  81191      PSYCHOTHERAPY NOTE ([place])        SUBJECTIVE        Identifying Information    Paula Daugherty is a 72yrold female referred by JJamelle Haring MD for counseling related to depression related to family turmoil.      Patient Report    Patient reports that symptoms of depression and anxiety this week were still there, but to a lesser extent.  Still felt these things each day, but fewer hours.    Patient  states that progress towards goals has been made this week.  Her and her husband created a morning routine and have stuck with it.  Both seem to be enjoying the structure.    Patient  reports that homework assigned was followed through with, and looks forward to completing more next week.    Patient  reports physical symptoms related to medical conditions include pain in her intestine area; has seen her GI doctor this morning to formulate a plan going forward.    Patient  states that sleep/activity level/appetite was about the same as the previous week, but she was able to push herself through the tiredness to accomplish the things she needed to.    Patient  reports no SI/HI/loss of hope.      OBJECTIVE        Mental Status Exam    General Observations         Appearance  [x]  Well groomed  []  Unkempt  []  Discheveled      Build  []  Average  []  Thin  [x]  Overweight      Demeanor  [x]  Average  []   Hostile  []  Mistrustful  []  Withdrawn  []  Preoccupied  []  Demanding   Eye contact  [x]  Average  []  Avoidant  []  Intense      Motor activity  [x]  Average  []  Agitated  []  Slowed      Speech  [x]  Clear  []  Slurred  []  Rapid  []  Pressured     Thought Content         Delusions   [x]  None reported  []  Grandiose  []  Persecutory  []  Somatic  []  Bizarre  []  Nihilistic  []  Religious   Other   [x]  None reported  []  Preoccupied  []  Obsessional  []  Phobic  []  Guilty  []  Guarded  []  Ideas of reference   Self-abuse   [x]  None reported  []  Passive suicidal ideation   []  Active suicidal ideation  []  Intent  []  Plan  []  Means  []  Self-mutilation   Harm to others   [x]  None reported  []  Aggression towards others (thoughts only)  []  Intent to harm others  []  Plan to hurt others      Perception         Hallucinations   [x]  None reported  []  Auditory  []  Visual  []  Olfactory  []  Gustatory  []  Tactile    Other   [x]  None reported  []  Illusions  []  Depersonalization  []  Derealization  Thought Process           [x]  Logical  []  Circumstantial  []  Tangential  []  Loose  []  Racing  []  Incoherent     []  Concrete  []  Blocked  []  Flight of ideas      Mood           [x]  Euthymic  []  Depressed  []  Anxious  []  Angry  []  Euphoric  []  Irritable   Affect           [x]  Full  []  Constricted  []  Flat  []  Inappropriate  []  Labile    Behavior           [x]  Cooperative  []  Resistant  []  Agitated  []  Impulsive  []  Over sedated  []  Assaultive     []  Aggressive  []  Hyperactive  []  Restless  []  Loss of interest  []  Anhedonia  []  [place]     []  Withdrawn  []  Dystonia  []  Tardive Dyskinesia      Cognition         Impairment of:   [x]  None reported  []  Orientation  []  Memory  []  Attention/ concentration  []  Ability to abstract     Insight/Judgment           []  Good  [x]  Fair  []  Poor            Psychotherapy content    Today's session included patient's husband, where their morning routine from last week was discussed.  Patient and her husband report no  major issues completing their routine, and enjoyed doing it.      Patient reports several different family-related things that have happened this week that have left her feeling angry and upset.  A phone call with her mother appeared to spark much anger, along with two un-responsive texts from her daughter.  Processed these feelings and how to cope with them in a healthy way.         Medications    Outpatient Medications Prior to Visit   Medication Sig Dispense Refill    Amlodipine (NORVASC) 10 mg Tablet Take 1 tablet by mouth every day. 90 tablet 3    Atorvastatin (LIPITOR) 10 mg Tablet Take 1 tablet by mouth every day at bedtime. 90 tablet 1    Blood Sugar Diagnostic (ONETOUCH ULTRA TEST) Strips Use to test blood glucose 3x/day 300 strip 11    empagliflozin (JARDIANCE) 25 mg Tablet Take 1 tablet by mouth every day. 90 tablet 3    FLUTICASONE/SALMETEROL (ADVAIR DISKUS INHA) Take  by inhalation.      GlipiZIDE (GLUCOTROL XL) 10 mg SR Tablet Take 2 tablets by mouth every morning with a meal. (diabetes) 60 tablet 5    Insulin Glargine (LANTUS SOLOSTAR U-100 INSULIN) 100 unit/mL (3 mL) Pen Inject 22 units every night before bed. Please supply 90 day supply. 30 mL 2    Insulin Pen Needles, Disposable, (BD ULTRA-FINE III SHORT PEN NEEDLE) 31 gauge x 5/16" Use for injecting insulin 1 time per day 100 each 11    linaclotide (LINZESS) 145 mcg Capsule Take 1 capsule by mouth every day. 30 capsule 0    Losartan (COZAAR) 25 mg Tablet Take 1 tablet by mouth every day. 90 tablet 3    Magnesium Citrate Liquid Drink contents of bottle followed by at least 2 cups of water. Do this around 12 pm 2 days prior to colonoscopy. 1 bottle 0    Metformin (GLUCOPHAGE) 1,000  mg tablet Take 1 tablet by mouth 2 times daily with meals. (diabetes) 180 tablet 3    Metoclopramide (REGLAN) 5 mg Tablet Take 1 tablet by mouth 3 times daily. 30 minutes before each meal 90 tablet 0    Omeprazole (PRILOSEC) 20 mg Delayed Release Capsule  TAKE 1 CAPSULE BY MOUTH TWICE DAILY BEFORE MEALS 180 capsule 1    Ondansetron (ZOFRAN) 8 mg Tablet Take 1 tablet by mouth every 8 hours if needed. 270 tablet 3    Venlafaxine ([name]) 75 mg Tablet TAKE 2 TABLETS BY MOUTH EVERY MORNING AND TAKE 1 TABLET BY MOUTH EVERY EVENING (Patient taking differently: Take 150 mg by mouth every day. TAKE 2 TABLETS BY MOUTH EVERY MORNING AND TAKE 1 TABLET BY MOUTH EVERY EVENING          ) 270 tablet 1     No facility-administered medications prior to visit.        Changes in medications:  Was prescribed several new medications for her digestive issues.        Therapeutic modalities used     Clinician provided active listening and motivational interviewing techniques during this session.  Continued to discuss the importance of continuing behavioral activation and staying consistent with what works for her.      ASSESSMENT        Session summary    Clinician feels patient is progressing towards goals as evidenced by follow through with homework assigned.      Clinician feels patient's safety is not at risk at this time due to a safe home environment, a supportive husband, friends in the community.  A higher level of care is not indicated at this time.    Clinician feels patient may benefit from involving some pleasurable activities throughout the week, aside from the purposeful tasks that she and her husband are completing.    No new or revised diagnoses are needed at this time.      Assessment questionnaires    07/29/2017 PHQ-9 score:   14  07/29/2017 GAD-7 score:   10        PLAN       Will review patient case, medication questions during CoCM IDT.    Therapeutic focus will include:  Consistent behavioral activation; Mindfulness to better manage emotions.   Clinician will follow up next session regarding homework assigned, energy  levels.    Homework given:    -Continue to maintain a morning routine, and continue to work through the boxes you are sorting through.  -Consider writing some poetry during the week to see if it still feels like a good outlet for your feelings.     07/29/2017 Session II of VIII.  Will continue with weekly individual psychotherapy.          EDUCATION      Provided patient psychoeducation regarding the importance of behavioral activation and continuing it consistently, particularly in the beginning phases of therapy.        Kathrene Bongo, [place] [facility]  [facility]

## 2017-07-29 NOTE — Nursing Note (Signed)
Patient is in room #8    Medication list given at the time of check in for review by patient, instructed patient to indicate to the doctor any corrections that need to be made for Medication Reconciliation.    Patient identifiers obtained. vital signs taken, screened for pain, allergies checked, patient roomed, and Ma Hillock, NP notified.     Lance Morin, [place]

## 2017-07-29 NOTE — Patient Instructions (Addendum)
Discharge Instructions      Schedule colonoscopy at [facility]. Ph: [phone]    If you have a bowel prep at home (that is not Suprep), call [facility] for a Suprep prescription.    Trial of Linzess 145 mcg daily. Prescription sent to pharmacy.    F/u via visit or my chart msg in 2 wks.    Take reglan 5 mg tab 30 min before meal. Prescription sent to pharmacy.  (Don't take the Reglan 10 mg tabs for now)    Diet modifications (eat small frequent meals; increase fluid intake to assist in digestion; light exercise after meals to promote digestion; avoid eating high fiber and high fat foods).    The word gastroparesis literally means a weak stomach. In this condition, the stomach's ability to digest solid food decreases. This happens because the stomach muscles responsible for pushing the food through the digestive tract do not work properly. This often results into delayed stomach emptying. In gastroparesis, the stomach requires extra time to empty its contents into the intestine.    Specific dietary changes play an important role in controlling gastroparesis. Changes in diet is the right approach to improve the patient's health. Doctors often recommend to have small frequent meals (6 to 8 meals daily) instead of having large meals twice a day. The stomach will require more effort to digest a super sized meal than a lighter snack. Pureed or a liquid diet will be better tolerated, as liquids can easily and quickly pass through the stomach. When it comes to eating food some guidelines should be followed by gastroparesis patients. It is discussed below:    It is necessary to avoid high fiber and high fat foods. Fiber is mainly found in raw vegetables and fruits, legumes and whole grains. As the stomach will take extra time to digest fiber foods, the intake of these foods must be minimized. For this reason people diagnosed with gastroparesis, are often recommended to have low fiber foods, well cooked vegetables, fish and  refined breads. This is because fat slows down the process of digestion and fiber cannot be easily digested. The indigestible part of food forms bezoars in the stomach. Foods that can cause bezoars include berries, dried figs, coconut, apples, tomato peels, potato, brussels sprouts and corn.      COLONOSCOPY - TWO DAY BOWEL PREP    [facility], [place]                 [address]            [place], [place]  [address]      [phone]             Hours:  7:15am-4:30pm               It is important that you carefully follow these instructions.   Should you have any questions regarding the preparation instructions, please call the GI nurse at the number listed.  After hours please call the Hospital Operator at [phone] and ask to speak to the GI doctor on call.      Colonoscopy Two Day Preparation Instructions:    FIVE DAYS BEFORE YOUR PROCEDURE:      STOP medications that increase bleeding - Motrin (ibuprofen), Aleve, Naprosyn (naproxen sodium), Advil (ibuprofen), other NSAIDs (Celebrex, Mobic). Also, stop taking iron supplements and fish oil.     You may take Tylenol (acetaminophen) for if needed for headaches and pain.  DO NOT EAT seeds, nuts, corn, salad, and popcorn for 5 days prior to procedure.     TWO DAYS BEFORE YOUR PROCEDURE:      Drink one bottle of CLEAR (not red in color) magnesium citrate (10 oz) followed by 2 glasses of water.     Follow a CLEAR LIQUID DIET ALL DAY - from the time you wake and throughout the 48 hour period prior to the procedure.     Allowed: Clear broth, hard candy, tea, black coffee, plain jell-o, soda pop, Gatorade/sports drinks and clear fruit juices (such as apple or white grape).     Nothing RED in color.     Not solid food, milk, milkshakes, protein shakes, [place] juice, or other non-clear liquids.     If you are diabetic and take insulin or oral diabetic medications, contact your primary care provider for instructions on how to adjust the dosing of  these medications in preparation for the procedure.    ONE DAY BEFORE YOUR PROCECURE      Follow a CLEAR LIQUID DIET ALL DAY - from the time you wake and throughout the 48 hour period prior to the procedure.     Allowed: Clear broth, hard candy, tea, black coffee, plain jell-o, soda pop, Gatorade/sports drinks and clear fruit juices (such as apple or white grape).     Nothing RED in color.     Not solid food, milk, milkshakes, protein shakes, [place] juice, or other non-clear liquids.     If you are diabetic and take insulin or oral diabetic medications, contact your primary care provider for instructions on how to adjust the dosing of these medications in preparation for the procedure.    Begin at 6:00 PM the evening before your procedure    * Pour ONE (6 ounce) bottle of SUPREP liquid into the mixing container.  * Add cool drinking water to the 16 ounce line on the container and stir well.  * Drink ALL the liquid in the container.  * You must drink at least (2) more 16 ounce containers of water over the next 1 hour.    MORNING OF YOUR PROCEDURE - 6 HOURS BEFORE YOUR PROCEDURE    * Pour ONE (6 ounce) bottle of SUPREP liquid into the mixing container.  * Add cool drinking water to the 16 ounce line on the container and stir well.  * Drink ALL the liquid in the container.  * You must drink at least (2) more 16 ounce containers of water over the next 1 hour.           *    No food is allowed until after your procedure is complete.     If you usually take medication in the morning for blood pressure, chronic pain, or heart irregularities, except NSAIDs (ibuprofen, aspirin, naprosyn, anacin, aleve, advil, motrin), you may take these with just a sip of water.       Remove jewelry and leave any other valuables at home.      You will be at the facility approximately 1-3 hours, from time of check-in until the time of discharge.  (Check in and preparation for the procedure may take up to one hour prior to the actual  procedure start time).      You should not drink alcoholic beverage, sign legal documents, drive or operate machinery for 24 hours following the sedated procedure.      You must have someone 18 yrs or older to accompany  you home after the procedure. That individual can either wait in the waiting room or be available to return within 15 minutes of a phone call.        Risks associated with Reglan    REGLAN can cause serious side effects, including:   Abnormal muscle movements called tardive dyskinesia (TD). These movements happen mostly in the face muscles. You cannot control these movements. They may not go away even after stopping REGLAN. There is no treatment for TD, but symptoms may lessen or go away over time after you stop taking REGLAN.   Your chances for getting TD go up:    the longer you take REGLAN and the more REGLAN you take. .    if you are older, especially if you are a woman    if you have diabetes mellitus    It is not possible for your doctor to know if you will get TD if you take REGLAN.   Call your doctor right away if you get movements you cannot stop or control, such as:    lip smacking, chewing, or puckering up your mouth    frowning or scowling    sticking out your tongue    blinking and moving your eyes    shaking of your arms and legs     The patient has been educated about the black box warning for tardive dyskinesia in regard to taking Reglan.        Rectal Manometry    Instructions for Rectal Manometry   Rectal manometry is a test used to measure and assess pressures, reflexes, and sensation in the rectum. It also evaluates the efficiency of the anal sphincter. The procedure will help your doctor evaluate the cause and determine the correct treatment of fecal incontinence or constipation. While lying on your left side a thin, flexible catheter with a small uninflated balloon at the tip is passed through the anus and into the rectum. The catheter is slowly withdrawn while  numerous pressure measurements are recorded. You will also be asked to push and squeeze your anal muscles at certain times. You will also be asked to indicate when you experience a feeling of fullness or distension in the rectum, upon inflation of a small balloon at the end of the catheter.    PLEASE FOLLOW THESE INSTRUCTIONS COMPLETELY AND CAREFULLY     Do NOT take smooth muscle relaxants (valium, flexeril, or pain medications containing narcotics) for 24 hours prior to your appointment time.   Obtain a Fleet enema from your local pharmacy and use ONE hour prior to your appointment time.    This is a very easily tolerated procedure. The procedure takes approximately 20 to 30 minutes to perform. You will be able to return to work after.

## 2017-07-29 NOTE — Patient Instructions (Signed)
Homework:  -Continue to maintain a morning routine, and continue to work through the boxes you are sorting through.  -Consider writing some poetry during the week to see if it still feels like a good outlet for your feelings.

## 2017-08-03 ENCOUNTER — Encounter: Payer: Self-pay | Admitting: Family Medicine

## 2017-08-05 ENCOUNTER — Ambulatory Visit: Payer: Medicare Other

## 2017-08-05 DIAGNOSIS — F329 Major depressive disorder, single episode, unspecified: Secondary | ICD-10-CM

## 2017-08-05 DIAGNOSIS — F3289 Other specified depressive episodes: Secondary | ICD-10-CM

## 2017-08-05 DIAGNOSIS — F419 Anxiety disorder, unspecified: Secondary | ICD-10-CM

## 2017-08-05 NOTE — Progress Notes (Signed)
[place]        PATIENT INFORMATION      Patient:  Paula Daugherty  Medical Record Number:  [id number]   Date of Birth:  26-Sep-1945   Date of Visit:  08/05/2017     Type of Visit:  Psychotherapy session   Time:  60 minutes    CPT Code:  44010      PSYCHOTHERAPY NOTE ([place])        SUBJECTIVE        Identifying Information    Paula Daugherty is a 72yrold female referred by JJamelle Haring MD for counseling related to depression and anxiety related to family turbulence.      Patient Report    Patient reports that symptoms of depression and anxiety this week were less, with the exception of the last two days where family medical issues have come up.    Patient  states that progress towards goals is good.  She has been consistent in keeping a morning routine, which has helped.  States that she is feeling a bit better.    Patient  reports that homework assigned was completed.  Reports barriers as wanting her husband to be more involved in activities around the house.    Patient  reports no SI/HI/loss of hope.      OBJECTIVE        Mental Status Exam    General Observations         Appearance  [x]  Well groomed  []  Unkempt  []  Discheveled      Build  []  Average  []  Thin  [x]  Overweight      Demeanor  [x]  Average  []  Hostile  []  Mistrustful  []  Withdrawn  []  Preoccupied  []  Demanding   Eye contact  [x]  Average  []  Avoidant  []  Intense      Motor activity  [x]  Average  []  Agitated  []  Slowed      Speech  [x]  Clear  []  Slurred  []  Rapid  []  Pressured     Thought Content         Delusions   [x]  None reported  []  Grandiose  []  Persecutory  []   Somatic  []  Bizarre  []  Nihilistic  []  Religious   Other   [x]  None reported  []  Preoccupied  []  Obsessional  []  Phobic  []  Guilty  []  Guarded  []  Ideas of reference   Self-abuse   [x]  None reported  []  Passive suicidal ideation   []  Active suicidal ideation  []  Intent  []  Plan  []  Means  []  Self-mutilation   Harm to others   [x]  None reported  []  Aggression towards others (thoughts only)  []  Intent to harm others  []  Plan to hurt others      Perception         Hallucinations   [x]  None reported  []  Auditory  []  Visual  []  Olfactory  []  Gustatory  []  Tactile    Other   [x]  None reported  []  Illusions  []  Depersonalization  []  Derealization      Thought Process           [x]  Logical  []  Circumstantial  []  Tangential  []  Loose  []  Racing  []  Incoherent     []  Concrete  []  Blocked  []  Flight of ideas      Mood           [  x] Euthymic  []  Depressed  []  Anxious  []  Angry  []  Euphoric  []  Irritable   Affect           [x]  Full  []  Constricted  []  Flat  []  Inappropriate  []  Labile    Behavior           [x]  Cooperative  []  Resistant  []  Agitated  []  Impulsive  []  Over sedated  []  Assaultive     []  Aggressive  []  Hyperactive  []  Restless  []  Loss of interest  []  Anhedonia  []  [place]ia     []  Withdrawn  []  Dystonia  []  Tardive Dyskinesia      Cognition         Impairment of:   [x]  None reported  []  Orientation  []  Memory  []  Attention/ concentration  []  Ability to abstract     Insight/Judgment           []  Good  [x]  Fair  []  Poor            Psychotherapy content    Today's session included discussion of feeling better overall, but had a difficult couple of days managing communication with her mom and daughter about their health issues.  Discussed barriers to keeping a daily routine and how to mitigate these factors.       Medications    Outpatient Medications Prior to Visit   Medication Sig Dispense Refill    Amlodipine (NORVASC) 10 mg Tablet Take 1 tablet by mouth every day. 90 tablet 3    Atorvastatin (LIPITOR) 10 mg  Tablet Take 1 tablet by mouth every day at bedtime. 90 tablet 1    Blood Sugar Diagnostic (ONETOUCH ULTRA TEST) Strips Use to test blood glucose 3x/day 300 strip 11    empagliflozin (JARDIANCE) 25 mg Tablet Take 1 tablet by mouth every day. 90 tablet 3    FLUTICASONE/SALMETEROL (ADVAIR DISKUS INHA) Take  by inhalation.      GlipiZIDE (GLUCOTROL XL) 10 mg SR Tablet Take 2 tablets by mouth every morning with a meal. (diabetes) 60 tablet 5    Insulin Glargine (LANTUS SOLOSTAR U-100 INSULIN) 100 unit/mL (3 mL) Pen Inject 22 units every night before bed. Please supply 90 day supply. 30 mL 2    Insulin Pen Needles, Disposable, (BD ULTRA-FINE III SHORT PEN NEEDLE) 31 gauge x 5/16" Use for injecting insulin 1 time per day 100 each 11    linaclotide (LINZESS) 145 mcg Capsule Take 1 capsule by mouth every day. 30 capsule 0    Losartan (COZAAR) 25 mg Tablet Take 1 tablet by mouth every day. 90 tablet 3    Magnesium Citrate Liquid Drink contents of bottle followed by at least 2 cups of water. Do this around 12 pm 2 days prior to colonoscopy. 1 bottle 0    Metformin (GLUCOPHAGE) 1,000 mg tablet Take 1 tablet by mouth 2 times daily with meals. (diabetes) 180 tablet 3    Metoclopramide (REGLAN) 5 mg Tablet Take 1 tablet by mouth 3 times daily. 30 minutes before each meal 90 tablet 0    Omeprazole (PRILOSEC) 20 mg Delayed Release Capsule TAKE 1 CAPSULE BY MOUTH TWICE DAILY BEFORE MEALS 180 capsule 1    Ondansetron (ZOFRAN) 8 mg Tablet Take 1 tablet by mouth every 8 hours if needed. 270 tablet 3    Venlafaxine ([name]) 75 mg Tablet TAKE 2 TABLETS BY MOUTH EVERY MORNING AND TAKE 1 TABLET BY MOUTH EVERY EVENING (Patient taking differently:  Take 150 mg by mouth every day. TAKE 2 TABLETS BY MOUTH EVERY MORNING AND TAKE 1 TABLET BY MOUTH EVERY EVENING          ) 270 tablet 1     No facility-administered medications prior to visit.        Changes in medications:  Started taking the medications prescribed by her GI doctor;  some mild symptoms reported.        Therapeutic modalities used     Clinician provided active listening and motivational interviewing techniques during this session.  Provided psychoeducation regarding communication and coping strategies that are developed in childhood.      ASSESSMENT        Session summary    Clinician feels patient is progressing towards goals as evidenced by continuing to engage in morning routine.  Also patient reports feeling better each day, as she stated she notices a difference in her response when others ask her how she's doing.    Patient has identified the following needs during this session:  Some more support from husband at home; will discuss at next meeting and will include husband for part of the time.    Clinician feels patient's safety is not at risk at this time due to a safe, stable home environment, good relationship with husband.  A higher level of care is not indicated at this time.    Clinician feels patient may benefit from learning other communication strategies to use with husband.    No new or revised diagnoses are needed at this time.    Medication questions:  None      Assessment questionnaires    08/05/2017 PHQ-9 score:   15  08/05/2017 GAD-7 score:   8        PLAN       Will review patient case, medication questions during CoCM IDT.   Therapeutic focus will include:  Communication; mindfulness   Clinician will follow up next session regarding homework and how she is managing family stressors.    Homework given:  Continue with morning routine.  Add a few more "fun" elements instead of focusing only on productivity.  Aim for one to two walks outside, and try to be present at that time by focusing on the trees, plants, wildlife, etc.      EDUCATION      Provided  patient psychoeducation regarding the importance of continued behavioral activation, communication and coping patterns developed in childhood.  Also discussed normalization of how feeling better typically happens gradually as it is for her, rather than at one big "ah ha" moment.      Kathrene Bongo, [facility]  [facility]

## 2017-08-05 NOTE — Patient Instructions (Signed)
Homework:  Continue with morning routine.  Add a few more "fun" elements instead of focusing only on productivity.  Aim for one to two walks outside, and try to be present at that time by focusing on the trees, plants, wildlife, etc.

## 2017-08-10 ENCOUNTER — Encounter: Payer: Self-pay | Admitting: Adult Health

## 2017-08-10 NOTE — Telephone Encounter (Signed)
Will forward to NP for review and response.    Medical Assistant, please contact patient with instructions.  Thank you. Clide Dales RN

## 2017-08-10 NOTE — Telephone Encounter (Signed)
Patient contacted via phone. She is taking Linzess 145 mcg daily. Initially, it was giving her diarrhea, now it is hit or miss. She will continue medication and contact me if persistent diarrhea. If it continues, she will consider trial of Linzess 72 mcg capsule daily.  In regard to upcoming colonoscopy, she has Moviprep at home and the bowel prep instructions. She doesn't need anything from the Med Asst..   Assunta Gambles, NP  Division of Gastroenterology  PI# [id number]

## 2017-08-10 NOTE — Telephone Encounter (Signed)
From: Paula Daugherty  To: Ma Hillock, NP  Sent: 08/07/2017 5:19 PM PDT  Subject: Visit Follow-up Question    [name], I can't remember what terms you wanted to be sure were included in my colonoscopy prep. I got it scheduled but first opening was mid August. I want to make sure I have the right stuff. I am taking the meds you ordered. Still have stomach pain. The pill for bowel movements gave me diarrhea the first three days, now not getting much movement. Going 2 to 3 days. Getting the other test scheduled. Hopefully all this will make things better.     Thanks, Paula Daugherty

## 2017-08-12 ENCOUNTER — Encounter: Payer: Self-pay | Admitting: Family Medicine

## 2017-08-14 ENCOUNTER — Ambulatory Visit: Payer: Medicare Other | Admitting: "Endocrinology

## 2017-08-14 ENCOUNTER — Encounter: Payer: Self-pay | Admitting: "Endocrinology

## 2017-08-14 DIAGNOSIS — E119 Type 2 diabetes mellitus without complications: Secondary | ICD-10-CM

## 2017-08-14 DIAGNOSIS — E279 Disorder of adrenal gland, unspecified: Secondary | ICD-10-CM

## 2017-08-14 DIAGNOSIS — I10 Essential (primary) hypertension: Secondary | ICD-10-CM

## 2017-08-14 DIAGNOSIS — E1165 Type 2 diabetes mellitus with hyperglycemia: Secondary | ICD-10-CM

## 2017-08-14 DIAGNOSIS — Z794 Long term (current) use of insulin: Secondary | ICD-10-CM

## 2017-08-14 MED ORDER — AMLODIPINE 10 MG TABLET
10.0000 mg | ORAL_TABLET | Freq: Every day | ORAL | 3 refills | Status: DC
Start: 2017-08-14 — End: 2018-05-12

## 2017-08-14 MED ORDER — LOSARTAN 25 MG TABLET
25.0000 mg | ORAL_TABLET | Freq: Every day | ORAL | 3 refills | Status: DC
Start: 2017-08-14 — End: 2018-05-12

## 2017-08-14 MED ORDER — METFORMIN 1,000 MG TABLET: 1000.0000 mg | ORAL_TABLET | Freq: Two times a day (BID) | ORAL | 3 refills | Status: DC

## 2017-08-14 NOTE — Nursing Note (Signed)
Patient verified by name and date of birth.   Vital signs assessed, allergies verified, screened for pain and verified pharmacy.   Kalima Saylor Banducci Bristow, MA

## 2017-08-14 NOTE — Patient Instructions (Signed)
Please have fasting lab tests done prior to follow up in June

## 2017-08-14 NOTE — Progress Notes (Signed)
Endocrinology Follow up clinic note:    Chief Complaint:  "I'm here to follow up on my diabetes."    HPI: Paula Daugherty is a 72yrold female who has a diagnosis of type 2 diabetes mellitus who presents to Endocrinology for follow up. Her history is as follows:  She was initially diagnosed with diabetes around 2000 on routine labs. She has a strong family history of diabetes so she wasn't surprised at this. She was started on Metformin around 2005 and then she was started on insulin in 2014. She was up to 64 units of Lantus at night. However, after making changes to her diet and lifestyle, she was taken off insulin in 2015. Basal insulin was added back to her regimen in 2018.      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     Interim History: She returns today for follow up. Her last visit with Endocrinology was on 05/01/2017. Since this visit, she states that she continues to be under a lot of stress. She has been dealing with chronic abdominal pain and nausea for which she is following with GI. She has also been dealing with a lot of personal stressors with tension with her mother and her daughter. She has started to see a counselor and has also been more active lately which has been helpful.   Regarding her diabetes, her blood glucose seems to continue to fluctuate somewhat, although she has significantly less hypoglycemia recently. She has been having more juice popsicles lately as these sound good to her and do ok with her stomach issues, however, she knows that these may not be the best for her diabetes.     Current Regimen:               Metformin 1000 mg BID             Glipizide XR 20 mg daily             Jardiance 25 mg daily             Lantus 22 units QHS  Insulin injection technique: Holds the needle in for a few seconds with each injection   Injection sites: Abdomen  Rotates sites: Yes  Discards insulin 30 days after opening: Yes  Hypoglycemia: No, none  recently  Hyperglycemia: Yes, will sometimes increase to >150    Meter brought today: Yes  Home blood glucose log brought to clinic today: No  Checks blood glucose: 2x/day  Home blood glucose numbers per pt's log:   Blood Glucose:     Pre-breakfast: 126 to 211 (some are postprandial)    Pre-lunch: 130 to 257.      Pre-dinner: 71 to 168.    Last eye exam: 06/29/2017, no retinopathy, f/u 1 year  Foot exam: Will do today     ROS:  All other systems were reviewed and are negatative except for pertinent positive and negative responses as documented in HPI.     Medications:  Medication reconciliation was performed today.   Current Outpatient Medications on File Prior to Visit   Medication Sig Dispense Refill    Atorvastatin (LIPITOR) 10 mg Tablet Take 1 tablet by mouth every day at bedtime. 90 tablet 1    Blood Sugar Diagnostic (ONETOUCH ULTRA TEST) Strips Use to test blood glucose 3x/day 300 strip 11    empagliflozin (JARDIANCE) 25 mg Tablet Take 1 tablet by mouth every day. 90 tablet 3  FLUTICASONE/SALMETEROL (ADVAIR DISKUS INHA) Take  by inhalation.      GlipiZIDE (GLUCOTROL XL) 10 mg SR Tablet Take 2 tablets by mouth every morning with a meal. (diabetes) 60 tablet 5    Insulin Glargine (LANTUS SOLOSTAR U-100 INSULIN) 100 unit/mL (3 mL) Pen Inject 22 units every night before bed. Please supply 90 day supply. 30 mL 2    Insulin Pen Needles, Disposable, (BD ULTRA-FINE III SHORT PEN NEEDLE) 31 gauge x 5/16" Use for injecting insulin 1 time per day 100 each 11    linaclotide (LINZESS) 145 mcg Capsule Take 1 capsule by mouth every day. 30 capsule 0    Magnesium Citrate Liquid Drink contents of bottle followed by at least 2 cups of water. Do this around 12 pm 2 days prior to colonoscopy. 1 bottle 0    Metoclopramide (REGLAN) 5 mg Tablet Take 1 tablet by mouth 3 times daily. 30 minutes before each meal 90 tablet 0    Omeprazole (PRILOSEC) 20 mg Delayed Release Capsule TAKE 1 CAPSULE BY MOUTH TWICE DAILY BEFORE  MEALS 180 capsule 1    Ondansetron (ZOFRAN) 8 mg Tablet Take 1 tablet by mouth every 8 hours if needed. 270 tablet 3    Venlafaxine ([name]) 75 mg Tablet TAKE 2 TABLETS BY MOUTH EVERY MORNING AND TAKE 1 TABLET BY MOUTH EVERY EVENING (Patient taking differently: Take 150 mg by mouth every day. TAKE 2 TABLETS BY MOUTH EVERY MORNING AND TAKE 1 TABLET BY MOUTH EVERY EVENING          ) 270 tablet 1     No current facility-administered medications on file prior to visit.      I did review patient's past medical and family/social history, no changes noted.   PMH:  Past medical history was reviewed from problem list.   Patient Active Problem List   Diagnosis    DM2 (diabetes mellitus, type 2) ([place])    Depression with anxiety    Stress at home    Leg cramps-right calf    Right ear pain    Fibromyalgia    HTN (hypertension)    GERD (gastroesophageal reflux disease)    Hyperlipidemia with target LDL less than 70    Vitamin D insufficiency    Abnormal LFTs    Renal cyst ( 6.4 cm cyst left kidney)    Cough    Type 2 diabetes mellitus without complication (HCC)    Fatty liver    Macroalbuminuric diabetic nephropathy (HCC)    History of actinic keratoses    Cataract    Ptosis of eyelid    Liver fibrosis    Adrenal nodule ([place])    Medication Therapy Auth    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HCC)    Mixed conductive and sensorineural hearing loss of both ears    Neoplasm of uncertain behavior of skin    Abdominal pain, generalized    Nausea without vomiting    Asymptomatic microscopic hematuria    Subcutaneous nodule     VITAL SIGNS:  BP 137/84 (SITE: right arm, Orthostatic Position: sitting, Cuff Size: regular)   Pulse 76   Temp 36.7 C (98.1 F) (Temporal)   Resp 16   Wt 77.8 kg (171 lb 8.3 oz)   LMP 05/06/1983   BMI 31.37 kg/m   Body mass index is 31.37 kg/m.    PHYSICAL EXAM:  General Appearance: healthy, alert, no distress, pleasant affect, cooperative.  Eyes:  conjunctivae  and corneas clear. PERRL, EOM's intact.  sclerae normal.  Mouth: normal.  Neck:  Neck supple. No adenopathy, thyroid symmetric, normal size.  Heart:  normal rate and regular rhythm, no murmurs, clicks, or gallops.  Lungs: clear to auscultation.  Abdomen: BS normal.  Abdomen soft, non-tender.  No masses or organomegaly.  Extremities:  no cyanosis, clubbing, or edema.  Foot Exam: normal DP and PT pulses and no trophic changes or ulcerative lesions.  Skin:  Skin color, texture, turgor normal. No rashes or lesions.  Neuro: Gait normal. Reflexes normal and symmetric. Sensation and strength grossly normal.  Mental Status: Appearance/Cooperation: in no apparent distress and well developed and well nourished  Eye Contact: normal  Speech: normal volume, rate, and pitch    LAB TESTS/STUDIES:   I personally reviewed the following laboratory and imaging studies.     03/06/2017 10:58 03/06/2017 10:58 04/09/2017 14:16 06/29/2017 14:54   SODIUM 140  141 142   POTASSIUM 3.7  3.2 (L) 3.8   CHLORIDE 99  101 103   CARBON DIOXIDE TOTAL 26  25 25    UREA NITROGEN, BLOOD (BUN) 15  23 (H) 17   CREATININE BLOOD 0.94  1.12 1.15   GLUCOSE 131 (H)  100 (H) 145 (H)   CALCIUM 9.5  9.9 9.5   PROTEIN 8.3   8.0   ALBUMIN 4.0   3.7   ALKALINE PHOSPHATASE (ALP) 130 (H)   137 (H)   ASPARTATE TRANSAMINASE (AST) 47 (H)   50 (H)   BILIRUBIN TOTAL 0.9   1.0   ALANINE TRANSFERASE (ALT) 44   46   E-GFR, AFRICAN AMERICAN >60  58 56   E-GFR, NON-AFRICAN AMERICAN >60  50 49   IRON TOTAL 73 73     TRANSFERRIN 271      TOTAL IRON BINDING CAPACITY 377      IRON PERCENT SATURATION 19.4      FERRITIN 11      FOLATE 9.0      HGB A1C 6.9 (H)   6.6 (H)   HGB A1C,GLUCOSE EST AVG 151   143   VITAMIN B12 313        Lab Results   Lab Name Value Date/Time    CHOL 179 11/11/2016 11:09 AM    CHOL 158 08/30/2015 08:35 AM    LDLC 84 11/11/2016 11:09 AM    LDLC 74 08/30/2015 08:35 AM    HDL 54 11/11/2016 11:09 AM    HDL 58 08/30/2015 08:35 AM    TRIG 206 (H) 11/11/2016 11:09 AM     TRIG 132 08/30/2015 08:35 AM        06/29/2017 14:54   WHITE BLOOD CELL COUNT 10.9   HEMOGLOBIN 12.7   HEMATOCRIT 41.0   MCV 78.7 (L)   RDW 17.4 (H)   PLATELET COUNT 299      03/06/2017 11:07   CREATININE SPOT URINE 113.00   MICROALBUMIN URINE 14.0   MICROALBUMIN/CREATININE RATIO 124 (H)     11/18/2016:      Ref Range & Units 75moago        TOTAL VOLUME mL 1650     TIME OF COLLECTION hr 24     DOPAMINE,UR PER VOLUME ug/L 41     DOPAMINE,UR PER 24HR 77 - 324 ug/d 68 (L)     DOPAMINE,UR RATIO TO CRT 0 - 250 ug/g CRT 73     NOREPINEPHRINE,UR PER VOLUME ug/L 21     NOREPINEPHRINE,UR PER 24HR 16 - 71 ug/d 35  NOREPINEPHRINE,UR RATIO TO CRT 0 - 45 ug/g CRT 38     EPINEPHRINE,UR PER VOLUME ug/L <1     EPINEPHRINE,UR PER 24 HR 1 - 7 ug/d <2     EPINEPHRINE,UR RATIO TO CRT 0 - 20 ug/g CRT <2            Ref Range & Units 87moago        TOTAL VOLUME mL 1650     TIME OF COLLECTION hr 24     CORTISOL,UR FREE PER VOLUME ug/L 16.50     CORTISOL,UR FREE PER 24HR <=45.0 ug/d 27.2     CORTISOL,UR FREE RATIO TO CRT ug/g CRT 29.46            Ref Range & Units 114mogo        TOTAL VOLUME mL 1650     TIME OF COLLECTION hr 24     METANEPHRINE,UR-PER VOLUME ug/L 61     METANEPHRINE,UR-PER 24HR 39 - 143 ug/d 101     METANEPHRINE,UR-RATIO TO CRT 0 - 300 ug/g CRT 109     NORMETANEPHRINE,UR-PER VOLUME ug/L 304     NORMETANEPHRINE,UR-PER 24HR 109 - 393 ug/d 502 (H)     NORMETANEPHRIN,UR-RATIO TO CRT 0 - 400 ug/g CRT 543 (H)     METANEPHRINES INTERPRETATION   See Note     CREATININE,UR PER VOLUME mg/dL 56     CREATININE,UR PER 24HR 500 - 1400 mg/d 924        CT Abdomen (04/08/2017):  FINDINGS:  URINARY COLLECTING SYSTEM:  Renal stones: None.  Ureteral stones: None.  Bladder stones: None.  Solid / enhancing renal mass: None. Left interpolar cyst with thin  peripheral calcification. Additional subcentimeter hypodensities, too small  to characterize but statistically likely benign.  Renal enhancement: Normal symmetric enhancement of the  kidneys.  There is segmental nonopacification of the bilateral distal ureters.  Hydronephrosis: None.  Hydroureter: None.  Renal collecting system: No intraluminal filling defects.  Ureters: No intraluminal filling defects in the opacified segments of the  ureters. Segments of the ureter that are not opacified do not show any wall  thickening, suspicion of an enhancing mass, or focal or segmental  dilatation.  Urinary bladder: No focal or asymmetric bladder wall thickening, focal  filling defects, or enhancing polypoid lesions.    REMAINING ABDOMEN AND PELVIS:  Lower Chest: Heavy coronary artery calcification. Bibasilar atelectasis.  Liver: Unchanged hepatic steatosis and hepatomegaly.  Bile Ducts: Unremarkable.  Gallbladder: Unremarkable.  Pancreas: Unremarkable.  Spleen: Unremarkable.  Adrenal Glands: Unchanged 1.8 cm left adenoma.  GI Tract: Diverticulosis without evidence of acute diverticulitis.  Peritoneal Cavity: No free fluid or free air.  Uterus and Ovaries: Status post hysterectomy. No adnexal masses.  Lymph Nodes: Unchanged 1.3 cm porta hepatic lymph node. Unchanged  additional smaller upper abdominal lymph nodes.  Major Vascular Structures: Moderate aortoiliac artery calcification.  Soft Tissues: Unchanged partially calcified left gluteal soft tissue  densities, likely injection granulomas.  Musculoskeletal: No suspicious bony lesions or acute fractures. Mild  multilevel degenerative changes of the visualized spine.    IMPRESSION:  1. No cause for hematuria. No urolithiasis, renal mass, or  uroepithelial mass in the kidneys, ureters, or urinary bladder.  2. No hydronephrosis or hydroureter.  3. Unchanged 1.8 cm left adrenal adenoma.     Impression: This is a 89yrd female with Diabetes Mellitus Type II who presents to Endocrinology for follow up. Overall, her glycemic control is at goal as evidenced by  her most recent A1c of 6.6%.  Review of her blood glucose meter download today demonstrates that she  has some variability in her blood glucose but for the most part, this is well controlled. I have not recommended any changes to her medication doses today but encouraged her to continue to work on exercise, dietary changes and to monitor her blood glucose closely. She was encouraged to report any hypoglycemia or persistent hyperglycemia prior to follow up.      She also has a history of an adrenal nodule. Hormonal evaluation was normal in 01/2016 and she will be due for repeat hormonal evaluation in 01/2017 and repeat imaging in 12/2016.      DIABETES HISTORY  (-) h/o retinopathy.      (-) h/o microalbuminuria/nephropathy.  (-) h/o neuropathy.  (-) h/o Autonomic dysfunction  (-) h/o Gastroparesis  (-) h/o CAD  (-) h/o PVD     Last eye exam: 06/2017, f/u 1year  Last urine microalbumin: 03/2017, 124  Pneumovax: 12/2014, Prevnar 2018  Influenza vaccine: 01/2017  (+) ASA  (+) Statin  (+) ACE/ARB          Recommendations:  (E11.29) Type 2 diabetes mellitus with other diabetic kidney complication  (primary encounter diagnosis)  - Continue current dose of metformin, Jardiance, glipizide and Lantus  - Encouraged dietary changes and increased exercise  - Encouraged patient to continue blood glucose monitoring to at least 1x/day, goal to check 2x/day  - Last LDL at goal, continue statin, repeat due 11/2017  - Last microalbumin:creatinine ratio elevated, continue cozaar, repeat due 03/2018  - Up to date on screening eye exam, next due 06/2018  - Repeat A1c prior to follow up appointment     2. Adrenal nodule  - Biochemical evaluation normal, repeat due in 11/2017 (~1 year from previous) for 5 years total (2022)    Approximately 30 minutes were spent with patient, greater than 50% of which was spent counseling the patient on diabetes management and on coordination of care.    Follow up in June 2019 as scheduled    If you have any questions, please do not hesitate to contact me at [phone].  Thank you for allowing me to participate  in the care of this patient.    EDUCATION:  I educated/instructed the patient or caregiver regarding all aspects of the above stated plan of care.  The patient or caregiver indicated understanding.      [place] interpreter was not used.    Report electronically signed by:  Sunday Spillers, M.D.  Clinical Assistant Professor  Department of Endocrinology  Pager # [phone]

## 2017-08-19 ENCOUNTER — Ambulatory Visit: Payer: Medicare Other

## 2017-08-19 DIAGNOSIS — F3289 Other specified depressive episodes: Secondary | ICD-10-CM

## 2017-08-19 DIAGNOSIS — F419 Anxiety disorder, unspecified: Secondary | ICD-10-CM

## 2017-08-19 DIAGNOSIS — F329 Major depressive disorder, single episode, unspecified: Secondary | ICD-10-CM

## 2017-08-19 NOTE — Progress Notes (Signed)
[place]        PATIENT INFORMATION      Patient:  Paula Daugherty  Medical Record Number:  [id number]   Date of Birth:  Dec 08, 1945   Date of Visit:  08/19/2017     Type of Visit:  Psychotherapy session   Time:  60 minutes    CPT Code:  01749      PSYCHOTHERAPY NOTE ([place])        SUBJECTIVE        Identifying Information    Paula Daugherty is a 72yrold female referred by JJamelle Haring MD for counseling related to anxiety and depression due to conflict within family.      Patient Report    Patient reports that symptoms of anxiety and depression over the last two weeks were high.  Reports it feels like many things are happening and it has become difficult to manage.    Patient  states that progress towards goals is still continuing, though feeling overwhelmed at the moment.    Patient  reports that homework assigned was completed, though feeling like she doesn't have a break since they are not taking days to rest.  Also did not feel like going on a walk to the park by the house, but has been trying to stay mindful whenever her and her husband go out somewhere.    Patient  reports physical symptoms related to medical conditions include stomach issues, likely due to stress.    Patient  reports taking medications as prescribed.    Patient  reports no SI/HI/loss of hope.      OBJECTIVE        Mental Status Exam    General Observations         Appearance  [x]  Well groomed  []  Unkempt  []  Discheveled      Build  []  Average  []  Thin  [x]  Overweight      Demeanor  [x]  Average  []  Hostile  []  Mistrustful  []  Withdrawn  []  Preoccupied  []   Demanding   Eye contact  [x]  Average  []  Avoidant  []  Intense      Motor activity  [x]  Average  []  Agitated  []  Slowed      Speech  [x]  Clear  []  Slurred  []  Rapid  []  Pressured     Thought Content         Delusions   [x]  None reported  []  Grandiose  []  Persecutory  []  Somatic  []  Bizarre  []  Nihilistic  []  Religious   Other   [x]  None reported  []  Preoccupied  []  Obsessional  []  Phobic  []  Guilty  []  Guarded  []  Ideas of reference   Self-abuse   [x]  None reported  []  Passive suicidal ideation   []  Active suicidal ideation  []  Intent  []  Plan  []  Means  []  Self-mutilation   Harm to others   [x]  None reported  []  Aggression towards others (thoughts only)  []  Intent to harm others  []  Plan to hurt others      Perception         Hallucinations   [x]  None reported  []  Auditory  []  Visual  []  Olfactory  []  Gustatory  []  Tactile    Other   [x]  None reported  []  Illusions  []  Depersonalization  []  Derealization      Thought Process           [  x] Logical  []  Circumstantial  []  Tangential  []  Loose  []  Racing  []  Incoherent     []  Concrete  []  Blocked  []  Flight of ideas      Mood           [x]  Euthymic  []  Depressed  []  Anxious  []  Angry  []  Euphoric  []  Irritable   Affect           [x]  Full  []  Constricted  []  Flat  []  Inappropriate  []  Labile    Behavior           [x]  Cooperative  []  Resistant  []  Agitated  []  Impulsive  []  Over sedated  []  Assaultive     []  Aggressive  []  Hyperactive  []  Restless  []  Loss of interest  []  Anhedonia  []  Akasthisia     []  Withdrawn  []  Dystonia  []  Tardive Dyskinesia      Cognition         Impairment of:   [x]  None reported  []  Orientation  []  Memory  []  Attention/ concentration  []  Ability to abstract     Insight/Judgment           []  Good  [x]  Fair  []  Poor            Psychotherapy content    Today's session included discussion of how persistent mindfulness and meditation can help build stress tolerance.  Additionally, discussed where to incorporate some boundaries to relieve some of  the current stress.       Medications    Outpatient Medications Prior to Visit   Medication Sig Dispense Refill    Amlodipine (NORVASC) 10 mg Tablet Take 1 tablet by mouth every day. 90 tablet 3    Atorvastatin (LIPITOR) 10 mg Tablet Take 1 tablet by mouth every day at bedtime. 90 tablet 1    Blood Sugar Diagnostic (ONETOUCH ULTRA TEST) Strips Use to test blood glucose 3x/day 300 strip 11    empagliflozin (JARDIANCE) 25 mg Tablet Take 1 tablet by mouth every day. 90 tablet 3    FLUTICASONE/SALMETEROL (ADVAIR DISKUS INHA) Take  by inhalation.      GlipiZIDE (GLUCOTROL XL) 10 mg SR Tablet Take 2 tablets by mouth every morning with a meal. (diabetes) 60 tablet 5    Insulin Glargine (LANTUS SOLOSTAR U-100 INSULIN) 100 unit/mL (3 mL) Pen Inject 22 units every night before bed. Please supply 90 day supply. 30 mL 2    Insulin Pen Needles, Disposable, (BD ULTRA-FINE III SHORT PEN NEEDLE) 31 gauge x 5/16" Use for injecting insulin 1 time per day 100 each 11    linaclotide (LINZESS) 145 mcg Capsule Take 1 capsule by mouth every day. 30 capsule 0    Losartan (COZAAR) 25 mg Tablet Take 1 tablet by mouth every day. 90 tablet 3    Magnesium Citrate Liquid Drink contents of bottle followed by at least 2 cups of water. Do this around 12 pm 2 days prior to colonoscopy. 1 bottle 0    Metformin (GLUCOPHAGE) 1,000 mg tablet Take 1 tablet by mouth 2 times daily with meals. (diabetes) 180 tablet 3    Metoclopramide (REGLAN) 5 mg Tablet Take 1 tablet by mouth 3 times daily. 30 minutes before each meal 90 tablet 0    Omeprazole (PRILOSEC) 20 mg Delayed Release Capsule TAKE 1 CAPSULE BY MOUTH TWICE DAILY BEFORE MEALS 180 capsule 1    Ondansetron (ZOFRAN) 8 mg Tablet Take 1  tablet by mouth every 8 hours if needed. 270 tablet 3    Venlafaxine ([name]) 75 mg Tablet TAKE 2 TABLETS BY MOUTH EVERY MORNING AND TAKE 1 TABLET BY MOUTH EVERY EVENING (Patient taking differently: Take 150 mg by mouth every day. TAKE 2 TABLETS BY  MOUTH EVERY MORNING AND TAKE 1 TABLET BY MOUTH EVERY EVENING          ) 270 tablet 1     No facility-administered medications prior to visit.        Changes in medications:  None noted        Therapeutic modalities used     Clinician provided active listening and motivational interviewing techniques during this session.  Provided psychoeducation regarding the beneficial effects of practicing mindfulness daily.  Discussed incorporating scheduled time to journal.      ASSESSMENT        Session summary    Clinician feels patient is progressing towards goals as evidenced by her willingness to put down some boundaries with her family where needed.    Patient has identified the following needs during this session:  Needs days to relax; will incorporate this into daily routine plan starting next week.    Clinician feels patient's safety is not at risk at this time due to a safe home environment and a supportive husband.  A higher level of care is not indicated at this time.    Clinician feels patient may benefit from including husband in next session to discuss balanced communication techniques.    No new or revised diagnoses are needed at this time.    Medication questions:  None at this time.      Assessment questionnaires    08/19/2017 PHQ-9 score:   14  08/19/2017 GAD-7 score:   12        PLAN       Will review patient case, medication questions during CoCM IDT.    Therapeutic focus will include:  Boundary setting and practicing mindfulness.   Clinician will follow up next session regarding homework (walking, scheduled journaling) and boundary setting.    Homework given:  -Aim for one to two days where you schedule time to journal.  -Aim for daily mindfulness practice.  This can be in your house, in your yard, etc.  -Try to go on two walks to the park, being present and mindful  while you're walking.  -Look at where you can put some boundaries.  (e.g. 10 minute phone calls with mom.)  -Mantra:  One day at a time.  On the tough days, one hour at a time.      EDUCATION      Provided patient psychoeducation regarding the benefits of consistent mindfulness and meditation practice.  Setting boundaries when stressors are feeling overwhelming.        Kathrene Bongo, [facility]  [facility]

## 2017-08-19 NOTE — Patient Instructions (Signed)
Homework:  -Aim for one to two days where you schedule time to journal.  -Aim for daily mindfulness practice.  This can be in your house, in your yard, etc.  -Try to go on two walks to the park, being present and mindful while you're walking.  -Look at where you can put some boundaries.  (e.g. 10 minute phone calls with mom.)  -Mantra:  One day at a time.  On the tough days, one hour at a time.

## 2017-08-21 ENCOUNTER — Other Ambulatory Visit: Payer: Self-pay | Admitting: "Endocrinology

## 2017-08-21 ENCOUNTER — Encounter: Payer: Self-pay | Admitting: "Endocrinology

## 2017-08-21 DIAGNOSIS — E1121 Type 2 diabetes mellitus with diabetic nephropathy: Secondary | ICD-10-CM

## 2017-08-21 MED ORDER — BLOOD-GLUCOSE METER KIT
1.0000 | PACK | Freq: Once | 0 refills | Status: AC
Start: 2017-08-21 — End: 2018-08-16

## 2017-08-21 NOTE — Telephone Encounter (Signed)
From: [name]  To: Corky Mull, MD  Sent: 08/20/2017 5:18 PM PDT  Subject: Visit Follow-up Question    Hi Dr, sorry to bother you but I have a bit of a problem. I have lost my test meter since I last saw on the 12th. Is it possible for you to order one for me? I have been using the One Touch Ultra. That is what the strips I have now are for.   Also I checked my juice bars for carbs. Most are 12 or 14. One is 15. 1 just bought the yogurt one too. My small market didn't have plain greek yogurt but will get some when we come to town. Wow that sounds old.lol  Have a great weekend.   Paula Daugherty

## 2017-08-25 ENCOUNTER — Ambulatory Visit: Payer: Medicare Other | Admitting: Family Medicine

## 2017-08-26 ENCOUNTER — Ambulatory Visit: Payer: Medicare Other

## 2017-09-02 ENCOUNTER — Ambulatory Visit
Admission: RE | Admit: 2017-09-02 | Discharge: 2017-09-02 | Disposition: A | Discharge status: Home / Self Care | Payer: Medicare Other | Source: Ambulatory Visit | Attending: GASTROENTEROLOGY | Admitting: GASTROENTEROLOGY

## 2017-09-02 DIAGNOSIS — K5909 Other constipation: Secondary | ICD-10-CM | POA: Insufficient documentation

## 2017-09-02 HISTORY — PX: MANOMETRY: GILAB00008

## 2017-09-02 HISTORY — DX: Other constipation: K59.09

## 2017-09-02 NOTE — Nurse Procedure (Signed)
Procedure: 3D Rectal Manometry    Manometry probe calibrated. Vital signs obtained and documented.  Risks , benefits and alternatives of  manometry discussed with the patient including, but not limited to,  the risks of perforation, bleeding, discomfort. Patient has agreed to the procedure and all questions answered.  Patient queried for procedure contraindications including,  but not limited to, rectal varices and none were identified.      Manometry probe calibrated. Sheath and attached balloon applied followed manufacturer standard guidelines.  Patient placed in left lateral position. Rectal exam performed. Patient asked to perform squeeze, relaxation, and [place] maneuvers prior to rectal intubation. Rectal Manometry probe intubation performed. Rectal probe was secured.  Manometry wave amplitude captured with each manuver.  Probe completely removed with balloon intact. Patient discharged in no apparent distress.

## 2017-09-09 ENCOUNTER — Ambulatory Visit: Payer: Medicare Other

## 2017-09-09 DIAGNOSIS — F3289 Other specified depressive episodes: Secondary | ICD-10-CM

## 2017-09-09 DIAGNOSIS — F329 Major depressive disorder, single episode, unspecified: Secondary | ICD-10-CM

## 2017-09-09 DIAGNOSIS — F419 Anxiety disorder, unspecified: Secondary | ICD-10-CM

## 2017-09-09 NOTE — Progress Notes (Signed)
[place]        PATIENT INFORMATION      Patient:  Paula Daugherty  Medical Record Number:  [id number]   Date of Birth:  [birth year]   Date of Visit:  09/09/2017     Type of Visit:  Psychotherapy session   Time:  60 minutes    CPT Code:  96789      PSYCHOTHERAPY NOTE ([place])        SUBJECTIVE        Identifying Information    Paula Daugherty is a 72yrold female referred by JJamelle Haring MD for counseling related to anxiety and depression related to family stressors.      Patient Report    Patient reports that symptoms of anxiety and depression this week were about the same.    Patient states that progress towards goals is moving forward.    Patient reports that homework assigned was completed.  Went out for a walk, practiced mindfulness activities.    Patient reports taking medications as prescribed.    Patient states that sleep/activity level/appetite was about the same.    Patient reports no SI/HI/loss of hope.      OBJECTIVE        Mental Status Exam    General Observations         Appearance  _0  Well groomed  _1  Unkempt  _2  Discheveled      Build  _3  Average  _4  Thin  _5  Overweight      Demeanor  _6  Average  _7  Hostile  _8  Mistrustful  _9  Withdrawn  _10  Preoccupied  _11  Demanding   Eye contact  _12  Average  _13  Avoidant  _14  Intense      Motor activity  _15  Average  _16  Agitated  _17  Slowed      Speech  _18  Clear  _19  Slurred  _20  Rapid  _21  Pressured     Thought Content         Delusions   _22  None reported  _23  Grandiose  _24  Persecutory  _25  Somatic  _26  Bizarre  _27  Nihilistic  _28  Religious   Other   _29  None reported  _30  Preoccupied  _31   Obsessional  _32  Phobic  _33  Guilty  _34  Guarded  _35  Ideas of reference   Self-abuse   _36  None reported  _37  Passive suicidal ideation   _38  Active suicidal ideation  _39  Intent  _40  Plan  _41  Means  _42  Self-mutilation   Harm to others   _43  None reported  _44  Aggression towards others (thoughts only)  _45  Intent to harm others  _46  Plan to hurt others      Perception         Hallucinations   _47  None reported  _48  Auditory  _49  Visual  _50  Olfactory  _51  Gustatory  _52  Tactile    Other   _53  None reported  _54  Illusions  _55  Depersonalization  _56  Derealization      Thought Process           _57  Logical  _58  Circumstantial  _59  Tangential  _60  Loose  _61  Racing  _62  Incoherent     _63  Concrete  _64  Blocked  _65  Flight of ideas      Mood           _66  Euthymic  _67  Depressed  _68  Anxious  _69  Angry  _70  Euphoric  _71  Irritable   Affect           [  x] Full  _0  Constricted  _1  Flat  _2  Inappropriate  _3  Labile    Behavior           _4  Cooperative  _5  Resistant  _6  Agitated  _7  Impulsive  _8  Over sedated  _9  Assaultive     _10  Aggressive  _11  Hyperactive  _12  Restless  _13  Loss of interest  _14  Anhedonia  _15  Akasthisia     _16  Withdrawn  _17  Dystonia  _18  Tardive Dyskinesia      Cognition         Impairment of:   _19  None reported  _20  Orientation  _21  Memory  _22  Attention/ concentration  _23  Ability to abstract     Insight/Judgment           _24  Good  _25  Fair  _26  Poor            Psychotherapy content    Today's session included discussion of how the last several weeks had gone, and how she has been managing her anxiety and depression.  Discussed how things that happen during our childhood can sometimes stick with [place] throughout our adult lives.  Talked about how some of the ideas she developed in childhood have been brought forth into her marriage.         Medications    Outpatient Medications Prior to Visit   Medication Sig Dispense Refill    Amlodipine (NORVASC) 10 mg Tablet Take 1 tablet by mouth every day. 90 tablet 3    Atorvastatin (LIPITOR) 10 mg  Tablet Take 1 tablet by mouth every day at bedtime. 90 tablet 1    Blood Glucose Meter (ONETOUCH ULTRA2) Kit 1 kit one time. 1 kit 0    Blood Sugar Diagnostic (ONETOUCH ULTRA TEST) Strips Use to test blood glucose 3x/day 300 strip 11    empagliflozin (JARDIANCE) 25 mg Tablet Take 1 tablet by mouth every day. 90 tablet 3    FLUTICASONE/SALMETEROL (ADVAIR DISKUS INHA) Take  by inhalation.      GlipiZIDE (GLUCOTROL XL) 10 mg SR Tablet Take 2 tablets by mouth every morning with a meal. (diabetes) 60 tablet 5    Insulin Glargine (LANTUS SOLOSTAR U-100 INSULIN) 100 unit/mL (3 mL) Pen Inject 22 units every night before bed. Please supply 90 day supply. 30 mL 2    Insulin Pen Needles, Disposable, (BD ULTRA-FINE III SHORT PEN NEEDLE) 31 gauge x 5/16" Use for injecting insulin 1 time per day 100 each 11    linaclotide (LINZESS) 145 mcg Capsule Take 1 capsule by mouth every day. 30 capsule 0    Losartan (COZAAR) 25 mg Tablet Take 1 tablet by mouth every day. 90 tablet 3    Magnesium Citrate Liquid Drink contents of bottle followed by at least 2 cups of water. Do this around 12 pm 2 days prior to colonoscopy. 1 bottle 0    Metformin (GLUCOPHAGE) 1,000 mg tablet Take 1 tablet by mouth 2 times daily with meals. (diabetes) 180 tablet 3    Omeprazole (PRILOSEC) 20 mg Delayed Release Capsule TAKE 1 CAPSULE BY MOUTH TWICE DAILY BEFORE MEALS 180 capsule 1    Ondansetron (ZOFRAN) 8 mg Tablet Take 1 tablet by mouth every 8 hours if needed. 270 tablet 3    Venlafaxine ([name]) 75 mg Tablet TAKE 2 TABLETS BY MOUTH EVERY MORNING AND TAKE 1 TABLET BY MOUTH EVERY EVENING (Patient taking differently: Take 150 mg by mouth every day. TAKE 2 TABLETS BY MOUTH EVERY MORNING AND TAKE 1  TABLET BY MOUTH EVERY EVENING          ) 270 tablet 1     No facility-administered medications prior to visit.        Changes in medications:  None noted        Therapeutic modalities used     Clinician provided active listening and motivational  interviewing techniques during this session.        ASSESSMENT        Session summary    Clinician feels patient is progressing towards goals as evidenced by her willingness to complete homework assigned.  States she feels a bit better.    Clinician feels patient's safety is not at risk at this time due to a safe home environment and a supportive husband.  A higher level of care is not indicated at this time.    Clinician feels patient may benefit from couples counseling for her and her spouse; Also potentially being connected to her community a bit more via volunteer work, etc.    No new or revised diagnoses are needed at this time.    Medication questions:  None at this time.      Assessment questionnaires    09/09/2017 PHQ-9 score:   13  09/09/2017 GAD-7 score:   10        PLAN       Therapeutic focus will include:  Managing anxiety and depression going forward.   Clinician will follow up next session regarding how homework went; preparation for visiting mom.    Homework given:  Write out a list of things that make her feel loved to help improve communication and connection with husband.      EDUCATION      Provided patient psychoeducation regarding addiction, and cognitive schemas.        Kathrene Bongo, [place] [facility]  [facility]

## 2017-09-09 NOTE — Patient Instructions (Signed)
Homework:  Write out a list of things that make her feel loved to help improve communication and connection with husband.

## 2017-09-11 ENCOUNTER — Encounter: Payer: Self-pay | Admitting: Adult Health

## 2017-09-11 NOTE — Telephone Encounter (Signed)
Will forward to NP for review and response.  Thank you. Meg Xolani Degracia RN

## 2017-09-11 NOTE — Telephone Encounter (Signed)
From: [name]  To: Ma Hillock, NP  Sent: 09/11/2017 2:16 PM PDT  Subject: Test Result Question    Hi David, hope your time off was for good things. I was wondering about the test you ordered and I completed on May 1st. I haven't heard anything about the results. Maybe that is because you were gone but I have been wondering. Having lots of stomach issues. Vomiting this week. Bowel movements change in frequency and consistency every couple of days. It is depressing to have all this going on and never feeling quite right. Hope to hear from you.   Paula Daugherty

## 2017-09-14 ENCOUNTER — Encounter: Payer: Self-pay | Admitting: Family Medicine

## 2017-09-14 ENCOUNTER — Other Ambulatory Visit: Payer: Self-pay | Admitting: Adult Health

## 2017-09-14 DIAGNOSIS — R198 Other specified symptoms and signs involving the digestive system and abdomen: Secondary | ICD-10-CM

## 2017-09-14 DIAGNOSIS — K5909 Other constipation: Secondary | ICD-10-CM

## 2017-09-14 NOTE — Procedures (Signed)
Pre-Procedure Time Out Checklist  (do not remove)     Attestation:  ID verified by two sources (select any two from list): MRN and Name  Was this an emergency procedure?  no     I attest that I verified the following information prior to performing the procedure: Procedure     Date of Service: 5/1/20019    Patient: Paula Daugherty  LOCATION: [facility]  MR #: [id number]  SEX: female  AGE: 37yr DOB: [id number]     PROCEDURE: High-resolution anorectal monometry study    REFERRING PHYSICIAN: JJamelle Haring MD and DJeral Pinch NP  PCP: JJamelle Haring MD    ATTENDING PHYSICIAN FOR THE PROCEDURE: JLarrie Kass MD for interpretation.    PROCEDURE TECHNICIAN: CRubin Payor RN    PREOPERATIVE DIAGNOSES:   Chronic Constipation    POSTOPERATIVE DIAGNOSES:   The sphincter length is Normal.   The rectal sphincter resting pressure is Normal.  The rectal sphincter squeeze pressures is Below Normal.  Relaxation of the sphincter during the bear-down maneuvers is present.  Rectal pressure during cough effort: normal.  Rectoanal inhibitory reflex is present  Sensorial perception is abnormal.  Rectal balloon expulsion: not done due to 3D rectal probe structure    CONSENT:  Risks, benefits and alternatives to a high-resolution anorectal manometry study have been discussed with the patient including, but not limited to, the risks of bleeding, pain, perforation. A timeout was performed by nursing and medical staff to identify the patient (using two patient identifiers) and appropriate procedure. The patient was agreeable to proceed and did not have any questions.    MEDICATIONS:  None    PROCEDURE:  High resolution anorectal manometry studies were performed at the [facility]. A high-resolution catheter with ten circumferential sensors, at 0.6 cm spacings, positioned to simultaneously span the entire rectal sphincter from the anal verge to beyond the border of the proximal border of the sphincter into the  rectum was used. An additional two circumferential sensors are positioned in a balloon that is at the tip of the catheter, and this balloon is at the level of the rectum to simultaneously measure the abdominal pressure activity, simultaneously assess the sphincter and abdominal pressure responses to standard maneuvers, which include resting pressure, squeeze pressures, bear-down maneuvers, rectoanal inhibitory reflex, cough effort, rectal sensorial perception, with determination of the first sensation, constant sensation and maximum tolerable volume.     Estimated blood loss: none    Complications: none    Specimens: none    FINDINGS:  Review of the tracings revealed that:  The length of the high-pressure zone is 2.7 cm, with normal being >2 cm.    The mean sphincter resting pressure was 46.9 mmHg, with normal being 40-80 mmHg.    The maximum sphincter squeeze pressure was 86 mmHg, with normal being more than double the resting pressure or beyond the threshold of 100 mmHg.    Relaxation during the bear-down maneuvers is present. This is consistent with normal response.    During the cough efforts, there is pressure increase, consistent with emergency continence mechanism present.    The rectoanal inhibitory reflex, elicted by insufflating a balloon in the rectum at intervals of 10 cc at the time up to 60 cc total was present at 10 cc of balloon insufflation.    During the determination of the sensorial perception, the patient had the first sensation at 50 cc of balloon insufflation, with normal occurrence at 10-20  cc of balloon insufflation. The constant sensation occurred at 90 cc of balloon insufflation. The maximum tolerable volume occurred at 120 cc of balloon insufflation. This is consistent with delayed sensorial perception.    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

## 2017-09-15 ENCOUNTER — Encounter: Payer: Self-pay | Admitting: Family Medicine

## 2017-09-23 ENCOUNTER — Encounter: Payer: Self-pay | Admitting: Family Medicine

## 2017-09-23 ENCOUNTER — Ambulatory Visit: Payer: Medicare Other | Admitting: Family Medicine

## 2017-09-23 VITALS — BP 138/76 | HR 75 | Temp 98.1°F | Resp 12 | Wt 172.4 lb

## 2017-09-23 DIAGNOSIS — C7A8 Other malignant neuroendocrine tumors: Secondary | ICD-10-CM

## 2017-09-23 DIAGNOSIS — I209 Angina pectoris, unspecified: Secondary | ICD-10-CM

## 2017-09-23 DIAGNOSIS — E1121 Type 2 diabetes mellitus with diabetic nephropathy: Secondary | ICD-10-CM

## 2017-09-23 DIAGNOSIS — E279 Disorder of adrenal gland, unspecified: Secondary | ICD-10-CM

## 2017-09-23 DIAGNOSIS — E278 Other specified disorders of adrenal gland: Secondary | ICD-10-CM

## 2017-09-23 HISTORY — DX: Angina pectoris, unspecified: I20.9

## 2017-09-23 NOTE — Progress Notes (Signed)
Paula Daugherty is a 30yrfemale who presents with a chief complaint of "friend passed away".    Chief Complaint   Patient presents with    Diabetes       1. Passing of her friend: in his 825s with multiple medical problems; [place]   2. DM2: happy with control; working on weight loss with less caloric intake; activity not seeming as much right now   3. Stressors: mother with reported demanding attitude; daughter who does not speak with patient; seeing Lauren for counseling; no SI or HI     ROS:   .Constitutional: no fever/chills.  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 90 tablet 3    Atorvastatin (LIPITOR) 10 mg Tablet Take 1 tablet by mouth every day at bedtime. 90 tablet 1    Blood Glucose Meter (ONETOUCH ULTRA2) Kit 1 kit one time. 1 kit 0    Blood Sugar Diagnostic (ONETOUCH ULTRA TEST) Strips Use to test blood glucose 3x/day 300 strip 11    empagliflozin (JARDIANCE) 25 mg Tablet Take 1 tablet by mouth every day. 90 tablet 3    FLUTICASONE/SALMETEROL (ADVAIR DISKUS INHA) Take  by inhalation.      GlipiZIDE (GLUCOTROL XL) 10 mg SR Tablet Take 2 tablets by mouth every morning with a meal. (diabetes) 60 tablet 5    Insulin  Glargine (LANTUS SOLOSTAR U-100 INSULIN) 100 unit/mL (3 mL) Pen Inject 22 units every night before bed. Please supply 90 day supply. 30 mL 2    Insulin Pen Needles, Disposable, (BD ULTRA-FINE III SHORT PEN NEEDLE) 31 gauge x 5/16" Use for injecting insulin 1 time per day 100 each 11    linaclotide (LINZESS) 145 mcg Capsule Take 1 capsule by mouth every day. 30 capsule 0    Losartan (COZAAR) 25 mg Tablet Take 1 tablet by mouth every day. 90 tablet 3    Magnesium Citrate Liquid Drink contents of bottle followed by at least 2 cups of water. Do this around 12 pm 2 days prior to colonoscopy. 1 bottle 0    Metformin (GLUCOPHAGE) 1,000 mg tablet Take 1 tablet by mouth 2 times daily with meals. (diabetes) 180 tablet 3    Omeprazole (PRILOSEC) 20 mg Delayed Release Capsule TAKE 1 CAPSULE BY MOUTH TWICE DAILY BEFORE MEALS 180 capsule 1    Ondansetron (ZOFRAN) 8 mg Tablet Take 1 tablet by mouth every 8 hours if needed. 270 tablet 3    Venlafaxine (  [name]) 75 mg Tablet TAKE 2 TABLETS BY MOUTH EVERY MORNING AND TAKE 1 TABLET BY MOUTH EVERY EVENING (Patient taking differently: Take 150 mg by mouth every day. TAKE 2 TABLETS BY MOUTH EVERY MORNING AND TAKE 1 TABLET BY MOUTH EVERY EVENING          ) 270 tablet 1     No current facility-administered medications on file prior to visit.      Allergies:   Allergies   Allergen Reactions    Contrast Dye [Radiopaque Agent] Hives    Hydrochlorothiazide Other-Reaction in Comments     Patient reports    Januvia [Sitagliptin] Other-Reaction in Comments     Patient reports    Lyrica [Pregabalin] Other-Reaction in Comments     Patient reports    Omnipaque [Iohexol] Other-Reaction in Comments     Patient reports    Prozac [Fluoxetine Hcl] Other-Reaction in Comments     Patient reports    Sulfa (Sulfonamide Antibiotics) Rash    Topiramate Other-Reaction in Comments     Hairfall       Social History     Socioeconomic History    Marital status: MARRIED     Spouse name: Not on file     Number of children: 1    Years of education: Not on file    Highest education level: Not on file   Occupational History    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 [employer]    Social Needs    Financial resource strain: Not on file    Food insecurity:     Worry: Not on file     Inability: Not on file    Transportation needs:     Medical: Not on file     Non-medical: Not on file   Tobacco Use    Smoking status: Former Smoker     Packs/day: 0.10     Years: 20.00     Pack years: 2.00    Smokeless tobacco: Never Used    Tobacco comment: quit 30 years ago   Substance and Sexual Activity    Alcohol use: Yes     Alcohol/week: 0.0 oz     Comment: rare shares a beer or glass wine every 2-3 months     Drug use: No    Sexual activity: Yes     Partners: Male   Lifestyle    Physical activity:     Days per week: Not on file     Minutes per session: Not on file    Stress: Not on file   Relationships    Social connections:     Talks on phone: Not on file     Gets together: Not on file     Attends religious service: Not on file     Active member of club or organization: Not on file     Attends meetings of clubs or organizations: Not on file     Relationship status: Not on file    Intimate partner violence:     Fear of current or ex partner: Not on file     Emotionally abused: Not on file     Physically abused: Not on file     Forced sexual activity: Not on file   Other Topics Concern    Not on file   Social History Narrative    Not on file     Family History   Problem Relation Name Age of Onset    Diabetes Father  Heart Mother          MI at 35s     Non-contributory Brother      Non-contributory Brother         PE:  BP 138/76 (SITE: right arm, Orthostatic Position: sitting, Cuff Size: regular)   Pulse 75   Temp 36.7 C (98.1 F) (Temporal)   Resp 12   Wt 78.2 kg (172 lb 6.4 oz)   LMP 05/06/1983   BMI 31.53 kg/m   .General Appearance: healthy, alert, no distress, pleasant affect, cooperative.  Heart:   normal rate and regular rhythm, no murmurs, clicks, or gallops.  Lungs: clear to auscultation.  Extremities:  no cyanosis, clubbing, or edema.  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, without long-term current use of insulin (HCC)  (primary encounter diagnosis)  Comment: at goal   Plan: to monitor     (I20.9) Angina pectoris ([place])  Comment: history of this; no apparent recent symptoms   Plan: to monitor     (E27.9) Adrenal nodule ([place])  Comment: history of this   Plan: seeing endocrine     (C7A.8) 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[place])  Comment: history of this   Plan: seeing GI       Total encounter time including history, physical examination, and coordination of care was approximately 20 minutes of which more than 50% was spent counseling regarding assessment/diagnosis and treatment plan. No guarantees were made regarding her medical care or treatment outcome. Barriers to Learning: none.  Patient verbalizes understanding of teaching and instructions.    Electronically signed by:    [name], MD  [place], [place] Board of Family Medicine  Associate physician [facility], [place]   [phone]

## 2017-09-23 NOTE — Patient Instructions (Addendum)
Please see about possibly getting Shinrix from outside pharmacy--repeat due after 2 mos with 1 booster to complete series of 2.     A laboratory test has been ordered for you.   No fasting.

## 2017-09-23 NOTE — Nursing Note (Signed)
Patient roomed, chief complaint noted, allergies verified, blood pressure, pulse, respiration, and weight obtained, screened for pain, and pharmacy verified.  Francelia Mclaren, M.A.

## 2017-09-30 ENCOUNTER — Ambulatory Visit: Payer: Medicare Other

## 2017-10-19 ENCOUNTER — Encounter: Payer: Self-pay | Admitting: DERMATOLOGY

## 2017-10-19 ENCOUNTER — Ambulatory Visit: Payer: Medicare Other | Attending: Family Medicine | Admitting: DERMATOLOGY

## 2017-10-19 ENCOUNTER — Encounter

## 2017-10-19 ENCOUNTER — Ambulatory Visit (INDEPENDENT_AMBULATORY_CARE_PROVIDER_SITE_OTHER): Payer: Medicare Other

## 2017-10-19 ENCOUNTER — Ambulatory Visit: Payer: Medicare Other | Admitting: Clinical

## 2017-10-19 DIAGNOSIS — E119 Type 2 diabetes mellitus without complications: Secondary | ICD-10-CM | POA: Insufficient documentation

## 2017-10-19 DIAGNOSIS — L821 Other seborrheic keratosis: Secondary | ICD-10-CM

## 2017-10-19 DIAGNOSIS — L820 Inflamed seborrheic keratosis: Secondary | ICD-10-CM | POA: Insufficient documentation

## 2017-10-19 LAB — HEMOGLOBIN A1C
Hgb A1C,Glucose Est Avg: 148 mg/dL
Hgb A1C: 6.8 % — ABNORMAL HIGH (ref 3.9–5.6)

## 2017-10-19 NOTE — Progress Notes (Signed)
Patient is a 85yrfemale accompanied by her husband last seen in the dermatology clinic on 07/24/16 by Dr. LVenita Lickhere for follow-up visit.      Today, the patient's chief concern is a growth below her left eye.      Patient has a prior history of acne necrotica of the scalp treated with triamcinolone 0.1% cream.    Patient denies any personal history of skin cancer or melanoma.    Questionnaire dated 10/19/2017 reviewed by MD today.    Past Medical History: Intestinal cancer, depression, DM, HTN, hepatitis and allergies.    Medications and Allergies: reviewed by MD today     Family History:                                             There is not sure of a family history of skin cancer    Social History:  The patient does not smoke or use tobacco products. Reviewed 10/19/2017    Review of Systems: The patient has been asked about current: fever, fatigue, weight loss, visual changes, wheezing, cough, hay fever/pollen allergy, headaches, sore throat, bleeding gums, joint or muscle pain, shortness of breath, chest pain, lymph node swelling, infections, pains in calves, nausea, vomiting, diarrhea, burning with urination, depression/anxiety, numbness/tingling, or dizziness.    Positives are: Fatigue, weight loss, hay fever/pollen allergy, joint or muscle pain, nausea, vomiting, depression/anxiety. All other ROS are negative.     Physical Examination:    A full body skin exam was done.    BP 153/83 (SITE: right arm, Orthostatic Position: sitting, Cuff Size: regular)   Pulse 75   Wt 77.2 kg (170 lb 3.1 oz)   LMP 05/06/1983   BMI 31.13 kg/m   On physical examination, the patient is well developed, well nourished, and pleasant with normal affect. The patient is alert and oriented to time, place, and person.    The following areas were inspected and the findings were normal except for the following pertinent abnormal findings listed in the integrated HPI/Exam/Assessment/Plan section below:    Head/scalp including  palpation, face, eyes, eyelids and conjunctiva, nose, mouth/oral mucosa, lips, neck including palpation, chest (including the breasts and axillae), abdomen, buttocks/groin, back, right and left upper extremities, digit, nail, and the right and left lower extremities, digits, and nails. Digits/nails were inspected and palpated (for clubbing, cyanosis, inflammation). The lower extremities were inspected for peripheral swelling, varicosities.     Husband was present during the entire examination.    Impression/Problems/Plan:   1.  Dx: Irritated seborrheic keratosis, left infraorbital.      C/O: Growth present for several weeks with associated itching and irritation from repeated rubbing of her glasses.      PE: Hyperkeratotic flesh colored papule.      Plan:   -  After obtaining verbal consent 1 lesion(s) were treated with liquid nitrogen. Patient was informed of the potential for erythema and blister formation at the site of treatment.  Patient tolerated the procedure well without complication.  -  Patient informed that repeat treatment may be required.    2.  Dx: Seborrheic keratoses, trunk.  C/O: Asymptomatic spots, no associated itching, pain or bleeding, persistent in nature, stable in size and appearance      PE: stuck on hyperkeratotic papules       Plan:   Patient reassured of the benign  nature of the lesions.  No treatment is indicated at this time.       Education needs were addressed and there were no barriers to learning.       Miguel Rota, MD, DDS  Director, [facility]  Professor of Clinical Dermatology

## 2017-10-19 NOTE — Nursing Note (Signed)
Patient vitals checked, allergies verified, pharmacy verified and patient screened for pain.  By Mikhael Hendriks, MA

## 2017-10-21 ENCOUNTER — Encounter: Payer: Self-pay | Admitting: "Endocrinology

## 2017-10-21 ENCOUNTER — Encounter: Payer: Self-pay | Admitting: Family Medicine

## 2017-10-21 ENCOUNTER — Ambulatory Visit
Admission: RE | Admit: 2017-10-21 | Discharge: 2017-10-21 | Disposition: A | Discharge status: Home / Self Care | Payer: Medicare Other | Source: Ambulatory Visit | Attending: GASTROENTEROLOGY | Admitting: GASTROENTEROLOGY

## 2017-10-21 ENCOUNTER — Encounter: Payer: Self-pay | Admitting: DERMATOLOGY

## 2017-10-21 DIAGNOSIS — R198 Other specified symptoms and signs involving the digestive system and abdomen: Secondary | ICD-10-CM | POA: Insufficient documentation

## 2017-10-21 DIAGNOSIS — Z1239 Encounter for other screening for malignant neoplasm of breast: Secondary | ICD-10-CM

## 2017-10-21 DIAGNOSIS — K5909 Other constipation: Secondary | ICD-10-CM | POA: Insufficient documentation

## 2017-10-21 DIAGNOSIS — L702 Acne varioliformis: Secondary | ICD-10-CM

## 2017-10-21 HISTORY — PX: BIOFEEDBACK PERI/URO/RECTAL: AMBPRO0204

## 2017-10-21 NOTE — Telephone Encounter (Signed)
Please see my chart message. Thank you.   Rome Schlauch, LVN

## 2017-10-21 NOTE — Telephone Encounter (Signed)
From: [name]  To: Corky Mull, MD  Sent: 10/20/2017 5:01 PM PDT  Subject: Test Result Question    Hi Doc, just wanted you to know they drew my A1C yesterday. Came back 6.8. See you the end of this month.   Take care, Paula Daugherty

## 2017-10-21 NOTE — Telephone Encounter (Signed)
From: [name]  To: Miguel Rota, MD  Sent: 10/20/2017 4:59 PM PDT  Subject: Visit Follow-up Question    Hello Dr., just checking to see if you ordered the medication we discussed at my visit yesterday. Some type of ointment/cream. I haven't heard anything from the pharmacy.   Paula Daugherty

## 2017-10-21 NOTE — Nurse Procedure (Signed)
NMR session #1 completed and patient tolerated procedure well.  Patient instructed to perform a minimum of 60 rectal Kegel exercises daily, with or without using anal sensor, in an effort to improve rectal sphincter tone.

## 2017-10-21 NOTE — Telephone Encounter (Signed)
From: [name]  To: [name], MD  Sent: 10/19/2017 7:38 PM PDT  Subject: Preventive Care    Hi Dr. I went to schedule my mammogram today but they I do not have an order for one. They told me to contact you.   Thanks, Paula Daugherty

## 2017-10-22 MED ORDER — CLINDAMYCIN 1 % TOPICAL GEL
TOPICAL | 2 refills | Status: DC
Start: 2017-10-22 — End: 2018-04-20

## 2017-10-22 NOTE — Telephone Encounter (Signed)
Please see my chart message. Thank you.   Mikaelyn Arthurs, LVN

## 2017-10-23 ENCOUNTER — Ambulatory Visit: Payer: Medicare Other | Admitting: Clinical

## 2017-10-23 DIAGNOSIS — F419 Anxiety disorder, unspecified: Secondary | ICD-10-CM

## 2017-10-23 DIAGNOSIS — F331 Major depressive disorder, recurrent, moderate: Secondary | ICD-10-CM

## 2017-10-23 DIAGNOSIS — F329 Major depressive disorder, single episode, unspecified: Secondary | ICD-10-CM

## 2017-10-23 NOTE — Progress Notes (Signed)
[place]        PATIENT INFORMATION      Patient:  Paula Daugherty  Medical Record Number:  [id number]   Date of Birth:  Feb 23, 1946   Date of Visit:  10/23/2017     Type of Visit:  Psychotherapy session   Time:  60 minutes    CPT Code:  42683      PSYCHOTHERAPY NOTE ([place])        SUBJECTIVE        Identifying Information    Paula Daugherty is a 72yrold female referred by JJamelle Haring MD for counseling related to depression and anxiety due to family stressors.      Patient Report    Patient reports that symptoms of anxiety and depression this week were fairly high due to various family issues.    Patient reports that homework assigned was completed.    Patient reports taking medications as prescribed.    Patient states that sleep has increased a lot lately.  Activity level and appetite were about the same.    Patient reports no SI/HI/loss of hope.      OBJECTIVE        Mental Status Exam    General Observations         Appearance  [x]  Well groomed  []  Unkempt  []  Discheveled      Build  []  Average  []  Thin  [x]  Overweight      Demeanor  [x]  Average  []  Hostile  []  Mistrustful  []  Withdrawn  []  Preoccupied  []  Demanding   Eye contact  [x]  Average  []  Avoidant  []  Intense      Motor activity  [x]  Average  []  Agitated  []  Slowed      Speech  [x]  Clear  []  Slurred  []  Rapid  []  Pressured     Thought Content         Delusions   [x]  None reported  []  Grandiose  []  Persecutory  []  Somatic  []  Bizarre  []  Nihilistic  []  Religious   Other   [x]  None reported  []  Preoccupied  []  Obsessional  []  Phobic  []  Guilty  []  Guarded  []  Ideas of  reference   Self-abuse   [x]  None reported  []  Passive suicidal ideation   []  Active suicidal ideation  []  Intent  []  Plan  []  Means  []  Self-mutilation   Harm to others   [x]  None reported  []  Aggression towards others (thoughts only)  []  Intent to harm others  []  Plan to hurt others      Perception         Hallucinations   [x]  None reported  []  Auditory  []  Visual  []  Olfactory  []  Gustatory  []  Tactile    Other   [x]  None reported  []  Illusions  []  Depersonalization  []  Derealization      Thought Process           [x]  Logical  []  Circumstantial  []  Tangential  []  Loose  []  Racing  []  Incoherent     []  Concrete  []  Blocked  []  Flight of ideas      Mood           [x]  Euthymic, though anxious at times  []  Depressed  []  Anxious  []  Angry  []  Euphoric  []  Irritable   Affect           [  x] Full  []  Constricted  []  Flat  []  Inappropriate  []  Labile    Behavior           [x]  Cooperative  []  Resistant  []  Agitated  []  Impulsive  []  Over sedated  []  Assaultive     []  Aggressive  []  Hyperactive  []  Restless  []  Loss of interest  []  Anhedonia  []  [place]ia     []  Withdrawn  []  Dystonia  []  Tardive Dyskinesia      Cognition         Impairment of:   []  None reported  []  Orientation  []  Memory  []  Attention/ concentration  []  Ability to abstract     Insight/Judgment           []  Good  [x]  Fair  []  Poor            Psychotherapy content    Today's session included discussion of how her previous few months have been.  Patient reports a lot has gone on, and she is feeling stressed and guilty that she may not be supporting her family enough.  Discussed our own personal limits and how we are all responsible for our own selves, and can only control what we do and how we respond to things.  Discussed incorporating a morning routine again to get things back onto a schedule.         Medications    Outpatient Medications Prior to Visit   Medication Sig Dispense Refill    Amlodipine (NORVASC) 10 mg Tablet Take 1 tablet by mouth every  day. 90 tablet 3    Atorvastatin (LIPITOR) 10 mg Tablet Take 1 tablet by mouth every day at bedtime. 90 tablet 1    Blood Glucose Meter (ONETOUCH ULTRA2) Kit 1 kit one time. 1 kit 0    Blood Sugar Diagnostic (ONETOUCH ULTRA TEST) Strips Use to test blood glucose 3x/day 300 strip 11    Clindamycin (CLEOCIN-T) 1 % Gel Apply to sores in the scalp twice daily as needed with flaring. 30 g 2    empagliflozin (JARDIANCE) 25 mg Tablet Take 1 tablet by mouth every day. 90 tablet 3    FLUTICASONE/SALMETEROL (ADVAIR DISKUS INHA) Take  by inhalation.      GlipiZIDE (GLUCOTROL XL) 10 mg SR Tablet Take 2 tablets by mouth every morning with a meal. (diabetes) 60 tablet 5    Insulin Glargine (LANTUS SOLOSTAR U-100 INSULIN) 100 unit/mL (3 mL) Pen Inject 22 units every night before bed. Please supply 90 day supply. 30 mL 2    Insulin Pen Needles, Disposable, (BD ULTRA-FINE III SHORT PEN NEEDLE) 31 gauge x 5/16" Use for injecting insulin 1 time per day 100 each 11    linaclotide (LINZESS) 145 mcg Capsule Take 1 capsule by mouth every day. 30 capsule 0    Losartan (COZAAR) 25 mg Tablet Take 1 tablet by mouth every day. 90 tablet 3    Magnesium Citrate Liquid Drink contents of bottle followed by at least 2 cups of water. Do this around 12 pm 2 days prior to colonoscopy. 1 bottle 0    Metformin (GLUCOPHAGE) 1,000 mg tablet Take 1 tablet by mouth 2 times daily with meals. (diabetes) 180 tablet 3    Omeprazole (PRILOSEC) 20 mg Delayed Release Capsule TAKE 1 CAPSULE BY MOUTH TWICE DAILY BEFORE MEALS 180 capsule 1    Ondansetron (ZOFRAN) 8 mg Tablet Take 1 tablet by mouth every 8 hours if needed. 270 tablet 3  Venlafaxine ([name]) 75 mg Tablet TAKE 2 TABLETS BY MOUTH EVERY MORNING AND TAKE 1 TABLET BY MOUTH EVERY EVENING (Patient taking differently: Take 150 mg by mouth every day. TAKE 2 TABLETS BY MOUTH EVERY MORNING AND TAKE 1 TABLET BY MOUTH EVERY EVENING          ) 270 tablet 1     No facility-administered medications  prior to visit.        Changes in medications:  None noted        Therapeutic modalities used     Clinician provided active listening and motivational interviewing techniques during this session.        ASSESSMENT        Session summary    Clinician feels patient may benefit from continued boundaries with her family members, and concentrating on self-care activities.  Having more of a structured schedule may help in creating some purpose and reducing some of the depressive symptoms patient is experiencing.    No new or revised diagnoses are needed at this time.    Medication questions:  Patient is interested in a medication evaluation.      Assessment questionnaires    10/23/2017 PHQ-9 score:   15  10/23/2017 GAD-7 score:   13        PLAN       Will review patient case, medication questions during CoCM IDT.    Therapeutic focus will include:  Behavioral activation, self-care   Clinician will follow up next session regarding homework assigned.    Homework given:    --Behavioral activation sheet completed for the week.  Keep tasks small so they feel a bit more doable as we are gaining some momentum in breaking the cycle of depression.  --Look in to the caregiver group some additional support.      EDUCATION      Provided patient psychoeducation regarding the importance of behavioral activation in breaking the cycle of depression.        Kathrene Bongo, [place] [facility]  [facility]

## 2017-10-23 NOTE — Patient Instructions (Signed)
Homework given:    --Behavioral activation sheet completed for the week.  Keep tasks small so they feel a bit more doable as we are gaining some momentum in breaking the cycle of depression.  --Look in to the caregiver group some additional support.

## 2017-10-24 ENCOUNTER — Other Ambulatory Visit: Payer: Self-pay | Admitting: "Endocrinology

## 2017-10-24 DIAGNOSIS — E119 Type 2 diabetes mellitus without complications: Secondary | ICD-10-CM

## 2017-10-26 NOTE — Procedures (Signed)
Pre-Procedure Time Out Checklist  (do not remove)     Attestation:  ID verified by two sources (select any two from list): MRN and Name  Was this an emergency procedure?  no     I attest that I verified the following information prior to performing the procedure: Procedure     Date of Service: 10/21/2017    Patient: Paula Daugherty  LOCATION: [facility]  MR #: [id number]  SEX: female  AGE: 68yr DOB: [birth year]   OPERATION RECORD    PROCEDURE: Pelvic Floor neuromuscular retraining session    PRIMARY CARE PROVIDER:   PCP: JJamelle Haring MD    REFERRING PHYSICIAN:   NJamelle Haring MD and DJeral Pinch NP    ATTENDING PHYSICIAN:   JLarrie Kass MD.  I was present for the interpretation.     PREOPERATIVE DIAGNOSES:   Chronic constipation; Weak anal sphincter squeeze    POSTOPERATIVE DIAGNOSES:  Successful completion of session 1 of neuromuscular retraining session.    CONSENT:  Risks, benefits and alternatives to a pelvic floor retraining session have been discussed with the patient including, but not limited to, the risk of pain, bleeding. A timeout was performed by nursing and medical staff to identify the patient (using two patient identifiers) and appropriate procedure. The patient is agreeable to proceed and did not have any questions.    PROCEDURE:  The study was performed at the [facility]. The patient was monitored throughout the entire procedure. An electromyography ( EMG) study of the pelvic musculature was performed with an interval sensor monitoring the levator ani muscles and surface sensors monitoring the abdominals (or other accesory muscles). The sensor is introduced into the rectum. The patient is educated regarding , brething, relaxation techniques as well how to perform proper contractions and relaxation maneuvers. This procedure first evaluate the ability of the phasic ( fast twitch) muscles to make quick contractions on demand. Next the tonic (slow twitch) and phasic EMG  levator ani muscle activity is measured during the relax and contract sequences. The abdominal muscles are monitored to ensure the isolation of the levator ani muscles during this evaluation.  The results of the procedure appears below. The N Score or net score is the mean microvolt value during contraction sequences less the mean microvolt value during the resting sequences.  An increase in the N score will correlate to improvement of levator ani muscle function as well as reduced incidence of incontinence episodes.    Activity         Mean uV values  Kegel Contract        15.2  Kegel relax   9.8  N-score   5.4    Estimated blood loss: none    Complications: none    Specimens: none    The N score is baseline.    RECOMMENDATION:  Continue neuromuscular retraining sessions  Continue Kegel exercises, as previously instructed    DAssunta Gambles NP  Division of Gastroenterology  PI# [id number]

## 2017-10-29 ENCOUNTER — Other Ambulatory Visit: Payer: Self-pay | Admitting: "Endocrinology

## 2017-10-29 DIAGNOSIS — E119 Type 2 diabetes mellitus without complications: Secondary | ICD-10-CM

## 2017-10-29 MED ORDER — GLIPIZIDE ER 10 MG TABLET, EXTENDED RELEASE 24 HR
20.0000 mg | EXTENDED_RELEASE_CAPSULE | Freq: Every day | ORAL | 5 refills | Status: DC
Start: 2017-10-29 — End: 2017-10-30

## 2017-10-29 NOTE — Telephone Encounter (Signed)
Last visit: 08/14/2017. Future 10/30/2017

## 2017-10-30 ENCOUNTER — Encounter: Payer: Self-pay | Admitting: "Endocrinology

## 2017-10-30 ENCOUNTER — Ambulatory Visit: Payer: Medicare Other | Admitting: "Endocrinology

## 2017-10-30 ENCOUNTER — Telehealth: Payer: Self-pay | Admitting: Clinical

## 2017-10-30 VITALS — BP 146/84 | HR 73 | Temp 98.4°F | Wt 172.8 lb

## 2017-10-30 DIAGNOSIS — Z794 Long term (current) use of insulin: Secondary | ICD-10-CM

## 2017-10-30 DIAGNOSIS — E1129 Type 2 diabetes mellitus with other diabetic kidney complication: Secondary | ICD-10-CM

## 2017-10-30 DIAGNOSIS — E279 Disorder of adrenal gland, unspecified: Secondary | ICD-10-CM

## 2017-10-30 DIAGNOSIS — E785 Hyperlipidemia, unspecified: Secondary | ICD-10-CM

## 2017-10-30 DIAGNOSIS — E119 Type 2 diabetes mellitus without complications: Secondary | ICD-10-CM

## 2017-10-30 DIAGNOSIS — E278 Other specified disorders of adrenal gland: Secondary | ICD-10-CM

## 2017-10-30 MED ORDER — GLIPIZIDE ER 10 MG TABLET, EXTENDED RELEASE 24 HR
20.0000 mg | EXTENDED_RELEASE_CAPSULE | Freq: Every day | ORAL | 3 refills | Status: DC
Start: 2017-10-30 — End: 2018-09-20

## 2017-10-30 NOTE — Patient Instructions (Signed)
1. Please increase Lantus to 24 units daily    2. No changes to other medications today    3. Due for repeat 24 hour urine tests now      To do a 24-hour urine collection:  1. At 7:00 AM, urinate into the toilet. Do not save this urine.  2. The next time you need to urinate, start to collect all the urine in the jug provided. Continue collecting the urine every time you urinate, in the jug, for the next 24 hours until the same time on the following day.  3. Between collections, put the jug in a plastic bag, then a paper bag, and keep in the refrigerator or a styrofoam ice chest filled with ice.  4. Urinate at 7:00 AM the next day to finish the collection, and put that urine in the jug.  5. Return the urine to the lab the same day that you finish the urine collection.    To avoid false-positive results for catecholamines and metanephrines,  avoid these drugs  5 days before the 24-hour urine collection:   acetaminophen/tylenol   benzodiazepines   buspirone   catecholamines   cocaine   diuretics   dopamine   dopaminergic agents   labetalol   levodopa   metoclopramide   methyldopa   sympathomimetics   tricyclic antidepressants   vasodilators    Avoid the following for 1 day before the urine collection:   alcohol   bananas   caffeine   chocolate   nicotine   vanilla

## 2017-10-30 NOTE — Nursing Note (Signed)
Vital signs taken, allergies verified, screened for pain, tobacco hx verified.     Pahola Dimmitt, MA

## 2017-10-30 NOTE — Telephone Encounter (Signed)
Lauren Mahakian left message for patient to reschedule her appointment on November 06, 2017, she will not be in clinic.     Genia Harold, Supervisor  [facility]  [phone]

## 2017-10-30 NOTE — Progress Notes (Signed)
Endocrinology Follow up clinic note:    Chief Complaint:  "I'm here to follow up on my diabetes and adrenal nodule."    HPI: Paula Daugherty is a 72yrold female who has a diagnosis of type 2 diabetes mellitus who presents to Endocrinology for follow up. Her history is as follows:  She was initially diagnosed with diabetes around 2000 on routine labs. She has a strong family history of diabetes so she wasn't surprised at this. She was started on Metformin around 2005 and then she was started on insulin in 2014. She was up to 64 units of Lantus at night. However, after making changes to her diet and lifestyle, she was taken off insulin in 2015. Basal insulin was added back to her regimen in 2018.      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     Interim History: She returns today for follow up. Her last visit with Endocrinology was on 08/14/2017.  Since this visit, she states that things have been ok. She has had some occasional high blood sugar readings that seem to happen more in the morning time with occasional low readings that will happen more in the afternoon when she has been more active.     Current Regimen:    Jardiance 25 mg daily   Glipizide 20 mg daily   Lantus 22 units QHS   Metformin 1000 mg BID  Insulin injection technique: Holds the needle in for a few seconds with each injection  Injection sites: Abdomen  Rotates sites: Yes  Discards insulin 30 days after opening: Yes  Hypoglycemia: Yes, lowest recorded 71, seems to happen more in the afternoon  Hyperglycemia: Blood sugar runs highest in the mornings    Meter brought today: Yes  Home blood glucose log brought to clinic today: No  Checks blood glucose: 2x/day  Home blood glucose numbers per pt's log:   Blood Glucose:    Pre-lunch: 129 to 206    Pre-lunch: 71 to 153    Pre-dinner: 195  Last eye exam: 06/29/2017, no retinopathy, f/u 1 year  Foot exam: Will do today      ROS:  All other systems were reviewed and are  negatative except for pertinent positive and negative responses as documented in HPI.     Medications:  Medication reconciliation was performed today.   Current Outpatient Medications on File Prior to Visit   Medication Sig Dispense Refill    Amlodipine (NORVASC) 10 mg Tablet Take 1 tablet by mouth every day. 90 tablet 3    Atorvastatin (LIPITOR) 10 mg Tablet Take 1 tablet by mouth every day at bedtime. 90 tablet 1    Blood Glucose Meter (ONETOUCH ULTRA2) Kit 1 kit one time. 1 kit 0    Blood Sugar Diagnostic (ONETOUCH ULTRA TEST) Strips Use to test blood glucose 3x/day 300 strip 11    Clindamycin (CLEOCIN-T) 1 % Gel Apply to sores in the scalp twice daily as needed with flaring. 30 g 2    empagliflozin (JARDIANCE) 25 mg Tablet Take 1 tablet by mouth every day. 90 tablet 3    FLUTICASONE/SALMETEROL (ADVAIR DISKUS INHA) Take  by inhalation.      Insulin Glargine (LANTUS SOLOSTAR U-100 INSULIN) 100 unit/mL (3 mL) Pen Inject 22 units every night before bed. Please supply 90 day supply. 30 mL 2    Insulin Pen Needles, Disposable, (BD ULTRA-FINE III SHORT PEN NEEDLE) 31 gauge x 5/16" Use for injecting  insulin 1 time per day 100 each 11    linaclotide (LINZESS) 145 mcg Capsule Take 1 capsule by mouth every day. 30 capsule 0    Losartan (COZAAR) 25 mg Tablet Take 1 tablet by mouth every day. 90 tablet 3    Magnesium Citrate Liquid Drink contents of bottle followed by at least 2 cups of water. Do this around 12 pm 2 days prior to colonoscopy. 1 bottle 0    Metformin (GLUCOPHAGE) 1,000 mg tablet Take 1 tablet by mouth 2 times daily with meals. (diabetes) 180 tablet 3    Omeprazole (PRILOSEC) 20 mg Delayed Release Capsule TAKE 1 CAPSULE BY MOUTH TWICE DAILY BEFORE MEALS 180 capsule 1    Ondansetron (ZOFRAN) 8 mg Tablet Take 1 tablet by mouth every 8 hours if needed. 270 tablet 3    Venlafaxine ([name]) 75 mg Tablet TAKE 2 TABLETS BY MOUTH EVERY MORNING AND TAKE 1 TABLET BY MOUTH EVERY EVENING (Patient taking  differently: Take 150 mg by mouth every day. TAKE 2 TABLETS BY MOUTH EVERY MORNING AND TAKE 1 TABLET BY MOUTH EVERY EVENING          ) 270 tablet 1     No current facility-administered medications on file prior to visit.        I did review patient's past medical and family/social history, no changes noted.   PMH:  Past medical history was reviewed from problem list.   Patient Active Problem List   Diagnosis    DM2 (diabetes mellitus, type 2) ([facility])    Depression with anxiety    Stress at home    Leg cramps-right calf    Right ear pain    Fibromyalgia    HTN (hypertension)    GERD (gastroesophageal reflux disease)    Hyperlipidemia with target LDL less than 70    Vitamin D insufficiency    Abnormal LFTs    Renal cyst ( 6.4 cm cyst left kidney)    Cough    Fatty liver    Macroalbuminuric diabetic nephropathy (HCC)    History of actinic keratoses    Cataract    Ptosis of eyelid    Liver fibrosis    Adrenal nodule (HCC)    Medication Therapy Auth    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HCC)    Mixed conductive and sensorineural hearing loss of both ears    Neoplasm of uncertain behavior of skin    Abdominal pain, generalized    Nausea without vomiting    Asymptomatic microscopic hematuria    Subcutaneous nodule    Angina pectoris (HCC)     VITAL SIGNS:  BP 146/84 (SITE: right arm, Orthostatic Position: sitting, Cuff Size: regular)   Pulse 73   Temp 36.9 C (98.4 F)   Wt 78.4 kg (172 lb 13.5 oz)   LMP 05/06/1983   SpO2 95%   BMI 31.61 kg/m   Body mass index is 31.61 kg/m.    PHYSICAL EXAM:  General Appearance: healthy, alert, no distress, pleasant affect, cooperative.  Eyes:  conjunctivae and corneas clear. PERRL, EOM's intact. sclerae normal.  Mouth: normal.  Neck:  Neck supple.   Heart:  normal rate and regular rhythm, no murmurs, clicks, or gallops.  Lungs: clear to auscultation.  Abdomen: BS normal.  Abdomen soft, non-tender.  No masses or organomegaly.  Extremities:  no  cyanosis, clubbing, or edema.  Foot Exam: normal DP and PT pulses, no trophic changes or ulcerative lesions, normal sensory exam and normal monofilament  exam.  Skin:  Skin color, texture, turgor normal. No rashes or lesions.  Neuro: Gait normal. Reflexes normal and symmetric. Sensation and strength grossly normal.  Mental Status: Appearance/Cooperation: in no apparent distress and well developed and well nourished  Eye Contact: normal  Speech: normal volume, rate, and pitch    LAB TESTS/STUDIES:   I personally reviewed the following laboratory and imaging studies.     06/29/2017 14:54 07/29/2017 10:22 10/19/2017 09:38   SODIUM 142     POTASSIUM 3.8     CHLORIDE 103     CARBON DIOXIDE TOTAL 25     UREA NITROGEN, BLOOD (BUN) 17     CREATININE BLOOD 1.15     GLUCOSE 145 (H)     CALCIUM 9.5     PROTEIN 8.0     ALBUMIN 3.7     ALKALINE PHOSPHATASE (ALP) 137 (H)     ASPARTATE TRANSAMINASE (AST) 50 (H)     BILIRUBIN TOTAL 1.0     ALANINE TRANSFERASE (ALT) 46     E-GFR, AFRICAN AMERICAN 56     E-GFR, NON-AFRICAN AMERICAN 49     HGB A1C 6.6 (H)  6.8 (H)   HGB A1C,GLUCOSE EST AVG 143  148      11/11/2016 11:09   CHOLESTEROL 179   TRIGLYCERIDE 206 (H)   LDL CHOLESTEROL CALCULATION 84   HDL CHOLESTEROL 54   NON-HDL CHOLESTEROL 125   TOTAL CHOLESTEROL:HDL RATIO 3.3   ALDOSTERONE,BLOOD 16.6   HGB A1C 7.8 (H)   HGB A1C,GLUCOSE EST AVG 177   RENIN ACTIVITY 1.1      06/29/2017 14:54   WHITE BLOOD CELL COUNT 10.9   HEMOGLOBIN 12.7   HEMATOCRIT 41.0   MCV 78.7 (L)   RDW 17.4 (H)   PLATELET COUNT 299      03/06/2017 11:07   CREATININE SPOT URINE 113.00   MICROALBUMIN URINE 14.0   MICROALBUMIN/CREATININE RATIO 124 (H)     11/18/2016:      Ref Range & Units 63moago        TOTAL VOLUME mL 1650     TIME OF COLLECTION hr 24     DOPAMINE,UR PER VOLUME ug/L 41     DOPAMINE,UR PER 24HR 77 - 324 ug/d 68 (L)     DOPAMINE,UR RATIO TO CRT 0 - 250 ug/g CRT 73     NOREPINEPHRINE,UR PER VOLUME ug/L 21     NOREPINEPHRINE,UR PER 24HR 16 - 71 ug/d 35      NOREPINEPHRINE,UR RATIO TO CRT 0 - 45 ug/g CRT 38     EPINEPHRINE,UR PER VOLUME ug/L <1     EPINEPHRINE,UR PER 24 HR 1 - 7 ug/d <2     EPINEPHRINE,UR RATIO TO CRT 0 - 20 ug/g CRT <2            Ref Range & Units 185mogo        TOTAL VOLUME mL 1650     TIME OF COLLECTION hr 24     CORTISOL,UR FREE PER VOLUME ug/L 16.50     CORTISOL,UR FREE PER 24HR <=45.0 ug/d 27.2     CORTISOL,UR FREE RATIO TO CRT ug/g CRT 29.46            Ref Range & Units 65m52moo        TOTAL VOLUME mL 1650     TIME OF COLLECTION hr 24     METANEPHRINE,UR-PER VOLUME ug/L 61     METANEPHRINE,UR-PER 24HR 39 - 143  ug/d 101     METANEPHRINE,UR-RATIO TO CRT 0 - 300 ug/g CRT 109     NORMETANEPHRINE,UR-PER VOLUME ug/L 304     NORMETANEPHRINE,UR-PER 24HR 109 - 393 ug/d 502 (H)     NORMETANEPHRIN,UR-RATIO TO CRT 0 - 400 ug/g CRT 543 (H)     METANEPHRINES INTERPRETATION   See Note     CREATININE,UR PER VOLUME mg/dL 56     CREATININE,UR PER 24HR 500 - 1400 mg/d 924        CT Abdomen (04/08/2017):  FINDINGS:  URINARY COLLECTING SYSTEM:  Renal stones: None.  Ureteral stones: None.  Bladder stones: None.  Solid / enhancing renal mass: None. Left interpolar cyst with thin  peripheral calcification. Additional subcentimeter hypodensities, too small  to characterize but statistically likely benign.  Renal enhancement: Normal symmetric enhancement of the kidneys.  There is segmental nonopacification of the bilateral distal ureters.  Hydronephrosis: None.  Hydroureter: None.  Renal collecting system: No intraluminal filling defects.  Ureters: No intraluminal filling defects in the opacified segments of the  ureters. Segments of the ureter that are not opacified do not show any wall  thickening, suspicion of an enhancing mass, or focal or segmental  dilatation.  Urinary bladder: No focal or asymmetric bladder wall thickening, focal  filling defects, or enhancing polypoid lesions.    REMAINING ABDOMEN AND PELVIS:  Lower Chest: Heavy coronary artery calcification.  Bibasilar atelectasis.  Liver: Unchanged hepatic steatosis and hepatomegaly.  Bile Ducts: Unremarkable.  Gallbladder: Unremarkable.  Pancreas: Unremarkable.  Spleen: Unremarkable.  Adrenal Glands: Unchanged 1.8 cm left adenoma.  GI Tract: Diverticulosis without evidence of acute diverticulitis.  Peritoneal Cavity: No free fluid or free air.  Uterus and Ovaries: Status post hysterectomy. No adnexal masses.  Lymph Nodes: Unchanged 1.3 cm porta hepatic lymph node. Unchanged  additional smaller upper abdominal lymph nodes.  Major Vascular Structures: Moderate aortoiliac artery calcification.  Soft Tissues: Unchanged partially calcified left gluteal soft tissue  densities, likely injection granulomas.  Musculoskeletal: No suspicious bony lesions or acute fractures. Mild  multilevel degenerative changes of the visualized spine.    IMPRESSION:  1. No cause for hematuria. No urolithiasis, renal mass, or  uroepithelial mass in the kidneys, ureters, or urinary bladder.  2. No hydronephrosis or hydroureter.  3. Unchanged 1.8 cm left adrenal adenoma.    Impression: This is a 72yrold female  with Diabetes Mellitus Type II who presents to Endocrinology for follow up. Overall, her glycemic control is at goal as evidenced by her most recent A1c of 6.8%.  Review of her blood glucose meter download today demonstrates that her blood glucose tends to run the highest in the mornings. Because of this, I have recommended a slight increase in her Lantus dose.    She was encouraged to report any hypoglycemia or persistent hyperglycemia prior to follow up.      She also has a history of an adrenal nodule. Hormonal evaluation was normal in 01/2016 and she will be due for repeat hormonal evaluation in 11/2017.      DIABETES HISTORY  (-) h/o retinopathy.      (-) h/o microalbuminuria/nephropathy.  (-) h/o neuropathy.  (-) h/o Autonomic dysfunction  (-) h/o Gastroparesis  (-) h/o CAD  (-) h/o PVD     Last eye exam: 06/2017, f/u 1year  Last urine  microalbumin: 03/2017, 124  Pneumovax: 12/2014, Prevnar 2018  Influenza vaccine: 01/2017  (+) ASA  (+) Statin  (+) ACE/ARB  Recommendations:  (E11.29) Type 2 diabetes mellitus with other diabetic kidney complication  (primary encounter diagnosis)  - Continue current dose of metformin, Jardiance, and glipizide  - Increase Lantus to 24 units QHS  - Encouraged dietary changes and increased exercise  - Encouraged patient to continue blood glucose monitoring to at least 1x/day, goal to check 2x/day  - Last LDL at goal, continue statin, repeat due 11/2017  - Last microalbumin:creatinine ratio elevated, continue cozaar, repeat due 03/2018  - Up to date on screening eye exam, next due 06/2018  - Repeat A1c prior to follow up appointment     2. Adrenal nodule  - Biochemical evaluation normal in 11/2016, stability by imaging in 04/2017  - Repeat hormonal evaluation due in 11/2017 (~1 year from previous) for 5 years total (2022) - urine studies ordered today    Approximately 30 minutes were spent with patient, greater than 50% of which was spent counseling the patient on diabetes management and adrenal nodule monitoring and on coordination of care.    Follow up in 3 months    If you have any questions, please do not hesitate to contact me at [phone].  Thank you for allowing me to participate in the care of this patient.    EDUCATION:  I educated/instructed the patient or caregiver regarding all aspects of the above stated plan of care.  The patient or caregiver indicated understanding.      [place] interpreter was not used.    Report electronically signed by:  Sunday Spillers, M.D.  Clinical Assistant Professor  Department of Endocrinology  Pager # [phone]

## 2017-11-02 NOTE — Telephone Encounter (Signed)
Patient called in, HIPAA/3x identifiers verified. (Name, DOB, address, and/or last 4 of SSN)  Rescheduled appointment to 7/19 at 1015.

## 2017-11-04 ENCOUNTER — Ambulatory Visit
Admission: RE | Admit: 2017-11-04 | Discharge: 2017-11-04 | Disposition: A | Discharge status: Home / Self Care | Payer: Medicare Other | Source: Ambulatory Visit | Attending: GASTROENTEROLOGY | Admitting: GASTROENTEROLOGY

## 2017-11-04 DIAGNOSIS — K5909 Other constipation: Secondary | ICD-10-CM | POA: Insufficient documentation

## 2017-11-04 DIAGNOSIS — R198 Other specified symptoms and signs involving the digestive system and abdomen: Secondary | ICD-10-CM | POA: Insufficient documentation

## 2017-11-04 HISTORY — PX: BIOFEEDBACK PERI/URO/RECTAL: AMBPRO0204

## 2017-11-04 NOTE — Nurse Procedure (Signed)
NMR session #2 completed.  Patient encouraged to continue with daily rectal Kegel exercises as instructed.

## 2017-11-06 ENCOUNTER — Ambulatory Visit: Payer: Medicare Other | Admitting: Clinical

## 2017-11-10 NOTE — Procedures (Signed)
Pre-Procedure Time Out Checklist  (do not remove)     Attestation:  ID verified by two sources (select any two from list): MRN and Name  Was this an emergency procedure?  no     I attest that I verified the following information prior to performing the procedure: Procedure     Date of Service: 11/04/2017    Patient: Paula Daugherty  LOCATION: [facility]  MR #: [id number]  SEX: female  AGE: 79yr DOB: 9Jan 06, 1947   OPERATION RECORD    PROCEDURE: Pelvic Floor neuromuscular retraining session    PRIMARY CARE PROVIDER:   PCP: JJamelle Haring MD    REFERRING PHYSICIAN:   JJamelle Haring MD    ATTENDING PHYSICIAN:   JLarrie Kass MD.  I was present for the interpretation.     PREOPERATIVE DIAGNOSES:   Constipation; Weak sphincter squeeze    POSTOPERATIVE DIAGNOSES:  Successful completion of session 2 of neuromuscular retraining session.    CONSENT:  Risks, benefits and alternatives to a pelvic floor retraining session have been discussed with the patient including, but not limited to, the risk of pain, bleeding. A timeout was performed by nursing and medical staff to identify the patient (using two patient identifiers) and appropriate procedure. The patient is agreeable to proceed and did not have any questions.    PROCEDURE:  The study was performed at the [facility]. The patient was monitored throughout the entire procedure. An electromyography ( EMG) study of the pelvic musculature was performed with an interval sensor monitoring the levator ani muscles and surface sensors monitoring the abdominals (or other accesory muscles). The sensor is introduced into the rectum. The patient is educated regarding , brething, relaxation techniques as well how to perform proper contractions and relaxation maneuvers. This procedure first evaluate the ability of the phasic ( fast twitch) muscles to make quick contractions on demand. Next the tonic (slow twitch) and phasic EMG levator ani muscle activity is  measured during the relax and contract sequences. The abdominal muscles are monitored to ensure the isolation of the levator ani muscles during this evaluation.  The results of the procedure appears below. The N Score or net score is the mean microvolt value during contraction sequences less the mean microvolt value during the resting sequences.  An increase in the N score will correlate to improvement of levator ani muscle function as well as reduced incidence of incontinence episodes.    Activity         Mean uV values  Kegel Contract        9.7  Kegel relax   4.4  N-score   5.4    Estimated blood loss: none    Complications: none    Specimens: none    The N score is unchanged from baseline.    RECOMMENDATION:  Continue neuromuscular retraining sessions  Continue Kegel exercises as instructed    DAssunta Gambles NP  Division of Gastroenterology  PI# [id number]

## 2017-11-18 ENCOUNTER — Ambulatory Visit
Admission: RE | Admit: 2017-11-18 | Discharge: 2017-11-18 | Disposition: A | Discharge status: Home / Self Care | Payer: Medicare Other | Source: Ambulatory Visit | Attending: Adult Health | Admitting: Adult Health

## 2017-11-18 DIAGNOSIS — K5900 Constipation, unspecified: Secondary | ICD-10-CM

## 2017-11-18 DIAGNOSIS — K5909 Other constipation: Secondary | ICD-10-CM | POA: Insufficient documentation

## 2017-11-18 HISTORY — PX: BIOFEEDBACK PERI/URO/RECTAL: AMBPRO0204

## 2017-11-18 NOTE — Nurse Procedure (Signed)
NMR session #3 completed.  Patient encouraged to continue with daily rectal Kegel exercises as instructed.

## 2017-11-20 ENCOUNTER — Ambulatory Visit: Payer: Medicare Other | Admitting: Clinical

## 2017-11-20 DIAGNOSIS — F419 Anxiety disorder, unspecified: Secondary | ICD-10-CM

## 2017-11-20 DIAGNOSIS — F329 Major depressive disorder, single episode, unspecified: Secondary | ICD-10-CM

## 2017-11-20 DIAGNOSIS — F331 Major depressive disorder, recurrent, moderate: Secondary | ICD-10-CM

## 2017-11-20 NOTE — Progress Notes (Signed)
[facility]        PATIENT INFORMATION      Patient:  Paula Daugherty  Medical Record Number:  [id number]   Date of Birth:  1946-02-09   Date of Visit:  11/20/2017     Type of Visit:  Psychotherapy session   Time:  60 minutes    CPT Code:  45409      PSYCHOTHERAPY NOTE ([place])        SUBJECTIVE        Identifying Information    Paula Daugherty is a 72yrold female referred by JJamelle Haring MD for counseling related to depression and anxiety.      Patient Report    Patient reports that symptoms of depression and anxiet this week were a bit improved.    Patient states that progress towards goals is moving forward.    Patient reports that homework assigned was completed.    Patient reports taking medications as prescribed.    Patient states that sleep/activity level/appetite was about the same.    Patient reports no SI/HI/loss of hope.      OBJECTIVE        Mental Status Exam    General Observations         Appearance  _0  Well groomed  _1  Unkempt  _2  Discheveled      Build  _3  Average  _4  Thin  _5  Overweight      Demeanor  _6  Average  _7  Hostile  _8  Mistrustful  _9  Withdrawn  _10  Preoccupied  _11  Demanding   Eye contact  _12  Average  _13  Avoidant  _14  Intense      Motor activity  _15  Average  _16  Agitated  _17  Slowed      Speech  _18  Clear  _19  Slurred  _20  Rapid  _21  Pressured     Thought Content         Delusions   _22  None reported  _23  Grandiose  _24  Persecutory  _25  Somatic  _26  Bizarre  _27  Nihilistic  _28  Religious   Other   _29  None reported  _30  Preoccupied  _31  Obsessional  _32  Phobic  _33  Guilty  _34  Guarded  _35  Ideas of reference   Self-abuse    _36  None reported  _37  Passive suicidal ideation   _38  Active suicidal ideation  _39  Intent  _40  Plan  _41  Means  _42  Self-mutilation   Harm to others   _43  None reported  _44  Aggression towards others (thoughts only)  _45  Intent to harm others  _46  Plan to hurt others      Perception         Hallucinations   _47  None reported  _48  Auditory  _49  Visual  _50  Olfactory  _51  Gustatory  _52  Tactile    Other   _53  None reported  _54  Illusions  _55  Depersonalization  _56  Derealization      Thought Process           _57  Logical  _58  Circumstantial  _59  Tangential  _60  Loose  _61  Racing  _62  Incoherent     _63  Concrete  _64  Blocked  _65  Flight of ideas      Mood           _66  Euthymic  _67  Depressed  _68  Anxious  _69  Angry  _70  Euphoric  _71  Irritable   Affect           _72  Full  _73  Constricted  _74   Flat  _0  Inappropriate  _1  Labile    Behavior           _2  Cooperative  _3  Resistant  _4  Agitated  _5  Impulsive  _6  Over sedated  _7  Assaultive     _8  Aggressive  _9  Hyperactive  _10  Restless  _11  Loss of interest  _12  Anhedonia  _13  Akasthisia     _14  Withdrawn  _15  Dystonia  _16  Tardive Dyskinesia      Cognition         Impairment of:   _17  None reported  _18  Orientation  _19  Memory  _20  Attention/ concentration  _21  Ability to abstract     Insight/Judgment           _22  Good  _23  Fair  _24  Poor            Psychotherapy content    Today's session included discussion of how patient's previous two weeks have been.  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  Discussed ways in which to utilize boundaries and place more emphasis on her emotional wellbeing.  Discussed how patient has used her coping skills over the last month with various situations that have come up.         Medications    Outpatient Medications Prior to Visit   Medication Sig Dispense Refill    Amlodipine (NORVASC) 10 mg Tablet Take 1 tablet by mouth every day. 90 tablet 3    Atorvastatin (LIPITOR) 10 mg Tablet Take 1 tablet by  mouth every day at bedtime. 90 tablet 1    Blood Glucose Meter (ONETOUCH ULTRA2) Kit 1 kit one time. 1 kit 0    Blood Sugar Diagnostic (ONETOUCH ULTRA TEST) Strips Use to test blood glucose 3x/day 300 strip 11    Clindamycin (CLEOCIN-T) 1 % Gel Apply to sores in the scalp twice daily as needed with flaring. 30 g 2    empagliflozin (JARDIANCE) 25 mg Tablet Take 1 tablet by mouth every day. 90 tablet 3    FLUTICASONE/SALMETEROL (ADVAIR DISKUS INHA) Take  by inhalation.      GlipiZIDE (GLUCOTROL XL) 10 mg SR Tablet Take 2 tablets by mouth every morning with a meal. (diabetes) 180 tablet 3    Insulin Glargine (LANTUS SOLOSTAR U-100 INSULIN) 100 unit/mL (3 mL) Pen Inject 22 units every night before bed. Please supply 90 day supply. 30 mL 2    Insulin Pen Needles, Disposable, (BD ULTRA-FINE III SHORT PEN NEEDLE) 31 gauge x 5/16" Use for injecting insulin 1 time per day 100 each 11    linaclotide (LINZESS) 145 mcg Capsule Take 1 capsule by mouth every day. 30 capsule 0    Losartan (COZAAR) 25 mg Tablet Take 1 tablet by mouth every day. 90 tablet 3    Magnesium Citrate Liquid Drink contents of bottle followed by at least 2 cups of water. Do this around 12 pm 2 days prior to colonoscopy. 1 bottle 0    Metformin (GLUCOPHAGE) 1,000 mg tablet Take 1 tablet by mouth 2 times daily with meals. (diabetes) 180 tablet 3    Omeprazole (PRILOSEC) 20 mg Delayed Release Capsule TAKE 1 CAPSULE BY MOUTH TWICE DAILY BEFORE MEALS 180 capsule 1    Ondansetron (ZOFRAN) 8 mg Tablet Take 1 tablet by mouth every 8 hours if needed. 270 tablet 3    Venlafaxine ([name]) 75 mg Tablet TAKE 2 TABLETS BY MOUTH EVERY MORNING  AND TAKE 1 TABLET BY MOUTH EVERY EVENING (Patient taking differently: Take 150 mg by mouth every day. TAKE 2 TABLETS BY MOUTH EVERY MORNING AND TAKE 1 TABLET BY MOUTH EVERY EVENING          ) 270 tablet 1     No facility-administered medications prior to visit.        Changes in medications:  None noted         Therapeutic modalities used     Clinician provided active listening and motivational interviewing techniques during this session.        ASSESSMENT        Session summary    Clinician feels patient is struggling a bit with boundaries with her mom due to her emotions surrounding the relationship.  She is, however, utilizing some of the coping skills discussed in session, which seems to be taking some of the edge off of her anxiety and depression.    Clinician feels patient's safety is not at risk at this time due to a safe home environment and a supportive husband.  A higher level of care is not indicated at this time.    No new or revised diagnoses are needed at this time.    Medication questions:  Other medications that may be more beneficial?      Assessment questionnaires    11/20/2017 PHQ-9 score:   12  11/20/2017 GAD-7 score:   10        PLAN       Will review patient case, medication questions during CoCM IDT.    Therapeutic focus will include:  Continuing to place boundaries where needed; managing depression through behavioral activation.      EDUCATION      Provided patient psychoeducation regarding boundaries with personal relationships.        Kathrene Bongo, [facility]  [facility] [place]

## 2017-11-25 NOTE — Procedures (Signed)
Pre-Procedure Time Out Checklist  (do not remove)     Attestation:  ID verified by two sources (select any two from list): MRN and Name  Was this an emergency procedure?  no     I attest that I verified the following information prior to performing the procedure: Procedure     Date of Service: 11/18/2017    Patient: Paula Daugherty  LOCATION: [facility]  MR #: [id number]  SEX: female  AGE: 63yr DOB: 907/19/1947   OPERATION RECORD    PROCEDURE: Pelvic Floor neuromuscular retraining session    PRIMARY CARE PROVIDER:   PCP: JJamelle Haring MD    REFERRING PHYSICIAN:   JJamelle Haring MD    ATTENDING PHYSICIAN:   JLarrie Kass MD.  I was present for the interpretation.     PREOPERATIVE DIAGNOSES:   Constipation; Weak sphincter squeeze      POSTOPERATIVE DIAGNOSES:  Successful completion of session 3 of neuromuscular retraining session.    CONSENT:  Risks, benefits and alternatives to a pelvic floor retraining session have been discussed with the patient including, but not limited to, the risk of pain, bleeding. A timeout was performed by nursing and medical staff to identify the patient (using two patient identifiers) and appropriate procedure. The patient is agreeable to proceed and did not have any questions.    PROCEDURE:  The study was performed at the [facility]. The patient was monitored throughout the entire procedure. An electromyography ( EMG) study of the pelvic musculature was performed with an interval sensor monitoring the levator ani muscles and surface sensors monitoring the abdominals (or other accesory muscles). The sensor is introduced into the rectum. The patient is educated regarding , brething, relaxation techniques as well how to perform proper contractions and relaxation maneuvers. This procedure first evaluate the ability of the phasic ( fast twitch) muscles to make quick contractions on demand. Next the tonic (slow twitch) and phasic EMG levator ani muscle activity is  measured during the relax and contract sequences. The abdominal muscles are monitored to ensure the isolation of the levator ani muscles during this evaluation.  The results of the procedure appears below. The N Score or net score is the mean microvolt value during contraction sequences less the mean microvolt value during the resting sequences.  An increase in the N score will correlate to improvement of levator ani muscle function as well as reduced incidence of incontinence episodes.    Activity         Mean uV values  Kegel Contract        7.0  Kegel relax   3.5  N-score   3.6    Estimated blood loss: none    Complications: none    Specimens: none    The N score is unchanged from baseline.    RECOMMENDATION:  Continue neuromuscular retraining sessions  Continue kegel exercises as previously instructed    DAssunta Gambles NP  Division of Gastroenterology  PI# [id number]

## 2017-12-02 ENCOUNTER — Ambulatory Visit
Admission: RE | Admit: 2017-12-02 | Discharge: 2017-12-02 | Disposition: A | Discharge status: Home / Self Care | Payer: Medicare Other | Source: Ambulatory Visit | Attending: Adult Health | Admitting: Adult Health

## 2017-12-02 DIAGNOSIS — K5909 Other constipation: Secondary | ICD-10-CM | POA: Insufficient documentation

## 2017-12-02 DIAGNOSIS — R198 Other specified symptoms and signs involving the digestive system and abdomen: Secondary | ICD-10-CM | POA: Insufficient documentation

## 2017-12-02 HISTORY — PX: BIOFEEDBACK PERI/URO/RECTAL: AMBPRO0204

## 2017-12-02 NOTE — Nurse Procedure (Signed)
NMR session #4 completed.  Patient encouraged to continue with daily rectal Kegel exercises as instructed.

## 2017-12-07 ENCOUNTER — Ambulatory Visit: Payer: Medicare Other

## 2017-12-08 ENCOUNTER — Telehealth: Payer: Self-pay | Admitting: Gastroenterology

## 2017-12-08 NOTE — Telephone Encounter (Signed)
Left message for patient to return call to confirm colonoscopy appt and discuss bowel prep instructions.  When patient returns call, please relay the following information:    This is [facility] calling about your appointment. Please read your instructions 5 days before your appointment. Please follow those instructions and not what it says on the container. Starting 5 days prior to procedure avoid high fiber foods, nuts and seeds. Also avoid Motrin, Ibuprofen, Aleve, and Naproxen. You will need to be on a clear liquid diet the day before procedure (please avoid anything red in color), black coffee, and tea with no dairy or creamer is allowed. It is important to split your prep (half the prep at 6 pm the night before your procedure and the other half 4 hours before your arrival time). It is recommended to drink more water than indicated to help clean out your colon for the procedure especially if you were prescribed Suprep. Please have nothing to drink 3 hours prior to appointment time. If you take medication for blood pressure, heart problems, or seizures, please take these with just a sip of water at least 3 hours prior to appointment time. If you plan on receiving sedation for your procedure, you must have a driver who will either wait at the clinic or be available to return within 15 minutes of a phone call. Public transportation (bus, taxi, Lyft, Surveyor, mining etc.) is not allowed. If you have any questions or need to cancel or reschedule, please call [phone].    **The prescription for your colon preparation medication is usually sent to your pharmacy a couple months in advance (when your referral is placed). If you are calling your pharmacy regarding the prescription, be sure to mention the above statement

## 2017-12-09 ENCOUNTER — Ambulatory Visit
Admission: RE | Admit: 2017-12-09 | Discharge: 2017-12-09 | Disposition: A | Discharge status: Home / Self Care | Payer: Medicare Other | Source: Ambulatory Visit | Attending: Family Medicine | Admitting: Family Medicine

## 2017-12-09 ENCOUNTER — Encounter: Payer: Self-pay | Admitting: "Endocrinology

## 2017-12-09 DIAGNOSIS — Z1239 Encounter for other screening for malignant neoplasm of breast: Secondary | ICD-10-CM

## 2017-12-09 DIAGNOSIS — Z1231 Encounter for screening mammogram for malignant neoplasm of breast: Secondary | ICD-10-CM

## 2017-12-09 NOTE — Telephone Encounter (Signed)
Patient called back advised her Margreta Journey RN called her to go over Prep instructions. Patient states she has the instructions.

## 2017-12-09 NOTE — Telephone Encounter (Signed)
I have attempted to contact this patient by phone with the following results: left message to return my call on voice mail.

## 2017-12-09 NOTE — Telephone Encounter (Signed)
From: [name]  To: Corky Mull, MD  Sent: 12/08/2017 9:12 PM PDT  Subject: Non-urgent Medical Advice Question    Hi Dr, I have my colonoscopy on the 14th. I start my liquid diet prep on the 12th. The instructions said I should get your direction for any change to my meds during that period. I will do whatever you say. Hope all is well.     Paula Daugherty

## 2017-12-09 NOTE — Telephone Encounter (Signed)
Patient returning RN call, you may reach patient @ [phone].

## 2017-12-10 ENCOUNTER — Encounter: Payer: Self-pay | Admitting: Family Medicine

## 2017-12-10 ENCOUNTER — Other Ambulatory Visit: Payer: Self-pay | Admitting: GASTROENTEROLOGY

## 2017-12-10 DIAGNOSIS — D3A00 Benign carcinoid tumor of unspecified site: Secondary | ICD-10-CM

## 2017-12-10 NOTE — Procedures (Addendum)
Pre-Procedure Time Out Checklist  (do not remove)     Attestation:  ID verified by two sources (select any two from list): MRN and Name  Was this an emergency procedure?  no     I attest that I verified the following information prior to performing the procedure: Procedure     Date of Service: 12/02/2017    Patient: Paula Daugherty  LOCATION: [facility]  MR #: [id number]  SEX: female  AGE: 89yr DOB: 9Sep 17, 1947   OPERATION RECORD    PROCEDURE: Pelvic Floor neuromuscular retraining session    PRIMARY CARE PROVIDER:   PCP: JJamelle Haring MD    REFERRING PHYSICIAN:   JJamelle Haring MD and DJeral Pinch NP    ATTENDING PHYSICIAN:   JLarrie Kass MD.  I was present for the interpretation.     PREOPERATIVE DIAGNOSES:   Chronic constipation; Weak anal sphincter squeeze    POSTOPERATIVE DIAGNOSES:  Successful completion of session 3 of neuromuscular retraining session.    CONSENT:  Risks, benefits and alternatives to a pelvic floor retraining session have been discussed with the patient including, but not limited to, the risk of pain, bleeding. A timeout was performed by nursing and medical staff to identify the patient (using two patient identifiers) and appropriate procedure. The patient is agreeable to proceed and did not have any questions.    PROCEDURE:  The study was performed at the [facility]. The patient was monitored throughout the entire procedure. An electromyography ( EMG) study of the pelvic musculature was performed with an interval sensor monitoring the levator ani muscles and surface sensors monitoring the abdominals (or other accesory muscles). The sensor is introduced into the rectum. The patient is educated regarding , brething, relaxation techniques as well how to perform proper contractions and relaxation maneuvers. This procedure first evaluate the ability of the phasic ( fast twitch) muscles to make quick contractions on demand. Next the tonic (slow twitch) and phasic EMG  levator ani muscle activity is measured during the relax and contract sequences. The abdominal muscles are monitored to ensure the isolation of the levator ani muscles during this evaluation.  The results of the procedure appears below. The N Score or net score is the mean microvolt value during contraction sequences less the mean microvolt value during the resting sequences.  An increase in the N score will correlate to improvement of levator ani muscle function as well as reduced incidence of incontinence episodes.    Activity         Mean uV values  Kegel Contract        7  Kegel relax   3.5  N-score   3.6    Estimated blood loss: none    Complications: none    Specimens: none    The N score is unchanged from baseline.    RECOMMENDATION:  Continue kegel exercises  Continue neuromuscular retraining sessions    DAssunta Gambles NP  Division of Gastroenterology  PI# [id number]

## 2017-12-14 ENCOUNTER — Encounter

## 2017-12-14 ENCOUNTER — Ambulatory Visit: Payer: Medicare Other | Admitting: DERMATOLOGY

## 2017-12-16 ENCOUNTER — Encounter: Payer: Self-pay | Admitting: Gastroenterology

## 2017-12-16 ENCOUNTER — Ambulatory Visit: Payer: Medicare Other | Attending: Cytopathology | Admitting: Gastroenterology

## 2017-12-16 VITALS — BP 153/70 | HR 76 | Temp 98.1°F | Resp 12 | Ht 62.0 in | Wt 168.9 lb

## 2017-12-16 DIAGNOSIS — K5730 Diverticulosis of large intestine without perforation or abscess without bleeding: Secondary | ICD-10-CM

## 2017-12-16 DIAGNOSIS — D122 Benign neoplasm of ascending colon: Secondary | ICD-10-CM | POA: Insufficient documentation

## 2017-12-16 DIAGNOSIS — Z86010 Personal history of colonic polyps: Secondary | ICD-10-CM | POA: Insufficient documentation

## 2017-12-16 DIAGNOSIS — K644 Residual hemorrhoidal skin tags: Secondary | ICD-10-CM | POA: Insufficient documentation

## 2017-12-16 DIAGNOSIS — K648 Other hemorrhoids: Secondary | ICD-10-CM | POA: Insufficient documentation

## 2017-12-16 DIAGNOSIS — D123 Benign neoplasm of transverse colon: Secondary | ICD-10-CM

## 2017-12-16 HISTORY — PX: COLONOSCOPY: GILAB00002

## 2017-12-16 HISTORY — PX: HC COLONOSCOPY,REMV LESN,SNARE: 750000041

## 2017-12-16 MED ORDER — FENTANYL (PF) 50 MCG/ML INJECTION SOLUTION
INTRAMUSCULAR | 0 refills | Status: DC
Start: 2017-12-16 — End: 2017-12-16

## 2017-12-16 MED ORDER — SODIUM CHLORIDE 0.9 % INTRAVENOUS SOLUTION
INTRAVENOUS | 0 refills | Status: DC
Start: 2017-12-16 — End: 2017-12-16

## 2017-12-16 MED ORDER — LIDOCAINE HCL 2 % MUCOSAL SOLUTION
0 refills | Status: DC
Start: 2017-12-16 — End: 2017-12-16

## 2017-12-16 MED ORDER — DIPHENHYDRAMINE 50 MG/ML INJECTION SOLUTION
INTRAMUSCULAR | 0 refills | Status: DC
Start: 2017-12-16 — End: 2017-12-16

## 2017-12-16 MED ORDER — MIDAZOLAM (PF) 1 MG/ML INJECTION SOLUTION
INTRAMUSCULAR | 0 refills | Status: DC
Start: 2017-12-16 — End: 2017-12-16

## 2017-12-16 NOTE — Progress Notes (Signed)
Gastroenterology/hepatology Pre-procedure History & Physical     Referring provider: [name], MD   Procedure: EGD and colonoscopy     Subjective:  HPI: Nayelli Inglis is a 72yrold female with history of duodenal carcinoid and colon TA polyps presents for Colonoscopy.    RAST:[id number] negative.  CV: negative.  Resp: negative.  GI: negative.     Allergies   Allergen Reactions    Contrast Dye [Radiopaque Agent] Hives    Hydrochlorothiazide Other-Reaction in Comments     Patient reports    Januvia [Sitagliptin] Other-Reaction in Comments     Patient reports    Lyrica [Pregabalin] Other-Reaction in Comments     Patient reports    Omnipaque [Iohexol] Other-Reaction in Comments     Patient reports    Prozac [Fluoxetine Hcl] Other-Reaction in Comments     Patient reports    Sulfa (Sulfonamide Antibiotics) Rash    Topiramate Other-Reaction in Comments     Hairfall       I did review all available medical, surgical, personal/social history.    Physical exam:  General Appearance: healthy, alert, no distress, pleasant affect, cooperative.  Mouth: normal.  Heart:  normal rate and regular rhythm  Abdomen: Abdomen soft, non-tender.  No masses or organomegaly.    Labs/imaging:  Performed at [facility] reviewed: see computer database.      Impression/ plan:  NTerena Bohanis a 89yrld female with history of duodenal carcinoid and colon TA polyps presents for Colonoscopy.  Patient is a candidate for moderate sedation.  Patient is ASA status: 2  Airway assessment: Mallampati class: 1    The procedure, risks, benefits, and alternatives were explained.  All patient questions were answered.  The informed consent was signed, and will be scanned into the computer database at a later date.    Patient barriers to learning: none  Patient/family understanding: verbalizes    Electronically Signed By:   AmRoss MarcusAttending

## 2017-12-16 NOTE — Patient Instructions (Signed)
Discharge Instructions for colonoscopy    Findings:  Benign appearing colon polyps (5), removed   Moderate diverticulosis   Small hemorrhoids     Activity:    Rest today, resume usual activitiy tomorrow     Diet:    High fiber diet and Increase fluid intake to 2 liters per day:     Medication:    Avoid Non-steroidals (such as Ibuprofen, Aleve, etc..) for at least 10 days  Take daily fiber supplementation such as Metamucil, Citrucel, etc.    Follow up:   We will notify you of your biopsy results via phone call or letter.If you do not receive notice of your results by three weeks, please call us at the phone number provided below.     Follow up in the GI clinic [phone])      During your procedure, air was pumped into your GI tract so your doctor could see clearly to make a diagnosis and/or treat your problem.     Some possible side effects you may experience are:   - Discomfort due to a distended (bloated) abdomen which will subside after a few hours to two days.   - Nausea may be a side effect of the medication and will subside.   - The medications you received may make you dizzy and sleepy, it is important that you do not drive, operate machinery or drink alcohol for at least one day.   - Severe pain is not expected and should be reported    Other side effects may include:  Flex. Sig. Or Colonoscopy: A small amount of diarrhea may follow the exam.  Polyp Removal in Colon: A small amount of bleeding is not unusual. If it continues after one day, notify the doctor. If there is a large amount of bright red blood, go to the emergency Room and be seen by a doctor.    If problems, call [facility] at [phone] during business hours Monday through Friday 7:30am to 4:30 pm.  After hours call [phone] and ask to speak to the GI Fellow on call.         What are colon polyps?    A colon polyp is a growth on the surface of the colon, also called the large intestine. Sometimes, a person can have more than one  colon polyp. Colon polyps can be raised or flat.  The large intestine is the long, hollow tube at the end of your digestive tract. The large intestine absorbs water from stool and changes it from a liquid to a solid. Stool is the waste that passes through the rectum and anus as a bowel movement.    Are colon polyps cancerous?  Some colon polyps are benign, which means they are not cancer. But some types of polyps may already be cancer or can become cancer. Flat polyps can be smaller and harder to see and are more likely to be cancer than raised polyps. Polyps can usually be removed during colonoscopy--the test used to check for colon polyps.    Who gets colon polyps?  Anyone can get colon polyps, but certain people are more likely to get them than others. You may have a greater chance of getting polyps if  you're 58 years of age or older   you've had polyps before   someone in your family has had polyps   someone in your family has had cancer of the large intestine, also called colon cancer   you've had  uterine or ovarian cancer before age 91    You may also be more likely to get colon polyps if you  eat a lot of fatty foods   smoke   drink alcohol   don't exercise   weigh too much    What are the symptoms of colon polyps?  Most people with colon polyps do not have symptoms. Often, people don't know they have one until the doctor finds it during a regular checkup or while testing for something else.  But some people do have symptoms, such as  bleeding from the anus. The anus is the opening at the end of the digestive tract where stool leaves the body. You might notice blood on your underwear or on toilet paper after you've had a bowel movement.   constipation or diarrhea that lasts more than a week.   blood in the stool. Blood can make stool look black, or it can show up as red streaks in the stool.      How does the doctor test for colon polyps?  The doctor can use one or more tests to check for colon  polyps.  Barium enema. The doctor puts a liquid called barium into your rectum before taking x rays of your large intestine. Barium makes your intestine look white in the pictures. Polyps are dark, so they're easy to see.  Sigmoidoscopy. With this test, the doctor puts a thin, flexible tube into your rectum. The tube is called a sigmoidoscope, and it has a light in it. The doctor uses the sigmoidoscope to look at the last third of your large intestine.  Colonoscopy. The doctor will give you medicine to sedate you during the colonoscopy. This test is like the sigmoidoscopy, but the doctor looks at the entire large intestine with a long, flexible tube with a camera that shows images on a TV screen. The tube has a tool that can remove polyps. The doctor usually removes polyps during colonoscopy.  Computerized tomography (CT) scan. With this test, also called virtual colonoscopy, the doctor puts a thin, flexible tube into your rectum. A machine using x rays and computers creates pictures of the large intestine that can be seen on a screen.  The CT scan takes less time than a colonoscopy because polyps are not removed during the test. If the CT scan shows polyps, you will need a colonoscopy so they can be removed.  Stool test. The doctor will ask you to bring a stool sample in a special cup. The stool is tested in the laboratory for signs of cancer, such as DNA changes or blood.    How are colon polyps treated?  In most cases, the doctor removes colon polyps during sigmoidoscopy or colonoscopy. The polyps are then tested for cancer.  If you've had colon polyps, the doctor will want you to get tested regularly in the future.    How can I prevent colon polyps?  Doctors don't know of one sure way to prevent colon polyps. But you might be able to lower your risk of getting them if you  eat more fruits and vegetables and less fatty food   don't smoke   avoid alcohol   exercise most days of the week   lose weight if you're  overweight  Eating more calcium may also lower your risk of getting polyps. Some foods that are rich in calcium are milk, cheese, yogurt, and broccoli.  Taking a low dose of aspirin every day might help prevent  polyps. Talk with your doctor before starting any medication.    Source: [employer] (NIH), [place] (NDDIC).        What is diverticular disease?    Diverticular* disease affects the colon. The colon is part of the large intestine that removes waste from your body. Diverticular disease is made up of two conditions: diverticulosis and diverticulitis. Diverticulosis occurs when pouches, called diverticula, form in the colon. These pouches bulge out like weak spots in a tire. Diverticulitis occurs if the pouches become inflamed.    What causes diverticular disease?    Doctors are not sure what causes diverticular disease. Many think a diet low in fiber is the main cause. Fiber is a part of food that your body cannot digest. It is found in many fruits and vegetables. Fiber stays in the colon and absorbs water, which makes bowel movements easier to pass. Diets low in fiber may cause constipation, which occurs when stools are hard and difficult to pass. Constipation causes your muscles to strain when you pass stool. Straining may cause diverticula to form in the colon. If stool or bacteria get caught in the pouches, diverticulitis can occur.    Is diverticular disease serious?  Most people with the disease do not have serious problems, but some people have severe symptoms. Diverticulitis can attack suddenly and cause  bleeding   serious infections   rips in the pouches   fistula, which is a connection or passage between tissues or organs in the body that normally do not connect   blockage in your digestive system   an infection in which the colon ruptures causing stool to empty from the colon into the abdomen    What are the symptoms of diverticular  disease?  The symptoms for diverticulosis and diverticulitis are different.    Diverticulosis. Many people don't have symptoms, but some people have cramping, bloating, and constipation. Some people also have bleeding, inflammation, and fistulas. If you are bleeding, bright red blood will pass through your rectum. The rectum is the end of the colon that connects to the anus. The rectum and anus are part of the gastrointestinal tract, which is the passage that food goes through. Rectal bleeding is usually painless, but it can be dangerous. You should see a doctor right away.    Diverticulitis. People with diverticulitis can have many symptoms. Often pain is felt in the lower part of the abdomen. If you have diverticulitis, you may have fevers, feel sick to your stomach, vomit, or have a change in your bowel habits.    Who gets diverticular disease?  Many people get diverticular disease. Starting at age 24, the chance of getting it increases about every 10 years. About half of people between the ages of 83 and 59 have diverticular disease. Almost everyone over 80 has it.    How does the doctor test for diverticular disease?  The doctor can test for diverticular disease many ways. A CT scan is the most common test used. The doctor will inject a liquid in a vein in your arm that better highlights your organs on x rays. You may be asked to drink liquid called barium instead of getting an injection. You are then placed in a large doughnut-shaped machine that takes x rays.  Other tests include  Medical history. The doctor will ask about your health and symptoms such as pain. You will be asked about your bowel habits, diet, and any medications you  take.  Blood test. This test can help detect infections.  Stool sample. This test may show bleeding in the digestive tract.  Digital rectal exam. The doctor will insert a gloved finger into your rectum to check for pain, bleeding, or a blockage.  X ray and barium enema. The doctor  will insert liquid called barium in the large intestine through your anus. The anus is the opening where stool leaves the body. The barium makes the diverticula show up on an x ray.  Colonoscopy. The doctor will insert a small tube through your anus. A tiny video camera is in the tube and will show if there are any pouches.    How is diverticular disease treated?  Treatment for diverticular disease depends on how serious the problem is and whether you are suffering from diverticulosis or diverticulitis. Most people get better by changing their diet. If you have rectal bleeding, you need to go to the hospital so a doctor can find the part of your colon that is bleeding. The doctor may use a special drug that makes the bleeding stop. The doctor may also decide to operate and remove the part of the colon that is bleeding.    How is diverticulosis treated?  Eating high-fiber foods can help relieve symptoms. Sometimes mild pain medications also help.    How is diverticulitis treated?  A doctor may prescribe antibiotics and recommend following a liquid diet. Most people get better with this treatment. Some people may need surgery and other treatments.  Surgery. Serious problems from diverticulitis are treated with surgery. Surgeons can clean the abdomen after infections and remove bleeding pouches and fistulas.  Colon resection. If you get diverticulitis many times, your doctor might suggest taking out the part of the colon with diverticula. The healthy sections can be joined together. With the diverticula gone, you may avoid other infections.  Emergency surgery. If you have severe problems, you may need emergency surgery to clear the infection and remove part of the colon. Later, a second surgery rejoins the healthy sections of the colon. The colon is separated for a brief time between surgeries, because rejoining the colon during the first surgery is not always safe. A temporary colostomy is needed between the two  surgeries. A colostomy is an opening made on the abdomen where a plastic bag is connected to collect stool after food is digested. The surgeon makes the opening, called a stoma, and connects it to the end of the colon.    What can I do about diverticular disease?  Eat a high-fiber diet to help prevent problems. Talk to your doctor about using fiber products like Benefiber, Citrucel, or Metamucil. Daily use can help you get the fiber you need if you do not get it through your diet.  Ask your doctor about which food choices are right for you.  Eating foods high in fiber is simple and can help reduce diverticular disease symptoms and problems.    Try eating more of the following:  Fruit. Raw apples, peaches, pears, and tangerines.   Vegetables. Fresh broccoli, squash, carrots, and brussels sprouts.   Starchy vegetables. Potatoes, baked beans, kidney beans, and lima beans.   Grains. Whole-wheat bread, brown rice, bran flake cereal, and oatmeal.    Talk with your doctor about making diet changes. Learn what to eat and how to put more of these high-fiber foods in your diet    Source: [place] National Institutes of Health (NIH), [employer], 2012.  What are hemorrhoids?    Hemorrhoids are swollen and inflamed veins around the anus or in the lower rectum. The rectum is the last part of the large intestine leading to the anus. The anus is the opening at the end of the digestive tract where bowel contents leave the body.  External hemorrhoids are located under the skin around the anus. Internal hemorrhoids develop in the lower rectum. Internal hemorrhoids may protrude, or prolapse, through the anus. Most prolapsed hemorrhoids shrink back inside the rectum on their own. Severely prolapsed hemorrhoids may protrude permanently and require treatment.    What are the symptoms of hemorrhoids?  The most common symptom of internal hemorrhoids is bright red blood on stool, on toilet paper, or  in the toilet bowl after a bowel movement. Internal hemorrhoids that are not prolapsed are usually not painful. Prolapsed hemorrhoids often cause pain, discomfort, and anal itching.  Blood clots may form in external hemorrhoids. A blood clot in a vein is called a thrombosis. Thrombosed external hemorrhoids cause bleeding, painful swelling, or a hard lump around the anus. When the blood clot dissolves, extra skin is left behind. This skin can become irritated or itch.  Excessive straining, rubbing, or cleaning around the anus may make symptoms, such as itching and irritation, worse.  Hemorrhoids are not dangerous or life threatening. Symptoms usually go away within a few days, and some people with hemorrhoids never have symptoms.    What causes hemorrhoids?  Swelling in the anal or rectal veins causes hemorrhoids. Several factors may cause this swelling, including  chronic constipation or diarrhea  straining during bowel movements  sitting on the toilet for long periods of time  a lack of fiber in the diet  Another cause of hemorrhoids is the weakening of the connective tissue in the rectum and anus that occurs with age.  Pregnancy can cause hemorrhoids by increasing pressure in the abdomen, which may enlarge the veins in the lower rectum and anus. For most women, hemorrhoids caused by pregnancy disappear after childbirth.    How are hemorrhoids diagnosed?  The doctor will examine the anus and rectum to determine whether a person has hemorrhoids. Hemorrhoid symptoms are similar to the symptoms of other anorectal problems, such as fissures, abscesses, warts, and polyps.  The doctor will perform a physical exam to look for visible hemorrhoids. A digital rectal exam with a gloved, lubricated finger and an anoscope--a hollow, lighted tube--may be performed to view the rectum.  A thorough evaluation and proper diagnosis by a doctor is important any time a person notices bleeding from the rectum or blood in the stool.  Bleeding may be a symptom of other digestive diseases, including colorectal cancer.  Additional exams may be done to rule out other causes of bleeding, especially in people age 35 or older:    Colonoscopy. A flexible, lighted tube called a colonoscope is inserted through the anus, the rectum, and the upper part of the large intestine, called the colon. The colonoscope transmits images of the inside of the rectum and the entire colon.  Sigmoidoscopy. This procedure is similar to colonoscopy, but it uses a shorter tube called a sigmoidoscope and transmits images of the rectum and the sigmoid colon, the lower portion of the colon that empties into the rectum.  Barium enema x ray. A contrast material called barium is inserted into the colon to make the colon more visible in x-ray pictures.    How are hemorrhoids treated?    At-home Treatments  Simple diet and lifestyle changes often reduce the swelling of hemorrhoids and relieve hemorrhoid symptoms. Eating a high-fiber diet can make stools softer and easier to pass, reducing the pressure on hemorrhoids caused by straining.  Fiber is a substance found in plants. The human body cannot digest fiber, but fiber helps improve digestion and prevent constipation. Good sources of dietary fiber are fruits, vegetables, and whole grains. On average, Americans eat about 15 grams of fiber each day.3 The American Dietetic Association recommends 25 grams of fiber per day for women and 38 grams of fiber per day for men.3  Doctors may also suggest taking a bulk stool softener or a fiber supplement such as psyllium (Metamucil) or methylcellulose (Citrucel).  Other changes that may help relieve hemorrhoid symptoms include  drinking six to eight 8-ounce glasses of water or other nonalcoholic fluids each day  sitting in a tub of warm water for 10 minutes several times a day  exercising to prevent constipation  not straining during bowel movements  Over-the-counter creams and suppositories  may temporarily relieve the pain and itching of hemorrhoids. These treatments should only be used for a short time because long-term use can damage the skin.    Source: [place] National Institutes of Health ([place]), [employer], 2012.

## 2017-12-16 NOTE — Nursing Note (Signed)
Nursing Pre-Procedure Assessment    Paula Daugherty arrived by ambulating  into clinic today at 0931 from home.      Patient has arranged for a ride home from husband Paula Daugherty who can be contacted at [phone]. Patient agrees to having individual providing ride home present while the post procedure instructions and results are given.  Instructed patient that post sedation they are not to drive or drink alcohol for the remainder of the day.      Verified procedure with the patient scheduled today for: Colonoscopy.    No outpatient medications have been marked as taking for the 12/16/17 encounter (Office Visit) with Ross Marcus, MD.     Anticoagulants: Patient denies taking Aspirin, NSAIDs, or other anticoagulants for the past 5 days.  OTC/Herbal preparations: see med list    Reviewed with the patient her health status in regards to allergies (including latex), medications, pregnancy, hypertension, atrial fibrilation, liver disease, bleeding disorders, diabetes,  CVA's, seizures, sleep apnea, glaucoma, cancer, prosthesis or implants, infectious diseases, surgical history; and disease of heart, lungs, liver, or kidneys. Positive history reviewed and updated in Health History section of the EMR.    Patient Active Problem List   Diagnosis    DM2 (diabetes mellitus, type 2) ([place])    Depression with anxiety    Stress at home    Leg cramps-right calf    Right ear pain    Fibromyalgia    HTN (hypertension)    GERD (gastroesophageal reflux disease)    Hyperlipidemia with target LDL less than 70    Vitamin D insufficiency    Abnormal LFTs    Renal cyst ( 6.4 cm cyst left kidney)    Cough    Fatty liver    Macroalbuminuric diabetic nephropathy (HCC)    History of actinic keratoses    Cataract    Ptosis of eyelid    Liver fibrosis    Adrenal nodule (HCC)    Medication Therapy Auth    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HCC)    Mixed conductive and sensorineural hearing loss of both ears     Neoplasm of uncertain behavior of skin    Abdominal pain, generalized    Nausea without vomiting    Asymptomatic microscopic hematuria    Subcutaneous nodule    Angina pectoris (HCC)     Allergies   Allergen Reactions    Contrast Dye [Radiopaque Agent] Hives    Hydrochlorothiazide Other-Reaction in Comments     Patient reports    Januvia [Sitagliptin] Other-Reaction in Comments     Patient reports    Lyrica [Pregabalin] Other-Reaction in Comments     Patient reports    Omnipaque [Iohexol] Other-Reaction in Comments     Patient reports    Prozac [Fluoxetine Hcl] Other-Reaction in Comments     Patient reports    Sulfa (Sulfonamide Antibiotics) Rash    Topiramate Other-Reaction in Comments     Hairfall     Past Medical History:   Diagnosis Date    Angina pectoris ([place]) 09/23/2017    Anxiety     Asthma     Cirrhosis ([place])     Constipation, chronic     Depression     Diabetes mellitus ([place])     Fatty liver     Hypertension     Kidney disease 2015    cyst left kidney per pt    Liver fibrosis     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/20/2016  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[address]) 04/17/2016    Psychiatric illness      Past Surgical History:   Procedure Laterality Date    BIOFEEDBACK PERI/URO/RECTAL  10/21/2017    session #1: N Score = 5.4    BIOFEEDBACK PERI/URO/RECTAL  11/04/2017    session #2    BIOFEEDBACK PERI/URO/RECTAL  11/18/2017    Session #3    BIOFEEDBACK PERI/URO/RECTAL  12/02/2017    Session #4    COLONOSCOPY  01/04/14    polyp--TA and erosion; hemorrhoids; tics; repeat x 3 yrs    EGD      MAMMOPLASTY, REDUCTION  1985    MANOMETRY  09/02/2017    work on constipation; kegels and biofeedback; proceed w/colonoscopy     PHACOEMULSIFICATION, CATARACT Right 02/12/2015    with IOL    PR ESOPHAGOGASTRODUODENOSCOPY TRANSORAL DIAGNOSTIC  02/12/2016    EGD--polyp--path pending; antral gastritis; no varices     PR ESOPHAGOGASTRODUODENOSCOPY TRANSORAL DIAGNOSTIC   06/11/2016    EGD--biopsy at site of carcinoid tumor path pending     PR ESOPHAGOGASTRODUODENOSCOPY TRANSORAL DIAGNOSTIC      EGD--folds in duodenum--path pending     PR ESOPHAGOGASTRODUODENOSCOPY TRANSORAL DIAGNOSTIC  04/2017    EGD; repeat EGD in 1 year     PR KNEE SCOPE,DIAGNOSTIC  1980s    Knee arthroscopy    PR LIGATE FALLOPIAN TUBE      Tubal ligation    PR REPAIR TYMPANIC MEMBRANE      Tympanoplasty    PR TOTAL ABDOM HYSTERECTOMY  1980s    partial; no BSO    REPAIR, BLEPHAROPTOSIS Bilateral 08/06/2015    Blepharoplasty bilateral upper lids     Social History     Tobacco Use   Smoking Status Former Smoker    Packs/day: 0.10    Years: 20.00    Pack years: 2.00   Smokeless Tobacco Never Used   Tobacco Comment    quit 30 years ago     Social History     Substance and Sexual Activity   Drug Use No       Pre Procedure Checks:    Identaband on and two forms of identification information verified: yes  Pt completed bowel prep: GI Procedure-split prep w/ Moviprep (2day) BM=liq yellow clear per patient.  NPO since: 0530  Belongings are with patient under gurney.  Patient has: Glasses.  Barriers to Learning assessed: none. Patient verbalizes understanding of teaching and instructions.    Past anesthesia responses:  No problem.  Nursing Assessment Data Base:  Ventilation   --  Respirations: normal, unlabored.  Circulation/Perfusion -- Skin: warm, normal color and dry.  Cognition/Communication -- Behavior: alert and oriented and cooperative.  Gastro-Intestinal: no distress.  Abdomen: soft and non-tender.  Level of Ambulation: self.    Reviewed written post procedure instructions with the patient (What To Expect After Your Procedure).   Barriers to Learning assessed: none. Patient verbalizes understanding of teaching and instructions.  Nursing assessment completed by:  Veneda Melter, RN, RN    Date: 12/16/2017 Time: [phone]

## 2017-12-18 LAB — SURGICAL PATHOLOGY

## 2017-12-21 ENCOUNTER — Encounter: Payer: Self-pay | Admitting: Gastroenterology

## 2017-12-21 NOTE — Procedures (Signed)
Patient: Paula Daugherty  Location: [facility] record number: [id number]  Sex: female  Age: 51yrs)  Date of birth: 912-25-1947   Procedure: Colonoscopy  Subprocedure:Cold snare polypectomy     Sedation start time: 1110    Referring Physician: PCP: JJamelle Haring MD    PERFORMING SURGEON: AHyman HopesAl-Juburi     GI Pre-procedure Indications:  Previous adenomatous polyp  History of duodenal carcinoid     Details of the Procedure:    Informed consent was obtained for the procedure, including sedation.  Risks of perforation, hemorrhage, adverse drug reaction, pain, infection, missed lesion, incomplete examination, and aspiration were discussed.  A timeout was performed by nursing and medical staff to identify the patient (using two patient identifiers) and appropriate procedure. The patient was placed in the left lateral decubitus position. The patient was monitored continuously with EKG tracing, pulse oximetry, blood pressure monitoring, and direct observations.      A rectal examination was performed.  The Olympus colonoscope was inserted into the rectum and advanced under direct vision to the cecum, which was identified by the ileocecal valve and appendiceal orifice.  The quality of the colonic preparation was fair.  A careful inspection was made as the colonscope was withdrawn, including a retroflexed view of the rectum; findings and interventions are described below.  Photo documentation was obtained.  The patient tolerated the procedure fair, and there were no complications.  She was taken to the recovery area in stable condition.      Sedation:  Sedation was administered for single procedure    Versed 4 mg IV, Fentanyl 100 mcg IV and Benadryl 25 mg IV    Findings and Interventions:    Normal appearing mucosa.  Two sessile ascending colon polyps removed with bx forceps and sent for histopathology.  Three sessile transverse colon polyps removed with cold snare and sent for histopathology.  Moderate diverticulosis  throughout the transverse and left colon.  Small internal and external hemorrhoids.    Impression:    Benign appearing colon polyps, removed as above.  Colonic diverticulosis as described above  Internal hemorrhoids as described above.  External hemorrhoids as described above.    Recommendation/Plan:    Repeat colonoscopy in 3 years.  Follow up with primary care physician, as needed.  High fiber diet and daily metamucil  GI clinic follow up    ARoss Marcus MD  Attending Physician

## 2017-12-23 ENCOUNTER — Ambulatory Visit (INDEPENDENT_AMBULATORY_CARE_PROVIDER_SITE_OTHER): Payer: Medicare Other

## 2017-12-23 ENCOUNTER — Ambulatory Visit: Payer: Medicare Other

## 2017-12-23 ENCOUNTER — Encounter: Payer: Self-pay | Admitting: Family Medicine

## 2017-12-23 ENCOUNTER — Ambulatory Visit: Payer: Medicare Other | Attending: Family Medicine | Admitting: Family Medicine

## 2017-12-23 ENCOUNTER — Ambulatory Visit (INDEPENDENT_AMBULATORY_CARE_PROVIDER_SITE_OTHER): Payer: Medicare Other | Admitting: Clinical

## 2017-12-23 VITALS — BP 135/86 | HR 73 | Temp 97.9°F | Resp 16 | Ht 62.76 in | Wt 173.5 lb

## 2017-12-23 DIAGNOSIS — E119 Type 2 diabetes mellitus without complications: Secondary | ICD-10-CM

## 2017-12-23 DIAGNOSIS — R718 Other abnormality of red blood cells: Secondary | ICD-10-CM | POA: Insufficient documentation

## 2017-12-23 DIAGNOSIS — E279 Disorder of adrenal gland, unspecified: Principal | ICD-10-CM

## 2017-12-23 DIAGNOSIS — Z794 Long term (current) use of insulin: Secondary | ICD-10-CM

## 2017-12-23 DIAGNOSIS — F331 Major depressive disorder, recurrent, moderate: Secondary | ICD-10-CM

## 2017-12-23 DIAGNOSIS — F3289 Other specified depressive episodes: Secondary | ICD-10-CM

## 2017-12-23 DIAGNOSIS — H9193 Unspecified hearing loss, bilateral: Secondary | ICD-10-CM | POA: Insufficient documentation

## 2017-12-23 DIAGNOSIS — E278 Other specified disorders of adrenal gland: Secondary | ICD-10-CM

## 2017-12-23 DIAGNOSIS — F419 Anxiety disorder, unspecified: Secondary | ICD-10-CM

## 2017-12-23 DIAGNOSIS — E1121 Type 2 diabetes mellitus with diabetic nephropathy: Secondary | ICD-10-CM | POA: Insufficient documentation

## 2017-12-23 DIAGNOSIS — F418 Other specified anxiety disorders: Secondary | ICD-10-CM

## 2017-12-23 LAB — IRON TOTAL: Iron Total: 30 ug/dL — ABNORMAL LOW (ref 42–135)

## 2017-12-23 LAB — LIPID PANEL WITH DLDL REFLEX
Cholesterol: 161 mg/dL (ref 0–200)
HDL Cholesterol: 61 mg/dL (ref >=35)
LDL Cholesterol Calculation: 71 mg/dL (ref <130)
Non-HDL Cholesterol: 100 mg/dL (ref <=150)
Total Cholesterol: HDL Ratio: 2.6 (ref <4.0)
Triglyceride: 145 mg/dL (ref 35–160)

## 2017-12-23 LAB — CBC WITH DIFFERENTIAL
Basophils % Auto: 0.6 %
Basophils Abs Auto: 0 10*3/uL (ref 0.0–0.2)
Eosinophils % Auto: 2.6 %
Eosinophils Abs Auto: 0.2 10*3/uL (ref 0.0–0.5)
Hematocrit: 39.8 % (ref 36.0–46.0)
Hemoglobin: 12.6 g/dL (ref 12.0–16.0)
Lymphocytes % Auto: 26.9 %
Lymphocytes Abs Auto: 1.9 10*3/uL (ref 1.0–4.8)
MCH: 24.1 pg — ABNORMAL LOW (ref 27.0–33.0)
MCHC: 31.7 % — ABNORMAL LOW (ref 32.0–36.0)
MCV: 76.3 fL — ABNORMAL LOW (ref 80.0–100.0)
MPV: 8.3 fL (ref 6.8–10.0)
Monocytes % Auto: 7.5 %
Monocytes Abs Auto: 0.5 10*3/uL (ref 0.1–0.8)
Neutrophils % Auto: 62.4 %
Neutrophils Abs Auto: 4.3 10*3/uL (ref 1.8–7.7)
Platelet Count: 235 10*3/uL (ref 130–400)
RDW: 17.8 % — ABNORMAL HIGH (ref 0.0–14.7)
Red Blood Cell Count: 5.22 10*6/uL — ABNORMAL HIGH (ref 4.00–5.20)
White Blood Cell Count: 6.9 10*3/uL (ref 4.5–11.0)

## 2017-12-23 LAB — RETICULOCYTE STUDIES
Corrected Retic Count: 1 % (ref 0.4–1.5)
Hematocrit: 39.8 % (ref 36.0–46.0)
Red Blood Cell Count: 5.22 10*6/uL — ABNORMAL HIGH (ref 4.00–5.20)
Reticulocyte Count %: 1.1 % (ref 0.4–2.4)
Reticulocyte Count Abs: 57.4 10*3/uL (ref 18.5–82.5)

## 2017-12-23 LAB — FERRITIN: Ferritin: 11 ng/mL (ref 10–291)

## 2017-12-23 LAB — FOLATE: Folate: 7.8 ng/mL (ref >=7.0)

## 2017-12-23 LAB — TRANSFERRIN
Iron Percent Saturation: 7.7 % — ABNORMAL LOW (ref 15.0–50.0)
Iron Total: 30 ug/dL — ABNORMAL LOW (ref 42–135)
Total Iron Binding Capacity: 391 µg/mL (ref 280–400)
Transferrin: 281 mg/dL (ref 192–382)

## 2017-12-23 LAB — VITAMIN B12: Vitamin B12: 359 pg/mL (ref 213–816)

## 2017-12-23 NOTE — Progress Notes (Signed)
[facility]        PATIENT INFORMATION      Patient:  Paula Daugherty  Medical Record Number:  [id number]   Date of Birth:  1946/01/21   Date of Visit:  12/23/2017     Type of Visit:  Psychotherapy session   Time:  60 minutes    CPT Code:  75170      PSYCHOTHERAPY NOTE ([place])        SUBJECTIVE        Identifying Information    Paula Daugherty is a 72yrold female referred by JJamelle Haring MD for counseling related to anxiety and depression.      Patient Report    Patient reports that symptoms of anxiety and depression symptoms are lessening each week.    Patient states that progress towards goals is moving forward.    Patient reports that homework assigned was completed.    Patient reports taking medications as prescribed.    Patient states that sleep/activity level/appetite was about the same, though she still notices herself overeating.    Patient reports no SI/HI/loss of hope.      OBJECTIVE        Mental Status Exam    General Observations         Appearance  _0  Well groomed  _1  Unkempt  _2  Discheveled      Build  _3  Average  _4  Thin  _5  Overweight      Demeanor  _6  Average  _7  Hostile  _8  Mistrustful  _9  Withdrawn  _10  Preoccupied  _11  Demanding   Eye contact  _12  Average  _13  Avoidant  _14  Intense      Motor activity  _15  Average  _16  Agitated  _17  Slowed      Speech  _18  Clear  _19  Slurred  _20  Rapid  _21  Pressured     Thought Content         Delusions   _22  None reported  _23  Grandiose  _24  Persecutory  _25  Somatic  _26  Bizarre  _27  Nihilistic  _28  Religious   Other   _29  None reported  _30  Preoccupied  _31  Obsessional  _32  Phobic  _33  Guilty   _34  Guarded  _35  Ideas of reference   Self-abuse   _36  None reported  _37  Passive suicidal ideation   _38  Active suicidal ideation  _39  Intent  _40  Plan  _41  Means  _42  Self-mutilation   Harm to others   _43  None reported  _44  Aggression towards others (thoughts only)  _45  Intent to harm others  _46  Plan to hurt others      Perception         Hallucinations   _47  None reported  _48  Auditory  _49  Visual  _50  Olfactory  _51  Gustatory  _52  Tactile    Other   _53  None reported  _54  Illusions  _55  Depersonalization  _56  Derealization      Thought Process           _57  Logical  _58  Circumstantial  _59  Tangential  _60  Loose  _61  Racing  _62  Incoherent     _63  Concrete  _64  Blocked  _65  Flight of ideas      Mood           _66  Euthymic  _67  Depressed  _68  Anxious  _69  Angry  _70  Euphoric  _71  Irritable   Affect           [  x] Full  _0  Constricted  _1  Flat  _2  Inappropriate  _3  Labile    Behavior           _4  Cooperative  _5  Resistant  _6  Agitated  _7  Impulsive  _8  Over sedated  _9  Assaultive     _10  Aggressive  _11  Hyperactive  _12  Restless  _13  Loss of interest  _14  Anhedonia  _15  Akasthisia     _16  Withdrawn  _17  Dystonia  _18  Tardive Dyskinesia      Cognition         Impairment of:   _19  None reported  _20  Orientation  _21  Memory  _22  Attention/ concentration  _23  Ability to abstract     Insight/Judgment           _24  Good  _25  Fair  _26  Poor            Psychotherapy content    Today's session included discussion of how patient has been doing over the last month.  She states overall things are feeling better, as her and her husband are feeling more connected within their community, and are feeling more comfortable within their home.         Medications    Outpatient Medications Prior to Visit   Medication Sig Dispense Refill    Amlodipine (NORVASC) 10 mg Tablet Take 1 tablet by mouth every day. 90 tablet 3    Atorvastatin (LIPITOR) 10 mg Tablet Take 1 tablet by mouth every day at bedtime. 90 tablet 1    Blood Glucose Meter (ONETOUCH ULTRA2) Kit 1 kit one time.  1 kit 0    Blood Sugar Diagnostic (ONETOUCH ULTRA TEST) Strips Use to test blood glucose 3x/day 300 strip 11    Clindamycin (CLEOCIN-T) 1 % Gel Apply to sores in the scalp twice daily as needed with flaring. 30 g 2    empagliflozin (JARDIANCE) 25 mg Tablet Take 1 tablet by mouth every day. 90 tablet 3    FLUTICASONE/SALMETEROL (ADVAIR DISKUS INHA) Take  by inhalation.      GlipiZIDE (GLUCOTROL XL) 10 mg SR Tablet Take 2 tablets by mouth every morning with a meal. (diabetes) 180 tablet 3    Insulin Glargine (LANTUS SOLOSTAR U-100 INSULIN) 100 unit/mL (3 mL) Pen Inject 22 units every night before bed. Please supply 90 day supply. 30 mL 2    linaclotide (LINZESS) 145 mcg Capsule Take 1 capsule by mouth every day. 30 capsule 0    Losartan (COZAAR) 25 mg Tablet Take 1 tablet by mouth every day. 90 tablet 3    Magnesium Citrate Liquid Drink contents of bottle followed by at least 2 cups of water. Do this around 12 pm 2 days prior to colonoscopy. 1 bottle 0    Metformin (GLUCOPHAGE) 1,000 mg tablet Take 1 tablet by mouth 2 times daily with meals. (diabetes) 180 tablet 3    Omeprazole (PRILOSEC) 20 mg Delayed Release Capsule TAKE 1 CAPSULE BY MOUTH TWICE DAILY BEFORE MEALS 180 capsule 1    Ondansetron (ZOFRAN) 8 mg Tablet Take 1 tablet by mouth every 8 hours if needed. 270 tablet 3    Venlafaxine ([name]) 75 mg Tablet TAKE 2 TABLETS BY MOUTH EVERY MORNING AND TAKE 1 TABLET BY MOUTH EVERY EVENING (Patient taking differently: Take 150 mg by mouth every day. TAKE 2 TABLETS BY MOUTH EVERY MORNING AND TAKE 1 TABLET BY MOUTH EVERY EVENING          ) 270 tablet 1  No facility-administered medications prior to visit.        Changes in medications:  None noted        Therapeutic modalities used     Clinician provided active listening and motivational interviewing techniques during this session.        ASSESSMENT        Session summary     Clinician feels patient is progressing towards goals as evidenced by her engaging more socially, along with setting boundaries where discussed.    Clinician feels patient's safety is not at risk at this time due to a safe home environment and a supportive husband.  A higher level of care is not indicated at this time.    No new or revised diagnoses are needed at this time.    Medication questions:  None at this time.      Assessment questionnaires    12/23/2017 PHQ-9 score:   8  12/23/2017 GAD-7 score:   7        PLAN       Will review patient case, medication questions during Co[facility] IDT.    Therapeutic focus will include:  Continued boundary setting, behavioral activation.      EDUCATION      Provided patient psychoeducation regarding continued behavioral activation to help decrease depression.        Kathrene Bongo, [facility]  [facility]

## 2017-12-23 NOTE — Patient Instructions (Addendum)
You have been referred to a specialist. We are pleased to offer the services of [facility] specialists located at the [facility] or [facility] sites. Please allow 7 business days (3 if urgent) for the referral to be processed. After that time you may call: Ears, Nose, Throat: [facility] #[id number],, [phone]  Endocrinology: [facility] #[id number], [phone]       A laboratory test has been ordered for you.   The lab is open 8-5 and operates on a first come/first serve basis. No appointment is necessary. Monday and Friday mornings are typically very busy.  Do not eat or drink anything for 12 hours prior to your test (water is ok). You can take your medications unless told otherwise.         Please see about possibly getting Shinrix from outside pharmacy--repeat due after 2 mos with 1 booster to complete series of 2.     - - - - - - - - - - Information on GYNECOLOGIC CANCERS - - - - - - - - - -    What Women Need to Know    What are Women's Reproductive Cancers?    These are cancers of a woman's reproductive sex organs.  They are also called gynecologic cancers.  They include cancer of the:  -Vulva (lips around the opening of the vagina)  -Vagina (the birth canal)  -Cervix (The opening to the womb)  -Uterus (the womb)  -Fallopian Tubes (carry eggs to the womb)  -Ovaries (hold the eggs)    Who is at risk?  -All women have some risk.  Read this handout and learn more about:  your risk signs of cancer and ways of finding the cancer early    [place]    More women die of this cancer than from any other reproductive cancer.    You have MORE risk for cancer of the ovaries if:  -You are over 98 years of age  -84 in your family has had cancer of the ovaries or breast  -You have had breast cancer  -You have not had any children  -You have used hormones (for menopause) for over 10 years    You have LESS risk for cancer of the ovaries if:  -You have use the  pill (birth control pills)  for more than 5 years  -You have breast fed your babies    WHAT YOU SHOULD LOOK FOR:  Many women have no symptoms.  Sometimes it is hard to tell that symptoms are related to the ovaries.  Some women have:  -Pain, a full feeling, or a lump in the belly that doesn't go away  -Bleeding from the vagina that is not normal  -Stomach problems that do not go away, like gas, nausea or discomfort in the lower belly  - Or pain when having sex    CANCER OF THE UTERUS    Cancer of the uterus is the most common women's reproductive cancer.  This cancer is also called endometrial cancer and it usually starts in the lining of the womb.    You have MORE risk for uterine cancer if:  -You are over 18 years of age  -You have too much body fat, have diabetes or high blood pressure  -You are taking some forms of hormones for menopause  -You have not had children  -Your menopause started after age 63  -You take tamoxifen, or  certain other treatments for breast cancer    WHAT YOU SHOULD LOOK FOR:  Some women have:  -Bleeding or discharge from the vaginal that is not normal  -A full feeling or cramps in the belly that does not go away  -Unexplained weight gain or weight loss    CANCER OF THE CERVIX    Cancer of the cervix can be prevented with regular PAP tests.  The PAP test can find cells that are not normal before they become cancer.    You have MORE risk for cervical cancer if:  -You do not get regular PAP smears and pelvic exams  -You or your sex partner have had many sex partners  -You had sex at an early age  -You have had genital warts (HPV infection)  -You smoke cigarettes    WHAT YOU SHOULD LOOK FOR:  -Bleeding, spotting or discharge from the vagina that is not normal  -Bleeding after having sex  -Pain when having sex    LESS COMMON CANCERS    Cancers of the vagina, vulva and fallopian tubes are not at all common.    You have a RISK for these cancers if:  -You are over 14 years of age  -You have had  cancer of the cervix, or other reproductive cancers  -You have had genital warts (also called HPV)    THE BEST WAY TO PROTECT YOURSELF IS TO FIND A CANCER EARLY!    What you can do to protect yourself:  -Get yearly pelvic exams  -Get PAP tests at appropriate intervals  -Talk to your health care provider about your personal risks  -Report any warning signs or changes you have noticed    Cancer of the ovaries is hard to find.  Talk to your provider about all of your symptoms, even if they do not seem to be related to your ovaries.  Your provider may offer special testing if you are determined to be at risk for ovarian cancer.    WHAT ELSE CAN YOU DO TO STAY HEALTHY:  -Eat 5 servings of fruits and vegetables every day  -Exercise 30 minutes every day  -Stay at a healthy weight  -Practice safer sex:  use a condom.

## 2017-12-23 NOTE — Progress Notes (Signed)
Paula Daugherty is a 37yrfemale who presents with a chief complaint of "loving our spa".    Chief Complaint   Patient presents with    General Exam     CME       1. DM2: doing well with A1c at goal; would like to establish care with new [facility] provider [facility]   2. Decrease in hearing: affecting how she watches TV: patient wishes to see ENT to recheck   3. Microcytosis: MCV in the 70s; planned EGD soon; patient to check labs       ROS:   .Constitutional: negative for fatigue, night sweats, malaise, anorexia, fever; intentional weight loss with recent gain.   Eyes: negative for blurry vision, double vision, visual loss, red eyes, photophobia, eye pain; seeing eye provider regularly   Ears, Nose, Mouth, Throat: negative for difficulty swallowing, persistent sore throat, hoarseness, dentures, post nasal drip, nosebleeds, rhinorrhea, loss of sense of smell, hearing loss, ear pain, tinnitus, discharge from ears, dental problem, change in taste, odynophagia.   CV: no new symptoms; negative for current palpitations, syncope, chest pain, paroxysmal nocturnal dyspnea, orthopnea, lower extremity edema, cyanosis, claudication.   Resp: negative for cough, sputum, hemoptysis, shortness of breath, excessive snoring, apnea spells while sleeping, pleuritic pain, wheezing, dyspnea on exertion.   GI: baseline symptoms associated with nausea; negative for dysphagia, heartburn or reflux, hematemesis, excessive gas or bloating, change in caliber of stool, hemorrhoids, bleeding from rectum, melena, hematochezia, constipation, diarrhea, jaundice.   GU: negative for nocturia, dysuria, decreased force of stream, frequency, hesitancy, hematuria, retention, incontinence.   Musculoskeletal: occasional achy but spa helping with this; negative for AM joint stiffness, joint swelling, joint pain, joint redness, back pain, muscle weakness, muscle pain, muscle cramping.   Integumentary: negative for moles that have  changed, dark lesions, rash, itching, bruising, lumps or bumps, hair loss.   Neuro: negative for confusion, headaches, feel faint, seizures, vertigo, memory loss, paralysis/weakness, numbness or tingling, clumsiness, tremor, speech impairment.   Psych: due to see Lauren today; daughter still not in speaking terms with her; mother out of state continues to be a source of stress; no SI or HI; supportive spouse   Endo: negative for cold intolerance, heat intolerance, polyphagia, polydipsia, polyuria, hair loss, excessive hair growth.   Heme/Lymphatic: negative for recent anemia, bleeding disorder, abnormal bleeding, abnormal bruising, swollen nodes.   Allergy/Immun: negative for hay fever, itchy eyes, itchy nose, clear nasal secretions, rash.   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 90 tablet 3    Atorvastatin (LIPITOR) 10 mg Tablet Take 1 tablet by mouth every day at bedtime. 90 tablet 1    Blood Glucose Meter (ONETOUCH ULTRA2) Kit 1 kit one time. 1 kit 0    Blood Sugar Diagnostic (ONETOUCH ULTRA TEST) Strips Use to test blood glucose 3x/day 300 strip 11    Clindamycin (CLEOCIN-T) 1 % Gel Apply to sores in the scalp twice daily as needed with  flaring. 30 g 2    empagliflozin (JARDIANCE) 25 mg Tablet Take 1 tablet by mouth every day. 90 tablet 3    FLUTICASONE/SALMETEROL (ADVAIR DISKUS INHA) Take  by inhalation.      GlipiZIDE (GLUCOTROL XL) 10 mg SR Tablet Take 2 tablets by mouth every morning with a meal. (diabetes) 180 tablet 3    Insulin Glargine (LANTUS SOLOSTAR U-100 INSULIN) 100 unit/mL (3 mL) Pen Inject 22 units every night before bed. Please supply 90 day supply. 30 mL 2    linaclotide (LINZESS) 145 mcg Capsule Take 1 capsule by mouth every day. 30 capsule 0    Losartan (COZAAR) 25 mg Tablet Take 1 tablet by mouth every day. 90 tablet 3    Magnesium Citrate Liquid Drink contents of bottle followed by at least 2 cups of water. Do this around 12 pm 2 days prior to colonoscopy. 1 bottle 0    Metformin (GLUCOPHAGE) 1,000 mg tablet Take 1 tablet by mouth 2 times daily with meals. (diabetes) 180 tablet 3    Omeprazole (PRILOSEC) 20 mg Delayed Release Capsule TAKE 1 CAPSULE BY MOUTH TWICE DAILY BEFORE MEALS 180 capsule 1    Ondansetron (ZOFRAN) 8 mg Tablet Take 1 tablet by mouth every 8 hours if needed. 270 tablet 3    Venlafaxine ([name]) 75 mg Tablet TAKE 2 TABLETS BY MOUTH EVERY MORNING AND TAKE 1 TABLET BY MOUTH EVERY EVENING (Patient taking differently: Take 150 mg by mouth every day. TAKE 2 TABLETS BY MOUTH EVERY MORNING AND TAKE 1 TABLET BY MOUTH EVERY EVENING          ) 270 tablet 1     No current facility-administered medications on file prior to visit.      Allergies:   Allergies   Allergen Reactions    Contrast Dye [Radiopaque Agent] Hives    Hydrochlorothiazide Other-Reaction in Comments     Patient reports    Januvia [Sitagliptin] Other-Reaction in Comments     Patient reports    Lyrica [Pregabalin] Other-Reaction in Comments     Patient reports    Omnipaque [Iohexol] Other-Reaction in Comments     Patient reports    Prozac [Fluoxetine Hcl] Other-Reaction in Comments     Patient reports    Sulfa (Sulfonamide Antibiotics)  Rash    Topiramate Other-Reaction in Comments     Hairfall       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 [employer]    Social Needs    Financial resource strain: Not on file    Food insecurity:     Worry: Not on file     Inability: Not on file    Transportation needs:  Medical: Not on file     Non-medical: Not on file   Tobacco Use    Smoking status: Former Smoker     Packs/day: 0.10     Years: 20.00     Pack years: 2.00    Smokeless tobacco: Never Used    Tobacco comment: quit 30 years ago   Substance and Sexual Activity    Alcohol use: Yes     Alcohol/week: 0.0 standard drinks     Comment: rare shares a beer or glass wine every 2-3 months     Drug use: No    Sexual activity: Yes     Partners: Male   Lifestyle    Physical activity:     Days per week: Not on file     Minutes per session: Not on file    Stress: Not on file   Relationships    Social connections:     Talks on phone: Not on file     Gets together: Not on file     Attends religious service: Not on file     Active member of club or organization: Not on file     Attends meetings of clubs or organizations: Not on file     Relationship status: Not on file    Intimate partner violence:     Fear of current or ex partner: Not on file     Emotionally abused: Not on file     Physically abused: Not on file     Forced sexual activity: Not on file   Other Topics Concern    Not on file   Social History Narrative    Not on file     Family History   Problem Relation Name Age of Onset    Diabetes Father      Heart Mother          MI at 60s     Non-contributory Brother      Non-contributory Brother         PE:  BP (!) 150/92 (SITE: right arm, Orthostatic Position: sitting, Cuff Size: regular)   Pulse 73   Temp 36.6 C (97.9  F) (Temporal)   Resp 16   Ht 1.594 m (5' 2.76")   Wt 78.7 kg (173 lb 8 oz)   LMP 05/06/1983   BMI 30.97 kg/m   General Appearance: healthy, alert, no distress, pleasant affect, cooperative.  Eyes:  conjunctivae and corneas clear. PERRL, EOM's intact. sclerae normal.  Ears:  normal TM on left; right side not seen due to impacted cerumen--patient to use over the counter drops; canal and external inspection of ears show no abnormality.  Nose:  nasal mucosa normal.  Mouth: good dentition; no lesions.  Neck:  Neck supple. No adenopathy, thyroid symmetric, normal size.  Heart:  normal rate and regular rhythm, no murmurs, clicks, or gallops.  Lungs: clear to auscultation.  Abdomen: BS normal.  Abdomen soft, non-tender.  No masses or organomegaly.  Extremities:  no cyanosis, clubbing, or edema.  Skin:  Skin color, texture, turgor normal. No rashes or lesions.  Neuro: grossly intact.  Musculoskeletal: normal tone  Breast:  normal in size and symmetry, normal contour with no evidence of flattening or dimpling, skin normal, nipples everted without rashes or discharge, palpation negative for masses or nodules, no LAD: recent mammo reviewed with patient.  Pelvic:  Patient declined.  Rectal: patient declined.    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 No guarantees were made regarding her medical care or treatment outcome. Barriers to Learning: none.  Patient verbalizes understanding of teaching and instructions.    Electronically signed by:    [name], MD  [place], [place] Board of Family Medicine  Associate physician [facility], [place]   [phone]

## 2017-12-23 NOTE — Nursing Note (Signed)
Patient roomed, chief complaint noted, allergies verified, blood pressure, pulse, respiration, and weight obtained, screened for pain, and pharmacy verified.  Penelopi Mikrut, M.A.

## 2017-12-24 ENCOUNTER — Encounter: Payer: Self-pay | Admitting: GASTROENTEROLOGY

## 2017-12-24 LAB — CREATININE 24 HOUR URINE
COLLECTION INTERVAL, URINE: 24 h
Creatinine 24 Hr: 1058 mg/24Hr (ref 800–2000)
Creatinine Conc: 60.43 mg/dL
Urine Volume: 1750 mL

## 2017-12-24 NOTE — Telephone Encounter (Signed)
From: [name]  To: [name], MD  Sent: 12/23/2017 6:01 PM PDT  Subject: Test Result Question    Paula Daugherty, Dr [name] in [place] had me get some bloodwork done this morning as part of my annual physical. Results are back and there is some issues with red blood cells. She wants me to take iron bid and, "follow up with GI". When you have time would you please take a look and let me know what you would like me to do. I am concerned about taking the iron as I know it adds to my constipation. I already have issues and getting care from you and Jeral Pinch. Anyway your thoughts are always appreciated. Thank you, Paula Daugherty

## 2017-12-25 ENCOUNTER — Other Ambulatory Visit: Payer: Self-pay | Admitting: GASTROENTEROLOGY

## 2017-12-25 ENCOUNTER — Encounter: Payer: Self-pay | Admitting: Family Medicine

## 2017-12-25 DIAGNOSIS — D509 Iron deficiency anemia, unspecified: Secondary | ICD-10-CM

## 2017-12-25 NOTE — Telephone Encounter (Signed)
From: [name]  To: [name], MD  Sent: 12/25/2017 8:44 AM PDT  Subject: Test Result Question    Hello, I wanted to share that I texted with Dr.Chak my GI guy. I asked him to review test results and advise as to what he thinks should be done. He says test show I have IDA.He said I could try the oral if I want.He also offered IV iron. He wants to find the cause of IDA. He says there is a type of Endoscope that can be done to look for issues. I am already having one done in Dec for the cancer so I am going to ask if he can do both at same time. I am hoping you can see what he wrote me. I would like [place] all on the same page for my treatment plan. I am going to start the iron pills today but having taken them before I know the stomach/bowel issues it has caused in the past. I just wanted to keep you informed. Please let me know if you have any thoughts on all of this. Thanks, [name]

## 2017-12-28 LAB — CORTISOL,URINE FREE SENDOUT
CORTISOL,UR FREE PER 24HR: 34.1 ug/d (ref <=45.0)
CORTISOL,UR FREE PER VOLUME: 19.5 ug/L
CORTISOL,UR FREE RATIO TO CRT: 31.97 ug/g CRT
CREATININE,UR PER 24HR: 1068 mg/d (ref 500–1400)
CREATININE,UR PER VOLUME: 61 mg/dL
TIME OF COLLECTION: 24 hr
TOTAL VOLUME: 1750 mL

## 2017-12-28 LAB — METANEPHRINES FRACTIONATED,UR SENDOUT
CREATININE,UR PER 24HR: 1050 mg/d (ref 500–1400)
CREATININE,UR PER VOLUME: 60 mg/dL
METANEPHRINE,UR-PER 24HR: 114 ug/d (ref 39–143)
METANEPHRINE,UR-PER VOLUME: 65 ug/L
METANEPHRINE,UR-RATIO TO CRT: 108 ug/g CRT (ref 0–300)
NORMETANEPHRIN,UR-RATIO TO CRT: 442 ug/g CRT — ABNORMAL HIGH (ref 0–400)
NORMETANEPHRINE,UR-PER 24HR: 464 ug/d — ABNORMAL HIGH (ref 109–393)
NORMETANEPHRINE,UR-PER VOLUME: 265 ug/L
TIME OF COLLECTION: 24 hr
TOTAL VOLUME: 1750 mL

## 2017-12-29 LAB — CATECHOLAMINES,URINE FREE SENDOUT
CREATININE,UR PER 24HR: 1068 mg/d (ref 500–1400)
CREATININE,UR PER VOLUME: 61 mg/dL
DOPAMINE,UR PER 24HR: 194 ug/d (ref 56–272)
DOPAMINE,UR PER VOLUME: 111 ug/L
DOPAMINE,UR RATIO TO CRT: 182 ug/g CRT (ref 0–250)
EPINEPHRINE,UR PER 24 HR: 2 ug/d (ref 1–14)
EPINEPHRINE,UR PER VOLUME: 1 ug/L
EPINEPHRINE,UR RATIO TO CRT: 2 ug/g CRT (ref 0–20)
NOREPINEPHRINE,UR PER 24HR: 58 ug/d (ref 11–60)
NOREPINEPHRINE,UR PER VOLUME: 33 ug/L
NOREPINEPHRINE,UR RATIO TO CRT: 54 ug/g CRT — ABNORMAL HIGH (ref 0–45)
TIME OF COLLECTION: 24 hr
TOTAL VOLUME: 1750 mL

## 2017-12-30 ENCOUNTER — Ambulatory Visit
Admission: RE | Admit: 2017-12-30 | Discharge: 2017-12-30 | Disposition: A | Discharge status: Home / Self Care | Payer: Medicare Other | Source: Ambulatory Visit | Attending: Adult Health | Admitting: Adult Health

## 2017-12-30 DIAGNOSIS — R198 Other specified symptoms and signs involving the digestive system and abdomen: Secondary | ICD-10-CM | POA: Insufficient documentation

## 2017-12-30 DIAGNOSIS — K5909 Other constipation: Secondary | ICD-10-CM | POA: Insufficient documentation

## 2017-12-30 HISTORY — PX: BIOFEEDBACK PERI/URO/RECTAL: AMBPRO0204

## 2017-12-30 NOTE — Nurse Procedure (Signed)
NMR session #5 completed.  Patient encouraged to continue with daily rectal Kegel exercises as instructed.

## 2017-12-31 ENCOUNTER — Other Ambulatory Visit: Payer: Self-pay | Admitting: Family Medicine

## 2017-12-31 DIAGNOSIS — K5909 Other constipation: Secondary | ICD-10-CM

## 2017-12-31 NOTE — Procedures (Signed)
Pre-Procedure Time Out Checklist  (do not remove)     Attestation:  ID verified by two sources (select any two from list): MRN and Name  Was this an emergency procedure?  no     I attest that I verified the following information prior to performing the procedure: Procedure     Date of Service: 12/31/2017    Patient: Paula Daugherty  LOCATION: [facility]  MR #: [id number]  SEX: female  AGE: 81yr DOB: [id number]   OPERATION RECORD    PROCEDURE: Pelvic Floor neuromuscular retraining session    PRIMARY CARE PROVIDER:   PCP: JJamelle Haring MD    REFERRING PHYSICIAN:   JJamelle Haring MD    ATTENDING PHYSICIAN:   JLarrie Kass MD.  I was present for the interpretation.     PREOPERATIVE DIAGNOSES:   Chronic constipation; Weak anal sphincter squeeze    POSTOPERATIVE DIAGNOSES:  Successful completion of session 5 of neuromuscular retraining session.    CONSENT:  Risks, benefits and alternatives to a pelvic floor retraining session have been discussed with the patient including, but not limited to, the risk of pain, bleeding. A timeout was performed by nursing and medical staff to identify the patient (using two patient identifiers) and appropriate procedure. The patient is agreeable to proceed and did not have any questions.    PROCEDURE:  [facility]. The patient was monitored throughout the entire procedure. An electromyography ( EMG) study of the pelvic musculature was performed with an interval sensor monitoring the levator ani muscles and surface sensors monitoring the abdominals (or other accesory muscles). The sensor is introduced into the rectum. The patient is educated regarding , brething, relaxation techniques as well how to perform proper contractions and relaxation maneuvers. This procedure first evaluate the ability of the phasic ( fast twitch) muscles to make quick contractions on demand. Next the tonic (slow twitch) and phasic EMG levator ani muscle  activity is measured during the relax and contract sequences. The abdominal muscles are monitored to ensure the isolation of the levator ani muscles during this evaluation.  The results of the procedure appears below. The N Score or net score is the mean microvolt value during contraction sequences less the mean microvolt value during the resting sequences.  An increase in the N score will correlate to improvement of levator ani muscle function as well as reduced incidence of incontinence episodes.    Activity         Mean uV values  Kegel Contract        12.7  Kegel relax   4.9  N-score   7.9    Estimated blood loss: none    Complications: none    Specimens: none    The N score is improved from baseline.     RECOMMENDATION:  Continue neuromuscular retraining sessions  Continue Kegel exercises as instructed    DAssunta Gambles NP  Division of Gastroenterology  PI# [id number]

## 2018-01-01 ENCOUNTER — Encounter: Payer: Self-pay | Admitting: "Endocrinology

## 2018-01-06 ENCOUNTER — Encounter: Payer: Self-pay | Admitting: "Endocrinology

## 2018-01-06 ENCOUNTER — Ambulatory Visit: Payer: Self-pay

## 2018-01-08 ENCOUNTER — Encounter: Payer: Self-pay | Admitting: "Endocrinology

## 2018-01-19 ENCOUNTER — Encounter: Payer: Self-pay | Admitting: "Endocrinology

## 2018-01-19 ENCOUNTER — Ambulatory Visit: Payer: Medicare Other | Admitting: "Endocrinology

## 2018-01-19 VITALS — BP 148/89 | HR 77 | Temp 98.6°F | Ht 62.0 in | Wt 171.3 lb

## 2018-01-19 DIAGNOSIS — E279 Disorder of adrenal gland, unspecified: Secondary | ICD-10-CM

## 2018-01-19 DIAGNOSIS — E278 Other specified disorders of adrenal gland: Secondary | ICD-10-CM

## 2018-01-19 DIAGNOSIS — I10 Essential (primary) hypertension: Secondary | ICD-10-CM

## 2018-01-19 DIAGNOSIS — Z794 Long term (current) use of insulin: Secondary | ICD-10-CM

## 2018-01-19 DIAGNOSIS — E1129 Type 2 diabetes mellitus with other diabetic kidney complication: Secondary | ICD-10-CM

## 2018-01-19 MED ORDER — EMPAGLIFLOZIN 25 MG TABLET
25.0000 mg | ORAL_TABLET | Freq: Every day | ORAL | 3 refills | Status: DC
Start: 2018-01-19 — End: 2019-01-25

## 2018-01-19 MED ORDER — INSULIN GLARGINE (U-100) 100 UNIT/ML (3 ML) SUBCUTANEOUS PEN: PEN_INJECTOR | SUBCUTANEOUS | 3 refills | Status: DC

## 2018-01-19 NOTE — Patient Instructions (Addendum)
Increase your dose of Lantus to 26 units each night at bedtime.      Please get labs done in 3 months prior to your follow-up appointment.

## 2018-01-19 NOTE — Progress Notes (Signed)
Chief Complaint   Patient presents with    Diabetes     Type 2 diabetes mellitus      BACKGROUND:  From last Endocrinology Clinic appt with Dr. Rosary Lively in July 2019:  "Paula Daugherty is a 72yrold female who has a diagnosis of type 2 diabetes mellitus who presents to Endocrinology for follow up. Her history is as follows:  She was initially diagnosed with diabetes around 2000 on routine labs.  She was started on Metformin around 2005 and then she was started on insulin in 2014. She was up to 64 units of Lantus at night. However, after making changes to her diet and lifestyle, she was taken off insulin in 2015.Basal insulin was added back to her regimen in 2018.    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    Current Regimen:              Jardiance 25 mg daily             Glipizide 20 mg daily             Lantus 22 units QHS             Metformin 1000 mg BID    She also has a history of an adrenal nodule. Hormonal evaluation was normal in 01/2016 and she will be due for repeat hormonal evaluation in 11/2017.     DIABETES HISTORY  (-) h/o retinopathy.   (-) h/o microalbuminuria/nephropathy.  (-) h/o neuropathy.  (-) h/o Autonomic dysfunction  (-) h/o Gastroparesis  (-) h/o CAD  (-) h/o PVD  Last eye exam:06/2017, f/u 1year  Last urine microalbumin:03/2017,124  Pneumovax: 12/2014, Prevnar 2018  Influenza vaccine:01/2017  (+) ASA  (+) Statin  (+) ACE/ARB      Adrenal nodule  - Biochemical evaluation normal in 11/2016, stability by imaging in 04/2017  - Repeat hormonal evaluation due in7/2019(~1 year from previous) for 5 years total (2022)    HISTORY OF PRESENT ILLNESS:   Paula Ruferis a 89yrear old female who is here for the following reason:  Follow up for T2DM    Pt's Lantus dose was increased from 22 to 24 units qHS at her last appt with Dr. ShRosary Lively   Current medication regimen:   Lantus 24 units QHS  Jardiance 25 mg daily  Glipizide 20 mg daily  Metformin 1000  mg BID    Pt denies any problems with GU infections since starting Jardiance.    Brought glucometer today.  Checks glucose levels 1 times a day, mostly fasting morning levels.   Review of download of glucometer data from the past 30 days showed an avg glucose of 154 +/- 32.     Only had one episode of hypoglycemia in the past month, 62.  Denies frequent hypoglycemia.    Diet:   Eats 2 meals per day, 1 snack a day    Exercise:   No    Last eye exam: Feb 2019  Last foot exam: July 2019  Last A1c:   Lab Results   Component Value Date    HGBA1C 6.8 (H) 10/19/2017     Last microalbumin: Nov 2018  Last lipid panel: Aug 2019  Last vit B12: Aug 2019  Vaccinations: Up to date for PNA and influenza   Pt on ACE-inhibitor/ARB: Losartan  Pt on Statin: Lipitor  Pt on ASA: No  A1c goal: < 7.0%  With regard to h/o adrenal nodule, she had annual labs done in July 2019.  Denies episodic HA, sweating, chest pain/palpitations.      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      REVIEW OF SYSTEMS:   Constitutional: No fevers or unexplained weight changes   Eyes: No vision changes or eye pain   ENT: No throat pain   CV: No chest pain or palpit ations   Resp: No dyspnea or cough   GI: No abdominal pain, n/v/d, hematochezia or melena   GU: No polyuria, dysuria, or hematuria   MSK: No muscle pain or weakness   Integumentary: No rash   Neuro: No HA, weakness, paresthesias     Past Medical History:   Diagnosis Date    Angina pectoris ([place]) 09/23/2017    Anxiety     Asthma     Cirrhosis ([place])     Constipation, chronic     Depression     Diabetes mellitus ([place])     Fatty liver     Hypertension     Kidney disease 2015    cyst left kidney per pt    Liver fibrosis     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/20/2016    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    Psychiatric illness        Past Surgical  History:   Procedure Laterality Date    BIOFEEDBACK PERI/URO/RECTAL  10/21/2017    session #1: N Score = 5.4    BIOFEEDBACK PERI/URO/RECTAL  11/04/2017    session #2    BIOFEEDBACK PERI/URO/RECTAL  11/18/2017    Session #3    BIOFEEDBACK PERI/URO/RECTAL  12/02/2017    Session #4    BIOFEEDBACK PERI/URO/RECTAL  12/30/2017    Session #5    COLONOSCOPY  01/04/14    polyp--TA and erosion; hemorrhoids; tics; repeat x 3 yrs    COLONOSCOPY  12/16/2017    TA; repeat in 3 yrs    EGD      HC COLONOSCOPY,REMV LESN,SNARE  12/16/2017    MAMMOPLASTY, REDUCTION  1985    MANOMETRY  09/02/2017    work on constipation; kegels and biofeedback; proceed w/colonoscopy     PHACOEMULSIFICATION, CATARACT Right 02/12/2015    with IOL    PR ESOPHAGOGASTRODUODENOSCOPY TRANSORAL DIAGNOSTIC  02/12/2016    EGD--polyp--path pending; antral gastritis; no varices     PR ESOPHAGOGASTRODUODENOSCOPY TRANSORAL DIAGNOSTIC  06/11/2016    EGD--biopsy at site of carcinoid tumor path pending     PR ESOPHAGOGASTRODUODENOSCOPY TRANSORAL DIAGNOSTIC      EGD--folds in duodenum--path pending     PR ESOPHAGOGASTRODUODENOSCOPY TRANSORAL DIAGNOSTIC  04/2017    EGD; repeat EGD in 1 year     PR KNEE SCOPE,DIAGNOSTIC  1980s    Knee arthroscopy    PR LIGATE FALLOPIAN TUBE      Tubal ligation    PR REPAIR TYMPANIC MEMBRANE      Tympanoplasty    PR TOTAL ABDOM HYSTERECTOMY  1980s    partial; no BSO    REPAIR, BLEPHAROPTOSIS Bilateral 08/06/2015    Blepharoplasty bilateral upper lids       Patient Active Problem List   Diagnosis    DM2 (diabetes mellitus, type 2) ([place])    Depression with anxiety    Stress at home    Leg cramps-right calf    Right ear  pain    Fibromyalgia    HTN (hypertension)    GERD (gastroesophageal reflux disease)    Hyperlipidemia with target LDL less than 70    Vitamin D insufficiency    Abnormal LFTs    Renal cyst ( 6.4 cm cyst left kidney)    Cough    Fatty liver    Macroalbuminuric diabetic nephropathy (HCC)     History of actinic keratoses    Cataract    Ptosis of eyelid    Liver fibrosis    Adrenal nodule (HCC)    Medication Therapy Auth    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HCC)    Mixed conductive and sensorineural hearing loss of both ears    Neoplasm of uncertain behavior of skin    Abdominal pain, generalized    Nausea without vomiting    Asymptomatic microscopic hematuria    Subcutaneous nodule    Angina pectoris (HCC)       Current Outpatient Medications   Medication Sig Dispense Refill    Amlodipine (NORVASC) 10 mg Tablet Take 1 tablet by mouth every day. 90 tablet 3    Atorvastatin (LIPITOR) 10 mg Tablet Take 1 tablet by mouth every day at bedtime. 90 tablet 1    Blood Glucose Meter (ONETOUCH ULTRA2) Kit 1 kit one time. 1 kit 0    Blood Sugar Diagnostic (ONETOUCH ULTRA TEST) Strips Use to test blood glucose 3x/day 300 strip 11    Clindamycin (CLEOCIN-T) 1 % Gel Apply to sores in the scalp twice daily as needed with flaring. 30 g 2    empagliflozin (JARDIANCE) 25 mg Tablet Take 1 tablet by mouth every day. 90 tablet 3    FLUTICASONE/SALMETEROL (ADVAIR DISKUS INHA) Take  by inhalation.      GlipiZIDE (GLUCOTROL XL) 10 mg SR Tablet Take 2 tablets by mouth every morning with a meal. (diabetes) 180 tablet 3    Insulin Glargine (LANTUS SOLOSTAR U-100 INSULIN) 100 unit/mL (3 mL) Pen Inject 22 units every night before bed. Please supply 90 day supply. 30 mL 2    linaclotide (LINZESS) 145 mcg Capsule Take 1 capsule by mouth every day. 30 capsule 0    Losartan (COZAAR) 25 mg Tablet Take 1 tablet by mouth every day. 90 tablet 3    Magnesium Citrate Liquid Drink contents of bottle followed by at least 2 cups of water. Do this around 12 pm 2 days prior to colonoscopy. 1 bottle 0    Metformin (GLUCOPHAGE) 1,000 mg tablet Take 1 tablet by mouth 2 times daily with meals. (diabetes) 180 tablet 3    Omeprazole (PRILOSEC) 20 mg Delayed Release Capsule TAKE 1 CAPSULE BY MOUTH TWICE DAILY BEFORE  MEALS 180 capsule 1    Ondansetron (ZOFRAN) 8 mg Tablet Take 1 tablet by mouth every 8 hours if needed. 270 tablet 3    Venlafaxine ([name]) 75 mg Tablet TAKE 2 TABLETS BY MOUTH EVERY MORNING AND TAKE 1 TABLET BY MOUTH EVERY EVENING (Patient taking differently: Take 150 mg by mouth every day. TAKE 2 TABLETS BY MOUTH EVERY MORNING AND TAKE 1 TABLET BY MOUTH EVERY EVENING          ) 270 tablet 1     No current facility-administered medications for this visit.        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    Tobacco Use    Smoking status: Former Smoker     Packs/day: 0.10     Years: 20.00     Pack years: 2.00    Smokeless tobacco: Never Used    Tobacco comment: quit 30 years ago   Substance and Sexual Activity    Alcohol use: Yes     Alcohol/week: 0.0 standard drinks     Comment: rare shares a beer or glass wine every 2-3 months     Drug use: No    Sexual activity: Yes     Partners: Male        Family History   Problem Relation Name Age of Onset    Diabetes Father      Heart Mother          MI at 63s     Non-contributory Brother      Non-contributory Brother         BP 148/89 (SITE: right arm, Orthostatic Position: sitting, Cuff Size: regular)   Pulse 77   Temp 37 C (98.6 F) (Temporal)   Ht 1.575 m (_0 )   Wt 77.7 kg (171 lb 4.8 oz)   LMP 05/06/1983   SpO2 96%   BMI 31.33 kg/m     PHYSICAL EXAM:   Vital signs: Reviewed  General appearance: Well-appearing, alert, conversing appropriately   Head: Normocephalic   Eyes: EOM intact, no scleral icterus   Neck: No visible thyromegaly   CV: RRR, no murmurs appreciated   Pulm: CTAB, no w/r/r   Neuro: Normal gait   Psych: Normal affect and speech   Skin: No diffuse rash     LAB, RADIOLOGY, AND OTHER STUDY RESULTS:   I  personally reviewed the following results:     Component      Latest Ref Rng & Units 12/23/2017 12/23/2017 10/19/2017           9:23 AM  9:23 AM  9:38 AM   SODIUM      135 - 145 mmol/L      POTASSIUM      3.3 - 5.0 mmol/L      CHLORIDE      95 - 110 mmol/L      CARBON DIOXIDE TOTAL      24 - 32 mmol/L      UREA NITROGEN, BLOOD (BUN)      8 - 22 mg/dL      CREATININE BLOOD      0.44 - 1.27 mg/dL      GLUCOSE      70 - 99 mg/dL      CALCIUM      8.6 - 10.5 mg/dL      PROTEIN      6.3 - 8.3 g/dL      ALBUMIN      3.2 - 4.6 g/dL      ALKALINE PHOSPHATASE (ALP)      35 - 115 U/L      ASPARTATE TRANSAMINASE (AST)      15 - 43 U/L      BILIRUBIN TOTAL      0.3 - 1.3 mg/dL      ALANINE TRANSFERASE (ALT)      5 - 54 U/L      E-GFR, AFRICAN AMERICAN      >60  See Note mL/min/1.55m2      E-GFR, NON-AFRICAN AMERICAN      >60 See Note mL/min/1.749m      CHOLESTEROL      0 - 200 mg/dL 161     HDL CHOLESTEROL      >=35 mg/dL 61     LDL CHOLESTEROL CALCULATION      <130 mg/dL 71     TOTAL CHOLESTEROL:HDL RATIO      <4.0 2.6     TRIGLYCERIDE      35 - 160 mg/dL 145     NON-HDL CHOLESTEROL      <=150 mg/dL 100     MICROALBUMIN URINE      mg/dL      CREATININE SPOT URINE      mg/dL      MICROALBUMIN/CREATININE RATIO      <30 mg/g      HGB A1C      3.9 - 5.6 %   6.8 (H)   HGB A1C,GLUCOSE EST AVG      mg/dL   148   VITAMIN B12      213 - 816 pg/mL  359      Component      Latest Ref Rng & Units 06/29/2017 06/29/2017 04/09/2017           2:54 PM  2:54 PM  2:16 PM   SODIUM      135 - 145 mmol/L  142 141   POTASSIUM      3.3 - 5.0 mmol/L  3.8 3.2 (L)   CHLORIDE      95 - 110 mmol/L  103 101   CARBON DIOXIDE TOTAL      24 - 32 mmol/L  25 25   UREA NITROGEN, BLOOD (BUN)      8 - 22 mg/dL  17 23 (H)   CREATININE BLOOD      0.44 - 1.27 mg/dL  1.15 1.12   GLUCOSE      70 - 99 mg/dL  145 (H) 100 (H)   CALCIUM      8.6 - 10.5 mg/dL  9.5 9.9   PROTEIN      6.3 - 8.3 g/dL  8.0    ALBUMIN      3.2 - 4.6 g/dL  3.7    ALKALINE PHOSPHATASE (ALP)      35 -  115 U/L  137 (H)    ASPARTATE TRANSAMINASE (AST)      15 - 43 U/L  50 (H)    BILIRUBIN TOTAL      0.3 - 1.3 mg/dL  1.0    ALANINE TRANSFERASE (ALT)      5 - 54 U/L  46    E-GFR, AFRICAN AMERICAN      >60 See Note mL/min/1.738m 56 58   E-GFR, NON-AFRICAN AMERICAN      >60 See Note mL/min/1.55m35m49 50   CHOLESTEROL      0 - 200 mg/dL      HDL CHOLESTEROL      >=35 mg/dL      LDL CHOLESTEROL CALCULATION      <130 mg/dL      TOTAL CHOLESTEROL:HDL RATIO      <4.0      TRIGLYCERIDE      35 - 160 mg/dL      NON-HDL CHOLESTEROL      <=150 mg/dL      MICROALBUMIN URINE      mg/dL  CREATININE SPOT URINE      mg/dL      MICROALBUMIN/CREATININE RATIO      <30 mg/g      HGB A1C      3.9 - 5.6 % 6.6 (H)     HGB A1C,GLUCOSE EST AVG      mg/dL 143     VITAMIN B12      213 - 816 pg/mL        Component      Latest Ref Rng & Units 03/06/2017 03/06/2017          11:07 AM 11:07 AM   SODIUM      135 - 145 mmol/L     POTASSIUM      3.3 - 5.0 mmol/L     CHLORIDE      95 - 110 mmol/L     CARBON DIOXIDE TOTAL      24 - 32 mmol/L     UREA NITROGEN, BLOOD (BUN)      8 - 22 mg/dL     CREATININE BLOOD      0.44 - 1.27 mg/dL     GLUCOSE      70 - 99 mg/dL     CALCIUM      8.6 - 10.5 mg/dL     PROTEIN      6.3 - 8.3 g/dL     ALBUMIN      3.2 - 4.6 g/dL     ALKALINE PHOSPHATASE (ALP)      35 - 115 U/L     ASPARTATE TRANSAMINASE (AST)      15 - 43 U/L     BILIRUBIN TOTAL      0.3 - 1.3 mg/dL     ALANINE TRANSFERASE (ALT)      5 - 54 U/L     E-GFR, AFRICAN AMERICAN      >60 See Note mL/min/1.68m2     E-GFR, NON-AFRICAN AMERICAN      >60 See Note mL/min/1.732m     CHOLESTEROL      0 - 200 mg/dL     HDL CHOLESTEROL      >=35 mg/dL     LDL CHOLESTEROL CALCULATION      <130 mg/dL     TOTAL CHOLESTEROL:HDL RATIO      <4.0     TRIGLYCERIDE      35 - 160 mg/dL     NON-HDL CHOLESTEROL      <=150 mg/dL     MICROALBUMIN URINE      mg/dL 14.0    CREATININE SPOT URINE      mg/dL 113.00 113.00   MICROALBUMIN/CREATININE RATIO      <30 mg/g 124 (H)    HGB  A1C      3.9 - 5.6 %     HGB A1C,GLUCOSE EST AVG      mg/dL     VITAMIN B12      213 - 816 pg/mL       CORTISOL,URINE FREE   Order: [id number] Collected:  12/23/2017 07:45 Status:  Final result Visible to patient:  Yes (MyChart) Dx:  Adrenal nodule (HCC)    Ref Range & Units    TOTAL VOLUME mL 1750    TIME OF COLLECTION hr 24    CORTISOL,UR FREE PER VOLUME ug/L 19.50    CORTISOL,UR FREE PER 24HR <=45.0 ug/d 34.1    CORTISOL,UR FREE RATIO TO CRT ug/g CRT 31.97  CATECHOLAMINES,URINE FREE   Order: [id number]   Collected:  12/23/2017 07:45 Status:  Final result Visible to patient:  Yes (MyChart) Dx:  Adrenal nodule (HCC)    Ref Range & Units    TOTAL VOLUME mL 1750    TIME OF COLLECTION hr 24    DOPAMINE,UR PER VOLUME ug/L 111    DOPAMINE,UR PER 24HR 56 - 272 ug/d 194    Comment: REFERENCE INTERVAL: Dopamine, Urine - ug/d     Access complete set of age- and/or gender-specific reference   intervals for this test in the [facility] Test Directory   ([employer]pt).   DOPAMINE,UR RATIO TO CRT 0 - 250 ug/g CRT 182    NOREPINEPHRINE,UR PER VOLUME ug/L 33    NOREPINEPHRINE,UR PER 24HR 11 - 60 ug/d 58    Comment: REFERENCE INTERVAL: Norepinephrine, Urine - ug/d     Access complete set of age- and/or gender-specific reference   intervals for this test in the [facility] Test Directory   ([employer]).   NOREPINEPHRINE,UR RATIO TO CRT 0 - 45 ug/g CRT 54High     EPINEPHRINE,UR PER VOLUME ug/L 1    EPINEPHRINE,UR PER 24 HR 1 - 14 ug/d 2    Comment: REFERENCE INTERVAL: Epinephrine, Urine - ug/d     Access complete set of age- and/or gender-specific reference   intervals for this test in the [facility] Test Directory   ([employer]pt).   EPINEPHRINE,UR RATIO TO CRT 0 - 20 ug/g CRT 2            METANEPHRINES FRACTIONATED,UR   Order: [id number]   Collected:  12/23/2017 07:45 Status:  Final result Visible to patient:  Yes (MyChart) Dx:  Adrenal nodule (HCC)    Ref Range & Units    TOTAL VOLUME mL 1750    TIME OF  COLLECTION hr 24    METANEPHRINE,UR-PER VOLUME ug/L 65    METANEPHRINE,UR-PER 24HR 39 - 143 ug/d 114    METANEPHRINE,UR-RATIO TO CRT 0 - 300 ug/g CRT 108    NORMETANEPHRINE,UR-PER VOLUME ug/L 265    NORMETANEPHRINE,UR-PER 24HR 109 - 393 ug/d 464High     NORMETANEPHRIN,UR-RATIO TO CRT 0 - 400 ug/g CRT 442High             CT ABDOMEN + PELVIS UROGRAM  EXAM DATE: 04/08/2017 5:21 PM  COMPARISON: 12/03/2016  IMPRESSION:  1. No cause for hematuria. No urolithiasis, renal mass, or  uroepithelial mass in the kidneys, ureters, or urinary bladder.  2. No hydronephrosis or hydroureter.  3. Unchanged 1.8 cm left adrenal adenoma.    ASSESSMENT AND PLAN:     (E11.29,  Z79.4) Type 2 diabetes mellitus with other diabetic kidney complication, with long-term current use of insulin ([place])   I reviewed most recent lab results and glucometer data with patient. A1c at goal.  Due to elevated fasting glucose levels, will increase Lantus to 26 units qHS.  Continue glipizide and metformin.  GFR remains > 45.  Counseled pt on possible adverse effects of SGLT2i's including genitourinary infections (including Fournier' gangrene), DKA, bone fractures, orthostasis, hyperkalemia, renal failure, bladder cancer [place]), and increased risk for amputations ([place]). Also counseled on CV benefit of Jardiance. Pt voiced understanding and wishes to continue Jardiance. Counseled not to skip/delay meals and to check glucose level 1 to 2 times a day. Provided with verbal and written instructions on management of hypoglycemia. LDL at goal of less than 100.      Last eye exam: Feb 2019  Last foot exam:  July 2019  Last A1c:   Lab Results   Component Value Date    HGBA1C 6.8 (H) 10/19/2017     Last microalbumin: Nov 2018  Last lipid panel: Aug 2019  Last vit B12: Aug 2019  Vaccinations: Up to date for PNA and influenza   Pt on ACE-inhibitor/ARB: Losartan  Pt on Statin: Lipitor  Pt on ASA: No  A1c goal: < 7.0%    (I10) Essential hypertension  BP not at goal of  less than 130/80.  Instructed to monitor at home.      (E27.9) Adrenal nodule ([place])  According to prior notes, biochemical evaluation was normal in 01/2016 and 11/2016.  Found to have stability by imaging in 04/2017.  Repeat hormonal evaluation due in7/2019 was normal (none of 24 hr urine lab results were > 2x the ULN).  Continue annual biochemical screening until 2022 (for total of 5 years).    Follow up in 3 months with labs prior.    Patient verbalizes understanding of teaching and instructions. ?The patient and/or caregiver was educated regarding his/her medical care. I reviewed past medical, family/social and medication history during the visit.     Approximately 45 minutes of face-to-face time was spent with patient and >50% of this time was spent counseling/educating on diabetes, answering patient's questions, and coordinating care.     Electronically signed by:    Daryl Eastern, MD  Associate Physician  Endocrinology  [facility], [place]

## 2018-01-19 NOTE — Nursing Note (Signed)
Vital signs taken, allergies verified, screened for pain, tobacco hx verified.     Cherly Erno, MA

## 2018-01-21 ENCOUNTER — Encounter: Payer: Self-pay | Admitting: "Endocrinology

## 2018-01-21 NOTE — Telephone Encounter (Signed)
From: [name]  To: Mal[place]ug, Laury Deep  Sent: 01/20/2018 4:18 PM PDT  Subject: Non-urgent Medical Advice Question    Dr I meant to ask you about my capsule drop test. Do you want me to change any of my diabetic meds for the day before where I can only have liquids from 8 am to 730 pm then I am NPO. Thanks for your guidance.

## 2018-01-26 ENCOUNTER — Other Ambulatory Visit: Payer: Self-pay | Admitting: Family Medicine

## 2018-01-27 ENCOUNTER — Ambulatory Visit (HOSPITAL_BASED_OUTPATIENT_CLINIC_OR_DEPARTMENT_OTHER)
Admission: RE | Admit: 2018-01-27 | Discharge: 2018-01-27 | Disposition: A | Discharge status: Home / Self Care | Payer: Medicare Other | Source: Ambulatory Visit

## 2018-01-27 ENCOUNTER — Ambulatory Visit
Admission: RE | Admit: 2018-01-27 | Discharge: 2018-01-27 | Disposition: A | Discharge status: Home / Self Care | Payer: Medicare Other | Source: Ambulatory Visit | Attending: GASTROENTEROLOGY | Admitting: GASTROENTEROLOGY

## 2018-01-27 DIAGNOSIS — D509 Iron deficiency anemia, unspecified: Secondary | ICD-10-CM

## 2018-01-27 HISTORY — PX: CAPSULE ENDOSCOPY: GILAB00001

## 2018-01-27 NOTE — Telephone Encounter (Signed)
Pt last seen by PCP 12/23/17

## 2018-01-27 NOTE — Nurse Procedure (Signed)
Procedure: CAPSULE ENDOSCOPY   Vital signs obtained and documented. Risks, benefits and alternatives of CAPSULE ENDOSCOPY discussed with the patient including, but not limited to, the risks of capsule retention - requiring laparoscopy, obstruction, missed lesion, and incomplete exam.  Patient has agreed to the procedure and all questions answered. Patient queried for procedure contraindications including swallowing disorders, but not limited to, suspected or known GI strictures, fistulas, or GI obstruction and non were identified.   Patient drank 8 oz of water with one dropper of simethicone solution. Abdominal sensor belt  and data receiver applied to the patient. Good communication of capsule, abdominal sensors and data receiver confirmed per [place].   Patient swallowed the capsule @ 0803 without difficulty.   Study instructions discussed verbally and in writing. Patient instructed to return @ 1603   for data receiver and abdominal sensor belt return upon completion of study. Patient discharged in no apparent distress. Contact phone numbers provided.

## 2018-01-28 NOTE — Procedures (Addendum)
Note Started: 01/28/2018,08:56    Patient:  Paula Daugherty  LOCATION: GI LAB  MR #: [id number]  SEX: female  AGE: 53yr DOB: [birth year] Operation Date: 01/28/2018    GASTROENTEROLOGY OPERATION RECORD    PROCEDURE: Small Bowel Capsule endoscopy    REFERRING PHYSICIAN: Dr. CVirgel Bouquet   SURGEON:Dr. Al-Juburi    ASSISTANT: AJeanette Caprice MD    PREOPERATIVE DIAGNOSES:  IDA    POSTOPERATIVE DIAGNOSES: IDA    INDICATION:  69F with HTN, DM, NAFLD, who presents in evaluation of IDA, EGD/colon unremarkable recently    CONSENT:    Risks and benefits discussed in detail including, but not limited to, capsule retention requiring laparotomy, missed lesions and incomplete examination. The patient was agreeable to proceed and did not have any questions.    PROCEDURE:    The patient presented to the GI lab [place] a fasting state. The patient was prepared for the procedure with a 24-hour liquid diet the day prior to the procedure and 12 hour fasting. The patient is brought to the GI lab where a video recorder and battery pack are attached to the patient's abdomen with the help of a belt. Sensor array belt containing four sensors is attached to the abdomen. The patient is asked to swallow a slurry of simethicone followed by the PillCam small-bowell capsule. The patient was able to swallow the PillCam capsule. The video is recorded for eight hours. The data was reviewed.    FINDINGS:  Gastric passage time is 1 minutes  Small-bowel passage time is 4 hours and 17 minutes  Bowel preparation is fair  The capsule is noted to pass in to the colon.    Specific findings:  Normal villous appearance of the small bowel mucosa  Small bowel is otherwise unremarkable  No sign of GI bleeding    IMPRESSIONS:  Normal capsule exam of the small bowel    RECOMMENDATION:  Follow up with referring provider to continue with work up of iron deficiency anemia    AAleatha Borer MD  GI Fellow. Pager [phone]   I personally reviewed the image and agree with Dr. KAndreas Ohm assessment.  I agree with the impression and recommendation that I formulated with Dr. KAndreas Ohm    Report electronically signed by ARoss Marcus MD. Attending

## 2018-01-29 HISTORY — PX: CAPSULE ENDOSCOPY: GILAB00001

## 2018-02-08 ENCOUNTER — Encounter: Payer: Self-pay | Admitting: GASTROENTEROLOGY

## 2018-02-09 ENCOUNTER — Other Ambulatory Visit: Payer: Self-pay | Admitting: Family Medicine

## 2018-02-09 ENCOUNTER — Other Ambulatory Visit: Payer: Self-pay | Admitting: GASTROENTEROLOGY

## 2018-02-09 DIAGNOSIS — E119 Type 2 diabetes mellitus without complications: Secondary | ICD-10-CM

## 2018-02-09 MED ORDER — METOCLOPRAMIDE 5 MG TABLET
5.0000 mg | ORAL_TABLET | Freq: Four times a day (QID) | ORAL | 2 refills | Status: AC
Start: 2018-02-09 — End: 2018-05-10

## 2018-02-09 NOTE — Telephone Encounter (Signed)
Last seen by  primary care physician 12/23/2017. Appointment with primary care physician scheduled for 03/29/2018.

## 2018-02-24 ENCOUNTER — Other Ambulatory Visit: Payer: Self-pay | Admitting: "Endocrinology

## 2018-02-24 DIAGNOSIS — E119 Type 2 diabetes mellitus without complications: Secondary | ICD-10-CM

## 2018-02-24 NOTE — Telephone Encounter (Signed)
Closed encounter. Patient already has an active rx for Lantus at the pharmacy

## 2018-02-25 ENCOUNTER — Ambulatory Visit: Payer: Medicare Other | Admitting: Otolaryngology

## 2018-02-25 ENCOUNTER — Ambulatory Visit: Payer: Medicare Other | Attending: Audiologist | Admitting: Audiologist

## 2018-02-25 DIAGNOSIS — H9072 Mixed conductive and sensorineural hearing loss, unilateral, left ear, with unrestricted hearing on the contralateral side: Secondary | ICD-10-CM

## 2018-02-25 DIAGNOSIS — H8090 Unspecified otosclerosis, unspecified ear: Secondary | ICD-10-CM

## 2018-02-25 DIAGNOSIS — H90A32 Mixed conductive and sensorineural hearing loss, unilateral, left ear with restricted hearing on the contralateral side: Secondary | ICD-10-CM | POA: Insufficient documentation

## 2018-02-25 DIAGNOSIS — H90A21 Sensorineural hearing loss, unilateral, right ear, with restricted hearing on the contralateral side: Secondary | ICD-10-CM | POA: Insufficient documentation

## 2018-02-25 DIAGNOSIS — H905 Unspecified sensorineural hearing loss: Secondary | ICD-10-CM

## 2018-03-02 ENCOUNTER — Ambulatory Visit: Payer: Self-pay

## 2018-03-16 ENCOUNTER — Encounter: Payer: Self-pay | Admitting: Family Medicine

## 2018-03-17 ENCOUNTER — Ambulatory Visit
Admission: RE | Admit: 2018-03-17 | Discharge: 2018-03-17 | Disposition: A | Discharge status: Home / Self Care | Payer: Medicare Other | Source: Ambulatory Visit | Attending: Internal Medicine | Admitting: Internal Medicine

## 2018-03-17 ENCOUNTER — Encounter: Payer: Self-pay | Admitting: Internal Medicine

## 2018-03-17 ENCOUNTER — Other Ambulatory Visit: Payer: Self-pay | Admitting: Internal Medicine

## 2018-03-17 ENCOUNTER — Ambulatory Visit: Payer: Medicare Other | Admitting: Internal Medicine

## 2018-03-17 VITALS — BP 147/90 | HR 91 | Temp 98.2°F | Wt 173.1 lb

## 2018-03-17 DIAGNOSIS — M79642 Pain in left hand: Secondary | ICD-10-CM

## 2018-03-17 DIAGNOSIS — I10 Essential (primary) hypertension: Secondary | ICD-10-CM

## 2018-03-17 DIAGNOSIS — M25562 Pain in left knee: Secondary | ICD-10-CM

## 2018-03-17 DIAGNOSIS — M25532 Pain in left wrist: Secondary | ICD-10-CM

## 2018-03-17 NOTE — Patient Instructions (Addendum)
Orthopedic hand referral placed.  Please call central scheduling, [phone], if you have not been contacted within 3 business days regarding this.

## 2018-03-17 NOTE — Nursing Note (Signed)
Patient vitals checked, allergies verified, pharmacy verified, and patient screened for pain.     Joyice Faster, MA

## 2018-03-17 NOTE — Progress Notes (Signed)
Chief Complaint   Patient presents with    Falls     fell this morning     Subjective:  Paula Daugherty is a 72yrold female who is here for the following reason:  Primary care physician Dr. JDaiva Huge  Tripped this morning in parking lot and fell on outstretched hand.  Pain at base of thumb and left lateral wrist, which started immediately and it has worsened.    Hit her left knee and has little abrasion.  Pain when bending knee or walking on it.   Did not hit her head and no loc.     Elevated blood pressure.  Taking medications as prescribed.    Review of Systems -   Constitutional: negative.  Musculoskeletal: joint swelling- left wrist and left knee  Neuro: negative for confusion, headaches, paralysis/weakness, numbness or tingling.    Social History     Tobacco Use    Smoking status: Former Smoker     Packs/day: 0.10     Years: 20.00     Pack years: 2.00    Smokeless tobacco: Never Used    Tobacco comment: quit 30 years ago   Substance Use Topics    Alcohol use: Yes     Alcohol/week: 0.0 standard drinks     Comment: rare shares a beer or glass wine every 2-3 months     Drug use: No       Patient Active Problem List   Diagnosis    DM2 (diabetes mellitus, type 2) ([facility]    Depression with anxiety    Stress at home    Leg cramps-right calf    Right ear pain    Fibromyalgia    HTN (hypertension)    GERD (gastroesophageal reflux disease)    Hyperlipidemia with target LDL less than 70    Vitamin D insufficiency    Abnormal LFTs    Renal cyst ( 6.4 cm cyst left kidney)    Cough    Fatty liver    Macroalbuminuric diabetic nephropathy (HCC)    History of actinic keratoses    Cataract    Ptosis of eyelid    Liver fibrosis (HCC)    Adrenal nodule (HCC)    Medication Therapy Auth    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HCC)    Mixed conductive and sensorineural hearing loss of both ears    Neoplasm of uncertain behavior of skin    Abdominal pain, generalized    Nausea without vomiting     Asymptomatic microscopic hematuria    Subcutaneous nodule    Angina pectoris ([facility]     Current Outpatient Medications on File Prior to Visit   Medication Sig Dispense Refill    Amlodipine (NORVASC) 10 mg Tablet Take 1 tablet by mouth every day. 90 tablet 3    Atorvastatin (LIPITOR) 10 mg Tablet Take 1 tablet by mouth every day at bedtime. 90 tablet 1    Blood Glucose Meter (ONETOUCH ULTRA2) Kit 1 kit one time. 1 kit 0    Blood Sugar Diagnostic (ONETOUCH ULTRA TEST) Strips Use to test blood glucose 3x/day 300 strip 11    Clindamycin (CLEOCIN-T) 1 % Gel Apply to sores in the scalp twice daily as needed with flaring. 30 g 2    empagliflozin (JARDIANCE) 25 mg Tablet Take 1 tablet by mouth every day. 90 tablet 3    FLUTICASONE/SALMETEROL (ADVAIR DISKUS INHA) Take  by inhalation.      GlipiZIDE (GLUCOTROL XL) 10 mg SR Tablet Take  2 tablets by mouth every morning with a meal. (diabetes) 180 tablet 3    Insulin Glargine (LANTUS SOLOSTAR U-100 INSULIN) 100 unit/mL (3 mL) Pen Inject 26 units subcutaneously every night at bedtime. 8 pen 3    linaclotide (LINZESS) 145 mcg Capsule Take 1 capsule by mouth every day. 30 capsule 0    Losartan (COZAAR) 25 mg Tablet Take 1 tablet by mouth every day. 90 tablet 3    Magnesium Citrate Liquid Drink contents of bottle followed by at least 2 cups of water. Do this around 12 pm 2 days prior to colonoscopy. 1 bottle 0    Metformin (GLUCOPHAGE) 1,000 mg tablet Take 1 tablet by mouth 2 times daily with meals. (diabetes) 180 tablet 3    Metoclopramide (REGLAN) 5 mg Tablet Take 1 tablet by mouth 4 times daily. 30 minutes before each meal 120 tablet 2    Omeprazole (PRILOSEC) 20 mg Delayed Release Capsule TAKE 1 CAPSULE BY MOUTH TWICE DAILY BEFORE MEALS 180 capsule 1    Ondansetron (ZOFRAN) 8 mg Tablet Take 1 tablet by mouth every 8 hours if needed. 270 tablet 3    Venlafaxine ([name]) 75 mg Tablet TAKE 2 TABLETS BY MOUTH EVERY MORNING AND TAKE 1 TABLET BY MOUTH EVERY EVENING  (Patient taking differently: Take 150 mg by mouth every day. TAKE 2 TABLETS BY MOUTH EVERY MORNING AND TAKE 1 TABLET BY MOUTH EVERY EVENING          ) 270 tablet 1     No current facility-administered medications on file prior to visit.      OBJECTIVE:  BP 147/90 (SITE: right arm, Orthostatic Position: sitting, Cuff Size: regular)   Pulse 91   Temp 36.8 C (98.2 F) (Temporal)   Wt 78.5 kg (173 lb 1 oz)   LMP 05/06/1983   SpO2 96%   BMI 31.65 kg/m   General Appearance: healthy, alert, no distress, pleasant affect, cooperative.  Skin:  Superficial abrasion left knee, no discharge.  Musculoskeletal: left lateral wrist and base of thumb tenderness to palpation. Normal range of motion of finger and good grip strength. Left knee with tenderness to palpation over patella. Normal range of motion.     ASSESSMENT:  (M25.562) Acute pain of left knee  (primary encounter diagnosis)  Comment: reviewed xrays with patient, which were read by radiology.  No fracture noted  Plan: KNEE 3 VIEWS, LEFT        Recommend to rest, ice and elevate the knee. She has a compression brace that she can try as well.   Okay to take acetaminophen 1000 mg three times a day.     (M25.532) Left wrist pain  Comment: reviewed xrays with patient, which were read by radiology; lucency in the sesamoid at the volar aspect of IP joint of thumb  Plan: ORTHOPEDIC-HAND REFERRAL, DURABLE MEDICAL         EQUIPMENT WRIST 2 VIEWS, LEFT  As above.           (P71.062) Left hand pain  Plan: ORTHOPEDIC-HAND REFERRAL, CANCELED: HAND 2         VIEWS, LEFT        As above.     (I10) Essential hypertension  Comment: slightly elevated today  Plan: follow-up with primary care physician.     PLAN:  See Orders. Discussed with the patient and all questions fully answered. She will call me if any problems arise.     I did review patient's past medical and family/social  history, no changes noted.  Barriers to Learning assessed: none. Patient verbalizes understanding of  teaching and instructions.    Electronically signed by:  Cephus Shelling Jimmye Norman, D.O.  Associate Physician  [place]

## 2018-03-19 ENCOUNTER — Other Ambulatory Visit: Payer: Self-pay | Admitting: Neurology

## 2018-03-19 DIAGNOSIS — F418 Other specified anxiety disorders: Secondary | ICD-10-CM

## 2018-03-22 ENCOUNTER — Ambulatory Visit: Payer: Medicare Other | Attending: Physician Assistant | Admitting: Physician Assistant

## 2018-03-22 ENCOUNTER — Encounter: Payer: Self-pay | Admitting: Physician Assistant

## 2018-03-22 VITALS — BP 147/81 | HR 66 | Temp 97.8°F | Ht 62.0 in | Wt 175.3 lb

## 2018-03-22 DIAGNOSIS — M25532 Pain in left wrist: Secondary | ICD-10-CM | POA: Insufficient documentation

## 2018-03-22 DIAGNOSIS — Z9181 History of falling: Secondary | ICD-10-CM | POA: Insufficient documentation

## 2018-03-22 NOTE — Telephone Encounter (Signed)
This is a  REFILL AUTHORIZATION for the electronic refill request for effexor prescribed by Dr Donnamarie Poag.  Medication refilled per Prescription Refill by Clinic RN Standardized Procedure Policy RM-86.  Collier Flowers, RN

## 2018-03-22 NOTE — Nursing Note (Signed)
Patient roomed, identifiers x2, allergies, tobacco use and medications reviewed.  Vitals obtained.  Pain at level 3/10.  Location of pain left hand. Pain medications none taken.  Blair Dolphin, LVN

## 2018-03-22 NOTE — Progress Notes (Signed)
PATIENT:  Paula Daugherty, Paula Daugherty  MR #:  [id number]  DOB:  01/31/1946  SEX:  Female  AGE:  50yr SERVICE DATE:  03/22/2018    ORTHOPAEDICS CLINIC NOTE    Chief complaint: Left hand/wrist pain    DOI/date of onset-03/17/2018    SUBJECTIVE:  NChesni Vosis a 89yrld, right-hand dominant female who is here with her husband for initial evaluation of left hand and wrist pain.  She stumbled over a curb at a parking lot landed on her knees and her outstretched left hand.    She had quite a bit of discomfort to the wrist and hand immediately.    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    She was placed into a thumb spica splint.    Initially, she had ulnar-sided and dorsal wrist pain as well but today her only area of discomfort is to the thenar eminence.  No complaints of numbness or tingling into the fingers.  No complaints of difficulty with motion of the fingers of the thumb.  No complaint of mid or proximal forearm pain or elbow pain.,     PROBLEM LIST:  Patient Active Problem List   Diagnosis    DM2 (diabetes mellitus, type 2) ([facility]   Depression with anxiety    Stress at home    Leg cramps-right calf    Right ear pain    Fibromyalgia    HTN (hypertension)    GERD (gastroesophageal reflux disease)    Hyperlipidemia with target LDL less than 70    Vitamin D insufficiency    Abnormal LFTs    Renal cyst ( 6.4 cm cyst left kidney)    Cough    Fatty liver    Macroalbuminuric diabetic nephropathy (HCC)    History of actinic keratoses    Cataract    Ptosis of eyelid    Liver fibrosis (HCC)    Adrenal nodule (HCC)    Medication Therapy Auth    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HCC)    Mixed conductive and sensorineural hearing loss of both ears    Neoplasm of uncertain behavior of skin    Abdominal pain, generalized    Nausea without  vomiting    Asymptomatic microscopic hematuria    Subcutaneous nodule    Angina pectoris (HC[facility]      PAST MEDICAL HISTORY:  Past Medical History:   Diagnosis Date    Angina pectoris ([facility] [place]/22/2019    Anxiety     Asthma     Cirrhosis (HCM[facility]    Constipation, chronic     Depression     Diabetes mellitus ([facility]    Fatty liver     Hypertension     Kidney disease 2015    cyst left kidney per pt    Liver fibrosis ([facility]    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/20/2016    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HCL[facility]12/14/2017    Psychiatric illness        CURRENT MEDICATIONS:    Current Outpatient Medications:     Amlodipine (NORVASC) 10 mg Tablet, Take 1 tablet by mouth every day., Disp: 90 tablet, Rfl: 3    Atorvastatin (LIPITOR) 10 mg Tablet, Take 1 tablet by mouth every day at bedtime., Disp:  90 tablet, Rfl: 1    Blood Glucose Meter (ONETOUCH ULTRA2) Kit, 1 kit one time., Disp: 1 kit, Rfl: 0    Blood Sugar Diagnostic (ONETOUCH ULTRA TEST) Strips, Use to test blood glucose 3x/day, Disp: 300 strip, Rfl: 11    Clindamycin (CLEOCIN-T) 1 % Gel, Apply to sores in the scalp twice daily as needed with flaring., Disp: 30 g, Rfl: 2    empagliflozin (JARDIANCE) 25 mg Tablet, Take 1 tablet by mouth every day., Disp: 90 tablet, Rfl: 3    FLUTICASONE/SALMETEROL (ADVAIR DISKUS INHA), Take  by inhalation., Disp: , Rfl:     GlipiZIDE (GLUCOTROL XL) 10 mg SR Tablet, Take 2 tablets by mouth every morning with a meal. (diabetes), Disp: 180 tablet, Rfl: 3    Insulin Glargine (LANTUS SOLOSTAR U-100 INSULIN) 100 unit/mL (3 mL) Pen, Inject 26 units subcutaneously every night at bedtime., Disp: 8 pen, Rfl: 3    linaclotide (LINZESS) 145 mcg Capsule, Take 1 capsule by mouth every day., Disp: 30 capsule, Rfl: 0    Losartan (COZAAR) 25 mg Tablet, Take 1 tablet by mouth every day., Disp: 90 tablet, Rfl: 3    Magnesium Citrate Liquid, Drink contents of bottle followed by at least 2 cups of water. Do this  around 12 pm 2 days prior to colonoscopy., Disp: 1 bottle, Rfl: 0    Metformin (GLUCOPHAGE) 1,000 mg tablet, Take 1 tablet by mouth 2 times daily with meals. (diabetes), Disp: 180 tablet, Rfl: 3    Metoclopramide (REGLAN) 5 mg Tablet, Take 1 tablet by mouth 4 times daily. 30 minutes before each meal, Disp: 120 tablet, Rfl: 2    Omeprazole (PRILOSEC) 20 mg Delayed Release Capsule, TAKE 1 CAPSULE BY MOUTH TWICE DAILY BEFORE MEALS, Disp: 180 capsule, Rfl: 1    Ondansetron (ZOFRAN) 8 mg Tablet, Take 1 tablet by mouth every 8 hours if needed., Disp: 270 tablet, Rfl: 3    Venlafaxine ([name]) 75 mg Tablet, TAKE 2 TABLETS BY MOUTH EVERY MORNING AND TAKE 1 TABLET BY MOUTH EVERY EVENING (Patient taking differently: Take 150 mg by mouth every day. TAKE 2 TABLETS BY MOUTH EVERY MORNING AND TAKE 1 TABLET BY MOUTH EVERY EVENING      ), Disp: 270 tablet, Rfl: 1    ALLERGIES:  Contrast dye [radiopaque agent]; Hydrochlorothiazide; Januvia [sitagliptin]; Lyrica [pregabalin]; Omnipaque [iohexol]; Prozac [fluoxetine hcl]; Sulfa (sulfonamide antibiotics); and Topiramate    Latex allergy:  Prior blood transfusion:    PAST SURGICAL HISTORY:  Past Surgical History:   Procedure Laterality Date    BIOFEEDBACK PERI/URO/RECTAL  10/21/2017    session #1: N Score = 5.4    BIOFEEDBACK PERI/URO/RECTAL  11/04/2017    session #2    BIOFEEDBACK PERI/URO/RECTAL  11/18/2017    Session #3    BIOFEEDBACK PERI/URO/RECTAL  12/02/2017    Session #4    BIOFEEDBACK PERI/URO/RECTAL  12/30/2017    Session #5    CAPSULE ENDOSCOPY  01/27/2018    CAPSULE ENDOSCOPY  01/29/2018         COLONOSCOPY  01/04/14    polyp--TA and erosion; hemorrhoids; tics; repeat x 3 yrs    COLONOSCOPY  12/16/2017    TA; repeat in 3 yrs    EGD      [facility] COLONOSCOPY,REMV LESN,SNARE  12/16/2017    MAMMOPLASTY, REDUCTION  1985    MANOMETRY  09/02/2017    work on constipation; kegels and biofeedback; proceed w/colonoscopy     PHACOEMULSIFICATION, CATARACT Right 02/12/2015     with  IOL    PR ESOPHAGOGASTRODUODENOSCOPY TRANSORAL DIAGNOSTIC  02/12/2016    EGD--polyp--path pending; antral gastritis; no varices     PR ESOPHAGOGASTRODUODENOSCOPY TRANSORAL DIAGNOSTIC  06/11/2016    EGD--biopsy at site of carcinoid tumor path pending     PR ESOPHAGOGASTRODUODENOSCOPY TRANSORAL DIAGNOSTIC      EGD--folds in duodenum--path pending     PR ESOPHAGOGASTRODUODENOSCOPY TRANSORAL DIAGNOSTIC  04/2017    EGD; repeat EGD in 1 year     PR KNEE SCOPE,DIAGNOSTIC  1980s    Knee arthroscopy    PR LIGATE FALLOPIAN TUBE      Tubal ligation    PR REPAIR TYMPANIC MEMBRANE      Tympanoplasty    PR TOTAL ABDOM HYSTERECTOMY  1980s    partial; no BSO    REPAIR, BLEPHAROPTOSIS Bilateral 08/06/2015    Blepharoplasty bilateral upper lids       FAMILY HISTORY:  The patient has a family history of    SOCIAL HISTORY:  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 strain: Not on file    Food insecurity:     Worry: Not on file     Inability: Not on file    Transportation needs:     Medical: Not on file     Non-medical: Not on file   Tobacco Use    Smoking status: Former Smoker     Packs/day: 0.10     Years: 20.00     Pack years: 2.00    Smokeless tobacco: Never Used    Tobacco comment: quit 30 years ago   Substance and Sexual Activity    Alcohol use: Yes     Alcohol/week: 0.0 standard drinks     Comment: rare shares a beer or glass wine every 2-3 months     Drug use: No    Sexual activity: Yes     Partners: Male   Lifestyle    Physical activity:     Days per week: Not on file     Minutes per session: Not on file    Stress: Not on file   Relationships    Social connections:     Talks on phone: Not on file     Gets together: Not on file      Attends religious service: Not on file     Active member of club or organization: Not on file     Attends meetings of clubs or organizations: Not on file     Relationship status: Not on file    Intimate partner violence:     Fear of current or ex partner: Not on file     Emotionally abused: Not on file     Physically abused: Not on file     Forced sexual activity: Not on file   Other Topics Concern    Not on file   Social History Narrative    Not on file       REVIEW OF SYSTEMS:  14 system review is completed. Pertinent positives are listed below.  Other positives have been addressed by the primary care and or have been referred for  specialized treatment.    LMP 05/06/1983    BP 147/81 (SITE: right arm, Orthostatic Position: sitting, Cuff Size: regular)   Pulse 66   Temp 36.6 C (97.8 F) (Temporal)   Ht 1.575 m (_0 )   Wt 79.5 kg (175 lb 4.3 oz)   LMP 05/06/1983   BMI 32.06 kg/m     PHYSICAL EXAM:  Physical Exam:General Appearance: healthy, alert, no distress, pleasant affect, cooperative.  Examination of the left arm shows no discomfort to palpation or range of motion at the shoulder or the elbow.  There is no discomfort to the mid or proximal forearm.  Left wrist shows no obvious bony deformities, swelling, ecchymosis or skin defects.  She is nontender over the ulnar styloid, the ECU, the dorsal wrist, that the DRUJ, the radial styloid or the anatomic snuffbox.    Nontender over the first and third extensor compartments.  Volarly she is quite tender over the base of the thenar eminence at the area of the distal pole of the scaphoid.    She has mild tenderness to the FCR but has intact function.  Nontender over the Piso- triquetral joint or the FCU    Wrist flexion 45 extension 60.    She has mild discomfort with circumduction grind test of the thumb CMC.    She has intact EPL and FPL and has full flexion extension of all the digits.  She is nontender about the IP joint or the MCP and is stable to  radial and ulnar stressing of both joints.  Sensation is intact to the fingers and she has good capillary refill to all the digits.    Imaging-plain films of the left wrist and hand done 03/17/2018 were reviewed personally with Dr. Carma Lair and then with the patient.    Radiologist had made note of a possible fracture at the sesamoid of the IP of the thumb but the patient is nontender there and is felt this more than likely represents a bipartite sesamoid.  No carpal row fractures or abnormalities are seen other than the arthritis at the thumb [facility] joint.    IMPRESSION:  Ongoing volar radial wrist pain after a stumble and fall 5 days ago (03/17/2018, greatest area of tenderness is at the distal pole of the scaphoid.    PLAN:  I have discussed case and reviewed the x-rays with Dr. Carma Lair.  We have explained to the patient initially plain x-rays may not show all fractures and that for comfort and protection we would recommend a thumb spica cast.    Patient states her understanding of this.    This was then fitted.    We will plan on seeing her back in approximately 2 weeks for x-rays of the left wrist, including a scaphoid view, out of the cast.  Patient was given cast care instructions and precautions.    We will see her back prior to that appointment she has any problems questions or concerns.  Patient states understanding agreement with plan.      Shirlean Kelly, PA-C/Dr. Carma Lair  Physician Assistant  Orthopaedic Surgery

## 2018-03-23 ENCOUNTER — Ambulatory Visit
Admission: RE | Admit: 2018-03-23 | Discharge: 2018-03-23 | Disposition: A | Discharge status: Home / Self Care | Payer: Medicare Other | Source: Ambulatory Visit | Attending: Gastroenterology | Admitting: Gastroenterology

## 2018-03-23 ENCOUNTER — Ambulatory Visit: Payer: Medicare Other | Attending: Otolaryngology | Admitting: Otolaryngology

## 2018-03-23 VITALS — BP 157/90 | HR 73 | Temp 98.3°F | Ht 62.0 in | Wt 177.2 lb

## 2018-03-23 DIAGNOSIS — K5900 Constipation, unspecified: Secondary | ICD-10-CM

## 2018-03-23 DIAGNOSIS — Z8669 Personal history of other diseases of the nervous system and sense organs: Secondary | ICD-10-CM | POA: Insufficient documentation

## 2018-03-23 DIAGNOSIS — H8093 Unspecified otosclerosis, bilateral: Secondary | ICD-10-CM

## 2018-03-23 DIAGNOSIS — H90A32 Mixed conductive and sensorineural hearing loss, unilateral, left ear with restricted hearing on the contralateral side: Secondary | ICD-10-CM

## 2018-03-23 DIAGNOSIS — H6123 Impacted cerumen, bilateral: Secondary | ICD-10-CM

## 2018-03-23 DIAGNOSIS — H7292 Unspecified perforation of tympanic membrane, left ear: Secondary | ICD-10-CM | POA: Insufficient documentation

## 2018-03-23 DIAGNOSIS — K5909 Other constipation: Secondary | ICD-10-CM | POA: Insufficient documentation

## 2018-03-23 HISTORY — PX: BIOFEEDBACK PERI/URO/RECTAL: AMBPRO0204

## 2018-03-23 NOTE — Progress Notes (Signed)
Patient: Paula Daugherty  Location: Otolaryngology Clinic   Medical Record Number: [id number] Sex: female Age: 68yr Date of Service: 03/23/2018 Date of Birth: [birth year]    OTOLARYNGOLOGY - HEAD AND NECK SURGERY  INITIAL CONSULTATION  NOTE    Dear Dr. JDaiva Huge NIzora Gala MD:    Thank you very much for referring your patient, Paula Daugherty to our clinic.     CHIEF COMPLAINT:  Hearing loss    HISTORY OF PRESENT ILLNESS:  Paula Daugherty a 89yrld female with history of bilateral otosclerosis and bilateral stapedectomies with House wire prosthesis ([facility] in 1970s[facility] left chronic ear perforation (hx of unsuccessful Paper patch placed in Lt TM 2009). She was previously evaluated by Dr. DiFerrel Logann 2015 for right otalgia. Otalgia is improved with improved TMJ symptoms. She presents today with worsening word discrimination in the left ear. Otherwise, she has no symptoms. She denies drainage from either ear or vertigo. She does have intermittent high pitched tinnitus in both ears.     No d/d/i/v      Past medical history, past surgical history, drug allergies, medications, and pertinent social and family medical history were reviewed with the patient, and details of these were provided by the patient in the ENT Clinic new patient questionnaire. This was reviewed and verified with the patient today.     Review of Systems is performed, including general, cardiovascular, respiratory, gastrointestinal, genitourinary, musculoskeletal, endocrinologic, dermatologic, hematologic, immunologic, neurologic, and psychiatric. Details of these were provided by the patient in the ENT Clinic new patient questionnaire. Please refer to this questionnaire for full details. This was reviewed and verified with the patient today. The pertinent positives and negatives are listed in the history of present illness.       PHYSICAL EXAMINATION:  General: The patient appears well-developed, well-nourished, is in no distress,  pleasant and cooperative. Please refer to the EMR for vital signs.   Psychiatric: Mood and affect are stable. Oriented to person, place, time and condition.   Dermatologic: Skin warm and dry.  Head: Normocephalic, grossly normal appearance. No obvious external lesions.  Face: Facial nerve function intact and symmetric bilaterally.   Eyes: Vision grossly intact; extraocular movements intact and symmetric bilaterally, no conjunctival lesions. Gaze is conjugate.   Nose: No external deformities; midline septum, nasal cavities clear without lesions or obstruction bilaterally.  Oral cavity: Clear, normal lips, normal mucosa, no lesions.   Neck: Supple, no masses, normal cervical musculoskeletal exam.  Thyroid: Normal, no masses, no thyromegaly.   Neurologic: Gait and station normal. Moves all extremities equally. 5/5 strength in all extremities.   Respiratory: Breathing comfortably without overuse of accessory muscles of respiration. No shortness of breath or tachypnea. No audible wheezing or stridor.  Cranial nerves: Cranial nerves II through XII intact and symmetric bilaterally, except for VIII    PROCEDURE:  Bilateral ear examination under binocular otomicroscopy:  Right ear:  Auricle normal. EAC with cerumen, removed with curette. Tympanic membrane clear. Wire loop visible under posterior/superior quadrant. Mobile on positive/negative pneumatic otoscopy.   Left ear:  Auricle normal. EAC with cerumen, removed with curette. Tympanic membrane with crusting, removed with 90 degree pick. Posterior marginal perforation 5% with clean edges and no skin ingrowth. Stable from 2015 exam. Wire loop prosthesis seen in posterior/superior quadrant.     Binocular otomicroscopy was indicated to better visualize and evaluate the ears bilaterally given the patient's history and physical exam findings.  See binocular otomicroscopy examination  findings below.    AUDIOGRAM:  I personally reviewed audiometry from 02/25/18 and find the  following results:  Pure tone audiometry:   AD -mild to severe SNHL   AS -moderate to severe MHL with 30 dB ABG at 250 Hz and 20 ABG at 4000 Hz  Speech reception threshold:   AD: 30  AS: 55  Speech discrimination:   AD: 96%  AS: 80% (decreased from 94% 2015)  Tympanometry: AD - Type A; AS - Type B, large volume  Acoustic Reflexes: not tested        09/12/13             IMPRESSION:  1. H/o otosclerosis s/p B/L stapedectomies with wire house prosthesis 30+ years ago with continued good closure of air bone gaps  2. Worsening Left Sided word discrimination, likely due to otosclerotic involvement of cochlea   3. Left posterior marginal 5% perforation with no sign of cholesteatoma ingrowth             PLAN:  1. Probable diagnoses discussed with the patient.   2. Possible treatment options discussed with the patient.   3. Patient wanted to surgical options for improving hearing. We discussed the minimal ABG and that performing revision stapedectomy would be risky and we would as likely worsen her hearing as improve it. Therefore, we would not recommend it at this time. She indicates understanding and is in agreement with this suggestion.   4. We also discussed monitoring of the perforation. She denies infections or otorrhea and exam revealed stable perforation. Follow-up in 3 years. If she experiences drainage or other signs of infection, or if her hearing worsens markedly, she will contact me for evaluation and further discussion of options.            The patient was seen and evaluated with Dr. Ferrel Logan. We formulated the plan together.     --------------------------------    Turner Daniels, M.D. PGY-5  Otolaryngology, Head and Neck Surgery  Service Pager: [place] Pager: [phone]  PI : [phone]

## 2018-03-23 NOTE — Progress Notes (Signed)
Patient identification verified Paula Daugherty [birth year]) x2. Allergies checked. Vitals obtained. Pain level and barriers assessed. Medication reconciled. MD and patient informed of elevated BP. Patient instructed to follow up with PCP. Patient verbalized understanding and agreed to do so. Benedetto Goad, Sr. LVN

## 2018-03-23 NOTE — Nurse Procedure (Signed)
NMR session #6 completed.  Patient encouraged to continue with daily rectal Kegel exercises indefinitely in an attempt to improve rectal sphincter tone.

## 2018-03-29 ENCOUNTER — Ambulatory Visit
Admission: RE | Admit: 2018-03-29 | Discharge: 2018-03-29 | Disposition: A | Discharge status: Home / Self Care | Payer: Medicare Other | Source: Ambulatory Visit | Attending: Family Medicine | Admitting: Family Medicine

## 2018-03-29 ENCOUNTER — Other Ambulatory Visit: Payer: Self-pay | Admitting: Physician Assistant

## 2018-03-29 ENCOUNTER — Ambulatory Visit: Payer: Medicare Other | Admitting: Family Medicine

## 2018-03-29 ENCOUNTER — Encounter: Payer: Self-pay | Admitting: Family Medicine

## 2018-03-29 ENCOUNTER — Ambulatory Visit: Payer: Medicare Other | Attending: Family Medicine

## 2018-03-29 VITALS — BP 155/92 | HR 96 | Temp 98.2°F | Resp 16 | Ht 62.0 in | Wt 176.8 lb

## 2018-03-29 DIAGNOSIS — R059 Cough, unspecified: Secondary | ICD-10-CM

## 2018-03-29 DIAGNOSIS — Z794 Long term (current) use of insulin: Secondary | ICD-10-CM

## 2018-03-29 DIAGNOSIS — R61 Generalized hyperhidrosis: Secondary | ICD-10-CM

## 2018-03-29 DIAGNOSIS — R6889 Other general symptoms and signs: Secondary | ICD-10-CM

## 2018-03-29 DIAGNOSIS — R05 Cough: Secondary | ICD-10-CM

## 2018-03-29 DIAGNOSIS — S62102D Fracture of unspecified carpal bone, left wrist, subsequent encounter for fracture with routine healing: Secondary | ICD-10-CM

## 2018-03-29 DIAGNOSIS — E1129 Type 2 diabetes mellitus with other diabetic kidney complication: Secondary | ICD-10-CM

## 2018-03-29 LAB — TSH WITH FREE T4 REFLEX: Thyroid Stimulating Hormone: 1.21 u[IU]/mL (ref 0.35–3.30)

## 2018-03-29 NOTE — Patient Instructions (Addendum)
No fasting for labs.  These can check A1c, thyroid function and TB screen.     An X-ray has been ordered for you. Please go to the front desk at [address] [place] to obtain your x-ray. You will receive a letter or telephone call with your results.     Please see about possibly getting Shinrix from outside pharmacy--repeat due after 2 mos with 1 booster to complete series of 2.

## 2018-03-29 NOTE — Progress Notes (Signed)
Paula Daugherty is a 14yrfemale who presents with a chief complaint of "feeling hot and not sure if these are hot flashes".    Chief Complaint   Patient presents with    Diabetes       1. Heat intolerance: x wks to mos; previous hot flashes that slowly resolved; occasional night sweats and dry cough x mos   2. DM2: due to recheck labs; patient amenable to recheck today; weight has increased along with hunger   3. GHCM: she plans to get Flu vaccine today along with spouse; Shinrix also due     ROS:   .Constitutional: no fever/chills.  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 90 tablet 3    Atorvastatin (LIPITOR) 10 mg Tablet Take 1 tablet by mouth every day at bedtime. 90 tablet 1    Blood Glucose Meter (ONETOUCH ULTRA2) Kit 1 kit one time. 1 kit 0    Blood Sugar Diagnostic (ONETOUCH ULTRA TEST) Strips Use to test blood glucose 3x/day 300 strip 11    Clindamycin (CLEOCIN-T) 1 % Gel Apply to sores in the scalp twice daily as needed with flaring. 30 g 2    empagliflozin (JARDIANCE) 25 mg Tablet Take 1 tablet by mouth every day. 90 tablet 3    FLUTICASONE/SALMETEROL (ADVAIR DISKUS INHA) Take  by inhalation.      GlipiZIDE (GLUCOTROL XL) 10 mg SR Tablet Take 2 tablets by mouth every morning with a meal. (diabetes)  180 tablet 3    Insulin Glargine (LANTUS SOLOSTAR U-100 INSULIN) 100 unit/mL (3 mL) Pen Inject 26 units subcutaneously every night at bedtime. 8 pen 3    linaclotide (LINZESS) 145 mcg Capsule Take 1 capsule by mouth every day. 30 capsule 0    Losartan (COZAAR) 25 mg Tablet Take 1 tablet by mouth every day. 90 tablet 3    Magnesium Citrate Liquid Drink contents of bottle followed by at least 2 cups of water. Do this around 12 pm 2 days prior to colonoscopy. 1 bottle 0    Metformin (GLUCOPHAGE) 1,000 mg tablet Take 1 tablet by mouth 2 times daily with meals. (diabetes) 180 tablet 3    Metoclopramide (REGLAN) 5 mg Tablet Take 1 tablet by mouth 4 times daily. 30 minutes before each meal 120 tablet 2    Omeprazole (PRILOSEC) 20 mg Delayed Release Capsule TAKE 1 CAPSULE BY MOUTH TWICE DAILY BEFORE MEALS 180 capsule 1    Ondansetron (ZOFRAN) 8 mg Tablet Take 1 tablet by mouth every 8 hours if needed. 270 tablet 3  Venlafaxine ([name]) 75 mg Tablet TAKE 2 TABLETS BY MOUTH EVERY MORNING AND TAKE 1 TABLET BY MOUTH EVERY EVENING 270 tablet 1     No current facility-administered medications on file prior to visit.      Allergies:   Allergies   Allergen Reactions    Contrast Dye [Radiopaque Agent] Hives    Hydrochlorothiazide Other-Reaction in Comments     Patient reports    Januvia [Sitagliptin] Other-Reaction in Comments     Patient reports    Lyrica [Pregabalin] Other-Reaction in Comments     Patient reports    Omnipaque [Iohexol] Other-Reaction in Comments     Patient reports    Prozac [Fluoxetine Hcl] Other-Reaction in Comments     Patient reports    Sulfa (Sulfonamide Antibiotics) Rash    Topiramate Other-Reaction in Comments     Hairfall       Social History     Socioeconomic History    Marital status: MARRIED     Spouse name: Not on file    Number of children: 1    Years of education: Not on file    Highest education level: Not on file   Occupational History    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 [employer]    Social Needs    Financial resource strain: Not on file    Food insecurity:     Worry: Not on file     Inability: Not on file    Transportation needs:     Medical: Not on file     Non-medical: Not on file   Tobacco Use    Smoking status: Former Smoker     Packs/day: 0.10     Years: 20.00     Pack years: 2.00    Smokeless tobacco: Never Used    Tobacco comment: quit 30 years ago   Substance and Sexual Activity    Alcohol use: Yes     Alcohol/week: 0.0 standard drinks     Comment: rare shares a beer or glass wine every 2-3 months     Drug use: No    Sexual activity: Yes     Partners: Male   Lifestyle    Physical activity:     Days per week: Not on file     Minutes per session: Not on file    Stress: Not on file   Relationships    Social connections:     Talks on phone: Not on file     Gets together: Not on file     Attends religious service: Not on file     Active member of club or organization: Not on file     Attends meetings of clubs or organizations: Not on file     Relationship status: Not on file    Intimate partner violence:     Fear of current or ex partner: Not on file     Emotionally abused: Not on file     Physically abused: Not on file     Forced sexual activity: Not on file   Other Topics Concern    Not on file   Social History Narrative    Not on file     Family History   Problem Relation Name Age of Onset    Diabetes Father      Heart Mother          MI at 41s     Non-contributory Brother      Non-contributory Brother  PE:  BP 147/90 (SITE: right arm, Orthostatic Position: sitting, Cuff Size: regular)   Pulse 96   Temp 36.8 C (98.2 F) (Temporal)   Resp 16   Ht 1.575 m (_0 )   Wt 80.2 kg (176 lb 12.9 oz)   LMP 05/06/1983   BMI 32.34 kg/m   .General Appearance: healthy, alert, no distress, pleasant affect, cooperative.  Heart:  normal rate and regular rhythm, no murmurs, clicks, or gallops.  Lungs: clear to auscultation.  Extremities:  no cyanosis,  clubbing, or edema.  Mental Status: appropriate affect, behavior and mentation     Assessment and Plan:  (R68.89) Heat intolerance  (primary encounter diagnosis)  Comment: new x mos; unclear diagnosis or etiology   Plan: TSH WITH FREE T4 REFLEX          (R61) Night sweats  Comment: and dry cough x mos; to check CXR and lab   Plan: QUANTIFERON,TB GOLD PLUS          (E11.29,  Z79.4) Type 2 diabetes mellitus with other diabetic kidney complication, with long-term current use of insulin ([place])  Comment: due for labs   Plan: weight reduction encouraged     (R05) Cough  Comment: x mos; unclear diagnosis or etiology   Plan: QUANTIFERON,TB GOLD PLUS, DX CHEST 2 VIEWS            Total encounter time including history, physical examination, and coordination of care was approximately 20 minutes of which more than 50% was spent counseling regarding assessment/diagnosis and treatment plan. No guarantees were made regarding her medical care or treatment outcome. Barriers to Learning: none.  Patient verbalizes understanding of teaching and instructions.    Electronically signed by:    [name], MD  [place], [place] Board of Family Medicine  Associate physician [facility], [place]   [phone]

## 2018-03-29 NOTE — Nursing Note (Signed)
Patient vitals checked, allergies verified, pharmacy verified and patient screened for pain. By Ashland Wiseman, MAII

## 2018-03-30 ENCOUNTER — Ambulatory Visit: Payer: Medicare Other | Admitting: "Endocrinology

## 2018-03-31 LAB — QUANTIFERON,TB GOLD PLUS
Mit - Nil: 6.46 IU/mL
Nil: 0.01 IU/mL
QFT (Qual): NEGATIVE
TB1 - Nil: 0.03 IU/mL
TB2 - Nil: 0.05 IU/mL

## 2018-04-05 ENCOUNTER — Other Ambulatory Visit: Payer: Self-pay | Admitting: Physician Assistant

## 2018-04-05 ENCOUNTER — Ambulatory Visit
Admission: RE | Admit: 2018-04-05 | Discharge: 2018-04-05 | Disposition: A | Discharge status: Home / Self Care | Payer: Medicare Other | Source: Ambulatory Visit | Attending: Diagnostic Radiology | Admitting: Diagnostic Radiology

## 2018-04-05 ENCOUNTER — Ambulatory Visit: Payer: Medicare Other | Attending: Physician Assistant | Admitting: Physician Assistant

## 2018-04-05 ENCOUNTER — Encounter: Payer: Self-pay | Admitting: Physician Assistant

## 2018-04-05 VITALS — BP 150/77 | HR 72 | Temp 97.9°F | Ht 62.0 in | Wt 175.7 lb

## 2018-04-05 DIAGNOSIS — S62012A Displaced fracture of distal pole of navicular [scaphoid] bone of left wrist, initial encounter for closed fracture: Secondary | ICD-10-CM

## 2018-04-05 DIAGNOSIS — M25532 Pain in left wrist: Secondary | ICD-10-CM | POA: Insufficient documentation

## 2018-04-05 DIAGNOSIS — R52 Pain, unspecified: Secondary | ICD-10-CM

## 2018-04-05 DIAGNOSIS — S62102D Fracture of unspecified carpal bone, left wrist, subsequent encounter for fracture with routine healing: Secondary | ICD-10-CM | POA: Insufficient documentation

## 2018-04-05 DIAGNOSIS — S62014D Nondisplaced fracture of distal pole of navicular [scaphoid] bone of right wrist, subsequent encounter for fracture with routine healing: Secondary | ICD-10-CM

## 2018-04-05 DIAGNOSIS — Z9181 History of falling: Secondary | ICD-10-CM

## 2018-04-05 NOTE — Nursing Note (Signed)
Online order for thumb spica velcro wrist brace.  Thumb spica velcro wrist brace has been dispensed.   Applied on patient's wrist.   Reviewed with the patient proper use of brace and also provided them with the written instruction it comes with.   The patient will follow up as instructed by MD.    Anastasia Fiedler  Sr. Ortho Tech       DME products dispensed at today's visit are supplied and billed by [facility].  Patient product agreement form signed and completed by the patient today.

## 2018-04-05 NOTE — Nursing Note (Signed)
Patient roomed, identifiers x2, allergies, tobacco use and medications reviewed.  Vitals obtained.  Pain at level 2/10.  Location of pain left hand/wrist. Pain medications none taken.  Blair Dolphin, LVN

## 2018-04-05 NOTE — Nursing Note (Signed)
Per physician request  [place] removed from LUE. Patient sent to XR. No incident. Foster Simpson, Ortho Tech

## 2018-04-05 NOTE — Procedures (Signed)
Audiologic  Evaluation    Paula Daugherty, a 72 year-old female with history of bilateral otosclerosis, stapedectomies and chronic left tympanic membrane perforation, was seen today for a periodic audiologic evaluation.  The last test took place 09-12-13 and revealed mild to profound mixed (essentially sensorineural) hearing loss in the right ear and moderate to mild to severe mixed hearing loss in the left ear.  She believed that her hearing had declined since that time.  She has hearing aids (from [employer]) that she does not use as they only "increase the background noise."    Audiogram:  Pure tone air and bone conduction testing revealed mild to slight to profound sensorineural hearing loss in the right ear and moderate to mild to severe mixed hearing loss in the left ear.  15-35 dB air bone gaps were noted at 4 of 5 test frequencies in the left ear.  Word Recognition Scores were excellent in the right ear and good (80%) in the left ear.  There has been no significant change in hearing in the right ear with 0-10 dB drop in the left ear since the last test on 09-12-13.    Impedance:  Tympanometry revealed a type A tympanogram which is consistent with normal tympanic membrane mobility and middle ear pressure in the right ear and revealed a type B tympanogram with a high ear canal volume which suggests tympanic membrane perforation in the left ear.    Recommendations:  1) F/u with ENT as sched. 2) Audio status post (if any) ENT treatment. 3) Return to [facility] for hearing aid related care.  Patient was briefly counseled regarding realistic expectations with hearing aid use as well as the importance of avoiding auditory deprivation.    Audiogram to be scanned into EMR    Report Electronically Signed By  Sampson Si, Au.D., [place] Audiologist  ENT  [phone]

## 2018-04-06 NOTE — Progress Notes (Signed)
PATIENT:  Zarria, Towell  MR #:  [id number]  DOB:  1945-07-10  SEX:  Female  AGE:  83yr SERVICE DATE:  04/05/2018    ORTHOPAEDICS CLINIC NOTE    Chief complaint: follow up Left hand/wrist pain    DOI/date of onset-03/17/2018    SUBJECTIVE:  NKristia Jupiteris a 89yrld, right-hand dominant female who is here with her husband for follow up evaluation of left hand and wrist pain, possible scaphoid fracture.    Injury occurred when she stumbled over a curb at a parking lot landed on her knees and her outstretched left hand.    She had quite a bit of discomfort to the wrist and hand immediately.    She was seen the same day by her PCP and x-rays showed no obvious fracture of the wrist or metacarpals but she did have slight abnormality at the IP joint of the thumb and also was noted to have CM[place] Valley[place]thritis.    She was placed into a thumb spica splint.    We saw her initially on 03/22/2018.  At that time she was complaining of ulnar-sided and dorsal wrist pain, as well as discomfort is to the thenar eminence and over the radial wrist.     Given her x-rays appeared to be negative, we placed her into a short arm thumb spica cast and she reports that she is doing better overall.    On coming out of the cast today she still has some discomfort along the radial aspect of the wrist a little bit to the base of the thenar eminence.   No complaints of numbness or tingling into the fingers.    No complaints of difficulty with motion of the fingers of the thumb.  No complaint of mid or proximal forearm pain or elbow pain.,     BP 150/77 (SITE: right arm, Orthostatic Position: sitting, Cuff Size: regular)   Pulse 72   Temp 36.6 C (97.9 F) (Temporal)   Ht 1.575 m (5' 2" )   Wt 79.7 kg (175 lb 11.3 oz)   LMP 05/06/1983   SpO2 96%   BMI 32.14 kg/m     Exam this pleasant female in no obvious discomfort at rest.      Left wrist shows no obvious bony deformities, swelling, ecchymosis or skin defects.  She is nontender over the ulnar  styloid, the ECU, the dorsal wrist, or the distal radial-ulnar joint.    She is mildly tender to the distal pole of the scaphoid (volar thenar eminence) and mildly tender over the anatomic snuffbox.    Nontender over the Piso- triquetral joint or the FCU    Wrist flexion 45 extension 60.    She has mild discomfort with circumduction grind test of the thumb CMC.    She has intact EPL and FPL and has full flexion extension of all the digits.  She is nontender about the IP joint or the MCP and is stable to radial and ulnar stressing of both joints.  Sensation is intact to the fingers and she has good capillary refill to all the digits.    Imaging-plain films of the left wrist and hand done today were reviewed personally, compared to the prior films and reviewed with the radiologist..      There is a faint lucency at the distal pole of the scaphoid and on one projection there may be a slight irregularity to the distal articular surface i.e. a slight step-off but radiology favored this  is just normal architectural surface.    No carpal row fractures or abnormalities are seen other than the arthritis at the thumb [facility] joint.    IMPRESSION:  Ongoing volar left radial wrist pain after a stumble and fall 03/17/2018, greatest area of tenderness is at the distal pole of the scaphoid.    PLAN:  I have discussed  today's x-rays with the patient.  The best way to find out if she has an actual scaphoid fracture at this point is to obtain a CT scan.    Patient agrees with this, as they are leaving on a trip December 18.    We ordered this and put her into a thumb spica splint.    She will get the CT done later this afternoon and I will review it tomorrow.    CT review done AM of 04/06/2018 reviewed w/Dr. Lanny Cramp.   we have called her and left message to come back in for thumb spica cast.  She will come in and about 10:00 on Wednesday for recasting.    We will then see her back when she returns from her upcoming trip.  She is  planning on being back in town late on 05/10/2018     At that time we will get x-rays of the right wrist out of the cast.      Patient states understanding agreement with plan.      Shirlean Kelly, PA-C/Dr. Carma Lair  Physician Assistant  Orthopaedic Surgery

## 2018-04-07 ENCOUNTER — Ambulatory Visit: Payer: Medicare Other | Attending: Physician Assistant

## 2018-04-07 DIAGNOSIS — Z4789 Encounter for other orthopedic aftercare: Secondary | ICD-10-CM | POA: Insufficient documentation

## 2018-04-08 NOTE — Progress Notes (Signed)
Online order for short arm thumb spica cast.  SATSC applied with good results.   Proper cast care instructions reviewed with the patient on keeping cast clean and dry.  The patient will follow up as instructed by MD.   Deago Burruss  Sr. Ortho Tech

## 2018-04-12 ENCOUNTER — Encounter: Payer: Self-pay | Admitting: "Endocrinology

## 2018-04-12 NOTE — Telephone Encounter (Signed)
From: [name]  To: Laury Deep Malamug  Sent: 04/12/2018 2:15 PM PST  Subject: Non-urgent Medical Advice Question    Hi Dr. I have an upper endoscopy scheduled for next Tuesday. Dr. Virgel Bouquet office asked that I contact you regarding my diabetic meds. My procedure is at 10:00. How would you like me to proceed in taking my meds.   Thank you for your guidance.

## 2018-04-18 NOTE — Procedures (Addendum)
Pre-Procedure Time Out Checklist  (do not remove)     Attestation:  ID verified by two sources (select any two from list): MRN, DOB and Name  Was this an emergency procedure?  no     I attest that I verified the following information prior to performing the procedure: Patient ID and Procedure     Patient: Paula Daugherty  Medical record number: [id number]  Sex: female  Age: 62yr Date of birth: 910-25-1947     Procedure: Flexible Transoral Upper Endoscopy  Sub-procedure: Biopsy       Date of Service: 04/20/2018  Sedation time start: 1152  Time start: 1204  Time end: 1214    Referring Physician: DJeral Pinch N.P.    PERFORMING SURGEON: EGlyn Ade M.D.    ASSISTANT SURGEON: RYolanda Bonine M.D.    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 Risks of perforation, hemorrhage, adverse drug reaction, pain, infection and aspiration were discussed. The patient was placed in the left lateral decubitus position. Based on the pre-procedure assessment, including review of the patient's medical history, medications, allergies, and review of systems, she had been deemed to be an appropriate candidate for conscious sedation; she was therefore sedated with the medications listed below. The patient was monitored continuously with EKG tracing, pulse oximetry, blood pressure monitoring, and direct observations.     An oral examination was performed. The Olympus endoscope was inserted into the mouth and advanced under direct vision to the second portion of the duodenum.   A careful inspection was made as the gastroscope was withdrawn, including a retroflexed view of the proximal stomach; findings and interventions are described below. Appropriate photo documentation was obtained. The patient tolerated the procedure well, and there were no complications. She was taken to the  recovery area in stable condition.     Sedation:   Sedation was administered for single procedure/procedures  Versed 4 mg IV and Fentanyl 100 mcg IV   Lidocaine gargle 543m   Findings and Interventions:   Esophageal mucosa: in the mid-esophagus there was note of prominent veins. The distal esophagus appeared normal. The squamocolumnar junction was noted at 36 cm.  Gastric mucosa: there was areas of patchy antral erythema, forceps biopsies of mid gastric body and antrum were obtained. There were also multiple subcentimeter fundic gland polyps left undistrubed.  Duodenal mucosa: there was mild duodenitis of the duodenal bulb. The prior snare polypectomy site in the second portion of the duodenum was identified with previously placed tattoo. There was a subcentimeter nodule noted at this site, which was removed with forceps polypectomy.  Retroflexed view: normal    Impression:   No clear recurrence of small bowel neuroendocrine tumor, there was a subcentimeter nodule in the second portion of the duodenum near the prior tattoo site and this was removed with forceps polypectomy. Mild gastritis s/p forceps biopsy of mid gastric body and antrum to evaluate for pylori.    Recommendation/Plan:   - Follow up pathology  - Repeat EGD surveillance for NET in 1 year        RuYolanda BonineM.D.  GI Fellow PGY-4  Pager #[phone]  Attending attestation: I was present for the entire viewing portion of the procedure.  I agree with the findings and plan of care as documented in the fellow's note.  Georgeanne Nim MD, MPH  Assistant Professor of Internal Medicine  Division of Gastroenterology and Hepatology

## 2018-04-18 NOTE — Pre Sedation (Addendum)
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    ROS: 10 point ROS was obtained and is negative except as mentioned in the HPI.    All available medical, surgical, personal/social history was reviewed.     Physical exam:   General Appearance: alert, no distress, pleasant affect, cooperative.  Eyes: conjunctivae and corneas clear. sclerae normal.  Mouth: normal.  Heart: normal rate and regular rhythm.  Lungs: clear to auscultation.  Abdomen: BS normal. Abdomen soft, non-tender.  Extremities:  no edema.     Labs/imaging:   [facility] reviewed: Refer to computer database in the electronic medical record.   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 Proceed with EGD.       Pre-Sedation/Anesthesia Assessment:    Patient is a candidate for moderate sedation.   Patient is ASA status: 2 - Mild, controlled systemic disease and no functional limitations    Airway Assessment:    Mallampati class 3  ROM normal      The procedure, risks, benefits, and alternatives were explained.  All patient questions were answered.  The informed consent was signed, and will be scanned into the computer database at a later date.     Patient barriers to learning: none     Patient/family understanding: verbalizes       RuYolanda BonineM.D.  GI Fellow PGY-4  Pager #[phone]  Attending  attestation: I saw and evaluated the patient.  I agree with the findings and the plan of care as documented in the fellow's note.    ErGeorgeanne NimD, MPH  Assistant Professor of Internal Medicine  Division of Gastroenterology and Hepatology

## 2018-04-19 ENCOUNTER — Telehealth: Payer: Self-pay | Admitting: GASTROENTEROLOGY

## 2018-04-19 ENCOUNTER — Encounter: Payer: Self-pay | Admitting: Family Medicine

## 2018-04-19 ENCOUNTER — Ambulatory Visit: Payer: Medicare Other | Attending: "Endocrinology

## 2018-04-19 DIAGNOSIS — Z794 Long term (current) use of insulin: Secondary | ICD-10-CM | POA: Insufficient documentation

## 2018-04-19 DIAGNOSIS — E1129 Type 2 diabetes mellitus with other diabetic kidney complication: Secondary | ICD-10-CM

## 2018-04-19 DIAGNOSIS — E1121 Type 2 diabetes mellitus with diabetic nephropathy: Secondary | ICD-10-CM | POA: Insufficient documentation

## 2018-04-19 DIAGNOSIS — E119 Type 2 diabetes mellitus without complications: Secondary | ICD-10-CM

## 2018-04-19 LAB — BASIC METABOLIC PANEL
Calcium: 9.2 mg/dL (ref 8.6–10.5)
Carbon Dioxide Total: 25 mmol/L (ref 24–32)
Chloride: 104 mmol/L (ref 95–110)
Creatinine Serum: 0.96 mg/dL (ref 0.44–1.27)
E-GFR Creatinine (Female): 60 mL/min/{1.73_m2}
E-GFR, African American (Female): 69 mL/min/{1.73_m2}
Glucose: 104 mg/dL — ABNORMAL HIGH (ref 70–99)
Potassium: 4 mmol/L (ref 3.3–5.0)
Sodium: 141 mmol/L (ref 135–145)
Urea Nitrogen, Blood (BUN): 21 mg/dL (ref 8–22)

## 2018-04-19 LAB — MICROALBUMIN
Creatinine Spot Urine: 103.66 mg/dL
Microalbumin Urine: 36.5 mg/dL
Microalbumin/Creatinine Ratio: 352 mg/g — ABNORMAL HIGH (ref <30)

## 2018-04-19 LAB — CREATININE SPOT URINE: Creatinine Spot Urine: 103.66 mg/dL

## 2018-04-19 NOTE — Telephone Encounter (Signed)
From: [name]  To: [name], MD  Sent: 04/16/2018 7:58 PM PST  Subject: Preventive Care    Just wanted to share DJ and I got our flu shots as directed. Now I'm sick. Thanks doc. Merry Christmas. See you next year.

## 2018-04-19 NOTE — Telephone Encounter (Signed)
Hello,    This is Malu from [place].  I'm calling to remind you of your upcoming procedure with Dr.Chak tomorrow , Dec 17 at [facility] should have received your instructions and Prep/Medication prescribed by your provider.  Please read and follow the [facility] Instructions 5 days prior to your procedure.      For any questions or concerns, please call [phone].    Thank you,    [facility] RN

## 2018-04-20 ENCOUNTER — Ambulatory Visit
Admission: RE | Admit: 2018-04-20 | Discharge: 2018-04-20 | Disposition: A | Discharge status: Home / Self Care | Payer: Medicare Other | Source: Ambulatory Visit | Attending: Hospitalist | Admitting: Hospitalist

## 2018-04-20 ENCOUNTER — Encounter: Payer: Self-pay | Admitting: GASTROENTEROLOGY

## 2018-04-20 DIAGNOSIS — K2950 Unspecified chronic gastritis without bleeding: Secondary | ICD-10-CM | POA: Insufficient documentation

## 2018-04-20 DIAGNOSIS — K2960 Other gastritis without bleeding: Secondary | ICD-10-CM | POA: Insufficient documentation

## 2018-04-20 DIAGNOSIS — Z8719 Personal history of other diseases of the digestive system: Secondary | ICD-10-CM | POA: Insufficient documentation

## 2018-04-20 DIAGNOSIS — K3189 Other diseases of stomach and duodenum: Secondary | ICD-10-CM

## 2018-04-20 DIAGNOSIS — D3A00 Benign carcinoid tumor of unspecified site: Secondary | ICD-10-CM | POA: Insufficient documentation

## 2018-04-20 HISTORY — PX: PR ESOPHAGOGASTRODUODENOSCOPY TRANSORAL DIAGNOSTIC: 43235

## 2018-04-20 LAB — POC GLUCOSE: POC GLUCOSE: 133 mg/dL — AB (ref 70–99)

## 2018-04-20 MED ORDER — EPINEPHRINE 0.1 MG/ML INJECTION SYRINGE
0.3000 mg | INJECTION | INTRAMUSCULAR | Status: DC | PRN
Start: 2018-04-20 — End: 2018-04-26

## 2018-04-20 MED ORDER — LACTATED RINGERS IV INFUSION
INTRAVENOUS | Status: DC
Start: 2018-04-20 — End: 2018-04-26

## 2018-04-20 MED ORDER — SIMETHICONE 40 MG/0.6 ML ORAL DROPS,SUSPENSION
40.0000 mg | Freq: Once | ORAL | Status: AC
Start: 2018-04-20 — End: 2018-04-20
  Administered 2018-04-20: 40 mg
  Filled 2018-04-20: qty 0.6, fill #0

## 2018-04-20 MED ORDER — FLUMAZENIL 0.1 MG/ML INTRAVENOUS SOLUTION
0.2000 mg | INTRAVENOUS | Status: DC | PRN
Start: 2018-04-20 — End: 2018-04-26

## 2018-04-20 MED ORDER — DIPHENHYDRAMINE 50 MG/ML INJECTION SOLUTION
12.5000 mg | INTRAMUSCULAR | Status: DC | PRN
Start: 2018-04-20 — End: 2018-04-26
  Filled 2018-04-20: qty 1, fill #0

## 2018-04-20 MED ORDER — FENTANYL (PF) 50 MCG/ML INJECTION SOLUTION
12.5000 ug | INTRAMUSCULAR | Status: DC | PRN
Start: 2018-04-20 — End: 2018-04-26
  Administered 2018-04-20: 100 ug via INTRAVENOUS
  Filled 2018-04-20: qty 4, fill #0

## 2018-04-20 MED ORDER — NALOXONE 0.4 MG/ML INJECTION SOLUTION
0.4000 mg | INTRAMUSCULAR | Status: DC | PRN
Start: 2018-04-20 — End: 2018-04-26

## 2018-04-20 MED ORDER — EPINEPHRINE 0.1 MG/ML INJECTION SYRINGE
1.0000 mg | INJECTION | INTRAMUSCULAR | Status: DC | PRN
Start: 2018-04-20 — End: 2018-04-26

## 2018-04-20 MED ORDER — MIDAZOLAM (PF) 1 MG/ML INJECTION SOLUTION
0.5000 mg | INTRAMUSCULAR | Status: DC | PRN
Start: 2018-04-20 — End: 2018-04-26
  Administered 2018-04-20: 4 mg via INTRAVENOUS
  Filled 2018-04-20: qty 10, fill #0

## 2018-04-20 MED ORDER — ATROPINE 0.1 MG/ML INJECTION SYRINGE
0.5000 mg | INJECTION | INTRAMUSCULAR | Status: DC | PRN
Start: 2018-04-20 — End: 2018-04-26

## 2018-04-20 MED ORDER — EPINEPHRINE 0.1 MG/ML INJECTION SYRINGE
0.3000 mg | INJECTION | INTRAMUSCULAR | Status: DC | PRN
Start: 2018-04-20 — End: 2018-04-26
  Filled 2018-04-20: qty 10, fill #0

## 2018-04-20 MED ORDER — LIDOCAINE HCL 2 % MUCOSAL SOLUTION
5.0000 mL | Freq: Once | Status: AC
Start: 2018-04-20 — End: 2018-04-20
  Administered 2018-04-20: 5 mL via OROMUCOSAL
  Filled 2018-04-20: qty 15, fill #0

## 2018-04-20 NOTE — Nurse Discharge Note (Signed)
Marland KitchenAmount of time spent educating patient: 20 minutes    Education                 Title: GI Endoscopy (Adult) (Done)     Provider References:   [facility] Endo [email or web address].pdf: [email or web address]     Topic: When to Seek Medical Attention (Done)     Point: Sleepiness, Persistent (Done)     Description:   Provider (specify) should be called if:       continued sedation effects the next day    symptoms do not resolve    Immediate medical care should be sought if:    difficult to arouse/change in level of consciousness    symptoms become worse           Patient Friendly Description:   Call the doctor if:           you have a hard time waking up the next day        symptoms do not get better    Learning Progress Summary           Patient Acceptance, E, VU by MB at 04/20/2018 1053                   Point: Nausea and Vomiting (Done)     Description:   Provider (specify) should be called if:       unable to tolerate food, drink or medication    loss of appetite    symptoms do not resolve    Immediate medical care should be sought if:    forceful vomiting    blood in stool or vomit    symptoms become worse           Patient Friendly Description:   Do not force yourself to eat, but try to sip some clear liquids.           Call the doctor if:        you can't keep food or drink down without getting sick        you don't have an appetite        you do not feel better        Get medical care right away if:        you see blood in your poop (stool) or vomit        you feel worse    Learning Progress Summary           Patient Acceptance, E, VU by MB at 04/20/2018 1053                         Topic: Gastrointestinal Endoscopy Overview (Done)     Point: Procedure Review (Done)     Description:   Gastrointestinal (GI) endoscopy is a procedure to examine the lining of the esophagus, stomach, upper small intestine, colon or rectum.       A flexible, lighted fiber optic scope  is used.    Review anticipated anesthesia type.    Post-procedure care will include:    monitoring during sedation recovery    IV fluid    gradual diet advancement           Patient Friendly Description:   A gastrointestinal (GI) endoscopy is a long, flexible tube.           It looks at the lining of parts of your digestive system.  It is put down the throat or in your bottom.        A light and a tiny camera is at the end of the scope. Other special tools can be used through a hole in the scope.        Medicine will help make you sleepy and comfortable.    Learning Progress Summary           Patient Acceptance, E, VU by MB at 04/20/2018 1053                   Point: Indications (Done)     Description:   Indications for a GI endoscopy may include:       biopsy    difficulty swallowing    gastric or esophageal ulcer    GI bleeding    unexplained anemia    persistent heartburn    persistent vomiting    persistent reflux symptoms despite therapy    cancer screening    abnormal radiologic finding    foreign body removal    diagnostic for inflammatory bowel disease    Focus education on patient-specific indication.           Patient Friendly Description:   There are many reasons to have this procedure.           The doctor will talk to you about why it is important for you.        Please ask questions if you do not understand.    Learning Progress Summary           Patient Acceptance, E, VU by MB at 04/20/2018 1053                         Topic: Self-Management (Done)     Point: Fluid/Food Intake (Done)     Description:   Encourage advancing diet gradually once swallowing has returned to normal and patient is alert.              Learning Progress Summary           Patient Acceptance, E, VU by MB at 04/20/2018 1053                   Point: Activity (Done)     Description:   Review post-discharge activity restrictions which may include:       driving    work/school    Someone should be with the patient to make sure  he/she is safe until fully recovered from sedation.           Patient Friendly Description:   Follow the doctor's instructions for activity.           Rest for today.  Do not try to do too much.        There may be limits set for:        work, school        playing sports, exercise        driving as applicable        use of tools or appliances        signing of legal papers        Someone should stay with you until you are safe or have completely recovered from sedation.    Learning Progress Summary           Patient Acceptance, E, VU by MB at 04/20/2018 1053  Point: Resuming Home Medication (Done)     Description:   Resume regular medications as directed by provider after procedure.       Patient may need to hold anticoagulants, sedatives and narcotics for a specified amount of time.           Learning Progress Summary           Patient Acceptance, E, VU by MB at 04/20/2018 1053                   Point: Promote Flatus Passing (Done)     Description:   Encourage gas expulsion and alternating positions to facilitate.              Patient Friendly Description:   Air was used to help see better during the procedure.           Extra air can give your child/you stomach pain after the procedure.        It will be important to pass gas and burp the extra air out. Walking and changing position helps move air up or down.        Right side-lying and fetal position (knees to the chest) might help with comfort.    Learning Progress Summary           Patient Acceptance, E, VU by MB at 04/20/2018 1053                               User Key     Initials Effective Dates Name Provider Type Discipline    MB 03/08/18 Rita Ohara, RN .NURSE: (RN or LVN) nurse

## 2018-04-20 NOTE — Discharge Instructions (Signed)
Discharge Instructions for upper endoscopy    Findings:  Mild nodularity in duodenum, biopsies taken.  There was also inflammation in the stomach and biopsies were taken.    Activity:    Rest today, resume usual activitiy tomorrow     Diet:    Resume usual diet:     Medication:    Resume all usual medications and Avoid Gastrotoxic Drugs (Aspirin, Motrin, Aleve, etc.)    Follow up:   Call primary care physician for follow-up appointment and We will notify you of your biopsy results via phone call or letter.If you do not receive notice of your results by three weeks, please call us at the phone number provided below.      During your procedure, air was pumped into your GI tract so your doctor could see clearly to make a diagnosis and/or treat your problem.     Some possible side effects you may experience are:   - Discomfort due to a distended (bloated) abdomen which will subside after a few hours to two days.   - Nausea may be a side effect of the medication and will subside.   - The medications you received may make you dizzy and sleepy, it is important that you do not drive, operate machinery or drink alcohol for at least one day.   - Severe pain is not expected and should be reported    Other side effects may include:  Upper Endoscopy: A sore throat can be helped by cough drops, or a salt-water gargle.    If problems, call [facility] [phone] during business hours Monday through Friday 7:30am to 4:30 pm.  After hours call [phone] and ask to speak to the GI Fellow on call.

## 2018-04-20 NOTE — Nurse Focus (Signed)
NURSING PRE-SEDATION ASSESSMENT    Paula Daugherty arrived by ambulating into clinic today at 1000 from home.      Patient has arranged for a ride home from DJ, husband who can be contacted at [phone]. Instructed patient that post sedation, they are not to drive or drink alcohol, sign any important/legal documents or make any important decisions for the remainder of the day and patient verbalized understanding.      Identification band placed on and two forms of identification information verified: yes    Verified procedure with the patient. yes    NPO since: 0630 water; 2000 solids    Pt completed bowel prep:  Not applicable      Past Surgical History:   Procedure Laterality Date    BIOFEEDBACK PERI/URO/RECTAL  10/21/2017    session #1: N Score = 5.4    BIOFEEDBACK PERI/URO/RECTAL  11/04/2017    session #2    BIOFEEDBACK PERI/URO/RECTAL  11/18/2017    Session #3    BIOFEEDBACK PERI/URO/RECTAL  12/02/2017    Session #4    BIOFEEDBACK PERI/URO/RECTAL  12/30/2017    Session #5    BIOFEEDBACK PERI/URO/RECTAL  03/23/2018    Session #6    CAPSULE ENDOSCOPY  01/27/2018    CAPSULE ENDOSCOPY  01/29/2018         COLONOSCOPY  01/04/14    polyp--TA and erosion; hemorrhoids; tics; repeat x 3 yrs    COLONOSCOPY  12/16/2017    TA; repeat in 3 yrs    EGD      HC COLONOSCOPY,REMV LESN,SNARE  12/16/2017    MAMMOPLASTY, REDUCTION  1985    MANOMETRY  09/02/2017    work on constipation; kegels and biofeedback; proceed w/colonoscopy     PHACOEMULSIFICATION, CATARACT Right 02/12/2015    with IOL    PR ESOPHAGOGASTRODUODENOSCOPY TRANSORAL DIAGNOSTIC  02/12/2016    EGD--polyp--path pending; antral gastritis; no varices     PR ESOPHAGOGASTRODUODENOSCOPY TRANSORAL DIAGNOSTIC  06/11/2016    EGD--biopsy at site of carcinoid tumor path pending     PR ESOPHAGOGASTRODUODENOSCOPY TRANSORAL DIAGNOSTIC      EGD--folds in duodenum--path pending     PR ESOPHAGOGASTRODUODENOSCOPY TRANSORAL DIAGNOSTIC  04/2017    EGD; repeat EGD in 1  year     PR KNEE SCOPE,DIAGNOSTIC  1980s    Knee arthroscopy    PR LIGATE FALLOPIAN TUBE      Tubal ligation    PR REPAIR TYMPANIC MEMBRANE      Tympanoplasty    PR TOTAL ABDOM HYSTERECTOMY  1980s    partial; no BSO    REPAIR, BLEPHAROPTOSIS Bilateral 08/06/2015    Blepharoplasty bilateral upper lids       Social History     Tobacco Use    Smoking status: Former Smoker     Packs/day: 0.10     Years: 20.00     Pack years: 2.00    Smokeless tobacco: Never Used    Tobacco comment: quit 30 years ago   Substance Use Topics    Alcohol use: Yes     Alcohol/week: 0.0 standard drinks     Comment: rare shares a beer or glass wine every 2-3 months        Social History     Substance and Sexual Activity   Drug Use No        Past anesthesia responses:  No problem.      Current Outpatient Medications   Medication Sig    Amlodipine (NORVASC) 10 mg Tablet Take 1  tablet by mouth every day.    Atorvastatin (LIPITOR) 10 mg Tablet Take 1 tablet by mouth every day at bedtime.    Blood Glucose Meter (ONETOUCH ULTRA2) Kit 1 kit one time.    Blood Sugar Diagnostic (ONETOUCH ULTRA TEST) Strips Use to test blood glucose 3x/day    Clindamycin (CLEOCIN-T) 1 % Gel Apply to sores in the scalp twice daily as needed with flaring.    empagliflozin (JARDIANCE) 25 mg Tablet Take 1 tablet by mouth every day.    FLUTICASONE/SALMETEROL (ADVAIR DISKUS INHA) Take  by inhalation.    GlipiZIDE (GLUCOTROL XL) 10 mg SR Tablet Take 2 tablets by mouth every morning with a meal. (diabetes)    Insulin Glargine (LANTUS SOLOSTAR U-100 INSULIN) 100 unit/mL (3 mL) Pen Inject 26 units subcutaneously every night at bedtime.    linaclotide (LINZESS) 145 mcg Capsule Take 1 capsule by mouth every day.    Losartan (COZAAR) 25 mg Tablet Take 1 tablet by mouth every day.    Magnesium Citrate Liquid Drink contents of bottle followed by at least 2 cups of water. Do this around 12 pm 2 days prior to colonoscopy.    Metformin (GLUCOPHAGE) 1,000 mg tablet  Take 1 tablet by mouth 2 times daily with meals. (diabetes)    Metoclopramide (REGLAN) 5 mg Tablet Take 1 tablet by mouth 4 times daily. 30 minutes before each meal    Omeprazole (PRILOSEC) 20 mg Delayed Release Capsule TAKE 1 CAPSULE BY MOUTH TWICE DAILY BEFORE MEALS    Ondansetron (ZOFRAN) 8 mg Tablet Take 1 tablet by mouth every 8 hours if needed.    Venlafaxine ([name]) 75 mg Tablet TAKE 2 TABLETS BY MOUTH EVERY MORNING AND TAKE 1 TABLET BY MOUTH EVERY EVENING     Current Facility-Administered Medications   Medication    Atropine Injection 0.5 mg    DiphenhydrAMINE (BENADRYL) Injection 12.5-25 mg    Epinephrine (ADRENALIN) 0.1 mg/mL Injection 0.3-1 mg    Epinephrine (ADRENALIN) 0.1 mg/mL Injection 0.3-1 mg    Epinephrine (ADRENALIN) 0.1 mg/mL Injection 1 mg    Fentanyl (SUBLIMAZE) Injection 12.5-75 mcg    Flumazenil (ROMAZICON) Injection 0.2 mg    Lactated Ringers Infusion    Lidocaine (XYLOCAINE) 2 % Viscous Solution 5 mL    Midazolam (VERSED) Injection 0.5-3 mg    Naloxone (NARCAN) Injection 0.4 mg    Simethicone (MYLICON) 40 BH/4.1 mL Oral Suspension 40-80 mg       Anticoagulants: Patient denies taking Aspirin, NSAIDs, or other anticoagulants for the past 5 days.    OTC/Herbal preparations: none    Reviewed health status with the patient in regards to allergies (including latex), medications, pregnancy, hypertension, atrial fibrilation, liver disease, bleeding disorders, diabetes,  CVA's, seizures, sleep apnea, glaucoma, cancer, prosthesis or implants, infectious diseases, surgical history; and disease of heart, lungs, liver, or kidneys. Positive history reviewed and updated in Health History section of the EMR.    Patient Active Problem List   Diagnosis    DM2 (diabetes mellitus, type 2) ([place])    Depression with anxiety    Stress at home    Leg cramps-right calf    Right ear pain    Fibromyalgia    HTN (hypertension)    GERD (gastroesophageal reflux disease)    Hyperlipidemia with  target LDL less than 70    Vitamin D insufficiency    Abnormal LFTs    Renal cyst ( 6.4 cm cyst left kidney)    Cough    Fatty  liver    Macroalbuminuric diabetic nephropathy (HCC)    History of actinic keratoses    Cataract    Ptosis of eyelid    Liver fibrosis (HCC)    Adrenal nodule (HCC)    Medication Therapy Auth    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[place])    Mixed conductive and sensorineural hearing loss of both ears    Neoplasm of uncertain behavior of skin    Abdominal pain, generalized    Nausea without vomiting    Asymptomatic microscopic hematuria    Subcutaneous nodule    Angina pectoris (HCC)       Allergies   Allergen Reactions    Contrast Dye [Radiopaque Agent] Hives    Hydrochlorothiazide Other-Reaction in Comments     Patient reports    Januvia [Sitagliptin] Other-Reaction in Comments     Patient reports    Lyrica [Pregabalin] Other-Reaction in Comments     Patient reports    Omnipaque [Iohexol] Other-Reaction in Comments     Patient reports    Prozac [Fluoxetine Hcl] Other-Reaction in Comments     Patient reports    Sulfa (Sulfonamide Antibiotics) Rash    Topiramate Other-Reaction in Comments     Hairfall       Past Medical History:   Diagnosis Date    Angina pectoris ([place]) 09/23/2017    Anxiety     Asthma     Cirrhosis ([place])     Constipation, chronic     Depression     Diabetes mellitus ([place])     Fatty liver     Hypertension     Kidney disease 2015    cyst left kidney per pt    Liver fibrosis ([place])     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/20/2016    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  Circulation/Perfusion/Skin: warm,normal color,dry.  Cognition/Communication -- Behavior: alert and oriented,cooperative.  Gastro-Intestinal: no distress.  Abdomen: soft,non-tender.  Level of Ambulation: self.    Belongings are with  patient in garment bag under gurney.  Patient has: Glasses.  Barriers to Learning assessed: none. Patient verbalizes understanding of teaching and instructions.    Nursing assessment completed by:  Danley Danker, RN  04/20/2018 10:40

## 2018-04-21 ENCOUNTER — Encounter: Payer: Self-pay | Admitting: Family Medicine

## 2018-04-21 LAB — HEMOGLOBIN A1C
Hgb A1C,Glucose Est Avg: 157 mg/dL
Hgb A1C: 7.1 % — ABNORMAL HIGH (ref 3.9–5.6)

## 2018-04-22 ENCOUNTER — Encounter: Payer: Self-pay | Admitting: GASTROENTEROLOGY

## 2018-04-22 LAB — SURGICAL PATHOLOGY

## 2018-04-23 NOTE — Progress Notes (Signed)
I examined and evaluated the patient and developed a care plan with the resident.  I reviewed and agree with the findings, assessment, and plan as summarized in the resident's note above.     Electronically signed by:    Mariane Burpee C. Cindie Rajagopalan, MD FACS  Professor  Otology, Neurotology, and Skull Base Surgery  Department of Otolaryngology - Head and Neck Surgery  [facility]  PI#:  [id number]  Pager:  [phone]

## 2018-04-30 ENCOUNTER — Other Ambulatory Visit: Payer: Self-pay | Admitting: Physician Assistant

## 2018-04-30 DIAGNOSIS — R52 Pain, unspecified: Secondary | ICD-10-CM

## 2018-05-02 HISTORY — PX: HC BIOFEEDBACK PERI/URO/RECTAL: 917000001

## 2018-05-02 NOTE — Procedures (Signed)
Date of Service: 03/23/18    Patient: Paula Daugherty  LOCATION: [facility]  MR #: [id number]  SEX: female  AGE: 64yr DOB: 9Nov 11, 1947   OPERATION RECORD    PROCEDURE: Pelvic Floor neuromuscular retraining session    PRIMARY CARE PROVIDER:   PCP: JJamelle Haring MD    REFERRING PHYSICIAN:   JJamelle Haring MD     ATTENDING PHYSICIAN:   ARoss Marcus MD.  I was present for the interpretation.     PREOPERATIVE DIAGNOSES:   Constipation  Weak sphincter tone    POSTOPERATIVE DIAGNOSES:  Successful completion of session 6 of neuromuscular retraining session.    CONSENT:  Risks, benefits and alternatives to a pelvic floor retraining session have been discussed with the patient including, but not limited to, the risk of pain, bleeding. A timeout was performed by nursing and medical staff to identify the patient (using two patient identifiers) and appropriate procedure. The patient is agreeable to proceed and did not have any questions.    PROCEDURE:  The study was performed at the [facility]. The patient was monitored throughout the entire procedure. An electromyography ( EMG) study of the pelvic musculature was performed with an interval sensor monitoring the levator ani muscles and surface sensors monitoring the abdominals (or other accesory muscles). The sensor is introduced into the rectum. The patient is educated regarding , brething, relaxation techniques as well how to perform proper contractions and relaxation maneuvers. This procedure first evaluate the ability of the phasic ( fast twitch) muscles to make quick contractions on demand. Next the tonic (slow twitch) and phasic EMG levator ani muscle activity is measured during the relax and contract sequences. The abdominal muscles are monitored to ensure the isolation of the levator ani muscles during this evaluation.  The results of the procedure appears below. The N Score or net score is the mean microvolt value during contraction sequences  less the mean microvolt value during the resting sequences.  An increase in the N score will correlate to improvement of levator ani muscle function as well as reduced incidence of incontinence episodes.    Activity         Mean uV values  Kegel Contract        14.6  Kegel relax   8.6  N-score   6.0    Estimated blood loss: none    Complications: none    Specimens: none    The N score is improved from baseline.    RECOMMENDATION:  follow up in GI clinic    ARoss Marcus MD

## 2018-05-11 ENCOUNTER — Ambulatory Visit
Admission: RE | Admit: 2018-05-11 | Discharge: 2018-05-11 | Disposition: A | Discharge status: Home / Self Care | Payer: Medicare Other | Source: Ambulatory Visit | Attending: Physician Assistant | Admitting: Physician Assistant

## 2018-05-11 ENCOUNTER — Ambulatory Visit (HOSPITAL_BASED_OUTPATIENT_CLINIC_OR_DEPARTMENT_OTHER): Payer: Medicare Other | Admitting: Physician Assistant

## 2018-05-11 DIAGNOSIS — S62015D Nondisplaced fracture of distal pole of navicular [scaphoid] bone of left wrist, subsequent encounter for fracture with routine healing: Secondary | ICD-10-CM

## 2018-05-11 DIAGNOSIS — S62012D Displaced fracture of distal pole of navicular [scaphoid] bone of left wrist, subsequent encounter for fracture with routine healing: Secondary | ICD-10-CM

## 2018-05-11 NOTE — Progress Notes (Signed)
Chief Complaint   Patient presents with    Diabetes     BACKGROUND:  From last Endocrinology Clinic appt for T2DM and adrenal nodule with Dr. Rosary Lively in July 2019:    "She was initially diagnosed with diabetes around 2000 on routine labs.  She was started on Metformin around 2005 and then she was started on insulin in 2014. She was up to 64 units of Lantus at night. However, after making changes to her diet and lifestyle, she was taken off insulin in 2015.Basal insulin was added back to her regimen in 2018.    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    Current Regimen:  Jardiance 25 mg daily  Glipizide 20 mg daily  Lantus 22 units QHS  Metformin 1000 mg BID    She also has a history of an adrenal nodule. Hormonal evaluation was normal in 01/2016 and she will be due for repeat hormonal evaluation in7/2019.     DIABETES HISTORY  (-) h/o retinopathy.   (-) h/o microalbuminuria/nephropathy.  (-) h/o neuropathy.  (-) h/o Autonomic dysfunction  (-) h/o Gastroparesis  (-) h/o CAD  (-) h/o PVD  Last eye exam:06/2017, f/u 1year  Last urine microalbumin:03/2017,124  Pneumovax: 12/2014, Prevnar 2018  Influenza vaccine:01/2017  (+) ASA  (+) Statin  (+) ACE/ARB      Adrenal nodule  - Biochemical evaluation normalin 11/2016, stability by imaging in 04/2017  - Repeathormonal evaluationdue in7/2019(~1 year from previous) for 5 years total (2022)    HISTORY OF PRESENT ILLNESS:   Paula Daugherty is a 56yryear old female who is here for the following reason:  Follow up for T2DM and adrenal nodule    Due to elevated fasting glucose levels, increased Lantus to 26 units qHS at last appt.    Current medication regimen:   Lantus 26 units QHS (provided today with self titration instructions)  Jardiance 25 mg daily  Glipizide 20 mg daily  Metformin 1000 mg BID    Brought glucometer today.  There are only 16 recorded glucose levels in the  past 30 days.  Review of download of glucometer data from the past 30 days showed an avg glucose of 149 +/- 25.  Denies frequent hypoglycemia.    Diet:   Eats 2 meals per day, 1 snack a day    Exercise:   None    Last eye exam: Feb 2019, reminded to schedule appt  Last foot exam: July 2019  Last A1c:   Lab Results   Component Value Date    HGBA1C 7.1 (H) 04/19/2018     Last microalbumin: Dec 2019  Last lipid panel: Aug 2019  Last vit B12: Aug 2019  Vaccinations: Up to date for PNA and influenza   Pt on ACE-inhibitor/ARB: Losartan  Pt on Statin: Lipitor  Pt on ASA: No  A1c goal: < 7.0%    With regard to h/o adrenal nodule, she had annual labs done in July 2019.  Denies episodic HA, sweating, chest pain/palpitations.      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      REVIEW OF SYSTEMS:   Constitutional: No fevers or unexplained weight changes   Eyes: No vision changes or eye pain   ENT: No throat pain   CV: No chest pain or palpitations   Resp: No  dyspnea or cough   GI: No abdominal pain, n/v/d, hematochezia or melena   GU: No polyuria, dysuria, or hematuria   MSK: No muscle pain or weakness   Integumentary: No rash   Neuro: No HA, weakness, paresthesias     Past Medical History:   Diagnosis Date    Angina pectoris ([place]) 09/23/2017    Anxiety     Asthma     Cirrhosis ([place])     Constipation, chronic     Depression     Diabetes mellitus ([place])     Fatty liver     Hypertension     Kidney disease 2015    cyst left kidney per pt    Liver fibrosis ([place])     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/20/2016    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    Psychiatric illness        Past Surgical History:   Procedure Laterality Date    BIOFEEDBACK PERI/URO/RECTAL  10/21/2017    session #1: N Score = 5.4    BIOFEEDBACK PERI/URO/RECTAL  11/04/2017    session #2    BIOFEEDBACK  PERI/URO/RECTAL  11/18/2017    Session #3    BIOFEEDBACK PERI/URO/RECTAL  12/02/2017    Session #4    BIOFEEDBACK PERI/URO/RECTAL  12/30/2017    Session #5    BIOFEEDBACK PERI/URO/RECTAL  03/23/2018    Session #6    CAPSULE ENDOSCOPY  01/27/2018    CAPSULE ENDOSCOPY  01/29/2018         COLONOSCOPY  01/04/14    polyp--TA and erosion; hemorrhoids; tics; repeat x 3 yrs    COLONOSCOPY  12/16/2017    TA; repeat in 3 yrs    EGD      HC BIOFEEDBACK PERI/URO/RECTAL  05/02/2018         HC COLONOSCOPY,REMV LESN,SNARE  12/16/2017    MAMMOPLASTY, REDUCTION  1985    MANOMETRY  09/02/2017    work on constipation; kegels and biofeedback; proceed w/colonoscopy     PHACOEMULSIFICATION, CATARACT Right 02/12/2015    with IOL    PR ESOPHAGOGASTRODUODENOSCOPY TRANSORAL DIAGNOSTIC  02/12/2016    EGD--polyp--path pending; antral gastritis; no varices     PR ESOPHAGOGASTRODUODENOSCOPY TRANSORAL DIAGNOSTIC  06/11/2016    EGD--biopsy at site of carcinoid tumor path pending     PR ESOPHAGOGASTRODUODENOSCOPY TRANSORAL DIAGNOSTIC      EGD--folds in duodenum--path pending     PR ESOPHAGOGASTRODUODENOSCOPY TRANSORAL DIAGNOSTIC  04/2017    EGD; repeat EGD in 1 year     PR ESOPHAGOGASTRODUODENOSCOPY TRANSORAL DIAGNOSTIC  04/20/2018    gastritis; nodule--path pending    PR KNEE SCOPE,DIAGNOSTIC  1980s    Knee arthroscopy    PR LIGATE FALLOPIAN TUBE      Tubal ligation    PR REPAIR TYMPANIC MEMBRANE      Tympanoplasty    PR TOTAL ABDOM HYSTERECTOMY  1980s    partial; no BSO    REPAIR, BLEPHAROPTOSIS Bilateral 08/06/2015    Blepharoplasty bilateral upper lids       Patient Active Problem List   Diagnosis    DM2 (diabetes mellitus, type 2) ([place])    Depression with anxiety    Stress at home    Leg cramps-right calf    Right ear pain    Fibromyalgia    HTN (hypertension)    GERD (gastroesophageal reflux disease)    Hyperlipidemia with target LDL less than 70    Vitamin D insufficiency    Abnormal LFTs  Renal cyst ( 6.4  cm cyst left kidney)    Cough    Fatty liver    Macroalbuminuric diabetic nephropathy (HCC)    History of actinic keratoses    Cataract    Ptosis of eyelid    Liver fibrosis (HCC)    Adrenal nodule (HCC)    Medication Therapy Auth    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HCC)    Mixed conductive and sensorineural hearing loss of both ears    Neoplasm of uncertain behavior of skin    Abdominal pain, generalized    Nausea without vomiting    Asymptomatic microscopic hematuria    Subcutaneous nodule    Angina pectoris (HCC)       Current Outpatient Medications   Medication Sig Dispense Refill    Amlodipine (NORVASC) 10 mg Tablet Take 1 tablet by mouth every day. 90 tablet 3    Atorvastatin (LIPITOR) 10 mg Tablet Take 1 tablet by mouth every day at bedtime. 90 tablet 1    Blood Glucose Meter (ONETOUCH ULTRA2) Kit 1 kit one time. 1 kit 0    Blood Sugar Diagnostic (ONETOUCH ULTRA TEST) Strips Use to test blood glucose 3x/day 300 strip 11    empagliflozin (JARDIANCE) 25 mg Tablet Take 1 tablet by mouth every day. 90 tablet 3    GlipiZIDE (GLUCOTROL XL) 10 mg SR Tablet Take 2 tablets by mouth every morning with a meal. (diabetes) 180 tablet 3    Insulin Glargine (LANTUS SOLOSTAR U-100 INSULIN) 100 unit/mL (3 mL) Pen Inject 26 units subcutaneously every night at bedtime. 8 pen 3    Losartan (COZAAR) 25 mg Tablet Take 1 tablet by mouth every day. 90 tablet 3    Metformin (GLUCOPHAGE) 1,000 mg tablet Take 1 tablet by mouth 2 times daily with meals. (diabetes) 180 tablet 3    Omeprazole (PRILOSEC) 20 mg Delayed Release Capsule TAKE 1 CAPSULE BY MOUTH TWICE DAILY BEFORE MEALS 180 capsule 1    Venlafaxine ([name]) 75 mg Tablet TAKE 2 TABLETS BY MOUTH EVERY MORNING AND TAKE 1 TABLET BY MOUTH EVERY EVENING 270 tablet 1     No current facility-administered medications for this visit.        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    Tobacco Use    Smoking status: Former Smoker     Packs/day: 0.10     Years: 20.00     Pack years: 2.00    Smokeless tobacco: Never Used    Tobacco comment: quit 30 years ago   Substance and Sexual Activity    Alcohol use: Yes     Alcohol/week: 0.0 standard drinks     Comment: rare    Drug use: No    Sexual activity: Yes     Partners: Male        Family History   Problem Relation Name Age of Onset    Diabetes Father      Heart Mother          MI at 48s     Non-contributory Brother      Non-contributory Brother         BP (!) 139/92 (  SITE: left arm, Orthostatic Position: sitting, Cuff Size: regular)   Pulse 73   Temp 36.3 C (97.4 F) (Temporal)   Wt 78.2 kg (172 lb 6.4 oz)   LMP 05/06/1983   SpO2 95%   BMI 31.53 kg/m     PHYSICAL EXAM:   Vital signs: Reviewed  General appearance: Well-appearing, alert, conversing appropriately   Head: Normocephalic   Eyes: EOM intact, no scleral icterus   Neck: No visible thyromegaly   Neuro: Normal gait   Foot Exam: Last done in July 2019  Psych: Normal affect and speech   Skin: No diffuse rash     LAB, RADIOLOGY, AND OTHER STUDY RESULTS:   I personally reviewed the following results:     Component      Latest Ref Rng & Units 04/19/2018 04/19/2018 04/19/2018          12:17 PM 12:17 PM 10:58 AM   SODIUM      135 - 145 mmol/L      POTASSIUM      3.3 - 5.0 mmol/L      CHLORIDE      95 - 110 mmol/L      CARBON DIOXIDE TOTAL      24 - 32 mmol/L      UREA NITROGEN, BLOOD (BUN)      8 - 22 mg/dL      CREATININE BLOOD      0.44 - 1.27 mg/dL      GLUCOSE      70 - 99 mg/dL      CALCIUM      8.6 - 10.5 mg/dL      E-GFR, African American (Female)      >60 See Note mL/min/1.42m2      E-GFR, Non-African American (Female)      >60 See Note mL/min/1.737m      CHOLESTEROL      0 - 200 mg/dL      HDL  CHOLESTEROL      >=35 mg/dL      LDL CHOLESTEROL CALCULATION      <130 mg/dL      TOTAL CHOLESTEROL:HDL RATIO      <4.0      TRIGLYCERIDE      35 - 160 mg/dL      NON-HDL CHOLESTEROL      <=150 mg/dL      MICROALBUMIN URINE      mg/dL  36.5    CREATININE SPOT URINE      mg/dL 103.66 103.66    MICROALBUMIN/CREATININE RATIO      <30 mg/g  352 (H)    HGB A1C      3.9 - 5.6 %   7.1 (H)   HGB A1C,GLUCOSE EST AVG      mg/dL   157   VITAMIN B12      213 - 816 pg/mL        Component      Latest Ref Rng & Units 04/19/2018 12/23/2017 12/23/2017          10:58 AM  9:23 AM  9:23 AM   SODIUM      135 - 145 mmol/L 141     POTASSIUM      3.3 - 5.0 mmol/L 4.0     CHLORIDE      95 - 110 mmol/L 104     CARBON DIOXIDE TOTAL      24 - 32 mmol/L 25     UREA NITROGEN, BLOOD (BUN)  8 - 22 mg/dL 21     CREATININE BLOOD      0.44 - 1.27 mg/dL 0.96     GLUCOSE      70 - 99 mg/dL 104 (H)     CALCIUM      8.6 - 10.5 mg/dL 9.2     E-GFR, African American (Female)      >60 See Note mL/min/1.15m2 623    E-GFR, Non-African American (Female)      >60 See Note mL/min/1.756m 60     CHOLESTEROL      0 - 200 mg/dL  161    HDL CHOLESTEROL      >=35 mg/dL  61    LDL CHOLESTEROL CALCULATION      <130 mg/dL  71    TOTAL CHOLESTEROL:HDL RATIO      <4.0  2.6    TRIGLYCERIDE      35 - 160 mg/dL  145    NON-HDL CHOLESTEROL      <=150 mg/dL  100    MICROALBUMIN URINE      mg/dL      CREATININE SPOT URINE      mg/dL      MICROALBUMIN/CREATININE RATIO      <30 mg/g      HGB A1C      3.9 - 5.6 %      HGB A1C,GLUCOSE EST AVG      mg/dL      VITAMIN B12      213 - 816 pg/mL   359     Component      Latest Ref Rng & Units 10/19/2017           9:38 AM   SODIUM      135 - 145 mmol/L    POTASSIUM      3.3 - 5.0 mmol/L    CHLORIDE      95 - 110 mmol/L    CARBON DIOXIDE TOTAL      24 - 32 mmol/L    UREA NITROGEN, BLOOD (BUN)      8 - 22 mg/dL    CREATININE BLOOD      0.44 - 1.27 mg/dL    GLUCOSE      70 - 99 mg/dL    CALCIUM      8.6 - 10.5 mg/dL    E-GFR, African  American (Female)      >60 See Note mL/min/1.7337m   E-GFR, Non-African American (Female)      >60 See Note mL/min/1.62m68m  CHOLESTEROL      0 - 200 mg/dL    HDL CHOLESTEROL      >=35 mg/dL    LDL CHOLESTEROL CALCULATION      <130 mg/dL    TOTAL CHOLESTEROL:HDL RATIO      <4.0    TRIGLYCERIDE      35 - 160 mg/dL    NON-HDL CHOLESTEROL      <=150 mg/dL    MICROALBUMIN URINE      mg/dL    CREATININE SPOT URINE      mg/dL    MICROALBUMIN/CREATININE RATIO      <30 mg/g    HGB A1C      3.9 - 5.6 % 6.8 (H)   HGB A1C,GLUCOSE EST AVG      mg/dL 148   VITAMIN B12      213 - 816 pg/mL      CORTISOL,URINE FREE   Order: [id number]  Collected:  12/23/2017 07:45 Status:  Final result Visible to patient:  Yes (MyChart) Dx:  Adrenal nodule (HCC)    Ref Range & Units    TOTAL VOLUME mL 1750    TIME OF COLLECTION hr 24    CORTISOL,UR FREE PER VOLUME ug/L 19.50    CORTISOL,UR FREE PER 24HR <=45.0 ug/d 34.1    CORTISOL,UR FREE RATIO TO CRT ug/g CRT 31.97            CATECHOLAMINES,URINE FREE   Order: [id number]   Collected:  12/23/2017 07:45 Status:  Final result Visible to patient:  Yes (MyChart) Dx:  Adrenal nodule (HCC)    Ref Range & Units    TOTAL VOLUME mL 1750    TIME OF COLLECTION hr 24    DOPAMINE,UR PER VOLUME ug/L 111    DOPAMINE,UR PER 24HR 56 - 272 ug/d 194    Comment: REFERENCE INTERVAL: Dopamine, Urine - ug/d     Access complete set of age- and/or gender-specific reference   intervals for this test in the [facility] Test Directory   ([email or web address].pt).   DOPAMINE,UR RATIO TO CRT 0 - 250 ug/g CRT 182    NOREPINEPHRINE,UR PER VOLUME ug/L 33    NOREPINEPHRINE,UR PER 24HR 11 - 60 ug/d 58    Comment: REFERENCE INTERVAL: Norepinephrine, Urine - ug/d     Access complete set of age- and/or gender-specific reference   intervals for this test in the [facility] Test Directory   ([email or web address]pt).   NOREPINEPHRINE,UR RATIO TO CRT 0 - 45 ug/g CRT 54High     EPINEPHRINE,UR PER VOLUME ug/L 1    EPINEPHRINE,UR PER 24 HR 1 - 14  ug/d 2    Comment: REFERENCE INTERVAL: Epinephrine, Urine - ug/d     Access complete set of age- and/or gender-specific reference   intervals for this test in the [facility] Test Directory   ([email or web address]pt).   EPINEPHRINE,UR RATIO TO CRT 0 - 20 ug/g CRT 2            METANEPHRINES FRACTIONATED,UR   Order: [id number]   Collected:  12/23/2017 07:45 Status:  Final result Visible to patient:  Yes (MyChart) Dx:  Adrenal nodule (HCC)    Ref Range & Units    TOTAL VOLUME mL 1750    TIME OF COLLECTION hr 24    METANEPHRINE,UR-PER VOLUME ug/L 65    METANEPHRINE,UR-PER 24HR 39 - 143 ug/d 114    METANEPHRINE,UR-RATIO TO CRT 0 - 300 ug/g CRT 108    NORMETANEPHRINE,UR-PER VOLUME ug/L 265    NORMETANEPHRINE,UR-PER 24HR 109 - 393 ug/d 464High     NORMETANEPHRIN,UR-RATIO TO CRT 0 - 400 ug/g CRT 442High             CT ABDOMEN + PELVIS UROGRAM  EXAM DATE: 04/08/2017 5:21 PM  COMPARISON: 12/03/2016  IMPRESSION:  1. No cause for hematuria. No urolithiasis, renal mass, or  uroepithelial mass in the kidneys, ureters, or urinary bladder.  2. No hydronephrosis or hydroureter.  3. Unchanged 1.8 cm left adrenal adenoma.    ASSESSMENT AND PLAN:     (E11.29,  Z79.4) Type 2 diabetes mellitus with other diabetic kidney complication, with long-term current use of insulin ([place])   I reviewed most recent lab results and glucometer data with patient. A1c just slightly above goal.  Due to elevated fasting glucose levels, will increase Lantus to 28 units qHS.  Provided patient with verbal and written instructions on Lantus self titration.  GFR remains > 45.  Continue  Jardiance, glipizide, and  metformin. Pt has been counseled not to skip/delay meals.  Counseled to check glucose level twice a day. She has been provided with verbal and written instructions on management of hypoglycemia.     Last eye exam: Feb 2019, reminded to schedule appt  Last foot exam: July 2019  Last A1c:   Lab Results   Component Value Date    HGBA1C 7.1 (H) 04/19/2018     Last  microalbumin: Dec 2019  Last lipid panel: Aug 2019  Last vit B12: Aug 2019  Vaccinations: Up to date for PNA and influenza   Pt on ACE-inhibitor/ARB: Losartan  Pt on Statin: Lipitor  Pt on ASA: No  A1c goal: < 7.0%    (I10) Essential hypertension  BP slightly elevated above goal of less than 130/80, instructed to monitor at home.      (E78.2) Mixed hyperlipidemia  LDL less than 100 on Lipitor.    (E27.9) Adrenal nodule ([place])  According to prior notes, biochemical evaluation was normal in 01/2016 and 11/2016.  Found to have stability by imaging in 04/2017.  Repeathormonal evaluation in7/2019 was normal (none of 24 hr urine lab results were > 2x the ULN).  Continue annual biochemical screening until 2022 (for total of 5 years).    Follow up in 3 months with labs prior.    Patient verbalizes understanding of teaching and instructions. ?The patient and/or caregiver was educated regarding his/her medical care. I reviewed past medical, family/social and medication history during the visit.     Approximately 40 minutes of face-to-face time was spent with patient and >50% of this time was spent counseling/educating on diabetes, albuminuria, and HTN, and answering patient's questions.     Electronically signed by:    Daryl Eastern, MD  Associate Physician  Endocrinology  [facility], [place]

## 2018-05-11 NOTE — Progress Notes (Signed)
PATIENT: Paula Daugherty, Paula Daugherty  MR #: [id number]  DOB: 1945/11/12  SEX: Female AGE: 70yr SERVICE DATE: 05/11/2018    Paula Daugherty   Chief complaint:follow up Left distal pole scaphoid fx    DOI/date of onset-03/17/2018    SUBJECTIVE:  Paula Daugherty a 89yrld,right-hand dominant female whois here with her husband for follow up evaluation of left hand and wrist pain, possible scaphoid fracture.    Injury occurred when she stumbled over a curb at a parking lot landed on her knees and her outstretched left hand.  She had quite a bit of discomfort to the wrist and hand immediately.    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   She was placed into a thumb spica splint.    We saw her initially on 03/22/2018.  It took CT on 04/05/2018 to confirm distal pole fracture of scaphoid.  At that time she was complaining of ulnar-sided and dorsal wrist pain, as well as discomfort is to the thenar eminence and over the radial wrist.     She has been in a short arm thumb spica cast, now 8 weeks (tomorrow) post injury.    On coming out of the cast today she still has some discomfort along the radial aspect of the wrist a little bit to the base of the thenar eminence.  No complaints of numbness or tingling into the fingers.   No complaints of difficulty with motion of the fingers of the thumb.  No complaint of mid or proximal forearm pain or elbow pain.,     LMP 05/06/1983     Exam this pleasant female in no obvious discomfort at rest.      Left wrist shows no obvious bony deformities, swelling, ecchymosis or skin defects.  She is nontender over the ulnar styloid, the ECU, the dorsal wrist, or the distal radial-ulnar joint.    She is mildly tender to the distal pole of the scaphoid (volar thenar eminence) and mildly tender over the anatomic  snuffbox.    Nontender over the Piso-triquetral joint or the FCU    Wrist flexion 45 extension 60.    She has mild discomfort with circumduction grind test of the thumb CMC.   She has intact EPL and FPL and has full flexion extension of all the digits.  She is nontender about the IP joint or the MCP and is stable to radial and ulnar stressing of both joints.  Sensation is intact to the fingers and she has good capillary refill to all the digits.    Imaging-plain films of the left wrist done today were reviewed personally, compared to the prior films and reviewed with the radiologist..     There is a faint lucency at the distal pole of the scaphoid and on one projection there is a slight step-off.   S-L angle is ~65-66 degrees    No carpal row fractures or abnormalities are seen other than the arthritis at the thumb [facility].    IMPRESSION:  Distal pole of the scaphoid fracture, now 8 weeks out.    PLAN:  I have discussed today's x-rays with the patient.  Per prior discussion with Dr. BaLanny Daugherty transition back to her thumb spica brace (she has one at home).. Marland Kitchen gentle range of  motion, no weight bearing on left wrist, torquing, heavy lifting, etc  Follow up plain films left wrist w/scaphoid view in 4-5 weeks  Patient states understanding agreement with plan.      Paula Kelly, PA-C/Paula Daugherty  Physician Assistant  Orthopaedic Surgery

## 2018-05-12 ENCOUNTER — Encounter: Payer: Self-pay | Admitting: "Endocrinology

## 2018-05-12 ENCOUNTER — Ambulatory Visit: Payer: Medicare Other | Admitting: "Endocrinology

## 2018-05-12 ENCOUNTER — Encounter: Payer: Self-pay | Admitting: Family Medicine

## 2018-05-12 ENCOUNTER — Ambulatory Visit (INDEPENDENT_AMBULATORY_CARE_PROVIDER_SITE_OTHER): Payer: Medicare Other | Admitting: Family Medicine

## 2018-05-12 VITALS — BP 128/85 | HR 76 | Temp 97.5°F | Resp 14 | Wt 172.8 lb

## 2018-05-12 VITALS — BP 139/92 | HR 73 | Temp 97.4°F | Wt 172.4 lb

## 2018-05-12 DIAGNOSIS — Z794 Long term (current) use of insulin: Secondary | ICD-10-CM

## 2018-05-12 DIAGNOSIS — I10 Essential (primary) hypertension: Secondary | ICD-10-CM

## 2018-05-12 DIAGNOSIS — E1129 Type 2 diabetes mellitus with other diabetic kidney complication: Secondary | ICD-10-CM

## 2018-05-12 DIAGNOSIS — L081 Erythrasma: Secondary | ICD-10-CM

## 2018-05-12 DIAGNOSIS — C7A8 Other malignant neuroendocrine tumors: Secondary | ICD-10-CM

## 2018-05-12 DIAGNOSIS — E782 Mixed hyperlipidemia: Secondary | ICD-10-CM

## 2018-05-12 DIAGNOSIS — F330 Major depressive disorder, recurrent, mild: Secondary | ICD-10-CM | POA: Insufficient documentation

## 2018-05-12 DIAGNOSIS — E279 Disorder of adrenal gland, unspecified: Secondary | ICD-10-CM

## 2018-05-12 DIAGNOSIS — I209 Angina pectoris, unspecified: Secondary | ICD-10-CM

## 2018-05-12 DIAGNOSIS — E278 Other specified disorders of adrenal gland: Secondary | ICD-10-CM

## 2018-05-12 MED ORDER — LOSARTAN 25 MG TABLET
25.0000 mg | ORAL_TABLET | Freq: Every day | ORAL | 3 refills | Status: DC
Start: 2018-05-12 — End: 2019-06-01

## 2018-05-12 MED ORDER — LANCING DEVICE WITH LANCETS KIT
PACK | 0 refills | Status: AC
Start: 2018-05-12 — End: 2019-05-13

## 2018-05-12 MED ORDER — BLOOD SUGAR DIAGNOSTIC STRIPS: ORAL_STRIP | 3 refills | Status: AC

## 2018-05-12 MED ORDER — CLINDAMYCIN 1 % TOPICAL GEL
Freq: Two times a day (BID) | TOPICAL | 0 refills | Status: DC
Start: 2018-05-12 — End: 2019-01-25

## 2018-05-12 MED ORDER — AMLODIPINE 10 MG TABLET
10.0000 mg | ORAL_TABLET | Freq: Every day | ORAL | 3 refills | Status: DC
Start: 2018-05-12 — End: 2019-06-21

## 2018-05-12 NOTE — Nursing Note (Signed)
Per physician request  Convoy removed from LUE. Patient sent to XR. No incident. Foster Simpson, Ortho Tech'

## 2018-05-12 NOTE — Patient Instructions (Addendum)
Please get non-fasting lab done in April approximately 1 week prior to your follow up appointment.       LANTUS INSULIN ADJUSTMENT    Inject Lantus (glargine) insulin every day at bedtime.  Your starting dose is 28 units of Lantus.    Check your fasting sugar level every morning. You will adjust your Lantus (glargine) insulin to normalize your morning fasting sugar level.      If your fasting morning sugar level remains higher than 120 for 3 days in a row, increase your Lantus (glargine) insulin dose by 2 units.  Continue to increase your dose by 2 units as needed every 3 days until your morning blood sugar level is less than 120.    If you experience unexplained low sugars (below 70) at anytime of the day, do NOT increase your Lantus (glargine) dose that day.    If you have morning sugar levels that are less than 80 for 2 days in a row, decrease your Lantus (glargine) insulin dose by 2 units.

## 2018-05-12 NOTE — Patient Instructions (Signed)
Please see about possibly getting Shinrix from outside pharmacy--repeat due after 2 mos with 1 booster to complete series of 2.

## 2018-05-12 NOTE — Progress Notes (Signed)
Paula Daugherty is a 34yrfemale who presents with a chief complaint of "I need to talk about female stuff".    Chief Complaint   Patient presents with    Blood Pressure       1. Redness under belly; pannus with discoloration; associated moisture in the area with recent travelling; onset x 2 wks   2. HTN: She reports .taking medications as instructed, no medication side effects noted, no TIA's, no chest pain on exertion, no dyspnea on exertion, no swelling of ankles; blood pressure reads: none; elevated at endocrine dur to rushing to get there   3. Stressors: [place]; daughter does not talk to her and unable to see grandson; spouse reported to be less attentive lately but a good husband per patient; no SI or HI; previously seen by counselor; hopes to focus on her own health and start to get active and see how to make her mother's issues less of focus     ROS:   .Constitutional: no fever/chills.  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 90 tablet 3    Atorvastatin (LIPITOR) 10 mg Tablet Take 1 tablet by mouth every day at bedtime. 90 tablet 1    Blood Glucose Meter (ONETOUCH ULTRA2) Kit 1 kit one time. 1 kit 0    Blood Sugar Diagnostic (ONETOUCH ULTRA TEST) Strips Use to test blood glucose 2 times a day 200 strip 3    empagliflozin (JARDIANCE) 25  mg Tablet Take 1 tablet by mouth every day. 90 tablet 3    GlipiZIDE (GLUCOTROL XL) 10 mg SR Tablet Take 2 tablets by mouth every morning with a meal. (diabetes) 180 tablet 3    Insulin Glargine (LANTUS SOLOSTAR U-100 INSULIN) 100 unit/mL (3 mL) Pen Inject 26 units subcutaneously every night at bedtime. 8 pen 3    Lancing Device with Lancets (ONE TOUCH DELICA) Kit Check glucose level twice a day 1 kit 0    Losartan (COZAAR) 25 mg Tablet Take 1 tablet by mouth every day. 90 tablet 3    Metformin (GLUCOPHAGE) 1,000 mg tablet Take 1 tablet by mouth 2 times daily with meals. (diabetes) 180 tablet 3    Omeprazole (PRILOSEC) 20 mg Delayed Release Capsule TAKE 1 CAPSULE BY MOUTH TWICE DAILY BEFORE MEALS 180 capsule 1    Venlafaxine ([name]) 75 mg Tablet TAKE 2 TABLETS BY MOUTH EVERY MORNING AND TAKE 1 TABLET BY MOUTH EVERY EVENING 270 tablet 1     No current facility-administered medications on file prior to visit.      Allergies:   Allergies   Allergen  Reactions    Contrast Dye [Radiopaque Agent] Hives    Hydrochlorothiazide Other-Reaction in Comments     Patient reports    Januvia [Sitagliptin] Other-Reaction in Comments     Patient reports    Lyrica [Pregabalin] Other-Reaction in Comments     Patient reports    Omnipaque [Iohexol] Other-Reaction in Comments     Patient reports    Prozac [Fluoxetine Hcl] Other-Reaction in Comments     Patient reports    Sulfa (Sulfonamide Antibiotics) Rash    Topiramate Other-Reaction in Comments     Hairfall       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 [employer]    Social Needs    Financial resource strain: Not on file    Food insecurity:     Worry: Not on file     Inability: Not on file    Transportation needs:     Medical: Not on file      Non-medical: Not on file   Tobacco Use    Smoking status: Former Smoker     Packs/day: 0.10     Years: 20.00     Pack years: 2.00    Smokeless tobacco: Never Used    Tobacco comment: quit 30 years ago   Substance and Sexual Activity    Alcohol use: Yes     Alcohol/week: 0.0 standard drinks     Comment: rare    Drug use: No    Sexual activity: Yes     Partners: Male   Lifestyle    Physical activity:     Days per week: Not on file     Minutes per session: Not on file    Stress: Not on file   Relationships    Social connections:     Talks on phone: Not on file     Gets together: Not on file     Attends religious service: Not on file     Active member of club or organization: Not on file     Attends meetings of clubs or organizations: Not on file     Relationship status: Not on file    Intimate partner violence:     Fear of current or ex partner: Not on file     Emotionally abused: Not on file     Physically abused: Not on file     Forced sexual activity: Not on file   Other Topics Concern    Not on file   Social History Narrative    Not on file     Family History   Problem Relation Name Age of Onset    Diabetes Father      Heart Mother          MI at 38s     Non-contributory Brother      Non-contributory Brother         PE:  BP 128/85 (SITE: right arm, Orthostatic Position: sitting, Cuff Size: large)   Pulse 76   Temp 36.4 C (97.5 F) (Temporal)   Resp 14   Wt 78.4 kg (172 lb 13.5 oz)   LMP 05/06/1983   BMI 31.61 kg/m   .General Appearance:  healthy, alert, no distress, pleasant affect, cooperative.  Heart:  normal rate and regular rhythm, no murmurs, clicks, or gallops.  Lungs: clear to auscultation.  Extremities:  no cyanosis, clubbing, or edema.  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 No guarantees were made regarding her medical care or treatment outcome. Barriers to Learning: none.  Patient verbalizes understanding of teaching and instructions.    Electronically signed by:    [name], MD  [place], [place] Board of [employer] Medicine  Associate physician [facility], [place]   [phone]

## 2018-05-12 NOTE — Nursing Note (Signed)
Vital signs taken, allergies verified, screened for pain, tobacco hx verified.     Paizlie Klaus, MA

## 2018-05-12 NOTE — Nursing Note (Signed)
Patient roomed, chief complaint noted, allergies verified, blood pressure, pulse, respiration, and weight obtained, screened for pain, and pharmacy verified.  Stony Stegmann, M.A.

## 2018-05-18 ENCOUNTER — Encounter: Payer: Self-pay | Admitting: Family Medicine

## 2018-06-01 ENCOUNTER — Encounter: Payer: Self-pay | Admitting: "Endocrinology

## 2018-06-01 DIAGNOSIS — Z794 Long term (current) use of insulin: Principal | ICD-10-CM

## 2018-06-01 DIAGNOSIS — E1129 Type 2 diabetes mellitus with other diabetic kidney complication: Secondary | ICD-10-CM

## 2018-06-01 NOTE — Telephone Encounter (Signed)
From: [name]  To: Valarie Cones, MD  Sent: 05/31/2018 9:37 PM PST  Subject: Visit Follow-up Question    Hello Dr., I was at my pharmacy today. I was sharing with them the new dosage plan you have me on. They requested that you send them a new Rx for the new dosage plan otherwise my insurance may not cover the increase usage. I am up to 36 now soon to be 38. I am not sure 120 is reasonable goal for me. My numbers have been really high for the most part. Maybe shooting for 150 would be a better place to start. Your thoughts please. Thank you for your help.     Paula Daugherty

## 2018-06-01 NOTE — Telephone Encounter (Signed)
Refill request was received from pharmacy.    Patient was last seen on 05/12/2018. her next upcoming appointment is scheduled on 08/23/2018.

## 2018-06-02 MED ORDER — INSULIN GLARGINE (U-100) 100 UNIT/ML (3 ML) SUBCUTANEOUS PEN: PEN_INJECTOR | SUBCUTANEOUS | 3 refills | Status: DC

## 2018-06-02 NOTE — Addendum Note (Signed)
Addended by: Milderd Meager ROSE on: 06/02/2018 10:51 PM     Modules accepted: Orders

## 2018-06-04 ENCOUNTER — Other Ambulatory Visit: Payer: Self-pay | Admitting: Physician Assistant

## 2018-06-04 DIAGNOSIS — M25532 Pain in left wrist: Secondary | ICD-10-CM

## 2018-06-14 ENCOUNTER — Encounter: Payer: Self-pay | Admitting: Physician Assistant

## 2018-06-14 ENCOUNTER — Ambulatory Visit (HOSPITAL_BASED_OUTPATIENT_CLINIC_OR_DEPARTMENT_OTHER): Payer: Medicare Other | Admitting: Physician Assistant

## 2018-06-14 ENCOUNTER — Ambulatory Visit
Admission: RE | Admit: 2018-06-14 | Discharge: 2018-06-14 | Disposition: A | Discharge status: Home / Self Care | Payer: Medicare Other | Source: Ambulatory Visit | Attending: Physician Assistant | Admitting: Physician Assistant

## 2018-06-14 VITALS — BP 146/89 | HR 74 | Temp 97.8°F | Ht 62.0 in | Wt 177.2 lb

## 2018-06-14 DIAGNOSIS — S62012D Displaced fracture of distal pole of navicular [scaphoid] bone of left wrist, subsequent encounter for fracture with routine healing: Secondary | ICD-10-CM

## 2018-06-14 DIAGNOSIS — M25532 Pain in left wrist: Secondary | ICD-10-CM

## 2018-06-14 DIAGNOSIS — S62015D Nondisplaced fracture of distal pole of navicular [scaphoid] bone of left wrist, subsequent encounter for fracture with routine healing: Secondary | ICD-10-CM

## 2018-06-14 NOTE — Progress Notes (Signed)
PATIENT: Paula Daugherty, Paula Daugherty  MR #: [id number]  DOB: [birth year]  SEX: Female AGE: 18yr SERVICE DATE: 06/14/2018    [facility]   Chief complaint:follow upLeft distal pole scaphoid fx    DOI/date of onset-03/17/2018, now approximately 3 months post injury    SUBJECTIVE:  NRamani Rivais a 89yrld,right-hand dominant female whois here with her husband forfollow upevaluation of left distal pole scaphoid fracture.    Injury occurred when shestumbled over a curb at a parking lot landed on her knees and her outstretched left hand.  She had quite a bit of discomfort to the wrist and hand immediately.    She was seen the same day by her PCP and x-rays showed no obvious fracture of the wrist or metacarpals but she did have slight abnormality at the IP joint of the thumb and also was noted to have CMSequoyahrthritis.   She was placed into a thumb spica splint.    We saw her initially on 03/22/2018.  It took CT on 04/05/2018 to confirm distal pole fracture of scaphoid.  At that time shewas complaining ofulnar-sided and dorsal wrist pain,as well asdiscomfort is to the thenar eminenceand over the radial wrist.    She was a short arm thumb spica cast, until 8 weeks post injury.  She continues to report some swelling over the dorsal, ulnar aspect of the left hand.    No complaints of numbness or tingling into the fingers.  No complaints of difficulty with motion of the fingers of the thumb.  No complaint of mid or proximal forearm pain or elbow pain.,     BP 146/89 (SITE: left arm, Orthostatic Position: sitting, Cuff Size: regular)   Pulse 74   Temp 36.6 C (97.8 F) (Temporal)   Ht 1.575 m (5' 2" )   Wt 80.4 kg (177 lb 4 oz)   LMP 05/06/1983   BMI 32.42 kg/m     Exam this pleasant female in no obvious discomfort at rest.    Left wrist- shows no obvious bony deformities, ecchymosis or skin defects.    She does have an elongated area of approximately 2 cm wide and 4 cm long roughly  overlying the dorsal ring finger metacarpal.    This feels synovitic, there is no increased warmth, no erythema, no pulsatile sensation.  Please see the photos uploaded into the media section today    She is nontender over the ulnar styloid, the ECU, the dorsal wrist,orthe distal radial-ulnar joint.    She is non-tender to the distal pole of the scaphoid (volar thenar eminence) or  overthe anatomic snuffbox.  Nontender over the Piso-triquetral joint or the FCU    Wrist flexion-50; extension-60-65.    She has mild discomfort with circumduction grind test of the thumb CMC.   She has intact EPL and FPL and has full flexion extension of all the digits.  She is nontender about the IP joint or the MCP and is stable to radial and ulnar stressing of both joints.  Sensation is intact to the fingers and she has good capillary refill to all the digits.    Imaging-plain films of the left wrist donetodaywere reviewed personally by Dr. BaLanny Crampcompared to the prior films and then reviewed with the patient and her husband.  There is no evidence of a fracture at the distal pole of the scaphoid on today's films.    No carpal row fractures or abnormalities are seen, other than the arthritis at the [facility]  joint.    IMPRESSION:  Healed distal pole of the scaphoid fracture, now approximately 12 weeks out.  - Mild swelling over the dorsal left hand consistent with synovitis    PLAN:  I have reviewed the case and the x-rays with Dr. Lanny Cramp.  She can now increase range of motion, but still no weight bearing on left wrist, torquing, heavy lifting, etc    We will see her back in approximately 6 weeks to reevaluate the dorsum of the hand.  She is to return sooner if she has any redness, heat, streaking, inability to extend her fingers or other symptoms that develop.    Patient states understanding agreement with plan.      Shirlean Kelly, PA-C/Dr. Carma Lair  Physician Assistant  Orthopaedic Surgery

## 2018-06-14 NOTE — Nursing Note (Signed)
Vitals taken, patient roomed by Ashanti Ratti M.A. Medications and Allergies updated. Pain Scale 3/10

## 2018-06-30 ENCOUNTER — Ambulatory Visit: Payer: Medicare Other | Admitting: Family Medicine

## 2018-07-06 ENCOUNTER — Telehealth: Payer: Self-pay | Admitting: Physician Assistant

## 2018-08-02 ENCOUNTER — Ambulatory Visit: Payer: Medicare Other | Admitting: Physician Assistant

## 2018-08-03 ENCOUNTER — Ambulatory Visit: Payer: Self-pay | Admitting: Optometrist

## 2018-08-06 ENCOUNTER — Other Ambulatory Visit: Payer: Self-pay | Admitting: GASTROENTEROLOGY

## 2018-08-08 ENCOUNTER — Other Ambulatory Visit: Payer: Self-pay | Admitting: Family Medicine

## 2018-08-08 DIAGNOSIS — E119 Type 2 diabetes mellitus without complications: Secondary | ICD-10-CM

## 2018-08-09 ENCOUNTER — Encounter: Payer: Self-pay | Admitting: Family Medicine

## 2018-08-09 NOTE — Telephone Encounter (Signed)
From: [name]  To: [name], MD  Sent: 08/08/2018 11:23 AM PDT  Subject: Non-urgent Medical Advice Question    Hi Dr. , I don't know if you are working these days but I have a question. Could you share with me my diagnoses in regard to my liver issues please. I am working with doctors in [place] regarding my mom and any of these issues we might share. 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 Is that correct? If so what stage is it at? I did find a test that said I have "severe fibrosis". Is that the same as cirrhosis?   We are doing ok and staying home. Hope you and your family are doing well.   Thanks for your help.

## 2018-08-10 DIAGNOSIS — K7581 Nonalcoholic steatohepatitis (NASH): Secondary | ICD-10-CM | POA: Insufficient documentation

## 2018-08-10 NOTE — Telephone Encounter (Signed)
Unable to refill ondansetron 8 mg tabs per Prescription Refill by Clinic RN Standardized Policy.  Last seen in clinic June 2018.  Now being followed by [facility].  Jeral Pinch no longer seeing patients in clinic.  No future appointment with [facility].  Dr Virgel Bouquet, please refill if appropriate, or advise.  .  Thank you.  Roetta Sessions RN

## 2018-08-12 ENCOUNTER — Encounter: Payer: Self-pay | Admitting: "Endocrinology

## 2018-08-12 NOTE — Telephone Encounter (Signed)
From: [name]  To: Valarie Cones, MD  Sent: 08/11/2018 8:19 PM PDT  Subject: Visit Follow-up Question    Dr. Garth Schlatter asking if you are seeing patients in person? DJ and I both have appointments on April 20th. You wanted blood work prior to our appointment so trying to see if we should cancel or get blood work. Both are doing well and my numbers have gotten better. We are home.

## 2018-08-13 NOTE — Telephone Encounter (Signed)
Patient rescheduled to a further date.

## 2018-08-23 ENCOUNTER — Ambulatory Visit: Payer: Medicare Other | Admitting: "Endocrinology

## 2018-09-06 ENCOUNTER — Ambulatory Visit: Payer: Vision Other Private Insurance | Admitting: Optometrist

## 2018-09-14 ENCOUNTER — Other Ambulatory Visit: Payer: Self-pay | Admitting: GASTROENTEROLOGY

## 2018-09-14 ENCOUNTER — Encounter: Payer: Self-pay | Admitting: GASTROENTEROLOGY

## 2018-09-14 ENCOUNTER — Ambulatory Visit: Payer: Medicare Other | Attending: "Endocrinology

## 2018-09-14 DIAGNOSIS — Z794 Long term (current) use of insulin: Secondary | ICD-10-CM

## 2018-09-14 DIAGNOSIS — E1129 Type 2 diabetes mellitus with other diabetic kidney complication: Secondary | ICD-10-CM | POA: Insufficient documentation

## 2018-09-14 DIAGNOSIS — K3184 Gastroparesis: Secondary | ICD-10-CM

## 2018-09-14 NOTE — Telephone Encounter (Signed)
From: [name]  To: [name], MD  Sent: 09/13/2018 12:38 PM PDT  Subject: Non-urgent Medical Advice Question    [name], I hope this finds you and your family doing well. We seem ok with no virus so far. I do have a question about me though. Seems most days I get up with a very queasy stomach that is lasting several hours. I am using the Zofran daily now. Sometimes there is pain on the daytime like now but that seems to come more at or doing the night. It may be nothing but wanted to check in with you just to get your thoughts. Also is it to early to schedule my endoscopy for Dec? As always thanks for your help and support. Paula Daugherty

## 2018-09-15 LAB — HEMOGLOBIN A1C
Hgb A1C,Glucose Est Avg: 140 mg/dL
Hgb A1C: 6.5 % — ABNORMAL HIGH (ref 3.9–5.6)

## 2018-09-17 NOTE — Progress Notes (Signed)
Patient WAS wearing a surgical mask  PPE used by provider during encounter: Surgical mask    Chief Complaint   Patient presents with    Follow Up With Specialist     BACKGROUND:  From last Endocrinology Clinic appt for T2DM and adrenal nodule with Dr. Rosary Lively in July 2019:    "She was initially diagnosed with diabetes around 2000 on routine labs. She was started on Metformin around 2005 and then she was started on insulin in 2014. She was up to 64 units of Lantus at night. However, after making changes to her diet and lifestyle, she was taken off insulin in 2015.Basal insulin was added back to her regimen in 2018.    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    Current Regimen:  Jardiance 25 mg daily  Glipizide 20 mg daily  Lantus 22 units QHS  Metformin 1000 mg BID    She also has a history of an adrenal nodule. Hormonal evaluation was normal in 01/2016 and she will be due for repeat hormonal evaluation in7/2019.     Adrenal nodule  - Biochemical evaluation normalin 11/2016, stability by imaging in 04/2017  - Repeathormonal evaluationdue in7/2019(~1 year from previous) for 5 years total (2022)"    HISTORY OF PRESENT ILLNESS:   Paula Daugherty is a 10yryear old female who is here for the following reason:  Follow up for T2DM and adrenal nodule    Has been under under increased stress due to mother ill.      Has been adhering to Lantus self titration instructions provided at last appt.    Current medication regimen:  Lantus 30units QHS (has been provided with self titration instructions)  Jardiance 25 mg daily  Glipizide 20 mg daily  Metformin 1000 mg BID    Review of glucometer data from the past 30 days show avg glucose of 138 +/- 39.  Checking on avg 1 time a day, fasting in the morning.  Has had occasional hypoglycemia in the late afternoon or early evening that she states occurs when she skips meals.      Diet:    Eats231mls per day (usually skips breakfast),1 snack a day    Exercise:  Yard work    Diabetes complications:  Gastroparesis    Last eye exam:Feb 2019, has appt July 2020  Last foot exam:Will do today  Last A1c:   Lab Results   Component Value Date    HGBA1C 6.5 (H) 09/14/2018     Last microalbumin:Dec 2019  Last lipid panel:Aug 2019  Last vit B12:Aug 2019  Vaccinations: Up to date for PNA and influenza   Pt on ACE-inhibitor/ARB:Losartan  Pt on Statin:Lipitor  Pt on ASA:No  A1c goal:< 7.0%    With regard to h/o adrenal nodule, shelast hadannual labs done in July 2019.  Denies abdominal pain.  Denies episodic HA, sweating, chest pain/palpitations.     With regard to h/o duodenalcarcinoids/p removal by GI,she stated that she no longer requires follow up with Oncology but continues to follow up with GI.    REVIEW OF SYSTEMS:   Constitutional: No fevers or unexplained weight changes   Eyes: No vision changes or eye pain   ENT: No throat pain   CV: No chest pain or palpitations   Resp: No dyspnea or cough   GI: No abdominal pain, n/v/d, hematochezia or melena   GU: No polyuria, dysuria, or hematuria   MSK: No muscle pain  or weakness   Integumentary: No rash   Neuro: No HA, weakness, paresthesias    PHYSICAL EXAM:   Vital signs: Reviewed  General appearance: Well-appearing, alert, conversing appropriately   Head: Normocephalic   Eyes: EOM intact, no scleral icterus   Neck: No visible thyromegaly   Neuro: Normal gait   Foot Exam: Normal filament sensory exam, palpable pulses, no significant skin lesions  Psych: Normal affect and speech   Skin: No diffuse rash     LAB, RADIOLOGY, AND OTHER STUDY RESULTS:   I personally reviewed the following results:     Component      Latest Ref Rng & Units 09/14/2018 04/19/2018 04/19/2018           8:43 AM 12:17 PM 12:17 PM   SODIUM      135 - 145 mmol/L      POTASSIUM      3.3 - 5.0 mmol/L      CHLORIDE      95 - 110 mmol/L      CARBON DIOXIDE TOTAL      24 -  32 mmol/L      UREA NITROGEN, BLOOD (BUN)      8 - 22 mg/dL      CREATININE BLOOD      0.44 - 1.27 mg/dL      GLUCOSE      70 - 99 mg/dL      CALCIUM      8.6 - 10.5 mg/dL      E-GFR, African American (Female)      >60 See Note mL/min/1.63m2      E-GFR, Non-African American (Female)      >60 See Note mL/min/1.768m      CHOLESTEROL      0 - 200 mg/dL      HDL CHOLESTEROL      >=35 mg/dL      LDL CHOLESTEROL CALCULATION      <130 mg/dL      TOTAL CHOLESTEROL:HDL RATIO      <4.0      TRIGLYCERIDE      35 - 160 mg/dL      NON-HDL CHOLESTEROL      <=150 mg/dL      MICROALBUMIN URINE      mg/dL   36.5   CREATININE SPOT URINE      mg/dL  103.66 103.66   MICROALBUMIN/CREATININE RATIO      <30 mg/g   352 (H)   HGB A1C      3.9 - 5.6 % 6.5 (H)     HGB A1C,GLUCOSE EST AVG      mg/dL 140       Component      Latest Ref Rng & Units 04/19/2018 04/19/2018 12/23/2017          10:58 AM 10:58 AM  9:23 AM   SODIUM      135 - 145 mmol/L  141    POTASSIUM      3.3 - 5.0 mmol/L  4.0    CHLORIDE      95 - 110 mmol/L  104    CARBON DIOXIDE TOTAL      24 - 32 mmol/L  25    UREA NITROGEN, BLOOD (BUN)      8 - 22 mg/dL  21    CREATININE BLOOD      0.44 - 1.27 mg/dL  0.96    GLUCOSE      70 - 99 mg/dL  104 (  H)    CALCIUM      8.6 - 10.5 mg/dL  9.2    E-GFR, African American (Female)      >60 See Note mL/min/1.29m2  64   E-GFR, Non-African American (Female)      >60 See Note mL/min/1.780m  60    CHOLESTEROL      0 - 200 mg/dL   161   HDL CHOLESTEROL      >=35 mg/dL   61   LDL CHOLESTEROL CALCULATION      <130 mg/dL   71   TOTAL CHOLESTEROL:HDL RATIO      <4.0   2.6   TRIGLYCERIDE      35 - 160 mg/dL   145   NON-HDL CHOLESTEROL      <=150 mg/dL   100   MICROALBUMIN URINE      mg/dL      CREATININE SPOT URINE      mg/dL      MICROALBUMIN/CREATININE RATIO      <30 mg/g      HGB A1C      3.9 - 5.6 % 7.1 (H)     HGB A1C,GLUCOSE EST AVG      mg/dL 157         CT ABDOMEN + PELVIS UROGRAM  EXAM DATE: 04/08/2017 5:21 PM  COMPARISON:  12/03/2016  IMPRESSION:  1. No cause for hematuria. No urolithiasis, renal mass, or  uroepithelial mass in the kidneys, ureters, or urinary bladder.  2. No hydronephrosis or hydroureter.  3. Unchanged 1.8 cm left adrenal adenoma.    ASSESSMENT AND PLAN:     (E11.22,  N18.3,  Z79.4) Type 2 diabetes mellitus with stage 3 chronic kidney disease, with long-term current use of insulin (HCC)  (primary encounter diagnosis)  I reviewed most recent lab results and glucometer data with patient. A1c now at goal.  Continue current meds, GFR >45.  Counseled not to skip/delay meals due to risk of hypoglycemia.  Counseled to check glucose level twice a day.She has been provided with verbal and written instructions on management of hypoglycemia.Counseled on importance of yearly DR screening. Counseled on daily foot care.     (I10) Essential hypertension  BP elevated above goal of less than 130/80.  Instructed again to monitor at home, she will obtain a BP monitor.  Instructed to follow up with PCP if frequently higher than 130/80.    (E78.2) Mixed hyperlipidemia  LDL less than 100 on Lipitor.  Will be due for repeat lipid panel at next appt.    (E27.9) Adrenal nodule ([place] According to prior notes, biochemicalevaluation was normal in 9/2017and 11/2016. Found to have stability by imaging in 04/2017.Repeathormonal evaluation in7/2019was normal (none of 24 hr urine lab results were > 2x the ULN). Continue annual biochemical screening until 2022 (for total of 5 years).  She will complete pheo and Cushing's screening labs prior to next appt, will order 24 hr UFC for Cushing's screening for continuity/consistency with her previous labs.      Follow up in 3 months with labs prior.    Patient verbalizes understanding of teaching and instructions. ?The patient and/or caregiver was educated regarding his/her medical care. I reviewed past medical, family/social and medication history during the visit.     Approximately 35 minutes  of face-to-face time was spent with patient and >50% of this time was spent counseling/educating on diabetes, HTN, and adrenal adenoma, answering patient's questions, and coordinating care.    Electronically signed  by:    Daryl Eastern, MD  Associate Physician  Endocrinology  [facility], [place]

## 2018-09-17 NOTE — Patient Instructions (Addendum)
Please get fasting labs done in August 2020 approximately 1 to 2 weeks prior to your follow up appointment.     Bring your glucometer to all your Endocrinology Clinic appointments.  Please ensure that the date and time on your glucometer are correct at all times.  If you do not know how to adjust the date and time on your glucometer, call the Endocrinology Clinic medical assistant for help.              Avoid these drugs 5 days before doing the 24-hour urine collection labs:   decongestants     Avoid the following for 1 day before the urine collection:   alcohol   bananas   caffeine   chocolate   nicotine

## 2018-09-20 ENCOUNTER — Ambulatory Visit: Payer: Medicare Other | Admitting: "Endocrinology

## 2018-09-20 ENCOUNTER — Other Ambulatory Visit: Payer: Self-pay | Admitting: Family Medicine

## 2018-09-20 ENCOUNTER — Encounter: Payer: Self-pay | Admitting: "Endocrinology

## 2018-09-20 VITALS — BP 143/86 | HR 81 | Temp 97.4°F | Wt 175.9 lb

## 2018-09-20 DIAGNOSIS — E1122 Type 2 diabetes mellitus with diabetic chronic kidney disease: Secondary | ICD-10-CM

## 2018-09-20 DIAGNOSIS — N183 Chronic kidney disease, stage 3 unspecified: Secondary | ICD-10-CM

## 2018-09-20 DIAGNOSIS — E279 Disorder of adrenal gland, unspecified: Secondary | ICD-10-CM

## 2018-09-20 DIAGNOSIS — I10 Essential (primary) hypertension: Secondary | ICD-10-CM

## 2018-09-20 DIAGNOSIS — Z794 Long term (current) use of insulin: Secondary | ICD-10-CM

## 2018-09-20 DIAGNOSIS — E278 Other specified disorders of adrenal gland: Secondary | ICD-10-CM

## 2018-09-20 DIAGNOSIS — E782 Mixed hyperlipidemia: Secondary | ICD-10-CM

## 2018-09-20 MED ORDER — PEN NEEDLE, DIABETIC 31 GAUGE X 5/16"
3 refills | Status: AC
Start: 2018-09-20 — End: 2019-09-20

## 2018-09-20 MED ORDER — METFORMIN 1,000 MG TABLET: 1000.0000 mg | ORAL_TABLET | Freq: Two times a day (BID) | ORAL | 3 refills | Status: DC

## 2018-09-20 MED ORDER — GLIPIZIDE ER 10 MG TABLET, EXTENDED RELEASE 24 HR
20.0000 mg | EXTENDED_RELEASE_CAPSULE | Freq: Every day | ORAL | 3 refills | Status: DC
Start: 2018-09-20 — End: 2019-04-27

## 2018-09-20 NOTE — Nursing Note (Signed)
Vital signs taken, allergies verified, screened for pain, tobacco hx verified.     Amyre Segundo, MA

## 2018-11-09 ENCOUNTER — Ambulatory Visit: Payer: Vision Other Private Insurance | Admitting: Optometrist

## 2018-11-09 DIAGNOSIS — E083292 Diabetes mellitus due to underlying condition with mild nonproliferative diabetic retinopathy without macular edema, left eye: Secondary | ICD-10-CM

## 2018-11-09 DIAGNOSIS — H524 Presbyopia: Secondary | ICD-10-CM

## 2018-11-09 DIAGNOSIS — E119 Type 2 diabetes mellitus without complications: Secondary | ICD-10-CM

## 2018-11-09 NOTE — Progress Notes (Signed)
Chief Complaint   Patient presents with    Eye Glasses / Contact Lens Rx     73 yo female here for CEE & glasses update. CC: Pt reports stable VA w/current glasses. Likes black/white pair more than turquoise pair. Also, c/o of itchiness around the eyes/orbit and occasional floaters that comes and goes.       Lab Results   Lab Name Value Date/Time    HGBA1C 6.5 (H) 09/14/2018 08:43 AM    HGBA1C 7.1 (H) 04/19/2018 10:58 AM    HGBA1C 6.8 (H) 10/19/2017 09:38 AM    HGBA1C 6.6 (H) 08/30/2015 08:35 AM    HGBA1C 7.7 (H) 03/23/2015 09:04 AM    HGBA1C 6.9 (H) 12/06/2014 11:45 AM       Patient Active Problem List   Diagnosis    DM2 (diabetes mellitus, type 2) ([place])    Depression with anxiety    Stress at home    Leg cramps-right calf    Right ear pain    Fibromyalgia    HTN (hypertension)    GERD (gastroesophageal reflux disease)    Hyperlipidemia with target LDL less than 70    Vitamin D insufficiency    Abnormal LFTs    Renal cyst ( 6.4 cm cyst left kidney)    Cough    Fatty liver    Macroalbuminuric diabetic nephropathy (HCC)    History of actinic keratoses    Cataract    Ptosis of eyelid    Liver fibrosis (HCC)    Adrenal nodule (HCC)    Medication Therapy Auth    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   Status post CE OU    Assessment/Plan:    1. Presbyopia. Stable prescription. Released a glasses prescription to the patient, if desired.    2. Status post cataract extraction OU. Stable.     3. 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   No evidence of diabetic  retinopathy nor clinically significant macular edema was present in either eye.  I emphasized the long-term risk to vision from diabetes and the importance of strict glycemic control, blood pressure control, along with regular eye exams to detect diabetic retinopathy at an early stage to prevent vision loss. She should return in one year for a dilated eye examination, or sooner if any changes are noticed.    Return to clinic in one year for a general eye examination or sooner if any changes are noticed.    Patient WAS wearing a mask during the exam.  Droplet precautions were taken for the patient.  PPE used by the provider: surgical mask    Nalany Steedley Pederson-VanBuskirk, O.D, F.A.A.O.  Energy manager of Ophthalmology

## 2018-11-15 ENCOUNTER — Encounter: Payer: Self-pay | Admitting: "Endocrinology

## 2018-11-15 NOTE — Telephone Encounter (Signed)
From: [name]  To: Vance Gather, MD  Sent: 11/14/2018 12:10 PM PDT  Subject: Non-urgent Medical Advice Question    Hi Dr., you had asked me to let you know if I got to 36 units of Lantus a night. I have and seemed to be staying there. My numbers are staying above 120 to about 170. Granted I am not eating right and the stress level is so very hi. We are having to return to [place] this week as the medical team feels my mother is in her final days. I don't know what if anything you want changed but I wanted to let you know as requested. Thank you.

## 2018-12-14 ENCOUNTER — Ambulatory Visit: Payer: Medicare Other | Admitting: Dermatology

## 2018-12-15 ENCOUNTER — Ambulatory Visit: Payer: Medicare Other

## 2018-12-20 ENCOUNTER — Encounter: Payer: Self-pay | Admitting: "Endocrinology

## 2018-12-20 NOTE — Telephone Encounter (Signed)
From: [name]  To: Minna Merritts, MD  Sent: 12/19/2018 9:38 PM PDT  Subject: Non-urgent Medical Advice Question    Hi Dr., Radonna Ricker and I are so looking forward to rejoining your patient list. We will see you on Sept 22nd. Dr Valma Cava has ordered blood and 24 hour urine test for me and blood for him. We will be having the blood drawn this Thursday so you will have it when we see you. Just wondering if there was anything else you would want [place] to have done? Unfortunately we have to get these tests now because we have to go to [place] this Saturday. We lost my mom a few weeks ago and dealing with that now. Looking forward to seeing you.

## 2018-12-23 ENCOUNTER — Ambulatory Visit: Payer: Medicare Other | Attending: "Endocrinology

## 2018-12-23 DIAGNOSIS — Z794 Long term (current) use of insulin: Secondary | ICD-10-CM

## 2018-12-23 DIAGNOSIS — N183 Chronic kidney disease, stage 3 unspecified: Secondary | ICD-10-CM

## 2018-12-23 DIAGNOSIS — E782 Mixed hyperlipidemia: Secondary | ICD-10-CM | POA: Insufficient documentation

## 2018-12-23 DIAGNOSIS — E1122 Type 2 diabetes mellitus with diabetic chronic kidney disease: Secondary | ICD-10-CM

## 2018-12-23 LAB — BASIC METABOLIC PANEL
Calcium: 8.9 mg/dL (ref 8.6–10.5)
Carbon Dioxide Total: 23 mmol/L — ABNORMAL LOW (ref 24–32)
Chloride: 105 mmol/L (ref 95–110)
Creatinine Serum: 0.91 mg/dL (ref 0.44–1.27)
E-GFR Creatinine (Female): 64 mL/min/{1.73_m2}
E-GFR, African American (Female): 74 mL/min/{1.73_m2}
Glucose: 120 mg/dL — ABNORMAL HIGH (ref 70–99)
Potassium: 4.1 mmol/L (ref 3.3–5.0)
Sodium: 140 mmol/L (ref 135–145)
Urea Nitrogen, Blood (BUN): 16 mg/dL (ref 8–22)

## 2018-12-23 LAB — LIPID PANEL
Cholesterol: 138 mg/dL (ref 0–200)
HDL Cholesterol: 52 mg/dL (ref >=35)
LDL Cholesterol Calculation: 58 mg/dL (ref <130)
Non-HDL Cholesterol: 86 mg/dL (ref <=150)
Total Cholesterol: HDL Ratio: 2.7 (ref <4.0)
Triglyceride: 139 mg/dL (ref 35–160)

## 2018-12-24 ENCOUNTER — Encounter: Payer: Self-pay | Admitting: "Endocrinology

## 2018-12-24 LAB — HEMOGLOBIN A1C
Hgb A1C,Glucose Est Avg: 146 mg/dL
Hgb A1C: 6.7 % — ABNORMAL HIGH (ref 3.9–5.6)

## 2018-12-29 ENCOUNTER — Ambulatory Visit: Payer: Medicare Other | Admitting: "Endocrinology

## 2019-01-25 ENCOUNTER — Encounter: Payer: Self-pay | Admitting: Dermatology

## 2019-01-25 ENCOUNTER — Ambulatory Visit: Payer: Medicare Other | Admitting: Dermatology

## 2019-01-25 ENCOUNTER — Ambulatory Visit: Payer: Medicare Other | Attending: "Endocrinology | Admitting: "Endocrinology

## 2019-01-25 ENCOUNTER — Encounter: Payer: Self-pay | Admitting: "Endocrinology

## 2019-01-25 VITALS — Temp 97.8°F

## 2019-01-25 DIAGNOSIS — E1129 Type 2 diabetes mellitus with other diabetic kidney complication: Secondary | ICD-10-CM | POA: Insufficient documentation

## 2019-01-25 DIAGNOSIS — E119 Type 2 diabetes mellitus without complications: Secondary | ICD-10-CM | POA: Insufficient documentation

## 2019-01-25 DIAGNOSIS — Z79899 Other long term (current) drug therapy: Secondary | ICD-10-CM | POA: Insufficient documentation

## 2019-01-25 DIAGNOSIS — Z794 Long term (current) use of insulin: Secondary | ICD-10-CM | POA: Insufficient documentation

## 2019-01-25 DIAGNOSIS — E279 Disorder of adrenal gland, unspecified: Secondary | ICD-10-CM | POA: Insufficient documentation

## 2019-01-25 DIAGNOSIS — L578 Other skin changes due to chronic exposure to nonionizing radiation: Secondary | ICD-10-CM

## 2019-01-25 DIAGNOSIS — L821 Other seborrheic keratosis: Secondary | ICD-10-CM

## 2019-01-25 DIAGNOSIS — Z7984 Long term (current) use of oral hypoglycemic drugs: Secondary | ICD-10-CM | POA: Insufficient documentation

## 2019-01-25 DIAGNOSIS — E118 Type 2 diabetes mellitus with unspecified complications: Secondary | ICD-10-CM | POA: Insufficient documentation

## 2019-01-25 DIAGNOSIS — L570 Actinic keratosis: Secondary | ICD-10-CM

## 2019-01-25 DIAGNOSIS — Z1283 Encounter for screening for malignant neoplasm of skin: Secondary | ICD-10-CM

## 2019-01-25 MED ORDER — LANTUS SOLOSTAR U-100 INSULIN 100 UNIT/ML (3 ML) SUBCUTANEOUS PEN: PEN_INJECTOR | SUBCUTANEOUS | 3 refills | Status: DC

## 2019-01-25 MED ORDER — EMPAGLIFLOZIN 25 MG TABLET
25.0000 mg | ORAL_TABLET | Freq: Every day | ORAL | 3 refills | Status: DC
Start: 2019-01-25 — End: 2019-11-01

## 2019-01-25 MED ORDER — ATORVASTATIN 10 MG TABLET: 10.0000 mg | ORAL_TABLET | Freq: Every day | ORAL | 3 refills | Status: DC

## 2019-01-25 NOTE — Progress Notes (Signed)
Endocrinology Follow up clinic note:    Chief Complaint:  "I'm here to follow up on my diabetes."    HPI: Paula Daugherty is a 73yrold female who has a diagnosis of type 2 diabetes mellitus who presents to Endocrinology for follow up. Her history is as follows:  She was initially diagnosed with diabetes around 2000 on routine labs. She has a strong family history of diabetes so she wasn't surprised at this. She was started on Metformin around 2005 and then she was started on insulin in 2014. She was up to 64 units of Lantus at night. However, after making changes to her diet and lifestyle, she was taken off insulin in 2015. Basal insulin was added back to her regimen in 2018.      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    Interim History: She returns today for follow up. Her last visit with Endocrinology was on 09/20/2018 with Dr. MValma Cava Since this visit, she states that overall, she has been doing ok. Her mother died earlier this year and [place] for her. She thinks that from a diabetes standpoint, things have been relatively stable recently. She continues to deal with chronic abdominal pain and nausea for which she will be following up with Dr. CVirgel Bouquetand having repeating endoscopies done later this year.     Current Regimen:   Lantus 36 units QHS   Metformin 1000 mg BID   Jardiance 25 mg daily   Glipizide XR 20 mg daily  Insulin injection technique: Holds the needle in for a few seconds with each injection  Injection sites: Abdomen  Rotates sites: Yes  Discards insulin 30 days after opening: Yes  Hypoglycemia: Yes, has dropped as low as the 60s occasional (usually in the afternoon)   Awareness: Yes, when blood glucose is in the 80s  Hyperglycemia: Almost never sees numbers >180    Meter brought today: Yes  Home blood glucose log brought to clinic today: No  Checks blood glucose: 2x/day  Home  blood glucose numbers per pt's log:   Blood Glucose:     Pre-breakfast: 93 to 181    Pre-lunch: 60 to 82 (only checks with symptoms of low blood sugar)   Diet and Exercise: Has not made any recent changes to diet, has been active with going through mother's house  Last eye exam: 11/09/2018 no diabetic retinopathy, f/u 1 year  Foot exam: Will do today      ROS:  All other systems were reviewed and are negatative except for pertinent positive and negative responses as documented in HPI.     Medications:  Medication reconciliation was not performed today.   Current Outpatient Medications on File Prior to Visit   Medication Sig Dispense Refill    Amlodipine (NORVASC) 10 mg Tablet Take 1 tablet by mouth every day. 90 tablet 3    Blood Sugar Diagnostic (ONETOUCH ULTRA TEST) Strips Use to test blood glucose 2 times a day 200 strip 3    GlipiZIDE (GLUCOTROL XL) 10 mg SR Tablet Take 2 tablets by mouth every morning with a meal. (diabetes) 180 tablet 3    Insulin Pen Needles, Disposable, 31 gauge x 5/16" For insulin administration once a day 100 each 3    Lancing Device with Lancets (ONE TOUCH DELICA) Kit Check glucose level twice a day 1 kit 0  Losartan (COZAAR) 25 mg Tablet Take 1 tablet by mouth every day. 90 tablet 3    Metformin (GLUCOPHAGE) 1,000 mg tablet Take 1 tablet by mouth 2 times daily with meals. (diabetes) 180 tablet 3    Omeprazole (PRILOSEC) 20 mg Delayed Release Capsule TAKE 1 CAPSULE BY MOUTH TWICE DAILY BEFORE MEALS 180 capsule 1    Ondansetron (ZOFRAN) 8 mg Tablet Take 1 tablet by mouth every 8 hours if needed. 270 tablet 3     No current facility-administered medications on file prior to visit.        I did review patient's past medical and family/social history, no changes noted.   PMH:  Past medical history was reviewed from problem list.   Patient Active Problem List   Diagnosis    DM2 (diabetes mellitus, type 2) ([place])    Depression with anxiety    Stress at home    Leg cramps-right calf     Right ear pain    Fibromyalgia    HTN (hypertension)    GERD (gastroesophageal reflux disease)    Hyperlipidemia with target LDL less than 70    Vitamin D insufficiency    Abnormal LFTs    Renal cyst ( 6.4 cm cyst left kidney)    Cough    Fatty liver    Macroalbuminuric diabetic nephropathy (HCC)    History of actinic keratoses    Cataract    Ptosis of eyelid    Liver fibrosis (HCC)    Adrenal nodule (HCC)    Medication Therapy Auth    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HCC)    Mixed conductive and sensorineural hearing loss of both ears    Neoplasm of uncertain behavior of skin    Abdominal pain, generalized    Nausea without vomiting    Asymptomatic microscopic hematuria    Subcutaneous nodule    Angina pectoris (HCC)    Mild episode of recurrent major depressive disorder (HCC)    NASH (nonalcoholic steatohepatitis)     VITAL SIGNS:  Temp: 36.6 C (97.8 F) (09/22 1003)  Temp src: Temporal (09/22 1003)  Pulse: 61 (09/22 1003)  BP: 134/81 (09/22 1003)  Resp: --  SpO2: 98 % (09/22 1003)  Height: 157.5 cm (5' 2" ) (09/22 1003)  Weight: 79 kg (174 lb 2.6 oz) (09/22 1003)    PHYSICAL EXAM:  General Appearance: healthy, alert, no distress, pleasant affect, cooperative.  Eyes:  conjunctivae and corneas clear. PERRL, EOM's intact. sclerae normal.  Neck:  Neck supple. No adenopathy, thyroid symmetric, normal size.  Heart:  normal rate and regular rhythm, no murmurs, clicks, or gallops.  Lungs: clear to auscultation.  Abdomen: BS normal.  Abdomen soft, non-tender.  No masses or organomegaly.  Extremities:  no cyanosis, clubbing, or edema.  Foot Exam: normal DP and PT pulses, no trophic changes or ulcerative lesions, normal sensory exam and normal monofilament exam.  Skin:  Skin color, texture, turgor normal. No rashes or lesions.  Neuro: Gait normal. Reflexes normal and symmetric. Sensation and strength grossly normal.  Mental Status: Appearance/Cooperation: in no apparent distress  and well developed and well nourished  Eye Contact: normal  Speech: normal volume, rate, and pitch    LAB TESTS/STUDIES:   I personally reviewed the following laboratory and imaging studies.     03/29/2018 14:40 04/19/2018 10:58 09/14/2018 08:43 12/23/2018 10:10   SODIUM  141  140   POTASSIUM  4.0  4.1   CHLORIDE  104  105   CARBON  DIOXIDE TOTAL  25  23 (L)   UREA NITROGEN, BLOOD (BUN)  21  16   CREATININE BLOOD  0.96  0.91   GLUCOSE  104 (H)  120 (H)   CALCIUM  9.2  8.9   E-GFR, African American (Female)  39  74   E-GFR, Non-African American (Female)  60  64   CHOLESTEROL    138   TRIGLYCERIDE    139   LDL CHOLESTEROL CALCULATION    58   HDL CHOLESTEROL    52   NON-HDL CHOLESTEROL    86   TOTAL CHOLESTEROL:HDL RATIO    2.7   THYROID STIMULATING HORMONE 1.21      HGB A1C  7.1 (H) 6.5 (H) 6.7 (H)   HGB A1C,GLUCOSE EST AVG  157 140 146      12/23/2017 09:23   CHOLESTEROL 161   TRIGLYCERIDE 145   LDL CHOLESTEROL CALCULATION 71   HDL CHOLESTEROL 61   NON-HDL CHOLESTEROL 100   TOTAL CHOLESTEROL:HDL RATIO 2.6   IRON TOTAL 30 (L)   TRANSFERRIN 281   TOTAL IRON BINDING CAPACITY 391   IRON PERCENT SATURATION 7.7 (L)   FERRITIN 11      11/11/2016 11:09   ALDOSTERONE,BLOOD 16.6   HGB A1C 7.8 (H)   HGB A1C,GLUCOSE EST AVG 177   RENIN ACTIVITY 1.1      12/23/2017 09:23   White Blood Cell Count 6.9   Hemoglobin 12.6   Hematocrit 39.8   MCV 76.3 (L)   RDW 17.8 (H)   Platelet Count 235      04/19/2018 12:17   CREATININE SPOT URINE 103.66   MICROALBUMIN URINE 36.5   MICROALBUMIN/CREATININE RATIO 352 (H)     12/23/2017:   Ref Range & Units     TOTAL VOLUME  mL  1750     TIME OF COLLECTION  hr  24     DOPAMINE,UR PER VOLUME  ug/L  111     DOPAMINE,UR PER 24HR  56 - 272 ug/d  194     DOPAMINE,UR RATIO TO CRT  0 - 250 ug/g CRT  182     NOREPINEPHRINE,UR PER VOLUME  ug/L  33     NOREPINEPHRINE,UR PER 24HR  11 - 60 ug/d  58     NOREPINEPHRINE,UR RATIO TO CRT  0 - 45 ug/g CRT  54High       EPINEPHRINE,UR PER VOLUME  ug/L  1     EPINEPHRINE,UR  PER 24 HR  1 - 14 ug/d  2     EPINEPHRINE,UR RATIO TO CRT  0 - 20 ug/g CRT  2       Ref Range & Units     TOTAL VOLUME  mL  1750     TIME OF COLLECTION  hr  24     CORTISOL,UR FREE PER VOLUME  ug/L  19.50     CORTISOL,UR FREE PER 24HR  <=45.0 ug/d  34.1     CORTISOL,UR FREE RATIO TO CRT  ug/g CRT  31.97       Ref Range & Units     TOTAL VOLUME  mL  1750     TIME OF COLLECTION  hr  24     METANEPHRINE,UR-PER VOLUME  ug/L  65     METANEPHRINE,UR-PER 24HR  39 - 143 ug/d  114     METANEPHRINE,UR-RATIO TO CRT  0 - 300 ug/g CRT  108     NORMETANEPHRINE,UR-PER VOLUME  ug/L  265     NORMETANEPHRINE,UR-PER 24HR  109 - 393 ug/d  464High       NORMETANEPHRIN,UR-RATIO TO CRT  0 - 400 ug/g CRT  442High       METANEPHRINES INTERPRETATION   See Note        Ref Range & Units     Urine Volume  mL  1,750     START DATE   12/22/17     START TIME   7:45 AM     STOP DATE   12/23/17     STOP TIME   7:45 AM     COLLECTION INTERVAL, URINE  H  24.00     Creatinine 24 Hr  800-2,000 mg/24Hr  1,058       CT Abdomen (04/08/2017):  FINDINGS:  URINARY COLLECTING SYSTEM:  Renal stones: None.  Ureteral stones: None.  Bladder stones: None.  Solid / enhancing renal mass: None. Left interpolar cyst with thin  peripheral calcification. Additional subcentimeter hypodensities, too small  to characterize but statistically likely benign.  Renal enhancement: Normal symmetric enhancement of the kidneys.  There is segmental nonopacification of the bilateral distal ureters.  Hydronephrosis: None.  Hydroureter: None.  Renal collecting system: No intraluminal filling defects.  Ureters: No intraluminal filling defects in the opacified segments of the  ureters. Segments of the ureter that are not opacified do not show any wall  thickening, suspicion of an enhancing mass, or focal or segmental  dilatation.  Urinary bladder: No focal or asymmetric bladder wall thickening, focal  filling defects, or enhancing polypoid lesions.    REMAINING ABDOMEN AND PELVIS:  Lower  Chest: Heavy coronary artery calcification. Bibasilar atelectasis.  Liver: Unchanged hepatic steatosis and hepatomegaly.  Bile Ducts: Unremarkable.  Gallbladder: Unremarkable.  Pancreas: Unremarkable.  Spleen: Unremarkable.  Adrenal Glands: Unchanged 1.8 cm left adenoma.  GI Tract: Diverticulosis without evidence of acute diverticulitis.  Peritoneal Cavity: No free fluid or free air.  Uterus and Ovaries: Status post hysterectomy. No adnexal masses.  Lymph Nodes: Unchanged 1.3 cm porta hepatic lymph node. Unchanged  additional smaller upper abdominal lymph nodes.  Major Vascular Structures: Moderate aortoiliac artery calcification.  Soft Tissues: Unchanged partially calcified left gluteal soft tissue  densities, likely injection granulomas.  Musculoskeletal: No suspicious bony lesions or acute fractures. Mild  multilevel degenerative changes of the visualized spine.    IMPRESSION:  1. No cause for hematuria. No urolithiasis, renal mass, or  uroepithelial mass in the kidneys, ureters, or urinary bladder.  2. No hydronephrosis or hydroureter.  3. Unchanged 1.8 cm left adrenal adenoma.    Impression: This is a 73yrold female with Diabetes Mellitus Type II who presents to Endocrinology for follow up. Overall, her glycemic control is at goal as evidenced by her most recent A1c of 6.7%.  Review of her blood glucose meter download today demonstrates that her blood glucose tends to run the highest in the mornings. Because of this, I have recommended a slight increase in her Lantus dose.    She was encouraged to report any hypoglycemia or persistent hyperglycemia prior to follow up.      She also has a history of an adrenal nodule. Hormonal evaluation was normal in 12/2016 and she will be due for repeat hormonal evaluation now.     DIABETES HISTORY  (-) h/o retinopathy.      (-) h/o microalbuminuria/nephropathy.  (-) h/o neuropathy.  (-) h/o Autonomic dysfunction  (-) h/o Gastroparesis  (-) h/o CAD  (-) h/o PVD  Last eye  exam: 11/2018, f/u 1year  Last urine microalbumin: 04/2018, 352  Pneumovax: 12/2014, Prevnar 2018  Influenza vaccine: 12/2018  (+) ASA  (+) Statin  (+) ACE/ARB          Recommendations:  (E11.29) Type 2 diabetes mellitus with other diabetic kidney complication  (primary encounter diagnosis)  - Continue current dose of metformin, Jardiance, and glipizide  - Increase Lantus to 38 units QHS  - Encouraged dietary changes and increased exercise  - Encouraged patient to continue blood glucose monitoring to at least 1x/day, goal to check 2x/day  - Last LDL at goal, continue statin, repeat due 12/2018  - Last microalbumin:creatinine ratio elevated, continue cozaar, repeat due 04/2019  - Up to date on screening eye exam, next due 11/2019  - Repeat A1c prior to follow up appointment     2. Adrenal nodule  - Biochemical evaluation normal in 12/2017, stability by imaging in 04/2017  - Repeat hormonal evaluation due noq (~1 year from previous) for 5 years total (2022)     Approximately 30 minutes were spent with patient, greater than 50% of which was spent counseling the patient on diabetes management and on coordination of care.    Follow up in 3 months    If you have any questions, please do not hesitate to contact me at [phone].  Thank you for allowing me to participate in the care of this patient.    EDUCATION:  I educated/instructed the patient or caregiver regarding all aspects of the above stated plan of care.  The patient or caregiver indicated understanding.      [employer] interpreter was not used.    Report electronically signed by:  Sunday Spillers, M.D.  Clinical Assistant Professor  Department of Endocrinology  Pager # [phone]

## 2019-01-25 NOTE — Patient Instructions (Addendum)
1. No changes to Jardiance or Metformin doses    2. Please increase Lantus to 38 units every night.   - Notify Dr. Rosary Lively if you have any low blood sugar or persistently high blood sugar (>150)    3. Return to clinic in 3 months with fasting blood and urine tests first      Try alpha lipoic acid for neuropathy

## 2019-01-25 NOTE — Nursing Note (Signed)
Vital signs taken, allergies verified, screened for pain,tobacco history verified, pharmacy verified.     Omar Sanchez, MA

## 2019-01-25 NOTE — Progress Notes (Signed)
Follow-up Visit    History of Present Illness:     The patient is a 73 y/o female with no hx of skin cancer presenting for a skin check.      Her main concern today is "dry, peeling skin" on the nasal bridge.  This has been ongoing for 3-4 months.  She notes that the skin flakes off, clears up, then recurs.  No tenderness or spontaneous bleeding from this site.  Reports history of cryotherapy in this area years ago.      Pt otherwise has no skin concerns today.        Past Medical History:  1.  Negative for skin cancer           Family History:  1.  Negative for melanoma         Social History:  1.  Tobacco use:  None     2.  Occupation:  Retired       Medications and Allergies: reviewed by MD today        Review of Systems: The patient was asked about the following fever, fatigue, weight loss, visual changes, wheezing, cough, headache, sore throat, bleeding gums, joint or muscle pain, shortness of breath, chest pain, lymph node swelling, infections, pain in calves, vomiting, diarrhea, burning with urination, depression/anxiety, dizziness.     Positive for: hay fever/pollen allergy, nausea, numbness/tingling. All other ROS are negative.         Physical Examination:    General:  Appears comfortable, well-developed, well-nourished, and pleasant with normal affect.  Neuro:  Alert and oriented to person, time, and place    Cutaneous:      Exam of the scalp, ears, face, eyelids/eyes, lips, neck, chest, axillae, abdomen, back, buttocks/groin, right and left upper extremities, right and left lower extremities, and digits & nails were notable for the following:    Sun-exposed areas:  mottled dyspigmentation, telangiectasias, and elastosis    Nasal bridge:  pink to erythematous macules and thin papules with adherent white scale    Trunk / Extremities:  Many brown to tan keratotic papules and plaques         Assessment / Plan:       1.  Dermatoheliosis   - Florance Fetter protection reviewed including use of sunscreen SPF 30 or  greater, use of wide-brimmed hat, and sun avoidance       2.  Actinic Keratosis---Nasal Bridge     - Recommended treatment with liquid nitrogen; pt agreeable  - Counseled regarding risks including scar formation, dyspigmentation, potential for recurrence   - A total of 1 lesions were treated with liquid nitrogen; procedure was tolerated well; after-care reviewed      3.  Seborrheic Keratoses---Trunk / Extremities    - Reassurance       Follow-up in 12 months, or sooner as needed.

## 2019-01-25 NOTE — Nursing Note (Signed)
Patient ID x2. Vitals signs taken, allergies verified, screened for pain, and preferred pharmacy verified. Refills needed - no. POC glucose test performed - no. POC A1C test performed - no. Downloaded glucose meter(s) - yes. Downloaded insulin pump - no. Patient WAS wearing a surgical mask  Droplet precautions were followed when caring for the patient.   PPE used by provider during encounter:I was wearing Face Shield/Goggles   Kaileen Bronkema Del Toro, MA II

## 2019-01-27 ENCOUNTER — Ambulatory Visit: Payer: Medicare Other | Attending: "Endocrinology

## 2019-01-27 DIAGNOSIS — E279 Disorder of adrenal gland, unspecified: Secondary | ICD-10-CM | POA: Insufficient documentation

## 2019-01-27 DIAGNOSIS — E278 Other specified disorders of adrenal gland: Secondary | ICD-10-CM

## 2019-01-27 LAB — CREATININE 24 HOUR URINE
COLLECTION INTERVAL, URINE: 24 h
Creatinine 24 Hr: 1061 mg/(24.h) (ref 800–2000)
Creatinine Conc: 86.97 mg/dL
Urine Volume: 1220 mL

## 2019-01-28 ENCOUNTER — Other Ambulatory Visit: Payer: Self-pay | Admitting: "Endocrinology

## 2019-01-28 ENCOUNTER — Encounter: Payer: Self-pay | Admitting: "Endocrinology

## 2019-01-28 DIAGNOSIS — E1129 Type 2 diabetes mellitus with other diabetic kidney complication: Secondary | ICD-10-CM

## 2019-01-28 MED ORDER — LANTUS SOLOSTAR U-100 INSULIN 100 UNIT/ML (3 ML) SUBCUTANEOUS PEN: PEN_INJECTOR | SUBCUTANEOUS | 3 refills | Status: DC

## 2019-01-28 NOTE — Telephone Encounter (Signed)
From: Paula Daugherty  To: Minna Merritts, MD  Sent: 01/27/2019 5:33 PM PDT  Subject: Visit Follow-up Question    Hi Doctor, I wanted you to know I dropped off my 24 hour urine collection this morning. Also [facility] is trying to reach you. Seems your order for my Lantus was only for just over 30 days. I asked them to get an order for 90 days. Cost is the same for 30 or 90, $40.00. Thank you for your attention. DJ and I are both so glad to be back under your care. You are a great doctor and very special. See you before the new year. Stay safe.

## 2019-01-28 NOTE — Telephone Encounter (Signed)
Re-sending with modified quantity and units (ML). Authorized per P&T Protocol 01/28/2019

## 2019-01-28 NOTE — Telephone Encounter (Signed)
Provider: Sunday Spillers, ENDO    Refill request has been pended to the pharmacist for review per P&T Protocol. 01/28/2019    Resending Lantus with clarified qty. Sent today for #15 pens and 3 refills. Insulins have to be sent in ml's not pens

## 2019-02-01 LAB — CORTISOL,URINE FREE SENDOUT
CORTISOL,UR FREE PER 24HR: 9.4 ug/d (ref <=45.0)
CORTISOL,UR FREE PER VOLUME: 7.7 ug/L
CORTISOL,UR FREE RATIO TO CRT: 9.39 ug/g CRT
CREATININE,UR PER 24HR: 1000 mg/d (ref 500–1400)
CREATININE,UR PER VOLUME: 82 mg/dL
TIME OF COLLECTION: 24 hr
TOTAL VOLUME: 1220 mL

## 2019-02-01 LAB — METANEPHRINES FRACTIONATED,UR SENDOUT
CREATININE,UR PER 24HR: 1037 mg/d (ref 500–1400)
CREATININE,UR PER VOLUME: 85 mg/dL
METANEPHRINE,UR-PER 24HR: 117 ug/d (ref 36–229)
METANEPHRINE,UR-PER VOLUME: 96 ug/L
METANEPHRINE,UR-RATIO TO CRT: 113 ug/g CRT (ref 0–300)
NORMETANEPHRIN,UR-RATIO TO CRT: 451 ug/g CRT — ABNORMAL HIGH (ref 0–400)
NORMETANEPHRINE,UR-PER 24HR: 467 ug/d (ref 95–650)
NORMETANEPHRINE,UR-PER VOLUME: 383 ug/L
TIME OF COLLECTION: 24 hr
TOTAL VOLUME: 1220 mL

## 2019-02-02 ENCOUNTER — Encounter: Payer: Self-pay | Admitting: GASTROENTEROLOGY

## 2019-02-02 NOTE — Telephone Encounter (Signed)
From: [name]  To: Georgeanne Nim, MD  Sent: 02/01/2019 4:01 PM PDT  Subject: Non-urgent Medical Advice Question    Paula Daugherty, I know we are not scheduled to see each other until Dec 18th but I wanted to share with you. I have had 3 episodes in the last couple weeks of losing total control of my bowels. Trying not to gross you out but it comes without warning. It doesn't matter where I am or what I'm doing. A small amount of cramping starts then very lose fecal matter just starts to literally pour out of me. Nothing seems to stop it, no matter how hard I try. The mess is awful and I end up having to throw away my clothing. I don't know what is happening. I don't know if I should be concerned? Can you share any thoughts on how to handle? We are in [place]. My mother died in 18-Dec-2022 and we are here dealing with her estate. Next Tuesday we leave to drive back to [place]. I am nervous about another episode driving that drive. Look forward to your thoughts. Thanks, Paula Daugherty

## 2019-02-06 ENCOUNTER — Other Ambulatory Visit: Payer: Self-pay | Admitting: "Endocrinology

## 2019-02-06 DIAGNOSIS — E1129 Type 2 diabetes mellitus with other diabetic kidney complication: Secondary | ICD-10-CM

## 2019-02-08 ENCOUNTER — Encounter: Payer: Self-pay | Admitting: "Endocrinology

## 2019-02-08 NOTE — Telephone Encounter (Signed)
Appreciate Bev Conley Canal, RN's assistance.     Sunday Spillers, M.D.  Clinical Associate Professor  Department of Endocrinology  Pager # [phone]

## 2019-02-08 NOTE — Telephone Encounter (Signed)
From: [name]  To: Minna Merritts, MD  Sent: 02/08/2019 1:36 PM PDT  Subject: MyChart Refill Request    Hi Dr., the pharmacy says my Vania Rea needs a new Rx. They contacted Dr. Valma Cava to refill but she declined. Would you be willing to send in a new Rx for 90 days supply? As always I am so appreciative. We are in [place], [place] on our way home. Days of long drives ahead. Stay safe

## 2019-02-16 ENCOUNTER — Telehealth: Payer: Self-pay | Admitting: Optometrist

## 2019-02-16 NOTE — Telephone Encounter (Signed)
Hello Dr. Lolita Patella,    Patient is calling in stating that they received a $50 charge for the refraction from the visit with you in July. It looks like we billed [facility] but used a medical diagnosis. Can you please changed that so they can have the billing department re- bill?      Thank you,  [name]

## 2019-03-17 ENCOUNTER — Other Ambulatory Visit: Payer: Self-pay | Admitting: Family Medicine

## 2019-03-17 MED ORDER — OMEPRAZOLE 20 MG CAPSULE,DELAYED RELEASE
20.0000 mg | DELAYED_RELEASE_CAPSULE | Freq: Two times a day (BID) | ORAL | 0 refills | Status: DC
Start: 2019-03-17 — End: 2019-06-17

## 2019-03-28 ENCOUNTER — Encounter: Payer: Self-pay | Admitting: "Endocrinology

## 2019-03-28 NOTE — Telephone Encounter (Signed)
From: [name]  To: Minna Merritts, MD  Sent: 03/28/2019 4:30 PM PST  Subject: Non-urgent Medical Advice Question    Hi Dr., I have my next cancer screening endoscopy on December 4th at 8:30. They always want me to check with my doctor regarding diabetic medication. Is there any adjustment or change you want me to make for this procedure?   Hope that you and the family are doing well. We will see you next month.

## 2019-03-29 ENCOUNTER — Other Ambulatory Visit: Payer: Self-pay | Admitting: GASTROENTEROLOGY

## 2019-03-29 DIAGNOSIS — Z01818 Encounter for other preprocedural examination: Secondary | ICD-10-CM

## 2019-04-04 ENCOUNTER — Telehealth: Payer: Self-pay | Admitting: GASTROENTEROLOGY

## 2019-04-04 NOTE — Telephone Encounter (Signed)
Pre procedure call: Call from patient.  3 patient identifications obtained.  Patient confirmed EGD appointment on 04/08/19 at 0830, aware must have a driver secured for after procedure. Covid Screening questionnaire completed. Patient denied having flu like symptoms. Patient aware to attend covid test appointment on 04/06/19. Instructed to practice self isolation between Covid-19 test and GI procedure. Also, to wear a mask at all times when outside of home, practice social distancing and good hand washing. Patient confirmed she is aware of EGD instructions as she has had procedure done previously. Instructed to call [facility] until 4:30pm M-F if questions arise. All questions answered and verbalized understanding.     Paula Daugherty, LVN

## 2019-04-04 NOTE — Telephone Encounter (Signed)
Pre procedure call: Left message on patient's voicemail regarding upcoming GI appointment. Instructed to read instructions and secure ride home for day of appointment. Reminded patient to attend covid test appointment on 04/06/19 as it is required and to practice self isolation between Covid-19 test and GI procedure. Also, to wear a mask at all times when outside of home, practice social distancing and good hand washing. If any unexplained cough, fever above 100F (37.8), shortness of breath,  H/A, vomiting, diarrhea, abdominal pain or rash, cough, fever, shortness of breath, body aches, sore throat, chills, loss of taste or smell call [phone] until 4:30pm M-F. Patient is scheduled for EGD on 04/08/19 with [facility].    Paula Daugherty, LVN

## 2019-04-06 ENCOUNTER — Ambulatory Visit: Payer: Medicare Other | Attending: GASTROENTEROLOGY

## 2019-04-06 DIAGNOSIS — Z01818 Encounter for other preprocedural examination: Secondary | ICD-10-CM | POA: Insufficient documentation

## 2019-04-06 LAB — COVID-2019 RNA, QUAL: SARS-CoV-2: NOT DETECTED

## 2019-04-06 NOTE — Nursing Note (Signed)
The following procedures were done per physician's order: Patient was swabbed for covid testing.     Patient WAS wearing a surgical mask  Contact and Droplet precautions were followed when caring for the patient.   PPE used by provider during encounter: N95 mask, Face Shield/Goggles, Gown and Gloves    Swabbed both nostrils.   Patient tolerated well.    Kadisha Goodine, LVN

## 2019-04-07 NOTE — Progress Notes (Signed)
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     Problem List:  Patient Active Problem List   Diagnosis    DM2 (diabetes mellitus, type 2) ([facility]    Depression with anxiety    Stress at home    Leg cramps-right calf    Right ear pain    Fibromyalgia    HTN (hypertension)    GERD (gastroesophageal reflux disease)    Hyperlipidemia with target LDL less than 70    Vitamin D insufficiency    Abnormal LFTs    Renal cyst ( 6.4 cm cyst left kidney)    Cough    Fatty liver    Macroalbuminuric diabetic nephropathy ([place]    History of actinic keratoses    Cataract    Ptosis of eyelid    Liver fibrosis    Adrenal nodule ([facility]    Medication Therapy Auth    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[facility]    Mixed conductive and sensorineural hearing loss of both ears    Neoplasm of uncertain behavior of skin    Abdominal pain, generalized    Nausea without vomiting    Asymptomatic microscopic hematuria    Subcutaneous nodule    Angina pectoris (HCC)    Mild episode of recurrent major depressive disorder (HCC)    NASH (nonalcoholic steatohepatitis)       PMH/PSH:  Past Medical History:   Diagnosis Date    Angina pectoris ([facility] 09/23/2017    Anxiety     Asthma     Cirrhosis ([facility]     Constipation, chronic     Depression     Diabetes mellitus ([facility]     Fatty liver     Hypertension     Kidney disease 2015    cyst left kidney per pt    Liver fibrosis ([facility]     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/20/2016    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[facility] 04/17/2016    Psychiatric illness      Past Surgical History:   Procedure  Laterality Date    BIOFEEDBACK PERI/URO/RECTAL  10/21/2017    session #1: N Score = 5.4    BIOFEEDBACK PERI/URO/RECTAL  11/04/2017    session #2    BIOFEEDBACK PERI/URO/RECTAL  11/18/2017    Session #3    BIOFEEDBACK PERI/URO/RECTAL  12/02/2017    Session #4    BIOFEEDBACK PERI/URO/RECTAL  12/30/2017    Session #5    BIOFEEDBACK PERI/URO/RECTAL  03/23/2018    Session #6    CAPSULE ENDOSCOPY  01/27/2018    CAPSULE ENDOSCOPY  01/29/2018         COLONOSCOPY  01/04/14    polyp--TA and erosion; hemorrhoids; tics; repeat x 3 yrs    COLONOSCOPY  12/16/2017    TA; repeat in 3 yrs    EGD      HC BIOFEEDBACK PERI/URO/RECTAL  05/02/2018         HC COLONOSCOPY,REMV LESN,SNARE  12/16/2017    MAMMOPLASTY, REDUCTION  1985    MANOMETRY  09/02/2017    work on constipation; kegels and biofeedback;  proceed w/colonoscopy     PHACOEMULSIFICATION, CATARACT Right 02/12/2015    with IOL    PR ESOPHAGOGASTRODUODENOSCOPY TRANSORAL DIAGNOSTIC  02/12/2016    EGD--polyp--path pending; antral gastritis; no varices     PR ESOPHAGOGASTRODUODENOSCOPY TRANSORAL DIAGNOSTIC  06/11/2016    EGD--biopsy at site of carcinoid tumor path pending     PR ESOPHAGOGASTRODUODENOSCOPY TRANSORAL DIAGNOSTIC      EGD--folds in duodenum--path pending     PR ESOPHAGOGASTRODUODENOSCOPY TRANSORAL DIAGNOSTIC  04/2017    EGD; repeat EGD in 1 year     PR ESOPHAGOGASTRODUODENOSCOPY TRANSORAL DIAGNOSTIC  04/20/2018    gastritis; nodule--path pending    PR KNEE SCOPE,DIAGNOSTIC  1980s    Knee arthroscopy    PR LIGATE FALLOPIAN TUBE      Tubal ligation    PR REPAIR TYMPANIC MEMBRANE      Tympanoplasty    PR TOTAL ABDOM HYSTERECTOMY  1980s    partial; no BSO    REPAIR, BLEPHAROPTOSIS Bilateral 08/06/2015    Blepharoplasty bilateral upper lids       FH:  The patient has a family history of   Family History   Problem Relation Name Age of Onset    Diabetes Father      Heart Mother          MI at 30s     Non-contributory Brother      Non-contributory  Brother           Social History:  Social History     Tobacco Use    Smoking status: Former Smoker     Packs/day: 0.10     Years: 20.00     Pack years: 2.00    Smokeless tobacco: Never Used    Tobacco comment: quit 30 years ago   Substance Use Topics    Alcohol use: Yes     Alcohol/week: 0.0 standard drinks     Comment: rare       Current Meds:  Current Outpatient Medications   Medication Sig Dispense Refill    Amlodipine (NORVASC) 10 mg Tablet Take 1 tablet by mouth every day. 90 tablet 3    Atorvastatin (LIPITOR) 10 mg Tablet Take 1 tablet by mouth every day at bedtime. 90 tablet 3    Blood Sugar Diagnostic (ONETOUCH ULTRA TEST) Strips Use to test blood glucose 2 times a day 200 strip 3    Empagliflozin (JARDIANCE) 25 mg Tablet Take 1 tablet by mouth every day. 90 tablet 3    GlipiZIDE (GLUCOTROL XL) 10 mg SR Tablet Take 2 tablets by mouth every morning with a meal. (diabetes) 180 tablet 3    Insulin Glargine (LANTUS SOLOSTAR U-100 INSULIN) 100 unit/mL (3 mL) Pen Inject 40 units subcutaneously every night at bedtime. Please provide 3 month supply. 45 mL 3    Insulin Pen Needles, Disposable, 31 gauge x 5/16" For insulin administration once a day 100 each 3    Lancing Device with Lancets (ONE TOUCH DELICA) Kit Check glucose level twice a day 1 kit 0    Losartan (COZAAR) 25 mg Tablet Take 1 tablet by mouth every day. 90 tablet 3    Metformin (GLUCOPHAGE) 1,000 mg tablet Take 1 tablet by mouth 2 times daily with meals. (diabetes) 180 tablet 3    Omeprazole (PRILOSEC) 20 mg Delayed Release Capsule Take 1 capsule by mouth two times daily before meals. 180 capsule 0    Ondansetron (ZOFRAN) 8 mg Tablet Take 1 tablet by mouth every 8 hours if needed. [place]  tablet 3     No current facility-administered medications for this visit.        Allergy:  Allergies   Allergen Reactions    Contrast Dye [Radiopaque Agent] Hives    Hydrochlorothiazide Other-Reaction in Comments     Patient reports    Januvia  [Sitagliptin] Other-Reaction in Comments     Patient reports    Lyrica [Pregabalin] Other-Reaction in Comments     Patient reports    Omnipaque [Iohexol] Other-Reaction in Comments     Patient reports    Prozac [Fluoxetine Hcl] Other-Reaction in Comments     Patient reports    Sulfa (Sulfonamide Antibiotics) Rash    Topiramate Other-Reaction in Comments     Hairfall       I reviewed all available medical, surgical, personal/social history.    ROS:    All other systems are negative.     Physical Exam:   General Appearance: NAD; Alert, oriented  HEENT: Anicteric  Heart: RRR, S1/S2 nl  Lungs: CTAB  Abdomen: BS+, soft, nt, nd    Labs/Imaging:  Reviewed  Lab Results   Lab Name Value Date/Time    WBC 6.9 12/23/2017 09:23 AM    WBC 8.9 08/30/2015 08:35 AM    HGB 12.6 12/23/2017 09:23 AM    HGB 14.4 08/30/2015 08:35 AM    HCT 39.8 12/23/2017 09:23 AM    HCT 39.8 12/23/2017 09:23 AM    HCT 44.6 08/30/2015 08:35 AM    PLT 235 12/23/2017 09:23 AM    PLT 217 08/30/2015 08:35 AM     Lab Results   Lab Name Value Date/Time    NA 140 12/23/2018 10:10 AM    NA 142 08/30/2015 08:35 AM    K 4.1 12/23/2018 10:10 AM    K 4.0 08/30/2015 08:35 AM    CL 105 12/23/2018 10:10 AM    CL 104 08/30/2015 08:35 AM    CO2 23 (L) 12/23/2018 10:10 AM    CO2 22 (L) 08/30/2015 08:35 AM    BUN 16 12/23/2018 10:10 AM    BUN 19 08/30/2015 08:35 AM    CR 0.91 12/23/2018 10:10 AM    CR 0.88 08/30/2015 08:35 AM    GLU 120 (H) 12/23/2018 10:10 AM    GLU 139 (H) 08/30/2015 08:35 AM     Lab Results   Lab Name Value Date/Time    AST 50 (H) 06/29/2017 02:54 PM    AST 71 (H) 08/30/2015 08:35 AM    ALT 46 06/29/2017 02:54 PM    ALT 69 (H) 08/30/2015 08:35 AM    ALP 137 (H) 06/29/2017 02:54 PM    ALP 104 08/30/2015 08:35 AM    ALB 3.7 06/29/2017 02:54 PM    ALB 4.2 08/30/2015 08:35 AM    TP 8.0 06/29/2017 02:54 PM    TP 8.5 (H) 08/30/2015 08:35 AM    TBIL 1.0 06/29/2017 02:54 PM    TBIL 0.9 08/30/2015 08:35 AM         Impression:    Neyra Pettie is a 73yrold  female with plan for EGD      Patient is a candidate for moderate sedation.  Patient is ASA status:  2 - Mild, controlled systemic disease and no functional limitations    Airway Assessment:    Mallampati image    Mallampati Class:  2    The procedure, risks, benefits, and alternatives were explained.  All patient questions were answered.  The informed consent was  signed.    Patient barriers to learning:  none    Patient/family understanding:  verbalizes    Attending attestation: I saw and evaluated the patient.  I agree with the findings and the plan of care as documented in the fellow's note.    Georgeanne Nim MD, MPH  Assistant Professor of Internal Medicine  Division of Gastroenterology and Hepatology

## 2019-04-08 ENCOUNTER — Ambulatory Visit: Payer: Medicare Other | Attending: Anatomic Pathology | Admitting: GASTROENTEROLOGY

## 2019-04-08 ENCOUNTER — Encounter: Payer: Self-pay | Admitting: GASTROENTEROLOGY

## 2019-04-08 VITALS — BP 130/72 | HR 69 | Temp 97.3°F | Resp 16 | Ht 62.0 in | Wt 177.9 lb

## 2019-04-08 DIAGNOSIS — Z859 Personal history of malignant neoplasm, unspecified: Secondary | ICD-10-CM

## 2019-04-08 DIAGNOSIS — K2950 Unspecified chronic gastritis without bleeding: Secondary | ICD-10-CM | POA: Insufficient documentation

## 2019-04-08 DIAGNOSIS — K2900 Acute gastritis without bleeding: Secondary | ICD-10-CM | POA: Insufficient documentation

## 2019-04-08 DIAGNOSIS — C7A8 Other malignant neuroendocrine tumors: Secondary | ICD-10-CM

## 2019-04-08 HISTORY — PX: EGD: AMBSHX0053

## 2019-04-08 MED ORDER — FENTANYL (PF) 50 MCG/ML INJECTION SOLUTION
INTRAMUSCULAR | 0 refills | Status: DC
Start: 2019-04-08 — End: 2019-04-08

## 2019-04-08 MED ORDER — SODIUM CHLORIDE 0.9 % INTRAVENOUS SOLUTION
INTRAVENOUS | 0 refills | Status: DC
Start: 2019-04-08 — End: 2019-04-08

## 2019-04-08 MED ORDER — LIDOCAINE HCL 2 % MUCOSAL SOLUTION
0 refills | Status: DC
Start: 2019-04-08 — End: 2019-04-08

## 2019-04-08 MED ORDER — DIPHENHYDRAMINE 50 MG/ML INJECTION SOLUTION
INTRAMUSCULAR | 0 refills | Status: DC
Start: 2019-04-08 — End: 2019-04-08

## 2019-04-08 MED ORDER — MIDAZOLAM (PF) 1 MG/ML INJECTION SOLUTION
INTRAMUSCULAR | 0 refills | Status: DC
Start: 2019-04-08 — End: 2019-04-08

## 2019-04-08 NOTE — Nursing Note (Signed)
Patient WAS wearing a surgical mask  Droplet precautions were followed when caring for the patient.   PPE used by provider during encounter: N95 mask, Face Shield/Goggles, Gown and Gloves.  Patient tolerated EGD procedure well. See MD note. Vital sign stable. To RR with report.Delbert Harness, RN

## 2019-04-08 NOTE — Telephone Encounter (Signed)
From: [name]  To: Georgeanne Nim, MD  Sent: 04/08/2019 1:31 PM PST  Subject: Visit Follow-up Question    Hi Eric, you didn't come back after the procedure so I could thank you. Plus wish you and your family wonderful holidays. I hope you didn't find anything wrong today. I know I will get results when they come back. Again thank you for being a wonderful doctor. DJ sends his best as well.

## 2019-04-08 NOTE — Patient Instructions (Addendum)
Discharge Instructions for upper endoscopy    Findings:  Dear Paula Daugherty,    Your upper endoscopy revealed no obvious evidence of recurrence of the tumor; however, we did biopsy the site of the tumor. We also saw some mild inflammation in the stomach, so we obtained biopsies of this area as well. We will notify you of the results.     Activity:    Rest today, resume usual activitiy tomorrow     Diet:    Resume usual diet:     Medication:    Resume all usual medications    Follow up:   Call primary care physician for follow-up appointment and We will notify you of your biopsy results via phone call or letter.If you do not receive notice of your results by three weeks, please call us at the phone number provided below.      During your procedure, air was pumped into your GI tract so your doctor could see clearly to make a diagnosis and/or treat your problem.     Some possible side effects you may experience are:   - Discomfort due to a distended (bloated) abdomen which will subside after a few hours to two days.   - Nausea may be a side effect of the medication and will subside.   - The medications you received may make you dizzy and sleepy, it is important that you do not drive, operate machinery or drink alcohol for at least one day.   - Severe pain is not expected and should be reported    Other side effects may include:  Upper Endoscopy: A sore throat can be helped by cough drops, or a salt-water gargle.    If problems, call [facility] at [phone] during business hours Monday through Friday 7:30am to 4:30 pm.  After hours call [phone] and ask to speak to the GI Fellow on call.       Gastritis: Care Instructions  Your Care Instructions     Gastritis is a sore and upset stomach. It happens when something irritates the stomach lining. Many things can cause it. These include an infection such as the flu or something you ate or drank. Medicines or a sore on the lining of the stomach (ulcer) also can  cause it. Your belly may bloat and ache. You may belch, vomit, and feel sick to your stomach.  You should be able to relieve the problem by taking medicine. And it may help to change your diet. If gastritis lasts, your doctor may prescribe medicine.  Follow-up care is a key part of your treatment and safety. Be sure to make and go to all appointments, and call your doctor if you are having problems. It's also a good idea to know your test results and keep a list of the medicines you take.  How can you care for yourself at home?   If your doctor prescribed antibiotics, take them as directed. Do not stop taking them just because you feel better. You need to take the full course of antibiotics.   Be safe with medicines. If your doctor prescribed medicine to decrease stomach acid, take it as directed. Call your doctor if you think you are having a problem with your medicine.   Do not take any other medicine, including over-the-counter pain relievers, without talking to your doctor first.   If your doctor recommends over-the-counter medicine to reduce stomach acid, such as Pepcid AC, Prilosec, Tagamet HB, or Zantac  75, follow the directions on the label.   Drink plenty of fluids (enough so that your urine is light yellow or clear like water) to prevent dehydration. Choose water and other caffeine-free clear liquids. If you have kidney, heart, or liver disease and have to limit fluids, talk with your doctor before you increase the amount of fluids you drink.   Limit how much alcohol you drink.   Avoid coffee, tea, cola drinks, chocolate, and other foods with caffeine. They increase stomach acid.  When should you call for help?  Call 911 anytime you think you may need emergency care. For example, call if:   You vomit blood or what looks like coffee grounds.   You pass maroon or very bloody stools.  Call your doctor now or seek immediate medical care if:   You start breathing fast and have not produced urine in  the last 8 hours.   You cannot keep fluids down.  Watch closely for changes in your health, and be sure to contact your doctor if:   You do not get better as expected.   Where can you learn more?   Go to http://blackburn.com/  Enter Z536 in the search box to learn more about "Gastritis: Care Instructions."    2006-2015 [employer]. Care instructions adapted under license by [facility]. This care instruction is for use with your licensed healthcare professional. If you have questions about a medical condition or this instruction, always ask your healthcare professional. [place] any warranty or liability for your use of this information.  Content Version: 10.6.465758; Current as of: March 18, 2013

## 2019-04-08 NOTE — Nursing Note (Signed)
Patient WAS wearing a surgical mask.  Contact & Droplet precautions were followed when caring for the patient.  PPE used by provider during encounter: Surgical mask and face shield/goggles/gloves.    Amount of time spent educating patient: 30 minutes    Education                 Title: Endoscopy (Resolved)     Topic: Overview (Resolved)     Point: Indications (Resolved)     Description:   Indications for a GI endoscopy may include:       biopsy    difficulty swallowing    gastric or esophageal ulcer    GI bleeding    unexplained anemia    persistent heartburn    persistent vomiting    persistent reflux symptoms despite therapy    cancer screening    abnormal radiologic finding    foreign body removal    diagnostic for inflammatory bowel disease    Focus education on patient-specific indication.           Patient Friendly Description:   There are many reasons to have this procedure.       The doctor will talk to you about why it is important for you.    Please ask questions if you do not understand.    Learning Progress Summary           Patient Acceptance, E, VU by SP at 04/08/2019 1036    Acceptance, E, VU by DL at 04/08/2019 0859                   Point: Procedure Review (Resolved)     Description:   Gastrointestinal (GI) endoscopy is a procedure to examine the lining of the esophagus, stomach, upper small intestine, colon or rectum.       A flexible, lighted fiber optic scope is used.    Review anticipated anesthesia type.    Post-procedure care will include:    monitoring during sedation recovery    IV fluid    gradual diet advancement           Patient Friendly Description:   A gastrointestinal (GI) endoscopy is a long, flexible tube.       It looks at the lining of parts of your digestive system.    It is put down the throat or in your bottom.    A light and a tiny camera is at the end of the scope. Other special tools can be used through a hole in the scope.    Medicine will help make you sleepy and  comfortable.    Learning Progress Summary           Patient Acceptance, E, VU by SP at 04/08/2019 1036    Acceptance, E, VU by DL at 04/08/2019 0859                         Topic: Self-Management (Resolved)     Point: Activity (Resolved)     Description:   Review post-discharge activity restrictions which may include:       driving    work/school    Someone should be with the patient to make sure he/she is safe until fully recovered from sedation.           Patient Friendly Description:   Follow the doctor's instructions for activity.       Rest for today. Do not try to do  too much.    There may be limits set for:    work, school    playing sports, exercise    driving as applicable    use of tools or appliances    signing of legal papers    Someone should stay with you until you are safe or have completely recovered from sedation.    Learning Progress Summary           Patient Acceptance, E, VU by SP at 04/08/2019 1036                   Point: Fluid/Food Intake (Resolved)     Description:   Encourage advancing diet gradually once swallowing has returned to normal and patient is alert.              Patient Friendly Description:   After the procedure, your throat might be numb. When you can take small sips of water without coughing, you may start eating and drinking.       Learning Progress Summary           Patient Acceptance, E, VU by SP at 04/08/2019 1036                   Point: Resuming Home Medication (Resolved)     Description:   Resume regular medications as directed by provider after procedure.       Patient may need to hold anticoagulants, sedatives and narcotics for a specified amount of time.           Patient Friendly Description:   The doctor will tell you when it is safe to take home medicines.       Depending on the procedure, you may be asked to wait to take some medicines again.    This might include:    blood thinners    pain medicine    medicines that help you relax    Learning Progress Summary            Patient Acceptance, E, VU by SP at 04/08/2019 1036                   Point: Promote Flatus Passing (Resolved)     Description:   Encourage gas expulsion and alternating positions to facilitate.              Patient Friendly Description:   Air was used to help see better during the procedure.       Extra air can give your child/you stomach pain after the procedure.    It will be important to pass gas and burp the extra air out. Walking and changing position helps move air up or down.    Right side-lying and fetal position (knees to the chest) might help with comfort.    Learning Progress Summary           Patient Acceptance, E, VU by SP at 04/08/2019 1036                         Topic: When to Seek Medical Attention (Resolved)     Point: Nausea and Vomiting (Resolved)     Description:   Provider (specify) should be called if:       unable to tolerate food, drink or medication    loss of appetite    symptoms do not resolve    Immediate medical care should be sought if:  forceful vomiting    blood in stool or vomit    symptoms become worse           Patient Friendly Description:   Do not force yourself to eat, but try to sip some clear liquids.       Call the doctor if:    you can't keep food or drink down without getting sick    you don't have an appetite    you do not feel better    Get medical care right away if:    you see blood in your poop (stool) or vomit    you feel worse    Learning Progress Summary           Patient Acceptance, E, VU by SP at 04/08/2019 1036                   Point: Sleepiness, Persistent (Resolved)     Description:   Provider (specify) should be called if:       continued sedation effects the next day    symptoms do not resolve    Immediate medical care should be sought if:    difficult to arouse/change in level of consciousness    symptoms become worse           Patient Friendly Description:   Call the doctor if:       you have a hard time waking up the next day    symptoms do not get  better    Learning Progress Summary           Patient Acceptance, E, VU by SP at 04/08/2019 1036                               User Key     Initials Effective Dates Name Provider Type Discipline    SP 03/08/18 Posey Pronto, Ardyth Man, RN .NURSE: (RN or LVN) nurse    DL 03/08/18 Carney Bern, [place], [place] .NURSE: (RN or LVN) nurse

## 2019-04-08 NOTE — Nursing Note (Signed)
Patient WAS wearing a surgical mask  Droplet precautions were followed when caring for the patient.   PPE used by provider during encounter: Surgical mask, Face Shield/Goggles and Gloves     Nursing Pre-Procedure Assessment    Paula Daugherty arrived by ambulating  into clinic today at 0810 from home.      Patient has arranged for a ride home from [facility] who can be contacted at [phone]. Patient agrees to having individual providing ride home present while the post procedure instructions and results are given.Instructed patient that post sedation they are not to drive, drink alcohol, sign legal documents, or operate machinery for the remainder of the day    Verified procedure with the patient scheduled today for: EGD.    Outpatient Medications Marked as Taking for the 04/08/19 encounter (Office Visit) with [name], MD   Medication Sig Dispense Refill    DiphenhydrAMINE (BENADRYL) 50 mg/mL Injection 12.5-25 mg IV every 3 min PRN (NTE 50 mg); Give 54m PRN inadequate sedation after Midazolam given or as directed by MD 2 mL 0    Fentanyl (SUBLIMAZE) 50 mcg/mL Injection 12.5-50 mcg IV every 3 min PRN pain (NTE 200 mcg); Give 25 mcg until patient sedated but arousable or as directed by MD 4 mL 0    Lidocaine (XYLOCAINE) 2 % Viscous Solution Gargle and swallow 5 mL undiluted solution prior to GI Lab procedure. 15 mL 0    Midazolam (VERSED) 1 mg/mL Injection 0.5-2 mg IV every 3 min PRN sedation (NTE 10 mg); Give 1 mg until patient sedated but arousable or as directed by MD 10 mL 0    NaCl 0.9% Infusion IV at [facility] as directed by MD;  May use another 500 mL bag as needed. 1000 mL 0     Anticoagulants: Patient denies taking Aspirin, NSAIDs, or other anticoagulants for the past 5 days.  OTC/Herbal preparations: no    Reviewed with the patient her health status in regards to allergies (including latex), medications, pregnancy, hypertension, atrial fibrilation, liver disease, bleeding disorders, diabetes,   CVA's, seizures, sleep apnea, glaucoma, cancer, prosthesis or implants, infectious diseases, surgical history; and disease of heart, lungs, liver, or kidneys. Positive history reviewed and updated in Health History section of the EMR.    Patient Active Problem List   Diagnosis    DM2 (diabetes mellitus, type 2) ([place]    Depression with anxiety    Stress at home    Leg cramps-right calf    Right ear pain    Fibromyalgia    HTN (hypertension)    GERD (gastroesophageal reflux disease)    Hyperlipidemia with target LDL less than 70    Vitamin D insufficiency    Abnormal LFTs    Renal cyst ( 6.4 cm cyst left kidney)    Cough    Fatty liver    Macroalbuminuric diabetic nephropathy (HCC)    History of actinic keratoses    Cataract    Ptosis of eyelid    Liver fibrosis    Adrenal nodule (HCC)    Medication Therapy Auth    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HCC)    Mixed conductive and sensorineural hearing loss of both ears    Neoplasm of uncertain behavior of skin    Abdominal pain, generalized    Nausea without vomiting    Asymptomatic microscopic hematuria    Subcutaneous nodule    Angina pectoris (HCC)    Mild episode of recurrent major depressive disorder ([place]  NASH (nonalcoholic steatohepatitis)     Allergies   Allergen Reactions    Contrast Dye [Radiopaque Agent] Hives    Hydrochlorothiazide Other-Reaction in Comments     Patient reports    Januvia [Sitagliptin] Other-Reaction in Comments     Patient reports    Lyrica [Pregabalin] Other-Reaction in Comments     Patient reports    Omnipaque [Iohexol] Other-Reaction in Comments     Patient reports    Prozac [Fluoxetine Hcl] Other-Reaction in Comments     Patient reports    Sulfa (Sulfonamide Antibiotics) Rash    Topiramate Other-Reaction in Comments     Hairfall     Past Medical History:   Diagnosis Date    Angina pectoris ([place]) 09/23/2017    Anxiety     Asthma     Cirrhosis ([place])     Constipation, chronic      Depression     Diabetes mellitus ([place])     Fatty liver     Hypertension     Kidney disease 2015    cyst left kidney per pt    Liver fibrosis ([place])     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/20/2016    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[place]) 04/17/2016    Psychiatric illness      Past Surgical History:   Procedure Laterality Date    BIOFEEDBACK PERI/URO/RECTAL  10/21/2017    session #1: N Score = 5.4    BIOFEEDBACK PERI/URO/RECTAL  11/04/2017    session #2    BIOFEEDBACK PERI/URO/RECTAL  11/18/2017    Session #3    BIOFEEDBACK PERI/URO/RECTAL  12/02/2017    Session #4    BIOFEEDBACK PERI/URO/RECTAL  12/30/2017    Session #5    BIOFEEDBACK PERI/URO/RECTAL  03/23/2018    Session #6    CAPSULE ENDOSCOPY  01/27/2018    CAPSULE ENDOSCOPY  01/29/2018         COLONOSCOPY  01/04/14    polyp--TA and erosion; hemorrhoids; tics; repeat x 3 yrs    COLONOSCOPY  12/16/2017    TA; repeat in 3 yrs    EGD      HC BIOFEEDBACK PERI/URO/RECTAL  05/02/2018         HC COLONOSCOPY,REMV LESN,SNARE  12/16/2017    MAMMOPLASTY, REDUCTION  1985    MANOMETRY  09/02/2017    work on constipation; kegels and biofeedback; proceed w/colonoscopy     PHACOEMULSIFICATION, CATARACT Right 02/12/2015    with IOL    PR ESOPHAGOGASTRODUODENOSCOPY TRANSORAL DIAGNOSTIC  02/12/2016    EGD--polyp--path pending; antral gastritis; no varices     PR ESOPHAGOGASTRODUODENOSCOPY TRANSORAL DIAGNOSTIC  06/11/2016    EGD--biopsy at site of carcinoid tumor path pending     PR ESOPHAGOGASTRODUODENOSCOPY TRANSORAL DIAGNOSTIC      EGD--folds in duodenum--path pending     PR ESOPHAGOGASTRODUODENOSCOPY TRANSORAL DIAGNOSTIC  04/2017    EGD; repeat EGD in 1 year     PR ESOPHAGOGASTRODUODENOSCOPY TRANSORAL DIAGNOSTIC  04/20/2018    gastritis; nodule--path pending    PR KNEE SCOPE,DIAGNOSTIC  1980s    Knee arthroscopy    PR LIGATE FALLOPIAN TUBE      Tubal ligation    PR REPAIR TYMPANIC MEMBRANE      Tympanoplasty    PR TOTAL ABDOM  HYSTERECTOMY  1980s    partial; no BSO    REPAIR, BLEPHAROPTOSIS Bilateral 08/06/2015    Blepharoplasty bilateral upper lids     Social History     Tobacco Use   Smoking Status  Former Smoker    Packs/day: 0.10    Years: 20.00    Pack years: 2.00   Smokeless Tobacco Never Used   Tobacco Comment    quit 30 years ago     Social History     Substance and Sexual Activity   Drug Use No       Pre Procedure Checks:    Identaband on and two forms of identification information verified: yes  Pt completed bowel prep: GI Procedure  NPO since: mn  Belongings are with patient under gurney.  Patient has: Glasses and cell phone.  Barriers to Learning assessed: none. Patient verbalizes understanding of teaching and instructions.    Past anesthesia responses:  no prior anesthesia experience.  Nursing Assessment Data Base:  Ventilation   --  Respirations: normal, unlabored.  Circulation/Perfusion -- Skin: warm, normal color and dry.  Cognition/Communication -- Behavior: alert and oriented and cooperative.  Gastro-Intestinal: no distress.  Abdomen: soft and non-tender.  Level of Ambulation: self.    Reviewed written post procedure instructions with the patient (What To Expect After Your Procedure).   Barriers to Learning assessed: none. Patient verbalizes understanding of teaching and instructions.  Nursing assessment completed by:   Antonieta Pert, LVN, RN    Date: 04/08/2019 Time: [phone]

## 2019-04-08 NOTE — Telephone Encounter (Signed)
Will forward to MD for review and response.  Thank you. Meg Zoiee Wimmer RN

## 2019-04-08 NOTE — Procedures (Addendum)
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     Details of the Procedure:    Informed consent was obtained for the procedure, including sedation. Risks of perforation, hemorrhage, adverse drug reaction, pain, infection and aspiration were discussed. The patient was placed in the left lateral decubitus position. Based on the pre-procedure assessment, including review of the patient's medical history, medications, allergies, and review of systems, she had been deemed to be an appropriate candidate for conscious sedation; she was therefore sedated with the medications listed below. The patient was monitored continuously with EKG tracing, pulse oximetry, blood pressure monitoring, and direct observations.     An oral examination was performed. The Olympus endoscope was inserted into the mouth and advanced under direct vision to the second portion of the duodenum.   A careful inspection was made as the gastroscope was withdrawn, including a retroflexed view of the proximal stomach; findings and interventions are described below. Appropriate photo documentation was obtained. The patient tolerated the procedure well, and there were no complications. She was taken to the recovery area in stable condition.     Sedation:   Versed 4 mg IV and Fentanyl 100 mcg IV   Sedation was administered for single procedure/procedures    I was the responsible provider supervising  the administration of moderate sedation to this patient by a trained independent provider (Investment banker, corporate. I was present during the interservice time from initial administration of sedatives until the end of the procedure at the time noted above.      Findings and Interventions:   Esophageal mucosa normal  Esophageal-gastric junction localized at 37 cm.  Squamo-columnar junction regular  Hiatal hernia absent  Gastric mucosa mild gastritis, more pronounced in antrum. Random biopsies in antrum and body obtained.  Duodenal mucosa site of prior NET tumor without evidence of obvious recurrence. Biopsied. No evidence of duodenitis.   Retroflexed view normal    Impression:   1. Mild gastritis, s/p random biopsies  2. No obvious endoscopic recurrence of NET; biopsies obtained.     Recommendation/Plan:   Follow up pathology.    [employer] K. SManuella Ghazi MD  PGY4, Gastroenterology and Hepatology Fellow     Attending attestation: I was present for the entire viewing portion of the procedure.  I agree with the findings and plan of care as documented in the fellow's note.    EGeorgeanne NimMD, MPH  Assistant Professor of Internal Medicine  Division of Gastroenterology and Hepatology

## 2019-04-11 ENCOUNTER — Encounter: Payer: Self-pay | Admitting: GASTROENTEROLOGY

## 2019-04-11 ENCOUNTER — Encounter: Payer: Self-pay | Admitting: Family Medicine

## 2019-04-11 ENCOUNTER — Telehealth: Payer: Self-pay | Admitting: GASTROENTEROLOGY

## 2019-04-11 LAB — SURGICAL PATHOLOGY

## 2019-04-11 NOTE — Telephone Encounter (Signed)
Post-Procedure call to patient. Three identifiers obtained. Informed patient that this is a follow up to her EGD on 04/08/2019. States she feels fine.  Did not voice any concerns. Informed to reach out to [place] if any questions or concerns. Verbalized understanding.      Frederik Schmidt Senior [facility]

## 2019-04-12 ENCOUNTER — Other Ambulatory Visit: Payer: Self-pay | Admitting: Family Medicine

## 2019-04-12 DIAGNOSIS — Z1231 Encounter for screening mammogram for malignant neoplasm of breast: Secondary | ICD-10-CM

## 2019-04-20 ENCOUNTER — Ambulatory Visit: Payer: Medicare Other | Attending: "Endocrinology

## 2019-04-20 DIAGNOSIS — E1129 Type 2 diabetes mellitus with other diabetic kidney complication: Secondary | ICD-10-CM

## 2019-04-20 DIAGNOSIS — Z794 Long term (current) use of insulin: Secondary | ICD-10-CM

## 2019-04-20 LAB — COMPREHENSIVE METABOLIC PANEL
Alanine Transferase (ALT): 43 U/L (ref 5–54)
Albumin: 3.9 g/dL (ref 3.2–4.6)
Alkaline Phosphatase (ALP): 112 U/L (ref 35–115)
Aspartate Transaminase (AST): 60 U/L — ABNORMAL HIGH (ref 15–43)
Bilirubin Total: 0.8 mg/dL (ref 0.3–1.3)
Calcium: 9.5 mg/dL (ref 8.6–10.5)
Carbon Dioxide Total: 25 mmol/L (ref 24–32)
Chloride: 105 mmol/L (ref 95–110)
Creatinine Serum: 0.96 mg/dL (ref 0.44–1.27)
E-GFR Creatinine (Female): 60 mL/min/{1.73_m2}
E-GFR, African American (Female): 69 mL/min/{1.73_m2}
Glucose: 113 mg/dL — ABNORMAL HIGH (ref 70–99)
Potassium: 4.1 mmol/L (ref 3.3–5.0)
Protein: 8.1 g/dL (ref 6.3–8.3)
Sodium: 142 mmol/L (ref 135–145)
Urea Nitrogen, Blood (BUN): 15 mg/dL (ref 8–22)

## 2019-04-20 LAB — MICROALBUMIN
Creatinine Spot Urine: 134.48 mg/dL
Microalbumin Urine: 58 mg/dL
Microalbumin/Creatinine Ratio: 431 mg/g — ABNORMAL HIGH (ref <30)

## 2019-04-20 LAB — LIPID PANEL
Cholesterol: 126 mg/dL (ref 0–200)
HDL Cholesterol: 55 mg/dL (ref >=35)
LDL Cholesterol Calculation: 46 mg/dL (ref <130)
Non-HDL Cholesterol: 71 mg/dL (ref <=150)
Total Cholesterol: HDL Ratio: 2.3 (ref <4.0)
Triglyceride Level: 124 mg/dL (ref 35–160)

## 2019-04-20 LAB — CREATININE SPOT URINE: Creatinine Spot Urine: 134.48 mg/dL

## 2019-04-20 LAB — THYROXINE, FREE (FREE T4): Thyroxine, Free (Free T4): 0.86 ng/dL (ref 0.56–1.64)

## 2019-04-20 LAB — THYROID STIMULATING HORMONE: Thyroid Stimulating Hormone: 0.92 u[IU]/mL (ref 0.35–3.30)

## 2019-04-21 LAB — HEMOGLOBIN A1C
Hgb A1C,Glucose Est Avg: 134 mg/dL
Hgb A1C: 6.3 % — ABNORMAL HIGH (ref 3.9–5.6)

## 2019-04-22 ENCOUNTER — Ambulatory Visit: Payer: Medicare Other | Admitting: GASTROENTEROLOGY

## 2019-04-27 ENCOUNTER — Encounter: Payer: Self-pay | Admitting: "Endocrinology

## 2019-04-27 ENCOUNTER — Ambulatory Visit: Payer: Medicare Other | Attending: "Endocrinology | Admitting: "Endocrinology

## 2019-04-27 DIAGNOSIS — N183 Chronic kidney disease, stage 3 unspecified: Secondary | ICD-10-CM | POA: Insufficient documentation

## 2019-04-27 DIAGNOSIS — E1122 Type 2 diabetes mellitus with diabetic chronic kidney disease: Secondary | ICD-10-CM | POA: Insufficient documentation

## 2019-04-27 DIAGNOSIS — E278 Other specified disorders of adrenal gland: Secondary | ICD-10-CM | POA: Insufficient documentation

## 2019-04-27 DIAGNOSIS — E1129 Type 2 diabetes mellitus with other diabetic kidney complication: Secondary | ICD-10-CM

## 2019-04-27 DIAGNOSIS — Z794 Long term (current) use of insulin: Secondary | ICD-10-CM | POA: Insufficient documentation

## 2019-04-27 MED ORDER — GLIPIZIDE ER 10 MG TABLET, EXTENDED RELEASE 24 HR
10.0000 mg | EXTENDED_RELEASE_CAPSULE | Freq: Every day | ORAL | 3 refills | Status: DC
Start: 2019-04-27 — End: 2019-06-01

## 2019-04-27 NOTE — Progress Notes (Signed)
Glucometer downloaded. Report given to MD.

## 2019-04-27 NOTE — Nursing Note (Signed)
Confirmed patient ID using 2 identifiers. Vital signs taken, allergies verified, screened for pain,outpatient pharmacy entered, and immunization status verified.  Ronnika Collett, LVN

## 2019-05-02 ENCOUNTER — Encounter: Payer: Self-pay | Admitting: GASTROENTEROLOGY

## 2019-05-02 NOTE — Telephone Encounter (Signed)
Will forward to MD for review and response.  Thank you. Meg Chantell Kunkler RN

## 2019-05-02 NOTE — Telephone Encounter (Signed)
From: [name]  To: Georgeanne Nim, MD  Sent: 04/29/2019 9:56 PM PST  Subject: Visit Follow-up Question    Hi Eric, hope your family has been having wonderful holidays so far. I wanted to ask you about lower abdomen/stomach. I am still having pain/discomfort(taking more Reglan) in this area, nausea with occasional vomiting(taking Zofran) and diarrhea often which is watery and contains lots of mucus(taking Immodin). Many times cannot get to bathroom in time, soiling myself. I try not to go out as I can't trust my bowels. Spent Christmas on the sofa resting between races to the bathroom. I know that I have had many tests dealing with my upper intestines would those have covered my lower? Is there blood test or anything that could be done to try to find out what is causing all these lower issues? I am sorry to bother you with this but I didn't know who else to turn to. Dr. Rosary Lively said not in her wheelhouse and I understand that. Look forward to your thoughts when you have time. [place]

## 2019-05-04 ENCOUNTER — Ambulatory Visit: Payer: Self-pay | Admitting: "Endocrinology

## 2019-05-09 ENCOUNTER — Encounter: Payer: Self-pay | Admitting: Family Medicine

## 2019-05-09 ENCOUNTER — Encounter: Payer: Self-pay | Admitting: "Endocrinology

## 2019-05-09 NOTE — Telephone Encounter (Signed)
From: [name]  To: Minna Merritts, MD  Sent: 05/08/2019 2:46 PM PST  Subject: Visit Follow-up Question    Hi Dr., hope and your family had fun and peaceful holidays. I wanted to let you know that my blood sugar levels are still low for me. Running typically between 89 and 115 with some spikes but nothing higher than 141. A couple of crashes. Had one at 1:00 am today, it dropped to 61. I was already awake taking Zofran for stomach issues. In about an hour it was 84 (after Glucose tab). Then upon waking this morning it was 122. There had been no food between 8:00 last night and about 8:30 this morning. My Lantus has been at 35 units I am going to change it 33 units tonight. Please let me know if you are ok with that and if you want me stop my Glipzide all together. Take care. See you in March.

## 2019-05-09 NOTE — Telephone Encounter (Signed)
From: [name]  To: [name], MD  Sent: 05/08/2019 2:34 PM PST  Subject: [place], hopes that you and your family had fun and peaceful holidays. I wanted to ask...how will be know when we are eligible for the Covid vaccine? Does [place] notice [place] or do we just go to [facility] and ask for it? Sorry to bother you this question but didn't know who else to ask.   Thanks for helping.

## 2019-05-13 NOTE — Progress Notes (Signed)
Endocrinology Follow up clinic note:    Chief Complaint:  "I'm here to follow up on my diabetes."    HPI: Paula Daugherty is a 74yrold female who has a diagnosis of type 2 diabetes mellitus who presents to Endocrinology for follow up. Her history is as follows:  She was initially diagnosed with diabetes around 2000 on routine labs. She has a strong family history of diabetes so she wasn't surprised at this. She was started on Metformin around 2005 and then she was started on insulin in 2014. She was up to 64 units of Lantus at night. However, after making changes to her diet and lifestyle, she was taken off insulin in 2015. Basal insulin was added back to her regimen in 2018.      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    Interim History: She returns today for follow up. Her last visit with Endocrinology was on 01/25/2019. Since this visit, she states that overall, she has been doing ok. She has noted some increased hypoglycemia in the afternoons occasionally.  She continues to deal with decreased appetite, nausea/vomiting and some lower abdominal pain. She has been following with her PCP and GI doctor for these things.     Current Regimen:              Lantus 35 units QHS              Metformin 1000 mg BID              Jardiance 25 mg daily              Glipizide XR 20 mg daily  Insulin injection technique: Holds the needle in for a few seconds with each injection  Injection sites: Abdomen  Rotates sites: Yes  Discards insulin 30 days after opening: Yes  Hypoglycemia: Yes, has dropped as low as the 50s occasional (usually in the afternoon)              Awareness: Yes, when blood glucose is in the 80s  Hyperglycemia: Almost never sees numbers >180     Meter brought today: Yes  Home blood glucose log brought to clinic today: No  Checks blood glucose: 2x/day  Home blood glucose numbers per pt's log:   Blood Glucose:                                     Pre-breakfast: 103 to 136                     Pre-lunch: 49 to 72 (only checks with symptoms of low blood sugar)             Last eye exam: 11/09/2018 no diabetic retinopathy, f/u 1 year  Foot exam: Will do today             ROS:  All other systems were reviewed and are negatative except for pertinent positive and negative responses as documented in HPI.     Medications:  Medication reconciliation was performed today.   Outpatient Medications Marked as Taking for the 04/27/19 encounter (Office Visit) with SCorky Mull MD   Medication Sig Dispense Refill    Amlodipine (NORVASC) 10 mg Tablet Take 1 tablet by mouth every day. 90 tablet 3    Atorvastatin (LIPITOR) 10 mg Tablet Take 1 tablet by  mouth every day at bedtime. 90 tablet 3    [EXPIRED] Blood Sugar Diagnostic (ONETOUCH ULTRA TEST) Strips Use to test blood glucose 2 times a day 200 strip 3    Empagliflozin (JARDIANCE) 25 mg Tablet Take 1 tablet by mouth every day. 90 tablet 3    GlipiZIDE (GLUCOTROL XL) 10 mg SR Tablet Take 1 tablet by mouth every morning with a meal. (diabetes) 90 tablet 3    Insulin Glargine (LANTUS SOLOSTAR U-100 INSULIN) 100 unit/mL (3 mL) Pen Inject 40 units subcutaneously every night at bedtime. Please provide 3 month supply. 45 mL 3    Insulin Pen Needles, Disposable, 31 gauge x 5/16" For insulin administration once a day 100 each 3    Lancing Device with Lancets (ONE TOUCH DELICA) Kit Check glucose level twice a day 1 kit 0    Losartan (COZAAR) 25 mg Tablet Take 1 tablet by mouth every day. 90 tablet 3    Metformin (GLUCOPHAGE) 1,000 mg tablet Take 1 tablet by mouth 2 times daily with meals. (diabetes) 180 tablet 3    Omeprazole (PRILOSEC) 20 mg Delayed Release Capsule Take 1 capsule by mouth two times daily before meals. 180 capsule 0    Ondansetron (ZOFRAN) 8 mg Tablet Take 1 tablet by mouth every 8 hours if needed. 270 tablet 3     I did review patient's past medical and family/social history, no changes noted.   PMH:  Past medical history  was reviewed from problem list.   Patient Active Problem List   Diagnosis    DM2 (diabetes mellitus, type 2) ([place])    Depression with anxiety    Stress at home    Leg cramps-right calf    Right ear pain    Fibromyalgia    HTN (hypertension)    GERD (gastroesophageal reflux disease)    Hyperlipidemia with target LDL less than 70    Vitamin D insufficiency    Abnormal LFTs    Renal cyst ( 6.4 cm cyst left kidney)    Cough    Fatty liver    Macroalbuminuric diabetic nephropathy (HCC)    History of actinic keratoses    Cataract    Ptosis of eyelid    Liver fibrosis    Adrenal nodule (HCC)    Medication Therapy Auth    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HCC)    Mixed conductive and sensorineural hearing loss of both ears    Neoplasm of uncertain behavior of skin    Abdominal pain, generalized    Nausea without vomiting    Asymptomatic microscopic hematuria    Subcutaneous nodule    Angina pectoris (HCC)    Mild episode of recurrent major depressive disorder (HCC)    NASH (nonalcoholic steatohepatitis)     VITAL SIGNS:  BP (P) 134/81 (SITE: left arm, Orthostatic Position: sitting, Cuff Size: regular)   Pulse (P) 65   Temp (P) 36.2 C (97.2 F) (Temporal)   Wt (P) 80.2 kg (176 lb 12.9 oz)   LMP 05/06/1983   BMI (P) 32.34 kg/m   Body mass index is 32.34 kg/m (pended).    PHYSICAL EXAM:  General Appearance: healthy, alert, no distress, pleasant affect, cooperative.  Eyes:  conjunctivae and corneas clear.  EOM's intact. sclerae normal.  Neck:  Neck supple. No adenopathy, thyroid symmetric, normal size.  Heart:  normal rate and regular rhythm, no murmurs, clicks, or gallops.  Lungs: clear to auscultation.  Abdomen: BS normal.  Abdomen soft.  No masses  or organomegaly.  Extremities:  no cyanosis, clubbing, or edema.  Skin:  Skin color, texture, turgor normal. No rashes or lesions.  Neuro: Gait normal. No tremor appreciated. Sensation and strength grossly normal.  Mental Status:  Appearance/Cooperation: in no apparent distress and well developed and well nourished  Eye Contact: normal  Speech: normal volume, rate, and pitch    LAB TESTS/STUDIES:   I personally reviewed the following laboratory studies.     12/23/2018 10:10 04/20/2019 10:29   SODIUM 140 142   POTASSIUM 4.1 4.1   CHLORIDE 105 105   CARBON DIOXIDE TOTAL 23 (L) 25   UREA NITROGEN, BLOOD (BUN) 16 15   CREATININE BLOOD 0.91 0.96   GLUCOSE 120 (H) 113 (H)   CALCIUM 8.9 9.5   PROTEIN  8.1   ALBUMIN  3.9   ALKALINE PHOSPHATASE (ALP)  112   ASPARTATE TRANSAMINASE (AST)  60 (H)   BILIRUBIN TOTAL  0.8   ALANINE TRANSFERASE (ALT)  43   E-GFR, African American (Female) 74 79   E-GFR, Non-African American (Female) 64 60   CHOLESTEROL 138 126   TRIGLYCERIDE 139 124   LDL CHOLESTEROL CALCULATION 58 46   HDL CHOLESTEROL 52 55   NON-HDL CHOLESTEROL 86 71   TOTAL CHOLESTEROL:HDL RATIO 2.7 2.3   THYROID STIMULATING HORMONE  0.92   THYROXINE, FREE (FREE T4)  0.86   HGB A1C 6.7 (H) 6.3 (H)   HGB A1C,GLUCOSE EST AVG 146 134      12/23/2017 09:23   White Blood Cell Count 6.9   Hemoglobin 12.6   Hematocrit 39.8   MCV 76.3 (L)   RDW 17.8 (H)   Platelet Count 235      04/19/2018 12:17 04/20/2019 11:02   CREATININE SPOT URINE 103.66 134.48   METANEPHRINES FRACTIONATED,UR     MICROALBUMIN URINE 36.5 58.0   MICROALBUMIN/CREATININE RATIO 352 (H) 431 (H)     12/23/2017:    Ref Range & Units      TOTAL VOLUME  mL  1750     TIME OF COLLECTION  hr  24     DOPAMINE,UR PER VOLUME  ug/L  111     DOPAMINE,UR PER 24HR  56 - 272 ug/d  194     DOPAMINE,UR RATIO TO CRT  0 - 250 ug/g CRT  182     NOREPINEPHRINE,UR PER VOLUME  ug/L  33     NOREPINEPHRINE,UR PER 24HR  11 - 60 ug/d  58     NOREPINEPHRINE,UR RATIO TO CRT  0 - 45 ug/g CRT  54High       EPINEPHRINE,UR PER VOLUME  ug/L  1     EPINEPHRINE,UR PER 24 HR  1 - 14 ug/d  2     EPINEPHRINE,UR RATIO TO CRT  0 - 20 ug/g CRT  2         Ref Range & Units      TOTAL VOLUME  mL  1750     TIME OF COLLECTION  hr  24      CORTISOL,UR FREE PER VOLUME  ug/L  19.50     CORTISOL,UR FREE PER 24HR  <=45.0 ug/d  34.1     CORTISOL,UR FREE RATIO TO CRT  ug/g CRT  31.97         Ref Range & Units      TOTAL VOLUME  mL  1750     TIME OF COLLECTION  hr  24     METANEPHRINE,UR-PER VOLUME  ug/L  65     METANEPHRINE,UR-PER 24HR  39 - 143 ug/d  114     METANEPHRINE,UR-RATIO TO CRT  0 - 300 ug/g CRT  108     NORMETANEPHRINE,UR-PER VOLUME  ug/L  265     NORMETANEPHRINE,UR-PER 24HR  109 - 393 ug/d  464High       NORMETANEPHRIN,UR-RATIO TO CRT  0 - 400 ug/g CRT  442High       METANEPHRINES INTERPRETATION    See Note          Ref Range & Units      Urine Volume  mL  1,750     START DATE    12/22/17     START TIME    7:45 AM     STOP DATE    12/23/17     STOP TIME    7:45 AM     COLLECTION INTERVAL, URINE  H  24.00     Creatinine 24 Hr  800-2,000 mg/24Hr  1,058        CT Abdomen (04/08/2017):  FINDINGS:  URINARY COLLECTING SYSTEM:  Renal stones: None.  Ureteral stones: None.  Bladder stones: None.  Solid / enhancing renal mass: None. Left interpolar cyst with thin  peripheral calcification. Additional subcentimeter hypodensities, too small  to characterize but statistically likely benign.  Renal enhancement: Normal symmetric enhancement of the kidneys.  There is segmental nonopacification of the bilateral distal ureters.  Hydronephrosis: None.  Hydroureter: None.  Renal collecting system: No intraluminal filling defects.  Ureters: No intraluminal filling defects in the opacified segments of the  ureters. Segments of the ureter that are not opacified do not show any wall  thickening, suspicion of an enhancing mass, or focal or segmental  dilatation.  Urinary bladder: No focal or asymmetric bladder wall thickening, focal  filling defects, or enhancing polypoid lesions.     REMAINING ABDOMEN AND PELVIS:  Lower Chest: Heavy coronary artery calcification. Bibasilar atelectasis.  Liver: Unchanged hepatic steatosis and hepatomegaly.  Bile Ducts:  Unremarkable.  Gallbladder: Unremarkable.  Pancreas: Unremarkable.  Spleen: Unremarkable.  Adrenal Glands: Unchanged 1.8 cm left adenoma.  GI Tract: Diverticulosis without evidence of acute diverticulitis.  Peritoneal Cavity: No free fluid or free air.  Uterus and Ovaries: Status post hysterectomy. No adnexal masses.  Lymph Nodes: Unchanged 1.3 cm porta hepatic lymph node. Unchanged  additional smaller upper abdominal lymph nodes.  Major Vascular Structures: Moderate aortoiliac artery calcification.  Soft Tissues: Unchanged partially calcified left gluteal soft tissue  densities, likely injection granulomas.  Musculoskeletal: No suspicious bony lesions or acute fractures. Mild  multilevel degenerative changes of the visualized spine.     IMPRESSION:  1. No cause for hematuria. No urolithiasis, renal mass, or  uroepithelial mass in the kidneys, ureters, or urinary bladder.  2. No hydronephrosis or hydroureter.  3. Unchanged 1.8 cm left adrenal adenoma.    Impression: This is a 74yrold female with Diabetes Mellitus Type II who presents to Endocrinology for follow up. Overall, her glycemic control is at goal as evidenced by her most recent A1c of 6.3%.  Review of her blood glucose meter download today demonstrates that her blood glucose is doing ok in the morning but is dropping as low as the 40s in the afternoon. Because of this, I have recommended a decrease in glipizide dose.   She was encouraged to report any hypoglycemia or persistent hyperglycemia prior to follow up.      She also has a history of an  adrenal nodule. Hormonal evaluation was normal in 12/2016 and she will be due for repeat hormonal evaluation now.     DIABETES HISTORY  (-) h/o retinopathy.      (-) h/o microalbuminuria/nephropathy.  (-) h/o neuropathy.  (-) h/o Autonomic dysfunction  (-) h/o Gastroparesis  (-) h/o CAD  (-) h/o PVD     Last eye exam: 11/2018, f/u 1year  Last urine microalbumin: 04/2019, 431  Pneumovax: 12/2014, Prevnar 2018  Influenza  vaccine: 12/2018  (-) ASA  (+) Statin  (+) ACE/ARB          Recommendations:  (E11.29) Type 2 diabetes mellitus with other diabetic kidney complication  (primary encounter diagnosis)  - Continue current dose of metformin, Jardiance, and Lantus  - Decrease glipizide to 10 mg XR daily  - Encouraged dietary changes and increased exercise  - Encouraged patient to continue blood glucose monitoring to at least 1x/day, goal to check 2x/day  - Last LDL at goal, continue statin, repeat due 04/2020  - Last microalbumin:creatinine ratio elevated, continue cozaar, repeat due 04/2020  - Up to date on screening eye exam, next due 11/2019  - Repeat A1c prior to follow up appointment     2. Adrenal nodule  - Biochemical evaluation normal in 12/2017, stability by imaging in 04/2017  - Repeat hormonal evaluation due now (~1 year from previous) for 5 years total (2022)      Approximately 30 minutes were spent with patient, greater than 50% of which was spent counseling the patient on diabetes management and on coordination of care.    Follow up in 3 months    If you have any questions, please do not hesitate to contact me at [phone].  Thank you for allowing me to participate in the care of this patient.    EDUCATION:  I educated/instructed the patient or caregiver regarding all aspects of the above stated plan of care.  The patient or caregiver indicated understanding.      [place] interpreter was not used.    Report electronically signed by:  Sunday Spillers, M.D.  Clinical Assistant Professor  Department of Endocrinology  Pager # [phone]    Patient WAS wearing a surgical mask  Droplet precautions were followed when caring for the patient.   PPE used by provider during encounter: Surgical mask and Face Shield/Goggles

## 2019-05-18 ENCOUNTER — Encounter: Payer: Self-pay | Admitting: Family Medicine

## 2019-05-30 ENCOUNTER — Other Ambulatory Visit: Payer: Self-pay | Admitting: Family Medicine

## 2019-05-30 ENCOUNTER — Encounter: Payer: Self-pay | Admitting: "Endocrinology

## 2019-05-30 NOTE — Telephone Encounter (Signed)
From: [name]  To: Minna Merritts, MD  Sent: 05/29/2019 11:53 AM PST  Subject: Visit Follow-up Question    Hi Dr., hope all remains well with you and your family. I wanted to share my blood sugar numbers since stopping the Glipzide. They now arrange from a low of 130 to high 140s. On occasion they hit higher. My insulin dose remains at 33 units per night. I have had no crashes since stopping the Glipzide. Wanted to keep you posted. Just let me know if you want me to make any changes. I see Dr [name] for my Annual Wellness exam on Wednesday.   Take care

## 2019-06-01 ENCOUNTER — Ambulatory Visit (INDEPENDENT_AMBULATORY_CARE_PROVIDER_SITE_OTHER)
Admission: RE | Admit: 2019-06-01 | Discharge: 2019-06-01 | Disposition: A | Discharge status: Home / Self Care | Payer: Medicare Other | Source: Ambulatory Visit

## 2019-06-01 ENCOUNTER — Ambulatory Visit (INDEPENDENT_AMBULATORY_CARE_PROVIDER_SITE_OTHER): Payer: Medicare Other

## 2019-06-01 ENCOUNTER — Ambulatory Visit (INDEPENDENT_AMBULATORY_CARE_PROVIDER_SITE_OTHER)
Admission: RE | Admit: 2019-06-01 | Discharge: 2019-06-01 | Disposition: A | Discharge status: Home / Self Care | Payer: Medicare Other | Source: Ambulatory Visit | Attending: Family Medicine | Admitting: Family Medicine

## 2019-06-01 ENCOUNTER — Ambulatory Visit
Admission: RE | Admit: 2019-06-01 | Discharge: 2019-06-01 | Disposition: A | Discharge status: Home / Self Care | Payer: Medicare Other | Source: Ambulatory Visit | Attending: Family Medicine | Admitting: Family Medicine

## 2019-06-01 ENCOUNTER — Ambulatory Visit (INDEPENDENT_AMBULATORY_CARE_PROVIDER_SITE_OTHER): Payer: Medicare Other | Admitting: Family Medicine

## 2019-06-01 ENCOUNTER — Encounter: Payer: Self-pay | Admitting: Family Medicine

## 2019-06-01 VITALS — BP 137/86 | HR 67 | Temp 97.7°F | Resp 18 | Ht 62.72 in | Wt 175.7 lb

## 2019-06-01 DIAGNOSIS — R140 Abdominal distension (gaseous): Secondary | ICD-10-CM

## 2019-06-01 DIAGNOSIS — I209 Angina pectoris, unspecified: Secondary | ICD-10-CM

## 2019-06-01 DIAGNOSIS — R718 Other abnormality of red blood cells: Secondary | ICD-10-CM

## 2019-06-01 DIAGNOSIS — M19032 Primary osteoarthritis, left wrist: Secondary | ICD-10-CM

## 2019-06-01 DIAGNOSIS — M180 Bilateral primary osteoarthritis of first carpometacarpal joints: Secondary | ICD-10-CM

## 2019-06-01 DIAGNOSIS — E278 Other specified disorders of adrenal gland: Secondary | ICD-10-CM

## 2019-06-01 DIAGNOSIS — M19041 Primary osteoarthritis, right hand: Secondary | ICD-10-CM

## 2019-06-01 DIAGNOSIS — M6530 Trigger finger, unspecified finger: Secondary | ICD-10-CM

## 2019-06-01 DIAGNOSIS — M6740 Ganglion, unspecified site: Secondary | ICD-10-CM

## 2019-06-01 DIAGNOSIS — E1121 Type 2 diabetes mellitus with diabetic nephropathy: Secondary | ICD-10-CM

## 2019-06-01 DIAGNOSIS — Z1231 Encounter for screening mammogram for malignant neoplasm of breast: Secondary | ICD-10-CM

## 2019-06-01 DIAGNOSIS — Z794 Long term (current) use of insulin: Secondary | ICD-10-CM

## 2019-06-01 DIAGNOSIS — I10 Essential (primary) hypertension: Secondary | ICD-10-CM

## 2019-06-01 DIAGNOSIS — F330 Major depressive disorder, recurrent, mild: Secondary | ICD-10-CM

## 2019-06-01 DIAGNOSIS — C7A8 Other malignant neuroendocrine tumors: Secondary | ICD-10-CM

## 2019-06-01 LAB — COMPREHENSIVE METABOLIC PANEL
Alanine Transferase (ALT): 51 U/L (ref 5–54)
Albumin: 3.9 g/dL (ref 3.2–4.6)
Alkaline Phosphatase (ALP): 100 U/L (ref 35–115)
Aspartate Transaminase (AST): 70 U/L — ABNORMAL HIGH (ref 15–43)
Bilirubin Total: 1.1 mg/dL (ref 0.3–1.3)
Calcium: 9.6 mg/dL (ref 8.6–10.5)
Carbon Dioxide Total: 25 mmol/L (ref 24–32)
Chloride: 104 mmol/L (ref 95–110)
Creatinine Serum: 1.02 mg/dL (ref 0.44–1.27)
E-GFR Creatinine (Female): 55 mL/min/{1.73_m2}
E-GFR, African American (Female): 64 mL/min/{1.73_m2}
Glucose: 121 mg/dL — ABNORMAL HIGH (ref 70–99)
Potassium: 4.3 mmol/L (ref 3.3–5.0)
Protein: 8.1 g/dL (ref 6.3–8.3)
Sodium: 142 mmol/L (ref 135–145)
Urea Nitrogen, Blood (BUN): 15 mg/dL (ref 8–22)

## 2019-06-01 LAB — URINALYSIS AND CULTURE IF IND
Bilirubin Urine: NEGATIVE
Glucose Urine: >=500 mg/dL — AB
Ketones: NEGATIVE mg/dL
Nitrite Urine: NEGATIVE
Occult Blood Urine: NEGATIVE mg/dL
Protein Urine: 100 mg/dL — AB
RBC: 4 /[HPF] — ABNORMAL HIGH (ref 0–2)
Renal EPI: <1 /[HPF] (ref 0–2)
Specific Gravity, Urine: 1.025 (ref 1.002–1.030)
Squamous EPI: 2 /[HPF] (ref <=10)
Trans Epi: 2 /[HPF] (ref 0–2)
Urobilinogen: NEGATIVE mg/dL (ref ?–2.0)
WBC, Urine: 11 /[HPF] — ABNORMAL HIGH (ref 0–5)
pH URINE: 5 (ref 4.8–7.8)

## 2019-06-01 LAB — CBC WITH DIFFERENTIAL
Basophils % Auto: 0.8 %
Basophils Abs Auto: 0.1 10*3/uL (ref 0.0–0.2)
Eosinophils % Auto: 2.8 %
Eosinophils Abs Auto: 0.2 10*3/uL (ref 0.0–0.5)
Hematocrit: 37.4 % (ref 36.0–46.0)
Hemoglobin: 12.2 g/dL (ref 12.0–16.0)
Lymphocytes % Auto: 26.1 %
Lymphocytes Abs Auto: 2 10*3/uL (ref 1.0–4.8)
MCH: 25.3 pg — ABNORMAL LOW (ref 27.0–33.0)
MCHC: 32.7 % (ref 32.0–36.0)
MCV: 77.2 fL — ABNORMAL LOW (ref 80.0–100.0)
MPV: 8.5 fL (ref 6.8–10.0)
Monocytes % Auto: 7.3 %
Monocytes Abs Auto: 0.6 10*3/uL (ref 0.1–0.8)
Neutrophils % Auto: 63 %
Neutrophils Abs Auto: 4.9 10*3/uL (ref 1.8–7.7)
Platelet Count: 184 10*3/uL (ref 130–400)
RDW: 17.2 % — ABNORMAL HIGH (ref 0.0–14.7)
Red Blood Cell Count: 4.84 10*6/uL (ref 4.00–5.20)
White Blood Cell Count: 7.8 10*3/uL (ref 4.5–11.0)

## 2019-06-01 LAB — LIPASE: Lipase: 37 U/L (ref 13–51)

## 2019-06-01 MED ORDER — LOSARTAN 25 MG TABLET
25.0000 mg | ORAL_TABLET | Freq: Every day | ORAL | 1 refills | Status: DC
Start: 2019-06-01 — End: 2019-12-14

## 2019-06-01 NOTE — Nursing Note (Signed)
Patient roomed, chief complaint noted, allergies verified, blood pressure, pulse, respiration, temperature, and weight obtained, screened for pain, and pharmacy verified.    2 Paula Daugherty, M.A.    PPE Used During the Visit:    Eye and Face Protection:          Body Protection:   []Glasses/Goggles    []Gown   [x]Face Shield   [x]Surgical Mask   Hand Protection:   []N-95 Mask     []Gloves   []Particulate Respirator   []Half/Full Face Elastomeric Respirator   []PAPR    Pat ient:    [x]Surgical Mask  [name], M.A.

## 2019-06-01 NOTE — Progress Notes (Signed)
Paula Daugherty is a 44yrfemale who presents with a chief complaint of "I have list".    Chief Complaint   Patient presents with    General Exam     CME       1. Bloating: seems generalized and along pelvic area; some nausea; occasional looser stools; seen by DR CVirgel Bouquet we discussed rechecking labs and ultrasound for now   2. Stress at home: feels like she has to do more when it comes to planning trips with spouse and thinking for both of them; daughter won't speak to her or let her see grandson; feels discouraged   3. Possible trigger finger: right middle finger it catches; declined seeing ortho but is ok to get xray done       ROS:   Constitutional: negative for fatigue, night sweats, malaise, anorexia, fever; recent increased weight.   Eyes: negative for blurry vision, double vision, visual loss, red eyes, photophobia, eye pain; seeing eye provider regularly   Ears, Nose, Mouth, Throat: negative for difficulty swallowing, persistent sore throat, hoarseness, dentures, post nasal drip, nosebleeds, rhinorrhea, loss of sense of smell, hearing loss, ear pain, tinnitus, discharge from ears, dental problem, change in taste, odynophagia.   CV: negative for palpitations, syncope, chest pain, paroxysmal nocturnal dyspnea, orthopnea, lower extremity edema, cyanosis, claudication.   Resp: negative for cough, sputum, hemoptysis, shortness of breath, excessive snoring, apnea spells while sleeping, pleuritic pain, wheezing, dyspnea on exertion.   GI: negative for dysphagia, heartburn or reflux, early satiety, hematemesis, change in caliber of stool, hemorrhoids, bleeding from rectum, melena, hematochezia, constipation, diarrhea, jaundice.   GU: negative for nocturia, dysuria, decreased force of stream, frequency, hesitancy, hematuria, retention, incontinence.   Musculoskeletal: negative for AM joint stiffness, joint swelling, joint pain, joint redness, back pain, muscle weakness, muscle pain, muscle cramping.   Integumentary:  negative for moles that have changed, dark lesions, rash, itching, bruising, lumps or bumps, hair loss.   Neuro: negative for confusion, headaches, feel faint, seizures, vertigo, memory loss, paralysis/weakness, numbness or tingling, clumsiness, tremor, speech impairment.   Psych: see HPI; no plan to hurt self or others   Endo: negative for cold intolerance, heat intolerance, polyphagia, polydipsia, polyuria, hair loss, excessive hair growth.   Heme/Lymphatic: negative for recent anemia, bleeding disorder, abnormal bleeding, abnormal bruising, swollen nodes.   Allergy/Immun: negative for hay fever, itchy eyes, itchy nose, clear nasal secretions, rash.   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 90 tablet 3    Atorvastatin (LIPITOR) 10 mg Tablet Take  1 tablet by mouth every day at bedtime. 90 tablet 3    Empagliflozin (JARDIANCE) 25 mg Tablet Take 1 tablet by mouth every day. 90 tablet 3    GlipiZIDE (GLUCOTROL XL) 10 mg SR Tablet Take 1 tablet by mouth every morning with a meal. (diabetes) 90 tablet 3    Insulin Glargine (LANTUS SOLOSTAR U-100 INSULIN) 100 unit/mL (3 mL) Pen Inject 40 units subcutaneously every night at bedtime. Please provide 3  month supply. 45 mL 3    Insulin Pen Needles, Disposable, 31 gauge x 5/16" For insulin administration once a day 100 each 3    Metformin (GLUCOPHAGE) 1,000 mg tablet Take 1 tablet by mouth 2 times daily with meals. (diabetes) 180 tablet 3    Omeprazole (PRILOSEC) 20 mg Delayed Release Capsule Take 1 capsule by mouth two times daily before meals. 180 capsule 0    Ondansetron (ZOFRAN) 8 mg Tablet Take 1 tablet by mouth every 8 hours if needed. 270 tablet 3     No current facility-administered medications on file prior to visit.      Allergies:   Allergies   Allergen Reactions    Contrast Dye [Radiopaque Agent] Hives    Hydrochlorothiazide Other-Reaction in Comments     Patient reports    Januvia [Sitagliptin] Other-Reaction in Comments     Patient reports    Lyrica [Pregabalin] Other-Reaction in Comments     Patient reports    Omnipaque [Iohexol] Other-Reaction in Comments     Patient reports    Prozac [Fluoxetine Hcl] Other-Reaction in Comments     Patient reports    Sulfa (Sulfonamide Antibiotics) Rash    Topiramate Other-Reaction in Comments     Hairfall       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 [employer]    Social Needs    Financial resource strain: Not on file    Food insecurity     Worry: Not on file     Inability: Not on file    Transportation needs     Medical: Not on file     Non-medical: Not on file   Tobacco Use    Smoking status: Former Smoker     Packs/day: 0.10     Years: 20.00     Pack years: 2.00    Smokeless tobacco: Never Used    Tobacco comment: quit 30 years ago   Substance and Sexual Activity    Alcohol use: Yes     Alcohol/week: 0.0 standard drinks     Comment: rare    Drug use: No    Sexual activity: Yes     Partners: Male   Lifestyle    Physical  activity     Days per week: Not on file     Minutes per session: Not on file    Stress: Not on file   Relationships    Social connections     Talks on phone: Not on file     Gets together: Not on file     Attends religious service: Not on file     Active member of club or organization: Not on file     Attends meetings  of clubs or organizations: Not on file     Relationship status: Not on file    Intimate partner violence     Fear of current or ex partner: Not on file     Emotionally abused: Not on file     Physically abused: Not on file     Forced sexual activity: Not on file   Other Topics Concern    Not on file   Social History Narrative    Not on file     Family History   Problem Relation Name Age of Onset    Diabetes Father      Heart Mother          MI at 82s     Non-contributory Brother      Non-contributory Brother         PE:  BP 137/86 (SITE: right arm, Orthostatic Position: sitting, Cuff Size: regular)   Pulse 67   Temp 36.5 C (97.7 F) (Temporal)   Resp 18   Ht 1.593 m (5' 2.72")   Wt 79.7 kg (175 lb 11.3 oz)   LMP 05/06/1983   BMI 31.41 kg/m   General Appearance: healthy, alert, no distress, pleasant affect, cooperative.  Eyes:  conjunctivae and corneas clear. PERRL, EOM's intact. sclerae normal.  Ears:  normal TMs and canal and external inspection of ears show no abnormality.  Nose:  nasal mucosa normal.  Mouth: good dentition; no lesions.  Neck:  Neck supple. No adenopathy, thyroid symmetric, normal size.  Heart:  normal rate and regular rhythm, no murmurs, clicks, or gallops.  Lungs: clear to auscultation.  Abdomen: BS normal.  Abdomen soft, non-tender.  No masses or organomegaly.  Extremities:  no cyanosis, clubbing, or edema.  Skin:  Skin color, texture, turgor normal. No rashes or lesions.  Neuro: grossly intact.  Musculoskeletal: normal tone; right middle finger without obvious findings; left wrist with possible ganglion cyst  Breast:  normal in size and symmetry, normal contour  with no evidence of flattening or dimpling, skin normal, nipples everted without rashes or discharge, palpation negative for masses or nodules, no LAD; mammogram done this AM is pending .    Assessment and Plan:  (R14.0) Abdominal bloating  (primary encounter diagnosis)  Comment: that seems generalized and at pelvis x 6 mos; new problem uncertain diagnosis or prognosis; IBS is possible; seeing GI and to include ultrasound and labs to further evaluation   Plan: US PELVIS, TRANSVAG + US PELVIS, TRANSABD,         NON-OB, COMPLETE, US ABDOMEN, COMPLETE,         Comprehensive Metabolic Panel, CBC with         Differential, TSH with Free T4 Reflex,         Urinalysis and Culture if Ind, Lipase          (M65.30) Trigger finger of right hand, unspecified finger  Comment: suspected trigger finger right middle finger   Plan: FINGER, RIGHT          (M67.40) Ganglion cyst  Comment: suspected ganglion cyst left wrist   Plan: WRIST 3+ VIEWS, LEFT        Reassurance given     (I20.9) Angina pectoris (HCC)  Comment: history of this; no current symptoms   Plan: to monitor     (E27.8) Adrenal nodule (HCC)  Comment: Unchanged 1.8 cm left adrenal adenoma per last CT   Plan: to consider repeat CT if bloating continues     (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)  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[place])  Comment: seeing DR Virgel Bouquet   Plan: continue follow-up with DR Chak     (E11.21,  N18.31,  Z79.4) Type 2 diabetes mellitus with stage 3a chronic kidney disease, with long-term current use of insulin (HCC)  Comment: at goal   Plan: increased activity encouraged        (I10) Essential hypertension  Comment: at goal; needs refill  Plan: Losartan (COZAAR) 25 mg Tablet               (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 No guarantees were made regarding her medical care or treatment outcome. Barriers to Learning: none.  Patient verbalizes understanding of teaching and instructions.    Electronically signed by:    [name], MD  [place], [place] Board of [employer] Medicine  Associate physician [facility], [place]   [phone]

## 2019-06-01 NOTE — Patient Instructions (Addendum)
Please see about possibly getting Shingrix from outside pharmacy--repeat due after 2 mos with 1 booster to complete series of 2.     A laboratory test has been ordered for you.  No fasting--avoid release of A1c now but is due 3/17.     An X-ray has been ordered for you. Please go to the front desk at [address] [place] to obtain your x-ray. You will receive a letter or telephone call with your results.    An ultrasound has been ordered for you Please call [phone] to schedule an appointment in [place]. You will receive a letter or telephone call with your results.     - - - - - - - - - - Information on GYNECOLOGIC CANCERS - - - - - - - - - -    What Women Need to Know    What are Women's Reproductive Cancers?    These are cancers of a woman's reproductive sex organs.  They are also called gynecologic cancers.  They include cancer of the:  -Vulva (lips around the opening of the vagina)  -Vagina (the birth canal)  -Cervix (The opening to the womb)  -Uterus (the womb)  -Fallopian Tubes (carry eggs to the womb)  -Ovaries (hold the eggs)    Who is at risk?  -All women have some risk.  Read this handout and learn more about:  your risk signs of cancer and ways of finding the cancer early    [place]    More women die of this cancer than from any other reproductive cancer.    You have MORE risk for cancer of the ovaries if:  -You are over 37 years of age  -15 in your family has had cancer of the ovaries or breast  -You have had breast cancer  -You have not had any children  -You have used hormones (for menopause) for over 10 years    You have LESS risk for cancer of the ovaries if:  -You have use the pill (birth control pills)  for more than 5 years  -You have breast fed your babies    WHAT YOU SHOULD LOOK FOR:  Many women have no symptoms.  Sometimes it is hard to tell that symptoms are related to the ovaries.  Some women have:  -Pain, a full feeling, or a lump in the belly that doesn't  go away  -Bleeding from the vagina that is not normal  -Stomach problems that do not go away, like gas, nausea or discomfort in the lower belly  - Or pain when having sex    CANCER OF THE UTERUS    Cancer of the uterus is the most common women's reproductive cancer.  This cancer is also called endometrial cancer and it usually starts in the lining of the womb.    You have MORE risk for uterine cancer if:  -You are over 43 years of age  -You have too much body fat, have diabetes or high blood pressure  -You are taking some forms of hormones for menopause  -You have not had children  -Your menopause started after age 61  -You take tamoxifen, or certain other treatments for breast cancer    WHAT YOU SHOULD LOOK FOR:  Some women have:  -Bleeding or discharge from the vaginal that is not normal  -A full feeling or cramps in the belly that does not go away  -Unexplained weight gain or weight loss  CANCER OF THE CERVIX    Cancer of the cervix can be prevented with regular PAP tests.  The PAP test can find cells that are not normal before they become cancer.    You have MORE risk for cervical cancer if:  -You do not get regular PAP smears and pelvic exams  -You or your sex partner have had many sex partners  -You had sex at an early age  -You have had genital warts (HPV infection)  -You smoke cigarettes    WHAT YOU SHOULD LOOK FOR:  -Bleeding, spotting or discharge from the vagina that is not normal  -Bleeding after having sex  -Pain when having sex    LESS COMMON CANCERS    Cancers of the vagina, vulva and fallopian tubes are not at all common.    You have a RISK for these cancers if:  -You are over 16 years of age  -You have had cancer of the cervix, or other reproductive cancers  -You have had genital warts (also called HPV)    THE BEST WAY TO PROTECT YOURSELF IS TO FIND A CANCER EARLY!    What you can do to protect yourself:  -Get yearly pelvic exams  -Get PAP tests at appropriate intervals  -Talk to your health care  provider about your personal risks  -Report any warning signs or changes you have noticed    Cancer of the ovaries is hard to find.  Talk to your provider about all of your symptoms, even if they do not seem to be related to your ovaries.  Your provider may offer special testing if you are determined to be at risk for ovarian cancer.    WHAT ELSE CAN YOU DO TO STAY HEALTHY:  -Eat 5 servings of fruits and vegetables every day  -Exercise 30 minutes every day  -Stay at a healthy weight  -Practice safer sex:  use a condom.

## 2019-06-02 ENCOUNTER — Ambulatory Visit: Payer: Medicare Other | Attending: Family Medicine

## 2019-06-02 DIAGNOSIS — Z23 Encounter for immunization: Secondary | ICD-10-CM

## 2019-06-02 NOTE — Progress Notes (Signed)
The patient is eligible to receive the COVID-19 Vaccine at this time: Yes    The patient has received a COVID vaccine previously: No      The patient acknowledges receipt of the Fact Sheet for Recipients and Caregivers for the Pfizer-BioNtech vaccine, which includes information about the risks and benefits of the vaccine and available alternatives. The patient was informed the FDA has authorized the emergency use of the COVID-19 Vaccine, which is not an FDA-approved vaccine and that the patient may accept or refuse the vaccine.    Pre-Screening Documentation:      COVID-19 Immunization Allergies  1. Have you ever had a severe allergic reaction to any component of the COVID-19 Vaccine? (Review ingredients in the Fact Sheet for Recipients and Caregivers): No  2. Did you have an immediate allergic reaction (within 4 hours) of receiving a previous dose of the COVID-19 Vaccine?: Not Applicable  3. Have you ever had an immediate allergic reaction of any severity to polysorbate?: No  Did the patient answer yes to any of questions 1-3?: No (proceed to question 4)      COVID-19 Immunization Clinical Considerations  4.Are you pregnant or breastfeeding?: No  5.Do you have a weakened immune system caused by something such as HIV infection or cancer, or do you take medications that affect the immune system?: No  6. Do you have a bleeding disorder or are you taking blood thinners? : No  Did the patient answer yes to any of questions 4-6? : No (proceed to question 7)      COVID-19 Immunization Deferral Considerations  7. Have you received any other vaccines within the past 14 days? : No  8. Have you tested positive for COVID-19 in the last 14 days? : No  9. Have you received a monoclonal antibody or convalescent plasma treatment for COVID-19 in the past 90 days? : No  10. Have you had a confirmed COVID-19 exposure in the past 14 days?: No  11. Are you feeling sick today? : No  Did the patient answer yes to any of questions  7-11?: No (proceed to question 12)      COVID-19 Immunization Observation Considerations  12. Do you have a history of immediate allergic reaction of any severity to a vaccine?: No  13. Do you have a history of immediate allergic reaction of any severity to an injectable medication?: Yes  If yes, please explain (what medication, level of severity, etc): IV Dye- Facial swelling. Throat swelling  14. Do you have a history of severe allergic reaction due to any cause? : Yes  If yes, please explain (cause, level of severity, etc): Hair Dye- Severe facial swelling.  Did the patient answer yes to any of questions 12-14? : Yes  If yes, patient informed of risk of severe allergic reaction due to history of reaction. This is not a contraindication, but a 30-min observation is recommended due to the risk of severe allergic reaction to the vaccine.: The patient acknowledges the risk of severe allergic reaction and desires to receive the vaccine today.        All patient questions were answered and the patient agrees to receive the COVID-19 vaccine today.     Vaccine prepared and administered according to the current Emergency Use Authorization and Fact Sheet for Healthcare Providers Administering Vaccine.    Patient given vaccine card and discharge instructions with information about the Fact Sheet for Recipients and Caregivers and the v-safe program. Patient directed to observation  area.     Paula Daugherty    Patient WAS wearing a surgical mask  Droplet precautions were followed when caring for the patient.   PPE used by provider during encounter: Surgical mask, Face Shield/Goggles and Gloves

## 2019-06-03 ENCOUNTER — Encounter: Payer: Self-pay | Admitting: Family Medicine

## 2019-06-03 DIAGNOSIS — M6530 Trigger finger, unspecified finger: Secondary | ICD-10-CM

## 2019-06-03 DIAGNOSIS — M6740 Ganglion, unspecified site: Secondary | ICD-10-CM

## 2019-06-03 LAB — CULTURE URINE, BACTI: URINE CULTURE: NO GROWTH

## 2019-06-03 NOTE — Telephone Encounter (Signed)
From: [name]  To: [name], MD  Sent: 06/02/2019 5:53 PM PST  Subject: Test Result Question    I received your assessment of my x-rays. I did not mean that I was declining an ortho consult for my finger. I only meant that I didn't want needles stuck into my finger. If you feel that ortho may have other options then I am fine with a referral. Thank you     Also any concerns from lab results? There was one test not done as insurance said they would not cover. I think it was a thyroid test.

## 2019-06-03 NOTE — Addendum Note (Signed)
Addended by: [name] on: 06/03/2019 10:28 AM     Modules accepted: Orders

## 2019-06-07 ENCOUNTER — Ambulatory Visit
Admission: RE | Admit: 2019-06-07 | Discharge: 2019-06-07 | Disposition: A | Discharge status: Home / Self Care | Payer: Medicare Other | Source: Ambulatory Visit | Attending: Family Medicine | Admitting: Family Medicine

## 2019-06-07 ENCOUNTER — Ambulatory Visit (INDEPENDENT_AMBULATORY_CARE_PROVIDER_SITE_OTHER)
Admission: RE | Admit: 2019-06-07 | Discharge: 2019-06-07 | Disposition: A | Discharge status: Home / Self Care | Payer: Medicare Other | Source: Ambulatory Visit | Attending: Family Medicine | Admitting: Family Medicine

## 2019-06-07 DIAGNOSIS — Z90711 Acquired absence of uterus with remaining cervical stump: Secondary | ICD-10-CM

## 2019-06-07 DIAGNOSIS — K760 Fatty (change of) liver, not elsewhere classified: Secondary | ICD-10-CM

## 2019-06-07 DIAGNOSIS — R140 Abdominal distension (gaseous): Secondary | ICD-10-CM

## 2019-06-07 DIAGNOSIS — R102 Pelvic and perineal pain: Secondary | ICD-10-CM

## 2019-06-07 DIAGNOSIS — R9389 Abnormal findings on diagnostic imaging of other specified body structures: Secondary | ICD-10-CM

## 2019-06-07 DIAGNOSIS — R160 Hepatomegaly, not elsewhere classified: Secondary | ICD-10-CM

## 2019-06-08 ENCOUNTER — Encounter: Payer: Self-pay | Admitting: "Endocrinology

## 2019-06-08 NOTE — Telephone Encounter (Signed)
From: [name]  To: Minna Merritts, MD  Sent: 06/07/2019 6:38 PM PST  Subject: Test Result Question    Hello Dr, I had my ultra sounds this morning. I asked them to send you the results as well. The abdominal one showed many things. One is "multiple cysts" on my left kidney. I had only been aware of one previously. Also appears liver damage is getting worse. Do you have concerns? Dr. [name] recommends weight loss. If only I could get that to happen. lol. Anyway I look forward to your thoughts as well. Hope you and family remain well.     Paula Daugherty

## 2019-06-13 MED ORDER — ONETOUCH ULTRA BLUE TEST STRIP: ORAL_STRIP | 3 refills | Status: DC

## 2019-06-13 NOTE — Telephone Encounter (Signed)
Refill authorized per P&T Protocol 06/13/2019

## 2019-06-13 NOTE — Addendum Note (Signed)
Addended by: Awilda Bill on: 06/13/2019 01:08 PM     Modules accepted: Orders

## 2019-06-13 NOTE — Telephone Encounter (Signed)
Provider: Sunday Spillers  Last appointment: 04/27/19  Next appointment: 07/26/19   Last prescribed date per EMR: Drug Name test strips Date 05/12/18 x34yr Med last filled: unknown  Med last discussed in clinic note: 04/27/19  Indication: DM  Labs (if required by protocol): n/a   Meets Protocol: Yes: Refill request has been pended to the pharmacist for review per P&T Protocol 06/13/2019

## 2019-06-13 NOTE — Addendum Note (Signed)
Addended by: Letitia Caul on: 06/13/2019 10:34 AM     Modules accepted: Orders

## 2019-06-17 ENCOUNTER — Other Ambulatory Visit: Payer: Self-pay | Admitting: Family Medicine

## 2019-06-17 NOTE — Telephone Encounter (Signed)
Pt last seen by PCP 06/01/19

## 2019-06-21 ENCOUNTER — Encounter: Payer: Self-pay | Admitting: "Endocrinology

## 2019-06-21 ENCOUNTER — Other Ambulatory Visit: Payer: Self-pay | Admitting: "Endocrinology

## 2019-06-21 ENCOUNTER — Telehealth: Payer: Self-pay | Admitting: "Endocrinology

## 2019-06-21 DIAGNOSIS — I10 Essential (primary) hypertension: Secondary | ICD-10-CM

## 2019-06-21 MED ORDER — AMLODIPINE 10 MG TABLET
10.0000 mg | ORAL_TABLET | Freq: Every day | ORAL | 3 refills | Status: DC
Start: 2019-06-21 — End: 2020-07-12

## 2019-06-21 NOTE — Telephone Encounter (Signed)
Hello,   We received [facility] physician order forms for     I faxed paperwork to  [phone], with confirmation fax was received ok, to be placed in doctor's box so it can be filled out with PECOS signature.  Can you assist with this for patient at your convenience?  Thank You,   Roque Lias,  Refill Coordinator   [facility])  For Hospital Refill Service, EMR Error Pool  and IM and Specialty Clinics,  [address], [place], [place] [address]

## 2019-06-21 NOTE — Telephone Encounter (Signed)
Provider: Sunday Spillers [facility])    Last appointment: 04/27/2019  Next appointment: 07/26/2019  Last prescribed date per EMR: Drug Name:Amlodipine 4m Date: 05/12/2018 # 978w/ 3 refills  Med last filled: 08/08/2018  Med last discussed in clinic note: 04/27/2019  Outpatient medications taking list  Indication: HTN  Labs (if required by protocol): n/a     Meets Protocol: Yes: Refill request has been pended to the pharmacist for review per P&T Protocol 06/21/2019

## 2019-06-21 NOTE — Telephone Encounter (Signed)
From: [name]  To: Minna Merritts, MD  Sent: 06/17/2019 7:20 PM PST  Subject: Visit Follow-up Question    Hi, the [place] asked me to contact you and share that they are sending you a renewal request. They pulled from an old order from you. I am out of refills and Dr. Vaughan Browner defers filling any meds since she no longer treats [place]. Thank you for everything you do for [place]. See you next month.     [name]

## 2019-06-21 NOTE — Telephone Encounter (Signed)
Refill authorized per P&T Protocol 06/21/2019

## 2019-06-26 ENCOUNTER — Ambulatory Visit: Payer: Medicare Other | Attending: Family Medicine

## 2019-06-26 DIAGNOSIS — Z23 Encounter for immunization: Secondary | ICD-10-CM

## 2019-06-26 NOTE — Progress Notes (Signed)
Patient WAS wearing a surgical mask  Droplet precautions were followed when caring for the patient.   PPE used by provider during encounter: Surgical mask, Face Shield/Goggles and Gloves    The patient is eligible to receive the COVID-19 Vaccine at this time: Yes    The patient has received a COVID vaccine previously: No      The patient acknowledges receipt of the Fact Sheet for Recipients and Caregivers for the Pfizer-BioNtech vaccine, which includes information about the risks and benefits of the vaccine and available alternatives. The patient was informed the FDA has authorized the emergency use of the COVID-19 Vaccine, which is not an FDA-approved vaccine and that the patient may accept or refuse the vaccine.    Pre-Screening Documentation:      COVID-19 Immunization Allergies  1. Have you ever had a severe allergic reaction to a previous dose of the COVID-19 Vaccine or to any ingredient of the COVID-19 Vaccine? (Review ingredients in the Fact Sheet for Recipients and Caregivers): No  2. Have you ever had an immediate allergic reaction of any severity within 4 hours of receiving a previous dose of the COVID-19 Vaccine or any of its ingredients?: No  3. Have you ever had an immediate allergic reaction of any severity to polysorbate or polyethylene glycol [PEG]?: No  Did the patient answer yes to any of questions 1-3?: No (proceed to question 4)      COVID-19 Immunization Clinical Considerations  4.Are you pregnant or breastfeeding?: No  5.Do you have a weakened immune system caused by something such as HIV infection or cancer, or do you take medications that affect the immune system?: No  6. Do you have a bleeding disorder or are you taking blood thinners? : No  Did the patient answer yes to any of questions 4-6? : No (proceed to question 7)      COVID-19 Immunization Deferral Considerations  7. Have you received any other vaccines within the past 14 days? : No  8. Have you tested positive for COVID-19 in the  last 14 days? : No  9. Have you received a monoclonal antibody or convalescent plasma treatment for COVID-19 in the past 90 days? : No  10. Have you had a confirmed COVID-19 exposure in the past 14 days?: No  11. Are you feeling sick today? : No  Did the patient answer yes to any of questions 7-11?: No (proceed to question 12)      COVID-19 Immunization Dermal Filler Consideration  12. Have you received a dermal filler injection?: No (proceed to question 13)      COVID-19 Immunization Observation Considerations  13. Do you have a history of immediate allergic reaction of any severity to a vaccine?: No  14. Do you have a history of immediate allergic reaction of any severity to an injectable medication?: No  15. Do you have a history of severe allergic reaction due to any cause? : No  Did the patient answer yes to any of questions 13-15? : No  If no, the patient was informed a 15-minute observation period is recommended after the vaccine to watch for a reaction.: Confirmed        All patient questions were answered and the patient agrees to receive the COVID-19 vaccine today.     Vaccine prepared and administered according to the current Emergency Use Authorization and Fact Sheet for Healthcare Providers Administering Vaccine.    Patient given vaccine card and discharge instructions with information about the  Fact Sheet for Recipients and Caregivers and the v-safe program. Patient directed to observation area.     Armida Sans, RN

## 2019-06-28 ENCOUNTER — Encounter: Payer: Self-pay | Admitting: Family Medicine

## 2019-06-28 NOTE — Telephone Encounter (Signed)
Spoke with patient.  Appointment made for 06/29/19.

## 2019-06-28 NOTE — Telephone Encounter (Signed)
From: [name]  To: [name], MD  Sent: 06/26/2019 10:21 AM PST  Subject: Non-urgent Medical Advice Question    Hi Dr., I am writing you at [facility] insistence. On Saturday I was having pressure in my left chest area sort of at the top area of my breast area. It lasted pretty much all day no more what I did or did nothing. I mentioned it to him about 6 pm. He had me take one of his nitro tablets and then about an hour later 2 low dose aspirin. At that point I could no longer stay awake so I laid down on the sofa next to him and slept for a couple of hours. When I woke up the pressure seem to be much less and eventually gone. There was some discomfort going around to my left shoulder blade area as well. I also have had a couple episodes of palpitations. Nothing to bad but clearly aware of it happening.   I am sitting in the post vaccine injection area right now (just got my second one). I do feel a little bit of the left side pressure coming back but not as intense as Saturday.   Just wanted you to know in case here is anything you want me to do. It has been awhile since I was last seen in cardiology. The Dr. did test but didn't find anything at that time. I have a strong family history of heart disease on both sides but have been lucky so far.   If you think it's ok and I don't need to see you right now I will see you then of April. Thanks

## 2019-06-28 NOTE — Telephone Assessment (Signed)
Paperwork from [facility] for diabetic detailed written order (glucose test strips) was completed by MD and  Faxed to [phone] on 06-28-19.    Thank You     Tenna Delaine MA

## 2019-06-29 ENCOUNTER — Encounter: Payer: Self-pay | Admitting: Family Medicine

## 2019-06-29 ENCOUNTER — Ambulatory Visit (INDEPENDENT_AMBULATORY_CARE_PROVIDER_SITE_OTHER): Payer: Medicare Other | Admitting: Family Medicine

## 2019-06-29 ENCOUNTER — Ambulatory Visit
Admission: RE | Admit: 2019-06-29 | Discharge: 2019-06-29 | Disposition: A | Discharge status: Home / Self Care | Payer: Medicare Other | Source: Ambulatory Visit | Attending: Family Medicine | Admitting: Family Medicine

## 2019-06-29 VITALS — BP 136/83 | HR 65 | Temp 97.9°F | Resp 20 | Wt 176.6 lb

## 2019-06-29 DIAGNOSIS — J45909 Unspecified asthma, uncomplicated: Secondary | ICD-10-CM

## 2019-06-29 DIAGNOSIS — R0789 Other chest pain: Secondary | ICD-10-CM

## 2019-06-29 DIAGNOSIS — N281 Cyst of kidney, acquired: Secondary | ICD-10-CM

## 2019-06-29 DIAGNOSIS — Z87891 Personal history of nicotine dependence: Secondary | ICD-10-CM

## 2019-06-29 DIAGNOSIS — K7581 Nonalcoholic steatohepatitis (NASH): Secondary | ICD-10-CM

## 2019-06-29 DIAGNOSIS — I10 Essential (primary) hypertension: Secondary | ICD-10-CM

## 2019-06-29 DIAGNOSIS — Z8249 Family history of ischemic heart disease and other diseases of the circulatory system: Secondary | ICD-10-CM

## 2019-06-29 DIAGNOSIS — Z833 Family history of diabetes mellitus: Secondary | ICD-10-CM

## 2019-06-29 DIAGNOSIS — K7460 Unspecified cirrhosis of liver: Secondary | ICD-10-CM

## 2019-06-29 NOTE — Patient Instructions (Signed)
You have been referred to a specialist. We are pleased to offer the services of [facility] specialists located at the [facility] or [place] sites. Please allow 7 business days (3 if urgent) for the referral to be processed. After that time you may call: Cardiology:  [facility] #[id number], [phone]       Please seek immediate attention at the ER for any further chest pain, worsening or concerns.     Call or return to clinic if symptoms worsen, fail to improve  or other symptoms develop.

## 2019-06-29 NOTE — Progress Notes (Signed)
Avary Eichenberger is a 22yrfemale who presents with a chief complaint of "I think it is nothing but he (pointing at DJ--spouse) made me come in".    Chief Complaint   Patient presents with    Chest Pain       NJazzman Loughmilleris a 89yrld female with history of DM2 and CAD in the family reports mild, generalized and intermittent left sided chest pain as pressure  for 5 days; initial symptoms on Friday during the day lasting hrs; last episode was last night lasting minutes; nothing makes her symptoms better--tried taking ASA and nitroglycerin on Friday; nothing makes her symptoms worse but lost mother recently and daughter won't speak to her.     Review of Systems -   no fever or chills  CV: no current chest pain, palpitations, shortness of breath or edema; reports some palpitations on Friday   Ultrasound of pelvic and abd reviewed with patient   Declines counseling or AMb care referral suggesting she does not want to continue to talk about her stressors with anyone      Past Medical History:   Diagnosis Date    Angina pectoris ([facility]/22/2019    Anxiety     Asthma     Cirrhosis ([facility]    Constipation, chronic     Depression     Diabetes mellitus ([facility]    Fatty liver     Hypertension     Kidney disease 2015    cyst left kidney per pt    Liver fibrosis     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/20/2016    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    Psychiatric illness      Social History     Socioeconomic History    Marital status: MARRIED     Spouse name: Not on file    Number of children: 1    Years of education: Not on file    Highest education level: Not on file   Occupational History    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 [employer]  Tobacco Use    Smoking status: Former Smoker     Packs/day: 0.10     Years: 20.00     Pack years: 2.00    Smokeless tobacco: Never Used    Tobacco comment: quit 30 years ago   Substance and Sexual Activity    Alcohol use: Yes      Alcohol/week: 0.0 standard drinks     Comment: rare    Drug use: No    Sexual activity: Yes     Partners: Male   Other Topics Concern    Not on file   Social History Narrative    Not on file     Social Determinants of Health     Financial Resource Strain:     Difficulty of Paying Living Expenses: Not on file   Food Insecurity:     Worried About [place] the Last Year: Not on file    Ran Out of Food in the Last Year: Not on file   Transportation Needs:     Lack of Transportation (Medical): Not on file    Lack of Transportation (Non-Medical): Not on file   Physical Activity:     Days of Exercise per Week: Not on file    Minutes of Exercise per Session: Not on file   Stress:     Feeling of Stress :  Not on file   Social Connections:     Frequency of Communication with Friends and Family: Not on file    Frequency of Social Gatherings with Friends and Family: Not on file    Attends Religious Services: Not on file    Active Member of Clubs or Organizations: Not on file    Attends Archivist Meetings: Not on file    Marital Status: Not on file   Intimate Partner Violence:     Fear of Current or Ex-Partner: Not on file    Emotionally Abused: Not on file    Physically Abused: Not on file    Sexually Abused: Not on file     Family History   Problem Relation Name Age of Onset    Diabetes Father      Heart Mother          MI at 48s     Non-contributory Brother      Non-contributory Brother         OBJECTIVE:  BP 136/83 (SITE: right arm, Orthostatic Position: sitting, Cuff Size: regular)   Pulse 65   Temp 36.6 C (97.9 F) (Temporal)   Resp 20   Wt 80.1 kg (176 lb 9.4 oz)   LMP 05/06/1983   BMI 31.56 kg/m  96% on Rm air for O2sat  .Marland KitchenGeneral Appearance: healthy, alert, no distress, pleasant affect, cooperative.  Heart:  normal rate and regular rhythm, no murmurs, clicks, or gallops.  Lungs: clear to auscultation.  Extremities:  no cyanosis, clubbing, or edema.  Mental  Status: appropriate affect, behavior and mentation     ASSESSMENT:  (R07.89) Atypical chest pain  (primary encounter diagnosis)  Comment: intermittent symptoms x 5 days; no current symptoms; EKG reviewed with patient and spouse DJ seems to correlate with previous; unclear diagnosis or etiology or prognosis and ER precautions given as we start workup   Plan: POC Electrocardiogram, Transmitted for         Interpretation, EXERCISE STRESS ECHOCARDIOGRAM,        STRESS EKG MONITOR, ECHOCARDIOGRAM COMPLETE, DX        CHEST 2 VIEWS, Cardiology Referral          (N28.1) Cyst of left kidney  Comment: more than 1; unlikely of acute concern   Plan: we discussed we can recheck another ultrasound in 6-12 mos to recheck     (K75.81) NASH (nonalcoholic steatohepatitis)  Comment: recent abd ultrasound consistent with this   Plan: weight loss encouraged     Number and Complexity of Problems Addressed  2 or more stable chronic illnesses  1 acute, uncomplicated illness or injury    Amount and/or Complexity of Data to be Reviewed and Analyzed  3+ unique tests ordered    Time  I spent a total of 30 minutes on the day of the visit.      Total encounter time including history, physical examination, and coordination of care was approximately 30 minutes of which more than 50% was spent counseling regarding assessment/diagnosis and treatment plan. No guarantees were made regarding her medical care or treatment outcome. Barriers to Learning: none.  Patient verbalizes understanding of teaching and instructions.    Electronically signed by:    [name], MD  [place], [place] Board of Family Medicine  Associate physician [facility], [place]   [phone]

## 2019-06-29 NOTE — Nursing Note (Signed)
Patient roomed, chief complaint noted, allergies verified, blood pressure, pulse, respiration, temperature, and weight obtained, screened for pain, and pharmacy verified.    2 Phillis Haggis, M.A.    PPE Used During the Visit:    Eye and Face Protection:          Body Protection:   [] Glasses/Goggles    [] Gown   [x] Face Shield   [x] Surgical Mask   Hand Protection:   [] N-95 Mask     [] Gloves   [] Particulate Respirator   [] Half/Full Face Elastomeric Respirator   [] PAPR    Pat ient:    [x] Surgical Mask      EKG done on patient per pt's symptoms.  Phillis Haggis, MA

## 2019-07-01 ENCOUNTER — Ambulatory Visit: Payer: Medicare Other | Attending: Family Medicine

## 2019-07-01 DIAGNOSIS — I517 Cardiomegaly: Secondary | ICD-10-CM

## 2019-07-01 DIAGNOSIS — R0789 Other chest pain: Secondary | ICD-10-CM

## 2019-07-01 DIAGNOSIS — I361 Nonrheumatic tricuspid (valve) insufficiency: Secondary | ICD-10-CM

## 2019-07-01 DIAGNOSIS — I340 Nonrheumatic mitral (valve) insufficiency: Secondary | ICD-10-CM

## 2019-07-01 LAB — ECHOCARDIOGRAM COMPLETE
AVA (PEAK VEL): 1.97 cmÂ²
IVSD 2D: 1.34 cm (ref 0.7–1.1)
LEFT INTERNAL DIMENSION IN SYSTOLE: 2.28 cm
LEFT VENTRICULAR INTERNAL DIMENSION IN DIASTOLE: 3.86 cm (ref 3.4–5.7)
LVEF (EST): 65 %
MV E-WAVE VEL/E'TISSUE VEL LAT: 12.3 E/A
POSTERIOR WALL: 1.03 cm (ref 0.7–1.1)
TAPSE: 1.97 cm
TV PEAK SYSTOLIC PULMONARY ARTERY PRESSURE: 26 mmHg

## 2019-07-04 LAB — ELECTROCARDIOGRAM WITH RHYTHM STRIP: QTC: 472

## 2019-07-11 ENCOUNTER — Encounter: Payer: Medicare Other | Admitting: Family

## 2019-07-11 ENCOUNTER — Ambulatory Visit: Payer: Medicare Other | Attending: Family Medicine

## 2019-07-11 DIAGNOSIS — R0789 Other chest pain: Secondary | ICD-10-CM

## 2019-07-11 DIAGNOSIS — R079 Chest pain, unspecified: Secondary | ICD-10-CM

## 2019-07-11 NOTE — Progress Notes (Signed)
Dear [name], [name], MD,      Paula Daugherty was seen today for an exercise stress echocardiogram to evaluate atypical chest pain, abnormal EKG .      Impression:  The stress echocardiogram was negative  for ischemia.      Exercise duration: 06:27  % maximal predicted heart rate: 96%  METS: 7.6  Symptoms: none  Arrhythmias: rare occasional PVCs  BP response: normal     Limitations of stress testing reviewed with patient including recommendation to seek medical attention for any worsening symptoms especially with exertion.  Risks of false negative study discussed.    Plan: followup as needed; results of patient's prior "abnormal EKG" reviewed and discussed with patient  .    Findings/results were discussed with the patient in detail at the conclusion of the examination.  Resting echo interpretation pending.  Full stress echocardiogram report will follow in EMR.      Please contact me with any questions or concerns.    Gaye Pollack, MD  Cardiology, [place] [facility]

## 2019-07-12 LAB — ELECTROCARDIOGRAM WITH RHYTHM STRIP: QTC: 486

## 2019-07-14 ENCOUNTER — Ambulatory Visit: Payer: Medicare Other | Admitting: Family

## 2019-07-22 ENCOUNTER — Ambulatory Visit: Payer: Medicare Other | Attending: "Endocrinology

## 2019-07-22 DIAGNOSIS — Z794 Long term (current) use of insulin: Secondary | ICD-10-CM | POA: Insufficient documentation

## 2019-07-22 DIAGNOSIS — E1122 Type 2 diabetes mellitus with diabetic chronic kidney disease: Secondary | ICD-10-CM

## 2019-07-22 DIAGNOSIS — N183 Chronic kidney disease, stage 3 unspecified: Secondary | ICD-10-CM

## 2019-07-23 LAB — HEMOGLOBIN A1C
Hgb A1C,Glucose Est Avg: 146 mg/dL
Hgb A1C: 6.7 % — ABNORMAL HIGH (ref 3.9–5.6)

## 2019-07-24 ENCOUNTER — Encounter: Payer: Self-pay | Admitting: "Endocrinology

## 2019-07-25 ENCOUNTER — Other Ambulatory Visit: Payer: Self-pay | Admitting: GASTROENTEROLOGY

## 2019-07-25 MED ORDER — ONDANSETRON 4 MG DISINTEGRATING TABLET: 8.0000 mg | DISINTEGRATING_TABLET | Freq: Three times a day (TID) | ORAL | 2 refills | Status: DC | PRN

## 2019-07-26 ENCOUNTER — Encounter: Payer: Self-pay | Admitting: "Endocrinology

## 2019-07-26 ENCOUNTER — Ambulatory Visit: Payer: Medicare Other | Attending: "Endocrinology | Admitting: "Endocrinology

## 2019-07-26 DIAGNOSIS — Z794 Long term (current) use of insulin: Secondary | ICD-10-CM | POA: Insufficient documentation

## 2019-07-26 DIAGNOSIS — Z7984 Long term (current) use of oral hypoglycemic drugs: Secondary | ICD-10-CM | POA: Insufficient documentation

## 2019-07-26 DIAGNOSIS — Z79899 Other long term (current) drug therapy: Secondary | ICD-10-CM | POA: Insufficient documentation

## 2019-07-26 DIAGNOSIS — E1122 Type 2 diabetes mellitus with diabetic chronic kidney disease: Secondary | ICD-10-CM | POA: Insufficient documentation

## 2019-07-26 DIAGNOSIS — E119 Type 2 diabetes mellitus without complications: Secondary | ICD-10-CM | POA: Insufficient documentation

## 2019-07-26 DIAGNOSIS — E278 Other specified disorders of adrenal gland: Secondary | ICD-10-CM | POA: Insufficient documentation

## 2019-07-26 DIAGNOSIS — N183 Chronic kidney disease, stage 3 unspecified: Secondary | ICD-10-CM | POA: Insufficient documentation

## 2019-07-26 DIAGNOSIS — E1165 Type 2 diabetes mellitus with hyperglycemia: Secondary | ICD-10-CM | POA: Insufficient documentation

## 2019-07-26 DIAGNOSIS — E1129 Type 2 diabetes mellitus with other diabetic kidney complication: Secondary | ICD-10-CM | POA: Insufficient documentation

## 2019-07-26 MED ORDER — METFORMIN 1,000 MG TABLET: 1000.0000 mg | ORAL_TABLET | Freq: Two times a day (BID) | ORAL | 3 refills | Status: DC

## 2019-07-26 MED ORDER — ATORVASTATIN 10 MG TABLET: 10.0000 mg | ORAL_TABLET | Freq: Every day | ORAL | 3 refills | Status: DC

## 2019-07-26 NOTE — Progress Notes (Signed)
Endocrinology Follow up clinic note:    Chief Complaint:  "I'm here to follow up on my diabetes."    HPI: Paula Daugherty is a 74yrold female who has a diagnosis of type 2 diabetes mellitus who presents to Endocrinology for follow up. Her history is as follows:  She was initially diagnosed with diabetes around 2000 on routine labs. She has a strong family history of diabetes so she wasn't surprised at this. She was started on Metformin around 2005 and then she was started on insulin in 2014. She was up to 64 units of Lantus at night. However, after making changes to her diet and lifestyle, she was taken off insulin in 2015. Basal insulin was added back to her regimen in 2018.      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    Interim History: She returns today for follow up. Her last visit with Endocrinology was on 04/27/2019. Since this visit, she states that she continues to deal with GI upset, and digestion issues. She had started CBD oil to see if this would help with her symptoms but did not have any relief. She did notice that when taking the CBD oil, her blood sugar was slightly higher and she increased her Lantus dose slightly for this. She denies any recent hypoglycemia or hypoglycemia symptoms.     Current Regimen:              Lantus 33 units QHS              Metformin 1000 mg BID              Jardiance 25 mg daily  Insulin injection technique: Holds the needle in for a few seconds with each injection  Injection sites: Abdomen  Rotates sites: Yes  Discards insulin 30 days after opening: Yes  Hypoglycemia: Has been significantly less lately              Awareness: Yes, when blood glucose is in the 80s  Hyperglycemia: Almost never sees numbers >180     Meter brought today: Yes  Home blood glucose log brought to clinic today: No  Checks blood glucose: 2x/day  Home blood glucose numbers per pt's log:   Blood Glucose:                                     Pre-breakfast: 119 to  172               Pre-lunch: 105 to 168  Last eye exam: 11/09/2018 no diabetic retinopathy, f/u 1 year  Foot exam: Will do today             ROS:  All other systems were reviewed and are negatative except for pertinent positive and negative responses as documented in HPI.     Medications:  Medication reconciliation was performed today.   Outpatient Medications Marked as Taking for the 07/26/19 encounter (Office Visit) with SCorky Mull MD   Medication Sig Dispense Refill    Amlodipine (NORVASC) 10 mg Tablet Take 1 tablet by mouth every day. 90 tablet 3    Atorvastatin (LIPITOR) 10 mg Tablet Take 1 tablet by mouth every day at bedtime. 90 tablet 3    Empagliflozin (JARDIANCE) 25 mg Tablet Take 1 tablet by mouth every day. 90 tablet 3    Insulin Glargine (  LANTUS SOLOSTAR U-100 INSULIN) 100 unit/mL (3 mL) Pen Inject 40 units subcutaneously every night at bedtime. Please provide 3 month supply. 45 mL 3    Insulin Pen Needles, Disposable, 31 gauge x 5/16" For insulin administration once a day 100 each 3    Losartan (COZAAR) 25 mg Tablet Take 1 tablet by mouth every day. 90 tablet 1    Metformin (GLUCOPHAGE) 1,000 mg tablet Take 1 tablet by mouth 2 times daily with meals. (diabetes) 180 tablet 3    Omeprazole (PRILOSEC) 20 mg Delayed Release Capsule TAKE 1 CAPSULE BY MOUTH TWICE DAILY BEFORE MEALS 180 capsule 1    Ondansetron (ZOFRAN) 8 mg Tablet Take 1 tablet by mouth every 8 hours if needed. 270 tablet 3    Ondansetron (ZOFRAN-ODT) 4 mg disintegrating tablet Take 2 tablets by mouth every 8 hours if needed. 50 tablet 2    ONETOUCH ULTRA BLUE TEST STRIP Strips Use to test blood glucose twice daily. (E11.9) 200 strip 3     I did review patient's past medical and family/social history, no changes noted.   PMH:  Past medical history was reviewed from problem list.   Patient Active Problem List   Diagnosis    DM2 (diabetes mellitus, type 2) ([place])    Depression with anxiety    Stress at home    Leg  cramps-right calf    Right ear pain    Fibromyalgia    HTN (hypertension)    GERD (gastroesophageal reflux disease)    Hyperlipidemia with target LDL less than 70    Vitamin D insufficiency    Abnormal LFTs    Renal cyst ( 6.4 cm cyst left kidney)    Cough    Fatty liver    Macroalbuminuric diabetic nephropathy (HCC)    History of actinic keratoses    Cataract    Ptosis of eyelid    Liver fibrosis    Adrenal nodule (HCC)    Medication Therapy Auth    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HCC)    Mixed conductive and sensorineural hearing loss of both ears    Neoplasm of uncertain behavior of skin    Abdominal pain, generalized    Nausea without vomiting    Asymptomatic microscopic hematuria    Subcutaneous nodule    Angina pectoris (HCC)    Mild episode of recurrent major depressive disorder (HCC)    NASH (nonalcoholic steatohepatitis)     VITAL SIGNS:  BP 133/79 (SITE: left arm, Orthostatic Position: sitting, Cuff Size: regular)   Pulse 64   Temp (!) 35.8 C (96.5 F) (Temporal)   Ht 1.575 m (5' 2" )   Wt 78.9 kg (173 lb 15.1 oz)   LMP 05/06/1983   SpO2 99%   BMI 31.81 kg/m   Body mass index is 31.81 kg/m.    PHYSICAL EXAM:  General Appearance: healthy, alert, no distress, pleasant affect, cooperative.  Eyes:  conjunctivae and corneas clear. EOM's intact. sclerae normal.  Neck:  Neck supple. No adenopathy, thyroid symmetric, normal size.  Heart:  normal rate and regular rhythm, no murmurs, clicks, or gallops.  Lungs: clear to auscultation.  Abdomen: BS normal.  Abdomen soft, non-tender.  No masses or organomegaly.  Extremities:  no cyanosis, clubbing, or edema.  Skin:  Skin color, texture, turgor normal. No rashes or lesions.  Neuro: Gait normal. No tremor appreciated. Sensation and strength grossly normal.  Examination of the feet reveals normal DP and PT pulses, no trophic changes or ulcerative lesions,  normal sensory exam and normal monofilament exam.  Mental Status:  Appearance/Cooperation: in no apparent distress and well developed and well nourished  Eye Contact: normal  Speech: normal volume, rate, and pitch    LAB TESTS/STUDIES:   I personally reviewed the following laboratory and imaging studies.     04/20/2019 10:29 06/01/2019 11:36 07/22/2019 09:48   SODIUM 142 142    POTASSIUM 4.1 4.3    CHLORIDE 105 104    CARBON DIOXIDE TOTAL 25 25    UREA NITROGEN, BLOOD (BUN) 15 15    CREATININE BLOOD 0.96 1.02    GLUCOSE 113 (H) 121 (H)    CALCIUM 9.5 9.6    PROTEIN 8.1 8.1    ALBUMIN 3.9 3.9    ALKALINE PHOSPHATASE (ALP) 112 100    ASPARTATE TRANSAMINASE (AST) 60 (H) 70 (H)    BILIRUBIN TOTAL 0.8 1.1    ALANINE TRANSFERASE (ALT) 43 51    E-GFR, African American (Female) 67 64    E-GFR, Non-African American (Female) 60 55    CHOLESTEROL 126     TRIGLYCERIDE 124     LDL CHOLESTEROL CALCULATION 46     HDL CHOLESTEROL 55     NON-HDL CHOLESTEROL 71     TOTAL CHOLESTEROL:HDL RATIO 2.3     LIPASE  37    THYROID STIMULATING HORMONE 0.92     THYROXINE, FREE (FREE T4) 0.86     HGB A1C 6.3 (H)  6.7 (H)   HGB A1C,GLUCOSE EST AVG 134  146      06/01/2019 11:36   White Blood Cell Count 7.8   Hemoglobin 12.2   Hematocrit 37.4   MCV 77.2 (L)   RDW 17.2 (H)   Platelet Count 184      04/20/2019 11:02   CREATININE SPOT URINE 134.48   MICROALBUMIN URINE 58.0   MICROALBUMIN/CREATININE RATIO 431 (H)     01/27/2019:   Ref Range & Units    TOTAL VOLUME mL 1220    TIME OF COLLECTION hr 24    CORTISOL,UR FREE PER VOLUME ug/L 7.70    CORTISOL,UR FREE PER 24HR <=45.0 ug/d 9.4    CORTISOL,UR FREE RATIO TO CRT ug/g CRT 9.39       Ref Range & Units    TOTAL VOLUME mL 1220    TIME OF COLLECTION hr 24    METANEPHRINE,UR-PER VOLUME ug/L 96    METANEPHRINE,UR-PER 24HR 36 - 229 ug/d 117    METANEPHRINE,UR-RATIO TO CRT 0 - 300 ug/g CRT 113    NORMETANEPHRINE,UR-PER VOLUME ug/L 383    NORMETANEPHRINE,UR-PER 24HR 95 - 650 ug/d 467    NORMETANEPHRIN,UR-RATIO TO CRT 0 - 400 ug/g CRT 451High     METANEPHRINES INTERPRETATION   See Note      CT Abdomen and Pelvis (04/08/2017):  FINDINGS:  URINARY COLLECTING SYSTEM:  Renal stones: None.  Ureteral stones: None.  Bladder stones: None.  Solid / enhancing renal mass: None. Left interpolar cyst with thin  peripheral calcification. Additional subcentimeter hypodensities, too small  to characterize but statistically likely benign.  Renal enhancement: Normal symmetric enhancement of the kidneys.  There is segmental nonopacification of the bilateral distal ureters.  Hydronephrosis: None.  Hydroureter: None.  Renal collecting system: No intraluminal filling defects.  Ureters: No intraluminal filling defects in the opacified segments of the  ureters. Segments of the ureter that are not opacified do not show any wall  thickening, suspicion of an enhancing mass, or focal or segmental  dilatation.  Urinary bladder: No focal or asymmetric bladder wall thickening, focal  filling defects, or enhancing polypoid lesions.  REMAINING ABDOMEN AND PELVIS:  Lower Chest: Heavy coronary artery calcification. Bibasilar atelectasis.  Liver: Unchanged hepatic steatosis and hepatomegaly.  Bile Ducts: Unremarkable.  Gallbladder: Unremarkable.  Pancreas: Unremarkable.  Spleen: Unremarkable.  Adrenal Glands: Unchanged 1.8 cm left adenoma.  GI Tract: Diverticulosis without evidence of acute diverticulitis.  Peritoneal Cavity: No free fluid or free air.  Uterus and Ovaries: Status post hysterectomy. No adnexal masses.  Lymph Nodes: Unchanged 1.3 cm porta hepatic lymph node. Unchanged  additional smaller upper abdominal lymph nodes.  Major Vascular Structures: Moderate aortoiliac artery calcification.  Soft Tissues: Unchanged partially calcified left gluteal soft tissue  densities, likely injection granulomas.  Musculoskeletal: No suspicious bony lesions or acute fractures. Mild  multilevel degenerative changes of the visualized spine.    IMPRESSION:  1. No cause for hematuria. No urolithiasis, renal mass, or  uroepithelial  mass in the kidneys, ureters, or urinary bladder.  2. No hydronephrosis or hydroureter.  3. Unchanged 1.8 cm left adrenal adenoma.    Impression: This is a 74yrold female with Diabetes Mellitus Type II who presents to Endocrinology for follow up. Overall, her glycemic control is at goal as evidenced by her most recent A1c of 6.7%.  Review of her blood glucose meter download today demonstrates overall well controlled blood glucose with minimal hyperglycemia and no hypoglycemia. Because of this, I have not recommended any changes to her current medication doses.    She was encouraged to report any hypoglycemia or persistent hyperglycemia prior to follow up.      She also has a history of an adrenal nodule. Hormonal evaluation was normal in 01/2019 and she will be due for repeat hormonal evaluation now.     DIABETES HISTORY  (-) h/o retinopathy.      (-) h/o microalbuminuria/nephropathy.  (-) h/o neuropathy.  (-) h/o Autonomic dysfunction  (-) h/o Gastroparesis  (-) h/o CAD  (-) h/o PVD     Last eye exam: 11/2018, f/u 1year  Last urine microalbumin: 04/2019, 431  Pneumovax: 12/2014, Prevnar 2018  Influenza vaccine: 12/2018  (-) ASA  (+) Statin  (+) ACE/ARB          Recommendations:  (E11.29) Type 2 diabetes mellitus with other diabetic kidney complication  (primary encounter diagnosis)  - Continue current dose of metformin, Jardiance, and Lantus  - Encouraged dietary changes and increased exercise  - Encouraged patient to continue blood glucose monitoring to at least 1x/day, goal to check 2x/day  - Last LDL at goal, continue statin, repeat due 04/2020  - Last microalbumin:creatinine ratio elevated, continue cozaar, repeat due 04/2020  - Up to date on screening eye exam, next due 11/2019  - Repeat A1c prior to follow up appointment     2. Adrenal nodule  - Biochemical evaluation normal in 01/2019, stability by imaging in 04/2017  - Repeat hormonal evaluation due in the fall (~1 year from previous) for 5 years total (2022)      Approximately 30 minutes were spent with patient, greater than 50% of which was spent counseling the patient on diabetes management and on coordination of care.    Follow up in 3 months    If you have any questions, please do not hesitate to contact me at [phone]  Thank you for allowing me to participate in the care of this patient.    EDUCATION:  I educated/instructed the patient or caregiver regarding  all aspects of the above stated plan of care.  The patient or caregiver indicated understanding.      [place] interpreter was not used.    Report electronically signed by:  Sunday Spillers, M.D.  Clinical Associate Professor  Department of Endocrinology    Patient WAS wearing a surgical mask  Droplet precautions were followed when caring for the patient.   PPE used by provider during encounter: Surgical mask and Face Shield/Goggles

## 2019-07-26 NOTE — Nursing Note (Signed)
Patient WAS wearing a surgical mask  Droplet precautions were followed when caring for the patient.   PPE used by provider during encounter: Surgical mask and Face Shield/Goggles  Vital signs taken, allergies verified, screened for pain.    Izaiha Lo MA

## 2019-08-10 ENCOUNTER — Ambulatory Visit: Payer: Medicare Other | Attending: Hand Surgery | Admitting: Hand Surgery

## 2019-08-10 ENCOUNTER — Encounter: Payer: Self-pay | Admitting: Hand Surgery

## 2019-08-10 VITALS — BP 148/81 | HR 71 | Temp 97.9°F | Resp 18 | Ht 62.0 in | Wt 176.1 lb

## 2019-08-10 DIAGNOSIS — M65332 Trigger finger, left middle finger: Secondary | ICD-10-CM | POA: Insufficient documentation

## 2019-08-10 NOTE — Progress Notes (Signed)
Orthopaedic Surgery Hand and Upper Extremity Clinic H&P NOTE:  08/10/2019     Patient Name: Paula Daugherty  MRN: [id number]    CHIEF COMPLAINT:  Right middle finger trigger finger    HPI:  [name] is a 74yrold female history of diabetes right hand dominant who presents with right middle finger trigger finger. Began 4-5 mo ago.    Noticed it catching at times. Some discomfort. Has to straighten finger. Worse with cold temperatures.    The patient denies pain elsewhere in the symptomatic upper extremity.    Retired. 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 Den[place] smoking.       PAST MEDICAL HISTORY:  Past Medical History:   Diagnosis Date    [place] ([facility] [place] 09/23/2017    Anxiety     Asthma     Cirrhosis ([place]     Constipation, chronic     Depression     Diabetes mellitus ([place]     Fatty liver     Hypertension     Kidney disease 2015    cyst left kidney per pt    Liver fibrosis     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/20/2016    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 04/17/2016    Psychiatric illness        PAST SURGICAL HISTORY:  Past Surgical History:   Procedure Laterality Date    BIOFEEDBACK PERI/URO/RECTAL  10/21/2017    session #1: N Score = 5.4    BIOFEEDBACK PERI/URO/RECTAL  11/04/2017    session #2    BIOFEEDBACK PERI/URO/RECTAL  11/18/2017    Session #3    BIOFEEDBACK PERI/URO/RECTAL  12/02/2017    Session #4    BIOFEEDBACK PERI/URO/RECTAL  12/30/2017    Session #5    BIOFEEDBACK PERI/URO/RECTAL  03/23/2018    Session #6    CAPSULE ENDOSCOPY  01/27/2018    CAPSULE ENDOSCOPY  01/29/2018         COLONOSCOPY  01/04/14    polyp--TA and erosion; hemorrhoids; tics; repeat x 3 yrs    COLONOSCOPY  12/16/2017    TA; repeat in 3 yrs    EGD      EGD  04/08/2019    biopsy path--pending    HC BIOFEEDBACK PERI/URO/RECTAL  05/02/2018         HC COLONOSCOPY,REMV LESN,SNARE  12/16/2017    MAMMOPLASTY, REDUCTION  1985     MANOMETRY  09/02/2017    work on constipation; kegels and biofeedback; proceed w/colonoscopy     PHACOEMULSIFICATION, CATARACT Right 02/12/2015    with IOL    PR ESOPHAGOGASTRODUODENOSCOPY TRANSORAL DIAGNOSTIC  02/12/2016    EGD--polyp--path pending; antral gastritis; no varices     PR ESOPHAGOGASTRODUODENOSCOPY TRANSORAL DIAGNOSTIC  06/11/2016    EGD--biopsy at site of carcinoid tumor path pending     PR ESOPHAGOGASTRODUODENOSCOPY TRANSORAL DIAGNOSTIC      EGD--folds in duodenum--path pending     PR ESOPHAGOGASTRODUODENOSCOPY TRANSORAL DIAGNOSTIC  04/2017    EGD; repeat EGD in 1 year     PR ESOPHAGOGASTRODUODENOSCOPY TRANSORAL DIAGNOSTIC  04/20/2018    gastritis; nodule--path pending    PR KNEE SCOPE,DIAGNOSTIC  1980s    Knee arthroscopy    PR LIGATE FALLOPIAN TUBE      Tubal ligation    PR REPAIR TYMPANIC MEMBRANE      Tympanoplasty    PR TOTAL ABDOM HYSTERECTOMY  1980s    partial; no BSO  REPAIR, BLEPHAROPTOSIS Bilateral 08/06/2015    Blepharoplasty bilateral upper lids       MEDICATIONS:  Current Outpatient Medications on File Prior to Visit   Medication Sig Dispense Refill    Amlodipine (NORVASC) 10 mg Tablet Take 1 tablet by mouth every day. 90 tablet 3    Atorvastatin (LIPITOR) 10 mg Tablet Take 1 tablet by mouth every day at bedtime. 90 tablet 3    Empagliflozin (JARDIANCE) 25 mg Tablet Take 1 tablet by mouth every day. 90 tablet 3    Insulin Glargine (LANTUS SOLOSTAR U-100 INSULIN) 100 unit/mL (3 mL) Pen Inject 40 units subcutaneously every night at bedtime. Please provide 3 month supply. 45 mL 3    Insulin Pen Needles, Disposable, 31 gauge x 5/16" For insulin administration once a day 100 each 3    Losartan (COZAAR) 25 mg Tablet Take 1 tablet by mouth every day. 90 tablet 1    Metformin (GLUCOPHAGE) 1,000 mg tablet Take 1 tablet by mouth 2 times daily with meals. (diabetes) 180 tablet 3    Omeprazole (PRILOSEC) 20 mg Delayed Release Capsule TAKE 1 CAPSULE BY MOUTH TWICE DAILY BEFORE  MEALS 180 capsule 1    Ondansetron (ZOFRAN-ODT) 4 mg disintegrating tablet Take 2 tablets by mouth every 8 hours if needed. 50 tablet 2    ONETOUCH ULTRA BLUE TEST STRIP Strips Use to test blood glucose twice daily. (E11.9) 200 strip 3     No current facility-administered medications on file prior to visit.       ALLERGIES:    Contrast Dye [Radiopaque Agent]    Hives  Hydrochlorothiazide    Other-Reaction in Comments    Comment:Patient reports  Januvia [Sitagliptin]    Other-Reaction in Comments    Comment:Patient reports  Lyrica [Pregabalin]    Other-Reaction in Comments    Comment:Patient reports  Omnipaque [Iohexol]    Other-Reaction in Comments    Comment:Patient reports  Prozac [Fluoxetine Hcl]    Other-Reaction in Comments    Comment:Patient reports  Sulfa (Sulfonamide Antibiotics)    Rash  Topiramate    Other-Reaction in Comments    Comment:[place]    FAMILY HISTORY:  No pertinent family history    SOCIAL HISTORY:  Social History     Occupational History    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    REVIEW OF SYMPTOMS:      General: Negative   Eyes: Negative   Ear, Nose and Throat: Negative   Respiratory: Negative   Cardiovascular: Negative   Gastrointestinal: Negative   Genito-urinary: Negative   Musculoskeletal: Negative  Neurological: Negative   Psychological: Negative  HEME: Negative   ENDO: Negative   SKIN: Negative    VITALS:  Vitals:    08/10/19 0851 08/10/19 0856   BP: 146/79 148/81   SITE: right arm right arm  Orthostatic Position: sitting sitting   Cuff  Size: regular regular   Pulse: 70 71   Resp: 18    Temp: 36.6 C (97.9 F)    TempSrc: Skin    Weight: 79.9 kg (176 lb 2.4 oz)    Height: 1.575 m (5' 2" )        EXAM:  General: NAD, A&Ox3  HEENT: NC/AT  CV: RRR by peripheral pulse  Pulmonary: Non-labored breathing on RA  RUE:  Skin intact, no deformity  Mild tenderness A1 pulley right middle finger  Palpable catching  Intact FDP/FDS/EDC  SILT m/r/u  WWP CR< 2s    LABS:    No results found for this basename: HCT:3 in the last 72 hours     No results found for this basename: AMYL,WBC in the last 24 hours    PT/INR (24 hours):   No results found for this basename: PT:*,PTT:*,INR:* in the last 24 hours   BMP (24 hours):   No results found for this basename: NA:*,K:*,CL:*,CO2:*,CR:*,GFRAA:*,GFRNAA:*,BUN:*,GLU:*,CA:* in the last 24 hours    No results found for this basename: TP:*,ALB:*,TBIL:*,ALP:*,AST:*,ALT:*,BILID:* in the last 24 hours    No results found for this basename: CAIONWB:*,CARBOXHGBART:* in the last 24 hours     IMAGING:    XRs from 06/01/19 reviewed without any significant bony abnormality. Mild OA    I have personally reviewed the above images and labs.         IMPRESSION AND RECOMMENDATIONS:  [name] is a 13yryear old female right hand dominant with right middle finger trigger finger.      We discussed the patient's diagnosis and treatment options in detail today. This included a description of the associated pathology and non-operative/operative treatment options with the use of illustrations and diagrams.    Discussed injections vs. Surgery for trigger finger.    Under sterile conditions, Dr. SDaine Florasinjected 1 cc of 1% lidocaine and 1 cc of 4 mg/mL of dexamethasone into the A1 pulley area of the right middle finger.  The patient tolerated the procedure well and there are no complications.  She experienced immediate improvement in the tenderness.    F/u prn    Patient was seen and evaluated with Dr. SDaine Floras    EGuy Sandifer WElba Barman MD  Fellow, Hand &  Microvascular Surgery  Department of Orthopaedic Surgery  [place] [facility] Pager [phone]

## 2019-08-10 NOTE — Progress Notes (Signed)
This patient was seen, evaluated, and care plan was developed with the resident.  I agree with the assessment and plan as outlined in the resident's note.  Report electronically signed by:     Mansur Patti M. Balthazar Dooly, M.D., M.P.H.  Professor of Orthopaedics and Plastic Surgery  Chief, Hand & Upper Extremity Service

## 2019-08-10 NOTE — Nursing Note (Signed)
Pt vitals taken, screened for pain//T. Deveron Shamoon MA II    Pt was wearing a mask; Face shield; goggles/surgical mask used by myself//T. Ron Parker RN informed of pt elevated BP readings.

## 2019-08-19 MED FILL — lidocaine (PF) 10 mg/mL (1 %) injection solution: INTRAMUSCULAR | Qty: 1 | Status: AC

## 2019-08-19 MED FILL — dexAMETHasone sodium phosphate 4 mg/mL injection solution: INTRAMUSCULAR | Qty: 1 | Status: AC

## 2019-08-31 ENCOUNTER — Encounter: Payer: Self-pay | Admitting: Family Medicine

## 2019-08-31 ENCOUNTER — Ambulatory Visit: Payer: Medicare Other | Admitting: Family Medicine

## 2019-08-31 VITALS — BP 136/82 | HR 65 | Temp 97.9°F | Resp 20 | Wt 173.9 lb

## 2019-08-31 DIAGNOSIS — I129 Hypertensive chronic kidney disease with stage 1 through stage 4 chronic kidney disease, or unspecified chronic kidney disease: Secondary | ICD-10-CM

## 2019-08-31 DIAGNOSIS — F4321 Adjustment disorder with depressed mood: Secondary | ICD-10-CM

## 2019-08-31 DIAGNOSIS — F411 Generalized anxiety disorder: Secondary | ICD-10-CM

## 2019-08-31 DIAGNOSIS — N1831 Chronic kidney disease, stage 3a: Secondary | ICD-10-CM

## 2019-08-31 DIAGNOSIS — Z794 Long term (current) use of insulin: Secondary | ICD-10-CM

## 2019-08-31 DIAGNOSIS — I10 Essential (primary) hypertension: Secondary | ICD-10-CM

## 2019-08-31 DIAGNOSIS — E1122 Type 2 diabetes mellitus with diabetic chronic kidney disease: Secondary | ICD-10-CM

## 2019-08-31 NOTE — Progress Notes (Signed)
Paula Daugherty is a 10yrfemale who presents with a chief complaint of "I am coming to terms with the loss of my mother and how LLattie Hawbehaves".    Chief Complaint   Patient presents with    Diabetes    Mood Problems       1. Grief: over mother's loss last year; daughter also not speaking to her; PHQ9 and GAD 7 showing improved scores and she feels more at peace but some days are harder than others   2. DM2: FSBS over 120 on avg; we discussed porssbility of increasing Lantus by 2 units every 3 days; last A1c at goal; increased activity also encouraged   3. HTN: She reports .taking medications as instructed, no medication side effects noted, no TIA's, no chest pain on exertion, no dyspnea on exertion, no swelling of ankles; blood pressure reads: none      ROS:   .Constitutional: no fever/chills.  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 90 tablet 3    Atorvastatin (LIPITOR) 10 mg Tablet Take 1 tablet by mouth every day at bedtime. 90 tablet 3    Empagliflozin (JARDIANCE) 25 mg Tablet Take 1 tablet by mouth every day. 90 tablet 3    Insulin Glargine (LANTUS SOLOSTAR U-100 INSULIN) 100 unit/mL (3 mL) Pen Inject 40 units subcutaneously every night at bedtime. Please provide 3 month supply. 45 mL  3    Insulin Pen Needles, Disposable, 31 gauge x 5/16" For insulin administration once a day 100 each 3    Losartan (COZAAR) 25 mg Tablet Take 1 tablet by mouth every day. 90 tablet 1    Metformin (GLUCOPHAGE) 1,000 mg tablet Take 1 tablet by mouth 2 times daily with meals. (diabetes) 180 tablet 3    Omeprazole (PRILOSEC) 20 mg Delayed Release Capsule TAKE 1 CAPSULE BY MOUTH TWICE DAILY BEFORE MEALS 180 capsule 1    Ondansetron (ZOFRAN-ODT) 4 mg disintegrating tablet Take 2 tablets by mouth every 8 hours if needed. 50 tablet 2    ONETOUCH ULTRA BLUE TEST STRIP Strips Use to test blood glucose twice daily. (E11.9) 200 strip 3     No current facility-administered medications on file prior to visit.     Allergies:   Allergies   Allergen Reactions    Contrast Dye [Radiopaque Agent] Hives    Hydrochlorothiazide Other-Reaction in Comments     Patient reports    Januvia [Sitagliptin] Other-Reaction in Comments     Patient reports    Lyrica [Pregabalin] Other-Reaction in Comments  Patient reports    Omnipaque [Iohexol] Other-Reaction in Comments     Patient reports    Prozac [Fluoxetine Hcl] Other-Reaction in Comments     Patient reports    Sulfa (Sulfonamide Antibiotics) Rash    Topiramate Other-Reaction in Comments     Hairfall       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    Tobacco Use    Smoking status: Former Smoker     Packs/day: 0.10     Years: 20.00     Pack years: 2.00    Smokeless tobacco: Never Used    Tobacco comment: quit 30 years ago   Substance and Sexual Activity    Alcohol use: Yes     Alcohol/week: 0.0 standard drinks     Comment: rare    Drug use: No    Sexual activity: Yes     Partners: Male   Other Topics Concern    Not on file   Social History  Narrative    Not on file     Social Determinants of Health     Financial Resource Strain:     Difficulty of Paying Living Expenses: Not on file   Food Insecurity:     Worried About [place] in the Last Year: Not on file    Ran Out of Food in the Last Year: Not on file   Transportation Needs:     Lack of Transportation (Medical): Not on file    Lack of Transportation (Non-Medical): Not on file   Physical Activity:     Days of Exercise per Week: Not on file    Minutes of Exercise per Session: Not on file   Stress:     Feeling of Stress : Not on file   Social Connections:     Frequency of Communication with Friends and Family: Not on file    Frequency of Social Gatherings with Friends and Family: Not on file    Attends Religious Services: Not on file    Active Member of Clubs or Organizations: Not on file    Attends Archivist Meetings: Not on file    Marital Status: Not on file   Intimate Partner Violence:     Fear of Current or Ex-Partner: Not on file    Emotionally Abused: Not on file    Physically Abused: Not on file    Sexually Abused: Not on file     Family History   Problem Relation Name Age of Onset    Diabetes Father      Heart Mother          MI at 22s     Non-contributory Brother      Non-contributory Brother         PE:  BP 136/82 (SITE: right arm, Orthostatic Position: sitting, Cuff Size: large)   Pulse 65   Temp 36.6 C (97.9 F) (Temporal)   Resp 20   Wt 78.9 kg (173 lb 15.1 oz)   LMP 05/06/1983   BMI 31.81 kg/m   .General Appearance: healthy, alert, no distress, pleasant affect, cooperative.  Heart:  normal rate and regular rhythm, no  murmurs, clicks, or gallops.  Lungs: clear to auscultation.  Extremities:  no cyanosis, clubbing, or edema.  Mental Status: appropriate affect, behavior and mentation     Assessment and Plan:  (E11.21,  N18.31,  Z79.4) Type 2 diabetes mellitus with stage 3a chronic kidney disease, with long-term current use of insulin  (HCC)  (primary encounter diagnosis)  Comment: last A1c at goal; patient reported fasting glucose avg over 120   Plan: patient to consider increase in 2 units every 3 days for Lantus for fasting FSBS under 120     (I10) Essential hypertension  Comment: at goal; on an ARB    Plan: to monitor     (F43.21) Grief  Comment: lost mother last year; daughter Lattie Haw not speaking to patient; seeming more at peace and improved scores   Plan: to monitor     Number and Complexity of Problems Addressed  2 or more stable chronic illnesses  1 acute, uncomplicated illness or injury    Risk of Complications and/or Morbidity or Mortality of Patient Management  Moderate        Total encounter time including history, physical examination, and coordination of care was approximately 15 minutes of which more than 50% was spent counseling regarding assessment/diagnosis and treatment plan. No guarantees were made regarding her medical care or treatment outcome. Barriers to Learning: none.  Patient verbalizes understanding of teaching and instructions.    Electronically signed by:    [name], MD  [place], [place] Board of Family Medicine  Associate physician [facility], [place]   [phone]

## 2019-08-31 NOTE — Nursing Note (Signed)
Patient roomed, chief complaint noted, allergies verified, blood pressure, pulse, respiration, temperature, and weight obtained, screened for pain, and pharmacy verified.    2 Iyania Denne, M.A.    PPE Used During the Visit:    Eye and Face Protection:          Body Protection:   []Glasses/Goggles    []Gown   [x]Face Shield   [x]Surgical Mask   Hand Protection:   []N-95 Mask     []Gloves   []Particulate Respirator   []Half/Full Face Elastomeric Respirator   []PAPR    Pat ient:    [x]Surgical Mask  Marik Sedore, M.A.

## 2019-08-31 NOTE — Patient Instructions (Addendum)
Taking this medication requires a very specific calcium and 2000IU of vitamin D3 daily--calcium citrate 1200 mg daily, magnesium 250 mg daily and B12 sublingual 1030mg every other day due to absorption issue with meds like Omeprazole.  Also try to eat small meals throughout the day; avoid late meals, rich foods, caffeine, carbonated drinks, alcohol, chocolate, mints; sometimes you may not avoid all but try to not combine as many triggers and avoid NSAIDs--Motrin, Advil, Aleve.  Tylenol is ok.         No fasting for late June labs.     Please see about possibly getting Shingrix from outside pharmacy--repeat due after 2 mos with 1 booster to complete series of 2.

## 2019-09-07 ENCOUNTER — Other Ambulatory Visit: Payer: Self-pay | Admitting: GASTROENTEROLOGY

## 2019-09-07 DIAGNOSIS — C7A8 Other malignant neuroendocrine tumors: Secondary | ICD-10-CM

## 2019-09-21 ENCOUNTER — Encounter: Payer: Self-pay | Admitting: Family Medicine

## 2019-09-21 ENCOUNTER — Ambulatory Visit: Payer: Medicare Other | Admitting: Family Medicine

## 2019-09-21 VITALS — BP 146/80 | HR 62 | Temp 98.0°F | Resp 16 | Wt 172.2 lb

## 2019-09-21 DIAGNOSIS — K3184 Gastroparesis: Secondary | ICD-10-CM

## 2019-09-21 DIAGNOSIS — E1122 Type 2 diabetes mellitus with diabetic chronic kidney disease: Secondary | ICD-10-CM

## 2019-09-21 DIAGNOSIS — N1831 Chronic kidney disease, stage 3a: Secondary | ICD-10-CM

## 2019-09-21 DIAGNOSIS — K7581 Nonalcoholic steatohepatitis (NASH): Secondary | ICD-10-CM

## 2019-09-21 DIAGNOSIS — I10 Essential (primary) hypertension: Secondary | ICD-10-CM

## 2019-09-21 DIAGNOSIS — Z794 Long term (current) use of insulin: Secondary | ICD-10-CM

## 2019-09-21 DIAGNOSIS — Z79899 Other long term (current) drug therapy: Secondary | ICD-10-CM

## 2019-09-21 DIAGNOSIS — K007 Teething syndrome: Secondary | ICD-10-CM

## 2019-09-21 DIAGNOSIS — I129 Hypertensive chronic kidney disease with stage 1 through stage 4 chronic kidney disease, or unspecified chronic kidney disease: Secondary | ICD-10-CM

## 2019-09-21 NOTE — Nursing Note (Signed)
Patient roomed, chief complaint noted, allergies verified, blood pressure, pulse, respiration, temperature, and weight obtained, screened for pain, and pharmacy verified.    2 Jerimyah Vandunk, M.A.    PPE Used During the Visit:    Eye and Face Protection:          Body Protection:   []Glasses/Goggles    []Gown   [x]Face Shield   [x]Surgical Mask   Hand Protection:   []N-95 Mask     []Gloves   []Particulate Respirator   []Half/Full Face Elastomeric Respirator   []PAPR    Pat ient:    [x]Surgical Mask  Jedediah Noda, M.A.

## 2019-09-21 NOTE — Patient Instructions (Signed)
Please see about possibly getting Shingrix from outside pharmacy--repeat due after 2 mos with 1 booster to complete series of 2.

## 2019-09-21 NOTE — Progress Notes (Signed)
Paula Daugherty is a 18yrfemale who presents with a chief complaint of "erupted tooth needs to be removed".    Chief Complaint   Patient presents with    Form Request     dental work to be done       1. Erupted tooth: will need twilight type sedation; previously has has similar sedation for GI scopes without issues; patient would like to proceed with tooth removal as planned   2. NASH and gastroparesis: no new symptoms; seems to be at baseline   3. DM2: blood pressure slightly above range today; reports not sleeping as well; not motivated and without activity     ROS:   .Constitutional: no fever/chills; little energy   CV: no chest pain, palpitations, shortness of breath or edema.  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 90 tablet 3    Atorvastatin (LIPITOR) 10 mg Tablet Take 1 tablet by mouth every day at bedtime. 90 tablet 3    Empagliflozin (JARDIANCE) 25 mg Tablet Take 1 tablet by mouth every day. 90 tablet 3    Insulin Glargine (LANTUS SOLOSTAR U-100 INSULIN) 100 unit/mL (3 mL) Pen Inject 40 units subcutaneously every night at bedtime. Please provide 3 month supply. 45 mL 3    Losartan (COZAAR) 25 mg Tablet Take 1 tablet by mouth every day. 90 tablet 1    Metformin (GLUCOPHAGE) 1,000 mg tablet Take 1 tablet by mouth 2 times daily with meals. (diabetes) 180 tablet 3    Omeprazole (PRILOSEC) 20 mg Delayed  Release Capsule TAKE 1 CAPSULE BY MOUTH TWICE DAILY BEFORE MEALS 180 capsule 1    Ondansetron (ZOFRAN-ODT) 4 mg disintegrating tablet Take 2 tablets by mouth every 8 hours if needed. 50 tablet 2    ONETOUCH ULTRA BLUE TEST STRIP Strips Use to test blood glucose twice daily. (E11.9) 200 strip 3     No current facility-administered medications on file prior to visit.     Allergies:   Allergies   Allergen Reactions    Contrast Dye [Radiopaque Agent] Hives    Hydrochlorothiazide Other-Reaction in Comments     Patient reports    Januvia [Sitagliptin] Other-Reaction in Comments     Patient reports    Lyrica [Pregabalin] Other-Reaction in Comments     Patient reports    Omnipaque [Iohexol] Other-Reaction in Comments     Patient reports    Prozac [Fluoxetine Hcl] Other-Reaction in Comments     Patient reports    Sulfa (Sulfonamide Antibiotics) Rash    Topiramate Other-Reaction in Comments     Hairfall       Social History     Socioeconomic History  Marital status: MARRIED     Spouse name: Not on file    Number of children: 1    Years of education: Not on file    Highest education level: Not on file   Occupational History    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    Tobacco Use    Smoking status: Former Smoker     Packs/day: 0.10     Years: 20.00     Pack years: 2.00    Smokeless tobacco: Never Used    Tobacco comment: quit 30 years ago   Substance and Sexual Activity    Alcohol use: Yes     Alcohol/week: 0.0 standard drinks     Comment: rare    Drug use: No    Sexual activity: Yes     Partners: Male   Other Topics Concern    Not on file   Social History Narrative    Not on file     Social Determinants of Health     Financial Resource Strain:     Difficulty of Paying Living Expenses:    Food Insecurity:     Worried About Charity fundraiser in the Last Year:     Arboriculturist in the Last Year:    Transportation Needs:     Film/video editor (Medical):     Lack of Transportation  (Non-Medical):    Physical Activity:     Days of Exercise per Week:     Minutes of Exercise per Session:    Stress:     Feeling of Stress :    Social Connections:     Frequency of Communication with Friends and Family:     Frequency of Social Gatherings with Friends and Family:     Attends Religious Services:     Active Member of Clubs or Organizations:     Attends Music therapist:     Marital Status:    Intimate Partner Violence:     Fear of Current or Ex-Partner:     Emotionally Abused:     Physically Abused:     Sexually Abused:      Family History   Problem Relation Name Age of Onset    Diabetes Father      Heart Mother          MI at 4s     Non-contributory Brother      Non-contributory Brother         PE:  BP 146/80 (SITE: right arm, Orthostatic Position: sitting, Cuff Size: regular)   Pulse 62   Temp 36.7 C (98 F) (Temporal)   Resp 16   Wt 78.1 kg (172 lb 2.9 oz)   LMP 05/06/1983   BMI 31.49 kg/m   .General Appearance: healthy, alert, no distress, pleasant affect, cooperative.  Heart:  normal rate and regular rhythm, no murmurs, clicks, or gallops.  Lungs: clear to auscultation.  Extremities:  no cyanosis, clubbing, or edema.  Mental Status: appropriate affect, behavior and mentation; spouse by her side; both cracking jokes.  She feels situation with daughter is provoking her symptoms.     Assessment and Plan:  (K00.7) Tooth eruption  (primary encounter diagnosis)  Comment: no acute concerns but needs removal; patient appears at baseline with multiple medical problems   Plan: patient to proceed with plan for removal    (I10) Essential hypertension  Comment: recheck similar to initial read; previously at goal   Plan: to  monitor closely as she continue with current therapy; a routine exercise was encouraged     (E11.21,  N18.31,  Z79.4) Type 2 diabetes mellitus with stage 3a chronic kidney disease, with long-term current use of insulin ([place])  Comment: same  Plan: same      (K75.81) NASH (nonalcoholic steatohepatitis)  Comment: appears at baseline  Plan: to monitor     Number and Complexity of Problems Addressed  2 or more stable chronic illnesses  1 acute, uncomplicated illness or injury    Risk of Complications and/or Morbidity or Mortality of Patient Management  Moderate        Total encounter time including history, physical examination, and coordination of care was approximately 20 minutes of which more than 50% was spent counseling regarding assessment/diagnosis and treatment plan and completing form for oral surgeon.  No guarantees were made regarding her medical care or treatment outcome. Barriers to Learning: none.  Patient verbalizes understanding of teaching and instructions.    Electronically signed by:    [name], MD  [place], [place] Board of Family Medicine  Associate physician [facility], [place]   [phone]

## 2019-10-04 NOTE — Patient Instructions (Signed)
Thank you for choosing [facility]    Contact Information :    For advice calls : [phone]    If urgent you can text page [phone]    For general appointments call [phone] option 1.     To check the status of any diagnostics such as Physical therapy, MRI, CT call  [phone]

## 2019-10-05 ENCOUNTER — Ambulatory Visit: Payer: Medicare Other | Attending: Hand Surgery | Admitting: Hand Surgery

## 2019-10-05 ENCOUNTER — Encounter: Payer: Self-pay | Admitting: Hand Surgery

## 2019-10-05 VITALS — BP 141/80 | HR 68 | Temp 97.9°F | Resp 20 | Ht 62.0 in | Wt 173.5 lb

## 2019-10-05 DIAGNOSIS — M65331 Trigger finger, right middle finger: Secondary | ICD-10-CM

## 2019-10-05 DIAGNOSIS — M65332 Trigger finger, left middle finger: Secondary | ICD-10-CM

## 2019-10-05 NOTE — Progress Notes (Signed)
Orthopaedic Hand Surgery Follow-Up Note     Date: 10/05/19    Patient: Paula Daugherty    CC: Right middle trigger finger    Subjective:  Paula Daugherty is a 74 year-old RHD woman with a right middle trigger finger.  She was last seen 2 months ago and had an injection which gave her some relief but she still having some clicking and pain.  She will like to consider another injection.      Objective:     VITAL SIGNS:   Blood pressure 141/80, pulse 68, temperature 36.6 C (97.9 F), temperature source Skin, resp. rate 20, height 1.575 m (5' 2" ), weight 78.7 kg (173 lb 8 oz), last menstrual period 05/06/1983.    Exam today reveals:  Patient in no acute distress  Breathing non-labored    Examination of the right hand demonstrates no wounds or atrophy of the palmar space.  She has tenderness over the third A1 pulley site.  She has some clicking as well with flexion extension.  Fingers warm and well-perfused.        Assessment:   74 year old woman with right middle trigger finger.    Plan:   Patient like to trial another steroid injection which is reasonable.    Verbal consent obtained.  The right third A1 pulley site was sterilely prepped.  Using aseptic technique 1 cc of 1% lidocaine mixed with 1 cc of 4 mg/mL of dexamethasone was injected into the A1 pulley area of the right middle finger.  Patient tolerated procedure without complication.  Dr. Daine Floras performed the injection.    Patient will follow up as needed.      Milagros Loll., M.D.  Chief Resident  Department of Orthopaedic Surgery  Pager: [phone]  [facility]

## 2019-10-05 NOTE — Nursing Note (Signed)
Pt vitals taken, screened for pain//T. Sukhman Kocher MA II    Pt was wearing a mask; Face shield; goggles/surgical mask used by myself//T. Ron Parker RN informed of pt elevated BP readings.

## 2019-10-05 NOTE — Progress Notes (Signed)
This patient was seen, evaluated, and care plan was developed with the resident.  I agree with the assessment and plan as outlined in the resident's note.  Report electronically signed by:     Christopherjame Carnell M. Liani Caris, M.D., M.P.H.  Professor of Orthopaedics and Plastic Surgery  Chief, Hand & Upper Extremity Service

## 2019-10-13 MED FILL — dexAMETHasone sodium phosphate 4 mg/mL injection solution: INTRAMUSCULAR | Qty: 1 | Status: AC

## 2019-10-13 MED FILL — lidocaine (PF) 10 mg/mL (1 %) injection solution: INTRAMUSCULAR | Qty: 1 | Status: AC

## 2019-10-25 ENCOUNTER — Ambulatory Visit: Payer: Medicare Other | Attending: Family Medicine

## 2019-10-25 DIAGNOSIS — R140 Abdominal distension (gaseous): Secondary | ICD-10-CM

## 2019-10-25 DIAGNOSIS — R718 Other abnormality of red blood cells: Secondary | ICD-10-CM | POA: Insufficient documentation

## 2019-10-25 LAB — TRANSFERRIN
Iron Percent Saturation: 15.2 % (ref 15.0–50.0)
Iron Total: 59 ug/dL (ref 42–135)
Total Iron Binding Capacity: 388 ug/mL (ref 280–400)
Transferrin: 279 mg/dL (ref 192–382)

## 2019-10-25 LAB — RETICULOCYTE STUDIES
Corrected Retic Count: 1.1 % (ref 0.4–1.5)
Hematocrit: 37.7 % (ref 36.0–46.0)
Red Blood Cell Count: 4.76 10*6/uL (ref 4.00–5.20)
Reticulocyte Count %: 1.33 % (ref 0.40–2.40)
Reticulocyte Count Abs: 63.31 10*3/uL (ref 18.50–82.50)

## 2019-10-25 LAB — TSH WITH FREE T4 REFLEX: Thyroid Stimulating Hormone: 1.91 u[IU]/mL (ref 0.35–3.30)

## 2019-10-25 LAB — IRON TOTAL: Iron Total: 59 ug/dL (ref 42–135)

## 2019-10-25 LAB — FERRITIN: Ferritin: 10 ng/mL (ref 10–291)

## 2019-11-01 ENCOUNTER — Ambulatory Visit: Payer: Medicare Other | Attending: "Endocrinology | Admitting: "Endocrinology

## 2019-11-01 DIAGNOSIS — E119 Type 2 diabetes mellitus without complications: Secondary | ICD-10-CM | POA: Insufficient documentation

## 2019-11-01 DIAGNOSIS — E1122 Type 2 diabetes mellitus with diabetic chronic kidney disease: Secondary | ICD-10-CM | POA: Insufficient documentation

## 2019-11-01 DIAGNOSIS — Z794 Long term (current) use of insulin: Secondary | ICD-10-CM | POA: Insufficient documentation

## 2019-11-01 DIAGNOSIS — E1129 Type 2 diabetes mellitus with other diabetic kidney complication: Secondary | ICD-10-CM

## 2019-11-01 DIAGNOSIS — N183 Chronic kidney disease, stage 3 unspecified: Secondary | ICD-10-CM | POA: Insufficient documentation

## 2019-11-01 DIAGNOSIS — R809 Proteinuria, unspecified: Secondary | ICD-10-CM | POA: Insufficient documentation

## 2019-11-01 DIAGNOSIS — E278 Other specified disorders of adrenal gland: Secondary | ICD-10-CM | POA: Insufficient documentation

## 2019-11-01 LAB — POC HEMOGLOBIN A1C: POC HEMOGLOBIN A1C: 6.8 % — AB (ref 4.2–6.5)

## 2019-11-01 MED ORDER — METFORMIN 1,000 MG TABLET: 1000.0000 mg | ORAL_TABLET | Freq: Two times a day (BID) | ORAL | 3 refills | Status: DC

## 2019-11-01 MED ORDER — EMPAGLIFLOZIN 25 MG TABLET
25.0000 mg | ORAL_TABLET | Freq: Every day | ORAL | 3 refills | Status: DC
Start: 2019-11-01 — End: 2020-10-23

## 2019-11-01 MED ORDER — ATORVASTATIN 10 MG TABLET: 10.0000 mg | ORAL_TABLET | Freq: Every day | ORAL | 3 refills | Status: DC

## 2019-11-01 MED ORDER — LANTUS SOLOSTAR U-100 INSULIN 100 UNIT/ML (3 ML) SUBCUTANEOUS PEN: PEN_INJECTOR | SUBCUTANEOUS | 3 refills | Status: DC

## 2019-11-01 NOTE — Nursing Note (Signed)
Vital signs taken, allergies verified, screened for pain, tobacco hx verified. Pt has no additional questions or concerns prior to seeing physician.   Wasif Simonich MA II  Patient WAS wearing a surgical mask  Contact precautions were followed when caring for the patient.   PPE used by provider during encounter: Surgical mask

## 2019-11-01 NOTE — Nursing Note (Signed)
Order for POC A1c in system and performed and completed by Terri Skains - MA II. Results given to Dr Rosary Lively

## 2019-11-01 NOTE — Progress Notes (Deleted)
Order for POC A1c in system and performed and completed by Terri Skains - MA II. Results given to Dr Rosary Lively

## 2019-11-02 ENCOUNTER — Encounter: Payer: Self-pay | Admitting: "Endocrinology

## 2019-11-02 ENCOUNTER — Telehealth: Payer: Self-pay | Admitting: "Endocrinology

## 2019-11-02 ENCOUNTER — Ambulatory Visit: Payer: Medicare Other | Attending: "Endocrinology

## 2019-11-02 DIAGNOSIS — E1129 Type 2 diabetes mellitus with other diabetic kidney complication: Secondary | ICD-10-CM | POA: Insufficient documentation

## 2019-11-02 DIAGNOSIS — Z794 Long term (current) use of insulin: Secondary | ICD-10-CM | POA: Insufficient documentation

## 2019-11-02 LAB — MICROALBUMIN
Creatinine Spot Urine: 150.47 mg/dL
Microalbumin Urine: 16.9 mg/dL
Microalbumin/Creatinine Ratio: 112 mg/g — ABNORMAL HIGH (ref <30)

## 2019-11-02 LAB — CREATININE SPOT URINE: Creatinine Spot Urine: 150.47 mg/dL

## 2019-11-02 NOTE — Telephone Encounter (Signed)
TigerTexted Dr. Rosary Lively.  Ok to run culture only.  Urine Culture ordered.     Jaydan Chretien, RN

## 2019-11-02 NOTE — Telephone Encounter (Signed)
Spoke with Gregary Signs from [facility] laboratory. She stated patient was unable to complete the entire urinalysis  however they were able to collect the culture. She wants to confirm if this will be enough.    Please advise. Gregary Signs can be reached at [phone].    Thank you,  Clair Gulling  [facility]  [place]  [phone]

## 2019-11-03 ENCOUNTER — Encounter: Payer: Self-pay | Admitting: "Endocrinology

## 2019-11-03 ENCOUNTER — Other Ambulatory Visit: Payer: Self-pay | Admitting: "Endocrinology

## 2019-11-03 DIAGNOSIS — E1129 Type 2 diabetes mellitus with other diabetic kidney complication: Secondary | ICD-10-CM

## 2019-11-03 LAB — CULTURE URINE, BACTI: URINE CULTURE: NO GROWTH

## 2019-11-03 MED ORDER — PEN NEEDLE, DIABETIC 32 GAUGE X 5/32"
11 refills | Status: DC
Start: 2019-11-03 — End: 2020-02-28

## 2019-11-03 NOTE — Telephone Encounter (Signed)
From: [name]  To: Minna Merritts, MD  Sent: 11/03/2019 11:38 AM PDT  Subject: Visit Follow-up Question    Hi Dr. Here is a picture of the box of needles I currently have. Just let me know if you want to change them to something else.     Take care, [name]

## 2019-11-09 ENCOUNTER — Encounter: Payer: Self-pay | Admitting: GASTROENTEROLOGY

## 2019-11-09 MED ORDER — ONDANSETRON 4 MG DISINTEGRATING TABLET: 8.0000 mg | DISINTEGRATING_TABLET | Freq: Three times a day (TID) | ORAL | 2 refills | Status: DC | PRN

## 2019-11-09 NOTE — Telephone Encounter (Signed)
-----   Message from Belva Agee, RN sent at 11/09/2019 11:46 AM PDT -----  Regarding: FW: Visit Follow-up Question  Contact: [phone]      ----- Message -----  From: [name]  Sent: 11/09/2019   9:54 AM PDT  To: [facility] Triage  Subject: Visit Follow-up Question                         Hi Eric, I was contacted by [facility] and asked to contact you.  Seems they have been trying reach out to you for a new Rx for my Zofran 65m that I take orally.  I was hoping that you could send a Rx when you have a minute.      Thank you and take care.

## 2019-11-09 NOTE — Telephone Encounter (Signed)
From: [name]  To: Georgeanne Nim, MD  Sent: 11/09/2019 9:54 AM PDT  Subject: Visit Follow-up Question    Hi Randall Hiss, I was contacted by [facility] and asked to contact you. Seems they have been trying reach out to you for a new Rx for my Zofran 41m that I take orally. I was hoping that you could send a Rx when you have a minute.     Thank you and take care.

## 2019-11-09 NOTE — Telephone Encounter (Signed)
Patient has only been seen once in clinic (by D. Hamilton) 07/29/17. Patient was last seen by Dr. Virgel Bouquet in GI lab on 04/08/2019.    Will pend refill request to MD to approve or deny.    Edythe Lynn, LVN

## 2019-11-10 NOTE — Progress Notes (Signed)
Endocrinology Follow up clinic note:    Chief Complaint:  "I'm here to follow up on my diabetes."    HPI: Paula Daugherty is a 74yrold female who has a diagnosis of type 2 diabetes mellitus who presents to Endocrinology for follow up. Her history is as follows:  She was initially diagnosed with diabetes around 2000 on routine labs. She has a strong family history of diabetes so she wasn't surprised at this. She was started on Metformin around 2005 and then she was started on insulin in 2014. She was up to 64 units of Lantus at night. However, after making changes to her diet and lifestyle, she was taken off insulin in 2015. Basal insulin was added back to her regimen in 2018.      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    Interim History: She returns today for follow up. Her last visit with Endocrinology was on 07/26/2019. Since this visit, she states that overall, she has been doing ok. She has noted some increased bruising and burning with her insulin injections. She is keeping the unused pens in the refrigerator until these are ready to be used and then the pens are kept at room temperature.  She also has noted some difficulty with increased urination and increase incontinence, decreased appetite (and craving fresh salsa) and decreased sleep with early morning waking.     Current Regimen:              Lantus 38 units QHS              Metformin 1000 mg BID              Jardiance 25 mg daily  Insulin injection technique: Holds the needle in for a few seconds with each injection  Injection sites: Abdomen  Rotates sites: Yes  Discards insulin 30 days after opening: Yes  Hypoglycemia: Has been significantly less lately              Awareness: Yes, when blood glucose is in the 80s  Hyperglycemia: Almost never sees numbers >180     Meter brought today: Yes  Home blood glucose log brought to clinic today: No  Checks blood glucose: 2x/day  Last eye exam: 11/09/2018 no diabetic  retinopathy, f/u scheduled in 12/2019  Foot exam: Will do today            ROS:  All other systems were reviewed and are negatative except for pertinent positive and negative responses as documented in HPI.     Medications:  Medication reconciliation was performed today.   Outpatient Medications Marked as Taking for the 11/01/19 encounter (Office Visit) with SCorky Mull MD   Medication Sig Dispense Refill    Amlodipine (NORVASC) 10 mg Tablet Take 1 tablet by mouth every day. 90 tablet 3    Atorvastatin (LIPITOR) 10 mg Tablet Take 1 tablet by mouth every day at bedtime. 90 tablet 3    Empagliflozin (JARDIANCE) 25 mg Tablet Take 1 tablet by mouth every day. 90 tablet 3    Insulin Glargine (LANTUS SOLOSTAR U-100 INSULIN) 100 unit/mL (3 mL) Pen Inject 40 units subcutaneously every night at bedtime. Please provide 3 month supply. 45 mL 3    Losartan (COZAAR) 25 mg Tablet Take 1 tablet by mouth every day. 90 tablet 1    Metformin (GLUCOPHAGE) 1,000 mg tablet Take 1 tablet by mouth 2 times daily with meals. (diabetes) 180  tablet 3    Omeprazole (PRILOSEC) 20 mg Delayed Release Capsule TAKE 1 CAPSULE BY MOUTH TWICE DAILY BEFORE MEALS 180 capsule 1    ONETOUCH ULTRA BLUE TEST STRIP Strips Use to test blood glucose twice daily. (E11.9) 200 strip 3    [DISCONTINUED] Ondansetron (ZOFRAN-ODT) 4 mg disintegrating tablet Take 2 tablets by mouth every 8 hours if needed. 50 tablet 2     I did review patient's past medical and family/social history, no changes noted.   PMH:  Past medical history was reviewed from problem list.   Patient Active Problem List   Diagnosis    DM2 (diabetes mellitus, type 2) ([place])    Depression with anxiety    Stress at home    Leg cramps-right calf    Right ear pain    Fibromyalgia    HTN (hypertension)    GERD (gastroesophageal reflux disease)    Hyperlipidemia with target LDL less than 70    Vitamin D insufficiency    Abnormal LFTs    Renal cyst ( 6.4 cm cyst left  kidney)    Cough    Fatty liver    Macroalbuminuric diabetic nephropathy (HCC)    History of actinic keratoses    Cataract    Ptosis of eyelid    Liver fibrosis    Adrenal nodule (HCC)    Medication Therapy Auth    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HCC)    Mixed conductive and sensorineural hearing loss of both ears    Neoplasm of uncertain behavior of skin    Abdominal pain, generalized    Nausea without vomiting    Asymptomatic microscopic hematuria    Subcutaneous nodule    Angina pectoris (HCC)    Mild episode of recurrent major depressive disorder (HCC)    NASH (nonalcoholic steatohepatitis)     VITAL SIGNS:  BP 144/79 (SITE: right arm, Orthostatic Position: sitting, Cuff Size: regular)   Pulse 74   Temp 36.2 C (97.1 F) (Temporal)   Resp 16   Ht 1.575 m (5' 2" )   Wt 78.2 kg (172 lb 6.4 oz)   LMP 05/06/1983   SpO2 97%   BMI 31.53 kg/m   Body mass index is 31.53 kg/m.    PHYSICAL EXAM:  General Appearance: healthy, alert, no distress, pleasant affect, cooperative.  Eyes:  conjunctivae and corneas clear. PERRL, EOM's intact. sclerae normal.  Neck:  Neck supple.   Heart:  normal rate and regular rhythm, no murmurs, clicks, or gallops.  Lungs: clear to auscultation.  Abdomen: BS normal.  Abdomen soft.  No masses or organomegaly.  Extremities:  no cyanosis, clubbing, or edema.  Foot Exam: normal DP and PT pulses, no trophic changes or ulcerative lesions, normal sensory exam and normal monofilament exam.  Skin:  Skin color, texture, turgor normal. No rashes or lesions.  Neuro: Gait normal. No tremor appreciated. Sensation and strength grossly normal.  Mental Status: Appearance/Cooperation: in no apparent distress and well developed and well nourished  Eye Contact: normal  Speech: normal volume, rate, and pitch    LAB TESTS/STUDIES:   I personally reviewed the following laboratory and imaging studies.   06/01/2019 11:36 07/22/2019 09:48 10/25/2019 09:06   SODIUM 142     POTASSIUM  4.3     CHLORIDE 104     CARBON DIOXIDE TOTAL 25     UREA NITROGEN, BLOOD (BUN) 15     CREATININE BLOOD 1.02     GLUCOSE 121 (H)  CALCIUM 9.6     PROTEIN 8.1     ALBUMIN 3.9     ALKALINE PHOSPHATASE (ALP) 100     ASPARTATE TRANSAMINASE (AST) 70 (H)     BILIRUBIN TOTAL 1.1     ALANINE TRANSFERASE (ALT) 51     E-GFR (Female) 55     E-GFR, African American (Female) 64     LIPASE 37     IRON TOTAL   59   TRANSFERRIN   279   TOTAL IRON BINDING CAPACITY   388   IRON PERCENT SATURATION   15.2   FERRITIN   10   THYROID STIMULATING HORMONE   1.91   HGB A1C  6.7 (H)    HGB A1C,GLUCOSE EST AVG  146       06/01/2019 11:36   White Blood Cell Count 7.8   Hemoglobin 12.2   Hematocrit 37.4   MCV 77.2 (L)   RDW 17.2 (H)   Platelet Count 184      11/01/2019 14:39   POC HEMOGLOBIN A1C 6.8 (Abnl)      04/20/2019 11:02   CREATININE SPOT URINE 134.48   MICROALBUMIN URINE 58.0   MICROALBUMIN/CREATININE RATIO 431 (H)     01/27/2019:    Ref Range & Units     TOTAL VOLUME mL 1220    TIME OF COLLECTION hr 24    CORTISOL,UR FREE PER VOLUME ug/L 7.70    CORTISOL,UR FREE PER 24HR <=45.0 ug/d 9.4    CORTISOL,UR FREE RATIO TO CRT ug/g CRT 9.39         Ref Range & Units     TOTAL VOLUME mL 1220    TIME OF COLLECTION hr 24    METANEPHRINE,UR-PER VOLUME ug/L 96    METANEPHRINE,UR-PER 24HR 36 - 229 ug/d 117    METANEPHRINE,UR-RATIO TO CRT 0 - 300 ug/g CRT 113    NORMETANEPHRINE,UR-PER VOLUME ug/L 383    NORMETANEPHRINE,UR-PER 24HR 95 - 650 ug/d 467    NORMETANEPHRIN,UR-RATIO TO CRT 0 - 400 ug/g CRT 451High     METANEPHRINES INTERPRETATION   See Note       CT Abdomen and Pelvis (04/08/2017):  FINDINGS:  URINARY COLLECTING SYSTEM:  Renal stones: None.  Ureteral stones: None.  Bladder stones: None.  Solid / enhancing renal mass: None. Left interpolar cyst with thin  peripheral calcification. Additional subcentimeter hypodensities, too small  to characterize but statistically likely benign.  Renal enhancement: Normal symmetric enhancement of the  kidneys.  There is segmental nonopacification of the bilateral distal ureters.  Hydronephrosis: None.  Hydroureter: None.  Renal collecting system: No intraluminal filling defects.  Ureters: No intraluminal filling defects in the opacified segments of the  ureters. Segments of the ureter that are not opacified do not show any wall  thickening, suspicion of an enhancing mass, or focal or segmental  dilatation.  Urinary bladder: No focal or asymmetric bladder wall thickening, focal  filling defects, or enhancing polypoid lesions.  REMAINING ABDOMEN AND PELVIS:  Lower Chest: Heavy coronary artery calcification. Bibasilar atelectasis.  Liver: Unchanged hepatic steatosis and hepatomegaly.  Bile Ducts: Unremarkable.  Gallbladder: Unremarkable.  Pancreas: Unremarkable.  Spleen: Unremarkable.  Adrenal Glands: Unchanged 1.8 cm left adenoma.  GI Tract: Diverticulosis without evidence of acute diverticulitis.  Peritoneal Cavity: No free fluid or free air.  Uterus and Ovaries: Status post hysterectomy. No adnexal masses.  Lymph Nodes: Unchanged 1.3 cm porta hepatic lymph node. Unchanged  additional smaller upper abdominal lymph nodes.  Major Vascular Structures:  Moderate aortoiliac artery calcification.  Soft Tissues: Unchanged partially calcified left gluteal soft tissue  densities, likely injection granulomas.  Musculoskeletal: No suspicious bony lesions or acute fractures. Mild  multilevel degenerative changes of the visualized spine.     IMPRESSION:  1. No cause for hematuria. No urolithiasis, renal mass, or  uroepithelial mass in the kidneys, ureters, or urinary bladder.  2. No hydronephrosis or hydroureter.  3. Unchanged 1.8 cm left adrenal adenoma.    Impression: This is a 74yrold female with Diabetes Mellitus Type II who presents to Endocrinology for follow up. Overall, her glycemic control is at goal as evidenced by her POC A1c today of 6.9%.  Review of her blood glucose meter download today demonstrates overall well  controlled blood glucose with minimal hyperglycemia and no hypoglycemia. Because of this, I have not recommended any changes to her current medication doses.    She was encouraged to report any hypoglycemia or persistent hyperglycemia prior to follow up.   For her difficulties with insulin injections, she will send me her current insulin type and we will try a slightly smaller needle to see if this improves her symptoms.   I have also ordered a urine culture test now for the complaints of increased urinary frequency.      She also has a history of an adrenal nodule. Hormonal evaluation was normal in 01/2019 and she will be due for repeat hormonal evaluation in 01/2020.     DIABETES HISTORY  (-) h/o retinopathy.      (-) h/o microalbuminuria/nephropathy.  (-) h/o neuropathy.  (-) h/o Autonomic dysfunction  (-) h/o Gastroparesis  (-) h/o CAD  (-) h/o PVD     Last eye exam: 11/2018, f/u scheduled for 12/2019  Last urine microalbumin: 04/2019, 431  Pneumovax: 12/2014, Prevnar 2018  Influenza vaccine: 12/2018  (-) ASA  (+) Statin  (+) ACE/ARB          Recommendations:  (E11.29) Type 2 diabetes mellitus with other diabetic kidney complication  (primary encounter diagnosis)  - Continue current dose of metformin, Jardiance, and Lantus  - Encouraged dietary changes and increased exercise  - Encouraged patient to continue blood glucose monitoring to at least 1x/day, goal to check 2x/day  - Last LDL at goal, continue statin, repeat due 04/2020  - Last microalbumin:creatinine ratio elevated, continue cozaar, repeat nw  - Up to date on screening eye exam, follow up as scheduled in 12/2019  - Repeat A1c prior to follow up appointment     2. Adrenal nodule  - Biochemical evaluation normal in 01/2019, stability by imaging in 04/2017  - Repeat hormonal evaluation due in the fall (~1 year from previous) for 5 years total (2022)      Follow up in 3 months    If you have any questions, please do not hesitate to contact me at  [phone]  Thank you for allowing me to participate in the care of this patient.    EDUCATION:  I educated/instructed the patient or caregiver regarding all aspects of the above stated plan of care.  The patient or caregiver indicated understanding.      UDozierinterpreter was not used.    Report electronically signed by:  DSunday Spillers M.D.  Clinical Associate Professor  Department of Endocrinology    Patient WAS wearing a surgical mask  Droplet precautions were followed when caring for the patient.   PPE used by provider during encounter: Surgical mask

## 2019-11-16 ENCOUNTER — Telehealth: Payer: Self-pay | Admitting: GASTROENTEROLOGY

## 2019-11-16 ENCOUNTER — Other Ambulatory Visit: Payer: Self-pay | Admitting: GASTROENTEROLOGY

## 2019-11-16 MED ORDER — ONDANSETRON 4 MG DISINTEGRATING TABLET: 4.0000 mg | DISINTEGRATING_TABLET | Freq: Three times a day (TID) | ORAL | 1 refills | Status: DC | PRN

## 2019-11-16 MED ORDER — ONDANSETRON HCL 4 MG TABLET
4.0000 mg | ORAL_TABLET | Freq: Three times a day (TID) | ORAL | 1 refills | Status: AC | PRN
Start: 2019-11-16 — End: 2020-11-10

## 2019-11-16 NOTE — Telephone Encounter (Signed)
Called pt. Spoke with [name] using 3 pt identifiers.     RE: Follow-up on zofran refill request    Last refill order 11/09/2019:      Ondansetron (ZOFRAN-ODT) 4 mg disintegrating tablet [[id number]]    Order Details  Dose: 8 mg Route: ORAL Frequency: EVERY 8 HOURS IF NEEDED   Dispense Quantity: 50 tablet Refills: 2         Sig: Take 2 tablets by mouth every 8 hours if needed       Pt states she normally gets refill for Zofran oral tablets in addition to zofran disintegrating tablets, states current quantity of Zofran disintegrating tablets will not be enough for her. Pt would like to provider to send refills for Zofran tablets to [facility] as well.       Will route to Dr. Virgel Bouquet for review and response.       Thank you,   Shaqueta Casady, [facility]  [phone]

## 2019-11-16 NOTE — Telephone Encounter (Signed)
New orders for Ondansetron (ZOFRAN) 4 mg Tablet sent to [facility] #[id number] - [place], [place], [phone].    Sent message to pt via mychart.     Thanks,   Passenger transport manager, [facility]  [phone]

## 2019-12-14 ENCOUNTER — Ambulatory Visit: Payer: Medicare Other | Admitting: Family Medicine

## 2019-12-14 ENCOUNTER — Encounter: Payer: Self-pay | Admitting: Family Medicine

## 2019-12-14 VITALS — BP 129/68 | HR 67 | Temp 97.5°F | Resp 18 | Ht 62.0 in | Wt 172.4 lb

## 2019-12-14 DIAGNOSIS — N1831 Chronic kidney disease, stage 3a: Secondary | ICD-10-CM

## 2019-12-14 DIAGNOSIS — I129 Hypertensive chronic kidney disease with stage 1 through stage 4 chronic kidney disease, or unspecified chronic kidney disease: Secondary | ICD-10-CM

## 2019-12-14 DIAGNOSIS — C7A8 Other malignant neuroendocrine tumors: Secondary | ICD-10-CM

## 2019-12-14 DIAGNOSIS — E1122 Type 2 diabetes mellitus with diabetic chronic kidney disease: Secondary | ICD-10-CM

## 2019-12-14 DIAGNOSIS — I10 Essential (primary) hypertension: Secondary | ICD-10-CM

## 2019-12-14 DIAGNOSIS — Z794 Long term (current) use of insulin: Secondary | ICD-10-CM

## 2019-12-14 MED ORDER — LOSARTAN 25 MG TABLET
25.0000 mg | ORAL_TABLET | Freq: Every day | ORAL | 1 refills | Status: DC
Start: 2019-12-14 — End: 2020-06-04

## 2019-12-14 NOTE — Nursing Note (Signed)
Arrived with self. Vital signs taken, allergies verified, screened for pain, med hx taken.   Age appropriate, interactive and cooperative. No acute distress noted at this time.     Patient WAS wearing a surgical mask.   Contact precautions were followed when caring for the patient.   PPE used by provider during encounter: Surgical mask       Wyatt Thorstenson, MA.   Medical Assistant II

## 2019-12-14 NOTE — Progress Notes (Signed)
Paula Daugherty is a 57yrfemale who presents with a chief complaint of "doing okay".    Chief Complaint   Patient presents with    Follow-up     3 Month DM       1. DM2: happy with blood pressure; weight is a plateau; we discussed increased exercise would help; due for eye xam and has appointment   2. History of neuroendocrine CA small bowel; due to recheck at end of this year; patient has plans to schedule this month   3. GHCM: SIngrix is too expensive; aware of need for this ASAP; urged to check with county clinic        ROS:   .Constitutional: no fever/chills.  CV: no chest pain, palpitations, shortness of breath or edema  Fibromyalgia with some symptoms; will start water exercises; misses her mother who passed in the last year      Past Medical History:   Diagnosis Date    [place]ina pectoris ([facility] 09/23/2017    Anxiety     Asthma     Cirrhosis ([facility]     Constipation, chronic     Depression     Diabetes mellitus ([place]     Fatty liver     Hypertension     Kidney disease 2015    cyst left kidney per pt    Liver fibrosis     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/20/2016    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[facility] 04/17/2016    Psychiatric illness        Current Outpatient Medications on File Prior to Visit   Medication Sig Dispense Refill    Amlodipine (NORVASC) 10 mg Tablet Take 1 tablet by mouth every day. 90 tablet 3    Atorvastatin (LIPITOR) 10 mg Tablet Take 1 tablet by mouth every day at bedtime. 90 tablet 3    Empagliflozin (JARDIANCE) 25 mg Tablet Take 1 tablet by mouth every day. 90 tablet 3    Insulin Glargine (LANTUS SOLOSTAR U-100 INSULIN) 100 unit/mL (3 mL) Pen Inject 40 units subcutaneously every night at bedtime. Please provide 3 month supply. 45 mL 3    Insulin Needles, Disposable, (NANO PEN NEEDLE) 32 gauge x 5/32" Needle Use to inject insulin one time per day 100 each 11    Metformin (GLUCOPHAGE) 1,000 mg tablet Take 1 tablet by mouth 2 times daily with meals. (diabetes)  180 tablet 3    Omeprazole (PRILOSEC) 20 mg Delayed Release Capsule TAKE 1 CAPSULE BY MOUTH TWICE DAILY BEFORE MEALS 180 capsule 1    Ondansetron (ZOFRAN) 4 mg Tablet Take 1 tablet by mouth every 8 hours if needed. 50 tablet 1    Ondansetron (ZOFRAN-ODT) 4 mg disintegrating tablet Take 1 tablet by mouth every 8 hours if needed. 50 tablet 1    ONETOUCH ULTRA BLUE TEST STRIP Strips Use to test blood glucose twice daily. (E11.9) 200 strip 3     No current facility-administered medications on file prior to visit.     Allergies:   Allergies   Allergen Reactions    Contrast Dye [Radiopaque Agent] Hives    Hydrochlorothiazide Other-Reaction in Comments     Patient reports    Januvia [Sitagliptin] Other-Reaction in Comments     Patient reports    Lyrica [Pregabalin] Other-Reaction in Comments     Patient reports    Omnipaque [Iohexol] Other-Reaction in Comments     Patient reports    Prozac [Fluoxetine Hcl] Other-Reaction in Comments  Patient reports    Sulfa (Sulfonamide Antibiotics) Rash    Topiramate Other-Reaction in Comments     Hairfall       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    Tobacco Use    Smoking status: Former Smoker     Packs/day: 0.10     Years: 20.00     Pack years: 2.00    Smokeless tobacco: Never Used    Tobacco comment: quit 30 years ago   Substance and Sexual Activity    Alcohol use: Yes     Alcohol/week: 0.0 standard drinks     Comment: rare    Drug use: No    Sexual activity: Yes     Partners: Male   Other Topics Concern    Not on file   Social History Narrative    Not on file     Social Determinants of Health     Financial Resource Strain:     Difficulty of Paying Living Expenses: Not on file   Food Insecurity:     Worried About [place] in the Last Year: Not on file    Ran Out of Food in the Last Year: Not on file   Transportation Needs:     Lack of Transportation (Medical): Not on file    Lack of Transportation (Non-Medical): Not on file   Physical Activity:     Days of Exercise per Week: Not on file    Minutes of Exercise per Session: Not on file   Stress:     Feeling of Stress : Not on file   Social Connections:     Frequency of Communication with Friends and Family: Not on file    Frequency of Social Gatherings with Friends and Family: Not on file    Attends Religious Services: Not on file    Active Member of Clubs or Organizations: Not on file    Attends Archivist Meetings: Not on file    Marital Status: Not on file   Intimate Partner Violence:     Fear of Current or Ex-Partner: Not on file    Emotionally Abused: Not on file    Physically Abused: Not on file    Sexually Abused: Not on file     Family History   Problem Relation Name Age of Onset    Diabetes Father      Heart Mother          MI at 36s     Non-contributory Brother      Non-contributory Brother         PE:  BP 129/68 (SITE: right arm, Orthostatic Position: sitting, Cuff Size: regular)   Pulse 67   Temp 36.4 C (97.5 F) (Temporal)   Resp 18   Ht 1.575 m (5' 2" )   Wt 78.2 kg (172 lb 6.4 oz)   LMP 05/06/1983   BMI 31.53 kg/m   .General Appearance: healthy, alert, no distress, pleasant affect, cooperative.  Heart:  normal rate and regular rhythm, no murmurs, clicks, or gallops.  Lungs: clear to auscultation.  Extremities:  no cyanosis, clubbing, or edema--using open toe sandals and aware  closed toe shoes are best.  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 No guarantees were made regarding her medical care or treatment outcome. Barriers to Learning: none.  Patient verbalizes understanding of teaching and instructions.    Electronically signed by:    [name], MD  [place], [place] Board of Family Medicine  Associate physician [facility], [place]   [phone]

## 2019-12-14 NOTE — Patient Instructions (Signed)
Please see about possibly getting Shingrix from outside pharmacy--repeat due after 2 mos with 1 booster to complete series of 2.

## 2019-12-26 ENCOUNTER — Ambulatory Visit: Payer: Vision Other Private Insurance | Admitting: Optometrist

## 2019-12-26 DIAGNOSIS — E119 Type 2 diabetes mellitus without complications: Secondary | ICD-10-CM

## 2019-12-26 DIAGNOSIS — Z961 Presence of intraocular lens: Secondary | ICD-10-CM

## 2019-12-26 DIAGNOSIS — H524 Presbyopia: Secondary | ICD-10-CM

## 2019-12-26 NOTE — Progress Notes (Signed)
Chief Complaint   Patient presents with    Eye Glasses / Contact Lens Rx       HPI     74 yo F here for CEE. CC: Pt reports slight overall decrease in [place] w/glasses (possibly from poor adjustment). Also, c/o itchy eyes OU. Type 2 DM - A1C 6.7 (March 2021) - monitoring MA OS.    Last edited by Otilio Jefferson, [place] on 12/26/2019  9:21 AM. (History)          Lab Results   Lab Name Value Date/Time    HGBA1C 6.7 (H) 07/22/2019 09:48 AM    HGBA1C 6.3 (H) 04/20/2019 10:29 AM    HGBA1C 6.7 (H) 12/23/2018 10:10 AM    HGBA1C 6.6 (H) 08/30/2015 08:35 AM    HGBA1C 7.7 (H) 03/23/2015 09:04 AM    HGBA1C 6.9 (H) 12/06/2014 11:45 AM       Patient Active Problem List   Diagnosis    DM2 (diabetes mellitus, type 2) ([place])    Depression with anxiety    Stress at home    Leg cramps-right calf    Right ear pain    Fibromyalgia    HTN (hypertension)    GERD (gastroesophageal reflux disease)    Hyperlipidemia with target LDL less than 70    Vitamin D insufficiency    Abnormal LFTs    Renal cyst ( 6.4 cm cyst left kidney)    Cough    Fatty liver    Macroalbuminuric diabetic nephropathy (HCC)    History of actinic keratoses    Cataract    Ptosis of eyelid    Liver fibrosis    Adrenal nodule ([place])    Medication Therapy Auth    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HCC)    Mixed conductive and sensorineural hearing loss of both ears    Neoplasm of uncertain behavior of skin    Abdominal pain, generalized    Nausea without vomiting    Asymptomatic microscopic hematuria    Subcutaneous nodule    Angina pectoris (HCC)    Mild episode of recurrent major depressive disorder (HCC)    NASH (nonalcoholic steatohepatitis)       POH:  Status post blepharoplasty OU.   Status post CE OU    Assessment/Plan:    1. Presbyopia.  Released a glasses prescription to the patient, if desired.    2. Status post cataract extraction OU. Stable.     3. 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  No evidence of diabetic  retinopathy nor clinically significant macular edema was present in either eye.  I emphasized the long-term risk to vision from diabetes and the importance of strict glycemic control, blood pressure control, along with regular eye exams to detect diabetic retinopathy at an early stage to prevent vision loss. She should return in one year for a dilated eye examination, or sooner if any changes are noticed.    Return to clinic in one year for a general eye examination or sooner if any changes are noticed.    Patient WAS wearing a mask during the exam.  Droplet precautions were taken for the patient.  PPE used by the provider: surgical mask    Amanda Pea [place], O.D, F.A.A.O.  Energy manager of Ophthalmology

## 2019-12-26 NOTE — Nursing Note (Signed)
Patient was wearing a surgical mask.  Droplet precautions were followed when caring for the patient.   PPE used by me during encounter: Surgical mask and eye protection     Otilio Jefferson, [facility]    Lab Results   Lab Name Value Date/Time    HGBA1C 6.7 (H) 07/22/2019 09:48 AM    HGBA1C 6.3 (H) 04/20/2019 10:29 AM    HGBA1C 6.7 (H) 12/23/2018 10:10 AM    HGBA1C 6.6 (H) 08/30/2015 08:35 AM    HGBA1C 7.7 (H) 03/23/2015 09:04 AM    HGBA1C 6.9 (H) 12/06/2014 11:45 AM

## 2019-12-30 ENCOUNTER — Telehealth: Payer: Self-pay | Admitting: "Endocrinology

## 2019-12-30 NOTE — Telephone Encounter (Signed)
Paperwork from [employer] for diabetes testing supplies (glucose test strips) was received and placed in Dr.[name]'s box for MD to complete.    Thank You    Tenna Delaine, MA

## 2020-01-11 NOTE — Telephone Assessment (Signed)
Paperwork from [facility] for diabetic detailed written order (glucose test strips) was completed by MD and  Faxed to [phone] on 01-11-20.    Thank You     Tenna Delaine MA

## 2020-01-31 ENCOUNTER — Ambulatory Visit: Payer: Medicare Other | Admitting: Dermatology

## 2020-01-31 ENCOUNTER — Encounter: Payer: Self-pay | Admitting: Dermatology

## 2020-01-31 DIAGNOSIS — R208 Other disturbances of skin sensation: Secondary | ICD-10-CM

## 2020-01-31 DIAGNOSIS — L820 Inflamed seborrheic keratosis: Secondary | ICD-10-CM

## 2020-01-31 DIAGNOSIS — L918 Other hypertrophic disorders of the skin: Secondary | ICD-10-CM

## 2020-01-31 DIAGNOSIS — L821 Other seborrheic keratosis: Secondary | ICD-10-CM

## 2020-01-31 DIAGNOSIS — L578 Other skin changes due to chronic exposure to nonionizing radiation: Secondary | ICD-10-CM

## 2020-01-31 NOTE — Nursing Note (Signed)
Patient was screened for allergies, tobacco usage and pain level, but no vitals taken per clinician's request.         PPE Used During the Visit:  Eye and Face Protection: Body Protection: Hand Protection:   [x]Glasses/Goggles                                    [x]Face Shield  [x]Surgical Mask                          []N-95 Mask                           []Particulate Respirator  []Half/Full Face Elastomeric Respirator  []PAPR                  []Gown []Gloves       Omar Sanchez, MA

## 2020-01-31 NOTE — Progress Notes (Signed)
Follow-up Visit 01/31/2020    Reason for Visit: Lesion on left abdomen     History of Present Illness:     Paula Daugherty is an 51yrfemale who presents today for full body skin examination with primary concern for lesion on the left abdomen. Endorses a lesion on the left abdomen, present for unknown duration of time. Rough to texture, but otherwise asymptomatic, and no prior treatments per patient.     Additionally, patient reports a lesion on the left cheek, present for many months. Irritative and bothersome to patient, rubs on glasses. No prior treatment.       Past Medical History:  1.  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  Negative for melanoma     Family History   Problem Relation Name Age of Onset    Diabetes Father      Heart Mother          MI at 675s    Non-contributory Brother      Non-contributory Brother         Social History:  1.  Tobacco use:  None     2.  Occupation:  Retired       Medications and Allergies: reviewed by MD today      Review of Systems: The patient denies fever, fatigue, weight loss, cough, headache, night sweats, or chills.       Physical Examination:    General:  Appears comfortable, well-developed, well-nourished, and pleasant with normal affect.  Neuro:  Alert and oriented to person, time, and place    Cutaneous:      Exam of the scalp, ears, face, eyelids/eyes, lips, neck, chest, axillae, abdomen, back, buttocks/groin, right and left upper extremities, right and left lower extremities, and digits & nails were notable for the  following:     The pertinent findings are as listed below:    Left medial cheek: Tan/pink keratotic papule with underlying erythema    Trunk / Extremities / Left lower abdomen: brown to tan keratotic papules and plaques    Upper chest/anterior neck: smooth, skin-colored, pedunculated papules     Sun-exposed areas: mottled dyspigmentation, telangiectasias, and elastosis      Assessment / Plan:     1. Irritated Seborrheic Keratosis----Left medial cheek    - Recommended treatment with liquid nitrogen as this is bothersome to patient; pt agreeable  - Counseled regarding risks including scar formation, dyspigmentation, potential for recurrence   - A total of 1 lesion was treated with liquid nitrogen; procedure was tolerated well; after-care reviewed    2. Seborrheic Keratoses----Trunk / Extremities / Left lower abdomen    - Benign nature of the lesion discussed and reassurance given. No treatment indicated at this time.    3. Acrochordons----Upper chest / Anterior neck     - Benign  nature of the lesion discussed and reassurance given. No treatment indicated at this time.    4. Dermatoheliosis     - Sun protection reviewed including use of sunscreen SPF 30 or greater, use of wide-brimmed hat, and sun avoidance       - Follow up in 1 year      Patient WAS wearing a surgical mask  Contact and Droplet precautions were followed when caring for the patient.   PPE used by provider during encounter: Surgical mask and Face Shield/Goggles      SCRIBE DISCLAIMER:  I, Shandel Busic, SCRIBE, am personally taking down the notes in the presence of Dr. Lynden Ang, MD.  Electronically signed by - Burt Knack, SCRIBE  01/31/2020  11:07 AM      PROVIDER DISCLAIMER:  Cyndia Bent, MD, personally performed the services described in this documentation, as scribed by the trained medical scribe above in my presence, and it is both accurate and complete.    Lynden Ang, MD  [facility]

## 2020-02-08 ENCOUNTER — Other Ambulatory Visit: Payer: Self-pay | Admitting: "Endocrinology

## 2020-02-08 DIAGNOSIS — E1122 Type 2 diabetes mellitus with diabetic chronic kidney disease: Secondary | ICD-10-CM

## 2020-02-08 DIAGNOSIS — N183 Chronic kidney disease, stage 3 unspecified: Secondary | ICD-10-CM

## 2020-02-23 ENCOUNTER — Ambulatory Visit: Payer: Medicare Other | Attending: "Endocrinology

## 2020-02-23 DIAGNOSIS — E1129 Type 2 diabetes mellitus with other diabetic kidney complication: Secondary | ICD-10-CM | POA: Insufficient documentation

## 2020-02-23 DIAGNOSIS — Z794 Long term (current) use of insulin: Secondary | ICD-10-CM | POA: Insufficient documentation

## 2020-02-23 LAB — COMPREHENSIVE METABOLIC PANEL
Alanine Transferase (ALT): 37 U/L (ref 5–54)
Albumin: 3.6 g/dL (ref 3.2–4.6)
Alkaline Phosphatase (ALP): 119 U/L — ABNORMAL HIGH (ref 35–115)
Aspartate Transaminase (AST): 45 U/L — ABNORMAL HIGH (ref 15–43)
Bilirubin Total: 0.8 mg/dL (ref 0.3–1.3)
Calcium: 9.4 mg/dL (ref 8.6–10.5)
Carbon Dioxide Total: 26 mmol/L (ref 24–32)
Chloride: 104 mmol/L (ref 95–110)
Creatinine Serum: 1.06 mg/dL (ref 0.44–1.27)
E-GFR Creatinine (Female): 53 mL/min/{1.73_m2}
Glucose: 111 mg/dL — ABNORMAL HIGH (ref 70–99)
Potassium: 4.4 mmol/L (ref 3.3–5.0)
Protein: 7.7 g/dL (ref 6.3–8.3)
Sodium: 142 mmol/L (ref 135–145)
Urea Nitrogen, Blood (BUN): 17 mg/dL (ref 8–22)

## 2020-02-23 LAB — LIPID PANEL
Cholesterol: 136 mg/dL (ref 0–200)
HDL Cholesterol: 52 mg/dL (ref >=35)
LDL Cholesterol Calculation: 54 mg/dL (ref <130)
Non-HDL Cholesterol: 84 mg/dL (ref <=150)
Total Cholesterol: HDL Ratio: 2.6 (ref <4.0)
Triglyceride Level: 148 mg/dL (ref 35–160)

## 2020-02-25 LAB — HEMOGLOBIN A1C
Hgb A1C,Glucose Est Avg: 146 mg/dL
Hgb A1C: 6.7 % — ABNORMAL HIGH (ref 3.9–5.6)

## 2020-02-28 ENCOUNTER — Ambulatory Visit: Payer: Medicare Other | Admitting: "Endocrinology

## 2020-02-28 DIAGNOSIS — E119 Type 2 diabetes mellitus without complications: Secondary | ICD-10-CM

## 2020-02-28 DIAGNOSIS — E278 Other specified disorders of adrenal gland: Secondary | ICD-10-CM

## 2020-02-28 DIAGNOSIS — I10 Essential (primary) hypertension: Secondary | ICD-10-CM

## 2020-02-28 MED ORDER — PEN NEEDLE, DIABETIC 31 GAUGE X 5/16"
11 refills | Status: DC
Start: 2020-02-28 — End: 2021-01-29

## 2020-02-28 MED ORDER — ATORVASTATIN 10 MG TABLET: 10.0000 mg | ORAL_TABLET | Freq: Every day | ORAL | 3 refills | Status: DC

## 2020-02-28 NOTE — Progress Notes (Signed)
 Telephone Visit Note     I performed this encounter via telephone and no video was used.     Endocrinology Follow up clinic note:    Chief Complaint:  I'm here to follow up on my diabetes.    HPI: Paula Daugherty is a 74yr old female who has a diagnosis of type 2 diabetes mellitus who presents to Endocrinology for follow up. Her history is as follows:  She was initially diagnosed with diabetes around 2000 on routine labs. She has a strong family history of diabetes so she wasn't surprised at this. She was started on Metformin  around 2005 and then she was started on insulin  in 2014. She was up to 64 units of Lantus  at night. However, after making changes to her diet and lifestyle, she was taken off insulin  in 2015. Basal insulin  was added back to her regimen in 2018.      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    Interim History: She returns today for follow up. Her last visit with Endocrinology was on 11/01/2019. Since this visit, she states that overall, things have been about the same. She continues to have constant nausea for which she is taking zofran  frequently. She has follow up with Dr. Dillon and an upcoming endoscopy in December 2021.  She also continues to deal with fatigue that has not changed significantly recently.   Regarding her blood glucose, this has ranged from 103 to 140. She will very rarely have an increase in this value that is in direct correlation to a higher carbohydrate food and she has not had any hypoglycemia since her last clinic visit.     Current Regimen:              Lantus  34 units QHS              Metformin  1000 mg BID              Jardiance  25 mg daily  Insulin  injection technique: Holds the needle in for a few seconds with each injection  Injection sites: Abdomen  Rotates sites: Yes  Discards insulin  30 days after opening: Yes  Hypoglycemia: None since last clinic visit              Awareness: Yes, when blood glucose is in the  80s  Hyperglycemia: Almost never sees numbers >180    Checks blood glucose: 2x/day  Last eye exam: 12/2019, no retinopathy, f/u 1 year    She does have some itching in her legs (R>L) but denies any rash, cuts, scrapes, numbness or tingling in her feet or legs.       ROS:  All other systems were reviewed and are negatative except for pertinent positive and negative responses as documented in HPI.     Medications:  Medication reconciliation was performed today.   Outpatient Medications Marked as Taking for the 02/28/20 encounter (Telemedicine Scheduled) with Arman Lonell Rosaline Brown, MD   Medication Sig Dispense Refill    Amlodipine  (NORVASC) 10 mg Tablet Take 1 tablet by mouth every day. 90 tablet 3    Atorvastatin  (LIPITOR) 10 mg Tablet Take 1 tablet by mouth every day at bedtime. 90 tablet 3    Empagliflozin  (JARDIANCE ) 25 mg Tablet Take 1 tablet by mouth every day. 90 tablet 3    Insulin  Glargine (LANTUS  SOLOSTAR U-100 INSULIN ) 100 unit/mL (3 mL) Pen Inject 40 units subcutaneously every night at bedtime. Please provide 3  month supply. 45 mL 3    Insulin  Pen Needles, Disposable, (BD ULTRA-FINE III SHORT PEN NEEDLE) 31 gauge x 5/16 Use for injecting insulin  1 times per day 100 each 11    Losartan  (COZAAR ) 25 mg Tablet Take 1 tablet by mouth every day. 90 tablet 1    Metformin  (GLUCOPHAGE ) 1,000 mg tablet Take 1 tablet by mouth 2 times daily with meals. (diabetes) 180 tablet 3    Omeprazole  (PRILOSEC) 20 mg Delayed Release Capsule TAKE 1 CAPSULE BY MOUTH TWICE DAILY BEFORE MEALS 180 capsule 1    Ondansetron  (ZOFRAN ) 4 mg Tablet Take 1 tablet by mouth every 8 hours if needed. 50 tablet 1    Ondansetron  (ZOFRAN -ODT) 4 mg disintegrating tablet Take 1 tablet by mouth every 8 hours if needed. 50 tablet 1    ONETOUCH ULTRA BLUE TEST STRIP Strips Use to test blood glucose twice daily. (E11.9) 200 strip 3     I did review patient's past medical and family/social history, no changes noted.   PMH:  Past medical  history was reviewed from problem list.   Patient Active Problem List   Diagnosis    DM2 (diabetes mellitus, type 2) (HCC)    Depression with anxiety    Stress at home    Leg cramps-right calf    Right ear pain    Fibromyalgia    HTN (hypertension)    GERD (gastroesophageal reflux disease)    Hyperlipidemia with target LDL less than 70    Vitamin D  insufficiency    Abnormal LFTs    Renal cyst ( 6.4 cm cyst left kidney)    Cough    Fatty liver    Macroalbuminuric diabetic nephropathy (HCC)    History of actinic keratoses    Cataract    Ptosis of eyelid    Liver fibrosis    Adrenal nodule (HCC)    Medication Therapy Auth    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HCC)    Mixed conductive and sensorineural hearing loss of both ears    Neoplasm of uncertain behavior of skin    Abdominal pain, generalized    Nausea without vomiting    Asymptomatic microscopic hematuria    Subcutaneous nodule    Angina pectoris (HCC)    Mild episode of recurrent major depressive disorder (HCC)    NASH (nonalcoholic steatohepatitis)     VITAL SIGNS:  LMP 05/06/1983   There is no height or weight on file to calculate BMI.    PHYSICAL EXAM:  Physical exam was unable to be done for this telephone encounter but patient was able to speak in full sentences and answer questions appropriately.     LAB TESTS/STUDIES:   I personally reviewed the following laboratory and imaging studies.     02/23/2020 10:27   Sodium 142   Potassium 4.4   Chloride 104   Carbon Dioxide Total 26   Urea Nitrogen, Blood (BUN) 17   Creatinine Blood 1.06   Glucose 111 (H)   Calcium  9.4   Protein 7.7   Albumin 3.6   Alkaline Phosphatase (ALP) 119 (H)   Aspartate Transaminase (AST) 45 (H)   Bilirubin Total 0.8   Alanine Transferase (ALT) 37   E-GFR (Female) 53   Cholesterol 136   Triglyceride 148   LDL Cholesterol Calculation 54   HDL Cholesterol 52   Non-HDL Cholesterol 84   Total Cholesterol:HDL Ratio 2.6   Hgb A1C 6.7 (H)   Hgb  A1C,Glucose Est Avg 146  06/01/2019 11:36 10/25/2019 09:06   White Blood Cell Count 7.8    Hemoglobin 12.2    Hematocrit 37.4 37.7   MCV 77.2 (L)    RDW 17.2 (H)    Platelet Count 184       11/02/2019 12:00   Creatinine Spot Urine 150.47   Microalbumin Urine 16.9   Microalbumin/Creatinine Ratio 112 (H)     01/27/2019:    Ref Range & Units     TOTAL VOLUME mL 1220    TIME OF COLLECTION hr 24    CORTISOL,UR FREE PER VOLUME ug/L 7.70    CORTISOL,UR FREE PER 24HR <=45.0 ug/d 9.4    CORTISOL,UR FREE RATIO TO CRT ug/g CRT 9.39         Ref Range & Units     TOTAL VOLUME mL 1220    TIME OF COLLECTION hr 24    METANEPHRINE,UR-PER VOLUME ug/L 96    METANEPHRINE,UR-PER 24HR 36 - 229 ug/d 117    METANEPHRINE,UR-RATIO TO CRT 0 - 300 ug/g CRT 113    NORMETANEPHRINE,UR-PER VOLUME ug/L 383    NORMETANEPHRINE,UR-PER 24HR 95 - 650 ug/d 467    NORMETANEPHRIN,UR-RATIO TO CRT 0 - 400 ug/g CRT 451High     METANEPHRINES INTERPRETATION   See Note       CT Abdomen and Pelvis (04/08/2017):  FINDINGS:  URINARY COLLECTING SYSTEM:  Renal stones: None.  Ureteral stones: None.  Bladder stones: None.  Solid / enhancing renal mass: None. Left interpolar cyst with thin  peripheral calcification. Additional subcentimeter hypodensities, too small  to characterize but statistically likely benign.  Renal enhancement: Normal symmetric enhancement of the kidneys.  There is segmental nonopacification of the bilateral distal ureters.  Hydronephrosis: None.  Hydroureter: None.  Renal collecting system: No intraluminal filling defects.  Ureters: No intraluminal filling defects in the opacified segments of the  ureters. Segments of the ureter that are not opacified do not show any wall  thickening, suspicion of an enhancing mass, or focal or segmental  dilatation.  Urinary bladder: No focal or asymmetric bladder wall thickening, focal  filling defects, or enhancing polypoid lesions.  REMAINING ABDOMEN AND PELVIS:  Lower Chest: Heavy coronary artery  calcification. Bibasilar atelectasis.  Liver: Unchanged hepatic steatosis and hepatomegaly.  Bile Ducts: Unremarkable.  Gallbladder: Unremarkable.  Pancreas: Unremarkable.  Spleen: Unremarkable.  Adrenal Glands: Unchanged 1.8 cm left adenoma.  GI Tract: Diverticulosis without evidence of acute diverticulitis.  Peritoneal Cavity: No free fluid or free air.  Uterus and Ovaries: Status post hysterectomy. No adnexal masses.  Lymph Nodes: Unchanged 1.3 cm porta hepatic lymph node. Unchanged  additional smaller upper abdominal lymph nodes.  Major Vascular Structures: Moderate aortoiliac artery calcification.  Soft Tissues: Unchanged partially calcified left gluteal soft tissue  densities, likely injection granulomas.  Musculoskeletal: No suspicious bony lesions or acute fractures. Mild  multilevel degenerative changes of the visualized spine.     IMPRESSION:  1. No cause for hematuria. No urolithiasis, renal mass, or  uroepithelial mass in the kidneys, ureters, or urinary bladder.  2. No hydronephrosis or hydroureter.  3. Unchanged 1.8 cm left adrenal adenoma.    Impression: This is a 74yr old female with Diabetes Mellitus Type II who presents to Endocrinology for follow up. Overall, her glycemic control is at goal as evidenced by her POC A1c today of 6.9%.  Review of her blood glucose meter download today demonstrates overall well controlled blood glucose with minimal hyperglycemia and no hypoglycemia. Because of this, I have not  recommended any changes to her current medication doses.    She was encouraged to report any hypoglycemia or persistent hyperglycemia prior to follow up.   For her difficulties with insulin  injections, she will send me her current insulin  type and we will try a slightly smaller needle to see if this improves her symptoms.   I have also ordered a urine culture test now for the complaints of increased urinary frequency.      She also has a history of an adrenal nodule. Hormonal evaluation was  normal in 01/2019 and she will be due for repeat hormonal evaluation in 01/2020.     DIABETES HISTORY  (-) h/o retinopathy.      (-) h/o microalbuminuria/nephropathy.  (-) h/o neuropathy.  (-) h/o Autonomic dysfunction  (-) h/o Gastroparesis  (-) h/o CAD  (-) h/o PVD     Last eye exam: 11/2018, f/u scheduled for 12/2019  Last urine microalbumin: 04/2019, 431  Pneumovax: 12/2014, Prevnar 2018  Influenza vaccine: w  (-) ASA  (+) Statin  (+) ACE/ARB          Recommendations:  (E11.29) Type 2 diabetes mellitus with other diabetic kidney complication  (primary encounter diagnosis)  - Continue current dose of metformin , Jardiance , and Lantus   - Encouraged dietary changes and increased exercise  - Encouraged patient to continue blood glucose monitoring to at least 1x/day, goal to check 2x/day  - Last LDL at goal, continue statin, repeat due 02/2021  - Last microalbumin:creatinine ratio elevated, continue cozaar , repeat due 10/2020  - Up to date on screening eye exam, follow up as scheduled in 12/2020  - Repeat A1c prior to follow up appointment  - Influenza vaccine now (patient will have done in the next week), up to date on COVID-19 vaccination  - Discussed daily foot care  - Blood pressure well controlled at last clinic visit (129/68) in 12/2019, continue ARB    2. Adrenal nodule  - Biochemical evaluation normal in 01/2019, stability by imaging in 04/2017  - Repeat hormonal evaluation due now (~1 year from previous) for 5 years total (2022)     Follow up in 3 months    If you have any questions, please do not hesitate to contact me at [phone].  Thank you for allowing me to participate in the care of this patient.    EDUCATION:  I educated/instructed the patient or caregiver regarding all aspects of the above stated plan of care.  The patient or caregiver indicated understanding.      [place] interpreter was not used.    Report electronically signed by:  Lonell Lamer, M.D.  Clinical Associate Professor  Department of  Endocrinology    I spent a total of 18 minutes today on this patient's visit.

## 2020-03-02 ENCOUNTER — Ambulatory Visit: Payer: Medicare Other

## 2020-03-19 ENCOUNTER — Other Ambulatory Visit: Payer: Self-pay | Admitting: GASTROENTEROLOGY

## 2020-03-21 ENCOUNTER — Ambulatory Visit: Payer: Medicare Other | Admitting: Family Medicine

## 2020-03-27 ENCOUNTER — Ambulatory Visit: Payer: Medicare Other | Attending: "Endocrinology

## 2020-03-27 ENCOUNTER — Telehealth: Payer: Self-pay | Admitting: GASTROENTEROLOGY

## 2020-03-27 DIAGNOSIS — E278 Other specified disorders of adrenal gland: Secondary | ICD-10-CM | POA: Insufficient documentation

## 2020-03-27 NOTE — Telephone Encounter (Addendum)
Pre procedure call: Left message on patient's voicemail regarding upcoming GI appointment. Instructed to read instructions 1 week prior and secure ride home. Informed pt that she is not required at this time  to have a covid test prior to procedure since she fully vaccinated, therefore appointment for covid test on 04/04/2020 has been cancelled.  If any cough, fever above 100F (37.8), shortness of breath,  H/A, vomiting, diarrhea, abdominal pain or rash, cough, fever, shortness of breath, body aches, sore throat, chills, loss of taste or smell call [facility] until 4:30pm M-F. If after hours or weekends call Hospital operator at [phone] and ask for GI on call Physician. Patient is scheduled for EGD on 04/06/2020 at 0900.      Burman Freestone, LVN

## 2020-03-27 NOTE — Telephone Encounter (Signed)
Received call from pt, spoke with pt after 3X dentifications verified , Pt confirmed appointment for [place] on 04/06/2020 at 0900. Aware must have a driver secure for procedure. Reviewed prep instructions with pt and sent via mail & Mychart. All questions answered and verbalized understanding  about the procedure no further questions asked. Informed pt that she is not required at this time to have covid test prior to procedure since she's been fully vaccinated. Therefore, appointment for covid test has been cancelled for 04/04/2020. Travel questionnaire completed. Denied having any  flu like symptoms. Encouraged pt to contact [facility] if develop any flu like symptoms or if pt has further questions, concerns, or need to cancel appointment advise to call back [phone] opt # 3 Monday to Friday 8am to 4 pm. After hours to call [phone] and ask for the GI Fellow on-call.    Burman Freestone, LVN

## 2020-03-28 LAB — CREATININE 24 HOUR URINE
COLLECTION INTERVAL, URINE: 24 h
Creatinine 24 Hr: 908 mg/(24.h) (ref 800–2000)
Creatinine Conc: 72.61 mg/dL
Urine Volume: 1250 mL

## 2020-03-30 LAB — CATECHOLAMINES,URINE FREE SENDOUT
CREATININE,UR PER 24HR: 875 mg/d (ref 500–1400)
CREATININE,UR PER VOLUME: 70 mg/dL
DOPAMINE,UR PER 24HR: 59 ug/d — ABNORMAL LOW (ref 71–485)
DOPAMINE,UR PER VOLUME: 47 ug/L
DOPAMINE,UR RATIO TO CRT: 67 ug/g CRT (ref 0–250)
EPINEPHRINE,UR PER 24 HR: <1 ug/d (ref 1–14)
EPINEPHRINE,UR PER VOLUME: <1 ug/L
EPINEPHRINE,UR RATIO TO CRT: <1 ug/g CRT (ref 0–20)
NOREPINEPHRINE,UR PER 24HR: 26 ug/d (ref 14–120)
NOREPINEPHRINE,UR PER VOLUME: 21 ug/L
NOREPINEPHRINE,UR RATIO TO CRT: 30 ug/g CRT (ref 0–45)
TIME OF COLLECTION: 24 hr
TOTAL VOLUME: 1250 mL

## 2020-03-31 LAB — METANEPHRINES FRACTIONATED,UR SENDOUT
CREATININE,UR PER 24HR: 838 mg/d (ref 500–1400)
CREATININE,UR PER VOLUME: 67 mg/dL
METANEPHRINE,UR-PER 24HR: 98 ug/d (ref 36–229)
METANEPHRINE,UR-PER VOLUME: 78 ug/L
METANEPHRINE,UR-RATIO TO CRT: 116 ug/g CRT (ref 0–300)
NORMETANEPHRIN,UR-RATIO TO CRT: 473 ug/g CRT — ABNORMAL HIGH (ref 0–400)
NORMETANEPHRINE,UR-PER 24HR: 396 ug/d (ref 95–650)
NORMETANEPHRINE,UR-PER VOLUME: 317 ug/L
TIME OF COLLECTION: 24 hr
TOTAL VOLUME: 1250 mL

## 2020-04-01 LAB — CORTISOL,URINE FREE SENDOUT
CORTISOL,UR FREE PER 24HR: 13.5 ug/d (ref <=45.0)
CORTISOL,UR FREE PER VOLUME: 10.8 ug/L
CORTISOL,UR FREE RATIO TO CRT: 15.43 ug/g CRT
CREATININE,UR PER 24HR: 875 mg/d (ref 500–1400)
CREATININE,UR PER VOLUME: 70 mg/dL
TIME OF COLLECTION: 24 hr
TOTAL VOLUME: 1250 mL

## 2020-04-03 ENCOUNTER — Encounter: Payer: Self-pay | Admitting: Internal Medicine

## 2020-04-04 ENCOUNTER — Other Ambulatory Visit: Payer: Self-pay

## 2020-04-04 HISTORY — PX: EGD: AMBSHX0053

## 2020-04-05 NOTE — Progress Notes (Signed)
GASTROENTEROLOGY PRE-PROCEDURE H&P    Referring provider: [name], MD  Procedure: EGD    Subjective:  HPI: Paula Daugherty is a 74yrold female who presents for: history of duodenal neuroendocrine tumor.    RDYJ:[id number] negative.  CV: negative.  Resp: negative.  GI: negative.     I did review all available medical, surgical, personal/social history.    Physical exam:  General Appearance: healthy, alert, no distress, pleasant affect, cooperative.  Heart:  normal rate and regular rhythm, no murmurs, clicks, or gallops.  Lungs: clear to auscultation.  Abdomen: BS normal.  Abdomen soft, non-tender.  No masses or organomegaly.    Airway Assessment:    Mallampati class 2  ROM normal      Labs/imaging:  Performed at [facility] reviewed: see computer database.  Performed outside of [place] (reviewed if applicable):     Impression/ plan:  EGD  Patient is a candidate for moderate sedation.  Patient is ASA status: 2    The procedure, risks, benefits, and alternatives were explained.  All patient questions were answered.  The informed consent was signed, and will be scanned into the computer database at a later date.    Patient barriers to learning: none    Patient/family understanding: verbalizes    EGeorgeanne Nim MD

## 2020-04-06 ENCOUNTER — Ambulatory Visit: Payer: Medicare Other | Attending: Anatomic Pathology & Clinical Pathology | Admitting: GASTROENTEROLOGY

## 2020-04-06 ENCOUNTER — Encounter: Payer: Self-pay | Admitting: GASTROENTEROLOGY

## 2020-04-06 VITALS — BP 127/74 | HR 72 | Temp 98.0°F | Resp 18 | Ht 62.0 in | Wt 167.3 lb

## 2020-04-06 DIAGNOSIS — K2970 Gastritis, unspecified, without bleeding: Secondary | ICD-10-CM

## 2020-04-06 DIAGNOSIS — K2960 Other gastritis without bleeding: Secondary | ICD-10-CM | POA: Insufficient documentation

## 2020-04-06 DIAGNOSIS — C7A8 Other malignant neuroendocrine tumors: Secondary | ICD-10-CM

## 2020-04-06 DIAGNOSIS — Z85068 Personal history of other malignant neoplasm of small intestine: Secondary | ICD-10-CM

## 2020-04-06 MED ORDER — ONDANSETRON 4 MG DISINTEGRATING TABLET: 4.0000 mg | DISINTEGRATING_TABLET | Freq: Three times a day (TID) | ORAL | 1 refills | Status: DC | PRN

## 2020-04-06 MED ORDER — LIDOCAINE HCL 2 % MUCOSAL SOLUTION
5.0000 mL | 0 refills | Status: DC | PRN
Start: 2020-04-06 — End: 2020-04-06

## 2020-04-06 MED ORDER — SODIUM CHLORIDE 0.9 % INTRAVENOUS SOLUTION
INTRAVENOUS | 0 refills | Status: AC
Start: 2020-04-06 — End: 2020-04-06

## 2020-04-06 MED ORDER — DIPHENHYDRAMINE 50 MG/ML INJECTION SOLUTION
INTRAMUSCULAR | 0 refills | Status: DC
Start: 2020-04-06 — End: 2020-04-06

## 2020-04-06 MED ORDER — FENTANYL (PF) 50 MCG/ML INJECTION SOLUTION
INTRAMUSCULAR | 0 refills | Status: DC
Start: 2020-04-06 — End: 2020-04-06

## 2020-04-06 MED ORDER — MIDAZOLAM (PF) 1 MG/ML INJECTION SOLUTION
INTRAMUSCULAR | 0 refills | Status: DC
Start: 2020-04-06 — End: 2020-04-06

## 2020-04-06 NOTE — Procedures (Signed)
Patient: Paula Daugherty  Location:   Medical record number: [id number]  Sex: female  Age: 39yrs)  Date of birth: 9April 11, 1947   Procedure: Upper Endoscopy  Sub-procedure: Biopsy     Date of Service: 04/06/2020    Sedation start: 1048  Time start: 1100  Time end: 1105    Referring Physician: PCP: JJamelle Haring MD.     PERFORMING SURGEON: EGeorgeanne Nim MD. I performed the entire procedure.    ASSISTANT SURGEON: None    GI Pre-procedure Indications:  History of duodenal neuroendocrine tumor s/p resection here for surveillance.    Details of the Procedure:    Informed consent was obtained for the procedure, including sedation. Risks of perforation, hemorrhage, adverse drug reaction, pain, infection and aspiration were discussed. The patient was placed in the left lateral decubitus position. Based on the pre-procedure assessment, including review of the patient's medical history, medications, allergies, and review of systems, she had been deemed to be an appropriate candidate for conscious sedation; she was therefore sedated with the medications listed below. The patient was monitored continuously with EKG tracing, pulse oximetry, blood pressure monitoring, and direct observations.     An oral examination was performed. The Olympus endoscope was inserted into the mouth and advanced under direct vision to the second portion of the duodenum.   A careful inspection was made as the gastroscope was withdrawn, including a retroflexed view of the proximal stomach; findings and interventions are described below. Appropriate photo documentation was obtained. The patient tolerated the procedure well, and there were no complications. She was taken to the recovery area in stable condition.     Sedation:   Sedation was administered for single procedure/procedures  Versed 5 mg IV and Fentanyl 100 mcg IV     Findings and Interventions:   Esophageal mucosa normal  Esophageal-gastric junction localized at 35 cm.  Squamo-columnar junction  regular  Hiatal hernia absent  Gastric mucosa with diffuse redness in the antrum s/p biopsy  Duodenal mucosa normal no residual tumor identified  Retroflexed view normal    Impression:   Gastritis was found and biopsied.  No residual duodenal tumor was found.    Recommendation/Plan:   Await pathology      EGeorgeanne Nim MD   04/06/2020    EGeorgeanne Nim MD  Attending Physician

## 2020-04-06 NOTE — Patient Instructions (Addendum)
Discharge Instructions for upper endoscopy    Findings:  upper endoscopy gastritis (mild) which was biopsied.  No evidence of tumor in the small intestine.    Activity:    Rest today, resume usual activitiy tomorrow     Diet:    Resume usual diet:     Medication:    Resume all usual medications.  I have refilled your Zofran ODT.    Follow up:   We will notify you of your biopsy results via phone call or letter.If you do not receive notice of your results by three weeks, please call us at the phone number provided below.      During your procedure, air was pumped into your GI tract so your doctor could see clearly to make a diagnosis and/or treat your problem.     Some possible side effects you may experience are:   - Discomfort due to a distended (bloated) abdomen which will subside after a few hours to two days.   - Nausea may be a side effect of the medication and will subside.   - The medications you received may make you dizzy and sleepy, it is important that you do not drive, operate machinery or drink alcohol for at least one day.   - Severe pain is not expected and should be reported    Other side effects may include:  Upper Endoscopy: A sore throat can be helped by cough drops, or a salt-water gargle.    If problems, call [facility] at [phone] during business hours Monday through Friday 7:30am to 4:30 pm.  After hours call [phone] and ask to speak to the GI Fellow on call.          Gastritis: Care Instructions  Your Care Instructions     Gastritis is a sore and upset stomach. It happens when something irritates the stomach lining. Many things can cause it. These include an infection such as the flu or something you ate or drank. Medicines or a sore on the lining of the stomach (ulcer) also can cause it. Your belly may bloat and ache. You may belch, vomit, and feel sick to your stomach.  You should be able to relieve the problem by taking medicine. And it may help to change your diet. If  gastritis lasts, your doctor may prescribe medicine.  Follow-up care is a key part of your treatment and safety. Be sure to make and go to all appointments, and call your doctor if you are having problems. It's also a good idea to know your test results and keep a list of the medicines you take.  How can you care for yourself at home?   If your doctor prescribed antibiotics, take them as directed. Do not stop taking them just because you feel better. You need to take the full course of antibiotics.   Be safe with medicines. If your doctor prescribed medicine to decrease stomach acid, take it as directed. Call your doctor if you think you are having a problem with your medicine.   Do not take any other medicine, including over-the-counter pain relievers, without talking to your doctor first.   If your doctor recommends over-the-counter medicine to reduce stomach acid, such as Pepcid AC (famotidine), Prilosec (omeprazole), or Tagamet HB (cimetidine) follow the directions on the label.   Drink plenty of fluids to prevent dehydration. Choose water and other clear liquids. If you have kidney, heart, or liver disease and have to limit  fluids, talk with your doctor before you increase the amount of fluids you drink.   Limit how much alcohol you drink.   Limit or avoid caffeine, which is found in coffee, tea, cola drinks, and chocolate.  When should you call for help?   Call 911 anytime you think you may need emergency care. For example, call if:    You vomit blood or what looks like coffee grounds.     You pass maroon or very bloody stools.   Call your doctor now or seek immediate medical care if:    You start breathing fast and have not produced urine in the last 8 hours.     You cannot keep fluids down.   Watch closely for changes in your health, and be sure to contact your doctor if:    You do not get better as expected.   Where can you learn more?  Go to https://www.bennett.info/  Enter Z536  in the search box to learn more about "Gastritis: Care Instructions."  Current as of: February 10, 2021Content Version: 13.0   2006-2021 [employer].   Care instructions adapted under license by your healthcare professional. If you have questions about a medical condition or this instruction, always ask your healthcare professional. [facility] any warranty or liability for your use of this information.

## 2020-04-06 NOTE — Nursing Note (Signed)
NURSING PROCEDURE NOTE      Note started: 04/06/2020, 10:40 AM    Patient WAS wearing a surgical mask  Droplet precautions were followed when caring for the patient.   PPE used by provider during encounter: Surgical mask, Face Shield/Goggles, Gown and Gloves     Procedure pause done and plan of sedation reviewed. Viscous Lidocaine 2 % given PO. Sedation administered as ordered.  Bite block placed. Patient to L side lying position. S/P EGD. Oral secretions suctioned PRN the entire procedure. Patient tolerated procedure well. Refer to MD notes and nursing flowsheet for details. To RR. Report was given to [place], Therapist, sports.    Leo Grosser, RN

## 2020-04-06 NOTE — Nursing Note (Signed)
Patient WAS wearing a surgical mask  Droplet precautions were followed when caring for the patient.   PPE used by provider during encounter: Surgical mask, Face Shield/Goggles and Gloves   Nursing Pre-Procedure Assessment    Paula Daugherty arrived by ambulating  into clinic today at 837 from home.      Patient has arranged for a ride home from husband D.J who can be contacted at [phone]. Patient agrees to having individual providing ride home present while the post procedure instructions and results are given.Instructed patient that post sedation they are not to drive, drink alcohol, sign legal documents, or operate machinery for the remainder of the day    Verified procedure with the patient scheduled today for: EGD.    No outpatient medications have been marked as taking for the 04/06/20 encounter (Office Visit) with [name], MD.     Anticoagulants: Patient denies taking Aspirin, NSAIDs, or other anticoagulants for the past 5 days.  OTC/Herbal preparations: no    Reviewed with the patient her health status in regards to allergies (including latex), medications, pregnancy, hypertension, atrial fibrilation, liver disease, bleeding disorders, diabetes,  CVA's, seizures, sleep apnea, glaucoma, cancer, prosthesis or implants, infectious diseases, surgical history; and disease of heart, lungs, liver, or kidneys. Positive history reviewed and updated in Health History section of the EMR.    Patient Active Problem List   Diagnosis    DM2 (diabetes mellitus, type 2) ([place])    Depression with anxiety    Stress at home    Leg cramps-right calf    Right ear pain    Fibromyalgia    HTN (hypertension)    GERD (gastroesophageal reflux disease)    Hyperlipidemia with target LDL less than 70    Vitamin D insufficiency    Abnormal LFTs    Renal cyst ( 6.4 cm cyst left kidney)    Cough    Fatty liver    Macroalbuminuric diabetic nephropathy (HCC)    History of actinic keratoses    Cataract    Ptosis of  eyelid    Liver fibrosis    Adrenal nodule (HCC)    Medication Therapy Auth    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HCC)    Mixed conductive and sensorineural hearing loss of both ears    Neoplasm of uncertain behavior of skin    Abdominal pain, generalized    Nausea without vomiting    Asymptomatic microscopic hematuria    Subcutaneous nodule    Angina pectoris (HCC)    Mild episode of recurrent major depressive disorder (HCC)    NASH (nonalcoholic steatohepatitis)     Allergies   Allergen Reactions    Contrast Dye [Radiopaque Agent] Hives    Hydrochlorothiazide Other-Reaction in Comments     Patient reports    Januvia [Sitagliptin] Other-Reaction in Comments     Patient reports    Lyrica [Pregabalin] Other-Reaction in Comments     Patient reports    Omnipaque [Iohexol] Other-Reaction in Comments     Patient reports    Prozac [Fluoxetine Hcl] Other-Reaction in Comments     Patient reports    Sulfa (Sulfonamide Antibiotics) Rash    Topiramate Other-Reaction in Comments     Hairfall     Past Medical History:   Diagnosis Date    Angina pectoris ([place]) 09/23/2017    Anxiety     Asthma     Cirrhosis (HCC)     Constipation, chronic     Depression  Diabetes mellitus ([place])     Fatty liver     Hypertension     Kidney disease 2015    cyst left kidney per pt    Liver fibrosis     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/20/2016    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[place]) 04/17/2016    Psychiatric illness      Past Surgical History:   Procedure Laterality Date    BIOFEEDBACK PERI/URO/RECTAL  10/21/2017    session #1: N Score = 5.4    BIOFEEDBACK PERI/URO/RECTAL  11/04/2017    session #2    BIOFEEDBACK PERI/URO/RECTAL  11/18/2017    Session #3    BIOFEEDBACK PERI/URO/RECTAL  12/02/2017    Session #4    BIOFEEDBACK PERI/URO/RECTAL  12/30/2017    Session #5    BIOFEEDBACK PERI/URO/RECTAL  03/23/2018    Session #6    CAPSULE ENDOSCOPY  01/27/2018    CAPSULE ENDOSCOPY   01/29/2018         COLONOSCOPY  01/04/14    polyp--TA and erosion; hemorrhoids; tics; repeat x 3 yrs    COLONOSCOPY  12/16/2017    TA; repeat in 3 yrs    EGD      EGD  04/08/2019    biopsy path--pending    HC BIOFEEDBACK PERI/URO/RECTAL  05/02/2018         HC COLONOSCOPY,REMV LESN,SNARE  12/16/2017    MAMMOPLASTY, REDUCTION  1985    MANOMETRY  09/02/2017    work on constipation; kegels and biofeedback; proceed w/colonoscopy     PHACOEMULSIFICATION, CATARACT Right 02/12/2015    with IOL    PR ESOPHAGOGASTRODUODENOSCOPY TRANSORAL DIAGNOSTIC  02/12/2016    EGD--polyp--path pending; antral gastritis; no varices     PR ESOPHAGOGASTRODUODENOSCOPY TRANSORAL DIAGNOSTIC  06/11/2016    EGD--biopsy at site of carcinoid tumor path pending     PR ESOPHAGOGASTRODUODENOSCOPY TRANSORAL DIAGNOSTIC      EGD--folds in duodenum--path pending     PR ESOPHAGOGASTRODUODENOSCOPY TRANSORAL DIAGNOSTIC  04/2017    EGD; repeat EGD in 1 year     PR ESOPHAGOGASTRODUODENOSCOPY TRANSORAL DIAGNOSTIC  04/20/2018    gastritis; nodule--path pending    PR KNEE SCOPE,DIAGNOSTIC  1980s    Knee arthroscopy    PR LIGATE FALLOPIAN TUBE      Tubal ligation    PR REPAIR TYMPANIC MEMBRANE      Tympanoplasty    PR TOTAL ABDOM HYSTERECTOMY  1980s    partial; no BSO    REPAIR, BLEPHAROPTOSIS Bilateral 08/06/2015    Blepharoplasty bilateral upper lids     Social History     Tobacco Use   Smoking Status Former Smoker    Packs/day: 0.10    Years: 20.00    Pack years: 2.00   Smokeless Tobacco Never Used   Tobacco Comment    quit 30 years ago     Social History     Substance and Sexual Activity   Drug Use No       Pre Procedure Checks:    Identaband on and two forms of identification information verified: yes  Pt completed bowel prep: GI Procedure  NPO since: 8pm last night  Belongings are with patient under gurney.  Patient has:  glasses,    Barriers to Learning assessed: none. Patient verbalizes understanding of teaching and instructions.    Past  anesthesia responses:  No problem.  Nursing Assessment Data Base:  Ventilation   --  Respirations: normal, unlabored.  Circulation/Perfusion -- Skin: warm, normal color and  dry.  Cognition/Communication -- Behavior: alert and oriented and cooperative.  Gastro-Intestinal: no distress.  Abdomen: soft and non-tender.  Level of Ambulation: self.    Reviewed written post procedure instructions with the patient (What To Expect After Your Procedure).   Barriers to Learning assessed: none. Patient verbalizes understanding of teaching and instructions.  Nursing assessment completed by:   Marian Sorrow, RN, RN    Date: 04/06/2020 Time: 872

## 2020-04-09 ENCOUNTER — Encounter: Payer: Self-pay | Admitting: GASTROENTEROLOGY

## 2020-04-09 LAB — SURGICAL PATHOLOGY

## 2020-04-09 NOTE — Telephone Encounter (Signed)
Duplicate request. New Rx sent 04/09/2020.   Thanks,   Jas

## 2020-04-10 ENCOUNTER — Encounter: Payer: Self-pay | Admitting: Family Medicine

## 2020-04-12 ENCOUNTER — Ambulatory Visit: Payer: Medicare Other

## 2020-04-17 ENCOUNTER — Ambulatory Visit: Payer: Medicare Other

## 2020-06-01 ENCOUNTER — Other Ambulatory Visit: Payer: Self-pay | Admitting: Family Medicine

## 2020-06-01 DIAGNOSIS — I10 Essential (primary) hypertension: Secondary | ICD-10-CM

## 2020-06-04 NOTE — Telephone Encounter (Signed)
Last seen by PCP 12/14/2019.

## 2020-06-11 ENCOUNTER — Other Ambulatory Visit: Payer: Self-pay | Admitting: Family Medicine

## 2020-06-11 DIAGNOSIS — Z1231 Encounter for screening mammogram for malignant neoplasm of breast: Secondary | ICD-10-CM

## 2020-06-18 ENCOUNTER — Telehealth: Payer: Self-pay | Admitting: "Endocrinology

## 2020-06-18 ENCOUNTER — Other Ambulatory Visit: Payer: Self-pay | Admitting: "Endocrinology

## 2020-06-18 NOTE — Telephone Encounter (Signed)
Spoke to the patient, 3 identifiers were used to identify the patient.    SUBJECTIVE/OBJECTIVE: Paula Daugherty is a 75yrold female calling about urinary pain and frequency. She denies any fever, or cloudy/ foul smelling urine, but is having some mid back pain. She reports that the symptoms has improved since yesterday.  Informed her that the message was sent to Dr. SRosary Lively but our protocol states to contact your PCP. She verbalized understanding and agreed.    IMPRESSION: urinary symptoms    PLAN:   Advice given   Protocol Used: Urinary Symptoms  Protocol-Based Disposition: See PCP or Video Visit within 24 Hours    Positive Triage Questions:  * Side (flank) or lower back pain present  * Urinating more frequently than usual (i.e., frequency)    Care Advice Discussed:  * Reasons To Call Back      - Fever occurs      - Unable to urinate and bladder feels full      - You become worse.    Negative Triage Question:  * Fever > 100.4 F (38.0 C)  Advised to call back directly if there are further questions, or if these symptoms fail to improve as anticipated or worsen. Advised to call back after any ER visit. Patient verbalizes agreement with recommendations and understanding of instructions.     PCharlett Nose RN, MSN/Ed   Internal Medicine Triage

## 2020-06-18 NOTE — Telephone Encounter (Signed)
Patient has appointment for tomorrow.

## 2020-06-18 NOTE — Telephone Encounter (Signed)
-----   Message from [name] sent at 06/17/2020  3:40 PM PST -----  Regarding: Possible UTI  Hi, I don't know if are back yet but have a question. I am having frequent urination. It feels like urine is running over a raw nerve. I was wondering if you could order a urine test?  I will be going to lab on Tuesday getting A1C for my appointment with you in two weeks.  Just thought maybe I could do all at once. Thank you and look forward to seeing you.

## 2020-06-18 NOTE — Telephone Encounter (Signed)
General Advice / Message to MD:    Per previous encounter patient is calling back in transferred to triage      Thank you    Vinie Sill  [facility]  Patient [place]

## 2020-06-18 NOTE — Telephone Encounter (Signed)
Called patient to assess.     Voicemail message was left to call back an advice nurse at [facility] at [phone].    When the patient calls back, please contact IM Triage via [facility] Bat Line.    Thank you,    Dorreen Valiente, RN, MSN/Ed  Internal Medicine Triage

## 2020-06-19 ENCOUNTER — Ambulatory Visit (INDEPENDENT_AMBULATORY_CARE_PROVIDER_SITE_OTHER): Payer: Medicare Other

## 2020-06-19 ENCOUNTER — Ambulatory Visit: Payer: Medicare Other | Attending: Family Medicine | Admitting: Family Medicine

## 2020-06-19 ENCOUNTER — Encounter: Payer: Self-pay | Admitting: Family Medicine

## 2020-06-19 VITALS — BP 137/81 | HR 61 | Temp 97.6°F | Resp 12 | Wt 162.7 lb

## 2020-06-19 DIAGNOSIS — E1129 Type 2 diabetes mellitus with other diabetic kidney complication: Secondary | ICD-10-CM

## 2020-06-19 DIAGNOSIS — R300 Dysuria: Secondary | ICD-10-CM | POA: Insufficient documentation

## 2020-06-19 DIAGNOSIS — G8929 Other chronic pain: Secondary | ICD-10-CM | POA: Insufficient documentation

## 2020-06-19 DIAGNOSIS — Z794 Long term (current) use of insulin: Secondary | ICD-10-CM

## 2020-06-19 DIAGNOSIS — M542 Cervicalgia: Secondary | ICD-10-CM

## 2020-06-19 LAB — URINALYSIS AND CULTURE IF IND
Bilirubin Urine: NEGATIVE
Glucose Urine: >=500 mg/dL — AB
Ketones: NEGATIVE mg/dL
Nitrite Urine: NEGATIVE
Protein Urine: 100 mg/dL — AB
RBC: 5 /HPF — ABNORMAL HIGH (ref 0–2)
Specific Gravity, Urine: 1.022 (ref 1.002–1.030)
Squamous EPI: 3 /HPF (ref <=10)
Trans Epi: 1 /HPF (ref 0–2)
Urobilinogen: NEGATIVE mg/dL (ref ?–2.0)
WBC, Urine: 76 /HPF — ABNORMAL HIGH (ref 0–5)
pH URINE: 5 (ref 4.8–7.8)

## 2020-06-19 LAB — POC URINALYSIS DIPSTICK AUTOMATED W/O MICROSCOPY
POC BILIRUBIN URINE: NEGATIVE
POC GLUCOSE URINE: 1 mg/dL — AB
POC KETONES: NEGATIVE
POC NITRITE URINE: NEGATIVE
POC PH URINALYSIS: 6 pH (ref 4.8–7.8)
POC PROTEIN URINE: 100 — AB
POC SP GRAVITY: 1.02 (ref 1.002–1.030)
POC UROBILINOGEN: 0.2 EU/dL

## 2020-06-19 MED ORDER — NYSTATIN 100,000 UNIT/GRAM TOPICAL POWDER: Freq: Four times a day (QID) | TOPICAL | 2 refills | Status: DC

## 2020-06-19 MED ORDER — CEPHALEXIN 500 MG CAPSULE
500.0000 mg | ORAL_CAPSULE | Freq: Two times a day (BID) | ORAL | 0 refills | Status: AC
Start: 2020-06-19 — End: 2020-06-26

## 2020-06-19 NOTE — Progress Notes (Signed)
Paula Daugherty is a 71yrfemale who presents with a chief complaint of "going more frequently than usual".    Chief Complaint   Patient presents with    Urination Painful     Painful urination, frequency, back pn         Paula Daugherty a 89yrld female with history of DM2 reports increased frequency and burning x days; FSBS at goal this AM; nothing makes her symptoms better; nothing makes her symptoms worse.    Review of Systems -   no fever or chills; working on desired weight loss   Reports neck tension and band like distribution up   Daughter is talking to her now; brother is not since mother passed   Enjoying cruising out of [place]     Past Medical History:   Diagnosis Date    Ang[place] p[place] ([place]    Anxiety     Asthma     Cirrhosis ([facility]    Constipation, chronic     Depression     Diabetes mellitus ([facility] [place]    Fatty liver     Hypertension     Kidney disease 2015    cyst left kidney per pt    Liver fibrosis     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/20/2016    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[facility]12/14/2017    Psychiatric illness      Social History     Socioeconomic History    Marital status: MARRIED    Number of children: 1   Occupational History    Occupation: PsAdministrator, 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 [employer]  Tobacco Use    Smoking status: Former Smoker     Packs/day: 0.10     Years: 20.00     Pack years: 2.00    Smokeless tobacco: Never Used    Tobacco comment: quit 30 years ago   Substance and Sexual Activity    Alcohol use: Yes     Alcohol/week: 0.0 standard drinks     Comment: rare    Drug use: No    Sexual activity: Yes     Partners: Male     Family History   Problem Relation Name Age of Onset    Diabetes Father      Heart Mother          MI at 6064s   Non-contributory Brother      Non-contributory Brother         OBJECTIVE:  BP 137/81 (SITE: right arm, Orthostatic Position: sitting, Cuff Size: regular)   Pulse 61   Temp 36.4 C (97.6 F) (Temporal)   Resp 12    Wt 73.8 kg (162 lb 11.2 oz)   LMP 05/06/1983   BMI 29.76 kg/m   ..GMarland Kitchenneral Appearance: healthy, alert, no distress, pleasant affect, cooperative.  Neck:  Supple; no LAD; muscle tension bilaterally.  Abdomen: soft; no tenderness to palpation; no CVA tenderness to palpation.  Extremities:  no cyanosis, clubbing, or edema.  Neuro: grossly appears intact.  MS: appropriate affect, behavior and mentation     ASSESSMENT:  (R30.0) Dysuria  (primary encounter diagnosis)  Comment: mild symptoms x days; positive LE   Plan: Urinalysis and Culture if Ind, Urinalysis and         Culture if Ind          (E11.29,  Z79.4) Type 2 diabetes mellitus with other diabetic kidney complication, with long-term current use of insulin ([facility]  Comment: previously at goal   Plan: A1c is due     (M54.2,  G89.29) Chronic neck pain  Comment: possible muscle tension per history; would like to try PT   Plan: Physical Therapy Referral          Number and Complexity of Problems Addressed  2 or more stable chronic illnesses    Amount and/or Complexity of Data to be Reviewed and Analyzed  1 unique test ordered    Risk of Complications and/or Morbidity or Mortality of Patient Management  Moderate        Total encounter time including history, physical examination, and coordination of care was approximately 20 minutes of which more than 50% was spent counseling regarding assessment/diagnosis and treatment plan. No guarantees were made regarding her medical care or treatment outcome. Barriers to Learning: none.  Patient verbalizes understanding of teaching and instructions.    Electronically signed by:    [name], MD  [place], [facility] Board of Family Medicine  Associate physician [facility], [place]   [phone]

## 2020-06-19 NOTE — Nursing Note (Addendum)
Pt reported at accu check was 96 fasting.  Phillis Haggis, M.A.

## 2020-06-19 NOTE — Patient Instructions (Addendum)
Please see about possibly getting Shingrix from outside pharmacy--repeat due after 2 mos with 1 booster to complete series of 2.     Counseling regarding-  urinary tract infection causes and prevention.  Empty bladder after intercourse, void frequently, wipe from front to back, avoid constipation and drink plenty of fluids, avoid oil base lubricants, use water base lubricant if necessary with intercourse. Avoid oil, salts, and bubbles in bath.  Call or return to clinic if symptoms worsen, fail to improve  or other symptoms develop.

## 2020-06-19 NOTE — Nursing Note (Signed)
Patient roomed, chief complaint noted, allergies verified, blood pressure, pulse, respiration, temperature, and weight obtained, screened for pain, and pharmacy verified.    2 Phillis Haggis, M.A.    PPE Used During the Visit:    Eye and Face Protection:          Body Protection:   [] Glasses/Goggles    [] Gown   [x] Face Shield   [x] Surgical Mask   Hand Protection:   [] N-95 Mask     [] Gloves   [] Particulate Respirator   [] Half/Full Face Elastomeric Respirator   [] PAPR    Pat ient:    [x] Surgical Mask      Urinalysis dip stick done on patient per pt's symptoms.  See labs for results.  Phillis Haggis, MA

## 2020-06-20 NOTE — Telephone Encounter (Signed)
Provider: Rosary Lively, D  Next appointment: 07/04/20   Last appointment: 02/28/20   Last prescribed date per EMR:   Medication Name test strips   Med last filled: 03/19/20  Med last reviewed/reconciled: 02/28/20   Refill authorized per P&T Protocol 06/20/2020

## 2020-06-21 LAB — CULTURE URINE, BACTI: URINE CULTURE: >100000 — AB

## 2020-06-21 LAB — HEMOGLOBIN A1C
Hgb A1C,Glucose Est Avg: 137 mg/dL
Hgb A1C: 6.4 % — ABNORMAL HIGH (ref 3.9–5.6)

## 2020-06-28 ENCOUNTER — Telehealth: Payer: Self-pay

## 2020-06-28 NOTE — Telephone Encounter (Signed)
Placed [facility] prescription in MD mailbox for signature.  Adela Glimpse, RN, MPH, BC-ADM  Diabetes Care and Education Specialist

## 2020-07-03 NOTE — Telephone Encounter (Signed)
Faxed signed [facility] to [place].  Adela Glimpse, RN, MPH, BC-ADM  Diabetes Care and Education Specialist

## 2020-07-04 ENCOUNTER — Encounter: Payer: Self-pay | Admitting: "Endocrinology

## 2020-07-04 ENCOUNTER — Ambulatory Visit: Payer: Medicare Other | Attending: "Endocrinology | Admitting: "Endocrinology

## 2020-07-04 DIAGNOSIS — E119 Type 2 diabetes mellitus without complications: Secondary | ICD-10-CM

## 2020-07-04 DIAGNOSIS — E278 Other specified disorders of adrenal gland: Secondary | ICD-10-CM

## 2020-07-04 DIAGNOSIS — Z794 Long term (current) use of insulin: Secondary | ICD-10-CM | POA: Insufficient documentation

## 2020-07-04 DIAGNOSIS — R809 Proteinuria, unspecified: Secondary | ICD-10-CM | POA: Insufficient documentation

## 2020-07-04 DIAGNOSIS — E1129 Type 2 diabetes mellitus with other diabetic kidney complication: Secondary | ICD-10-CM | POA: Insufficient documentation

## 2020-07-04 DIAGNOSIS — Z86012 Personal history of benign carcinoid tumor: Secondary | ICD-10-CM

## 2020-07-04 MED ORDER — ATORVASTATIN 10 MG TABLET: 10.0000 mg | ORAL_TABLET | Freq: Every day | ORAL | 3 refills | Status: DC

## 2020-07-04 NOTE — Nursing Note (Signed)
Completed download of glucometer and/or insulin pump. Report given to appointment provider for review.     Patient identified using  two (2) Identifiers.   Vital sign measurements collected.   Patients' allergies and pharmacy verified.   Patient screened for tobacco use.    Patient screened for pain and fall history  Patient was wearing a surgical mask covering nose and mouth.   Writer followed droplet precautions when caring for the patient by wearing surgical mask with goggles/face shield.   Provider followed droplet precautions when caring for the patient by wearing surgical mask with goggles/face    Leonie Amacher I. Nicolson-Singh, RMA(MAII)  Registered Medical Assistant  Internal Medicine Clinics and Specialty

## 2020-07-05 ENCOUNTER — Telehealth: Payer: Self-pay | Admitting: "Endocrinology

## 2020-07-05 NOTE — Telephone Encounter (Signed)
Paperwork from [employer] for diabetes testing supplies (glucose test strips) was received and placed in Dr.[name]'s box for MD to complete.    Please do not send (route) messages to me personally since I am not in the clinic on a daily basis. Please route responses to P IM TRIAGE.    Inda Coke, RN, BSN, [place]  Internal Medicine, Triage  [phone]

## 2020-07-09 ENCOUNTER — Encounter: Payer: Self-pay | Admitting: Family Medicine

## 2020-07-09 NOTE — Telephone Encounter (Signed)
From: [name]  To: [name], MD  Sent: 07/06/2020 7:22 PM PST  Subject: Covid Positive     I am so sorry to tell you that I tested positive today (Friday, March 4th) for Covid. I had not symptoms prior to today. Woke up feeling blah and like I have a cold so tested. DJ tested negative. Again I am very sorry that I may have exposed you and Kathlee Nations. Both of you please stay safe.

## 2020-07-10 NOTE — Telephone Encounter (Signed)
Completed & faxed to [facility].  Adela Glimpse, RN, MPH, BC-ADM  Diabetes Care and Education Specialist

## 2020-07-11 ENCOUNTER — Other Ambulatory Visit: Payer: Self-pay | Admitting: "Endocrinology

## 2020-07-11 ENCOUNTER — Other Ambulatory Visit: Payer: Self-pay | Admitting: Family Medicine

## 2020-07-11 DIAGNOSIS — I10 Essential (primary) hypertension: Secondary | ICD-10-CM

## 2020-07-12 ENCOUNTER — Telehealth: Payer: Self-pay

## 2020-07-12 MED ORDER — NITROFURANTOIN MONOHYDRATE/MACROCRYSTALS 100 MG CAPSULE
100.0000 mg | ORAL_CAPSULE | Freq: Two times a day (BID) | ORAL | 0 refills | Status: AC
Start: 2020-07-12 — End: 2020-07-19

## 2020-07-12 NOTE — Telephone Encounter (Signed)
Paula Daugherty is a 75yrold female  3 patient identifiers used.  Per:   patient.    Disposition: CONSULT MD: Please advise.  Multiple allergies noted and pharmacy verified    Per:   patient verbalizes agreement to plan. Agrees to callback with any increase in symptoms/concerns or questions.    See Assessment Below  ClearTriage Note:Patient is currently quarantine for positive covid test; but requesting different antibiotic to treat ongoing urinary symptoms.  Treated 2/15 for UTI with positive culture (E Coli).     patient reports she took Keflex 500 mg BID for 7 days completed the 06/26/20.  She states the Keflex irritated her stomach; since she has IBS with looser stools.  Reports she still have frequent urination and pressure to bladder and would like a different antibiotic.  States symptoms were only relieved for 3 - 4 days after Keflex.    Protocol Used: Urinary Symptoms (Adult)  Protocol-Based Disposition: See PCP or Video Visit within 24 Hours  Override (Final) Disposition: Consult MD  Override Reason: Caller refused suggested disposition  Override Notes: Request different ABX    Video visit not offered    Positive Triage Question:  * Urinating more frequently than usual (i.e., frequency)    Care Advice Discussed:  * Reasons To Call Back      - Fever occurs      - Unable to urinate and bladder feels full      - You become worse.    Negative Triage Questions:  * Shock suspected (e.g., cold/pale/clammy skin, too weak to stand, low BP, rapid pulse)  * Sounds like a life-threatening emergency to the triager  * [1] Unable to urinate (or only a few drops) > 4 hours AND [2] bladder feels very full (e.g., palpable bladder or strong urge to urinate)  * [1] Decreased urination and [2] drinking very little AND [2] dehydration suspected (e.g., dark urine, no urine > 12 hours, very dry mouth, very lightheaded)  * Patient sounds very sick or weak to the triager  * Fever > 100.4 F (38.0 C)  * Side (flank) or lower back pain  present  * [1] Can't control passage of urine (i.e., urinary incontinence) AND [2] new-onset (< 2 weeks) or worsening      MEnid Skeens RN  PCN Triage

## 2020-07-12 NOTE — Telephone Encounter (Signed)
Last seen by PCP 06/19/2020.

## 2020-07-12 NOTE — Telephone Encounter (Signed)
Medication name: AMLODIPINE  Meets Epic Protocol: Yes, as determined by Epic (See Protocol Details for specific information)    Refill authorized per P&T Protocol 07/12/2020

## 2020-07-12 NOTE — Telephone Encounter (Signed)
Macrobid therapy sent.  Try to increase fluids.  Please call or make an appointment with any questions or concerns or worsening symptoms by calling [phone].

## 2020-07-12 NOTE — Telephone Encounter (Signed)
Paula Daugherty is a 75yrold female  3 patient identifiers used.  Per:   patient.    Disposition: Advice given per clear triage protocol  Per:   patient verbalizes agreement to plan. Agrees to callback with any increase in symptoms/concerns or questions.    See Assessment Below  ClearTriage Note:  Patient reports she started having some covid symptoms 3/4 Friday  and she did home covid test which was positive.  Fatigue, cough, Temp 99.1, nasal congestion.   She is fully vaccinated and booster.  Reports her symptoms had resolved and she feels better.   Reports she is staying hydrated.  She repeated her home covid test today and she is still positive on home antigen test, but no active symptoms. Denies SOB, cough, or fever.  She does not feel she needs a video visit at this time.  requesting a different round of antibiotic for recurrent urinary symptoms (I will open Telephone Encounter and send message to the doctor)    Protocol Used: COVID-19 - Diagnosed or Suspected (Adult)  Protocol-Based Disposition: Home Care    Positive Triage Question:  * [1] [id number]-35diagnosed by positive lab test (e.g., PCR, rapid self-test kit) AND [2] mild symptoms (e.g., cough, fever, others) AND [[3]no complications or SOB    Care Advice Discussed:  * Reassurance and Education - Positive COVID-19 Lab Test and Mild Symptoms      - A positive result on a PCR or rapid self-test kit is highly accurate for diagnosing COVID-19. It is highly likely that you have COVID-19.  * General Care Advice for COVID-19 Symptoms      - The symptoms are generally treated the same whether you have COVID-19, influenza or some other respiratory virus.      - Cough: Use cough drops.      - Feeling dehydrated: Drink extra liquids. If the air in your home is dry, use a humidifier.      - Fever: For fever over 101 F (38.3 C), take acetaminophen every 4 to 6 hours (Adults 650 mg) OR ibuprofen every 6 to 8 hours (Adults 400 mg). Before taking any medicine, read all the  instructions on the package. Do not take aspirin unless your doctor has prescribed it for you.      - Muscle aches, headache, and other pains: Often this comes and goes with the fever. Take acetaminophen every 4 to 6 hours (Adults 650 mg) OR ibuprofen every 6 to 8 hours (Adults 400 mg). Before taking any medicine, read all the instructions on the package.      - Sore throat: Try throat lozenges, hard candy or warm chicken broth.  * Cough Medicines      - Cough Drops: Over-the-counter cough drops can help a lot, especially for mild coughs. They soothe an irritated throat and remove the tickle sensation in the back of the throat. Cough drops are easy to carry with you.      - Cough Syrup with Dextromethorphan: An over-the-counter cough syrup can help your cough. The most common cough suppressant in over-the-counter cough medicines is dextromethorphan.      - Home Remedy - Hard Candy: Hard candy works just as well as over-the-counter cough drops. People who have diabetes should use sugar-free candy.      - Home Remedy - Honey: This old home remedy has been shown to help decrease coughing at night. The adult dosage is 2 teaspoons (10 ml) at bedtime.  * Cough Syrup With  Dextromethorphan - Extra Notes and Warnings      - Do not try to completely stop coughs that produce mucus and phlegm.      - Coughing is helpful. It brings up the mucus from the lungs and helps prevent pneumonia.  * Humidifier      - If the air is dry, use a humidifier in the bedroom.      - Dry air makes coughs worse.  * COVID-19 - How to Protect Others - When You Are Sick With COVID-19      - Stay Home a Minimum of 5 Days: Home isolation is needed for at least 5 days after the symptoms started. Stay home from school or work if you are sick. Do Not go to religious services, child care centers, shopping, or other public places. Do Not use public transportation (e.g., bus, taxis, ride-sharing). Do Not allow any visitors to your home. Leave the house only  if you need to seek urgent medical care.      - Wear a Mask for 10 Days: Wear a well-fitted mask for 10 full days any time you are around others inside your home or in public. Do not go to places where you are unable to wear a mask.      - Wash Hands Often: Wash hands often with soap and water. After coughing or sneezing are important times. If soap and water are not available, use an alcohol-based hand sanitizer with at least 60% alcohol, covering all surfaces of your hands and rubbing them together until they feel dry. Avoid touching your eyes, nose, and mouth with unwashed hands.  * FAQ - When Can I Stop Home Isolation If I Am Sick With COVID-19?      - You can stop home isolation after 5 days if:      - ... Fever is gone for at least 24 hours off fever-reducing medicines AND      - ... Any cough and other symptoms are improving      - Wear a well-fitted mask for 10 full days any time you are around others inside your home or in public. Do not go to places where you are unable to wear a mask.      - Notes: People who were severely ill with COVID-19 should stay home (isolate) for at least 10 days. Consult your doctor (or NP/PA) before ending isolation.  * Reasons To Call Back      - Fever over 103 F (39.4 C)      - Fever lasts over 3 days      - Fever returns after being gone for 24 hours      - Chest pain or difficulty breathing occurs      - You become worse      Enid Skeens, RN  PCN Triage

## 2020-07-12 NOTE — Telephone Encounter (Signed)
3 patient identifiers used  Spoke with Patient and read MD message. Verbalizes understanding and no further questions. Patient agrees to call back, if additional questions or concerns arise.      Paula Dick, RN  PCN Triage

## 2020-07-14 ENCOUNTER — Other Ambulatory Visit: Payer: Self-pay | Admitting: Family Medicine

## 2020-07-16 NOTE — Telephone Encounter (Signed)
Last seen by  primary care physician 06/19/2020. Appointment with primary care physician scheduled for Visit date not found.

## 2020-07-24 ENCOUNTER — Other Ambulatory Visit: Payer: Self-pay | Admitting: GASTROENTEROLOGY

## 2020-07-24 DIAGNOSIS — R197 Diarrhea, unspecified: Secondary | ICD-10-CM

## 2020-07-24 NOTE — Progress Notes (Signed)
Endocrinology Follow up clinic note:    Chief Complaint:  "I'm here to follow up on my diabetes."    HPI: Paula Daugherty is a 75yrold female who has a diagnosis of type 2 diabetes mellitus who presents to Endocrinology for follow up. Her history is as follows:  She was initially diagnosed with diabetes around 2000 on routine labs. She has a strong family history of diabetes so she wasn't surprised at this. She was started on Metformin around 2005 and then she was started on insulin in 2014. She was up to 64 units of Lantus at night. However, after making changes to her diet and lifestyle, she was taken off insulin in 2015. Basal insulin was added back to her regimen in 2018.      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     Interim History: She returns today for follow up. Her last visit with Endocrinology was on 02/28/2020 via video visit. Since this visit, she states that overall, she has been doing ok.She has been working on  Her foot intake and with this, has lost some weight and her blood glucose is doing well overall. In fact, this will sometimes go low in the afternoons.     Current Regimen:              Lantus 28 units QHS              Metformin 1000 mg BID              Jardiance 25 mg daily  Insulin injection technique: Holds the needle in for a few seconds with each injection  Injection sites: Abdomen  Rotates sites: Yes  Discards insulin 30 days after opening: Yes  Hypoglycemia: Yes, blood glucose will sometimes drop low in the afternoons              Awareness: Yes, when blood glucose is in the 80s  Hyperglycemia: Almost never sees numbers >180     Meter brought today: Yes  Please see glucometer download scanned under the media tab  Checks blood glucose: 2x/day  Last eye exam: 12/2019  Foot exam: Will do today             ROS:  All other systems were reviewed and are negatative except for pertinent positive and negative responses as documented in HPI.     Medications:   Medication reconciliation was performed today.   Outpatient Medications Marked as Taking for the 07/04/20 encounter (Office Visit) with SCorky Mull MD   Medication Sig Dispense Refill    Atorvastatin (LIPITOR) 10 mg Tablet Take 1 tablet by mouth every day at bedtime. 90 tablet 3    Empagliflozin (JARDIANCE) 25 mg Tablet Take 1 tablet by mouth every day. 90 tablet 3    Insulin Glargine (LANTUS SOLOSTAR U-100 INSULIN) 100 unit/mL (3 mL) Pen Inject 40 units subcutaneously every night at bedtime. Please provide 3 month supply. 45 mL 3    Insulin Pen Needles, Disposable, (BD ULTRA-FINE III SHORT PEN NEEDLE) 31 gauge x 5/16" Use for injecting insulin 1 times per day 100 each 11    Losartan (COZAAR) 25 mg Tablet Take 1 tablet by mouth every day. 90 tablet 1    Metformin (GLUCOPHAGE) 1,000 mg tablet Take 1 tablet by mouth 2 times daily with meals. (diabetes) 180 tablet 3    Nystatin (MYCOSTATIN) 100,000 unit/gram Powder Apply to the affected area 4 times  daily. 30 g 2    Ondansetron (ZOFRAN-ODT) 4 mg disintegrating tablet Take 1 tablet by mouth every 8 hours if needed. 50 tablet 1    ONETOUCH ULTRA TEST Strips USE TO TEST BLOOD GLUCOSE TWICE DAILY. 200 strip 3    [DISCONTINUED] Amlodipine (NORVASC) 10 mg Tablet Take 1 tablet by mouth every day. 90 tablet 3    [DISCONTINUED] Omeprazole (PRILOSEC) 20 mg Delayed Release Capsule TAKE 1 CAPSULE BY MOUTH TWICE DAILY BEFORE MEALS 180 capsule 1     I did review patient's past medical and family/social history, no changes noted.   PMH:  Past medical history was reviewed from problem list.   Patient Active Problem List   Diagnosis    DM2 (diabetes mellitus, type 2) ([place])    Depression with anxiety    Stress at home    Leg cramps-right calf    Right ear pain    Fibromyalgia    HTN (hypertension)    GERD (gastroesophageal reflux disease)    Hyperlipidemia with target LDL less than 70    Vitamin D insufficiency    Abnormal LFTs    Renal cyst ( 6.4 cm  cyst left kidney)    Cough    Fatty liver    Macroalbuminuric diabetic nephropathy (HCC)    History of actinic keratoses    Cataract    Ptosis of eyelid    Liver fibrosis    Adrenal nodule (HCC)    Medication Therapy Auth    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HCC)    Mixed conductive and sensorineural hearing loss of both ears    Neoplasm of uncertain behavior of skin    Abdominal pain, generalized    Nausea without vomiting    Asymptomatic microscopic hematuria    Subcutaneous nodule    Angina pectoris (HCC)    Mild episode of recurrent major depressive disorder (HCC)    NASH (nonalcoholic steatohepatitis)     VITAL SIGNS:  BP 133/89 (SITE: left arm, Orthostatic Position: sitting, Cuff Size: regular)   Pulse 60   Temp 36.3 C (97.4 F) (Temporal)   Ht 1.575 m (5' 2" )   Wt 73.2 kg (161 lb 6 oz)   LMP 05/06/1983   BMI 29.52 kg/m   Body mass index is 29.52 kg/m.    PHYSICAL EXAM:  General Appearance: healthy, alert, no distress, pleasant affect, cooperative.  Eyes:  conjunctivae and corneas clear. PERRL, EOM's intact. sclerae normal.  Neck:  Neck supple. No adenopathy, thyroid symmetric, normal size.  Heart:  normal rate and regular rhythm, no murmurs, clicks, or gallops.  Lungs: clear to auscultation.  Abdomen: BS normal.  Abdomen soft, non-tender.  No masses or organomegaly.  Extremities:  no cyanosis, clubbing, or edema.  Skin:  Skin color, texture, turgor normal. No rashes or lesions.  Neuro: Gait normal. Reflexes normal and symmetric. Sensation and strength grossly normal.  Mental Status: Appearance/Cooperation: in no apparent distress and well developed and well nourished  Eye Contact: normal  Speech: normal volume, rate, and pitch    LAB TESTS/STUDIES:   I personally reviewed the following laboratory and/or imaging results.     10/25/2019 09:06 02/23/2020 10:27 06/19/2020 10:30   Sodium  142    Potassium  4.4    Chloride  104    Carbon Dioxide Total  26    Urea Nitrogen,  Blood (BUN)  17    Creatinine Blood  1.06    Glucose  111 (H)    Calcium  9.4    Protein  7.7    Albumin  3.6    Alkaline Phosphatase (ALP)  119 (H)    Aspartate Transaminase (AST)  45 (H)    Bilirubin Total  0.8    Alanine Transferase (ALT)  37    E-GFR (Female)  53    Cholesterol  136    Triglyceride  148    LDL Cholesterol Calculation  54    HDL Cholesterol  52    Non-HDL Cholesterol  84    Total Cholesterol:HDL Ratio  2.6    Iron Total 59     Transferrin 279     Total Iron Binding Capacity 388     Iron Percent Saturation 15.2     Ferritin 10     Thyroid Stimulating Hormone 1.91     Hgb A1C  6.7 (H) 6.4 (H)   Hgb A1C,Glucose Est Avg  146 137      06/01/2019 11:36   White Blood Cell Count 7.8   Hemoglobin 12.2   Hematocrit 37.4   MCV 77.2 (L)   RDW 17.2 (H)   Platelet Count 184      11/02/2019 12:00   Creatinine Spot Urine 150.47   Microalbumin Urine 16.9   Microalbumin/Creatinine Ratio 112 (H)     03/27/2020:   0 Result Notes    Component Ref Range & Units    TOTAL VOLUME mL 1250    TIME OF COLLECTION hr 24    DOPAMINE,UR PER VOLUME ug/L 47    DOPAMINE,UR PER 24HR 71 - 485 ug/d 59Low    DOPAMINE,UR RATIO TO CRT 0 - 250 ug/g CRT 67    NOREPINEPHRINE,UR PER VOLUME ug/L 21    NOREPINEPHRINE,UR PER 24HR 14 - 120 ug/d 26    NOREPINEPHRINE,UR RATIO TO CRT 0 - 45 ug/g CRT 30    EPINEPHRINE,UR PER VOLUME ug/L <1    EPINEPHRINE,UR PER 24 HR 1 - 14 ug/d <1    EPINEPHRINE,UR RATIO TO CRT 0 - 20 ug/g CRT <1    CATECHOLAMINES INTERPRETATION  See Note    CREATININE,UR PER VOLUME mg/dL 70    CREATININE,UR PER 24HR 500 - 1400 mg/d 875    Comment: Performed by [employer],           Component Ref Range & Units    TOTAL VOLUME mL 1250    TIME OF COLLECTION hr 24    CORTISOL,UR FREE PER VOLUME ug/L 10.80    CORTISOL,UR FREE PER 24HR <=45.0 ug/d 13.5    CORTISOL,UR FREE RATIO TO CRT ug/g CRT 15.43    CORTISOL,URINE INTERPRETATION  See Note    CREATININE,UR PER VOLUME mg/dL 70    CREATININE,UR PER 24HR 500 - 1400 mg/d 875           0 Result Notes    Component Ref Range & Units    Urine Volume mL 1,250    START DATE  03/26/2020    START TIME  8:07 AM    STOP DATE  03/27/2020    STOP TIME  8:07 AM    COLLECTION INTERVAL, URINE H 24.00    Comment: Collection Duration: 1440 minutes   Collection Duration: 24 hours    Creatinine Conc mg/dL 72.61    Creatinine 24 Hr 800-2,000 mg/24Hr 908           Component Ref Range & Units    TOTAL VOLUME mL 1250    TIME OF COLLECTION hr 24  METANEPHRINE,UR-PER VOLUME ug/L 78    METANEPHRINE,UR-PER 24HR 36 - 229 ug/d 98    METANEPHRINE,UR-RATIO TO CRT 0 - 300 ug/g CRT 116    NORMETANEPHRINE,UR-PER VOLUME ug/L 317    NORMETANEPHRINE,UR-PER 24HR 95 - 650 ug/d 396    NORMETANEPHRIN,UR-RATIO TO CRT 0 - 400 ug/g CRT 473High    METANEPHRINES INTERPRETATION  See Note    CREATININE,UR PER VOLUME mg/dL 67    CREATININE,UR PER 24HR 500 - 1400 mg/d 838         CT Abdomen and Pelvis (04/08/2017):  FINDINGS:  URINARY COLLECTING SYSTEM:  Renal stones: None.  Ureteral stones: None.  Bladder stones: None.  Solid / enhancing renal mass: None. Left interpolar cyst with thin  peripheral calcification. Additional subcentimeter hypodensities, too small  to characterize but statistically likely benign.  Renal enhancement: Normal symmetric enhancement of the kidneys.  There is segmental nonopacification of the bilateral distal ureters.  Hydronephrosis: None.  Hydroureter: None.  Renal collecting system: No intraluminal filling defects.  Ureters: No intraluminal filling defects in the opacified segments of the  ureters. Segments of the ureter that are not opacified do not show any wall  thickening, suspicion of an enhancing mass, or focal or segmental  dilatation.  Urinary bladder: No focal or asymmetric bladder wall thickening, focal  filling defects, or enhancing polypoid lesions.  REMAINING ABDOMEN AND PELVIS:  Lower Chest: Heavy coronary artery calcification. Bibasilar atelectasis.  Liver: Unchanged hepatic steatosis and  hepatomegaly.  Bile Ducts: Unremarkable.  Gallbladder: Unremarkable.  Pancreas: Unremarkable.  Spleen: Unremarkable.  Adrenal Glands: Unchanged 1.8 cm left adenoma.  GI Tract: Diverticulosis without evidence of acute diverticulitis.  Peritoneal Cavity: No free fluid or free air.  Uterus and Ovaries: Status post hysterectomy. No adnexal masses.  Lymph Nodes: Unchanged 1.3 cm porta hepatic lymph node. Unchanged  additional smaller upper abdominal lymph nodes.  Major Vascular Structures: Moderate aortoiliac artery calcification.  Soft Tissues: Unchanged partially calcified left gluteal soft tissue  densities, likely injection granulomas.  Musculoskeletal: No suspicious bony lesions or acute fractures. Mild  multilevel degenerative changes of the visualized spine.     IMPRESSION:  1. No cause for hematuria. No urolithiasis, renal mass, or  uroepithelial mass in the kidneys, ureters, or urinary bladder.  2. No hydronephrosis or hydroureter.  3. Unchanged 1.8 cm left adrenal adenoma.    Impression: This is a 75yrold female with Diabetes Mellitus Type II who presents to Endocrinology for follow up. Overall, her glycemic control is at goal as evidenced by her most recent A1c  of 6.94%.  Review of her blood glucose meter download today demonstrates overall well controlled blood glucose with minimal hyperglycemia and some increase in hypoglycemia at times in the afternoon. We discussed adjustment of her insulin dose as needed for low blood glucose readings in the afternoon.    She was encouraged to report any hypoglycemia or persistent hyperglycemia prior to follow up.        She also has a history of an adrenal nodule. Hormonal evaluation was normal in11/2021.     DIABETES HISTORY  (-) h/o retinopathy.      (-) h/o microalbuminuria/nephropathy.  (-) h/o neuropathy.  (-) h/o Autonomic dysfunction  (-) h/o Gastroparesis  (-) h/o CAD  (-) h/o PVD     Last eye exam:  12/2019  Last urine microalbumin: 10/2019, 112  Pneumovax:  12/2014, Prevnar 2018  Influenza vaccine: fall 2021  (-) ASA  (+) Statin  (+) ACE/ARB  Recommendations:  (E11.29) Type 2 diabetes mellitus with other diabetic kidney complication  (primary encounter diagnosis)  - Continue current dose of metformin, Jardiance, and Lantus  - Encouraged dietary changes and increased exercise  - Encouraged patient to continue close blood glucose monitoring   - Continue current lifestyle and dietary changes  - Last LDL at goal, continue statin, repeat due 02/2021  - Last microalbumin:creatinine ratio elevated, continue cozaar, repeat due 10/2020  - Up to date on screening eye exam, follow up as scheduled in 12/2020  - Repeat A1c prior to follow up appointment  - Influenza vaccine and COVID-19 vaccinations up to date  - Discussed daily foot care  - Continue ARB     2. Adrenal nodule  - Biochemical evaluation normal in 01/2019, stability by imaging in 04/2017  - Repeat hormonal evaluation due in fall 2022 (~1 year from previous) for 5 years total (2022)     Follow up in 3 months    If you have any questions, please do not hesitate to contact me at [phone].  Thank you for allowing me to participate in the care of this patient.    EDUCATION:  I educated/instructed the patient or caregiver regarding all aspects of the above stated plan of care.  The patient or caregiver indicated understanding.      [place] interpreter was not used.    Report electronically signed by:  Sunday Spillers, M.D.  Clinical Associate Professor  Department of Endocrinology    Patient WAS wearing a surgical mask  Droplet precautions were followed when caring for the patient.   PPE used by provider during encounter: Surgical mask

## 2020-07-30 ENCOUNTER — Ambulatory Visit: Payer: Medicare Other

## 2020-07-31 ENCOUNTER — Ambulatory Visit: Payer: Medicare Other | Attending: GASTROENTEROLOGY

## 2020-07-31 DIAGNOSIS — R197 Diarrhea, unspecified: Secondary | ICD-10-CM | POA: Insufficient documentation

## 2020-07-31 LAB — GI PANEL (BIOFIRE)
Adenovirus F 40/41: NOT DETECTED
Astrovirus: NOT DETECTED
Campylobacter: NOT DETECTED
Cryptosporidium: NOT DETECTED
Cyclospora cayetanensis: NOT DETECTED
Entamoeba histolytica: NOT DETECTED
Giardia lamblia: NOT DETECTED
Norovirus GI/GII: NOT DETECTED
Plesiomonas shigelloides: NOT DETECTED
Rotavirus A: NOT DETECTED
Salmonella: NOT DETECTED
Sapovirus: NOT DETECTED
Shiga-like toxin-producing E. coli (STEC) stx1/stx2: NOT DETECTED
Shigella/Enteroinvasive E. coli (EIEC): NOT DETECTED
Traveler's Diarrhea: NOT DETECTED
Vibrio cholerae: NOT DETECTED
Vibrio: NOT DETECTED
Yersinia enterocolitica: NOT DETECTED

## 2020-07-31 LAB — C DIFFICILE DIAGNOSTIC TEST: C DIFFICILE TOXIN: NEGATIVE

## 2020-08-02 ENCOUNTER — Ambulatory Visit (HOSPITAL_BASED_OUTPATIENT_CLINIC_OR_DEPARTMENT_OTHER): Payer: Medicare Other | Admitting: Otolaryngology

## 2020-08-02 ENCOUNTER — Encounter: Payer: Self-pay | Admitting: Family Medicine

## 2020-08-02 ENCOUNTER — Telehealth: Payer: Self-pay | Admitting: Family Medicine

## 2020-08-02 ENCOUNTER — Ambulatory Visit: Payer: Medicare Other | Attending: Audiologist | Admitting: Audiologist

## 2020-08-02 VITALS — BP 138/86 | HR 63 | Temp 97.9°F | Resp 18 | Ht 62.0 in | Wt 159.4 lb

## 2020-08-02 DIAGNOSIS — H90A32 Mixed conductive and sensorineural hearing loss, unilateral, left ear with restricted hearing on the contralateral side: Secondary | ICD-10-CM

## 2020-08-02 DIAGNOSIS — H7292 Unspecified perforation of tympanic membrane, left ear: Secondary | ICD-10-CM | POA: Insufficient documentation

## 2020-08-02 DIAGNOSIS — H8093 Unspecified otosclerosis, bilateral: Secondary | ICD-10-CM

## 2020-08-02 DIAGNOSIS — H6121 Impacted cerumen, right ear: Secondary | ICD-10-CM | POA: Insufficient documentation

## 2020-08-02 DIAGNOSIS — Z8669 Personal history of other diseases of the nervous system and sense organs: Secondary | ICD-10-CM | POA: Insufficient documentation

## 2020-08-02 DIAGNOSIS — H90A21 Sensorineural hearing loss, unilateral, right ear, with restricted hearing on the contralateral side: Secondary | ICD-10-CM

## 2020-08-02 NOTE — Nursing Note (Signed)
Patient identification verified x2  Paula Daugherty 1945-12-16 ). Patient roomed, appropriate vitals obtained, pain levels and barriers assessed, and medications reconciled.     Bubba Vanbenschoten, LVN

## 2020-08-02 NOTE — Telephone Encounter (Signed)
From: [name]  To: [name], MD  Sent: 08/02/2020 3:58 PM PDT  Subject: GI Issues    Hello, I had contacted Dr Virgel Bouquet regarding my stomach issues. Many episodes of diarrhea that is very dark and filled with mucus and sometimes pain. I didn't know if from the antibiotics or my other stomach issues. He ordered stool tests, which I have completed. His office called today to schedule an appointment with him. I couldn't do May 4th and asked for something after the 18th. They had nothing and said they would have to get back to me. I was then told to contact you and asked what you wanted me to do. I don't know why they told me to do that but I follow directions. Please let me know your thoughts and how you would like me to proceed. At this time I have no idea when I will hear anything back from them. Thank you

## 2020-08-02 NOTE — Procedures (Signed)
Audiologic Evaluation  [name], a 75yrold female, was seen today for a periodic audiologic evaluation. The patient has a history of bilateral otosclerosis, stapedectomies, and chronic left tympanic membrane perforation. The patient reported since her last evaluation in October 2019 she feels her hearing loss has remained unchanged.  She further reported experiencing dizziness upon standing or with quick turn, occasional right sided otalgia and bilateral tinnitus. [name] not wear hearing aids and is not interested in wearing them at this time.     Otoscopy revealed a clear external auditory canal in the left ear and non occluding cerumen in the right ear.     Audiogram:  Pure tone air and bone conduction testing revealed a mild sloping to profound sensorineural hearing loss from [phone] Hz in the right ear. Testing of the left ear revealed a moderately severe rising to mild mixed hearing loss from [phone] sloping to moderate to severe from 1500-8000 Hz. There were air-bone gaps of 30-60 dB HL from 250-500 Hz and of 25 dB HL at 4000 Hz in the left ear. There was significant asymmetry of 10-35 dB HL from [phone] Hz with a poorer left ear and of 15 dB HL at 6000 Hz with a poorer right ear.     Speech reception thresholds were in agreement with pure tones bilaterally. Word recognition scores were excellent (92% at 70 dB HL) in the right ear and (88% at 90 dB HL) in the left ear.     Since the previous hearing evaluation on 02/25/2018, hearing sensitivity has remained stable, bilaterally.    Impedance:  Tympanometry Type A tympanogram, consistent with normal tympanic membrane mobility and middle ear pressure in the right ear.  Tympanometry of the left ear revealed a Type B tympanogram with high ear canal volume, which suggests likely a tympanic membrane perforation.    Recommendations/Counseling:  1) Counseled regarding today's results.  2) Follow up with ENT as scheduled.  3) Repeat audiogram per ENT, or  sooner if changes occur.    See scanned audiogram in EMR.     Report Electronically Signed By:  KRhae HammockAu.D., CCC-A  Senior Audiologist  ENT-Otolaryngology  [phone]

## 2020-08-02 NOTE — Telephone Encounter (Signed)
Miscellaneous Form:    Results PT called to follow up on plan of cared faxed over yesterday please fax form to [phone] once complete    Thank you,    Sonia Side Deal  [facility]

## 2020-08-02 NOTE — Telephone Encounter (Signed)
Left voice message on identified voice mail to return call to clinic [phone] and ask to speak with an advice nurse. MyChart message sent.    Deveron Furlong, RN  PCN Nurse Triage

## 2020-08-02 NOTE — Patient Instructions (Signed)
[facility] / DISPENSING AUDIOLOGISTS  (not affiliated with [facility])    [address] [place], [place]  [address]  [facility]  [address], [place], [place]  [address]  [phone]    [place]  [address], [address]  [place], [place]  [address]  [place]  [address], [place]  [place], [place]  [address]  [phone]    [facility]  [address], [place]  [place], [place]  [address]  [phone]    [facility]  [address]  [place], [place]  [address]  [phone]    Alwyn Pea, HAD  [facility]  [address] [place], [place]  [address]  [phone]  [email or web address]  [email or web address]  [address] [place], [place]  [address]  [phone]    [facility]  [address] [place], [place]  [address]  [facility] [facility]  [address], [place]  [place]  [address]  [phone]

## 2020-08-02 NOTE — Progress Notes (Signed)
Patient: Paula Daugherty  Location: Otolaryngology Clinic   Medical Record Number: [id number] Sex: female Age: 69yr Date of Service: 08/02/2020  Date of Birth: [birth year]    OTOLARYNGOLOGY - HEAD AND NECK SURGERY  INITIAL CONSULTATION  NOTE    Dear Dr. JDaiva Huge NIzora Gala MD:    Thank you very much for referring your patient, NCatharine Kettlewell to our clinic.     CHIEF COMPLAINT:  Hearing loss    HISTORY OF PRESENT ILLNESS:  NLakara Weilandis a 89yrld female with history of bilateral otosclerosis and bilateral stapedectomies with House wire prosthesis (left in 1970s, [facility] known left chronic ear perforation (hx of unsuccessful Paper patch placed in Lt TM 2009). She was previously evaluated by Dr. DiFerrel Logann 2015 for right otalgia. Otalgia is improved with improved TMJ symptoms. She presents today with worsening word discrimination in the left ear. Otherwise, she has no symptoms. She denies drainage from either ear or vertigo. She does have intermittent high pitched tinnitus in both ears.     Since her last visit:  She feels like left hearing and speech discrimination continues to decline. She has trialed hearing aids in the past, most recently 5-6 years ago. No d/d/i/v    Past medical history, past surgical history, drug allergies, medications, and pertinent social and family medical history were reviewed with the patient, and details of these were provided by the patient in the ENT Clinic new patient questionnaire. This was reviewed and verified with the patient today.     Review of Systems is performed, including general, cardiovascular, respiratory, gastrointestinal, genitourinary, musculoskeletal, endocrinologic, dermatologic, hematologic, immunologic, neurologic, and psychiatric. Details of these were provided by the patient in the ENT Clinic new patient questionnaire. Please refer to this questionnaire for full details. This was reviewed and verified with the patient today. The pertinent positives and  negatives are listed in the history of present illness.       PHYSICAL EXAMINATION:  General: The patient appears well-developed, well-nourished, is in no distress, pleasant and cooperative. Please refer to the EMR for vital signs.   Psychiatric: Mood and affect are stable. Oriented to person, place, time and condition.   Dermatologic: Skin warm and dry.  Head: Normocephalic, grossly normal appearance. No obvious external lesions.  Face: Facial nerve function intact and symmetric bilaterally.   Eyes: Vision grossly intact; extraocular movements intact and symmetric bilaterally, no conjunctival lesions. Gaze is conjugate.   Nose: No external deformities; midline septum, nasal cavities clear without lesions or obstruction bilaterally.  Oral cavity: Clear, normal lips, normal mucosa, no lesions.   Neck: Supple, no masses, normal cervical musculoskeletal exam.  Thyroid: Normal, no masses, no thyromegaly.   Neurologic: Gait and station normal. Moves all extremities equally. 5/5 strength in all extremities.   Respiratory: Breathing comfortably without overuse of accessory muscles of respiration. No shortness of breath or tachypnea. No audible wheezing or stridor.  Cranial nerves: Cranial nerves II through XII intact and symmetric bilaterally, except for VIII    PROCEDURE:  Bilateral ear examination under binocular otomicroscopy:  Right ear:  Auricle normal. EAC with cerumen, removed with curette. Tympanic membrane clear. Wire loop visible under posterior/superior quadrant. Mobile on positive/negative pneumatic otoscopy.   Left ear:  Auricle normal. EAC clear. Posterior marginal perforation 5% with clean edges and no skin ingrowth. Stable from 2015 exam. Wire loop prosthesis seen in posterior/superior quadrant.     Binocular otomicroscopy was indicated to better visualize and evaluate the  ears bilaterally given the patient's history and physical exam findings.  See binocular otomicroscopy examination findings  below.    AUDIOGRAM:  I personally reviewed audiometry from 08/02/20 and find the following results:        I personally reviewed audiometry from 02/25/18 and find the following results:  Pure tone audiometry:   AD -mild to severe SNHL   AS -moderate to severe MHL with 30 dB ABG at 250 Hz and 20 ABG at 4000 Hz  Speech reception threshold:   AD: 30  AS: 55  Speech discrimination:   AD: 96%  AS: 80% (decreased from 94% 2015)  Tympanometry: AD - Type A; AS - Type B, large volume  Acoustic Reflexes: not tested        09/12/13             IMPRESSION:  1. H/o otosclerosis s/p B/L stapedectomies with wire house prosthesis 30+ years ago with continued good closure of air bone gaps  2. Worsening Left Sided word discrimination, likely due to otosclerotic involvement of cochlea   3. Left posterior marginal 5% perforation with no sign of cholesteatoma ingrowth             PLAN:  1. Probable diagnoses discussed with the patient.   2. Possible treatment options discussed with the patient.   3.We discussed the minimal ABG and that performing revision stapedectomy would be risky and we would as likely worsen her hearing as improve it. Therefore, we would not recommend it at this time. She indicates understanding and is in agreement with this suggestion.   4. We also discussed monitoring of the perforation. She denies infections or otorrhea and exam revealed stable perforation. Follow-up in 3 years. If she experiences drainage or other signs of infection, or if her hearing worsens markedly, she will contact me for evaluation and further discussion of options.   5. Follow up with audiology for a hearing aid adjustment           The patient was seen and evaluated with Dr. Ferrel Logan. We formulated the plan together.     --------------------------------    Lahoma Rocker. Simona Huh, MD  Department of [place] Neck Surgery, PGY-5  [facility]

## 2020-08-02 NOTE — Telephone Encounter (Signed)
Called pt to schedule f/up appt with Dr Virgel Bouquet. No answer LVM to call back.  Thank you,    Burke Keels, MA II

## 2020-08-03 ENCOUNTER — Ambulatory Visit (INDEPENDENT_AMBULATORY_CARE_PROVIDER_SITE_OTHER): Payer: Medicare Other

## 2020-08-03 ENCOUNTER — Ambulatory Visit: Payer: Medicare Other | Attending: Internal Medicine | Admitting: Internal Medicine

## 2020-08-03 VITALS — BP 136/67 | HR 71 | Temp 97.7°F | Ht 62.0 in | Wt 160.0 lb

## 2020-08-03 DIAGNOSIS — K529 Noninfective gastroenteritis and colitis, unspecified: Secondary | ICD-10-CM | POA: Insufficient documentation

## 2020-08-03 LAB — COMPREHENSIVE METABOLIC PANEL
Alanine Transferase (ALT): 33 U/L (ref <=33)
Albumin: 4.3 g/dL (ref 4.0–4.9)
Alkaline Phosphatase (ALP): 154 U/L — ABNORMAL HIGH (ref 35–129)
Aspartate Transaminase (AST): 36 U/L (ref <=41)
Bilirubin Total: 0.9 mg/dL (ref <=1.2)
Calcium: 9.5 mg/dL (ref 8.6–10.0)
Carbon Dioxide Total: 25 mmol/L (ref 22–29)
Chloride: 104 mmol/L (ref 98–107)
Creatinine Serum: 1.01 mg/dL (ref 0.51–1.17)
E-GFR Creatinine (Female): 56 mL/min/{1.73_m2}
Glucose: 141 mg/dL — ABNORMAL HIGH (ref 74–109)
Potassium: 4.3 mmol/L (ref 3.4–5.1)
Protein: 7.9 g/dL (ref 6.6–8.7)
Sodium: 143 mmol/L (ref 136–145)
Urea Nitrogen, Blood (BUN): 18 mg/dL (ref 6–20)

## 2020-08-03 LAB — CBC WITH DIFFERENTIAL
Basophils % Auto: 0.6 %
Basophils Abs Auto: 0 10*3/uL (ref 0.0–0.2)
Eosinophils % Auto: 2.6 %
Eosinophils Abs Auto: 0.2 10*3/uL (ref 0.0–0.5)
Hematocrit: 38.9 % (ref 36.0–46.0)
Hemoglobin: 12.4 g/dL (ref 12.0–16.0)
Lymphocytes % Auto: 30.8 %
Lymphocytes Abs Auto: 2 10*3/uL (ref 1.0–4.8)
MCH: 25.7 pg — ABNORMAL LOW (ref 27.0–33.0)
MCHC: 31.9 % — ABNORMAL LOW (ref 32.0–36.0)
MCV: 80.6 fL (ref 80.0–100.0)
MPV: 8.2 fL (ref 6.8–10.0)
Monocytes % Auto: 8.1 %
Monocytes Abs Auto: 0.5 10*3/uL (ref 0.1–0.8)
Neutrophils % Auto: 57.9 %
Neutrophils Abs Auto: 3.7 10*3/uL (ref 1.8–7.7)
Platelet Count: 158 10*3/uL (ref 130–400)
RDW: 18.6 % — ABNORMAL HIGH (ref 0.0–14.7)
Red Blood Cell Count: 4.83 10*6/uL (ref 4.00–5.20)
White Blood Cell Count: 6.3 10*3/uL (ref 4.5–11.0)

## 2020-08-03 LAB — C-REACTIVE PROTEIN: C-REACTIVE PROTEIN: 0.2 mg/dL (ref <0.5)

## 2020-08-03 LAB — SED RATE WESTERGREN: Sed Rate Westergren: 20 mm/hr (ref 0–30)

## 2020-08-03 NOTE — Telephone Encounter (Signed)
Second attempt made to contact pt to schedule f/up with Dr Virgel Bouquet. No answer LVM to call back, letter sent via mychart.    Thank you,    Burke Keels, MA II

## 2020-08-03 NOTE — Progress Notes (Signed)
Patient WAS wearing a surgical mask  Contact precautions were followed when caring for the patient.   PPE used by provider during encounter: Surgical mask and Face Shield/Goggles     Chief Complaint   Patient presents with    Consultation       SUBJECTIVE:  Paula Daugherty is a 75yrold female    Patient Active Problem List   Diagnosis    DM2 (diabetes mellitus, type 2) ([place]    Depression with anxiety    Stress at home    Leg cramps-right calf    Right ear pain    Fibromyalgia    HTN (hypertension)    GERD (gastroesophageal reflux disease)    Hyperlipidemia with target LDL less than 70    Vitamin D insufficiency    Abnormal LFTs    Renal cyst ( 6.4 cm cyst left kidney)    Cough    Fatty liver    Macroalbuminuric diabetic nephropathy (HCC)    History of actinic keratoses    Cataract    Ptosis of eyelid    Liver fibrosis    Adrenal nodule (HCC)    Medication Therapy Auth    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HCC)    Mixed conductive and sensorineural hearing loss of both ears    Neoplasm of uncertain behavior of skin    Abdominal pain, generalized    Nausea without vomiting    Asymptomatic microscopic hematuria    Subcutaneous nodule    Angina pectoris (HCC)    Mild episode of recurrent major depressive disorder (HCC)    NASH (nonalcoholic steatohepatitis)       She has history of neuroendocrine carcinoma of Duodenum and follows up with GI.  Recently started having diarrhea, her BM was rather on the constipation side before.   She had COVID in March. Symptoms started before COVID. She had UTI afterwards and she was treated with Keflex followed by Nitrofurantoin. Diarrhea got worse while on antibiotic.   She is getting dark almost black stool. Mostly liquid filled with mucous. She has bowel urgency and incontinence. The stool sits at the bottom of the toilet bowl. She is getting multiple BM day and night, not always associated with meals. Occasional cramps, no other abdominal  pain.   No recent change in medications or diet.   She is trying to stay hydrated. She is taking Pro biotics. She is taking Imodium and not helping.   She has lost some weight. Poor appetite. She has some nausea.   Her last colonoscopy 2019  Impression:    Benign appearing colon polyps, removed as above.  Colonic diverticulosis as described above  Internal hemorrhoids as described above.  External hemorrhoids as described above.    Recommendation/Plan:    Repeat colonoscopy in 3 years.  Follow up with primary care physician, as needed.  High fiber diet and daily metamucil  GI clinic follow up    ARoss Marcus MD  Attending Physician    ROS:  Constitutional: no fever, chills, headache.  EYES, EAR, NOSE, THROAT, MOUTH:No blurring of vision, no diplopia, no change in vision, no hearing problem, no tinnitus, no ear pain, no nasal congestion, no runny nose, no sore throat.   Respiratory: no shortness of breath, no cough, no chest pain.  Cardiac: no chest pain, no exertional dyspnea, no orthopnea, no paroxysmal nocturnal dyspnea, no edema.  GI:no abdominal pain, no nausea, vomiting, has diarrhea, no constipation. No melena.  GEN/Urinary: no dysuria, frequency, no hematuria.  Musculoskeletal: no joint pain, no muscle pain, no stiffness.  Neurological: No history of stroke, seizure. No recent neurologic symptoms.      Past Medical History:   Diagnosis Date    Angina pectoris ([place]) 09/23/2017    Anxiety     Asthma     Cirrhosis (HCC)     Constipation, chronic     Depression     Diabetes mellitus ([place])     Fatty liver     Hypertension     Kidney disease 2015    cyst left kidney per pt    Liver fibrosis     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/20/2016    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[place]) 04/17/2016    Psychiatric illness      Family History   Problem Relation Name Age of Onset    Diabetes Father      Heart Mother          MI at 27s     Non-contributory Brother      Non-contributory  Brother       Current Outpatient Medications on File Prior to Visit   Medication Sig Dispense Refill    Amlodipine (NORVASC) 10 mg Tablet Take 1 tablet by mouth every day. 90 tablet 3    Atorvastatin (LIPITOR) 10 mg Tablet Take 1 tablet by mouth every day at bedtime. 90 tablet 3    Empagliflozin (JARDIANCE) 25 mg Tablet Take 1 tablet by mouth every day. 90 tablet 3    Insulin Glargine (LANTUS SOLOSTAR U-100 INSULIN) 100 unit/mL (3 mL) Pen Inject 40 units subcutaneously every night at bedtime. Please provide 3 month supply. 45 mL 3    Insulin Pen Needles, Disposable, (BD ULTRA-FINE III SHORT PEN NEEDLE) 31 gauge x 5/16" Use for injecting insulin 1 times per day 100 each 11    Losartan (COZAAR) 25 mg Tablet Take 1 tablet by mouth every day. 90 tablet 1    Metformin (GLUCOPHAGE) 1,000 mg tablet Take 1 tablet by mouth 2 times daily with meals. (diabetes) 180 tablet 3    Nystatin (MYCOSTATIN) 100,000 unit/gram Powder Apply to the affected area 4 times daily. 30 g 2    Omeprazole (PRILOSEC) 20 mg Delayed Release Capsule TAKE 1 CAPSULE BY MOUTH TWICE DAILY BEFORE MEALS 180 capsule 0    Ondansetron (ZOFRAN) 4 mg Tablet Take 1 tablet by mouth every 8 hours if needed. 50 tablet 1    Ondansetron (ZOFRAN-ODT) 4 mg disintegrating tablet Take 1 tablet by mouth every 8 hours if needed. 50 tablet 1    ONETOUCH ULTRA TEST Strips USE TO TEST BLOOD GLUCOSE TWICE DAILY. 200 strip 3     No current facility-administered medications on file prior to visit.       OBJECTIVE:  BP 136/67 (SITE: left arm, Orthostatic Position: sitting, Cuff Size: large)   Pulse 71   Temp 36.5 C (97.7 F) (Temporal)   Ht 1.575 m (5' 2" )   Wt 72.6 kg (160 lb)   LMP 05/06/1983   SpO2 97%   BMI 29.26 kg/m   General: Not in acute distress.  HEENT: no pallor, no icterus.   Neck: supple, no Jugular venous distension.  Lungs: Clear to auscultate bilateral.  Heart: S1 S2 regular.  Abdomen: Soft, epigastric and LLQ tender, Bowel sound  audible.  Extremity: No edema.  Neurological: Grossly non focal.  Rectal: enlarged external hemorrhoids. Guaiac negative stool.     ASSESSMENT:  (K52.9) Chronic diarrhea  (primary encounter  diagnosis)  Comment: rule out diverticulitis or colitis or GI bleed.   Plan: CBC with Differential, Comprehensive Metabolic         Panel, Sed Rate Westergren, C-Reactive Protein,        Calprotectin, Fecal SendOut, CT ABDOMEN +         PELVIS WITH CONTRAST          Call if symptoms persist or worsen.    GO TO ER if worsening symptoms.       I did review patient's past medical and family/social history, no changes noted.  Barriers to Learning assessed: none. Patient verbalizes understanding of teaching and instructions.    Dr. Marjory Lies, MD  Internal Medicine  [facility]  [place]  [place] [address]  Ph: [phone]

## 2020-08-03 NOTE — Telephone Encounter (Signed)
Physical Therapy Plan of Care dated 07/27/20 faxed back to Results.  Phillis Haggis, M.A.

## 2020-08-03 NOTE — Telephone Encounter (Signed)
S/P OV With Dr. Scheryl Darter.     Closing Encounter  Deveron Furlong, RN  PCN Nurse Triage

## 2020-08-03 NOTE — Patient Instructions (Signed)
Get labs done as ordered.      CT ABDOMEN +         PELVIS WITH CONTRAST          You have been referred to Radiology: [place][id number], [phone]. Allow two weeks for it to be transmitted and approved. If you do not hear from them by two weeks, call the specialty clinic for an appointment.      Call if symptoms persist or worsen.    GO TO ER if worsening symptoms.

## 2020-08-03 NOTE — Telephone Encounter (Signed)
3 patient identifiers used.  Paula Daugherty is a 75yrold female  Per:  patient.    Disposition:  Appointment given per protocol  Per:  patient verbalizes agreement to plan. Agrees to callback with any increase in symptoms/concerns or questions.    See Assessment Below  ClearTriage Note:   Paula Daugherty she states she wants to talk to Dr. CVirgel Bouquet H/O Carcinoid cancer and sees him every year. This will be 5 years this December. She is learning to live with the situation and using depends when she goes places as she never knows when it's coming. Has been watching her stool and it's usually very dark like a dark green and sometimes black since March 1st a month ago after she had been on antibiotics. Now it's foul smell as well.  This AM she has had diarrhea twice already. Wednesday between 8-noon this week it was 7 times. States it's almost like silt laying in the bottom of the toilet when she goes now. Not taking Fe or Pepto Bismol. Started a probiotic. Medication regime hasn't changed. Has to apply pressure to empty bladder. She [place] an upcoming [place] She leaves [place] 26th and comes back May 19th. She can come to a clinic appt through 4/25 and then again starting 5/20. Appointment (OV) scheduled for today per protocol, pt request and schedule availability. She verbalizes understanding, agrees with the plan, and has no further questions or concerns at this time.     Protocol Used: Diarrhea (Adult)  Protocol-Based Disposition: See HCP Within 4 Hours (or PCP Triage or Video Visit)    Video visit not offered    Positive Triage Question:  * [1] SEVERE diarrhea (e.g., 7 or more times / day more than normal) AND [2] age > 60 years    Negative Triage Questions:  * Shock suspected (e.g., cold/pale/clammy skin, too weak to stand, low BP, rapid pulse)  * Difficult to awaken or acting confused (e.g.,  disoriented, slurred speech)  * Sounds like a life-threatening emergency to the triager  * [1] SEVERE abdominal pain (e.g., excruciating) AND [2] present > 1 hour  * [1] SEVERE abdominal pain AND [2] age > 60 years  * [1] Blood in the stool AND [2] moderate or large amount of blood  * Black or tarry bowel movements (Exception: chronic-unchanged black-grey bowel movements AND is taking iron pills or Pepto-Bismol)  * [1] Drinking very little AND [2] dehydration suspected (e.g., no urine > 12 hours, very dry mouth, very lightheaded)  * Patient sounds very sick or weak to the triager      HDeveron Furlong RN  PCN Nurse Triage

## 2020-08-04 ENCOUNTER — Encounter: Payer: Self-pay | Admitting: Otolaryngology

## 2020-08-08 ENCOUNTER — Ambulatory Visit
Admission: RE | Admit: 2020-08-08 | Discharge: 2020-08-08 | Disposition: A | Discharge status: Home / Self Care | Payer: Medicare Other | Attending: Radiologic Technologist | Admitting: Radiologic Technologist

## 2020-08-08 ENCOUNTER — Ambulatory Visit: Payer: Medicare Other

## 2020-08-08 DIAGNOSIS — K529 Noninfective gastroenteritis and colitis, unspecified: Secondary | ICD-10-CM

## 2020-08-08 DIAGNOSIS — Z1231 Encounter for screening mammogram for malignant neoplasm of breast: Secondary | ICD-10-CM | POA: Insufficient documentation

## 2020-08-10 ENCOUNTER — Encounter: Payer: Self-pay | Admitting: Family Medicine

## 2020-08-10 ENCOUNTER — Ambulatory Visit
Admission: RE | Admit: 2020-08-10 | Discharge: 2020-08-10 | Disposition: A | Discharge status: Home / Self Care | Payer: Medicare Other | Source: Ambulatory Visit | Attending: Internal Medicine | Admitting: Internal Medicine

## 2020-08-10 DIAGNOSIS — K529 Noninfective gastroenteritis and colitis, unspecified: Secondary | ICD-10-CM

## 2020-08-10 NOTE — Telephone Encounter (Signed)
From: [name]  To: [name], MD  Sent: 08/10/2020 11:29 AM PDT  Subject: Issues wCT    Hello, last Friday I was sent to see Dr Beryle Lathe at the [facility] clinic for my stomach issues. She had the only opening and the nurse working with you wanted me seen that day. Dr ordered lots of labs and a CT with contrast. I went for the CT today as it was marked urgent. They sent me to [facility]. Once on the table and talking with the tech. He looked at my chart and said that in 2017 I had a small reaction with some itching. He actually did my next one in 2018 with no reaction. However since I had one in 2017 he said my CT must now be at done at the hospital with a "pre med" prep. He recommended that I contact you as my PC and ask if you can/would re order the test and the pre meds. Today has not gone well. I am at a loss on how to proceed. Please guide me. Thank you

## 2020-08-12 ENCOUNTER — Other Ambulatory Visit: Payer: Self-pay | Admitting: GASTROENTEROLOGY

## 2020-08-12 LAB — CALPROTECTIN, FECAL SENDOUT: Calprotectin,Fecal: 166 ug/g — ABNORMAL HIGH (ref <=49)

## 2020-08-13 NOTE — Telephone Encounter (Signed)
Last Refill: 04/06/2020  LOV: 04/06/2020  NOV: 10/24/2020      Burman Freestone, LVN

## 2020-08-14 ENCOUNTER — Ambulatory Visit: Payer: Medicare Other | Admitting: Family Medicine

## 2020-08-14 ENCOUNTER — Telehealth: Payer: Self-pay | Admitting: Family Medicine

## 2020-08-14 ENCOUNTER — Other Ambulatory Visit: Payer: Self-pay | Admitting: Family Medicine

## 2020-08-14 VITALS — BP 134/66 | HR 68 | Temp 97.6°F | Resp 18 | Wt 159.6 lb

## 2020-08-14 DIAGNOSIS — R197 Diarrhea, unspecified: Secondary | ICD-10-CM

## 2020-08-14 DIAGNOSIS — C7A8 Other malignant neuroendocrine tumors: Secondary | ICD-10-CM

## 2020-08-14 DIAGNOSIS — R1032 Left lower quadrant pain: Secondary | ICD-10-CM

## 2020-08-14 MED ORDER — DIPHENHYDRAMINE 25 MG TABLET
25.0000 mg | ORAL_TABLET | Freq: Once | ORAL | 0 refills | Status: AC
Start: 2020-08-14 — End: 2020-09-11

## 2020-08-14 MED ORDER — PREDNISONE 50 MG TABLET
ORAL_TABLET | ORAL | 0 refills | Status: DC
Start: 2020-08-14 — End: 2021-01-29

## 2020-08-14 MED ORDER — METOCLOPRAMIDE 5 MG TABLET
5.0000 mg | ORAL_TABLET | Freq: Three times a day (TID) | ORAL | 0 refills | Status: DC
Start: 2020-08-14 — End: 2020-08-15

## 2020-08-14 NOTE — Patient Instructions (Signed)
A CT scan has been ordered for you.  The referral has been sent to our referral coordinators who will work with your insurance company to obtain an approval for this test. You will be notified by a postcard in the mail or a telephone call when the referral has been approved. Non-urgent referrals may take up to 2 weeks for approval. Once the referral has been approved, please call [phone] option 3 to schedule an appointment in [place]. You will receive a letter or telephone call with your results.     A laboratory test has been ordered for you. No fasting.

## 2020-08-14 NOTE — Progress Notes (Signed)
Paula Daugherty is a 82yrfemale who presents with a chief complaint of "I saw one of the tests was off".    Chief Complaint   Patient presents with    Results       1. LLQ pain: previously asked to get CT scan with contrast but concern for a possible allergy to contrast delayed process; patient to have it downtown in case of a reaction and Prednisone and benadryl are needed prior   2. Diarrhea: dark and explosive x wks; prior negative C diff about 14 days ago along with GI panel also negative then   3. Stress at home: due to leave on vacation soon; daughter with a complicated relationship with parents       ROS:   .Constitutional: no fever/chills.  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 90 tablet 3    Atorvastatin (LIPITOR) 10 mg Tablet Take 1 tablet by mouth every day at bedtime. 90 tablet 3    Empagliflozin (JARDIANCE) 25 mg Tablet Take 1 tablet by mouth every day. 90 tablet 3    Insulin Glargine (LANTUS SOLOSTAR U-100 INSULIN) 100 unit/mL (3 mL) Pen Inject 40 units subcutaneously every night at bedtime. Please provide 3 month supply. 45 mL 3    Insulin Pen Needles, Disposable, (BD ULTRA-FINE III SHORT PEN NEEDLE) 31 gauge x 5/16" Use for injecting insulin 1 times per day 100 each 11    Losartan (COZAAR) 25 mg Tablet Take 1 tablet by mouth every day. 90 tablet 1     Metformin (GLUCOPHAGE) 1,000 mg tablet Take 1 tablet by mouth 2 times daily with meals. (diabetes) 180 tablet 3    Nystatin (MYCOSTATIN) 100,000 unit/gram Powder Apply to the affected area 4 times daily. 30 g 2    Omeprazole (PRILOSEC) 20 mg Delayed Release Capsule TAKE 1 CAPSULE BY MOUTH TWICE DAILY BEFORE MEALS 180 capsule 0    Ondansetron (ZOFRAN) 4 mg Tablet Take 1 tablet by mouth every 8 hours if needed. 50 tablet 1    Ondansetron (ZOFRAN-ODT) 4 mg disintegrating tablet DISSOLVE 1 TABLET ON THE TONGUE EVERY 8 HOURS AS NEEDED 50 tablet 1    ONETOUCH ULTRA TEST Strips USE TO TEST BLOOD GLUCOSE TWICE DAILY. 200 strip 3     No current facility-administered medications on file prior to visit.     Allergies:   Allergies   Allergen Reactions    Contrast Dye [Radiopaque Agent] Hives    Hydrochlorothiazide Other-Reaction in Comments     Patient reports    Januvia [Sitagliptin] Other-Reaction in Comments     Patient reports  Keflex [Cephalexin] Abdominal Pain    Lyrica [Pregabalin] Other-Reaction in Comments     Patient reports    Omnipaque [Iohexol] Other-Reaction in Comments     Patient reports    Prozac [Fluoxetine Hcl] Other-Reaction in Comments     Patient reports    Sulfa (Sulfonamide Antibiotics) Rash    Topiramate Other-Reaction in Comments     Hairfall       Social History     Socioeconomic History    Marital status: MARRIED    Number of children: 1   Occupational History    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 [employer]    Tobacco Use    Smoking status: Former Smoker     Packs/day: 0.10     Years: 20.00     Pack years: 2.00    Smokeless tobacco: Never Used    Tobacco comment: quit 30 years ago   Substance and Sexual Activity    Alcohol use: Yes     Alcohol/week: 0.0 standard drinks     Comment: rare    Drug use: No    Sexual activity: Yes     Partners: Male     Family History   Problem Relation Name Age of Onset    Diabetes Father      Heart Mother          MI at 46s      Non-contributory Brother      Non-contributory Brother         PE:  BP 134/66 (SITE: right arm, Orthostatic Position: sitting, Cuff Size: regular)   Pulse 68   Temp 36.4 C (97.6 F) (Temporal)   Resp 18   Wt 72.4 kg (159 lb 9.8 oz)   LMP 05/06/1983   BMI 29.19 kg/m   .General Appearance: healthy, alert, no distress, pleasant affect, cooperative.  Eyes:  No icterus.  Heart:  normal rate and regular rhythm, no murmurs, clicks, or gallops.  Lungs: clear to auscultation.  Abdomen: BS normal.  Abdomen soft, non-tender.  No masses or organomegaly.  Mental Status: appropriate affect, behavior and mentation     Assessment and Plan:  (R10.32) Abdominal pain, LLQ (left lower quadrant)  (primary encounter diagnosis)  Comment: along with generalized abd pain x wks; unclear diagnosis, etiology or prognosis; further evaluation needed   Plan: Comprehensive Metabolic Panel, CBC with         Differential, Iron Total, Ferritin,         Transferrin, Reticulocyte Studies, Vitamin B12,        Folate, CT ABDOMEN + PELVIS WITH CONTRAST          (R19.7) Diarrhea of presumed infectious origin  Comment: x wks associated with being explosive and 3-7 per day; negative test over 14 days ago   Plan: C Difficile Diagnostic Test          (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[place])  Comment: history of this; seeing DR Virgel Bouquet   Plan: CT ABDOMEN + PELVIS WITH CONTRAST          Number and Complexity of Problems Addressed  1 undiagnosed new problem with uncertain prognosis    Amount and/or Complexity of Data to be Reviewed and Analyzed  3+ unique tests ordered    Risk of Complications and/or Morbidity or Mortality of Patient Management  Moderate        Total encounter time including history, physical examination, and coordination of care was approximately 20 minutes of which more than 50%  was spent counseling regarding assessment/diagnosis and treatment plan. No guarantees were made regarding her medical care or treatment outcome.  Barriers to Learning: none.  Patient verbalizes understanding of teaching and instructions.    Electronically signed by:    [name], MD  [place], [place] Board of Family Medicine  Associate physician [employer], [place]   [phone]

## 2020-08-14 NOTE — Nursing Note (Signed)
PPE Used During the Visit:    Eye and Face Protection:          Body Protection:   [ ] Glasses/Goggles    [ ] Gown   [ ] Face Shield   [x] Surgical Mask   Hand Protection:   [ ] N-95 Mask     [ ] Gloves   [ ] Particulate Respirator   [ ] Half/Full Face Elastomeric Respirator   [ ] PAPR    Patient:    [x] Surgical Mask       Vital signs taken, allergies verified, screened for pain, pharmacy verified. Joni Fears, LVN

## 2020-08-14 NOTE — Telephone Encounter (Signed)
-----   Message from [name], MD sent at 08/14/2020  8:04 AM PDT -----  Please  ask her to come in and see me today; thanks.

## 2020-08-14 NOTE — Telephone Encounter (Signed)
4:30 today; thanks

## 2020-08-14 NOTE — Telephone Encounter (Signed)
No open appointments with PCP today. When would PCP like pt scheduled?    Consult with MD/DO: Please advise.    Gray Bernhardt, RN  PCN Triage

## 2020-08-14 NOTE — Telephone Encounter (Signed)
Paula Daugherty is a 75yrold female  3 patient identifiers used    Consult with MD/DO: FCelene SkeenMD's message to pt. OV scheduled with PCP at 1630 today per PCP directive. Wants PCP to be aware that pt is already scheduled to see Dr. NAlfonse Spruceon 10/24/20; appointment made after pt seen by Dr. SScheryl Darteron 08/03/20. Questions answered to pt satisfaction. Has no further questions or concerns at this time. Advised to call clinic back with questions.     JGray Bernhardt RN  PCN Triage

## 2020-08-15 ENCOUNTER — Ambulatory Visit: Payer: Medicare Other | Attending: Family Medicine

## 2020-08-15 ENCOUNTER — Ambulatory Visit: Payer: Medicare Other

## 2020-08-15 DIAGNOSIS — R1032 Left lower quadrant pain: Secondary | ICD-10-CM | POA: Insufficient documentation

## 2020-08-15 DIAGNOSIS — R197 Diarrhea, unspecified: Secondary | ICD-10-CM

## 2020-08-15 LAB — CBC WITH DIFFERENTIAL
Basophils % Auto: 0.5 %
Basophils Abs Auto: 0 10*3/uL (ref 0.0–0.2)
Eosinophils % Auto: 2.6 %
Eosinophils Abs Auto: 0.2 10*3/uL (ref 0.0–0.5)
Hematocrit: 37.1 % (ref 36.0–46.0)
Hemoglobin: 11.9 g/dL — ABNORMAL LOW (ref 12.0–16.0)
Lymphocytes % Auto: 28.8 %
Lymphocytes Abs Auto: 1.9 10*3/uL (ref 1.0–4.8)
MCH: 25.8 pg — ABNORMAL LOW (ref 27.0–33.0)
MCHC: 32.1 % (ref 32.0–36.0)
MCV: 80.3 fL (ref 80.0–100.0)
MPV: 7.9 fL (ref 6.8–10.0)
Monocytes % Auto: 6.9 %
Monocytes Abs Auto: 0.4 10*3/uL (ref 0.1–0.8)
Neutrophils % Auto: 61.2 %
Neutrophils Abs Auto: 3.9 10*3/uL (ref 1.8–7.7)
Platelet Count: 189 10*3/uL (ref 130–400)
RDW: 18.4 % — ABNORMAL HIGH (ref 0.0–14.7)
Red Blood Cell Count: 4.62 10*6/uL (ref 4.00–5.20)
White Blood Cell Count: 6.5 10*3/uL (ref 4.5–11.0)

## 2020-08-15 LAB — COMPREHENSIVE METABOLIC PANEL
Alanine Transferase (ALT): 43 U/L — ABNORMAL HIGH (ref <=33)
Albumin: 4.3 g/dL (ref 4.0–4.9)
Alkaline Phosphatase (ALP): 159 U/L — ABNORMAL HIGH (ref 35–129)
Aspartate Transaminase (AST): 53 U/L — ABNORMAL HIGH (ref <=41)
Bilirubin Total: 0.8 mg/dL (ref <=1.2)
Calcium: 9.5 mg/dL (ref 8.6–10.0)
Carbon Dioxide Total: 26 mmol/L (ref 22–29)
Chloride: 105 mmol/L (ref 98–107)
Creatinine Serum: 0.97 mg/dL (ref 0.51–1.17)
E-GFR Creatinine (Female): 59 mL/min/{1.73_m2}
Glucose: 129 mg/dL — ABNORMAL HIGH (ref 74–109)
Potassium: 4.1 mmol/L (ref 3.4–5.1)
Protein: 8.1 g/dL (ref 6.6–8.7)
Sodium: 143 mmol/L (ref 136–145)
Urea Nitrogen, Blood (BUN): 18 mg/dL (ref 6–20)

## 2020-08-15 LAB — FOLATE: Folate: 6.2 ng/mL — ABNORMAL LOW (ref >=7.0)

## 2020-08-15 LAB — VITAMIN B12: Vitamin B12: 161 pg/mL — ABNORMAL LOW (ref 213–816)

## 2020-08-15 LAB — C DIFFICILE DIAGNOSTIC TEST: C DIFFICILE TOXIN: NEGATIVE

## 2020-08-15 LAB — RETICULOCYTE STUDIES
Corrected Retic Count: 0.9 % (ref 0.4–1.5)
Hematocrit: 37.1 % (ref 36.0–46.0)
Red Blood Cell Count: 4.62 10*6/uL (ref 4.00–5.20)
Reticulocyte Count %: 1.04 % (ref 0.40–2.40)
Reticulocyte Count Abs: 48.05 10*3/uL (ref 18.50–82.50)

## 2020-08-15 NOTE — Telephone Encounter (Signed)
From: [name]  To: Georgeanne Nim, MD  Sent: 08/13/2020 8:17 PM PDT  Subject: Reglan Rx    Hi Eric, I am sorry to bother you but I have no Reglan. I had to take my last pill today. I didn't know I was out. I am still having stomach pain, bad diarrhea (stomach dumping) that is very dark but green that looks almost black. Dr Scheryl Darter did lots of tests but she feels everything is normal. I went for the CT but they felt they couldn't do at a clinic so didn't happen. Dr Scheryl Darter said I didn't have to do if symptoms got better for awhile but coming back. I got an appointment to see you as I was directed but earliest was June 22nd. I will see what you think about any further tests then. Anyway if I can get a new Rx for the Reglan for right now that would be great. As always thank you for everything.

## 2020-08-16 ENCOUNTER — Encounter: Payer: Self-pay | Admitting: Family Medicine

## 2020-08-16 NOTE — Telephone Encounter (Signed)
From: [name]  To: [name], MD  Sent: 08/15/2020 5:14 PM PDT  Subject: Labs not done    Hello, I wanted you to know that I declined the Iron Total, Ferritin and Transferrin tests. Medicare said, "not covered for your condition". I would have had to pay close to $175.00 for the tests. They may cover if I have a diagnosis connected to the need for those tests. I don't know if what the new tests show new conditions. I turned in my stool sample today. I am truly hoping that together you and Dr. Virgel Bouquet can find out what is wrong with me. Today was another disaster day with sudden, explosion diarrhea that was very dark, filled with mucus and smelled horrible. Unfortunately I was at the drug store picking up the meds you ordered. I am so tired all the time and tired of living like this. I am trusting you will find the cause. Thank you.

## 2020-08-22 ENCOUNTER — Ambulatory Visit
Admission: RE | Admit: 2020-08-22 | Discharge: 2020-08-22 | Disposition: A | Discharge status: Home / Self Care | Payer: Medicare Other | Source: Ambulatory Visit | Attending: Family Medicine | Admitting: Family Medicine

## 2020-08-22 DIAGNOSIS — R590 Localized enlarged lymph nodes: Secondary | ICD-10-CM | POA: Insufficient documentation

## 2020-08-22 DIAGNOSIS — K769 Liver disease, unspecified: Secondary | ICD-10-CM | POA: Insufficient documentation

## 2020-08-22 DIAGNOSIS — N839 Noninflammatory disorder of ovary, fallopian tube and broad ligament, unspecified: Secondary | ICD-10-CM | POA: Insufficient documentation

## 2020-08-22 DIAGNOSIS — K5790 Diverticulosis of intestine, part unspecified, without perforation or abscess without bleeding: Secondary | ICD-10-CM | POA: Insufficient documentation

## 2020-08-22 DIAGNOSIS — C7A8 Other malignant neuroendocrine tumors: Secondary | ICD-10-CM | POA: Insufficient documentation

## 2020-08-22 DIAGNOSIS — R1032 Left lower quadrant pain: Secondary | ICD-10-CM | POA: Insufficient documentation

## 2020-08-22 MED ORDER — IOHEXOL 350 MG IODINE/ML INTRAVENOUS SOLUTION - 125 ML BOTTLE
INTRAVENOUS | Status: AC
Start: 2020-08-22 — End: 2020-08-22

## 2020-08-24 ENCOUNTER — Encounter: Payer: Self-pay | Admitting: "Endocrinology

## 2020-08-24 NOTE — Telephone Encounter (Signed)
From: [name]  To: Minna Merritts, MD  Sent: 08/24/2020 9:24 AM PDT  Subject: CT Results    Hello, I wanted to share that Dr [name] had me get a CT this week. I don't know if you would automatically get to see the results. I know Dr [name] will be in touch but based on what it says I wanted to ensure that you also see it. I don't know what it all means or next steps. However I do believe that you, Dr [name] and Dr Virgel Bouquet will work as a team to deal with whatever is needed. I wholeheartedly thank all of you for your patience, support and understanding. Take care. See you in late June unless you want to see me sooner.

## 2020-08-24 NOTE — Addendum Note (Signed)
Addended by: [name] on: 08/24/2020 03:16 PM     Modules accepted: Orders

## 2020-08-27 NOTE — Telephone Encounter (Signed)
Patient called to schedule for the ultrasound, however referral hasn't been made.     Please call patient once referral is authorized, ready to be scheduled.  Patient is nothing to be available 18 of May.       [place]  [facility]

## 2020-09-05 ENCOUNTER — Encounter: Payer: Self-pay | Admitting: GASTROENTEROLOGY

## 2020-09-19 ENCOUNTER — Ambulatory Visit
Admission: RE | Admit: 2020-09-19 | Discharge: 2020-09-19 | Disposition: A | Discharge status: Home / Self Care | Payer: Medicare Other | Source: Ambulatory Visit | Attending: Family Medicine | Admitting: Family Medicine

## 2020-09-19 DIAGNOSIS — R1032 Left lower quadrant pain: Secondary | ICD-10-CM

## 2020-09-19 DIAGNOSIS — R9389 Abnormal findings on diagnostic imaging of other specified body structures: Secondary | ICD-10-CM

## 2020-09-20 ENCOUNTER — Other Ambulatory Visit: Payer: Self-pay | Admitting: Family Medicine

## 2020-09-20 DIAGNOSIS — N83202 Unspecified ovarian cyst, left side: Secondary | ICD-10-CM

## 2020-09-20 NOTE — Telephone Encounter (Signed)
From: [name]  To: [name], MD  Sent: 09/20/2020 2:59 PM PDT  Subject: MRI    Dr I received your message about the MRI referral. My question is, can I have it done without it being near my head? I ask because I have wire prosthetics in both my ears and have been told to avoid an MRI. Anyway I am anxious to get to any procedures that may help determine what is going on with me. Needless to say I am concerned about these latest findings.   I look forward to hearing from you.

## 2020-09-23 ENCOUNTER — Other Ambulatory Visit: Payer: Self-pay | Admitting: Family Medicine

## 2020-09-23 DIAGNOSIS — I10 Essential (primary) hypertension: Secondary | ICD-10-CM

## 2020-09-24 NOTE — Telephone Encounter (Signed)
Last seen by PCP 08/14/2020.

## 2020-09-28 NOTE — Progress Notes (Signed)
I examined and evaluated the patient and developed a care plan with the resident.  I reviewed and agree with the findings, assessment, and plan as summarized in the resident's note above.     Electronically signed by:    Parag Dorton C. Harrison Paulson, MD FACS  Professor  Otology, Neurotology, and Skull Base Surgery  Department of Otolaryngology - Head and Neck Surgery  [facility]  PI#:  [id number]  Pager:  [phone]

## 2020-10-03 ENCOUNTER — Encounter: Payer: Self-pay | Admitting: Family Medicine

## 2020-10-03 DIAGNOSIS — N83202 Unspecified ovarian cyst, left side: Secondary | ICD-10-CM

## 2020-10-03 DIAGNOSIS — R1904 Left lower quadrant abdominal swelling, mass and lump: Secondary | ICD-10-CM

## 2020-10-03 DIAGNOSIS — R1032 Left lower quadrant pain: Secondary | ICD-10-CM

## 2020-10-03 NOTE — Telephone Encounter (Signed)
From: [name]  To: [name], MD  Sent: 10/03/2020 9:40 AM PDT  Subject: MRI     I heard from Radiology this morning. The doctor there says NO to the test since I cannot tell them the make, model and material of the wires in my ears. I doubt [facility] has my records from 50 years ago.   Next steps?     FYI they called DJ yesterday and canceled his pulmonary Dr appointment for tomorrow. He had been waiting months. They could not rebook him until September. Totally not acceptable. I am calling that 800 number you gave me.

## 2020-10-05 NOTE — Addendum Note (Signed)
Addended by: [name] on: 10/05/2020 09:10 PM     Modules accepted: Orders

## 2020-10-08 ENCOUNTER — Ambulatory Visit: Payer: Medicare Other | Attending: Family Medicine

## 2020-10-08 DIAGNOSIS — R1032 Left lower quadrant pain: Secondary | ICD-10-CM | POA: Insufficient documentation

## 2020-10-08 DIAGNOSIS — R1904 Left lower quadrant abdominal swelling, mass and lump: Secondary | ICD-10-CM | POA: Insufficient documentation

## 2020-10-08 DIAGNOSIS — N83202 Unspecified ovarian cyst, left side: Secondary | ICD-10-CM | POA: Insufficient documentation

## 2020-10-08 LAB — BASIC METABOLIC PANEL
Calcium: 10 mg/dL (ref 8.6–10.0)
Carbon Dioxide Total: 25 mmol/L (ref 22–29)
Chloride: 102 mmol/L (ref 98–107)
Creatinine Serum: 1.07 mg/dL (ref 0.51–1.17)
E-GFR Creatinine (Female): 52 mL/min/{1.73_m2}
Glucose: 91 mg/dL (ref 74–109)
Potassium: 4.3 mmol/L (ref 3.4–5.1)
Sodium: 143 mmol/L (ref 136–145)
Urea Nitrogen, Blood (BUN): 20 mg/dL (ref 6–20)

## 2020-10-08 LAB — IRON TOTAL: Iron Total: 58 ug/dL (ref 37–158)

## 2020-10-08 LAB — TRANSFERRIN
Iron Percent Saturation: 13.8 % — ABNORMAL LOW (ref 15.0–50.0)
Iron Total: 58 ug/dL (ref 37–158)
Total Iron Binding Capacity: 420 ug/dL — ABNORMAL HIGH (ref 280–400)
Transferrin: 302 mg/dL (ref 200–360)

## 2020-10-08 LAB — FERRITIN: Ferritin: 21 ng/mL (ref 13–150)

## 2020-10-08 LAB — CA 125: CA 125: 7.9 U/mL (ref <=35.0)

## 2020-10-08 NOTE — Telephone Encounter (Signed)
Please offer 6/22 at 8:30 to this patient.    Leta Speller, MD

## 2020-10-08 NOTE — Telephone Encounter (Signed)
General Advice / Message to MD:    Wells Guiles from [facility] called in regards to Paula is calling office to get a sooner appointment and noticed in the chart that D. Gerarda Fraction has offered appointment for 10/24/2020 at 8:30 would like to know if this date is still open if so please contact Paula to schedule.              [place], [place]  Paula Daugherty

## 2020-10-09 NOTE — Telephone Encounter (Signed)
General Advice / Message to MD:    Patient calling again, would like to be scheduled on 10/24/20 at 8:30 am with Dr. Gerarda Fraction. States she was never contacted to schedule this and would like to get this scheduled as soon as possible.     Tarri Abernethy, [facility]  Patient Wells

## 2020-10-10 NOTE — Telephone Encounter (Signed)
General Advice / Message to MD:    Requesting advice about the appointment that was notaed for 10/24/20 at 8:30 AM. The patient states she has sent multiple messages and no response back to her. The patient is requesting a call back urgently with an update the patient can not wait until August. The patient can be reached at [phone].        Ward Givens

## 2020-10-10 NOTE — Telephone Encounter (Signed)
Scheduled patient

## 2020-10-11 ENCOUNTER — Ambulatory Visit: Payer: Medicare Other | Attending: "Endocrinology

## 2020-10-11 DIAGNOSIS — Z794 Long term (current) use of insulin: Secondary | ICD-10-CM | POA: Insufficient documentation

## 2020-10-11 DIAGNOSIS — E119 Type 2 diabetes mellitus without complications: Secondary | ICD-10-CM

## 2020-10-11 DIAGNOSIS — E1129 Type 2 diabetes mellitus with other diabetic kidney complication: Secondary | ICD-10-CM

## 2020-10-11 LAB — HEMOGLOBIN A1C
Hgb A1C,Glucose Est Avg: 134 mg/dL
Hgb A1C: 6.3 % — ABNORMAL HIGH (ref 3.9–5.6)

## 2020-10-23 ENCOUNTER — Ambulatory Visit: Payer: Medicare Other | Attending: "Endocrinology | Admitting: "Endocrinology

## 2020-10-23 ENCOUNTER — Encounter: Payer: Self-pay | Admitting: "Endocrinology

## 2020-10-23 DIAGNOSIS — E785 Hyperlipidemia, unspecified: Secondary | ICD-10-CM | POA: Insufficient documentation

## 2020-10-23 DIAGNOSIS — N183 Chronic kidney disease, stage 3 unspecified: Secondary | ICD-10-CM

## 2020-10-23 DIAGNOSIS — Z794 Long term (current) use of insulin: Secondary | ICD-10-CM | POA: Insufficient documentation

## 2020-10-23 DIAGNOSIS — E119 Type 2 diabetes mellitus without complications: Secondary | ICD-10-CM

## 2020-10-23 DIAGNOSIS — E278 Other specified disorders of adrenal gland: Secondary | ICD-10-CM

## 2020-10-23 DIAGNOSIS — E1129 Type 2 diabetes mellitus with other diabetic kidney complication: Secondary | ICD-10-CM | POA: Insufficient documentation

## 2020-10-23 DIAGNOSIS — R809 Proteinuria, unspecified: Secondary | ICD-10-CM

## 2020-10-23 DIAGNOSIS — Z79899 Other long term (current) drug therapy: Secondary | ICD-10-CM | POA: Insufficient documentation

## 2020-10-23 DIAGNOSIS — E1122 Type 2 diabetes mellitus with diabetic chronic kidney disease: Secondary | ICD-10-CM

## 2020-10-23 MED ORDER — METFORMIN 1,000 MG TABLET: 1000.0000 mg | ORAL_TABLET | Freq: Two times a day (BID) | ORAL | 3 refills | Status: DC

## 2020-10-23 MED ORDER — EMPAGLIFLOZIN 25 MG TABLET
25.0000 mg | ORAL_TABLET | Freq: Every day | ORAL | 3 refills | Status: DC
Start: 2020-10-23 — End: 2021-08-14

## 2020-10-23 MED ORDER — ATORVASTATIN 10 MG TABLET: 10.0000 mg | ORAL_TABLET | Freq: Every day | ORAL | 3 refills | Status: DC

## 2020-10-23 MED ORDER — INSULIN GLARGINE (U-100) 100 UNIT/ML (3 ML) SUBCUTANEOUS PEN: PEN_INJECTOR | SUBCUTANEOUS | 3 refills | Status: DC

## 2020-10-23 NOTE — Progress Notes (Signed)
Endocrinology Follow up clinic note:    Chief Complaint:  "I'm here to follow up on my diabetes."    HPI: Paula Daugherty is a 75yrold female who has a diagnosis of type 2 diabetes mellitus who presents to Endocrinology for follow up. Her history is as follows:  She was initially diagnosed with diabetes around 2000 on routine labs. She has a strong family history of diabetes so she wasn't surprised at this. She was started on Metformin around 2005 and then she was started on insulin in 2014. She was up to 64 units of Lantus at night. However, after making changes to her diet and lifestyle, she was taken off insulin in 2015. Basal insulin was added back to her regimen in 2018.      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      Interim History: She returns today for follow up. Her last visit with Endocrinology was on 07/04/2020. Since this visit, she states that overall, she has been doing ok. She continues to feel tired and deal with some abdominal discomfort. She recently had a CT scan which demonstrated a left adnexal mass and she has follow up with OB/GYN and GI tomorrow for both of these issues.   She has been monitoring her blood glucose closely and has been slowly titrating down on her Lantus dose for improved blood glucose and is now down to 18 units daily.     Current Regimen:              Lantus 18 units QHS              Metformin 1000 mg BID              Jardiance 25 mg daily  Insulin injection technique: Holds the needle in for a few seconds with each injection  Injection sites: Abdomen  Rotates sites: Yes  Discards insulin 30 days after opening: Yes  Hypoglycemia: Yes, blood glucose will sometimes drop low in the afternoons              Awareness: Yes, when blood glucose is in the 80s  Hyperglycemia: Almost never sees numbers >180     Meter brought today: Yes  Please see glucometer download scanned under the media tab  Checks blood glucose: 2x/day  Last eye exam:  12/2019    ROS:  All other systems were reviewed and are negatative except for pertinent positive and negative responses as documented in HPI.     Medications:  Medication reconciliation was performed today.   Outpatient Medications Marked as Taking for the 10/23/20 encounter (Office Visit) with SCorky Mull MD   Medication Sig Dispense Refill    Amlodipine (NORVASC) 10 mg Tablet Take 1 tablet by mouth every day. 90 tablet 3    Atorvastatin (LIPITOR) 10 mg Tablet Take 1 tablet by mouth every day at bedtime. 90 tablet 3    Empagliflozin (JARDIANCE) 25 mg Tablet Take 1 tablet by mouth every day. 90 tablet 3    Insulin Glargine (LANTUS SOLOSTAR U-100 INSULIN) 100 unit/mL (3 mL) Pen Inject 40 units subcutaneously every night at bedtime. Please provide 3 month supply. 45 mL 3    Insulin Pen Needles, Disposable, (BD ULTRA-FINE III SHORT PEN NEEDLE) 31 gauge x 5/16" Use for injecting insulin 1 times per day 100 each 11    Losartan (COZAAR) 25 mg Tablet Take 1 tablet by mouth every day. 90 tablet  0    Metformin (GLUCOPHAGE) 1,000 mg tablet Take 1 tablet by mouth 2 times daily with meals. (diabetes) 180 tablet 3    Metoclopramide (REGLAN) 5 mg Tablet GENERIC FOR REGLAN. TAKE 1 TABLET BY MOUTH THREE TIMES DAILY 30 MINUTES BEFORE EACH MEAL. 270 tablet 1    Nystatin (MYCOSTATIN) 100,000 unit/gram Powder Apply to the affected area 4 times daily. 30 g 2    Omeprazole (PRILOSEC) 20 mg Delayed Release Capsule TAKE 1 CAPSULE BY MOUTH TWICE DAILY BEFORE MEALS 180 capsule 0    Ondansetron (ZOFRAN) 4 mg Tablet Take 1 tablet by mouth every 8 hours if needed. 50 tablet 1    ONETOUCH ULTRA TEST Strips USE TO TEST BLOOD GLUCOSE TWICE DAILY. 200 strip 3     I did review patient's past medical and family/social history, no changes noted.   PMH:  Past medical history was reviewed from problem list.   Patient Active Problem List   Diagnosis    DM2 (diabetes mellitus, type 2) ([facility])    Depression with anxiety    Stress  at home    Leg cramps-right calf    Right ear pain    Fibromyalgia    HTN (hypertension)    GERD (gastroesophageal reflux disease)    Hyperlipidemia with target LDL less than 70    Vitamin D insufficiency    Abnormal LFTs    Renal cyst ( 6.4 cm cyst left kidney)    Cough    Fatty liver    Macroalbuminuric diabetic nephropathy (HCC)    History of actinic keratoses    Cataract    Ptosis of eyelid    Liver fibrosis    Adrenal nodule (HCC)    Medication Therapy Auth    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HCC)    Mixed conductive and sensorineural hearing loss of both ears    Neoplasm of uncertain behavior of skin    Abdominal pain, generalized    Nausea without vomiting    Asymptomatic microscopic hematuria    Subcutaneous nodule    Angina pectoris (HCC)    Mild episode of recurrent major depressive disorder (HCC)    NASH (nonalcoholic steatohepatitis)     VITAL SIGNS:  BP 125/79 (SITE: left arm, Orthostatic Position: sitting, Cuff Size: regular)   Pulse 62   Temp 36.2 C (97.1 F) (Temporal)   Resp 16   Ht 1.575 m (5' 2" )   Wt 71.1 kg (156 lb 12 oz)   LMP 05/06/1983   SpO2 98%   BMI 28.67 kg/m   Body mass index is 28.67 kg/m.    PHYSICAL EXAM:  General Appearance: healthy, alert, no distress, pleasant affect, cooperative.  Eyes:  conjunctivae and corneas clear. EOM's intact. sclerae normal.  Neck:  Neck supple.   Extremities:  no cyanosis, clubbing, or edema.  Skin:  Skin color, texture, turgor normal. No rashes or lesions.  Neuro: Gait normal. No tremor appreciated.  Sensation and strength grossly normal.  Mental Status: Appearance/Cooperation: in no apparent distress and well developed and well nourished  Eye Contact: normal  Speech: normal volume, rate, and pitch    LAB TESTS/STUDIES:   I personally reviewed the following laboratory and imaging studies.     08/15/2020 11:29 10/08/2020 14:06 10/08/2020 14:06 10/11/2020 10:54   Sodium 143 143     Potassium 4.1 4.3     Chloride  105 102     Carbon Dioxide Total 26 25     Urea Nitrogen, Blood (BUN)  18 20     Creatinine Blood 0.97 1.07     Glucose 129 (H) 91     Calcium 9.5 10.0     Protein 8.1      Albumin 4.3      Alkaline Phosphatase (ALP) 159 (H)      Aspartate Transaminase (AST) 53 (H)      Bilirubin Total 0.8      Alanine Transferase (ALT) 43 (H)      E-GFR (Female) 59 52     Iron Total  58 58    Transferrin  302     Total Iron Binding Capacity  420 (H)     Iron Percent Saturation  13.8 (L)     Ferritin  21     Folate 6.2 (L)      CA 125  7.9     Hgb A1C    6.3 (H)   Hgb A1C,Glucose Est Avg    134   Vitamin B12 161 (L)         02/23/2020 10:27   Cholesterol 136   Triglyceride 148   LDL Cholesterol Calculation 54   HDL Cholesterol 52   Non-HDL Cholesterol 84   Total Cholesterol:HDL Ratio 2.6      08/15/2020 11:29   White Blood Cell Count 6.5   Hemoglobin 11.9 (L)   Hematocrit 37.1   MCV 80.3   RDW 18.4 (H)   Platelet Count 189      11/02/2019 12:00   Creatinine Spot Urine 150.47   Microalbumin Urine 16.9   Microalbumin/Creatinine Ratio 112 (H)     03/27/2020:  Component Ref Range & Units    TOTAL VOLUME mL 1250    TIME OF COLLECTION hr 24    DOPAMINE,UR PER VOLUME ug/L 47    DOPAMINE,UR PER 24HR 71 - 485 ug/d 59Low    DOPAMINE,UR RATIO TO CRT 0 - 250 ug/g CRT 67    NOREPINEPHRINE,UR PER VOLUME ug/L 21    NOREPINEPHRINE,UR PER 24HR 14 - 120 ug/d 26    NOREPINEPHRINE,UR RATIO TO CRT 0 - 45 ug/g CRT 30    EPINEPHRINE,UR PER VOLUME ug/L <1    EPINEPHRINE,UR PER 24 HR 1 - 14 ug/d <1    EPINEPHRINE,UR RATIO TO CRT 0 - 20 ug/g CRT <1      Component Ref Range & Units    TOTAL VOLUME mL 1250    TIME OF COLLECTION hr 24    CORTISOL,UR FREE PER VOLUME ug/L 10.80    CORTISOL,UR FREE PER 24HR <=45.0 ug/d 13.5    CORTISOL,UR FREE RATIO TO CRT       Component Ref Range & Units    Urine Volume mL 1,250    START DATE  03/26/2020    START TIME  8:07 AM    STOP DATE  03/27/2020    STOP TIME  8:07 AM    COLLECTION INTERVAL, URINE H 24.00    Creatinine  Conc mg/dL 72.61    Creatinine 24 Hr 800-2,000 mg/24Hr 908      Component Ref Range & Units    TOTAL VOLUME mL 1250    TIME OF COLLECTION hr 24    METANEPHRINE,UR-PER VOLUME ug/L 78    METANEPHRINE,UR-PER 24HR 36 - 229 ug/d 98    METANEPHRINE,UR-RATIO TO CRT 0 - 300 ug/g CRT 116    NORMETANEPHRINE,UR-PER VOLUME ug/L 317    NORMETANEPHRINE,UR-PER 24HR 95 - 650 ug/d 396    NORMETANEPHRIN,UR-RATIO TO CRT 0 - 400  ug/g CRT 473High    METANEPHRINES INTERPRETATION  See Note    CREATININE,UR PER VOLUME mg/dL 67    CREATININE,UR PER 24HR 500 - 1400 mg/d 838      CT Abdomen and Pelvis (08/22/2020):  FINDINGS:  Lower Chest: Unremarkable.  Liver: A few scattered small hypodensities too small to characterize. Mild  liver surface nodularity.  Bile Ducts: Unremarkable.  Gallbladder: Unremarkable.  Pancreas: Unremarkable.  Spleen: Unremarkable.  Adrenal Glands: Unremarkable.  Kidneys: Decreased size of left posterior renal cyst with some peripheral  calcifications.  GI Tract: Colonic diverticulosis without diverticulitis. Small soft tissue  nodule anterior to the descending colon previously had fat density and  therefore likely represents an small area of fat process or old torsed  epiploic appendage. Normal appendix.  Peritoneal Cavity: No free fluid or free air.  Bladder: Unremarkable.  Uterus and Ovaries: Status post hysterectomy. This 3.0 cm cystic lesion of  the left adnexa that is either new or increased from the prior. It does not  have simple fluid attenuation.  Lymph Nodes: Prominent upper abdominal lymph nodes are slightly increased  including a 1.7 lymph node anterior to the hepatic artery, previously 1.2  cm  Major Vascular Structures: There is atherosclerosis of the aorta and branch  vessels.  Soft Tissues: Unremarkable.  Musculoskeletal: Unremarkable.    IMPRESSION:  1. Diverticulosis without diverticulitis.  2. Increasing/new 3.0 cm nonsimple fluid cystic lesion of the left  adnexa. Recommend further evaluation  with pelvic ultrasound.  3. Liver surface nodularity suggesting fibrosis.  4. Slight increased upper abdominal mild lymphadenopathy measuring up  to 1.7 cm is nonspecific. The slow growth argues against an aggressive  process. If patient proves to have chronic liver disease, mild upper  abdominal lymphadenopathy is often associated with this.    Impression: This is a 75yrold female with Diabetes Mellitus Type II who presents to Endocrinology for follow up. Overall, her glycemic control is at goal as evidenced by her most recent A1c  of 6.3%.  Review of her blood glucose meter download today demonstrates overall well controlled blood glucose with minimal hyperglycemia and some increase in hypoglycemia at times in the afternoon. We discussed adjustment of her insulin dose as needed for low blood glucose readings in the afternoon.     She was encouraged to report any hypoglycemia or persistent hyperglycemia prior to follow up.      She also has a history of an adrenal nodule. Hormonal evaluation was normal in11/2021.     DIABETES HISTORY  (-) h/o retinopathy.      (-) h/o microalbuminuria/nephropathy.  (-) h/o neuropathy.  (-) h/o Autonomic dysfunction  (-) h/o Gastroparesis  (-) h/o CAD  (-) h/o PVD     Last eye exam:  12/2019  Last urine microalbumin: 10/2019, 112  Pneumovax: 12/2014, Prevnar 2018  Influenza vaccine: fall 2021  (-) ASA  (+) Statin  (+) ACE/ARB          Recommendations:  (E11.29) Type 2 diabetes mellitus with other diabetic kidney complication  (primary encounter diagnosis)  - Continue current dose of metformin, Jardiance, and Lantus  - Encouraged dietary changes as the patient has been working on  - Continue titration down of Lantus dose as appropriate  - Encouraged patient to continue close blood glucose monitoring   - Last LDL at goal, continue statin, repeat due 02/2021  - Last microalbumin:creatinine ratio elevated, continue cozaar, repeat due 10/2020  - Up to date on screening eye exam, follow  up  as scheduled in 12/2020  - Repeat A1c prior to follow up appointment  - Influenza vaccine and COVID-19 vaccinations up to date  - Discussed daily foot care  - Continue ARB     2. Adrenal nodule  - Biochemical evaluation normal in 01/2019, stability by imaging in 04/2017  - Repeat hormonal evaluation due in fall 2022 (~1 year from previous) for 5 years total (2022)   - Will message radiology regarding recent CT scan as no mention of adrenal nodule made in the report    Follow up in 3 months    If you have any questions, please do not hesitate to contact me at [phone].  Thank you for allowing me to participate in the care of this patient.    EDUCATION:  I educated/instructed the patient or caregiver regarding all aspects of the above stated plan of care.  The patient or caregiver indicated understanding.      [place] interpreter was not used.    Report electronically signed by:  Sunday Spillers, M.D.  Clinical Associate Professor  Department of Endocrinology

## 2020-10-23 NOTE — Nursing Note (Addendum)
Identifiers x 2, Vital signs taken, allergies verified, screened for pain, and tobacco history reviewed, preferred pharmacy verified, Pain assessed, SOGI complete yes. Refills needed, no.   Patient had a loop mask on upon rooming and was instructed to continue to wear mask for the rest of the visit. I had a loop mask, gloves and face shield during the rooming process of patient care.    Download meter completed today.    Gean Birchwood, RN

## 2020-10-24 ENCOUNTER — Encounter: Payer: Self-pay | Admitting: Gastroenterology

## 2020-10-24 ENCOUNTER — Ambulatory Visit (INDEPENDENT_AMBULATORY_CARE_PROVIDER_SITE_OTHER): Payer: Medicare Other | Admitting: Obstetrics & Gynecology

## 2020-10-24 ENCOUNTER — Ambulatory Visit: Payer: Medicare Other | Attending: GASTROENTEROLOGY | Admitting: Gastroenterology

## 2020-10-24 ENCOUNTER — Encounter: Payer: Self-pay | Admitting: Obstetrics & Gynecology

## 2020-10-24 VITALS — BP 146/74 | HR 68 | Temp 97.0°F | Resp 17 | Ht 62.0 in | Wt 157.6 lb

## 2020-10-24 VITALS — BP 145/83 | HR 68 | Wt 157.4 lb

## 2020-10-24 DIAGNOSIS — M62838 Other muscle spasm: Secondary | ICD-10-CM

## 2020-10-24 DIAGNOSIS — Z86010 Personal history of colonic polyps: Secondary | ICD-10-CM | POA: Insufficient documentation

## 2020-10-24 DIAGNOSIS — K7581 Nonalcoholic steatohepatitis (NASH): Secondary | ICD-10-CM | POA: Insufficient documentation

## 2020-10-24 DIAGNOSIS — R112 Nausea with vomiting, unspecified: Secondary | ICD-10-CM | POA: Insufficient documentation

## 2020-10-24 DIAGNOSIS — C7A8 Other malignant neuroendocrine tumors: Secondary | ICD-10-CM

## 2020-10-24 DIAGNOSIS — N9489 Other specified conditions associated with female genital organs and menstrual cycle: Secondary | ICD-10-CM

## 2020-10-24 DIAGNOSIS — R1909 Other intra-abdominal and pelvic swelling, mass and lump: Secondary | ICD-10-CM

## 2020-10-24 DIAGNOSIS — K3184 Gastroparesis: Secondary | ICD-10-CM

## 2020-10-24 DIAGNOSIS — D126 Benign neoplasm of colon, unspecified: Secondary | ICD-10-CM

## 2020-10-24 DIAGNOSIS — G893 Neoplasm related pain (acute) (chronic): Secondary | ICD-10-CM

## 2020-10-24 NOTE — Progress Notes (Signed)
Hepatology Outpatient Progress Note    Patient Name: Paula Daugherty  MRN: [id number]  Date of Service: 10/24/2020  Referring Provider: [name], MD  Attending Physician: Georgeanne Nim MD, MPH    REASON FOR CONSULTATION: NASH and gastroparesis    SUBJECTIVE:  Paula Daugherty is a 75yrold female with history of DM and obesity BMI 32.  She has fatty liver seen on [place]2017 which showed fat in her liver without splenomegaly and a liver fibrosis score of 12.9 (F3) suggestive of severe fibrosis.  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  She had chronic nausea and vomiting recently diagnosed with gastroparesis on GES.  She has had symptomatic relief from Reglan, but has since stopped it due to the risk of side effects.  Now she is managing with zofran as needed for nausea. Weight has been stable.    10/24/2020 Clinic Visit  EGD with biopsies has not revealed any recurrence of neuroendocrine tumor since 04/2020. Reports ongoing nausea that is relieved by zofran. Occasionally takes reglan. Has both diarrhea and constipation, but more often diarrhea. No heart racing. Thinks is still having hot flashes. Reports left lower quadrant pain, [facility] 09/[facility]/2020 [place] with complex cystic 4x3x3 cm left ovary, to be re-evaluated with MR pelvis. AST/ALT slightly elevated from baseline recently 53/43; has lost 20lbs in past 2 years.    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    Psychiatric illness        ALLERGIES:    Hydrochlorothiazide    Other-Reaction  in Comments    Comment:Patient reports  Januvia [Sitagliptin]    Other-Reaction in Comments    Comment:Patient reports  Keflex [Cephalexin]    Abdominal Pain  Lyrica [Pregabalin]    Other-Reaction in Comments    Comment:Patient reports  Omnipaque [Iohexol]    Other-Reaction in Comments    Comment:Patient reports  Prozac [Fluoxetine Hcl]    Other-Reaction in Comments    Comment:Patient reports  Sulfa (Sulfonamide Antibiotics)    Rash  Topiramate    Other-Reaction in Comments    Comment:Hairfall    MEDICATIONS:  Current Outpatient Medications on File Prior to Visit   Medication Sig Dispense Refill    Amlodipine (NORVASC) 10 mg Tablet Take 1 tablet by mouth every day. 90 tablet 3    Atorvastatin (LIPITOR) 10 mg Tablet Take 1 tablet by mouth every day at bedtime. 90 tablet 3    Empagliflozin (JARDIANCE) 25 mg Tablet Take 1 tablet by mouth every day. 90 tablet 3    Insulin Glargine (LANTUS SOLOSTAR U-100 INSULIN) 100 unit/mL (3 mL) Pen Inject 40 units subcutaneously every night at bedtime. Please provide 3 month supply. 45 mL 3    Insulin Pen Needles, Disposable, (BD ULTRA-FINE III SHORT PEN NEEDLE) 31 gauge x 5/16" Use for injecting insulin 1 times per day 100 each 11  Losartan (COZAAR) 25 mg Tablet Take 1 tablet by mouth every day. 90 tablet 0    Metformin (GLUCOPHAGE) 1,000 mg tablet Take 1 tablet by mouth 2 times daily with meals. (diabetes) 180 tablet 3    Metoclopramide (REGLAN) 5 mg Tablet GENERIC FOR REGLAN. TAKE 1 TABLET BY MOUTH THREE TIMES DAILY 30 MINUTES BEFORE EACH MEAL. 270 tablet 1    Nystatin (MYCOSTATIN) 100,000 unit/gram Powder Apply to the affected area 4 times daily. 30 g 2    Omeprazole (PRILOSEC) 20 mg Delayed Release Capsule TAKE 1 CAPSULE BY MOUTH TWICE DAILY BEFORE MEALS 180 capsule 0    Ondansetron (ZOFRAN) 4 mg Tablet Take 1 tablet by mouth every 8 hours if needed. 50 tablet 1    Ondansetron (ZOFRAN-ODT) 4 mg disintegrating tablet DISSOLVE 1 TABLET ON THE TONGUE EVERY 8 HOURS AS  NEEDED 50 tablet 1    ONETOUCH ULTRA TEST Strips USE TO TEST BLOOD GLUCOSE TWICE DAILY. 200 strip 3     No current facility-administered medications on file prior to visit.       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  Eyes: negative.  Ears, Nose, Mouth, Throat: negative.  CV: negative.  Resp: negative.  GI: vomiting, nausea.  GU: negative.  Musculoskeletal: negative.  Integumentary: negative.  Neuro: negative.  Psych: Mood pt's report, euthymic.  Endo: negative.  Heme/Lymphatic: negative.  Allergy/Immun: negative.     PHYSICAL EXAMINATION:  Temp: 36.1 C (97 F) (06/22 1005)  Temp src: --  Pulse: 68 (06/22 1005)  BP: 146/74 (06/22 1155)  Resp: 17 (06/22 1005)  SpO2: 98 % (06/22 1005)  Height: 157.5 cm (5' 2" ) (06/22 1005)  Weight: 71.5 kg (157 lb 10.1 oz) (06/22 1005)    GENERAL: well appearing, well nourished.  ABDOMEN: Soft, nontender, and nondistended.  Normal bowel sounds.  No hepatosplenomegaly was noted.  EXTREMITIES: Without any cyanosis, clubbing, or edema.  NEUROLOGIC: Alert and oriented x4, no asterixis  PSYCHIATRIC: Normal mood and affect.  SKIN: No rashes seen.    LABORATORY EXAMINATIONS:   Lab Results   Lab Name Value Date/Time    NA 143 10/08/2020 02:06 PM    NA 142  08/30/2015 08:35 AM    K 4.3 10/08/2020 02:06 PM    K 4.0 08/30/2015 08:35 AM    CL 102 10/08/2020 02:06 PM    CL 104  08/30/2015 08:35 AM    CO2 25 10/08/2020 02:06 PM    CO2 22 (L) 08/30/2015 08:35 AM    BUN 20 10/08/2020 02:06 PM    BUN 19 08/30/2015 08:35 AM    CR 1.07 10/08/2020 02:06 PM    CR 0.88 08/30/2015 08:35 AM    GLU 91 10/08/2020 02:06 PM    GLU 139 (H) 08/30/2015 08:35 AM     Lab Results   Lab Name Value Date/Time    WBC 6.5 08/15/2020 11:29 AM    WBC 8.9 08/30/2015 08:35 AM    HGB 11.9 (L) 08/15/2020 11:29 AM    HGB 14.4 08/30/2015 08:35 AM    HCT 37.1 08/15/2020 11:29 AM    HCT 37.1 08/15/2020 11:29 AM    HCT 44.6 08/30/2015 08:35 AM    PLT 189 08/15/2020 11:29 AM    PLT 217 08/30/2015 08:35 AM     Lab Results   Lab Name Value Date/Time    AST 53 (H) 08/15/2020 11:29 AM    AST 71 (H) 08/30/2015 08:35 AM    ALT 43 (H) 08/15/2020 11:29 AM    ALT 69 (H) 08/30/2015 08:35 AM    ALP 159 (H) 08/15/2020 11:29 AM    ALP 104 08/30/2015 08:35 AM    ALB 4.3 08/15/2020 11:29 AM    ALB 4.2 08/30/2015 08:35 AM    TP 8.1 08/15/2020 11:29 AM    TP 8.5 (H) 08/30/2015 08:35 AM    TBIL 0.8 08/15/2020 11:29 AM    TBIL 0.9 08/30/2015 08:35 AM     Lab Results   Component Value Date    INR 1.00 01/08/2016     Lab Results   Component Value Date    VITD25 34.4 03/17/2016       VIRAL SEROLOGIES:  No results found for this or any previous visit.  Lab Results   Component Value Date    HBSAG Nonreactive 12/08/2013     Lab Results   Component Value Date    HEPBABQT 153.46 12/08/2013     No results found for: ANTIHBCT  No results found for this or any previous visit.  No results found for this or any previous visit.  No results found for this or any previous visit.    Lab Results   Component Value Date    HEPCABSCR Nonreactive 12/08/2013     No results found for this or any previous visit.  No results found for this or any previous visit.  No results found for: HIV1AND2SCR    AUTOIMMUNE:  No results found for: ANASCREEN  No results  found for: IGG  No results found for this or any previous visit.  No results found for this or any previous visit.    OTHER:  Lab Results   Component Value Date    FRTN 21 10/08/2020     No results found for this or any previous visit.  No results found for this or any previous visit.    STUDIES:  Colonoscopy 2015  DIAGNOSIS                  A.  CECUM, POLYP (SNARE POLYPECTOMY):       -  Fragments of tubular adenoma (see Comment)       -  No high-grade dysplasia or malignancy        B.  RECTUM, ULCER (BIOPSY):       -  Colonic mucosa with focal erosion and reactive epithelial  changes       -  No dysplasia or malignancy       -  Deeper levels examined     EGD 02/2016   A.  DUODENUM, POLYP (BIOPSY):       -  Well differentiated neuroendocrine tumor, Grade 1, transected, see         comment       -  Maximum size: 7.5 mm (measured on slide)       -  Ki67 <2%       -  Synaptophysin positive        Comment: This case has been reviewed in intradepartmental consultation.     STUDIES:  2017 EGD    Impression:   1. 1-1.5 cm duodenal polyp, s/p Snare cautery and hemoclip x1.  2. Mild antral gastritis  3. No evidence of varices.    Recommendation/Plan:   Await pathology to r/o malignancy.      DIAGNOSIS                  A.  DUODENUM, POLYP (BIOPSY):       -  Well differentiated neuroendocrine tumor, Grade 1, transected, see         comment       -  Maximum size: 7.5 mm (measured on slide)       -  Ki67 <2%       -  Synaptophysin positive        Comment: This case has been reviewed in intradepartmental consultation.     2019 Colonoscopy  Impression:    Benign appearing colon polyps, removed as above.  Colonic diverticulosis as described above  Internal hemorrhoids as described above.  External hemorrhoids as described above.    Recommendation/Plan:    Repeat colonoscopy in 3  years.  Follow up with primary care physician, as needed.  High fiber diet and daily metamucil  GI clinic follow up      FINAL DIAGNOSIS     A.        COLON, ASCENDING, POLYPS X2 (BIOPSY):  -   Tubular adenoma (x2)      B.        COLON, TRANSVERSE, POLYPS X3 (BIOPSY):  -   Tubular adenoma (x3)             04/08/2019 EGD    Findings and Interventions:   Esophageal mucosa normal  Esophageal-gastric junction localized at 37 cm.  Squamo-columnar junction regular  Hiatal hernia absent  Gastric mucosa mild gastritis, more pronounced in antrum. Random biopsies in antrum and body obtained.  Duodenal mucosa site of prior NET tumor without evidence of obvious recurrence. Biopsied. No evidence of duodenitis.   Retroflexed view normal    Impression:   1. Mild gastritis, s/p random biopsies  2. No obvious endoscopic recurrence of NET; biopsies obtained.     FINAL DIAGNOSIS     A.        DUODENUM,  (BIOPSY):  -   Duodenal mucosa with no significant histopathologic changes  -   No features of neuroendocrine tumor  -   Deeper levels examined             B.        STOMACH,  (BIOPSY):  -   Mild chronic oxyntic gastritis, no activity  -   No H. Pylori  -   No intestinal metaplasia/dysplasia         04/06/2020 EGD    Findings  and Interventions:   Esophageal mucosa normal  Esophageal-gastric junction localized at 35 cm.  Squamo-columnar junction regular  Hiatal hernia absent  Gastric mucosa with diffuse redness in the antrum s/p biopsy  Duodenal mucosa normal no residual tumor identified  Retroflexed view normal    Impression:   Gastritis was found and biopsied.  No residual duodenal tumor was found.    FINAL DIAGNOSIS     A.        DUODENUM, BIOPSY (BIOPSY):  -   Duodenal mucosa without diagnostic abnormality  -   No villus blunting or increased intraepithelial lymphocytes  -   No malignancy identified            B.        STOMACH, BIOPSY (BIOPSY):  -   Antral mucosa with mild chemical gastritis  -   Oxyntic mucosa without  diagnostic abnormality  -   Negative for Helicobacter, intestinal metaplasia, or dysplasia     07/01/2016 NM GES  IMPRESSION:      1. Normal solid gastric retention of 49.82 % at 2 hours.     2. Elevated solid gastric retention of 17.15 % at 4 hours.    08/22/2020 CTAP  IMPRESSION:  1. Diverticulosis without diverticulitis.  2. Increasing/new 3.0 cm nonsimple fluid cystic lesion of the left  adnexa. Recommend further evaluation with pelvic ultrasound.  3. Liver surface nodularity suggesting fibrosis.  4. Slight increased upper abdominal mild lymphadenopathy measuring up  to 1.7 cm is nonspecific. The slow growth argues against an aggressive  process. If patient proves to have chronic liver disease, mild upper  abdominal lymphadenopathy is often associated with this.       Assessment and Plan:  1. NASH: based on Fibroscan, she has F3 disease.  She will continue modify her diet and meets regularly with a dietician to discuss. AST/ALT slightly elevated despite weight loss, will monitor.  2. Gastroparesis confirmed by gastric emptying study 06/2016: she had symptomatic relief using Reglan, but we cannot continue this long term because of the risk for tardive dyskinesia; is using it sparingly. Overall she is managing fine with zofran as needed for nausea.  We did discuss gastric pacemaker, but she does not feel that her symptoms were bad enough to warrant this referral.  3. Carcinoid: no evidence of residual disease on EGD, last 04/2020. DOTATATE and gastrin normal in 2017/2018, no evidence for metastatic disease or ZES. Her ongoing nausea and vomiting are more likely related to gastroparesis from T2DM.  - f/u EGD in 04/2020  4. CRC screening: 2019 colo with x5 TAs, due in 2022.  - f/u colonoscopy in 04/2020       No orders of the defined types were placed in this encounter.    Return in about 6 months (around 04/25/2021) for f/u Dr. Virgel Bouquet.    Norris Cross, MD  Gastroenterology & Hepatology Fellow  [facility]  Preferred contact: [place]    Attending attestation: I saw and evaluated the patient.  I agree with the findings and the plan of care as documented in the fellow's note.    Georgeanne Nim MD, MPH  Division of Gastroenterology and Hepatology    Number and Complexity of Problems Addressed  2 or more stable chronic illnesses  1 acute, uncomplicated illness or injury    Amount and/or Complexity of Data to be Reviewed and Analyzed  3+ review of the results of each unique test  2 unique tests ordered

## 2020-10-24 NOTE — Patient Instructions (Addendum)
- Your liver enzyme tests are elevated, likely from underlying NASH. Continue exercise and weight loss (you have made great progress)  - Upper endoscopy and colonoscopy ordered  - continue reglan sparingly and zofran as needed        COLONOSCOPY/EGD PREPARATION INSTRUCTIONS      For proper bowel preparation, read and follow these instructions:   If you need to cancel or reschedule appointment, call at least 3 days in advance.    You must have a driver who will wait at the clinic or be available to return within 15 minutes of a phone call. No bus, taxi, [place], or [place] allowed unless you are with an adult friend or family member. No driving the remainder of the day.    7 Days Before Procedure   -- If you are taking blood thinners such as: clopidogrel bisulfate (Plavix), warfarin (Coumadin, Jantoven), rivaroxaban (Xarelto), apixaban (Eliquis), ticagrelor (Brilinta), dabigatran (Pradaxa), or heparin contact the prescribing physician right away for instruction.  -- If you have diabetes and are using insulin or are taking oral diabetes medications, contact your primary care doctor for instruction on adjusting these medications for the day of your procedure.  -- Call your pharmacy about your colon preparation medication prescription. Tell them it was sent a couple months before.     5 Days Before Procedure  -- Avoid ibuprofen (Motrin, Advil), naproxen (Aleve, Naprosyn), celecoxib (Celebrex), or medications containing aspirin 5 days prior to procedure. Okay to take aspirin (81 mg) and aspirin (356m). No iron and fiber supplements. You may take acetaminophen (e.g. Tylenol) if needed.   -- Stop eating high fiber foods including, nuts, corn, popcorn, peanut butter, berries, salad, raw vegetables with skin, and high fiber cereals.    1 Day Before Procedure  -- Drink only clear liquids all day. Foods allowed are clear broth, jello, Gatorade, clear fruit juice such as white grape juice or apple juice, popsicles,  coffee and tea without creamer. Avoid red or purple drinks.  -- Do not eat any solid foods, milk, dairy products, [place] juice, alcohol, and other non-clear beverages.  -- 4-6 pm: Drink dose 1 of the bowel preparation. See page 3 for directions.    Day of Procedure  -- 4 hours before your procedure: drink dose 2 of bowel preparation.  -- 3 hours before your procedure, do not drink anything.  -- If you take medication for blood pressure, heart, seizure, or pain, please take these with just a sip of water at least 3 hours before your appointment.   -- Remove all jewelry and body piercings before your arrival. If you use a C-PAP machine, inhaler, or Bi-Pap, please bring these with you.  -- When you finish the prep solution, the stools should be yellow to clear.  Helpful Tips    -- Refrigerate the prep solution (except for Clenpiq) and drink with a straw.          -- Drink other clear liquids between prep drinks.  -- Crystal Light drink mix added to the prep may make it taste better.  -- Drink the prep slowly.  -- The prep solution will start working within a couple of hours. We suggest you stay home near a toilet.    If you have any questions or concerns, call the clinic by 5pm. After 5pm, call [phone] and ask to speak to GI Fellow on call.           [place]     Main GI Lab          [facility]      [facility]     [facility]    [facility]    [address]             GI Lab            [address],            [place]                      Drive               for  Health     [address] [place]               [address], [place]  [place], [place], [place], [place]              [address]          [address]                   [id number]                   [phone]              [phone]       [address] [phone]  [phone]       (  [phone]     [phone]                                    [place], [place]                                                                                                                      [phone]      COLONOSCOPY PREPARATION INSTRUCTIONS    DIRECTIONS FOR TAKING BOWEL PREPARATION    You will be prescribed ONE of the following bowel preps for your procedure to pick up at your pharmacy      SUPREP:    Dose 1: 4-6PM day before procedure:    -- Pour ONE 6-ounce bottle of SUPREP liquid into the mixing container.  -- Add cool water to the 16-ounce line on the container and mix.   -- Drink ALL the liquid in the container. Then you must drink two 16-ounce glasses of plain water in 1 hour. Continue clear liquids until bedtime.     Dose 2: 4 hours before procedure:  -- Repeat steps above using the other 6-ounce bottle.      MOVIPREP:    Dose 1: 4-6PM day before procedure:   -- Empty one pouch A and one pouch B into the disposable container. Add lukewarm drinking water to the top line of the container and mix. Refrigerate solution prior to drinking.  -- The MoviPrep container is divided by 4 marks.  Every 15 minutes, drink the solution down to the next mark (about 8 ounces), until the full liter is finished.   -- Drink 16 ounces of the clear liquid of your choice. Continue drinking clear liquids until bedtime.    Dose 2: 4 hours before procedure:  --For this dose, repeat steps above.      COLYTE (Gavilyte/Golytely):    Dose 1: 4-6PM day before procedure:  --  Fill the container to the line marked on the outside of the bottle with tap water. Drink 16 ounces every 15 minutes until half of the bottle is gone.   -- Refrigerate the remaining amount to complete the following day. Continue clear liquids until bedtime.    Dose  2: 4 hours before procedure:   -- For this dose, drink remainder of the container.      CLENPIQ:  PLEASE DO NOT REFRIGERATE    Dose 1: 4-6PM day before procedure:  Hydration is important and it's part of the prep. Drink plenty of water before, during, and after taking the prep.   Drink ONE bottle of Clenpiq right from the bottle.   Drink 1 cup (8 ounces) of clear liquids every 10 minutes for a total of 5 cups.   Continue clear liquids until bedtime.   Dose 2: 4 hours before procedure:  Drink the other bottle of Clenpiq right from the bottle.   Drink 1 cup (8 ounces) of clear liquids every 10 minutes for a total of 5 cups.   Do not drink anything 3 hours before your appointment.      PLENVU:    Dose 1: 4-6PM day before procedure:  Use the mixing container to mix the contents of the Dose 1 pouch with at least 16 ounces of water by shaking or using a spoon until it's completely dissolved. This may take up to 2 to 3 minutes. Take your time- slowly finish the dose within 30 minutes.   Refill the container with at least 16 ounces of clear liquid. Again, take your time and slowly finish all of it within 30 minutes. Continue to drink additional clear liquids throughout the evening.   Dose 2: 4 hours before procedure:  Use the mixing container to mix the contents of Dose 2 (Pouch A and pouch B) with at least 16 ounces of water by shaking or using a spoon until it's completely dissolved. This make take up to the 2 to 3 minutes. Take your time- slowly finish the dose within 30 minutes.   Refill the container with at least 16 ounces of clear liquid. Again, take your time and slowly finish all of it within 30 minutes. Continue to drink additional clear liquids but be  sure to stop drinking liquids at least 3 hours before your colonoscopy.      MIRALAX-GATORADE PREP:    Ingredients to buy (no prescription required, all available over-the-counter):   1. Gatorade: Two 32 ounce bottles  -- Any color except red, purple, or [place].  -- Can substitute with Powerade, Pedialyte, Vitamin Water, Coconut Water.  -- Recommend refrigeration.  2. Bisacodyl: Two 90m tablets (Dulcolax Laxative NOT Dulcolax Stool Softener)   3. MiraLAX: 238 gram (8.3 ounces) bottle  -- Can substitute with generic PEG-3350.    Instructions:    Day before colonoscopy:   -- Clear Liquid Diet only  -- 12PM (noon): Take two Bisacodyl 5104mtablets    Dose 1: 5-8PM day before procedure  -- In a large pitcher, mix the 238g bottle of MiraLAX with two 32 ounce bottles of Gatorade. Stir to dissolve the MiraLAX.  -- Drink 8 ounces every 20 minutes until the first half of the solution is finished (must finish the first half of the solution within 2 hours).    Day of colonoscopy:   -- Continue Clear Liquid Diet    Dose 2: 4 hours before procedure  -- Drink 8 ounces every 20 minutes until the second half of the solution is finished.    -- 3 hours before your procedure, do not drink anything.               [place] DaRosana Hoes          [place] DaRosana Hoes  [place] Rosana Ho[place]               [place]     Main GI Lab          [facility]      [facility]     [facility]    [facility]    [address]             GI Lab            [address] [place] [address],            [place]                      Drive               for Health     [address] [place]               [address], [place]  [place], [place], [place], [place]              [place]          [address]                    [place]              [phone]       [address]  [phone]       [phone]     [phone]                                    [place], [place]  [address]                                                                                                                      [phone]

## 2020-10-24 NOTE — Progress Notes (Signed)
Patient is in room #11    Medication list given at the time of check in for review by patient, instructed patient to indicate to the doctor any corrections that need to be made for Medication Reconciliation.    Patient identifiers obtained. vital signs taken, screened for pain, allergies checked, patient roomed, and Bao Sean H Nguyen, MD notified.      , LVN

## 2020-10-24 NOTE — Progress Notes (Signed)
Gynecology Clinic Visit    Date: 10/24/2020    CC: adnexal mass    History of Present Illness: Patient is a 56yrfemale G1P1 who presents for consultation regarding adnexal mass.  Patient's last menstrual period was 05/06/1983.  She is sexually active with a female partner as her partner is unable.      Has had abdominal pain for at least a year.  Pain is located on the lower left side.  Pain is sharp, feels a constant discomfort but the pain is only present with palpation of the left side.  Has some pain that comes out onto the lower mid-abdomen.  Bowel movements radiate between constipation and diarrhea.  Has not noted any triggers, pain might be worse with constipation. No dysuria.  No vaginal bleeding, discharge.  Endorses urinary incontinence.  Has lost 20-30lb this last year, has made dietary changes.  No fevers/chills.    H/o carcinoid tumor, removed endoscopically, no further treatment.    H/o hysterectomy, open, states it was done for pain.  Does not recall if they found endometriosis or not.      Cannot get an MRI due to hardware in her ear.    Review Of Systems: All systems were reviewed and all systems are negative except hot flashes, mood swings, trouble sleeping, nausea, constipation, diarrhea, palpitations, cough, dizziness, vaginal itching, depression (situational), feeling cold, feeling hot, hard of hearing    I reviewed the patient's past medical and family/social history, and updated EMR.    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    Psychiatric illness      Past Surgical History:   Past Surgical History:    Procedure Laterality Date    BIOFEEDBACK PERI/URO/RECTAL  10/21/2017    session #1: N Score = 5.4    BIOFEEDBACK PERI/URO/RECTAL  11/04/2017    session #2    BIOFEEDBACK PERI/URO/RECTAL  11/18/2017    Session #3    BIOFEEDBACK PERI/URO/RECTAL  12/02/2017    Session #4    BIOFEEDBACK PERI/URO/RECTAL  12/30/2017    Session #5    BIOFEEDBACK PERI/URO/RECTAL  03/23/2018    Session #6    CAPSULE ENDOSCOPY  01/27/2018    CAPSULE ENDOSCOPY  01/29/2018         COLONOSCOPY  01/04/2014    polyp--TA and erosion; hemorrhoids; tics; repeat x 3 yrs    COLONOSCOPY  12/16/2017    TA; repeat in 3 yrs    EGD  04/2020    gastritis    EGD  04/08/2019    biopsy path--pending    HC BIOFEEDBACK PERI/URO/RECTAL  05/02/2018         HC COLONOSCOPY,REMV LESN,SNARE  12/16/2017    MAMMOPLASTY, REDUCTION  1985    MANOMETRY  09/02/2017    work on constipation; kegels and biofeedback; proceed w/colonoscopy     PHACOEMULSIFICATION, CATARACT Right 02/12/2015    with IOL    PR ESOPHAGOGASTRODUODENOSCOPY TRANSORAL DIAGNOSTIC  02/12/2016    EGD--polyp--path pending; antral gastritis; no varices     PR ESOPHAGOGASTRODUODENOSCOPY TRANSORAL DIAGNOSTIC  06/11/2016    EGD--biopsy at site of carcinoid tumor path pending     PR ESOPHAGOGASTRODUODENOSCOPY TRANSORAL DIAGNOSTIC      EGD--folds in duodenum--path pending     PR ESOPHAGOGASTRODUODENOSCOPY TRANSORAL DIAGNOSTIC  04/2017    EGD; repeat EGD in 1 year     PR ESOPHAGOGASTRODUODENOSCOPY TRANSORAL DIAGNOSTIC  04/20/2018    gastritis; nodule--path pending    PR KNEE SCOPE,DIAGNOSTIC  1980s    Knee arthroscopy    PR LIGATE FALLOPIAN TUBE      Tubal ligation    PR REPAIR TYMPANIC MEMBRANE      Tympanoplasty    PR TOTAL ABDOM HYSTERECTOMY  1980s    partial; no BSO    REPAIR, BLEPHAROPTOSIS Bilateral 08/06/2015    Blepharoplasty bilateral upper lids     OB/GYN History:   OB History   Gravida Para Term Preterm AB Living   1 1       1    SAB IAB Ectopic Multiple Live Births                   # Outcome Date GA Lbr Len/2nd Weight Sex Delivery Anes PTL Lv   1 Para              Family History:   Family History   Problem Relation Name Age of Onset    Diabetes Father      Heart Mother          MI at 82s     Non-contributory Brother      Non-contributory Brother       Social History:   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    Tobacco Use    Smoking status: Former Smoker     Packs/day: 0.10     Years: 20.00     Pack years: 2.00    Smokeless tobacco: Never Used    Tobacco comment: quit 30 years ago   Substance and Sexual Activity    Alcohol use: Yes     Alcohol/week: 0.0 standard drinks     Comment: rare    Drug use: No    Sexual activity: Yes     Partners: Male   Other Topics Concern    Not on file   Social History Narrative    Not on file     Social Determinants of Health     Financial Resource Strain: Not on file   Food Insecurity: Not on file   Transportation Needs: Not on file   Physical Activity: Not on file   Stress: Not on file   Social Connections: Not on file   Intimate Partner Violence: Not on file   Housing Stability: Not on file     Medications:   No outpatient medications have been marked as taking for the 10/24/20 encounter (Office Visit) with Alfredo Bach, MD.     Allergies: Hydrochlorothiazide, Januvia [sitagliptin], Keflex [cephalexin], Lyrica [pregabalin], Omnipaque [iohexol], Prozac [fluoxetine hcl], Sulfa (sulfonamide antibiotics), and Topiramate    Physical Exam:   Vital Signs:   Vitals:  10/24/20 0809 10/24/20 0810   BP: 145/81 145/83   SITE: right arm right arm   Orthostatic Position: sitting sitting   Cuff Size: regular regular   Pulse: 68    SpO2: 98%    Weight: 71.4 kg (157 lb 6.5 oz)      General Appearance: healthy, alert,  no distress, pleasant affect, cooperative.  Abdomen: soft, tender on the left from the lower quadrant to the mid quadrant laterally  Pelvic: exam was chaperoned by Brent General, LVN  Vulva: atrophic, otherwise normal  Urethral meatus: normal in appearance  Urethra: nontender, normal  Bladder base: nontender, normal  Vagina: normal, atrophic  Cervix: absent  Uterus: absent  Adnexa: no adnexal masses appreciated, pain in the region of the left adnexa, unable to palpate  Perineum: normal in appearance  Levator ani: tender bilaterally, unable to state if pain reproduced, L>R    Screening:  MMG:  08/08/20    Labs:  10/08/20  CA 125 7.9      Imaging:  Pelvic US:  09/19/20  FINDINGS:  The uterus is surgically absent.  The ovaries only visualized on the transabdominal portion of the exam. The  right ovary measures 2.7 x 1.9 x 1.8 cm and appears unremarkable.    The left ovary measures 4.3 x 3.3 x 3.6 cm with a volume of 27 cc and  demonstrates a complex cystic appearance. Vascular flow is noted in the  periphery.      IMPRESSION:  1. The uterus is surgically absent.  2. The right ovary is unremarkable.  3. The left ovary demonstrates a complex cystic appearance and not well  evaluated due to inability to visualize on the endovaginal portion of the  exam. Recommend further evaluation with contrast-enhanced pelvic MRI.      Pelvic US 06/07/19    FINDINGS:  Uterus: Status post hysterectomy.  Right Ovary: 1.8 x 1 x 1.4 cm. Unremarkable  Left Ovary: Not visualized, no adnexal mass.    Free fluid: None.    IMPRESSION:  1. Right ovary is unremarkable. Left ovary not visualized.    CT abd/pelvis 08/22/20  FINDINGS:  Lower Chest: Unremarkable.  Liver: A few scattered small hypodensities too small to characterize. Mild  liver surface nodularity.  Bile Ducts: Unremarkable.  Gallbladder: Unremarkable.  Pancreas: Unremarkable.  Spleen: Unremarkable.  Adrenal Glands: Unremarkable.  Kidneys: Decreased size of left posterior renal cyst  with some peripheral  calcifications.  GI Tract: Colonic diverticulosis without diverticulitis. Small soft tissue  nodule anterior to the descending colon previously had fat density and  therefore likely represents an small area of fat process or old torsed  epiploic appendage. Normal appendix.  Peritoneal Cavity: No free fluid or free air.  Bladder: Unremarkable.  Uterus and Ovaries: Status post hysterectomy. This 3.0 cm cystic lesion of  the left adnexa that is either new or increased from the prior. It does not  have simple fluid attenuation.  Lymph Nodes: Prominent upper abdominal lymph nodes are slightly increased  including a 1.7 lymph node anterior to the hepatic artery, previously 1.2  cm  Major Vascular Structures: There is atherosclerosis of the aorta and branch  vessels.  Soft Tissues: Unremarkable.  Musculoskeletal: Unremarkable.    IMPRESSION:  1. Diverticulosis without diverticulitis.  2. Increasing/new 3.0 cm nonsimple fluid cystic lesion of the left  adnexa. Recommend further evaluation with pelvic ultrasound.  3. Liver surface nodularity suggesting fibrosis.  4. Slight increased upper abdominal mild lymphadenopathy measuring up  to 1.7  cm is nonspecific. The slow growth argues against an aggressive  process. If patient proves to have chronic liver disease, mild upper  abdominal lymphadenopathy is often associated with this.      CT abd/pelvis 04/08/17  FINDINGS:  URINARY COLLECTING SYSTEM:  Renal stones: None.  Ureteral stones: None.  Bladder stones: None.    Solid / enhancing renal mass: None. Left interpolar cyst with thin  peripheral calcification. Additional subcentimeter hypodensities, too small  to characterize but statistically likely benign.  Renal enhancement: Normal symmetric enhancement of the kidneys.    There is segmental nonopacification of the bilateral distal ureters.    Hydronephrosis: None.  Hydroureter: None.  Renal collecting system: No intraluminal filling  defects.    Ureters: No intraluminal filling defects in the opacified segments of the  ureters. Segments of the ureter that are not opacified do not show any wall  thickening, suspicion of an enhancing mass, or focal or segmental  dilatation.    Urinary bladder: No focal or asymmetric bladder wall thickening, focal  filling defects, or enhancing polypoid lesions.    REMAINING ABDOMEN AND PELVIS:  Lower Chest: Heavy coronary artery calcification. Bibasilar atelectasis.  Liver: Unchanged hepatic steatosis and hepatomegaly.  Bile Ducts: Unremarkable.  Gallbladder: Unremarkable.  Pancreas: Unremarkable.  Spleen: Unremarkable.  Adrenal Glands: Unchanged 1.8 cm left adenoma.  GI Tract: Diverticulosis without evidence of acute diverticulitis.  Peritoneal Cavity: No free fluid or free air.  Uterus and Ovaries: Status post hysterectomy. No adnexal masses.  Lymph Nodes: Unchanged 1.3 cm porta hepatic lymph node. Unchanged  additional smaller upper abdominal lymph nodes.  Major Vascular Structures: Moderate aortoiliac artery calcification.  Soft Tissues: Unchanged partially calcified left gluteal soft tissue  densities, likely injection granulomas.  Musculoskeletal: No suspicious bony lesions or acute fractures. Mild  multilevel degenerative changes of the visualized spine.    IMPRESSION:  1. No cause for hematuria. No urolithiasis, renal mass, or  uroepithelial mass in the kidneys, ureters, or urinary bladder.  2. No hydronephrosis or hydroureter.  3. Unchanged 1.8 cm left adrenal adenoma.      CT abd/pelvis 12/28/15  ABDOMEN AND PELVIS  Liver: Unremarkable appearance of the liver with inability to assess for  hepatic steatosis on CT. A few subcentimeter hypoattenuating lesions in  right hepatic dome are too small to characterize.  Gallbladder: Unremarkable.  Pancreas: Unremarkable.  Spleen: Unremarkable.  Adrenals: Right adrenal gland is unremarkable. Left adrenal gland nodule  measuring up to 1.4 cm, statistically likely  adenoma.  Kidneys: 1.7 cm partially exophytic cyst within midpole of left kidney.  Additional hypodensities are too small to characterize. No hydronephrosis.  Bladder: Decompressed and poorly evaluated.  Reproductive Organs: Status post hysterectomy. No adnexal masses.  Bowel: Colonic diverticulosis without adjacent inflammatory change. No  abnormal bowel wall thickening or dilatation.  Nodes: Mildly prominent, nonspecific porta hepatis lymph nodes measuring up  to 12 mm.  Vessels: Mild aortoiliac atherosclerosis.  Other: No free fluid.    BONES AND SUBCUTANEOUS SOFT TISSUES  Soft tissue: Unremarkable.  Bones: Mild degenerative changes. No acute bony abnormality.    IMPRESSION:  1. No acute intra-abdominal process.    2. No definite CT evidence of liver disease with inability to assess  for hepatic steatosis on CT. Given discrepancy with previous ultrasound  study of 10/31/2015 recommend dedicated MRI elastography for further  evaluation of fibrosis.    3. A 1.4 cm left adrenal nodule is indeterminate but statistically  highly likely benign if the patient has  no known malignancy. Consider  follow up with adrenal protocol CT in 12 months. Hormonal evaluation is  recommended. Consider endocrinology referral.    Assesment and Plan:  Patient is a 55yrfemale here for left sided adnexal mass    (N94.89) Adnexal mass  (primary encounter diagnosis)  Comment: on review of prior imaging, was not noted on CT scan prior to 2022, however the last CT scan was in 2018.  Prior pelvic UKoreadid not visualize the left ovary.  CA 125 is WNL.  Discussed CA 19-9 and CEA.  Plan repeat UKoreain 2w, if enlarging or changing, will need to consider surgery.  She has many medical issues and a h/o hysterectomy, all of which make a surgery more risky for her.  Plan: Ca 19-9, Carcino Embryonic Antigen, UKoreaPELVIS,         TRANSVAG + UKoreaPELVIS, TRANSABD, NON-OB,         COMPLETE          (G89.3) Neoplasm related pain (acute) (chronic)   ((I97.847  Levator spasm  Comment: unclear if pain related to the mass, however it is in the region of the left adnexal mass.  Will obtain tumor markers.  Consideration given to levator spasm and/or constipation as well.  Plan: Ca 19-9, Carcino Embryonic Antigen    The patient  verbalized her understanding of our discussion, and the next steps in her care.  I spent 30 minutes with the patient, 15 minutes of which were spent counseling her regarding findings, plan of care, she acknowledged and understood this counseling.    Electronically signed by:  JBruce Donath PGerarda Fraction [place]

## 2020-10-24 NOTE — Progress Notes (Signed)
PPE Used During the Visit:    Eye and Face Protection:      [x]Glasses/Goggles                [ x]N-95 Mask          [ ]Particulate Respirator   [ ]Half/Full Face Elastomeric Respirator   [ ]PAPR  The patient is wearing a MASK.   M.Kenzy Campoverde Sr. LVN

## 2020-10-26 ENCOUNTER — Ambulatory Visit (INDEPENDENT_AMBULATORY_CARE_PROVIDER_SITE_OTHER): Payer: Medicare Other

## 2020-10-26 ENCOUNTER — Ambulatory Visit: Payer: Medicare Other | Attending: Obstetrics & Gynecology

## 2020-10-26 DIAGNOSIS — G893 Neoplasm related pain (acute) (chronic): Secondary | ICD-10-CM

## 2020-10-26 DIAGNOSIS — Z5329 Procedure and treatment not carried out because of patient's decision for other reasons: Secondary | ICD-10-CM

## 2020-10-26 DIAGNOSIS — N9489 Other specified conditions associated with female genital organs and menstrual cycle: Secondary | ICD-10-CM

## 2020-10-26 NOTE — Nursing Note (Signed)
Patient came in to upload pics of her right lower leg, area is red, itchy and scaly. Patient has appt with Saralyn Pilar in Sept, patient wanted Dr Lanora Manis to review.     Informed front office staff to book a quick 15 min appt with Saralyn Pilar next available.     Dr Lanora Manis, please review the pic and advise.

## 2020-10-29 ENCOUNTER — Other Ambulatory Visit: Payer: Self-pay | Admitting: Dermatology

## 2020-10-29 ENCOUNTER — Encounter: Payer: Self-pay | Admitting: Dermatology

## 2020-10-29 MED ORDER — BETAMETHASONE, AUGMENTED 0.05 % TOPICAL CREAM
TOPICAL_CREAM | TOPICAL | 0 refills | Status: DC
Start: 2020-10-29 — End: 2021-01-29

## 2020-10-29 NOTE — Telephone Encounter (Signed)
Patient was placed on Nurse Visit on Fri am, as she walked in Fri am to see you, picture of leg was taken. FYI

## 2020-10-30 LAB — CARCINO EMBRYONIC ANTIGEN: Carcino Embryonic Antigen: 1.5 ng/mL (ref <5.0)

## 2020-10-30 LAB — CA 19-9: CA 19-9: 7 U/mL (ref <=37.0)

## 2020-11-07 ENCOUNTER — Ambulatory Visit
Admission: RE | Admit: 2020-11-07 | Discharge: 2020-11-07 | Disposition: A | Discharge status: Home / Self Care | Payer: Medicare Other | Source: Ambulatory Visit | Attending: Obstetrics & Gynecology | Admitting: Obstetrics & Gynecology

## 2020-11-07 DIAGNOSIS — N83292 Other ovarian cyst, left side: Secondary | ICD-10-CM

## 2020-11-07 DIAGNOSIS — N9489 Other specified conditions associated with female genital organs and menstrual cycle: Secondary | ICD-10-CM

## 2020-11-07 DIAGNOSIS — N838 Other noninflammatory disorders of ovary, fallopian tube and broad ligament: Secondary | ICD-10-CM

## 2020-11-08 ENCOUNTER — Encounter: Payer: Self-pay | Admitting: Obstetrics & Gynecology

## 2020-11-09 ENCOUNTER — Encounter: Payer: Self-pay | Admitting: Obstetrics & Gynecology

## 2020-11-09 NOTE — Telephone Encounter (Signed)
From: [name]  To: Leta Speller, MD  Sent: 11/08/2020 2:11 PM PDT  Subject: Question regarding US PELVIS, ENDOVAGINAL + US PELVIS, TRANSVAGINAL, NON-OB, COMPLETE    Hi Dr. About my ears.Marland KitchenMarland KitchenI had a stapendectomy in each ear about 45 years ago at [facility]. The surgery removed the stapes bone in each ear and replaced it with some type of wire to assist with conducting sound. Dr. Ferrel Logan at [facility] has taken care of my ears/hearing for several years now and may be able to provide additional information. He has said that the ear tissue has grown around the wire decreasing it functioning. I am fine having a head CT if you feel that is needed. I have an appointment to see you on July 13th, should I keep that? Does more test or surgery now make the most sense?

## 2020-11-12 ENCOUNTER — Encounter: Payer: Self-pay | Admitting: Obstetrics & Gynecology

## 2020-11-12 NOTE — Addendum Note (Signed)
Addended by: Darleene Cleaver on: 11/12/2020 09:01 AM     Modules accepted: Orders

## 2020-11-14 ENCOUNTER — Ambulatory Visit: Payer: Medicare Other

## 2020-11-15 ENCOUNTER — Ambulatory Visit: Payer: Medicare Other | Admitting: OBSTETRICS/GYN

## 2020-11-15 ENCOUNTER — Encounter: Payer: Self-pay | Admitting: OBSTETRICS/GYN

## 2020-11-15 VITALS — BP 152/93 | HR 62 | Wt 157.0 lb

## 2020-11-15 DIAGNOSIS — N83202 Unspecified ovarian cyst, left side: Secondary | ICD-10-CM

## 2020-11-15 NOTE — Progress Notes (Signed)
PPE Used During the Visit:    Eye and Face Protection:       [ x]Surgical Mask or                 [ ] Particulate Respirator   [ ] Half/Full Face Elastomeric Respirator   [ ] PAPR  The patient is wearing a MASK.   M.Lan Entsminger Sr. LVN

## 2020-11-15 NOTE — Patient Instructions (Addendum)
[phone] Radiology in [place] schedule in 2-3 months

## 2020-11-20 NOTE — Progress Notes (Signed)
[facility] Appointment  11/15/2020    ID: [id number] to gyn clinic for follow up imaging    HPI: see prior notes from Dr. PGerarda Fraction Essentially noted to have Ovarian complex cyst on CT, UKoreafollow up was concerning for possible maliganncy, tumor markers normal and here to discuss follow up UKoreareport and plan of care. She is feeling well, some occasional LLQ pain, but not severe (hx of diverticulosis as well).  She also has a ear surgery with possible metal so an MRI is unknown to be compatible.      PMH, PSH, Meds, All reviewed and updated in EMR.    PE:   BP (!) 152/93 (SITE: right arm, Orthostatic Position: sitting, Cuff Size: regular)   Pulse 62   Wt 71.2 kg (156 lb 15.5 oz)   LMP 05/06/1983   SpO2 99%   BMI 28.71 kg/m   General Appearance:  WDWN, NAD  Psychiatric: Normal mood and affect  Neurologic:  Normal orientation    Pelvic UKorea7/6/22:   ---------- ADDENDUM #1 ----------  On further review of the images with my colleagues in radiology, this left  ovarian lesion is less concerning than on the 09/19/20 ultrasound. Instead  of recommending MRI, we feel that pelvic ultrasound follow-up in 3-6 months  would be appropriate at this time.     The above was relayed to Dr. PGerarda Fractionvia secure messaging system on 11/09/20.    Final Report Electronically Signed By: Dr. BEvette Georges MD, M[place]  11/09/2020 10:21 AM  Signed by CKaylyn Lim MD on 11/09/2020 10:21  Narrative & Impression  UKoreaPELVIS, TRANSVAG + UKoreaPELVIS, TRANSABD, NON-OB, COMPLETE  EXAM DATE: 11/07/2020 11:53 AM  COMPARISON: 09/19/2020    INDICATION: Signs/Symptoms:  Left adnexal mass, please compare to prior  ultrasound, please attempt endovaginal imaging of ovary  N94.89 Other specified conditions associated with female genital organs and  menstrual cycle      TECHNIQUE: Real-time transabdominal ultrasound scanning of the female  pelvis was performed by the sonographer. Endovaginal ultrasound was  performed to obtain better imaging of the  pelvic organs. Representative  static images and video clips are submitted for review.     FINDINGS:  IMAGES PERSONALLY REVIEWED AND INTERPRETTED BY ME  Uterus: Surgically absent.     Right Ovary: 2.2 x 2.0 x 1.8 cm for volume of 4.1 mL. Normal flow. Only  visualized on transabdominal ultrasound only.     Left Ovary: 3.0 x 4.3 x 2.7 cm for a volume of 18 mL (previously 27 mL).  Normal flow. 2.5 x 3.1 x 1.7 cm unilocular cystic lesion without solid  component. Mild irregularity of the inner wall. Ovary visualized on  transabdominal ultrasound only.    Free fluid: None.    IMPRESSION:  1. Compared to 09/19/2020, similar visualization of the ovaries on  transabdominal ultrasound only.  2. Indeterminate left ovarian 3.1 cm unilocular cystic lesion with mild  irregularity of the inner wall and no solid component. Given inability to  visualize lesion on endovaginal ultrasound, again suggest pelvic MRI for  further evaluation. EMR notes refer to patient having hardware in her ear  that is MRI incompatible. Suggest contacting the radiology department to  confirm MR incompatibility.    Assessment/Plan:  89yremale with left ovarian cyst, imaging reports mixed but including my personal interpretation of most recent USKoreavarian cyst appears to be simple and likely benign, no need for surgical treatment at this time. We discussed  that [place] is dependent on the technician and technical factors and the best looking image looks like a simple cyst which has a lower risk of malignancy. Agree with plan for repeating US in 2-3 months for close follow up, if no change then can spase out imaging follow up. She expressed udnerstanding    Total length of visit 25 minutes with >50% of time in face to face counseling    Paula Daugherty, M.D.  Associate Clinical Professor  [facility]  Department of Obstetrics & Gynecology  [facility] [place]

## 2020-11-22 ENCOUNTER — Encounter: Payer: Self-pay | Admitting: OBSTETRICS/GYN

## 2020-11-22 NOTE — Telephone Encounter (Signed)
From: [name]  To: Alveda Reasons, MD  Sent: 11/21/2020 3:19 PM PDT  Subject: Ultrasound request    Hello, when I saw you on July 14th you asked me to schedule a follow-up for 3 months along with getting another ultrasound a week or so before that follow-up appointment.    I tried to make an appointment for the requested ultrasound however I was not able to do that. I was able to schedule my next follow up GYN appointment for September 23rd but radiology clinic says that the ultrasound order is not ordered to be done until October 11. They shared that you or Dr. Gerarda Fraction will need to change the order date if you want it done before next appointment. Sorry for the confusion hopefully this note will make some sense. Thank you in advance for your help.

## 2020-11-23 NOTE — Telephone Encounter (Signed)
Spoke with the patient the [place] should be done on or after 10/11 then she can schedule the follow up with phelps informed the patient and she will sacto and schedule this and phone for the appt after.patinet with understanding

## 2020-11-23 NOTE — Telephone Encounter (Signed)
General Advice / Message to MD:    Requesting a appointment change from 02/18/2021 to 02/19/2021. Patient is scheduled with Dr. Gerarda Fraction on 02/18/2021 and was wondering if can change to 02/19/2021.      Please contact patient if can be done at [phone].            [place], [place]  [name]

## 2020-11-23 NOTE — Telephone Encounter (Signed)
Patient scheduled for MD Paula Daugherty for follow up on 02/18/21. No questions asked.    [place]  [facility]

## 2020-11-27 NOTE — Telephone Encounter (Signed)
conf with patient no openings on 10/18 she will keep her appt on 02/18/21.

## 2020-12-05 NOTE — Progress Notes (Deleted)
Visit Date: 12/07/2020  Last seen 01/30/2021 with Dr. Lanora Manis    First visit for Paula Daugherty (DOB: Jul 26, 1945), a 75yrold female.      This patient is accompanied in the office by her husband    Chief complaint:  Lesion on the leg     History obtained from: Patient    Assessment/ Plan     # Skin lesion of the left leg - by history  HPI: The patient is concerned about a lesions on the left leg.  It was there for months. When it first appeared, the lesion was raised,scaly and itchy. The patient applied Betamethasone 0.05% cream twice daily for 2 weeks (prescribed by Dr. HLanora Manis with improvement. However, about several weeks ago, the lesion resolved and not since returned.   Exam: No primary lesions seen on exam.   Plan:    - The lesion has resolved and not recurred.  Clinically, there is no rash on skin today.  The differential diagnosis includes inflamed seborrheic keratoses versus actinic keratoses contact/irritant dermatitis.  No treatment was provided today since the lesion is not present.    - If the rash recurs, she was asked to take a picture and upload the image to MyChart or to call the office.       # Seborrheic keratoses - no treatment.  HPI: Asymptomatic lesions on the scalp and back x months.There has been no history of treatment  Exam:   Brown verrucous stuck-on papules of the left temporal scalp and back  Plan:   - Patient reassured of the benign nature of the lesion. No treatment is indicated at this time.  - Patient informed that removal is a cosmetic procedure.  Patient understands that this is an out-of-pocket expense not covered by insurance.      Return Visit: 1-2 years for FBSE         Problem List:     Skin Cancer History:  None     Family History:  Negative for Melanoma     Patient Active Problem List    Diagnosis Date Noted    Adrenal nodule ([facility] 12/29/2015     Priority: High     Class: Acute     left      NASH (nonalcoholic steatohepatitis)  08/10/2018    Mild episode of recurrent major depressive disorder ([facility] 05/12/2018    Angina pectoris ([facility] [place] 09/23/2017    Subcutaneous nodule 07/14/2017    Asymptomatic microscopic hematuria 03/08/2017     7 RBCs; cs consistent with normal flora 03/08/2017       Abdominal pain, generalized 12/07/2016    Nausea without vomiting 12/07/2016    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/20/2016    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[facility] 04/17/2016    Medication Therapy Auth 03/19/2016     PA for Ondansetron (ZOFRAN ODT) 8 mg disintegrating tablet to [facility] ID# [id number] Approved until 04/18/16  PA submitted for Ondansetron (ZOFRAN, AS HYDROCHLORIDE,) 8 mg Tablet to [facility] ID# [id number]- Approved utnil 06/08/17  PA for Omeprazole (PRILOSEC) 20 mg Delayed Release Capsule  60/month to [facility] ID# [id number] until 02/02/2022  04/06/2020 PA submitted for Ondansetron (ZOFRAN-ODT) 4 mg disintegrating tablet 50/17 DS to [facility] ID# [id number] EPA- approved through 05/07/2020.      Liver fibrosis 12/12/2015    Ptosis of eyelid 08/06/2015    Cataract 12/18/2014  History of actinic keratoses 12/06/2014    Macroalbuminuric diabetic nephropathy ([place]) 09/12/2014    Fatty liver 08/28/2014     Possible portal HTN--hepatology referral placed 08/28/2014.       Cough 12/14/2013    Renal cyst ( 6.4 cm cyst left kidney) 08/24/2013    Hyperlipidemia with target LDL less than 70 07/28/2013    Vitamin D insufficiency 07/28/2013    Abnormal LFTs 07/28/2013    DM2 (diabetes mellitus, type 2) ([place]) 07/11/2013     10/15 Diabetes- Dining with Diabetes: Basics  03/10/2014 Diabetes - Recharge Workshop      Depression with anxiety 07/11/2013    Stress at home 07/11/2013    Leg cramps-right calf 07/11/2013    Right ear pain 07/11/2013    Fibromyalgia 07/11/2013    HTN (hypertension) 07/11/2013    GERD (gastroesophageal  reflux disease) 07/11/2013        Meds:  List in EMR viewed today    Allergies:    List in EMR viewed today    Labs Reviewed:  None    Records Reviewed:   None    Areas Examined:  problem-focused and scalp    Patient Protective Equipment (PPE) Used:  Patient WAS wearing a surgical mask and removed with facial exam   Contact precautions were followed when caring for the patient.   PPE used by provider during encounter: Surgical mask and face shield/ goggles     SCRIBE STATEMENT  I, [place],  am personally taking down the notes in the presence of Beatris Si, PA-C  Electronically signed by Patria Mane, [place], 12/07/2020  10:54 AM      SCRIBE Marica Otter  I, Beatris Si, PA-C, personally performed the services described in this documentation, as scribed by the trained medical scribe above in my presence, and it is both accurate and complete.     Beatris Si, PA-C  Physician Assistant, Department of Dermatology  Electronically signed by Beatris Si, PA-C  Supervising attending: Gerlean Ren, MD     Barriers to Learning assessed: none.  Patient verbalizes understanding of teaching and instructions.  The patient was instructed in all aspects of care and expressed comprehension.      -----------------------------------------------------------------------------  If you are reviewing this progress note and have questions about the meaning or medical terms being used, please schedule an appointment or send a message via the [place] system.  Medical notes are a communication tool between medical professionals and require medical terms to be used for efficiency. You can also look up terms via on-line medical dictionaries such as Medline Plus at D[employer]end[employer].pl. If you have any further questions about the medical records, please contact [facility] at [phone] or by e-mail at  [email or web address].

## 2020-12-07 ENCOUNTER — Ambulatory Visit: Payer: Medicare Other | Admitting: Physician Assistant

## 2020-12-07 ENCOUNTER — Encounter: Payer: Self-pay | Admitting: Physician Assistant

## 2020-12-07 DIAGNOSIS — L821 Other seborrheic keratosis: Secondary | ICD-10-CM

## 2020-12-07 NOTE — Nursing Note (Signed)
Patient was screened for allergies, tobacco usage and pain level, but no vitals taken per clinician's request.         PPE Used During the Visit:  Eye and Face Protection: Body Protection: Hand Protection:   [x]Glasses/Goggles                                    [x]Face Shield  [x]Surgical Mask                          []N-95 Mask                           []Particulate Respirator  []Half/Full Face Elastomeric Respirator  []PAPR                  []Gown []Gloves

## 2020-12-07 NOTE — Progress Notes (Addendum)
Visit Date: 12/07/2020  Last seen 01/31/2020 with Dr. Lanora Manis    First visit for Paula Daugherty (DOB: 06-07-1945), a 75yrold female.      This patient is accompanied in the office by her husband    Chief complaint:  Lesion on the leg     History obtained from: Patient    Assessment/ Plan     # Skin lesion of the left leg - by history  HPI: The patient is concerned about a lesions on the left leg.  It was there for months. When it first appeared, the lesion was raised,scaly and itchy. The patient applied Betamethasone 0.05% cream twice daily for 2 weeks (prescribed by Dr. HLanora Manis with improvement. However, about several weeks ago, the lesion resolved and not since returned.   Exam: No primary lesions seen on exam.   Plan:    - The lesion has resolved and not recurred.  Clinically, there is no rash on skin today.    - If the rash recurs, she was asked to take a picture and upload the image to MyChart or to call the office.       # Seborrheic keratoses - no treatment.  HPI: Asymptomatic lesions on the scalp and back x months.There has been no history of treatment  Exam:   Brown verrucous stuck-on papules of the left temporal scalp and back  Plan:   - Patient reassured of the benign nature of the lesion. No treatment is indicated at this time.  - Patient informed that removal is a cosmetic procedure.  Patient understands that this is an out-of-pocket expense not covered by insurance.      Return Visit: at patient's convenience for a FBSE        Problem List:     Skin Cancer History:  None     Family History:  Negative for Melanoma     Patient Active Problem List    Diagnosis Date Noted    Adrenal nodule ([facility] 12/29/2015     Priority: High     Class: Acute     left      NASH (nonalcoholic steatohepatitis) 08/10/2018    Mild episode of recurrent major depressive disorder ([facility] 05/12/2018    Angina pectoris ([facility] 09/23/2017    Subcutaneous nodule 07/14/2017    Asymptomatic  microscopic hematuria 03/08/2017     7 RBCs; cs consistent with normal flora 03/08/2017       Abdominal pain, generalized 12/07/2016    Nausea without vomiting 12/07/2016    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/20/2016    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[place] 04/17/2016    Medication Therapy Auth 03/19/2016     PA for Ondansetron (ZOFRAN ODT) 8 mg disintegrating tablet to [facility] ID# [id number] Approved until 04/18/16  PA submitted for Ondansetron (ZOFRAN, AS HYDROCHLORIDE,) 8 mg Tablet to [facility] ID# [id number]- Approved utnil 06/08/17  PA for Omeprazole (PRILOSEC) 20 mg Delayed Release Capsule  60/month to [facility] ID# [id number] until 02/02/2022  04/06/2020 PA submitted for Ondansetron (ZOFRAN-ODT) 4 mg disintegrating tablet 50/17 DS to [facility] ID# [id number] EPA- approved through 05/07/2020.      Liver fibrosis 12/12/2015    Ptosis of eyelid 08/06/2015    Cataract 12/18/2014    History of actinic keratoses 12/06/2014    Macroalbuminuric diabetic nephropathy ([facility] 09/12/2014    Fatty liver 08/28/2014     Possible  portal HTN--hepatology referral placed 08/28/2014.       Cough 12/14/2013    Renal cyst ( 6.4 cm cyst left kidney) 08/24/2013    Hyperlipidemia with target LDL less than 70 07/28/2013    Vitamin D insufficiency 07/28/2013    Abnormal LFTs 07/28/2013    DM2 (diabetes mellitus, type 2) ([place]) 07/11/2013     10/15 Diabetes- Dining with Diabetes: Basics  03/10/2014 Diabetes - Recharge Workshop      Depression with anxiety 07/11/2013    Stress at home 07/11/2013    Leg cramps-right calf 07/11/2013    Right ear pain 07/11/2013    Fibromyalgia 07/11/2013    HTN (hypertension) 07/11/2013    GERD (gastroesophageal reflux disease) 07/11/2013        Meds:  List in EMR viewed today    Allergies:    List in EMR viewed today    Labs Reviewed:  None    Records Reviewed:   None    Areas Examined:  problem-focused and  scalp    Patient Protective Equipment (PPE) Used:  Patient WAS wearing a surgical mask and removed with facial exam   Contact precautions were followed when caring for the patient.   PPE used by provider during encounter: Surgical mask and face shield/ goggles     SCRIBE STATEMENT  I, [place],  am personally taking down the notes in the presence of Beatris Si, PA-C  Electronically signed by Patria Mane, [place], 12/07/2020  10:54 AM      SCRIBE Marica Otter  I, Beatris Si, PA-C, personally performed the services described in this documentation, as scribed by the trained medical scribe above in my presence, and it is both accurate and complete.     Beatris Si, PA-C  Physician Assistant, Department of Dermatology  Electronically signed by Beatris Si, PA-C  Supervising attending: Gerlean Ren, MD     Barriers to Learning assessed: none.  Patient verbalizes understanding of teaching and instructions.  The patient was instructed in all aspects of care and expressed comprehension.      -----------------------------------------------------------------------------  If you are reviewing this progress note and have questions about the meaning or medical terms being used, please schedule an appointment or send a message via the [place] system.  Medical notes are a communication tool between medical professionals and require medical terms to be used for efficiency. You can also look up terms via on-line medical dictionaries such as Medline Plus at DividendCut.pl. If you have any further questions about the medical records, please contact [facility] at [phone] or by e-mail at  [email or web address].

## 2020-12-14 ENCOUNTER — Ambulatory Visit: Payer: Medicare Other | Admitting: Physician Assistant

## 2020-12-20 ENCOUNTER — Encounter: Payer: Self-pay | Admitting: "Endocrinology

## 2020-12-20 NOTE — Telephone Encounter (Signed)
MyChart message sent to patient.

## 2020-12-20 NOTE — Telephone Encounter (Signed)
From: [name]  To: Minna Merritts, MD  Sent: 12/20/2020 9:46 AM PDT  Subject: Insulin dose change    Hello. At my last visit I asked when I could stop taking insulin. You said when I got to 10 units and my blood sugars numbers stayed tolerable. I have been at 10 units for close to a month. My numbers are running between 109 and 130 most of the time. The rare times it is higher I can relate it to something I have eaten. I haven't had crashes in awhile. My weight goes between 155 and 153 but I continue to work on getting it lower. I have 6 does left on my current pen. I would like to stop at the end of this pen IF you are ok with it. I will continue to monitor my sugars and go back on it if they start to climb. Look forward to your thoughts and advice. Take care.   Thank you for everything, Paula Daugherty

## 2020-12-24 ENCOUNTER — Other Ambulatory Visit: Payer: Self-pay | Admitting: Family Medicine

## 2020-12-24 ENCOUNTER — Other Ambulatory Visit: Payer: Self-pay | Admitting: GASTROENTEROLOGY

## 2020-12-25 ENCOUNTER — Ambulatory Visit: Payer: Self-pay | Admitting: Obstetrics & Gynecology

## 2020-12-25 NOTE — Telephone Encounter (Signed)
Last seen by PCP 08/14/2020.

## 2020-12-25 NOTE — Telephone Encounter (Signed)
Last Refill: 08/13/2020  LOV: 10/24/2020  NOV: 05/01/2021      Burman Freestone, LVN

## 2020-12-31 ENCOUNTER — Encounter: Payer: Self-pay | Admitting: GASTROENTEROLOGY

## 2020-12-31 ENCOUNTER — Ambulatory Visit: Payer: Vision Other Private Insurance | Admitting: Optometrist

## 2020-12-31 DIAGNOSIS — H524 Presbyopia: Secondary | ICD-10-CM

## 2020-12-31 DIAGNOSIS — H52203 Unspecified astigmatism, bilateral: Secondary | ICD-10-CM

## 2020-12-31 DIAGNOSIS — E119 Type 2 diabetes mellitus without complications: Secondary | ICD-10-CM

## 2020-12-31 NOTE — Telephone Encounter (Signed)
From: [name]  To: Georgeanne Nim, MD  Sent: 12/30/2020 10:28 PM PDT  Subject: Need Zofran    Hi Eric, hope all is well with you and your family. [facility] is telling me that they have reached out to you to refill my Zofran with no response. I know it is them not you but I still need the med. Would you please send a new Rx to [facility] for me maybe with more refills? TIA. See you in December, waiting for your schedule to open to make my appointment. Stay cool.

## 2020-12-31 NOTE — Progress Notes (Signed)
HPI       Diabetic Eye Exam              Comments: 75 yo old female here for diabetic eye exam cc: Pt feels her sugars are great, d/c her insulin recently still taking oral diabetic meds. Pt has noticed she has been having more headaches unsure if related to her glasses.            Last edited by Karn Cassis on 12/31/2020  9:43 AM.          Lab Results   Lab Name Value Date/Time    HGBA1C 6.3 (H) 10/11/2020 10:54 AM    HGBA1C 6.4 (H) 06/19/2020 10:30 AM    HGBA1C 6.7 (H) 02/23/2020 10:27 AM    HGBA1C 6.6 (H) 08/30/2015 08:35 AM    HGBA1C 7.7 (H) 03/23/2015 09:04 AM    HGBA1C 6.9 (H) 12/06/2014 11:45 AM       Patient Active Problem List   Diagnosis    DM2 (diabetes mellitus, type 2) ([place])    Depression with anxiety    Stress at home    Leg cramps-right calf    Right ear pain    Fibromyalgia    HTN (hypertension)    GERD (gastroesophageal reflux disease)    Hyperlipidemia with target LDL less than 70    Vitamin D insufficiency    Abnormal LFTs    Renal cyst ( 6.4 cm cyst left kidney)    Cough    Fatty liver    Macroalbuminuric diabetic nephropathy (HCC)    History of actinic keratoses    Cataract    Ptosis of eyelid    Liver fibrosis    Adrenal nodule (HCC)    Medication Therapy Auth    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HCC)    Mixed conductive and sensorineural hearing loss of both ears    Neoplasm of uncertain behavior of skin    Abdominal pain, generalized    Nausea without vomiting    Asymptomatic microscopic hematuria    Subcutaneous nodule    Angina pectoris (HCC)    Mild episode of recurrent major depressive disorder (HCC)    NASH (nonalcoholic steatohepatitis)       POH:  Status post blepharoplasty OU.   Status post CE OU    Assessment/Plan:     1. Presbyopia.  Mild astigmatism OU. Released a glasses prescription to the patient, if desired.     2. Status post cataract extraction OU. Stable.     3. 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  No evidence of diabetic retinopathy nor  clinically significant macular edema was present in either eye.  I emphasized the long-term risk to vision from diabetes and the importance of strict glycemic control, blood pressure control, along with regular eye exams to detect diabetic retinopathy at an early stage to prevent vision loss. She should return in one year for a dilated eye examination, or sooner if any changes are noticed.    4. Headaches. Not likely visual in nature. Recommend patient see primary care physician if persist.     Return to clinic in one year for a general eye examination or sooner if any changes are noticed.    Patient WAS wearing a mask during the exam.  Droplet precautions were taken for the patient.  PPE used by the provider: surgical mask    Amanda Pea [place], O.D, F.A.A.O.  Energy manager of  Ophthalmology

## 2020-12-31 NOTE — Telephone Encounter (Signed)
The following message was sent to the patient via MyChart:     Hi Paula Daugherty,    Your renewed prescription for Ondansetron (ZOFRAN-ODT) 4 mg disintegrating tablet was sent today to your [id number] - [place], [address], [phone] [facility] pharmacy.  Please follow up with your pharmacy for prescription status.    Thank you!    Sincerely,  [facility] manager

## 2020-12-31 NOTE — Telephone Encounter (Signed)
Medication refill request received and authorized for the following medication ZOFRAN prescribed by Dr. Virgel Bouquet     Last office visit: 10/24/20  Next office visit: 05/01/21    Last office visit and medications reviewed.  Per last office visit notes: "Instructions: continue reglan sparingly and zofran as needed"    Medication refilled per Prescription Refill by Clinic RN Standardized Procedure Policy.     Ronaldo Miyamoto, [facility] [place] RN

## 2021-01-09 ENCOUNTER — Other Ambulatory Visit: Payer: Self-pay | Admitting: GASTROENTEROLOGY

## 2021-01-09 MED ORDER — PEG 3350-ELECTROLYTES 236 GRAM-22.74 GRAM-6.74 GRAM-5.86 GRAM SOLUTION
ORAL | 0 refills | Status: DC
Start: 2021-01-09 — End: 2021-03-27

## 2021-01-14 NOTE — Progress Notes (Signed)
CDI Query   ADDITIONAL CLARIFICATION REQUESTED  Please indicate if any of the following conditions(s) coexisted and affected patient care, treatment, or management on 01/16/2021 during your face-to-face patient encounter.   Please enter your response(s) in the "ANSWER" space provided below.    CLINICAL FINDINGS:    1.  Chronic kidney disease stage 3a last addressed 09/21/19. GFR 52 on 10/08/20, if still relevant, can this diagnosis be addressed at next visit?  QUESTION:  Can the patient's coexisting condition be clarified or further specified as:    1.  Chronic kidney disease, stage 3a (GFR 45-59)  2.  Other explanation/diagnosis:   (Please document and include support)  3.  Clinically undetermined  4.  Not addressed at today's encounter                ANSWER:  1     RISK FACTORS:  75 years old, diabetes and hypertension    TREATMENT:  monitoring of labs. treatment of DM, HTN    (To close this note, please mark "Accept" then "Sign").    Respectfully,    Breawna Montenegro BSN, MHA, CRC, CDEO, [place]   Clinical Documentation Integrity: Outpatient  Health Information Management (HIM) Division Patient [place] [facility]  [email or web address]  C. [phone]

## 2021-01-16 ENCOUNTER — Encounter: Payer: Self-pay | Admitting: Family Medicine

## 2021-01-16 ENCOUNTER — Ambulatory Visit: Payer: Medicare Other | Admitting: Family Medicine

## 2021-01-16 ENCOUNTER — Telehealth: Payer: Self-pay | Admitting: Physician Assistant

## 2021-01-16 ENCOUNTER — Other Ambulatory Visit: Payer: Self-pay | Admitting: Family Medicine

## 2021-01-16 VITALS — BP 130/79 | HR 69 | Temp 97.7°F | Resp 12 | Wt 157.0 lb

## 2021-01-16 DIAGNOSIS — Z794 Long term (current) use of insulin: Secondary | ICD-10-CM

## 2021-01-16 DIAGNOSIS — E1122 Type 2 diabetes mellitus with diabetic chronic kidney disease: Secondary | ICD-10-CM

## 2021-01-16 DIAGNOSIS — R519 Headache, unspecified: Secondary | ICD-10-CM

## 2021-01-16 DIAGNOSIS — N1831 Chronic kidney disease, stage 3a: Secondary | ICD-10-CM

## 2021-01-16 MED ORDER — CLONIDINE HCL 0.1 MG TABLET
0.1000 mg | ORAL_TABLET | Freq: Every day | ORAL | 0 refills | Status: DC
Start: 2021-01-16 — End: 2021-01-16

## 2021-01-16 NOTE — Patient Instructions (Signed)
A CT scan has been ordered for you.  The referral has been sent to our referral coordinators who will work with your insurance company to obtain an approval for this test. You will be notified by a postcard in the mail or a telephone call when the referral has been approved. Non-urgent referrals may take up to 2 weeks for approval. Once the referral has been approved, please call [phone] option 3 to schedule an appointment in [place]. You will receive a letter or telephone call with your results.      A laboratory test has been ordered for you. No fasting to do lab 1-2 prior to CT scan.     You have been referred to a specialist. We are pleased to offer the services of [place] [facility] specialists located at the [facility] or [place] sites. Please allow 7 business days (3 if urgent) for the referral to be processed. After that time you may call: Neurology: [place] #[id number], [phone]

## 2021-01-16 NOTE — Telephone Encounter (Signed)
Referral Request:    Patient is calling to request updated referral to Dermatology regarding a problem of Neoplasm of uncertain behavior of skin. She requests a referral her insurance is medicare and [employer] cross.     Pt referral due to expire 9/23 and will need updated referral in order to schedule appt past that date      Roetta Sessions  [facility]

## 2021-01-16 NOTE — Progress Notes (Signed)
Paula Daugherty is a 39yrfemale who presents with a chief complaint of "hot flashes".    Chief Complaint   Patient presents with    Headache    Hot flashes    Diabetes     Needs DM foot exam       Headaches: x 3 mos; left>right throbbing new frontal region; recent normal eye exam; [facility]; cannot suggest is is tension like headaches; poor sleep   Hot flashes; x several yrs; would like to try Clonidine to help sleep   DM2: due for foot exam; no concerns       ROS:   .Constitutional: no fever/chills.  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 90 tablet 3    Atorvastatin (LIPITOR) 10 mg Tablet Take 1 tablet by mouth every day at bedtime. 90 tablet 3    Betamethasone, Augmented (DIPROLENE-AF) 0.05 % cream Apply to affected area of leg twice daily for 2 weeks. 50 g 0    Empagliflozin (JARDIANCE) 25 mg Tablet Take 1 tablet by mouth every day. 90 tablet 3    Insulin Glargine (LANTUS SOLOSTAR U-100 INSULIN) 100 unit/mL (3 mL) Pen Inject 40 units subcutaneously every night at bedtime. Please provide 3 month supply. 45 mL 3    Insulin Pen Needles, Disposable, (BD ULTRA-FINE III SHORT PEN NEEDLE) 31 gauge x 5/16" Use for injecting insulin 1 times per day 100 each 11    Losartan (COZAAR) 25 mg Tablet Take 1 tablet by mouth every day. 90 tablet 0    Metformin (GLUCOPHAGE) 1,000  mg tablet Take 1 tablet by mouth 2 times daily with meals. (diabetes) 180 tablet 3    Metoclopramide (REGLAN) 5 mg Tablet GENERIC FOR REGLAN. TAKE 1 TABLET BY MOUTH THREE TIMES DAILY 30 MINUTES BEFORE EACH MEAL. 270 tablet 1    NYSTATIN 100,000 unit/gram Powder APPLY TO THE AFFECTED AREA FOUR TIMES DAILY 30 g 2    Omeprazole (PRILOSEC) 20 mg Delayed Release Capsule TAKE 1 CAPSULE BY MOUTH TWICE DAILY BEFORE MEALS 180 capsule 0    Ondansetron (ZOFRAN-ODT) 4 mg disintegrating tablet GENERIC FOR ZOFRAN ODT. DISSOLVE 1 TABLET ON THE TONGUE EVERY 8 HOURS AS NEEDED. 50 tablet 1    ONETOUCH ULTRA TEST Strips USE TO TEST BLOOD GLUCOSE TWICE DAILY. 200 strip 3    PEG-Electrolyte Soln (COLYTE, GOLYTELY) 236-22.74-6.74 -5.86 gram Liquid Take as directed by [facility] colonoscopy instructions. Do not follow instructions on the bottle. 4000 mL 0     No current facility-administered medications on file prior to visit.     Allergies:   Allergies  Allergen Reactions    Hydrochlorothiazide Other-Reaction in Comments     Patient reports    Januvia [Sitagliptin] Other-Reaction in Comments     Patient reports    Keflex [Cephalexin] Abdominal Pain    Lyrica [Pregabalin] Other-Reaction in Comments     Patient reports    Omnipaque [Iohexol] Other-Reaction in Comments     Patient reports    Prozac [Fluoxetine Hcl] Other-Reaction in Comments     Patient reports    Sulfa (Sulfonamide Antibiotics) Rash    Topiramate Other-Reaction in Comments     Hairfall       Social History     Socioeconomic History    Marital status: MARRIED    Number of children: 1   Occupational History    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 [employer]    Tobacco Use    Smoking status: Former     Packs/day: 0.10     Years: 20.00     Pack years: 2.00     Types: Cigarettes    Smokeless tobacco: Never    Tobacco comments:     quit 30 years ago   Substance and Sexual Activity    Alcohol use: Yes     Alcohol/week: 0.0 standard drinks     Comment: rare    Drug use: No    Sexual  activity: Yes     Partners: Male     Family History   Problem Relation Name Age of Onset    Diabetes Father      Heart Mother          MI at 56s     Non-contributory Brother      Non-contributory Brother         PE:  BP 130/79 (SITE: right arm, Orthostatic Position: sitting, Cuff Size: regular)   Pulse 69   Temp 36.5 C (97.7 F) (Temporal)   Resp 12   Wt 71.2 kg (156 lb 15.5 oz)   LMP 05/06/1983   BMI 28.71 kg/m   .General Appearance: healthy, alert, no distress, pleasant affect, cooperative.  Eyes:  conjunctivae and corneas clear. PERRL, EOM's intact. sclerae normal, no disc edema.  Extremities:  no cyanosis, clubbing, or edema.  Skin:  warm and dry.  Neuro: grossly appears intact.  Mental Status: appropriate behavior and mentation   Foot Exam: Normal filament sensory exam, palpable pulses, no significant skin lesions  No Advance Directive / POLST on file or [place] / surrogate named. I provided counseling about Advance Care Planning.     Assessment and Plan:  (R51.9) New onset headache  (primary encounter diagnosis)  Comment: new x 3 mos; unclear diagnosis, etiology or prognosis; unable to get MRI  Plan: CT HEAD WITH CONTRAST, Neurology Clinic         Referral, Basic Metabolic Panel          (Z66.06,  N18.31,  Z79.4) Type 2 diabetes mellitus with stage 3a chronic kidney disease, with long-term current use of insulin (HCC)  Comment: last A1c at 6.3 and at goal; kidney function appears stable   Plan: Creatinine Spot Urine, Microalbumin          Number and Complexity of Problems Addressed  2 or more stable chronic illnesses  1 undiagnosed new problem with uncertain prognosis    Amount and/or Complexity of Data to be Reviewed and Analyzed  3+ unique tests ordered    Risk of Complications and/or Morbidity or Mortality of Patient Management  Moderate  Total encounter time including history, physical examination, and coordination of care was approximately 20 minutes of which more than 50% was spent  counseling regarding assessment/diagnosis and treatment plan. No guarantees were made regarding her medical care or treatment outcome. Barriers to Learning: none.  Patient verbalizes understanding of teaching and instructions.    Electronically signed by:    [name], MD  [place], [place] Board of Family Medicine  Associate physician [facility], [place]   [phone]

## 2021-01-16 NOTE — Telephone Encounter (Signed)
Last seen by PCP 01/16/2021.

## 2021-01-16 NOTE — Nursing Note (Signed)
Patient roomed, chief complaint noted, allergies verified, blood pressure, pulse, respiration, temperature, and weight obtained, screened for pain, and pharmacy verified.    2 Tyress Loden, M.A.    PPE Used During the Visit:    Eye and Face Protection:          Body Protection:   []Glasses/Goggles    []Gown   []Face Shield   [x]Surgical Mask   Hand Protection:   []N-95 Mask     []Gloves   []Particulate Respirator   []Half/Full Face Elastomeric Respirator   []PAPR    Patient:    [x]Surgical Mask  Mysti Haley, M.A.

## 2021-01-18 ENCOUNTER — Ambulatory Visit: Payer: Medicare Other | Attending: "Endocrinology

## 2021-01-18 ENCOUNTER — Ambulatory Visit: Payer: Medicare Other | Admitting: Physician Assistant

## 2021-01-18 ENCOUNTER — Encounter: Payer: Self-pay | Admitting: Family Medicine

## 2021-01-18 DIAGNOSIS — E1129 Type 2 diabetes mellitus with other diabetic kidney complication: Secondary | ICD-10-CM

## 2021-01-18 DIAGNOSIS — Z794 Long term (current) use of insulin: Secondary | ICD-10-CM | POA: Insufficient documentation

## 2021-01-18 DIAGNOSIS — R519 Headache, unspecified: Secondary | ICD-10-CM

## 2021-01-18 DIAGNOSIS — N1831 Chronic kidney disease, stage 3a: Secondary | ICD-10-CM

## 2021-01-18 LAB — HEMOGLOBIN A1C
Hgb A1C,Glucose Est Avg: 140 mg/dL
Hgb A1C: 6.5 % — ABNORMAL HIGH (ref 3.9–5.6)

## 2021-01-18 NOTE — Telephone Encounter (Signed)
From: [name]  To: [name], MD  Sent: 01/18/2021 11:24 AM PDT  Subject: Shingles Vaccine     Just wanted to give you an FYI for our records. DJ and I both got the Shingles Shot this morning.   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 Unless you recommend not to I will schedule the Vaccine for first part of October since she says it takes about two weeks to work. TIA for your direction.

## 2021-01-18 NOTE — Telephone Encounter (Signed)
Forwarding to Clinical staff to address.  Please update patient's chart, document and close chart when completed.  Thanks.

## 2021-01-25 ENCOUNTER — Ambulatory Visit: Payer: Medicare Other | Admitting: Obstetrics & Gynecology

## 2021-01-28 ENCOUNTER — Telehealth: Payer: Self-pay | Admitting: Family Medicine

## 2021-01-28 NOTE — Telephone Encounter (Signed)
I was aware she cannot have MRI.   I do not see a contrast allergy; is she aware of a contrast allergy?

## 2021-01-28 NOTE — Telephone Encounter (Signed)
Left message on unidentified voicemail requesting patient to return call to clinic and ask to speak with an advice nurse.     Attn Staff: Upon return call, please transfer caller to PCN Triage    Clearnce Sorrel, RN  PCN triage/ [facility] Advice     Chart review:  Allergy 07/11/2013 Pt reported Omnipaque:USED CT imaging    Gadolinium-containing dyes =MRI- Allergy not seen.

## 2021-01-28 NOTE — Telephone Encounter (Signed)
General Advice / Message to MD    Pt was informed that due to her having a reaction to CAT Scan 45 years ago, pt will need to reach out to PCP for further protocol. Please advised on how to proceed. This is for CT Head with contrast      Please notify via mychart or call      [employer]. Theodis Aguas III [facility] Float Pool    [facility]:  Please obtain basic information such as duration (how long have you had this problem?), location (Where is the pain?), Route to MA pool].

## 2021-01-29 ENCOUNTER — Ambulatory Visit: Payer: Medicare Other | Attending: "Endocrinology | Admitting: "Endocrinology

## 2021-01-29 ENCOUNTER — Ambulatory Visit: Payer: Medicare Other | Admitting: Dermatology

## 2021-01-29 VITALS — BP 128/72 | HR 61 | Temp 97.6°F | Ht 67.0 in | Wt 156.3 lb

## 2021-01-29 DIAGNOSIS — I10 Essential (primary) hypertension: Secondary | ICD-10-CM | POA: Insufficient documentation

## 2021-01-29 DIAGNOSIS — E278 Other specified disorders of adrenal gland: Secondary | ICD-10-CM

## 2021-01-29 DIAGNOSIS — R809 Proteinuria, unspecified: Secondary | ICD-10-CM

## 2021-01-29 DIAGNOSIS — E1129 Type 2 diabetes mellitus with other diabetic kidney complication: Secondary | ICD-10-CM

## 2021-01-29 DIAGNOSIS — R7989 Other specified abnormal findings of blood chemistry: Secondary | ICD-10-CM

## 2021-01-29 DIAGNOSIS — Z794 Long term (current) use of insulin: Secondary | ICD-10-CM | POA: Insufficient documentation

## 2021-01-29 DIAGNOSIS — Z79899 Other long term (current) drug therapy: Secondary | ICD-10-CM | POA: Insufficient documentation

## 2021-01-29 MED ORDER — PREDNISONE 50 MG TABLET
ORAL_TABLET | ORAL | 0 refills | Status: AC
Start: 2021-01-29 — End: 2021-02-28

## 2021-01-29 MED ORDER — LOSARTAN 25 MG TABLET
25.0000 mg | ORAL_TABLET | Freq: Every day | ORAL | 3 refills | Status: DC
Start: 2021-01-29 — End: 2021-08-14

## 2021-01-29 NOTE — Nursing Note (Signed)
Patient identified using  two (2) Identifiers.   Vital sign measurements collected.   Patients' allergies and pharmacy verified.   Patient screened for tobacco use.    Patient screened for pain and fall history  Patient was wearing a surgical mask covering nose and mouth.   Writer followed precautions when caring for the patient by wearing surgical mask..   Provider followed precautions when caring for the patient by wearing surgical mask.    Completed download of glucometer and/or insulin pump. Report given to appointment provider for review.     Salina Stanfield Shelley-Lowery, MA

## 2021-01-29 NOTE — Telephone Encounter (Signed)
Prescription was sent 01/29/2021.  Please call or make an appointment with any questions or concerns or worsening symptoms by calling [phone].

## 2021-01-29 NOTE — Telephone Encounter (Signed)
Paula Daugherty is a 75yrold female  3 patient identifiers used  Relayed MD. Message. Sent Mychart message reference for instructions. No further questions or concerns at this time.  JClearnce Sorrel RN  PCN triage/[place] DRosana HoesAdvice

## 2021-01-29 NOTE — Patient Instructions (Signed)
No changes to diabetes medications today  Stop amlodipine and restart losartan  Have fasting blood and urine tests done now  Return to clinic in ~3 months    To do a 24-hour urine collection:  1. At 7:00 AM, urinate into the toilet. Do not save this urine.  2. The next time you need to urinate, start to collect all the urine in the jug provided. Continue collecting the urine every time you urinate, in the jug, for the next 24 hours until the same time on the following day.  3. Between collections, put the jug in a plastic bag, then a paper bag, and keep in the refrigerator or a styrofoam ice chest filled with ice.  4. Urinate at 7:00 AM the next day to finish the collection, and put that urine in the jug.  5. Return the urine to the lab the same day that you finish the urine collection.    To avoid false-positive results for catecholamines and metanephrines,  avoid these drugs  5 days before the 24-hour urine collection:   acetaminophen/tylenol   benzodiazepines   buspirone   catecholamines   cocaine   diuretics   dopamine   dopaminergic agents   labetalol   levodopa   metoclopramide   methyldopa   sympathomimetics   tricyclic antidepressants   vasodilators    Avoid the following for 1 day before the urine collection:   alcohol   bananas   caffeine   chocolate   nicotine   vanilla

## 2021-01-29 NOTE — Telephone Encounter (Signed)
Paula Daugherty is a 75yrold female  3 patient identifiers used  Relayed MD. Message.   Pt had CT IVP and had an allergic rxn( body hives, SOB, and facial swelling).  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 Pt has not had a reaction with the other CT scans. Per Radiologist pt will need pre-medication protocol in place. If MD. Reply ok to send via [place]    JClearnce Sorrel RN  PCN triage/[place] DRosana HoesAdvice

## 2021-02-05 ENCOUNTER — Encounter: Payer: Self-pay | Admitting: Family Medicine

## 2021-02-05 ENCOUNTER — Ambulatory Visit: Payer: Medicare Other | Attending: "Endocrinology

## 2021-02-05 DIAGNOSIS — Z794 Long term (current) use of insulin: Secondary | ICD-10-CM | POA: Insufficient documentation

## 2021-02-05 DIAGNOSIS — E1129 Type 2 diabetes mellitus with other diabetic kidney complication: Secondary | ICD-10-CM

## 2021-02-05 DIAGNOSIS — R519 Headache, unspecified: Secondary | ICD-10-CM | POA: Insufficient documentation

## 2021-02-05 DIAGNOSIS — E1122 Type 2 diabetes mellitus with diabetic chronic kidney disease: Secondary | ICD-10-CM | POA: Insufficient documentation

## 2021-02-05 DIAGNOSIS — N1831 Chronic kidney disease, stage 3a: Secondary | ICD-10-CM | POA: Insufficient documentation

## 2021-02-05 LAB — LIPID PANEL
Cholesterol: 158 mg/dL (ref <200)
HDL Cholesterol: 64 mg/dL (ref >40)
LDL Cholesterol Calculation: 59 mg/dL (ref <100)
Non-HDL Cholesterol: 94 mg/dL (ref <=150)
Total Cholesterol: HDL Ratio: 2.5 (ref <4.0)
Triglyceride: 174 mg/dL — ABNORMAL HIGH (ref <150)

## 2021-02-05 LAB — BASIC METABOLIC PANEL
Calcium: 9.6 mg/dL (ref 8.6–10.0)
Carbon Dioxide Total: 27 mmol/L (ref 22–29)
Chloride: 102 mmol/L (ref 98–107)
Creatinine Serum: 0.99 mg/dL (ref 0.51–1.17)
E-GFR Creatinine (Female): 57 mL/min/{1.73_m2}
Glucose: 121 mg/dL — ABNORMAL HIGH (ref 74–109)
Potassium: 4.4 mmol/L (ref 3.4–5.1)
Sodium: 140 mmol/L (ref 136–145)
Urea Nitrogen, Blood (BUN): 18 mg/dL (ref 6–20)

## 2021-02-05 LAB — CREATININE SPOT URINE: Creatinine Spot Urine: 90.5 mg/dL

## 2021-02-05 LAB — MICROALBUMIN
Creatinine Spot Urine: 90.5 mg/dL
Microalbumin Urine: 6.8 mg/dL
Microalbumin/Creatinine Ratio: 75 mg/g

## 2021-02-05 NOTE — Telephone Encounter (Signed)
CONSULT MD: Please see pt's message/question and advise.  Last OV - 01/16/21.  Thanks.

## 2021-02-05 NOTE — Telephone Encounter (Signed)
From: [name]  To: [name], MD  Sent: 02/05/2021 1:51 PM PDT  Subject: Blood test results     Hi Dr. Dennis Bast asked if I was fasting for today's blood work. Yes I was. Glad that the numbers turned out OK.

## 2021-02-05 NOTE — Telephone Encounter (Signed)
From: [name]  To: [name], MD  Sent: 02/04/2021 3:21 PM PDT  Subject: Clonidine 0.1 mg    Dr you asked me to let you know if the above medication helped make my hot flashes better. It has not, things are the same. What should we try next? We leave October 19th for a month.   Thank you

## 2021-02-05 NOTE — Telephone Encounter (Signed)
CONSULT MD: Juluis Rainier.  Please see pt's reply to your inquiry on lab results notes.  Thanks.

## 2021-02-06 ENCOUNTER — Other Ambulatory Visit: Payer: Self-pay | Admitting: Family Medicine

## 2021-02-06 ENCOUNTER — Ambulatory Visit
Admission: RE | Admit: 2021-02-06 | Discharge: 2021-02-06 | Disposition: A | Discharge status: Home / Self Care | Payer: Medicare Other | Source: Ambulatory Visit | Attending: Family Medicine | Admitting: Family Medicine

## 2021-02-06 ENCOUNTER — Ambulatory Visit: Payer: Medicare Other | Admitting: Family Medicine

## 2021-02-06 DIAGNOSIS — R519 Headache, unspecified: Secondary | ICD-10-CM | POA: Insufficient documentation

## 2021-02-06 MED ORDER — IOHEXOL 350 MG IODINE/ML INTRAVENOUS SOLUTION - 100 ML BOTTLE
INTRAVENOUS | Status: AC
Start: 2021-02-06 — End: 2021-02-06

## 2021-02-08 NOTE — Progress Notes (Signed)
Endocrinology Follow up clinic note:    Chief Complaint:  "I'm here to follow up on my diabetes."    HPI: Paula Daugherty is a 75yrold female who has a diagnosis of type 2 diabetes mellitus who presents to Endocrinology for follow up. Her history is as follows:  She was initially diagnosed with diabetes around 2000 on routine labs. She has a strong family history of diabetes so she wasn't surprised at this. She was started on Metformin around 2005 and then she was started on insulin in 2014. She was up to 64 units of Lantus at night. However, after making changes to her diet and lifestyle, she was taken off insulin in 2015. Basal insulin was added back to her regimen in 2018.      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      Interim History: She returns today for follow up. Her last visit with Endocrinology was on 10/23/2020.  Since this visit, she states that overall, she has been doing well. She has noted very minimal hypoglycemia and overall, her blood glucose is well controlled.  She has been slowly titrating down on Lantus dose and is no longer taking this and also has decreased metformin to 1000 mg daily.  She also has stopped losartan and despite this change, her blood pressure has been well controlled.    Current Regimen:              Metformin 1000 mg daily              Jardiance 25 mg daily    Hypoglycemia: Very rare these days  Hyperglycemia: Almost never sees numbers >180     Meter brought today: Yes  Please see glucometer download scanned under the media tab  Checks blood glucose: 2x/day  Last eye exam: 10/2020    ROS:  All other systems were reviewed and are negatative except for pertinent positive and negative responses as documented in HPI.     Medications:  Medication reconciliation was performed today.   Outpatient Medications Marked as Taking for the 01/29/21 encounter (Office Visit) with SCorky Mull MD   Medication Sig Dispense Refill     Atorvastatin (LIPITOR) 10 mg Tablet Take 1 tablet by mouth every day at bedtime. 90 tablet 3    CloNIDine (CATAPRES) 0.1 mg Tablet TAKE 1 TABLET BY MOUTH EVERY DAY AT BEDTIME 90 tablet 1    Empagliflozin (JARDIANCE) 25 mg Tablet Take 1 tablet by mouth every day. 90 tablet 3    Losartan (COZAAR) 25 mg Tablet Take 1 tablet by mouth every day. 90 tablet 3    Metformin (GLUCOPHAGE) 1,000 mg tablet Take 1 tablet by mouth 2 times daily with meals. (diabetes) 180 tablet 3    Metoclopramide (REGLAN) 5 mg Tablet GENERIC FOR REGLAN. TAKE 1 TABLET BY MOUTH THREE TIMES DAILY 30 MINUTES BEFORE EACH MEAL. 270 tablet 1    NYSTATIN 100,000 unit/gram Powder APPLY TO THE AFFECTED AREA FOUR TIMES DAILY 30 g 2    Omeprazole (PRILOSEC) 20 mg Delayed Release Capsule TAKE 1 CAPSULE BY MOUTH TWICE DAILY BEFORE MEALS 180 capsule 0    Ondansetron (ZOFRAN-ODT) 4 mg disintegrating tablet GENERIC FOR ZOFRAN ODT. DISSOLVE 1 TABLET ON THE TONGUE EVERY 8 HOURS AS NEEDED. 50 tablet 1    ONETOUCH ULTRA TEST Strips USE TO TEST BLOOD GLUCOSE TWICE DAILY. 200 strip 3     I did review patient's past medical  and family/social history, no changes noted.   PMH:  Past medical history was reviewed from problem list.   Patient Active Problem List   Diagnosis    DM2 (diabetes mellitus, type 2) ([place])    Depression with anxiety    Stress at home    Leg cramps-right calf    Right ear pain    Fibromyalgia    HTN (hypertension)    GERD (gastroesophageal reflux disease)    Hyperlipidemia with target LDL less than 70    Vitamin D insufficiency    Abnormal LFTs    Renal cyst ( 6.4 cm cyst left kidney)    Cough    Fatty liver    Macroalbuminuric diabetic nephropathy (HCC)    History of actinic keratoses    Cataract    Ptosis of eyelid    Liver fibrosis    Adrenal nodule (HCC)    Medication Therapy Auth    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HCC)    Mixed conductive and sensorineural hearing loss of both ears    Neoplasm of uncertain behavior of skin     Abdominal pain, generalized    Nausea without vomiting    Asymptomatic microscopic hematuria    Subcutaneous nodule    Angina pectoris (HCC)    Mild episode of recurrent major depressive disorder (HCC)    NASH (nonalcoholic steatohepatitis)     VITAL SIGNS:  BP 128/72 (SITE: right arm, Orthostatic Position: sitting, Cuff Size: regular)   Pulse 61   Temp 36.4 C (97.6 F) (Tympanic)   Ht 1.702 m (5' 7" )   Wt 70.9 kg (156 lb 4.9 oz)   LMP 05/06/1983   SpO2 100%   BMI 24.48 kg/m   Body mass index is 24.48 kg/m.    PHYSICAL EXAM:  General Appearance: healthy, alert, no distress, pleasant affect, cooperative.  Eyes:  conjunctivae and corneas clear. PERRL, EOM's intact. sclerae normal.  Neck:  Neck supple.   Heart:  normal rate and regular rhythm, no murmurs, clicks, or gallops.  Lungs: clear to auscultation.  Abdomen: BS normal.  Abdomen soft, non-tender.  No masses or organomegaly.  Extremities:  no cyanosis, clubbing, or edema.  Foot Exam: normal DP and PT pulses, no trophic changes or ulcerative lesions, normal sensory exam, and normal monofilament exam.  Skin:  Skin color, texture, turgor normal. No rashes or lesions.  Neuro: Gait normal. No tremor appreciated. Sensation and strength grossly normal.  Mental Status: Appearance/Cooperation: in no apparent distress and well developed and well nourished  Eye Contact: normal  Speech: normal volume, rate, and pitch    LAB TESTS/STUDIES:   I personally reviewed the following  laboratory and imaging studies .   02/23/20 10:27 06/19/20 10:30 08/03/20 13:39 08/15/20 11:29 10/08/20 14:06   Sodium 142  143 143 143   Potassium 4.4  4.3 4.1 4.3   Chloride 104  104 105 102   Carbon Dioxide Total 26  25 26 25    Urea Nitrogen, Blood (BUN) 17  18 18 20    Creatinine Blood 1.06  1.01 0.97 1.07   Glucose 111 (H)  141 (H) 129 (H) 91   Calcium 9.4  9.5 9.5 10.0   Protein 7.7  7.9 8.1    Albumin 3.6  4.3 4.3    Alkaline Phosphatase (ALP) 119 (H)  154 (H) 159 (H)    Aspartate Transaminase  (AST) 45 (H)  36 53 (H)    Bilirubin Total 0.8  0.9 0.8  Alanine Transferase (ALT) 37  33 43 (H)    E-GFR (Female) 53  56 59 52   Cholesterol 136       Triglyceride 148       LDL Cholesterol Calculation 54       HDL Cholesterol 52       Non-HDL Cholesterol 84       Total Cholesterol:HDL Ratio 2.6       Iron Total     58  58   Transferrin     302   Total Iron Binding Capacity     420 (H)   Iron Percent Saturation     13.8 (L)   Ferritin     21   Folate    6.2 (L)    CA 125     7.9   C-Reactive Protein   0.2     Hgb A1C 6.7 (H) 6.4 (H)      Hgb A1C,Glucose Est Avg 146 137      Vitamin B12    161 (L)       08/15/20 11:29   White Blood Cell Count 6.5   Red Blood Cell Count 4.62  4.62   Hemoglobin 11.9 (L)   Hematocrit 37.1  37.1   MCV 80.3   MCH 25.8 (L)   MCHC 32.1   RDW 18.4 (H)   Platelet Count 189      11/02/19 12:00   Creatinine Spot Urine 150.47  150.47   Microalbumin Urine 16.9   Microalbumin/Creatinine Ratio 112 (H)     03/27/2020:  Component Ref Range & Units     TOTAL VOLUME mL 1250    TIME OF COLLECTION hr 24    DOPAMINE,UR PER VOLUME ug/L 47    DOPAMINE,UR PER 24HR 71 - 485 ug/d 59 Low     DOPAMINE,UR RATIO TO CRT 0 - 250 ug/g CRT 67    NOREPINEPHRINE,UR PER VOLUME ug/L 21    NOREPINEPHRINE,UR PER 24HR 14 - 120 ug/d 26    NOREPINEPHRINE,UR RATIO TO CRT 0 - 45 ug/g CRT 30    EPINEPHRINE,UR PER VOLUME ug/L <1    EPINEPHRINE,UR PER 24 HR 1 - 14 ug/d <1    EPINEPHRINE,UR RATIO TO CRT 0 - 20 ug/g CRT <1       Component Ref Range & Units     TOTAL VOLUME mL 1250    TIME OF COLLECTION hr 24    CORTISOL,UR FREE PER VOLUME ug/L 10.80    CORTISOL,UR FREE PER 24HR <=45.0 ug/d 13.5    CORTISOL,UR FREE RATIO TO CRT          Component Ref Range & Units     Urine Volume mL 1,250    START DATE   03/26/2020    START TIME    8:07 AM    STOP DATE   03/27/2020    STOP TIME    8:07 AM    COLLECTION INTERVAL, URINE H 24.00    Creatinine Conc mg/dL 72.61    Creatinine 24 Hr 800-2,000 mg/24Hr 908       Component Ref Range & Units      TOTAL VOLUME mL 1250    TIME OF COLLECTION hr 24    METANEPHRINE,UR-PER VOLUME ug/L 78    METANEPHRINE,UR-PER 24HR 36 - 229 ug/d 98    METANEPHRINE,UR-RATIO TO CRT 0 - 300 ug/g CRT 116    NORMETANEPHRINE,UR-PER VOLUME ug/L 317    NORMETANEPHRINE,UR-PER 24HR 95 - 650 ug/d 396  NORMETANEPHRIN,UR-RATIO TO CRT 0 - 400 ug/g CRT 473 High     METANEPHRINES INTERPRETATION   See Note    CREATININE,UR PER VOLUME mg/dL 67    CREATININE,UR PER 24HR 500 - 1400 mg/d 838       CT Abdomen and Pelvis (08/22/2020):  FINDINGS:  Lower Chest: Unremarkable.  Liver: A few scattered small hypodensities too small to characterize. Mild  liver surface nodularity.  Bile Ducts: Unremarkable.  Gallbladder: Unremarkable.  Pancreas: Unremarkable.  Spleen: Unremarkable.  Adrenal Glands: Unremarkable.  Kidneys: Decreased size of left posterior renal cyst with some peripheral  calcifications.  GI Tract: Colonic diverticulosis without diverticulitis. Small soft tissue  nodule anterior to the descending colon previously had fat density and  therefore likely represents an small area of fat process or old torsed  epiploic appendage. Normal appendix.  Peritoneal Cavity: No free fluid or free air.  Bladder: Unremarkable.  Uterus and Ovaries: Status post hysterectomy. This 3.0 cm cystic lesion of  the left adnexa that is either new or increased from the prior. It does not  have simple fluid attenuation.  Lymph Nodes: Prominent upper abdominal lymph nodes are slightly increased  including a 1.7 lymph node anterior to the hepatic artery, previously 1.2  cm  Major Vascular Structures: There is atherosclerosis of the aorta and branch  vessels.  Soft Tissues: Unremarkable.  Musculoskeletal: Unremarkable.     IMPRESSION:  1. Diverticulosis without diverticulitis.  2. Increasing/new 3.0 cm nonsimple fluid cystic lesion of the left  adnexa. Recommend further evaluation with pelvic ultrasound.  3. Liver surface nodularity suggesting fibrosis.  4. Slight increased  upper abdominal mild lymphadenopathy measuring up  to 1.7 cm is nonspecific. The slow growth argues against an aggressive  process. If patient proves to have chronic liver disease, mild upper  abdominal lymphadenopathy is often associated with this.    Impression: This is a 75yrold female with Diabetes Mellitus Type II who presents to Endocrinology for follow up. Overall, her glycemic control is at goal as evidenced by her most recent A1c  of 6.5%.  Review of her blood glucose meter download today demonstrates overall well controlled blood glucose with minimal hyperglycemia. Based on this, I have not recommended any changes to her current medications.      She was encouraged to report any hypoglycemia or persistent hyperglycemia prior to follow up.      She also has a history of an adrenal nodule. Hormonal evaluation was normal in 03/2020 and she is due for follow up now.     Her blood pressure is well controlled off losartan and on amlodipine only. I encouraged her to restart losartan and stop amlodipine in the setting of type 2 diabetes mellitus and microalbuminuria.      DIABETES HISTORY  (-) h/o retinopathy.      (-) h/o microalbuminuria/nephropathy.  (-) h/o neuropathy.  (-) h/o Autonomic dysfunction  (-) h/o Gastroparesis  (-) h/o CAD  (-) h/o PVD     Last eye exam: 10/2020  Last urine microalbumin: 10/2019, 112  Pneumovax: 12/2014, Prevnar 2018  Influenza vaccine: fall 2021  (-) ASA  (+) Statin  (+) ACE/ARB          Recommendations:  (E11.29) Type 2 diabetes mellitus with other diabetic kidney complication  (primary encounter diagnosis)  - Continue current dose of metformin, and Jardiance,   - Encouraged dietary changes as the patient has been working on  - Encouraged patient to continue close blood glucose monitoring   -  Last LDL at goal, continue statin, repeat due 02/2021  - Last microalbumin:creatinine ratio elevated, continue cozaar, repeat due 10/2020  - Up to date on screening eye exam  - Repeat A1c prior  to follow up appointment  - Influenza vaccine and COVID-19 vaccinations up to date  - Discussed daily foot care  - Restart ARB and discontinue amlodipine     2. Adrenal nodule  - Biochemical evaluation normal in 01/2019, stability by imaging in 04/2017  - Repeat hormonal evaluation due in fall 2022 (~1 year from previous) for 5 years total (2022)     Follow up in 3 months    If you have any questions, please do not hesitate to contact me at [phone].  Thank you for allowing me to participate in the care of this patient.    EDUCATION:  I educated/instructed the patient or caregiver regarding all aspects of the above stated plan of care.  The patient or caregiver indicated understanding.      [place] interpreter was not used.    Report electronically signed by:  Sunday Spillers, M.D.  Clinical Associate Professor  Department of Endocrinology

## 2021-02-08 NOTE — Telephone Encounter (Signed)
Spoke with patient.   Pt scheduled for an appointment 02/13/2021.

## 2021-02-13 ENCOUNTER — Ambulatory Visit: Payer: Medicare Other | Admitting: Family Medicine

## 2021-02-13 ENCOUNTER — Ambulatory Visit
Admission: RE | Admit: 2021-02-13 | Discharge: 2021-02-13 | Disposition: A | Discharge status: Home / Self Care | Payer: Medicare Other | Source: Ambulatory Visit | Attending: Diagnostic Radiology | Admitting: Diagnostic Radiology

## 2021-02-13 DIAGNOSIS — N9489 Other specified conditions associated with female genital organs and menstrual cycle: Secondary | ICD-10-CM | POA: Insufficient documentation

## 2021-02-14 ENCOUNTER — Other Ambulatory Visit: Payer: Self-pay | Admitting: Obstetrics & Gynecology

## 2021-02-14 DIAGNOSIS — N83202 Unspecified ovarian cyst, left side: Secondary | ICD-10-CM

## 2021-02-15 ENCOUNTER — Encounter: Payer: Self-pay | Admitting: Obstetrics & Gynecology

## 2021-02-15 NOTE — Telephone Encounter (Signed)
From: [name]  To: Leta Speller, MD  Sent: 02/14/2021 3:36 PM PDT  Subject: Ultrasound results     I received your message about the decrease in the size of my ovarian cyst. You said to do another ultrasound in 6 months. Does the appointment center have what they need to be able to schedule? Also do you want the test done in [place] or at the [facility]?     I currently have an appointment to see you Monday, October 17th. Do you want me to cancel or keep it?     Thank you for working through this with me.     Sincerely, Paula Daugherty

## 2021-02-18 ENCOUNTER — Ambulatory Visit: Payer: Medicare Other | Admitting: Obstetrics & Gynecology

## 2021-02-18 ENCOUNTER — Telehealth: Payer: Self-pay | Admitting: Family Medicine

## 2021-02-18 VITALS — BP 159/85 | HR 54 | Wt 155.4 lb

## 2021-02-18 DIAGNOSIS — I10 Essential (primary) hypertension: Secondary | ICD-10-CM

## 2021-02-18 DIAGNOSIS — N838 Other noninflammatory disorders of ovary, fallopian tube and broad ligament: Secondary | ICD-10-CM

## 2021-02-18 DIAGNOSIS — R232 Flushing: Secondary | ICD-10-CM

## 2021-02-18 DIAGNOSIS — N949 Unspecified condition associated with female genital organs and menstrual cycle: Secondary | ICD-10-CM

## 2021-02-18 DIAGNOSIS — N951 Menopausal and female climacteric states: Secondary | ICD-10-CM

## 2021-02-18 MED ORDER — PAROXETINE MESYLATE (MENOPAUSAL SYMPTOMS SUPPRESSANT) 7.5 MG CAPSULE
7.5000 mg | ORAL_CAPSULE | Freq: Every day | ORAL | 3 refills | Status: DC
Start: 2021-02-18 — End: 2021-07-16

## 2021-02-18 MED ORDER — PAROXETINE MESYLATE (MENOPAUSAL SYMPTOMS SUPPRESSANT) 7.5 MG CAPSULE
7.5000 mg | ORAL_CAPSULE | Freq: Every day | ORAL | 3 refills | Status: DC
Start: 2021-02-18 — End: 2021-02-18

## 2021-02-18 NOTE — Progress Notes (Signed)
Patients BP still high per Dr. Gerarda Fraction if still high patient should see pcp. I told patient and told her I could get her scheduled on 02/19/21, patient said she has a lot going on and does not have time for an appt,so she declined scheduling. I told her to monitor bp at home and send Dr. [name] a mychart message letting her know of the high bps, she agreed that she would.

## 2021-02-18 NOTE — Telephone Encounter (Signed)
Called pharmacy and they had someone else from the office already call to update. No further action needed.   Preston Fleeting, MA

## 2021-02-18 NOTE — Telephone Encounter (Signed)
3 patient identifiers used.  Per:   [facility] Pharmacist    Disposition: Request Rx from MD: Please re-send RX d/t the [facility] system accidentally deleted the Paroxetine RX.    Langley Gauss Suriyah Vergara  PCN Triage Nurse    PARoxetine mesylate,menop.sym, (BRISDELLE) 7.5 mg Capsule [[id number]]     Order Details  Dose: 7.5 mg Route: ORAL Frequency: DAILY (OUTPATIENT)   Dispense Quantity: 90 capsule Refills: 3          Sig: Take 1 capsule by mouth every day

## 2021-02-18 NOTE — Progress Notes (Signed)
ASSESSMENT & PLAN      Diagnoses and all orders for this visit:  Adnexal cyst  -     US PELVIS, TRANSVAG + US PELVIS, TRANSABD, NON-OB, COMPLETE; Future  - reassuring in that it has decreased in size, again appears simple (did not on first scan)  - plan repeat US in 6 months    Vasomotor symptoms due to menopause  Other orders  -     PARoxetine mesylate,menop.sym, (BRISDELLE) 7.5 mg Capsule; Take 1 capsule by mouth every day.  - trial brisdelle, failed clonidine, will start next month after returning from vacation    Hypertension  - f/up with PCP, improved on re-check         Hidalgo is a 57yrfemale. She had concerns including Follow Up With Specialist.  Pain on her lower left abdomen is unchanged.  Feels about the same, perhaps more often, not stronger.  BMs are between constipation and diarrhea.  No bleeding.  + nausea - takes zofran.      Still dealing with hot flashes.  Over 20 years since hysterectomy but has not stopped having hot flashes.  Tried clonidine without relief             OBJECTIVE     She  weight is 70.5 kg (155 lb 6.8 oz). Her blood pressure is 159/85 and her pulse is 54.     Gen: alert in NAD    Ultrasounds reviewed from 02/2021, 11/2020 and 09/2020  - cyst appears simple again, 2cm left adnexal cyst seen only TA                    Total time I spent in care of this patient today (excluding time spent on other billable services) was 20 minutes.         Electronically signed by:  JBruce Donath PGerarda Fraction MBancroft

## 2021-02-18 NOTE — Progress Notes (Signed)
PPE Used During the Visit:    Eye and Face Protection:          Body Protection:   []Glasses/Goggles    []Gown   []Face Shield   [x]Surgical Mask   Hand Protection:   []N-95 Mask     []Gloves   []Particulate Respirator   []Half/Full Face Elastomeric Respirator   []PAPR    Patient:    [x]Surgical Mask

## 2021-02-19 ENCOUNTER — Encounter: Payer: Self-pay | Admitting: "Endocrinology

## 2021-02-19 NOTE — Telephone Encounter (Signed)
From: [name]  To: Minna Merritts, MD  Sent: 02/19/2021 7:59 AM PDT  Subject: BP Medication     Good morning Dr. I wanted to share that my BP was evaluated while seeing Dr Gerarda Fraction yesterday. The systolic got to 595 at one point but was down to 159 before leaving. I took it at home 3x last night and still above 160 each time. Diastolic got to the 63-87 range so I know that is ok. Since we are leaving for a month tomorrow I decided to put the Amlodipne back into my meds in hopes it will bring it back down. I wanted you to know. I am taking the Losartan as well. Please let me know if you prefer that I change this plan.   As always thank you for all you do for me.   BTW Dr. Gerarda Fraction told us that she will be out on leave for my next visit. She is having a baby.

## 2021-02-22 ENCOUNTER — Other Ambulatory Visit: Payer: Self-pay | Admitting: "Endocrinology

## 2021-02-22 DIAGNOSIS — I10 Essential (primary) hypertension: Secondary | ICD-10-CM

## 2021-03-25 ENCOUNTER — Encounter: Payer: Self-pay | Admitting: Obstetrics & Gynecology

## 2021-03-25 NOTE — Telephone Encounter (Signed)
Called patient's [employer] on record. Spoke with pharmacist tech who stated there was no medication on record for the patient despite our system showing as receipt confirmed by patient. Transferred to the pharmacist- gave verbal order for medication exactly as written by Dr. Gerarda Fraction on 02/18/21 with read back confirmed.     Message sent back to patient.     PARoxetine mesylate,menop.sym, (BRISDELLE) 7.5 mg Capsule [[id number]]     Order Details  Dose: 7.5 mg Route: ORAL Frequency: DAILY (OUTPATIENT)   Dispense Quantity: 90 capsule Refills: 3          Sig: Take 1 capsule by mouth every day.         Start Date: 02/18/21 End Date: 02/13/22 after 360 doses   Written Date: 02/18/21 Expiration Date: 02/18/22   Original Order:  PARoxetine mesylate,menop.sym, (BRISDELLE) 7.5 mg Capsule [[id number]]   Providers    Authorizing Provider:    Alfredo Bach, MD   [address] [place], [place] [address]   Phone:  [phone]   Fax:  [phone]   DEA #:  [id number]   NPI:  [id number]        Ordering User:  Alfredo Bach, [place] [phone] - [place], [place] AT [place], [phone] [facility] [phone] [place]   [place], [place] [address]   Phone:  [phone]  Fax:  [phone]   DEA #:  [id number]   DAW Reason: --     Outpatient Medication Detail     Disp Refills Start End DAW   PARoxetine mesylate,menop.sym, (BRISDELLE) 7.5 mg Capsule 90 capsule 3 02/18/2021 02/13/2022 No   Sig - Route: Take 1 capsule by mouth every day. - ORAL   Sent to pharmacy as: PARoxetine mesylate (menopausal symptoms suppressant) 7.5 mg capsule   Class: Pharmacy   E-Prescribing Status: Receipt confirmed by pharmacy (02/18/2021 12:09 PM PDT)       Judson Roch, RN  Float Nurse    Please do not route any further orders to me personally, I am a float RN and am not in the same clinic on a daily basis. Please route any further actions needed to the  appropriate pool or the Secondary school teacher.

## 2021-03-25 NOTE — Telephone Encounter (Signed)
From: [name]  To: Leta Speller, MD  Sent: 03/23/2021 9:53 AM PST  Subject: New Medication     Hello Dr. I saw you on October 17th. At that visit we discussed my hot flashes. You were going to order Paroxtine for me to start taking when I returned from vacation. I tried to get [facility] and was told that there is not an Rx in the system for this medication for me. I don't know what happened but would you please order it again at the [facility] on [address] in [place]. Thank you very much.   Have a Happy Thanksgiving.

## 2021-03-26 ENCOUNTER — Other Ambulatory Visit: Payer: Self-pay | Admitting: GASTROENTEROLOGY

## 2021-03-26 NOTE — Progress Notes (Signed)
Pre GI procedure call:  3 patient identifications obtained. Patient confirmed egd/colonoscopy appointment on 04/05/21 at 0900, aware must have a driver secured for after procedure. Patient confirmed prep instructions were received and has bowel prep. Informed pt of covid testing guideline. Instructed to call [facility] if any questions or concerns at [phone] option # 3. All questions answered and verbalized understanding.     Maxten Shuler Cazares-Mariscal, LVN

## 2021-03-27 NOTE — Telephone Encounter (Signed)
Last refill: 01/10/2021

## 2021-04-02 ENCOUNTER — Encounter: Payer: Self-pay | Admitting: "Endocrinology

## 2021-04-02 NOTE — Progress Notes (Signed)
GASTROENTEROLOGY PRE-PROCEDURE H&P    Referring provider: [name], MD  Procedure: EGD and Colonoscopy    Subjective:  HPI: Paula Daugherty is a 75yrold female who presents for: history of neuroendocrine tumor of the duodenum and tubular adenoma of the colon    RSWH:[id number] negative.  CV: negative.  Resp: negative.  GI: negative.     I did review all available medical, surgical, personal/social history.    Physical exam:  General Appearance: healthy, alert, no distress, pleasant affect, cooperative.  Heart:  normal rate and regular rhythm, no murmurs, clicks, or gallops.  Lungs: clear to auscultation.  Abdomen: BS normal.  Abdomen soft, non-tender.  No masses or organomegaly.    Airway Assessment:    Mallampati class 2  ROM normal      Labs/imaging:  [facility] reviewed: see computer database.  Performed outside of [facility] (reviewed if applicable):     Impression/ plan:  EGD and Colonoscopy    Patient is a candidate for moderate sedation.  Patient is ASA status: 2    The procedure, risks, benefits, and alternatives were explained.  All patient questions were answered.  The informed consent was signed, and will be scanned into the computer database at a later date.    Patient barriers to learning: none    Patient/family understanding: verbalizes    EGeorgeanne Nim MD

## 2021-04-02 NOTE — Telephone Encounter (Signed)
From: [name]  To: Minna Merritts, MD  Sent: 03/31/2021 11:31 AM PST  Subject: Insulin ipdate    Hello. Just FYI I have finished working my way off insulin again since the cruise. I want to drop back to Metformin 1091m one time a day at bedtime.     Also waiting to hear if you want any changes to my med for procedures this Friday?     As always thank you for your help and guidance.

## 2021-04-05 ENCOUNTER — Encounter: Payer: Self-pay | Admitting: GASTROENTEROLOGY

## 2021-04-05 ENCOUNTER — Ambulatory Visit
Admission: RE | Admit: 2021-04-05 | Discharge: 2021-04-05 | Disposition: A | Discharge status: Home / Self Care | Payer: Medicare Other | Source: Ambulatory Visit | Attending: GASTROENTEROLOGY | Admitting: GASTROENTEROLOGY

## 2021-04-05 DIAGNOSIS — Z85060 Personal history of malignant carcinoid tumor of small intestine: Secondary | ICD-10-CM | POA: Insufficient documentation

## 2021-04-05 DIAGNOSIS — Z86010 Personal history of colonic polyps: Secondary | ICD-10-CM | POA: Insufficient documentation

## 2021-04-05 DIAGNOSIS — C7A8 Other malignant neuroendocrine tumors: Secondary | ICD-10-CM

## 2021-04-05 DIAGNOSIS — K635 Polyp of colon: Secondary | ICD-10-CM | POA: Insufficient documentation

## 2021-04-05 DIAGNOSIS — D122 Benign neoplasm of ascending colon: Secondary | ICD-10-CM | POA: Insufficient documentation

## 2021-04-05 DIAGNOSIS — D126 Benign neoplasm of colon, unspecified: Secondary | ICD-10-CM

## 2021-04-05 DIAGNOSIS — K621 Rectal polyp: Secondary | ICD-10-CM | POA: Insufficient documentation

## 2021-04-05 HISTORY — PX: COLONOSCOPY: GILAB00002

## 2021-04-05 HISTORY — PX: EGD: GILAB00003

## 2021-04-05 MED ORDER — LIDOCAINE HCL 2 % MUCOSAL SOLUTION
5.0000 mL | Freq: Once | Status: DC
Start: 2021-04-05 — End: 2021-04-11

## 2021-04-05 MED ORDER — FENTANYL (PF) 50 MCG/ML INJECTION SOLUTION
12.5000 ug | INTRAMUSCULAR | Status: DC | PRN
Start: 2021-04-05 — End: 2021-04-11

## 2021-04-05 MED ORDER — SIMETHICONE 40 MG/0.6 ML ORAL DROPS,SUSPENSION
40.0000 mg | Freq: Once | ORAL | Status: DC
Start: 2021-04-05 — End: 2021-04-11

## 2021-04-05 MED ORDER — NACL 0.9% IV INFUSION
INTRAVENOUS | Status: DC
Start: 2021-04-05 — End: 2021-04-11

## 2021-04-05 MED ORDER — DIPHENHYDRAMINE 50 MG/ML INJECTION SOLUTION
12.5000 mg | INTRAMUSCULAR | Status: DC | PRN
Start: 2021-04-05 — End: 2021-04-11

## 2021-04-05 MED ORDER — EPINEPHRINE 0.1 MG/ML INJECTION SYRINGE
1.0000 mg | INJECTION | INTRAMUSCULAR | Status: DC | PRN
Start: 2021-04-05 — End: 2021-04-11

## 2021-04-05 MED ORDER — ONDANSETRON 8 MG DISINTEGRATING TABLET: 8.0000 mg | DISINTEGRATING_TABLET | Freq: Three times a day (TID) | ORAL | 1 refills | Status: DC | PRN

## 2021-04-05 MED ORDER — ATROPINE 0.4 MG/ML INJECTION SOLUTION
0.5000 mg | INTRAMUSCULAR | Status: DC | PRN
Start: 2021-04-05 — End: 2021-04-11

## 2021-04-05 MED ORDER — FLUMAZENIL 0.1 MG/ML INTRAVENOUS SOLUTION
0.2000 mg | INTRAVENOUS | Status: DC | PRN
Start: 2021-04-05 — End: 2021-04-11

## 2021-04-05 MED ORDER — MIDAZOLAM 1 MG/ML INJECTION SOLUTION
0.5000 mg | INTRAMUSCULAR | Status: DC | PRN
Start: 2021-04-05 — End: 2021-04-11

## 2021-04-05 MED ORDER — NALOXONE 0.4 MG/ML INJECTION SOLUTION
0.4000 mg | INTRAMUSCULAR | Status: DC | PRN
Start: 2021-04-05 — End: 2021-04-11

## 2021-04-05 MED ORDER — ONDANSETRON HCL (PF) 4 MG/2 ML INJECTION SOLUTION
4.0000 mg | INTRAMUSCULAR | Status: DC | PRN
Start: 2021-04-05 — End: 2021-04-11

## 2021-04-05 NOTE — Nursing Note (Signed)
Tissue Biopsies taken and sent to pathology.  Prior to sending the specimens, patient identifiers assessed and Dr. Virgel Bouquet, verified and authorized the pathology orders at bedside. Bedelia Person, RN

## 2021-04-05 NOTE — Discharge Instructions (Addendum)
Discharge Instructions for upper endoscopy and colonoscopy    Findings:  colonoscopy polyps x6 removed (all small and benign appearing) hemorrhoids, sigmoid diverticulosis  upper endoscopy normal (no residual tumor)    Activity:    Rest today, resume usual activity tomorrow     Diet:    Resume usual diet:     Medication:    Resume Aspirin and non-steroidals  in 7 days and Resume anticoagulants such as coumadin, warfarin, plavix, etc.  in 7 days  I increased your Zofran ODT to 8 mg    Follow up:   We will notify you of your biopsy results via phone call or letter.If you do not receive notice of your results by three weeks, please call us at the phone number provided below.      During your procedure, air was pumped into your GI tract so your doctor could see clearly to make a diagnosis and/or treat your problem.     Some possible side effects you may experience are:   - Discomfort due to a distended (bloated) abdomen which will subside after a few hours to two days.   - Nausea may be a side effect of the medication and will subside.   - The medications you received may make you dizzy and sleepy, it is important that you do not drive, operate machinery or drink alcohol for at least one day.   - Severe pain is not expected and should be reported    Other side effects may include:  Upper Endoscopy: A sore throat can be helped by cough drops, or a salt-water gargle.  Flex. Sig. Or Colonoscopy: A small amount of diarrhea may follow the exam.  Polyp Removal in Colon: A small amount of bleeding is not unusual. If it continues after one day, notify the doctor. If there is a large amount of bright red blood, go to the emergency Room and be seen by a doctor.    If problems, call [phone], Option 3 during business hours Monday through Friday 7:30am to 4:30 pm.  After hours call [phone] and ask to speak to the GI Fellow on call.

## 2021-04-05 NOTE — Nurse Focus (Signed)
Home Antigen Screening test must be completed 24-48 hours prior to surgery/procedure upon arrival.  The patient attests to completing a home screening test and reported that the result is Negative.  *if a patient arrives on the day of surgery/procedure with symptoms, regardless of home test results, a rapid COVID/Influenza PCR test will be performed in the Preoperative or procedural area prior to procedure or surgery.

## 2021-04-05 NOTE — Nurse Focus (Signed)
Amount of time spent educating patient: 20 minutes    Education       Title: Endoscopy (Resolved)       Topic: Overview (Resolved)       Point: Indications (Resolved)       Description:   Indications for a GI endoscopy may include:       biopsy    difficulty swallowing    gastric or esophageal ulcer    GI bleeding    unexplained anemia    persistent heartburn    persistent vomiting    persistent reflux symptoms despite therapy    cancer screening    abnormal radiologic finding    foreign body removal    diagnostic for inflammatory bowel disease    Focus education on patient-specific indication.               Patient Friendly Description:   There are many reasons to have this procedure.       The doctor will talk to you about why it is important for you.    Please ask questions if you do not understand.      Learning Progress Summary             Patient Acceptance, E, VU by RL at 04/05/2021 1040    Acceptance, E, VU by JT at 04/05/2021 0845                         Point: Procedure Review (Resolved)       Description:   Gastrointestinal (GI) endoscopy is a procedure to examine the lining of the esophagus, stomach, upper small intestine, colon or rectum.       A flexible, lighted fiber optic scope is used.    Review anticipated anesthesia type.    Post-procedure care will include:    monitoring during sedation recovery    IV fluid    gradual diet advancement               Patient Friendly Description:   A gastrointestinal (GI) endoscopy is a long, flexible tube.       It looks at the lining of parts of your digestive system.    It is put down the throat or in your bottom.    A light and a tiny camera is at the end of the scope. Other special tools can be used through a hole in the scope.    Medicine will help make you sleepy and comfortable.      Learning Progress Summary             Patient Acceptance, E, VU by RL at 04/05/2021 1040    Acceptance, E, VU by JT at 04/05/2021 0845                                 Topic:  Self-Management (Resolved)       Point: Activity (Resolved)       Description:   Review post-discharge activity restrictions which may include:       driving    work/school    Someone should be with the patient to make sure he/she is safe until fully recovered from sedation.               Patient Friendly Description:   Follow the doctor's instructions for activity.       Rest for today. Do not  try to do too much.    There may be limits set for:    work, school    playing sports, exercise    driving as applicable    use of tools or appliances    signing of legal papers    Someone should stay with you until you are safe or have completely recovered from sedation.      Learning Progress Summary             Patient Acceptance, E, VU by RL at 04/05/2021 1040                         Point: Fluid/Food Intake (Resolved)       Description:   Encourage advancing diet gradually once swallowing has returned to normal and patient is alert.                  Patient Friendly Description:   After the procedure, your throat might be numb. When you can take small sips of water without coughing, you may start eating and drinking.         Learning Progress Summary             Patient Acceptance, E, VU by RL at 04/05/2021 1040                         Point: Resuming Home Medication (Resolved)       Description:   Resume regular medications as directed by provider after procedure.       Patient may need to hold anticoagulants, sedatives and narcotics for a specified amount of time.               Patient Friendly Description:   The doctor will tell you when it is safe to take home medicines.       Depending on the procedure, you may be asked to wait to take some medicines again.    This might include:    blood thinners    pain medicine    medicines that help you relax      Learning Progress Summary             Patient Acceptance, E, VU by RL at 04/05/2021 1040                         Point: Promote Flatus Passing (Resolved)        Description:   Encourage gas expulsion and alternating positions to facilitate.                  Patient Friendly Description:   Air was used to help see better during the procedure.       Extra air can give your child/you stomach pain after the procedure.    It will be important to pass gas and burp the extra air out. Walking and changing position helps move air up or down.    Right side-lying and fetal position (knees to the chest) might help with comfort.      Learning Progress Summary             Patient Acceptance, E, VU by RL at 04/05/2021 1040                                 Topic: When to Waterproof  Attention (Resolved)       Point: Nausea and Vomiting (Resolved)       Description:   Provider (specify) should be called if:       unable to tolerate food, drink or medication    loss of appetite    symptoms do not resolve    Immediate medical care should be sought if:    forceful vomiting    blood in stool or vomit    symptoms become worse               Patient Friendly Description:   Do not force yourself to eat, but try to sip some clear liquids.       Call the doctor if:    you can't keep food or drink down without getting sick    you don't have an appetite    you do not feel better    Get medical care right away if:    you see blood in your poop (stool) or vomit    you feel worse      Learning Progress Summary             Patient Acceptance, E, VU by RL at 04/05/2021 1040                         Point: Sleepiness, Persistent (Resolved)       Description:   Provider (specify) should be called if:       continued sedation effects the next day    symptoms do not resolve    Immediate medical care should be sought if:    difficult to arouse/change in level of consciousness    symptoms become worse               Patient Friendly Description:   Call the doctor if:       you have a hard time waking up the next day    symptoms do not get better      Learning Progress Summary             Patient Acceptance, E, VU by  RL at 04/05/2021 1040                                 Topic: Text (Resolved)       Point: Colonoscopy, Adult (Resolved)       Description:   Handout for Patient                  Patient Friendly Description:   Education for You      Patient Handouts:  Elsevier Document     Learning Progress Summary             Patient Acceptance, E, VU by RL at 04/05/2021 1040                         Point: Upper Endoscopy, Adult, Care After (Resolved)       Description:   Handout for Patient                  Patient Friendly Description:   Education for You      Patient Handouts:  Elsevier Document     Learning Progress Summary             Patient Acceptance, E, VU by RL at 04/05/2021  [place]: Upper Endoscopy, Adult (Resolved)       Description:   Handout for Patient                  Patient Friendly Description:   Education for You      Patient Handouts:  Elsevier Document     Learning Progress Summary             Patient Acceptance, E, VU by RL at 04/05/2021 1040                         Point: Colonoscopy, Adult, Care After (Resolved)       Description:   Handout for Patient                  Patient Friendly Description:   Education for You      Patient Handouts:  Elsevier Document     Learning Progress Summary             Patient Acceptance, E, VU by RL at 04/05/2021 1040                                         User Key       Initials Effective Dates Name Provider Type Discipline    RL 03/30/20 Rosie Fate, RN .NURSE: (RN or LVN) nurse    JT 08/30/20 Delbert Harness, RN .NURSE: (RN or LVN) nurse

## 2021-04-05 NOTE — Nurse Focus (Signed)
Tissue Biopsies taken and sent to pathology.  Prior to sending the specimens, patient identifiers assessed and Dr. Virgel Bouquet, verified and authorized the pathology orders at bedside. Bedelia Person, RN

## 2021-04-08 ENCOUNTER — Encounter: Payer: Self-pay | Admitting: GASTROENTEROLOGY

## 2021-04-08 LAB — SURGICAL PATHOLOGY

## 2021-04-09 ENCOUNTER — Other Ambulatory Visit: Payer: Self-pay | Admitting: Family Medicine

## 2021-04-09 NOTE — Telephone Encounter (Signed)
Last seen by PCP 01/16/2021.

## 2021-04-12 ENCOUNTER — Ambulatory Visit: Payer: Medicare Other | Attending: "Endocrinology

## 2021-04-12 DIAGNOSIS — E278 Other specified disorders of adrenal gland: Secondary | ICD-10-CM

## 2021-04-12 LAB — CREATININE 24 HOUR URINE
COLLECTION INTERVAL, URINE: 24 h
Creatinine 24 Hr: 868 mg/24Hr (ref 720–1510)
Creatinine Conc: 53.6 mg/dL
Urine Volume: 1620 mL

## 2021-04-16 LAB — CORTISOL URINE FREE BY LC-MS/MS
CORTISOL,UR FREE PER 24HR: 29.2 ug/d (ref <=45.0)
CORTISOL,UR FREE PER VOLUME: 18 ug/L
CORTISOL,UR FREE RATIO TO CRT: 31.58 ug/g{creat}
CREATININE,UR PER 24HR: 923 mg/d (ref 500–1400)
CREATININE,UR PER VOLUME: 57 mg/dL
TIME OF COLLECTION: 24 h
TOTAL VOLUME: 1620 mL

## 2021-04-17 ENCOUNTER — Encounter: Payer: Self-pay | Admitting: Family Medicine

## 2021-04-17 ENCOUNTER — Ambulatory Visit: Payer: Medicare Other | Admitting: Family Medicine

## 2021-04-17 VITALS — BP 118/71 | HR 66 | Temp 97.1°F | Resp 16 | Ht 62.36 in | Wt 159.2 lb

## 2021-04-17 DIAGNOSIS — N1831 Chronic kidney disease, stage 3a: Secondary | ICD-10-CM

## 2021-04-17 DIAGNOSIS — N9489 Other specified conditions associated with female genital organs and menstrual cycle: Secondary | ICD-10-CM

## 2021-04-17 DIAGNOSIS — D126 Benign neoplasm of colon, unspecified: Secondary | ICD-10-CM

## 2021-04-17 DIAGNOSIS — Z1231 Encounter for screening mammogram for malignant neoplasm of breast: Secondary | ICD-10-CM

## 2021-04-17 DIAGNOSIS — I129 Hypertensive chronic kidney disease with stage 1 through stage 4 chronic kidney disease, or unspecified chronic kidney disease: Secondary | ICD-10-CM

## 2021-04-17 DIAGNOSIS — Z7984 Long term (current) use of oral hypoglycemic drugs: Secondary | ICD-10-CM

## 2021-04-17 DIAGNOSIS — Z87891 Personal history of nicotine dependence: Secondary | ICD-10-CM

## 2021-04-17 DIAGNOSIS — E1122 Type 2 diabetes mellitus with diabetic chronic kidney disease: Secondary | ICD-10-CM

## 2021-04-17 DIAGNOSIS — Z79899 Other long term (current) drug therapy: Secondary | ICD-10-CM

## 2021-04-17 DIAGNOSIS — Z8249 Family history of ischemic heart disease and other diseases of the circulatory system: Secondary | ICD-10-CM

## 2021-04-17 DIAGNOSIS — Z794 Long term (current) use of insulin: Secondary | ICD-10-CM

## 2021-04-17 DIAGNOSIS — N949 Unspecified condition associated with female genital organs and menstrual cycle: Secondary | ICD-10-CM

## 2021-04-17 LAB — METANEPHRINES FRACTIONATED BY HPLC-MS/MS, URINE
CREATININE,UR PER 24HR: 940 mg/d (ref 500–1400)
CREATININE,UR PER VOLUME: 58 mg/dL
METANEPHRINE,UR-PER 24HR: 105 ug/d (ref 36–229)
METANEPHRINE,UR-PER VOLUME: 65 ug/L
METANEPHRINE,UR-RATIO TO CRT: 112 ug/g{creat} (ref 0–300)
NORMETANEPHRIN,UR-RATIO TO CRT: 386 ug/g{creat} (ref 0–400)
NORMETANEPHRINE,UR-PER 24HR: 363 ug/d (ref 95–650)
NORMETANEPHRINE,UR-PER VOLUME: 224 ug/L
TIME OF COLLECTION: 24 h
TOTAL VOLUME: 1620 mL

## 2021-04-17 LAB — CATECHOLAMINES,URINE FREE SENDOUT
CREATININE,UR PER 24HR: 940 mg/d (ref 500–1400)
CREATININE,UR PER VOLUME: 58 mg/dL
DOPAMINE,UR PER 24HR: 123 ug/d (ref 71–485)
DOPAMINE,UR PER VOLUME: 76 ug/L
DOPAMINE,UR RATIO TO CRT: 131 ug/g CRT (ref 0–250)
EPINEPHRINE,UR PER 24 HR: 2 ug/d (ref 1–14)
EPINEPHRINE,UR PER VOLUME: 1 ug/L
EPINEPHRINE,UR RATIO TO CRT: 2 ug/g CRT (ref 0–20)
NOREPINEPHRINE,UR PER 24HR: 39 ug/d (ref 14–120)
NOREPINEPHRINE,UR PER VOLUME: 24 ug/L
NOREPINEPHRINE,UR RATIO TO CRT: 41 ug/g CRT (ref 0–45)
TIME OF COLLECTION: 24 hr
TOTAL VOLUME: 1620 mL

## 2021-04-17 NOTE — Nursing Note (Signed)
Patient roomed, chief complaint noted, allergies verified, blood pressure, pulse, respiration, temperature, and weight obtained, screened for pain, and pharmacy verified.    2 Braden Deloach, M.A.    PPE Used During the Visit:    Eye and Face Protection:          Body Protection:   []Glasses/Goggles    []Gown   []Face Shield   [x]Surgical Mask   Hand Protection:   []N-95 Mask     []Gloves   []Particulate Respirator   []Half/Full Face Elastomeric Respirator   []PAPR    Patient:    [x]Surgical Mask  Paula Daugherty, M.A.

## 2021-04-17 NOTE — Patient Instructions (Addendum)
A laboratory test has been ordered for you. No fasting.    A mammogram has been ordered for you.  Please call [phone] to schedule an appointment in [place]. You will receive a letter or telephone call with your results.      - - - - - - - - - - Information on GYNECOLOGIC CANCERS - - - - - - - - - -    What Women Need to Know    What are Women's Reproductive Cancers?    These are cancers of a woman's reproductive sex organs.  They are also called gynecologic cancers.  They include cancer of the:  -Vulva (lips around the opening of the vagina)  -Vagina (the birth canal)  -Cervix (The opening to the womb)  -Uterus (the womb)  -Fallopian Tubes (carry eggs to the womb)  -Ovaries (hold the eggs)    Who is at risk?  -All women have some risk.  Read this handout and learn more about:  your risk signs of cancer and ways of finding the cancer early    [place]    More women die of this cancer than from any other reproductive cancer.    You have MORE risk for cancer of the ovaries if:  -You are over 31 years of age  -52 in your family has had cancer of the ovaries or breast  -You have had breast cancer  -You have not had any children  -You have used hormones (for menopause) for over 10 years    You have LESS risk for cancer of the ovaries if:  -You have use the pill (birth control pills)  for more than 5 years  -You have breast fed your babies    WHAT YOU SHOULD LOOK FOR:  Many women have no symptoms.  Sometimes it is hard to tell that symptoms are related to the ovaries.  Some women have:  -Pain, a full feeling, or a lump in the belly that doesn't go away  -Bleeding from the vagina that is not normal  -Stomach problems that do not go away, like gas, nausea or discomfort in the lower belly  - Or pain when having sex    CANCER OF THE UTERUS    Cancer of the uterus is the most common women's reproductive cancer.  This cancer is also called endometrial cancer and it usually starts in the lining of the  womb.    You have MORE risk for uterine cancer if:  -You are over 42 years of age  -You have too much body fat, have diabetes or high blood pressure  -You are taking some forms of hormones for menopause  -You have not had children  -Your menopause started after age 61  -You take tamoxifen, or certain other treatments for breast cancer    WHAT YOU SHOULD LOOK FOR:  Some women have:  -Bleeding or discharge from the vaginal that is not normal  -A full feeling or cramps in the belly that does not go away  -Unexplained weight gain or weight loss    CANCER OF THE CERVIX    Cancer of the cervix can be prevented with regular PAP tests.  The PAP test can find cells that are not normal before they become cancer.    You have MORE risk for cervical cancer if:  -You do not get regular PAP smears and pelvic exams  -You or your sex partner have had many sex partners  -You  had sex at an early age  -You have had genital warts (HPV infection)  -You smoke cigarettes    WHAT YOU SHOULD LOOK FOR:  -Bleeding, spotting or discharge from the vagina that is not normal  -Bleeding after having sex  -Pain when having sex    LESS COMMON CANCERS    Cancers of the vagina, vulva and fallopian tubes are not at all common.    You have a RISK for these cancers if:  -You are over 45 years of age  -You have had cancer of the cervix, or other reproductive cancers  -You have had genital warts (also called HPV)    THE BEST WAY TO PROTECT YOURSELF IS TO FIND A CANCER EARLY!    What you can do to protect yourself:  -Get yearly pelvic exams  -Get PAP tests at appropriate intervals  -Talk to your health care provider about your personal risks  -Report any warning signs or changes you have noticed    Cancer of the ovaries is hard to find.  Talk to your provider about all of your symptoms, even if they do not seem to be related to your ovaries.  Your provider may offer special testing if you are determined to be at risk for ovarian cancer.    WHAT ELSE CAN YOU  DO TO STAY HEALTHY:  -Eat 5 servings of fruits and vegetables every day  -Exercise 30 minutes every day  -Stay at a healthy weight  -Practice safer sex:  use a condom.

## 2021-04-17 NOTE — Progress Notes (Signed)
Paula Daugherty is a 58yrfemale who presents with a chief complaint of "cruise got to be a little long".    Chief Complaint   Patient presents with    General Exam     CME       Recent back pain: after doing EGD and colonoscopy on same day; patient to return to clinic if symptoms continue   Adnexal cyst: seen by GYN: possible decrease in size; to recheck ultrasound in April   [facility]; mammo needed in April       ROS:   Constitutional: negative for fatigue, night sweats, malaise, anorexia, fever; slight increase after net decrease in weight.   Eyes: negative for blurry vision, double vision, visual loss, red eyes, photophobia, eye pain;seeing eye provider regularly   Ears, Nose, Mouth, Throat: negative for difficulty swallowing, persistent sore throat, hoarseness, dentures, post nasal drip, nosebleeds, rhinorrhea, loss of sense of smell, hearing loss, ear pain, tinnitus, discharge from ears, dental problem, change in taste, odynophagia.   CV: negative for palpitations, syncope, chest pain, paroxysmal nocturnal dyspnea, orthopnea, lower extremity edema, cyanosis, claudication.   Resp: negative for cough, sputum, hemoptysis, shortness of breath, excessive snoring, apnea spells while sleeping, pleuritic pain, wheezing, dyspnea on exertion.   GI: negative for vomiting, dysphagia, nausea, heartburn or reflux, early satiety, hematemesis, abdominal pain, excessive gas or bloating, change in caliber of stool, hemorrhoids, bleeding from rectum, melena, hematochezia, constipation, diarrhea, jaundice.   GU: negative for nocturia, dysuria, decreased force of stream, frequency, hesitancy, hematuria, retention, incontinence.   Musculoskeletal: negative for AM joint stiffness, joint swelling, joint pain, joint redness, muscle weakness, muscle pain, muscle cramping.   Integumentary: negative for moles that have changed, dark lesions, rash, itching, bruising, lumps or bumps, hair loss.    Neuro: negative for confusion, headaches, feel faint, seizures, vertigo, memory loss, paralysis/weakness, numbness or tingling, clumsiness, tremor, speech impairment.   Psych: Mood pt's report Negative for anhedonia, excessive guilt, depressed mood, crying spells, insomnia, early AM awakening, anxiety, impaired concentration, angry, suicidal ideation, abusive relationship.   Endo: negative for cold intolerance, heat intolerance, polyphagia, polydipsia, polyuria, hair loss, excessive hair growth.   Heme/Lymphatic: recent abnormal RDW and HCT outside of range; no bleeding disorder, abnormal bleeding, abnormal bruising, swollen nodes.   Allergy/Immun: negative for hay fever, itchy eyes, itchy nose, clear nasal secretions, rash.   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  Atorvastatin (LIPITOR) 10 mg Tablet Take 1 tablet by mouth every day at bedtime. 90 tablet 3    Empagliflozin (JARDIANCE) 25 mg Tablet Take 1 tablet by mouth every day. 90 tablet 3    Losartan (COZAAR) 25 mg Tablet Take 1 tablet by mouth every day. 90 tablet 3    Metformin (GLUCOPHAGE) 1,000 mg tablet Take 1 tablet by mouth 2 times daily with meals.  (diabetes) 180 tablet 3    NYSTATIN 100,000 unit/gram Powder APPLY TO THE AFFECTED AREA FOUR TIMES DAILY 30 g 2    Omeprazole (PRILOSEC) 20 mg Delayed Release Capsule TAKE 1 CAPSULE BY MOUTH TWICE DAILY BEFORE MEALS 180 capsule 0    Ondansetron (ZOFRAN-ODT) 8 mg disintegrating tablet Take 1 tablet by mouth every 8 hours if needed. 100 tablet 1    ONETOUCH ULTRA TEST Strips USE TO TEST BLOOD GLUCOSE TWICE DAILY. 200 strip 3    PARoxetine mesylate,menop.sym, (BRISDELLE) 7.5 mg Capsule Take 1 capsule by mouth every day. 90 capsule 3     No current facility-administered medications on file prior to visit.     Allergies:   Allergies   Allergen Reactions    Contrast Dye [Radiopaque Agent] Hives    Hydrochlorothiazide Other-Reaction in Comments     Patient reports    Januvia [Sitagliptin] Other-Reaction in Comments     Patient reports    Keflex [Cephalexin] Abdominal Pain    Lyrica [Pregabalin] Other-Reaction in Comments     Patient reports    Omnipaque [Iohexol] Other-Reaction in Comments     Patient reports    Prozac [Fluoxetine Hcl] Other-Reaction in Comments     Patient reports    Sulfa (Sulfonamide Antibiotics) Rash    Topiramate Other-Reaction in Comments     Hairfall       Social History     Socioeconomic History    Marital status: MARRIED    Number of children: 1   Occupational History    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 [employer]    Tobacco Use    Smoking status: Former     Packs/day: 0.10     Years: 20.00     Pack years: 2.00     Types: Cigarettes    Smokeless tobacco: Never    Tobacco comments:     quit 30 years ago   Substance and Sexual Activity    Alcohol use: Yes     Alcohol/week: 0.0 standard drinks     Comment: rare    Drug use: No    Sexual activity: Yes     Partners: Male     Family History   Problem Relation Name Age of Onset    Diabetes Father      Heart Mother          MI at 78s     Non-contributory Brother      Non-contributory Brother         PE:  BP 118/71 (SITE: right arm, Orthostatic  Position: sitting, Cuff Size: regular)   Pulse 66   Temp 36.2 C (97.1 F) (Temporal)   Resp 16   Ht 1.584 m (5' 2.36")   Wt 72.2 kg (159 lb 2.8 oz)   LMP 05/06/1983   BMI 28.78 kg/m   .General Appearance: healthy, alert, no distress, pleasant affect, cooperative.  Eyes:  conjunctivae and corneas clear. PERRL, EOM's intact. sclerae normal.  Ears:  normal TMs and canal and external inspection of ears show no abnormality.  Nose:  nasal  mucosa normal.  Mouth: good dentition; no lesions.  Neck:  Neck supple. No adenopathy, thyroid symmetric, normal size.  Heart:  normal rate and regular rhythm, no murmurs, clicks, or gallops.  Lungs: clear to auscultation.  Abdomen: BS normal.  Abdomen soft, non-tender.  No masses or organomegaly.  Extremities:  no cyanosis, clubbing, or edema.  Skin:  Skin color, texture, turgor normal. No rashes or suspicious lesions.  Neuro: grossly intact.  Musculoskeletal: normal tone  Breasts are symmetric.  No dominant, discrete, fixed  or suspicious masses are noted.  Mild symmetric fibrocystic densities are noted in both upper outer quadrants. No skin or nipple changes or axillary nodes. Self exam is taught and encouraged. Mammogram - is due in April and ordered today.      Assessment and Plan:  (E11.22,  N18.31,  Z79.4) Type 2 diabetes mellitus with stage 3a chronic kidney disease, with long-term current use of insulin (HCC)  (primary encounter diagnosis)  Comment: A1c at goal   Plan: CBC with Differential, Iron Total, Ferritin,         Transferrin, Reticulocyte Studies, Vitamin B12,        Folate, Comprehensive Metabolic Panel, TSH with        Free T4 Reflex          (N94.9) Adnexal cyst  Comment: possible decrease in size   Plan: due to recheck in April     (D12.6) Tubular adenoma of colon  Comment: per recent scope   Plan: recheck in 3 yrs       Total encounter time including history, physical examination, and coordination of care was approximately 41 minutes of which more than 50%  was spent counseling regarding assessment/diagnosis and treatment plan. No guarantees were made regarding her medical care or treatment outcome. Barriers to Learning: none.  Patient verbalizes understanding of teaching and instructions.    Electronically signed by:    [name], MD  [place], [place] [employer] of [employer]  Associate physician [facility], [place]   [phone]

## 2021-04-30 ENCOUNTER — Ambulatory Visit: Payer: Self-pay | Admitting: "Endocrinology

## 2021-05-01 ENCOUNTER — Ambulatory Visit: Payer: Self-pay | Admitting: Student in an Organized Health Care Education/Training Program

## 2021-05-07 ENCOUNTER — Ambulatory Visit: Payer: Medicare Other | Attending: Family Medicine

## 2021-05-07 DIAGNOSIS — Z794 Long term (current) use of insulin: Secondary | ICD-10-CM | POA: Insufficient documentation

## 2021-05-07 DIAGNOSIS — E1122 Type 2 diabetes mellitus with diabetic chronic kidney disease: Secondary | ICD-10-CM | POA: Insufficient documentation

## 2021-05-07 DIAGNOSIS — E1129 Type 2 diabetes mellitus with other diabetic kidney complication: Secondary | ICD-10-CM | POA: Insufficient documentation

## 2021-05-07 DIAGNOSIS — N1831 Chronic kidney disease, stage 3a: Secondary | ICD-10-CM

## 2021-05-07 LAB — COMPREHENSIVE METABOLIC PANEL
Alanine Transferase (ALT): 42 U/L — ABNORMAL HIGH (ref <=33)
Albumin: 4.5 g/dL (ref 4.0–4.9)
Alkaline Phosphatase (ALP): 144 U/L — ABNORMAL HIGH (ref 35–129)
Aspartate Transaminase (AST): 33 U/L (ref <=41)
Bilirubin Total: 1 mg/dL (ref <=1.2)
Calcium: 9.5 mg/dL (ref 8.6–10.0)
Carbon Dioxide Total: 29 mmol/L (ref 22–29)
Chloride: 103 mmol/L (ref 98–107)
Creatinine Serum: 0.96 mg/dL (ref 0.51–1.17)
E-GFR Creatinine (Female): 59 mL/min/{1.73_m2}
Glucose: 148 mg/dL — ABNORMAL HIGH (ref 74–109)
Potassium: 4.4 mmol/L (ref 3.4–5.1)
Protein: 8.1 g/dL (ref 6.6–8.7)
Sodium: 142 mmol/L (ref 136–145)
Urea Nitrogen, Blood (BUN): 20 mg/dL (ref 6–20)

## 2021-05-07 LAB — CBC WITH DIFFERENTIAL
Basophils % Auto: 0.9 %
Basophils Abs Auto: 0 10*3/uL (ref 0.0–0.2)
Eosinophils % Auto: 2.6 %
Eosinophils Abs Auto: 0.1 10*3/uL (ref 0.0–0.5)
Hematocrit: 39 % (ref 36.0–46.0)
Hemoglobin: 12.1 g/dL (ref 12.0–16.0)
Lymphocytes % Auto: 24.8 %
Lymphocytes Abs Auto: 1.2 10*3/uL (ref 1.0–4.8)
MCH: 24.5 pg — ABNORMAL LOW (ref 27.0–33.0)
MCHC: 31.1 % — ABNORMAL LOW (ref 32.0–36.0)
MCV: 78.8 fL — ABNORMAL LOW (ref 80.0–100.0)
MPV: 8.7 fL (ref 6.8–10.0)
Monocytes % Auto: 8.3 %
Monocytes Abs Auto: 0.4 10*3/uL (ref 0.1–0.8)
Neutrophils % Auto: 63.4 %
Neutrophils Abs Auto: 3.2 10*3/uL (ref 1.8–7.7)
Platelet Count: 172 10*3/uL (ref 130–400)
RDW: 16.2 % — ABNORMAL HIGH (ref 0.0–14.7)
Red Blood Cell Count: 4.94 10*6/uL (ref 4.00–5.20)
White Blood Cell Count: 5 10*3/uL (ref 4.5–11.0)

## 2021-05-07 LAB — RETICULOCYTE STUDIES
Corrected Retic Count: 1.2 % (ref 0.4–1.5)
Hematocrit: 39 % (ref 36.0–46.0)
Red Blood Cell Count: 4.94 10*6/uL (ref 4.00–5.20)
Reticulocyte Count %: 1.36 % (ref 0.40–2.40)
Reticulocyte Count Abs: 67.18 10*3/uL (ref 18.50–82.50)

## 2021-05-07 LAB — HEMOGLOBIN A1C
Hgb A1C,Glucose Est Avg: 146 mg/dL
Hgb A1C: 6.7 % — ABNORMAL HIGH (ref 3.9–5.6)

## 2021-05-07 LAB — TRANSFERRIN
Iron Percent Saturation: 14.5 % — ABNORMAL LOW (ref 15.0–50.0)
Iron Total: 58 ug/dL (ref 37–158)
Total Iron Binding Capacity: 399 ug/dL (ref 280–400)
Transferrin: 287 mg/dL (ref 200–360)

## 2021-05-07 LAB — FERRITIN: Ferritin: 15 ng/mL (ref 13–150)

## 2021-05-07 LAB — IRON TOTAL: Iron Total: 58 ug/dL (ref 37–158)

## 2021-05-07 LAB — VITAMIN B12: Vitamin B12: 691 pg/mL (ref 213–816)

## 2021-05-07 LAB — TSH WITH FREE T4 REFLEX: Thyroid Stimulating Hormone: 1.02 u[IU]/mL (ref 0.27–4.20)

## 2021-05-07 LAB — FOLATE: Folate: 18.6 ng/mL (ref >=7.0)

## 2021-05-14 ENCOUNTER — Ambulatory Visit: Payer: Medicare Other | Admitting: "Endocrinology

## 2021-05-14 DIAGNOSIS — Z7984 Long term (current) use of oral hypoglycemic drugs: Secondary | ICD-10-CM

## 2021-05-14 DIAGNOSIS — E119 Type 2 diabetes mellitus without complications: Secondary | ICD-10-CM

## 2021-05-14 DIAGNOSIS — E1129 Type 2 diabetes mellitus with other diabetic kidney complication: Secondary | ICD-10-CM

## 2021-05-14 DIAGNOSIS — R809 Proteinuria, unspecified: Secondary | ICD-10-CM

## 2021-05-14 DIAGNOSIS — E278 Other specified disorders of adrenal gland: Secondary | ICD-10-CM

## 2021-05-14 MED ORDER — ONETOUCH ULTRA TEST STRIPS: ORAL_STRIP | 3 refills | Status: AC

## 2021-05-14 MED ORDER — ATORVASTATIN 10 MG TABLET: 10.0000 mg | ORAL_TABLET | Freq: Every day | ORAL | 3 refills | Status: DC

## 2021-05-14 NOTE — Progress Notes (Signed)
Video Visit Note     I performed this clinical encounter by utilizing a real time telehealth video connection between my location and the patient's location. The patient's location was confirmed during this visit. I obtained verbal consent from the patient to perform this clinical encounter utilizing video and prepared the patient by answering any questions they had about the telehealth interaction.    I spent a total of 25 minutes today on this patient's visit.     Endocrinology Follow up clinic note:    Chief Complaint:  "I'm here to follow up on my diabetes."    HPI: Paula Daugherty is a 76yrold female who has a diagnosis of type 2 diabetes mellitus who presents to Endocrinology for follow up. Her history is as follows:  She was initially diagnosed with diabetes around 2000 on routine labs. She has a strong family history of diabetes so she wasn't surprised at this. She was started on Metformin around 2005 and then she was started on insulin in 2014. She was up to 64 units of Lantus at night. However, after making changes to her diet and lifestyle, she was taken off insulin in 2015. Basal insulin was added back to her regimen in 2018.      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     Interim History: She returns today for follow up. Her last visit with Endocrinology was on 01/29/2021. Since this visit, she has stopped her insulin and titrated down slowly on her Metformin dose. She is now taking Metformin 1000 mg every other day and has about 1 week of pills left and is then planning on stopping this medication completely if possible at that time.   She has been monitoring her feet at home and denies any issues.   Her blood glucose has been ranging from 135 to 160. She has not had any low blood glucose or symptoms of low blood sugar recently. Her blood pressure was 143/62 and heart rate was 74 this morning.     Current Regimen:              Metformin 1000 mg every other  day              Jardiance 25 mg daily  Hypoglycemia: None recently  Hyperglycemia: blood glucose is rarely >150     Checks blood glucose: 2x/day  Last eye exam: 12/2020    ROS:  All other systems were reviewed and are negatative except for pertinent positive and negative responses as documented in HPI.     Medications:  Medication reconciliation was performed today.   Current Outpatient Medications on File Prior to Visit   Medication Sig Dispense Refill    Amlodipine (NORVASC) 10 mg Tablet Take 1 tablet by mouth every day.      Empagliflozin (JARDIANCE) 25 mg Tablet Take 1 tablet by mouth every day. 90 tablet 3    Losartan (COZAAR) 25 mg Tablet Take 1 tablet by mouth every day. 90 tablet 3    Metformin (GLUCOPHAGE) 1,000 mg tablet Take 1 tablet by mouth 2 times daily with meals. (diabetes) 180 tablet 3    NYSTATIN 100,000 unit/gram Powder APPLY TO THE AFFECTED AREA FOUR TIMES DAILY 30 g 2    Omeprazole (PRILOSEC) 20 mg Delayed Release Capsule TAKE 1 CAPSULE BY MOUTH TWICE DAILY BEFORE MEALS 180 capsule 0    Ondansetron (ZOFRAN-ODT) 8 mg disintegrating tablet Take 1 tablet by mouth every 8  hours if needed. 100 tablet 1    PARoxetine mesylate,menop.sym, (BRISDELLE) 7.5 mg Capsule Take 1 capsule by mouth every day. 90 capsule 3     No current facility-administered medications on file prior to visit.     I did review patient's past medical and family/social history, no changes noted.   PMH:  Past medical history was reviewed from problem list.   Patient Active Problem List   Diagnosis    DM2 (diabetes mellitus, type 2) ([place])    Depression with anxiety    Stress at home    Leg cramps-right calf    Right ear pain    Fibromyalgia    HTN (hypertension)    GERD (gastroesophageal reflux disease)    Hyperlipidemia with target LDL less than 70    Vitamin D insufficiency    Abnormal LFTs    Renal cyst ( 6.4 cm cyst left kidney)    Cough    Fatty liver    Macroalbuminuric diabetic nephropathy (HCC)    History of actinic keratoses     Cataract    Ptosis of eyelid    Liver fibrosis    Adrenal nodule (HCC)    Medication Therapy Auth    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HCC)    Mixed conductive and sensorineural hearing loss of both ears    Neoplasm of uncertain behavior of skin    Abdominal pain, generalized    Nausea without vomiting    Asymptomatic microscopic hematuria    Subcutaneous nodule    Angina pectoris (HCC)    Mild episode of recurrent major depressive disorder (HCC)    NASH (nonalcoholic steatohepatitis)     VITAL SIGNS:  LMP 05/06/1983   There is no height or weight on file to calculate BMI.    PHYSICAL EXAM:  General Appearance: healthy, alert, no distress, pleasant affect, cooperative.  Eyes:  conjunctivae and corneas clear. EOM's intact. sclerae normal.  Lungs: Able to speak in full sentences with no audible wheezing.   Skin: Visible skin with no rashes or lesions appreciated.   Neuro: No tremor noted.  Mental Status: Appearance/Cooperation: in no apparent distress and well developed and well nourished  Eye Contact: normal  Speech: normal volume, rate, and pitch    LAB TESTS/STUDIES:   I personally reviewed the following  laboratory and imaging studies .   02/05/21 10:17 05/07/21 08:52   Sodium 140 142   Potassium 4.4 4.4   Chloride 102 103   Carbon Dioxide Total 27 29   Urea Nitrogen, Blood (BUN) 18 20   Creatinine Blood 0.99 0.96   Glucose 121 (H) 148 (H)   Calcium 9.6 9.5   Protein  8.1   Albumin  4.5   Alkaline Phosphatase (ALP)  144 (H)   Aspartate Transaminase (AST)  33   Bilirubin Total  1.0   Alanine Transferase (ALT)  42 (H)   E-GFR Creatinine (Female) 57 59   Cholesterol 158    Triglyceride 174 (H)    LDL Cholesterol Calculation 59    HDL Cholesterol 64    Non-HDL Cholesterol 94    Total Cholesterol:HDL Ratio 2.5    Iron Total  58  58   Transferrin  287   Total Iron Binding Capacity  399   Iron Percent Saturation  14.5 (L)   Ferritin  15   Folate  18.6   Thyroid Stimulating Hormone  1.02   Hgb A1C  6.7 (H)    Hgb A1C,Glucose Est Avg  146   Vitamin B12  691      05/07/21 08:52   White Blood Cell Count 5.0   Red Blood Cell Count 4.94  4.94   Hemoglobin 12.1   Hematocrit 39.0  39.0   MCV 78.8 (L)   MCH 24.5 (L)   MCHC 31.1 (L)   RDW 16.2 (H)   Platelet Count 172     04/12/2021:  Component Ref Range & Units    TOTAL VOLUME mL 1620    TIME OF COLLECTION hr 24    DOPAMINE,UR PER VOLUME ug/L 76    DOPAMINE,UR PER 24HR 71 - 485 ug/d 123    DOPAMINE,UR RATIO TO CRT 0 - 250 ug/g CRT 131    NOREPINEPHRINE,UR PER VOLUME ug/L 24    NOREPINEPHRINE,UR PER 24HR 14 - 120 ug/d 39    NOREPINEPHRINE,UR RATIO TO CRT 0 - 45 ug/g CRT 41    EPINEPHRINE,UR PER VOLUME ug/L 1    EPINEPHRINE,UR PER 24 HR 1 - 14 ug/d 2    EPINEPHRINE,UR RATIO TO CRT 0 - 20 ug/g CRT 2    CATECHOLAMINES INTERPRETATION  See Note    CREATININE,UR PER VOLUME mg/dL 58    CREATININE,UR PER 24HR 500 - 1400 mg/d 940         Component Ref Range & Units    TOTAL VOLUME mL 1620    TIME OF COLLECTION hr 24    CORTISOL,UR FREE PER VOLUME ug/L 18.00    CORTISOL,UR FREE PER 24HR <=45.0 ug/d 29.2    CORTISOL,UR FREE RATIO TO CRT ug/g CRT 31.58    Comment: Reference Interval: Cortisol ug/g crt      Component Ref Range & Units    Urine Volume mL 1,620    START DATE  04/11/2021    START TIME   7:30 AM    STOP DATE  04/12/2021    STOP TIME   7:30 AM    COLLECTION INTERVAL, URINE H 24.00    Creatinine Conc mg/dL 53.6    Creatinine 24 Hr 720 - 1,510 mg/24Hr 868      Component Ref Range & Units    TOTAL VOLUME mL 1620    TIME OF COLLECTION hr 24    METANEPHRINE,UR-PER VOLUME ug/L 65    METANEPHRINE,UR-PER 24HR 36 - 229 ug/d 105    METANEPHRINE,UR-RATIO TO CRT 0 - 300 ug/g CRT 112    NORMETANEPHRINE,UR-PER VOLUME ug/L 224    NORMETANEPHRINE,UR-PER 24HR 95 - 650 ug/d 363    NORMETANEPHRIN,UR-RATIO TO CRT 0 - 400 ug/g CRT 386    METANEPHRINES INTERPRETATION  See Note          02/05/21 10:17   Creatinine Spot Urine 90.5  90.5   Microalbumin Urine 6.8   Microalbumin/Creatinine Ratio 75     CT  Abdomen and Pelvis (08/22/2020):  FINDINGS:  Lower Chest: Unremarkable.  Liver: A few scattered small hypodensities too small to characterize. Mild  liver surface nodularity.  Bile Ducts: Unremarkable.  Gallbladder: Unremarkable.  Pancreas: Unremarkable.  Spleen: Unremarkable.  Adrenal Glands: Unremarkable.  Kidneys: Decreased size of left posterior renal cyst with some peripheral  calcifications.  GI Tract: Colonic diverticulosis without diverticulitis. Small soft tissue  nodule anterior to the descending colon previously had fat density and  therefore likely represents an small area of fat process or old torsed  epiploic appendage. Normal appendix.  Peritoneal Cavity: No free fluid or free air.  Bladder: Unremarkable.  Uterus and Ovaries: Status post hysterectomy. This  3.0 cm cystic lesion of  the left adnexa that is either new or increased from the prior. It does not  have simple fluid attenuation.  Lymph Nodes: Prominent upper abdominal lymph nodes are slightly increased  including a 1.7 lymph node anterior to the hepatic artery, previously 1.2  cm  Major Vascular Structures: There is atherosclerosis of the aorta and branch  vessels.  Soft Tissues: Unremarkable.  Musculoskeletal: Unremarkable.     IMPRESSION:  1. Diverticulosis without diverticulitis.  2. Increasing/new 3.0 cm nonsimple fluid cystic lesion of the left  adnexa. Recommend further evaluation with pelvic ultrasound.  3. Liver surface nodularity suggesting fibrosis.  4. Slight increased upper abdominal mild lymphadenopathy measuring up  to 1.7 cm is nonspecific. The slow growth argues against an aggressive  process. If patient proves to have chronic liver disease, mild upper  abdominal lymphadenopathy is often associated with this.    Impression: This is a 76yrold female with Diabetes Mellitus Type II who presents to Endocrinology for follow up. Overall, her glycemic control is at goal as evidenced by her most recent A1c  of 6.7%.  She reports well  controlled blood glucose at home with no hypoglycemia. Based on this, I have not recommended any changes to her current medications. Her current plan is to discontinue metformin when this prescription runs out and she was encouraged to contact me to report blood glucose trends after making this change.      She also has a history of an adrenal nodule. Hormonal evaluation was last done and was normal in 04/2021.      DIABETES HISTORY  (-) h/o retinopathy.      (-) h/o microalbuminuria/nephropathy.  (-) h/o neuropathy.  (-) h/o Autonomic dysfunction  (-) h/o Gastroparesis  (-) h/o CAD  (-) h/o PVD     Last eye exam: 10/2020  Last urine microalbumin: 02/2021, 75  Pneumovax: 12/2014, Prevnar 2018  Influenza vaccine: fall 2022  (-) ASA  (+) Statin  (+) ACE/ARB          Recommendations:  (E11.29) Type 2 diabetes mellitus with other diabetic kidney complication  (primary encounter diagnosis)  - Continue current dose of metformin, and Jardiance, will stop metformin when prescription runs out and contact me with blood glucose trends  - Encouraged dietary changes as the patient has been working on  - Encouraged patient to continue close blood glucose monitoring   - Last LDL at goal, continue statin, repeat due 02/2021  - Last microalbumin:creatinine ratio elevated, continue cozaar, repeat due 02/2022  - Up to date on screening eye exam  - Repeat A1c prior to follow up appointment  - Influenza vaccine and COVID-19 vaccinations up to date  - Discussed daily foot care     2. Adrenal nodule  - Biochemical evaluation normal in 04/2021, stability by imaging in 04/2017  - Hormonal evaluation has been normal x 5 years and may be discontinued at this time    Follow up in 3 months    If you have any questions, please do not hesitate to contact me at [phone]  Thank you for allowing me to participate in the care of this patient.    EDUCATION:  I educated/instructed the patient or caregiver regarding all aspects of the above stated  plan of care.  The patient or caregiver indicated understanding.      [place] was not used.    Report electronically signed by:  DSunday Spillers M.D.  Clinical Associate Professor  Department of Endocrinology

## 2021-05-21 ENCOUNTER — Encounter: Payer: Self-pay | Admitting: "Endocrinology

## 2021-05-21 ENCOUNTER — Encounter: Payer: Self-pay | Admitting: Obstetrics & Gynecology

## 2021-05-21 DIAGNOSIS — I10 Essential (primary) hypertension: Secondary | ICD-10-CM

## 2021-05-21 MED ORDER — AMLODIPINE 10 MG TABLET
10.0000 mg | ORAL_TABLET | Freq: Every day | ORAL | 3 refills | Status: DC
Start: 2021-05-21 — End: 2022-06-11

## 2021-05-21 NOTE — Addendum Note (Signed)
Addended by: Sunday Spillers on: 05/21/2021 04:30 PM     Modules accepted: Orders

## 2021-05-21 NOTE — Telephone Encounter (Signed)
From: [name]  To: Minna Merritts, MD  Sent: 05/19/2021 12:08 PM PST  Subject: Amlodipne Besylate 10 mg Rx    Hello Dr., I went to fill the above medication as was told that you had "canceled the Rx". Is that correct? I didn't remember [place] discussing stopping this but I am ok with doing that if you want. If this was not meant to be done [facility] says that they will need a new Rx to fill it again. Please let know what you prefer.   Takw care and as always thank you for your care.     [name]

## 2021-05-21 NOTE — Progress Notes (Addendum)
Hepatology Outpatient Progress Note    Patient Name: Paula Daugherty  MRN: [id number]  Date of Service: 05/22/2021  Referring Provider: [name], MD  Attending Physician: Georgeanne Nim MD, MPH  Fellow Physician: [name] Barcelona, MD    REASON FOR CONSULTATION: NASH and gastroparesis    PATIENT SUMMARY:  Paula Daugherty is a 76yrold female with history of DM and obesity who was found to have fatty liver on [place] without splenomegaly but with a liver fibrosis score of 12.9 kPa suggestive of severe (F3) fibrosis.  In addition, she has a history of duodenal carcinoid which was resected endoscopically with no evidence of residual disease on serial upper endoscopies.  She has also suffered from chronic nausea and vomiting, recently diagnosed with gastroparesis on GES.  She  had symptomatic relief from Reglan, but has since stopped it due to the risk of side effects, and maintained symptoms with Zofran PRN.      Since last clinic visit 10/24/2020, she underwent EGD and colonoscopy on 04/05/2021. EGD showed old D2 tattoo and no evidence of residual tumor on biopsy. Colo notable for 6 total polyps; two TA in the ascending colon (5-953m, at least 1 TA in the descending colon, and 2 hyperplastic rectal polyps, moderate diverticula of the sigmoid, and large prolapsing external hemorrhoids.     Today patient presents with her husband. She feels overall well. She reports new LLQ pain, two episodes, the first of which occurred several weeks ago, second episode occurred earlier this week. Initially will feel like she's developing a rash over the LLQ but no skin changes are appreciated. Then the sensation changes to feeling like there is something poking from the inside toward the outside. Not a sharp pain. Episodes each lasted multiple days, no precipitating or alleviating factors.   No vomiting over the last few months, which is dramatic improvement from prior almost daily nausea+vomiting episodes. No fever chills cough shortness  of breath chest pain or diarrhea.     Is working with endocrinologist Dr. ShRosary Livelyn DM2 management. A1c 6.7%. Is on Jardiance, tapering off Metformin, and uses insulin when she and her husband are traveling on cruises (9 cruises in last year).    PAST MEDICAL HISTORY:  Past Medical History:   Diagnosis Date    Angina pectoris ([place]/22/2019    Anxiety     Asthma     Cirrhosis ([facility]    Constipation, chronic     Depression     Diabetes mellitus ([place]    Fatty liver     Hypertension     Kidney disease 2015    cyst left kidney per pt    Liver fibrosis     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/20/2016    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[facility]12/14/2017    Psychiatric illness        ALLERGIES:    Contrast Dye [Radiopaque Agent]    Hives  Hydrochlorothiazide    Other-Reaction in Comments    Comment:Patient reports  Januvia [Sitagliptin]    Other-Reaction in Comments    Comment:Patient reports  Keflex [Cephalexin]    Abdominal Pain  Lyrica [Pregabalin]    Other-Reaction in Comments    Comment:Patient reports  Omnipaque [Iohexol]    Other-Reaction in Comments    Comment:Patient reports  Prozac [Fluoxetine Hcl]    Other-Reaction in Comments    Comment:Patient reports  Sulfa (Sulfonamide Antibiotics)    Rash  Topiramate    Other-Reaction  in Comments    Comment:Hairfall    MEDICATIONS:  Current Outpatient Medications on File Prior to Visit   Medication Sig Dispense Refill    Amlodipine (NORVASC) 10 mg Tablet Take 1 tablet by mouth every day. 90 tablet 3    Atorvastatin (LIPITOR) 10 mg Tablet Take 1 tablet by mouth every day at bedtime. 90 tablet 3    Empagliflozin (JARDIANCE) 25 mg Tablet Take 1 tablet by mouth every day. 90 tablet 3    Losartan (COZAAR) 25 mg Tablet Take 1 tablet by mouth every day. 90 tablet 3    Metformin (GLUCOPHAGE) 1,000 mg tablet Take 1 tablet by mouth 2 times daily with meals. (diabetes) 180 tablet 3    NYSTATIN 100,000 unit/gram Powder APPLY TO THE AFFECTED AREA FOUR TIMES DAILY 30 g  2    Omeprazole (PRILOSEC) 20 mg Delayed Release Capsule TAKE 1 CAPSULE BY MOUTH TWICE DAILY BEFORE MEALS 180 capsule 0    Ondansetron (ZOFRAN-ODT) 8 mg disintegrating tablet Take 1 tablet by mouth every 8 hours if needed. 100 tablet 1    ONETOUCH ULTRA TEST Strips USE TO TEST BLOOD GLUCOSE TWICE DAILY. 200 strip 3    PARoxetine mesylate,menop.sym, (BRISDELLE) 7.5 mg Capsule Take 1 capsule by mouth every day. 90 capsule 3     No current facility-administered medications on file prior to visit.       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    Tobacco Use    Smoking status: Former     Packs/day: 0.10     Years: 20.00     Pack years: 2.00     Types: Cigarettes    Smokeless tobacco: Never    Tobacco comments:     quit 30 years ago   Substance and Sexual Activity    Alcohol use: Yes     Alcohol/week: 0.0 standard drinks     Comment: rare    Drug use: No    Sexual activity: Yes     Partners: Male       REVIEW OF SYSTEMS:  As above per HPI    PHYSICAL EXAMINATION:  Temp: 36.3 C (97.3 F) (01/18 1358)  Temp src: Temporal (01/18 1358)  Pulse: 82 (01/18 1358)  BP: 124/78 (01/18 1358)  Resp: --  SpO2: --  Height: 157.5 cm (5' 2" ) (01/18 1358)  Weight: 73 kg (160 lb 15 oz) (01/18 1358)    General: Alert, cooperative, NAD  HEENT: NC/AT, EOMI, conjunctiva normal.   Pulm: no accessory muscle use  GI: Soft, non-tender, non-distended.   Skin: no jaundice, rashes or lesions  Neuro: Moving all 4 extremities spontaneously  Mental status: AAOx3      LABORATORY EXAMINATIONS:   Lab Results   Lab Name Value Date/Time    NA 142 05/07/2021 08:52 AM    NA 142 08/30/2015 08:35 AM    K 4.4 05/07/2021 08:52 AM    K  4.0 08/30/2015 08:35 AM    CL  103 05/07/2021 08:52 AM    CL 104 08/30/2015 08:35 AM    CO2 29 05/07/2021 08:52 AM    CO2 22 (L) 08/30/2015 08:35 AM    BUN 20 05/07/2021 08:52 AM    BUN 19 08/30/2015 08:35 AM    CR 0.96 05/07/2021 08:52 AM    CR 0.88 08/30/2015 08:35 AM    GLU 148 (H) 05/07/2021 08:52 AM    GLU 139 (H) 08/30/2015 08:35 AM     Lab Results   Lab Name Value Date/Time    WBC 5.0 05/07/2021 08:52 AM    WBC 8.9 08/30/2015 08:35 AM    HGB 12.1 05/07/2021 08:52 AM    HGB 14.4 08/30/2015 08:35 AM    HCT 39.0 05/07/2021 08:52 AM    HCT 39.0 05/07/2021 08:52 AM    HCT 44.6 08/30/2015 08:35 AM    PLT 172 05/07/2021 08:52 AM    PLT 217 08/30/2015 08:35 AM     Lab Results   Lab Name Value Date/Time    AST 33 05/07/2021 08:52 AM    AST 71 (H) 08/30/2015 08:35 AM    ALT 42 (H) 05/07/2021 08:52 AM    ALT 69 (H) 08/30/2015 08:35 AM    ALP 144 (H) 05/07/2021 08:52 AM    ALP 104 08/30/2015 08:35 AM    ALB 4.5 05/07/2021 08:52 AM    ALB 4.2 08/30/2015 08:35 AM    TP 8.1 05/07/2021 08:52 AM    TP 8.5 (H) 08/30/2015 08:35 AM    TBIL 1.0 05/07/2021 08:52 AM    TBIL 0.9 08/30/2015 08:35 AM     Lab Results   Component Value Date    INR 1.00 01/08/2016     Lab Results   Component Value Date    VITD25 34.4 03/17/2016       VIRAL SEROLOGIES:  No results found for this or any previous visit.  Lab Results   Component Value Date    HBSAG Nonreactive 12/08/2013     Lab Results   Component Value Date    HEPBABQT 153.46 12/08/2013     No results found for: ANTIHBCT  No results found for this or any previous visit.  No results found for this or any previous visit.  No results found for this or any previous visit.    Lab Results   Component Value Date    HEPCABSCR Nonreactive 12/08/2013     No results found for this or any previous visit.  No results found for this or any previous visit.  No results found for: HIV1AND2SCR    AUTOIMMUNE:  No results found for: ANASCREEN  No results found for: IGG  No results found for this or any  previous visit.  No results found for this or any previous visit.    OTHER:  Lab Results   Component Value Date    FRTN 15 05/07/2021     No results found for this or any previous visit.  No results found for this or any previous visit.    EGD and colonoscopy 04/05/2021. EGD showed old D2 tattoo and no evidence of residual neuroendocrine tumor on biopsy. Colo notable for 6 total polyps; two TA in the ascending colon (5-43m), at least 1 TA in the descending colon (<532m, 2 hyperplastic rectal polyps (<39m74m moderate diverticula of the sigmoid, and large prolapsing external hemorrhoids.     CT AP 08/2020  Liver with few scattered small hypodensities too small to characterize. Mild liver surface  nodularity suggesting fibrosis. No splenomegaly or evidence of portosystemic shunting.      Assessment and Plan:  1. NASH: based on Fibroscan 2017, she has F3 disease.  She will continue modify her diet and meets regularly with a dietician to discuss. ALT still slightly elevated at 42. Other LFTs improved including AST 33, ALP 144. Continue to work with endocrinologist and PCP on DM2 and HLD management.   - Repeat CBC, CMP, and INR in 3-6 months    2. Gastroparesis confirmed by gastric emptying study 06/2016: she had symptomatic relief using Reglan, but we cannot continue this long term because of the risk for tardive dyskinesia; is using it sparingly. Overall she is managing fine with zofran as needed for nausea.  Have discussed gastric pacemaker but has not felt that her symptoms were bad enough to warrant this referral.   - Miralax or fiber supplement to help facilitate stool passage (constipation could be contributing to LLQ pain)    3. Carcinoid: no evidence of residual disease on EGD, last 04/2021. DOTATATE and gastrin normal in 2017/2018, no evidence for metastatic disease or ZES. Her ongoing nausea and vomiting are more likely related to gastroparesis from T2DM. Now 5 years out from initial diagnosis, will space out EGD  surveillance to 2 years.   - Next EGD in 04/2023    4. CRC screening: 2022 colo with x3-4 TAs, due again in 3 years.   - Next colonoscopy in 04/2024    Evaluated with attending Dr. Anselm Pancoast, MD  [facility]  Gastroenterology/Hepatology Fellow    Total time I spent in care of this patient today (excluding time spent on other billable services) was 30 minutes.    Attending attestation: I saw and evaluated the patient.  I agree with the findings and the plan of care as documented in the fellow's note.    Georgeanne Nim MD, MPH  Division of Gastroenterology and Hepatology    Number and Complexity of Problems Addressed  2 or more stable chronic illnesses  1 acute, uncomplicated illness or injury    Amount and/or Complexity of Data to be Reviewed and Analyzed  3+ review of the results of each unique test  3+ unique tests ordered    Risk of Complications and/or Morbidity or Mortality of Patient Management  Minimal

## 2021-05-21 NOTE — Telephone Encounter (Signed)
From: [name]  To: Leta Speller, MD  Sent: 05/19/2021 12:17 PM PST  Subject: Paroxetine Capsules 7.5 mg     Hello Dr., I want to share that I have been on the above medication for about 2 months now. I have seen no changes for me. I continue to have the hot flashes. I have about 5 weeks of pills left. I am scheduled to be seen in GYN again March 14th to evaluate this treatment. I would have to get the medication refilled to have enough until then. I hate to refill it since I've seen no relief and also in case the plan is to stop the medication.   Looking for advice on how you would like me to proceed. Thank you for your help.

## 2021-05-22 ENCOUNTER — Ambulatory Visit
Payer: Medicare Other | Attending: GASTROENTEROLOGY | Admitting: Student in an Organized Health Care Education/Training Program

## 2021-05-22 ENCOUNTER — Encounter: Payer: Self-pay | Admitting: Student in an Organized Health Care Education/Training Program

## 2021-05-22 VITALS — BP 124/78 | HR 82 | Temp 97.3°F | Ht 62.0 in | Wt 160.9 lb

## 2021-05-22 DIAGNOSIS — K7581 Nonalcoholic steatohepatitis (NASH): Secondary | ICD-10-CM | POA: Insufficient documentation

## 2021-05-22 DIAGNOSIS — R1032 Left lower quadrant pain: Secondary | ICD-10-CM

## 2021-05-22 DIAGNOSIS — Z85068 Personal history of other malignant neoplasm of small intestine: Secondary | ICD-10-CM | POA: Insufficient documentation

## 2021-05-22 NOTE — Progress Notes (Signed)
Patient WAS wearing a surgical mask  Droplet precautions were followed when caring for the patient.   PPE used by provider during encounter: Surgical mask      Medication list given at the time of check in for review by patient, instructed patient to indicate to the doctor any corrections that need to be made for Medication Reconciliation.    Patient identifiers obtained. vital signs taken, screened for pain, allergies checked, patient roomed, and [name] Barcelona, MD notified.     Cherre Huger, MA

## 2021-05-22 NOTE — Addendum Note (Signed)
Addended by: Virgel Bouquet Tavien Chestnut on: 05/22/2021 08:48 PM     Modules accepted: Level of Service

## 2021-05-22 NOTE — Patient Instructions (Addendum)
-   Recommend starting either fiber supplement or Miralax to ease stool passage and possibly help with left lower abdomen pain.  - Next upper endoscopy will be in 04/2023  - Next colonoscopy will be in 04/2024  - Continue Zofran as prescribed  - Recheck liver labs in 3-6 months

## 2021-06-13 ENCOUNTER — Ambulatory Visit: Payer: Medicare Other | Admitting: Obstetrics & Gynecology

## 2021-06-26 ENCOUNTER — Telehealth: Payer: Self-pay | Admitting: Family Medicine

## 2021-06-26 NOTE — Telephone Encounter (Signed)
Paula Daugherty is a 75yrold female  3 patient identifiers used.  Per:   patient.    Disposition: Advice given per Clear Triage protocol  Appointment given per Clear Triage protocol  Per:   patient verbalizes agreement to plan. Agrees to callback with any increase in symptoms/concerns or questions.    See Assessment Below  ClearTriage Note:     2/16 symptom onset of a cough. COVID negative yesterday after returning home from cruise. Productive cough with clear to now yellow mucus.  Typically will get bronchitis or pneumonia. Wanting to be evaluated. No OV available today, booked next day in conjunction with her husband's [place] OV's as well.     Noted that her blood sugars have been elevated, as well. She says that she is in the process of notifying her Endocrinologist. Says "we are close and discuss on my Chart often about my blood sugar regimen".     Protocol Used: Cough - Acute Productive (Adult)  Protocol-Based Disposition: See HCP Within 4 Hours (or PCP Triage or Video Visit)    Video visit offer not recorded    Positive Triage Question:  * Wheezing is present    Care Advice Discussed:  * Reasons To Call Back      - You become worse    Negative Triage Questions:  * SEVERE difficulty breathing (e.g., struggling for each breath, speaks in single words)  * Bluish (or gray) lips or face now  * [1] Difficulty breathing AND [2] exposure to flames, smoke, or fumes  * [1] Stridor AND [2] difficulty breathing  * Sounds like a life-threatening emergency to the triager  * [1] MODERATE difficulty breathing (e.g., speaks in phrases, SOB even at rest, pulse 100-120) AND [2] still present when not coughing  * Chest pain  (Exception: MILD central chest pain, present only when coughing)  * Patient sounds very sick or weak to the triager  * [1] MILD difficulty breathing (e.g., minimal/no SOB at rest, SOB with walking, pulse < 100) AND [2] still present when not coughing  * [1] Coughed up blood AND [2] > 1 tablespoon (15 ml)   (Exception: Blood-tinged sputum.)  * Fever > 103 F (39.4 C)  * [1] Fever > 101 F (38.3 C) AND [2] age > [age]years  * [1] Fever > 100.0 F (37.8 C) AND [2] bedridden (e.g., nursing home patient, CVA, chronic illness, recovering from surgery)  * [1] Fever > 100.0 F (37.8 C) AND [2] diabetes mellitus or weak immune system (e.g., HIV positive, cancer chemo, splenectomy, organ transplant, chronic steroids)  * SEVERE coughing spells (e.g., whooping sound after coughing, vomiting after coughing)  * [1] Continuous (nonstop) coughing interferes with work or school AND [2] no improvement using cough treatment per Care Advice  * Coughing up rusty-colored (reddish-brown) sputum  * Fever present > 3 days (72 hours)  * [1] Fever returns after gone for over 24 hours AND [2] symptoms worse or not improved    MLorenda Hatchet BSN RN  PCN-Nurse Triage

## 2021-06-27 ENCOUNTER — Ambulatory Visit: Payer: Medicare Other | Admitting: Family Medicine

## 2021-06-27 ENCOUNTER — Encounter: Payer: Self-pay | Admitting: Family Medicine

## 2021-06-27 VITALS — BP 126/74 | HR 78 | Temp 97.6°F | Wt 159.8 lb

## 2021-06-27 DIAGNOSIS — J069 Acute upper respiratory infection, unspecified: Secondary | ICD-10-CM

## 2021-06-27 DIAGNOSIS — J4520 Mild intermittent asthma, uncomplicated: Secondary | ICD-10-CM

## 2021-06-27 MED ORDER — ALBUTEROL SULFATE HFA 90 MCG/ACTUATION AEROSOL INHALER: 1.0000 | INHALATION_SPRAY | Freq: Four times a day (QID) | RESPIRATORY_TRACT | 3 refills | Status: DC | PRN

## 2021-06-27 MED ORDER — BENZONATATE 100 MG CAPSULE
100.0000 mg | ORAL_CAPSULE | Freq: Three times a day (TID) | ORAL | 1 refills | Status: AC | PRN
Start: 2021-06-27 — End: 2021-07-27

## 2021-06-27 NOTE — Progress Notes (Signed)
Visit Date: 06/28/2021  Last seen 12/07/2020 with me    Follow-up visit for Paula Daugherty (DOB: 16-Dec-1945), a 76yrold female.      This patient is accompanied in the office by her husband    Chief complaint:  Lesion on the back     History obtained from: Patient    Assessment/ Plan     # Irritated seborrheic keratosis  HPI: Growth present for several weeks with associated itching and irritation on the back. Patient reports extreme discomfort because these lesions frequently rubs against her bra. Patient denies any bleeding and there has been no history of treatment.   Exam:   Hyperkeratotic papules with evidence of excoriation of the right mid back x6  Plan:   - Given the lesions are symptomatic, offer LN2 treatment for symptomatic relief  CRYOSURGERY PROCEDURE NOTE  - After obtaining verbal consent 6 lesion(s) were treated with liquid nitrogen with a 10 second thaw time. The risks, including pain, blister formation, infection, hypo/hyperpigmentation, scar, recurrence, and the need for further treatment were discussed with the patient and informed consent was obtained verbally. The patient tolerated the treatment well and knows to return if the lesions do not resolve.   -  Patient informed that repeat treatment may be required.       # Irritated Acrochordon  HPI: The patient is concerned about lesion near the left eye for several weeks. There has been some increase in size. The patient states that it is bothersome to her. The patient denies any history of treatment.  Exam:   Pink, <1-255mflesh-colored pedunculated papule of the left periorbital area  Plan:    - Given the lesions are symptomatic, offer LN2 treatment for symptomatic relief  CRYOSURGERY PROCEDURE NOTE  - After obtaining verbal consent 1 lesion(s) were treated with liquid nitrogen with a 10 second thaw time. The risks, including pain, blister formation, infection, hypo/hyperpigmentation, scar, recurrence, and the need for further treatment were discussed  with the patient and informed consent was obtained verbally. The patient tolerated the treatment well and knows to return if the lesions do not resolve.       Return Visit: PRN        Problem List:     Skin Cancer History:  None     Family History:  Negative for Melanoma     Patient Active Problem List    Diagnosis Date Noted    Adrenal nodule ([facility]08/26/2017     Priority: High     Class: Acute     left      NASH (nonalcoholic steatohepatitis) 08/10/2018    Mild episode of recurrent major depressive disorder ([facility]01/12/2018    Angina pectoris ([facility]05/22/2019    Subcutaneous nodule 07/14/2017    Asymptomatic microscopic hematuria 03/08/2017     7 RBCs; cs consistent with normal flora 03/08/2017       Abdominal pain, generalized 12/07/2016    Nausea without vomiting 12/07/2016    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/20/2016    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HCP[facility] [place]12/14/2017    Medication Therapy Auth 03/19/2016     PA for Ondansetron (ZOFRAN ODT) 8 mg disintegrating tablet to [facility] ID# [id number] until 04/18/16  PA submitted for Ondansetron (ZOFRAN, AS HYDROCHLORIDE,) 8 mg Tablet to [facility] ID# [id number] Approved utnil 06/08/17  PA for Omeprazole (PRILOSEC) 20 mg Delayed Release Capsule  60/month to [facility] ID# [id number] Approved until 02/02/2022  04/06/2020 PA submitted for Ondansetron (ZOFRAN-ODT) 4 mg disintegrating tablet 50/17 DS to [facility] ID# [id number]  EPA- approved through 05/07/2020.      Liver fibrosis 12/12/2015    Ptosis of eyelid 08/06/2015    Cataract 12/18/2014    History of actinic keratoses 12/06/2014    Macroalbuminuric diabetic nephropathy ([place]) 09/12/2014    Fatty liver 08/28/2014     Possible portal HTN--hepatology referral placed 08/28/2014.       Cough 12/14/2013    Renal cyst ( 6.4 cm cyst left kidney) 08/24/2013    Hyperlipidemia  with target LDL less than 70 07/28/2013    Vitamin D insufficiency 07/28/2013    Abnormal LFTs 07/28/2013    DM2 (diabetes mellitus, type 2) ([place]) 07/11/2013     10/15 Diabetes- Dining with Diabetes: Basics  03/10/2014 Diabetes - Recharge Workshop      Depression with anxiety 07/11/2013    Stress at home 07/11/2013    Leg cramps-right calf 07/11/2013    Right ear pain 07/11/2013    Fibromyalgia 07/11/2013    HTN (hypertension) 07/11/2013    GERD (gastroesophageal reflux disease) 07/11/2013        Meds:  List in EMR viewed today    Allergies:    List in EMR viewed today    Labs Reviewed:  None    Records Reviewed:   None    Areas Examined:  face, back, patient declined exam of other areas    Patient Protective Equipment (PPE) Used:  Patient WAS wearing a surgical mask and removed with facial exam   Contact precautions were followed when caring for the patient.   PPE used by provider during encounter: Surgical mask and face shield/ goggles     SCRIBE STATEMENT  I, Kendall West,  am personally taking down the notes in the presence of Beatris Si, PA-C  Electronically signed by Patria Mane, [place], 06/28/2021  10:44 AM       SCRIBE Marica Otter  I, Beatris Si, PA-C, personally performed the services described in this documentation, as scribed by the trained medical scribe above in my presence, and it is both accurate and complete.     Beatris Si, PA-C  Physician Assistant, Department of Dermatology  Electronically signed by Beatris Si, PA-C  Supervising attending: Gerlean Ren, MD    Barriers to Learning assessed: none.  Patient verbalizes understanding of teaching and instructions.  The patient was instructed in all aspects of care and expressed comprehension.      -----------------------------------------------------------------------------  If you are reviewing this progress note and have questions about the meaning or medical terms being used, please schedule an appointment or send a message via the [facility]  system.  Medical notes are a communication tool between medical professionals and require medical terms to be used for efficiency. You can also look up terms via on-line medical dictionaries such as Medline Plus at [employer]. If you have any further questions about the medical records, please contact [facility] [employer] at [phone] or by e-mail at  [email or web address].

## 2021-06-27 NOTE — Progress Notes (Signed)
HISTORY   Paula Daugherty is a very pleasant 48yrfemale who presents with a chief complaint of "cough"..   PCP: JJamelle Haring MD.    [name] here today for:  She has concerns regarding upper respiratory infection symptoms   Recent history: cough productive of discolored sputum, chest congestion, tiredness, and low grade fevers.    She denies shaking chills, stiff neck, significant dyspnea, chest pain, or confusion.   Duration: 7 days.   Worsening: improving, but wanted to be checked.   Home covid-19 test yesterday - negative.   Just got back from a cruise.   Diabetes control has not been good lately, likely related to the upper respiratory infection.     Symptomatic treatment attempted:  Y   No  [x]  []  Antipyretic: ibuprofen   []  [x]  Decongestant:  sudafed  [x]  []  cough suppressant: mucinex DM  []  [x]  Antihistamine:   []  [x]  Inhalers       Symptoms Check:   Y   No  []  [x]  Fever   []  [x]  Chills  []  [x]  Malaise  []  [x]  Myalgia  []  [x]  Sore Throat  []  [x]  Anosmia  []  [x]  Dyspnea   [x]  []  Cough   []  [x]  Headache   [x]  []  Rhinorrhea -improving  [x]  []  Congestion/sinus -improving  []  [x]  Conjunctivitis          Patient Active Problem List   Diagnosis    DM2 (diabetes mellitus, type 2) (HCC)    Depression with anxiety    Stress at home    Leg cramps-right calf    Right ear pain    Fibromyalgia    HTN (hypertension)    GERD (gastroesophageal reflux disease)    Hyperlipidemia with target LDL less than 70    Vitamin D insufficiency    Abnormal LFTs    Renal cyst ( 6.4 cm cyst left kidney)    Cough    Fatty liver    Macroalbuminuric diabetic nephropathy (HCC)    History of actinic keratoses    Cataract    Ptosis of eyelid    Liver fibrosis    Adrenal nodule (HCC)    Medication Therapy Auth    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HCC)    Mixed conductive and sensorineural hearing loss of both ears    Neoplasm of uncertain behavior of skin    Abdominal pain, generalized     Nausea without vomiting    Asymptomatic microscopic hematuria    Subcutaneous nodule    Angina pectoris (HCC)    Mild episode of recurrent major depressive disorder (HCC)    NASH (nonalcoholic steatohepatitis)       Allergies to medications: reviewed and updated  Medications: reviewed and updated    Social History  Social History     Tobacco Use    Smoking status: Former     Packs/day: 0.10     Years: 20.00     Pack years: 2.00     Types: Cigarettes    Smokeless tobacco: Never    Tobacco comments:     quit 30 years ago   Substance Use Topics    Alcohol use: Yes     Alcohol/week: 0.0 standard drinks     Comment: rare    Drug use: No         Review of Systems:    General: some tiredness, weight stable, no significant changes in appetite.  Head: some sinus headaches, no dizziness   Lungs: no  dyspnea.   GI: no associated vomiting. No new diarrhea.  Skin: no new rashes  GU: no dysuria      PHYSICAL   BP 126/74 (SITE: right arm, Orthostatic Position: sitting, Cuff Size: large)   Pulse 78   Temp 36.4 C (97.6 F)   Wt 72.5 kg (159 lb 13.3 oz)   LMP 05/06/1983   BMI 29.23 kg/m   She appears well, in no apparent distress.  Alert, pleasant and cooperative.  Mood: Good range of affect. Well dressed. No display of melancholy.   Eyes: no conjunctivitis  Ears: normal  Nose:  normal  Oropharynx: benign  Neck: supple no adenopathy  Chest:  slight wheeze with coughing.   Skin: no rash       DATA   Detailed chart review was provided.  Review was helpful in medical decision making.       ASSESSMENT AND PLAN       Routine medication risks, complications, and contraindications reviewed.  Ms. Demuro understands and accepts the medication(s).  Call or return to clinic as needed if these symptoms worsen or fail to improve as anticipated.     Barriers to learning assessed: none.    Ms. Kassel was given the opportunity to ask questions and these were addressed today. She verbalized understanding of teaching and instructions.    No  guarantees were made regarding medical care or treatment outcome.    I reviewed pertinent past medical, family/social and medication history during the visit.               Electronically signed by:  Rayburn Felt, M.D.  Diplomate, [employer] of [employer], [place]    For Ms. Lodes:  Medical notes are meant to convey information between members of our health care team. Therefore these notes require medical terminology for precision and efficiency.   Abbreviations may also be used. Choice of words might differ from common language due to nuances of meaning specific to healthcare.  If you have questions or concerns about this note, please schedule an appointment and we can discuss any concerns at your convenience. Alteration of the medical record cannot be guaranteed.     This note was created using Systems analyst. Some minor dictation errors may be present despite editing and proofreading. Grammatical, sound-alike, or syntax errors are likely dictation errors.         PPE Used During the Visit:  Eye and Face Protection: Body Protection: Hand Protection:   [x] Glasses/Goggles    [x] Face Shield  [x] Surgical Mask    [] N-95 Mask     [] Particulate Respirator  [] Half/Full Face Elastomeric Respirator  [] PAPR  [] Gown [x] Gloves

## 2021-06-27 NOTE — Nursing Note (Signed)
PPE Used During the Visit:    Eye and Face Protection:          Body Protection:   []Glasses/Goggles    []Gown   []Face Shield   [x]Surgical Mask   Hand Protection:   []N-95 Mask     []Gloves   []Particulate Respirator   []Half/Full Face Elastomeric Respirator   []PAPR    Patient:    [x]Surgical Mask

## 2021-06-28 ENCOUNTER — Ambulatory Visit: Payer: Medicare Other | Admitting: Physician Assistant

## 2021-06-28 ENCOUNTER — Encounter: Payer: Self-pay | Admitting: Physician Assistant

## 2021-06-28 DIAGNOSIS — L820 Inflamed seborrheic keratosis: Secondary | ICD-10-CM

## 2021-06-28 DIAGNOSIS — R208 Other disturbances of skin sensation: Secondary | ICD-10-CM

## 2021-06-28 DIAGNOSIS — L919 Hypertrophic disorder of the skin, unspecified: Secondary | ICD-10-CM

## 2021-06-28 NOTE — Nursing Note (Signed)
Patient was screened for allergies, tobacco usage and pain level, but no vitals taken per clinician's request.         PPE Used During the Visit:  Eye and Face Protection: Body Protection: Hand Protection:   [x]Glasses/Goggles                                    [x]Face Shield  [x]Surgical Mask                          []N-95 Mask                           []Particulate Respirator  []Half/Full Face Elastomeric Respirator  []PAPR                  []Gown []Gloves

## 2021-06-28 NOTE — Patient Instructions (Signed)
Follow-up: As needed     Dr. Sharol Roussel- Dermatology [facility])  [place], [place] [address]  [phone]

## 2021-07-12 ENCOUNTER — Encounter: Payer: Self-pay | Admitting: Neurology

## 2021-07-12 ENCOUNTER — Ambulatory Visit: Payer: Medicare Other | Admitting: Neurology

## 2021-07-12 VITALS — BP 137/80 | HR 74 | Temp 97.1°F | Ht 62.0 in | Wt 162.0 lb

## 2021-07-12 DIAGNOSIS — M5412 Radiculopathy, cervical region: Secondary | ICD-10-CM

## 2021-07-12 DIAGNOSIS — G44219 Episodic tension-type headache, not intractable: Secondary | ICD-10-CM

## 2021-07-12 DIAGNOSIS — Z8669 Personal history of other diseases of the nervous system and sense organs: Secondary | ICD-10-CM

## 2021-07-12 NOTE — Nursing Note (Signed)
Identify by two identifiers vitals taken, allergies and pain level verified, pharmacy updated.       Patient WAS wearing a surgical mask  Contact and Droplet precautions were followed when caring for the patient.   PPE used by provider during encounter: Surgical mask  Marelin Tat MA

## 2021-07-13 MED ORDER — AMITRIPTYLINE 10 MG TABLET
10.0000 mg | ORAL_TABLET | Freq: Every day | ORAL | 11 refills | Status: DC
Start: 2021-07-13 — End: 2021-12-13

## 2021-07-13 MED ORDER — NURTEC ODT 75 MG DISINTEGRATING TABLET
75.0000 mg | DISINTEGRATING_TABLET | ORAL | 3 refills | Status: DC | PRN
Start: 2021-07-13 — End: 2022-04-17
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
## 2021-07-16 ENCOUNTER — Encounter: Payer: Self-pay | Admitting: Family Medicine

## 2021-07-16 ENCOUNTER — Ambulatory Visit: Payer: Medicare Other | Admitting: Obstetrics & Gynecology

## 2021-07-16 ENCOUNTER — Encounter: Payer: Self-pay | Admitting: Obstetrics & Gynecology

## 2021-07-16 ENCOUNTER — Ambulatory Visit (INDEPENDENT_AMBULATORY_CARE_PROVIDER_SITE_OTHER): Payer: Medicare Other | Admitting: Family Medicine

## 2021-07-16 VITALS — BP 130/77 | HR 66 | Temp 97.3°F | Resp 16 | Wt 164.7 lb

## 2021-07-16 VITALS — BP 130/77 | HR 66 | Resp 18 | Wt 164.0 lb

## 2021-07-16 DIAGNOSIS — N83202 Unspecified ovarian cyst, left side: Secondary | ICD-10-CM

## 2021-07-16 DIAGNOSIS — E1122 Type 2 diabetes mellitus with diabetic chronic kidney disease: Secondary | ICD-10-CM

## 2021-07-16 DIAGNOSIS — Z794 Long term (current) use of insulin: Secondary | ICD-10-CM

## 2021-07-16 DIAGNOSIS — N951 Menopausal and female climacteric states: Secondary | ICD-10-CM

## 2021-07-16 DIAGNOSIS — R232 Flushing: Secondary | ICD-10-CM

## 2021-07-16 DIAGNOSIS — I129 Hypertensive chronic kidney disease with stage 1 through stage 4 chronic kidney disease, or unspecified chronic kidney disease: Secondary | ICD-10-CM

## 2021-07-16 DIAGNOSIS — N1831 Chronic kidney disease, stage 3a: Secondary | ICD-10-CM

## 2021-07-16 DIAGNOSIS — R748 Abnormal levels of other serum enzymes: Secondary | ICD-10-CM

## 2021-07-16 DIAGNOSIS — J069 Acute upper respiratory infection, unspecified: Secondary | ICD-10-CM

## 2021-07-16 DIAGNOSIS — I10 Essential (primary) hypertension: Secondary | ICD-10-CM

## 2021-07-16 MED ORDER — CLONIDINE HCL 0.1 MG TABLET
0.1000 mg | ORAL_TABLET | Freq: Every day | ORAL | 1 refills | Status: DC
Start: 2021-07-16 — End: 2021-12-26

## 2021-07-16 NOTE — Telephone Encounter (Signed)
Blood sugars included from pt for MD review      Birt Reinoso, RN  Please Route to P IM TRIAGE POOL if further action required.

## 2021-07-16 NOTE — Progress Notes (Signed)
Gyn Note    Chief Complaint   Patient presents with    Follow Up With Specialist         ASSESSMENT & PLAN      Diagnoses and all orders for this visit:  Vasomotor symptoms due to menopause       Still has disruptive HF and temp changes that get in the way of daily activities and affects quality of life.     - will try clonidine for now (per Dr. Gerarda Fraction note she may have tried and failed this in the past, but patient cannot be sure today as did not does not remember names of medications perfectly).  Will let me know if no improvement after 4 weeks of use.  Watch blood pressure closely during first few days.  --- If no improvement or relief of vasomotor symptoms clonidine, then plan to trial [name] (versus gabapentin) as next step.  Of note, did very poorly with use of Prozac in the past will not order this.  --- Ultimately reviewed that if no nonhormonal therapy results in improvement of quality of life then ultimately we could discuss hormone therapy, however there would be considerable risk related to this and I would exhaust all nonhormonal options prior to discussing this.  Patient agrees.  Would need to confirm no contraindications    - has repeat US next month  to FU adnexal cyst.     - FU in 37mfor med management      SUBJECTIVE     Paula Daugherty a 89yr1 P1. She had concerns including Follow Up With Specialist.    Here for vasomotor sx's/ med management.   Use paroxetine x3m79mo improvement at all in HF.   Saw PCP and ordered to try  Used prozac in past for mood sx's and did very poorly with SI.       OB History   Gravida Para Term Preterm AB Living   1 1       1    SAB IAB Ectopic Multiple Live Births                  # Outcome Date GA Lbr Len/2nd Weight Sex Delivery Anes PTL Lv   1 Para                - PMH, PSH, FH, SH reviewed in chart.         OBJECTIVE       BP 130/77   Pulse 66   Resp 18   Wt 74.4 kg (164 lb)   LMP 05/06/1983   BMI 30.00 kg/m?       Gen- NAD      - labs reviewed in chart and notable  for:  TSH 1.02          Total time I spent in care of this patient today (excluding time spent on other billable services) was 30 minutes.       M PNolon RodD   Assistant Clinical Professor  Obstetrics/Gynecology  PI# [id number]

## 2021-07-16 NOTE — Patient Instructions (Signed)
A CT scan has been ordered for you.  The referral has been sent to our referral coordinators who will work with your insurance company to obtain an approval for this test. You will be notified by a postcard in the mail or a telephone call when the referral has been approved. Non-urgent referrals may take up to 2 weeks for approval. Once the referral has been approved, please call [phone] option 3 to schedule an appointment in [place]. You will receive a letter or telephone call with your results.      A laboratory test has been ordered for you.  No fasting with next A1c.

## 2021-07-16 NOTE — Progress Notes (Signed)
Patient WAS wearing a surgical mask  Contact precautions were followed when caring for the patient.   PPE used by provider during encounter: Surgical mask

## 2021-07-16 NOTE — Progress Notes (Signed)
Paula Daugherty is a 37yrfemale who presents with a chief complaint of "don't know why I am here"--other than for follow-up.    Chief Complaint   Patient presents with    Diabetes       Recent upper respiratory infection: seen by another provider; cough seems almost completely resolved   DM2: recent increase in FSBS; has new insulin pen and now seeming back at goal   3.  Elevated ALk Phos: possible trend downward; amenable to recheck labs     ROS:   .Constitutional: no fever/chills.  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 8.5 g 3    Amitriptyline (ELAVIL) 10 mg Tablet Take 1 tablet by mouth every day at bedtime. 30 tablet 11    Amlodipine (NORVASC) 10 mg Tablet Take 1 tablet by mouth every day. 90 tablet 3    Atorvastatin (LIPITOR) 10 mg Tablet Take 1 tablet by mouth every day at bedtime. 90 tablet 3    Benzonatate (TESSALON PERLES) 100 mg Capsule Take 1-2 capsules by mouth three times daily if needed for cough. 50 capsule 1    Empagliflozin (JARDIANCE) 25 mg Tablet Take 1 tablet by mouth every day. 90 tablet 3    Losartan (COZAAR) 25 mg Tablet Take 1 tablet by mouth every day. 90 tablet 3    Metformin (GLUCOPHAGE) 1,000 mg tablet Take 1 tablet by mouth 2 times daily with meals. (diabetes)  180 tablet 3    Omeprazole (PRILOSEC) 20 mg Delayed Release Capsule TAKE 1 CAPSULE BY MOUTH TWICE DAILY BEFORE MEALS 180 capsule 0    Ondansetron (ZOFRAN-ODT) 8 mg disintegrating tablet Take 1 tablet by mouth every 8 hours if needed. 100 tablet 1    ONETOUCH ULTRA TEST Strips USE TO TEST BLOOD GLUCOSE TWICE DAILY. 200 strip 3    rimegepant (NURTEC ODT) 75 mg Disintegrating Tablet Take 1 tablet by mouth if needed (Take at start of headache. May take up to once per day.). 15 tablet 3     No current facility-administered medications on file prior to visit.     Allergies:   Allergies   Allergen Reactions    Contrast Dye [Radiopaque Agent] Hives    Hydrochlorothiazide Other-Reaction in Comments     Patient reports    Januvia [Sitagliptin] Other-Reaction in Comments     Patient reports    Keflex [Cephalexin] Abdominal Pain    Lyrica [Pregabalin] Other-Reaction in Comments     Patient reports    Omnipaque [Iohexol] Other-Reaction in Comments     Patient reports  Prozac [Fluoxetine Hcl] Other-Reaction in Comments     Patient reports    Sulfa (Sulfonamide Antibiotics) Rash    Topiramate Other-Reaction in Comments     Hairfall       Social History     Socioeconomic History    Marital status: MARRIED    Number of children: 1   Occupational History    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 [employer]    Tobacco Use    Smoking status: Former     Packs/day: 0.10     Years: 20.00     Pack years: 2.00     Types: Cigarettes    Smokeless tobacco: Never    Tobacco comments:     quit 30 years ago   Substance and Sexual Activity    Alcohol use: Yes     Alcohol/week: 0.0 standard drinks     Comment: rare    Drug use: No    Sexual activity: Yes     Partners: Male     Family History   Problem Relation Name Age of Onset    Diabetes Father      Heart Mother          MI at 72s     Non-contributory Brother      Non-contributory Brother         PE:  BP 130/77 (SITE: right arm, Orthostatic Position: sitting, Cuff Size: regular)   Pulse 66    Temp 36.3 C (97.3 F) (Temporal)   Resp 16   Wt 74.7 kg (164 lb 10.9 oz)   LMP 05/06/1983   BMI 30.12 kg/m?   Marland KitchenGeneral Appearance: healthy, alert, no distress, pleasant affect, cooperative.  Heart:  normal rate and regular rhythm, no murmurs, clicks, or gallops.  Lungs: clear to auscultation.  Extremities:  no cyanosis, clubbing, or edema.  Mental Status: appropriate affect, behavior and mentation     Assessment and Plan:  (E11.22,  N18.31,  Z79.4) Type 2 diabetes mellitus with stage 3a chronic kidney disease, with long-term current use of insulin (HCC)  (primary encounter diagnosis)  Comment: GFR just below 60 at 50s; A1c at goal   Plan: patient to continue with current therapy     (I10) Essential hypertension  Comment: at goal   Plan: to add clonidine to see if able to retry and benefit hot flashes     (J06.9) Acute URI  Comment: mild symptoms seem almost resolved   Plan: Call or return to clinic if symptoms worsen, fail to improve  or other symptoms develop.     (R74.8) Alkaline phosphatase elevation  Comment: possible trend downward   Plan: Comprehensive Metabolic Panel, Alkaline         Phosphatase Isoenzyme SendOut          Number and Complexity of Problems Addressed  2 or more stable chronic illnesses    Amount and/or Complexity of Data to be Reviewed and Analyzed  2 unique tests ordered    Risk of Complications and/or Morbidity or Mortality of Patient Management  Moderate          Total encounter time including history, physical examination, and coordination of care was approximately 16 minutes of which more than 50% was spent counseling regarding assessment/diagnosis and treatment plan. No guarantees were made regarding her medical care or treatment outcome. Barriers to Learning: none.  Patient verbalizes understanding of teaching and instructions.    Electronically signed by:    [name], MD  [place], [place] Board  of Family Medicine  Associate physician [facility] [place]    [phone]

## 2021-07-16 NOTE — Nursing Note (Signed)
Patient roomed, chief complaint noted, allergies verified, blood pressure, pulse, respiration, temperature, and weight obtained, screened for pain, and pharmacy verified.    2 Phillis Haggis, M.A.    PPE Used During the Visit:    Eye and Face Protection:          Body Protection:   [] Glasses/Goggles    [] Gown   [] Face Shield   [x] Surgical Mask   Hand Protection:   [] N-95 Mask     [] Gloves   [] Particulate Respirator   [] Half/Full Face Elastomeric Respirator   [] PAPR    Patient:    [x] Surgical Mask      The patient /  surrogate was counseled about the importance of Advance Care Planning. In addition:  There are no ACP documents in / surrogate the EHR. The patient was given information about how to create and file ACP documents  Phillis Haggis, M.A.

## 2021-07-17 ENCOUNTER — Other Ambulatory Visit: Payer: Self-pay | Admitting: Family Medicine

## 2021-07-17 NOTE — Telephone Encounter (Signed)
Recent Visits  Date Type Provider Dept   07/16/21 Office Visit [name], MD [place]   06/27/21 Office Visit [place], Cherie Ouch, MD [facility]   04/17/21 Office Visit [name], MD [place]   01/16/21 Office Visit [name], MD [address]   08/14/20 Office Visit [name], MD [place]   06/19/20 Office Visit [name], MD [place] recent visits within past 540 days with a meds authorizing provider and meeting all other requirements  Future Appointments  Date Type Provider Dept   11/13/21 Appointment [name], MD [place] future appointments within next 150 days with a meds authorizing provider and meeting all other requirements    Last fill 04/09/21

## 2021-07-18 ENCOUNTER — Encounter: Payer: Self-pay | Admitting: Family Medicine

## 2021-07-18 ENCOUNTER — Telehealth: Payer: Self-pay | Admitting: Neurology

## 2021-07-18 NOTE — Telephone Encounter (Signed)
PA submitted to [employer] for Nurtec, via covermymeds Key: [id number].    Charrise Lardner   PSR III - Medications/Infusions Prior [facility]  Ph [phone]

## 2021-07-19 ENCOUNTER — Ambulatory Visit: Payer: Medicare Other | Admitting: Neurology

## 2021-07-26 ENCOUNTER — Encounter: Payer: Self-pay | Admitting: Family Medicine

## 2021-07-26 ENCOUNTER — Ambulatory Visit: Payer: Medicare Other | Admitting: Emergency Medicine

## 2021-07-26 DIAGNOSIS — U071 COVID-19: Secondary | ICD-10-CM

## 2021-07-26 LAB — COVID-19 OUTSIDE LABS (ENTER/EDIT): COVID-19 Outside Labs (Enter/Edit): POSITIVE — AB

## 2021-07-26 MED ORDER — PAXLOVID 300 MG (150 MG X 2)-100 MG TABLETS IN A DOSE PACK
ORAL_TABLET | ORAL | 0 refills | Status: AC
Start: 2021-07-26 — End: 2021-07-31

## 2021-07-26 NOTE — Patient Instructions (Addendum)
HOLD STATIN FOR 8 DAYS.     FACT SHEET FOR PATIENTS, PARENTS, AND CAREGIVERS   EMERGENCY USE AUTHORIZATION (EUA) OF PAXLOVID   FOR CORONAVIRUS DISEASE 2019 (COVID-19)     You are being given this Fact Sheet because your healthcare provider believes it is necessary to provide you with PAXLOVID for the treatment of mild-to-moderate coronavirus disease (COVID-19) caused by the SARS-CoV-2 virus. This Fact Sheet contains information to help you understand the risks and benefits of taking the PAXLOVID you have received or may receive. This Fact Sheet also contains information about how to take PAXLOVID and how to report side effects or problems with the appearance or packaging of PAXLOVID.     The U.S. Food and Drug Administration (FDA) has issued an Emergency Use Authorization (EUA) to make PAXLOVID available during the COVID-19 pandemic (for more details about an EUA please see What is an Emergency Use Authorization? at the end of this document). PAXLOVID is not an FDA approved medicine in the [place]. Read this Fact Sheet for information about PAXLOVID. Talk to your healthcare provider about your options or if you have any questions. It is your choice to take PAXLOVID.     What is COVID-19?     COVID-19 is caused by a virus called a coronavirus. You can get COVID-19 through close contact with another person who has the virus. COVID-19 illnesses have ranged from very mild-to-severe, including illness resulting in death. While information so far suggests that most COVID-19 illness is mild, serious illness can happen and may cause some of your other medical conditions to become worse. Older people and people of all ages with severe, long lasting (chronic) medical conditions like heart disease, lung disease, and diabetes, for example seem to be at higher risk of being hospitalized for COVID-19.     If you are experiencing COVID-19 symptoms:    Fever 100 degrees or greater  New loss of taste or smell  Shortness of  breath  Muscle pain  Cough  Chills    Please first contact your pharmacy for pickup instructions to avoid spreading COVID-19.    What is PAXLOVID?     PAXLOVID is an investigational medicine used to treat adults and children [9 years of age and older weighing at least 58 pounds (64 kg)] with a current diagnosis of mild-to-moderate COVID-19 and who are at high risk for progression to severe COVID-19, including hospitalization or death. PAXLOVID is investigational because it is still being studied. There is limited information about the safety and effectiveness of using PAXLOVID to treat people with mild-to-moderate COVID-19.     The FDA has authorized the emergency use of PAXLOVID for the treatment of adults and children [7 years of age and older weighing at least 34 pounds (46 kg)] with a current diagnosis of mild-to-moderate COVID-19 and who are at high risk for progression to severe COVID-19, including hospitalization or death, under an EUA.       What should I tell my healthcare provider before I take PAXLOVID?   Tell your healthcare provider if you:    Have any allergies    Have liver or kidney disease    Are pregnant or plan to become pregnant    Are breastfeeding a child    Have any serious illnesses     Some medicines may interact with PAXLOVID and may cause serious side effects.    Tell your healthcare provider about all the medicines you take, including prescription and over-the-counter medicines,  vitamins, and herbal supplements.    Your healthcare provider can tell you if it is safe to take PAXLOVID with other medicines.    You can ask your healthcare provider or pharmacist for a list of medicines that interact with PAXLOVID.    Do not start taking a new medicine without telling your healthcare provider.     Tell your healthcare provider if you are taking combined hormonal contraceptive. PAXLOVID may affect how your birth control pills work. Females who are able to become pregnant should use another  effective alternative form of contraception or an additional barrier method of contraception. Talk to your healthcare provider if you have any questions about contraceptive methods that might be right for you.     How do I take PAXLOVID?      PAXLOVID consists of 2 medicines: nirmatrelvir tablets and ritonavir tablets. The 2 medicines are taken together 2 times each day for 5 days.   Nirmatrelvir is an oval, pink tablet.   Ritonavir is a white or off-white tablet.      PAXLOVID is available in 2 Dose Packs (see Figures A and B below). Your healthcare provider will prescribe the PAXLOVID Dose Pack that is right for you.    If you have kidney disease, your healthcare provider may prescribe a lower dose (see Figure B). Talk to your healthcare provider to make sure you receive the correct Dose Pack.       Do not remove your PAXLOVID tablets from the blister card before you are ready to take your dose.    Take your first dose of PAXLOVID in the Morning or Evening, depending on when you pick up your prescription, or as recommended by your healthcare provider.    Swallow the tablets whole. Do not chew, break, or crush the tablets.    Take PAXLOVID with or without food.    Do not stop taking PAXLOVID without talking to your healthcare provider, even if you feel better.    If you miss a dose of PAXLOVID within 8 hours of the time it is usually taken, take it as soon as you remember. If you miss a dose by more than 8 hours, skip the missed dose and take the next dose at your regular time. Do not take 2 doses of PAXLOVID at the same time.    If you take too much PAXLOVID, call your healthcare provider or go to the nearest hospital emergency room right away.    If you are taking a ritonavir- or cobicistat-containing medicine to treat hepatitis C or Human Immunodeficiency Virus (HIV), you should continue to take your medicine as prescribed by your healthcare provider.     Talk to your healthcare provider if you do not feel  better or if you feel worse after 5 days.     Who should generally not take PAXLOVID?     Do not take PAXLOVID if:      You are allergic to nirmatrelvir, ritonavir, or any of the ingredients in PAXLOVID.    You are taking any of the following medicines:   fuzosin   amiodarone   apalutamide   carbamazepine   colchicine   dihydroergotamine   dronedarone   eletriptan   eplerenone   ergotamine   finerenone   flecainide   flibanserin   ivabradine   lomitapide   lovastatin   lumacaftor/ivacaftor   lurasidone   methylergonovine   midazolam (oral)   naloxegol   phenobarbital   phenytoin  pimozide   primidone   propafenone   quinidine   ranolazine   rifampin   St. John’s Wort (hypericum perforatum)   sildenafil (Revatio®) for pulmonary arterial hypertension   silodosin   Simvastatin  tolvaptan   triazolam   ubrogepant  voclosporin     Taking PAXLOVID with these medicines may cause serious or  life-threatening side effects or affect how PAXLOVID works.     These are not the only medicines that may cause serious side effects if taken with PAXLOVID. PAXLOVID may increase or decrease the levels of multiple other medicines. It is very important to tell your healthcare provider about all of the medicines you are taking because additional laboratory tests or changes in the dose of your other medicines may be necessary while you are taking PAXLOVID. Your healthcare provider may also tell you about specific symptoms to watch out for that may indicate that you need to stop or decrease the dose of some of your other medicines.     What are the important possible side effects of PAXLOVID?     Possible side effects of PAXLOVID are:     Allergic Reactions. Allergic reactions, including severe allergic reactions (known as ‘anaphylaxis’), can happen in people taking PAXLOVID, even after only 1 dose. Stop taking PAXLOVID and call your healthcare provider right away if you get any of the following symptoms of an allergic reaction:    hives  trouble swallowing or breathing  swelling of the mouth, lips, or face  throat tightness  hoarseness   skin rash      Liver Problems. Tell your healthcare provider right away if you have any of these signs and symptoms of liver problems: loss of appetite, yellowing of your skin and the whites of eyes (jaundice), dark-colored urine, pale colored stools and itchy skin, stomach area (abdominal) pain.      Resistance to HIV Medicines. If you have untreated HIV infection, PAXLOVID may lead to some HIV medicines not working as well in the future.       Other possible side effects include:    altered sense of taste   diarrhea    high blood pressure    muscle aches    abdominal pain    nausea    feeling generally unwell     These are not all the possible side effects of PAXLOVID. Not many people have taken PAXLOVID. Serious and unexpected side effects may happen. PAXLOVID is still being studied, so it is possible that all of the risks are not known at this time.     What other treatment choices are there?     Veklury (remdesivir) is FDA-approved for the treatment of mild-to-moderate COVID-19 in certain adults and children. Talk with your healthcare provider to see if Veklury is appropriate for you.   Like PAXLOVID, FDA may also allow for the emergency use of other medicines to treat people with COVID-19. Go to https://www.fda.gov/emergency-preparedness-and-response/mcm-legal-regulatory-and-policy-framework/emergency-use-authorization for information on the emergency use of other medicines that are authorized by FDA to treat people with COVID-19. Your healthcare provider may talk with you about clinical trials for which you may be eligible.   It is your choice to be treated or not to be treated with PAXLOVID. Should you decide not to receive it or for your child not to receive it, it will not change your standard medical care.     What if I am pregnant or breastfeeding?     There is no experience treating pregnant    women or breastfeeding mothers with PAXLOVID. For a mother and unborn baby, the benefit of taking PAXLOVID may be greater than the risk from the treatment. If you are pregnant, discuss your options and specific situation with your healthcare provider.   It is recommended that you use effective barrier contraception or do not have sexual activity while taking PAXLOVID.   If you are breastfeeding, discuss your options and specific situation with your healthcare provider.     How do I report side effects or problems with the appearance or packaging of PAXLOVID?     Contact your healthcare provider if you have any side effects that bother you or do not go away.     Report side effects or problems with the appearance or packaging of PAXLOVID (see Figures A and B above for examples of PAXLOVID Dose Packs) to FDA MedWatch at www.fda.gov/medwatch or call 1-800-FDA-1088 or you can report side effects to [employer] at the contact information provided below.     Website  Fax number Telephone number   www.pfizersafetyreporting.com 1-866-635-8337 1-800-438-1985       How should I store PAXLOVID?  Store PAXLOVID tablets at room temperature, between 68?F to 77?F (20?C to 25?C).    How can I learn more about COVID-19?  Ask your healthcare provider.  Visit https://www.cdc.gov/COVID19  Contact your local or state public health department.    What is an Emergency Use Authorization (EUA)?  The [place] FDA has made PAXLOVID available under an emergency access  mechanism called an Emergency Use Authorization (EUA). The EUA is supported by a  Secretary of Health and Human Service (HHS) declaration that circumstances exist to  justify the emergency use of drugs and biological products during the COVID-19  Pandemic.    PAXLOVID for the treatment of mild-to-moderate COVID-19 in adults and children  [12 years of age and older weighing at least 88 pounds (40 kg)] with positive results of  direct SARS-CoV-2 viral testing, and who are at  high risk for progression to severe  COVID-19, including hospitalization or death, has not undergone the same type of  review as an FDA-approved product. In issuing an EUA under the COVID-19 public  health emergency, the FDA has determined, among other things, that based on the  total amount of scientific evidence available including data from adequate and  well-controlled clinical trials, if available, it is reasonable to believe that the product may  be effective for diagnosing, treating, or preventing COVID-19, or a serious or  life-threatening disease or condition caused by COVID-19; that the known and potential  benefits of the product, when used to diagnose, treat, or prevent such disease or  condition, outweigh the known and potential risks of such product; and that there are no  adequate, approved, and available alternatives.    All of these criteria must be met to allow for the product to be used in the treatment of  patients during the COVID-19 pandemic. The EUA for PAXLOVID is in effect for the  duration of the COVID-19 declaration justifying emergency use of this product, unless  terminated or revoked (after which the products may no longer be used under the  EUA).                              Additional Information  For general questions, visit the website or call the telephone number provided below.    Website Telephone number   www.COVID19oralRx.com       1-877-219-7225  (1-877-C19-PACK)        www.pfizermedinfo.com or call 1-800-438-1985 for more  Information.        [id number]   Revised: 06/2021

## 2021-07-26 NOTE — Telephone Encounter (Signed)
From: [name]  To: [name], MD  Sent: 07/25/2021 8:05 PM PDT  Subject: COVID    Hi Dr. I started feeling sick yesterday and got worse today. DJ had me test for Covid. I got a very faint line on the test under the positive spot. I have a productive cough, sore throat, headache, body aches over entire body, I feel warm but no temp when I take it, no appetite but lots of fluids. BP today was 128/73. My BS number are up a little but still less than 150. I just plain feel yucky. I am just staying in and laying low. I will test again in a couple of days. My daughter said I needed to tell you.   DJ says he feels fine so he is taking care of me.   Please let me know if there is anything you want me to do.

## 2021-07-26 NOTE — Telephone Encounter (Signed)
Sent home care for Covid. Advised [facility] or [facility] to discuss antiviral medication.    Updated pt's chart to reflect positive Covid test.

## 2021-07-26 NOTE — Progress Notes (Signed)
Video Visit Note     I performed this clinical encounter by utilizing a real time telehealth video connection between my location and the patient's location. The patient's location was confirmed during this visit. I obtained verbal consent from the patient to perform this clinical encounter utilizing video and prepared the patient by answering any questions they had about the telehealth interaction.    I spent a total of 8 minutes today on this patient?s visit which includes time spent on chart review, connection, video visit, medication review, and documentation.     36[place].  Otherwise feeling fine, denies any significant shortness of breath.     Exam:   NAD, no shortness of breath, speaking full sentences    Plan:   Reassuring clinical presentation.  We discussed the risks and benefits with antiviral treatment, also reviewed medication list. Hold statin x 8 days.  Sent to preferred to pharmacy. Return precautions provided to the patient.         Electronically signed by: DGaren Lah NDora Sims MD   Attending Physician  Professor  Department of Emergency Medicine

## 2021-08-06 ENCOUNTER — Ambulatory Visit
Admission: RE | Admit: 2021-08-06 | Discharge: 2021-08-06 | Disposition: A | Discharge status: Home / Self Care | Payer: Medicare Other | Source: Ambulatory Visit | Attending: Family Medicine | Admitting: Family Medicine

## 2021-08-06 ENCOUNTER — Ambulatory Visit (INDEPENDENT_AMBULATORY_CARE_PROVIDER_SITE_OTHER): Payer: Medicare Other

## 2021-08-06 DIAGNOSIS — N83202 Unspecified ovarian cyst, left side: Secondary | ICD-10-CM

## 2021-08-06 DIAGNOSIS — E1129 Type 2 diabetes mellitus with other diabetic kidney complication: Secondary | ICD-10-CM | POA: Insufficient documentation

## 2021-08-06 DIAGNOSIS — Z794 Long term (current) use of insulin: Secondary | ICD-10-CM

## 2021-08-06 DIAGNOSIS — R748 Abnormal levels of other serum enzymes: Secondary | ICD-10-CM

## 2021-08-06 DIAGNOSIS — N949 Unspecified condition associated with female genital organs and menstrual cycle: Secondary | ICD-10-CM | POA: Insufficient documentation

## 2021-08-06 LAB — COMPREHENSIVE METABOLIC PANEL
Alanine Transferase (ALT): 42 U/L — ABNORMAL HIGH (ref <=33)
Albumin: 4.3 g/dL (ref 4.0–4.9)
Alkaline Phosphatase (ALP): 158 U/L — ABNORMAL HIGH (ref 35–129)
Anion Gap: 10 mmol/L (ref 7–15)
Aspartate Transaminase (AST): 35 U/L (ref <=41)
Bilirubin Total: 0.6 mg/dL (ref <=1.2)
Calcium: 9.3 mg/dL (ref 8.6–10.0)
Carbon Dioxide Total: 25 mmol/L (ref 22–29)
Chloride: 106 mmol/L (ref 98–107)
Creatinine Serum: 1.33 mg/dL — ABNORMAL HIGH (ref 0.51–1.17)
E-GFR Creatinine (Female): 42 mL/min/{1.73_m2}
Glucose: 175 mg/dL — ABNORMAL HIGH (ref 74–109)
Potassium: 4.6 mmol/L (ref 3.4–5.1)
Protein: 7.8 g/dL (ref 6.6–8.7)
Sodium: 141 mmol/L (ref 136–145)
Urea Nitrogen, Blood (BUN): 28 mg/dL — ABNORMAL HIGH (ref 6–20)

## 2021-08-06 LAB — HEMOGLOBIN A1C
Hgb A1C,Glucose Est Avg: 157 mg/dL
Hgb A1C: 7.1 % — ABNORMAL HIGH (ref 3.9–5.6)

## 2021-08-09 LAB — ALKALINE PHOSPHATASE ISOENZYME SENDOUT
ALK-PHOSPHATASE BONE CALC: 58 U/L — ABNORMAL HIGH (ref 0–55)
ALK-PHOSPHATASE LIVER CALC: 108 U/L — ABNORMAL HIGH (ref 0–94)
ALK-PHOSPHATASE OTHER CALC: 0 U/L
ALK-PHOSPHATASE: 166 U/L — ABNORMAL HIGH (ref 40–120)

## 2021-08-14 ENCOUNTER — Ambulatory Visit: Payer: Medicare Other | Attending: "Endocrinology | Admitting: "Endocrinology

## 2021-08-14 DIAGNOSIS — N183 Chronic kidney disease, stage 3 unspecified: Secondary | ICD-10-CM

## 2021-08-14 DIAGNOSIS — E1129 Type 2 diabetes mellitus with other diabetic kidney complication: Secondary | ICD-10-CM

## 2021-08-14 DIAGNOSIS — E279 Disorder of adrenal gland, unspecified: Secondary | ICD-10-CM | POA: Insufficient documentation

## 2021-08-14 DIAGNOSIS — E1122 Type 2 diabetes mellitus with diabetic chronic kidney disease: Secondary | ICD-10-CM

## 2021-08-14 DIAGNOSIS — I10 Essential (primary) hypertension: Secondary | ICD-10-CM

## 2021-08-14 DIAGNOSIS — Z7984 Long term (current) use of oral hypoglycemic drugs: Secondary | ICD-10-CM | POA: Insufficient documentation

## 2021-08-14 DIAGNOSIS — E119 Type 2 diabetes mellitus without complications: Secondary | ICD-10-CM | POA: Insufficient documentation

## 2021-08-14 MED ORDER — EMPAGLIFLOZIN 25 MG TABLET
25.0000 mg | ORAL_TABLET | Freq: Every day | ORAL | 3 refills | Status: DC
Start: 2021-08-14 — End: 2022-06-11

## 2021-08-14 MED ORDER — LOSARTAN 25 MG TABLET
25.0000 mg | ORAL_TABLET | Freq: Every day | ORAL | 3 refills | Status: DC
Start: 2021-08-14 — End: 2022-06-11

## 2021-08-14 MED ORDER — METFORMIN 1,000 MG TABLET: 1000.0000 mg | ORAL_TABLET | Freq: Two times a day (BID) | ORAL | 3 refills | Status: DC

## 2021-08-14 MED ORDER — ATORVASTATIN 10 MG TABLET: 10.0000 mg | ORAL_TABLET | Freq: Every day | ORAL | 3 refills | Status: DC

## 2021-08-14 NOTE — Nursing Note (Signed)
Vital signs taken, allergies verified, screened for pain.   Patient WAS wearing a surgical mask  Droplet precautions were followed when caring for the patient.   PPE used by provider and I was wearing surgical mask a during the rooming process.   Downloaded Meter today    Verner Chol, [place]

## 2021-08-20 ENCOUNTER — Encounter: Payer: Self-pay | Admitting: Obstetrics & Gynecology

## 2021-08-20 NOTE — Telephone Encounter (Signed)
From: Paula Daugherty  To: [name], MD  Sent: 08/20/2021 9:17 AM PDT  Subject: Clonidine and Ultrasound     Good morning Dr., you asked me to touch base after taking the Clonidine for a month. I am still having hot flashes but I don't think as often. You said you could change meds but I was wondering if I can stay on this one for the full 90 day Rx giving it a longer try? Also my ultrasound showed that the cyst on my ovary has gotten smaller again. Good news. I have an appointment to see you on June 28th for a reassessment of health concerns. Do you feel that things will be fine until then? If so should I stay on the Clonidine until then or just stop once the 90 day Rx is gone? Thank you for all your help and guidance.

## 2021-08-21 ENCOUNTER — Telehealth: Payer: Self-pay | Admitting: Family Medicine

## 2021-08-21 ENCOUNTER — Ambulatory Visit: Payer: Medicare Other

## 2021-08-21 NOTE — Telephone Encounter (Signed)
Pt dropped off [place] for Doctor to review.    Forms placed in Doctor's mail slot.

## 2021-08-22 NOTE — Telephone Encounter (Signed)
Received [employer] Directive from Dr. [name] mail box.  Entered info in EMR. Placed in Dr. [name] in box on desk for review.

## 2021-08-25 ENCOUNTER — Ambulatory Visit: Payer: Medicare Other

## 2021-08-26 NOTE — Telephone Encounter (Signed)
Reviewed and will ask that these get scanned.

## 2021-08-28 NOTE — Progress Notes (Signed)
 Endocrinology Follow up clinic note:    Chief Complaint:  I'm here to follow up on my diabetes.    HPI: Paula Daugherty is a 76yr old female who has a diagnosis of type 2 diabetes mellitus who presents to Endocrinology for follow up. Her history is as follows:  She was initially diagnosed with diabetes around 2000 on routine labs. She has a strong family history of diabetes so she wasn't surprised at this. She was started on Metformin  around 2005 and then she was started on insulin  in 2014. She was up to 64 units of Lantus  at night. However, after making changes to her diet and lifestyle, she was taken off insulin  in 2015. Basal insulin  was added back to her regimen in 2018.      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     Interim History: She returns today for follow up. Her last visit with Endocrinology was on 05/14/2021. She states that since this visit, she has overall been doing ok. She has been able to successfully stop insulin  and has been working to titrate down on her metformin  dose, however, due to recent illnesses, her blood glucose has been trending up recently. She denies any significant recent hypoglycemia or hypoglycemia symptoms.     Current Regimen:              Jardiance  25 mg daily  Hypoglycemia: None recently  Hyperglycemia: blood glucose is rarely >150  Blood glucose:   AM: 130 to 178   Afternoon: 116 to 265  Checks blood glucose: 2x/day  Last eye exam: 12/2020    ROS:  All other systems were reviewed and are negatative except for pertinent positive and negative responses as documented in HPI.     Medications:  Medication reconciliation was not performed today.   Outpatient Medications Marked as Taking for the 08/14/21 encounter (Office Visit) with Arman Lonell Rosaline Brown, MD   Medication Sig Dispense Refill    Albuterol  (PROAIR  HFA, PROVENTIL  HFA, VENTOLIN  HFA) 90 mcg/actuation inhaler Take 1-2 puffs by inhalation every 6 hours if needed for wheezing. 8.5 g 3     Amlodipine  (NORVASC) 10 mg Tablet Take 1 tablet by mouth every day. 90 tablet 3    Atorvastatin  (LIPITOR) 10 mg Tablet Take 1 tablet by mouth every day at bedtime. 90 tablet 3    CloNIDine  (CATAPRES ) 0.1 mg Tablet Take 1 tablet by mouth every day at bedtime. Indications: change of life signs 90 tablet 1    Empagliflozin  (JARDIANCE ) 25 mg Tablet Take 1 tablet by mouth every day. 90 tablet 3    Losartan  (COZAAR ) 25 mg Tablet Take 1 tablet by mouth every day. 90 tablet 3    Metformin  (GLUCOPHAGE ) 1,000 mg tablet Take 1 tablet by mouth 2 times daily with meals. (diabetes) 180 tablet 3    Omeprazole  (PRILOSEC) 20 mg Delayed Release Capsule GENERIC FOR PRILOSEC-- TAKE 1 CAPSULE BY MOUTH TWICE DAILY BEFORE MEALS 180 capsule 1    Ondansetron  (ZOFRAN -ODT) 8 mg disintegrating tablet Take 1 tablet by mouth every 8 hours if needed. 100 tablet 1    ONETOUCH ULTRA TEST Strips USE TO TEST BLOOD GLUCOSE TWICE DAILY. 200 strip 3     I did review patient's past medical and family/social history, no changes noted.   PMH:  Past medical history was reviewed from problem list.   Patient Active Problem List   Diagnosis    DM2 (diabetes mellitus, type 2) (  HCC)    Depression with anxiety    Stress at home    Leg cramps-right calf    Right ear pain    Fibromyalgia    HTN (hypertension)    GERD (gastroesophageal reflux disease)    Hyperlipidemia with target LDL less than 70    Vitamin D  insufficiency    Abnormal LFTs    Renal cyst ( 6.4 cm cyst left kidney)    Cough    Fatty liver    Macroalbuminuric diabetic nephropathy (HCC)    History of actinic keratoses    Cataract    Ptosis of eyelid    Liver fibrosis    Adrenal nodule (HCC)    Medication Therapy Auth    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HCC)    Mixed conductive and sensorineural hearing loss of both ears    Neoplasm of uncertain behavior of skin    Abdominal pain, generalized    Nausea without vomiting    Asymptomatic microscopic hematuria    Subcutaneous nodule     Angina pectoris (HCC)    Mild episode of recurrent major depressive disorder (HCC)    NASH (nonalcoholic steatohepatitis)       VITAL SIGNS:  BP 131/78 (SITE: left arm, Orthostatic Position: sitting, Cuff Size: large)   Pulse 61   Temp 36.5 C (97.7 F) (Temporal)   Ht 1.575 m (5' 2)   Wt 73 kg (160 lb 15 oz)   LMP 05/06/1983   SpO2 98%   BMI 29.44 kg/m   Body mass index is 29.44 kg/m.    PHYSICAL EXAM:  General Appearance: healthy, alert, no distress, pleasant affect, cooperative.  Eyes:  conjunctivae and corneas clear. PERRL, EOM's intact. sclerae normal.  Neck:  Neck supple. No adenopathy, thyroid  symmetric, normal size.  Heart:  normal rate and regular rhythm, no murmurs, clicks, or gallops.  Lungs: clear to auscultation.  Extremities:  no cyanosis, clubbing, or edema.  Skin:  Skin color, texture, turgor normal. No rashes or lesions.  Neuro: Gait normal. Reflexes normal and symmetric. Sensation and strength grossly normal.  Mental Status: Appearance/Cooperation: in no apparent distress and well developed and well nourished  Eye Contact: normal  Speech: normal volume, rate, and pitch    LAB TESTS/STUDIES:   I personally reviewed the following  laboratory and/or imaging studies .   05/07/21 08:52 08/06/21 08:59   Sodium 142 141   Potassium 4.4 4.6   Chloride 103 106   Carbon Dioxide Total 29 25   Anion Gap  10   Urea Nitrogen, Blood (BUN) 20 28 (H)   Creatinine Blood 0.96 1.33 (H)   Glucose 148 (H) 175 (H)   Calcium  9.5 9.3   Protein 8.1 7.8   Albumin 4.5 4.3   Alkaline Phosphatase (ALP) 144 (H) 158 (H)   Aspartate Transaminase (AST) 33 35   Bilirubin Total 1.0 0.6   Alanine Transferase (ALT) 42 (H) 42 (H)   E-GFR Creatinine (Female) 59 42   Iron  Total 58  58    Transferrin 287    Total Iron  Binding Capacity 399    Iron  Percent Saturation 14.5 (L)    Ferritin 15    Folate 18.6    Thyroid  Stimulating Hormone 1.02    Hgb A1C 6.7 (H) 7.1 (H)   Hgb A1C,Glucose Est Avg 146 157   Vitamin B12 691    White Blood  Cell Count 5.0    Red Blood Cell Count 4.94  4.94  Hemoglobin 12.1    Hematocrit 39.0  39.0    MCV 78.8 (L)    MCH 24.5 (L)    MCHC 31.1 (L)    RDW 16.2 (H)    Platelet Count 172        04/12/2021:  Component Ref Range & Units     TOTAL VOLUME mL 1620    TIME OF COLLECTION hr 24    DOPAMINE,UR PER VOLUME ug/L 76    DOPAMINE,UR PER 24HR 71 - 485 ug/d 123    DOPAMINE,UR RATIO TO CRT 0 - 250 ug/g CRT 131    NOREPINEPHRINE,UR PER VOLUME ug/L 24    NOREPINEPHRINE,UR PER 24HR 14 - 120 ug/d 39    NOREPINEPHRINE,UR RATIO TO CRT 0 - 45 ug/g CRT 41    EPINEPHRINE ,UR PER VOLUME ug/L 1    EPINEPHRINE ,UR PER 24 HR 1 - 14 ug/d 2    EPINEPHRINE ,UR RATIO TO CRT 0 - 20 ug/g CRT 2    CATECHOLAMINES INTERPRETATION   See Note    CREATININE,UR PER VOLUME mg/dL 58    CREATININE,UR PER 24HR 500 - 1400 mg/d 940          Component Ref Range & Units     TOTAL VOLUME mL 1620    TIME OF COLLECTION hr 24    CORTISOL,UR FREE PER VOLUME ug/L 18.00    CORTISOL,UR FREE PER 24HR <=45.0 ug/d 29.2    CORTISOL,UR FREE RATIO TO CRT ug/g CRT 31.58    Comment: Reference Interval: Cortisol ug/g crt       Component Ref Range & Units     Urine Volume mL 1,620    START DATE   04/11/2021    START TIME    7:30 AM    STOP DATE   04/12/2021    STOP TIME    7:30 AM    COLLECTION INTERVAL, URINE H 24.00    Creatinine Conc mg/dL 46.3    Creatinine 24 Hr 720 - 1,510 mg/24Hr 868       Component Ref Range & Units     TOTAL VOLUME mL 1620    TIME OF COLLECTION hr 24    METANEPHRINE,UR-PER VOLUME ug/L 65    METANEPHRINE,UR-PER 24HR 36 - 229 ug/d 105    METANEPHRINE,UR-RATIO TO CRT 0 - 300 ug/g CRT 112    NORMETANEPHRINE,UR-PER VOLUME ug/L 224    NORMETANEPHRINE,UR-PER 24HR 95 - 650 ug/d 363    NORMETANEPHRIN,UR-RATIO TO CRT 0 - 400 ug/g CRT 386    METANEPHRINES INTERPRETATION   See Note            02/05/21 10:17   Creatinine Spot Urine 90.5  90.5   Microalbumin Urine 6.8   Microalbumin/Creatinine Ratio 75      CT Abdomen and Pelvis (08/22/2020):  FINDINGS:  Lower Chest:  Unremarkable.  Liver: A few scattered small hypodensities too small to characterize. Mild  liver surface nodularity.  Bile Ducts: Unremarkable.  Gallbladder: Unremarkable.  Pancreas: Unremarkable.  Spleen: Unremarkable.  Adrenal Glands: Unremarkable.  Kidneys: Decreased size of left posterior renal cyst with some peripheral  calcifications.  GI Tract: Colonic diverticulosis without diverticulitis. Small soft tissue  nodule anterior to the descending colon previously had fat density and  therefore likely represents an small area of fat process or old torsed  epiploic appendage. Normal appendix.  Peritoneal Cavity: No free fluid or free air.  Bladder: Unremarkable.  Uterus and Ovaries: Status post hysterectomy. This 3.0 cm cystic lesion of  the  left adnexa that is either new or increased from the prior. It does not  have simple fluid attenuation.  Lymph Nodes: Prominent upper abdominal lymph nodes are slightly increased  including a 1.7 lymph node anterior to the hepatic artery, previously 1.2  cm  Major Vascular Structures: There is atherosclerosis of the aorta and branch  vessels.  Soft Tissues: Unremarkable.  Musculoskeletal: Unremarkable.     IMPRESSION:  1. Diverticulosis without diverticulitis.  2. Increasing/new 3.0 cm nonsimple fluid cystic lesion of the left  adnexa. Recommend further evaluation with pelvic ultrasound.  3. Liver surface nodularity suggesting fibrosis.  4. Slight increased upper abdominal mild lymphadenopathy measuring up  to 1.7 cm is nonspecific. The slow growth argues against an aggressive  process. If patient proves to have chronic liver disease, mild upper  abdominal lymphadenopathy is often associated with this.    Impression: This is a 76yr old female with Diabetes Mellitus Type II who presents to Endocrinology for follow up. Overall, her glycemic control is slightly above goal as evidenced by her most recent A1c  of 7.1%. Since discontinuing metformin , she has noted an overall rise in  the glucose so I have recommended restarting  metformin  at this time and continuing her current dose of Jardiance .     She also has a history of an adrenal nodule. Hormonal evaluation was last done and was normal in 04/2021.      DIABETES HISTORY  (-) h/o retinopathy.      (-) h/o microalbuminuria/nephropathy.  (-) h/o neuropathy.  (-) h/o Autonomic dysfunction  (-) h/o Gastroparesis  (-) h/o CAD  (-) h/o PVD     Last eye exam: 12/2020  Last urine microalbumin: 02/2021, 75  Pneumovax: 12/2014, Prevnar 2018  Influenza vaccine: fall 2022  (-) ASA  (+) Statin  (+) ACE/ARB          Recommendations:  (E11.29) Type 2 diabetes mellitus with other diabetic kidney complication  (primary encounter diagnosis)  - Continue current dose of Jardiance   - Restart metformin  now  - Encouraged dietary changes as the patient has been working on  - Encouraged patient to continue close blood glucose monitoring   - Last LDL at goal, continue statin, repeat due 02/2022  - Last microalbumin:creatinine ratio elevated, continue cozaar , repeat due 02/2022  - Up to date on screening eye exam  - Repeat A1c prior to follow up appointment  - Influenza vaccine and COVID-19 vaccinations up to date  - Discussed daily foot care     2. Adrenal nodule  - Biochemical evaluation normal in 04/2021, stability by imaging in 04/2017  - Hormonal evaluation has been normal x 5 years and may be discontinued at this time     Approximately 20 minutes were spent with patient, greater than 50% of which was spent counseling the patient on diabetes management and on coordination of care.    Follow up in 3 months    If you have any questions, please do not hesitate to contact me at [phone].  Thank you for allowing me to participate in the care of this patient.    EDUCATION:  I educated/instructed the patient or caregiver regarding all aspects of the above stated plan of care.  The patient or caregiver indicated understanding.      [place] interpreter was not  used.    Report electronically signed by:  Lonell Lamer, M.D.  Clinical Assistant Professor  Department of Endocrinology

## 2021-08-30 ENCOUNTER — Ambulatory Visit
Admission: RE | Admit: 2021-08-30 | Discharge: 2021-08-30 | Disposition: A | Discharge status: Home / Self Care | Payer: Medicare Other | Source: Ambulatory Visit | Attending: DIAGNOSTIC RADIOLOGY | Admitting: DIAGNOSTIC RADIOLOGY

## 2021-08-30 DIAGNOSIS — M47812 Spondylosis without myelopathy or radiculopathy, cervical region: Secondary | ICD-10-CM

## 2021-08-30 DIAGNOSIS — M5412 Radiculopathy, cervical region: Secondary | ICD-10-CM | POA: Insufficient documentation

## 2021-08-30 DIAGNOSIS — M4802 Spinal stenosis, cervical region: Secondary | ICD-10-CM

## 2021-09-05 ENCOUNTER — Ambulatory Visit: Payer: Medicare Other | Admitting: Physician Assistant

## 2021-09-05 ENCOUNTER — Encounter: Payer: Self-pay | Admitting: Physician Assistant

## 2021-09-05 ENCOUNTER — Ambulatory Visit: Payer: Medicare Other | Admitting: Family Medicine

## 2021-09-05 VITALS — BP 130/81 | HR 63 | Temp 97.2°F | Wt 164.7 lb

## 2021-09-05 DIAGNOSIS — R21 Rash and other nonspecific skin eruption: Secondary | ICD-10-CM

## 2021-09-05 MED ORDER — MUPIROCIN 2 % TOPICAL OINTMENT
TOPICAL_OINTMENT | TOPICAL | 0 refills | Status: AC
Start: 2021-09-05 — End: 2021-09-15

## 2021-09-05 NOTE — Progress Notes (Signed)
[place]     Chief Complaint   Patient presents with    Sores         SUBJECTIVE:  HISTORY     Paula Daugherty is a 76yrold female who presents with the following chief complaint:    Rash: reports onset was 7-10 days ago. It started as bumps on her chin, spread to her nose. They were not blister or pimples. Describes them as red spots that sometimes bleed when touched or when she washes her face. Has some itching along crease of nose and entry of nostrils, some pain. Denies fevers.  No new face wash, creams or medications. Did use neosporin which made more moist. Crusty on and off with some yellowish tint a few days ago. Seem to be stable now.    Has not had any similar issue in the past, expect ingrown hair on the chin, but this is different.    OBJECTIVE:  VITAL SIGNS    weight is 74.7 kg (164 lb 10.9 oz). Her temperature is 36.2 C (97.2 F). Her blood pressure is 130/81 and her pulse is 63.   PHYSICAL     GENERAL APPEARANCE: healthy, alert, no distress, pleasant affect, cooperative.  NOSE:  Nares normal. Septum midline. Mucosa normal. No drainage or sinus tenderness. No obvious sores in the nose.  HEART:  normal rate and regular rhythm, no murmurs, clicks, or gallops.  LUNGS: clear to auscultation.  SKIN:   No other rashes  MENTAL STATUS: Logical and goal directed. Euthymic. Cooperative. Normal thought process and concentration. Attentive.      DATA (Reviewed with patient)     Ordering each unique test: see encounter orders    No orders of the defined types were placed in this encounter.        ASSESSMENT AND PLAN     1. Rash/skin eruption  Acute, stable but needs treatment. Suspect staph infection. She denies any blisters (do not suspect due to herpes infection). Recommended treatment with mupirocin ointment twice a day for 7-10 days. Return to clinic if no improvement or worsening symptoms.   - Mupirocin (BACTROBAN) 2 % Ointment; Apply to affected areas twice a day for 10  days  Dispense: 22 g; Refill: 0      Total time I spent in care of this patient today (excluding time spent on other billable services) was 20 minutes.      PPE Used During the Visit:    Eye and Face Protection:          Body Protection:   [x] Glasses/Goggles    [] Gown   [] Face Shield   [] Surgical Mask   Hand Protection:   [] N-95 Mask     [] Gloves   [] Particulate Respirator   [] Half/Full Face Elastomeric Respirator   [] PAPR    Patient:    [] Surgical Mask            Patient verbalizes understanding of teaching and instructions. The patient and/or caregiver was educated regarding his/her medical care. No guarantees were made regarding his medical care or treatment outcome. I reviewed past medical, family/social and medication history during the visit. This note was created using the support of [employer] 10.1.  Please note any grammatical, sound-alike, or syntax errors as likely dictation errors.   I have discussed the above plan with Dr. BDrue Stager           Electronically signed by:  KGaylan Gerold PA-C  [facility]  [employer]

## 2021-09-05 NOTE — Nursing Note (Signed)
Vital signs taken, allergies verified, screened for pain, tobacco history verified, pharmacy verified and PPE used during the visit.    Alden Feagan, MA

## 2021-09-09 ENCOUNTER — Other Ambulatory Visit: Payer: Self-pay | Admitting: Family Medicine

## 2021-09-09 ENCOUNTER — Encounter: Payer: Self-pay | Admitting: "Endocrinology

## 2021-09-09 MED ORDER — NYSTATIN 100,000 UNIT/GRAM TOPICAL POWDER: TOPICAL | 2 refills | Status: DC

## 2021-09-09 NOTE — Telephone Encounter (Signed)
From: [name]  To: Minna Merritts, MD  Sent: 09/08/2021 8:45 PM PDT  Subject: BS # with Metformin at night    He Dr. I have attached a screen shot of my numbers after I started taking 1000 mg of Metformin nightly. They stayed up for awhile but seem better now. I have been under a lot of stress (daughter hospitalized but home and sort of better). Plus a staphy infection on my face.   Anyway please let me know how you would like me to proceed. As a side note DJ sent you a message a couple weeks ago but never heard back.   As always thank you for your care.

## 2021-09-09 NOTE — Telephone Encounter (Signed)
Recent Visits  Date Type Provider Dept   07/16/21                               Office Visit                          [name], MD    Future Appointments  Date Type Provider Dept   12/10/21 Appointment [name], MD [facility]

## 2021-09-10 ENCOUNTER — Other Ambulatory Visit: Payer: Self-pay | Admitting: "Endocrinology

## 2021-09-10 DIAGNOSIS — E042 Nontoxic multinodular goiter: Secondary | ICD-10-CM

## 2021-09-19 ENCOUNTER — Telehealth: Payer: Self-pay | Admitting: Neurology

## 2021-09-19 NOTE — Telephone Encounter (Signed)
Patient is calling in and she is requesting to see another provider then [facility] she no longer wants to see him. Please advise.

## 2021-09-23 ENCOUNTER — Telehealth: Payer: Self-pay | Admitting: "Endocrinology

## 2021-09-23 NOTE — Telephone Assessment (Signed)
Paperwork from [facility] for diabetic detailed written order was received and placed in Dr. Yves Dill box to complete.    Thank You    Tenna Delaine, MA

## 2021-09-25 ENCOUNTER — Ambulatory Visit
Admission: RE | Admit: 2021-09-25 | Discharge: 2021-09-25 | Disposition: A | Discharge status: Home / Self Care | Payer: Medicare Other | Source: Ambulatory Visit | Attending: "Endocrinology | Admitting: "Endocrinology

## 2021-09-25 DIAGNOSIS — E042 Nontoxic multinodular goiter: Secondary | ICD-10-CM

## 2021-09-25 NOTE — Telephone Assessment (Signed)
Paperwork from [employer] for diabetic detailed written order was completed by MD and  Faxed to [phone] and [phone] on 09-25-21.    Thank You     Tenna Delaine MA

## 2021-10-01 ENCOUNTER — Ambulatory Visit: Payer: Medicare Other

## 2021-10-09 ENCOUNTER — Ambulatory Visit: Payer: Medicare Other | Admitting: Physician Assistant

## 2021-10-09 VITALS — BP 118/70 | HR 67 | Temp 97.6°F | Wt 162.9 lb

## 2021-10-09 DIAGNOSIS — R21 Rash and other nonspecific skin eruption: Secondary | ICD-10-CM

## 2021-10-09 MED ORDER — METRONIDAZOLE 0.75 % TOPICAL CREAM
TOPICAL_CREAM | Freq: Two times a day (BID) | TOPICAL | 0 refills | Status: DC
Start: 2021-10-09 — End: 2022-04-17

## 2021-10-09 NOTE — Nursing Note (Signed)
Vital signs taken, allergies and pharmacy verified, screened for pain, tobacco hx verified.   Taiyo Kozma, MA

## 2021-10-09 NOTE — Progress Notes (Signed)
[place]     Chief Complaint   Patient presents with    Skin Check     Follow up on sores on face         SUBJECTIVE:  HISTORY     Paula Daugherty is a 76yrold female who presents with the following chief complaint:    Skin check: patient previously treated with rash of face with mupirocin. Rash has improved but she still reports some red spots that have since appeared on her cheek and b/w eyebrows. Additionally feels like her nose is swollen, her husband also notices this. She denies any itching of the rash. No other rashes on the body.    OBJECTIVE:  VITAL SIGNS    weight is 73.9 kg (162 lb 14.7 oz). Her temperature is 36.4 C (97.6 F). Her blood pressure is 118/70 and her pulse is 67.   PHYSICAL     GENERAL APPEARANCE: healthy, alert, no distress, pleasant affect, cooperative.  LUNGS: breathing comfortably on room air.  SKIN:  scattered inflammatory papules of cheeks and forehead  MENTAL STATUS: Logical and goal directed. Euthymic. Cooperative. Normal thought process and concentration. Attentive.      DATA (Reviewed with patient)     Ordering each unique test: see encounter orders    Orders Placed This Encounter    Dermatology Clinic Referral    Metronidazole (METROCREAM) 0.75 % cream         ASSESSMENT AND PLAN     1. Facial rash  Acute; improved after treatment with mupirocin cream, but new spots forming. Now concerning for rosacea. Recommended trial of metronidazole cream and Derm referral, ordered.   - Dermatology Clinic Referral       Patient verbalizes understanding of teaching and instructions. The patient and/or caregiver was educated regarding his/her medical care. No guarantees were made regarding his medical care or treatment outcome. I reviewed past medical, family/social and medication history during the visit. This note was created using the support of [employer] 10.1.  Please note any grammatical, sound-alike, or syntax errors as likely dictation errors.    Supervising Physician: Dr. BDrue Stager         Electronically signed by:  KGaylan Gerold PA-C  [facility] [place]io [place]

## 2021-10-24 ENCOUNTER — Ambulatory Visit: Payer: Medicare Other | Attending: Otolaryngology | Admitting: Otolaryngology

## 2021-10-24 VITALS — BP 133/75 | HR 67 | Temp 96.8°F | Wt 160.5 lb

## 2021-10-24 DIAGNOSIS — H90A21 Sensorineural hearing loss, unilateral, right ear, with restricted hearing on the contralateral side: Secondary | ICD-10-CM | POA: Insufficient documentation

## 2021-10-24 DIAGNOSIS — H90A32 Mixed conductive and sensorineural hearing loss, unilateral, left ear with restricted hearing on the contralateral side: Secondary | ICD-10-CM | POA: Insufficient documentation

## 2021-10-24 DIAGNOSIS — H903 Sensorineural hearing loss, bilateral: Secondary | ICD-10-CM

## 2021-10-24 DIAGNOSIS — H7292 Unspecified perforation of tympanic membrane, left ear: Secondary | ICD-10-CM | POA: Insufficient documentation

## 2021-10-24 DIAGNOSIS — H6123 Impacted cerumen, bilateral: Secondary | ICD-10-CM | POA: Insufficient documentation

## 2021-10-24 DIAGNOSIS — H906 Mixed conductive and sensorineural hearing loss, bilateral: Secondary | ICD-10-CM

## 2021-10-24 DIAGNOSIS — H8093 Unspecified otosclerosis, bilateral: Secondary | ICD-10-CM | POA: Insufficient documentation

## 2021-10-24 NOTE — Nursing Note (Signed)
Vital signs taken, allergies verified, screened for pain.   Avri Paiva, LVN

## 2021-10-24 NOTE — Progress Notes (Signed)
Patient: Paula Daugherty  Location: [facility]  Medical Record Number: [id number] Sex: female Age: 8yr Date of Service: 10/24/2021 Date of Birth: 9May 19, 1947   CHIEF COMPLAINT:  BILATERAL otosclerosis, LEFT TM perforation     HISTORY OF PRESENT ILLNESS:  Paula Spagnuolois a 89yrld female with BILATERAL otosclerosis, LEFT TM perforation, s/p:  - House wire [facility] 1970s[facility] [place]- 2009    INITIAL CONSULTATION 09/12/13:  Pt presenting with a c/o R otalgia. Patient reports 2-3 week intermittent, fleeting, sharp shooting R-sided otalgia with pain sometimes radiating to jaw line. Has experienced same pain on L side x2-3 days. Denies otorrhea/fever/HA. Denies significant change in hearing since onset of pain. Denies pain with opening/closing jaw. Patient endorses h/o GERD.  History of bilateral otosclerosis and bilateral stapedectomies with House wire prosthesis [facility] 1970s[facility] left chronic ear perforation (hx of unsuccessful Paper patch placed in Lt TM 2009).     2019:  She was previously evaluated by Dr. DiFerrel Daugherty 2015 for right otalgia. Otalgia is improved with improved TMJ symptoms. She presents today with worsening word discrimination in the left ear. Otherwise, she has no symptoms. She denies drainage from either ear or vertigo. She does have intermittent high pitched tinnitus in both ears.      2022:  She feels like left hearing and speech discrimination continues to decline. She has trialed hearing aids in the past, most recently 5-6 years ago. No d/d/i/v     2023:  - [facility] has been very happy with them  - no otorrhea in either ear or left ear, no pain  - HAD noted wax and rec ENT f/u; PMD rec cerumen removal gtts, which pt used once on left but had significant pain and dysguesia so stopped;   - no further drainage, pain, bleeding; taste returned to normal    PHYSICAL  EXAMINATION:  General: The patient appears well, is in no distress. Pleasant and cooperative.  Psychiatric: Mood and affect are stable. Oriented to person, place, time and condition.   Dermatologic: Skin warm and dry.  Neurologic: Gait and station normal. Facial nerve intact and symmetric bilaterally. Gaze is conjugate.  Head: Normocephalic, grossly normal appearance. No obvious external lesions.   Respiratory: Breathing comfortably, no shortness of breath or tachypnea. No excessive use of accessory muscles of respiration. No audible stridor or wheezing.  Facial Nerve: intact and symmetrical all branches bilaterally     Right ear:  Auricle normal. EAC cerumen impaction requiring debridement under binocular otomicroscopy..   Left ear:  Auricle normal. EAC cerumen impaction requiring debridement under binocular otomicroscopy..   Binocular otomicroscopy was indicated to better visualize and evaluate the ears bilaterally given the patient's history and physical exam findings.  See binocular otomicroscopy examination findings below.    PROCEDURE:  Bilateral ear examination under binocular otomicroscopy:  Right ear:  EAC with cerumen, removed with 5Fr suction, microalligators. Tympanic membrane clear. Wire loop visible under posterior/superior quadrant. Mobile on positive/negative pneumatic otoscopy.   Left ear:  EAC with cerumen, removed with 5Fr suction, microalligators. Posterior marginal perforation 5-10% with clean edges and no skin ingrowth. Stable from 2015 exam. Wire loop prosthesis seen in posterior/superior quadrant.        AUDIOGRAM:  I personally reviewed audiometry from 09/12/13 and find the following results:        AUDIOGRAM:  I personally reviewed audiometry from 02/25/18 and find the following results:        AUDIOGRAM:  I personally reviewed audiometry from 08/02/20 and find the following results:      AUDIOGRAM:  I personally reviewed audiometry from 01/15/21 and find the following results:              IMPRESSION:  BILATERAL otosclerosis  - s/p bilateral stapedectomies with House wire prosthesis (left in 1970s, right in 1980s at [facility])  - good closure of air bone gaps  - Worsening Left Sided word discrimination from 2015, stable since 2019  LEFT posterior marginal 5-10% perforation with no sign of cholesteatoma ingrowth   LEFT MHL, BILATERAL HF SNHL  - HAs effective             PLAN:  1. Probable diagnoses discussed with the patient.   2. Possible treatment options discussed with the patient.   3. Avoid all gtts and cerumen removal products, given chronic left perf   4. Annual cerumen disimpaction, especially since pt has perf on left  5. Annual audio and f/u; next scheduled in 3-71mo 6. If ABG widens, consider left tympanoplasty; pt agrees  7. Continue HA use and monitor for any otorrhea or infection issues; none so far  8. All questions answered.  Patient is appreciative of care in consultation today.           Total time I spent in care of this patient today (excluding time spent on other billable services) was 25 minutes.  ------------------------------      10/24/2021, 10:17 AM  Electronically Signed By:   RMeryle Ready MD FACS  Professor  Otology, Neurotology, and [facility]  Department of Otolaryngology - Head and Neck Surgery  [facility]

## 2021-10-30 ENCOUNTER — Ambulatory Visit: Payer: Medicare Other | Admitting: Obstetrics & Gynecology

## 2021-10-30 ENCOUNTER — Ambulatory Visit: Payer: Medicare Other

## 2021-11-08 ENCOUNTER — Ambulatory Visit: Payer: Medicare Other | Attending: GASTROENTEROLOGY

## 2021-11-08 ENCOUNTER — Other Ambulatory Visit: Payer: Self-pay | Admitting: Family Medicine

## 2021-11-08 ENCOUNTER — Ambulatory Visit: Payer: Medicare Other | Admitting: Neurology

## 2021-11-08 DIAGNOSIS — J4520 Mild intermittent asthma, uncomplicated: Secondary | ICD-10-CM

## 2021-11-08 DIAGNOSIS — Z794 Long term (current) use of insulin: Secondary | ICD-10-CM | POA: Insufficient documentation

## 2021-11-08 DIAGNOSIS — K7581 Nonalcoholic steatohepatitis (NASH): Secondary | ICD-10-CM | POA: Insufficient documentation

## 2021-11-08 DIAGNOSIS — E1129 Type 2 diabetes mellitus with other diabetic kidney complication: Secondary | ICD-10-CM | POA: Insufficient documentation

## 2021-11-08 LAB — COMPREHENSIVE METABOLIC PANEL
Alanine Transferase (ALT): 23 U/L (ref <=33)
Albumin: 4 g/dL (ref 4.0–4.9)
Alkaline Phosphatase (ALP): 119 U/L (ref 35–129)
Anion Gap: 13 mmol/L (ref 7–15)
Aspartate Transaminase (AST): 23 U/L (ref <=41)
Bilirubin Total: 0.4 mg/dL (ref <=1.2)
Calcium: 9.2 mg/dL (ref 8.6–10.0)
Carbon Dioxide Total: 24 mmol/L (ref 22–29)
Chloride: 107 mmol/L (ref 98–107)
Creatinine Serum: 1.27 mg/dL — ABNORMAL HIGH (ref 0.51–1.17)
E-GFR Creatinine (Female): 45 mL/min/{1.73_m2}
Glucose: 152 mg/dL — ABNORMAL HIGH (ref 74–109)
Potassium: 4.6 mmol/L (ref 3.4–5.1)
Protein: 7.3 g/dL (ref 6.6–8.7)
Sodium: 144 mmol/L (ref 136–145)
Urea Nitrogen, Blood (BUN): 27 mg/dL — ABNORMAL HIGH (ref 6–20)

## 2021-11-08 LAB — INR
INR: 1.08 (ref 0.87–1.18)
Prothrombin Time: 9.9 secs (ref 8.4–10.7)

## 2021-11-08 LAB — CBC WITH DIFFERENTIAL
Basophils % Auto: 0.8 %
Basophils Abs Auto: 0.1 10*3/uL (ref 0.0–0.2)
Eosinophils % Auto: 2.9 %
Eosinophils Abs Auto: 0.2 10*3/uL (ref 0.0–0.5)
Hematocrit: 31.4 % — ABNORMAL LOW (ref 36.0–46.0)
Hemoglobin: 10.1 g/dL — ABNORMAL LOW (ref 12.0–16.0)
Lymphocytes % Auto: 34.1 %
Lymphocytes Abs Auto: 2.2 10*3/uL (ref 1.0–4.8)
MCH: 25.4 pg — ABNORMAL LOW (ref 27.0–33.0)
MCHC: 32 % (ref 32.0–36.0)
MCV: 79.2 fL — ABNORMAL LOW (ref 80.0–100.0)
MPV: 8.1 fL (ref 6.8–10.0)
Monocytes % Auto: 9 %
Monocytes Abs Auto: 0.6 10*3/uL (ref 0.1–0.8)
Neutrophils % Auto: 53.2 %
Neutrophils Abs Auto: 3.4 10*3/uL (ref 1.8–7.7)
Platelet Count: 193 10*3/uL (ref 130–400)
RDW: 16.6 % — ABNORMAL HIGH (ref 0.0–14.7)
Red Blood Cell Count: 3.97 10*6/uL — ABNORMAL LOW (ref 4.00–5.20)
White Blood Cell Count: 6.5 10*3/uL (ref 4.5–11.0)

## 2021-11-08 NOTE — Telephone Encounter (Signed)
Recent Visits  Date Type Provider Dept   10/09/21 Office Visit Darene Lamer, [facility]   09/05/21 Office Visit Darene Lamer, [facility]   07/26/21 Telemedicine Scheduled Dimitri Ped, MD [facility]   07/16/21 Office Visit [name], MD [place]   06/27/21 Office Visit Montemayor, Cherie Ouch, MD [facility]   04/17/21 Office Visit [name], MD [place]   01/16/21 Office Visit [name], MD [facility]   08/14/20 Office Visit [name], MD [place]   06/19/20 Office Visit [name], MD [place] recent visits within past 540 days with a meds authorizing provider and meeting all other requirements  Future Appointments  Date Type Provider Dept   12/10/21 Appointment [name], MD [place] future appointments within next 150 days with a meds authorizing provider and meeting all other requirements

## 2021-11-09 LAB — HEMOGLOBIN A1C
Hgb A1C,Glucose Est Avg: 148 mg/dL
Hgb A1C: 6.8 % — ABNORMAL HIGH (ref 3.9–5.6)

## 2021-11-13 ENCOUNTER — Ambulatory Visit: Payer: Medicare Other | Admitting: Family Medicine

## 2021-11-13 ENCOUNTER — Ambulatory Visit: Payer: Medicare Other | Attending: GASTROENTEROLOGY | Admitting: GASTROENTEROLOGY

## 2021-11-13 VITALS — BP 134/71 | HR 61 | Temp 97.6°F | Ht 62.0 in | Wt 162.7 lb

## 2021-11-13 DIAGNOSIS — R718 Other abnormality of red blood cells: Secondary | ICD-10-CM

## 2021-11-13 DIAGNOSIS — E1349 Other specified diabetes mellitus with other diabetic neurological complication: Secondary | ICD-10-CM

## 2021-11-13 DIAGNOSIS — K3184 Gastroparesis: Secondary | ICD-10-CM | POA: Insufficient documentation

## 2021-11-13 DIAGNOSIS — D500 Iron deficiency anemia secondary to blood loss (chronic): Secondary | ICD-10-CM

## 2021-11-13 DIAGNOSIS — D509 Iron deficiency anemia, unspecified: Secondary | ICD-10-CM | POA: Insufficient documentation

## 2021-11-13 DIAGNOSIS — K7581 Nonalcoholic steatohepatitis (NASH): Secondary | ICD-10-CM | POA: Insufficient documentation

## 2021-11-13 MED ORDER — ONDANSETRON HCL 8 MG TABLET
8.0000 mg | ORAL_TABLET | Freq: Three times a day (TID) | ORAL | 3 refills | Status: DC | PRN
Start: 2021-11-13 — End: 2021-11-20

## 2021-11-13 MED ORDER — HYDROCORTISONE ACETATE 25 MG RECTAL SUPPOSITORY
25.0000 mg | Freq: Two times a day (BID) | RECTAL | 0 refills | Status: DC
Start: 2021-11-13 — End: 2022-02-19

## 2021-11-13 NOTE — Nursing Note (Signed)
Patient is in room #2    Medication list given at the time of check in for review by patient, instructed patient to indicate to the doctor any corrections that need to be made for Medication Reconciliation.    Patient identifiers obtained. vital signs taken, screened for pain, allergies checked, patient roomed, and Georgeanne Nim, MD notified.     Su Monks, MA

## 2021-11-13 NOTE — Progress Notes (Signed)
Gastroenterology and Hepatology Outpatient Progress Note    Patient Name: Paula Daugherty  MRN: [id number]  Date of Service: 11/13/2021   Referring Provider: [name], MD  Attending Physician: Georgeanne Nim MD, MPH    REASON FOR CONSULTATION: NASH and gastroparesis    SUBJECTIVE:  Paula Daugherty is a 77 yr old female with history of DM and obesity BMI 32.  She has fatty liver seen on [place] 10/2015 which showed fat in her liver without splenomegaly and a liver fibrosis score of 12.9 (F3) suggestive of severe fibrosis.  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  She had chronic nausea and vomiting recently diagnosed with gastroparesis on GES.  She has had symptomatic relief from Reglan.  She is also managing with zofran as needed for nausea.    EGD and Colonoscopy 04/2021:    A.        DUODENUM, 2ND PORTION, TATTOOED AREA (BIOPSY):  -   Duodenal mucosa with normal villous architecture and no increased intraepithelial lymphocytes  -   Negative for malignancy              B.        COLON, ASCENDING, POLYP X 2 ( POLYPECTOMY ):  -   Tubular adenoma (2)     C.        COLON, DESCENDING, "POLYP X 2" ( POLYPECTOMY ):  -   One fragment of tubular adenoma (see comment)              D.        RECTUM, POLYP ( POLYPECTOMY ):  -   Hyperplastic polyp     COMMENT: Part C, one fragment of tissue does not survive tissue processing.      Hgb decreased to 10 with microcytosis, some rectal bleeding from hemorrhoids.    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    Psychiatric illness         ALLERGIES:    Contrast Dye [Radiopaque Agent]    Hives  Hydrochlorothiazide    Other-Reaction in Comments    Comment:Patient reports  Januvia [Sitagliptin]    Other-Reaction in Comments    Comment:Patient reports  Keflex [Cephalexin]    Abdominal Pain  Lyrica [Pregabalin]    Other-Reaction in Comments    Comment:Patient reports  Omnipaque [Iohexol]    Other-Reaction in Comments    Comment:Patient reports  Prozac [Fluoxetine Hcl]    Other-Reaction in Comments    Comment:Patient reports  Sulfa (Sulfonamide Antibiotics)    Rash  Topiramate    Other-Reaction in Comments    Comment:Hairfall    MEDICATIONS:  Current Outpatient Medications on File Prior to Visit   Medication Sig Dispense Refill    Albuterol (PROAIR HFA, PROVENTIL HFA, VENTOLIN HFA) 90 mcg/actuation inhaler INHALE 1 TO 2 PUFFS BY MOUTH EVERY 6 HOURS AS NEEDED FOR WHEEZING 8.5 g 0  Amitriptyline (ELAVIL) 10 mg Tablet Take 1 tablet by mouth every day at bedtime. 30 tablet 11    Amlodipine (NORVASC) 10 mg Tablet Take 1 tablet by mouth every day. 90 tablet 3    Atorvastatin (LIPITOR) 10 mg Tablet Take 1 tablet by mouth every day at bedtime. 90 tablet 3    CloNIDine (CATAPRES) 0.1 mg Tablet Take 1 tablet by mouth every day at bedtime. Indications: "change of life" signs 90 tablet 1    Empagliflozin (JARDIANCE) 25 mg Tablet Take 1 tablet by mouth every day. 90 tablet 3    Losartan (COZAAR) 25 mg Tablet Take 1 tablet by mouth every day. 90 tablet 3    Metformin (GLUCOPHAGE) 1,000 mg tablet Take 1 tablet by mouth 2 times daily with meals. (diabetes) 180 tablet 3    Metronidazole (METROCREAM) 0.75 % cream Apply to the affected area 2 times daily. Use a thin layer to affected areas after washing 45 g 0    NYSTATIN 100,000 unit/gram Powder Apply a thin layer of powder to affected area 4 times a day as needed 30 g 2    Omeprazole (PRILOSEC) 20 mg Delayed Release Capsule GENERIC FOR PRILOSEC-- TAKE 1 CAPSULE BY MOUTH TWICE DAILY BEFORE MEALS 180 capsule 1     Ondansetron (ZOFRAN-ODT) 8 mg disintegrating tablet Take 1 tablet by mouth every 8 hours if needed. 100 tablet 1    ONETOUCH ULTRA TEST Strips USE TO TEST BLOOD GLUCOSE TWICE DAILY. 200 strip 3    rimegepant (NURTEC ODT) 75 mg Disintegrating Tablet Take 1 tablet by mouth if needed (Take at start of headache. May take up to once per day.). 15 tablet 3     No current facility-administered medications on file prior to visit.       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  GI: vomiting, nausea, rectal bleeding.    PHYSICAL EXAMINATION:  Temp: 36.4 C (97.6 F) (07/12 1110)  Temp src: Temporal (07/12 1110)  Pulse: 61 (07/12 1110)  BP: 134/71 (07/12 1110)  Resp: --  SpO2: 99 % (07/12 1110)  Height: 157.5 cm (5' 2" ) (07/12 1110)  Weight: 73.8 kg (162 lb 11.2 oz) (07/12 1110)    GENERAL: well appearing, well nourished.    LABORATORY EXAMINATIONS:   Lab Results   Lab Name  Value Date/Time    NA 144 11/08/2021 03:50 PM    NA 142 08/30/2015 08:35 AM    K 4.6 11/08/2021 03:50 PM    K 4.0 08/30/2015 08:35 AM    CL 107 11/08/2021 03:50 PM    CL 104 08/30/2015 08:35 AM    CO2 24 11/08/2021 03:50 PM    CO2 22 (L) 08/30/2015 08:35 AM    BUN 27 (H) 11/08/2021 03:50 PM    BUN 19 08/30/2015  08:35 AM    CR 1.27 (H) 11/08/2021 03:50 PM    CR 0.88 08/30/2015 08:35 AM    GLU 152 (H) 11/08/2021 03:50 PM    GLU 139 (H) 08/30/2015 08:35 AM     Lab Results   Lab Name Value Date/Time    WBC 6.5 11/08/2021 03:50 PM    WBC 8.9 08/30/2015 08:35 AM    HGB 10.1 (L) 11/08/2021 03:50 PM    HGB 14.4 08/30/2015 08:35 AM    HCT 31.4 (L) 11/08/2021 03:50 PM    HCT 44.6 08/30/2015 08:35 AM    PLT 193 11/08/2021 03:50 PM    PLT 217 08/30/2015 08:35 AM     Lab Results   Lab Name Value Date/Time    AST 23 11/08/2021 03:50 PM    AST 71 (H) 08/30/2015 08:35 AM    ALT 23 11/08/2021 03:50 PM    ALT 69 (H) 08/30/2015 08:35 AM    ALP 119 11/08/2021 03:50 PM    ALP 104 08/30/2015 08:35 AM    ALB 4.0 11/08/2021 03:50 PM    ALB 4.2 08/30/2015 08:35 AM    TP 7.3 11/08/2021 03:50 PM    TP 8.5 (H) 08/30/2015 08:35 AM    TBIL 0.4 11/08/2021 03:50 PM    TBIL 0.9 08/30/2015 08:35 AM     Lab Results   Component Value Date    INR 1.08 11/08/2021     Lab Results   Component Value Date    VITD25 34.4 03/17/2016       VIRAL SEROLOGIES:  No results found for this or any previous visit.  Lab Results   Component Value Date    HBSAG Nonreactive 12/08/2013     Lab Results   Component Value Date    HEPBABQT 153.46 12/08/2013     No results found for: ANTIHBCT  No results found for this or any previous visit.  No results found for this or any previous visit.  No results found for this or any previous visit.    Lab Results   Component Value Date    HEPCABSCR Nonreactive 12/08/2013     No results found for this or any previous visit.  No results found for this or any previous visit.  No results found for: HIV1AND2SCR    AUTOIMMUNE:  No results found for: ANASCREEN  No results found for: IGG  No results found for this or any previous visit.  No results found for this or any previous visit.    OTHER:  Lab Results   Component Value Date    FRTN 15 05/07/2021     No results found for this or any previous visit.  No results found for this or any previous  visit.    STUDIES:  Colonoscopy 2015  DIAGNOSIS                                A.   CECUM, POLYP (SNARE  POLYPECTOMY):           -    Fragments of tubular adenoma (see Comment)           -    No high-grade dysplasia or malignancy            B.   RECTUM, ULCER (BIOPSY):           -    Colonic mucosa with focal erosion and reactive epithelial changes           -    No dysplasia or malignancy           -    Deeper levels examined     EGD 02/2016     A.   DUODENUM, POLYP (BIOPSY):           -    Well differentiated neuroendocrine tumor, Grade 1, transected, see                comment           -    Maximum size: 7.5 mm (measured on slide)           -    Ki67 <2%           -    Synaptophysin positive            Comment: This case has been reviewed in intradepartmental consultation.        Assessment and Plan:  1. NASH: based on Fibroscan, she has F3 disease.  Appears stable, diet and exercise advised.    2. Gastroparesis confirmed by gastric emptying study 06/2016: she had symptomatic relief using Reglan, but we cannot continue this long term because of the risk for tardive dyskinesia.  Overall she is managing fine with zofran as needed for nausea.   3. Carcinoid: no evidence of residual disease, follow up with oncology  4. Microcytic anemia: concern for GI bleed.  Recent EGD and Colonoscopy do no reveal a source.  Will treat hemorrhoids with Anusol and order capsule endoscopy.       Orders Placed This Encounter    CBC No Differential    Ferritin    Iron Total    Transferrin    Ondansetron (ZOFRAN) 8 mg Tablet    Hydrocortisone (ANUSOL-HC) 25 mg Suppository     Return in about 6 months (around 05/16/2022).    Georgeanne Nim MD, MPH  Division of Gastroenterology and Hepatology    Number and Complexity of Problems Addressed  2 or more stable chronic illnesses  1 acute, uncomplicated illness or injury    Amount and/or Complexity of Data to be Reviewed and Analyzed  3+ unique tests ordered  Independent interpretation of a test  performed by another physician/other qualified health care professional (not separately reported)    Risk of Complications and/or Morbidity or Mortality of Patient Management  Moderate

## 2021-11-18 ENCOUNTER — Encounter: Payer: Self-pay | Admitting: GASTROENTEROLOGY

## 2021-11-20 ENCOUNTER — Other Ambulatory Visit: Payer: Self-pay | Admitting: GASTROENTEROLOGY

## 2021-11-20 MED ORDER — ONDANSETRON 8 MG DISINTEGRATING TABLET: 8.0000 mg | DISINTEGRATING_TABLET | Freq: Three times a day (TID) | ORAL | 1 refills | Status: DC | PRN

## 2021-11-26 ENCOUNTER — Ambulatory Visit: Payer: Medicare Other | Attending: "Endocrinology | Admitting: "Endocrinology

## 2021-11-26 DIAGNOSIS — E041 Nontoxic single thyroid nodule: Secondary | ICD-10-CM | POA: Insufficient documentation

## 2021-11-26 DIAGNOSIS — E278 Other specified disorders of adrenal gland: Secondary | ICD-10-CM | POA: Insufficient documentation

## 2021-11-26 DIAGNOSIS — Z79899 Other long term (current) drug therapy: Secondary | ICD-10-CM | POA: Insufficient documentation

## 2021-11-26 DIAGNOSIS — Z794 Long term (current) use of insulin: Secondary | ICD-10-CM | POA: Insufficient documentation

## 2021-11-26 DIAGNOSIS — N183 Chronic kidney disease, stage 3 unspecified: Secondary | ICD-10-CM

## 2021-11-26 DIAGNOSIS — E1129 Type 2 diabetes mellitus with other diabetic kidney complication: Secondary | ICD-10-CM | POA: Insufficient documentation

## 2021-11-26 MED ORDER — METFORMIN 1,000 MG TABLET: 1000.0000 mg | ORAL_TABLET | Freq: Two times a day (BID) | ORAL | 3 refills | Status: DC

## 2021-11-26 NOTE — Progress Notes (Signed)
Vital signs taken, allergies verified, screened for pain.   Verified Pharmacy   Downloaded meter today      Kyra Leyland, [place]

## 2021-11-26 NOTE — Progress Notes (Signed)
Endocrinology Follow up clinic note:    Chief Complaint:  "I'm here to follow up on my diabetes."    HPI: Paula Daugherty is a 76yrold female  who has a diagnosis of type 2 diabetes mellitus who presents to Endocrinology for follow up. Her history is as follows:  She was initially diagnosed with diabetes around 2000 on routine labs. She has a strong family history of diabetes so she wasn't surprised at this. She was started on Metformin around 2005 and then she was started on insulin in 2014. She was up to 64 units of Lantus at night. However, after making changes to her diet and lifestyle, she was taken off insulin in 2015. Basal insulin was added back to her regimen in 2018.      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     Interim History: She returns today for follow up. Her last visit with Endocrinology was on 08/14/2021. She states that since this visit, she has been dealing with some additional health issues. She had recent blood work done and was noted to have a drop in her hemoglobin. Along with this, she has noted some significant fatigue. She was previously walking every day but has not been able to do this recently as she feels so run down. She has been following up with her GI doctor for this and will have a capsule endoscopy done soon for follow up. (Last EGD and colonoscopy done in 04/2021). She had some suspected bleeding from hemorrhoids and was started on a suppository for management. Dr. CVirgel Bouquetalso had given her information regarding a clinical trial for NASH and she has been thinking about this further.   Regarding her diabetes, she has noted that her numbers are running slightly higher lately. She denies any recent hypoglycemia or symptoms of hypoglycemia and she is tolerating her current medications well.     Current Regimen:              Jardiance 25 mg daily   Metformin 1000 mg daily  Hypoglycemia: None recently  Hyperglycemia: blood glucose is running higher  100s range  Blood glucose:              AM: 141 to 185              Afternoon: 103  Checks blood glucose: 2x/day  Last eye exam: 12/2020    She had a CT scan of the C-spine done which incidentally  noted thyroid nodules. Dedicated thyroid ultrasound was done in 09/2021 and demonstrated a 0.9 cm left mid-lobe TIRADS-4 nodule.     ROS:  All other systems were reviewed and are negatative except for pertinent positive and negative responses as documented in HPI.     Medications:  Medication reconciliation was performed today.   Current Outpatient Medications on File Prior to Visit   Medication Sig Dispense Refill    Albuterol (PROAIR HFA, PROVENTIL HFA, VENTOLIN HFA) 90 mcg/actuation inhaler INHALE 1 TO 2 PUFFS BY MOUTH EVERY 6 HOURS AS NEEDED FOR WHEEZING 8.5 g 0    Amitriptyline (ELAVIL) 10 mg Tablet Take 1 tablet by mouth every day at bedtime. 30 tablet 11    Amlodipine (NORVASC) 10 mg Tablet Take 1 tablet by mouth every day. 90 tablet 3    Atorvastatin (LIPITOR) 10 mg Tablet Take 1 tablet by mouth every day at bedtime. 90 tablet 3    CloNIDine (CATAPRES) 0.1 mg Tablet Take 1  tablet by mouth every day at bedtime. Indications: "change of life" signs 90 tablet 1    Empagliflozin (JARDIANCE) 25 mg Tablet Take 1 tablet by mouth every day. 90 tablet 3    Hydrocortisone (ANUSOL-HC) 25 mg Suppository Insert 1 suppository into the rectum every 12 hours. 12 suppository 0    Losartan (COZAAR) 25 mg Tablet Take 1 tablet by mouth every day. 90 tablet 3    Metronidazole (METROCREAM) 0.75 % cream Apply to the affected area 2 times daily. Use a thin layer to affected areas after washing 45 g 0    NYSTATIN 100,000 unit/gram Powder Apply a thin layer of powder to affected area 4 times a day as needed 30 g 2    Omeprazole (PRILOSEC) 20 mg Delayed Release Capsule GENERIC FOR PRILOSEC-- TAKE 1 CAPSULE BY MOUTH TWICE DAILY BEFORE MEALS 180 capsule 1    Ondansetron (ZOFRAN-ODT) 8 mg disintegrating tablet Take 1 tablet by mouth every 8 hours  if needed. 100 tablet 1    ONETOUCH ULTRA TEST Strips USE TO TEST BLOOD GLUCOSE TWICE DAILY. 200 strip 3    rimegepant (NURTEC ODT) 75 mg Disintegrating Tablet Take 1 tablet by mouth if needed (Take at start of headache. May take up to once per day.). 15 tablet 3     No current facility-administered medications on file prior to visit.     I did review patient's past medical and family/social history, no changes noted.   PMH:  Past medical history was reviewed from problem list.   Patient Active Problem List   Diagnosis    DM2 (diabetes mellitus, type 2) ([place])    Depression with anxiety    Stress at home    Leg cramps-right calf    Right ear pain    Fibromyalgia    HTN (hypertension)    GERD (gastroesophageal reflux disease)    Hyperlipidemia with target LDL less than 70    Vitamin D insufficiency    Abnormal LFTs    Renal cyst ( 6.4 cm cyst left kidney)    Cough    Fatty liver    Macroalbuminuric diabetic nephropathy (HCC)    History of actinic keratoses    Cataract    Ptosis of eyelid    Liver fibrosis    Adrenal nodule (HCC)    Medication Therapy Auth    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HCC)    Mixed conductive and sensorineural hearing loss of both ears    Neoplasm of uncertain behavior of skin    Abdominal pain, generalized    Nausea without vomiting    Asymptomatic microscopic hematuria    Subcutaneous nodule    Angina pectoris (HCC)    Mild episode of recurrent major depressive disorder (HCC)    NASH (nonalcoholic steatohepatitis)     VITAL SIGNS:  BP 106/65 (SITE: left arm, Orthostatic Position: sitting, Cuff Size: regular)   Pulse 69   Temp 36.1 C (97 F) (Temporal)   Ht 1.575 m (5' 2" )   Wt 73.3 kg (161 lb 9.6 oz)   LMP 05/06/1983   SpO2 100%   BMI 29.56 kg/m   Body mass index is 29.56 kg/m.    PHYSICAL EXAM:  General Appearance: healthy, alert, no distress, pleasant affect, cooperative.  Eyes:  conjunctivae and corneas clear. PERRL, EOM's intact. sclerae normal.  Neck:  Neck supple.  No adenopathy, thyroid midline with no discreet nodules appreciated.   Heart:  normal rate and regular rhythm, no murmurs, clicks, or  gallops.  Lungs: clear to auscultation.  Extremities:  no cyanosis, clubbing, or edema.  Foot Exam: normal DP and PT pulses, no trophic changes or ulcerative lesions, normal sensory exam, and normal monofilament exam.  Skin:  Skin color, texture, turgor normal. No rashes or lesions.  Neuro: No tremor appreciated.   Mental Status: Appearance/Cooperation: in no apparent distress and well developed and well nourished  Eye Contact: normal  Speech: normal volume, rate, and pitch    LAB TESTS/STUDIES:   I personally reviewed the following  laboratory and/or imaging studies .   05/07/21 08:52 08/06/21 08:59 11/08/21 15:50   Sodium 142 141 144   Potassium 4.4 4.6 4.6   Chloride 103 106 107   Carbon Dioxide Total 29 25 24    Anion Gap  10 13   Urea Nitrogen, Blood (BUN) 20 28 (H) 27 (H)   Creatinine Blood 0.96 1.33 (H) 1.27 (H)   Glucose 148 (H) 175 (H) 152 (H)   Calcium 9.5 9.3 9.2   Protein 8.1 7.8 7.3   Albumin 4.5 4.3 4.0   Alkaline Phosphatase (ALP) 144 (H) 158 (H) 119   Aspartate Transaminase (AST) 33 35 23   Bilirubin Total 1.0 0.6 0.4   Alanine Transferase (ALT) 42 (H) 42 (H) 23   E-GFR Creatinine (Female) 59 42 45   Iron Total 58  58     Transferrin 287     Total Iron Binding Capacity 399     Iron Percent Saturation 14.5 (L)     Ferritin 15     Folate 18.6     Thyroid Stimulating Hormone 1.02     ALKALINE PHOSPHATASE ISOENZYME  Rpt !    Hgb A1C 6.7 (H) 7.1 (H) 6.8 (H)   Hgb A1C,Glucose Est Avg 146 157 148   Vitamin B12 691        11/08/21 15:50   White Blood Cell Count 6.5   Red Blood Cell Count 3.97 (L)   Hemoglobin 10.1 (L)   Hematocrit 31.4 (L)   MCV 79.2 (L)   MCH 25.4 (L)   MCHC 32.0   RDW 16.6 (H)   Platelet Count 193      02/05/21 10:17   Creatinine Spot Urine 90.5  90.5   Microalbumin Urine 6.8   Microalbumin/Creatinine Ratio 75     Thyroid ultrasound  (09/25/2021):  FINDINGS:  Parenchymal echotexture: heterogeneous.  Right lobe: 3.9 x 1.4 x 1.9 cm (5.1 cc).  Nodules:  Subcentimeter nodule(s) present but none that meet criteria for FNA or  follow up.  Left lobe: 4.1 x 1.7 x 2.1 cm (6.9 cc).  Nodules:  1. Mid lobe, 0.9 cm, solid or almost completely solid (2 pts),  hypoechoic (2 pts), wider-than-tall (0 pt), with smooth margins (0 pt).  Macrocalcifications: absent (0 pt). Peripheral rim calcification: absent (0  pt). Punctate echogenic foci: none (0 pt). TR4 - Moderately suspicious.  Subcentimeter nodule(s) present but none that meet criteria for FNA or  follow up.  Isthmus: 0.3 cm.  Nodules:  none.  No abnormal cervical lymph nodes.      IMPRESSION:  1. No nodules meet criteria for FNA or follow-up.    04/12/2021:  Component Ref Range & Units     TOTAL VOLUME mL 1620    TIME OF COLLECTION hr 24    DOPAMINE,UR PER VOLUME ug/L 76    DOPAMINE,UR PER 24HR 71 - 485 ug/d 123    DOPAMINE,UR RATIO TO CRT 0 - 250 ug/g CRT  131    NOREPINEPHRINE,UR PER VOLUME ug/L 24    NOREPINEPHRINE,UR PER 24HR 14 - 120 ug/d 39    NOREPINEPHRINE,UR RATIO TO CRT 0 - 45 ug/g CRT 41    EPINEPHRINE,UR PER VOLUME ug/L 1    EPINEPHRINE,UR PER 24 HR 1 - 14 ug/d 2    EPINEPHRINE,UR RATIO TO CRT 0 - 20 ug/g CRT 2    CATECHOLAMINES INTERPRETATION   See Note    CREATININE,UR PER VOLUME mg/dL 58    CREATININE,UR PER 24HR 500 - 1400 mg/d 940          Component Ref Range & Units     TOTAL VOLUME mL 1620    TIME OF COLLECTION hr 24    CORTISOL,UR FREE PER VOLUME ug/L 18.00    CORTISOL,UR FREE PER 24HR <=45.0 ug/d 29.2    CORTISOL,UR FREE RATIO TO CRT ug/g CRT 31.58    Comment: Reference Interval: Cortisol ug/g crt       Component Ref Range & Units     Urine Volume mL 1,620    START DATE   04/11/2021    START TIME    7:30 AM    STOP DATE   04/12/2021    STOP TIME    7:30 AM    COLLECTION INTERVAL, URINE H 24.00    Creatinine Conc mg/dL 53.6    Creatinine 24 Hr 720 - 1,510 mg/24Hr 868       Component Ref Range  & Units     TOTAL VOLUME mL 1620    TIME OF COLLECTION hr 24    METANEPHRINE,UR-PER VOLUME ug/L 65    METANEPHRINE,UR-PER 24HR 36 - 229 ug/d 105    METANEPHRINE,UR-RATIO TO CRT 0 - 300 ug/g CRT 112    NORMETANEPHRINE,UR-PER VOLUME ug/L 224    NORMETANEPHRINE,UR-PER 24HR 95 - 650 ug/d 363    NORMETANEPHRIN,UR-RATIO TO CRT 0 - 400 ug/g CRT 386    METANEPHRINES INTERPRETATION   See Note            02/05/21 10:17   Creatinine Spot Urine 90.5  90.5   Microalbumin Urine 6.8   Microalbumin/Creatinine Ratio 75      CT Abdomen and Pelvis (08/22/2020):  FINDINGS:  Lower Chest: Unremarkable.  Liver: A few scattered small hypodensities too small to characterize. Mild  liver surface nodularity.  Bile Ducts: Unremarkable.  Gallbladder: Unremarkable.  Pancreas: Unremarkable.  Spleen: Unremarkable.  Adrenal Glands: Unremarkable.  Kidneys: Decreased size of left posterior renal cyst with some peripheral  calcifications.  GI Tract: Colonic diverticulosis without diverticulitis. Small soft tissue  nodule anterior to the descending colon previously had fat density and  therefore likely represents an small area of fat process or old torsed  epiploic appendage. Normal appendix.  Peritoneal Cavity: No free fluid or free air.  Bladder: Unremarkable.  Uterus and Ovaries: Status post hysterectomy. This 3.0 cm cystic lesion of  the left adnexa that is either new or increased from the prior. It does not  have simple fluid attenuation.  Lymph Nodes: Prominent upper abdominal lymph nodes are slightly increased  including a 1.7 lymph node anterior to the hepatic artery, previously 1.2  cm  Major Vascular Structures: There is atherosclerosis of the aorta and branch  vessels.  Soft Tissues: Unremarkable.  Musculoskeletal: Unremarkable.     IMPRESSION:  1. Diverticulosis without diverticulitis.  2. Increasing/new 3.0 cm nonsimple fluid cystic lesion of the left  adnexa. Recommend further evaluation with pelvic ultrasound.  3. Liver surface  nodularity  suggesting fibrosis.  4. Slight increased upper abdominal mild lymphadenopathy measuring up  to 1.7 cm is nonspecific. The slow growth argues against an aggressive  process. If patient proves to have chronic liver disease, mild upper  abdominal lymphadenopathy is often associated with this.    Impression: This is a 76yrold female with Diabetes Mellitus Type II who presents to Endocrinology for follow up. Overall, her glycemic control is at goal as evidenced by her most recent A1c  of 6.8%. Her glucose numbers have been consistently >150 in the morning so I have recommended an increase in her metformin dose and she was in agreement with this.      She also has a history of an adrenal nodule. Hormonal evaluation was last done and was normal in 04/2021.      DIABETES HISTORY  (-) h/o retinopathy.      (-) h/o microalbuminuria/nephropathy.  (-) h/o neuropathy.  (-) h/o Autonomic dysfunction  (-) h/o Gastroparesis  (-) h/o CAD  (-) h/o PVD     Last eye exam: 12/2020  Last urine microalbumin: 02/2021, 75  Pneumovax: 12/2014, Prevnar 2018  Influenza vaccine: fall 2022  (-) ASA  (+) Statin  (+) ACE/ARB          She was incidentally noted to have a thyroid nodule on a CT scan on the neck and dedicated thyroid ultrasound demonstrated a 0.9 cm right TIRADS-4 nodule. We discussed options, including no further follow up, FNA and monitoring with ultrasound and after discussion, Ms. MBruskiis in agreement with follow up ultrasound in 1 year's time.     Recommendations:  (E11.29) Type 2 diabetes mellitus with other diabetic kidney complication  (primary encounter diagnosis)  - Continue current dose of Jardiance  - Increase Metformin to 1000 mg BID  - Encouraged dietary changes as the patient has been working on  - Encouraged patient to continue close blood glucose monitoring   - Last LDL at goal, continue statin, repeat due 02/2022  - Last microalbumin:creatinine ratio elevated, continue cozaar, repeat due 02/2022  - Up to date on  screening eye exam, next due 12/2021  - Repeat A1c prior to follow up appointment  - Influenza vaccine and COVID-19 vaccinations up to date  - Discussed daily foot care     2. Adrenal nodule  - Biochemical evaluation normal in 04/2021, stability by imaging in 04/2017  - Hormonal evaluation has been normal x 5 years and may be discontinued at this time    # Thyroid nodule:  - Plan on repeat neck ultrasound in 09/2022 (~1 year from previous) or sooner should the patient notice any changes in her neck, new lumps/bumps or new difficulties swallowing    Approximately 25 minutes were spent with patient, greater than 50% of which was spent counseling the patient on diabetes management and on coordination of care.    Follow up in 3 months    If you have any questions, please do not hesitate to contact me at [phone]  Thank you for allowing me to participate in the care of this patient.    EDUCATION:  I educated/instructed the patient or caregiver regarding all aspects of the above stated plan of care.  The patient or caregiver indicated understanding.      USan Joseinterpreter was not used.    Report electronically signed by:  DSunday Spillers M.D.  Clinical Associate Professor  Department of Endocrinology

## 2021-11-28 ENCOUNTER — Encounter: Payer: Self-pay | Admitting: GASTROENTEROLOGY

## 2021-11-28 ENCOUNTER — Ambulatory Visit: Payer: Medicare Other | Attending: GASTROENTEROLOGY

## 2021-11-28 DIAGNOSIS — R718 Other abnormality of red blood cells: Secondary | ICD-10-CM | POA: Insufficient documentation

## 2021-11-28 LAB — TRANSFERRIN
Iron Percent Saturation: 10.4 % — ABNORMAL LOW (ref 15.0–50.0)
Iron Total: 42 ug/dL (ref 37–158)
Total Iron Binding Capacity: 403 ug/dL — ABNORMAL HIGH (ref 280–400)
Transferrin: 290 mg/dL (ref 200–360)

## 2021-11-28 LAB — CBC NO DIFFERENTIAL
Hematocrit: 34.4 % — ABNORMAL LOW (ref 36.0–46.0)
Hemoglobin: 10.8 g/dL — ABNORMAL LOW (ref 12.0–16.0)
MCH: 24.7 pg — ABNORMAL LOW (ref 27.0–33.0)
MCHC: 31.3 % — ABNORMAL LOW (ref 32.0–36.0)
MCV: 78.7 fL — ABNORMAL LOW (ref 80.0–100.0)
MPV: 8.3 fL (ref 6.8–10.0)
Platelet Count: 218 10*3/uL (ref 130–400)
RDW: 15.8 % — ABNORMAL HIGH (ref 0.0–14.7)
Red Blood Cell Count: 4.37 10*6/uL (ref 4.00–5.20)
White Blood Cell Count: 7 10*3/uL (ref 4.5–11.0)

## 2021-11-28 LAB — IRON TOTAL: Iron Total: 42 ug/dL (ref 37–158)

## 2021-11-28 LAB — FERRITIN: Ferritin: 11 ng/mL — ABNORMAL LOW (ref 13–150)

## 2021-11-29 ENCOUNTER — Other Ambulatory Visit: Payer: Self-pay | Admitting: GASTROENTEROLOGY

## 2021-11-29 DIAGNOSIS — D500 Iron deficiency anemia secondary to blood loss (chronic): Secondary | ICD-10-CM

## 2021-12-03 NOTE — Progress Notes (Signed)
New [facility] infusion appointment     12/03/2021      3:04 PM      This RN was notified by Audrea Muscat, that Dr Virgel Bouquet is requesting patient to be seen at [facility] clinic.      Appointment Treatment Type: IV iron  Treatment for Diagnosis of : iron deficiency anemia due to chronic blood loss    After reviewing patients chart, it was noted that:     1) AIM referral is present  2) Therapy plan is complete: yes  3) Parameters if needed, are met: n/a      This RN has communicated to [place] that the patient MAY be scheduled for an appointment at [facility] clinic.      Clement Husbands RN BSN OCN BMTCN  Clinical Nurse II  Cellular Therapy Unit in [facility]

## 2021-12-04 ENCOUNTER — Ambulatory Visit: Payer: Medicare Other | Admitting: Family Medicine

## 2021-12-06 NOTE — Progress Notes (Signed)
Patient identified by 2 identifiers: Name/DOB  You are scheduled in the [facility]   Appointment date 12/10/21 @ time1400    New Patient: Yes     Your appointment is scheduled in the:      [facility]    [address], [place]   [place], [place]  [address]    If you have the first appointment of the day, please call [phone] when you arrive so staff can meet you downstairs.  Parking is free on site.    Have you been ill with any recent cough, fever above 100 F, SOB, HA, vomiting, diarrhea, abdominal pain? no  If you are ill with a cold or flu please call your PCP prior to your appointment.    Do you currently have any mobility issues?    Face masks are required for patients and visitors.    Space is limited.  Visitors may drop you off in the unit, but will be required to wait in the lobby on the 1st floor for the duration of your appointment.  Visitors under the age of 14 are not allowed.    Please contact [facility] [phone] if you have any questions or need to reschedule.   If you are going to arrive more than 15 minutes late, please call [facility] as soon as possible.  For late arrivals, it may be necessary to reschedule your appointment for later in the day or for another day depending on what treatment you are receiving.      If you arrive late and do not call, we may have to reschedule you once you arrive due to other scheduled appointments.      [place]  [phone]  Monday-Friday  [phone]    [place]  [phone]  7 days/week/365 days a year  [phone]          The [facility] building is listed as "H" on the map and below is a "zoomed in" view of the location

## 2021-12-10 ENCOUNTER — Ambulatory Visit (INDEPENDENT_AMBULATORY_CARE_PROVIDER_SITE_OTHER): Payer: Medicare Other | Admitting: Family Medicine

## 2021-12-10 ENCOUNTER — Encounter: Payer: Self-pay | Admitting: Family Medicine

## 2021-12-10 ENCOUNTER — Ambulatory Visit
Admission: RE | Admit: 2021-12-10 | Discharge: 2021-12-10 | Disposition: A | Discharge status: Home / Self Care | Payer: Medicare Other | Source: Ambulatory Visit | Attending: GASTROENTEROLOGY | Admitting: GASTROENTEROLOGY

## 2021-12-10 VITALS — BP 120/71 | HR 77 | Temp 97.4°F | Resp 16

## 2021-12-10 VITALS — BP 115/65 | HR 66 | Temp 97.4°F | Resp 16 | Wt 162.3 lb

## 2021-12-10 DIAGNOSIS — N1831 Chronic kidney disease, stage 3a: Secondary | ICD-10-CM | POA: Insufficient documentation

## 2021-12-10 DIAGNOSIS — Z794 Long term (current) use of insulin: Secondary | ICD-10-CM

## 2021-12-10 DIAGNOSIS — N281 Cyst of kidney, acquired: Secondary | ICD-10-CM

## 2021-12-10 DIAGNOSIS — D500 Iron deficiency anemia secondary to blood loss (chronic): Secondary | ICD-10-CM | POA: Insufficient documentation

## 2021-12-10 DIAGNOSIS — E1122 Type 2 diabetes mellitus with diabetic chronic kidney disease: Secondary | ICD-10-CM | POA: Insufficient documentation

## 2021-12-10 DIAGNOSIS — R079 Chest pain, unspecified: Secondary | ICD-10-CM

## 2021-12-10 LAB — CBC WITH DIFFERENTIAL
Basophils % Auto: 0.5 %
Basophils Abs Auto: 0 K/MM3 (ref 0.0–0.2)
Eosinophils % Auto: 2.3 %
Eosinophils Abs Auto: 0.1 K/MM3 (ref 0.0–0.5)
Hematocrit: 30.5 % — ABNORMAL LOW (ref 36.0–46.0)
Hemoglobin: 9.8 g/dL — ABNORMAL LOW (ref 12.0–16.0)
Lymphocytes % Auto: 27.8 %
Lymphocytes Abs Auto: 1.7 K/MM3 (ref 1.0–4.8)
MCH: 24.7 pg — ABNORMAL LOW (ref 27.0–33.0)
MCHC: 32.1 % (ref 32.0–36.0)
MCV: 77 fL — ABNORMAL LOW (ref 80.0–100.0)
MPV: 8.1 fL (ref 6.8–10.0)
Monocytes % Auto: 8.5 %
Monocytes Abs Auto: 0.5 K/MM3 (ref 0.1–0.8)
Neutrophils % Auto: 60.9 %
Neutrophils Abs Auto: 3.7 K/MM3 (ref 1.8–7.7)
Platelet Count: 182 K/MM3 (ref 130–400)
RDW: 15.9 % — ABNORMAL HIGH (ref 0.0–14.7)
Red Blood Cell Count: 3.95 M/MM3 — ABNORMAL LOW (ref 4.00–5.20)
White Blood Cell Count: 6.1 K/MM3 (ref 4.5–11.0)

## 2021-12-10 LAB — BASIC METABOLIC PANEL
Anion Gap: 16 mmol/L — ABNORMAL HIGH (ref 7–15)
Calcium: 9.4 mg/dL (ref 8.6–10.0)
Carbon Dioxide Total: 19 mmol/L — ABNORMAL LOW (ref 22–29)
Chloride: 105 mmol/L (ref 98–107)
Creatinine Serum: 1.19 mg/dL — ABNORMAL HIGH (ref 0.51–1.17)
E-GFR Creatinine (Female): 48 mL/min/{1.73_m2}
Glucose: 198 mg/dL — ABNORMAL HIGH (ref 74–109)
Potassium: 4.3 mmol/L (ref 3.4–5.1)
Sodium: 140 mmol/L (ref 136–145)
Urea Nitrogen, Blood (BUN): 26 mg/dL — ABNORMAL HIGH (ref 6–20)

## 2021-12-10 LAB — PARATHYROID HORMONE INTACT: Parathyroid Hormone Intact: 61 pg/mL (ref 15–65)

## 2021-12-10 LAB — PHOSPHORUS (PO4): Phosphorus (PO4): 3.2 mg/dL (ref 2.5–4.5)

## 2021-12-10 LAB — MAGNESIUM (MG): Magnesium (Mg): 1.7 mg/dL (ref 1.6–2.4)

## 2021-12-10 MED ORDER — METHYLPREDNISOLONE SODIUM SUCCINATE 125 MG SOLUTION FOR INJECTION
125.0000 mg | INTRAMUSCULAR | Status: DC | PRN
Start: 2021-12-10 — End: 2021-12-10

## 2021-12-10 MED ORDER — EPINEPHRINE 0.3 MG/0.3 ML INJECTION, AUTO-INJECTOR
0.3000 mg | AUTO-INJECTOR | INTRAMUSCULAR | Status: DC | PRN
Start: 2021-12-10 — End: 2021-12-10

## 2021-12-10 MED ORDER — ACETAMINOPHEN 325 MG TABLET
650.0000 mg | ORAL_TABLET | ORAL | Status: AC | PRN
Start: 2021-12-10 — End: 2021-12-10
  Administered 2021-12-10: 650 mg via ORAL
  Filled 2021-12-10: qty 2

## 2021-12-10 MED ORDER — DIPHENHYDRAMINE 50 MG/ML INJECTION SOLUTION
25.0000 mg | INTRAMUSCULAR | Status: DC | PRN
Start: 2021-12-10 — End: 2021-12-10

## 2021-12-10 MED ORDER — ALBUTEROL SULFATE 2.5 MG/3 ML (0.083 %) SOLUTION FOR NEBULIZATION: 2.5000 mg | INHALATION_SOLUTION | RESPIRATORY_TRACT | Status: DC | PRN

## 2021-12-10 MED ORDER — DIPHENHYDRAMINE 25 MG CAPSULE
25.0000 mg | ORAL_CAPSULE | ORAL | Status: AC | PRN
Start: 2021-12-10 — End: 2021-12-10
  Administered 2021-12-10: 25 mg via ORAL
  Filled 2021-12-10: qty 1

## 2021-12-10 MED ORDER — IRON SUCROSE 100 MG IRON/5 ML INTRAVENOUS SOLUTION
200.0000 mg | Freq: Once | INTRAVENOUS | Status: AC
Start: 2021-12-10 — End: 2021-12-10
  Administered 2021-12-10: 200 mg via INTRAVENOUS
  Filled 2021-12-10: qty 10

## 2021-12-10 MED ORDER — FAMOTIDINE (PF) 20 MG/50 ML IN 0.9 % NACL (ISO) INTRAVENOUS PIGGYBACK
20.0000 mg | INJECTION | INTRAVENOUS | Status: DC | PRN
Start: 2021-12-10 — End: 2021-12-10

## 2021-12-10 NOTE — Patient Instructions (Addendum)
A laboratory test has been ordered for you.  No fasting; Hold A1c.       An ultrasound has been ordered for you Please call [phone] to schedule an appointment in [place]. You will receive a letter or telephone call with your results.      You have been referred to a specialist. We are pleased to offer the services of [facility] specialists located at the [facility] or [place] sites. Please allow 7 business days (3 if urgent) for the referral to be processed. After that time you may call: Cardiology:  [id number], [phone]

## 2021-12-10 NOTE — Nursing Note (Signed)
EKG done on patient per Dr. Jaeger.  Paula Bobeck, MA

## 2021-12-10 NOTE — Progress Notes (Signed)
Patient was NOT wearing a surgical mask  standard precautions were followed when caring for the patient.   PPE used by provider during encounter: Surgical mask  [facility] Progress Note:    Patient arriving to [facility] for scheduled appointment. Patient arriving by ambulation, accompanied by self. Two patient identifers used, name band placed.    Plan of Care Reviewed:     Reason for appointment: IV Venofer  Treatment number/Frequency: 1 of 5 total treatments  [facility] Terms and Condition signed within one year?: Yes    This RN reviewed s/s of allergic reaction with patient, all questions answered. Patient verbalized understanding.     Patient escorted to room. VS obtained. Orders reviewed, appropriate orders released.     IV access type: IV. PIV  Labs drawn: No.  Pre-medications required prior to start of infusion: Yes, PO tylenol 650 mg, PO benadryl 25 mg 30 minutes prior to treatment.   IV Venofer 200 mg administered per order.  Vital signs obtained at end of treatment and stable.    Patient tolerating infusion without issue. Observed 30 minutes post infusion .Patient stable upon leaving [facility] after treatment completion.    Last day of treatment?: no    If no, next appointment confirmed (date scheduled): 12/17/21 at 9:00 AM      Enis Slipper, [place]  [facility], [place]  [phone]

## 2021-12-10 NOTE — Progress Notes (Signed)
Paula Daugherty is a 41yrfemale who presents with a chief complaint of "feeling so tired I dropped the phone".    Chief Complaint   Patient presents with    Blood Pressure    Diabetes       IDA: seeing DR [facility]; iron infusion planned   Feeling tired: also occasional chest pains reported as a constant ache at rest lasting minutes to hours; last episode 2 wks ago; took nitro about 1 month ago; unclear if better with this; sedentary lifestyle   DM2 and CKD: prior renal cyst; occasional elevated BUN but reports drinking plenty of water     ROS:   .Constitutional: no fever/chills.  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 30 tablet 11    Amlodipine (NORVASC) 10 mg Tablet Take 1 tablet by mouth every day. 90 tablet 3    Atorvastatin (LIPITOR) 10 mg Tablet Take 1 tablet by mouth every day at bedtime. 90 tablet 3    CloNIDine (CATAPRES) 0.1 mg Tablet Take 1 tablet by mouth every day at bedtime. Indications: "change of life" signs 90 tablet 1    Empagliflozin (JARDIANCE) 25 mg Tablet Take 1 tablet by mouth every day. 90 tablet 3    Hydrocortisone (ANUSOL-HC) 25 mg Suppository Insert 1 suppository into  the rectum every 12 hours. 12 suppository 0    Losartan (COZAAR) 25 mg Tablet Take 1 tablet by mouth every day. 90 tablet 3    Metformin (GLUCOPHAGE) 1,000 mg tablet Take 1 tablet by mouth 2 times daily with meals. (diabetes) 180 tablet 3    Metronidazole (METROCREAM) 0.75 % cream Apply to the affected area 2 times daily. Use a thin layer to affected areas after washing 45 g 0    NYSTATIN 100,000 unit/gram Powder Apply a thin layer of powder to affected area 4 times a day as needed 30 g 2    Omeprazole (PRILOSEC) 20 mg Delayed Release Capsule GENERIC FOR PRILOSEC-- TAKE 1 CAPSULE BY MOUTH TWICE DAILY BEFORE MEALS 180 capsule 1    Ondansetron (ZOFRAN-ODT) 8 mg disintegrating tablet Take 1 tablet by mouth every 8 hours if needed. 100 tablet 1    ONETOUCH ULTRA TEST Strips USE TO TEST BLOOD GLUCOSE TWICE DAILY. 200 strip 3    rimegepant (NURTEC ODT) 75 mg Disintegrating Tablet Take 1 tablet by mouth if needed (Take at start of headache. May take up  to once per day.). 15 tablet 3     No current facility-administered medications on file prior to visit.     Allergies:   Allergies   Allergen Reactions    Contrast Dye [Radiopaque Agent] Hives    Hydrochlorothiazide Other-Reaction in Comments     Patient reports    Januvia [Sitagliptin] Other-Reaction in Comments     Patient reports    Keflex [Cephalexin] Abdominal Pain    Lyrica [Pregabalin] Other-Reaction in Comments     Patient reports    Omnipaque [Iohexol] Other-Reaction in Comments     Patient reports    Prozac [Fluoxetine Hcl] Other-Reaction in Comments     Patient reports    Sulfa (Sulfonamide Antibiotics) Rash    Topiramate Other-Reaction in Comments     Hairfall       Social History     Socioeconomic History    Marital status: MARRIED    Number of children: 1   Occupational History    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 [employer]    Tobacco Use    Smoking status: Former     Packs/day: 0.10     Years: 20.00     Pack years: 2.00     Types: Cigarettes    Smokeless  tobacco: Never    Tobacco comments:     quit 30 years ago   Substance and Sexual Activity    Alcohol use: Yes     Alcohol/week: 0.0 standard drinks of alcohol     Comment: rare    Drug use: No    Sexual activity: Yes     Partners: Male     Family History   Problem Relation Name Age of Onset    Diabetes Father      Heart Mother          MI at 2s     Non-contributory Brother      Non-contributory Brother         PE:  BP 115/65 (SITE: right arm, Orthostatic Position: sitting, Cuff Size: regular)   Pulse 66   Temp 36.3 C (97.4 F) (Temporal)   Resp 16   Wt 73.6 kg (162 lb 4.1 oz)   LMP 05/06/1983   BMI 29.68 kg/m   .General Appearance: healthy, alert, no distress, pleasant affect, cooperative.  Heart:  normal rate and regular rhythm, no murmurs, clicks, or gallops.  Lungs: clear to auscultation.  Extremities:  no cyanosis, clubbing, or edema.  Mental Status: appropriate behavior and mentation     Assessment and Plan:  (R07.9) Chest pain, unspecified type  (primary encounter diagnosis)  Comment: occasional constant ache with last episode 2 wks ago; unclear diagnosis, etiology or prognosis; ER precautions given; EKG reviewed with patient seems as prior   Plan: Cardiology Referral, STRESS EKG MONITOR,         ECHOCARDIOGRAM COMPLETE, EXERCISE STRESS         ECHOCARDIOGRAM, POC Electrocardiogram,         Transmitted for Interpretation      (E11.22,  N18.31,  Z79.4) Type 2 diabetes mellitus with stage 3a chronic kidney disease, with long-term current use of insulin (HCC)  Comment: chronic; recent IDA not seeming associated with CKD;   Plan: CBC with Differential, Basic Metabolic Panel,         US KIDNEY (RENAL), COMPLETE          (N28.1) Renal cyst ( 6.4 cm cyst left kidney)  Comment: chronic   Plan: US KIDNEY (RENAL),  COMPLETE              No guarantees were made regarding her medical care or treatment outcome. Barriers to Learning: none.  Patient verbalizes understanding of teaching and  instructions.    Electronically signed by:    [name], MD  [place], [place] Board of Family Medicine  Associate physician [facility], [place]   [phone]

## 2021-12-10 NOTE — Nursing Note (Signed)
Patient roomed, chief complaint noted, allergies verified, blood pressure, pulse, respiration, temperature, and weight obtained, screened for pain, and pharmacy verified.    2 Toneshia Coello, M.A.    PPE Used During the Visit:    Eye and Face Protection:          Body Protection:   []Glasses/Goggles    []Gown   []Face Shield   []Surgical Mask   Hand Protection:   []N-95 Mask     []Gloves   []Particulate Respirator   []Half/Full Face Elastomeric Respirator   []PAPR    Patient:    []Surgical Mask  Nainoa Woldt, M.A.

## 2021-12-11 ENCOUNTER — Encounter: Payer: Self-pay | Admitting: "Endocrinology

## 2021-12-11 NOTE — Telephone Encounter (Signed)
From: [name]  To: Minna Merritts, MD  Sent: 12/10/2021 4:53 PM PDT  Subject: Hello. Just wanted to send an FYI. Dr [name] order blood work today. Plus a kidney ultrasound. I wanted to keep you in the loop.     Hope all is good.

## 2021-12-13 ENCOUNTER — Encounter: Payer: Self-pay | Admitting: Neurological Surgery

## 2021-12-13 ENCOUNTER — Ambulatory Visit: Payer: Medicare Other | Admitting: Neurological Surgery

## 2021-12-13 DIAGNOSIS — G4486 Cervicogenic headache: Secondary | ICD-10-CM

## 2021-12-13 DIAGNOSIS — G43719 Chronic migraine without aura, intractable, without status migrainosus: Secondary | ICD-10-CM

## 2021-12-13 DIAGNOSIS — G44321 Chronic post-traumatic headache, intractable: Secondary | ICD-10-CM

## 2021-12-13 DIAGNOSIS — M5030 Other cervical disc degeneration, unspecified cervical region: Secondary | ICD-10-CM

## 2021-12-13 DIAGNOSIS — M5481 Occipital neuralgia: Secondary | ICD-10-CM

## 2021-12-13 DIAGNOSIS — M5412 Radiculopathy, cervical region: Secondary | ICD-10-CM

## 2021-12-13 MED ORDER — NORTRIPTYLINE 10 MG CAPSULE
10.0000 mg | ORAL_CAPSULE | Freq: Every day | ORAL | 11 refills | Status: DC
Start: 2021-12-13 — End: 2022-04-17

## 2021-12-13 NOTE — Progress Notes (Signed)
Patient identified by 2 identifiers: Name/DOB  You are scheduled in the [facility]   Appointment date 12/17/2021 @ 0900am    New Patient: No     Your appointment is scheduled in the:      [facility]   [employer]    [address], [place]   [place], [place]  [address]    If you have the first appointment of the day, please call [phone] when you arrive so staff can meet you downstairs.  Parking is free on site.    Have you been ill with any recent cough, fever above 100 F, SOB, HA, vomiting, diarrhea, abdominal pain? no  If you are ill with a cold or flu please call your PCP prior to your appointment.    Do you currently have any mobility issues?    Face masks are required for patients and visitors.    Space is limited.  Visitors may drop you off in the unit, but will be required to wait in the lobby on the 1st floor for the duration of your appointment.  Visitors under the age of 68 are not allowed.    Please contact [facility] [phone] if you have any questions or need to reschedule.   If you are going to arrive more than 15 minutes late, please call [facility] as soon as possible.  For late arrivals, it may be necessary to reschedule your appointment for later in the day or for another day depending on what treatment you are receiving.      If you arrive late and do not call, we may have to reschedule you once you arrive due to other scheduled appointments.      [place]  [phone]  Monday-Friday  [phone]    [place]  [phone]  7 days/week/365 days a year  [phone]          The [facility] building is listed as "H" on the map and below is a "zoomed in" view of the location            Comments:  Will the patient be needing transportation assistance to this appointment? No  Lyft scheduled for  @     Tammi Klippel RN  [facility] hospital room [phone]

## 2021-12-13 NOTE — Patient Instructions (Signed)
FOR ALL FUTURE VISITS REMEMBER:  -Please arrive 26 MINUTES EARLY  -If you are 15 minutes late, you will need to reschedule the appointment    The neurologist(s) you saw today was(were):   Attending (supervising) physician:  Dr. Vincent Gros    DIAGNOSIS:   Persistent post-traumatic headache  Chronic migraine without aura   Occipital neuralgia vs Cervicogenic headache  Cervical DDD, radiculopathy       Migraine informational poster: [email or web address]    1) The [employer] to improve headache treatment is to live healthy:  -Exercise: goal is around 20 minutes every day, walking or Youtube video inside if bad weather    -Diet: Eat at regular times each day. Do not skip meals. Stay hydrated.  *Caffeine should be 1-2 cups per day or less, if you drink it consistently  -Sleep: Go to bed and get up at the same time everyday, even weekends.  *No phone, ipad/tablet, TV, reading or anything else in bed. Only sleep.  -Stress:  yoga and meditation for stress; counseling therapy is good!    2) Testing:  -I will order EMG/NCS muscle needle nerve test for the neck and arm    3) Medicine to PREVENT headaches:  NORTRIPTYLINE - and old depression/anxiety medicine that helps headaches  -Please drink plenty of water every day.  -Side effects to watch for: mood change, nausea, vomiting, sleepiness, dream changes, dry mouth/skin, constipation, difficulty peeing, blurred vision, tingling in the hands or feet, sexual side effects, sweating, weight gain, confusion, dizziness, rapid heartbeat, rash, seizures (less common), hallucinating (less common)  -Please do not suddenly stop this medication.  Please contact your doctor for her to stop this medication if you have side effects.    4) Rescue pain medicine to RELIEVE a headache:  -Take it as needed. Taking it earlier on during the headache  works better.  -Try NURTEC [facility]) - a pill you take for preventive OR rescue pain  medicine     -Website:  [email or web address]  Potential short-term side effects include:   -Nausea   -Fatigue, drowsy  -Dry mouth  -Allergic reactions (short of breath, rash, etc)     5) Trigger Point Procedure:  -Injecting numbing medication near the nerves of the head, neck, or back (usually bupivacaine or lidocaine)  -It is similar to the Novocaine numbing medicine used at the dentist  -The shots can calm down the nerves, and after the numb part wears off, patients can have a few hours to a few weeks of pain relief (it depends on their individual response)   -It can sometimes help break a headache pain cycle      ==============================    Electromyogram (EMG) and Nerve Conduction Study  What are they?   An electromyogram (EMG) measures the electrical activity of your muscles. Muscles are tested when they aren't in use (at rest) and when they are tightened (muscle contraction).  Nerve conduction studies (NCS) measures how well and how fast the nerves can send electrical signals.  These two tests are often done together. If they are done together, the nerve conduction studies may be done before the EMG.    Why are they done?  An EMG helps find diseases that damage the muscles or nerves. Or an EMG may be done to find out why you can't move muscles (paralysis), why the muscles feel weak, or why they twitch.  Nerve conduction studies find damage to the nerves that lead from the brain and spinal cord to the rest of  the body. Or these tests may be done to find out why you have weakness, numbness, or tingling.    How do you prepare for these tests?   Tell your doctors ALL the medicines, vitamins, supplements, and herbal remedies you take. Some medicines can affect the test results, especially BLOOD THINNERS. You may need to stop some medicines before these tests.     Wear loose-fitting clothing.      The electrodes (stickers) for the test are attached to the skin. The skin needs to be clean and  free of sprays, oils, creams, and lotions.     How are the tests done?  Lying down on a table or bed, or sitting in a reclining chair so that muscles are relaxed.  For an EMG:  The doctor will insert a needle electrode into a muscle. This will record the electrical activity while the muscle is at rest.  Your doctor will ask you to tighten the same muscle slowly and steadily while the electrical activity is recorded.  Your doctor may move the electrode to a different area of the muscle or to a different muscle.  For nerve conduction studies:  Your doctor will attach two types of electrodes (stickers) to skin.  One type is placed over a nerve. It will give the nerve an electrical pulse.  The other type of electrode is placed over the muscle that the nerve controls. It will record how long it takes for the muscle to react to the electrical pulse.    How does it feel?  For an EMG:  You may feel a quick, sharp pain when the needle electrode is put into a muscle.  For nerve conduction studies:  You may feel the electrical pulses. They are small shocks and are safe.    How long do they take?  An EMG may take 30 - 60 minutes.  Nerve conduction tests may take from 15 minutes to 1 hour or more. It depends on how many nerves and muscles your doctor tests.    What happens after these tests?  If any of the test areas are sore:  Put ice or a cold pack on the area for 10 - 20 minutes at a time. Put a thin cloth between the ice and skin.  Ask your doctor if you can use Acetaminophen (Tylenol) or Ibuprofen (Advil, Motrin) for pain. Read and follow all instructions on the label. Too much medicine can be harmful.  You will go home right after the test, and can go back to usual activities.
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
## 2021-12-16 ENCOUNTER — Ambulatory Visit: Payer: Medicare Other | Attending: GASTROENTEROLOGY

## 2021-12-16 DIAGNOSIS — D500 Iron deficiency anemia secondary to blood loss (chronic): Secondary | ICD-10-CM | POA: Insufficient documentation

## 2021-12-16 LAB — CBC NO DIFFERENTIAL
Hematocrit: 36.3 % (ref 36.0–46.0)
Hemoglobin: 11.4 g/dL — ABNORMAL LOW (ref 12.0–16.0)
MCH: 24.8 pg — ABNORMAL LOW (ref 27.0–33.0)
MCHC: 31.5 % — ABNORMAL LOW (ref 32.0–36.0)
MCV: 78.6 fL — ABNORMAL LOW (ref 80.0–100.0)
MPV: 8.4 fL (ref 6.8–10.0)
Platelet Count: 184 10*3/uL (ref 130–400)
RDW: 16 % — ABNORMAL HIGH (ref 0.0–14.7)
Red Blood Cell Count: 4.62 10*6/uL (ref 4.00–5.20)
White Blood Cell Count: 7 10*3/uL (ref 4.5–11.0)

## 2021-12-16 LAB — FERRITIN: Ferritin: 119 ng/mL (ref 13–150)

## 2021-12-17 ENCOUNTER — Encounter: Payer: Self-pay | Admitting: Neurological Surgery

## 2021-12-17 ENCOUNTER — Ambulatory Visit
Admission: RE | Admit: 2021-12-17 | Discharge: 2021-12-17 | Disposition: A | Discharge status: Home / Self Care | Payer: Medicare Other | Source: Ambulatory Visit | Attending: GASTROENTEROLOGY | Admitting: GASTROENTEROLOGY

## 2021-12-17 ENCOUNTER — Telehealth: Payer: Self-pay | Admitting: Adult Health

## 2021-12-17 VITALS — BP 117/70 | HR 72 | Temp 97.5°F | Resp 16

## 2021-12-17 DIAGNOSIS — D500 Iron deficiency anemia secondary to blood loss (chronic): Secondary | ICD-10-CM | POA: Insufficient documentation

## 2021-12-17 MED ORDER — FAMOTIDINE (PF) 20 MG/50 ML IN 0.9 % NACL (ISO) INTRAVENOUS PIGGYBACK
20.0000 mg | INJECTION | INTRAVENOUS | Status: DC | PRN
Start: 2021-12-17 — End: 2021-12-17

## 2021-12-17 MED ORDER — ACETAMINOPHEN 325 MG TABLET
650.0000 mg | ORAL_TABLET | ORAL | Status: AC | PRN
Start: 2021-12-17 — End: 2021-12-17
  Administered 2021-12-17: 650 mg via ORAL
  Filled 2021-12-17: qty 2

## 2021-12-17 MED ORDER — ALBUTEROL SULFATE 2.5 MG/3 ML (0.083 %) SOLUTION FOR NEBULIZATION: 2.5000 mg | INHALATION_SOLUTION | RESPIRATORY_TRACT | Status: DC | PRN

## 2021-12-17 MED ORDER — NACL 0.9% IV INFUSION
INTRAVENOUS | Status: DC
Start: 2021-12-17 — End: 2021-12-17

## 2021-12-17 MED ORDER — EPINEPHRINE 0.3 MG/0.3 ML INJECTION, AUTO-INJECTOR
0.3000 mg | AUTO-INJECTOR | INTRAMUSCULAR | Status: DC | PRN
Start: 2021-12-17 — End: 2021-12-17

## 2021-12-17 MED ORDER — METHYLPREDNISOLONE SODIUM SUCCINATE 125 MG SOLUTION FOR INJECTION
125.0000 mg | INTRAMUSCULAR | Status: DC | PRN
Start: 2021-12-17 — End: 2021-12-17

## 2021-12-17 MED ORDER — DIPHENHYDRAMINE 25 MG CAPSULE
25.0000 mg | ORAL_CAPSULE | ORAL | Status: AC | PRN
Start: 2021-12-17 — End: 2021-12-17
  Administered 2021-12-17: 25 mg via ORAL
  Filled 2021-12-17: qty 1

## 2021-12-17 MED ORDER — DIPHENHYDRAMINE 50 MG/ML INJECTION SOLUTION
25.0000 mg | INTRAMUSCULAR | Status: DC | PRN
Start: 2021-12-17 — End: 2021-12-17

## 2021-12-17 MED ORDER — IRON SUCROSE 100 MG IRON/5 ML INTRAVENOUS SOLUTION
200.0000 mg | Freq: Once | INTRAVENOUS | Status: AC
Start: 2021-12-17 — End: 2021-12-17
  Administered 2021-12-17: 200 mg via INTRAVENOUS
  Filled 2021-12-17: qty 10

## 2021-12-17 NOTE — Telephone Encounter (Signed)
From: [name]  To: Tyrone Nine, MD  Sent: 12/16/2021 9:23 AM PDT  Subject: Pain med    Good morning. I was reviewing the post visit summary and notice that it appears that you did not order the Nurtec. Checking to see if that was your decision? If so I am not questioning it just need to know if I need to stay on top of it. Insurance has approved my getting it if you want me have it.   Thank you. Have a good day.

## 2021-12-17 NOTE — Telephone Encounter (Signed)
[facility] Pre GI procedure call : Left message on patient's voicemail regarding upcoming GI appointment. Instructed to read instructions at least a week prior to appointment and arrive fifteen minutes early.  If any Covid or Flu like symptoms call [facility] until 4:30pm M-F at [phone] option # 3. Instructed to expect to be at their appointment for 1 hour.  Patient is scheduled for a [facility] 08/23.      Lavonda Jumbo, LVN

## 2021-12-17 NOTE — Progress Notes (Addendum)
Patient was NOT wearing a surgical mask  Standard precautions were followed when caring for the patient.   PPE used by provider during encounter: Surgical mask and Gloves    [facility] Progress Note:    Patient arriving to [facility] for scheduled appointment. Patient arriving by ambulation, accompanied by self. Two patient identifers used, name band placed.    Plan of Care Reviewed:     Reason for appointment: IV Venofer 261m  Treatment number/Frequency: 2 of 5 total treatments  [facility] Terms and Condition signed within one year?: Yes    This RN reviewed s/s of allergic reaction with patient, all questions answered. Patient verbalized understanding.     Patient escorted to room. VS obtained. Orders reviewed, appropriate orders released.     IV access type: IV.   Labs drawn: No.  This wProbation officerreached out to Dr. ECassell Smilesvia secure chat during this appointment regarding patients inquiry about having her lab work drawn right before her weekly Iron administration vs. Going to 1-3 days prior to.  Dr CVirgel Bouquetresponded with "Yes of course! Thank you." , This writer called patient via phone number on chart to update her of the above communication with Dr. CVirgel Bouquet     Pre-medications required prior to start of infusion: Yes, Tylenol and Benadryl as ordered. .   IV Venofer administered per order.  Vital signs obtained at end of treatment and stable.    Patient tolerating infusion without issue. Patient stable upon leaving [facility] after treatment completion.    Last day of treatment?: no    If no, next appointment confirmed (date scheduled): 08/22      JGwinda Passe RN  [facility]  [facility] [place] [phone]

## 2021-12-20 NOTE — Progress Notes (Signed)
Patient identified by 2 identifiers: DOB/NAME  You are scheduled in the [place] for Iron Infusion with lab draw   Appointment date 12/24/21 @ [id number]    New Patient: No     Your appointment is scheduled in the:      [facility]    [address] [place]   [place], [place]  [address]    If you have the first appointment of the day, please call [phone] when you arrive so staff can meet you downstairs.  Parking is free on site.    Have you been ill with any recent cough, fever above 100 F, SOB, HA, vomiting, diarrhea, abdominal pain? no  If you are ill with a cold or flu please call your PCP prior to your appointment.    Do you currently have any mobility issues?    Face masks are required for patients and visitors.    Space is limited.  Visitors may drop you off in the unit, but will be required to wait in the lobby on the 1st floor for the duration of your appointment.  Visitors under the age of 61 are not allowed.    Please contact [facility] [phone] if you have any questions or need to reschedule.   If you are going to arrive more than 15 minutes late, please call [facility] as soon as possible.  For late arrivals, it may be necessary to reschedule your appointment for later in the day or for another day depending on what treatment you are receiving.      If you arrive late and do not call, we may have to reschedule you once you arrive due to other scheduled appointments.      [place]  [phone]  Monday-Friday  [phone]    [place]  [phone]  7 days/week/365 days a year  [phone]          The [facility] building is listed as "H" on the map and below is a "zoomed in" view of the location            Comments:  Instructed Pt "OK to take pre med- Tylenol and benadryl po 30 mins prior to appt time, pt confirmed spouse is driving her to appt.

## 2021-12-20 NOTE — Addendum Note (Signed)
Encounter addended by: Ronney Lion, RN on: 12/20/2021 9:39 AM   Actions taken: Pend clinical note

## 2021-12-20 NOTE — Addendum Note (Signed)
Encounter addended by: Ronney Lion, RN on: 12/20/2021 9:49 AM   Actions taken: Clinical Note Signed

## 2021-12-23 ENCOUNTER — Ambulatory Visit
Admission: RE | Admit: 2021-12-23 | Discharge: 2021-12-23 | Disposition: A | Discharge status: Home / Self Care | Payer: Medicare Other | Source: Ambulatory Visit | Attending: GASTROENTEROLOGY | Admitting: GASTROENTEROLOGY

## 2021-12-23 DIAGNOSIS — D500 Iron deficiency anemia secondary to blood loss (chronic): Secondary | ICD-10-CM | POA: Insufficient documentation

## 2021-12-23 DIAGNOSIS — E1349 Other specified diabetes mellitus with other diabetic neurological complication: Secondary | ICD-10-CM

## 2021-12-23 HISTORY — PX: CAPSULE ENDOSCOPY: GILAB00001

## 2021-12-23 NOTE — Nurse Procedure (Signed)
Capsule Endoscopy (PillCam) Note  Provider: Jeral Pinch, NP    Arrival time to the procedure room: 0753 a.m.  I.D. verified (2 identifiers): Name and DOB  Infectious disease (Covid) symptom/travel screening: symptom check today is negative.     Vital signs: BP: 118/64   Pulse: 66  Resp: 16  Temp: 97.56F (Temporal)  Pulse Ox: 98% (room air)  Height: 64f 2in (reported)  Weight: 155 lbs (reported)  Waist measurement (at level of umbilicus): 44 inches    Pain: Yes      Severity: 2/10      Location:all over      Quality: aching      Triggers/Aggravators: too much activity      Alleviators: rest    Allergies: reviewed in EMR  Medications: reviewed in EMR     PMH: reviewed in EMR  Surgical Hx: reviewed in EMR  Social Hx: reviewed in EMR    Drank at least 4L clear liquids yesterday? Yes  NPO for the past 6 hours? Yes  Does patient have a pacemaker or any implanted device?  Yes, "wire implants in my ears"  Any solid food within the past 24 hours? No  MRI scheduled within the next 3 weeks?: No. Not able to have MRI due to implants in ears     Is patient aware that if an MRI is needed within the next 3 weeks, an abdominal x-ray is needed to confirm the capsule has been expelled from the body?: Yes  Patient agrees and verbalized understanding of the procedure/risks/alternatives: Yes    Consent: Yes  Recorder #: [id number]           8 lead Sensor array#   SN [id number]    SN [id number]  XNPY:[id number]#[id number] [id number]    Vital signs obtained and documented. Risks, benefits and alternatives of PillCam capsule endoscopy were discussed including, but not limited to, the risks of capsule retention, obstruction with possible surgical intervention, missed lesion/vessel, allergic reaction to simethicone, and incomplete exam. The patient was queried for procedure contraindications including, but not limited to, swallowing disorder, known gastrointestinal strictures/fistulas, and gastrointestinal obstruction.  Contraindications were not identified. The patient verbalized understanding of the procedure, risks, and discharge instructions. The patient agreed to having the procedure and all questions were answered to their satisfaction. A consent for the procedure was reviewed and signed. The patient drank a solution of 40 mg of liquid simethicone mixed in 8 oz of water. The abdominal sensor equipment and data recorder were outfitted to the patient. The PillCam capsule was confirmed per manufacturer guidelines. Communication of capsule with the recorder was noted, as evidenced by presence of a flashing blue signal on top the recorder and presence of live images on the recorder prior to patient swallowing the capsule. The patient swallowed the capsule, without difficulty, at 8:09 a.m. a.m..Marland Kitchen    Post capsule ingestion discharge instructions were discussed and provided in writing, along with the GI Clinic phone number. The patient was instructed to present to the GI Clinic front desk between 4:09 p.m. and 5:00 pm to return the abdominal sensor equipment and data recorder. The patient verbalized understanding and was then discharged from the procedure room at 8:12 a.m..Assunta Gambles NP  Division of Gastroenterology  PI# [id number]

## 2021-12-23 NOTE — Discharge Instructions (Signed)
PillCam Capsule Endoscopy  **GI Motility Lab: [phone] or [phone]**    You swallowed the capsule at  809 a.m.  At 1009 a.m., you may drink clear liquids (nothing red) and take medications.  At 1209 p.m., you may eat a light lunch (Examples: sandwich, soup, salad, etc.)    During the next 8 hours, it is o.k. to use a computer and mobile phone. You can sit, lie down, and walk, but no strenuous activity (excessive bending, running, etc.) until the capsule recording is complete.  You should not be near electromagnetic fields, as this can cause problems with the metallic parts of the camera.   Do not walk through a Clinical biochemist (i.e, airport, [place], [employer], etc.) while wearing the equipment.    You can use a microwave; however, do not stand in front of it while it is in operation.   No MRI study until the capsule has passed out the body. If you need an urgent MRI (within the next 3 weeks) and you do not know for sure the capsule has exited your body, have the radiology tech obtain an x-ray of your abdomen.  Check the top of the data recorder, about every 30 minutes, to ensure a flashing blue signal is present. This indicates the recorder is still collecting images. If you do not see the blue signal or it changed color, please call the GI Clinic (see top of page for ph. #).  Do not press the buttons on the recorder, remove the belt/leads, or disconnect the equipment while the recorder is still active (indicated by a flashing blue signal).  Approximately 8 hours after swallowing the PillCam capsule, the recorder will beep several times and the flashing blue signal will disappear. This indicates the recording has completed.  The capsule recording should complete ~ 4:09 p.m.Marland Kitchen   After capsule recording is complete, present to the GI Clinic front desk (tomorrow before 8 a.m.) - (no appointment necessary for this return visit). Note: The GI Clinic closes at 5 pm.  If you need parking garage ticket validation,  see the GI Clinic front desk personnel.     Assunta Gambles, NP  Division of Gastroenterology

## 2021-12-24 ENCOUNTER — Other Ambulatory Visit: Payer: Self-pay | Admitting: GASTROENTEROLOGY

## 2021-12-24 ENCOUNTER — Ambulatory Visit
Admission: RE | Admit: 2021-12-24 | Discharge: 2021-12-24 | Disposition: A | Discharge status: Home / Self Care | Payer: Medicare Other | Source: Ambulatory Visit | Attending: GASTROENTEROLOGY | Admitting: GASTROENTEROLOGY

## 2021-12-24 VITALS — BP 108/65 | HR 68 | Temp 97.6°F | Resp 18

## 2021-12-24 DIAGNOSIS — D500 Iron deficiency anemia secondary to blood loss (chronic): Secondary | ICD-10-CM

## 2021-12-24 LAB — CBC NO DIFFERENTIAL
Hematocrit: 35.5 % — ABNORMAL LOW (ref 36.0–46.0)
Hemoglobin: 11.2 g/dL — ABNORMAL LOW (ref 12.0–16.0)
MCH: 24.9 pg — ABNORMAL LOW (ref 27.0–33.0)
MCHC: 31.6 % — ABNORMAL LOW (ref 32.0–36.0)
MCV: 78.7 fL — ABNORMAL LOW (ref 80.0–100.0)
MPV: 8.6 fL (ref 6.8–10.0)
Platelet Count: 119 K/MM3 — ABNORMAL LOW (ref 130–400)
RDW: 18.4 % — ABNORMAL HIGH (ref 0.0–14.7)
Red Blood Cell Count: 4.51 M/MM3 (ref 4.00–5.20)
White Blood Cell Count: 6.5 K/MM3 (ref 4.5–11.0)

## 2021-12-24 MED ORDER — METHYLPREDNISOLONE SODIUM SUCCINATE 125 MG SOLUTION FOR INJECTION
125.0000 mg | INTRAMUSCULAR | Status: DC | PRN
Start: 2021-12-24 — End: 2021-12-24

## 2021-12-24 MED ORDER — EPINEPHRINE 0.3 MG/0.3 ML INJECTION, AUTO-INJECTOR
0.3000 mg | AUTO-INJECTOR | INTRAMUSCULAR | Status: DC | PRN
Start: 2021-12-24 — End: 2021-12-24

## 2021-12-24 MED ORDER — NACL 0.9% IV INFUSION
INTRAVENOUS | Status: DC
Start: 2021-12-24 — End: 2021-12-24

## 2021-12-24 MED ORDER — IRON SUCROSE 100 MG IRON/5 ML INTRAVENOUS SOLUTION
200.0000 mg | Freq: Once | INTRAVENOUS | Status: AC
Start: 2021-12-24 — End: 2021-12-24
  Administered 2021-12-24: 200 mg via INTRAVENOUS
  Filled 2021-12-24: qty 10

## 2021-12-24 MED ORDER — DIPHENHYDRAMINE 50 MG/ML INJECTION SOLUTION
25.0000 mg | INTRAMUSCULAR | Status: DC | PRN
Start: 2021-12-24 — End: 2021-12-24

## 2021-12-24 MED ORDER — ALBUTEROL SULFATE 2.5 MG/3 ML (0.083 %) SOLUTION FOR NEBULIZATION: 2.5000 mg | INHALATION_SOLUTION | RESPIRATORY_TRACT | Status: DC | PRN

## 2021-12-24 MED ORDER — ACETAMINOPHEN 325 MG TABLET
650.0000 mg | ORAL_TABLET | ORAL | Status: DC | PRN
Start: 2021-12-24 — End: 2021-12-24

## 2021-12-24 MED ORDER — FAMOTIDINE (PF) 20 MG/50 ML IN 0.9 % NACL (ISO) INTRAVENOUS PIGGYBACK
20.0000 mg | INJECTION | INTRAVENOUS | Status: DC | PRN
Start: 2021-12-24 — End: 2021-12-24

## 2021-12-24 MED ORDER — DIPHENHYDRAMINE 25 MG CAPSULE
25.0000 mg | ORAL_CAPSULE | ORAL | Status: DC | PRN
Start: 2021-12-24 — End: 2021-12-24

## 2021-12-24 NOTE — Progress Notes (Signed)
Patient was NOT wearing a surgical mask  Standard precautions were followed when caring for the patient.   PPE used by provider during encounter: Surgical mask and Gown  [facility] Progress Note:    Patient arriving to [facility] for scheduled appointment. Patient arriving by ambulation, accompanied by self. Two patient identifers used, name band placed.    Plan of Care Reviewed:     Reason for appointment: for Iron Iv  Treatment number/Frequency: 3 of 5 total treatments  [facility] Terms and Condition signed within one year?: Yes    This RN reviewed s/s of allergic reaction with patient, all questions answered. Patient verbalized understanding.     Patient escorted to room. VS obtained. Orders reviewed, appropriate orders released.     IV access type: IV.   Labs drawn: Yes. CBC, Ferritin  Patient is requesting lab test to be drawn once she  completed the five doses of iron, preferably before 01/19/2022. Message sent to Dr Virgel Bouquet  Pre-medications required prior to start of infusion: Yes, Patient took tylenol ad Benadryl  32mns prior to appointment .   Iron Sucrose (VENOFER) Injection 200 mg  administered per order.  Vital signs obtained at end of treatment and stable.  Completed 369ms observation post infusion  Patient tolerating infusion without issue. Patient stable upon leaving [facility] after treatment completion.      Last day of treatment?: no    If no, next appointment confirmed (date scheduled): 12/31/2021 @ 0945am      ShTammi KlippelRNSchuylerUCEssentia Health Wahpeton [phone]

## 2021-12-25 ENCOUNTER — Ambulatory Visit: Payer: Medicare Other

## 2021-12-26 ENCOUNTER — Encounter: Payer: Self-pay | Admitting: Obstetrics & Gynecology

## 2021-12-26 ENCOUNTER — Ambulatory Visit (INDEPENDENT_AMBULATORY_CARE_PROVIDER_SITE_OTHER): Payer: Medicare Other | Admitting: Obstetrics & Gynecology

## 2021-12-26 ENCOUNTER — Ambulatory Visit
Admission: RE | Admit: 2021-12-26 | Discharge: 2021-12-26 | Disposition: A | Discharge status: Home / Self Care | Payer: Medicare Other | Source: Ambulatory Visit | Attending: Family Medicine | Admitting: Family Medicine

## 2021-12-26 VITALS — BP 116/70 | HR 71 | Resp 18 | Wt 159.4 lb

## 2021-12-26 DIAGNOSIS — Z1231 Encounter for screening mammogram for malignant neoplasm of breast: Secondary | ICD-10-CM

## 2021-12-26 DIAGNOSIS — N83202 Unspecified ovarian cyst, left side: Secondary | ICD-10-CM

## 2021-12-26 DIAGNOSIS — N951 Menopausal and female climacteric states: Secondary | ICD-10-CM

## 2021-12-26 MED ORDER — CLONIDINE HCL 0.1 MG TABLET
0.1000 mg | ORAL_TABLET | Freq: Every day | ORAL | 3 refills | Status: DC
Start: 2021-12-26 — End: 2022-10-15

## 2021-12-26 NOTE — Progress Notes (Signed)
Gyn Note    Chief Complaint   Patient presents with    Follow Up With Specialist     Medication follow up         ASSESSMENT & PLAN      Diagnoses and all orders for this visit:  Cyst of left ovary  -     US PELVIS, TRANSVAG + US PELVIS, TRANSABD, NON-OB, COMPLETE  Vasomotor symptoms due to menopause  Other orders  -     CloNIDine (CATAPRES) 0.1 mg Tablet; Take 1 tablet by mouth every day at bedtime. Indications: "change of life" signs      Modified Medications    Modified Medication Previous Medication    CLONIDINE (CATAPRES) 0.1 MG TABLET CloNIDine (CATAPRES) 0.1 mg Tablet       Take 1 tablet by mouth every day at bedtime. Indications: "change of life" signs    Take 1 tablet by mouth every day at bedtime. Indications: "change of life" signs     - rev options for vasomotor sx's again including risks of VTE.   - doing well with clonidine, suff improvement in vasomotor sx's. Cont for now as doing well and much less risk than HT.   --- rx for clonidine given.   --- in future consider effexor vs gabapentin prn (no prozac)    - left OV cyst stable, no sx's.   --- 1 more repeat US at 52mand if no change then can stop imaging.  --- [place], to schedule early 2023      RTC 163mor med check in         SUGainess a [age]yr P1. She had concerns including Follow Up With Specialist (Medication follow up).    Here for med refill.   Using clonidine for vasomotor sx''s. Has noticied some improvement, enough improvement that is OK continuing this for now vs switching to other med.   Also notes poss helping BP as well.     Has know left ov cyst, No new pelvic or vaginal sx's.     Used prozac in past for mood sx's and did very poorly with SI.       OB History   Gravida Para Term Preterm AB Living   1 1       1    SAB IAB Ectopic Multiple Live Births                  # Outcome Date GA Lbr Len/2nd Weight Sex Delivery Anes PTL Lv   1 Para                - PMH, PSH, FH, SH reviewed in chart.         OBJECTIVE       BP  116/70 (SITE: left arm, Orthostatic Position: sitting, Cuff Size: regular)   Pulse 71   Resp 18   Wt 72.3 kg (159 lb 6.3 oz)   LMP 05/06/1983   SpO2 99%   BMI 29.15 kg/m       Gen- NAD    US-Kore[place]st unchanged on last [place]   Total time I spent in care of this patient today (excluding time spent on other billable services) was 25 minutes.       M PNolon RodD   Assistant Clinical Professor  Obstetrics/Gynecology  PI# [id number]

## 2021-12-27 NOTE — Progress Notes (Signed)
Patient identified by 2 identifiers: Name/DOB  You are scheduled in the [place] for Venofer   Appointment date 12/31/21 @ time 9:45 AM    New Patient: No     Your appointment is scheduled in the:      [facility]   [employer]    [address], [place]   [place], [place]  [address]    If you have the first appointment of the day, please call [phone] when you arrive so staff can meet you downstairs.  Parking is free on site.    Have you been ill with any recent cough, fever above 100 F, SOB, HA, vomiting, diarrhea, abdominal pain? no  If you are ill with a cold or flu please call your PCP prior to your appointment.    Do you currently have any mobility issues?    Face masks are required for patients and visitors.    Space is limited.  Visitors may drop you off in the unit, but will be required to wait in the lobby on the 1st floor for the duration of your appointment.  Visitors under the age of 42 are not allowed.    Please contact [facility] [phone] if you have any questions or need to reschedule.   If you are going to arrive more than 15 minutes late, please call [facility] as soon as possible.  For late arrivals, it may be necessary to reschedule your appointment for later in the day or for another day depending on what treatment you are receiving.      If you arrive late and do not call, we may have to reschedule you once you arrive due to other scheduled appointments.      [place]  [phone]  Monday-Friday  [phone]    [place]  [phone]  7 days/week/365 days a year  [phone]          The [facility] building is listed as "H" on the map and below is a "zoomed in" view of the location            Comments:  Will the patient be needing transportation assistance to this appointment? No  Lyft scheduled for n/a @ [place], [place]  [phone]

## 2021-12-28 LAB — ELECTROCARDIOGRAM WITH RHYTHM STRIP: QTC: 472

## 2021-12-30 ENCOUNTER — Ambulatory Visit: Payer: Medicare Other | Attending: Family Medicine

## 2021-12-30 DIAGNOSIS — I340 Nonrheumatic mitral (valve) insufficiency: Secondary | ICD-10-CM

## 2021-12-30 DIAGNOSIS — I5030 Unspecified diastolic (congestive) heart failure: Secondary | ICD-10-CM

## 2021-12-30 DIAGNOSIS — R079 Chest pain, unspecified: Secondary | ICD-10-CM

## 2021-12-30 DIAGNOSIS — I517 Cardiomegaly: Secondary | ICD-10-CM

## 2021-12-30 DIAGNOSIS — I3481 Nonrheumatic mitral (valve) annulus calcification: Secondary | ICD-10-CM

## 2021-12-31 ENCOUNTER — Other Ambulatory Visit: Payer: Self-pay | Admitting: GASTROENTEROLOGY

## 2021-12-31 ENCOUNTER — Encounter: Payer: Self-pay | Admitting: GASTROENTEROLOGY

## 2021-12-31 ENCOUNTER — Ambulatory Visit
Admission: RE | Admit: 2021-12-31 | Discharge: 2021-12-31 | Disposition: A | Discharge status: Home / Self Care | Payer: Medicare Other | Source: Ambulatory Visit | Attending: GASTROENTEROLOGY | Admitting: GASTROENTEROLOGY

## 2021-12-31 VITALS — BP 117/72 | HR 65 | Temp 98.1°F | Resp 18 | Ht 63.0 in | Wt 158.2 lb

## 2021-12-31 DIAGNOSIS — D500 Iron deficiency anemia secondary to blood loss (chronic): Secondary | ICD-10-CM

## 2021-12-31 LAB — ECHOCARDIOGRAM COMPLETE
IVSD 2D: 1.11 cm (ref 0.6–0.9)
LEFT INTERNAL DIMENSION IN SYSTOLE: 2.7 cm
LEFT VENTRICULAR INTERNAL DIMENSION IN DIASTOLE: 4.6 cm (ref 3.8–5.2)
LVEF (EST): 65 %
POSTERIOR WALL: 1.02 cm (ref 0.6–0.9)

## 2021-12-31 LAB — CBC NO DIFFERENTIAL
Hematocrit: 37 % (ref 36.0–46.0)
Hemoglobin: 11.8 g/dL — ABNORMAL LOW (ref 12.0–16.0)
MCH: 25.7 pg — ABNORMAL LOW (ref 27.0–33.0)
MCHC: 32 % (ref 32.0–36.0)
MCV: 80.3 fL (ref 80.0–100.0)
MPV: 8.1 fL (ref 6.8–10.0)
Platelet Count: 170 10*3/uL (ref 130–400)
RDW: 20.3 % — ABNORMAL HIGH (ref 0.0–14.7)
Red Blood Cell Count: 4.61 10*6/uL (ref 4.00–5.20)
White Blood Cell Count: 5.4 10*3/uL (ref 4.5–11.0)

## 2021-12-31 LAB — FERRITIN: Ferritin: 174 ng/mL — ABNORMAL HIGH (ref 13–150)

## 2021-12-31 MED ORDER — ALBUTEROL SULFATE 2.5 MG/3 ML (0.083 %) SOLUTION FOR NEBULIZATION: 2.5000 mg | INHALATION_SOLUTION | RESPIRATORY_TRACT | Status: DC | PRN

## 2021-12-31 MED ORDER — IRON SUCROSE 100 MG IRON/5 ML INTRAVENOUS SOLUTION
200.0000 mg | Freq: Once | INTRAVENOUS | Status: AC
Start: 2021-12-31 — End: 2021-12-31
  Administered 2021-12-31: 200 mg via INTRAVENOUS
  Filled 2021-12-31: qty 10

## 2021-12-31 MED ORDER — EPINEPHRINE 0.3 MG/0.3 ML INJECTION, AUTO-INJECTOR
0.3000 mg | AUTO-INJECTOR | INTRAMUSCULAR | Status: DC | PRN
Start: 2021-12-31 — End: 2021-12-31

## 2021-12-31 MED ORDER — DIPHENHYDRAMINE 50 MG/ML INJECTION SOLUTION
25.0000 mg | INTRAMUSCULAR | Status: DC | PRN
Start: 2021-12-31 — End: 2021-12-31

## 2021-12-31 MED ORDER — ACETAMINOPHEN 325 MG TABLET
650.0000 mg | ORAL_TABLET | ORAL | Status: DC | PRN
Start: 2021-12-31 — End: 2021-12-31

## 2021-12-31 MED ORDER — FAMOTIDINE (PF) 20 MG/50 ML IN 0.9 % NACL (ISO) INTRAVENOUS PIGGYBACK
20.0000 mg | INJECTION | INTRAVENOUS | Status: DC | PRN
Start: 2021-12-31 — End: 2021-12-31

## 2021-12-31 MED ORDER — METHYLPREDNISOLONE SODIUM SUCCINATE 125 MG SOLUTION FOR INJECTION
125.0000 mg | INTRAMUSCULAR | Status: DC | PRN
Start: 2021-12-31 — End: 2021-12-31

## 2021-12-31 MED ORDER — DIPHENHYDRAMINE 25 MG CAPSULE
25.0000 mg | ORAL_CAPSULE | ORAL | Status: DC | PRN
Start: 2021-12-31 — End: 2021-12-31

## 2021-12-31 NOTE — Progress Notes (Signed)
Patient WAS wearing a surgical mask  Standard precautions were followed when caring for the patient.   PPE used by provider during encounter: Surgical mask and Gloves    [facility] Progress Note:    Patient arriving to [facility] for scheduled appointment. Patient arriving by ambulatory, accompanied by self. Two patient identifers used, name band placed.    Plan of Care Reviewed:     Reason for appointment: IV Iron infusion  Treatment number/Frequency: 4 of 5 total treatments  [facility] Terms and Condition signed within one year?: Yes    This RN reviewed s/s of allergic reaction with patient, all questions answered. Patient verbalized understanding.     Patient escorted to room. VS obtained. Orders reviewed, appropriate orders released.     IV access type: IV.   Labs drawn: Yes.Ferritin, CBC  Pre-medications required prior to start of infusion: Yes, Took Tylenol and Benadryl at home around 0930.   Venofer 200 mg over 5 mins iv push administered per order.  Vital signs obtained at end of treatment and stable.    Patient tolerating infusion without issue. Observed for 30 mins post infusion.Patient stable upon leaving [facility] after treatment completion.    Last day of treatment?: no    If no, next appointment confirmed (date scheduled): 01/07/22 at [place] am in [facility], [place]  [facility], [place]  [phone]

## 2022-01-01 ENCOUNTER — Encounter: Payer: Self-pay | Admitting: "Endocrinology

## 2022-01-01 ENCOUNTER — Ambulatory Visit
Admission: RE | Admit: 2022-01-01 | Discharge: 2022-01-01 | Disposition: A | Discharge status: Home / Self Care | Payer: Medicare Other | Source: Ambulatory Visit | Attending: Family Medicine | Admitting: Family Medicine

## 2022-01-01 DIAGNOSIS — N281 Cyst of kidney, acquired: Secondary | ICD-10-CM

## 2022-01-01 DIAGNOSIS — Z794 Long term (current) use of insulin: Secondary | ICD-10-CM

## 2022-01-01 DIAGNOSIS — E1122 Type 2 diabetes mellitus with diabetic chronic kidney disease: Secondary | ICD-10-CM

## 2022-01-01 DIAGNOSIS — N1831 Chronic kidney disease, stage 3a: Secondary | ICD-10-CM

## 2022-01-01 NOTE — Telephone Encounter (Signed)
From: [name]  To: Minna Merritts, MD  Sent: 01/01/2022 3:32 PM PDT  Subject: Kidney Ultrasound     Hi Dr. I don't know if you are notified of tests Dr [name] orders so...just looping you in. I had a kidney/bladder ultrasound this morning. Results are back already. Dr. [name] has responded but I thought you should see. If I am reading it correctly there are now cysts on both kidneys? Is that new? Do you have any concerns? Thank you for helping.

## 2022-01-07 ENCOUNTER — Ambulatory Visit: Payer: Medicare Other

## 2022-01-09 NOTE — Telephone Encounter (Signed)
Requested Prescriptions     Pending Prescriptions Disp Refills    Cyclobenzaprine (FLEXERIL) 10 mg Tablet 60 tablet 0     Sig: Take 1 tablet by mouth three times daily if needed.       Last visit: 12/30/2021. Future Visit date not found  Medication last prescribed on: 04/04/21     Thank you,  Junie Spencer, MA

## 2022-02-10 ENCOUNTER — Ambulatory Visit (INDEPENDENT_AMBULATORY_CARE_PROVIDER_SITE_OTHER): Payer: Medicare Other

## 2022-02-10 ENCOUNTER — Ambulatory Visit: Payer: Medicare Other | Admitting: Optometrist

## 2022-02-10 ENCOUNTER — Encounter: Payer: Self-pay | Admitting: Dermatology

## 2022-02-10 ENCOUNTER — Ambulatory Visit: Payer: Medicare Other | Attending: "Endocrinology | Admitting: Dermatology

## 2022-02-10 DIAGNOSIS — E1122 Type 2 diabetes mellitus with diabetic chronic kidney disease: Secondary | ICD-10-CM | POA: Insufficient documentation

## 2022-02-10 DIAGNOSIS — N183 Chronic kidney disease, stage 3 unspecified: Secondary | ICD-10-CM

## 2022-02-10 DIAGNOSIS — Z794 Long term (current) use of insulin: Secondary | ICD-10-CM | POA: Insufficient documentation

## 2022-02-10 DIAGNOSIS — I781 Nevus, non-neoplastic: Secondary | ICD-10-CM | POA: Insufficient documentation

## 2022-02-10 DIAGNOSIS — L821 Other seborrheic keratosis: Secondary | ICD-10-CM

## 2022-02-10 DIAGNOSIS — L814 Other melanin hyperpigmentation: Secondary | ICD-10-CM | POA: Insufficient documentation

## 2022-02-10 DIAGNOSIS — D1801 Hemangioma of skin and subcutaneous tissue: Secondary | ICD-10-CM

## 2022-02-10 LAB — COMPREHENSIVE METABOLIC PANEL
Alanine Transferase (ALT): 44 U/L — ABNORMAL HIGH (ref <=33)
Albumin: 4.3 g/dL (ref 4.0–4.9)
Alkaline Phosphatase (ALP): 115 U/L (ref 35–129)
Anion Gap: 14 mmol/L (ref 7–15)
Aspartate Transaminase (AST): 39 U/L (ref <=41)
Bilirubin Total: 0.7 mg/dL (ref <=1.2)
Calcium: 9.7 mg/dL (ref 8.6–10.0)
Carbon Dioxide Total: 26 mmol/L (ref 22–29)
Chloride: 103 mmol/L (ref 98–107)
Creatinine Serum: 1.06 mg/dL (ref 0.51–1.17)
E-GFR Creatinine (Female): 55 mL/min/{1.73_m2}
Glucose: 169 mg/dL — ABNORMAL HIGH (ref 74–109)
Potassium: 4.6 mmol/L (ref 3.4–5.1)
Protein: 7.6 g/dL (ref 6.6–8.7)
Sodium: 143 mmol/L (ref 136–145)
Urea Nitrogen, Blood (BUN): 20 mg/dL (ref 6–20)

## 2022-02-10 LAB — LIPID PANEL
Cholesterol: 143 mg/dL (ref <200)
HDL Cholesterol: 63 mg/dL (ref >40)
LDL Cholesterol Calculation: 44 mg/dL (ref <100)
Non-HDL Cholesterol: 80 mg/dL (ref <=150)
Total Cholesterol: HDL Ratio: 2.3 (ref <4.0)
Triglyceride: 180 mg/dL — ABNORMAL HIGH (ref <150)

## 2022-02-10 LAB — HEMOGLOBIN A1C
Hgb A1C,Glucose Est Avg: 126 mg/dL
Hgb A1C: 6 % — ABNORMAL HIGH (ref 3.9–5.6)

## 2022-02-10 LAB — MICROALBUMIN
Creatinine Spot Urine: 131.3 mg/dL
Microalbumin Urine: 19 mg/dL
Microalbumin/Creatinine Ratio: 145 mg/g

## 2022-02-10 LAB — CREATININE SPOT URINE: Creatinine Spot Urine: 131.3 mg/dL

## 2022-02-10 NOTE — Nursing Note (Signed)
Patient was screened for allergies, tobacco usage and pain level, but no vitals taken per clinician's request.         PPE Used During the Visit:  Eye and Face Protection: Body Protection: Hand Protection:   [x]Glasses/Goggles                                    []Face Shield  []Surgical Mask                          []N-95 Mask                           []Particulate Respirator  []Half/Full Face Elastomeric Respirator  []PAPR                  []Gown []Gloves

## 2022-02-10 NOTE — Progress Notes (Signed)
Visit Date: 02/10/2022    New visit for Paula Daugherty (DOB: Sep 07, 1945), a 76yrold female.    Chief complaint:  FBSE   History obtained from: Patient      Dx: Lentigo  Hx:    sun induced hyperpigmentation present for x years  Exam: few hyperpigmented macules present on the face; several present on both forearms and upper arms; few present on the front of both thighs and legs; few present on the posterior thighs and legs;   Assessment:  lentigo; chronic stable  Plan:  Discussed diagnosis and treatment plan. Patient was reassured of benign nature.    Dx: Seborrheic Keratosis  Hx:    rough, brown papules present for x years  Exam:  few medium brown, waxy, keratotic papules present on the left cheek; few present on the back; few present on the chest;   Assessment:  Seborrheic Keratosis;  chronic stable  Plan:  Discussed diagnosis and treatment plan. Patient was reassured of benign nature.    Dx: CMarcelline Matesangioma  Hx:    red papules present for x years  Exam: 1x bright red dome-shaped papules made up of blood vessels present on the anterior right thigh;   Assessment:  cherry angioma; chronic stable  Plan:  Discussed diagnosis and treatment options. Patient was reassured of benign nature.      Hx of Skin Cancer  -    Fx of Skin Cancer  -    Diagnoses and problems below this line are listed for historical purposes and were not evaluated, discussed, or treated  ----------------------------------------------------------------------------------------------------------------    Return Visit: 2 years for FBSE or as needed      Problem List:   Patient Active Problem List    Diagnosis Date Noted    Adrenal nodule ([place] 12/29/2015     Priority: High     Class: Acute     left      Iron deficiency anemia due to chronic blood loss 11/29/2021    NASH (nonalcoholic steatohepatitis) 08/10/2018    Mild episode of recurrent major depressive disorder (HCl[place] 05/12/2018    Angina pectoris ([place] 09/23/2017     Subcutaneous nodule 07/14/2017    Asymptomatic microscopic hematuria 03/08/2017     7 RBCs; cs consistent with normal flora 03/08/2017       Abdominal pain, generalized 12/07/2016    Nausea without vomiting 12/07/2016    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/20/2016    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HM[place] 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      Liver fibrosis 12/12/2015    Ptosis of eyelid 08/06/2015    Cataract 12/18/2014    History of actinic keratoses 12/06/2014    Macroalbuminuric diabetic nephropathy (HWest [place] 09/12/2014    Fatty liver 08/28/2014     Possible portal HTN--hepatology referral placed 08/28/2014.       Cough 12/14/2013    Renal cyst ( 6.4 cm cyst  left kidney) 08/24/2013    Hyperlipidemia with target LDL less than 70 07/28/2013    Vitamin D insufficiency 07/28/2013    Abnormal LFTs 07/28/2013    DM2 (diabetes mellitus, type 2) ([place]) 07/11/2013     10/15 Diabetes- Dining with Diabetes: Basics  03/10/2014 Diabetes - Recharge Workshop      Depression with anxiety 07/11/2013    Stress at home 07/11/2013    Leg cramps-right calf 07/11/2013    Right ear pain 07/11/2013    Fibromyalgia 07/11/2013    HTN (hypertension) 07/11/2013    GERD (gastroesophageal reflux disease) 07/11/2013        Meds:  List in EMR viewed today    Allergies:    List in EMR viewed today    Labs Reviewed:  -    Records Reviewed:    None    Areas Examined:  full body      Patient Protective Equipment (PPE) Used:  Patient was NOT wearing a surgical mask  Airborne precautions were followed when caring for the patient.   Surgical Mask used by Provider  Mask removed from patient for evaluation or procedure only.    SCRIBE DISCLAIMER:   I, Reuben Likes, a trained medical scribe scribed this note in the presence of Garwin Brothers, M.D.      Electronically Signed By:  Reuben Likes, [place]  02/10/2022  10:26 AM       PROVIDER DISCLAIMER:  This document serves as my personal record of services that I personally performed and was taken in my presence. It was created on  02/10/2022  by the medical scribe noted above.      I have reviewed this document and agree that this note accurately and completely reflects the history and exam findings, the patient care provided, and my medical decision making.     Electronically Signed By:     Garwin Brothers, M.D.  [facility] Department of Dermatology

## 2022-02-19 ENCOUNTER — Encounter: Payer: Self-pay | Admitting: "Endocrinology

## 2022-02-19 ENCOUNTER — Ambulatory Visit: Payer: Medicare Other | Attending: Otolaryngology | Admitting: "Endocrinology

## 2022-02-19 ENCOUNTER — Ambulatory Visit: Payer: Medicare Other | Admitting: Audiologist

## 2022-02-19 VITALS — BP 102/60 | HR 68 | Temp 97.8°F | Ht 62.0 in | Wt 157.6 lb

## 2022-02-19 DIAGNOSIS — E1129 Type 2 diabetes mellitus with other diabetic kidney complication: Secondary | ICD-10-CM

## 2022-02-19 DIAGNOSIS — Z833 Family history of diabetes mellitus: Secondary | ICD-10-CM | POA: Insufficient documentation

## 2022-02-19 DIAGNOSIS — Z7984 Long term (current) use of oral hypoglycemic drugs: Secondary | ICD-10-CM

## 2022-02-19 DIAGNOSIS — H906 Mixed conductive and sensorineural hearing loss, bilateral: Secondary | ICD-10-CM

## 2022-02-19 DIAGNOSIS — H7292 Unspecified perforation of tympanic membrane, left ear: Secondary | ICD-10-CM

## 2022-02-19 DIAGNOSIS — H903 Sensorineural hearing loss, bilateral: Secondary | ICD-10-CM

## 2022-02-19 DIAGNOSIS — E042 Nontoxic multinodular goiter: Secondary | ICD-10-CM

## 2022-02-19 DIAGNOSIS — H8093 Unspecified otosclerosis, bilateral: Secondary | ICD-10-CM

## 2022-02-19 DIAGNOSIS — N1831 Chronic kidney disease, stage 3a: Secondary | ICD-10-CM

## 2022-02-19 DIAGNOSIS — E278 Other specified disorders of adrenal gland: Secondary | ICD-10-CM

## 2022-02-19 NOTE — Progress Notes (Signed)
Endocrinology Follow up clinic note:    Chief Complaint:  "I'm here to follow up on my diabetes."    HPI: Paula Daugherty is a 76yrold female who has a diagnosis of type 2 diabetes mellitus who presents to Endocrinology for follow up. Her history is as follows:  She was initially diagnosed with diabetes around 2000 on routine labs. She has a strong family history of diabetes so she wasn't surprised at this. She was started on Metformin around 2005 and then she was started on insulin in 2014. She was up to 64 units of Lantus at night. However, after making changes to her diet and lifestyle, she was taken off insulin in 2015. Basal insulin was added back to her regimen in 2018.      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     Interim History: She returns today for follow up. Her last visit with Endocrinology was on 08/14/2021. Since this visit, she states that overall, she has been doing ok. She is struggling with lower energy and increased nausea for which she has been taking zofran more often.   She also has noted since her last visit, that she will occasionally have pain in her feet which is associated with muscle cramping and spasms. This was particularly bad last night and the pain woke her up from sleep. Another new symptoms is some itching of the palms and is curious if either of these symptoms are related to her diabetes.   Overall, her blood glucose has been well controlled and she has not had any recent hypoglycemia or symptoms of hypoglycemia.     Current Regimen:              Jardiance 25 mg daily              Metformin 1000 mg BID  Hypoglycemia: None recently  Hyperglycemia: Minimal  Blood glucose:              AM: 135 to 184              Afternoon: 103 to 124  Checks blood glucose: 2x/day  Last eye exam: 12/2020     She had a CT scan of the C-spine done which incidentally  noted thyroid nodules. Dedicated thyroid ultrasound was done in 09/2021 and demonstrated a 0.9 cm  left mid-lobe TIRADS-4 nodule.       ROS:  All other systems were reviewed and are negatative except for pertinent positive and negative responses as documented in HPI.     Medications:  Medication reconciliation was performed today.   Outpatient Medications Marked as Taking for the 02/19/22 encounter (Office Visit) with SCorky Mull MD   Medication Sig Dispense Refill    Albuterol (PROAIR HFA, PROVENTIL HFA, VENTOLIN HFA) 90 mcg/actuation inhaler INHALE 1 TO 2 PUFFS BY MOUTH EVERY 6 HOURS AS NEEDED FOR WHEEZING 8.5 g 0    Amlodipine (NORVASC) 10 mg Tablet Take 1 tablet by mouth every day. 90 tablet 3    Atorvastatin (LIPITOR) 10 mg Tablet Take 1 tablet by mouth every day at bedtime. 90 tablet 3    CloNIDine (CATAPRES) 0.1 mg Tablet Take 1 tablet by mouth every day at bedtime. Indications: "change of life" signs 90 tablet 3    Empagliflozin (JARDIANCE) 25 mg Tablet Take 1 tablet by mouth every day. 90 tablet 3    Losartan (COZAAR) 25 mg Tablet Take 1 tablet by mouth  every day. 90 tablet 3    Metformin (GLUCOPHAGE) 1,000 mg tablet Take 1 tablet by mouth 2 times daily with meals. (diabetes) 180 tablet 3    metoclopramide HCl (REGLAN PO) Take by mouth.      NYSTATIN 100,000 unit/gram Powder Apply a thin layer of powder to affected area 4 times a day as needed 30 g 2    Omeprazole (PRILOSEC) 20 mg Delayed Release Capsule GENERIC FOR PRILOSEC-- TAKE 1 CAPSULE BY MOUTH TWICE DAILY BEFORE MEALS 180 capsule 1    Ondansetron (ZOFRAN-ODT) 8 mg disintegrating tablet Take 1 tablet by mouth every 8 hours if needed. 100 tablet 1    ONETOUCH ULTRA TEST Strips USE TO TEST BLOOD GLUCOSE TWICE DAILY. 200 strip 3     I did review patient's past medical and family/social history, no changes noted.   PMH:  Past medical history was reviewed from problem list.   Patient Active Problem List   Diagnosis    DM2 (diabetes mellitus, type 2) ([place])    Depression with anxiety    Stress at home    Leg cramps-right calf    Right ear  pain    Fibromyalgia    HTN (hypertension)    GERD (gastroesophageal reflux disease)    Hyperlipidemia with target LDL less than 70    Vitamin D insufficiency    Abnormal LFTs    Renal cyst ( 6.4 cm cyst left kidney)    Cough    Fatty liver    Macroalbuminuric diabetic nephropathy (HCC)    History of actinic keratoses    Cataract    Ptosis of eyelid    Liver fibrosis    Adrenal nodule (HCC)    Medication Therapy Auth    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HCC)    Mixed conductive and sensorineural hearing loss of both ears    Neoplasm of uncertain behavior of skin    Abdominal pain, generalized    Nausea without vomiting    Asymptomatic microscopic hematuria    Subcutaneous nodule    Angina pectoris (HCC)    Mild episode of recurrent major depressive disorder (HCC)    NASH (nonalcoholic steatohepatitis)    Iron deficiency anemia due to chronic blood loss     VITAL SIGNS:  BP 102/60 (SITE: left arm, Orthostatic Position: sitting, Cuff Size: large)   Pulse 68   Temp 36.6 C (97.8 F) (Temporal)   Ht 1.575 m (5' 2" )   Wt 71.5 kg (157 lb 10.1 oz)   LMP 05/06/1983   SpO2 100%   BMI 28.83 kg/m   Body mass index is 28.83 kg/m.    PHYSICAL EXAM:  General Appearance: healthy, alert, no distress, pleasant affect, cooperative.  Eyes:  conjunctivae and corneas clear. PERRL, EOM's intact. sclerae normal.  Neck:  Neck supple.   Heart:  normal rate and regular rhythm, no murmurs, clicks, or gallops.  Lungs: clear to auscultation.  Extremities:  no cyanosis, clubbing, or edema.  Foot Exam: normal DP and PT pulses, no trophic changes or ulcerative lesions, normal sensory exam, and normal monofilament exam.  Skin:  Skin color, texture, turgor normal. No rashes or lesions.  Neuro: No tremor appreciated.   Mental Status: Appearance/Cooperation: in no apparent distress and well developed and well nourished  Eye Contact: normal  Speech: normal volume, rate, and pitch    LAB TESTS/STUDIES:   I personally reviewed the  following  laboratory and/or imaging studies .   12/31/21 09:50 02/10/22 09:19  Sodium  143   Potassium  4.6   Chloride  103   Carbon Dioxide Total  26   Anion Gap  14   Urea Nitrogen, Blood (BUN)  20   Creatinine Blood  1.06   Glucose  169 (H)   Calcium  9.7   Protein  7.6   Albumin  4.3   Alkaline Phosphatase (ALP)  115   Aspartate Transaminase (AST)  39   Bilirubin Total  0.7   Alanine Transferase (ALT)  44 (H)   E-GFR Creatinine (Female)  55   Cholesterol  143   Triglyceride  180 (H)   LDL Cholesterol Calculation  44   HDL Cholesterol  63   Non-HDL Cholesterol  80   Total Cholesterol:HDL Ratio  2.3   Ferritin 174 (H)    Hgb A1C  6.0 (H)   Hgb A1C,Glucose Est Avg  126      12/31/21 09:50   White Blood Cell Count 5.4   Red Blood Cell Count 4.61   Hemoglobin 11.8 (L)   Hematocrit 37.0   MCV 80.3   MCH 25.7 (L)   MCHC 32.0   RDW 20.3 (H)   Platelet Count 170      02/10/22 09:30   Creatinine Spot Urine 131.3  131.3   Microalbumin Urine 19.0   Microalbumin/Creatinine Ratio 145     Thyroid ultrasound (09/25/2021):  FINDINGS:  Parenchymal echotexture: heterogeneous.  Right lobe: 3.9 x 1.4 x 1.9 cm (5.1 cc).  Nodules:  Subcentimeter nodule(s) present but none that meet criteria for FNA or  follow up.  Left lobe: 4.1 x 1.7 x 2.1 cm (6.9 cc).  Nodules:  1. Mid lobe, 0.9 cm, solid or almost completely solid (2 pts),  hypoechoic (2 pts), wider-than-tall (0 pt), with smooth margins (0 pt).  Macrocalcifications: absent (0 pt). Peripheral rim calcification: absent (0  pt). Punctate echogenic foci: none (0 pt). TR4 - Moderately suspicious.  Subcentimeter nodule(s) present but none that meet criteria for FNA or  follow up.  Isthmus: 0.3 cm.  Nodules:  none.  No abnormal cervical lymph nodes.      IMPRESSION:  1. No nodules meet criteria for FNA or follow-up.     04/12/2021:  Component Ref Range & Units     TOTAL VOLUME mL 1620    TIME OF COLLECTION hr 24    DOPAMINE,UR PER VOLUME ug/L 76    DOPAMINE,UR PER 24HR 71 - 485 ug/d 123     DOPAMINE,UR RATIO TO CRT 0 - 250 ug/g CRT 131    NOREPINEPHRINE,UR PER VOLUME ug/L 24    NOREPINEPHRINE,UR PER 24HR 14 - 120 ug/d 39    NOREPINEPHRINE,UR RATIO TO CRT 0 - 45 ug/g CRT 41    EPINEPHRINE,UR PER VOLUME ug/L 1    EPINEPHRINE,UR PER 24 HR 1 - 14 ug/d 2    EPINEPHRINE,UR RATIO TO CRT 0 - 20 ug/g CRT 2    CATECHOLAMINES INTERPRETATION   See Note    CREATININE,UR PER VOLUME mg/dL 58    CREATININE,UR PER 24HR 500 - 1400 mg/d 940          Component Ref Range & Units     TOTAL VOLUME mL 1620    TIME OF COLLECTION hr 24    CORTISOL,UR FREE PER VOLUME ug/L 18.00    CORTISOL,UR FREE PER 24HR <=45.0 ug/d 29.2    CORTISOL,UR FREE RATIO TO CRT ug/g CRT 31.58    Comment: Reference Interval: Cortisol ug/g crt  Component Ref Range & Units     Urine Volume mL 1,620    START DATE   04/11/2021    START TIME    7:30 AM    STOP DATE   04/12/2021    STOP TIME    7:30 AM    COLLECTION INTERVAL, URINE H 24.00    Creatinine Conc mg/dL 53.6    Creatinine 24 Hr 720 - 1,510 mg/24Hr 868       Component Ref Range & Units     TOTAL VOLUME mL 1620    TIME OF COLLECTION hr 24    METANEPHRINE,UR-PER VOLUME ug/L 65    METANEPHRINE,UR-PER 24HR 36 - 229 ug/d 105    METANEPHRINE,UR-RATIO TO CRT 0 - 300 ug/g CRT 112    NORMETANEPHRINE,UR-PER VOLUME ug/L 224    NORMETANEPHRINE,UR-PER 24HR 95 - 650 ug/d 363    NORMETANEPHRIN,UR-RATIO TO CRT 0 - 400 ug/g CRT 386    METANEPHRINES INTERPRETATION   See Note          CT Abdomen and Pelvis (08/22/2020):  FINDINGS:  Lower Chest: Unremarkable.  Liver: A few scattered small hypodensities too small to characterize. Mild  liver surface nodularity.  Bile Ducts: Unremarkable.  Gallbladder: Unremarkable.  Pancreas: Unremarkable.  Spleen: Unremarkable.  Adrenal Glands: Unremarkable.  Kidneys: Decreased size of left posterior renal cyst with some peripheral  calcifications.  GI Tract: Colonic diverticulosis without diverticulitis. Small soft tissue  nodule anterior to the descending colon previously had  fat density and  therefore likely represents an small area of fat process or old torsed  epiploic appendage. Normal appendix.  Peritoneal Cavity: No free fluid or free air.  Bladder: Unremarkable.  Uterus and Ovaries: Status post hysterectomy. This 3.0 cm cystic lesion of  the left adnexa that is either new or increased from the prior. It does not  have simple fluid attenuation.  Lymph Nodes: Prominent upper abdominal lymph nodes are slightly increased  including a 1.7 lymph node anterior to the hepatic artery, previously 1.2  cm  Major Vascular Structures: There is atherosclerosis of the aorta and branch  vessels.  Soft Tissues: Unremarkable.  Musculoskeletal: Unremarkable.     IMPRESSION:  1. Diverticulosis without diverticulitis.  2. Increasing/new 3.0 cm nonsimple fluid cystic lesion of the left  adnexa. Recommend further evaluation with pelvic ultrasound.  3. Liver surface nodularity suggesting fibrosis.  4. Slight increased upper abdominal mild lymphadenopathy measuring up  to 1.7 cm is nonspecific. The slow growth argues against an aggressive  process. If patient proves to have chronic liver disease, mild upper  abdominal lymphadenopathy is often associated with this.    Impression: This is a 77yrold female with Diabetes Mellitus Type II who presents to Endocrinology for follow up. Overall, her glycemic control is at goal as evidenced by her most recent A1c of 6%. Her glucose has overall been well controlled with minimal hypoglycemia. She is curious if she can decrease any of her medications and given her well controlled A1c, we discussed that it is reasonable to try to decrease metformin by 500 mg per day with the goal of titrating down further if blood glucose allows.      She also has a history of an adrenal nodule. Hormonal evaluation was last done and was normal in 04/2021.      DIABETES HISTORY  (-) h/o retinopathy.      (-) h/o microalbuminuria/nephropathy.  (-) h/o neuropathy.  (-) h/o Autonomic  dysfunction  (-) h/o Gastroparesis  (-)  h/o CAD  (-) h/o PVD     Last eye exam: 12/2020, has f/up scheduled 03/2022  Last urine microalbumin: 02/2022, 145  Pneumovax: 12/2014, Prevnar 2018  Influenza vaccine: fall 2022  (-) ASA  (+) Statin  (+) ACE/ARB          She was incidentally noted to have a thyroid nodule on a CT scan on the neck and dedicated thyroid ultrasound demonstrated a 0.9 cm right TIRADS-4 nodule. We discussed options, including no further follow up, FNA and monitoring with ultrasound and after discussion, Ms. Gatti is in agreement with follow up ultrasound in 1 year's time.      Recommendations:  (E11.29) Type 2 diabetes mellitus with other diabetic kidney complication  (primary encounter diagnosis)  - Continue current dose of Jardiance  - Decrease Metformin to  500 mg every morning and 1000 mg every evening  - Encouraged dietary changes as the patient has been working on  - Encouraged patient to continue close blood glucose monitoring   - Last LDL at goal, continue statin, repeat due 02/2023  - Last microalbumin:creatinine ratio elevated, continue cozaar, repeat due 02/2023  - Up to date on screening eye exam, f/up as scheduled in 03/2022  - Repeat A1c prior to follow up appointment  - Influenza vaccine and COVID-19 vaccinations up to date  - Discussed daily foot care     2. Adrenal nodule  - Biochemical evaluation normal in 04/2021, stability by imaging in 04/2017  - Hormonal evaluation has been normal x 5 years and may be discontinued at this time     # Thyroid nodule:  - Plan on repeat neck ultrasound in 09/2022 (~1 year from previous) or sooner should the patient notice any changes in her neck, new lumps/bumps or new difficulties swallowing    Approximately 25 minutes were spent with patient, greater than 50% of which was spent counseling the patient on diabetes and on coordination of care.    Follow up in 3 months    If you have any questions, please do not hesitate to contact me at  [phone].  Thank you for allowing me to participate in the care of this patient.    EDUCATION:  I educated/instructed the patient or caregiver regarding all aspects of the above stated plan of care.  The patient or caregiver indicated understanding.      Fate interpreter was not used.    Report electronically signed by:  Sunday Spillers, M.D.  Clinical Associate Professor  Department of Endocrinology

## 2022-02-19 NOTE — Nursing Note (Signed)
Vital signs taken, allergies verified, screened for pain.   Verified Pharmacy   [facility] today    Verner Chol, [place]

## 2022-02-19 NOTE — Procedures (Signed)
Audiologic Evaluation    Paula Daugherty, a 76yrold female, was seen today for a periodic audiologic evaluation. The patient has a history of bilateral otosclerosis, stapedectomies, and chronic left tympanic membrane perforation. [facility]. She denied otalgia and otorrhea.    Audiogram:  Pure tone air and bone conduction testing revealed mild to profound mixed hearing loss in the right ear. Testing of the left ear revealed moderate to severe mixed hearing loss. There were air-bone gaps of 15-50 dB HL at [phone] & 4000 Hz in the left ear and 20 dB HL at 4000 Hz in the right ear. Word recognition scores were excellent (92%) in the right ear and very good (88%) in the left ear. Since the previous hearing evaluation on 08/02/2020, hearing sensitivity has not significantly changed.    Recommendations/Counseling:  1) Counseled regarding today's results.  2) Follow-up with ENT as scheduled.   3) Repeat audiogram per ENT or sooner if concerns arise.    Report Electronically Signed By:  SNelda Marseille Au.D.  Clinical Audiologist  ENT-Otolaryngology

## 2022-03-04 ENCOUNTER — Encounter: Payer: Self-pay | Admitting: Family Medicine

## 2022-03-04 NOTE — Telephone Encounter (Signed)
Acknowledge and Encounter closed.  FYI to MD.

## 2022-03-04 NOTE — Telephone Encounter (Signed)
From: [name]  To: [name], MD  Sent: 03/04/2022 4:40 PM PDT  Subject: Radonna Ricker Test results     Hello. I wanted to ensure that you have seen all the results from the test ordered?  DJ is having heart surgery this Thursday afternoon to put in place 1-2 stents for the blockage that was found. Dr says they will not know for sure what all they will be doing until they are inside.   We just wanted you to know.     [name]

## 2022-03-19 ENCOUNTER — Ambulatory Visit: Payer: Medicare Other | Admitting: Family Medicine

## 2022-03-21 ENCOUNTER — Ambulatory Visit: Payer: Medicare Other

## 2022-04-01 ENCOUNTER — Encounter: Payer: Self-pay | Admitting: Family Medicine

## 2022-04-01 ENCOUNTER — Ambulatory Visit (INDEPENDENT_AMBULATORY_CARE_PROVIDER_SITE_OTHER): Payer: Vision Other Private Insurance | Admitting: Optometrist

## 2022-04-01 ENCOUNTER — Ambulatory Visit: Payer: Vision Other Private Insurance | Admitting: Optometrist

## 2022-04-01 ENCOUNTER — Ambulatory Visit: Payer: Medicare Other | Admitting: Family Medicine

## 2022-04-01 VITALS — BP 116/72 | HR 80 | Temp 98.4°F | Resp 16 | Wt 160.9 lb

## 2022-04-01 DIAGNOSIS — Z135 Encounter for screening for eye and ear disorders: Secondary | ICD-10-CM

## 2022-04-01 DIAGNOSIS — Z794 Long term (current) use of insulin: Secondary | ICD-10-CM

## 2022-04-01 DIAGNOSIS — Z9842 Cataract extraction status, left eye: Secondary | ICD-10-CM

## 2022-04-01 DIAGNOSIS — Z973 Presence of spectacles and contact lenses: Secondary | ICD-10-CM

## 2022-04-01 DIAGNOSIS — Z961 Presence of intraocular lens: Secondary | ICD-10-CM

## 2022-04-01 DIAGNOSIS — H52203 Unspecified astigmatism, bilateral: Secondary | ICD-10-CM

## 2022-04-01 DIAGNOSIS — Z9841 Cataract extraction status, right eye: Secondary | ICD-10-CM

## 2022-04-01 DIAGNOSIS — I10 Essential (primary) hypertension: Secondary | ICD-10-CM

## 2022-04-01 DIAGNOSIS — N281 Cyst of kidney, acquired: Secondary | ICD-10-CM

## 2022-04-01 DIAGNOSIS — N1831 Chronic kidney disease, stage 3a: Secondary | ICD-10-CM

## 2022-04-01 DIAGNOSIS — H33321 Round hole, right eye: Secondary | ICD-10-CM

## 2022-04-01 DIAGNOSIS — E119 Type 2 diabetes mellitus without complications: Secondary | ICD-10-CM

## 2022-04-01 DIAGNOSIS — H43811 Vitreous degeneration, right eye: Secondary | ICD-10-CM

## 2022-04-01 DIAGNOSIS — E1122 Type 2 diabetes mellitus with diabetic chronic kidney disease: Secondary | ICD-10-CM

## 2022-04-01 DIAGNOSIS — H524 Presbyopia: Secondary | ICD-10-CM

## 2022-04-01 NOTE — Nursing Note (Signed)
Patient roomed, chief complaint noted, allergies verified, blood pressure, pulse, respiration, temperature, and weight obtained, screened for pain, and pharmacy verified.    2 Christionna Poland, M.A.    PPE Used During the Visit:    Eye and Face Protection:          Body Protection:   []Glasses/Goggles    []Gown   []Face Shield   []Surgical Mask   Hand Protection:   []N-95 Mask     []Gloves   []Particulate Respirator   []Half/Full Face Elastomeric Respirator   []PAPR    Patient:    []Surgical Mask  Louanna Vanliew, M.A.

## 2022-04-01 NOTE — Progress Notes (Signed)
Paula Daugherty is a 44yrfemale who presents with a chief complaint of "we have an eye appointment next door".    Chief Complaint   Patient presents with    Blood Pressure    Diabetes       DM2: due for eye exam this afternoon with Kaaryn; A1c at goal   CKD: renal ultrasound with chronic changes; left kidney cyst also seeming stable   3. HTN: She reports .taking medications as instructed, no medication side effects noted, no TIA's, no chest pain on exertion, no dyspnea on exertion, no swelling of ankles; blood pressure reads: none    ROS:   .Constitutional: no fever/chills.  CV: no chest pain, palpitations, shortness of breath or edema      Past Medical History:   Diagnosis Date    Angina pectoris ([place] 09/23/2017    Anxiety     Asthma     Cirrhosis ([place]     Constipation, chronic     Depression     Diabetes mellitus ([place]     Fatty liver     Hypertension     Kidney disease 2015    cyst left kidney per pt    Liver fibrosis     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/20/2016    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[place] 04/17/2016    Psychiatric illness        Current Outpatient Medications on File Prior to Visit   Medication Sig Dispense Refill    Albuterol (PROAIR HFA, PROVENTIL HFA, VENTOLIN HFA) 90 mcg/actuation inhaler INHALE 1 TO 2 PUFFS BY MOUTH EVERY 6 HOURS AS NEEDED FOR WHEEZING 8.5 g 0    Amlodipine (NORVASC) 10 mg Tablet Take 1 tablet by mouth every day. 90 tablet 3    Atorvastatin (LIPITOR) 10 mg Tablet Take 1 tablet by mouth every day at bedtime. 90 tablet 3    CloNIDine (CATAPRES) 0.1 mg Tablet Take 1 tablet by mouth every day at bedtime. Indications: "change of life" signs 90 tablet 3    Empagliflozin (JARDIANCE) 25 mg Tablet Take 1 tablet by mouth every day. 90 tablet 3    Iron Sucrose (VENOFER) - Infusion Med Placeholder This is a placeholder order to keep the medication list accurate. See therapy plan for details.      Losartan (COZAAR) 25 mg Tablet Take 1 tablet by mouth every day. 90 tablet  3    Metformin (GLUCOPHAGE) 1,000 mg tablet Take 1 tablet by mouth 2 times daily with meals. (diabetes) 180 tablet 3    metoclopramide HCl (REGLAN PO) Take by mouth.      Metronidazole (METROCREAM) 0.75 % cream Apply to the affected area 2 times daily. Use a thin layer to affected areas after washing 45 g 0    Nortriptyline (PAMELOR, AVENTYL) 10 mg Capsule Take 1 capsule by mouth every day at bedtime. 30 capsule 11    NYSTATIN 100,000 unit/gram Powder Apply a thin layer of powder to affected area 4 times a day as needed 30 g 2    Omeprazole (PRILOSEC) 20 mg Delayed Release Capsule GENERIC FOR PRILOSEC-- TAKE 1 CAPSULE BY MOUTH TWICE DAILY BEFORE MEALS 180 capsule 1    Ondansetron (ZOFRAN-ODT) 8 mg disintegrating tablet Take 1 tablet by mouth every 8 hours if needed. 100 tablet 1    ONETOUCH ULTRA TEST Strips USE TO TEST BLOOD GLUCOSE TWICE DAILY. 200 strip 3    rimegepant (NURTEC ODT) 75 mg Disintegrating Tablet Take 1 tablet by mouth  if needed (Take at start of headache. May take up to once per day.). 15 tablet 3     No current facility-administered medications on file prior to visit.     Allergies:   Allergies   Allergen Reactions    Sulfa (Sulfonamide Antibiotics) Rash    Contrast Dye [Radiopaque Agent] Hives    Hydrochlorothiazide Other-Reaction in Comments     Patient reports    Januvia [Sitagliptin] Other-Reaction in Comments     Patient reports    Keflex [Cephalexin] Abdominal Pain    Lyrica [Pregabalin] Other-Reaction in Comments     Patient reports    Omnipaque [Iohexol] Other-Reaction in Comments     Patient reports    Prozac [Fluoxetine Hcl] Other-Reaction in Comments     Patient reports    Topiramate Other-Reaction in Comments     Hairfall       Social History     Socioeconomic History    Marital status: MARRIED    Number of children: 1   Occupational History    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 [employer]    Tobacco Use    Smoking status: Former     Packs/day: 0.10     Years: 20.00     Additional pack  years: 0.00     Total pack years: 2.00     Types: Cigarettes    Smokeless tobacco: Never    Tobacco comments:     quit 30 years ago   Substance and Sexual Activity    Alcohol use: Yes     Alcohol/week: 0.0 standard drinks of alcohol     Comment: rare    Drug use: No    Sexual activity: Yes     Partners: Male     Family History   Problem Relation Name Age of Onset    Diabetes Father      Heart Mother          MI at 15s     Non-contributory Brother      Non-contributory Brother         PE:  BP 116/72 (SITE: right arm, Orthostatic Position: sitting, Cuff Size: regular)   Pulse 80   Temp 36.9 C (98.4 F) (Temporal)   Resp 16   Wt 73 kg (160 lb 15 oz)   LMP 05/06/1983   BMI 29.44 kg/m   .General Appearance: healthy, alert, no distress, pleasant affect, cooperative.  Heart:  normal rate and regular rhythm, no murmurs, clicks, or gallops.  Lungs: clear to auscultation.  Extremities:  no cyanosis, clubbing, or edema.  Mental Status: appropriate behavior and mentation     Assessment and Plan:  (E11.22,  N18.31,  Z79.4) Type 2 diabetes mellitus with stage 3a chronic kidney disease, with long-term current use of insulin (HCC)  (primary encounter diagnosis)  Comment: chronic; at goal   Plan: no changes made     (N28.1) Renal cyst ( 6.4 cm cyst left kidney)  Comment: stable per recent ultrasound   Plan: to monitor     (I10) Primary hypertension  Comment: chronic and at goal   Plan: continue Losartan and Amlodipine     No guarantees were made regarding her medical care or treatment outcome. Barriers to Learning: none.  Patient verbalizes understanding of teaching and instructions.    Electronically signed by:    [name], MD  [place], [place] [employer]  Associate physician [facility], [place]   [phone]

## 2022-04-02 NOTE — Progress Notes (Signed)
Chief Complaint   Patient presents with    Eye Exam     76 year old female here for CEE and glasses check. CC: Pt reports stable vision. BS is well controlled, taking Metformin.        Lab Results   Lab Name Value Date/Time    HGBA1C 6.0 (H) 02/10/2022 09:19 AM    HGBA1C 6.8 (H) 11/08/2021 03:50 PM    HGBA1C 7.1 (H) 08/06/2021 08:59 AM    HGBA1C 6.6 (H) 08/30/2015 08:35 AM    HGBA1C 7.7 (H) 03/23/2015 09:04 AM    HGBA1C 6.9 (H) 12/06/2014 11:45 AM         Pertinent Personal Medical History:  See EMR-No changes  DM    Past Ocular History:   CE OU  Blepharoplasty OU    Ocular Medications:  none      Assessment/Plan:     1. Presbyopia.  Mild astigmatism OU. Released a glasses prescription to the patient, if desired.     2. Status post cataract extraction OU. Stable.     3. 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  No evidence of diabetic retinopathy nor clinically significant macular edema was present in either eye.  I emphasized the long-term risk to vision from diabetes and the importance of strict glycemic control, blood pressure control, along with regular eye exams to detect diabetic retinopathy at an early stage to prevent vision loss. She should return in one year for a dilated eye examination, or sooner if any changes are noticed.    4. Operculated retinal hole inferior/temporal OD- mild pigment surround. No retinal detachment. Pt is asymptomatic. The patient was educated on the signs and symptoms of a retinal detachment including floaters, flashes of light, curtains or shadows, and a decrease in visual acuity. The patient expressed understanding and will call immediately if any changes in symptoms occur.     Return to clinic in one year for a general eye examination or sooner if any changes are noticed.      Paula Daugherty, O.D, F.A.A.O.  Physiological scientist

## 2022-04-02 NOTE — Progress Notes (Signed)
Diabetic retinal screening- covered with VSP

## 2022-04-17 ENCOUNTER — Ambulatory Visit
Payer: Medicare Other | Attending: GASTROENTEROLOGY | Admitting: Student in an Organized Health Care Education/Training Program

## 2022-04-17 ENCOUNTER — Encounter: Payer: Self-pay | Admitting: GASTROENTEROLOGY

## 2022-04-17 ENCOUNTER — Encounter: Payer: Self-pay | Admitting: Student in an Organized Health Care Education/Training Program

## 2022-04-17 ENCOUNTER — Ambulatory Visit (INDEPENDENT_AMBULATORY_CARE_PROVIDER_SITE_OTHER): Payer: Medicare Other

## 2022-04-17 VITALS — BP 98/53 | HR 70 | Temp 97.5°F | Ht <= 58 in | Wt 161.6 lb

## 2022-04-17 DIAGNOSIS — M5441 Lumbago with sciatica, right side: Secondary | ICD-10-CM | POA: Insufficient documentation

## 2022-04-17 DIAGNOSIS — L304 Erythema intertrigo: Secondary | ICD-10-CM | POA: Insufficient documentation

## 2022-04-17 DIAGNOSIS — I10 Essential (primary) hypertension: Secondary | ICD-10-CM | POA: Insufficient documentation

## 2022-04-17 DIAGNOSIS — N289 Disorder of kidney and ureter, unspecified: Secondary | ICD-10-CM | POA: Insufficient documentation

## 2022-04-17 DIAGNOSIS — D500 Iron deficiency anemia secondary to blood loss (chronic): Secondary | ICD-10-CM | POA: Insufficient documentation

## 2022-04-17 LAB — CBC NO DIFFERENTIAL
Hematocrit: 24.7 % — ABNORMAL LOW (ref 36.0–46.0)
Hemoglobin: 7.8 g/dL — ABNORMAL LOW (ref 12.0–16.0)
MCH: 24.4 pg — ABNORMAL LOW (ref 27.0–33.0)
MCHC: 31.6 % — ABNORMAL LOW (ref 32.0–36.0)
MCV: 77.3 fL — ABNORMAL LOW (ref 80.0–100.0)
MPV: 7.5 fL (ref 6.8–10.0)
Platelet Count: 264 10*3/uL (ref 130–400)
RDW: 17.1 % — ABNORMAL HIGH (ref 0.0–14.7)
Red Blood Cell Count: 3.19 10*6/uL — ABNORMAL LOW (ref 4.00–5.20)
White Blood Cell Count: 5.5 10*3/uL (ref 4.5–11.0)

## 2022-04-17 LAB — FERRITIN: Ferritin: 9 ng/mL — ABNORMAL LOW (ref 13–150)

## 2022-04-17 MED ORDER — NYSTATIN 100,000 UNIT/GRAM TOPICAL POWDER: TOPICAL | 2 refills | Status: DC

## 2022-04-17 NOTE — Nursing Note (Signed)
Vital signs taken, allergies verified, screened for pain, tobacco hx verified.  Paula Rader Morales Juarez, MA I     I have asked the patient if they have received any medical treatment or consultation outside of the [facility].  The patient has indicated (yes or no) to receiving consults or services outside the [facility] system.  If yes, we have requested information to ascertain these results.     Patient WAS wearing a surgical mask  Contact precautions were followed when caring for the patient.

## 2022-04-17 NOTE — Patient Instructions (Signed)
For pain you can do the following at home:   - Tylenol 1,000 mg up to every 8 hours as needed   - Continue heat therapy and ice therapy   - topical voltaren gel (available over the counter)    - lidocaine patches  (available over the counter)

## 2022-04-17 NOTE — Progress Notes (Signed)
Paula Daugherty  Family Medicine     Chief Complaint   Patient presents with    Establish Care    Back Pain         ASSESSMENT and PLAN:  1. Acute bilateral low back pain with ride sided sciatica   Assessment: acute, new. ddx includes DDD, lumbar radiculopathy, piriformis syndrome, SI joint dysfunction, sciatica etc.  Exam most consistent w sciatica. Discussed management options including pain control, physical therapy, home exercises, imaging.    Plan:   - Start physical therapy   - home exercises provided   - consider xrays at follow up if ongoing   - counseled on pain control with tylenol, ice/heat therapy, topical Voltaren and lidocaine patches   - avoid nsaids due to hx of kidney disease   - follow up 2-3 months, if no improvement consider referral to spine program and MRI's for evaluation of possible injections or nerve ablation   - consider referral to integrative medicine for ongoing pain control   - Physical Therapy Referral    2. Intertrigo  Established, stable. Refill provided.   - NYSTATIN 100,000 unit/gram Powder; Apply a thin layer of powder to affected area 4 times a day as needed  Dispense: 30 g; Refill: 2    3. Primary hypertension  Established, stable. Continue amlodipine 10 and losartan 25 daily for BP.     SUBJECTIVE:  Paula Daugherty is a 76yrold female  here for chief complaint noted above.    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 CVirgel Bouquet- sees him end of month     Sees endocrinology for DM - metformin and jardiance     Cholesterol - lipitor     Uses clonidine for hot flashes per Dr. AOuida Sills    Uses reglan and zofran for stomach pain and nausea w/ Dr. CVirgel Bouquet    Uses omeprazole for reflux     Hx of carcinoid tumor - cancer free for 5 years      Reviewed and updated as appropriateI reviewed and appropriately updated her lists of current problems, past medical/surgical history, and social history. :     ROS:  As noted in HPI. 10 point review of systems was otherwise negative.     Current Outpatient Medications   Medication Sig    Albuterol (PROAIR HFA, PROVENTIL HFA, VENTOLIN HFA) 90 mcg/actuation inhaler INHALE 1 TO 2 PUFFS BY MOUTH EVERY 6 HOURS AS NEEDED  FOR WHEEZING    Amlodipine (NORVASC) 10 mg Tablet Take 1 tablet by mouth every day.    Atorvastatin (LIPITOR) 10 mg Tablet Take 1 tablet by mouth every day at bedtime.    CloNIDine (CATAPRES) 0.1 mg Tablet Take 1 tablet by mouth every day at bedtime. Indications: "change of life" signs    Empagliflozin (JARDIANCE) 25 mg Tablet Take 1 tablet by mouth every day.    Iron Sucrose (VENOFER) - Infusion Med Placeholder This is a placeholder order to keep the medication list accurate. See therapy plan for details.    Losartan (COZAAR) 25 mg Tablet Take 1 tablet by mouth every day.    Metformin (GLUCOPHAGE) 1,000 mg tablet Take 1 tablet by mouth 2 times daily with meals. (diabetes)    metoclopramide HCl (REGLAN PO) Take by mouth.    Metronidazole (METROCREAM) 0.75 % cream Apply to the affected area 2 times daily. Use a thin layer to affected areas after washing    Nortriptyline (PAMELOR, AVENTYL) 10 mg Capsule Take 1 capsule by mouth every day at bedtime.    NYSTATIN 100,000  unit/gram Powder Apply a thin layer of powder to affected area 4 times a day as needed    Omeprazole (PRILOSEC) 20 mg Delayed Release Capsule GENERIC FOR PRILOSEC-- TAKE 1 CAPSULE BY MOUTH TWICE DAILY BEFORE MEALS    Ondansetron (ZOFRAN-ODT) 8 mg disintegrating tablet Take 1 tablet by mouth every 8 hours if needed.    ONETOUCH ULTRA TEST Strips USE TO TEST BLOOD GLUCOSE TWICE DAILY.    rimegepant (NURTEC ODT) 75 mg Disintegrating Tablet Take 1 tablet by mouth if needed (Take at start of headache. May take up to once per day.).     No current facility-administered medications for this visit.        Allergies as of 04/17/2022 - Reviewed 04/17/2022   Allergen Reaction Noted    Sulfa (sulfonamide antibiotics) Rash 07/11/2013    Contrast dye [radiopaque agent] Hives 07/11/2013    Hydrochlorothiazide Other-Reaction in Comments 07/11/2013    Januvia [sitagliptin] Other-Reaction in Comments 07/11/2013    Keflex [cephalexin] Abdominal Pain 07/12/2020    Lyrica [pregabalin] Other-Reaction in Comments 07/11/2013    Omnipaque [iohexol] Other-Reaction in Comments 07/11/2013    Prozac [fluoxetine hcl] Other-Reaction in Comments 07/11/2013    Topiramate Other-Reaction in Comments 07/11/2013        OBJECTIVE:   BP 98/53 (SITE: left arm, Orthostatic Position: sitting, Cuff Size: regular)   Pulse 70   Temp 36.4 C (97.5 F) (Temporal)   Ht 0.53 m (1' 8.87")   Wt 73.3 kg (161 lb 9.6 oz)   LMP 05/06/1983   SpO2 98%   BMI 260.95 kg/m    Gen: NAD, appears well   HEENT: EOMI, sclera clear, anicteric   Neck: trachea midline, no thyromegaly   Chest: CTAB, no wheezing   CV: RRR, no murmurs   Extrem: no cyanosis or edema, pulses palpable   Skin: no rashes or lesions   Neuro: no focal deficits   Psych: normal mood and affect    Back: Strength 5/5 LE b/l, senstion to light touch intact, reflexes 2+ patellar, straight leg + on R side, FROM. nontender to palpation along Lumbar paraspinal muscles, + tender to palpation along R gluteal  muscles.           Hemoglobin   Date Value Ref Range Status   12/31/2021 11.8 (L) 12.0 - 16.0 g/dL Final   12/24/2021 11.2 (L) 12.0 - 16.0 g/dL Final  Hgb A1C   Date Value Ref Range Status   02/10/2022 6.0 (H) 3.9 - 5.6 % Final     Comment:     5.7 - 6.4% indicates an increased risk for diabetes.  >6.4% is a criterion for the diagnosis of diabetes.    This boronate affinity Hb A1c method provides accurate analytical results in the presence of nearly all hemoglobin variants. Hb F higher than 15% of total Hb may yield falsely low results. Conditions that shorten red cell survival, such as the presence of unstable hemoglobins like Hb SS, Hb CC and Hb SC, or other causes of hemolytic anemia may yield falsely low results. Iron deficiency anemia may yield falsely high results.   11/08/2021 6.8 (H) 3.9 - 5.6 % Final     Comment:     5.7 - 6.4% indicates an increased risk for diabetes.  >6.4% is a criterion for the diagnosis of diabetes.    This boronate affinity Hb A1c method provides accurate analytical results in the presence of nearly all hemoglobin variants. Hb F higher than 15% of total Hb may yield falsely low results. Conditions that shorten red cell survival, such as the presence of unstable hemoglobins like Hb SS, Hb CC and Hb SC, or other causes of hemolytic anemia may yield falsely low results. Iron deficiency anemia may yield falsely high results.     Thyroid Stimulating Hormone   Date Value Ref Range Status   05/07/2021 1.02 0.27 - 4.20 uIU/mL Final   10/25/2019 1.91 0.35 - 3.30 uIU/mL Final     Cholesterol   Date Value Ref Range Status   02/10/2022 143 <200 mg/dL Final   02/05/2021 158 <200 mg/dL Final     LDL Cholesterol Calculation   Date Value Ref Range Status   02/10/2022 44 <100 mg/dL Final   02/05/2021 59 <100 mg/dL Final     Creatinine Serum   Date Value Ref Range Status   02/10/2022 1.06 0.51 - 1.17 mg/dL Final   12/10/2021 1.19 (H) 0.51 - 1.17 mg/dL Final            12/10/2021    11:16 AM   PHQ-2    Interest 1   Feeling 1   PHQ-2 Total Score 2     No question data found.           If >  1: Please notify a mental health provider or the patients primary care provider with any concerns about acute risk for suicide.             /15   /12        Total Score Depression Severity   1-4 Minimal Depression   5-9 Mild Depression   10-14 Moderate Depression   15-19 Moderately Severe Depression   20-27 Severe Depression       Depression Treatment Plan:  see assessment/plan section of note    No follow-ups on file.    I did review patient's past medical and family/social history, no changes noted.  Barriers to Learning assessed: none. Patient verbalizes understanding of teaching and instructions.  Education Needs Identified?   no      Patient was counseled specifically on the following: diagnostic results, impressions, and/or recommended diagnostic studies reviewed, instructions for management and/or follow-up, risk factor reduction and patient and family education    I spent 20 minutes with the patient, >50% of which were spent counseling her regarding differential, additional testing, if indicated, potential treatment options, and lifestyle modifications,  if applicable    Parts of this note may have been created with the support of Dragon dictation.  Please accept any grammatical, sound-alike, or syntax errors as likely dictation errors.    Electronically signed by:  Vassie Moselle, MD  Associate Physician  [place]

## 2022-04-22 ENCOUNTER — Other Ambulatory Visit: Payer: Self-pay | Admitting: Student in an Organized Health Care Education/Training Program

## 2022-04-22 ENCOUNTER — Other Ambulatory Visit: Payer: Self-pay | Admitting: GASTROENTEROLOGY

## 2022-04-22 DIAGNOSIS — K921 Melena: Secondary | ICD-10-CM

## 2022-04-22 DIAGNOSIS — L304 Erythema intertrigo: Secondary | ICD-10-CM

## 2022-04-22 NOTE — Telephone Encounter (Signed)
Called pt. Spoke with [name], 3 patient identifiers used.    RE: Nurse triage phone call     Pt reports dark/black stools and states her stools have been "darker than usual." Pt states her stools have been dark since receiving iron infusions, but for the past couple weeks stool has been darker in color.   Pt also reports that she has hemorrhoids that are actively bleeding, states the bleeding as been consistent for the past few days.   Pt denies: vomiting blood, abd pain or cramps, dizziness or faintness, headaches, SOB or chest pain. She does report increased fatigue from baseline fatigue levels. Pt was A & O x4 at the time of phone call, she was visiting her daughter at a facility.     ED precautions given for new or worsening symptoms or signs of bleeding.     RN will route urgent message to MD for review/further instructions.     Thank you,   Lucina Mellow, BSN RN   [facility]

## 2022-04-22 NOTE — Telephone Encounter (Signed)
Medication Change:    Requesting a change in the prescription for NYSTATIN 100,000 unit/gram Powder .  The current dose is 100,000 unit/gram Powder and the instructions are Apply a thin layer of powder to affected area 4 times a day as needed .  The reason for changing the medication is because the pharmacy does not have in stock.  They sent a fax over requesting the quantity change. Quantity needs to be 60 grams instead of 30 grams.      The patient requests Send to preferred pharmacy.  Caller is Ysidro Evert from [employer]    PH: [place]  Patient Services Representative II  [facility]  Patient [place]

## 2022-04-22 NOTE — Telephone Encounter (Signed)
[place] Team    Request to resend prescription  pharmacy requesting #60g because they do not have #30g in stock.     Refill request has been pended to the pharmacist for review per [facility] P&T Approved Protocol 04/22/2022

## 2022-04-23 MED ORDER — NYSTATIN 100,000 UNIT/GRAM TOPICAL POWDER: TOPICAL | 1 refills | Status: DC

## 2022-04-23 NOTE — Telephone Encounter (Signed)
Pharmacy Refill Optimization (PRO)    Request to resend prescription  due to pharmacy inventory issue    Reordering with updated qty authorized per P&T Protocol 04/23/2022

## 2022-04-24 ENCOUNTER — Encounter: Payer: Self-pay | Admitting: Student in an Organized Health Care Education/Training Program

## 2022-04-24 NOTE — Telephone Encounter (Signed)
From: [name]  To: Vassie Moselle, MD  Sent: 04/23/2022 6:19 PM PST  Subject: Nystop top powder     Hello Dr. Festus Barren has told me they have reached out to you about the above med. The current Rx is not available and that is why they are trying to get the Rx changed. I hope that you are able to make this change for me.     As a side note my iron levels have become an issue again. Dr. Virgel Bouquet is my GI guy and is ordering more blood work and an EGD has been scheduled but unfortunately he is not available until February 16th. I wanted to keep you in the loop as my new PC.     Thank you for everything. Merry Christmas

## 2022-04-24 NOTE — Telephone Encounter (Signed)
This is a duplicate message.  Refer to TE dated 04/22/2022.     Will close this encounter    Acknowledge and Encounter closed.  FYI to MD.

## 2022-04-25 ENCOUNTER — Encounter: Payer: Self-pay | Admitting: GASTROENTEROLOGY

## 2022-04-25 ENCOUNTER — Ambulatory Visit: Payer: Medicare Other | Attending: GASTROENTEROLOGY

## 2022-04-25 DIAGNOSIS — K921 Melena: Secondary | ICD-10-CM | POA: Insufficient documentation

## 2022-04-25 LAB — CBC NO DIFFERENTIAL
Hematocrit: 23.2 % — ABNORMAL LOW (ref 36.0–46.0)
Hemoglobin: 7.2 g/dL — ABNORMAL LOW (ref 12.0–16.0)
MCH: 23.3 pg — ABNORMAL LOW (ref 27.0–33.0)
MCHC: 31 % — ABNORMAL LOW (ref 32.0–36.0)
MCV: 75.3 fL — ABNORMAL LOW (ref 80.0–100.0)
MPV: 8 fL (ref 6.8–10.0)
Platelet Count: 214 10*3/uL (ref 130–400)
RDW: 16.6 % — ABNORMAL HIGH (ref 0.0–14.7)
Red Blood Cell Count: 3.08 10*6/uL — ABNORMAL LOW (ref 4.00–5.20)
White Blood Cell Count: 5.6 10*3/uL (ref 4.5–11.0)

## 2022-04-25 LAB — FERRITIN: Ferritin: 7 ng/mL — ABNORMAL LOW (ref 13–150)

## 2022-04-29 NOTE — Progress Notes (Unsigned)
Gastroenterology and Hepatology Outpatient Progress Note    Patient Name: Paula Daugherty  MRN: [id number]  Date of Service: 11/13/2021   Referring Provider: [name], MD  Attending Physician: Georgeanne Nim MD, MPH    REASON FOR CONSULTATION: NASH and gastroparesis    SUBJECTIVE:  Paula Daugherty is a 76 yr old female with history of DM and obesity BMI 32.  She has fatty liver seen on [place] 10/2015 which showed fat in her liver without splenomegaly and a liver fibrosis score of 12.9 (F3) suggestive of severe fibrosis.  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  She had chronic nausea and vomiting recently diagnosed with gastroparesis on GES.  She has had symptomatic relief from Reglan.  She is also managing with zofran as needed for nausea.    EGD and Colonoscopy 04/2021:    A.        DUODENUM, 2ND PORTION, TATTOOED AREA (BIOPSY):  -   Duodenal mucosa with normal villous architecture and no increased intraepithelial lymphocytes  -   Negative for malignancy              B.        COLON, ASCENDING, POLYP X 2 ( POLYPECTOMY ):  -   Tubular adenoma (2)     C.        COLON, DESCENDING, "POLYP X 2" ( POLYPECTOMY ):  -   One fragment of tubular adenoma (see comment)              D.        RECTUM, POLYP ( POLYPECTOMY ):  -   Hyperplastic polyp     COMMENT: Part C, one fragment of tissue does not survive tissue processing.      Hgb decreased to 10 with microcytosis, some rectal bleeding from hemorrhoids.    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    Psychiatric illness         ALLERGIES:    Sulfa (Sulfonamide Antibiotics)    Rash  Contrast Dye [Radiopaque Agent]    Hives  Hydrochlorothiazide    Other-Reaction in Comments    Comment:Patient reports  Januvia [Sitagliptin]    Other-Reaction in Comments    Comment:Patient reports  Keflex [Cephalexin]    Abdominal Pain  Lyrica [Pregabalin]    Other-Reaction in Comments    Comment:Patient reports  Omnipaque [Iohexol]    Other-Reaction in Comments    Comment:Patient reports  Prozac [Fluoxetine Hcl]    Other-Reaction in Comments    Comment:Patient reports  Topiramate    Other-Reaction in Comments    Comment:Hairfall    MEDICATIONS:  Current Outpatient Medications on File Prior to Visit   Medication Sig Dispense Refill    Albuterol (PROAIR HFA, PROVENTIL HFA, VENTOLIN HFA) 90 mcg/actuation inhaler INHALE 1 TO 2 PUFFS BY MOUTH EVERY 6 HOURS AS NEEDED FOR WHEEZING 8.5 g 0  Amlodipine (NORVASC) 10 mg Tablet Take 1 tablet by mouth every day. 90 tablet 3    Atorvastatin (LIPITOR) 10 mg Tablet Take 1 tablet by mouth every day at bedtime. 90 tablet 3    CloNIDine (CATAPRES) 0.1 mg Tablet Take 1 tablet by mouth every day at bedtime. Indications: "change of life" signs 90 tablet 3    Empagliflozin (JARDIANCE) 25 mg Tablet Take 1 tablet by mouth every day. 90 tablet 3    Iron Sucrose (VENOFER) - Infusion Med Placeholder This is a placeholder order to keep the medication list accurate. See therapy plan for details.      Losartan (COZAAR) 25 mg Tablet Take 1 tablet by mouth every day. 90 tablet 3    Metformin (GLUCOPHAGE) 1,000 mg tablet Take 1 tablet by mouth 2 times daily with meals. (diabetes) 180 tablet 3    metoclopramide HCl (REGLAN PO) Take by mouth.      NYSTATIN 100,000 unit/gram Powder Apply a thin layer of powder to affected area 4 times a day as needed 60 g 1    Omeprazole (PRILOSEC) 20 mg Delayed Release Capsule GENERIC FOR PRILOSEC-- TAKE 1 CAPSULE BY MOUTH TWICE DAILY BEFORE MEALS 180 capsule 1    Ondansetron (ZOFRAN-ODT) 8 mg  disintegrating tablet Take 1 tablet by mouth every 8 hours if needed. 100 tablet 1    ONETOUCH ULTRA TEST Strips USE TO TEST BLOOD GLUCOSE TWICE DAILY. 200 strip 3     No current facility-administered medications on file prior to visit.       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  GI: vomiting, nausea, rectal bleeding.    PHYSICAL EXAMINATION:  Temp: --  Temp src: --  Pulse: --  BP: --  Resp: --  SpO2: --  Height: --  Weight: --    GENERAL: well appearing, well nourished.    LABORATORY EXAMINATIONS:   Lab Results   Lab Name Value Date/Time    NA 143 02/10/2022 09:19 AM    NA 142 08/30/2015 08:35 AM    K 4.6 02/10/2022 09:19 AM    K 4.0 08/30/2015 08:35 AM    CL 103 02/10/2022 09:19 AM    CL 104 08/30/2015 08:35 AM  CO2 26 02/10/2022 09:19 AM    CO2 22 (L) 08/30/2015 08:35 AM    BUN 20 02/10/2022 09:19 AM    BUN 19 08/30/2015 08:35 AM    CR 1.06 02/10/2022 09:19 AM    CR 0.88 08/30/2015 08:35 AM    GLU 169 (H) 02/10/2022 09:19 AM    GLU 139 (H) 08/30/2015 08:35 AM     Lab Results   Lab Name Value Date/Time    WBC 5.6 04/25/2022 08:27 AM    WBC 8.9 08/30/2015 08:35 AM    HGB 7.2 (L) 04/25/2022 08:27 AM     HGB 14.4 08/30/2015 08:35 AM    HCT 23.2 (L) 04/25/2022 08:27 AM    HCT 44.6 08/30/2015 08:35 AM    PLT 214 04/25/2022 08:27 AM    PLT 217 08/30/2015 08:35 AM     Lab Results   Lab Name Value Date/Time    AST 39 02/10/2022 09:19 AM    AST 71 (H) 08/30/2015 08:35 AM    ALT 44 (H) 02/10/2022 09:19 AM    ALT 69 (H) 08/30/2015 08:35 AM    ALP 115 02/10/2022 09:19 AM    ALP 104 08/30/2015 08:35 AM    ALB 4.3 02/10/2022 09:19 AM    ALB 4.2 08/30/2015 08:35 AM    TP 7.6 02/10/2022 09:19 AM    TP 8.5 (H) 08/30/2015 08:35 AM    TBIL 0.7 02/10/2022 09:19 AM    TBIL 0.9 08/30/2015 08:35 AM     Lab Results   Component Value Date    INR 1.08 11/08/2021     Lab Results   Component Value Date    VITD25 34.4 03/17/2016       VIRAL SEROLOGIES:  No results found. However, due to the size of the patient record, not all encounters were searched. Please check Results Review for a complete set of results.  Lab Results   Component Value Date    HBSAG Nonreactive 12/08/2013     Lab Results   Component Value Date    HEPBABQT 153.46 12/08/2013     No results found for: "ANTIHBCT"  No results found. However, due to the size of the patient record, not all encounters were searched. Please check Results Review for a complete set of results.  No results found. However, due to the size of the patient record, not all encounters were searched. Please check Results Review for a complete set of results.  No results found. However, due to the size of the patient record, not all encounters were searched. Please check Results Review for a complete set of results.    Lab Results   Component Value Date    HEPCABSCR Nonreactive 12/08/2013     No results found. However, due to the size of the patient record, not all encounters were searched. Please check Results Review for a complete set of results.  No results found. However, due to the size of the patient record, not all encounters were searched. Please check Results Review for a complete set of  results.  No results found for: "HIV1AND2SCR"    AUTOIMMUNE:  No results found for: "ANASCREEN"  No results found for: "IGG"  No results found. However, due to the size of the patient record, not all encounters were searched. Please check Results Review for a complete set of results.  No results found. However, due to the size of the patient record, not all encounters were searched. Please check Results Review for a complete  set of results.    OTHER:  Lab Results   Component Value Date    FRTN 7 (L) 04/25/2022     No results found. However, due to the size of the patient record, not all encounters were searched. Please check Results Review for a complete set of results.  No results found. However, due to the size of the patient record, not all encounters were searched. Please check Results Review for a complete set of results.    STUDIES:  Colonoscopy 2015  DIAGNOSIS                                A.   CECUM, POLYP (SNARE POLYPECTOMY):           -    Fragments of tubular adenoma (see Comment)           -    No high-grade dysplasia or malignancy            B.   RECTUM, ULCER (BIOPSY):           -    Colonic mucosa with focal erosion and reactive epithelial changes           -    No dysplasia or malignancy           -    Deeper levels examined     EGD 02/2016     A.   DUODENUM, POLYP (BIOPSY):           -    Well differentiated neuroendocrine tumor, Grade 1, transected, see                comment           -    Maximum size: 7.5 mm (measured on slide)           -    Ki67 <2%           -    Synaptophysin positive            Comment: This case has been reviewed in intradepartmental consultation.        Assessment and Plan:  1. NASH: based on Fibroscan, she has F3 disease.  Appears stable, diet and exercise advised.    2. Gastroparesis confirmed by gastric emptying study 06/2016: she had symptomatic relief using Reglan, but we cannot continue this long term because of the risk for tardive dyskinesia.  Overall she is  managing fine with zofran as needed for nausea.   3. Carcinoid: no evidence of residual disease, follow up with oncology  4. Microcytic anemia: concern for GI bleed.  Recent EGD and Colonoscopy do no reveal a source.  Will treat hemorrhoids with Anusol and order capsule endoscopy.       No orders of the defined types were placed in this encounter.    No follow-ups on file.    Georgeanne Nim MD, MPH  Division of Gastroenterology and Hepatology    No LOS data to display

## 2022-04-30 ENCOUNTER — Encounter: Payer: Self-pay | Admitting: GASTROENTEROLOGY

## 2022-04-30 ENCOUNTER — Ambulatory Visit: Payer: Medicare Other | Attending: GASTROENTEROLOGY | Admitting: GASTROENTEROLOGY

## 2022-04-30 VITALS — BP 117/66 | HR 77 | Temp 98.5°F | Ht 62.0 in | Wt 160.3 lb

## 2022-04-30 DIAGNOSIS — D500 Iron deficiency anemia secondary to blood loss (chronic): Secondary | ICD-10-CM | POA: Insufficient documentation

## 2022-04-30 DIAGNOSIS — K3184 Gastroparesis: Secondary | ICD-10-CM | POA: Insufficient documentation

## 2022-04-30 DIAGNOSIS — K7581 Nonalcoholic steatohepatitis (NASH): Secondary | ICD-10-CM | POA: Insufficient documentation

## 2022-04-30 MED ORDER — ONDANSETRON 8 MG DISINTEGRATING TABLET: 8.0000 mg | DISINTEGRATING_TABLET | Freq: Three times a day (TID) | ORAL | 1 refills | Status: DC | PRN

## 2022-04-30 NOTE — Nursing Note (Signed)
Patient is in room #7    Medication list given at the time of check in for review by patient, instructed patient to indicate to the doctor any corrections that need to be made for Medication Reconciliation.    Patient identifiers obtained. vital signs taken, screened for pain, allergies checked, patient roomed, and Georgeanne Nim, MD notified.     Su Monks, MA

## 2022-04-30 NOTE — Patient Instructions (Signed)
You will be called to schedule your infusion.

## 2022-05-01 ENCOUNTER — Telehealth: Payer: Self-pay | Admitting: GASTROENTEROLOGY

## 2022-05-01 NOTE — Telephone Encounter (Signed)
Pt called in wanting to Scheduled venofer injection. Gave Pt Aim #

## 2022-05-02 ENCOUNTER — Other Ambulatory Visit: Payer: Self-pay | Admitting: GASTROENTEROLOGY

## 2022-05-02 DIAGNOSIS — D500 Iron deficiency anemia secondary to blood loss (chronic): Secondary | ICD-10-CM

## 2022-05-06 ENCOUNTER — Other Ambulatory Visit: Payer: Self-pay | Admitting: GASTROENTEROLOGY

## 2022-05-06 ENCOUNTER — Ambulatory Visit
Admission: RE | Admit: 2022-05-06 | Discharge: 2022-05-06 | Disposition: A | Discharge status: Home / Self Care | Payer: Medicare Other | Source: Ambulatory Visit | Attending: GASTROENTEROLOGY | Admitting: GASTROENTEROLOGY

## 2022-05-06 VITALS — BP 105/68 | HR 78 | Temp 97.8°F | Resp 18

## 2022-05-06 DIAGNOSIS — D500 Iron deficiency anemia secondary to blood loss (chronic): Secondary | ICD-10-CM | POA: Insufficient documentation

## 2022-05-06 LAB — CBC NO DIFFERENTIAL
Hematocrit: 21.3 % — ABNORMAL LOW (ref 36.0–46.0)
Hemoglobin: 6.5 g/dL — ABNORMAL LOW (ref 12.0–16.0)
MCH: 22.3 pg — ABNORMAL LOW (ref 27.0–33.0)
MCHC: 30.7 % — ABNORMAL LOW (ref 32.0–36.0)
MCV: 72.7 fL — ABNORMAL LOW (ref 80.0–100.0)
MPV: 7.8 fL (ref 6.8–10.0)
Platelet Count: 193 10*3/uL (ref 130–400)
RDW: 17.4 % — ABNORMAL HIGH (ref 0.0–14.7)
Red Blood Cell Count: 2.92 10*6/uL — ABNORMAL LOW (ref 4.00–5.20)
White Blood Cell Count: 5.2 10*3/uL (ref 4.5–11.0)

## 2022-05-06 LAB — FERRITIN: Ferritin: 5 ng/mL — ABNORMAL LOW (ref 13–150)

## 2022-05-06 MED ORDER — ALTEPLASE 2 MG INTRA-CATHETER SOLUTION
2.0000 mg | Status: DC | PRN
Start: 2022-05-06 — End: 2022-05-06

## 2022-05-06 MED ORDER — DIPHENHYDRAMINE 25 MG CAPSULE
25.0000 mg | ORAL_CAPSULE | ORAL | Status: DC | PRN
Start: 2022-05-06 — End: 2022-05-06

## 2022-05-06 MED ORDER — IRON SUCROSE 100 MG IRON/5 ML INTRAVENOUS SOLUTION
200.0000 mg | Freq: Once | INTRAVENOUS | Status: AC
Start: 2022-05-06 — End: 2022-05-06
  Administered 2022-05-06: 200 mg via INTRAVENOUS
  Filled 2022-05-06: qty 10

## 2022-05-06 MED ORDER — FAMOTIDINE (PF) 20 MG/50 ML IN 0.9 % NACL (ISO) INTRAVENOUS PIGGYBACK
20.0000 mg | INJECTION | INTRAVENOUS | Status: DC | PRN
Start: 2022-05-06 — End: 2022-05-06

## 2022-05-06 MED ORDER — NACL 0.9% IV INFUSION
INTRAVENOUS | Status: DC
Start: 2022-05-06 — End: 2022-05-06

## 2022-05-06 MED ORDER — HEPARIN, PORCINE (PF) 100 UNIT/ML INTRAVENOUS SYRINGE
3.0000 mL | INJECTION | INTRAVENOUS | Status: DC | PRN
Start: 2022-05-06 — End: 2022-05-06

## 2022-05-06 MED ORDER — ACETAMINOPHEN 325 MG TABLET
650.0000 mg | ORAL_TABLET | ORAL | Status: DC | PRN
Start: 2022-05-06 — End: 2022-05-06

## 2022-05-06 MED ORDER — DIPHENHYDRAMINE 50 MG/ML INJECTION SOLUTION
25.0000 mg | INTRAMUSCULAR | Status: DC | PRN
Start: 2022-05-06 — End: 2022-05-06

## 2022-05-06 MED ORDER — ALBUTEROL SULFATE 2.5 MG/3 ML (0.083 %) SOLUTION FOR NEBULIZATION: 2.5000 mg | INHALATION_SOLUTION | RESPIRATORY_TRACT | Status: DC | PRN

## 2022-05-06 MED ORDER — EPINEPHRINE 0.3 MG/0.3 ML INJECTION, AUTO-INJECTOR
0.3000 mg | AUTO-INJECTOR | INTRAMUSCULAR | Status: DC | PRN
Start: 2022-05-06 — End: 2022-05-06

## 2022-05-06 MED ORDER — METHYLPREDNISOLONE SODIUM SUCCINATE 125 MG SOLUTION FOR INJECTION
125.0000 mg | INTRAMUSCULAR | Status: DC | PRN
Start: 2022-05-06 — End: 2022-05-06

## 2022-05-06 NOTE — Progress Notes (Signed)
Patient was NOT wearing a surgical mask  Standard precautions were followed when caring for the patient.   PPE used by provider during encounter: Surgical mask and Gloves    [facility] Progress Note:    Patient arriving to [facility] for scheduled appointment. Patient arriving by ambulation, accompanied by self. Two patient identifers used, name band placed.    Plan of Care Reviewed:     Reason for appointment: for Iron  Treatment number/Frequency: 5 of 5 total treatments  [facility] Terms and Condition signed within one year?: Yes    This RN reviewed s/s of allergic reaction with patient, all questions answered. Patient verbalized understanding.     Patient escorted to room. VS obtained. Orders reviewed, appropriate orders released.     IV access type: IV.   Labs drawn: Yes. CBC, Ferritin  Pre-medications required prior to start of infusion: Patient took own Tylenol and Benadryl 30 mins prior to appointment  Iron Sucrose (VENOFER) Injection 200 mg IV  administered per order.  Vital signs obtained at end of treatment and stable.    Patient tolerating infusion without issue. Observed for  43mns post infusion. Patient stable upon leaving [facility] after treatment completion.    Last day of treatment?: Yes - [facility] Therapy Plan completed and orders resolved.    If no, next appointment confirmed (date scheduled):       STammi Klippel RN  [facility] [place] ([phone]

## 2022-05-07 ENCOUNTER — Ambulatory Visit: Payer: Medicare Other | Attending: Family Medicine

## 2022-05-07 ENCOUNTER — Telehealth: Payer: Self-pay | Admitting: GASTROENTEROLOGY

## 2022-05-07 DIAGNOSIS — D500 Iron deficiency anemia secondary to blood loss (chronic): Secondary | ICD-10-CM

## 2022-05-07 LAB — BLOOD TYPE VERIFICATION: Patient Blood Type: O POS

## 2022-05-07 LAB — TYPE AND SCREEN
Ab Scrn-Gel: NEGATIVE
Patient Blood Type: O POS

## 2022-05-07 NOTE — Addendum Note (Signed)
Addended by: Virgel Bouquet, Shallyn Constancio on: 05/07/2022 12:36 PM     Modules accepted: Orders

## 2022-05-07 NOTE — Telephone Encounter (Signed)
3 PT identifiers obtained.    PT called to follow-up with Dr. Mallie Snooks nurse about the blood transfusion, I let PT know I would forward a message to the RN for a callback. PT says she will be going out of town in 2 weeks and wanted to get this taken care of before she leaves.    THhnks,      Mali Mordche Hedglin  Patient Services Representative 3  [facility]  [address], [place]  [place], [place] [address]  [phone]

## 2022-05-07 NOTE — Addendum Note (Signed)
Addended by: Lucina Mellow on: 05/07/2022 12:35 PM     Modules accepted: Orders

## 2022-05-07 NOTE — Telephone Encounter (Signed)
Requested orders for blood transfusion + labs placed.     Called pt. Spoke with [name], 3 patient identifiers used.    Informed pt of new lab orders + blood transfusion orders. Pt advised to contact adult infusion at [phone] to schedule appt for blood transfusion, after labs completed.   Repeat CBC and ferritin level in 1 week post blood transfusion.   Relayed message from Dr. Virgel Bouquet:  Hold off on scheduling further ferritin infusions until new labs are repeated post blood transfusion. MD will review labs and advise further.     Pt verbalizes understanding of instructions given and is agreeable to plan of care. She plans to have labs (type and screen + blood verification) done today and will contact Adult infusion to schedule the blood transfusion.     Pt was also advised to contact GI/Hep clinic if she further questions/concerns.     Thank you,   Lucina Mellow, BSN RN   [facility]

## 2022-05-07 NOTE — Addendum Note (Signed)
Addended by: Lucina Mellow on: 05/07/2022 12:55 PM     Modules accepted: Orders

## 2022-05-07 NOTE — Telephone Encounter (Signed)
Pt called wanting to scheduled Blood transfusion. I informed Pt that her doctor or care team need to send request for Blood transfusion. Pt stated that she will reach out to her care team.

## 2022-05-07 NOTE — Telephone Encounter (Signed)
Please see TE for 05/07/2021 "Infusion Clinic"     Closing this encounter.     Thanks,   Lucina Mellow, BSN RN   [facility]

## 2022-05-07 NOTE — Progress Notes (Unsigned)
PHYSICAL THERAPY EVALUATION 05/08/2022     Diagnosis:  Therapy diagnosis: low back pain    Medical diagnosis: M54.50,G89.29 (ICD-10-CM) - Chronic bilateral low back pain, unspecified whether sciatica present     Authorizing MD (First and last name) and PI#: ([id number]) Nino Glow, MD   Onset Date: Dec 2023     Start of Care Date: 05/08/2022   Prior Level of function: independent  Prior treatment for this diagnosis: no  Rehab potential: Fair    Precautions: Easily fatigued due to anemia, required transfusions    Certification period end date (for Medicare patients): 08/05/2022  Estimated Discharge Date: 08/05/22    Plan of care:  Established 05/08/2022  1 times a week for a total of 6-12 treatments. Patient has 12 visits currently authorized. Authorization/Referral # [id number] . Expires 04/17/23. Additional authorization may be necessary.    To include   Therapeutic Procedure  Neuromuscular Re-education  Therapeutic Activity  Traction  Deep Soft Tissue Massage  Modalities: hot packs and cold packs  Home Exercise Program  Patient/Caregiver Education    Goals:   Patient will be able to sit 45-60 minutes without increased symptoms  Patient will be able to be on feet 15-20 minutes without increased symptoms  Patient will be able to walk 20 minutes without increased symptoms  Patient will be independent with home exercise program and home recommendations    Patient will be able to     Fall risks Assessment:   No falls in past year. No throw rugs, poorly lighted areas, missing rails/grab bars in the home. No medications or conditions that affect stability or mobility.         Total Evaluation time: 30   Treatment time in addition to evaluation: 15         Clinical Evaluation     SUBJECTIVE  Office Visit  04/17/2022  [facility]   Progress Notes  Nino Glow, MD (*PHYSICIAN: FACULTY)  FAMILY PRACTICE   History of current problem:                                   Paula Daugherty is  a 77yrold female referred to physical therapy due to acute bilateral LBP.    Back pain:   New onset about 1 week, a little over   If she sits too long, really bad   If she stays up and tries to move it gets better   She had not had pain like this before   Started across whole back, better except on R side   Maybe a little numbness in her R leg mostly w/ sitting   Sometimes does have shooting pain down R leg   No change in activity or heavy lifting   Working around house a lot lately but typical activities   Normal low energy to do much per her report   Does endorse hx of depression - doesn't take anything for it     Assessment: acute, new. ddx includes DDD, lumbar radiculopathy, piriformis syndrome, SI joint dysfunction, sciatica etc.  Exam most consistent w sciatica. Discussed management options including pain control, physical therapy, home exercises, imaging.    Plan:   - Start physical therapy   - home exercises provided   - consider xrays at follow up if ongoing   - counseled on pain control with tylenol, ice/heat therapy, topical Voltaren and lidocaine  patches   - avoid nsaids due to hx of kidney disease   - follow up 2-3 months, if no improvement consider referral to spine program and MRI's for evaluation of possible injections or nerve ablation   - consider referral to integrative medicine for ongoing pain control   - Physical Therapy Referral    No images are attached to the encounter.     PMHX-  Pain onset gradual onset in Dec 2023. Across lowback and then to R hip and down R leg. Better with less pain with patches, leg exercise(s), NSAID, ice and heat. She has blisters in her lowback and hips from hot patches and fatigued due to low Fe and will have transfusion tomorrow and next week pending blood work. Leaving on cruise for jan 17 until feb 2.  Appeared to burn her back possibly from heat patches. Has band aide on it now.     History of chest pain and drs think from anemia.    C/c-ache across lowback  and stiffness, intermittent R hip and to R lateral thigh.     Aggravating factors include: right sidelying, sleep ok, sitting 30 minutes, transfers, forward bending, standing 10 minutes   Easing Factors include: reclining, walking 15 -20 minutes    Pre pain: 3/10 LB  Post pain:  3/10    Pain ranges from   3/10 at best   5/10 at worst     Limitations in activities of daily living include:   Sitting   Standing     Medical History/Co-morbidities effecting the plan of care: Anemia with transfusions  Electronic Medical Record Reviewed: Including radiologist reports of associated images and scans      Past Medical History:  09/23/2017: Angina pectoris ([place])  No date: Anxiety  No date: Asthma  No date: Cirrhosis ([place])  No date: Constipation, chronic  No date: Depression  No date: Diabetes mellitus ([place])  No date: Fatty liver  No date: Hypertension  2015: Kidney disease  No date: Liver fibrosis  05/20/2016: Neoplasm of uncertain behavior of skin  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[place])  No date: Psychiatric illness    Past Surgical History:   Procedure Laterality Date    BIOFEEDBACK PERI/URO/RECTAL  10/21/2017    session #1: N Score = 5.4    BIOFEEDBACK PERI/URO/RECTAL  11/04/2017    session #2    BIOFEEDBACK PERI/URO/RECTAL  11/18/2017    Session #3    BIOFEEDBACK PERI/URO/RECTAL  12/02/2017    Session #4    BIOFEEDBACK PERI/URO/RECTAL  12/30/2017    Session #5    BIOFEEDBACK PERI/URO/RECTAL  03/23/2018    Session #6    CAPSULE ENDOSCOPY  01/27/2018    CAPSULE ENDOSCOPY  01/29/2018         CAPSULE ENDOSCOPY  12/23/2021    wnl    COLONOSCOPY  01/04/2014    polyp--TA and erosion; hemorrhoids; tics; repeat x 3 yrs    COLONOSCOPY  12/16/2017    TA; repeat in 3 yrs    COLONOSCOPY  04/05/2021    polyps--path pending; hemorrhoids    EGD  04/2020    gastritis    EGD  04/08/2019    biopsy path--pending    EGD  04/05/2021    biopsies at tattoo--path pending    [facility] BIOFEEDBACK PERI/URO/RECTAL  05/02/2018          HC COLONOSCOPY,REMV LESN,SNARE  12/16/2017    MAMMOPLASTY, REDUCTION  1985    MANOMETRY  09/02/2017    work  on constipation; kegels and biofeedback; proceed w/colonoscopy     PHACOEMULSIFICATION, CATARACT Right 02/12/2015    with IOL    PR ESOPHAGOGASTRODUODENOSCOPY TRANSORAL DIAGNOSTIC  02/12/2016    EGD--polyp--path pending; antral gastritis; no varices     PR ESOPHAGOGASTRODUODENOSCOPY TRANSORAL DIAGNOSTIC  06/11/2016    EGD--biopsy at site of carcinoid tumor path pending     PR ESOPHAGOGASTRODUODENOSCOPY TRANSORAL DIAGNOSTIC      EGD--folds in duodenum--path pending     PR ESOPHAGOGASTRODUODENOSCOPY TRANSORAL DIAGNOSTIC  04/2017    EGD; repeat EGD in 1 year     PR ESOPHAGOGASTRODUODENOSCOPY TRANSORAL DIAGNOSTIC  04/20/2018    gastritis; nodule--path pending    PR KNEE SCOPE,DIAGNOSTIC  1980s    Knee arthroscopy    PR LIGATE FALLOPIAN TUBE      Tubal ligation    PR REPAIR TYMPANIC MEMBRANE      Tympanoplasty    PR TOTAL ABDOM HYSTERECTOMY  1980s    partial; no BSO    REPAIR, BLEPHAROPTOSIS Bilateral 08/06/2015    Blepharoplasty bilateral upper lids           Social History/Personal and/or environmental factors effecting the plan of care:  Lives  and single story home  Social History     Social History Narrative    Not on file       OBJECTIVE EXAM:  Appearance:   healthy, alert, and cooperative    Inspection/Palpation:   kyphosis noted   paraspinal tenderness noted R lowback paraspinals, R glute   Muscle tone: muscle tone normal without spasm    Gait:    Assistive device used: no assistive device  Gait assessment (description of gait pattern): Smooth progression through stance and swing. Normal heel-to-toe. Normal trunk sway     Range of motion:     Lumbar Spine     Normal  Other measurement techniques    Flexion Mid shin 110      inches above ankle (with longest finger)   Extension  wfl 30         Left Right Normal  Other measurement techniques    Side Bending  wfl wfl 30  inches above knee joint line (with  longest finger)   Rotation  wfl wfl 20                             Hips and lower extremities general flexibility: fair    Other range of motion tested: No other range of motion done at this time    Strength: MMT out of 5   Left  Right    Hip Flexion  (L2) 3 3   Knee Extension (L3) 5 5   Ankle Dorsiflexion (L4) 5 5   Long Toe Extensors (L5) 5 5   Ankle Plantar Flexors (S1) 5 5          General Core strength: fair    Other Strength tested: No other strength testing at this time                Segmental Testing: (note abnormality)   Lumbar spine: Not tested at this time    Lower thoracic spine:Not tested at this time    Neurological screens:    Not tested at this time      Special Tests:   Left  Right  Reason for test Position of test   Lumbar facet Grind  Not tested Not tested Lumbar facet dysfunction on ipsilateral side  with pain at maximal rotation/extension Extend to 30 degrees. Rotate with downward pressure on shoulders   Straight Leg Raise Not tested Not tested Provocation test for sciatic nerve irritation (positive at 30-60 degrees)   Supine raise one leg up keeping it straight from 0-70 degrees.  (can be sensitized)      Yeoman's Test: Not tested Not tested Provocation test for SI joint dysfunction.    Prone: stabilize contralateral hip and pull upward on anterior thigh   Patrick's Test  (FABER) Not tested Not tested Provocation test for ipsilateral hip dysfunction, or contralateral SI joint dysfunction   Supine: Stabilize contralateral pelvis.  Flex, abduct and ER hip. (figure 4 position)     Femoral Nerve Stretch Not tested Not tested Femoral Nerve Irritation/ Lumbar radiculopathy Prone with knee flexed: Elevate (extend) hip with knee still in flexion.  (modified in sitting if needed)     Other Special Tests performed: -Slump test bilateral lower extremity(ies) , no change with traction activities    Treatment today in addition to evaluation(if applicable):    Therapeutic exercise(s) home exercise program x  15 minutes added  Access Code: [email or web address]  Exercises  - Supine Lower Trunk Rotation  - 1 x daily - 7 x weekly - 1 sets - 10 reps - 3-5 sec hold  - Supine Single Knee to Chest Stretch  - 1 x daily - 7 x weekly - 2 reps - 20 sec hold  - Supine Piriformis Stretch with Foot on Ground  - 1 x daily - 7 x weekly - 2 reps - 20 sec hold  - Supine Hip Adduction Isometric with Ball  - 1 x daily - 7 x weekly - 1 sets - 10 reps - 3-5 sec hold    Patient was educated in the anatomy and physiology involved.  Energy conservation, common symptoms, treatments, responses to treatment, and self management were discussed with the patient.       ASSESSMENT: The patient presents with pain and decreased function due to low back dysfunction.   Signs and symptoms are consistent with low back pain. Patient is currently suffering from anemia and will have RBC transfusion and Fe infusions. Her progress may be affected by her excessive fatigue and low energy levels. We will progress her treatment as her condition allows.     Patient made informed consent for today's treatment. The procedures, treatment, and plan of care were discussed, their questions were answered, and the patient contributed and agreed with the process.  The patient would benefit from skilled physical therapy to improve function with: sitting, forward bending, walking    Clinical Presentation: Evolving with changing characteristics    The components of this evaluation necessitated a Moderate complexity level of clinical decision making.      Plan for next visit: assess energy conservation, home exercise program appropriateness, low level stab ex    (see plan of care at top of note for general plan details)    In the event of unattended discharge, this note will serve as discharge document        Patient Education:      Method of Teaching: demonstration, verbal  and written hand-out     Learner: patient      Response: verbalizes understanding and able to give return demonstration       Has the plan of care been explained to the patient/caregiver? yes       Is the patient able to understand the plan of care: yes  Does the patient/caregiver(s) agree with the plan of care: yes       Are there barriers to learning? no.         Motivated to learn? yes       Best learning method: verbal, or demo      Patient/Family participation and agreement with recommendations: verbalized agreement    Is patient currently receiving outside care for this problem:     Home Health care services: no  Skilled nursing facility: no    Other no    Nigel Sloop, [place]  PI# [id number]

## 2022-05-08 ENCOUNTER — Ambulatory Visit: Payer: Medicare Other | Admitting: PHYSICAL THERAPIST

## 2022-05-08 ENCOUNTER — Other Ambulatory Visit: Payer: Self-pay | Admitting: GASTROENTEROLOGY

## 2022-05-08 DIAGNOSIS — M5441 Lumbago with sciatica, right side: Secondary | ICD-10-CM

## 2022-05-08 DIAGNOSIS — G8929 Other chronic pain: Secondary | ICD-10-CM

## 2022-05-08 DIAGNOSIS — M545 Low back pain, unspecified: Secondary | ICD-10-CM

## 2022-05-08 LAB — RED BLOOD CELLS ADULT
Unit Blood Type Code: 5100
Unit Expiration: 202402012359

## 2022-05-08 NOTE — Progress Notes (Signed)
New [facility]IM infusion Treatment:     Todays Date: 05/08/2022  Time: 11:12 AM      Notified by [facility] Comment Staff, that referring party, Dr. Virgel Bouquet, is requesting via work queue for patient to be seen at [facility] via clinic.        Appointment Treatment Type: Infusion  Treatment for Diagnosis of : Iron deficiency anemia due to chronic blood loss     After reviewing patients chart, it was noted that:   1) AIM referral present  2) Therapy plan complete      This RN has communicated to Joslyn that patient should  be scheduled for an appointment.   Okay to schedule at [facility]? yes    Mabeline Caras, AIM RN  [place]  [employer] [address]  [phone]

## 2022-05-09 ENCOUNTER — Telehealth (HOSPITAL_BASED_OUTPATIENT_CLINIC_OR_DEPARTMENT_OTHER): Payer: Medicare Other | Admitting: Nurse Practitioner

## 2022-05-09 ENCOUNTER — Ambulatory Visit
Admission: RE | Admit: 2022-05-09 | Discharge: 2022-05-09 | Disposition: A | Discharge status: Home / Self Care | Payer: Medicare Other | Source: Ambulatory Visit | Attending: GASTROENTEROLOGY | Admitting: GASTROENTEROLOGY

## 2022-05-09 VITALS — BP 105/54 | HR 71 | Temp 96.9°F | Resp 16 | Ht 62.0 in | Wt 159.6 lb

## 2022-05-09 DIAGNOSIS — D500 Iron deficiency anemia secondary to blood loss (chronic): Secondary | ICD-10-CM | POA: Insufficient documentation

## 2022-05-09 DIAGNOSIS — D649 Anemia, unspecified: Secondary | ICD-10-CM | POA: Insufficient documentation

## 2022-05-09 LAB — RED BLOOD CELLS ADULT
Unit Blood Type: O POS
Unit Volume: 300 ML

## 2022-05-09 MED ORDER — FAMOTIDINE (PF) 20 MG/50 ML IN 0.9 % NACL (ISO) INTRAVENOUS PIGGYBACK
20.0000 mg | INJECTION | INTRAVENOUS | Status: DC | PRN
Start: 2022-05-09 — End: 2022-05-09

## 2022-05-09 MED ORDER — NACL 0.9% IV INFUSION
INTRAVENOUS | Status: DC
Start: 2022-05-09 — End: 2022-05-09

## 2022-05-09 MED ORDER — ALTEPLASE 2 MG INTRA-CATHETER SOLUTION
2.0000 mg | Status: DC | PRN
Start: 2022-05-09 — End: 2022-05-09

## 2022-05-09 MED ORDER — METHYLPREDNISOLONE SODIUM SUCCINATE 125 MG SOLUTION FOR INJECTION
125.0000 mg | INTRAMUSCULAR | Status: DC | PRN
Start: 2022-05-09 — End: 2022-05-09

## 2022-05-09 MED ORDER — HEPARIN, PORCINE (PF) 100 UNIT/ML INTRAVENOUS SYRINGE
3.0000 mL | INJECTION | INTRAVENOUS | Status: DC | PRN
Start: 2022-05-09 — End: 2022-05-09

## 2022-05-09 MED ORDER — ACETAMINOPHEN 325 MG TABLET
650.0000 mg | ORAL_TABLET | Freq: Once | ORAL | Status: AC
Start: 2022-05-09 — End: 2022-05-09
  Administered 2022-05-09: 650 mg via ORAL
  Filled 2022-05-09: qty 2

## 2022-05-09 MED ORDER — DIPHENHYDRAMINE 25 MG CAPSULE
25.0000 mg | ORAL_CAPSULE | Freq: Once | ORAL | Status: AC
Start: 2022-05-09 — End: 2022-05-09
  Administered 2022-05-09: 25 mg via ORAL
  Filled 2022-05-09: qty 1

## 2022-05-09 MED ORDER — THERAPY PLAN REFERRAL ORDER
Status: DC
Start: 2022-05-09 — End: 2022-05-09

## 2022-05-09 MED ORDER — ALBUTEROL SULFATE 2.5 MG/3 ML (0.083 %) SOLUTION FOR NEBULIZATION: 2.5000 mg | INHALATION_SOLUTION | RESPIRATORY_TRACT | Status: DC | PRN

## 2022-05-09 MED ORDER — DIPHENHYDRAMINE 50 MG/ML INJECTION SOLUTION
25.0000 mg | INTRAMUSCULAR | Status: DC | PRN
Start: 2022-05-09 — End: 2022-05-09

## 2022-05-09 MED ORDER — NACL 0.9% IV INFUSION
INTRAVENOUS | Status: DC | PRN
Start: 2022-05-09 — End: 2022-05-09

## 2022-05-09 MED ORDER — EPINEPHRINE 0.3 MG/0.3 ML INJECTION, AUTO-INJECTOR
0.3000 mg | AUTO-INJECTOR | INTRAMUSCULAR | Status: DC | PRN
Start: 2022-05-09 — End: 2022-05-09

## 2022-05-09 NOTE — Addendum Note (Signed)
Encounter addended by: Herbert Spires, RN on: 05/09/2022 10:57 AM   Actions taken: Order list changed, Diagnosis association updated, Therapy plan modified

## 2022-05-09 NOTE — Addendum Note (Signed)
Encounter addended by: Laverda Page, RN on: 05/09/2022 6:43 PM   Actions taken: Flowsheet accepted

## 2022-05-09 NOTE — Progress Notes (Signed)
Patient identified by name and DOB, screened for pain, vitals taken and documented in patient chart.  Patient was ambulatory.  Patient reported no fever, cough, or rash.     Standard precautions were followed when caring for the patient.    Jewelia Bocchino, MA

## 2022-05-09 NOTE — Addendum Note (Signed)
Encounter addended by: Laverda Page, RN on: 05/09/2022 11:46 AM   Actions taken: Flowsheet accepted

## 2022-05-09 NOTE — Progress Notes (Signed)
05/09/22  In Person Visit    Paula Daugherty is a 76yrfemale here for blood product transfusion.  Blood product transfusion consent was discussed with NEvlyn Courier  We reviewed the risks and benefits of blood product transfusion, including but not limited to transfusion reaction, adverse events, and infectious transmission.  After her questions were addressed, she consented to receive the transfusion and signed the consent form.    Total time I spent in care of this patient today (excluding time spent on other billable services) was 10 minutes.     LShelly Coss NP   Nurse Practitioner II  [facility]  [facility] Supervising MD: Dr. WVanessa Durham Primary Oncologist:  Dr. CVirgel Bouquet

## 2022-05-09 NOTE — Patient Instructions (Signed)
You have received 1 unit Packed Red Blood Cells infusion today in the Adult [facility].    A reaction can occur during a transfusion, up to a day following the transfusion, or even up to several months after the transfusion. Your nurse will watch you closely for a reaction. If a reaction occurs, the transfusion will be stopped.    Please contact your doctor or health care provider immediately if you have any of the following symptoms:  Bleeding, pain, or new bruising at the IV site  Severe back pain  Fever, chills  Nausea, vomiting  Rash, hives, itching  Headache, dizziness  Cold, clammy skin  Yellowing of the skin or eyes  Chest pain  Fast heartbeat  Trouble breathing, wheezing  Dark or reddish urine    When to contact your doctor or health care provider:    If any of the listed symptoms above develop after a blood transfusion, you may be having a transfusion reaction.     If any of the above symptoms develop on the day of the transfusion or shortly thereafter, contact your doctor immediately. If you are unable to reach your doctor, call 911 or go to the nearest Emergency Room.       For any concerns and/or questions, please call Dr. [name] MD at [phone].  If after hours or on weekends or holidays, dial [phone], ask for ON-CALL physican.

## 2022-05-09 NOTE — Addendum Note (Signed)
Encounter addended by: Danne Baxter, RN on: 05/09/2022 1:51 PM   Actions taken: MAR administration accepted

## 2022-05-09 NOTE — Addendum Note (Signed)
Encounter addended by: Loney Loh, MA on: 05/09/2022 1:55 PM   Actions taken: Flowsheet accepted

## 2022-05-09 NOTE — Addendum Note (Signed)
Encounter addended by: Laverda Page, RN on: 05/09/2022 11:43 AM   Actions taken: Child order released for a procedure order, Flowsheet accepted, MAR administration accepted

## 2022-05-09 NOTE — Addendum Note (Signed)
Encounter addended by: Laverda Page, RN on: 05/09/2022 12:13 PM   Actions taken: Flowsheet accepted

## 2022-05-09 NOTE — Addendum Note (Signed)
Encounter addended by: Laverda Page, RN on: 05/09/2022 11:05 AM   Actions taken: MAR administration accepted

## 2022-05-09 NOTE — Addendum Note (Signed)
Encounter addended by: Danne Baxter, RN on: 05/09/2022 1:41 PM   Actions taken: Pend clinical note, Clinical Note Signed, Flowsheet accepted

## 2022-05-09 NOTE — Progress Notes (Signed)
Patient assisted back into tx chair, ambulatory, A/O. Identified by full name, birth date. Initial assessment done, VSS.      Treatment plan and orders reviewed: 1 unit PRBC. 01/29/21 BD reflects Hgb 6.5. Consent done by Berniece Salines. NP.     Reviewed s/sx of blood transfusion reactions. All questions answered. Pt verbalized understanding.     Premeds given- see EMR: Tylenol 650 mg PO, Benadryl 25 mg PO.     PIV inserted, with brisk blood return and flushes easily. Pt set to IV pump and maintenance NS fluids started.     One unit of PRBC started at 120 mL/hr. Continued to monitor the patient.     VSS, afebrile. No s/sx of transfusion reaction noted. Titrated transfusion to 240 mL/hr. Continued to monitor pt.    VSS, afebrile. No s/sx of transfusion reaction noted. Transfusion continued at current rate.     PRBC completed. VSS. No s/sx of transfusion reaction noted. NS flush infusing.     30 minute post infusion observation completed. Pt continues to report feeling well with VSS.     PIV flushed with 10 cc NS, with good blood return, piv removed and gauze and paper tape.    Discharge teaching provided, including s/sx of delayed reactions and contact numbers to call for assistance. Pt verbalized understanding. DC'd to home in NAD.     Patient WAS wearing a surgical mask  Contact precautions were followed when caring for the patient  PPE used by provider during encounter: surgical mask and face shield.

## 2022-05-09 NOTE — Addendum Note (Signed)
Encounter addended by: Danne Baxter, RN on: 05/09/2022 1:21 PM   Actions taken: Flowsheet accepted

## 2022-05-09 NOTE — Addendum Note (Signed)
Encounter addended by: Laverda Page, RN on: 05/09/2022 5:44 PM   Actions taken: Chief Complaint modified, Allergies reviewed, Clinical Note Signed, MAR administration accepted, LDA properties accepted, Flowsheet accepted

## 2022-05-10 NOTE — Progress Notes (Signed)
1750:  Phone call to patient, 3 ID verifications utilized.  Patient informed that MD Chak placed another referral for IV Venofer x 5 doses.  Per patient,  MD Virgel Bouquet would like to hold off on giving her any iron infusions for now, since she received PRBC 1 unit yesterday at the infusion center.  Per patient,  she was instructed by MD Chak's office to get her blood work done on 05/16/22-Ferritin and blood count- and will proceed with either more iron infusions or blood transfusions after the labs are resulted.  Message sent to MD Chak via secure chat to confirm the information that patient provided.  For now, [facility] will defer scheduling the patient until her iron level and cbc are drawn and resulted on 05/16/22.      Denorris Reust A. Sundy Houchins,RN  [facility]  [phone]

## 2022-05-15 NOTE — Progress Notes (Deleted)
Physical Therapy Treatment Note: 05/16/2022     Diagnosis:  Therapy diagnosis: low back pain     Medical diagnosis: M54.50,G89.29 (ICD-10-CM) - Chronic bilateral low back pain, unspecified whether sciatica present      Authorizing MD (First and last name) and PI#: [id number]) Nino Glow, MD   Onset Date: Dec 2023     Start of Care Date: 05/08/2022   Prior Level of function: independent  Prior treatment for this diagnosis: no  Rehab potential: Fair     Precautions: Easily fatigued due to anemia, required transfusions     Certification period end date (for Medicare patients): 08/05/2022  Estimated Discharge Date: 08/05/22     Plan of care:  Established 05/08/2022  1 times a week for a total of 6-12 treatments. Patient has 12 visits currently authorized. Authorization/Referral # [id number] . Expires 04/17/23. Additional authorization may be necessary.    To include   Therapeutic Procedure  Neuromuscular Re-education  Therapeutic Activity  Traction  Deep Soft Tissue Massage  Modalities: hot packs and cold packs  Home Exercise Program  Patient/Caregiver Education     Goals:   Patient will be able to sit 45-60 minutes without increased symptoms  Patient will be able to be on feet 15-20 minutes without increased symptoms  Patient will be able to walk 20 minutes without increased symptoms  Patient will be independent with home exercise program and home recommendations       Fall risks Assessment:   No falls in past year. No throw rugs, poorly lighted areas, missing rails/grab bars in the home. No medications or conditions that affect stability or mobility.           Total # of visits: 2/12    Total Treatment Time: ***    Pre pain: ***/10  Post pain:       ***/10    Subjective: ***    C/c-ache across lowback and stiffness, intermittent R hip and to R lateral thigh.      Aggravating factors include: right sidelying, sleep ok, sitting 30 minutes, transfers, forward bending, standing 10 minutes   Easing Factors include:  reclining, walking 15 -20 minutes     Objective:      assess energy conservation, home exercise program appropriateness, low level stab ex     Patient seen for the following treatment:     Therapeutic exercise(s) home exercise program x 15 minutes added  Access Code: [email or web address]  Exercises  - Supine Lower Trunk Rotation  - 1 x daily - 7 x weekly - 1 sets - 10 reps - 3-5 sec hold  - Supine Single Knee to Chest Stretch  - 1 x daily - 7 x weekly - 2 reps - 20 sec hold  - Supine Piriformis Stretch with Foot on Ground  - 1 x daily - 7 x weekly - 2 reps - 20 sec hold  - Supine Hip Adduction Isometric with Ball  - 1 x daily - 7 x weekly - 1 sets - 10 reps - 3-5 sec hold    Assessment: ***    Plan: continue physical therapy - as per plan of care. ***    Any changes to plan of care, or goals:  YES/NO:63::"no"   If so then plan of care will be sent to MD for approval.     In the event of unattended discharge, this note will serve as discharge document    Nigel Sloop, PT

## 2022-05-16 ENCOUNTER — Ambulatory Visit: Payer: Medicare Other | Attending: GASTROENTEROLOGY

## 2022-05-16 ENCOUNTER — Encounter: Payer: Self-pay | Admitting: GASTROENTEROLOGY

## 2022-05-16 ENCOUNTER — Ambulatory Visit: Payer: Medicare Other | Admitting: PHYSICAL THERAPIST

## 2022-05-16 DIAGNOSIS — D500 Iron deficiency anemia secondary to blood loss (chronic): Secondary | ICD-10-CM | POA: Insufficient documentation

## 2022-05-16 LAB — FERRITIN: Ferritin: 34 ng/mL (ref 13–150)

## 2022-05-16 LAB — CBC NO DIFFERENTIAL
Hematocrit: 25.6 % — ABNORMAL LOW (ref 36.0–46.0)
Hemoglobin: 8 g/dL — ABNORMAL LOW (ref 12.0–16.0)
MCH: 23.8 pg — ABNORMAL LOW (ref 27.0–33.0)
MCHC: 31.2 % — ABNORMAL LOW (ref 32.0–36.0)
MCV: 76.2 fL — ABNORMAL LOW (ref 80.0–100.0)
MPV: 8.1 fL (ref 6.8–10.0)
Platelet Count: 188 10*3/uL (ref 130–400)
RDW: 21.4 % — ABNORMAL HIGH (ref 0.0–14.7)
Red Blood Cell Count: 3.37 10*6/uL — ABNORMAL LOW (ref 4.00–5.20)
White Blood Cell Count: 4.8 10*3/uL (ref 4.5–11.0)

## 2022-05-21 ENCOUNTER — Telehealth: Payer: Self-pay

## 2022-05-21 NOTE — Telephone Encounter (Signed)
Called to patient to schedule for one dose of  Ferritin , Patient stated that she is traveling and not able to schedule appointment , Patient will Mychart to the referring doctor to notify doctor as well . Patient will call to schedule

## 2022-05-22 NOTE — Progress Notes (Signed)
New [facility] infusion Treatment:     Todays Date: 05/22/22  Time: 1545H      Notified by [facility] Comment Staff, that referring party, GI Dr Cassell Smiles, is requesting  for patient to be seen at [facility] via clinic.        Appointment Treatment Type:  Venofer Infusion x1  Treatment for Diagnosis of : Anemia    After reviewing patients chart, it was noted that:   1) [facility] referral present  2) Therapy plan complete  3) Specialty labs required:  pls inform  pt to have Labs done 2 weeks after 1st dose of Venofer  to check levels per secure chat msg with Dr Virgel Bouquet      This RN has communicated to [facility] that patient should  be scheduled for an appointment.   Okay to schedule at [facility]? If Mobility permits    Yeslin Delio, AIM RN  [place]  [employer] [address]  [phone]

## 2022-05-23 ENCOUNTER — Telehealth: Payer: Self-pay

## 2022-05-23 MED FILL — sodium chloride 0.9 % injection solution: INTRAMUSCULAR | Qty: 20 | Status: AC

## 2022-05-23 NOTE — Telephone Encounter (Signed)
I have attempted to contact this patient by phone with the following results: I called patient to schedule the Venofer Infusion x1. The patient stated that she is currnetly on a trip and will not be home until the first of next month. She also stated that she has spoken with Dr. Virgel Bouquet and informed him that she will get blood work and infusion once she is back. I did inform her of the current plan to schedule one dose and blood work. The patient stated that she will contact [facility] once she is ready to schedule. I provided [facility] phone number.     Thank you,  [facility]   [phone]

## 2022-05-27 ENCOUNTER — Telehealth: Payer: Self-pay | Admitting: Student in an Organized Health Care Education/Training Program

## 2022-05-27 NOTE — Telephone Encounter (Signed)
Attempted to reach patient however no answer. Left voicemail to have patient return call regarding appointment with Dr. Joya Gaskins on 06/26/22 that has been rescheduled to 06/24/22 due to PCP not available.    If patient returns call, please advise appointment has changed to 06/24/22 @ 11am. If this time and date does not work, please assist with rescheduling.      [place]

## 2022-06-09 ENCOUNTER — Ambulatory Visit (INDEPENDENT_AMBULATORY_CARE_PROVIDER_SITE_OTHER): Payer: Medicare Other

## 2022-06-09 ENCOUNTER — Ambulatory Visit: Payer: Medicare Other | Attending: "Endocrinology | Admitting: PHYSICAL THERAPIST

## 2022-06-09 DIAGNOSIS — Z794 Long term (current) use of insulin: Secondary | ICD-10-CM | POA: Insufficient documentation

## 2022-06-09 DIAGNOSIS — N1831 Chronic kidney disease, stage 3a: Secondary | ICD-10-CM | POA: Insufficient documentation

## 2022-06-09 DIAGNOSIS — G8929 Other chronic pain: Secondary | ICD-10-CM | POA: Insufficient documentation

## 2022-06-09 DIAGNOSIS — E1122 Type 2 diabetes mellitus with diabetic chronic kidney disease: Secondary | ICD-10-CM

## 2022-06-09 DIAGNOSIS — M545 Low back pain, unspecified: Secondary | ICD-10-CM | POA: Insufficient documentation

## 2022-06-09 DIAGNOSIS — M5441 Lumbago with sciatica, right side: Secondary | ICD-10-CM

## 2022-06-09 LAB — HEMOGLOBIN A1C
Hgb A1C,Glucose Est Avg: 120 mg/dL
Hgb A1C: 5.8 % — ABNORMAL HIGH (ref 3.9–5.6)

## 2022-06-09 NOTE — Progress Notes (Signed)
Physical Therapy Treatment Note: 06/09/2022       Diagnosis:  Therapy diagnosis: low back pain     Medical diagnosis: M54.50,G89.29 (ICD-10-CM) - Chronic bilateral low back pain, unspecified whether sciatica present      Authorizing MD (First and last name) and PI#: ([id number]) Nino Glow, MD   Onset Date: Dec 2023     Start of Care Date: 05/08/2022   Prior Level of function: independent  Prior treatment for this diagnosis: no  Rehab potential: Fair     Precautions: Easily fatigued due to anemia, required blood and Fe transfusions     Certification period end date (for Medicare patients): 08/05/2022  Estimated Discharge Date: 08/05/22     Plan of care:  Established 05/08/2022  1 times a week for a total of 6-12 treatments. Patient has 12 visits currently authorized. Authorization/Referral # [id number] . Expires 04/17/23. Additional authorization may be necessary.    To include   Therapeutic Procedure  Neuromuscular Re-education  Therapeutic Activity  Traction  Deep Soft Tissue Massage  Modalities: hot packs and cold packs  Home Exercise Program  Patient/Caregiver Education     Goals:   Patient will be able to sit 45-60 minutes without increased symptoms met  Patient will be able to be on feet 15-20 minutes without increased symptoms met  Patient will be able to walk 20 minutes without increased symptoms  Patient will be independent with home exercise program and home recommendations  progressing     Fall risks Assessment:   No falls in past year. No throw rugs, poorly lighted areas, missing rails/grab bars in the home. No medications or conditions that affect stability or mobility.        Total # of visits: 2/12    Total Treatment Time: 30    Pre pain: 6/10 fatigue level, 0/10 lowback and R lower extremity symptom(s)   Post pain:       6/10    Subjective: Patient had blood transfusion and Fe infusion 3 week(s) ago and that helped bring her numbers up a bit and energy and chest pain with exertion for 1 wk. She went on  her trip 14 days. Now back to normal fatigue that had at her evaluation. She will have blood draw in 4 days. She will also have a upper endo on 06/20/22. She does have history of stomach CA free for 5 yrs and been good. Low back pain doing well and no difficulty with activities of daily living     Objective:   Patient seen for the following treatment:     Therapeutic exercise(s) home exercise program x 30 minutes added  Access Code: [email or web address]  Exercises  - Supine Lower Trunk Rotation  - 1 x daily - 7 x weekly - 1 sets - 10 reps - 3-5 sec hold  - Supine Single Knee to Chest Stretch  - 1 x daily - 7 x weekly - 2 reps - 20 sec hold  - Supine Piriformis Stretch with Foot on Ground  - 1 x daily - 7 x weekly - 2 reps - 20 sec hold  - Supine Hip Adduction Isometric with Ball  - 1 x daily - 7 x weekly - 1 sets - 10 reps - 3-5 sec hold  added  - Supine Bridge with Mini Swiss Ball Between Knees  - 1 x daily - 7 x weekly - 1-3 sets - 10 reps - 3-5 sec hold    Assessment: patient tolerated exercises well  but moderate fatigue symptom(s). She will have further testing for her symptom(s) and condition. Advised patient to continue home exercise program as tolerated but no exacerbation of her fatigue and chest pain symptom(s).     Plan: continue physical therapy - as per plan of care. Patient to have furhter testing and recheck with PCP and reach out for further treatment     Any changes to plan of care, or goals: no   If so then plan of care will be sent to MD for approval.     In the event of unattended discharge, this note will serve as discharge document    [facility]  PI# [id number]

## 2022-06-11 ENCOUNTER — Encounter: Payer: Self-pay | Admitting: "Endocrinology

## 2022-06-11 ENCOUNTER — Ambulatory Visit: Payer: Medicare Other | Attending: "Endocrinology | Admitting: "Endocrinology

## 2022-06-11 VITALS — BP 120/67 | HR 73 | Temp 97.7°F | Ht 62.0 in | Wt 159.4 lb

## 2022-06-11 DIAGNOSIS — I10 Essential (primary) hypertension: Secondary | ICD-10-CM | POA: Insufficient documentation

## 2022-06-11 DIAGNOSIS — Z794 Long term (current) use of insulin: Secondary | ICD-10-CM | POA: Insufficient documentation

## 2022-06-11 DIAGNOSIS — E1129 Type 2 diabetes mellitus with other diabetic kidney complication: Secondary | ICD-10-CM | POA: Insufficient documentation

## 2022-06-11 DIAGNOSIS — Z7984 Long term (current) use of oral hypoglycemic drugs: Secondary | ICD-10-CM

## 2022-06-11 DIAGNOSIS — E119 Type 2 diabetes mellitus without complications: Secondary | ICD-10-CM

## 2022-06-11 MED ORDER — LOSARTAN 25 MG TABLET
25.0000 mg | ORAL_TABLET | Freq: Every day | ORAL | 3 refills | Status: DC
Start: 2022-06-11 — End: 2022-06-18

## 2022-06-11 MED ORDER — ATORVASTATIN 10 MG TABLET: 10.0000 mg | ORAL_TABLET | Freq: Every day | ORAL | 3 refills | Status: DC

## 2022-06-11 MED ORDER — BLOOD-GLUCOSE METER KIT
1.0000 | PACK | Freq: Once | 0 refills | Status: AC
Start: 2022-06-11 — End: 2023-06-06

## 2022-06-11 MED ORDER — EMPAGLIFLOZIN 25 MG TABLET
25.0000 mg | ORAL_TABLET | Freq: Every day | ORAL | 3 refills | Status: DC
Start: 2022-06-11 — End: 2022-06-18

## 2022-06-11 MED ORDER — AMLODIPINE 10 MG TABLET
10.0000 mg | ORAL_TABLET | Freq: Every day | ORAL | 3 refills | Status: DC
Start: 2022-06-11 — End: 2022-06-18

## 2022-06-11 NOTE — Progress Notes (Signed)
Vital signs taken, allergies verified, screened for pain.   Verified Pharmacy   Downloaded Meter today    Brock Larmon, MA

## 2022-06-11 NOTE — Progress Notes (Signed)
Endocrinology Follow up clinic note:    Chief Complaint:  "I'm here to follow up on my diabetes."    HPI: Paula Daugherty is a 77yrold female who has a diagnosis of type 2 diabetes mellitus who presents to Endocrinology for follow up. Her history is as follows:  She was initially diagnosed with diabetes around 2000 on routine labs. She has a strong family history of diabetes so she wasn't surprised at this. She was started on Metformin around 2005 and then she was started on insulin in 2014. She was up to 64 units of Lantus at night. However, after making changes to her diet and lifestyle, she was taken off insulin in 2015. Basal insulin was added back to her regimen in 2018 and she was later able to discontinue this.      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     Interim History: She returns today for follow up. Her last visit with Endocrinology was on 02/19/2022. Since this visit, she states that overall, she has been having a difficult time. She has been dealing with iron deficiency anemia and prior to her recent trip, she needed to receive IV iron and blood transfusion. She has noted with the low blood counts that she is significantly more tired and this limits her physical activity. She has follow up lab work scheduled by Dr. CVirgel Bouquetlater this week and will have a repeat upper endoscopy next week.   From a diabetes standpoint, her glucose was doing well, however, she is wondering if there is an issue with her meter as on her recent cruise, after she packed the meter with her insulin and medications, it started reading high in the 300s most of the time. She also checked her husband's glucose and this was in the 400s, which it has never been.  Neither of them has had symptoms of hyperglycemia at the time their glucose was reading high.     Current Regimen:              Jardiance 25 mg daily              Metformin 1000 mg BID  Hypoglycemia: None recently  Hyperglycemia: Yes,  glucose has been reading higher lately  Blood glucose:              AM: 145 to 387              Afternoon: 130 to 379  Checks blood glucose: 2x/day  Last eye exam: 03/2022, f/up 1 year    ROS:  All other systems were reviewed and are negatative except for pertinent positive and negative responses as documented in HPI.     Medications:  Medication reconciliation was performed today.   Outpatient Medications Marked as Taking for the 06/11/22 encounter (Office Visit) with SCorky Mull MD   Medication Sig Dispense Refill    Amlodipine (NORVASC) 10 mg Tablet Take 1 tablet by mouth every day. 90 tablet 3    Atorvastatin (LIPITOR) 10 mg Tablet Take 1 tablet by mouth every day at bedtime. 90 tablet 3    Blood Glucose Meter (ONETOUCH ULTRA2 METER) Kit 1 kit one time. 1 kit 0    Empagliflozin (JARDIANCE) 25 mg Tablet Take 1 tablet by mouth every day. 90 tablet 3    Losartan (COZAAR) 25 mg Tablet Take 1 tablet by mouth every day. 90 tablet 3    Metformin (GLUCOPHAGE) 1,000 mg  tablet Take 1 tablet by mouth 2 times daily with meals. (diabetes) 180 tablet 3    Omeprazole (PRILOSEC) 20 mg Delayed Release Capsule GENERIC FOR PRILOSEC-- TAKE 1 CAPSULE BY MOUTH TWICE DAILY BEFORE MEALS 180 capsule 1    Ondansetron (ZOFRAN-ODT) 8 mg disintegrating tablet Take 1 tablet by mouth every 8 hours if needed. 100 tablet 1     I did review patient's past medical and family/social history, no changes noted.   PMH:  Past medical history was reviewed from problem list.   Patient Active Problem List   Diagnosis    Diabetes mellitus ([place])    Depression with anxiety    Stress at home    Fibromyalgia    Hypertension    GERD (gastroesophageal reflux disease)    Hyperlipidemia with target LDL less than 70    Abnormal LFTs    Renal cyst ( 6.4 cm cyst left kidney)    Cough    Fatty liver    Macroalbuminuric diabetic nephropathy (HCC)    History of actinic keratoses    Cataract    Ptosis of eyelid    Liver fibrosis    Adrenal nodule (HCC)     Medication Therapy Auth    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HCC)    Mixed conductive and sensorineural hearing loss of both ears    Neoplasm of uncertain behavior of skin    Abdominal pain, generalized    Nausea without vomiting    Asymptomatic microscopic hematuria    Subcutaneous nodule    Angina pectoris (HCC)    Mild episode of recurrent major depressive disorder (HCC)    NASH (nonalcoholic steatohepatitis)    Iron deficiency anemia due to chronic blood loss    Disorder of kidney     VITAL SIGNS:  BP 120/67 (SITE: left arm, Orthostatic Position: sitting, Cuff Size: regular)   Pulse 73   Temp 36.5 C (97.7 F) (Temporal)   Ht 1.575 m ('5\' 2"'$ )   Wt 72.3 kg (159 lb 6.3 oz)   LMP 05/06/1983   SpO2 100%   BMI 29.15 kg/m   Body mass index is 29.15 kg/m.    PHYSICAL EXAM:  General Appearance: healthy, alert, no distress, pleasant affect, cooperative.  Eyes:  conjunctivae and corneas clear. EOM's intact. sclerae normal.  Neck:  Neck supple. No adenopathy, thyroid symmetric, normal size.  Heart:  normal rate and regular rhythm, no murmurs, clicks, or gallops.  Lungs: clear to auscultation.  Extremities:  Trace bilateral lower extremity swelling, R>L, no erythremia or warmth  Foot Exam: normal DP and PT pulses, no trophic changes or ulcerative lesions, normal sensory exam, and normal monofilament exam.  Skin:  Skin color, texture, turgor normal. No rashes or lesions.  Neuro: No tremor appreciated.   Mental Status: Appearance/Cooperation: in no apparent distress and well developed and well nourished  Eye Contact: normal  Speech: normal volume, rate, and pitch    LAB TESTS/STUDIES:   I personally reviewed the following  laboratory and/or imaging studies .   04/25/22 08:27 05/06/22 08:55 05/16/22 08:33 06/09/22 08:12   Ferritin 7 (L) 5 (L) 34    Hgb A1C    5.8 (H)   Hgb A1C,Glucose Est Avg    120      02/10/22 09:19   Sodium 143   Potassium 4.6   Chloride 103   Carbon Dioxide Total 26   Anion Gap 14    Urea Nitrogen, Blood (BUN) 20   Creatinine Blood 1.06  Glucose 169 (H)   Calcium 9.7   Protein 7.6   Albumin 4.3   Alkaline Phosphatase (ALP) 115   Aspartate Transaminase (AST) 39   Bilirubin Total 0.7   Alanine Transferase (ALT) 44 (H)   E-GFR Creatinine (Female) 55   Cholesterol 143   Triglyceride 180 (H)   LDL Cholesterol Calculation 44   HDL Cholesterol 63   Non-HDL Cholesterol 80   Total Cholesterol:HDL Ratio 2.3   Hgb A1C 6.0 (H)   Hgb A1C,Glucose Est Avg 126      05/06/22 08:55 05/16/22 08:33   White Blood Cell Count 5.2 4.8   Red Blood Cell Count 2.92 (L) 3.37 (L)   Hemoglobin 6.5 (L) 8.0 (L)   Hematocrit 21.3 (L) 25.6 (L)   MCV 72.7 (L) 76.2 (L)   MCH 22.3 (L) 23.8 (L)   MCHC 30.7 (L) 31.2 (L)   RDW 17.4 (H) 21.4 (H)   Platelet Count 193 188      02/10/22 09:30   Creatinine Spot Urine 131.3  131.3   Microalbumin Urine 19.0   Microalbumin/Creatinine Ratio 145     Thyroid ultrasound (09/25/2021):  FINDINGS:  Parenchymal echotexture: heterogeneous.  Right lobe: 3.9 x 1.4 x 1.9 cm (5.1 cc).  Nodules:  Subcentimeter nodule(s) present but none that meet criteria for FNA or  follow up.  Left lobe: 4.1 x 1.7 x 2.1 cm (6.9 cc).  Nodules:  1. Mid lobe, 0.9 cm, solid or almost completely solid (2 pts),  hypoechoic (2 pts), wider-than-tall (0 pt), with smooth margins (0 pt).  Macrocalcifications: absent (0 pt). Peripheral rim calcification: absent (0  pt). Punctate echogenic foci: none (0 pt). TR4 - Moderately suspicious.  Subcentimeter nodule(s) present but none that meet criteria for FNA or  follow up.  Isthmus: 0.3 cm.  Nodules:  none.  No abnormal cervical lymph nodes.      IMPRESSION:  1. No nodules meet criteria for FNA or follow-up.    CT Abdomen and Pelvis (08/22/2020):  FINDINGS:  Lower Chest: Unremarkable.  Liver: A few scattered small hypodensities too small to characterize. Mild  liver surface nodularity.  Bile Ducts: Unremarkable.  Gallbladder: Unremarkable.  Pancreas: Unremarkable.  Spleen:  Unremarkable.  Adrenal Glands: Unremarkable.  Kidneys: Decreased size of left posterior renal cyst with some peripheral  calcifications.  GI Tract: Colonic diverticulosis without diverticulitis. Small soft tissue  nodule anterior to the descending colon previously had fat density and  therefore likely represents an small area of fat process or old torsed  epiploic appendage. Normal appendix.  Peritoneal Cavity: No free fluid or free air.  Bladder: Unremarkable.  Uterus and Ovaries: Status post hysterectomy. This 3.0 cm cystic lesion of  the left adnexa that is either new or increased from the prior. It does not  have simple fluid attenuation.  Lymph Nodes: Prominent upper abdominal lymph nodes are slightly increased  including a 1.7 lymph node anterior to the hepatic artery, previously 1.2  cm  Major Vascular Structures: There is atherosclerosis of the aorta and branch  vessels.  Soft Tissues: Unremarkable.  Musculoskeletal: Unremarkable.     IMPRESSION:  1. Diverticulosis without diverticulitis.  2. Increasing/new 3.0 cm nonsimple fluid cystic lesion of the left  adnexa. Recommend further evaluation with pelvic ultrasound.  3. Liver surface nodularity suggesting fibrosis.  4. Slight increased upper abdominal mild lymphadenopathy measuring up  to 1.7 cm is nonspecific. The slow growth argues against an aggressive  process. If patient proves to have chronic liver disease,  mild upper  abdominal lymphadenopathy is often associated with this.    Impression: This is a 77yrold female with Diabetes Mellitus Type II who presents to Endocrinology for follow up. Overall, her glycemic control is at goal as evidenced by her most recent A1c of 5.8%, however, we discussed that this value can be less reliable in the setting of anemia and recent blood transfusion. Her glucose has been running higher on her meter lately without clear explanation. We discussed that his may be meter error and she was prescribed a new meter today. She  will notify me regarding her glucose trends after switching her meter.     She also has a history of an adrenal nodule. Hormonal evaluation was last done and was normal in 04/2021.      DIABETES HISTORY  (-) h/o retinopathy.      (-) h/o microalbuminuria/nephropathy.  (-) h/o neuropathy.  (-) h/o Autonomic dysfunction  (-) h/o Gastroparesis  (-) h/o CAD  (-) h/o PVD     Last eye exam: 03/2022, f/up 1 year  Last urine microalbumin: 02/2022, 145  Pneumovax: 12/2014, Prevnar 2018  Influenza vaccine: fall 2023  (-) ASA  (+) Statin  (+) ACE/ARB          She was incidentally noted to have a thyroid nodule on a CT scan on the neck and dedicated thyroid ultrasound demonstrated a 0.9 cm right TIRADS-4 nodule. We discussed options, including no further follow up, FNA and monitoring with ultrasound and after discussion, Ms. MFincheris in agreement with follow up ultrasound in 1 year's time.      Recommendations:  (E11.29) Type 2 diabetes mellitus with other diabetic kidney complication  (primary encounter diagnosis)  - Continue current doses of Jardiance and metformin   - Encouraged dietary changes as the patient has been working on  - Encouraged patient to continue close blood glucose monitoring, will notify me on glucose trends after switching to new glucometer  - Last LDL at goal, continue statin, repeat due 02/2023  - Last microalbumin:creatinine ratio elevated, continue cozaar, repeat due 02/2023  - Up to date on screening eye exam, f/up as scheduled in 04/2023  - Repeat A1c prior to follow up appointment  - Influenza vaccine and COVID-19 vaccinations up to date  - Discussed daily foot care     2. Adrenal nodule  - Biochemical evaluation normal in 04/2021, stability by imaging in 04/2017  - Hormonal evaluation has been normal x 5 years and may be discontinued at this time     # Thyroid nodule:  - Plan on repeat neck ultrasound in 09/2022 (~1 year from previous) or sooner should the patient notice any changes in her neck, new  lumps/bumps or new difficulties swallowing     Approximately 25 minutes were spent with patient, greater than 50% of which was spent counseling the patient on diabetes management and on coordination of care.    Follow up in 3 months    If you have any questions, please do not hesitate to contact me at [phone]  Thank you for allowing me to participate in the care of this patient.    EDUCATION:  I educated/instructed the patient or caregiver regarding all aspects of the above stated plan of care.  The patient or caregiver indicated understanding.      [place].    Report electronically signed by:  DSunday Spillers M.D.  Clinical Associate Professor  Department of Endocrinology

## 2022-06-12 ENCOUNTER — Ambulatory Visit: Payer: Medicare Other | Admitting: PHYSICAL THERAPIST

## 2022-06-13 ENCOUNTER — Other Ambulatory Visit: Payer: Self-pay

## 2022-06-13 ENCOUNTER — Telehealth: Payer: Self-pay | Admitting: GASTROENTEROLOGY

## 2022-06-13 ENCOUNTER — Ambulatory Visit
Admission: EM | Admit: 2022-06-13 | Discharge: 2022-06-18 | Disposition: A | Discharge status: Home / Self Care | Payer: Medicare Other | Attending: Internal Medicine | Admitting: Internal Medicine

## 2022-06-13 ENCOUNTER — Encounter: Payer: Self-pay | Admitting: Internal Medicine

## 2022-06-13 ENCOUNTER — Ambulatory Visit (INDEPENDENT_AMBULATORY_CARE_PROVIDER_SITE_OTHER): Payer: Medicare Other

## 2022-06-13 ENCOUNTER — Emergency Department (EMERGENCY_DEPARTMENT_HOSPITAL): Payer: Medicare Other

## 2022-06-13 DIAGNOSIS — Z87891 Personal history of nicotine dependence: Secondary | ICD-10-CM | POA: Insufficient documentation

## 2022-06-13 DIAGNOSIS — E1143 Type 2 diabetes mellitus with diabetic autonomic (poly)neuropathy: Secondary | ICD-10-CM

## 2022-06-13 DIAGNOSIS — I4510 Unspecified right bundle-branch block: Secondary | ICD-10-CM | POA: Insufficient documentation

## 2022-06-13 DIAGNOSIS — K3184 Gastroparesis: Secondary | ICD-10-CM

## 2022-06-13 DIAGNOSIS — E785 Hyperlipidemia, unspecified: Secondary | ICD-10-CM | POA: Insufficient documentation

## 2022-06-13 DIAGNOSIS — R079 Chest pain, unspecified: Secondary | ICD-10-CM

## 2022-06-13 DIAGNOSIS — K625 Hemorrhage of anus and rectum: Secondary | ICD-10-CM

## 2022-06-13 DIAGNOSIS — R9431 Abnormal electrocardiogram [ECG] [EKG]: Secondary | ICD-10-CM

## 2022-06-13 DIAGNOSIS — K7460 Unspecified cirrhosis of liver: Secondary | ICD-10-CM | POA: Insufficient documentation

## 2022-06-13 DIAGNOSIS — D62 Acute posthemorrhagic anemia: Principal | ICD-10-CM | POA: Insufficient documentation

## 2022-06-13 DIAGNOSIS — F419 Anxiety disorder, unspecified: Secondary | ICD-10-CM | POA: Insufficient documentation

## 2022-06-13 DIAGNOSIS — N179 Acute kidney failure, unspecified: Secondary | ICD-10-CM

## 2022-06-13 DIAGNOSIS — D3A00 Benign carcinoid tumor of unspecified site: Secondary | ICD-10-CM

## 2022-06-13 DIAGNOSIS — K7581 Nonalcoholic steatohepatitis (NASH): Secondary | ICD-10-CM | POA: Insufficient documentation

## 2022-06-13 DIAGNOSIS — J45909 Unspecified asthma, uncomplicated: Secondary | ICD-10-CM

## 2022-06-13 DIAGNOSIS — F32A Depression, unspecified: Secondary | ICD-10-CM | POA: Insufficient documentation

## 2022-06-13 DIAGNOSIS — K921 Melena: Secondary | ICD-10-CM | POA: Insufficient documentation

## 2022-06-13 DIAGNOSIS — K922 Gastrointestinal hemorrhage, unspecified: Secondary | ICD-10-CM

## 2022-06-13 DIAGNOSIS — Z79899 Other long term (current) drug therapy: Secondary | ICD-10-CM | POA: Insufficient documentation

## 2022-06-13 DIAGNOSIS — Z8719 Personal history of other diseases of the digestive system: Secondary | ICD-10-CM | POA: Insufficient documentation

## 2022-06-13 DIAGNOSIS — E119 Type 2 diabetes mellitus without complications: Secondary | ICD-10-CM

## 2022-06-13 DIAGNOSIS — Z7984 Long term (current) use of oral hypoglycemic drugs: Secondary | ICD-10-CM | POA: Insufficient documentation

## 2022-06-13 DIAGNOSIS — D500 Iron deficiency anemia secondary to blood loss (chronic): Secondary | ICD-10-CM

## 2022-06-13 DIAGNOSIS — D649 Anemia, unspecified: Secondary | ICD-10-CM

## 2022-06-13 DIAGNOSIS — I10 Essential (primary) hypertension: Secondary | ICD-10-CM | POA: Insufficient documentation

## 2022-06-13 DIAGNOSIS — K7400 Hepatic fibrosis, unspecified: Secondary | ICD-10-CM | POA: Insufficient documentation

## 2022-06-13 LAB — CBC WITH DIFFERENTIAL
Basophils % Auto: 0.8 %
Basophils Abs Auto: 0.1 10*3/uL (ref 0.0–0.2)
Eosinophils % Auto: 2 %
Eosinophils Abs Auto: 0.1 10*3/uL (ref 0.0–0.5)
Hematocrit: 20.2 % — ABNORMAL LOW (ref 36.0–46.0)
Hemoglobin: 6.1 g/dL — ABNORMAL LOW (ref 12.0–16.0)
Lymphocytes % Auto: 23.5 %
Lymphocytes Abs Auto: 1.6 10*3/uL (ref 1.0–4.8)
MCH: 21.8 pg — ABNORMAL LOW (ref 27.0–33.0)
MCHC: 30.2 % — ABNORMAL LOW (ref 32.0–36.0)
MCV: 72.2 fL — ABNORMAL LOW (ref 80.0–100.0)
MPV: 7.6 fL (ref 6.8–10.0)
Monocytes % Auto: 7.7 %
Monocytes Abs Auto: 0.5 10*3/uL (ref 0.1–0.8)
Neutrophils % Auto: 66 %
Neutrophils Abs Auto: 4.5 10*3/uL (ref 1.8–7.7)
Platelet Count: 227 10*3/uL (ref 130–400)
RDW: 20.7 % — ABNORMAL HIGH (ref 0.0–14.7)
Red Blood Cell Count: 2.81 10*6/uL — ABNORMAL LOW (ref 4.00–5.20)
White Blood Cell Count: 6.9 10*3/uL (ref 4.5–11.0)

## 2022-06-13 LAB — RED BLOOD CELLS ADULT
Unit Blood Type Code: 5100
Unit Blood Type Code: 5100
Unit Blood Type: O POS
Unit Blood Type: O POS
Unit Expiration: 202403062359
Unit Expiration: 202403072359
Unit Volume: 300 ML
Unit Volume: 300 ML

## 2022-06-13 LAB — BASIC METABOLIC PANEL
Anion Gap: 13 mmol/L (ref 7–15)
Calcium: 9 mg/dL (ref 8.6–10.0)
Carbon Dioxide Total: 24 mmol/L (ref 22–29)
Chloride: 104 mmol/L (ref 98–107)
Creatinine Serum: 1.16 mg/dL (ref 0.51–1.17)
E-GFR Creatinine (Female): 50 mL/min/{1.73_m2}
Glucose: 121 mg/dL — ABNORMAL HIGH (ref 74–109)
Potassium: 4.1 mmol/L (ref 3.4–5.1)
Sodium: 141 mmol/L (ref 136–145)
Urea Nitrogen, Blood (BUN): 26 mg/dL — ABNORMAL HIGH (ref 6–20)

## 2022-06-13 LAB — CBC NO DIFFERENTIAL
Hematocrit: 19.4 % — ABNORMAL LOW (ref 36.0–46.0)
Hemoglobin: 5.8 g/dL — ABNORMAL LOW (ref 12.0–16.0)
MCH: 21.4 pg — ABNORMAL LOW (ref 27.0–33.0)
MCHC: 29.8 % — ABNORMAL LOW (ref 32.0–36.0)
MCV: 71.9 fL — ABNORMAL LOW (ref 80.0–100.0)
MPV: 8 fL (ref 6.8–10.0)
Platelet Count: 219 10*3/uL (ref 130–400)
RDW: 19.9 % — ABNORMAL HIGH (ref 0.0–14.7)
Red Blood Cell Count: 2.69 10*6/uL — ABNORMAL LOW (ref 4.00–5.20)
White Blood Cell Count: 5.1 10*3/uL (ref 4.5–11.0)

## 2022-06-13 LAB — TROPONIN T
TROPONIN T: 9 ng/L (ref <=19)
TROPONIN T: 9 ng/L (ref <=19)

## 2022-06-13 LAB — TYPE AND SCREEN
Ab Scrn-Gel: NEGATIVE
Patient Blood Type: O POS

## 2022-06-13 LAB — FERRITIN: Ferritin: 7 ng/mL — ABNORMAL LOW (ref 13–150)

## 2022-06-13 LAB — INR
INR: 1.13 (ref 0.87–1.18)
Prothrombin Time: 10.3 secs (ref 8.4–10.7)

## 2022-06-13 LAB — NT-PRO B-TYPE NATRIURETIC PEPTIDE: NT-PRO B-Type Natriuretic Peptide: 122 pg/mL (ref <=125)

## 2022-06-13 MED ORDER — NACL 0.9% IV INFUSION
INTRAVENOUS | Status: DC
Start: 2022-06-13 — End: 2022-06-13

## 2022-06-13 MED ORDER — MELATONIN 3 MG TABLET
3.0000 mg | ORAL_TABLET | Freq: Every day | ORAL | Status: DC | PRN
Start: 2022-06-13 — End: 2022-06-18
  Administered 2022-06-13 – 2022-06-17 (×3): 3 mg via ORAL
  Filled 2022-06-13 (×3): qty 1

## 2022-06-13 MED ORDER — BISACODYL 5 MG TABLET,DELAYED RELEASE
10.0000 mg | DELAYED_RELEASE_TABLET | ORAL | Status: DC | PRN
Start: 2022-06-13 — End: 2022-06-18

## 2022-06-13 MED ORDER — GLUCOSE 4 GRAM CHEWABLE TABLET
12.0000 g | CHEWABLE_TABLET | ORAL | Status: DC | PRN
Start: 2022-06-13 — End: 2022-06-18

## 2022-06-13 MED ORDER — DIPHENHYDRAMINE-ZINC ACETATE 1 %-0.1 % TOPICAL CREAM
TOPICAL_CREAM | Freq: Three times a day (TID) | TOPICAL | Status: DC | PRN
Start: 2022-06-13 — End: 2022-06-18

## 2022-06-13 MED ORDER — GLUCAGON HCL 1 MG/ML SOLUTION FOR INJECTION
1.0000 mg | INTRAMUSCULAR | Status: DC | PRN
Start: 2022-06-13 — End: 2022-06-18

## 2022-06-13 MED ORDER — D10W IV BOLUS - HYPOGLYCEMIA
10.0000 g | INTRAVENOUS | Status: DC | PRN
Start: 2022-06-13 — End: 2022-06-18

## 2022-06-13 MED ORDER — EMPAGLIFLOZIN 25 MG TABLET
25.0000 mg | ORAL_TABLET | Freq: Every day | ORAL | Status: DC
Start: 2022-06-14 — End: 2022-06-18
  Filled 2022-06-13: qty 1

## 2022-06-13 MED ORDER — PANTOPRAZOLE 40 MG INTRAVENOUS SOLUTION
80.0000 mg | Freq: Once | INTRAVENOUS | Status: AC
Start: 2022-06-13 — End: 2022-06-13
  Administered 2022-06-13: 80 mg via INTRAVENOUS
  Filled 2022-06-13: qty 80

## 2022-06-13 MED ORDER — PROCHLORPERAZINE EDISYLATE 10 MG/2 ML (5 MG/ML) INJECTION SOLUTION
5.0000 mg | Freq: Four times a day (QID) | INTRAMUSCULAR | Status: DC | PRN
Start: 2022-06-13 — End: 2022-06-18

## 2022-06-13 MED ORDER — NACL 0.9% IV INFUSION
INTRAVENOUS | Status: DC
Start: 2022-06-13 — End: 2022-06-18

## 2022-06-13 MED ORDER — ONDANSETRON HCL (PF) 4 MG/2 ML INJECTION SOLUTION
4.0000 mg | Freq: Three times a day (TID) | INTRAMUSCULAR | Status: DC | PRN
Start: 2022-06-13 — End: 2022-06-18
  Administered 2022-06-14 – 2022-06-17 (×2): 4 mg via INTRAVENOUS
  Filled 2022-06-13 (×3): qty 2

## 2022-06-13 MED ORDER — INSULIN ASPART (U-100) 100 UNIT/ML (3 ML) SUBCUTANEOUS PEN
1.0000 [IU] | PEN_INJECTOR | SUBCUTANEOUS | Status: DC
Start: 2022-06-13 — End: 2022-06-17
  Administered 2022-06-15: 1 [IU] via SUBCUTANEOUS
  Filled 2022-06-13: qty 300

## 2022-06-13 MED ORDER — CAMPHOR-MENTHOL 0.5 %-0.5 % LOTION
TOPICAL_LOTION | Freq: Three times a day (TID) | TOPICAL | Status: DC | PRN
Start: 2022-06-13 — End: 2022-06-18

## 2022-06-13 MED ORDER — ATORVASTATIN 10 MG TABLET
10.0000 mg | ORAL_TABLET | Freq: Every day | ORAL | Status: DC
  Administered 2022-06-13 – 2022-06-17 (×5): 10 mg via ORAL
  Filled 2022-06-13 (×5): qty 1

## 2022-06-13 MED ORDER — PANTOPRAZOLE 40 MG INTRAVENOUS SOLUTION
40.0000 mg | Freq: Two times a day (BID) | INTRAVENOUS | Status: DC
Start: 2022-06-13 — End: 2022-06-17
  Administered 2022-06-14 – 2022-06-17 (×7): 40 mg via INTRAVENOUS
  Filled 2022-06-13 (×7): qty 40

## 2022-06-13 NOTE — ED Provider Notes (Signed)
EMERGENCY DEPARTMENT PHYSICIAN NOTE - [name]      Date of Service: 06/13/2022  5:54 PM Patient's PCP: Nino Glow   Note Started: 06/13/2022 19:30 DOB: 11/29/1945      Chief Complaint   Patient presents with    Abnormal Lab Values       The history provided by the patient.    Paula Daugherty is a 77yrold female, who has a past medical history significant for carcinogenic tumor , presenting to the ED with abnormal lab values. Patient referred to the ED by GI for anemia (Hb 5.8) and a blood transfusion. Has had blood transfusions in the past. She endorse several months of black and tarry stools and has been following with GI. Has an appointment for an endoscopy next week. She recently [place] lightheadedness and shortness of breath with walking. Denies abdominal pain, chest pain, hematemesis, hematochezia, nausea, or vomiting.   Carcinoc tumor     ED Referral Order Placed in Last 24 hours (reviewed)  . Patient condition: Unknown  .   .Marland Kitchen    Physician Name (and contact information, if appropriate): EGlyn Ade MD pager [phone]  Reason for ED Referral (add any pertinent details here): Drop in hgb level to 5.8. Reports intermittent rectal bleeding and black tarry stools for past few weeks.       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)    Psychiatric illness    Patient Active Problem List    Diagnosis Date Noted    Adrenal nodule ([place] 12/29/2015     Priority: High     Class: Acute    Disorder of kidney 04/17/2022    Iron deficiency anemia due to chronic blood loss 11/29/2021    NASH (nonalcoholic steatohepatitis) 08/10/2018    Mild episode of recurrent major depressive disorder ([place] 05/12/2018    Angina pectoris  ([place] 09/23/2017    Subcutaneous nodule 07/14/2017    Asymptomatic microscopic hematuria 03/08/2017    Abdominal pain, generalized 12/07/2016    Nausea without vomiting 12/07/2016    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/20/2016    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    Medication Therapy Auth 03/19/2016    Liver fibrosis 12/12/2015    Ptosis of eyelid 08/06/2015    Cataract 12/18/2014    History of actinic keratoses 12/06/2014    Macroalbuminuric diabetic nephropathy ([place] 09/12/2014    Fatty liver 08/28/2014    Cough 12/14/2013    Renal cyst ( 6.4 cm cyst left kidney) 08/24/2013    Hyperlipidemia with target LDL less than 70 07/28/2013    Abnormal LFTs 07/28/2013    Diabetes mellitus ([place] 07/11/2013    Depression with anxiety 07/11/2013    Stress at home 07/11/2013    Fibromyalgia 07/11/2013    Hypertension 07/11/2013    GERD (gastroesophageal reflux disease) 07/11/2013      Past Surgical History:  10/21/2017: Biofeedback peri/uro/rectal  11/04/2017: Biofeedback peri/uro/rectal  11/18/2017: Biofeedback peri/uro/rectal  12/02/2017: Biofeedback  peri/uro/rectal  12/30/2017: Biofeedback peri/uro/rectal  03/23/2018: Biofeedback peri/uro/rectal  01/27/2018: Capsule endoscopy  01/29/2018: Capsule endoscopy  12/23/2021: Capsule endoscopy  01/04/2014: Colonoscopy  12/16/2017: Colonoscopy  04/05/2021: Colonoscopy  04/2020: Egd  04/08/2019: Egd  04/05/2021: Egd  05/02/2018: Hc biofeedback peri/uro/rectal  12/16/2017: Hc colonoscopy,remv lesn,snare  1985: Mammoplasty, reduction  09/02/2017: Manometry  02/12/2015: Phacoemulsification, cataract; Right  02/12/2016: Pr esophagogastroduodenoscopy transoral diagnostic  06/11/2016: Pr esophagogastroduodenoscopy transoral diagnostic  No date: Pr esophagogastroduodenoscopy transoral diagnostic  04/2017: Pr esophagogastroduodenoscopy transoral diagnostic  04/20/2018: Pr  esophagogastroduodenoscopy transoral diagnostic  1980s: Pr knee scope,diagnostic  No date: Pr ligate fallopian tube  No date: Pr repair tympanic membrane  1980s: Pr total abdom hysterectomy  08/06/2015: Repair, blepharoptosis; Bilateral No current outpatient medications on file.   Social History     Tobacco Use    Smoking status: Former     Packs/day: 0.10     Years: 20.00     Additional pack years: 0.00     Total pack years: 2.00     Types: Cigarettes     Passive exposure: Never    Smokeless tobacco: Never    Tobacco comments:     quit 30 years ago   Substance Use Topics    Alcohol use: Yes     Alcohol/week: 0.0 standard drinks of alcohol     Comment: rare    Drug use: No     Social History     Social History Narrative    Not on file    Family History   Problem Relation Age of Onset    Diabetes Father     Heart Mother         MI at 75s     Non-contributory Brother     Non-contributory Brother         TRIAGE VITAL SIGNS:  Temp: 36.4 C (97.6 F) (06/13/22 1754)  Temp src: Temporal (06/13/22 1754)  Pulse: 98 (06/13/22 1754)  BP: (!) 145/80 (06/13/22 1754)  Resp: 18 (06/13/22 1754)   SpO2: 100 % (06/13/22 1754)  Weight: 73.1 kg (161 lb 2.5 oz) (06/13/22 1754)    Physical Exam  Vitals and nursing note reviewed.   Constitutional:       General: She is not in acute distress.  HENT:      Head: Normocephalic and atraumatic.      Right Ear: External ear normal.      Left Ear: External ear normal.   Eyes:      General:         Right eye: No discharge.         Left eye: No discharge.      Extraocular Movements: Extraocular movements intact.      Conjunctiva/sclera: Conjunctivae normal.   Cardiovascular:      Rate and Rhythm: Normal rate and regular rhythm.      Heart sounds: Normal heart sounds.   Pulmonary:      Effort: Pulmonary effort is normal. No respiratory distress.      Breath sounds: Normal breath sounds. No stridor. No wheezing, rhonchi or rales.   Abdominal:      General: There is no distension.      Palpations:  Abdomen is soft.      Tenderness: There is no abdominal tenderness.   Musculoskeletal:         General: Normal range of motion.      Cervical back: Normal range of motion and neck supple.   Skin:  General: Skin is warm and dry.      Capillary Refill: Capillary refill takes 2 to 3 seconds.      Coloration: Skin is pale.   Neurological:      General: No focal deficit present.      Mental Status: She is alert and oriented to person, place, and time.   Psychiatric:         Mood and Affect: Mood normal.         Behavior: Behavior normal.              MEDICAL DECISION MAKING  Differential includes, but is not limited to: GI beed, PUD, recurrent tumor, or AVM     Pertinent lab results:  Labs Reviewed   CBC WITH DIFFERENTIAL - Abnormal       Result Value    White Blood Cell Count 6.9      Red Blood Cell Count 2.81 (*)     Hemoglobin 6.1 (*)     Hematocrit 20.2 (*)     MCV 72.2 (*)     MCH 21.8 (*)     MCHC 30.2 (*)     RDW 20.7 (*)     MPV 7.6      Platelet Count 227      Neutrophils % Auto 66.0      Lymphocytes % Auto 23.5      Monocytes % Auto 7.7      Eosinophils % Auto 2.0      Basophils % Auto 0.8      Neutrophils Abs Auto 4.5      Lymphocytes Abs Auto 1.6      Monocytes Abs Auto 0.5      Eosinophils Abs Auto 0.1      Basophils Abs Auto 0.1     BASIC METABOLIC PANEL - Abnormal    Sodium 141      Potassium 4.1      Chloride 104      Carbon Dioxide Total 24      Anion Gap 13      Urea Nitrogen, Blood (BUN) 26 (*)     Creatinine Serum 1.16      Glucose 121 (*)     Calcium 9.0      E-GFR Creatinine (Female) 50     INR - Normal    Prothrombin Time 10.3      INR 1.13     TROPONIN T - Normal    TROPONIN T 9      Narrative:     [facility] uses a GEN 5 STAT cardiac Troponin T assay that demonstrates < 10% CV (imprecision) at the 99th percentile upper reference limit (URL).  The 99th percentile URL for this assay is 19 ng/L.    High sensitivity cardiac troponin results should never be used with other contemporary  troponin assays, and Troponin I should never be used interchangeably with Troponin T.   NT-PRO B-TYPE NATRIURETIC PEPTIDE - Normal    NT-PRO B-Type Natriuretic Peptide 122     TROPONIN T - Normal    TROPONIN T 9      Narrative:     [facility] uses a GEN 5 STAT cardiac Troponin T assay that demonstrates < 10% CV (imprecision) at the 99th percentile upper reference limit (URL).  The 99th percentile URL for this assay is 19 ng/L.    High sensitivity cardiac troponin results should never be used with other contemporary troponin assays, and Troponin I  should never be used interchangeably with Troponin T.   ED HCV SCREEN WITH REFLEX   TYPE AND SCREEN    Patient Blood Type O Positive      Ab Scrn-Gel Negative     RED BLOOD CELLS ADULT    Unit Product Code [id number]      Product Name RED BLOOD CELLS/CPD>AS1/579m/refg/ResLeu:<5E6      Unit Number [id number]     Crossmatch Interpretation Compatible      Unit Status XM      Unit Status Crossmatch      Unit Blood Type Code 5100      Unit Blood Type O Positive      Unit Expiration [id number]     Unit Expiration 07/10/2022 at 2359      Unit Volume 300      Unit Product Code [id number]     Product Name RED BLOOD CELLS/CPD>AS1/5038mrefg/ResLeu:<5E6      Unit Number [id number]    Crossmatch Interpretation Compatible      Unit Status TR      Unit Status Transfuse      Unit Blood Type Code 5100      Unit Blood Type O Positive      Unit Expiration [id number]    Unit Expiration 07/09/2022 at 2359      Unit Volume 300     RED BLOOD CELLS ADULT         Pertinent imaging results (interpreted independently by me):   DX CHEST 2 VIEWS  No PNA    Radiology reads reviewed:   I reviewed the initial radiology imaging reads that were available prior to discharge or transfer of care.     ECG (interpreted independently by me): ECG as interpreted by me reveals sinus rhythm, normal axis, normal intervals including QRS and QTc, no ST elevation/depression, no aVR  pathology    Previous encounter and outside records reviewed:  Outpatient Notes:05/09/22 clinic visit for a blood transfusion        PATIENT SUMMARY:   Paula Cothrans a [age] female presenting with anemia likely secondary to an upper GI bleed of unclear etiology. EGD several months ago unremarkable but plan for repeat EGD next week. However, Hgb now 6 with active melena. Consulted GI who recommends admission. Blood transfusion initiated.             ED Medication Administration through 06/13/2022 2136       Date/Time Order Dose Route Action    06/13/2022 1938 PST Pantoprazole (PROTONIX) Injection 80 mg 80 mg IV Given          Disposition: Admit  Admission service: (A) [place] Case discussed with the follow service(s):  [facility] ONLY)  CLINICAL CASE MANAGEMENT CONSULT  Hospitalist Consult Details  Diagnosis: GI bleed, melena, anemia (Hgb 6)  Additional details (why does the patient require admission/additional services involved/etc):GI consulted and concerned for possible high risk lesion, would like admission with urgent EGD in AM and likely monitoring throughout the weekend. 2 units PRBCs, protonix ordered.     Brief details of discussion(s): Discussed admission with consult service.    Clinical Impression:  Upper GI bleed  Anemia  History of carcinoid tumor    LAST VITAL SIGNS  Temp: 36.8 C (98.3 F) (06/13/22 2030)  Temp src: 104  Pulse: 84 (06/13/22 2100)  Blood Pressure: 120/71 (06/13/22 2100)  Resp Rate: 16 (  06/13/22 2030)  SpO2: 98 (06/13/22 2030)  Weight: 73.1 (161 lb 2.5 oz) (06/13/22 1754)    PATIENT'S GENERAL CONDITION:  Serious: Indicators are questionable.    SCRIBE STATEMENT  I, Neville Route, SCRIBE,  am personally taking down the notes in the presence of Dr. Kathlyn Sunol.  Electronically signed by - Neville Route, [place], Scribe  06/13/2022  20:28    SCRIBE DISCLAIMER  I, Jeralyn Ruths, MD, personally performed the services  described in this documentation, as scribed by the trained medical scribe above in my presence, and it is both accurate and complete.             I personally interpreted the imaging/tracings and agree with the ED interpretations documented above.    A medical screening exam was performed.    Electronically signed by: Jeralyn Ruths, MD - Attending Physician

## 2022-06-13 NOTE — Progress Notes (Signed)
Pre GI procedure call:  3 patient identifications obtained. Patient confirmed EGD appointment on 06/20/22 at 0900, aware must have a driver secured for after procedure. Patient confirmed prep instructions were received. Instructed to call [facility] if any questions or concerns at [phone] option # 3. All questions answered and verbalized understanding.     Ehren Berisha Cazares-Mariscal, LVN   [facility]

## 2022-06-13 NOTE — ED Rapid Medical Evaluation (Signed)
Emergency Department Rapid Medical Evaluation Note  Chief Complaint   Patient presents with    Abnormal Lab Values       Paula Daugherty is a 77yrold female who presents to the ED with sent for low Hb 5.8 on opt labs today, pale, gets SOB, intermittent signs GIB, has duod tumor.     ED Referral Order Placed in Last 24 hours (reviewed)  . Patient condition: Unknown  .   .Marland Kitchen    Physician Name (and contact information, if appropriate): EGlyn Ade MD pager [phone]  Reason for ED Referral (add any pertinent details here): Drop in hgb level to 5.8. Reports intermittent rectal bleeding and black tarry stools for past few weeks.       Plan: lab(s) and/or imaging and complete ED evaluation by an ED Treatment Team.             Electronically signed by: JWeber Cooks MD     This patient was seen as part of a rapid medical evaluation. Please see the ED Provider Note for full details of this patient's care. This note is not intended to be a comprehensive document of the patient's ED visit and does not represent documentation of a medical screening exam.    This patient was instructed that this preliminary assessment and any studies obtained do not constitute the patient's full workup.

## 2022-06-13 NOTE — Telephone Encounter (Signed)
Test Results drop in Hgb level to 5.8    Secure chat message received from [name], MD:  Pt's Hgb is 5.8, please advise pt to go to the ER for evaluation + transfusion.     Called pt. Spoke with Paula Daugherty, 3 patient identifiers used.    Pt informed of test results, drop in hemoglobin level to 5.8. Dr. Virgel Bouquet recommends pt should go to the ER now for evaluation and transfusion.      Pt was A & O x 4 during phone with RN. No signs of confusion or lethargy.   Pt reports intermittent rectal bleeding and black tarry stools over past few weeks, states this is chronic for her. She is scheduled for outpatient EGD on 06/20/2022.     Pt denies any of the following signs/symptoms:  SOB  Chest pain  Lethargy or Confusion  Dizziness or faintness  Vomiting blood   Abdominal cramps    Pt was advised of MD's recommendation to go to the ER for evaluation/treatment and blood transfusion. Pt was agreeable to go to the ER, states husband is with her and will bring her to the ER. Pt states she will be coming to [facility] ER.     All questions were answered. Pt had no further questions or concerns.     RN placed a ED RN referral.      Thank you,   Paula Daugherty, BSN RN   [facility]

## 2022-06-13 NOTE — ED Triage Note (Addendum)
Pt referred to ED for decreased in hemoglobin (5.8) after blood draw this morning. PT reports DOE and intermittent BRB per rectum with dark stools. PT appears in NAD speaking in full sentences. HX of duodenal CX

## 2022-06-13 NOTE — H&P (Signed)
INTERNAL MEDICINE ADMISSION ADULT HISTORY & PHYSICAL  Date of Service: 06/13/2022 Patient's PCP: Nino Glow               CHIEF COMPLAINT:    Low hemoglobin (5.8) on outpt labs.    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  Patient has been experiencing progressive fatigue,  chest pain, and dyspnea on exertion in the recent days to weeks. Of note, patient follows with Dr. Glyn Ade and had a last appointment on 04/30/2022 with plans for repeat EGD on 06/20/2022. Patient receives PRBC and iron infusions as outpatient and last PRBC transfusion was on 05/16/2022. Today when she had repeat lab work, critically low hgb of 5.8 and  hct 19.2 was noted and patient was asked by Dr. Virgel Bouquet to come to the ED. GI was consulted and will evaluate patient for any endoscopic procedures.       In the ED, repeat blood work noted hemoglobin level of 6.1, hematocrit 20.2, troponin 9, bnp 122.     On examination, patient denies any symptoms at rest. But does report of recent progressive fatigue, chest pain, occasional palpitations and dyspnea on exertion. She also reports occasional nausea with her gastroparesis, but nothing unusual.  Denies any dizziness, lightheadedness.       PAST MEDICAL HISTORY:  Past Medical History:   Diagnosis Date    Angina pectoris ([place]) 09/23/2017    Anxiety     Asthma     Cirrhosis ([place])     Constipation, chronic     Depression     Diabetes mellitus ([place])     Fatty liver     Hypertension     Kidney disease 2015    cyst left kidney per pt    Liver fibrosis     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/20/2016    Primary  neuroendocrine carcinoma of duodenum ([place]) 04/17/2016    Psychiatric illness        Past Surgical History:   Procedure Laterality Date    BIOFEEDBACK PERI/URO/RECTAL  10/21/2017    session #1: N Score = 5.4    BIOFEEDBACK PERI/URO/RECTAL  11/04/2017    session #2    BIOFEEDBACK PERI/URO/RECTAL  11/18/2017    Session #3    BIOFEEDBACK PERI/URO/RECTAL  12/02/2017    Session #4    BIOFEEDBACK PERI/URO/RECTAL  12/30/2017    Session #5    BIOFEEDBACK PERI/URO/RECTAL  03/23/2018    Session #6    CAPSULE ENDOSCOPY  01/27/2018    CAPSULE ENDOSCOPY  01/29/2018         CAPSULE ENDOSCOPY  12/23/2021    wnl    COLONOSCOPY  01/04/2014    polyp--TA and erosion; hemorrhoids; tics; repeat x 3 yrs    COLONOSCOPY  12/16/2017    TA; repeat in 3 yrs    COLONOSCOPY  04/05/2021    polyps--path pending; hemorrhoids    EGD  04/2020    gastritis    EGD  04/08/2019    biopsy path--pending    EGD  04/05/2021    biopsies at tattoo--path pending    [facility] BIOFEEDBACK PERI/URO/RECTAL  05/02/2018  [facility] COLONOSCOPY,REMV LESN,SNARE  12/16/2017    MAMMOPLASTY, REDUCTION  1985    MANOMETRY  09/02/2017    work on constipation; kegels and biofeedback; proceed w/colonoscopy     PHACOEMULSIFICATION, CATARACT Right 02/12/2015    with IOL    PR ESOPHAGOGASTRODUODENOSCOPY TRANSORAL DIAGNOSTIC  02/12/2016    EGD--polyp--path pending; antral gastritis; no varices     PR ESOPHAGOGASTRODUODENOSCOPY TRANSORAL DIAGNOSTIC  06/11/2016    EGD--biopsy at site of carcinoid tumor path pending     PR ESOPHAGOGASTRODUODENOSCOPY TRANSORAL DIAGNOSTIC      EGD--folds in duodenum--path pending     PR ESOPHAGOGASTRODUODENOSCOPY TRANSORAL DIAGNOSTIC  04/2017    EGD; repeat EGD in 1 year     PR ESOPHAGOGASTRODUODENOSCOPY TRANSORAL DIAGNOSTIC  04/20/2018    gastritis; nodule--path pending    PR KNEE SCOPE,DIAGNOSTIC  1980s    Knee arthroscopy    PR LIGATE FALLOPIAN TUBE      Tubal ligation    PR REPAIR TYMPANIC MEMBRANE      Tympanoplasty    PR TOTAL ABDOM HYSTERECTOMY  1980s     partial; no BSO    REPAIR, BLEPHAROPTOSIS Bilateral 08/06/2015    Blepharoplasty bilateral upper lids       Social History     Socioeconomic History    Marital status: MARRIED    Number of children: 1   Occupational History    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 [employer]    Tobacco Use    Smoking status: Former     Packs/day: 0.10     Years: 20.00     Additional pack years: 0.00     Total pack years: 2.00     Types: Cigarettes     Passive exposure: Never    Smokeless tobacco: Never    Tobacco comments:     quit 30 years ago   Substance and Sexual Activity    Alcohol use: Yes     Alcohol/week: 0.0 standard drinks of alcohol     Comment: rare    Drug use: No    Sexual activity: Yes     Partners: Male       Family History   Problem Relation Name Age of Onset    Diabetes Father      Heart Mother          MI at 14s     Non-contributory Brother      Non-contributory Brother         Allergies   Allergen Reactions    Sulfa (Sulfonamide Antibiotics) Rash    Contrast Dye [Radiopaque Agent] Hives    Hydrochlorothiazide Other-Reaction in Comments     Patient reports    Januvia [Sitagliptin] Other-Reaction in Comments     Patient reports    Keflex [Cephalexin] Abdominal Pain    Lyrica [Pregabalin] Other-Reaction in Comments     Patient reports    Omnipaque [Iohexol] Other-Reaction in Comments     Patient reports    Prozac [Fluoxetine Hcl] Other-Reaction in Comments     Patient reports    Topiramate Other-Reaction in Comments     Hairfall       Prior to Admission Medications:    Prior to Admission Medications   Prescriptions Last Dose Informant Patient Reported? Taking?   Albuterol (PROAIR HFA, PROVENTIL HFA, VENTOLIN HFA) 90 mcg/actuation inhaler   No No   Sig: INHALE 1 TO 2 PUFFS BY MOUTH EVERY 6 HOURS AS NEEDED FOR WHEEZING   Amlodipine (NORVASC) 10 mg Tablet  No No   Sig: Take 1 tablet by mouth every day.   Atorvastatin (LIPITOR) 10 mg Tablet   No No   Sig: Take 1 tablet by mouth every day at bedtime.   Blood Glucose  Meter (ONETOUCH ULTRA2 METER) Kit   No No   Sig: 1 kit one time.   CloNIDine (CATAPRES) 0.1 mg Tablet   No No   Sig: Take 1 tablet by mouth every day at bedtime. Indications: "change of life" signs   Empagliflozin (JARDIANCE) 25 mg Tablet   No No   Sig: Take 1 tablet by mouth every day.   Losartan (COZAAR) 25 mg Tablet   No No   Sig: Take 1 tablet by mouth every day.   Metformin (GLUCOPHAGE) 1,000 mg tablet   No No   Sig: Take 1 tablet by mouth 2 times daily with meals. (diabetes)   NYSTATIN 100,000 unit/gram Powder   No No   Sig: Apply a thin layer of powder to affected area 4 times a day as needed   Omeprazole (PRILOSEC) 20 mg Delayed Release Capsule   No No   Sig: GENERIC FOR PRILOSEC-- TAKE 1 CAPSULE BY MOUTH TWICE DAILY BEFORE MEALS   Ondansetron (ZOFRAN-ODT) 8 mg disintegrating tablet   No No   Sig: Take 1 tablet by mouth every 8 hours if needed.   metoclopramide HCl (REGLAN PO)   Yes No   Sig: Take 5 mg by mouth if needed (NAUSEA).      Facility-Administered Medications: None     x4    REVIEW OF SYSTEMS:  CONSTITUTIONAL: Denies any fever, chills, dizziness  NEUROLOGIC: Denies any headache, neck pain, numbness or tingling of the extremities  EENT: Denies any  discharge, vision changes, hearing changes, sore throat.  CARDIOVASCULAR:  Denies any chest pain, palpitations. LE edema, PND, orthopnea  RESPIRATORY: Denies any shortness of breath, DOE, cough  GASTROINTESTINAL: Denies any abdominal pain, N/V/D/C, melena.  GENITOURINARY: Denies dysuria, frequency, urgency.  MUSCULOSKELETAL: Denies arthralgias or myalgias.  INTEGUMENTARY: Denies bruising, abrasions.   PSYCHIATRIC: Denies any auditory hallucinations,  visual hallucinations, SI, HI  ENDOCRINE: Denies fatigue,  weakness,  history of thyroid, diabetes or adrenal problems.  HEMATOLOGICAL: Denies easily bleeding,  petechiae, bruising.    VITAL SIGNS:  Summary  Temp Min: 36.4 C (97.6 F) Max: 36.8 C (98.3 F)  BP: (120-145)/(64-80)   Pulse Min: 84 Max: 98   Resp Min: 16 Max: 18  SpO2 Min: 96 % Max: 100 %       Current Vitals  Temp: 36.8 C (98.3 F)  BP: 120/71  Pulse: 84  Resp: 16  SpO2: 98 %      Weight: 73.1 kg (161 lb 2.5 oz)  Body surface area is 1.79 meters squared.  Body mass index is 29.48 kg/m.    PHYSICAL EXAMINATION:  CONSTITUTIONAL: NAD. No fever, chills, dizziness, weakness  NEUROLOGIC: PERRL, Alert and oriented x4. MAE. Follows commands. No numbness/tingling   EENT:  No epistaxis. Hearing intact. Mucous membranes moist and pale. Neck supple.  RESPIRATORY: Lung sounds CTAB. Respirations unlabored. No use of accessory muscles.  CARDIOVASCULAR: HR regular. S1 S2. No murmur. No chest pain. No edema. PPP  GASTROINTESTINAL: abdomen soft/ NT/ ND.   GENITOURINARY: Voiding spontaneously without any discomfort.   MUSCULOSKELETAL: No obvious deformities. Active ROM.   INTEGUMENTARY: No rashes. Appears pale   PSYCHIATRIC: calm and cooperative      Results for orders placed or performed during the hospital encounter of 06/13/22  CBC with Differential     Status: Abnormal   Result Value Status    White Blood Cell Count 6.9 Final    Red Blood Cell Count 2.81 (L) Final    Hemoglobin 6.1 (L) Final    Hematocrit 20.2 (L) Final    MCV 72.2 (L) Final    MCH 21.8 (L) Final    MCHC 30.2 (L) Final    RDW 20.7 (H) Final    MPV 7.6 Final    Platelet Count 227 Final    Neutrophils % Auto 66.0 Final    Lymphocytes % Auto 23.5 Final    Monocytes % Auto 7.7 Final    Eosinophils % Auto 2.0 Final    Basophils % Auto 0.8 Final    Neutrophils Abs Auto 4.5 Final    Lymphocytes Abs Auto 1.6 Final    Monocytes Abs Auto 0.5 Final    Eosinophils Abs Auto 0.1 Final    Basophils Abs Auto 0.1 Final   Basic Metabolic Panel     Status: Abnormal   Result Value Status    Sodium 141 Final    Potassium 4.1 Final    Chloride 104 Final    Carbon Dioxide Total 24 Final    Anion Gap 13 Final    Urea Nitrogen, Blood (BUN) 26 (H) Final    Creatinine Serum 1.16 Final    Glucose 121 (H) Final     Calcium 9.0 Final    E-GFR Creatinine (Female) 50 Final   INR     Status: Normal   Result Value Status    Prothrombin Time 10.3 Final    INR 1.13 Final   Troponin T     Status: Normal   Result Value Status    TROPONIN T 9 Final   NT-PRO B-Type Natriuretic Peptide     Status: Normal   Result Value Status    NT-PRO B-Type Natriuretic Peptide 122 Final   Troponin T     Status: Normal   Result Value Status    TROPONIN T 9 Final         IMAGING:          ASSESSMENT & PLAN:  Paula Daugherty is a 77 year old female with PMH significant for HTN, Anxiety, asthma, depression, NASH, liver fibrosis, DM II, gastroparesis, 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  Patient has been experiencing progressive fatigue, chest pain, and dyspnea on exertion in the recent days to weeks.  Hgb noted to be 5.8 on outpt labs and 6.1 in the ED.     # Acute blood loss anemia r/o GI bleed  # BRBPR  # h/o Neuroendocrine tumor of the duodenum, diagnosed 02/2016  Patient presented with hgb 5.8 on outpt labs.  Patient had an EGD on 04/05/2021 which was normal with no endoscopic evidence of neoplasm or no mention of any bleeding. Pathology for duodenal mucosa was negative for malignancy. Colonoscopy on 04/05/2021 noted benign appearing polyps, colonic diverticulosis, and external hemorrhoids. Pathology for colonic polyp was positive for tubular adenoma.  - repeat labs with hgb 6.1  - plan to transfuse 2 units of PRBCs.   - received Protonix 80 mg IV x1   - start Protonix 40 mg IVP q12h  - GI consulted, patient will be evaluated by GI team.   -  clear liquid diet for now. NPO after midnight    # Chest pain  Patient reports chest pain for past several days to weeks. Accompanied by progressive fatigue and exertional dyspnea. Low yield for  ACS  - Troponin 9, BNP 122  - no chest pain at this time  - will obtain EKG    # HTN  Home regimen include Losartan 25 mg daily, Amlodipine 10 mg daily  - BP stable in 120s/80s  - will hold antihypertensives for now given anemia, likely due to blood loss.    # DM Type 2  # Gastroparesis  Home regimen includes Metformin 1000 mg BID, Empagliflozin 25 mg daily   - Resume Empagliflozin 25 mg daily  - sensitive novolog ISS    # Hyperlipidemia  - Resume Atorvastatin 10 mg qhs     # Anxiety   # Depression  - stable. Patient does not endorse any such feelings.       HOSPITAL BUNDLE:  F/E/N: CLEAR LIQUID DIET  NPO, AFTER MIDNIGHT     DVT Prophylaxis: Sequential compression devices (SCDs)  Code Status: Full      Disposition:  Admit inpatient       PRESENT ON ADMISSION:  Are any of the following five conditions present or suspected on admission: decubitus ulcer, infection from an intravascular device, infection due to an indwelling catheter, surgical site infection or pneumonia? No.        Jonni Sanger, FNP-C  Nurse Practitioner  [facility] Medicine APP Team   Available on Tiger[employer]  PI [phone]    06/13/2022  22:14

## 2022-06-14 ENCOUNTER — Other Ambulatory Visit: Payer: Self-pay

## 2022-06-14 DIAGNOSIS — D509 Iron deficiency anemia, unspecified: Secondary | ICD-10-CM

## 2022-06-14 LAB — POC GLUCOSE
POC GLUCOSE: 126 mg/dL — ABNORMAL HIGH (ref 70–99)
POC GLUCOSE: 123 mg/dL — ABNORMAL HIGH (ref 70–99)
POC GLUCOSE: 145 mg/dL — ABNORMAL HIGH (ref 70–99)
POC GLUCOSE: 160 mg/dL — ABNORMAL HIGH (ref 70–99)

## 2022-06-14 LAB — CBC WITH DIFFERENTIAL
Basophils % Auto: 0.9 %
Basophils Abs Auto: 0 10*3/uL (ref 0.0–0.2)
Eosinophils % Auto: 2.3 %
Eosinophils Abs Auto: 0.1 10*3/uL (ref 0.0–0.5)
Hematocrit: 28.4 % — ABNORMAL LOW (ref 36.0–46.0)
Hemoglobin: 9.1 g/dL — ABNORMAL LOW (ref 12.0–16.0)
Lymphocytes % Auto: 26.4 %
Lymphocytes Abs Auto: 1.5 10*3/uL (ref 1.0–4.8)
MCH: 24.1 pg — ABNORMAL LOW (ref 27.0–33.0)
MCHC: 32.1 % (ref 32.0–36.0)
MCV: 75 fL — ABNORMAL LOW (ref 80.0–100.0)
MPV: 7.7 fL (ref 6.8–10.0)
Monocytes % Auto: 9.1 %
Monocytes Abs Auto: 0.5 10*3/uL (ref 0.1–0.8)
Neutrophils % Auto: 61.3 %
Neutrophils Abs Auto: 3.5 10*3/uL (ref 1.8–7.7)
Platelet Count: 188 10*3/uL (ref 130–400)
RDW: 20.6 % — ABNORMAL HIGH (ref 0.0–14.7)
Red Blood Cell Count: 3.78 10*6/uL — ABNORMAL LOW (ref 4.00–5.20)
White Blood Cell Count: 5.7 10*3/uL (ref 4.5–11.0)

## 2022-06-14 LAB — CBC NO DIFFERENTIAL
Hematocrit: 29.9 % — ABNORMAL LOW (ref 36.0–46.0)
Hemoglobin: 9.6 g/dL — ABNORMAL LOW (ref 12.0–16.0)
MCH: 24 pg — ABNORMAL LOW (ref 27.0–33.0)
MCHC: 32 % (ref 32.0–36.0)
MCV: 75.2 fL — ABNORMAL LOW (ref 80.0–100.0)
MPV: 7.9 fL (ref 6.8–10.0)
Platelet Count: 216 10*3/uL (ref 130–400)
RDW: 20.9 % — ABNORMAL HIGH (ref 0.0–14.7)
Red Blood Cell Count: 3.98 10*6/uL — ABNORMAL LOW (ref 4.00–5.20)
White Blood Cell Count: 6 10*3/uL (ref 4.5–11.0)

## 2022-06-14 LAB — BASIC METABOLIC PANEL
Anion Gap: 10 mmol/L (ref 7–15)
Calcium: 8.8 mg/dL (ref 8.6–10.0)
Carbon Dioxide Total: 25 mmol/L (ref 22–29)
Chloride: 105 mmol/L (ref 98–107)
Creatinine Serum: 1.03 mg/dL (ref 0.51–1.17)
E-GFR Creatinine (Female): 57 mL/min/{1.73_m2}
Glucose: 113 mg/dL — ABNORMAL HIGH (ref 74–109)
Potassium: 3.8 mmol/L (ref 3.4–5.1)
Sodium: 140 mmol/L (ref 136–145)
Urea Nitrogen, Blood (BUN): 18 mg/dL (ref 6–20)

## 2022-06-14 LAB — ED HCV SCREEN WITH REFLEX: Hepatitis C Ab Screen: NONREACTIVE

## 2022-06-14 LAB — MAGNESIUM (MG): Magnesium (Mg): 2 mg/dL (ref 1.6–2.4)

## 2022-06-14 MED ORDER — ACETAMINOPHEN 325 MG TABLET
650.0000 mg | ORAL_TABLET | ORAL | Status: DC | PRN
Start: 2022-06-14 — End: 2022-06-14

## 2022-06-14 MED ORDER — INSULIN ASPART (U-100) 100 UNIT/ML (3 ML) SUBCUTANEOUS PEN
1.0000 [IU] | PEN_INJECTOR | Freq: Three times a day (TID) | SUBCUTANEOUS | Status: DC
Start: 2022-06-14 — End: 2022-06-18
  Administered 2022-06-14 – 2022-06-18 (×4): 1 [IU] via SUBCUTANEOUS
  Filled 2022-06-14 (×2): qty 300

## 2022-06-14 MED ORDER — ACETAMINOPHEN 325 MG TABLET
650.0000 mg | ORAL_TABLET | ORAL | Status: DC | PRN
Start: 2022-06-14 — End: 2022-06-18
  Administered 2022-06-14 – 2022-06-18 (×3): 650 mg via ORAL
  Filled 2022-06-14 (×3): qty 2

## 2022-06-14 MED ORDER — TRAZODONE 50 MG TABLET
25.0000 mg | ORAL_TABLET | Freq: Once | ORAL | Status: AC
Start: 2022-06-14 — End: 2022-06-14
  Administered 2022-06-14: 25 mg via ORAL
  Filled 2022-06-14: qty 1

## 2022-06-14 NOTE — Consults (Signed)
Inpatient GENERAL GASTROENTEROLOGY  Initial Consult  Date of Service: 06/14/2022  Requesting Physician/Service: Dr. Scharlene Corn    Name:Paula Daugherty  MRN: [id number]  DOB: Apr 18, 1946     Reason for Consult: symptomatic anemia w/ suspected upper gi bleed          Assessment    52yrfemale with DM2, NASH with F3 fibrosis, gastroparesis, duodenal carcinoid tumor s/p EMR 2017 with neg PET and no recurrence on annual upper endoscopies, and iron-deficiency anemia requiring IV iron infusions and recently 1 prbc prior to admission with planned outpatient upper endoscopy 2/16; outpatient labs yesterday showed hb of 5.8, thus pt was advised to present to ED for further evaluation. She received transfusion in ED and is now admitted for further management of symptomatic anemia/suspected upper GI bleed.       Recommendation(s):  - EGD under moderate sedation planned. Unable to perform this today and updated primary team. [facility] with moderate sedation. If unable to do EGD tomorrow, will plan for Monday in GI lab.   - PPI IV BID, maintain atleast two large bore PIVs, transfuse hb <7     D/w attending, Dr. MNicola PoliceA. LLaurina Bustle MD  PGY-5, GI/Hepatology Fellow     I agree with the Fellow's findings and plan we developed as written.     Report electronically signed by:  ECarmie End MD  Associate Professor  Department of Medicine  Division of Gastroenterology and Hepatology       Subjective   Subjective     History of Present Illness: Paula Wormuthis a 89y[place] with DM2, NASH with F3 fibrosis, gastroparesis, duodenal carcinoid tumor s/p EMR 2017 with neg PET and no recurrence on annual upper endoscopies, and iron-deficiency anemia requiring IV iron infusions and recently 1 prbc prior to admission with planned outpatient upper endoscopy 2/16; outpatient labs yesterday showed hb of 5.8, thus pt was advised to present to ED for further evaluation. She received transfusion in  ED and is now admitted for further management of symptomatic anemia/suspected upper GI bleed.     Pt has chronic iron-deficiency anemia.  Last endoscopies (all under moderate sedation-tolerated well):  -04/2021 EGD: normal, no residual tumor in duodenum  -04/2021 Colonoscopy: normal, 5 polyps resected, moderate left-sided diverticulosis  -12/2021 capsule endoscopy: normal     She has noticed intermittent dark, tarry stools over last months. For the last 1-2 weeks, each bowel movement has been black and tarry in consistency.     Pt received IV iron infusions in August 2023. She remains iron-deficiency despite this. She required 1 unit of prbc in January 2024 (none in 2023). No prior endoscopies have revealed a souce of GI blood loss. Prior duodenal biopsies did not show features of celiac disease. No anticoagulation or NSAIDs.     She follows very closely in clinic with Dr. ChVirgel Bouquetor years.     Normal vitals on presentation   BUN 26, Cr 1.16   Hb 5.8 -> 2 units prbc -> 9.1 today            Review of Systems     10 point   ROS obtained, all are negative except as mentioned above.    NaMeriel   has a past medical history of Angina pectoris ([place](09/23/2017), Anxiety, Asthma, Cirrhosis ([facility] Constipation, chronic, Depression, Diabetes mellitus ([place] Fatty liver, Hypertension, Kidney disease (2015), Liver fibrosis, Neoplasm of uncertain behavior  of skin (05/20/2016), 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[place]) (04/17/2016), and Psychiatric illness.    She has no past medical history of Arthritis, Balance problem, Bleeding disorder (HCC), COPD (chronic obstructive pulmonary disease) ([place]), Crohn's disease ([place]), Deep venous thrombosis ([place]), Food intolerance, Gastrointestinal ulcer, H/O shortness of breath, Heart failure ([place]), Human immunodeficiency virus disease ([place]), Leukemia ([place]), Malignant hyperthermia, Myocardial infarction ([place]), Obstructive sleep apnea, PONV (postoperative nausea and vomiting), Pulmonary  embolism ([place]), Stroke ([place]), Systemic lupus erythematosus arthritis ([place]), Ulcerative colitis ([place]), or Vascular disease.  has a past surgical history that includes pr ligate fallopian tube; pr total abdom hysterectomy (1980s); pr knee scope,diagnostic (1980s); pr repair tympanic membrane; colonoscopy (01/04/2014); mammoplasty, reduction (1985); phacoemulsification, cataract (Right, 02/12/2015); repair, blepharoptosis (Bilateral, 08/06/2015); pr esophagogastroduodenoscopy transoral diagnostic (02/12/2016); pr esophagogastroduodenoscopy transoral diagnostic (06/11/2016); egd (04/2020); pr esophagogastroduodenoscopy transoral diagnostic; pr esophagogastroduodenoscopy transoral diagnostic (04/2017); manometry (09/02/2017); biofeedback peri/uro/rectal (10/21/2017); biofeedback peri/uro/rectal (11/04/2017); biofeedback peri/uro/rectal (11/18/2017); biofeedback peri/uro/rectal (12/02/2017); colonoscopy (12/16/2017); hc colonoscopy,remv lesn,snare (12/16/2017); biofeedback peri/uro/rectal (12/30/2017); capsule endoscopy (01/27/2018); capsule endoscopy (01/29/2018); biofeedback peri/uro/rectal (03/23/2018); pr esophagogastroduodenoscopy transoral diagnostic (04/20/2018); hc biofeedback peri/uro/rectal (05/02/2018); egd (04/08/2019); egd (04/05/2021); colonoscopy (04/05/2021); and capsule endoscopy (12/23/2021).  family history includes Diabetes in her father; Heart in her mother; Non-contributory in her brother and brother. social history includes:  reports that she has quit smoking. Her smoking use included cigarettes. She has a 2.00 pack-year smoking history. She has never been exposed to tobacco smoke. She has never used smokeless tobacco. She reports current alcohol use. She reports that she does not use drugs.     Home Medications:   Albuterol (PROAIR HFA, PROVENTIL HFA, VENTOLIN HFA) 90 mcg/actuation inhaler    Amlodipine (NORVASC) 10 mg Tablet    Atorvastatin (LIPITOR) 10 mg Tablet    Blood Glucose Meter (ONETOUCH  ULTRA2 METER) Kit    CloNIDine (CATAPRES) 0.1 mg Tablet    Empagliflozin (JARDIANCE) 25 mg Tablet    Losartan (COZAAR) 25 mg Tablet    Metformin (GLUCOPHAGE) 1,000 mg tablet    metoclopramide HCl (REGLAN PO)    NYSTATIN 100,000 unit/gram Powder    Omeprazole (PRILOSEC) 20 mg Delayed Release Capsule    Ondansetron (ZOFRAN-ODT) 8 mg disintegrating tablet   Current Medications:   Atorvastatin (LIPITOR) Tablet 10 mg    Bisacodyl (DULCOLAX) EC Tablet 10 mg    Camphor/Menthol (SARNA) Lotion    Dextrose 10% 10-20 g IVPB 100-200 mL    Diphenhydramine-Zinc Acetate (BENADRYL ITCH-RELIEF) 1-0.1 % Cream    [Held by provider] Empagliflozin (JARDIANCE) Tablet 25 mg    Glucagon Injection 1 mg    Glucose Chewable Tablet 12-24 g    Insulin Aspart (NOVOLOG) Injection Pen 1-20 Units    Melatonin Tablet 3 mg    NaCl 0.9% Infusion    Ondansetron (ZOFRAN) Injection 4 mg    Pantoprazole (PROTONIX) Injection 40 mg    Prochlorperazine (COMPAZINE) Injection 5-10 mg     Allergies: Sulfa (sulfonamide antibiotics), Contrast dye [radiopaque agent], Hydrochlorothiazide, Januvia [sitagliptin], Keflex [cephalexin], Lyrica [pregabalin], Omnipaque [iohexol], Prozac [fluoxetine hcl], and Topiramate         Physical Exam     Gen: Awake, resting comfortably in bed, nad  HEENT: EOMI, no scleral icterus  Neck: supple, trachea midline  CV: RRR, no LE edema  Resp: equal chest expansion on room air, no wheeze  GI: abdomen is soft, nd, mildly ttp in lower abdomen  MSK: moves all 4 extremities, no LE edema  Neuro: AOX3, no focal deficits  Procedural Planning:   Patient is a candidate for moderate sedation.  Patient is ASA status:  3 - Moderate to severe systemic disease that limits activity but not incapacitating    Airway Assessment:    Mallampati image    Mallampati Class:  2, Oral Eval: Mouth opening normal, Neck ROM:  full, Thyroid-mentum distance in fingerbreaths: 2.    Patient/family understanding:  verbalizesThe procedure, risks, benefits, and  alternatives were explained.  All patient questions were answered.  The informed consent was signed.    Patient barriers to learning:  none     Weight: 73.1 kg (161 lb 2.5 oz) Body mass index is 29.48 kg/m.Body surface area is 1.79 meters squared.  24 Hour Range Current   BP  Min: 116/57  Max: 145/80   135/70   Temp  Min: 36.4 C (97.5 F)  Max: 36.9 C (98.5 F)  36.8 C (98.2 F)   Pulse  Min: 68  Max: 98  72   Resp  Min: 16  Max: 18  16   SpO2  Min: 95 %  Max: 100 %  96 %           06/14/22  0524   WBC 5.7   HGB 9.1*   HCT 28.4*   PLT 188          06/14/22  0524   GLU 113*   BUN 18   CR 1.03   NA 140   K 3.8   CL 105   CO2 25   CA 8.8            06/13/22  1838   INR 1.13          06/13/22  1043   FRTN 7*        Imaging Studies:   Reviewed prior    Procedures:  Reviewed           Alanda Amass, MD  *PHYSICIAN: FELLOW  06/14/2022 10:32

## 2022-06-14 NOTE — Care Plan (Addendum)
Problem: Adult Inpatient Plan of Care  Goal: Plan of Care Review  Outcome: Ongoing, Progressing  Flowsheets (Taken 06/14/2022 1400)  Outcome Evaluation: pt AOx4, moves all extremities denies any weakness and numbness, walking to the BR with steady gait, voiding without any difficulty,  started on diet, po toelrated well, on cardiac monitoring, tele tech did not report any changed. Pt on cardiac tele, tech did not report any changed.

## 2022-06-14 NOTE — Care Plan (Signed)
Problem: Adult Inpatient Plan of Care  Goal: Plan of Care Review  06/14/2022 0612 by Delma Officer, RN  Outcome: Ongoing, Progressing  Flowsheets (Taken 06/14/2022 0612)  Plan of Care Reviewed With: patient     Primary Problem: Iron deficiency anemia due to chronic blood loss     Night uneventful. AOx4. Denies pain. SR on the monitor with no ectopy observed. Normotensive. Post transfusion 2u PRBC with no reaction noted. Ambulate standby assist to the bathroom with no complaint. No melena noted. Sleep intermittently. No acute changes. Anticipate home discharge when stable.     Plan in next 24 hours: NPO post midnight for EGD

## 2022-06-14 NOTE — Nurse Assessment (Signed)
ASSESSMENT NOTE    Note Started: 06/14/2022, 19:34     Initial assessment completed and recorded in EMR.  Report received from day shift nurse and orders reviewed. Plan of Care reviewed and appropriate, discussed with patient.  Demetrius Revel, RN RN

## 2022-06-14 NOTE — Progress Notes (Signed)
INTERNAL MEDICINE DAILY PROGRESS NOTE    Date: 06/14/2022 Time: 08:41    HANDOFF SUMMARY  GI bleed, melena, anemia (Hgb 6):GI consulted and concerned for possible high risk lesion, would like admission with urgent EGD in AM and likely monitoring throughout the weekend. 2 units PRBCs, protonix ordered.        CHIEF COMPLAINT  Low hemoglobin (5.8) on outpt labs.     INTERVAL EVENTS  - no acute events overnight  - EGD tentatively tomorrow    SUBJECTIVE  Pt seen and examined, doing OK, offers no new complaints, just waiting for EGD.  No chest pain, no SOB, no N/V        Medications  Atorvastatin (LIPITOR) Tablet 10 mg, ORAL, Daily Bedtime  Empagliflozin (JARDIANCE) Tablet 25 mg, ORAL, QAM  Insulin Aspart (NOVOLOG) Injection Pen 1-20 Units, SUBCUTANEOUS, Q4H (0200-2200) - Insulin  Pantoprazole (PROTONIX) Injection 40 mg, IV, Q12H Now        Bisacodyl (DULCOLAX) EC Tablet 10 mg, ORAL, Q24H PRN  Camphor/Menthol (SARNA) Lotion, TOPICAL, TID PRN  Dextrose 10% 10-20 g IVPB 100-200 mL, IV, PRN  Diphenhydramine-Zinc Acetate (BENADRYL ITCH-RELIEF) 1-0.1 % Cream, TOPICAL, TID PRN  Glucagon Injection 1 mg, IM, PRN  Glucose Chewable Tablet 12-24 g, ORAL, PRN  Melatonin Tablet 3 mg, ORAL, Bedtime PRN  Ondansetron (ZOFRAN) Injection 4 mg, IV, Q8H PRN  Prochlorperazine (COMPAZINE) Injection 5-10 mg, IV, Q6H PRN        NaCl 0.9%, , IV, CONTINUOUS        OBJECTIVE    Vitals Signs  Current Vitals  Temp: 36.8 C (98.2 F)  BP: 135/70  Pulse: 72  Resp: 16  SpO2: 96 %     Weight: 73.1 kg (161 lb 2.5 oz)    Intake and Output  Last Two Completed Shifts  In: 600 [Blood:600]  Out: -     Current Shift  No intake/output data recorded.      General Appearance: Awake and alert, pleasant and cooperative, no acute distress, answers questions appropriately  Cardiovascular: Regular rate and rhythm, no murmurs, rubs or gallops  Lungs: clear breath sounds.  No tachypnea.    Abdomen: Soft, nondistended, no tenderness to palpation  Back:  No spinal or  paraspinal tenderness to palpation.    Extremities: Warm and perfused, no edema, palpable peripheral pulses  Neuro: Symmetrical strength in the upper and lower extremities  Mental Status: No evidence of altered sensorium   Lines and Drains     Peripheral IV 06/13/22 20 g over-the-needle catheter Right Antecubital (Active)        Labs and Studies  I have reviewed the following information from the last 24 hours: allied health and treating physician notes, imaging, labs and microbiology data, cardiac studies, and telemetry data (if on monitor)    ASSESSMENT AND PLAN  Athziry Wynne is a 77 year old female with PMH significant for HTN, Anxiety, asthma, depression, NASH, liver fibrosis, DM II, gastroparesis, 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  Patient has been experiencing progressive fatigue, chest pain, and dyspnea on exertion in the recent days to weeks.  Hgb noted to be 5.8 on outpt labs and 6.1 in the ED.      # Acute blood loss anemia r/o GI bleed  # BRBPR  # h/o Neuroendocrine tumor of  the duodenum, diagnosed 02/2016  Patient presented with hgb 5.8 on outpt labs.  Patient had an EGD on 04/05/2021 which was normal with no endoscopic evidence of neoplasm or no mention of any bleeding. Pathology for duodenal mucosa was negative for malignancy. Colonoscopy on 04/05/2021 noted benign appearing polyps, colonic diverticulosis, and external hemorrhoids. Pathology for colonic polyp was positive for tubular adenoma.  S/p 2 unit pRBC and Protonix 80 mg x 1  - GI consulted and EGD tentatively planned for tomorrow   - continue Protonix 40 mg IVP q12h  - keep NPO P MN  - continue to trend CBC every 12 hrs       # Chest pain   Patient reports chest pain for past several days to weeks. Accompanied by  progressive fatigue and exertional dyspnea. Low yield for ACS suspect demand ischemia.  EKG with no ST elevation or T wave inversion  - Troponin 9, BNP 122  - no chest pain at this time       # HTN  Home regimen include Losartan 25 mg daily, Amlodipine 10 mg daily.  BP stable in 120s/80s  - hold home antihypertensives for now given anemia     # DM Type 2  # Gastroparesis  Home regimen includes Metformin 1000 mg BID, Empagliflozin 25 mg daily   - hold home Empagliflozin 25 mg daily  - sensitive novolog ISS     # Hyperlipidemia  - Resume Atorvastatin 10 mg qhs      # Anxiety   # Depression  - stable. Patient does not endorse any such feelings.     FEN: NPO, AFTER MIDNIGHT. LBM: Last Bowel Movement: 06/12/22 (06/13/22 2353)  DVT Prophylaxis: VTE prophylaxis contraindicated 2/2 GIB  Advanced Care Planning/Goals of Care: Full   Disposition: DC home    Total time I spent in care of this patient today (excluding time spent on other billable services) was 55 minutes. This time does not overlap with other providers involved in this patient's care.      Report Electronically Signed by:     Aram Beecham, PA-C  Physician [place] APP   Date: 06/14/2022 Time: 08:41

## 2022-06-15 ENCOUNTER — Other Ambulatory Visit: Payer: Self-pay

## 2022-06-15 LAB — CBC WITH DIFFERENTIAL
Basophils % Auto: 0.7 %
Basophils Abs Auto: 0 10*3/uL (ref 0.0–0.2)
Eosinophils % Auto: 2.4 %
Eosinophils Abs Auto: 0.1 10*3/uL (ref 0.0–0.5)
Hematocrit: 30.2 % — ABNORMAL LOW (ref 36.0–46.0)
Hemoglobin: 9.4 g/dL — ABNORMAL LOW (ref 12.0–16.0)
Lymphocytes % Auto: 26.8 %
Lymphocytes Abs Auto: 1.6 10*3/uL (ref 1.0–4.8)
MCH: 23.5 pg — ABNORMAL LOW (ref 27.0–33.0)
MCHC: 31.1 % — ABNORMAL LOW (ref 32.0–36.0)
MCV: 75.5 fL — ABNORMAL LOW (ref 80.0–100.0)
MPV: 7.7 fL (ref 6.8–10.0)
Monocytes % Auto: 9 %
Monocytes Abs Auto: 0.5 10*3/uL (ref 0.1–0.8)
Neutrophils % Auto: 61.1 %
Neutrophils Abs Auto: 3.7 10*3/uL (ref 1.8–7.7)
Platelet Count: 209 10*3/uL (ref 130–400)
RDW: 21.3 % — ABNORMAL HIGH (ref 0.0–14.7)
Red Blood Cell Count: 4 10*6/uL (ref 4.00–5.20)
White Blood Cell Count: 6.1 10*3/uL (ref 4.5–11.0)

## 2022-06-15 LAB — BASIC METABOLIC PANEL
Anion Gap: 12 mmol/L (ref 7–15)
Calcium: 9.5 mg/dL (ref 8.6–10.0)
Carbon Dioxide Total: 26 mmol/L (ref 22–29)
Chloride: 103 mmol/L (ref 98–107)
Creatinine Serum: 1.16 mg/dL (ref 0.51–1.17)
E-GFR Creatinine (Female): 50 mL/min/{1.73_m2}
Glucose: 121 mg/dL — ABNORMAL HIGH (ref 74–109)
Potassium: 3.8 mmol/L (ref 3.4–5.1)
Sodium: 141 mmol/L (ref 136–145)
Urea Nitrogen, Blood (BUN): 19 mg/dL (ref 6–20)

## 2022-06-15 LAB — POC GLUCOSE
POC GLUCOSE: 135 mg/dL — ABNORMAL HIGH (ref 70–99)
POC GLUCOSE: 122 mg/dL — ABNORMAL HIGH (ref 70–99)

## 2022-06-15 LAB — HIV AG/AB COMBO SCREEN: HIV Ag/Ab Combo Screen: NONREACTIVE

## 2022-06-15 MED ORDER — TRAZODONE 50 MG TABLET
25.0000 mg | ORAL_TABLET | Freq: Every day | ORAL | Status: DC | PRN
Start: 2022-06-15 — End: 2022-06-18
  Administered 2022-06-15 – 2022-06-17 (×3): 25 mg via ORAL
  Filled 2022-06-15 (×3): qty 1

## 2022-06-15 NOTE — Nurse Assessment (Signed)
ASSESSMENT NOTE    Note Started: 06/15/2022, 21:10     Initial assessment completed and recorded in EMR. Report received from day shift nurse and orders reviewed. Plan of Care reviewed and appropriate, discussed with patient. VSS. Call light within reach. Patient aware that she'll be NPO at midnight for EGD I AM. Maylon Peppers, RN

## 2022-06-15 NOTE — Nurse Assessment (Signed)
ASSESSMENT NOTE    Note Started: 06/15/2022, 07:00     Initial assessment completed and recorded in EMR.  Report received from day shift nurse and orders reviewed. Plan of Care reviewed and appropriate, discussed with patient.  Frutoso Schatz,  RN

## 2022-06-15 NOTE — Care Plan (Addendum)
Problem: Adult Inpatient Plan of Care  Goal: Plan of Care Review  Outcome: Ongoing, Progressing    Plan of Care Reviewed With: patient  Progress: no change  1724-arrived her to ward.     Pt A/Ox4 on RA. Denied pain @ this time. Pt transfer herself to hospital bed from [facility]. Pt OOB ad lib to BR. On tele. Pt currently in bed watching t.v. w/ husband @ bedside, call light w/in reach, encouraged pt to call for assistance, and safety rounding conducted.   Goal: Patient-Specific Goal (Individualized)  Outcome: Ongoing, Progressing  Goal: Absence of Hospital-Acquired Illness or Injury  Outcome: Ongoing, Progressing  Goal: Optimal Comfort and Wellbeing  Outcome: Ongoing, Progressing  Goal: Readiness for Transition of Care  Outcome: Ongoing, Progressing     Problem: Fall Injury Risk  Goal: Absence of Fall and Fall-Related Injury  Outcome: Ongoing, Progressing

## 2022-06-15 NOTE — Progress Notes (Signed)
INTERNAL MEDICINE DAILY PROGRESS NOTE    Date: 06/15/2022 Time: 12:10    HANDOFF SUMMARY  GI bleed, melena, anemia (Hgb 6):GI consulted and concerned for possible high risk lesion, would like admission with urgent EGD in AM and likely monitoring throughout the weekend. 2 units PRBCs, protonix ordered.        CHIEF COMPLAINT  Low hemoglobin (5.8) on outpt labs.     INTERVAL EVENTS  - no acute events overnight  - EGD cancelled today, tentatively tomorrow    SUBJECTIVE  Pt seen and examined, doing OK, offers no new complaints, patient is eager to watch super ball.  No chest pain, no SOB, no N/V        Medications  Atorvastatin (LIPITOR) Tablet 10 mg, ORAL, Daily Bedtime  [Held by provider] Empagliflozin (JARDIANCE) Tablet 25 mg, ORAL, QAM  [Held by provider] Insulin Aspart (NOVOLOG) Injection Pen 1-20 Units, SUBCUTANEOUS, Q4H (0200-2200) - Insulin  Insulin Aspart (NOVOLOG) Injection Pen 1-20 Units, SUBCUTANEOUS, TID w/ meals  Pantoprazole (PROTONIX) Injection 40 mg, IV, Q12H Now        Acetaminophen (TYLENOL) Tablet 650 mg, ORAL, Q4H PRN  Bisacodyl (DULCOLAX) EC Tablet 10 mg, ORAL, Q24H PRN  Camphor/Menthol (SARNA) Lotion, TOPICAL, TID PRN  Dextrose 10% 10-20 g IVPB 100-200 mL, IV, PRN  Diphenhydramine-Zinc Acetate (BENADRYL ITCH-RELIEF) 1-0.1 % Cream, TOPICAL, TID PRN  Glucagon Injection 1 mg, IM, PRN  Glucose Chewable Tablet 12-24 g, ORAL, PRN  Melatonin Tablet 3 mg, ORAL, Bedtime PRN  Ondansetron (ZOFRAN) Injection 4 mg, IV, Q8H PRN  Prochlorperazine (COMPAZINE) Injection 5-10 mg, IV, Q6H PRN        NaCl 0.9%, , IV, CONTINUOUS        OBJECTIVE    Vitals Signs  Current Vitals  Temp: 36.7 C (98 F)  BP: (!) 141/83  Pulse: 71  Resp: 18  SpO2: 97 %     Weight: 72.6 kg (160 lb)    Intake and Output  Last Two Completed Shifts  In: 840 [Oral:840]  Out: 0 [Stool:0]    Current Shift  No intake/output data recorded.      General Appearance: Awake and alert, pleasant and cooperative, no acute distress, answers questions  appropriately, appears comfortable  Cardiovascular: Regular rate and rhythm, no murmurs, rubs or gallops  Lungs: clear breath sounds B/L.  No tachypnea.    Abdomen: Soft, nondistended, no tenderness to palpation  Back:  No spinal or paraspinal tenderness to palpation.    Extremities: Warm and perfused, no edema, palpable peripheral pulses  Neuro: Symmetrical strength in the upper and lower extremities  Mental Status: No evidence of altered sensorium     Lines and Drains     Peripheral IV 06/13/22 20 g over-the-needle catheter Right Antecubital (Active)       Peripheral IV 06/15/22 20 g Anterior;Left Forearm (Active)        Labs and Studies  I have reviewed the following information from the last 24 hours: allied health and treating physician notes, imaging, labs and microbiology data, cardiac studies, and telemetry data (if on monitor)    ASSESSMENT AND PLAN  Paula Daugherty is a 77 year old female with PMH significant for HTN, Anxiety, asthma, depression, NASH, liver fibrosis, DM II, gastroparesis, 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  Patient has been experiencing  progressive fatigue, chest pain, and dyspnea on exertion in the recent days to weeks.  Hgb noted to be 5.8 on outpt labs and 6.1 in the ED.      # Acute blood loss anemia r/o GI bleed  # BRBPR  # h/o Neuroendocrine tumor of the duodenum, diagnosed 02/2016  Patient presented with hgb 5.8 on outpt labs.  Patient had an EGD on 04/05/2021 which was normal with no endoscopic evidence of neoplasm or no mention of any bleeding. Pathology for duodenal mucosa was negative for malignancy. Colonoscopy on 04/05/2021 noted benign appearing polyps, colonic diverticulosis, and external hemorrhoids. Pathology for colonic polyp was positive for tubular  adenoma.  S/p 2 unit pRBC and Protonix 80 mg x 1  - GI consulted and EGD tentatively planned for tomorrow   - continue Protonix 40 mg IVP q12h  - keep NPO P MN  - continue to trend CBC every 12 hrs       # Chest pain   Patient reports chest pain for past several days to weeks. Accompanied by progressive fatigue and exertional dyspnea. Low yield for ACS suspect demand ischemia.  EKG with no ST elevation or T wave inversion  - Troponin 9, BNP 122  - no chest pain at this time       # HTN  Home regimen include Losartan 25 mg daily, Amlodipine 10 mg daily.  BP stable in 120s/80s  - hold home antihypertensives for now given anemia     # DM Type 2  # Gastroparesis  Home regimen includes Metformin 1000 mg BID, Empagliflozin 25 mg daily   - hold home Empagliflozin 25 mg daily  - sensitive novolog ISS     # Hyperlipidemia  - Resume Atorvastatin 10 mg qhs      # Anxiety   # Depression  - stable. Patient does not endorse any such feelings.     FEN: NPO, AFTER MIDNIGHT  REGULAR DIET. LBM: Last Bowel Movement: 06/12/22 (06/13/22 2353)  DVT Prophylaxis: VTE prophylaxis contraindicated 2/2 GIB  Advanced Care Planning/Goals of Care: Full   Disposition: DC home    Total time I spent in care of this patient today (excluding time spent on other billable services) was 55 minutes. This time does not overlap with other providers involved in this patient's care.      Report Electronically Signed by:     Aram Beecham, PA-C  Physician [place] APP   Date: 06/15/2022 Time: 12:10

## 2022-06-15 NOTE — Progress Notes (Signed)
Brief GI Update    Plan for EGD tomorrow under moderate sedation in GI lab. OK to have regular diet now , NPO after MN.     Jonn Chaikin A. Laurina Bustle, MD  PGY-5, GI/Hepatology Fellow

## 2022-06-15 NOTE — Care Plan (Signed)
Problem: Adult Inpatient Plan of Care  Goal: Plan of Care Review  Outcome: Ongoing, Progressing  Flowsheets (Taken 06/15/2022 0256)  Plan of Care Reviewed With: patient  Progress: no change  Outcome Evaluation: Pt a/ox4 and VSS.  No pain complaints and ambulatory with stand-by-assist.  NPO since midnight for tentative EGD today.  Safety mintained

## 2022-06-15 NOTE — Nurse Assessment (Signed)
Called T8 and give report to [place], [place]

## 2022-06-16 ENCOUNTER — Encounter: Payer: Self-pay | Admitting: Internal Medicine

## 2022-06-16 ENCOUNTER — Inpatient Hospital Stay (HOSPITAL_BASED_OUTPATIENT_CLINIC_OR_DEPARTMENT_OTHER): Payer: Medicare Other

## 2022-06-16 ENCOUNTER — Ambulatory Visit: Payer: Medicare Other | Admitting: Neurological Surgery

## 2022-06-16 DIAGNOSIS — D509 Iron deficiency anemia, unspecified: Secondary | ICD-10-CM

## 2022-06-16 LAB — CBC WITH DIFFERENTIAL
Basophils % Auto: 0.8 %
Basophils Abs Auto: 0.1 10*3/uL (ref 0.0–0.2)
Eosinophils % Auto: 2.2 %
Eosinophils Abs Auto: 0.1 10*3/uL (ref 0.0–0.5)
Hematocrit: 32.2 % — ABNORMAL LOW (ref 36.0–46.0)
Hemoglobin: 10.2 g/dL — ABNORMAL LOW (ref 12.0–16.0)
Lymphocytes % Auto: 24 %
Lymphocytes Abs Auto: 1.4 10*3/uL (ref 1.0–4.8)
MCH: 23.6 pg — ABNORMAL LOW (ref 27.0–33.0)
MCHC: 31.7 % — ABNORMAL LOW (ref 32.0–36.0)
MCV: 74.4 fL — ABNORMAL LOW (ref 80.0–100.0)
MPV: 7.6 fL (ref 6.8–10.0)
Monocytes % Auto: 10 %
Monocytes Abs Auto: 0.6 10*3/uL (ref 0.1–0.8)
Neutrophils % Auto: 63 %
Neutrophils Abs Auto: 3.8 10*3/uL (ref 1.8–7.7)
Platelet Count: 219 10*3/uL (ref 130–400)
RDW: 21.1 % — ABNORMAL HIGH (ref 0.0–14.7)
Red Blood Cell Count: 4.33 10*6/uL (ref 4.00–5.20)
White Blood Cell Count: 6 10*3/uL (ref 4.5–11.0)

## 2022-06-16 LAB — BLD GAS VENOUS
Base Excess, Ven: 2 mEq/L (ref <=2)
HCO3, Ven: 28 mEq/L (ref 20–28)
O2 Sat, Ven: 58 % — ABNORMAL LOW (ref 70–100)
PCO2, Ven: 47 mm Hg (ref 35–50)
PO2, Ven: 33 mm Hg (ref 30–55)
pH, VEN: 7.38 (ref 7.30–7.40)

## 2022-06-16 LAB — BASIC METABOLIC PANEL
Anion Gap: 10 mmol/L (ref 7–15)
Calcium: 9.3 mg/dL (ref 8.6–10.0)
Carbon Dioxide Total: 25 mmol/L (ref 22–29)
Chloride: 105 mmol/L (ref 98–107)
Creatinine Serum: 1.11 mg/dL (ref 0.51–1.17)
E-GFR Creatinine (Female): 52 mL/min/{1.73_m2}
Glucose: 122 mg/dL — ABNORMAL HIGH (ref 74–109)
Potassium: 4.3 mmol/L (ref 3.4–5.1)
Sodium: 140 mmol/L (ref 136–145)
Urea Nitrogen, Blood (BUN): 17 mg/dL (ref 6–20)

## 2022-06-16 LAB — POC GLUCOSE
POC GLUCOSE: 137 mg/dL — ABNORMAL HIGH (ref 70–99)
POC GLUCOSE: 131 mg/dL — ABNORMAL HIGH (ref 70–99)
POC GLUCOSE: 122 mg/dL — ABNORMAL HIGH (ref 70–99)
POC GLUCOSE: 153 mg/dL — ABNORMAL HIGH (ref 70–99)

## 2022-06-16 LAB — LACTIC ACID, WHOLE BLD VENOUS: Lactic Acid, Whole Bld Venous: 0.8 mmol/L — ABNORMAL LOW (ref 0.9–1.7)

## 2022-06-16 MED ORDER — LIDOCAINE HCL 2 % MUCOSAL SOLUTION
5.0000 mL | Freq: Once | Status: AC
Start: 2022-06-16 — End: 2022-06-16
  Administered 2022-06-16: 5 mL via OROMUCOSAL
  Filled 2022-06-16: qty 15

## 2022-06-16 MED ORDER — MIDAZOLAM 1 MG/ML INJECTION SOLUTION
0.5000 mg | INTRAMUSCULAR | Status: DC | PRN
Start: 2022-06-16 — End: 2022-06-16
  Administered 2022-06-16: 6 mg via INTRAVENOUS
  Filled 2022-06-16: qty 10

## 2022-06-16 MED ORDER — SIMETHICONE 40 MG/0.6 ML ORAL DROPS,SUSPENSION
40.0000 mg | Freq: Once | ORAL | Status: AC
Start: 2022-06-16 — End: 2022-06-16
  Administered 2022-06-16: 40 mg
  Filled 2022-06-16: qty 0.6

## 2022-06-16 MED ORDER — ONDANSETRON HCL (PF) 4 MG/2 ML INJECTION SOLUTION
4.0000 mg | INTRAMUSCULAR | Status: DC | PRN
Start: 2022-06-16 — End: 2022-06-16
  Administered 2022-06-16: 4 mg via INTRAVENOUS
  Filled 2022-06-16: qty 2

## 2022-06-16 MED ORDER — EPINEPHRINE 0.1 MG/ML INJECTION SYRINGE
0.3000 mg | INJECTION | INTRAMUSCULAR | Status: DC | PRN
Start: 2022-06-16 — End: 2022-06-16
  Filled 2022-06-16: qty 10

## 2022-06-16 MED ORDER — FLUMAZENIL 0.1 MG/ML INTRAVENOUS SOLUTION
0.2000 mg | INTRAVENOUS | Status: DC | PRN
Start: 2022-06-16 — End: 2022-06-16
  Filled 2022-06-16: qty 5

## 2022-06-16 MED ORDER — OXYCODONE 5 MG TABLET
5.0000 mg | ORAL_TABLET | Freq: Four times a day (QID) | ORAL | Status: DC | PRN
Start: 2022-06-16 — End: 2022-06-18
  Administered 2022-06-16 – 2022-06-18 (×5): 5 mg via ORAL
  Filled 2022-06-16 (×6): qty 1

## 2022-06-16 MED ORDER — EPINEPHRINE 0.1 MG/ML INJECTION SYRINGE
0.3000 mg | INJECTION | INTRAMUSCULAR | Status: DC | PRN
Start: 2022-06-16 — End: 2022-06-16

## 2022-06-16 MED ORDER — LACTATED RINGERS IV INFUSION
INTRAVENOUS | Status: DC
Start: 2022-06-16 — End: 2022-06-16

## 2022-06-16 MED ORDER — ATROPINE 0.1 MG/ML INJECTION SYRINGE
0.5000 mg | INJECTION | INTRAMUSCULAR | Status: DC | PRN
Start: 2022-06-16 — End: 2022-06-16

## 2022-06-16 MED ORDER — FENTANYL (PF) 50 MCG/ML INJECTION SOLUTION
12.5000 ug | INTRAMUSCULAR | Status: DC | PRN
Start: 2022-06-16 — End: 2022-06-16
  Administered 2022-06-16: 100 ug via INTRAVENOUS
  Filled 2022-06-16: qty 4

## 2022-06-16 MED ORDER — DIPHENHYDRAMINE 50 MG/ML INJECTION SOLUTION
12.5000 mg | INTRAMUSCULAR | Status: DC | PRN
Start: 2022-06-16 — End: 2022-06-16

## 2022-06-16 MED ORDER — SIMETHICONE 80 MG CHEWABLE TABLET
80.0000 mg | CHEWABLE_TABLET | Freq: Four times a day (QID) | ORAL | Status: DC | PRN
Start: 2022-06-16 — End: 2022-06-18

## 2022-06-16 MED ORDER — EPINEPHRINE 0.1 MG/ML INJECTION SYRINGE
1.0000 mg | INJECTION | INTRAMUSCULAR | Status: DC | PRN
Start: 2022-06-16 — End: 2022-06-16

## 2022-06-16 MED ORDER — NALOXONE 0.4 MG/ML INJECTION SOLUTION
0.4000 mg | INTRAMUSCULAR | Status: DC | PRN
Start: 2022-06-16 — End: 2022-06-16
  Filled 2022-06-16: qty 1

## 2022-06-16 MED ORDER — SIMETHICONE 80 MG CHEWABLE TABLET
80.0000 mg | CHEWABLE_TABLET | Freq: Once | ORAL | Status: AC
Start: 2022-06-16 — End: 2022-06-16
  Administered 2022-06-16: 80 mg via ORAL
  Filled 2022-06-16: qty 1

## 2022-06-16 NOTE — Progress Notes (Incomplete)
GASTROENTEROLOGY/HEPATOLOGY PRE-PROCEDURE HISTORY & PHYSICAL  Note Started: 06/16/2022,16:35  Date of Service: 06/16/2022     Referring Provider:  Assunta Curtis  Procedure:   GI PREPROCEDURE [id number]    Attending:***  Assistant Surgeon: None ***    SUBJECTIVE  HPI: Paula Daugherty is a 77yrold female w/ history of *** who presents for ***. Currently, *** feeling well, no acute symptoms.     *** No blood thinners. *** Denies family history of colon cancer.     ***  Hemoglobin (g/dL)   Date Value   06/16/2022 9.7   08/30/2015 14.4     Platelet Count   Date Value Ref Range Status   06/16/2022 211 130 - 400 K/MM3 Final   08/30/2015 217 130 - 400 K/MM3 Final     INR   Date Value Ref Range Status   06/13/2022 1.13 0.87 - 1.18 Final     Comment:     This test was developed and its performance characteristics [facility] It has not been cleared by or approved by the FDA. The laboratory is regulated under CLIA as qualified to perform high-complexity testing. This test is used for clinical purposes. It should not be regarded as investigational or for research.   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[facility] 04/17/2016    Psychiatric illness        Social History:***  Social History     Socioeconomic History    Marital status: MARRIED    Number of children: 1   Occupational History    Occupation: PAdministrator, 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    ROS:    10 point ROS was obtained and is negative except as mentioned in the HPI.     I did review all available medical, surgical, personal/social history.    Physical Exam:   General Appearance:  No acute distress***.   Eyes: conjunctivae and corneas sclerae normal. ***  Mouth:  Oropharynx Brief Exam:10012.   Neck: Neck supple. normal size. ***  Lungs:***Breathing without difficult or use of accessory muscles  Abdomen: ***Abdomen soft, non-tender.  No masses or organomegaly.   Extremities: ***no cyanosis, clubbing  Skin:  SKIN [id number].     Labs/Imaging:  Lab Results   Lab Name Value Date/Time    WBC 6.7 06/16/2022 05:13 AM    WBC 8.9 08/30/2015 08:35 AM    HGB 9.7 (L) 06/16/2022 05:13 AM    HGB 14.4 08/30/2015 08:35 AM    HCT 30.3 (L) 06/16/2022 05:13 AM    HCT 44.6 08/30/2015 08:35 AM    PLT 211 06/16/2022 05:13 AM    PLT 217 08/30/2015 08:35 AM       Performed at [facility], reviewed:  See computer database.  Performed outside of [place] (reviewed if applicable):  N/A    Impression:   GI PREPROCEDURE PROCEDURES:314339    Per patient, may discuss bx/procedure results with self*** and/or leave message on voicemail at home***.    Patient is a candidate for  ANESTHESIA:314651.  Patient is ASA status:   ASA Status REQUIRED:11046    Airway Assessment:    Mallampati image    Mallampati Class:  1, Oral Eval: Mouth opening normal, Neck ROM:  full, Thyroid-mentum distance in fingerbreaths:  3.    The procedure, risks, benefits, and alternatives were explained.  All patient questions were answered.  The informed consent was signed.    Patient barriers to learning:  None    Patient/family understanding:  Verbalizes***    Report Electronically Signed by: Jenetta Loges, MD

## 2022-06-16 NOTE — Clinical Case Management (Signed)
Clinical Case Management Documentation    Name: Paula Daugherty  MRN: [id number]   Date of Birth: 23-Apr-1946 (89yr Gender: female    Note Date: 06/16/2022 Note Time: 12:10     Current Isolation(s): No active isolations  Current Infection(s): No active infections      INITIAL ASSESSMENT NOTE    Patient was transferred from Networked reference to record EAF .  Takeback Letter: Not Applicable    [place] Address[place] [address] [id number] Discharge Address: Verified    Patient can follow-up with:   PCP: WNino Glow MD  / Phone Number: [phone] [facility] [address] [facility] [phone]   Funding/Billing: Payor: MEDICARE / Plan: MEDICARE PART A&B / Product Type: *No Product type* /    Secondary Insurance: N/A  Reason for admission: gets PRBC and iron infusions as outpatient ,and last PRBC on 05/16/2022. after repeat lab work, critically low hgb of 5.8 and  hct 19.2  Dr. CVirgel Bouquetasked patient  to come to the ED.    Patient able to participate in plan?: Yes  Is an interpreter used?: No  Living arrangements: Spouse/Significant other  Type of Residence: private residence  Home Environment (floors/stairs): Single story no steps to enter  Who will be the primary caregiver after discharge?  Phone #? : Spouse  If 24 hr. supervision is required, who will be the additional support person(s)?  Phone #?: [place]  Is caregiver willing/able to assist this patient with daily activities? (feeding, transferring in/out of bed/chair, dressing, bathing & other daily activities?: Yes  Is caregiver willing/able to receive training to support this patient for above activities?: Yes  Developmental Level Appropriate (Pediatrics): N/A  CCS (Pediatrics): N/A  Pre-Hospitalization self-care deficits: None  Pre-Hospitalization mobility: Independent  Bladder function: Continent  Bowel function: Continent  Pre-Hospital Services:  None  Type of home health care services in place: None  Anticipated Disposition: Home  DME in place: None  DME vendor: N/A  Patient's goal upon discharge (in patient's own words) : Home with spouse  Does the patient have ongoing DC Planning needs?: No  Is the patient ready for discharge today?: No     No post-discharge plans have been finalized at this time.    Comments: Patient seen at bedside.  Above info verified. / Screened for discharge planning.    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  Patient has been experiencing progressive fatigue,  chest pain, and dyspnea on exertion in the recent days to weeks. Of note, patient follows with Dr. EGlyn Adeand had a last appointment on 04/30/2022 with plans for repeat EGD on 06/20/2022. Patient receives PRBC and iron infusions as outpatient and last PRBC transfusion was on 05/16/2022. Today when she had repeat lab work, critically low hgb of 5.8 and  hct 19.2 was noted and patient was asked by Dr. CVirgel Bouquetto come to the ED. GI was consulted and will evaluate patient for any endoscopic procedures.      Patient lives with spouse in single level home with no steps.  At baseline, patient is independent with mobility with no use of assistive devices. Patient is independent with basic ADL's. No other DME/services/linkage with outside entities to speak of.    Patient will be transported by Spouse via private car on day of discharge.    No discharge needs identified at this time and will re-evaluate if alerted to change in condition or if referral is received prior to discharge.    Follow-up/recommendations:   [ ]$   Discharge planning will continue to follow patient and assist with developing d/c plan as patient stabilizes and discharge needs are more clearly identified.     Date/Time: 06/16/2022 12:10  Electronically Signed by:   Tacy Dura, Case Manager

## 2022-06-16 NOTE — Nurse Procedure (Signed)
NURSING TRANSFER NOTE     Note started: 06/16/2022, 17:02  GI Procedure/s done: EGD, performed by Dr(s): Ko  and Pecha  Medications given for procedure: Versed 6 mg and Fentanyl 100 mcg.    Complications: none  Total IVF: 200 ml LR  What to watch for: fever, abdominal pain, chest pain, any significant bleeding. Please encourage patient to pass gas/belch.   See MD procedural note for details and findings.   Report was given to:  Barnett Applebaum, RN and GI Recovery Room RN.    Selena Lesser, RN

## 2022-06-16 NOTE — Nurse Assessment (Signed)
ASSESSMENT NOTE    Note Started: 06/16/2022, 07:24     Initial assessment completed and recorded in EMR.  Report received from night shift nurse and orders reviewed. Plan of Care reviewed and appropriate, discussed with patient.  Call light within reach and emergency equipment at bedside. No acute signs of distress observed. Kamira Mellette Camillia Herter, RN

## 2022-06-16 NOTE — Progress Notes (Signed)
INTERNAL MEDICINE DAILY PROGRESS NOTE    Date: 06/16/2022 Time: 12:22    HANDOFF SUMMARY  GI bleed, melena, anemia (Hgb 6):GI consulted and concerned for possible high risk lesion, would like admission with urgent EGD in AM and likely monitoring throughout the weekend. 2 units PRBCs, protonix ordered.        CHIEF COMPLAINT  Low hemoglobin (5.8) on outpt labs.     INTERVAL EVENTS  - no acute events overnight  - EGD tentatively scheduled for today    SUBJECTIVE  Pt seen and examined, ambulating around the room, states that she is going OK.  Offers no new complaints.  No chest pain, no SOB, no N/V        Medications  Atorvastatin (LIPITOR) Tablet 10 mg, ORAL, Daily Bedtime  [Held by provider] Empagliflozin (JARDIANCE) Tablet 25 mg, ORAL, QAM  [Held by provider] Insulin Aspart (NOVOLOG) Injection Pen 1-20 Units, SUBCUTANEOUS, Q4H (0200-2200) - Insulin  Insulin Aspart (NOVOLOG) Injection Pen 1-20 Units, SUBCUTANEOUS, TID w/ meals  Pantoprazole (PROTONIX) Injection 40 mg, IV, Q12H Now        Acetaminophen (TYLENOL) Tablet 650 mg, ORAL, Q4H PRN  Bisacodyl (DULCOLAX) EC Tablet 10 mg, ORAL, Q24H PRN  Camphor/Menthol (SARNA) Lotion, TOPICAL, TID PRN  Dextrose 10% 10-20 g IVPB 100-200 mL, IV, PRN  Diphenhydramine-Zinc Acetate (BENADRYL ITCH-RELIEF) 1-0.1 % Cream, TOPICAL, TID PRN  Glucagon Injection 1 mg, IM, PRN  Glucose Chewable Tablet 12-24 g, ORAL, PRN  Melatonin Tablet 3 mg, ORAL, Bedtime PRN  Ondansetron (ZOFRAN) Injection 4 mg, IV, Q8H PRN  Prochlorperazine (COMPAZINE) Injection 5-10 mg, IV, Q6H PRN  Trazodone (DESYREL) Tablet 25 mg, ORAL, Bedtime PRN        NaCl 0.9%, , IV, CONTINUOUS        OBJECTIVE    Vitals Signs  Current Vitals  Temp: 36.7 C (98.1 F)  BP: 130/78  Pulse: 98  Resp: 18  SpO2: 98 %     Weight: 72.6 kg (160 lb)    Intake and Output  Last Two Completed Shifts  In: 1040 [Oral:1040]  Out: -     Current Shift  No intake/output data recorded.      General Appearance: Awake and alert, pleasant and  cooperative, answers questions appropriately, appears comfortable  Cardiovascular: Regular rate and rhythm, no murmurs, rubs or gallops  Lungs: clear breath sounds B/L.  No tachypnea.    Abdomen: Soft, nondistended, no tenderness to palpation  Back:  No spinal or paraspinal tenderness to palpation.    Extremities: Warm and perfused, no edema, palpable peripheral pulses  Neuro: Symmetrical strength in the upper and lower extremities  Mental Status: No evidence of altered sensorium     Lines and Drains     Peripheral IV 06/13/22 20 g over-the-needle catheter Right Antecubital (Active)       Peripheral IV 06/15/22 20 g Anterior;Left Forearm (Active)        Labs and Studies  I have reviewed the following information from the last 24 hours: allied health and treating physician notes, imaging, labs and microbiology data, cardiac studies, and telemetry data (if on monitor)    ASSESSMENT AND PLAN  Panagiota Faul is a 77 year old female with PMH significant for HTN, Anxiety, asthma, depression, NASH, liver fibrosis, DM II, gastroparesis, 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  Patient has been experiencing progressive fatigue, chest pain, and dyspnea on exertion in the recent days to weeks.  Hgb noted to be 5.8 on outpt labs and 6.1 in the ED.      # Acute blood loss anemia r/o GI bleed  # BRBPR  # h/o Neuroendocrine tumor of the duodenum, diagnosed 02/2016  Patient presented with hgb 5.8 on outpt labs.  Patient had an EGD on 04/05/2021 which was normal with no endoscopic evidence of neoplasm or no mention of any bleeding. Pathology for duodenal mucosa was negative for malignancy. Colonoscopy on 04/05/2021 noted benign appearing polyps, colonic diverticulosis, and external hemorrhoids. Pathology for colonic  polyp was positive for tubular adenoma.  S/p 2 unit pRBC and Protonix 80 mg x 1  - GI consulted and EGD tentatively planned for today  - continue Protonix 40 mg IVP q12h  - keep NPO P MN  - continue to trend CBC every 12 hrs       # Chest pain   Patient reports chest pain for past several days to weeks. Accompanied by progressive fatigue and exertional dyspnea. Low yield for ACS suspect demand ischemia.  EKG with no ST elevation or T wave inversion  - Troponin 9, BNP 122  - no chest pain at this time       # HTN  Home regimen include Losartan 25 mg daily, Amlodipine 10 mg daily.  BP stable in 120s/80s  - hold home antihypertensives for now given anemia     # DM Type 2  # Gastroparesis  Home regimen includes Metformin 1000 mg BID, Empagliflozin 25 mg daily   - hold home Empagliflozin 25 mg daily  - sensitive novolog ISS     # Hyperlipidemia  - Resume Atorvastatin 10 mg qhs      # Anxiety   # Depression  - stable. Patient does not endorse any such feelings.     FEN: NPO, AFTER MIDNIGHT  SPECIAL TRAY REQUEST  FOOD PREFERENCES (NON-CONSULT)  SPECIAL TRAY REQUEST  SPECIAL TRAY REQUEST. LBM: Last Bowel Movement: 06/16/22 (06/16/22 0800)  DVT Prophylaxis: VTE prophylaxis contraindicated 2/2 GIB  Advanced Care Planning/Goals of Care: Full   Disposition: DC home    Total time I spent in care of this patient today (excluding time spent on other billable services) was 55 minutes. This time does not overlap with other providers involved in this patient's care.      Report Electronically Signed by:     Aram Beecham, PA-C  Physician [place] APP   Date: 06/16/2022 Time: 12:22

## 2022-06-16 NOTE — Telephone Encounter (Signed)
General Advice / Message to MD:    Patient returning phone call to confirm she and husband will keep appointments scheduled for 06/24/22 with Dr. Joya Gaskins. Patient currently at [facility] since Friday 06/13/22 waiting to have a Endoscopy procedure. Has had blood transfusions due to low hemoglobin. If for any reason she not able to keep this appointment scheduled she will call to reschedule.     [place]

## 2022-06-16 NOTE — Care Plan (Signed)
Problem: Adult Inpatient Plan of Care  Goal: Plan of Care Review  Outcome: Ongoing, Progressing  Flowsheets (Taken 06/16/2022 0615)  Plan of Care Reviewed With: patient  Outcome Evaluation: Patient stable. NPO since midnight for EGD today. Denies pain. Slept well between care. NAD.

## 2022-06-16 NOTE — Nurse Assessment (Signed)
ASSESSMENT NOTE    Note Started: 06/16/2022, 19:30     Initial assessment completed and recorded in EMR.  Report received from day shift nurse and orders reviewed. Plan of Care reviewed and appropriate, discussed with patient.  Karna Christmas, RN  Pt alert and oriented, in pain 9/10 at abdomen, oxycodone given at [place] by day shift RN. Vomited 165m at 1924. Stimuli reduced, deep breathing encouraged, symptoms relieved.

## 2022-06-17 ENCOUNTER — Telehealth: Payer: Self-pay | Admitting: GASTROENTEROLOGY

## 2022-06-17 DIAGNOSIS — D500 Iron deficiency anemia secondary to blood loss (chronic): Secondary | ICD-10-CM

## 2022-06-17 DIAGNOSIS — K31811 Angiodysplasia of stomach and duodenum with bleeding: Secondary | ICD-10-CM

## 2022-06-17 LAB — COMPREHENSIVE METABOLIC PANEL
Alanine Transferase (ALT): 23 U/L (ref <=33)
Albumin: 3.8 g/dL — ABNORMAL LOW (ref 4.0–4.9)
Alkaline Phosphatase (ALP): 84 U/L (ref 35–129)
Anion Gap: 11 mmol/L (ref 7–15)
Aspartate Transaminase (AST): 28 U/L (ref <=41)
Bilirubin Total: 1.4 mg/dL — ABNORMAL HIGH (ref <=1.2)
Calcium: 8.7 mg/dL (ref 8.6–10.0)
Carbon Dioxide Total: 23 mmol/L (ref 22–29)
Chloride: 103 mmol/L (ref 98–107)
Creatinine Serum: 1.06 mg/dL (ref 0.51–1.17)
E-GFR Creatinine (Female): 55 mL/min/1.73m*2
Glucose: 185 mg/dL — ABNORMAL HIGH (ref 74–109)
Potassium: 3.7 mmol/L (ref 3.4–5.1)
Protein: 6.7 g/dL (ref 6.6–8.7)
Sodium: 137 mmol/L (ref 136–145)
Urea Nitrogen, Blood (BUN): 17 mg/dL (ref 6–20)

## 2022-06-17 LAB — CBC WITH DIFFERENTIAL
Basophils % Auto: 0.7 %
Basophils Abs Auto: 0 10*3/uL (ref 0.0–0.2)
Eosinophils % Auto: 1.4 %
Eosinophils Abs Auto: 0.1 10*3/uL (ref 0.0–0.5)
Hematocrit: 29.6 % — ABNORMAL LOW (ref 36.0–46.0)
Hemoglobin: 9.5 g/dL — ABNORMAL LOW (ref 12.0–16.0)
Lymphocytes % Auto: 19.9 %
Lymphocytes Abs Auto: 1.4 10*3/uL (ref 1.0–4.8)
MCH: 23.7 pg — ABNORMAL LOW (ref 27.0–33.0)
MCHC: 32 % (ref 32.0–36.0)
MCV: 73.9 fL — ABNORMAL LOW (ref 80.0–100.0)
MPV: 7.4 fL (ref 6.8–10.0)
Monocytes % Auto: 7.1 %
Monocytes Abs Auto: 0.5 10*3/uL (ref 0.1–0.8)
Neutrophils % Auto: 70.9 %
Neutrophils Abs Auto: 4.9 10*3/uL (ref 1.8–7.7)
Platelet Count: 212 10*3/uL (ref 130–400)
RDW: 21.6 % — ABNORMAL HIGH (ref 0.0–14.7)
Red Blood Cell Count: 4.01 10*6/uL (ref 4.00–5.20)
White Blood Cell Count: 6.9 10*3/uL (ref 4.5–11.0)

## 2022-06-17 LAB — LIPASE: Lipase: 56 U/L (ref 13–60)

## 2022-06-17 LAB — POC GLUCOSE
POC GLUCOSE: 128 mg/dL — ABNORMAL HIGH (ref 70–99)
POC GLUCOSE: 168 mg/dL — ABNORMAL HIGH (ref 70–99)
POC GLUCOSE: 154 mg/dL — ABNORMAL HIGH (ref 70–99)
POC GLUCOSE: 161 mg/dL — ABNORMAL HIGH (ref 70–99)

## 2022-06-17 MED ORDER — PANTOPRAZOLE 40 MG TABLET,DELAYED RELEASE
40.0000 mg | DELAYED_RELEASE_TABLET | Freq: Every day | ORAL | Status: DC
Start: 2022-06-18 — End: 2022-06-18
  Administered 2022-06-18: 40 mg via ORAL
  Filled 2022-06-17: qty 1

## 2022-06-17 MED ORDER — FERROUS GLUCONATE 324 MG (38 MG IRON) TABLET
324.0000 mg | ORAL_TABLET | ORAL | Status: DC
Start: 2022-06-18 — End: 2022-06-18
  Filled 2022-06-17: qty 1

## 2022-06-17 NOTE — Telephone Encounter (Signed)
Called patient to schedule follow up appointment at the hepatology clinic with Dr. Virgel Bouquet or NP.  Spoke with patient . Patient will call back to schedule an appointment.     Lance Morin, [place]

## 2022-06-17 NOTE — Care Plan (Signed)
Problem: Adult Inpatient Plan of Care  Goal: Plan of Care Review  Outcome: Ongoing, Progressing  Flowsheets (Taken 06/17/2022 0008)  Plan of Care Reviewed With: patient  Progress: no change  Outcome Evaluation: AOx4. VSS on RA. C/o pain at abdomen, oxycodone given, pt able to sleep. Emesis 250 mL, zofran given. Safety rounds done, needs met. Slept between care.

## 2022-06-17 NOTE — Nurse Assessment (Signed)
ASSESSMENT NOTE    Note Started: 06/17/2022, 10:42     Initial assessment completed at 0800 and recorded in EMR.  Report received from night shift nurse and orders reviewed. Plan of Care reviewed and appropriate, discussed with patient.  Alba Cory, RN

## 2022-06-17 NOTE — Care Plan (Signed)
Problem: Adult Inpatient Plan of Care  Goal: Plan of Care Review  Outcome: Ongoing, Progressing  Flowsheets (Taken 06/17/2022 1903)  Plan of Care Reviewed With: patient  Progress: improving  Outcome Evaluation: VS stable. Medicated for back pain with Oxycodone. Medicated for nausea with Zofran. Tolerated regular diet. No emesis. Up ad lib. Call light in reach.    Alba Cory, RN

## 2022-06-17 NOTE — Nurse Assessment (Signed)
ASSESSMENT NOTE    Note Started: 06/17/2022, 19:56     Initial assessment completed and recorded in EMR.  Report received from day shift nurse and orders reviewed. Plan of Care reviewed and appropriate, discussed with patient.  Faythe Dingwall,  RN

## 2022-06-17 NOTE — Telephone Encounter (Signed)
Hi Dr.Chak,    Patient called and would like to know if she needs to schedule a follow up with you after she is discharged from the hospital?    Thank you,  Wonda Olds, LVN

## 2022-06-17 NOTE — Progress Notes (Signed)
INTERNAL MEDICINE DAILY PROGRESS NOTE    Date: 06/17/2022 Time: 13:21    HANDOFF SUMMARY  GI bleed, melena, anemia (Hgb 6):GI consulted and concerned for possible high risk lesion, would like admission with urgent EGD in AM and likely monitoring throughout the weekend. 2 units PRBCs, protonix ordered.        CHIEF COMPLAINT  Low hemoglobin (5.8) on outpt labs.     INTERVAL EVENTS  - no acute events overnight  - s/p EGD with GAVE, placed 6 bands     SUBJECTIVE  Pt seen and examined, had abdominal pain and nausea early this morning, not back to herself.  Tolerated clear liquid diet and will plan to advance diet as tolerated.  No chest pain, no SOB, no N/V        Medications  Atorvastatin (LIPITOR) Tablet 10 mg, ORAL, Daily Bedtime  [Held by provider] Empagliflozin (JARDIANCE) Tablet 25 mg, ORAL, QAM  Insulin Aspart (NOVOLOG) Injection Pen 1-20 Units, SUBCUTANEOUS, TID w/ meals  Pantoprazole (PROTONIX) Injection 40 mg, IV, Q12H Now        Acetaminophen (TYLENOL) Tablet 650 mg, ORAL, Q4H PRN  Bisacodyl (DULCOLAX) EC Tablet 10 mg, ORAL, Q24H PRN  Camphor/Menthol (SARNA) Lotion, TOPICAL, TID PRN  Dextrose 10% 10-20 g IVPB 100-200 mL, IV, PRN  Diphenhydramine-Zinc Acetate (BENADRYL ITCH-RELIEF) 1-0.1 % Cream, TOPICAL, TID PRN  Glucagon Injection 1 mg, IM, PRN  Glucose Chewable Tablet 12-24 g, ORAL, PRN  Melatonin Tablet 3 mg, ORAL, Bedtime PRN  Ondansetron (ZOFRAN) Injection 4 mg, IV, Q8H PRN  Oxycodone (ROXICODONE) Tablet 5 mg, ORAL, Q6H PRN  Prochlorperazine (COMPAZINE) Injection 5-10 mg, IV, Q6H PRN  Simethicone (GAS-X) Chewable Tablet 80 mg, ORAL, Q6H PRN  Trazodone (DESYREL) Tablet 25 mg, ORAL, Bedtime PRN        NaCl 0.9%, , IV, CONTINUOUS        OBJECTIVE    Vitals Signs  Current Vitals  Temp: 36.6 C (97.8 F)  BP: 134/63  Pulse: 75  Resp: 18  SpO2: 97 %  Flow (L/min): 4  Weight: 72.6 kg (160 lb)    Intake and Output  Last Two Completed Shifts  In: -   Out: 250 [Emesis:250]    Current Shift  In: 840  [Oral:840]  Out: -       General Appearance: Awake and alert, pleasant and cooperative, appears comfortable  Cardiovascular: Regular rate and rhythm, no murmurs, rubs or gallops  Lungs: clear breath sounds B/L.  No tachypnea.    Abdomen: Soft, nondistended, no tenderness to palpation  Back:  No spinal or paraspinal tenderness to palpation.    Extremities: Warm and perfused, no edema, palpable peripheral pulses  Neuro: Symmetrical strength in the upper and lower extremities  Mental Status: No evidence of altered sensorium     Lines and Drains     Peripheral IV 06/15/22 20 g Anterior;Left Forearm (Active)        Labs and Studies  I have reviewed the following information from the last 24 hours: allied health and treating physician notes, imaging, labs and microbiology data, cardiac studies, and telemetry data (if on monitor)    ASSESSMENT AND PLAN  Paula Daugherty is a 77 year old female with PMH significant for HTN, Anxiety, asthma, depression, NASH, liver fibrosis, DM II, gastroparesis, 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  Patient has been experiencing progressive fatigue, chest pain, and dyspnea on exertion in the recent days to weeks.  Hgb noted to be 5.8 on outpt labs and 6.1 in the ED.      # Acute blood loss anemia r/o GI bleed  # BRBPR  # h/o Neuroendocrine tumor of the duodenum, diagnosed 02/2016  # s/p EGD with GAVE finding s/p 6 band placement  Patient presented with hgb 5.8 on outpt labs.  Patient had an EGD on 04/05/2021 which was normal with no endoscopic evidence of neoplasm or no mention of any bleeding. Pathology for duodenal mucosa was negative for malignancy. Colonoscopy on 04/05/2021 noted benign appearing polyps, colonic diverticulosis, and external hemorrhoids. Pathology for  colonic polyp was positive for tubular adenoma.  S/p 2 unit pRBC and Protonix 80 mg x 1.  s/p EGD with GAVE, placed 6 bands   - GI recommended PPO PO daily x 4 weeks  - iron supplementation  - advance diet as tolerated  - pain control  - continue zofran PRN  - continue to trend CBC     # Chest pain   Patient reports chest pain for past several days to weeks. Accompanied by progressive fatigue and exertional dyspnea. Low yield for ACS suspect demand ischemia.  EKG with no ST elevation or T wave inversion  - Troponin 9, BNP 122  - no chest pain at this time       # HTN  Home regimen include Losartan 25 mg daily, Amlodipine 10 mg daily.  BP stable in 120s/80s  - hold home antihypertensives for now given anemia     # DM Type 2  # Gastroparesis  Home regimen includes Metformin 1000 mg BID, Empagliflozin 25 mg daily   - hold home Empagliflozin 25 mg daily  - sensitive novolog ISS     # Hyperlipidemia  - Resume Atorvastatin 10 mg qhs      # Anxiety   # Depression  - stable. Patient does not endorse any such feelings.     FEN: SPECIAL TRAY REQUEST  REGULAR DIET  SPECIAL TRAY REQUEST. LBM: Last Bowel Movement: 06/16/22 (06/16/22 0800)  DVT Prophylaxis: VTE prophylaxis contraindicated 2/2 GIB  Advanced Care Planning/Goals of Care: Full   Disposition: DC home    Total time I spent in care of this patient today (excluding time spent on other billable services) was 55 minutes. This time does not overlap with other providers involved in this patient's care.      Report Electronically Signed by:     Aram Beecham, PA-C  Physician [place] APP   Date: 06/17/2022 Time: 13:21

## 2022-06-17 NOTE — Utilization Review (Signed)
Upon review of patient's chart, noted that patient does not meet inpatient criteria per [facility]. Patient meets outpatient/observation status at this time. Care discussed with designated provider, Dr. Scharlene Corn, and UR Officer/Physician Advisor, Dr. Janeann Merl, who agree with outpatient/observation status. The designated provider agrees to change the order to outpatient/observation. UR letter has been given to the attending physician and [facility]. The UR letter explaining the change to outpatient/observation status has been provided to the patient by [facility] staff with confirmation email at 1040H.       Electronically Signed:      Dannielle Karvonen, RN, BSN  Clinical Case Management   Utilization Review   Phone: [phone]

## 2022-06-17 NOTE — Progress Notes (Signed)
Inpatient Gastroenterology Progress Note  Requesting Physician/Service: Dr. Scharlene Corn  Name:Paula Daugherty  MRN: [id number]  DOB: [birth year]     Reason for Consult: symptomatic anemia w/ suspected upper gi bleed          Assessment  37yrfemale with DM2, NASH with F3 fibrosis, gastroparesis, duodenal carcinoid tumor s/p EMR 2017 with neg PET and no recurrence on annual upper endoscopies, and iron-deficiency anemia requiring IV iron infusions and recently 1 prbc prior to admission with planned outpatient upper endoscopy 2/16.    Pt underwent EGD on 2/12 which showed GAVE, s.p. 6 bands with appropriate hemostasis. Post-procedure with some nausea, vomiting, and abdominal pain, though slowly improving as of this morning.     Recommendation(s):  [ ]$  ADAT at this point  [ ]$  continue anti-emetics per primary team for ongoing nausea, PRN ondansetron and compazine should be sufficient  [ ]$  PPI PO daily x4 weeks  [ ]$  CLD tonight, advance diet as tolerated tomorrow  [ ]$  iron supplementation per primary team  [ ]$  if tolerating PO and abdominal pain acceptable, ok for discharge from GI perspective.       Discussed with attending Dr. KLetta Moynahan We will continue to follow if patient still admitted tomorrow.     RDarden DatesMD  PGY4  Gastroenterology and Hepatology              Subjective   Subjective     Pt overall reporting some nausea, though tolerating some liquids this morning. Reporting epigastric abdominal pain, though this has improved with norco to about a 7/10. No bleeding noted.             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    She has no past medical history of Arthritis,  Balance problem, Bleeding disorder (HCC), COPD (chronic obstructive pulmonary disease) ([place], Crohn's disease ([place], Deep venous thrombosis ([place], Food intolerance, Gastrointestinal ulcer, H/O shortness of breath, Heart failure ([place], Human immunodeficiency virus disease ([place], Leukemia ([place], Malignant hyperthermia, Myocardial infarction ([place], Obstructive sleep apnea, PONV (postoperative nausea and vomiting), Pulmonary embolism ([place], Stroke ([place], Systemic lupus erythematosus arthritis (HP[place], Ulcerative colitis ([place], or Vascular disease.  has a past surgical history that includes pr ligate fallopian tube; pr total abdom hysterectomy (1980s); pr knee scope,diagnostic (1980s); pr repair tympanic membrane; colonoscopy (01/04/2014); mammoplasty, reduction (1985); phacoemulsification, cataract (Right, 02/12/2015); repair, blepharoptosis (Bilateral, 08/06/2015); pr esophagogastroduodenoscopy transoral diagnostic (02/12/2016); pr esophagogastroduodenoscopy transoral diagnostic (06/11/2016); egd (04/2020); pr esophagogastroduodenoscopy transoral diagnostic; pr esophagogastroduodenoscopy transoral diagnostic (04/2017); manometry (09/02/2017); biofeedback peri/uro/rectal (10/21/2017); biofeedback peri/uro/rectal (11/04/2017); biofeedback peri/uro/rectal (11/18/2017); biofeedback peri/uro/rectal (12/02/2017); colonoscopy (12/16/2017); hc colonoscopy,remv lesn,snare (12/16/2017); biofeedback peri/uro/rectal (12/30/2017); capsule endoscopy (01/27/2018); capsule endoscopy (01/29/2018); biofeedback peri/uro/rectal (03/23/2018); pr esophagogastroduodenoscopy transoral diagnostic (04/20/2018); hc biofeedback peri/uro/rectal (05/02/2018); egd (04/08/2019); egd (04/05/2021); colonoscopy (04/05/2021); and capsule endoscopy (12/23/2021).  family history includes Diabetes in her father; Heart in her mother; Non-contributory in her brother and brother. social history includes:  reports that she has quit smoking. Her smoking use  included cigarettes. She has a 2 pack-year smoking history. She has never been exposed to tobacco smoke. She has never used smokeless tobacco. She reports current alcohol use. She reports that she does not use drugs.     Home Medications:  Albuterol (PROAIR HFA, PROVENTIL HFA, VENTOLIN HFA) 90 mcg/actuation inhaler    Amlodipine (NORVASC) 10 mg Tablet    Atorvastatin (LIPITOR) 10 mg Tablet    Blood Glucose Meter (ONETOUCH ULTRA2 METER) Kit    CloNIDine (CATAPRES) 0.1 mg Tablet    Empagliflozin (JARDIANCE) 25 mg Tablet    Losartan (COZAAR) 25 mg Tablet    Metformin (GLUCOPHAGE) 1,000 mg tablet    Metoclopramide (REGLAN) 5 mg Tablet    NYSTATIN 100,000 unit/gram Powder    Omeprazole (PRILOSEC) 20 mg Delayed Release Capsule    Ondansetron (ZOFRAN-ODT) 8 mg disintegrating tablet   Current Medications:   Acetaminophen (TYLENOL) Tablet 650 mg    Atorvastatin (LIPITOR) Tablet 10 mg    Bisacodyl (DULCOLAX) EC Tablet 10 mg    Camphor/Menthol (SARNA) Lotion    Dextrose 10% 10-20 g IVPB 100-200 mL    Diphenhydramine-Zinc Acetate (BENADRYL ITCH-RELIEF) 1-0.1 % Cream    [Held by provider] Empagliflozin (JARDIANCE) Tablet 25 mg    [START ON 06/18/2022] Ferrous Gluconate Tablet 324 mg    Glucagon Injection 1 mg    Glucose Chewable Tablet 12-24 g    Insulin Aspart (NOVOLOG) Injection Pen 1-20 Units    Melatonin Tablet 3 mg    NaCl 0.9% Infusion    Ondansetron (ZOFRAN) Injection 4 mg    Oxycodone (ROXICODONE) Tablet 5 mg    [START ON 06/18/2022] Pantoprazole (PROTONIX) Delayed Release Tablet 40 mg    Prochlorperazine (COMPAZINE) Injection 5-10 mg    Simethicone (GAS-X) Chewable Tablet 80 mg    Trazodone (DESYREL) Tablet 25 mg     Allergies: Sulfa (sulfonamide antibiotics), Contrast dye [radiopaque agent], Hydrochlorothiazide, Januvia [sitagliptin], Keflex [cephalexin], Lyrica [pregabalin], Omnipaque [iohexol], Prozac [fluoxetine hcl], and Topiramate         Physical Exam     Gen: Awake, resting comfortably in bed, nad  HEENT: EOMI,  no scleral icterus  Neck: supple, trachea midline  CV: RRR, no LE edema  Resp: equal chest expansion on room air, no wheeze  GI: abdomen is soft, nd, mildly ttp in epigastric region  MSK: moves all 4 extremities, no LE edema  Neuro: AOX3, no focal deficits       Procedural Planning:   Patient is a candidate for moderate sedation.  Patient is ASA status:  3 - Moderate to severe systemic disease that limits activity but not incapacitating    Airway Assessment:    Mallampati image    Mallampati Class:  2, Oral Eval: Mouth opening normal, Neck ROM:  full, Thyroid-mentum distance in fingerbreaths: 2.    Patient/family understanding:  verbalizesThe procedure, risks, benefits, and alternatives were explained.  All patient questions were answered.  The informed consent was signed.    Patient barriers to learning:  none     Weight: 72.6 kg (160 lb) Body mass index is 29.26 kg/m.Body surface area is 1.78 meters squared.  24 Hour Range Current   BP  Min: 126/69  Max: 148/73   126/69   Temp  Min: 36.3 C (97.4 F)  Max: 37.1 C (98.7 F)  36.7 C (98 F)   Pulse  Min: 69  Max: 75  74   Resp  Min: 14  Max: 18  18   SpO2  Min: 97 %  Max: 98 %  97 %           06/17/22  0715   WBC 6.9   HGB 9.5*   HCT 29.6*   PLT 212  06/17/22  1116   GLU 185*   BUN 17   CR 1.06   NA 137   K 3.7   CL 103   CO2 23   CA 8.7   LIP 56            06/17/22  1116   TP 6.7   ALB 3.8*   TBIL 1.4*   ALP 84   AST 28   ALT 23    No results for input(s): "FRTN", "FE", "TIBC", "IRONSAT" in the last 24 hours.       Imaging Studies:   Reviewed prior    Procedures:  Reviewed           Jenetta Loges, MD  *PHYSICIAN: FELLOW  06/14/2022 10:32

## 2022-06-18 ENCOUNTER — Other Ambulatory Visit: Payer: Self-pay

## 2022-06-18 LAB — POC GLUCOSE
POC GLUCOSE: 140 mg/dL — ABNORMAL HIGH (ref 70–99)
POC GLUCOSE: 162 mg/dL — ABNORMAL HIGH (ref 70–99)

## 2022-06-18 MED ORDER — FERROUS GLUCONATE 324 MG (38 MG IRON) TABLET
324.0000 mg | ORAL_TABLET | ORAL | 0 refills | Status: AC
Start: 2022-06-18 — End: 2022-07-18
  Filled 2022-06-18: qty 12, 28d supply, fill #0

## 2022-06-18 MED ORDER — PANTOPRAZOLE 40 MG TABLET,DELAYED RELEASE
40.0000 mg | DELAYED_RELEASE_TABLET | Freq: Every day | ORAL | 0 refills | Status: DC
Start: 2022-06-19 — End: 2022-09-10
  Filled 2022-06-18: qty 30, 30d supply, fill #0

## 2022-06-18 MED ORDER — OXYCODONE 5 MG TABLET
5.0000 mg | ORAL_TABLET | Freq: Four times a day (QID) | ORAL | 0 refills | Status: AC | PRN
Start: 2022-06-18 — End: 2022-06-21
  Filled 2022-06-18: qty 5, 2d supply, fill #0

## 2022-06-18 NOTE — Nurse Assessment (Signed)
ASSESSMENT NOTE    Note Started: 06/18/2022, 12:38     Initial assessment completed and recorded in EMR. Patient was found AOx4, VSS, lungs clear to diminished at bases, pain is tolerated well at 5/10 level. Patient is eager to go home today. Plan of care has been discussed and understanding was verbalized. Call light within reach and bed is in low position. Will continue to monitor and assist. Harvel Quale, RN.

## 2022-06-18 NOTE — Nurse Focus (Signed)
ASSESSMENT NOTE    Note Started: 06/18/2022, 07:28     Report received from night shift nurse and orders reviewed. Plan of Care reviewed and appropriate, discussed with patient.  Harvel Quale, RN RN

## 2022-06-18 NOTE — Care Plan (Signed)
Problem: Adult Inpatient Plan of Care  Goal: Plan of Care Review  Outcome: Met  Flowsheets (Taken 06/18/2022 1416)  Outcome Evaluation: patien tremained stable for the shift. VSS, pain is well controlled with available pain medications. discharge instructions were given to [place] her husband and understanding was verbalized by both. all the belongings are with paitent. husband is going downstairs to pick up medications and bring the car tot [employer]. care plan resolved. awaiting patient escort. Harvel Quale, RN.  Goal: Absence of Hospital-Acquired Illness or Injury  Outcome: Met  Goal: Optimal Comfort and Wellbeing  Outcome: Met  Goal: Readiness for Transition of Care  Outcome: Met     Problem: Fall Injury Risk  Goal: Absence of Fall and Fall-Related Injury  Outcome: Met     Problem: Pain Acute  Goal: Acceptable Pain Control and Functional Ability  Outcome: Met

## 2022-06-18 NOTE — Discharge Summary (Signed)
HOSPITAL MEDICINE DISCHARGE SUMMARY    PATIENT: Paula Daugherty  MR #: [id number]  SEX: female  AGE: 20yr DOB: [id number]    Date of Admission:   06/13/2022 1754 Date of Discharge: 06/18/2022   Admitting Service: Hospital medicine faculty service Discharging Service: (A) [facility]    Attending Physician at Time of Discharge: [name]          Comments to Outpatient Provider:  1. Required 2 units pRBC on admission, underwent EGD showed GAVE, s/p 6 band placement, tolerated procedure well, post op, some mild abdominal pain and nausea which resolved, and tolerating regular diet, H/H has been stable  2. She will continue PPI PO daily x 4 weeks  3. Mild AKI, creatinine 1.4 suspect secondary to dehydration, advised to increase fluid intake and follow up with PCP 06/24/22, outpatient BMP ordered    4. Advised to hold Metformin and Jardiance for now until seen by PCP  5. Continue iron supplement  6. Holding home Amlodipine and Losartan given normotensive  7. 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  Patient has been experiencing progressive fatigue,  chest pain, and dyspnea on exertion in the recent days to weeks. Of note, patient follows with Dr. EGlyn Adeand had a last appointment on 04/30/2022 with plans for repeat EGD on 06/20/2022. Patient receives PRBC and iron infusions  as outpatient and last PRBC transfusion was on 05/16/2022. Today when she had repeat lab work, critically low hgb of 5.8 and  hct 19.2 was noted and patient was asked by Dr. CVirgel Bouquetto come to the ED. GI was consulted and will evaluate patient for any endoscopic procedures.         In the ED, repeat blood work noted hemoglobin level of 6.1, hematocrit 20.2, troponin 9, bnp 122.      On examination, patient denies any symptoms at rest. But does report of recent progressive fatigue, chest pain, occasional palpitations and dyspnea on exertion. She also reports occasional nausea with her gastroparesis, but nothing unusual.  Denies any dizziness, lightheadedness.     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  Patient has been experiencing progressive fatigue, chest pain, and dyspnea on exertion in the recent days to weeks.  Hgb noted to be 5.8 on outpt labs and 6.1 in the ED.      # Acute blood loss anemia r/o GI bleed  # BRBPR  # h/o Neuroendocrine tumor of the duodenum, diagnosed 02/2016  # s/p EGD with GAVE finding  s/p 6 band placement  Patient presented with hgb 5.8 on outpt labs.  Patient had an EGD on 04/05/2021 which was normal with no endoscopic evidence of neoplasm or no mention of any bleeding. Pathology for duodenal mucosa was negative for malignancy. Colonoscopy on 04/05/2021 noted benign appearing polyps, colonic diverticulosis, and external hemorrhoids. Pathology for colonic polyp was positive for tubular adenoma.  S/p 2 unit pRBC and Protonix 80 mg x 1.  s/p EGD with GAVE, placed 6 bands   - GI recommended PPO PO  daily x 4 weeks  - iron supplementation  - pain control oxycodone PRN  - continue zofran PRN       # Chest pain   Patient reports chest pain for past several days to weeks. Accompanied by progressive fatigue and exertional dyspnea. Low yield for ACS suspect demand ischemia.  EKG with no ST elevation or T wave inversion.  Troponin 9, BNP 122.  No chest pain at this time        # HTN  Home regimen include Losartan 25 mg daily, Amlodipine 10 mg daily.  BP stable in 120s/80s  - hold home antihypertensives for as patient is normotensive     # DM Type 2  # Gastroparesis  Home regimen includes Metformin 1000 mg BID, Empagliflozin 25 mg daily   - hold home Empagliflozin 25 mg daily and Metformin in the setting of AKI       # Hyperlipidemia  - Resume Atorvastatin 10 mg qhs      # Anxiety   # Depression  - stable. Patient does not endorse any such feelings.     # AKI  Suspect secondary to dehydration  - encouraged fluid intake  - repeat BMP in 1 week  - follow up with PCP      The patient is currently alert and oriented x 3 and is medically ready for discharge. All post-discharge needs have been communicated to the patient or patient's representative. Clinical Case Management assisted with medical discharge planning and Social Services assisted with non-medical discharge planning.    Disposition:  Medically stable for discharge to home.    At the time of discharge, the patient was tolerating pain with oral medications, tolerating an oral diet, having bowel movements, voiding spontaneously, and ambulating without significant difficulty.  The patient was examined this morning and we believe it is safe for the patient to be discharge from [facility] with close follow up as listed below.     MEDICAL FOLLOW-UP CARE PLAN:  Recommended Follow-Up:    No follow-up provider specified.     Scheduled Appointments:    Future Appointments   Date Time Provider [place]   06/24/2022 11:40 AM Nino Glow, MD  [place] [facility]   08/07/2022 10:15 AM [facility] [place] [facility] [place] [facility]   08/12/2022  9:15 AM Stefanie Libel, MD [facility]N [facility]   09/10/2022 11:00 AM [name], Dolphus Jenny, MD [facility] IM [facility]   10/28/2022 10:00 AM Meryle Ready, MD [facility] [place]   12/10/2022 10:00 AM Corky Mull, MD [facility] IM [facility]   04/06/2023  9:15 AM Daiva Eves, Amanda Pea, OD [facility] PCN FOL[facility]          DISCHARGE INSTRUCTIONS (DIET/ACTIVITY/WOUNDS ETC):  See AVS      DISCHARGE MEDICATIONS         Medication List        START taking these medications  Ferrous Gluconate 324 mg (38 mg iron) Tablet  Take 1 tablet by mouth every Monday, Wednesday and Friday.     Oxycodone 5 mg Tablet  Commonly known as: ROXICODONE  Take 1 tablet by mouth every 6 hours if needed for pain.     Pantoprazole 40 mg Delayed Release Tablet  Commonly known as: PROTONIX  Take 1 tablet by mouth every morning before a meal. Start 2/15  Start taking on: June 19, 2022            CONTINUE taking these medications      Albuterol 90 mcg/actuation inhaler  Commonly known as: PROAIR HFA, PROVENTIL HFA, VENTOLIN HFA  INHALE 1 TO 2 PUFFS BY MOUTH EVERY 6 HOURS AS NEEDED FOR WHEEZING     Atorvastatin 10 mg Tablet  Commonly known as: LIPITOR  Take 1 tablet by mouth every day at bedtime.     Blood Glucose Meter Kit  Commonly known as: ONETOUCH ULTRA2 METER  1 kit one time.     CloNIDine 0.1 mg Tablet  Commonly known as: CATAPRES  Take 1 tablet by mouth every day at bedtime. Indications: "change of life" signs     Empagliflozin 25 mg Tablet  Commonly known as: JARDIANCE  Take 1 tablet by mouth every day.     Metformin 1,000 mg tablet  Commonly known as: GLUCOPHAGE  Take 1 tablet by mouth 2 times daily with meals. (diabetes)     Metoclopramide 5 mg Tablet  Commonly known as: REGLAN  Take 1 tablet by mouth once daily if needed (for nausea).     Nystatin 100,000 unit/gram Powder  Commonly known as: NYSTATIN  Apply a thin layer of powder to affected area 4  times a day as needed     Ondansetron 8 mg disintegrating tablet  Commonly known as: ZOFRAN-ODT  Take 1 tablet by mouth every 8 hours if needed.            STOP taking these medications      Amlodipine 10 mg Tablet  Commonly known as: NORVASC     Losartan 25 mg Tablet  Commonly known as: COZAAR     Omeprazole 20 mg Delayed Release Capsule  Commonly known as: PRILOSEC               Where to Get Your Medications        These medications were sent to [place], [place] [address]      Hours: Weekdays: 8:00 AM to 7:00 PM. [place]: 9:00 AM to 6:00 PM. Phone: [phone]   Ferrous Gluconate 324 mg (38 mg iron) Tablet  Oxycodone 5 mg Tablet  Pantoprazole 40 mg Delayed Release Tablet           COMPLICATIONS:  No immediate complications apparent      DISCHARGE DIAGNOSES:   1    *Iron deficiency anemia due to chronic blood loss          PAST MEDICAL HISTORY:  Past Medical History:   Diagnosis Date    Angina pectoris ([place]) 09/23/2017    Anxiety     Asthma     Cirrhosis ([place])     Constipation, chronic     Depression     Diabetes mellitus (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 Date: 06/18/23     Order Specific Question Answer Comments   Release to patient Immediate [1]      Type and Screen   Standing Status: Future Standing Exp. Date: 06/13/23       ALLERGIES:     Sulfa (Sulfonamide Antibiotics)    Rash  Contrast Dye [Radiopaque Agent]    Hives  Hydrochlorothiazide     Other-Reaction in Comments    Comment:Patient reports  Januvia [Sitagliptin]    Other-Reaction in Comments    Comment:Patient reports  Keflex [Cephalexin]    Abdominal Pain  Lyrica [Pregabalin]    Other-Reaction in Comments    Comment:Patient reports  Omnipaque [Iohexol]    Other-Reaction in Comments    Comment:Patient reports  Prozac [Fluoxetine Hcl]    Other-Reaction in Comments    Comment:Patient reports  Topiramate    Other-Reaction in Comments    Comment:Hairfall      PHYSICAL EXAM AT DISCHARGE:  Vital Signs:  Summary  Temp Min: 36.6 C (97.8 F) Max: 36.8 C (98.3 F)  BP: (114-134)/(59-74)   Pulse Min: 71 Max: 77  Resp Min: 16 Max: 18  SpO2 Min: 94 % Max: 97 %      Current Vitals  Temp: 36.8 C (98.3 F)  BP: 125/67  Pulse: 71  Resp: 16  SpO2: 97 %      Weight: 69.1 kg (152 lb 5.4 oz)    General Appearance: Awake and alert, pleasant and cooperative, no acute distress, answers questions appropriately  Cardiovascular: Regular rate and rhythm, no murmurs, rubs or gallops  Lungs: clear breath sounds.  No tachypnea.    Abdomen: Soft, nondistended, no tenderness to palpation  Back:  No spinal or paraspinal tenderness to palpation.    Extremities: Warm and perfused, no edema, palpable peripheral pulses  Neuro: Symmetrical strength in the upper and lower extremities  Mental Status: No evidence of altered sensorium      LBM: Last Bowel Movement: 06/15/22 (per pt report) (06/17/22 1924)      DISCHARGE LABS:  Lab Results - 24 hours (excluding micro and POC)   POC GLUCOSE PRN     Status: Abnormal   Result Value Status    POC GLUCOSE 154 (H) Final   POC GLUCOSE PRN     Status: Abnormal   Result Value Status    POC GLUCOSE 161 (H) Final   CBC with Differential     Status: Abnormal   Result Value Status    White Blood Cell Count 7.2 Final    Red Blood Cell Count 4.17 Final    Hemoglobin 9.8 (L) Final    Hematocrit 30.8 (L) Final    MCV 74.0 (L) Final    MCH 23.5 (L) Final    MCHC 31.8 (L) Final    RDW 21.3 (H)  Final    MPV 7.3 Final    Platelet Count 208 Final    Neutrophils % Auto 71.6 Final    Lymphocytes % Auto 17.4 Final    Monocytes % Auto  8.2 Final    Eosinophils % Auto 2.3 Final    Basophils % Auto 0.5 Final    Neutrophils Abs Auto 5.2 Final    Lymphocytes Abs Auto 1.3 Final    Monocytes Abs Auto 0.6 Final    Eosinophils Abs Auto 0.2 Final    Basophils Abs Auto 0.0 Final   Comprehensive Metabolic Panel     Status: Abnormal   Result Value Status    Sodium 137 Final    Potassium 4.3 Final    Chloride 103 Final    Carbon Dioxide Total 25 Final    Anion Gap 9 Final    Urea Nitrogen, Blood (BUN) 22 (H) Final    Creatinine Serum 1.44 (H) Final    Glucose 140 (H) Final    Calcium 9.1 Final    Protein 6.9 Final    Albumin 3.9 (L) Final    Alkaline Phosphatase (ALP) 103 Final    Aspartate Transaminase (AST) 31 Final    Bilirubin Total 1.0 Final    Alanine Transferase (ALT) 23 Final    E-GFR Creatinine (Female) 38 Final   POC GLUCOSE PRN     Status: Abnormal   Result Value Status    POC GLUCOSE 140 (H) Final   POC GLUCOSE PRN     Status: Abnormal   Result Value Status    POC GLUCOSE 162 (H) Final         IMAGING THIS ADMISSION:  DX CHEST 2 VIEWS            Time spent in facilitating discharge of the patient today was 55 minutes, including time spent evaluating and counseling the patient and coordinating the discharge.        Report Electronically Signed by:       Aram Beecham, PA-C  Physician [place] APP   Date: 06/18/2022 Time: 12:25    CC to:    Nino Glow, MD

## 2022-06-18 NOTE — Care Plan (Signed)
Problem: Adult Inpatient Plan of Care  Goal: Plan of Care Review  Outcome: Ongoing, Progressing  Flowsheets (Taken 06/18/2022 0006)  Plan of Care Reviewed With: patient  Progress: improving  Outcome Evaluation: AOx4, VSS, on RA. NSR on tele. c/o back pain managed with PRN oxy. PRN melatonin and trazadone given for c/o insomnia. Regular diet tolerated well, pt denies nausea this shift. Pt up independently to bathroom. Plan for possible DC home today. Universal safety precautions maintained, frequent nursing rounds, call light within reach.

## 2022-06-18 NOTE — Discharge Instructions (Addendum)
Please bring this document when you follow up with your primary care provider, ideally within a week after discharge.     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 Resume all home medications as directed in your after visit summary. You may also have been instructed to stop some of your prior home medications.   2. Start new discharge medications as directed in your after visit summary. Let your nurse know if you have questions about any medications.    3. Do not drive or operate machinery while taking narcotic pain medications.   4. You may have been prescribed a pain medicine(s) that contains Tylenol (acetaminophen). If you are taking other Tylenol containing medicines at home, be sure NOT to exceed 4 grams (4000 milligrams) of Tylenol per 24 hours.   5. You may purchase lidocaine patches over the counter from your local drug store. Follow instructions on the packet to apply to painful areas.   6. You may use Miralax, Sena, or Colace as directed as a stool softener if you are on pain medication. This will help with constipation. Do not continue taking if you have diarrhea. These medications are available over the counter or may have been prescribed. Contact your doctor if you have not had a bowel movement in over three days.   7. Do not take NSAIDs, such as aspirin, Motrin, Aleve, or ibuprofen unless your doctor says it's okay, due to potential side effects with these medications.   8. Please do not mix any  medication with alcohol or street drugs. This may inhibit the action of medication and may harm you!    If you have been prescribed a medication containing an opioid (hydrocodone, oxycodone, morphine, hydromorphone): These medications are meant to be temporary for pain relief, and to treat ACUTE pain due to injury or surgery. You should take them only when your other pain medications, such as acetaminophen or ibuprofen, are not enough to relieve pain. They are not meant to take away your pain completely, but may help make your pain tolerable. You should start taking fewer pills than prescribed every day - as your pain level allows - in order to prevent dependence. For example, if you are prescribed 1-2 pills every 4 hours, try taking 1 pill every 4 hours, and then the next day, try taking 1 pill every 6 or 8 hours, until you no longer feel that you need this medication. If you have any questions, ask your nurse before discharge or call your primary provider. If you have increasing pain, or cannot titrate down the medication, seek medical attention immediately, as these symptoms can be a sign of your injury or illness getting worse. You will need to see your primary care doctor or pain specialist for any ongoing refills of pain medications.     You may have been prescribed naloxone/Narcan. This is a treatment to reverse overdose of opioids, if breathing becomes too slow (less than 8 breaths per minute). This medication may or  may not be fully covered by your insurance. It is important to call 911 if overdose is suspected and naloxone had to be administered.      Other therapies for coping with pain  Medicines are not always the only way to treat pain. Here are a few different therapies you can try. Talk with your primary provider to learn more.  Temperature therapy and elevation: Using ice packs and raising your arm or leg can reduce pain and swelling. Heating pads can also help some kinds of pain.  Aromatherapy:  Some patients have found that inhaling essential oils or using them on the skin helps ease pain.   Deep breathing and relaxation: These are two of the best ways to help you cope with pain. Many patients using these methods need less opioid pain medicine.  Distraction: Take your mind off of the pain by focusing on something you enjoy. You can try reading, watching a show, or talking with a friend.  Music therapy: Listening to music can help you cope with pain.    ACTIVITY   1. No strenuous activity for 4 weeks or until cleared by your doctor.   2. Weight bearing status: as tolerated        DIET  Continue your diet from prior to being in the hospital, unless you have been instructed otherwise. If you have been on a restricted diet, gently return to your regular diet, eating neutral foods/small portions. It may take time for your intestines to regain function after surgery or due to opioid medications.     RETURN PRECAUTIONS  1. Follow up with your primary care doctor or specialist clinics regarding your hospital stay, as instructed in the after visit summary.    2. Go to the Emergency Department near you for any symptoms such as fevers with temperature greater than 101.5 F, chills, persistent nausea and vomiting, severe pain not controlled with medications, pus drainage from wound sites, changes in mental status, chest pain, shortness of breath, or for any acute problems or illnesses.  3. Follow up with your primary care provider regarding your general health and any ongoing needs for medication refills.      FOLLOW-UP  Here is a list of your recommended follow-up plan. Please reach out to these clinics to confirm your appointments or with any questions for each specialist:  No follow-up provider specified.    Here is a list of any pre-arranged appointments that you have coming up:   Future Appointments   Date Time Provider [place]   06/24/2022 11:40 AM Nino Glow, MD [place] [facility]   08/07/2022  10:15 AM [facility] [place] [facility] [place] [facility] [facility]   08/12/2022  9:15 AM Stefanie Libel, MD [facility]N [facility]   09/10/2022 11:00 AM [name], Dolphus Jenny, MD [facility] IM [facility]   10/28/2022 10:00 AM Meryle Ready, MD [facility] ENT [place]   12/10/2022 10:00 AM Corky Mull, MD [place] IM [facility]   04/06/2023  9:15 AM Daiva Eves, Amanda Pea, OD OPT[facility] PCN [facility]       INCIDENTAL FINDINGS  You were found to have incidental findings on imaging, not related to your injury or illness. You should discuss these findings with your regular doctor. You may need additional imaging or follow-up:  GAVE on EGD               If you do not have a primary care provider, to find a primary care doctor  that is part of [place] and is accepting new patients, please call the referral center at [phone], or [phone]. You can also visit the website at [email or web address] to submit an online request.

## 2022-06-19 ENCOUNTER — Other Ambulatory Visit: Payer: Self-pay

## 2022-06-19 NOTE — Progress Notes (Signed)
Patient triaged to Health Navigator by way of Other for [employer].  Patient declined services. Patient has a follow up scheduled already. No HN services needed.

## 2022-06-20 ENCOUNTER — Ambulatory Visit: Payer: Medicare Other | Admitting: GASTROENTEROLOGY

## 2022-06-24 ENCOUNTER — Other Ambulatory Visit: Payer: Self-pay | Admitting: GASTROENTEROLOGY

## 2022-06-24 ENCOUNTER — Ambulatory Visit (INDEPENDENT_AMBULATORY_CARE_PROVIDER_SITE_OTHER): Payer: Medicare Other

## 2022-06-24 ENCOUNTER — Ambulatory Visit
Payer: Medicare Other | Attending: Physician Assistant | Admitting: Student in an Organized Health Care Education/Training Program

## 2022-06-24 ENCOUNTER — Encounter: Payer: Self-pay | Admitting: GASTROENTEROLOGY

## 2022-06-24 ENCOUNTER — Encounter: Payer: Self-pay | Admitting: Student in an Organized Health Care Education/Training Program

## 2022-06-24 VITALS — BP 126/82 | HR 71 | Temp 97.3°F | Ht 62.0 in | Wt 160.3 lb

## 2022-06-24 DIAGNOSIS — Z881 Allergy status to other antibiotic agents status: Secondary | ICD-10-CM | POA: Insufficient documentation

## 2022-06-24 DIAGNOSIS — K31811 Angiodysplasia of stomach and duodenum with bleeding: Secondary | ICD-10-CM | POA: Insufficient documentation

## 2022-06-24 DIAGNOSIS — K7400 Hepatic fibrosis, unspecified: Secondary | ICD-10-CM | POA: Insufficient documentation

## 2022-06-24 DIAGNOSIS — Z79899 Other long term (current) drug therapy: Secondary | ICD-10-CM | POA: Insufficient documentation

## 2022-06-24 DIAGNOSIS — K3184 Gastroparesis: Secondary | ICD-10-CM | POA: Insufficient documentation

## 2022-06-24 DIAGNOSIS — D500 Iron deficiency anemia secondary to blood loss (chronic): Secondary | ICD-10-CM

## 2022-06-24 DIAGNOSIS — E1143 Type 2 diabetes mellitus with diabetic autonomic (poly)neuropathy: Secondary | ICD-10-CM | POA: Insufficient documentation

## 2022-06-24 DIAGNOSIS — G8929 Other chronic pain: Secondary | ICD-10-CM | POA: Insufficient documentation

## 2022-06-24 DIAGNOSIS — N179 Acute kidney failure, unspecified: Secondary | ICD-10-CM

## 2022-06-24 DIAGNOSIS — J45909 Unspecified asthma, uncomplicated: Secondary | ICD-10-CM | POA: Insufficient documentation

## 2022-06-24 DIAGNOSIS — Z794 Long term (current) use of insulin: Secondary | ICD-10-CM | POA: Insufficient documentation

## 2022-06-24 DIAGNOSIS — M25571 Pain in right ankle and joints of right foot: Secondary | ICD-10-CM | POA: Insufficient documentation

## 2022-06-24 DIAGNOSIS — K31819 Angiodysplasia of stomach and duodenum without bleeding: Secondary | ICD-10-CM

## 2022-06-24 DIAGNOSIS — I10 Essential (primary) hypertension: Secondary | ICD-10-CM | POA: Insufficient documentation

## 2022-06-24 DIAGNOSIS — E1129 Type 2 diabetes mellitus with other diabetic kidney complication: Secondary | ICD-10-CM | POA: Insufficient documentation

## 2022-06-24 NOTE — Nursing Note (Signed)
Vital signs taken, allergies verified, screened for pain, tobacco hx verified.  Paula Ruta Morales Juarez, MA I     I have asked the patient if they have received any medical treatment or consultation outside of the [facility].  The patient has indicated (yes or no) to receiving consults or services outside the [facility].  If yes, we have requested information to ascertain these results.     Patient WAS wearing a surgical mask  Contact precautions were followed when caring for the patient.

## 2022-06-24 NOTE — Telephone Encounter (Signed)
From: [name]  To: Georgeanne Nim  Sent: 06/16/2022 6:24 PM PST  Subject: EGD    Dr this was finally done tonight. They put in 6 bands but I don't know what that means. My throat is very sore, my upper left stomach quadrant hurts a lot. I was awake for some of the procedure and was very uncomfortable. It seem so different than when you do them. When do I see you again? Thank you

## 2022-06-24 NOTE — Patient Instructions (Signed)
Thank you for letting me participate in your care.  It was very nice to see you today!    If you are checking into lab or x-ray please check into the FRONT DESK first.    If you are not getting your labs done today, you can come in any time during business hours and walk into lab. X-Ray on site also takes walk ins.    IMPORTANT THINGS TO KNOW:   If you had a referral placed today, please give it 10 to 14 business days before calling about the referral as that is the down time it takes for the insurance and referral department to go through the process.    Mychart messages, results, and prescription refills may take up to 72 hours for a response.   Thank you for your patience.    Please call if you have any questions or concerns. Our phone number is [phone]    Follow up as discussed.     FYI:  A new federal rule requires test results to be released to you immediately, even if your provider has not yet had a chance to review and interpret them for you. Your care team may need up to about 3 business days to review your results and explain them, if needed. We appreciate your understanding.     Dr. Knut Rondinelli  Family Medicine Physician   [facility] [place]

## 2022-06-24 NOTE — Progress Notes (Signed)
[facility]  Family Medicine     Chief Complaint   Patient presents with    Follow-up     Follow up [facility]          ASSESSMENT and PLAN:  1. 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 Iron deficiency anemia due to chronic blood loss  Established, stable. S/p hospitalizations for above. Reviewed discharge summary and most recent labs. CBC and ferritin are currently pending.    - continue iron supplementation   - sent message to Dr. Virgel Bouquet regarding plan for follow up   - continue PPI for 1 month as prescribed     3. Chronic pain of right ankle  New to provider, likely 2/2 to prior fracture now with OA. Start PT. Consider xray at follow up if no improving.   - Physical Therapy Referral    4. Primary hypertension  Established, improved. Continue to hold amlodipine and losartan. Consdier restarting losartan at follow up if needed given hx of MD.     5. Type 2 diabetes mellitus with other diabetic kidney complication, with long-term current use of insulin (HCC)  Established, A1c 5.8%. continue to hold metformin and jardiance. Follow up as planned with endo.     6. AKI (acute kidney injury) ([address])  Established, will follow up on BMP to check Cr. Continue to hold above medications.     SUBJECTIVE:  Paula Daugherty is a 77yrold female  here for chief complaint noted above.    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 without evidence of residual disease, hemorrhoids, Iron deficiency anemia receiving iron infusion who presented to the ED on 06/13/2022 with hgb 5.8 on outpatient labs     Here today for ER follow up - underwent EGD and showed GAVE, needed 6 band placement, 2 unit(s) pRBCs on admission   PPI to be continued PO daily x4 weeks   BMP follow up for mild AKI   Holding metformin  and jardiance until seen by me   Continue iron supplement   Holding amlodipine and losartan - was normotensive     Has been taking it at home since getting home   Highest - 131/74     Blood sugars at home: 170, 188, 254  200, 220, 157   AM: 329, 218 - thought bad strips,   New strips: 186, 158     Has been getting weekly labs - wondering when to see him again     She is no longer having black stools   She is now on ferrous sulfate   Wondering about taking pantoprazole      She is having a lot of itching in her wrist and hands - could be diabetes she was told   Using benadryl cream for this   Itching can be that bad   Onset about 6 mo or more   One time it was welted     She also is having urinary incontinence     Reviewed and updated as appropriateI reviewed and appropriately updated her lists of current problems, past medical/surgical history, and social history. :     ROS:  As noted in HPI. 10 point review of systems was otherwise negative.     Current Outpatient Medications   Medication Sig  Albuterol (PROAIR HFA, PROVENTIL HFA, VENTOLIN HFA) 90 mcg/actuation inhaler INHALE 1 TO 2 PUFFS BY MOUTH EVERY 6 HOURS AS NEEDED FOR WHEEZING    Atorvastatin (LIPITOR) 10 mg Tablet Take 1 tablet by mouth every day at bedtime.    Blood Glucose Meter (ONETOUCH ULTRA2 METER) Kit 1 kit one time.    CloNIDine (CATAPRES) 0.1 mg Tablet Take 1 tablet by mouth every day at bedtime. Indications: "change of life" signs    Ferrous Gluconate 324 mg (38 mg iron) Tablet Take 1 tablet by mouth every Monday, Wednesday and Friday.    Metoclopramide (REGLAN) 5 mg Tablet Take 1 tablet by mouth once daily if needed (for nausea).    NYSTATIN 100,000 unit/gram Powder Apply a thin layer of powder to affected area 4 times a day as needed    Ondansetron (ZOFRAN-ODT) 8 mg disintegrating tablet Take 1 tablet by mouth every 8 hours if needed.    Pantoprazole (PROTONIX) 40 mg Delayed Release Tablet Take 1 tablet by mouth every morning before a meal.  Start 2/15     No current facility-administered medications for this visit.        Allergies as of 06/24/2022 - Reviewed 06/24/2022   Allergen Reaction Noted    Sulfa (sulfonamide antibiotics) Rash 07/11/2013    Contrast dye [radiopaque agent] Hives 07/11/2013    Hydrochlorothiazide Other-Reaction in Comments 07/11/2013    Januvia [sitagliptin] Other-Reaction in Comments 07/11/2013    Keflex [cephalexin] Abdominal Pain 07/12/2020    Lyrica [pregabalin] Other-Reaction in Comments 07/11/2013    Omnipaque [iohexol] Other-Reaction in Comments 07/11/2013    Prozac [fluoxetine hcl] Other-Reaction in Comments 07/11/2013    Topiramate Other-Reaction in Comments 07/11/2013        OBJECTIVE:   BP 126/82 (SITE: left arm, Orthostatic Position: sitting, Cuff Size: regular)   Pulse 71   Temp 36.3 C (97.3 F) (Temporal)   Ht 1.575 m (5' 2"$ )   Wt 72.7 kg (160 lb 4.4 oz)   LMP 05/06/1983   SpO2 98%   BMI 29.31 kg/m    Gen: NAD, appears well   HEENT: EOMI, sclera clear, anicteric   Neck: trachea midline, no thyromegaly   Chest: CTAB, no wheezing   CV: RRR, no murmurs   Extrem: no cyanosis or edema, pulses palpable   Skin: no rashes or lesions   Neuro: no focal deficits   Psych: normal mood and affect        Hemoglobin   Date Value Ref Range Status   06/18/2022 9.8 (L) 12.0 - 16.0 g/dL Final   06/17/2022 9.5 (L) 12.0 - 16.0 g/dL Final     Hgb A1C   Date Value Ref Range Status   06/09/2022 5.8 (H) 3.9 - 5.6 % Final     Comment:     5.7 - 6.4% indicates an increased risk for diabetes.  >6.4% is a criterion for the diagnosis of diabetes.    This boronate affinity Hb A1c method provides accurate analytical results in the presence of nearly all hemoglobin variants. Hb F higher than 15% of total Hb may yield falsely low results. Conditions that shorten red cell survival, such as the presence of unstable hemoglobins like Hb SS, Hb CC and Hb SC, or other causes of hemolytic anemia may yield falsely low results. Iron deficiency anemia  may yield falsely high results.   02/10/2022 6.0 (H) 3.9 - 5.6 % Final     Comment:     5.7 - 6.4% indicates an increased risk  for diabetes.  >6.4% is a criterion for the diagnosis of diabetes.    This boronate affinity Hb A1c method provides accurate analytical results in the presence of nearly all hemoglobin variants. Hb F higher than 15% of total Hb may yield falsely low results. Conditions that shorten red cell survival, such as the presence of unstable hemoglobins like Hb SS, Hb CC and Hb SC, or other causes of hemolytic anemia may yield falsely low results. Iron deficiency anemia may yield falsely high results.     Thyroid Stimulating Hormone   Date Value Ref Range Status   05/07/2021 1.02 0.27 - 4.20 uIU/mL Final   10/25/2019 1.91 0.35 - 3.30 uIU/mL Final     Cholesterol   Date Value Ref Range Status   02/10/2022 143 <200 mg/dL Final   02/05/2021 158 <200 mg/dL Final     LDL Cholesterol Calculation   Date Value Ref Range Status   02/10/2022 44 <100 mg/dL Final   02/05/2021 59 <100 mg/dL Final     Creatinine Serum   Date Value Ref Range Status   06/18/2022 1.44 (H) 0.51 - 1.17 mg/dL Final   06/17/2022 1.06 0.51 - 1.17 mg/dL Final            12/10/2021    11:16 AM   PHQ-2   Interest 1   Feeling 1   PHQ-2 Total Score 2     No question data found.           If >  1: Please notify a mental health provider or the patients primary care provider with any concerns about acute risk for suicide.             /15   /12        Total Score Depression Severity   1-4 Minimal Depression   5-9 Mild Depression   10-14 Moderate Depression   15-19 Moderately Severe Depression   20-27 Severe Depression       Depression Treatment Plan:  see assessment/plan section of note    Return in about 6 weeks (around 08/05/2022) for urinary incontinence .    I did review patient's past medical and family/social history, no changes noted.  Barriers to Learning assessed: none. Patient verbalizes understanding of teaching and instructions.  Education  Needs Identified?   no      Patient was counseled specifically on the following: diagnostic results, impressions, and/or recommended diagnostic studies reviewed, instructions for management and/or follow-up, risk factor reduction and patient and family education    I spent 40 minutes with the patient, >50% of which were spent counseling her regarding differential, additional testing, if indicated, potential treatment options, and lifestyle modifications, if applicable    Parts of this note may have been created with the support of Dragon dictation.  Please accept any grammatical, sound-alike, or syntax errors as likely dictation errors.    Electronically signed by:  Vassie Moselle, MD  Associate Physician  [place]

## 2022-06-25 ENCOUNTER — Encounter: Payer: Self-pay | Admitting: Student in an Organized Health Care Education/Training Program

## 2022-06-26 ENCOUNTER — Telehealth: Payer: Self-pay | Admitting: "Endocrinology

## 2022-06-26 ENCOUNTER — Ambulatory Visit: Payer: Medicare Other | Admitting: Student in an Organized Health Care Education/Training Program

## 2022-06-26 NOTE — Telephone Assessment (Signed)
Paperwork from [facility] for diabetic standard written order was received and placed in Dr. Yves Dill box to complete.      Tenna Delaine, MA

## 2022-07-01 NOTE — Telephone Encounter (Signed)
Paperwork from  [employer]   was completed by MD and was faxed to  [employer]   on 07/01/2022    Thank you,    Verner Chol, [place]

## 2022-07-08 NOTE — Telephone Assessment (Signed)
Paperwork from [employer] for diabetic standard written order was completed by MD and  Faxed to [phone] and [phone] on 07-08-22.    Tenna Delaine MA

## 2022-07-10 ENCOUNTER — Ambulatory Visit: Payer: Medicare Other | Admitting: PHYSICAL THERAPIST

## 2022-07-15 ENCOUNTER — Ambulatory Visit: Payer: Medicare Other | Admitting: PHYSICAL THERAPIST

## 2022-07-15 DIAGNOSIS — M25571 Pain in right ankle and joints of right foot: Secondary | ICD-10-CM

## 2022-07-15 DIAGNOSIS — M7670 Peroneal tendinitis, unspecified leg: Secondary | ICD-10-CM

## 2022-07-15 DIAGNOSIS — G8929 Other chronic pain: Secondary | ICD-10-CM

## 2022-07-15 NOTE — Progress Notes (Signed)
PHYSICAL THERAPY EVALUATION 07/15/2022     Diagnosis:  Therapy diagnosis: Peroneal tendonitis, ankle stiffness  Medical diagnosis: M25.571,G89.29 (ICD-10-CM) - Chronic pain of right ankle     Authorizing MD (First and last name) and PI#: [id number]) Paula Glow, MD   Onset Date: Feb 2023     Start of Care Date: 07/15/2022   Prior Level of function: independent and able to walk for long periods to shop  Prior treatment for this diagnosis: no  Rehab potential: Good    Precautions: Chronic anemia with hospitalization [id number]    Certification period end date (for Medicare patients): 10/13/2022  Estimated Discharge Date: 10/13/22    Plan of care:  Established 07/15/2022  1 times a week for a total of 6-12 treatments. Patient has 12 visits currently authorized. Authorization/Referral # [id number] . Expires 06/24/22. Additional authorization may be necessary.    To include   Therapeutic Procedure  Neuromuscular Re-education  Therapeutic Activity  Deep Soft Tissue Massage  Joint Mobilization  Modalities: hot packs and cold packs  Home Exercise Program  Patient/Caregiver Education    Goals:   Patient will be able to be on feet  3-4 hrs without increased symptoms  Patient will be able to walk 30-45  minutes without increased symptoms  Patient will be able to dorsi-flex 10 degrees degrees for normalized gait  Patient able to walk on uneven ground 10 minutes without loss of balance  Patient will be independent with home exercise program and home recommendations     Fall risks Assessment:   No falls in past year. No throw rugs, poorly lighted areas, missing rails/grab bars in the home. No medications or conditions that affect stability or mobility.        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 Start PT. Consider xray at follow up if no improving.   - Physical Therapy Referral    SUBJECTIVE  History of current problem:                          Paula Daugherty is a 77yrold female who presents with a history of R ankle pain and swelling and tightness. She was hospitalized for Upper GI Bleed in Feb. She will have followup with GI April 29 endo.  Staff noticed weakness in R ankle. She denies dragging. She had fracture of R ankle several years ago 3-4 yrs ago.   Doesn't affect balance or walking.     C/c-intermittent pain in lateral ankle below bone ache, swelling but stable, constant tightness    Aggravating factors include: lots of walking 20-30 minutes, WB for day doing errands.   Easing Factors include: heat, elevation    Pre pain: 0/10 tightness  Post pain:  0/10    Pain ranges from   0 /10 at  best   5/10 at worst     Limitations in activities of daily living include:   Standing   Walking     Medical History/Co-morbidities effecting the plan of care: anemia, low Fe   Electronic Medical Record Reviewed: Including radiologist reports of associated images and scans     Past Medical History:  09/23/2017: Angina pectoris  No date: Anxiety  No date: Asthma  No date: Cirrhosis  No date: Constipation, chronic  No date: Depression  No date: Diabetes mellitus  No date: Fatty liver  No date: Hypertension  2015: Kidney disease  No date: Liver fibrosis  05/20/2016: Neoplasm of uncertain behavior of skin  04/17/2016: 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  No date: Psychiatric illness    Past Surgical History:    Procedure Laterality Date    BIOFEEDBACK PERI/URO/RECTAL  10/21/2017    session #1: N Score = 5.4    BIOFEEDBACK PERI/URO/RECTAL  11/04/2017    session #2    BIOFEEDBACK PERI/URO/RECTAL  11/18/2017    Session #3    BIOFEEDBACK PERI/URO/RECTAL  12/02/2017    Session #4    BIOFEEDBACK PERI/URO/RECTAL  12/30/2017    Session #5    BIOFEEDBACK PERI/URO/RECTAL  03/23/2018    Session #6    CAPSULE ENDOSCOPY  01/27/2018    CAPSULE ENDOSCOPY  01/29/2018         CAPSULE ENDOSCOPY  12/23/2021    wnl    COLONOSCOPY  01/04/2014    polyp--TA and erosion; hemorrhoids; tics; repeat x 3 yrs    COLONOSCOPY  12/16/2017    TA; repeat in 3 yrs    COLONOSCOPY  04/05/2021    polyps--path pending; hemorrhoids    EGD  04/2020    gastritis    EGD  04/08/2019    biopsy path--pending    EGD  04/05/2021    biopsies at tattoo--path pending    [facility] BIOFEEDBACK PERI/URO/RECTAL  05/02/2018         HC COLONOSCOPY,REMV LESN,SNARE  12/16/2017    MAMMOPLASTY, REDUCTION  1985    MANOMETRY  09/02/2017    work on constipation; kegels and biofeedback; proceed w/colonoscopy     PHACOEMULSIFICATION, CATARACT Right 02/12/2015    with IOL    PR ESOPHAGOGASTRODUODENOSCOPY TRANSORAL DIAGNOSTIC  02/12/2016    EGD--polyp--path pending; antral gastritis; no varices     PR ESOPHAGOGASTRODUODENOSCOPY TRANSORAL DIAGNOSTIC  06/11/2016    EGD--biopsy at site of carcinoid tumor path pending     PR ESOPHAGOGASTRODUODENOSCOPY TRANSORAL DIAGNOSTIC      EGD--folds in duodenum--path pending     PR ESOPHAGOGASTRODUODENOSCOPY TRANSORAL DIAGNOSTIC  04/2017    EGD; repeat EGD in 1 year     PR ESOPHAGOGASTRODUODENOSCOPY TRANSORAL DIAGNOSTIC  04/20/2018    gastritis; nodule--path pending    PR KNEE SCOPE,DIAGNOSTIC  1980s    Knee arthroscopy    PR LIGATE FALLOPIAN TUBE      Tubal ligation    PR REPAIR TYMPANIC MEMBRANE      Tympanoplasty    PR TOTAL ABDOM HYSTERECTOMY  1980s    partial; no BSO    REPAIR, BLEPHAROPTOSIS Bilateral 08/06/2015    Blepharoplasty bilateral upper lids            Social History/Personal and/or environmental factors effecting the plan of care:  Lives with spouse   Social History     Social History Narrative    Not on file       OBJECTIVE EXAM:  Appearance:   healthy, alert, and cooperative  Gait:    Assistive device used: no assistive device  Gait assessment (description of gait pattern): slight antalgic gait with decreased ankle dorsiflexion in terminal stance R    Inspection  B swelling mild in lower legs and R>left in ankle    Palpation:   Peroneal R tendons at posterior lateral malleolus    Range of motion: WNL (blank) except as noted   Active  Passive     Left Right Left Right   Plantarflexion       Dorsiflexion 0 0 0 0   Inversion        Eversion   5       Other range of motion tested: bilateral lower extremity(ies) within functional limits     Strength: WNL (blank) except as noted    Left Right   Plantarflexion 3 3   Dorsiflexion 3 3   Inversion  5 5   Eversion  5 3     Other strength: B knee and hips grossly within functional limits     Special Tests for the ankle    Left  Right  Reason for test Position of test   Anterior Drawer  negative negative Test for ATFL and other ligamentous laxity or instability    Anterior translation of the Talocrural joint in slight plantarflexion   External Rotation Test    (High Ankle Sprain) Not tested Not tested Test stability of syndesmosis injury (tibiofibular ligament)   Knee Flexed to 90. Externally rotate foot in relation to lower leg.  Check for pain or displacement at medial malleolus    Talar Tilt Test  negative negative Test for ATFL or calcaneofibular ligament Sprain/tear   Stabilize Malleoli and invert ankle.  Check for talar tilt     Plantar fascia Test  Not tested Not tested Provocation test for plantar fasciitis    Dorsiflex the great toe and palpate along plantar fascia   Tarsal Tunnel Test     (tibial nerve) Not tested Not tested Provocation test for tibial nerve at tarsal tunnel    Tap medial ankle posterior  and inferior to medial malleolus        Other special tests: No other special tests done at this time    Treatment today in addition to evaluation(if applicable):     Therapeutic exercise(s) home exercise program added x15 minutes   Access Code: [email or web address]    Exercises  - Seated Ankle Alphabet  - 2 x daily - 7 x weekly - 2 reps  - Ankle and Toe Plantarflexion with Resistance  - 1 x daily - 7 x weekly - 1-2 sets - 10 reps - 3-5 sec hold  - Seated Ankle Eversion with Resistance  - 1 x daily - 7 x weekly - 1-2 sets - 10 reps - 3-5 sec hold  - Gastroc Stretch on Wall (Mirrored)  - 2 x daily - 7 x weekly - 3 reps - 30 sec hold    Therapeutic activities x 15 minutes Patient was educated in the anatomy and physiology involved.  Common symptoms, treatments, responses to treatment, and self management were discussed with the patient and purchase of compression stockings for swelling esp for days with lots of WB or traveling      ASSESSMENT: The patient presents with right ankle pain that is limiting function.  The signs and symptoms are consistent with peroneal tendinopathy.      Patient made informed consent for today's treatment. The procedures, treatment, and plan of care  were discussed, their questions were answered, and the patient contributed and agreed with the process.  The patient would benefit from skilled physical therapy to improve function with: gait, balance    Clinical Presentation: Stable/Uncomplexed     The components of this evaluation necessitated a Low complexity level of clinical decision making.      Plan for next visit: peroneal dfm, ankle dorsiflexion mobilization  (see plan of care at top of note for general plan details)    In the event of unattended discharge, this note will serve as discharge document        Patient Education:      Method of Teaching: demonstration, verbal  and written hand-out     Learner: patient     Response:  verbalizes understanding and able to give return demonstration       Has the plan of care been explained to the patient/caregiver? yes       Is the patient able to understand the plan of care: yes       Does the patient/caregiver(s) agree with the plan of care: yes       Are there barriers to learning? no.         Motivated to learn? yes       Best learning method: verbal,or demo      Patient/Family participation and agreement with recommendations: verbalized agreement    Is patient currently receiving outside care for this problem:     Home Health care services: no  Skilled nursing facility: no    Other no    Nigel Sloop, [place]  PI# [id number]

## 2022-07-23 ENCOUNTER — Ambulatory Visit: Payer: Medicare Other | Admitting: Student in an Organized Health Care Education/Training Program

## 2022-07-23 ENCOUNTER — Encounter: Payer: Self-pay | Admitting: Student in an Organized Health Care Education/Training Program

## 2022-07-23 VITALS — BP 147/76 | HR 65 | Ht 62.0 in | Wt 160.5 lb

## 2022-07-23 DIAGNOSIS — N3941 Urge incontinence: Secondary | ICD-10-CM

## 2022-07-23 DIAGNOSIS — I10 Essential (primary) hypertension: Secondary | ICD-10-CM

## 2022-07-23 DIAGNOSIS — K31811 Angiodysplasia of stomach and duodenum with bleeding: Secondary | ICD-10-CM

## 2022-07-23 DIAGNOSIS — E1129 Type 2 diabetes mellitus with other diabetic kidney complication: Secondary | ICD-10-CM

## 2022-07-23 DIAGNOSIS — Z794 Long term (current) use of insulin: Secondary | ICD-10-CM

## 2022-07-23 DIAGNOSIS — D500 Iron deficiency anemia secondary to blood loss (chronic): Secondary | ICD-10-CM

## 2022-07-23 MED ORDER — OXYBUTYNIN CHLORIDE ER 5 MG TABLET,EXTENDED RELEASE 24 HR
5.0000 mg | EXTENDED_RELEASE_CAPSULE | Freq: Every day | ORAL | 3 refills | Status: DC
Start: 2022-07-23 — End: 2023-03-13

## 2022-07-23 NOTE — Progress Notes (Signed)
[facility]     Chief Complaint   Patient presents with    Ankle Problem     Right ankle follow up    Blood Pressure         ASSESSMENT and PLAN:  1. Primary hypertension  Established, not well controlled. Has been increasing since she got out of the hospital this last month. Restart losartan 25 mg.  Last BMP was normal.   - Losartan (COZAAR) 25 mg Tablet; Take 1 tablet by mouth every day.    2. Urge incontinence  New to provider, sx consistent with urge and stress incontinence. Discussed pelvic floor therapy and medications as options for management.  She would like to start medication. Counseled on side effects including anticholinergic effects.  Consider pelvic floor ref at follow up if patient amenable. Follow up with OB Dr. Ouida Sills as planned. Could consider ref to urogyn if needed.   - Oxybutynin (DITROPAN XL) 5 mg SR Tablet; Take 1 tablet by mouth every day.  Dispense: 90 tablet; Refill: 3    3. 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 Iron deficiency anemia due to chronic blood loss  Established, stable. Has ongoing fatigue. CBC pending per Dr. Virgel Bouquet.  PPI therapy and PO iron are completed.  Follow up with GI as planned.     5. Type 2 diabetes mellitus with other diabetic kidney complication, with long-term current use of insulin  Established, A1c 5.8%. continue to hold metformin and jardiance. Follow up as planned with endo. Sugars are increasing.      SUBJECTIVE:  Paula Daugherty is a 77yr old female  here for chief complaint noted above.    R ankle:   Started PT last week, next appt is tomorrow  Every week for 4 weeks   Had her get compression stockings   Gave her set of exercises that she is doing on home   Still tired all the time   Does feel a little better     CBC and ferritin are scheduled for next week per Dr. Virgel Bouquet   Doing uppers scope end of April   Lots of anal bleeding and black stool   Done with her oral iron   Finished the PPI  regimen as well     Urinary incontinence:   "I pee all the time"   If she doesn't get up and go when she thinks about she won't make it from couch into bathroom with leaking   Never goes without pad or depends   She gets urgency   Frequency is present - goes very often, 2-6 x per night   Tries to cut back on water   Craves ice all the time   Denies dysuria   Sometimes leakage with coughing/laughing   Daughter is on ditropan     HTN:   Home numbers were coming back up at the end of feb   Elevated today   She also notes sugars are going up       Reviewed and updated as appropriateI reviewed and appropriately updated her lists of current problems, past medical/surgical history, and social history. :     ROS:  As noted in HPI. 10 point review of systems was otherwise negative.     Current Outpatient Medications   Medication Sig    Albuterol (PROAIR HFA, PROVENTIL HFA, VENTOLIN HFA) 90 mcg/actuation inhaler INHALE 1 TO 2 PUFFS BY MOUTH EVERY 6 HOURS  AS NEEDED FOR WHEEZING    Atorvastatin (LIPITOR) 10 mg Tablet Take 1 tablet by mouth every day at bedtime.    Blood Glucose Meter (ONETOUCH ULTRA2 METER) Kit 1 kit one time.    CloNIDine (CATAPRES) 0.1 mg Tablet Take 1 tablet by mouth every day at bedtime. Indications: "change of life" signs    Losartan (COZAAR) 25 mg Tablet Take 1 tablet by mouth every day.    Metoclopramide (REGLAN) 5 mg Tablet Take 1 tablet by mouth once daily if needed (for nausea).    NYSTATIN 100,000 unit/gram Powder Apply a thin layer of powder to affected area 4 times a day as needed    Ondansetron (ZOFRAN-ODT) 8 mg disintegrating tablet Take 1 tablet by mouth every 8 hours if needed.    Oxybutynin (DITROPAN XL) 5 mg SR Tablet Take 1 tablet by mouth every day.    Pantoprazole (PROTONIX) 40 mg Delayed Release Tablet Take 1 tablet by mouth every morning before a meal. Start 2/15     No current facility-administered medications for this visit.        Allergies as of 07/23/2022 - Reviewed 07/23/2022    Allergen Reaction Noted    Sulfa (sulfonamide antibiotics) Rash 07/11/2013    Contrast dye [radiopaque agent] Hives 07/11/2013    Hydrochlorothiazide Other-Reaction in Comments 07/11/2013    Januvia [sitagliptin] Other-Reaction in Comments 07/11/2013    Keflex [cephalexin] Abdominal Pain 07/12/2020    Lyrica [pregabalin] Other-Reaction in Comments 07/11/2013    Omnipaque [iohexol] Other-Reaction in Comments 07/11/2013    Prozac [fluoxetine hcl] Other-Reaction in Comments 07/11/2013    Topiramate Other-Reaction in Comments 07/11/2013        OBJECTIVE:   BP (!) 147/76 (SITE: right arm, Orthostatic Position: sitting, Cuff Size: regular)   Pulse 65   Ht 1.575 m (5\' 2" )   Wt 72.8 kg (160 lb 7.9 oz)   LMP 05/06/1983   SpO2 98%   BMI 29.35 kg/m    Gen: NAD, appears well   HEENT: EOMI, sclera clear, anicteric   Neck: trachea midline, no thyromegaly   Chest: CTAB, no wheezing   CV: RRR, no murmurs   Extrem: no cyanosis or edema, pulses palpable   Skin: no rashes or lesions   Neuro: no focal deficits   Psych: normal mood and affect        Hemoglobin   Date Value Ref Range Status   06/24/2022 9.5 (L) 12.0 - 16.0 g/dL Final   06/18/2022 9.8 (L) 12.0 - 16.0 g/dL Final     Hgb A1C   Date Value Ref Range Status   06/09/2022 5.8 (H) 3.9 - 5.6 % Final     Comment:     5.7 - 6.4% indicates an increased risk for diabetes.  >6.4% is a criterion for the diagnosis of diabetes.    This boronate affinity Hb A1c method provides accurate analytical results in the presence of nearly all hemoglobin variants. Hb F higher than 15% of total Hb may yield falsely low results. Conditions that shorten red cell survival, such as the presence of unstable hemoglobins like Hb SS, Hb CC and Hb SC, or other causes of hemolytic anemia may yield falsely low results. Iron deficiency anemia may yield falsely high results.   02/10/2022 6.0 (H) 3.9 - 5.6 % Final     Comment:     5.7 - 6.4% indicates an increased risk for diabetes.  >6.4% is a criterion  for the diagnosis of diabetes.    This  boronate affinity Hb A1c method provides accurate analytical results in the presence of nearly all hemoglobin variants. Hb F higher than 15% of total Hb may yield falsely low results. Conditions that shorten red cell survival, such as the presence of unstable hemoglobins like Hb SS, Hb CC and Hb SC, or other causes of hemolytic anemia may yield falsely low results. Iron deficiency anemia may yield falsely high results.     Thyroid Stimulating Hormone   Date Value Ref Range Status   05/07/2021 1.02 0.27 - 4.20 uIU/mL Final   10/25/2019 1.91 0.35 - 3.30 uIU/mL Final     Cholesterol   Date Value Ref Range Status   02/10/2022 143 <200 mg/dL Final   02/05/2021 158 <200 mg/dL Final     LDL Cholesterol Calculation   Date Value Ref Range Status   02/10/2022 44 <100 mg/dL Final   02/05/2021 59 <100 mg/dL Final     Creatinine Serum   Date Value Ref Range Status   06/24/2022 1.13 0.51 - 1.17 mg/dL Final   06/18/2022 1.44 (H) 0.51 - 1.17 mg/dL Final            07/23/2022     2:17 PM   PHQ-2   Interest 2   Feeling 1   PHQ-2 Total Score 3     No question data found.     1. Little interest or pleasure in doing things: 2  2. Feeling down, depressed or hopeless: 1  3. Trouble falling or staying asleep, or sleeping too much: 2  4. Feeling tired or having little energy: 3  5. Poor appetite or overeating: 1  6. Feeling bad about yourself - or that you are a failure or have let yourself or your family down: 1  7. Trouble concentrating on things such as reading the paper or watching television: 0  8. Moving or speaking so slowly that other people could have noticed?  Or the opposite - being so fidgety or restless that you have been moving around more than usual: 0  9. Thoughts that you would be better off dead or of hurting yourself in some way: 0  If >  1: Please notify a mental health provider or the patients primary care provider with any concerns about acute risk for suicide.      PHQ-9 Score:  (!) 10     PHQ9 Affective-Cognitive Total: 4/15  PHQ9 Physical Total: 6/12        Total Score Depression Severity   1-4 Minimal Depression   5-9 Mild Depression   10-14 Moderate Depression   15-19 Moderately Severe Depression   20-27 Severe Depression       Depression Treatment Plan:  see assessment/plan section of note    Return in about 6 weeks (around 09/03/2022) for incontinence.    I did review patient's past medical and family/social history, no changes noted.  Barriers to Learning assessed: none. Patient verbalizes understanding of teaching and instructions.  Education Needs Identified?   no      Patient was counseled specifically on the following: diagnostic results, impressions, and/or recommended diagnostic studies reviewed, instructions for management and/or follow-up, risk factor reduction and patient and family education    I spent 25 minutes with the patient, >50% of which were spent counseling her regarding differential, additional testing, if indicated, potential treatment options, and lifestyle modifications, if applicable    Parts of this note may have been created with the support of Dragon dictation.  Please accept any  grammatical, sound-alike, or syntax errors as likely dictation errors.    Electronically signed by:  Vassie Moselle, MD  Associate Physician  [facility]

## 2022-07-23 NOTE — Patient Instructions (Signed)
Thank you for letting me participate in your care.  It was very nice to see you today!    Please go to the pharmacy to pick up your medication. Take as prescribed   If you are checking into lab or x-ray please check into the FRONT DESK first.    If you are not getting your labs done today, you can come in any time during business hours and walk into lab. X-Ray on site also takes walk ins.    IMPORTANT THINGS TO KNOW:   If you had a referral placed today, please give it 10 to 14 business days before calling about the referral as that is the down time it takes for the insurance and referral department to go through the process.    Mychart messages, results, and prescription refills may take up to 72 hours for a response.   Thank you for your patience.    Please call if you have any questions or concerns. Our phone number is [phone]    Follow up as discussed.     FYI:  A new federal rule requires test results to be released to you immediately, even if your provider has not yet had a chance to review and interpret them for you. Your care team may need up to about 3 business days to review your results and explain them, if needed. We appreciate your understanding.     Dr. Mahsa Hanser  Family Medicine Physician   [facility] [place]

## 2022-07-23 NOTE — Nursing Note (Signed)
Vital signs taken, allergies verified, screened for pain, and med history taken.         I have asked the patient if they have received any medical treatment/consultations outside of the [facility] within the past 30 days. The patient has indicated no to receiving services outside of the [facility].  If yes, I have requested information to ascertain these results.

## 2022-07-24 ENCOUNTER — Ambulatory Visit: Payer: Medicare Other | Admitting: PHYSICAL THERAPIST

## 2022-07-24 DIAGNOSIS — M25571 Pain in right ankle and joints of right foot: Secondary | ICD-10-CM

## 2022-07-24 DIAGNOSIS — G8929 Other chronic pain: Secondary | ICD-10-CM

## 2022-07-24 NOTE — Progress Notes (Deleted)
Physical Therapy Treatment Note: 07/24/2022       ***       Total # of visits: ***    Total Treatment Time: ***    Pre pain: ***/10  Post pain:       ***/10    Subjective: ***    Objective: Patient seen for the following treatment:    :[id number]:p      Assessment: ***    Plan: continue physical therapy - as per plan of care. ***    Any changes to plan of care, or goals:  YES/NO:63::"no"   If so then plan of care will be sent to MD for approval.     In the event of unattended discharge, this note will serve as discharge document    Nigel Sloop, PT

## 2022-07-24 NOTE — Progress Notes (Signed)
Physical Therapy Treatment Note: 07/24/2022     Diagnosis:  Therapy diagnosis: Peroneal tendonitis, ankle stiffness  Medical diagnosis: M25.571,G89.29 (ICD-10-CM) - Chronic pain of right ankle      Authorizing MD (First and last name) and PI#: [id number]) Nino Glow, MD   Onset Date: Feb 2023     Start of Care Date: 07/15/2022   Prior Level of function: independent and able to walk for long periods to shop  Prior treatment for this diagnosis: no  Rehab potential: Good     Precautions: Chronic anemia with hospitalization [id number]     Certification period end date (for Medicare patients): 10/13/2022  Estimated Discharge Date: 10/13/22     Plan of care:  Established 07/15/2022  1 times a week for a total of 6-12 treatments. Patient has 12 visits currently authorized. Authorization/Referral # [id number] . Expires 06/24/22. Additional authorization may be necessary.    To include   Therapeutic Procedure  Neuromuscular Re-education  Therapeutic Activity  Deep Soft Tissue Massage  Joint Mobilization  Modalities: hot packs and cold packs  Home Exercise Program  Patient/Caregiver Education     Goals:   Patient will be able to be on feet  3-4 hrs without increased symptoms  Patient will be able to walk 30-45  minutes without increased symptoms  Patient will be able to dorsi-flex 10 degrees degrees for normalized gait  Patient able to walk on uneven ground 10 minutes without loss of balance  Patient will be independent with home exercise program and home recommendations      Fall risks Assessment:   No falls in past year. No throw rugs, poorly lighted areas, missing rails/grab bars in the home. No medications or conditions that affect stability or mobility.         Total # of visits: 2/12    Total Treatment Time: 30    Pre pain: 0/10  Post pain:       0/10    Subjective: No feeling of rubber band feeling and swelling and eases with elevation and compression stocking. Overall better and able to get 4 appointment(s) before  cruise. Not more walking so hard to say. Been up on feet cleaning up home for guests for 1 hr and improved.     C/c-intermittent pain in lateral ankle below bone ache, swelling but stable, constant tightness     Aggravating factors include: lots of walking 20-30 minutes, WB for day doing errands.   Easing Factors include: heat, elevation    Objective: Patient seen for the following treatment:     Manual-25 minutes   DFM to peroneal tendon and soft tissue mobilization to peroneal bellies, AP mobilization for dorsiflexion grade 1-3    Therapeutic exercise(s) home exercise program-5 minutes   Access Code: [email or web address]  Exercises  - Seated Ankle Alphabet  - 2 x daily - 7 x weekly - 2 reps  - Ankle and Toe Plantarflexion with Resistance  - 1 x daily - 7 x weekly - 1-2 sets - 10 reps - 3-5 sec hold  - Seated Ankle Eversion with Resistance  - 1 x daily - 7 x weekly - 1-2 sets - 10 reps - 3-5 sec hold  - Gastroc Stretch on Wall (Mirrored)  - 2 x daily - 7 x weekly - 3 reps - 30 sec hold  added  - Standing Soleus Stretch (Mirrored)  - 3 x daily - 7 x weekly - 3 reps - 30 sec hold    Assessment: good  response to dfm and mobilization. Stiff dorsiflexion     Plan: continue physical therapy - as per plan of care. Calf raises, continue dfm and mobs    Any changes to plan of care, or goals: no   If so then plan of care will be sent to MD for approval.     In the event of unattended discharge, this note will serve as discharge document    Nigel Sloop, PT  PI# [id number]

## 2022-07-25 ENCOUNTER — Other Ambulatory Visit: Payer: Self-pay | Admitting: "Endocrinology

## 2022-07-25 DIAGNOSIS — E1129 Type 2 diabetes mellitus with other diabetic kidney complication: Secondary | ICD-10-CM

## 2022-07-25 MED ORDER — METFORMIN 500 MG TABLET: 500.0000 mg | ORAL_TABLET | Freq: Every day | ORAL | 11 refills | Status: DC

## 2022-07-28 ENCOUNTER — Other Ambulatory Visit: Payer: Self-pay | Admitting: "Endocrinology

## 2022-07-28 DIAGNOSIS — E1129 Type 2 diabetes mellitus with other diabetic kidney complication: Secondary | ICD-10-CM

## 2022-07-28 NOTE — Telephone Encounter (Signed)
Pharmacy Refill Optimization (PRO)    Request to resend prescription as a 90 day supply    Refill request has been pended to the pharmacist for review per PRO P&T Approved Protocol 07/28/2022

## 2022-07-29 NOTE — Telephone Encounter (Signed)
Per  PCP visitOffice Visit on 07/23/2022     5. Type 2 diabetes mellitus with other diabetic kidney complication, with long-term current use of insulin  Established, A1c 5.8%. continue to hold metformin and jardiance. Follow up as planned with endo. Sugars are increasing.

## 2022-07-31 ENCOUNTER — Ambulatory Visit (INDEPENDENT_AMBULATORY_CARE_PROVIDER_SITE_OTHER): Payer: Medicare Other

## 2022-07-31 ENCOUNTER — Ambulatory Visit: Payer: Medicare Other | Attending: GASTROENTEROLOGY | Admitting: PHYSICAL THERAPIST

## 2022-07-31 DIAGNOSIS — M25571 Pain in right ankle and joints of right foot: Secondary | ICD-10-CM

## 2022-07-31 DIAGNOSIS — G8929 Other chronic pain: Secondary | ICD-10-CM

## 2022-07-31 DIAGNOSIS — D500 Iron deficiency anemia secondary to blood loss (chronic): Secondary | ICD-10-CM | POA: Insufficient documentation

## 2022-07-31 LAB — CBC NO DIFFERENTIAL
Hematocrit: 33.4 % — ABNORMAL LOW (ref 36.0–46.0)
Hemoglobin: 10.4 g/dL — ABNORMAL LOW (ref 12.0–16.0)
MCH: 23.2 pg — ABNORMAL LOW (ref 27.0–33.0)
MCHC: 31.3 % — ABNORMAL LOW (ref 32.0–36.0)
MCV: 74.3 fL — ABNORMAL LOW (ref 80.0–100.0)
MPV: 8.8 fL (ref 6.8–10.0)
Platelet Count: 184 10*3/uL (ref 130–400)
RDW: 19.6 % — ABNORMAL HIGH (ref 0.0–14.7)
Red Blood Cell Count: 4.5 10*6/uL (ref 4.00–5.20)
White Blood Cell Count: 5.8 10*3/uL (ref 4.5–11.0)

## 2022-07-31 LAB — FERRITIN: Ferritin: 14 ng/mL (ref 13–150)

## 2022-07-31 MED ORDER — METFORMIN 500 MG TABLET: 500.0000 mg | ORAL_TABLET | Freq: Every day | ORAL | 1 refills | Status: DC

## 2022-07-31 NOTE — Progress Notes (Signed)
Physical Therapy Treatment Note: 07/31/2022     Diagnosis:  Therapy diagnosis: Peroneal tendonitis, ankle stiffness  Medical diagnosis: M25.571,G89.29 (ICD-10-CM) - Chronic pain of right ankle      Authorizing MD (First and last name) and PI#: [id number]) Nino Glow, MD   Onset Date: Feb 2023     Start of Care Date: 07/15/2022   Prior Level of function: independent and able to walk for long periods to shop  Prior treatment for this diagnosis: no  Rehab potential: Good     Precautions: Chronic anemia with hospitalization [id number]     Certification period end date (for Medicare patients): 10/13/2022  Estimated Discharge Date: 10/13/22     Plan of care:  Established 07/15/2022  1 times a week for a total of 6-12 treatments. Patient has 12 visits currently authorized. Authorization/Referral # [id number] . Expires 06/24/22. Additional authorization may be necessary.    To include   Therapeutic Procedure  Neuromuscular Re-education  Therapeutic Activity  Deep Soft Tissue Massage  Joint Mobilization  Modalities: hot packs and cold packs  Home Exercise Program  Patient/Caregiver Education     Goals:   Patient will be able to be on feet  3-4 hrs without increased symptoms  Patient will be able to walk 30-45  minutes without increased symptoms  Patient will be able to dorsi-flex 10 degrees degrees for normalized gait  Patient able to walk on uneven ground 10 minutes without loss of balance  Patient will be independent with home exercise program and home recommendations      Fall risks Assessment:   No falls in past year. No throw rugs, poorly lighted areas, missing rails/grab bars in the home. No medications or conditions that affect stability or mobility.         Total # of visits: 3/12    Total Treatment Time:     Pre pain: 0/10  Post pain:       0/10    Subjective: Ankle feeling good and improved and no soreness after last treatment.     No feeling of rubber band feeling and swelling and eases with elevation and  compression stocking. Overall better and able to get 4 appointment(s) before cruise. Not more walking so hard to say. Been up on feet cleaning up home for guests for 1 hr and improved.     Initial  C/c-intermittent pain in lateral ankle below bone ache, swelling but stable, constant tightness     Aggravating factors include: lots of walking 20-30 minutes, WB for day doing errands.   Easing Factors include: heat, elevation    Objective: Patient seen for the following treatment:     Manual-25 minutes   DFM to peroneal tendon and soft tissue mobilization to peroneal bellies, AP mobilization for dorsiflexion grade 1-3    Therapeutic exercise(s) home exercise program-5 minutes   Access Code: [email or web address]  Exercises  - Seated Ankle Eversion with Resistance  - 1 x daily - 7 x weekly - 1-2 sets - 10 reps - 3-5 sec hold  - Gastroc Stretch on Wall (Mirrored)  - 3 x daily - 7 x weekly - 3 reps - 30 sec hold  - Standing Soleus Stretch (Mirrored)  - 3 x daily - 7 x weekly - 3 reps - 30 sec hold  added  - Heel Raises with Counter Support  - 1 x daily - 7 x weekly - 1-3 sets - 10 reps - 3-5 sec hold    Assessment: good response  to dfm and mobilization. Improved dorsiflexion ROM after treatment     Patient made informed consent for today's treatment. The procedures, treatment, and plan of care were discussed, their questions were answered, and the patient contributed and agreed with the process.       Plan: continue physical therapy - as per plan of care. Recheck in 2 week(s) and then after returns from out of town.   Any changes to plan of care, or goals: no   If so then plan of care will be sent to MD for approval.     In the event of unattended discharge, this note will serve as discharge document    Nigel Sloop, PT  PI# [id number]

## 2022-08-05 ENCOUNTER — Encounter: Payer: Self-pay | Admitting: Student in an Organized Health Care Education/Training Program

## 2022-08-05 NOTE — Telephone Encounter (Signed)
From: [name]  To: Vassie Moselle  Sent: 08/04/2022 5:28 PM PDT  Subject: Rash with welting     Hello Dr. Dennis Bast asked me to send pictures of my arm when this occurs out of the blue. Also here is a picture of my red palm    Besides the redness there is itching and welting. I use oral Benadryl 25mg  and extra strength topical Benadryl to help but it mostly just makes me sleepy. It happens often but today I remembered to send you the pictures. lol. I have not changed any products such as soap, lotions, cream, dish soaps or laundry soaps. Please let me know what you think.   Thank you, Paula Daugherty

## 2022-08-05 NOTE — Telephone Encounter (Signed)
Forwarding to Provider to review and respond to patient.  follow up images of rash  Recent Visits  Date Type Provider Dept   07/23/22 Office Visit Nino Glow, MD [facility]   06/24/22 Office Visit Nino Glow, MD [facility]   04/17/22 Office Visit Nino Glow, MD [facility]   04/01/22 Office Visit [name], MD [facility]   12/10/21 Office Visit [name], MD [facility]   07/16/21 Office Visit [name], MD [facility]   04/17/21 Office Visit [name], MD [facility] recent visits within past 540 days with a meds authorizing provider and meeting all other requirements  Future Appointments  Date Type Provider Dept   09/12/22 Appointment Nino Glow, MD [facility]   09/18/22 Appointment Nino Glow, MD [facility]   Showing future appointments within next 150 days with a meds authorizing provider and meeting all other requirements

## 2022-08-06 ENCOUNTER — Ambulatory Visit: Payer: Medicare Other | Admitting: PHYSICAL THERAPIST

## 2022-08-06 NOTE — Telephone Encounter (Signed)
Was patient notified that PCP is on vacation? If not recommend appointment if she is seeking urgent treatment before Dr. Bettina Gavia return.   Thank you.

## 2022-08-07 ENCOUNTER — Ambulatory Visit
Admission: RE | Admit: 2022-08-07 | Discharge: 2022-08-07 | Disposition: A | Discharge status: Home / Self Care | Payer: Medicare Other | Source: Ambulatory Visit | Attending: Obstetrics & Gynecology | Admitting: Obstetrics & Gynecology

## 2022-08-07 ENCOUNTER — Telehealth: Payer: Self-pay | Admitting: GASTROENTEROLOGY

## 2022-08-07 DIAGNOSIS — N83202 Unspecified ovarian cyst, left side: Secondary | ICD-10-CM

## 2022-08-07 NOTE — Telephone Encounter (Signed)
Results Inquiry:    Patient calling for the results of lab test done at [facility] 07/31/22.  The patient would like a call back.     [Notify that results for tests done at [facility] will be auto released on MyChart once they are finalized - lab and X-ray results are released after 1-2 days; surgical pathology results after 14 days. Tests done outside of [place] will not be available in MyChart as they are not entered into our EMR but scanned in as documents]

## 2022-08-08 NOTE — Telephone Encounter (Signed)
Notified Pt PCP out of office  Advised appointment if urgent symptoms

## 2022-08-11 ENCOUNTER — Ambulatory Visit: Payer: Medicare Other | Admitting: PHYSICAL THERAPIST

## 2022-08-11 DIAGNOSIS — M25571 Pain in right ankle and joints of right foot: Secondary | ICD-10-CM

## 2022-08-11 DIAGNOSIS — G8929 Other chronic pain: Secondary | ICD-10-CM

## 2022-08-11 NOTE — Progress Notes (Signed)
Physical Therapy Treatment Note: 08/11/2022     Diagnosis:  Therapy diagnosis: Peroneal tendonitis, ankle stiffness  Medical diagnosis: M25.571,G89.29 (ICD-10-CM) - Chronic pain of right ankle      Authorizing MD (First and last name) and PI#: [id number]) Garth Bigness, MD   Onset Date: Feb 2023     Start of Care Date: 07/15/2022   Prior Level of function: independent and able to walk for long periods to shop  Prior treatment for this diagnosis: no  Rehab potential: Good     Precautions: Chronic anemia with hospitalization 06/2022     Certification period end date (for Medicare patients): 10/13/2022  Estimated Discharge Date: 10/13/22     Plan of care:  Established 07/15/2022  1 times a week for a total of 6-12 treatments. Patient has 12 visits currently authorized. Authorization/Referral # [id number] . Expires 06/24/22. Additional authorization may be necessary.    To include   Therapeutic Procedure  Neuromuscular Re-education  Therapeutic Activity  Deep Soft Tissue Massage  Joint Mobilization  Modalities: hot packs and cold packs  Home Exercise Program  Patient/Caregiver Education     Goals:   Patient will be able to be on feet  3-4 hrs without increased symptoms -met  Patient will be able to walk 30-45  minutes without increased symptoms met  Patient will be able to dorsi-flex 10 degrees degrees for normalized gait met  Patient able to walk on uneven ground 10 minutes without loss of balance progressing  Patient will be independent with home exercise program and home recommendations progressing     Fall risks Assessment:   No falls in past year. No throw rugs, poorly lighted areas, missing rails/grab bars in the home. No medications or conditions that affect stability or mobility.         Total # of visits: 4/12    Total Treatment Time: 30    Pre pain: 0/10  Post pain:       0/10    Subjective: Ankle doing well. No soreness or swelling.No limitations in activities of daily living. Leaving for cruise Thursday    She  has a lot going on with her family and medical conditions of herself and spouse.     Initial  C/c-intermittent pain in lateral ankle below bone ache, swelling but stable, constant tightness   Aggravating factors include: lots of walking 20-30 minutes, WB for day doing errands.   Easing Factors include: heat, elevation      Objective: Patient seen for the following treatment:     Therapeutic exercise(s) home exercise program-30 minutes   Access Code: [email or web address]    Exercises  - Seated Ankle Eversion with Resistance  - 1 x daily - 7 x weekly - 1-2 sets - 10 reps - 3-5 sec hold  - Gastroc Stretch on Wall (Mirrored)  - 3 x daily - 7 x weekly - 3 reps - 30 sec hold  - Standing Soleus Stretch (Mirrored)  - 3 x daily - 7 x weekly - 3 reps - 30 sec hold  - Heel Raises with Counter Support  - 1 x daily - 7 x weekly - 1-3 sets - 10 reps - 3-5 sec hold  added  - Single Leg Stance with Support (Mirrored)  - 1 x daily - 7 x weekly - 3-5 reps - 30 sec hold    Assessment:decreased single leg balance right> left and needs UE support for safety. Patient is making good progress with Physcial Therapy. She will  leave for trip and then address other medical issues. She is independent in home exercise program and will continue.     Patient made informed consent for today's treatment. The procedures, treatment, and plan of care were discussed, their questions were answered, and the patient contributed and agreed with the process.       Plan: continue physical therapy - as per plan of care. Recheck in 3-4 week(s)  after returns from out of town.     Any changes to plan of care, or goals: no   If so then plan of care will be sent to MD for approval.     In the event of unattended discharge, this note will serve as discharge document    Atilano Ina, PT  PI# [id number]

## 2022-08-12 ENCOUNTER — Encounter: Payer: Self-pay | Admitting: Obstetrics & Gynecology

## 2022-08-12 ENCOUNTER — Ambulatory Visit: Payer: Medicare Other | Admitting: Obstetrics & Gynecology

## 2022-08-12 ENCOUNTER — Telehealth: Payer: Self-pay | Admitting: Otolaryngology

## 2022-08-12 VITALS — BP 155/82 | HR 60 | Ht 63.0 in | Wt 157.0 lb

## 2022-08-12 DIAGNOSIS — N83202 Unspecified ovarian cyst, left side: Secondary | ICD-10-CM

## 2022-08-12 DIAGNOSIS — N951 Menopausal and female climacteric states: Secondary | ICD-10-CM

## 2022-08-12 DIAGNOSIS — N3941 Urge incontinence: Secondary | ICD-10-CM

## 2022-08-12 NOTE — Telephone Encounter (Signed)
Reschedule Call    Outbound call to patient/ guardian to advise that appointment with Dr. Trisha Mangle on 10/28/22 has been cancelled due to the provider being unavailable.  Outcome: pt stated she will call back to r.s      Thank you,   Barbara Cower    PSR II- [facility]  Otolaryngology

## 2022-08-12 NOTE — Progress Notes (Signed)
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   --- consider incr clonidine dose, will message me when returns from abroad and will then try new dose. Also BP has been trending up lately so may help with that as well.   --- in future consider effexor vs gabapentin prn (no prozac, caused SI in past when tried for mood sx)      - left OV cyst-  no sx's.   --- not seen well on last [place] (2/2 bowel gas pattern related to recent GI pathology and procedure). Given has already shown stability in prev imaging, defer any further imaging for now (also not able to get MRI 2/2 ear implant)  --- given precautions of sx's related to ovarian pathology- more vague nonspecific- if these occur will repeat US.       - new urge incontinence- rev with PCP. has begun ditropan for urine leaking and has seen some improvement. Tol SEs.     - to FU with PCP for elev BP.       RTC prn       SUBJECTIVE     Paula Daugherty is a 50yr G1 P1. She had concerns including Follow Up With Specialist.    Here for med refill.   Using clonidine for vasomotor sx''s. Was doing well with controlling those until more recently noticed getting more HF again.     Has know left ov cyst, No new pelvic or vaginal sx's, wt changes, fevers, other systemic sx's.     Known hemorrhoids with occas bleeding.         OB History   Gravida Para Term Preterm AB Living   1 1       1    SAB IAB Ectopic Multiple Live Births                  # Outcome Date GA Lbr Len/2nd Weight Sex Delivery Anes PTL Lv   1 Para                - PMH, PSH, FH, SH reviewed in chart.         OBJECTIVE       BP (!) 155/82 (SITE: left arm, Orthostatic Position: sitting, Cuff Size: regular)   Pulse 60   Ht 1.6 m (5\' 3" )   Wt 71.2 kg (156 lb 15.5  oz)   LMP 05/06/1983   SpO2 (!) 80%   BMI 27.81 kg/m       Gen- NAD    US-  ovaries not seen well on recent US 2/2 bowel gas      Total time I spent in care of this patient today (excluding time spent on other billable services) was 30 minutes.       Marthenia Rolling, MD   Assistant Clinical Professor  Obstetrics/Gynecology  PI# [id number]

## 2022-08-12 NOTE — Progress Notes (Signed)
Verified identification with name and DOB. Vitals taken and screened for pain.  M. Graycee Greeson Sr. LVN

## 2022-08-15 NOTE — Progress Notes (Unsigned)
Gastroenterology RN Scrub NOTE: Date: 08/15/2022, 12:50 PM    Procedure: EGD  Indication: IRON DEF/ SURVEILLANCE   Lasts GI procedure/date: EGD / 02/24  Sedation Given: Versed 6 mg, and Fentanyl 100 mcg  Cardiac hx: Echo on 2023 showed 65 % LVEF and  RSVP mmHG (Right Ventricular Systolic Pressure)/PASP not seen on this echo.  See 2023 for recent EKG.    Respiratory: Sleep study / 2015,  AHI 4.3    THANK YOU!!  Dyann Kief, RN  Department of Gastroenterology    To the patient:   If you are reviewing this note and have questions about the meaning or medical terms being used, please schedule an appointment or bring it up at your next follow-up appointment.  Medical notes are a communication tool between medical professionals and require medical terms to be used for efficiency. You can also look up terms via on-line medical dictionaries such as Medline Plus at [employer].it.html

## 2022-08-18 ENCOUNTER — Telehealth: Payer: Self-pay | Admitting: GASTROENTEROLOGY

## 2022-08-18 ENCOUNTER — Telehealth: Payer: Self-pay

## 2022-08-18 NOTE — Telephone Encounter (Signed)
Patient Identified by name, DOB, and phone number.      Callers Name & Relationship:   Melea Cloer (self)      Phone Number & Best Time to Call:   [phone]      Reason For Call:    Pt has EGD scheduled for 09/02/2022 and had lab work done.  CBC, BMP had a few abnormal results (low levels) and pt is wanting to know if Dr Manus Gunning is okay with the results or would he like for pt to redo labs once she returns from her cruise on 08/29/22.     Action Requested:   Please advise          Thank You,   Bunnie Philips, LVN  [facility]

## 2022-08-18 NOTE — Telephone Encounter (Signed)
Called pt - verified with 2 pt identifiers   Pt was on a cruise  and was unable to take my call     Informed pt that I would send instructions via mychart and requested that pt review instructions and give GI Lab a call back if  any questions or concerns           Bunnie Philips, LVN  GI Lab

## 2022-08-19 ENCOUNTER — Ambulatory Visit: Payer: Medicare Other | Admitting: Family Medicine

## 2022-08-19 ENCOUNTER — Telehealth: Payer: Self-pay

## 2022-08-19 NOTE — Telephone Encounter (Signed)
Called pt and left message relaying information below:

## 2022-08-27 ENCOUNTER — Telehealth: Payer: Self-pay | Admitting: Student in an Organized Health Care Education/Training Program

## 2022-08-27 NOTE — Telephone Encounter (Signed)
Reached out to patient regarding appointment for 09/12/2022, no answer.    If patient returns call, please advised PCP is out of clinic and assist with rescheduling for the next available date. Please offer Video Visit for sooner appointments with PCP, if appropriate also ok to schedule with Nurse Practitioner, Coleman.    Nickie   PSR III Lead   [place]

## 2022-08-29 NOTE — Telephone Encounter (Signed)
Patient rescheduled     Paula Daugherty   [facility] PSR II

## 2022-09-01 ENCOUNTER — Ambulatory Visit: Payer: Medicare Other | Admitting: Student in an Organized Health Care Education/Training Program

## 2022-09-01 NOTE — Progress Notes (Signed)
GASTROENTEROLOGY PRE-PROCEDURE H&P    Referring provider: Garth Bigness, MD  Procedure: EGD    Subjective:  HPI: Paula Daugherty is a 77yr old female who presents for: melena, anemia, history of duodenal neuroendocrine tumor.    ZOX:[id number]: negative.  CV: negative.  Resp: negative.  GI: bleeding from rectum.     I did review all available medical, surgical, personal/social history.    Physical exam:  General Appearance: healthy, alert, no distress, pleasant affect, cooperative.  Heart:  normal rate and regular rhythm, no murmurs, clicks, or gallops.  Lungs: clear to auscultation.  Abdomen: BS normal.  Abdomen soft, non-tender.  No masses or organomegaly.    Airway Assessment:    Mallampati class 2  ROM normal      Labs/imaging:  Performed at [facility], reviewed: see computer database.  Performed outside of [place] (reviewed if applicable):     Impression/ plan:  EGD    Patient is a candidate for moderate sedation.  Patient is ASA status: 2    The procedure, risks, benefits, and alternatives were explained.  All patient questions were answered.  The informed consent was signed, and will be scanned into the computer database at a later date.    Patient barriers to learning: none    Patient/family understanding: verbalizes    Bartholomew Crews, MD

## 2022-09-02 ENCOUNTER — Ambulatory Visit
Admission: RE | Admit: 2022-09-02 | Discharge: 2022-09-02 | Disposition: A | Discharge status: Home / Self Care | Payer: Medicare Other | Source: Ambulatory Visit | Attending: GASTROENTEROLOGY | Admitting: GASTROENTEROLOGY

## 2022-09-02 ENCOUNTER — Encounter: Payer: Self-pay | Admitting: GASTROENTEROLOGY

## 2022-09-02 DIAGNOSIS — Z8719 Personal history of other diseases of the digestive system: Secondary | ICD-10-CM | POA: Insufficient documentation

## 2022-09-02 DIAGNOSIS — Z85020 Personal history of malignant carcinoid tumor of stomach: Secondary | ICD-10-CM

## 2022-09-02 DIAGNOSIS — K6389 Other specified diseases of intestine: Secondary | ICD-10-CM | POA: Insufficient documentation

## 2022-09-02 DIAGNOSIS — K31819 Angiodysplasia of stomach and duodenum without bleeding: Secondary | ICD-10-CM | POA: Insufficient documentation

## 2022-09-02 LAB — POC GLUCOSE: POC GLUCOSE: 116 mg/dL — ABNORMAL HIGH (ref 70–99)

## 2022-09-02 MED ORDER — LIDOCAINE HCL 2 % MUCOSAL SOLUTION
5.0000 mL | Freq: Once | Status: AC
Start: 2022-09-02 — End: 2022-09-02
  Administered 2022-09-02: 5 mL via OROMUCOSAL
  Filled 2022-09-02: qty 15

## 2022-09-02 MED ORDER — DIPHENHYDRAMINE 50 MG/ML INJECTION SOLUTION
12.5000 mg | INTRAMUSCULAR | Status: DC | PRN
Start: 2022-09-02 — End: 2022-09-08
  Filled 2022-09-02: qty 1

## 2022-09-02 MED ORDER — ATROPINE 0.1 MG/ML INJECTION SYRINGE
0.5000 mg | INJECTION | INTRAMUSCULAR | Status: DC | PRN
Start: 2022-09-02 — End: 2022-09-08
  Filled 2022-09-02: qty 10

## 2022-09-02 MED ORDER — LACTATED RINGERS IV INFUSION
INTRAVENOUS | Status: DC
Start: 2022-09-02 — End: 2022-09-08

## 2022-09-02 MED ORDER — NALOXONE 0.4 MG/ML INJECTION SOLUTION
0.4000 mg | INTRAMUSCULAR | Status: DC | PRN
Start: 2022-09-02 — End: 2022-09-08
  Filled 2022-09-02: qty 1

## 2022-09-02 MED ORDER — SIMETHICONE 40 MG/0.6 ML ORAL DROPS,SUSPENSION
40.0000 mg | Freq: Once | ORAL | Status: AC
Start: 2022-09-02 — End: 2022-09-02
  Administered 2022-09-02: 40 mg
  Filled 2022-09-02: qty 0.6

## 2022-09-02 MED ORDER — MIDAZOLAM 1 MG/ML INJECTION SOLUTION
0.5000 mg | INTRAMUSCULAR | Status: DC | PRN
Start: 2022-09-02 — End: 2022-09-08
  Administered 2022-09-02: 4 mg via INTRAVENOUS
  Filled 2022-09-02: qty 10

## 2022-09-02 MED ORDER — ONDANSETRON HCL (PF) 4 MG/2 ML INJECTION SOLUTION
4.0000 mg | INTRAMUSCULAR | Status: DC | PRN
Start: 2022-09-02 — End: 2022-09-08
  Administered 2022-09-02: 4 mg via INTRAVENOUS
  Filled 2022-09-02: qty 2

## 2022-09-02 MED ORDER — EPINEPHRINE 0.1 MG/ML INJECTION SYRINGE
1.0000 mg | INJECTION | INTRAMUSCULAR | Status: DC | PRN
Start: 2022-09-02 — End: 2022-09-08
  Filled 2022-09-02: qty 10

## 2022-09-02 MED ORDER — HYDROCORTISONE ACETATE 25 MG RECTAL SUPPOSITORY
25.0000 mg | Freq: Two times a day (BID) | RECTAL | 0 refills | Status: AC
Start: 2022-09-02 — End: 2023-08-28

## 2022-09-02 MED ORDER — FLUMAZENIL 0.1 MG/ML INTRAVENOUS SOLUTION
0.2000 mg | INTRAVENOUS | Status: DC | PRN
Start: 2022-09-02 — End: 2022-09-08
  Filled 2022-09-02: qty 5

## 2022-09-02 MED ORDER — FENTANYL (PF) 50 MCG/ML INJECTION SOLUTION
12.5000 ug | INTRAMUSCULAR | Status: DC | PRN
Start: 2022-09-02 — End: 2022-09-08
  Administered 2022-09-02: 75 ug via INTRAVENOUS
  Filled 2022-09-02: qty 4

## 2022-09-02 NOTE — Discharge Instructions (Addendum)
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  No ulcer, no cancer.  I also biopsied the old site in the duodenum, but there was no evidence of recurrence.    Activity:    Rest today, resume usual activity tomorrow     Diet:    Resume usual diet:     Medication:    Resume all usual medications  A new medication for hemorrhoids was sent to your pharmacy.    Follow up:   Call primary care physician for follow-up appointment and We will notify you of your biopsy results via phone call or letter.If you do not receive notice of your results by three weeks, please call us at the phone number provided below.      During your procedure, air was pumped into your GI tract so your doctor could see clearly to make a diagnosis and/or treat your problem.     Some possible side effects you may experience are:   - Discomfort due to a distended (bloated) abdomen which will subside after a few hours to two days.   - Nausea may be a side effect of the medication and will subside.   - The medications you received may make you dizzy and sleepy, it is important that you do not drive, operate machinery or drink alcohol for at least one day.   - Severe pain is not expected and should be reported    Other side effects may include:  Upper Endoscopy: A sore throat can be helped by cough drops, or a salt-water gargle.    If problems, call [phone], Option 3 during business hours Monday through Friday 7:30am to 4:30 pm.  After hours call [phone] and ask to speak to the GI Fellow on call.         [facility]    DISCHARGE INSTRUCTIONS FOR GI PROCEDURES    During your procedure today air was pumped into your GI tract so your doctor could see clearly.  This MAY cause bloating, belching and a gassy   feeling.  This will usually pass after you move around and go to the bathroom.  Please pass gas.  You were  given sedation during your procedure - TODAY YOU CANNOT DRIVE, OPERATE MACHINERY, DRINK ANY ALCOHOL OR SIGN LEGAL DOCUMENTS for 24 hours.  Please have a responsible adult, 18 years or older, to escort you home.   Severe pain is not expected after this procedure.  Activities:  Rest the day of the procedure.  The day after the procedure, you may do whatever you feel able to do, unless otherwise directed by your physician.   During the procedure, we may have to lift your jaw to help you breathe better so you may experience some soreness afterwards.    EGD/EUS/ENTEROSCOPY  1.  You may have a sore throat after this procedure - use cough drops or a glass of warm water with salt water for gargling.   2.   You may resume your normal diet upon discharge.    ERCP - ENDOSCOPIC RETROGRADE CHOLANGIOPANCREATOGRAPHY  If on discharge you experience: fever, chills or a very painful abdomen - CALL YOUR DOCTOR.  You may have a sore throat after this procedure - use cough drops or a glass of warm water with salt water for gargling.  Stay on a light diet for the rest  of the day.     COLONOSCOPY/FLEXIBLE SIGMOIDOSCOPY/RECTAL EUS  You may experience a gassy feeling after this procedure - move around, sit on a toilet and/or drink peppermint tea.  It can last for a day or two.  The prep you drank may still be working for a day or two.  You may resume your normal diet upon discharge.    The GI Lab is open Monday-Friday 8:00 am-5:00 PM, our phone number is [phone]    What to watch for: fever >101.5, severe abdominal pain, abdominal distention, chest pain, dizziness, weakness, fast pulse rate, bloody stools or any significant bleeding. If you encounter any of the symptoms mentioned and have questions, PLEASE CALL the GI FELLOW on-call after 5:00PM at [phone] or go to YOUR NEAREST EMERGENCY ROOM.

## 2022-09-03 ENCOUNTER — Encounter: Payer: Self-pay | Admitting: Obstetrics & Gynecology

## 2022-09-03 LAB — SURGICAL PATHOLOGY

## 2022-09-03 NOTE — Telephone Encounter (Signed)
From: [name]  To: Verdis Prime  Sent: 09/03/2022 11:27 AM PDT  Subject: Novella Olive dose    Hi Dr. We are back from our cruise and my upper endoscopy was yesterday. You asked me to reach out after these about my hot flashes. We had discussed raising my dose of the above med. The hot flashes continue so I agree we should try that or a different med. I have several pills left and could try taking 2 each evening if you want to see how that goes. I look forward to hearing from you with directions on how to proceed. Thank you, [name]

## 2022-09-09 ENCOUNTER — Ambulatory Visit: Payer: Medicare Other | Attending: Family Medicine

## 2022-09-09 ENCOUNTER — Encounter: Payer: Self-pay | Admitting: GASTROENTEROLOGY

## 2022-09-09 DIAGNOSIS — Z794 Long term (current) use of insulin: Secondary | ICD-10-CM | POA: Insufficient documentation

## 2022-09-09 DIAGNOSIS — E1129 Type 2 diabetes mellitus with other diabetic kidney complication: Secondary | ICD-10-CM | POA: Insufficient documentation

## 2022-09-09 LAB — HEMOGLOBIN A1C
Hgb A1C,Glucose Est Avg: 137 mg/dL
Hgb A1C: 6.4 % — ABNORMAL HIGH (ref 3.9–5.6)

## 2022-09-09 NOTE — Telephone Encounter (Signed)
From: [name]  To: Bartholomew Crews  Sent: 09/08/2022 2:11 PM PDT  Subject: GAVE Dx    Hello. I am reaching out about the results of my Upper Endoscopy last week. I see that no cancer was found and am very glad about that. However, I have never see the above diagnosis for me before. I don't know what it means in terms of treatment/follow up. Or if there is anything you want me to do. I remain very tired with no energy to do much these days. I have an appointment to see you August 14th. Do you want me to do anything until then, like blood work or ????   The suppositories to seem to be helping with less hemorrhoid bleeding right now.     Thank you in advance for your guidance.     Paula Daugherty

## 2022-09-10 ENCOUNTER — Ambulatory Visit: Payer: Medicare Other | Attending: "Endocrinology | Admitting: "Endocrinology

## 2022-09-10 ENCOUNTER — Other Ambulatory Visit: Payer: Self-pay | Admitting: GASTROENTEROLOGY

## 2022-09-10 ENCOUNTER — Encounter: Payer: Self-pay | Admitting: "Endocrinology

## 2022-09-10 DIAGNOSIS — Z7984 Long term (current) use of oral hypoglycemic drugs: Secondary | ICD-10-CM | POA: Insufficient documentation

## 2022-09-10 DIAGNOSIS — Z794 Long term (current) use of insulin: Secondary | ICD-10-CM | POA: Insufficient documentation

## 2022-09-10 DIAGNOSIS — E1129 Type 2 diabetes mellitus with other diabetic kidney complication: Secondary | ICD-10-CM | POA: Insufficient documentation

## 2022-09-10 MED ORDER — METFORMIN 500 MG TABLET: 1000.0000 mg | ORAL_TABLET | Freq: Every day | ORAL | 1 refills | Status: DC

## 2022-09-10 NOTE — Nursing Note (Signed)
Vital signs taken, allergies verified, screened for pain, tobacco hx verified.        Droplet precautions were followed when caring for the patient.   PPE used by provider during encounter: download done in office  Chanda Laperle MAII

## 2022-09-12 ENCOUNTER — Ambulatory Visit: Payer: Medicare Other | Admitting: Student in an Organized Health Care Education/Training Program

## 2022-09-18 ENCOUNTER — Ambulatory Visit
Payer: Medicare Other | Attending: Student in an Organized Health Care Education/Training Program | Admitting: Student in an Organized Health Care Education/Training Program

## 2022-09-18 ENCOUNTER — Encounter: Payer: Self-pay | Admitting: Student in an Organized Health Care Education/Training Program

## 2022-09-18 ENCOUNTER — Ambulatory Visit (INDEPENDENT_AMBULATORY_CARE_PROVIDER_SITE_OTHER): Payer: Medicare Other

## 2022-09-18 VITALS — BP 156/75 | HR 60 | Temp 97.8°F | Ht 62.0 in | Wt 160.3 lb

## 2022-09-18 DIAGNOSIS — R799 Abnormal finding of blood chemistry, unspecified: Secondary | ICD-10-CM

## 2022-09-18 DIAGNOSIS — R413 Other amnesia: Secondary | ICD-10-CM

## 2022-09-18 DIAGNOSIS — F32A Depression, unspecified: Secondary | ICD-10-CM | POA: Insufficient documentation

## 2022-09-18 DIAGNOSIS — Z0000 Encounter for general adult medical examination without abnormal findings: Secondary | ICD-10-CM

## 2022-09-18 LAB — TSH WITH FREE T4 REFLEX: Thyroid Stimulating Hormone: 1.29 u[IU]/mL (ref 0.27–4.20)

## 2022-09-18 LAB — CBC WITH DIFFERENTIAL
Basophils % Auto: 0.5 %
Basophils Abs Auto: 0 10*3/uL (ref 0.0–0.2)
Eosinophils % Auto: 2.3 %
Eosinophils Abs Auto: 0.1 10*3/uL (ref 0.0–0.5)
Hematocrit: 35.8 % — ABNORMAL LOW (ref 36.0–46.0)
Hemoglobin: 11.3 g/dL — ABNORMAL LOW (ref 12.0–16.0)
Lymphocytes % Auto: 25 %
Lymphocytes Abs Auto: 1.4 10*3/uL (ref 1.0–4.8)
MCH: 23.8 pg — ABNORMAL LOW (ref 27.0–33.0)
MCHC: 31.5 % — ABNORMAL LOW (ref 32.0–36.0)
MCV: 75.4 fL — ABNORMAL LOW (ref 80.0–100.0)
MPV: 8.7 fL (ref 6.8–10.0)
Monocytes % Auto: 7.8 %
Monocytes Abs Auto: 0.4 10*3/uL (ref 0.1–0.8)
Neutrophils % Auto: 64.4 %
Neutrophils Abs Auto: 3.5 10*3/uL (ref 1.8–7.7)
Platelet Count: 164 10*3/uL (ref 130–400)
RDW: 18.8 % — ABNORMAL HIGH (ref 0.0–14.7)
Red Blood Cell Count: 4.75 10*6/uL (ref 4.00–5.20)
White Blood Cell Count: 5.4 10*3/uL (ref 4.5–11.0)

## 2022-09-18 LAB — COMPREHENSIVE METABOLIC PANEL
Alanine Transferase (ALT): 25 U/L (ref <=33)
Albumin: 4.5 g/dL (ref 4.0–4.9)
Alkaline Phosphatase (ALP): 96 U/L (ref 35–129)
Anion Gap: 12 mmol/L (ref 7–15)
Aspartate Transaminase (AST): 24 U/L (ref <=41)
Bilirubin Total: 0.9 mg/dL (ref <=1.2)
Calcium: 9.3 mg/dL (ref 8.6–10.0)
Carbon Dioxide Total: 25 mmol/L (ref 22–29)
Chloride: 107 mmol/L (ref 98–107)
Creatinine Serum: 1.14 mg/dL (ref 0.51–1.17)
E-GFR Creatinine (Female): 51 mL/min/{1.73_m2}
Glucose: 135 mg/dL — ABNORMAL HIGH (ref 74–109)
Potassium: 4.6 mmol/L (ref 3.4–5.1)
Protein: 8.2 g/dL (ref 6.6–8.7)
Sodium: 144 mmol/L (ref 136–145)
Urea Nitrogen, Blood (BUN): 30 mg/dL — ABNORMAL HIGH (ref 6–20)

## 2022-09-18 LAB — VITAMIN D, 25 HYDROXY: Vitamin D, 25 Hydroxy: 17.3 ng/mL (ref 10.0–50.0)

## 2022-09-18 LAB — FOLATE: Folate: 6.2 ng/mL — ABNORMAL LOW (ref >=7.0)

## 2022-09-18 LAB — VITAMIN B12: Vitamin B12: 316 pg/mL (ref 213–816)

## 2022-09-18 LAB — IRON TOTAL: Iron Total: 41 ug/dL (ref 37–158)

## 2022-09-18 NOTE — Progress Notes (Signed)
Medicare Wellness Visit     Date of service: 09/18/2022    Verneal Daugherty is a 77yr old female here for a Medicare annual wellness visit.    She has noticed she is struggling more with memory   Did not pass cognitive screening  She has had a lot of stress for herself and daughter   Had a really bad fight with husband - didn't come in with her today which is odd       Health History  I did review patient's past medical and family/social history, no changes noted.    Other Providers:   Patient Care Team:  Garth Bigness, MD as PCP - General (FAMILY PRACTICE)     Functional Status:   ADL/IADL results:       09/18/2022    12:01 AM   Activities of Daily Living   Ambulation 0 - independent   Transferring 0 - independent   Toileting 0 - independent   Continence 0 - independent   Bathing 0 - independent   Dressing 0 - independent   Eating 0 - independent   Communication 0 - understands/communicates without difficulty   Medications independent   Meal Preparation independent   Housekeeping independent   Laundry independent   Shopping independent   Using the telephone independent   Using transportation independent   Handling finances independent       Mood disorder screening:       09/18/2022     8:47 AM   PHQ-9   Interest 2   Feeling 1   Sleeping 2   Tired 3   Appetite 2   Feeling Bad 1   Concentrating 0   Moving and Speaking 0   Suicidal Thoughts 0   Problems Somewhat difficult   PHQ-9 Total 11   PHQ-9 Affective Total 4   PHQ-9 Physical Total 7   PHQ-9 Cognitive Total 0       Cognitive Screen: (clock drawing worth 2 points all-or-nothing; recalled words 1 point each. Score 0-3 = Fail, 4-5 = Pass) Fail     Advance Care Planning:   The patient /  surrogate was counseled about the importance of Advance Care Planning. In addition:  The patient/surrogate and I reviewed the Advance Directive / POLST. The documents in EMR are up to date.   Has Advance Directive? yes  Advance Directive scanned into computer ("Adv Life Planning" in  banner)? Yes  Has POLST? no  POLST scanned into computer ("Adv Life Planning" in banner)" No      Review of Systems:  Review of Systems - negative     Exam:    BP (!) 156/75 (SITE: left arm, Orthostatic Position: sitting, Cuff Size: regular)   Pulse 60   Temp 36.6 C (97.8 F) (Temporal)   Ht 1.575 m (5\' 2" )   Wt 72.7 kg (160 lb 4.4 oz)   LMP 05/06/1983   SpO2 98%   BMI 29.31 kg/m     Physical Exam  Gen: NAD, appears well   HEENT: EOMI, sclera clear, anicteric   Neck: trachea midline, no thyromegaly   Chest: CTAB, no wheezing   CV: RRR, no murmurs   Extrem: no cyanosis or edema, pulses palpable   Skin: no rashes or lesions   Neuro: no focal deficits   Psych: normal mood and affect        Plan for Treatment, Screening/Prevention  See Patient Instructions-- AVS printed and given to patient    Plan of  Care Discussed With:   Patient (has decisional capacity)    Education Plan:  See information in the Patient Instructions section    Barriers to Learning assessed: none. Patient verbalizes understanding of teaching and instructions.    Assessment:  1. Medicare annual wellness visit, subsequent  - MAR reviewed and updated   - cancer screenings up to date   - did not pass cognitive screen, addressed below       2. Memory deficits  New, cognitive screen not passed today. Multiple stressors at home.  Labs ordered and referral placed to healthy aging for formal assessment.   - Healthy [facility]ging [facility] Referral  - Comprehensive Metabolic Panel; Future  - Vitamin B12; Future  - Folate; Future  - Iron Total; Future  - TSH with Free T4 Reflex; Future  - Vitamin D, 25 Hydroxy; Future  - CBC with Differential; Future    3. Abnormal finding of blood chemistry, unspecified  Plan as above.   - Iron Total; Future          09/18/2022     8:47 AM   PHQ-2   Interest 2   Feeling 1   PHQ-2 Total Score 3     No question data found.      1. Little interest or pleasure in doing things: 2  2. Feeling down, depressed or hopeless: 1  3. Trouble  falling or staying asleep, or sleeping too much: 2  4. Feeling tired or having little energy: 3  5. Poor appetite or overeating: 2  6. Feeling bad about yourself - or that you are a failure or have let yourself or your family down: 1  7. Trouble concentrating on things such as reading the paper or watching television: 0  8. Moving or speaking so slowly that other people could have noticed?  Or the opposite - being so fidgety or restless that you have been moving around more than usual: 0  9. Thoughts that you would be better off dead or of hurting yourself in some way: 0  If >  1: Please notify a mental health provider or the patients primary care provider with any concerns about acute risk for suicide.   10. If you checked off any problems, how difficult have these problems made it for you to do your work, take care of things at home, or get along with other people?: Somewhat difficult  PHQ-9 Score: (!) 11     PHQ9 Affective-Cognitive Total: 4/15  PHQ9 Physical Total: 7/12        Total Score Depression Severity   1-4 Minimal Depression   5-9 Mild Depression   10-14 Moderate Depression   15-19 Moderately Severe Depression   20-27 Severe Depression         Depression Treatment Plan:  see assessment/plan section of note     If you are reviewing this progress note and have questions about the meaning of medical terms being used, please feel free to schedule an appointment or bring it up at your next follow-up visit.  Medical notes are meant to be a communication tool between medical professionals and require that medical terms be used for efficiency.     Electronically signed by:  Wyatt Portela, MD  Associate Physician  [facility]

## 2022-09-18 NOTE — Progress Notes (Signed)
Lifestyle:   Diet: typical American, and has no regular exercise program.    Fall risks Assessment:   No falls in past year. No throw rugs, poorly lighted areas, missing rails/grab bars in the home. No medications or conditions that affect stability or mobility.     Cognitive Screen:  Number of words recalled (Mini-Cog): 3  Clock drawing left for MD interpretation    Advance Directive:  Do you have an Advance Directive? yes  Advance Directive scanned into chart? Yes scanned under media.

## 2022-09-18 NOTE — Nursing Note (Signed)
Vital signs taken, allergies verified, screened for pain, tobacco hx verified.  Paula Finerty Morales Juarez, MA I     I have asked the patient if they have received any medical treatment or consultation outside of the [facility].  The patient has indicated (yes or no) to receiving consults or services outside the  Health system.  If yes, we have requested information to ascertain these results.     Patient WAS wearing a surgical mask  Contact precautions were followed when caring for the patient.

## 2022-09-18 NOTE — Patient Instructions (Signed)
Thank you for letting me participate in your care.  It was very nice to see you today!    Please go to lab in lobby.  You do not have to be fasting.   If you are checking into lab or x-ray please check into the FRONT DESK first.    If you are not getting your labs done today, you can come in any time during business hours and walk into lab. X-Ray on site also takes walk ins.    IMPORTANT THINGS TO KNOW:   If you had a referral placed today, please give it 10 to 14 business days before calling about the referral as that is the down time it takes for the insurance and referral department to go through the process.    Mychart messages, results, and prescription refills may take up to 72 hours for a response.   Thank you for your patience.    Please call if you have any questions or concerns. Our phone number is [phone]    Follow up as discussed.     FYI:  A new federal rule requires test results to be released to you immediately, even if your provider has not yet had a chance to review and interpret them for you. Your care team may need up to about 3 business days to review your results and explain them, if needed. We appreciate your understanding.     Dr. Ewen Varnell  Family Medicine Physician   [facility] [place]

## 2022-09-26 ENCOUNTER — Encounter: Payer: Self-pay | Admitting: Student in an Organized Health Care Education/Training Program

## 2022-09-26 DIAGNOSIS — E538 Deficiency of other specified B group vitamins: Secondary | ICD-10-CM

## 2022-09-26 DIAGNOSIS — E559 Vitamin D deficiency, unspecified: Secondary | ICD-10-CM

## 2022-09-26 NOTE — Telephone Encounter (Signed)
Forwarding to Provider to review and respond to patient.

## 2022-09-26 NOTE — Telephone Encounter (Signed)
From: [name]  To: Wyatt Portela  Sent: 09/25/2022 8:15 PM PDT  Subject: Folate 1mg      Hello Dr. When I saw you on May 16th you asked me to start taking Vitamin D and Folate . You did not tell me the IU for the Vitamin D. Also I have been to 3 different pharmacies and they all said that Folate 1 mg requires an Rx do to the dosage. If you want me to take it please send an Rx to my [facility] on file.     Thank you very much.   Enjoy your long weekend.

## 2022-09-28 NOTE — Progress Notes (Signed)
Endocrinology Follow up clinic note:    Chief Complaint:  "I'm here to follow up on my diabetes."    HPI: Paula Daugherty is a 77yr old female who has a diagnosis of type 2 diabetes mellitus who presents to Endocrinology for follow up. Her history is as follows:  She was initially diagnosed with diabetes around 2000 on routine labs. She has a strong family history of diabetes so she wasn't surprised at this. She was started on Metformin around 2005 and then she was started on insulin in 2014. She was up to 64 units of Lantus at night. However, after making changes to her diet and lifestyle, she was taken off insulin in 2015. Basal insulin was added back to her regimen in 2018 and she was later able to discontinue this.      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     Interim History: She returns today for follow up. Her last visit with Endocrinology was on 06/11/2022.She states that since this visit, she has been dealing with ongoing health issues. 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 She is feeling a bit discouraged by this new diagnosis and ongoing medical issues.   From a diabetes standpoint, she has been monitoring her glucose and thinks that overall, this has been well controlled, though at times, this will be slightly higher than she'd like. She has not had any recent hypoglycemia.     ROS:  All other systems were reviewed and are negatative except for pertinent positive and negative responses as documented in HPI.     Medications:  Medication reconciliation was performed today.   Outpatient Medications Marked as Taking for the 09/10/22 encounter (Office Visit) with Ella Jubilee, MD   Medication Sig Dispense Refill    Albuterol (PROAIR HFA, PROVENTIL HFA, VENTOLIN HFA) 90 mcg/actuation inhaler INHALE 1 TO 2 PUFFS BY MOUTH EVERY 6 HOURS AS NEEDED FOR WHEEZING 8.5 g 0     Atorvastatin (LIPITOR) 10 mg Tablet Take 1 tablet by mouth every day at bedtime. 90 tablet 3    CloNIDine (CATAPRES) 0.1 mg Tablet Take 1 tablet by mouth every day at bedtime. Indications: "change of life" signs 90 tablet 3    Hydrocortisone (ANUSOL-HC) 25 mg Suppository Insert 1 suppository into the rectum every 12 hours. 12 suppository 0    Losartan (COZAAR) 25 mg Tablet Take 1 tablet by mouth every day.      Metformin (GLUCOPHAGE) 500 mg Tablet Take 2 tablets by mouth once daily with a meal. 90 tablet 1    Metoclopramide (REGLAN) 5 mg Tablet Take 1 tablet by mouth once daily if needed (for nausea).      NYSTATIN 100,000 unit/gram Powder Apply a thin layer of powder to affected area 4 times a day as needed 60 g 1    Ondansetron (ZOFRAN-ODT) 8 mg disintegrating tablet Take 1 tablet by mouth every 8 hours if needed. 100 tablet 1    Oxybutynin (DITROPAN XL) 5 mg SR Tablet Take 1 tablet by mouth every day. 90 tablet 3     I did review patient's past medical and family/social history, no changes noted.   PMH:  Past medical history was reviewed from problem list.   Patient Active Problem List   Diagnosis    Diabetes mellitus (HCC)    Depression with anxiety  Stress at home    Fibromyalgia    Hypertension    GERD (gastroesophageal reflux disease)    Hyperlipidemia with target LDL less than 70    Abnormal LFTs    Renal cyst ( 6.4 cm cyst left kidney)    Cough    Fatty liver    Macroalbuminuric diabetic nephropathy (HCC)    History of actinic keratoses    Cataract    Ptosis of eyelid    Liver fibrosis    Adrenal nodule (HCC)    Medication Therapy Auth    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HCC)    Mixed conductive and sensorineural hearing loss of both ears    Neoplasm of uncertain behavior of skin    Abdominal pain, generalized    Nausea without vomiting    Asymptomatic microscopic hematuria    Subcutaneous nodule    Angina pectoris (HCC)    Mild episode of recurrent major depressive disorder (HCC)    NASH  (nonalcoholic steatohepatitis)    Iron deficiency anemia due to chronic blood loss    Disorder of kidney     VITAL SIGNS:  BP (!) 142/76 (SITE: left arm, Orthostatic Position: sitting, Cuff Size: regular)   Pulse 54   Temp 36.4 C (97.6 F) (Temporal)   Ht 1.575 m (5\' 2" )   Wt 72.9 kg (160 lb 11.5 oz)   LMP 05/06/1983   SpO2 99%   BMI 29.40 kg/m   Body mass index is 29.4 kg/m.    PHYSICAL EXAM:  General Appearance: healthy, alert, no distress, pleasant affect, cooperative.  Eyes:  conjunctivae and corneas clear. EOM's intact. sclerae normal.  Neck:  Neck supple.   Heart:  normal rate and regular rhythm, no murmurs, clicks, or gallops.  Lungs: clear to auscultation.  Abdomen: BS normal.  Abdomen soft, non-tender.  No masses or organomegaly.  Extremities:  no cyanosis, clubbing, or edema.  Skin:  Skin color, texture, turgor normal. No rashes or lesions.  Neuro: No tremor appreciated.   Mental Status: Appearance/Cooperation: in no apparent distress and well developed and well nourished  Eye Contact: normal    LAB TESTS/STUDIES:   I personally reviewed the following  laboratory and/or imaging studies .   06/18/22 07:24 06/24/22 08:32 07/31/22 13:06 09/09/22 08:25   Sodium 137 141     Potassium 4.3 4.4     Chloride 103 105     Carbon Dioxide Total 25 26     Anion Gap 9 10     Urea Nitrogen, Blood (BUN) 22 (H) 19     Creatinine Blood 1.44 (H) 1.13     Glucose 140 (H) 154 (H)     Calcium 9.1 9.2     Protein 6.9      Albumin 3.9 (L)      Alkaline Phosphatase (ALP) 103      Aspartate Transaminase (AST) 31      Bilirubin Total 1.0      Alanine Transferase (ALT) 23      E-GFR Creatinine (Female) 38 51     Ferritin  16 14    Hgb A1C    6.4 (H)   Hgb A1C,Glucose Est Avg    137      07/31/22 13:06   White Blood Cell Count 5.8   Red Blood Cell Count 4.50   Hemoglobin 10.4 (L)   Hematocrit 33.4 (L)   MCV 74.3 (L)   MCH 23.2 (L)   MCHC 31.3 (L)   RDW 19.6 (  H)   Platelet Count 184      02/10/22 09:30   Creatinine Spot Urine  131.3  131.3   Microalbumin Urine 19.0   Microalbumin/Creatinine Ratio 145     Thyroid ultrasound (09/25/2021):  FINDINGS:  Parenchymal echotexture: heterogeneous.  Right lobe: 3.9 x 1.4 x 1.9 cm (5.1 cc).  Nodules:  Subcentimeter nodule(s) present but none that meet criteria for FNA or  follow up.  Left lobe: 4.1 x 1.7 x 2.1 cm (6.9 cc).  Nodules:  1. Mid lobe, 0.9 cm, solid or almost completely solid (2 pts),  hypoechoic (2 pts), wider-than-tall (0 pt), with smooth margins (0 pt).  Macrocalcifications: absent (0 pt). Peripheral rim calcification: absent (0  pt). Punctate echogenic foci: none (0 pt). TR4 - Moderately suspicious.  Subcentimeter nodule(s) present but none that meet criteria for FNA or  follow up.  Isthmus: 0.3 cm.  Nodules:  none.  No abnormal cervical lymph nodes.      IMPRESSION:  1. No nodules meet criteria for FNA or follow-up.     CT Abdomen and Pelvis (08/22/2020):  FINDINGS:  Lower Chest: Unremarkable.  Liver: A few scattered small hypodensities too small to characterize. Mild  liver surface nodularity.  Bile Ducts: Unremarkable.  Gallbladder: Unremarkable.  Pancreas: Unremarkable.  Spleen: Unremarkable.  Adrenal Glands: Unremarkable.  Kidneys: Decreased size of left posterior renal cyst with some peripheral  calcifications.  GI Tract: Colonic diverticulosis without diverticulitis. Small soft tissue  nodule anterior to the descending colon previously had fat density and  therefore likely represents an small area of fat process or old torsed  epiploic appendage. Normal appendix.  Peritoneal Cavity: No free fluid or free air.  Bladder: Unremarkable.  Uterus and Ovaries: Status post hysterectomy. This 3.0 cm cystic lesion of  the left adnexa that is either new or increased from the prior. It does not  have simple fluid attenuation.  Lymph Nodes: Prominent upper abdominal lymph nodes are slightly increased  including a 1.7 lymph node anterior to the hepatic artery, previously 1.2  cm  Major Vascular  Structures: There is atherosclerosis of the aorta and branch  vessels.  Soft Tissues: Unremarkable.  Musculoskeletal: Unremarkable.     IMPRESSION:  1. Diverticulosis without diverticulitis.  2. Increasing/new 3.0 cm nonsimple fluid cystic lesion of the left  adnexa. Recommend further evaluation with pelvic ultrasound.  3. Liver surface nodularity suggesting fibrosis.  4. Slight increased upper abdominal mild lymphadenopathy measuring up  to 1.7 cm is nonspecific. The slow growth argues against an aggressive  process. If patient proves to have chronic liver disease, mild upper  abdominal lymphadenopathy is often associated with this.     Impression: This is a 77yr old female with Diabetes Mellitus Type II who presents to Endocrinology for follow up. Overall, her glycemic control is at goal as evidenced by her most recent A1c of 6.4%. Review of her glucose trends on her meter download demonstrates that for the most part, this is well controlled and I have not recommended any changes to her medications today, however, she will continue to monitor her glucose closely and follow up with glycemic trends by mychart.      She also has a history of an adrenal nodule. Hormonal evaluation was last done and was normal in 04/2021.      DIABETES HISTORY  (-) h/o retinopathy.      (-) h/o microalbuminuria/nephropathy.  (-) h/o neuropathy.  (-) h/o Autonomic dysfunction  (-) h/o Gastroparesis  (-) h/o CAD  (-)  h/o PVD     Last eye exam: 03/2022, f/up 1 year  Last urine microalbumin: 02/2022, 145  Pneumovax: 12/2014, Prevnar 2018  Influenza vaccine: fall 2023  (-) ASA  (+) Statin  (+) ACE/ARB          She was incidentally noted to have a thyroid nodule on a CT scan on the neck and dedicated thyroid ultrasound demonstrated a 0.9 cm right TIRADS-4 nodule. We discussed options, including no further follow up, FNA and monitoring with ultrasound and after discussion, Ms. Maillard is in agreement with follow up ultrasound in 1 year's time.       Recommendations:  (E11.29) Type 2 diabetes mellitus with other diabetic kidney complication  (primary encounter diagnosis)  - Continue current doses of Jardiance and metformin   - Encouraged dietary changes as the patient has been working on  - Encouraged patient to continue close blood glucose monitoring, will notify me on glucose trends at home  - Last LDL at goal, continue statin, repeat due 02/2023  - Last microalbumin:creatinine ratio elevated, continue cozaar, repeat due 02/2023  - Up to date on screening eye exam, f/up as scheduled in 04/2023  - Repeat A1c prior to follow up appointment  - Influenza vaccine and COVID-19 vaccinations up to date  - Discussed daily foot care    Approximately 20 minutes were spent with patient, greater than 50% of which was spent counseling the patient on diabetes management and on coordination of care.    Follow up in 3 months    If you have any questions, please do not hesitate to contact me at [phone].  Thank you for allowing me to participate in the care of this patient.    EDUCATION:  I educated/instructed the patient or caregiver regarding all aspects of the above stated plan of care.  The patient or caregiver indicated understanding.      [place] interpreter was not used.    Report electronically signed by:  Alonza Smoker, M.D.  Clinical Associate Professor  Department of Endocrinology

## 2022-09-30 ENCOUNTER — Encounter: Payer: Self-pay | Admitting: Obstetrics & Gynecology

## 2022-09-30 MED ORDER — FOLIC ACID 1 MG TABLET
1.0000 mg | ORAL_TABLET | Freq: Every day | ORAL | 11 refills | Status: DC
Start: 2022-09-30 — End: 2022-10-15

## 2022-09-30 NOTE — Telephone Encounter (Signed)
From: [name]  To: Verdis Prime  Sent: 09/28/2022 10:02 PM PDT  Subject: Clonidine 0.1 mg    Hello Dr. Bonita Quin asked me to reach out to you after upping the above dose to 2 tabs each evening. I have done since my last visit. I think it has been somewhat helpful. Please let me know how you would like me to proceed. If you want me stay on 0.2 mg please send Rx to [facility] on [address]. Thank for your patience and kindness.

## 2022-10-01 ENCOUNTER — Encounter: Payer: Self-pay | Admitting: "Endocrinology

## 2022-10-01 MED ORDER — CLONIDINE HCL 0.1 MG TABLET
0.2000 mg | ORAL_TABLET | Freq: Every day | ORAL | 3 refills | Status: DC
Start: 2022-10-01 — End: 2023-06-29

## 2022-10-06 ENCOUNTER — Ambulatory Visit: Payer: Self-pay | Admitting: Student in an Organized Health Care Education/Training Program

## 2022-10-06 ENCOUNTER — Ambulatory Visit: Admit: 2022-10-06 | Discharge: 2022-10-06 | Disposition: A | Discharge status: Home / Self Care | Payer: Self-pay

## 2022-10-06 DIAGNOSIS — I639 Cerebral infarction, unspecified: Secondary | ICD-10-CM

## 2022-10-06 HISTORY — DX: Cerebral infarction, unspecified: I63.9

## 2022-10-06 NOTE — Telephone Encounter (Signed)
Noted thank you

## 2022-10-06 NOTE — Telephone Encounter (Signed)
Paula Daugherty is a 77yr old female    3 patient identifiers used.  Per:   patient.    Disposition: Referred to nearest ED (overriding SEE HCP (OR PCP TRIAGE) WITHIN 4 HOURS)   Referred to ER per Nurse Judgement  Per:   patient verbalizes agreement to plan. Agrees to callback with any increase in symptoms/concerns or questions.    See Assessment Below    On 6/1, looks like I had a stroke, had numbness on the R side of face and tongue, was having a hard time putting words together.  Was a little confused also which lasted for about couple of hours. Hx of DM  and has not eaten for 12 hours when the incident happened. Also had sever headache but it went away w/o taking meds.   Blood Sugar was 160. BP 140/90.    Yesterday and today, felt fine, back to normal, no numbness, no weakness, speaking clearly and not in any distress. NO fever, SOB , chest pain or dizziness.     No available appointment today, advised to go to ED. States, she will go to [facility].         See Care Advice Below  Care Advice            Confusion - Delirium-ADULT-AH  Andi Devon, RN Mon Oct 06, 2022 10:15 AM      Care Advice       SEE HCP (OR PCP TRIAGE) WITHIN 4 HOURS:  * IF OFFICE WILL BE OPEN: You need to be seen within the next 3 or 4 hours. Call your doctor (or NP/PA) now or as soon as the office opens.  * IF OFFICE WILL BE CLOSED AND NO PCP (PRIMARY CARE PROVIDER) SECOND-LEVEL TRIAGE: You need to be seen within the next 3 or 4 hours. A nearby Urgent Care Center [facility]) is often a good source of care. Another choice is to go to the ED. Go sooner if you become worse.  * IF OFFICE WILL BE CLOSED AND PCP SECOND-LEVEL TRIAGE REQUIRED: You may need to be seen. Your doctor (or NP/PA) will want to talk with you to decide what's best. I'll page the on-call provider now. If you haven't heard from the provider (or me) within 30 minutes, call again. NOTE: If on-call provider can't be reached, send to [facility] or ED.    NOTE TO TRIAGER:  * Use nurse  judgment to select the most appropriate source of care.  * Consider both the urgency of the patient's symptoms AND what resources may be needed to evaluate and manage the patient.    SOURCES OF CARE:  * ED: Patients who may need surgery or hospital admission need to be sent to an ED. So do most patients with serious symptoms or complex medical problems.  * UCC: Some UCCs can manage patients who are stable and have less serious symptoms (e.g., minor illnesses and injuries). The triager must know the [facility] capabilities before sending a patient there. If unsure, call ahead.  * OFFICE: If patient sounds stable and not seriously ill, consult PCP (or follow your office policy) to see if patient can be seen NOW in office.                          Disposition History         Changed to Changed from    Disposition: Referred to nearest ED SEE HCP (OR PCP TRIAGE) WITHIN  4 HOURS    Date/Time: 10/06/2022 10:15 AM 10/06/2022 10:09 AM    User: Coralee North, Jaamal Farooqui    Override reason: No appointment available    Recorded: 10/06/2022 10:15 AM                 See Protocol and Disposition Below  Reason for Disposition   [1] Acting confused (e.g., disoriented, slurred speech) AND [2] brief (now gone)    Additional Information   Negative: [1] Difficult to awaken or acting confused (e.g., disoriented, slurred speech) AND [2] present now AND [3] diabetes mellitus   Negative: [1] Difficult to awaken or acting confused (e.g., disoriented, slurred speech) AND [2] present now AND [3] new-onset   Negative: [1] Weakness of the face, arm, or leg on one side of the body AND [2] new-onset   Negative: [1] Numbness of the face, arm, or leg on one side of the body AND [2] new-onset   Negative: [1] Loss of speech or garbled speech AND [2] new-onset   Negative: Difficulty breathing or bluish lips   Negative: Shock suspected (e.g., cold/pale/clammy skin, too weak to stand, low BP, rapid pulse)   Negative: Seeing, hearing, or feeling things that  are not there (i.e., visual, auditory, or tactile hallucinations)   Negative: Followed a head injury   Negative: Drug overdose suspected   Negative: Violent behavior, or threatening to physically hurt or kill someone   Negative: Sounds like a life-threatening emergency to the triager   Negative: Questions or concerns about alcohol use, unhealthy alcohol use, binge drinking, intoxication, or withdrawal   Negative: Questions or concerns about substance use (drug use), unhealthy drug use, intoxication, or withdrawal   Negative: [1] Diabetes mellitus AND [2] confusion from low blood sugar (i.e., < 60 mg/dl or 3.5 mmol/l)   Negative: Headache or vomiting   Negative: Stiff neck (can't touch chin to chest)   Negative: Very strange or paranoid behavior   Negative: Fever > 100.4 F (38.0 C)   Negative: Patient sounds very sick or weak to the triager    Protocols used: Confusion - Delirium-ADULT-AH      Andi Devon, RN   PCN Nurse Triage

## 2022-10-08 ENCOUNTER — Ambulatory Visit: Payer: Medicare Other | Admitting: Psychology

## 2022-10-08 ENCOUNTER — Encounter: Payer: Self-pay | Admitting: GASTROENTEROLOGY

## 2022-10-08 NOTE — Telephone Encounter (Signed)
NOV:  12/17/22  LOV:  04/30/22 (ZO:[id number] deficiency anemia due to chronic blood loss)    Assessment and Plan:  1. NASH: based on Fibroscan, she has F3 disease.  Appears stable, diet and exercise advised.    2. Gastroparesis confirmed by gastric emptying study 06/2016: she had symptomatic relief using Reglan, but we cannot continue this long term because of the risk for tardive dyskinesia.  Overall she is managing fine with zofran as needed for nausea.   3. Carcinoid: no evidence of residual disease on serial EGD.  4. Iron deficiency anemia: EGD, colonoscopy, and capsule have been previously.  Repeat EGD has been ordered given reported "dark stools", iron infusions.    Routing to Provider for review.    Kem Kays, RN, BSN  Ambulatory Patient Care Resources  Please route all responses to P MTGIC TRIAGE , do not route to me personally. I am a Hospital doctor.

## 2022-10-08 NOTE — Telephone Encounter (Signed)
From: Paula Daugherty  To: Paula Daugherty  Sent: 10/06/2022 9:23 PM PDT  Subject: Stroke     Hi Dr. On June 1st I had a stroke. Good news it was mild. My question is they ordered Plavix and 325 mg of Aspirin daily. It says that Plavix can cause bleeding issues and I wanted ensure that you will be ok with me taking this med. I didn't go to the Dr on Saturday but I went to [facility] in [place] this morning. I wanted you in the loop and always look for your guidance. Thanks for any help you can share. Paula Daugherty             Hi

## 2022-10-13 NOTE — Progress Notes (Signed)
Received faxed from [facility] stating images for CT Angio Head+Neck completed on 10/06/22 was pushed. Reviewed images and archive submitted.

## 2022-10-14 ENCOUNTER — Other Ambulatory Visit: Payer: Self-pay | Admitting: Otolaryngology

## 2022-10-15 ENCOUNTER — Ambulatory Visit: Payer: Medicare Other | Admitting: Student in an Organized Health Care Education/Training Program

## 2022-10-15 VITALS — BP 147/77 | HR 54 | Temp 97.3°F | Ht 62.0 in | Wt 160.7 lb

## 2022-10-15 DIAGNOSIS — E538 Deficiency of other specified B group vitamins: Secondary | ICD-10-CM

## 2022-10-15 DIAGNOSIS — I6320 Cerebral infarction due to unspecified occlusion or stenosis of unspecified precerebral arteries: Secondary | ICD-10-CM

## 2022-10-15 DIAGNOSIS — I10 Essential (primary) hypertension: Secondary | ICD-10-CM

## 2022-10-15 DIAGNOSIS — E119 Type 2 diabetes mellitus without complications: Secondary | ICD-10-CM

## 2022-10-15 MED ORDER — ASPIRIN 81 MG TABLET,DELAYED RELEASE
81.0000 mg | DELAYED_RELEASE_TABLET | Freq: Every day | ORAL | 3 refills | Status: DC
Start: 2022-10-15 — End: 2023-03-13

## 2022-10-15 MED ORDER — ATORVASTATIN 20 MG TABLET: 20.0000 mg | ORAL_TABLET | Freq: Every day | ORAL | 3 refills | Status: DC

## 2022-10-15 MED ORDER — PLAVIX 75 MG TABLET
75.0000 mg | ORAL_TABLET | Freq: Every day | ORAL | 3 refills | Status: DC
Start: 2022-10-15 — End: 2022-10-27

## 2022-10-15 MED ORDER — LOSARTAN 50 MG TABLET
50.0000 mg | ORAL_TABLET | Freq: Every day | ORAL | 3 refills | Status: DC
Start: 2022-10-15 — End: 2022-11-14

## 2022-10-15 MED ORDER — FOLIC ACID 1 MG TABLET
1.0000 mg | ORAL_TABLET | Freq: Every day | ORAL | 3 refills | Status: DC
Start: 2022-10-15 — End: 2023-03-13

## 2022-10-15 NOTE — Progress Notes (Signed)
[name]     Chief Complaint   Patient presents with    Follow-up     [facility] ER follow up         ASSESSMENT and PLAN:  1. Cerebrovascular accident (CVA) due to stenosis of precerebral artery (HCC)  New, reviewed ER notes from [facility] Jun 3.  CTA and CT neck showed mild stenosis of L M2. MRI not possible due to metal plates in ears.  Reviewed next steps.  Continue DAPT for at least 90 days, follow up with stroke clinic.   - increase statin from 10 mg to 20 mg   - Aspirin 81 mg EC Tablet; Take 1 tablet by mouth every morning.  Dispense: 90 tablet; Refill: 3  - PLAVIX 75 mg Tablet; Take 1 tablet by mouth every morning.  Dispense: 90 tablet; Refill: 3  - ECHOCARDIOGRAM COMPLETE; Future  - Neurology Clinic Referral    2. Primary hypertension  Established, not controlled. Increase losartan. Return in 4-6 weeks. Consider increase again if indicated.   - Losartan (COZAAR) 50 mg Tablet; Take 1 tablet by mouth every day.  Dispense: 90 tablet; Refill: 3    3. Type 2 diabetes mellitus without complication, without long-term current use of insulin (HCC)  Established, stable. Increase lipitor for above.   - Atorvastatin (LIPITOR) 20 mg Tablet; Take 1 tablet by mouth every day at bedtime.  Dispense: 90 tablet; Refill: 3    4. Folate deficiency  Established, stable. Refill provided.   - Folic Acid 1 mg Tablet; Take 1 tablet by mouth every day.  Dispense: 90 tablet; Refill: 3    SUBJECTIVE:  Paula Daugherty is a 77yr old female  here for chief complaint noted above.    She had a stroke on June 1st  Struggling with memory and to find the right words   Takes longer than normal - but getting better  Feeling tired   Blood pressure has been higher than normal   Feels unstable      Arm from elbow to finger tips went numb, face and tongue also numb   Talking word salad -words didn't make sense   Was at daughter's house at the time   Went to ER - [facility] - diagnosed with CVA   Had  slurring of the speech   Not able to use MRI due to metal pieces in her ears bilaterally for otosclerosis   Carotid Dopplers unremarkable.     CTA head: focal stenosis in Left M2, likely due to atherosclerosis     Blood pressure at home: ranging 150-180 systolic and 90 diastolic   Above 161 systolic on average     Reviewed and updated as appropriateI reviewed and appropriately updated her lists of current problems, past medical/surgical history, and social history. :     ROS:  As noted in HPI. 10 point review of systems was otherwise negative.     Current Outpatient Medications   Medication Sig    Albuterol (PROAIR HFA, PROVENTIL HFA, VENTOLIN HFA) 90 mcg/actuation inhaler INHALE 1 TO 2 PUFFS BY MOUTH EVERY 6 HOURS AS NEEDED FOR WHEEZING    Aspirin 81 mg EC Tablet Take 1 tablet by mouth every morning.    Atorvastatin (LIPITOR) 20 mg Tablet Take 1 tablet by mouth every day at bedtime.    Blood Glucose Meter (ONETOUCH ULTRA2 METER) Kit 1 kit one time.    CloNIDine (CATAPRES) 0.1 mg Tablet Take 2  tablets by mouth every day at bedtime. Indications: "change of life" signs    Folic Acid 1 mg Tablet Take 1 tablet by mouth every day.    Hydrocortisone (ANUSOL-HC) 25 mg Suppository Insert 1 suppository into the rectum every 12 hours.    Losartan (COZAAR) 50 mg Tablet Take 1 tablet by mouth every day.    Metformin (GLUCOPHAGE) 500 mg Tablet Take 2 tablets by mouth once daily with a meal.    Metoclopramide (REGLAN) 5 mg Tablet Take 1 tablet by mouth once daily if needed (for nausea).    NYSTATIN 100,000 unit/gram Powder Apply a thin layer of powder to affected area 4 times a day as needed    Ondansetron (ZOFRAN-ODT) 8 mg disintegrating tablet Take 1 tablet by mouth every 8 hours if needed.    Oxybutynin (DITROPAN XL) 5 mg SR Tablet Take 1 tablet by mouth every day.    PLAVIX 75 mg Tablet Take 1 tablet by mouth every morning.     No current facility-administered medications for this visit.        Allergies as of 10/15/2022 -  Reviewed 10/15/2022   Allergen Reaction Noted    Sulfa (sulfonamide antibiotics) Rash 07/11/2013    Contrast dye [radiopaque agent] Hives 07/11/2013    Hydrochlorothiazide Other-Reaction in Comments 07/11/2013    Januvia [sitagliptin] Other-Reaction in Comments 07/11/2013    Keflex [cephalexin] Abdominal Pain 07/12/2020    Lyrica [pregabalin] Other-Reaction in Comments 07/11/2013    Omnipaque [iohexol] Other-Reaction in Comments 07/11/2013    Prozac [fluoxetine hcl] Other-Reaction in Comments 07/11/2013    Topiramate Other-Reaction in Comments 07/11/2013        OBJECTIVE:   BP (!) 147/77 (SITE: left arm, Orthostatic Position: sitting, Cuff Size: regular)   Pulse 54   Temp 36.3 C (97.3 F) (Temporal)   Ht 1.575 m (5\' 2" )   Wt 72.9 kg (160 lb 11.5 oz)   LMP 05/06/1983   SpO2 98%   BMI 29.40 kg/m    Gen: NAD, appears well   HEENT: EOMI, sclera clear, anicteric TM's are clear b/l, normal light reflex, nares are patent, no edema, non erythematous.  Throat clear, MMM, no tonsillar exudate.     Neck: trachea midline, no thyromegaly   Chest: CTAB, no wheezing   CV: RRR, no murmurs   Extrem: no cyanosis or edema, pulses palpable   Skin: no rashes or lesions   Neuro: at baseline - symmetrical face, no slurring of speech, strength 5/5 in the UE, grip intact, sensation intact   Psych: normal mood and affect          Hemoglobin   Date Value Ref Range Status   09/18/2022 11.3 (L) 12.0 - 16.0 g/dL Final   16/02/9603 54.0 (L) 12.0 - 16.0 g/dL Final     Hgb J8J   Date Value Ref Range Status   09/09/2022 6.4 (H) 3.9 - 5.6 % Final     Comment:     5.7 - 6.4% indicates an increased risk for diabetes.  >6.4% is a criterion for the diagnosis of diabetes.    This boronate affinity Hb A1c method provides accurate analytical results in the presence of nearly all hemoglobin variants. Hb F higher than 15% of total Hb may yield falsely low results. Conditions that shorten red cell survival, such as the presence of unstable hemoglobins  like Hb SS, Hb CC and Hb SC, or other causes of hemolytic anemia may yield falsely low results. Iron deficiency anemia may yield  falsely high results.   06/09/2022 5.8 (H) 3.9 - 5.6 % Final     Comment:     5.7 - 6.4% indicates an increased risk for diabetes.  >6.4% is a criterion for the diagnosis of diabetes.    This boronate affinity Hb A1c method provides accurate analytical results in the presence of nearly all hemoglobin variants. Hb F higher than 15% of total Hb may yield falsely low results. Conditions that shorten red cell survival, such as the presence of unstable hemoglobins like Hb SS, Hb CC and Hb SC, or other causes of hemolytic anemia may yield falsely low results. Iron deficiency anemia may yield falsely high results.     Thyroid Stimulating Hormone   Date Value Ref Range Status   09/18/2022 1.29 0.27 - 4.20 uIU/mL Final   05/07/2021 1.02 0.27 - 4.20 uIU/mL Final     Cholesterol   Date Value Ref Range Status   02/10/2022 143 <200 mg/dL Final   16/02/9603 540 <200 mg/dL Final     LDL Cholesterol Calculation   Date Value Ref Range Status   02/10/2022 44 <100 mg/dL Final   98/03/9146 59 <829 mg/dL Final     Creatinine Serum   Date Value Ref Range Status   09/18/2022 1.14 0.51 - 1.17 mg/dL Final   56/21/3086 5.78 0.51 - 1.17 mg/dL Final            4/69/6295     8:33 AM   PHQ-2   Interest 3   Feeling 2   PHQ-2 Total Score 5     No question data found.     1. Little interest or pleasure in doing things: 3  2. Feeling down, depressed or hopeless: 2  3. Trouble falling or staying asleep, or sleeping too much: 1  4. Feeling tired or having little energy: 3  5. Poor appetite or overeating: 2  6. Feeling bad about yourself - or that you are a failure or have let yourself or your family down: 1  7. Trouble concentrating on things such as reading the paper or watching television: 0  8. Moving or speaking so slowly that other people could have noticed?  Or the opposite - being so fidgety or restless that you have  been moving around more than usual: 0  9. Thoughts that you would be better off dead or of hurting yourself in some way: 0  If >  1: Please notify a mental health provider or the patients primary care provider with any concerns about acute risk for suicide.      PHQ-9 Score: (!) 12     PHQ9 Affective-Cognitive Total: 6/15  PHQ9 Physical Total: 6/12        Total Score Depression Severity   1-4 Minimal Depression   5-9 Mild Depression   10-14 Moderate Depression   15-19 Moderately Severe Depression   20-27 Severe Depression       Depression Treatment Plan:  see assessment/plan section of note    No follow-ups on file.    I did review patient's past medical and family/social history, no changes noted.  Barriers to Learning assessed: none. Patient verbalizes understanding of teaching and instructions.  Education Needs Identified?   no      Patient was counseled specifically on the following: diagnostic results, impressions, and/or recommended diagnostic studies reviewed, instructions for management and/or follow-up, risk factor reduction and patient and family education    I spent 20 minutes with the patient, >50% of which  were spent counseling her regarding differential, additional testing, if indicated, potential treatment options, and lifestyle modifications, if applicable    Parts of this note may have been created with the support of Dragon dictation.  Please accept any grammatical, sound-alike, or syntax errors as likely dictation errors.    Electronically signed by:  Vassie Moselle, MD  Associate Physician  [facility]

## 2022-10-15 NOTE — Patient Instructions (Signed)
Thank you for letting me participate in your care.  It was very nice to see you today!    Please go to the pharmacy to pick up your medication. Take as prescribed   If you are checking into lab or x-ray please check into the FRONT DESK first.    If you are not getting your labs done today, you can come in any time during business hours and walk into lab. X-Ray on site also takes walk ins.    IMPORTANT THINGS TO KNOW:   If you had a referral placed today, please give it 10 to 14 business days before calling about the referral as that is the down time it takes for the insurance and referral department to go through the process.    Mychart messages, results, and prescription refills may take up to 72 hours for a response.   Thank you for your patience.    Please call if you have any questions or concerns. Our phone number is [phone]    Follow up as discussed.     FYI:  A new federal rule requires test results to be released to you immediately, even if your provider has not yet had a chance to review and interpret them for you. Your care team may need up to about 3 business days to review your results and explain them, if needed. We appreciate your understanding.     Dr. Arben Packman  Family Medicine Physician   [facility] [place]

## 2022-10-15 NOTE — Nursing Note (Signed)
Vital signs taken, allergies verified, screened for pain, tobacco hx verified.  Alaze Garverick Morales Juarez, MA I     I have asked the patient if they have received any medical treatment or consultation outside of the [facility].  The patient has indicated (yes or no) to receiving consults or services outside the [facility] system.  If yes, we have requested information to ascertain these results.     Patient WAS wearing a surgical mask  Contact precautions were followed when caring for the patient.

## 2022-10-16 ENCOUNTER — Encounter: Payer: Self-pay | Admitting: Otolaryngology

## 2022-10-16 ENCOUNTER — Ambulatory Visit: Payer: Medicare Other | Attending: Otolaryngology | Admitting: Otolaryngology

## 2022-10-16 VITALS — BP 182/93 | HR 60 | Temp 96.9°F | Ht 62.0 in | Wt 161.6 lb

## 2022-10-16 DIAGNOSIS — I6320 Cerebral infarction due to unspecified occlusion or stenosis of unspecified precerebral arteries: Secondary | ICD-10-CM | POA: Insufficient documentation

## 2022-10-16 DIAGNOSIS — H903 Sensorineural hearing loss, bilateral: Secondary | ICD-10-CM | POA: Insufficient documentation

## 2022-10-16 DIAGNOSIS — H906 Mixed conductive and sensorineural hearing loss, bilateral: Secondary | ICD-10-CM | POA: Insufficient documentation

## 2022-10-16 DIAGNOSIS — H8093 Unspecified otosclerosis, bilateral: Secondary | ICD-10-CM | POA: Insufficient documentation

## 2022-10-16 DIAGNOSIS — H7292 Unspecified perforation of tympanic membrane, left ear: Secondary | ICD-10-CM

## 2022-10-16 NOTE — Nursing Note (Signed)
Arrived with spouse. Vital signs taken, allergies verified, screened for pain, medication hx taken.   Age appropriate, interactive and cooperative. No acute distress noted at this time.   Contact precautions were followed when caring for the patient.       Culley Hedeen, MA.   Medical Assistant II

## 2022-10-16 NOTE — Progress Notes (Signed)
Patient: Paula Daugherty  Location: Otolaryngology Clinic  Medical Record Number: [id number] Sex: female Age: 61yr  Date of Service: 10/16/2022 Date of Birth: [birth year]    CHIEF COMPLAINT:  BILATERAL otosclerosis, LEFT TM perforation     HISTORY OF PRESENT ILLNESS:  Paula Daugherty is a 77yr old female with BILATERAL otosclerosis, LEFT TM perforation, s/p:  - BILATERAL Stapedectomies, House wire prosthesis (left in 1970s, right in 1980s at [facility])  - LEFT paper patch Mikael Spray) - 2009     INITIAL CONSULTATION 09/12/13:  Pt presenting with a c/o R otalgia. Patient reports 2-3 week intermittent, fleeting, sharp shooting R-sided otalgia with pain sometimes radiating to jaw line. Has experienced same pain on L side x2-3 days. Denies otorrhea/fever/HA. Denies significant change in hearing since onset of pain. Denies pain with opening/closing jaw. Patient endorses h/o GERD.  History of bilateral otosclerosis and bilateral stapedectomies with House wire prosthesis (left in 1970s, right in 1980s at [facility]), known left chronic ear perforation (hx of unsuccessful Paper patch placed in Lt TM 2009).      2019:  She was previously evaluated by Dr. Trisha Mangle in 2015 for right otalgia. Otalgia is improved with improved TMJ symptoms. She presents today with worsening word discrimination in the left ear. Otherwise, she has no symptoms. She denies drainage from either ear or vertigo. She does have intermittent high pitched tinnitus in both ears.      2022:  She feels like left hearing and speech discrimination continues to decline. She has trialed hearing aids in the past, most recently 5-6 years ago. No d/d/i/v            2023:  - got HAs from [facility] and has been very happy with them  - no otorrhea in either ear or left ear, no pain  - HAD noted wax and rec ENT f/u; PMD rec cerumen removal gtts, which pt used once on left but had significant pain and dysguesia so stopped;   - no further drainage, pain, bleeding; taste  returned to normal    2024:  - had stroke last week, is on Plavix/ASA  - ow doing well, HAs working well bilaterally  - hearing stable bilaterally, no changes in hearing  - no otorrhea, no otalgia       PHYSICAL EXAMINATION:  General: The patient appears well, is in no distress. Pleasant and cooperative.  Psychiatric: Mood and affect are stable. Oriented to person, place, time and condition.   Dermatologic: Skin warm and dry.  Neurologic: Gait and station normal. Facial nerve intact and symmetric bilaterally. Gaze is conjugate.  Head: Normocephalic, grossly normal appearance. No obvious external lesions.   Respiratory: Breathing comfortably, no shortness of breath or tachypnea. No excessive use of accessory muscles of respiration. No audible stridor or wheezing.  Facial Nerve: intact and symmetrical all branches bilaterally      Right ear:  Auricle normal. EAC clear, TM clear.   Left ear:  Auricle normal. EAC clear, TM clear.   Binocular otomicroscopy was indicated to better visualize and evaluate the ears bilaterally given the patient's history and physical exam findings.  See binocular otomicroscopy examination findings below.     PROCEDURE:  Bilateral ear examination under binocular otomicroscopy:  Right ear:  EAC clear. Tympanic membrane clear. Wire loop visible under posterior/superior quadrant. Mobile on positive/negative pneumatic otoscopy.   Left ear:  EAC clear. Posterior marginal perforation 5-10% with clean edges and no skin ingrowth. Stable from 2015 exam. Wire loop  prosthesis seen in posterior/superior quadrant.           AUDIOGRAM:  I personally reviewed audiometry from 09/12/13 and find the following results:          AUDIOGRAM:  I personally reviewed audiometry from 02/25/18 and find the following results:          AUDIOGRAM:  I personally reviewed audiometry from 08/02/20 and find the following results:          AUDIOGRAM:  I personally reviewed audiometry from 01/15/21 and find the following  results:      AUDIOGRAM:  I personally reviewed audiometry from 02/19/22 and find the following results:      AUDIOGRAM:  I personally reviewed audiometry from 04/10/22 and find the following results:               Impression  IMPRESSION:  BILATERAL otosclerosis  - s/p bilateral stapedectomies with House wire prosthesis (left in 1970s, right in 1980s at [facility])  - good closure of air bone gaps  - Worsening Left Sided word discrimination from 2015  - Audios stable since 2019, stable 2023  LEFT posterior marginal 5-10% perforation with no sign of cholesteatoma ingrowth   LEFT MHL, BILATERAL HF SNHL  - HAs effective                  Recommendations  PLAN:  1. Probable diagnoses discussed with the patient.   2. Possible treatment options discussed with the patient.   3. Avoid all gtts and cerumen removal products, given chronic left perf   4. 2023 audio reviewed today, remains stable since 2019  5. If ABG widens, consider left tympanoplasty; pt agrees  6. Continue HA use and monitor for any otorrhea or infection issues; none so far  7. Annual audio and f/u  8. All questions answered.  Patient is appreciative of care in consultation today.           Total time I spent in care of this patient today (excluding time spent on other billable services) was 25 minutes.  ------------------------------      10/16/2022, 10:38 AM  Electronically Signed By:   Rosezella Florida, MD FACS  Professor  Otology, Neurotology, and [facility] Surgery  Department of Otolaryngology - Head and Neck Surgery  [facility]

## 2022-10-17 ENCOUNTER — Other Ambulatory Visit: Payer: Self-pay | Admitting: "Endocrinology

## 2022-10-17 DIAGNOSIS — E1129 Type 2 diabetes mellitus with other diabetic kidney complication: Secondary | ICD-10-CM

## 2022-10-17 MED ORDER — ONETOUCH ULTRA TEST STRIPS: ORAL_STRIP | 11 refills | Status: DC

## 2022-10-17 MED ORDER — METFORMIN 1,000 MG TABLET: 1000.0000 mg | ORAL_TABLET | Freq: Every day | ORAL | 11 refills | Status: DC

## 2022-10-20 ENCOUNTER — Other Ambulatory Visit: Payer: Self-pay | Admitting: "Endocrinology

## 2022-10-20 DIAGNOSIS — E1129 Type 2 diabetes mellitus with other diabetic kidney complication: Secondary | ICD-10-CM

## 2022-10-20 NOTE — Telephone Encounter (Signed)
Pharmacy Refill Optimization (PRO)    Request to resend prescription as a 90 day supply    Refill request has been pended to the pharmacist for review per PRO P&T Approved Protocol 10/20/2022

## 2022-10-23 ENCOUNTER — Encounter: Payer: Self-pay | Admitting: Student in an Organized Health Care Education/Training Program

## 2022-10-23 MED ORDER — METFORMIN 1,000 MG TABLET: 1000.0000 mg | ORAL_TABLET | Freq: Every day | ORAL | 3 refills | Status: DC

## 2022-10-23 NOTE — Telephone Encounter (Signed)
Refill authorized per P&T Protocol 10/23/2022

## 2022-10-24 ENCOUNTER — Ambulatory Visit: Payer: Medicare Other | Admitting: Clinical Neuropsychologist

## 2022-10-24 VITALS — BP 170/77 | HR 55 | Temp 97.5°F | Wt 159.8 lb

## 2022-10-24 DIAGNOSIS — G3184 Mild cognitive impairment, so stated: Secondary | ICD-10-CM

## 2022-10-24 DIAGNOSIS — F322 Major depressive disorder, single episode, severe without psychotic features: Secondary | ICD-10-CM

## 2022-10-24 DIAGNOSIS — E71311 Medium chain acyl CoA dehydrogenase deficiency: Secondary | ICD-10-CM

## 2022-10-24 NOTE — Nursing Note (Signed)
Patient was verified with 2 identifiers; last name and DOB. Chief complaint/reason for visit noted, allergies verified,  [facility] pharmacy verified. Medication reconciliation initiated with printed list of current medications to be reviewed; edited by the patient and reconciled with provider. Any questionnaires have been answered and entered under Screenings.    Paula Daugherty comes in ambulatory.    Paula Daugherty is accompanied in the office by DJ her husband.    Patient is in ROOM # [address], [place]
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
## 2022-10-26 NOTE — Progress Notes (Incomplete Revision)
Neuropsychological Evaluation  This report may contain sensitive information that may be misinterpreted by untrained individuals.   Any disclosure, copying, or distribution of this material is strictly prohibited without the permission of the patient.     Patient Name: Paula Daugherty  MRN: [id number]   Date of Birth: [birth year]  Age: 61yr  Race/Ethnicity: White   Level of Education: 14 years (2 AA degrees)  Occupation: Retired  Handedness: right   Date of Testing: 10/24/2022  Referral Source: Garth Bigness, *  Examination Site: [facility]     REASON FOR REFERRAL: Paula Daugherty is a 77yr old White female who was referred for a neuropsychological evaluation by her *** to assess her current cognitive, psychological, and behavioral functioning. The evaluation took place in person with PPE (procedural face mask). The following information was obtained from a clinical interview with Paula Daugherty and, with Ms. Kowalske permission, her husband, and was also supplemented by medical record review. The patient was informed of the nature and purpose of the evaluation and limits of confidentiality, provided an opportunity to ask questions, and she consented to the evaluation.    Relevant History: According to Paula Daugherty, referral for the neuropsychological evaluation was prompted after Paula Daugherty was unable to draw a clock during her annual Medicare exam on 09/18/2022. Per her memory, Paula Daugherty stated she had difficulty placing the numbers and setting the time. Beginning about 6-12 months ago, She endorsed forgetting "small things" such as dates and where she placed items. She stated this is in slight contrast from her almost perfect memory at baseline.     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 She experienced right arm numbness and tingling, right facial droop, right face and tongue numbness, word salad, and garbled speech. Symptoms lasted for 30 minutes and then largely  improved. She reportedly refused to go to the hospital until 10/06/2022. At the ED she had a Head CT wo contrast that showed "deep white matter periventricular areas of low-density" and a head angiogram that showed "mild focal stenosis of M2, likely due to atherosclerosis." Residual cognitive symptoms include word finding difficulties, memory loss for recent conversation, slowed decision making, reduced multitasking, and trouble using technology.     In terms of instrumental activities of daily living (IADLs):  Medication: Dependent - Husband has been reminding her since her stroke   Finances: Independent  Cooking: Independent  Appointments: Independent  Driving: Independent  There are no recent accidents, near accidents, or instances of becoming lost.     MEDICAL HISTORY: Paula Daugherty medical history is notable for several cerebrovascular and metabolic risk factors including diabetes (with macroalbuminuric diabetic nephropathy), hypertension, hyperlipidemia, kidney disease (stage 3), NASH, amgina pectoris, and liver fibrosis. She also had chronic pain (2-5/10), low iron requiring transfusions, and history of stomach cancer in remission since 2017 resection. Ms. Aumiller sleeps about 6 hours per night. She snores at night but does not have sleep apnea per her 07/2016 sleep study that was prompted secondary to vivid disturbing dreams, which were thought to be related to mood.     Medications:     Current Outpatient Medications on File Prior to Visit:     Albuterol (PROAIR HFA, PROVENTIL HFA, VENTOLIN HFA) 90 mcg/actuation inhaler    Aspirin 81 mg EC Tablet    Atorvastatin (LIPITOR) 20 mg Tablet    Blood Glucose Meter (ONETOUCH ULTRA2 METER) Kit    Blood Sugar Diagnostic (ONETOUCH ULTRA TEST) Strips  CloNIDine (CATAPRES) 0.1 mg Tablet    Folic Acid 1 mg Tablet    Hydrocortisone (ANUSOL-HC) 25 mg Suppository    Metformin (GLUCOPHAGE) 1,000 mg tablet    Metoclopramide (REGLAN) 5 mg Tablet    NYSTATIN 100,000 unit/gram  Powder    Ondansetron (ZOFRAN-ODT) 8 mg disintegrating tablet    Oxybutynin (DITROPAN XL) 5 mg SR Tablet     Psychiatric History: Paula Daugherty reported that     she has never had suicidal/homicidal ideation, plan, or intent.     Substance Use: Paula Daugherty reportedly has *** alcoholic drinks nightly. she denied history of alcohol abuse. she does not use tobacco or illicit substances.     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    Tobacco Use    Smoking status: Former     Current packs/day: 0.10     Average packs/day: 0.1 packs/day for 20.0 years (2.0 ttl pk-yrs)     Types: Cigarettes     Passive exposure: Never    Smokeless tobacco: Never    Tobacco comments:     quit 30 years ago   Vaping Use    Vaping status: Never Used   Substance and Sexual Activity    Alcohol use: Yes     Alcohol/week: 0.0 standard drinks of alcohol     Comment: rare    Drug use: No    Sexual activity: Yes     Partners: Male   Other Topics Concern    Not on file   Social History Narrative    Not on file     Social Determinants of Health     Financial Resource Strain: Not on file   Food Insecurity: Not on file   Transportation Needs: Not on file   Physical Activity: Not on file   Stress: Not on file   Social Connections: Not on file   Intimate Partner Violence: Not on file   Housing Stability: Not on file        Family History: Paula Daugherty family medical history is *** for memory problems. Other family history is notable for  family history includes Diabetes in her father; Heart in her mother; Non-contributory in her brother and brother.     Educational, Occupational, and Social History:   Developmental milestones: Met on time.  Academic milestones (e.g., reading, writing, spelling): Met on time   Highest level of  education: Paula Daugherty completed *** years of education. They hold a *** degree in ***.  Occupation history: Paula Daugherty worked as a ***. she retired in [employer]   Social history: Paula Daugherty has been married for *** years and has *** children.     Mental Status and Behavioral Observations: Paula Daugherty arrived to her appointment on time accompanied by her ***. Paula Daugherty appeared alert and was *** oriented. she appeared appropriately dressed and groomed. *** obvious tremor was observed. Gait was unremarkable. Spontaneous speech was fluent with *** word finding difficulties. Mood was euthymic and affect was congruent. she denied having hallucinations and delusions were not evident during the evaluation. Thought process was mostly linear and goal oriented. Insight was ***.      Factors relevant  to the validity of testing were considered. Paula Daugherty appeared to put forth a good level of effort throughout testing and her performance on an embedded measure of performance validity was within expectation. Thus the results are considered an accurate reflection of her current neurocognitive status.     Tests Administered: Beck Depression Inventory (BAI-2); Beck Anxiety Inventory; Geriatric Depression Scale; Geriatric Anxiety Inventory; [employer] (BNT); Controlled Word Association Test (COWAT); Dispensing optician; [employer]V Verbal Learning Test-Second Edition (CVLT-II); Delis-Kaplan Executive Function System (D-KEFS) Color-Word Interference (CWI); Rey Complex Figure Test (RCFT) copy; Trail Making Test, Parts A & B; Wechsler Adult Intelligence Scale-Fourth Edition (WAIS-IV) Block Design, Coding, Digit Span subtests, Wechsler Memory Scale-Fourth Edition (WMS-IV), Logical Memory and Visual Reproduction subtests; Wechsler Test of Reading (WTAR). Ms. Govea test scores are compared against non-clinical normative/control samples, most of which are age, gender, and education corrected.    The data presented are for use by  appropriately trained professionals with formal graduate level training in psychometrics and psychological assessment. This data should not be interpreted out of the context of the neuropsychological evaluation in which it was obtained.      AACN Recommended Classification (Guilmette et al., 2020)  Standard Score Percentile Classification   130 >98 Exceptionally High    120-129 91-97 Above Average   110-119 75-90 High Average   90-109 25-74 Average   80-89 9-24 Low Average   70-79 2-8 Below Average   <70 <2 Exceptionally Low      RESULTS    Premorbid (baseline) Intellectual Functioning: Based on demographics, education history (*** years), job attainment, and Ms. Krummel's performance on a measure of word reading (***), her premorbid intellectual functioning was estimated to be about ***.    Attention, Working Memory, and [employer]: When presented with a list of digits auditorally, Ms. Lassiter was able to orally recite a maximum of *** digits forward (***), *** digits backward (***), and *** digits in sequence (***). On verbal and nonverbal tests of processing speed, Paula Daugherty performed within the *** to *** range. Overall, Ms. Brei attention, working memory, and processing speed were ***.    Language: Ms. Bruch ability to spontaneously name common and uncommon objects (BNT; ***/60) was *** for her age and education. her ability to quickly recite words beginning with a given letter (F=***; A=***; S=***; 3 minutes) was *** and her ability to quickly name words belonging to a specific category (animals=***) was *** for her age and education. Overall, Ms. Bolus ***.    Visual Spatial Functioning: Ms. Mosher performance was *** on a measure of speeded visuoconstruction Aeronautical engineer). her drawings of simple 2D figures were ***. her copy of a 3D house and 3D cube were ***. her approach to copying a complex geometric figure was ***. her performance *** when the Gestalt was provided. Overall, Ms. Rockmore  visuospatial skills were ***.    Memory and Learning: Verbal memory was assessed using both the WMS-IV Logical Memory subtest and the [employer] (CVLT-II). The CVLT-II requires the patient to learn a list of 16 words across five learning trials. Across trials, Ms. Emmi recalled ***, ***, ***, ***, and *** items, which is *** for her age. she recalled *** words after a short 3-minute delay (***) and *** after a long, 20-minute delay (***). Category cue *** her performance (short delay=*** words, long delay=*** words). Recognition cues *** improve her recall (hits=***, false positives=***). The Logical Memory subtest of the WMS-IV was also administered and  requires recall of narrative information (two short stories). Ms. Klenke performance on the immediate and delayed trials was *** and ***, respectively. Recognition cues did *** improve recall (***/23; ***).    Visual memory was assessed using the WMS-IV Visual Reproduction (VR). On the VR, Ms. Blough immediate and delayed memory for simple geometric designs fell within the *** and *** ranges, respectively, for her age. her performance on a recognition trial was ***, as she accurately identified *** of the 7 designs from foils.     Overall, Ms. Denner learning and retention ***.      Executive Functions: Ms. Poyer performance on a non-verbal measures of set-shifting (like multitasking) was *** and she made *** errors.  her ability to inhibit automatic responses was *** with *** errors. When a set shifting component was added her performance was *** with *** errors. her drawing of a clock from memory was ***. On a problem solving measure requiring flexible thinking, she performed within the *** range. she completed *** of the available 6 strategies and *** error prone. Overall, Ms. Keach executive abilities were ***.    Affective Functioning: On a self-report measures, Ms. Meineke reported *** depressive and *** anxiety symptoms (GDS  = ***; GAI = ***) including ***.    Summary and Recommendations:  Ms. Duhon premorbid cognitive ability was estimated to be about ***. Compared to this estimate,     she endorsed *** depressive and *** anxiety symptoms.           In sum, Ms. Ekman cognitive evaluation was suggestive of ***. Ms. Wilhite currently meets criteria for ***. Etiology is ***.     Given the results of the current evaluation the following recommendations are made:     Medical Follow-up  Ms. Dios may benefit from post-stroke cognitive rehabilitation with a speech language pathologist.   Ms. Riehl takes Oxybutynin, Clonodine nightly and Benadryl 3/week, both of which have anticholinergic properties. As such, it is recommended that she discuss alternatives to these medications with her provider given impact on cognition.      Everyday Functioning   It is recommended that Ms. Kolman continue to receive oversight of medication and financial management.    Ms. Mazmanian is also at risk for wandering and becoming lost and should therefore continue to be accompanied when out of the house. Alternatively, Ms. Tietjen may benefit from a wayfinder app and GPS locator.   It is recommended that Ms. Paduano discontinue driving even to nearby location. Given her processing speed, executive dysfunction, and memory impairment she is at significant risk for accidents and becoming lost. Formal reporting to the [facility] is deferred to her referring provider.    Compensatory Strategies:  Utilize compensatory devices (e.g., whiteboard; notebook; calendar; smartphone).  Ms. Nickolas may find that she function optimally in the context of a highly organized, structured routine.   Remove external distractions (e.g., TV; music) when attempting to focus.  Monitor your attention lapses - Ask self if you are paying attention or keep tally of every time you notice your attention has drifted.  Take breaks when you feel overwhelmed and your concentration starts to drop.  Set a  timer for a specified amount of time to work on a task.  Only work on one activity at a time.  Pay attention to when your focus is the best - if in the morning, work on more complex tasks in the morning.  Take your time with a task.  Break complex tasks  into more manageable parts.  Set realistic goals and check the goals off as you accomplish them.    Current and Future Planning and Support  Ms. Zummo and her family would benefit from meeting with a Child psychotherapist at the [facility]. Call [phone]  [facility]'s Association   CompanyTitles.tn  Alzheimer's Association 24/7 Helpline at [phone]  The [facility] Alzheimer's chapter can be reached at [phone].   Alzheimer's Association website: (LimitLaws.hu) offers many resources for individuals with dementia and their families.   The following books may be helpful: The 36-hour Day: A Family guide to Caring for People Who Have Alzheimer's Disease, Related Dementias, and Memory Loss (2011); Alzheimer's from the Inside Out (2006).    Given reported longstanding difficulties with attention and inefficiencies observed on testing, she may benefit from:   Compartmentalizing decision-making. Executive function difficulties may interfere with your ability to make decisions and reason through complex and/or abstract situations in daily life.  Break large overwhelming decisions into small manageable problems.  Use written outlines to plan out your problem-solving strategy.  Do not go on to the next step before accomplishing the previous one.    Use external aids to increase structure in daily life. Tools such as Systems analyst with alarms and reminders are helpful in bringing structure to daily activities.   Minimize outside distractors. she should try to minimize the amount of background noise (i.e., television, radio) when having conversations with others.    Be willing to ask for information to be repeated if he  becomes distracted or does not hear or understand something during a conversation.   Take notes in a calendar or notepad that you have on your person at all times.  Allow more time to complete important tasks.  Write out steps involved in completing important tasks or planning ahead of time.   Practice active listening. Focusing attention on the speaker, ask questions, restate the information, and take notes as needed.  Repeat and paraphrase information. Test your memory shortly after learning something and then at progressively longer durations.   Divide a task into small, manageable units.  Take notes in the margins to help stay focused and to help remember information read.   she is encouraged to practice mindfulness to increase his level of awareness when he is engaging in the tasks. This will allow him to keep competing distractions to a minimum.   Given her reported inattention and attentional inefficiencies on testing, it may be helpful for her to complete an ADHD workbook such as Taking Charge of Adult ADHD by Trellis Moment independently or with the assistance of a therapist/counselor.    Apathy:  Offering and engaging the person in meaningful activities is important to ward off apathy.   Sports: The inclusion of sports in therapeutic activities have also been connected with a reduction in apathy. Sports memories often go way back to childhood and may provide a strong stimulus to fight apathy.  Reminiscing: People with dementia often struggle with loneliness and boredom, which can contribute to apathy. Taking a few minutes to sincerely chat with someone could be helpful in reducing apathy. Reminiscing can be an effective way to increase engagement and reduce apathy.  Music and Art: Research has also shown that music and art are effective ways to engage someone with dementia who appears apathetic. You will want to research what their favorite music has been throughout their life and find a recording of  these songs to play.  Medication: Although non-drug approaches are generally preferred, research has also shown some benefit from acetylcholinesterase inhibitors for improving apathy in dementia.    Irritability   Mr. Thompson's prescribing provider may consider starting him on a medication to help reduce irritability.   Consider using the DICE approach to managing behavioral symptoms: https://mccarthy-gardner.info/  Behavioral approach:   Try to identify the immediate cause. Think about what happened right before the reaction that may have triggered the behavior.  Rule out pain as the cause of the behavior. Pain can trigger aggressive behavior for a person with dementia.  Focus on feelings, not the facts. Rather than focusing on specific details, consider the person's emotions. Look for the feelings behind the words or actions.  Don't get upset. Be positive and reassuring. Speak slowly in a soft tone.  Limit distractions. Examine the person's surroundings, and adapt them to avoid similar situations.  Try a relaxing activity. Use music, massage or exercise to help soothe the person.  Shift the focus to another activity. The immediate situation or activity may have unintentionally caused the aggressive response. Try something different.  Take a break. If the person is in a safe environment and you are able, walk away and take a moment for yourself.  Ensure safety. Make sure you and the person are safe. If the person is unable to calm down, seek assistance from others. Always call 911 in emergency situations. If you do call 911, make sure to tellr esponders the person has dementia, which causes them to act aggressively.  Share your experience with others. Join [employer], our online support community and message boards, and share what response strategies have worked for you and get more ideas from other caregivers.    Following are recommendations for word finding challenges:  Delay: With a bit of extra time,  the word may pop out on its own. Be patient with yourself, and ask your partner to give you time.  Describe: Give the listener information about what the thing looks like or does. Any extra information can help them know what you're talking about. It may even help you to say the word.  Association: See if you can think of something related. Even if it's not quite right, it may prompt the word or convey the meaning.  Synonyms: Think of a word that means the same or something similar.  First Letter: Try to write or think of the first letter of the word. Scan the alphabet to see if each letter triggers anything for you.  Gesture: Use your hands or body to act out the word, like playing a game of charades. Even gesturing with your hands in a non-specific way or tapping the table may help activate the brain.  Draw: Sketch out a quick picture of what you're trying to say. You don't have to be an Tree surgeon to use drawing to communicate.  Look it Up: Think if there's somewhere the word is written down or pictured. A communication smartphone applications can be helpful for this.   Narrow it Down: Give the general topic or category. Is it a person, place, or thing? A family member or a friend? Stating the topic can help your listener predict what you might be trying to say by providing some context.  Come Back Later: If you can't think of the word and your partner can't guess, it's okay to give up for now. Our brains work out problems while we do other things, so it's possible the word will simply  pop out later. This is a last resort, so try other strategies first.    Cognitive rehabilitation workbooks for everyday attentional and executive difficulties may be helpful to work on independently or with the assistance of a therapist, counselor, or cognitive rehabilitation specialist (e.g., speech Solicitor).  The Brain Injury Workbook: Exercises for Cognitive Rehabilitation by Lavena Stanford  The Brain Injury  Rehabilitation Workbook by editors Claudie Leach, Jane Canary. Alphonzo Grieve   Practical Guide to Cognitive Rehabilitation by editors, Caryl Comes. Dilks, Godfrey Pick. Gerlean Ren     Brain Healthy Lifestyle: We encourage Ms. Canseco to continue to work with her healthcare providers to optimize his mental and physical health and reduce risks of   future cognitive difficulties. The following are recommended:    Manage a healthy diet. The Mediterranean and MIND diet have demonstrated brain health benefits.   Regularly participate in activities that are cognitively stimulating.   Engage in regular exercise for a recommended 150 minutes per week.   Engage in regular socialization with friends and family.    Repeat neuropsychological assessment to assess response to treatment and to monitor the stability of the patient's neuropsychological profile over time.        Following the assessment, *** minutes were spent reviewing results, discussing recommendations, and answering questions.     Thank you for the opportunity to evaluate this patient.     Thom Chimes, Ph.D.       Clinical Neuropsychologist  Assistant Clinical Professor  [place] License #: [id number]       SERVICE PROVIDED TOTAL Time    Clinical Interview/Neurobehavioral Status Exam (1st hour [phone])  60 minutes   - Additional hour [phone])     Neuropsych assessment including record review, clinical decision making, treatment planning, etc. by professional (1st hour [phone])  60 minutes   - Additional time (each hour [id number])  240 minutes   Neuropsych test administration and scoring by professional (first 30 minutes [id number])  30 minutes   - Additional time (each additional 30 minutes [id number])  75 minutes   Neuropsych Testing and scoring by technician (first 30 minutes [id number])    - Additional time (each 30 minutes [id number])

## 2022-10-27 ENCOUNTER — Encounter: Payer: Self-pay | Admitting: Student in an Organized Health Care Education/Training Program

## 2022-10-27 DIAGNOSIS — I6320 Cerebral infarction due to unspecified occlusion or stenosis of unspecified precerebral arteries: Secondary | ICD-10-CM

## 2022-10-27 NOTE — Telephone Encounter (Signed)
Pharmacy does not stock brand name Plavix. Patient/Pharmacy requesting generic medication. Removed DAW from pended order.

## 2022-10-27 NOTE — Telephone Encounter (Signed)
From: [name]  To: Wyatt Portela  Sent: 10/24/2022 7:19 PM PDT  Subject: Med problem     Dr., I just received a phone call from [facility]. I tried to order the Plavix that you sent on June 12th. Turns out they don't carry the proper Plavix just the generic. They called to tell me that they sent you a fax on June 14th requesting to fill it with generic. They are saying they never received a response so they are saying that I can't get the ordered meds. Now they need a new Rx if you want to ok the generic. If not I will not be taking it after next week.

## 2022-10-28 ENCOUNTER — Ambulatory Visit: Payer: Medicare Other | Admitting: Otolaryngology

## 2022-10-29 MED ORDER — CLOPIDOGREL 75 MG TABLET
75.0000 mg | ORAL_TABLET | Freq: Every day | ORAL | 3 refills | Status: DC
Start: 2022-10-29 — End: 2023-10-25

## 2022-10-29 NOTE — Telephone Encounter (Signed)
Pharmacy Refill Optimization (PRO)    Request to resend prescription  Remove DAW to ok generic fill  Original sent 10/15/22 Plavix 75mg  #90 + 3 rf  Refill authorized per P&T Protocol 10/29/2022

## 2022-10-30 ENCOUNTER — Ambulatory Visit: Payer: Medicare Other | Attending: Family Medicine

## 2022-10-30 ENCOUNTER — Encounter: Payer: Self-pay | Admitting: GASTROENTEROLOGY

## 2022-10-30 DIAGNOSIS — D500 Iron deficiency anemia secondary to blood loss (chronic): Secondary | ICD-10-CM | POA: Insufficient documentation

## 2022-10-30 LAB — CBC NO DIFFERENTIAL
Hematocrit: 37.8 % (ref 36.0–46.0)
Hemoglobin: 12.3 g/dL (ref 12.0–16.0)
MCH: 25.2 pg — ABNORMAL LOW (ref 27.0–33.0)
MCHC: 32.4 % (ref 32.0–36.0)
MCV: 77.7 fL — ABNORMAL LOW (ref 80.0–100.0)
MPV: 8.2 fL (ref 6.8–10.0)
Platelet Count: 135 10*3/uL (ref 130–400)
RDW: 19.7 % — ABNORMAL HIGH (ref 0.0–14.7)
Red Blood Cell Count: 4.86 10*6/uL (ref 4.00–5.20)
White Blood Cell Count: 5.1 10*3/uL (ref 4.5–11.0)

## 2022-10-30 LAB — FERRITIN: Ferritin: 17 ng/mL (ref 13–150)

## 2022-11-03 ENCOUNTER — Other Ambulatory Visit: Payer: Self-pay | Admitting: GASTROENTEROLOGY

## 2022-11-03 ENCOUNTER — Telehealth: Payer: Self-pay | Admitting: "Endocrinology

## 2022-11-03 DIAGNOSIS — D500 Iron deficiency anemia secondary to blood loss (chronic): Secondary | ICD-10-CM

## 2022-11-03 NOTE — Telephone Encounter (Signed)
Paperwork from [employer]- Rx for strips  was received and placed in Dr. Lovett Calender firm box on 11/03/2022 for MD to complete.    Thank You    Marcheta Grammes, MA

## 2022-11-07 NOTE — Telephone Encounter (Signed)
Received signed DWO form from Dr. Lovett Calender. Faxed to [facility] at [phone] and [phone] with confirmation of fax received.    Connye Burkitt RD, CDCES pager [phone]

## 2022-11-10 ENCOUNTER — Encounter: Payer: Self-pay | Admitting: "Endocrinology

## 2022-11-10 NOTE — Telephone Encounter (Signed)
From: [name]  To: Jed Limerick  Sent: 11/10/2022 7:03 AM PDT  Subject: Requested Blood Sugar #s    Hello Dr. I know you are out of the office right now. I am so sorry that I didn't send these sooner. I just have no energy since the stroke. I am doing ok though. Just FYI, your office called and canceled my August appointment as you will be out again. Next available was early November. We both already had appointments for November 13th so will see you then.     Take care. Paula Daugherty

## 2022-11-14 ENCOUNTER — Ambulatory Visit: Payer: Medicare Other | Admitting: Student in an Organized Health Care Education/Training Program

## 2022-11-14 VITALS — BP 146/86 | HR 72 | Temp 98.0°F | Ht 62.0 in | Wt 170.0 lb

## 2022-11-14 DIAGNOSIS — I6320 Cerebral infarction due to unspecified occlusion or stenosis of unspecified precerebral arteries: Secondary | ICD-10-CM

## 2022-11-14 DIAGNOSIS — I10 Essential (primary) hypertension: Secondary | ICD-10-CM

## 2022-11-14 DIAGNOSIS — R413 Other amnesia: Secondary | ICD-10-CM

## 2022-11-14 MED ORDER — LOSARTAN 100 MG TABLET
100.0000 mg | ORAL_TABLET | Freq: Every day | ORAL | 3 refills | Status: DC
Start: 2022-11-14 — End: 2023-09-18

## 2022-11-14 NOTE — Nursing Note (Signed)
Patient verified using 2 identifiers.  Vitals taken, screened for allergies, pain and tobacco use.  Pharmacy verified.      PPE used by provider during encounter: Surgical mask    Antione Obar, LVN

## 2022-11-14 NOTE — Progress Notes (Signed)
[name]- [place]  Family Medicine     Chief Complaint   Patient presents with    Follow-up         ASSESSMENT and PLAN:  1. Primary hypertension  Assessment: chronic, not at goal  Plan:   - Medication: losartan   - Adjustments made today: Yes, increase from 50 mg to 100 mg   - continue home monitoring, log reviewed today. Copied and scanned into chart.   - consider bringing in home cuff to compare to clinic    - counseled on lifestyle measures including diet, exercise, ETOH reduction if applicable, NA intake   - Follow up 3-4 months   - Losartan (COZAAR) 100 mg tablet; Take 1 tablet by mouth every day.  Dispense: 90 tablet; Refill: 3    2. 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 Memory deficits  Established, ongoing sequelae including memory deficits, executive functioning impairment and word finding difficulty.  Had neuropsych testing with healthy aging clinic but report not yet available.  Prior CTA and CT neck showed mild stenosis of L M2.  Continue DAPT for at least 90 days, follow up with stroke clinic as planned.   - continue aspirin 81 mg and plavix 75 mg   - continue statin 20 mg atorvastatin   - complete echo as planned   - neuro follow up in November, unclear if should be seen sooner. Patient hoping for sooner appt.     SUBJECTIVE:  Paula Daugherty is a 77yr old female  here for chief complaint noted above.    Still struggling with memory and words   Any conversation with anyone will look at husband to see if said the right thing   Struggles with ability to speak     She'll be getting echocardiogram at end of July   Seeing Neurology in November - disappointed given she has had a stroke   Still very fatigued as well   Said no massages, no grapefruit     HTN:   Blood pressure has been uncontrolled   Last visit increased losartan to 50 mg     Waiting for results for neuropsyche evaluation   Some things she did well and some she didn't   Executive  function seems to have been affected       Reviewed and updated as appropriateI reviewed and appropriately updated her lists of current problems, past medical/surgical history, and social history. :     ROS:  As noted in HPI. 10 point review of systems was otherwise negative.     Current Outpatient Medications   Medication Sig    Albuterol (PROAIR HFA, PROVENTIL HFA, VENTOLIN HFA) 90 mcg/actuation inhaler INHALE 1 TO 2 PUFFS BY MOUTH EVERY 6 HOURS AS NEEDED FOR WHEEZING    Aspirin 81 mg EC Tablet Take 1 tablet by mouth every morning.    Atorvastatin (LIPITOR) 20 mg Tablet Take 1 tablet by mouth every day at bedtime.    Blood Glucose Meter (ONETOUCH ULTRA2 METER) Kit 1 kit one time.    Blood Sugar Diagnostic (ONETOUCH ULTRA TEST) Strips Use to test blood glucose 2x/day    CloNIDine (CATAPRES) 0.1 mg Tablet Take 2 tablets by mouth every day at bedtime. Indications: "change of life" signs    Clopidogrel (PLAVIX) 75 mg Tablet Take 1 tablet by mouth every morning.    Folic Acid 1 mg Tablet Take 1 tablet by mouth every day.    Hydrocortisone (  ANUSOL-HC) 25 mg Suppository Insert 1 suppository into the rectum every 12 hours.    Losartan (COZAAR) 50 mg Tablet Take 1 tablet by mouth every day.    Metformin (GLUCOPHAGE) 1,000 mg tablet Take 1 tablet by mouth once daily with a meal.    Metoclopramide (REGLAN) 5 mg Tablet Take 1 tablet by mouth once daily if needed (for nausea).    NYSTATIN 100,000 unit/gram Powder Apply a thin layer of powder to affected area 4 times a day as needed    Ondansetron (ZOFRAN-ODT) 8 mg disintegrating tablet Take 1 tablet by mouth every 8 hours if needed.    Oxybutynin (DITROPAN XL) 5 mg SR Tablet Take 1 tablet by mouth every day.     No current facility-administered medications for this visit.        Allergies as of 11/14/2022 - Reviewed 11/14/2022   Allergen Reaction Noted    Sulfa (sulfonamide antibiotics) Rash 07/11/2013    Contrast dye [radiopaque agent] Hives 07/11/2013    Hydrochlorothiazide  Other-Reaction in Comments 07/11/2013    Januvia [sitagliptin] Other-Reaction in Comments 07/11/2013    Keflex [cephalexin] Abdominal Pain 07/12/2020    Lyrica [pregabalin] Other-Reaction in Comments 07/11/2013    Omnipaque [iohexol] Other-Reaction in Comments 07/11/2013    Prozac [fluoxetine hcl] Other-Reaction in Comments 07/11/2013    Topiramate Other-Reaction in Comments 07/11/2013        OBJECTIVE:   BP (!) 146/86 (SITE: left arm, Orthostatic Position: sitting, Cuff Size: regular)   Pulse 72   Temp 36.7 C (98 F) (Temporal)   Ht 1.575 m (5\' 2" )   Wt 77.1 kg (169 lb 15.6 oz)   LMP 05/06/1983   SpO2 97%   BMI 31.09 kg/m    Gen: NAD, appears well   HEENT: EOMI, sclera clear, anicteric   Neck: trachea midline, no thyromegaly   Chest: CTAB, no wheezing   CV: RRR, no murmurs   Extrem: no cyanosis or edema, pulses palpable   Skin: no rashes or lesions   Neuro: no focal deficits   Psych: normal mood and affect        Hemoglobin   Date Value Ref Range Status   10/30/2022 12.3 12.0 - 16.0 g/dL Final   16/02/9603 54.0 (L) 12.0 - 16.0 g/dL Final     Hgb J8J   Date Value Ref Range Status   09/09/2022 6.4 (H) 3.9 - 5.6 % Final     Comment:     5.7 - 6.4% indicates an increased risk for diabetes.  >6.4% is a criterion for the diagnosis of diabetes.    This boronate affinity Hb A1c method provides accurate analytical results in the presence of nearly all hemoglobin variants. Hb F higher than 15% of total Hb may yield falsely low results. Conditions that shorten red cell survival, such as the presence of unstable hemoglobins like Hb SS, Hb CC and Hb SC, or other causes of hemolytic anemia may yield falsely low results. Iron deficiency anemia may yield falsely high results.   06/09/2022 5.8 (H) 3.9 - 5.6 % Final     Comment:     5.7 - 6.4% indicates an increased risk for diabetes.  >6.4% is a criterion for the diagnosis of diabetes.    This boronate affinity Hb A1c method provides accurate analytical results in the  presence of nearly all hemoglobin variants. Hb F higher than 15% of total Hb may yield falsely low results. Conditions that shorten red cell survival, such as the presence of  unstable hemoglobins like Hb SS, Hb CC and Hb SC, or other causes of hemolytic anemia may yield falsely low results. Iron deficiency anemia may yield falsely high results.     Thyroid Stimulating Hormone   Date Value Ref Range Status   09/18/2022 1.29 0.27 - 4.20 uIU/mL Final   05/07/2021 1.02 0.27 - 4.20 uIU/mL Final     Cholesterol   Date Value Ref Range Status   02/10/2022 143 <200 mg/dL Final   81/19/1478 295 <200 mg/dL Final     LDL Cholesterol Calculation   Date Value Ref Range Status   02/10/2022 44 <100 mg/dL Final   62/13/0865 59 <784 mg/dL Final     Creatinine Serum   Date Value Ref Range Status   09/18/2022 1.14 0.51 - 1.17 mg/dL Final   69/62/9528 4.13 0.51 - 1.17 mg/dL Final            2/44/0102     8:33 AM   PHQ-2   Interest 3   Feeling 2   PHQ-2 Total Score 5     No question data found.           If >  1: Please notify a mental health provider or the patients primary care provider with any concerns about acute risk for suicide.             /15   /12        Total Score Depression Severity   1-4 Minimal Depression   5-9 Mild Depression   10-14 Moderate Depression   15-19 Moderately Severe Depression   20-27 Severe Depression       Depression Treatment Plan:  see assessment/plan section of note    No follow-ups on file.    I did review patient's past medical and family/social history, no changes noted.  Barriers to Learning assessed: none. Patient verbalizes understanding of teaching and instructions.  Education Needs Identified?   no      Patient was counseled specifically on the following: diagnostic results, impressions, and/or recommended diagnostic studies reviewed, instructions for management and/or follow-up, risk factor reduction and patient and family education    I spent 40 minutes with the patient, >50% of which were  spent counseling her regarding differential, additional testing, if indicated, potential treatment options, and lifestyle modifications, if applicable    Parts of this note may have been created with the support of Dragon dictation.  Please accept any grammatical, sound-alike, or syntax errors as likely dictation errors.    Electronically signed by:  Wyatt Portela, MD  Associate Physician  [facility] - [facility]

## 2022-11-18 NOTE — Telephone Assessment (Signed)
Paperwork from [employer] for diabetic standard written order (dispense all diabetes testing supplies) was completed by MD and  Faxed to [phone] and [phone] on 11-18-22.    Leonie Green MA

## 2022-11-28 ENCOUNTER — Ambulatory Visit
Admission: RE | Admit: 2022-11-28 | Discharge: 2022-11-28 | Disposition: A | Discharge status: Home / Self Care | Payer: Medicare Other | Source: Ambulatory Visit

## 2022-11-28 DIAGNOSIS — I517 Cardiomegaly: Secondary | ICD-10-CM | POA: Insufficient documentation

## 2022-11-28 DIAGNOSIS — I6320 Cerebral infarction due to unspecified occlusion or stenosis of unspecified precerebral arteries: Secondary | ICD-10-CM | POA: Insufficient documentation

## 2022-11-28 LAB — ECHOCARDIOGRAM COMPLETE
IVSD 2D: 1.15 cm (ref 0.6–0.9)
LEFT INTERNAL DIMENSION IN SYSTOLE: 2.87 cm
LEFT VENTRICULAR INTERNAL DIMENSION IN DIASTOLE: 4.73 cm (ref 3.8–5.2)
POSTERIOR WALL: 1.16 cm (ref 0.6–0.9)
TAPSE: 1.75 cm

## 2022-12-02 ENCOUNTER — Encounter: Payer: Self-pay | Admitting: Obstetrics & Gynecology

## 2022-12-02 ENCOUNTER — Ambulatory Visit: Payer: Medicare Other | Admitting: Obstetrics & Gynecology

## 2022-12-02 VITALS — BP 134/75 | HR 62 | Wt 157.8 lb

## 2022-12-02 DIAGNOSIS — N83202 Unspecified ovarian cyst, left side: Secondary | ICD-10-CM

## 2022-12-02 DIAGNOSIS — N951 Menopausal and female climacteric states: Secondary | ICD-10-CM

## 2022-12-02 NOTE — Progress Notes (Unsigned)
Verified identification with name and DOB. Vitals taken and screened for pain.  M. Melendez Sr. LVN

## 2022-12-02 NOTE — Progress Notes (Unsigned)
Gyn Note    Chief Complaint   Patient presents with    Follow Up With Specialist     Med follow up/US follow up         ASSESSMENT & PLAN      Diagnoses and all orders for this visit:  Cyst of left ovary  -     US PELVIS, TRANSVAG + US PELVIS, TRANSABD, NON-OB, COMPLETE  Vasomotor symptoms due to menopause      - known cyst, prev noted to be stable. Asx. Plan to repeat US if develops sx's.      - HF in MP- well controlled with clonidine 0.2mg , has refills on recent order. Rev that not a candidate for estradiol, esp now with recent CVA, but that was never of interest to her. BP ok with high clonidine dose and is comfortable.   --- if needed, in future consider effexor vs gabapentin prn (no prozac)       RTC 1 yr for med check in      SUBJECTIVE     Paula Daugherty is a 68yr G1 P1. She had concerns including Follow Up With Specialist (Med follow up/US follow up).    Here for cyst FU.   Had episode of some cramping that resolved, presumed related to bowel changes which are up and down. HF sx's are still better with incr clonidine dose.     MP @: 1985  BCM/ HT- no  H/o abnl pap- no  H/o STIs- no  Sexually active- female, not currently  G1 P1 - svd x1    Past gyn problems:   -  left ov cyst, stable  - h/o hysterectomy, open, states it was done for pain.  Does not recall if they found endometriosis or not.     OB History   Gravida Para Term Preterm AB Living   1 1       1    SAB IAB Ectopic Multiple Live Births                  # Outcome Date GA Lbr Len/2nd Weight Sex Type Anes PTL Lv   1 Para                - PMH, PSH, FH, SH reviewed in chart.         OBJECTIVE       BP 134/75 (SITE: left arm, Orthostatic Position: sitting, Cuff Size: regular)   Pulse 62   Wt 71.6 kg (157 lb 13.6 oz)   LMP 05/06/1983   SpO2 98%   BMI 28.87 kg/m       Gen- NAD        Total time I spent in care of this patient today (excluding time spent on other billable services) was 25 minutes.       Marthenia Rolling, MD   Assistant Clinical  Professor  Obstetrics/Gynecology  PI# [phone]

## 2022-12-03 ENCOUNTER — Encounter: Payer: Self-pay | Admitting: Obstetrics & Gynecology

## 2022-12-10 ENCOUNTER — Ambulatory Visit: Payer: Medicare Other | Admitting: "Endocrinology

## 2022-12-16 ENCOUNTER — Ambulatory Visit: Payer: Medicare Other | Admitting: "Endocrinology

## 2022-12-17 ENCOUNTER — Encounter: Payer: Self-pay | Admitting: GASTROENTEROLOGY

## 2022-12-17 ENCOUNTER — Ambulatory Visit: Payer: Medicare Other | Attending: GASTROENTEROLOGY | Admitting: GASTROENTEROLOGY

## 2022-12-17 VITALS — BP 160/78 | HR 67 | Temp 98.3°F | Resp 16 | Ht 62.0 in | Wt 156.7 lb

## 2022-12-17 DIAGNOSIS — D500 Iron deficiency anemia secondary to blood loss (chronic): Secondary | ICD-10-CM | POA: Insufficient documentation

## 2022-12-17 DIAGNOSIS — D509 Iron deficiency anemia, unspecified: Secondary | ICD-10-CM | POA: Insufficient documentation

## 2022-12-17 DIAGNOSIS — K3184 Gastroparesis: Secondary | ICD-10-CM

## 2022-12-17 DIAGNOSIS — K7581 Nonalcoholic steatohepatitis (NASH): Secondary | ICD-10-CM | POA: Insufficient documentation

## 2022-12-17 NOTE — Progress Notes (Signed)
Gastroenterology and Hepatology Outpatient Progress Note    Patient Name: Paula Daugherty  MRN: [id number]  Date of Service: 12/17/2022   Referring Provider: Johny Drilling, MD  Attending Physician: Bartholomew Crews MD, MPH    REASON FOR CONSULTATION: NASH and gastroparesis    SUBJECTIVE:  Paula Daugherty is a 77 yr old female with history of DM and obesity BMI 32.  She has fatty liver seen on [place] 10/2015 which showed fat in her liver without splenomegaly and a liver fibrosis score of 12.9 (F3) suggestive of severe fibrosis.  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  She had chronic nausea and vomiting recently diagnosed with gastroparesis on GES.  She has had symptomatic relief from Reglan.  She is also managing with zofran as needed for nausea.    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  This is the likely cause of her iron deficiency.    Computed MELD 3.0 unavailable. One or more values for this score either were not found within the given timeframe or did not fit some other criterion.  Computed MELD-Na unavailable. One or more values for this score either were not found within the given timeframe or did not fit some other criterion.        EGD and Colonoscopy 04/2021:    A.        DUODENUM, 2ND PORTION, TATTOOED AREA (BIOPSY):  -   Duodenal mucosa with normal villous architecture and no increased intraepithelial lymphocytes  -   Negative for malignancy              B.        COLON, ASCENDING, POLYP X 2 ( POLYPECTOMY ):  -   Tubular adenoma (2)     C.        COLON, DESCENDING, "POLYP X 2" ( POLYPECTOMY ):  -   One fragment of tubular adenoma (see comment)              D.        RECTUM, POLYP ( POLYPECTOMY ):  -   Hyperplastic polyp     COMMENT: Part C, one fragment of tissue does not survive tissue processing.      Capsule 12/2021: normal.    Reports some dark stools, Hgb decreased from 11.8 to 7.8 to 7.2.  Ferritin 7.  EGD has been ordered.  Cites numerous  social stressors with family and the holidays.    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    Psychiatric illness        ALLERGIES:    Sulfa (Sulfonamide Antibiotics)    Rash  Contrast Dye [Radiopaque Agent]    Hives  Hydrochlorothiazide    Other-Reaction in Comments    Comment:Patient reports  Januvia [Sitagliptin]    Other-Reaction in Comments    Comment:Patient reports  Keflex [Cephalexin]    Abdominal Pain  Lyrica [Pregabalin]    Other-Reaction in Comments    Comment:Patient reports  Omnipaque [Iohexol]    Other-Reaction in Comments    Comment:Patient reports  Prozac [Fluoxetine Hcl]    Other-Reaction in Comments    Comment:Patient reports  Topiramate    Other-Reaction in Comments    Comment:Hairfall    MEDICATIONS:  Current Outpatient Medications on File Prior to Visit   Medication Sig Dispense Refill    Albuterol (PROAIR HFA, PROVENTIL HFA, VENTOLIN HFA) 90 mcg/actuation inhaler INHALE 1 TO 2 PUFFS BY MOUTH EVERY 6 HOURS AS NEEDED FOR WHEEZING 8.5 g 0    Aspirin 81 mg EC Tablet Take 1 tablet by mouth every morning. 90 tablet 3    Atorvastatin (LIPITOR) 20 mg Tablet Take 1 tablet by mouth every day at bedtime. 90 tablet 3    Blood Glucose Meter (ONETOUCH ULTRA2 METER) Kit 1 kit one time. 1 kit 0    Blood Sugar Diagnostic (ONETOUCH ULTRA TEST) Strips Use to test blood glucose 2x/day 100 strip 11    CloNIDine (CATAPRES) 0.1 mg Tablet Take 2 tablets by mouth every day at bedtime. Indications: "change of life" signs 180 tablet 3    Clopidogrel (PLAVIX) 75 mg Tablet Take 1 tablet by mouth every morning. 90 tablet 3    Folic Acid 1 mg  Tablet Take 1 tablet by mouth every day. 90 tablet 3    Hydrocortisone (ANUSOL-HC) 25 mg Suppository Insert 1 suppository into the rectum every 12 hours. 12 suppository 0    Losartan (COZAAR) 100 mg tablet Take 1 tablet by mouth every day. 90 tablet 3    Metformin (GLUCOPHAGE) 1,000 mg tablet Take 1 tablet by mouth once daily with a meal. 90 tablet 3    Metoclopramide (REGLAN) 5 mg Tablet Take 1 tablet by mouth once daily if needed (for nausea).      NYSTATIN 100,000 unit/gram Powder Apply a thin layer of powder to affected area 4 times a day as needed 60 g 1    Ondansetron (ZOFRAN-ODT) 8 mg disintegrating tablet Take 1 tablet by mouth every 8 hours if needed. 100 tablet 1    Oxybutynin (DITROPAN XL) 5 mg SR Tablet Take 1 tablet by mouth every day. 90 tablet 3     No current facility-administered medications on file prior to visit.       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  GI: vomiting, nausea, rectal bleeding.    PHYSICAL EXAMINATION:  Temp: 36.8 C (98.3 F) (08/14 1044)  Temp src: Temporal (08/14 1044)  Pulse: 67 (08/14 1044)  BP: 160/78 (08/14 1052)  Resp:  16 (08/14 1044)  SpO2: 100 % (08/14 1044)  Height: 157.5 cm (5\' 2" ) (08/14 1044)  Weight: 71.1 kg (156 lb 12 oz) (08/14 1044)    GENERAL: well appearing, well nourished.    LABORATORY EXAMINATIONS:   Lab Results   Lab Name Value Date/Time    NA 144 09/18/2022 09:41 AM    NA 142 08/30/2015 08:35 AM    K 4.6 09/18/2022 09:41 AM    K 4.0 08/30/2015 08:35 AM    CL 107 09/18/2022 09:41 AM    CL 104 08/30/2015 08:35 AM    CO2 25 09/18/2022 09:41 AM    CO2 22 (L) 08/30/2015 08:35 AM    BUN 30 (H) 09/18/2022 09:41 AM    BUN 19 08/30/2015 08:35 AM    CR 1.14 09/18/2022 09:41 AM    CR 0.88 08/30/2015 08:35 AM    GLU 135 (H) 09/18/2022 09:41 AM    GLU 139 (H) 08/30/2015 08:35 AM     Lab Results   Lab Name Value Date/Time    WBC 5.1 10/30/2022 08:39 AM    WBC 8.9 08/30/2015 08:35 AM    HGB 12.3 10/30/2022 08:39 AM    HGB 14.4 08/30/2015 08:35 AM    HCT 37.8 10/30/2022 08:39 AM    HCT 44.6 08/30/2015 08:35 AM    PLT 135 10/30/2022 08:39 AM    PLT 217 08/30/2015 08:35 AM     Lab Results   Lab Name Value Date/Time    AST 24 09/18/2022 09:41 AM    AST 71 (H) 08/30/2015 08:35 AM    ALT 25 09/18/2022 09:41 AM    ALT 69 (H) 08/30/2015 08:35 AM    ALP 96 09/18/2022 09:41 AM    ALP 104 08/30/2015 08:35 AM    ALB 4.5 09/18/2022 09:41 AM    ALB 4.2 08/30/2015 08:35 AM    TP 8.2 09/18/2022 09:41 AM    TP 8.5 (H) 08/30/2015 08:35 AM    TBIL 0.9 09/18/2022 09:41 AM    TBIL 0.9 08/30/2015 08:35 AM     Lab Results   Component Value Date    INR 1.13 06/13/2022     Lab Results   Component Value Date    VITD25 17.3 09/18/2022       VIRAL SEROLOGIES:  No results found. However, due to the size of the patient record, not all encounters were searched. Please check Results Review for a complete set of results.  Lab Results   Component Value Date    HBSAG Nonreactive 12/08/2013     Lab Results   Component Value Date    HEPBABQT 153.46 12/08/2013     No results found for: "ANTIHBCT"  No results found. However, due to the size of the patient record, not  all encounters were searched. Please check Results Review for a complete set of results.  No results found. However, due to the size of the patient record, not all encounters were searched. Please check Results Review for a complete set of results.  No results found. However, due to the size of  the patient record, not all encounters were searched. Please check Results Review for a complete set of results.    Lab Results   Component Value Date    HEPCABSCR Nonreactive 06/13/2022     No results found. However, due to the size of the patient record, not all encounters were searched. Please check Results Review for a complete set of results.  No results found. However, due to the size of the patient record, not all encounters were searched. Please check Results Review for a complete set of results.  No results found for: "HIV1AND2SCR"    AUTOIMMUNE:  No results found for: "ANASCREEN"  No results found for: "IGG"  No results found. However, due to the size of the patient record, not all encounters were searched. Please check Results Review for a complete set of results.  No results found. However, due to the size of the patient record, not all encounters were searched. Please check Results Review for a complete set of results.    OTHER:  Lab Results   Component Value Date    FRTN 17 10/30/2022     No results found. However, due to the size of the patient record, not all encounters were searched. Please check Results Review for a complete set of results.  No results found. However, due to the size of the patient record, not all encounters were searched. Please check Results Review for a complete set of results.    STUDIES:  Colonoscopy 2015  DIAGNOSIS                                A.   CECUM, POLYP (SNARE POLYPECTOMY):           -    Fragments of tubular adenoma (see Comment)           -    No high-grade dysplasia or malignancy            B.   RECTUM, ULCER (BIOPSY):           -    Colonic mucosa with focal erosion and  reactive epithelial changes           -    No dysplasia or malignancy           -    Deeper levels examined     EGD 02/2016     A.   DUODENUM, POLYP (BIOPSY):           -    Well differentiated neuroendocrine tumor, Grade 1, transected, see                comment           -    Maximum size: 7.5 mm (measured on slide)           -    Ki67 <2%           -    Synaptophysin positive            Comment: This case has been reviewed in intradepartmental consultation.        Assessment and Plan:  1. NASH: based on Fibroscan, she has F3 disease.  Appears stable, diet and exercise advised.  The finding of GAVE on her EGDs is concerning for cirrhosis.  Labs and [place] have been ordered.  2. Gastroparesis confirmed by gastric emptying study 06/2016: she had symptomatic relief using Reglan, but we  cannot continue this long term because of the risk for tardive dyskinesia.  Overall she is managing fine with zofran as needed for nausea.   3. Carcinoid: no evidence of residual disease on serial EGD.  4. Iron deficiency anemia: EGD, colonoscopy, and capsule have been previously.  Likely due to GAVE.  Will treat as needed during EGD and give iron infusions for low ferritin.    Orders Placed This Encounter    US ABDOMEN, COMPLETE    CBC No Differential    Comprehensive Metabolic Panel    INR     Follow up 6 months.    Bartholomew Crews MD, MPH  Division of Gastroenterology and Hepatology    I spent a total of 42 minutes on the day of the visit.

## 2022-12-17 NOTE — Nursing Note (Signed)
Patient is in room 4    Vital signs taken, allergies verified, screened for pain, tobacco hx verified.    Lubertha Basque, MA II

## 2022-12-17 NOTE — Patient Instructions (Addendum)
[phone] Arlyn Leak after Apr 06, 2023)    ABDOMEN ULTRASOUND PREPARATION-   Your physician has ordered a radiology exam for you. It is very important  that you properly prepare for this procedure so the best results are  obtained from the exam.  If you have any questions about your upcoming procedure or how to  prepare for it, please call the [facility] Radiology Department at   [phone] Option # 1.    Adult Exam Preparation: Ages 37 and older  No food, drink or medications 6 hours before appointment time.

## 2022-12-19 ENCOUNTER — Ambulatory Visit
Admission: RE | Admit: 2022-12-19 | Discharge: 2022-12-19 | Disposition: A | Discharge status: Home / Self Care | Payer: Medicare Other | Source: Ambulatory Visit | Attending: GASTROENTEROLOGY | Admitting: GASTROENTEROLOGY

## 2022-12-19 ENCOUNTER — Encounter: Payer: Self-pay | Admitting: GASTROENTEROLOGY

## 2022-12-19 ENCOUNTER — Other Ambulatory Visit: Payer: Self-pay | Admitting: GASTROENTEROLOGY

## 2022-12-19 ENCOUNTER — Ambulatory Visit (INDEPENDENT_AMBULATORY_CARE_PROVIDER_SITE_OTHER): Payer: Medicare Other

## 2022-12-19 DIAGNOSIS — D508 Other iron deficiency anemias: Secondary | ICD-10-CM

## 2022-12-19 DIAGNOSIS — D500 Iron deficiency anemia secondary to blood loss (chronic): Secondary | ICD-10-CM | POA: Insufficient documentation

## 2022-12-19 DIAGNOSIS — K7581 Nonalcoholic steatohepatitis (NASH): Secondary | ICD-10-CM

## 2022-12-19 LAB — COMPREHENSIVE METABOLIC PANEL
Alanine Transferase (ALT): 18 U/L (ref <=33)
Albumin: 4 g/dL (ref 4.0–4.9)
Alkaline Phosphatase (ALP): 119 U/L (ref 35–129)
Anion Gap: 15 mmol/L (ref 7–15)
Aspartate Transaminase (AST): 21 U/L (ref <=41)
Bilirubin Total: 1 mg/dL (ref <=1.2)
Calcium: 9.5 mg/dL (ref 8.6–10.0)
Carbon Dioxide Total: 22 mmol/L (ref 22–29)
Chloride: 106 mmol/L (ref 98–107)
Creatinine Serum: 1.19 mg/dL — ABNORMAL HIGH (ref 0.51–1.17)
E-GFR Creatinine (Female): 48 mL/min/{1.73_m2}
Glucose: 142 mg/dL — ABNORMAL HIGH (ref 74–109)
Potassium: 4.3 mmol/L (ref 3.4–5.1)
Protein: 7.7 g/dL (ref 6.6–8.7)
Sodium: 143 mmol/L (ref 136–145)
Urea Nitrogen, Blood (BUN): 26 mg/dL — ABNORMAL HIGH (ref 6–20)

## 2022-12-19 LAB — CBC NO DIFFERENTIAL
Hematocrit: 35.6 % — ABNORMAL LOW (ref 36.0–46.0)
Hemoglobin: 11.5 g/dL — ABNORMAL LOW (ref 12.0–16.0)
MCH: 26.7 pg — ABNORMAL LOW (ref 27.0–33.0)
MCHC: 32.4 % (ref 32.0–36.0)
MCV: 82.5 fL (ref 80.0–100.0)
MPV: 8.5 fL (ref 6.8–10.0)
Platelet Count: 151 10*3/uL (ref 130–400)
RDW: 17.6 % — ABNORMAL HIGH (ref 0.0–14.7)
Red Blood Cell Count: 4.31 10*6/uL (ref 4.00–5.20)
White Blood Cell Count: 5 10*3/uL (ref 4.5–11.0)

## 2022-12-19 LAB — INR
INR: 1.11 (ref 0.87–1.18)
Prothrombin Time: 10.1 secs (ref 8.4–10.7)

## 2022-12-19 LAB — FERRITIN: Ferritin: 48 ng/mL (ref 13–150)

## 2022-12-22 ENCOUNTER — Encounter: Payer: Self-pay | Admitting: Obstetrics & Gynecology

## 2022-12-24 NOTE — Patient Instructions (Signed)
It was a pleasure seeing you today.    Stroke treatment:  - Continue Aspirin 81 mg daily/Plavix 75 mg daily until September 3rd, then STOP aspirin but continue plavix  - Continue Atorvastatin 20 mg daily, this dose may need to be modified depending on your lab results   -  Follow up with Primary Care Physician (PCP) for vascular risk factor management (LDL < 70, HgbA1c < 6.5, normal blood pressure goal < 130/80)    Stroke work-up:  - repeat blood cholesterol levels (Please complete at any [facility] lab, ok to wait until next PCP appointment and have all labs drawn together)  - repeat cat scan of your brain [facility]) (Please call [facility]: [phone] to schedule your imaging study.  - 14 day Cardiac Monitor ([employer]) (Requires appointment to have placed.  Call the [facility] at [phone] to schedule.)    Follow-up 3 months.    Jacqulyn Cane, DO MS      Clinic contact information:  Please call [phone] if you have any non-urgent questions or if you would like to schedule an appointment. For emergencies you should call 911.     Education: we want all of our patients to know the warning signs of stroke. Look for weakness on one side, speech that doesn't make sense, slurred speech, feeling that the room is spinning, or unconsciousness. Call 911 immediately! Do not go to sleep or wait for the symptoms to get better; we can treat stroke if you arrive soon enough. For more information go to:   [email or web address].[email or web address]     These recommendations, as well as a summary of today's visit, will be conveyed to your Primary Care Physician.  We recommend a follow-up appointment with your Primary Care Provider to discuss these recommendations.

## 2022-12-24 NOTE — Progress Notes (Unsigned)
[facility] NEW PATIENT VISIT          RE: Paula Daugherty  MRN: [id number]  DOB: December 02, 1945  Date: 12/24/2022    Garth Bigness, MD  [address]  [place] [address]      Dear Dr Delford Field, Richelle Ito,     I had the pleasure of seeing Paula Daugherty as a new consultation in the Neurovascular clinic 12/24/2022  for stroke. I am sure you are aware of the history, but allow me to summarize it here for our records.    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 In June 2024 she presented to [facility] for speech changes and right sided numbness. She presented 2 days after symptom onset, and was seen by neurologist Dr. Richarda Osmond during the hospitalization. Her symptoms improved with her numbness resolving, but she still feels she has some word finding difficulties at times, is more easily frustrated and angry than before and is more easily startled. Outside records note NIHSS of 0 at time of hospital evaluation.    At time of presentation she denies having been on any antithrombotic medications. Per outside records her BP at that time was 186/75 and was treated with PO clonidine for this.  She was prescribed ASA and plavix and has been compliant with these medications since hospitalization. She has followed up with her PCP and has recently required an increase in her blood pressure medication.     Since hospitalization she denies any new neurological symptoms.     Review of Symptoms:  A complete review of systems was performed and was unremarkable except for those listed in the HPI.    No shortness of breath, no chest pain, no palpitations, no fever or chills, no weight loss, no rash, no joint swelling, no headache, no change in mood, no change in sleep, no change in memory, no vision change.  +balance  issues (noted when getting out of the car or getting up to walk)  +Daily rectal bleeding, has not changed since stroke or starting ASA/plavix medications, believes is from hemorroids    Past Medical History  Past Medical History[1]    Prior Surgeries  Past Surgical History[2]    Medications   verbally reviewed the medications with patient and can verify only the medication I personally prescribed, which included none of these):  Medications Ordered Prior to Encounter[3]      Allergies/Intolerances  is allergic to sulfa (sulfonamide antibiotics), contrast dye [radiopaque agent], hydrochlorothiazide, januvia [sitagliptin], keflex [cephalexin], lyrica [pregabalin], omnipaque [iohexol], prozac [fluoxetine hcl], and topiramate.    Although notes distant CT contrast allergy, has tolerated CT contrast recently without premedication    The patient is not able to have an MRI due to metal pieces in her ears bilaterally for otosclerosis     Family History  No family history of early onset stroke, myocardial infarction or peripheral vascular disease.  No family history of coagulation disorder.    family history includes Diabetes in her father; Heart in her mother; Non-contributory in her brother and brother.    Heart disease on both sides, not certain if family has had stroke history, possible mini-strokes in mother, possible dementia    Social History  Tobacco: not current, former  EtOH: typoically none, for special occasions  only  Recreational drugs: denies  Occupation: previously worked as Artist in hospital  Lives in [place]     reports that she has quit smoking. Her smoking use included cigarettes. She has a 2 pack-year smoking history. She has never been exposed to tobacco smoke. She has never used smokeless tobacco. She reports current alcohol use. She reports that she does not use drugs.    Examination  Last menstrual period 05/06/1983.  There is no height or weight on file to calculate BMI.    PHYSICAL  EXAMINATION:   General Appearance: Well developed, well-appearing, in no acute distress.   Skin: Anicteric, no rashes, no bruises.   HEENT: Head normocephalic and atraumatic. Oropharynx normal with moist mucous membranes. Neck was supple.   Cardiac: Warm and well perfused, no cyanosis.  Pulmonary: Breathing is non-labored. Breathing comfortably on room air.  GI: Nondistended.  Extremities: Warm and well perfused without edema. No cyanosis; no limitation of ROM at joints.    NEUROLOGICAL EXAMINATION:   Mental status: Awake, alert; oriented to person, place and date; speech is fluent with intact naming and repetition; normal comprehension and able to follow multiple step commands across the midline; memory intact to the details of the history. There is an adequate fund of recent and remote information. No apraxia. No left-right disorientation.    Cranial nerves: PERRL, VFFTC, EOMI without nystagmus, facial strength intact, facial sensation intact, hearing intact to conversation (L>R hearing ;loss at baseline from otosclerosis, cannot hear finger rub on left), tongue/palate midline, no dysarthria.    Motor: Normal muscle bulk and tone of arms and legs. No fasciculations or involuntary movements. There is no tremor at rest in arms or legs. No pronator drift. Fast and symmetric finger/foot taps   Delt  Biceps  Triceps  WE  FE  IO   Right  5  5  5  5  5  5    Left  5  5  5  5  5  5       IP  Quads  Hams TA    Right  5  5  5 5     Left  5  5  5 5       Coordination: No Finger-nose-finger or heel to shin dysmetria.    Sensation: intact to light touch.    Gait: Normal, narrow-based gait with normal stride length.      Investigations  Labs  Lab Results   Component Value Date    WBC 5.0 12/19/2022    HGB 11.5 (L) 12/19/2022    HCT 35.6 (L) 12/19/2022    MCV 82.5 12/19/2022    PLT 151 12/19/2022     Lab Results   Component Value Date    NA 143 12/19/2022    K 4.3 12/19/2022    CL 106 12/19/2022    CO2 22 12/19/2022    BUN 26 (H)  12/19/2022    CR 1.19 (H) 12/19/2022    GLU 142 (H) 12/19/2022     Lab Results   Component Value Date    TBIL 1.0 12/19/2022    AST 21 12/19/2022    ALT 18 12/19/2022    ALP 119 12/19/2022    ALB 4.0 12/19/2022     Lab Results   Component Value Date    CHOL 143 02/10/2022    HDL 63 02/10/2022    LDLC 44 02/10/2022    CHOLHDL 2.3 02/10/2022    TRIG 180 (H) 02/10/2022  NONHDLCHOL 80 02/10/2022     Lab Results   Component Value Date    HGBA1C 6.4 (H) 09/09/2022    HGBA1C 5.8 (H) 06/09/2022    HGBA1C 6.0 (H) 02/10/2022     Lab Results   Component Value Date    INR 1.11 12/19/2022    INR 1.13 06/13/2022    INR 1.08 11/08/2021          Imaging:  I personally reviewed and interpreted the following studies and agree with the radiology interpretations included below:     [facility] CT Angio Head+Neck w Contrast 10/06/2022 3:23 PM   FINDINGS:  CTA HEAD:  Intracranial ICAs: Moderate calcification without significant stenosis.  ACAs and AComm: No aneurysm or significant stenosis.  MCAs: No aneurysm or occlusion. Mild focal stenosis in left proximal M2 (7-50).  PCAs and PComms: No aneurysm or significant stenosis.  Vertebral, basilar, and cerebellar arteries: No aneurysm or significant stenosis.  Other: No midline shift or hydrocephalus.  CTA NECK:  Right carotid: Calcified plaque at the carotid bifurcation without significant stenosis.  Left carotid: Partly calcified plaque at carotid bifurcation without significant stenosis.  Vertebral: No significant stenosis.  Arch: No aneurysm or dissection.  Other: No neck mass or lymphadenopathy. Clear lung apices.  DELAYED CT HEAD: No abnormal intracranial enhancement.  IMPRESSION:  CTA head: No large vessel occlusion. Mild focal stenosis in left M2, likely due to atherosclerosis.  CTA neck: No significant stenosis.    I did not personally review these studies as they were not available:     [facility] CT Head wo Con 10/06/2022 12:52 PM  FINDINGS:  No acute intracranial hemorrhage,  mass effect, shift. No extra-axial fluid collections identified. Ventricles and sulci are unremarkable for age. There is deep white matter periventricular areas Daugherty-density.  Patient appears to be status post bilateral cataract surgery. Paranasal sinuses and mastoid air cells are clear.  IMPRESSION:  No acute intracranial hemorrhage, mass effect, midline shift.  Deep white matter periventricular areas of Daugherty density likely represent chronic small vessel ischemic changes although acute superimposed ischemia is not excluded.    [facility] US Vasc Dplx Education officer, community Art Bilat Comp 10/06/2022 1:11 PM   ATHEROSCLEROTIC PLAQUING:  RIGHT BIFURCATION: Mild to moderate  LEFT BIFURCATION: Mild to moderate  DOPPLER FINDINGS:  Peak Systolic End Diastolic  RIGHT CCA 78 cm/s 14 cm/s  RIGHT prox ICA 47 cm/s 12 cm/s  RIGHT middle ICA 54 cm/s 12 cm/s  RIGHT distal ICA 59 cm/s 17 cm/s  RIGHT ECA 57 cm/s  RIGHT VERT 40 cm/s  LEFT CCA 58 cm/s 13 cm/s  LEFT prox ICA 40 cm/s 11 cm/s  LEFT middle ICA 53 cm/s 18 cm/s  LEFT distal ICA 55 cm/s 18 cm/s  LEFT ECA 59 cm/s  LEFT VERT 31 cm/s  RIGHT ICA/CCA RATIO: 0.8  LEFT ICA/CCA RATIO: 1.0  VERTEBRAL ARTERIES:  RIGHT: Antegrade  LEFT: Antegrade  IMPRESSION:  No evidence of hemodynamically significant stenosis by velocities.     Other Studies  [facility] TTE 11/28/22:  SUMMARY: 1. Left ventricular ejection fraction, by Simpson's Biplane, is normal at 64 %. 2. There is no evidence of pericardial effusion. 3. Left ventricular diastolic function is indeterminate. 4. Mild concentric left ventricular hypertrophy. 5. Normal right ventricular size, wall thickness, and systolic function. 6. The left atrium is normal in size and structure. 7. The right atrium is normal in size and structure. 8. The tricuspid regurgitation jet is inadequate to estimate RVSP.  [facility] ECG 10/06/22 15:34   Sinus bradycardia, rate 57, incomplete right bundle branch block     No long term cardiac monitoring  performed.    Assessment:   Paula Daugherty is a 60yr right handed female with a past medical history of HTN, DM who presented in June 2024 to [facility] for reported resolved right sided weakness and speech difficulties. She now admits residual symptoms of mild word finding difficulties. Since then symptoms have been stable. Vitals and exam today remarkable for hypertension with BP 149/80,and no clear focal deficits but endorses subjective word finding difficulties in spontaneous speech. Stroke relevant work-up notable for last LDL 44 in 2023 and A1c 6.4 in May 2024, [facility] without ICH or clear area of infarction, and CTA with left M2 mild stenosis. TTE with no clear atrial cardiopathy, reduced EF or thrombus evident. My leading clinical suspicion for this presentation is acute stroke, with most likely stroke etiology large vessel disease (ICAD) given M2 stenosis, cortical symptoms (speech) in potential perfusion territory of this vessel. Other considerations include ESUS or small vessel disease. Recommend aggressive vascular risk factor modification with gradual blood pressure reduction so as to avoid hypoperfusion of M2 vessel. I discussed risks and benefits of repeat CTH given persistent symptoms to assist in clarifying stroke burden and work-up of stroke etiology based on location of ischemia which patient is agreeable to. I recommend additional work-up include repeat CTH to assist in clarification of stroke burden, 14 day ziopatch to look for any atrial fibrillation, repeat lipid panel to ensure at goal.     mRS 1    Plan:  #Stroke by clinical criteria, suspected L MCA with etiology ICAD  -- repeat CTH (ordered)  -- repeat dLDL  -- 14 day ziopatch (ordered)  -- Continue current antithrombotic regimen of ASA 81mg  daily and Plavix 75mg  daily until 01/06/23 (total 90 days), then STOP ASA 81mg  daily and continue plavix 75mg  daily.  -- Long term blood pressure goal 110-130/60-80 with gradual reduction  -- LDL goal <  70  -- Euglycemic goal  -- Close follow-up with PMD to optimize the above vascular risk factors (Goal BP 110-130/60-80,  A1c < 7, LDL < 70)   - if LDL above goal recommend increasing statin dose with PMD  -- Encouraged physical activity (at least 40min/day)    -- Follow-up in stroke clinic in 3 months.    Education:   I explained to Paula Daugherty the diagnosis, the pathogenesis of the medical condition, the proposed plan and the treatment rationale. All available imaging studies were reviewed with the patient.  Any questions were addressed.       The risk of future stroke was discussed. The patient was instructed to call 911 should new stroke symptoms develop. The risk of medications was addressed including the risk of hemorrhage associated with antiplatelets and anticoagulants.    Sincerely,    Jacqulyn Cane, DO MS    A total of 62 minutes was spent in patient care,  > 50% of that time was spent counseling and/or coordinating care on diagnosis, treatment risk/benefit/alternatives, prognosis. The patient understands and agrees with the plan of care outlined.     Start Time: 7:00 AM  End Time:  8:02 AM    I spent a total of 35 minutes on DOS 12/25/22 performing non-face-to-face prolonged services consisting of lab, imaging, chart review, note writing.          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    Psychiatric illness    [2]   Past Surgical History:  Procedure Laterality Date    BIOFEEDBACK PERI/URO/RECTAL  10/21/2017    session #1: N Score = 5.4    BIOFEEDBACK PERI/URO/RECTAL  11/04/2017    session #2    BIOFEEDBACK PERI/URO/RECTAL  11/18/2017     Session #3    BIOFEEDBACK PERI/URO/RECTAL  12/02/2017    Session #4    BIOFEEDBACK PERI/URO/RECTAL  12/30/2017    Session #5    BIOFEEDBACK PERI/URO/RECTAL  03/23/2018    Session #6    CAPSULE ENDOSCOPY  01/27/2018    CAPSULE ENDOSCOPY  01/29/2018         CAPSULE ENDOSCOPY  12/23/2021    wnl    COLONOSCOPY  01/04/2014    polyp--TA and erosion; hemorrhoids; tics; repeat x 3 yrs    COLONOSCOPY  12/16/2017    TA; repeat in 3 yrs    COLONOSCOPY  04/05/2021    polyps--path pending; hemorrhoids    EGD  04/2020    gastritis    EGD  04/08/2019    biopsy path--pending    EGD  04/05/2021    biopsies at tattoo--path pending    [facility] BIOFEEDBACK PERI/URO/RECTAL  05/02/2018         HC COLONOSCOPY,REMV LESN,SNARE  12/16/2017    MAMMOPLASTY, REDUCTION  1985    MANOMETRY  09/02/2017    work on constipation; kegels and biofeedback; proceed w/colonoscopy     PHACOEMULSIFICATION, CATARACT Right 02/12/2015    with IOL    PR ESOPHAGOGASTRODUODENOSCOPY TRANSORAL DIAGNOSTIC  02/12/2016    EGD--polyp--path pending; antral gastritis; no varices     PR ESOPHAGOGASTRODUODENOSCOPY TRANSORAL DIAGNOSTIC  06/11/2016    EGD--biopsy at site of carcinoid tumor path pending     PR ESOPHAGOGASTRODUODENOSCOPY TRANSORAL DIAGNOSTIC      EGD--folds in duodenum--path pending     PR ESOPHAGOGASTRODUODENOSCOPY TRANSORAL DIAGNOSTIC  04/2017    EGD; repeat EGD in 1 year     PR ESOPHAGOGASTRODUODENOSCOPY TRANSORAL DIAGNOSTIC  04/20/2018    gastritis; nodule--path pending    PR KNEE SCOPE,DIAGNOSTIC  1980s    Knee arthroscopy    PR LIGATE FALLOPIAN TUBE      Tubal ligation    PR REPAIR TYMPANIC MEMBRANE      Tympanoplasty    PR TOTAL ABDOM HYSTERECTOMY  1980s    partial; no BSO    REPAIR, BLEPHAROPTOSIS Bilateral 08/06/2015    Blepharoplasty bilateral upper lids   [3]   Current Outpatient Medications on File Prior to Visit   Medication Sig Dispense Refill    Albuterol (PROAIR HFA, PROVENTIL HFA, VENTOLIN HFA) 90 mcg/actuation inhaler INHALE 1 TO 2 PUFFS BY MOUTH  EVERY 6 HOURS AS NEEDED FOR WHEEZING 8.5 g 0    Aspirin 81 mg EC Tablet Take 1 tablet by mouth every morning. 90 tablet 3    Atorvastatin (LIPITOR) 20 mg Tablet Take 1 tablet by mouth every day at bedtime. 90 tablet 3  Blood Glucose Meter (ONETOUCH ULTRA2 METER) Kit 1 kit one time. 1 kit 0    Blood Sugar Diagnostic (ONETOUCH ULTRA TEST) Strips Use to test blood glucose 2x/day 100 strip 11    CloNIDine (CATAPRES) 0.1 mg Tablet Take 2 tablets by mouth every day at bedtime. Indications: "change of life" signs 180 tablet 3    Clopidogrel (PLAVIX) 75 mg Tablet Take 1 tablet by mouth every morning. 90 tablet 3    Folic Acid 1 mg Tablet Take 1 tablet by mouth every day. 90 tablet 3    Hydrocortisone (ANUSOL-HC) 25 mg Suppository Insert 1 suppository into the rectum every 12 hours. 12 suppository 0    Losartan (COZAAR) 100 mg tablet Take 1 tablet by mouth every day. 90 tablet 3    Metformin (GLUCOPHAGE) 1,000 mg tablet Take 1 tablet by mouth once daily with a meal. 90 tablet 3    Metoclopramide (REGLAN) 5 mg Tablet Take 1 tablet by mouth once daily if needed (for nausea).      NYSTATIN 100,000 unit/gram Powder Apply a thin layer of powder to affected area 4 times a day as needed 60 g 1    Ondansetron (ZOFRAN-ODT) 8 mg disintegrating tablet Take 1 tablet by mouth every 8 hours if needed. 100 tablet 1    Oxybutynin (DITROPAN XL) 5 mg SR Tablet Take 1 tablet by mouth every day. 90 tablet 3     No current facility-administered medications on file prior to visit.

## 2022-12-25 ENCOUNTER — Ambulatory Visit (HOSPITAL_BASED_OUTPATIENT_CLINIC_OR_DEPARTMENT_OTHER): Payer: Medicare Other | Admitting: NEUROLOGY

## 2022-12-25 ENCOUNTER — Ambulatory Visit
Payer: Medicare Other | Attending: Student in an Organized Health Care Education/Training Program | Admitting: Student in an Organized Health Care Education/Training Program

## 2022-12-25 ENCOUNTER — Ambulatory Visit (INDEPENDENT_AMBULATORY_CARE_PROVIDER_SITE_OTHER): Payer: Medicare Other

## 2022-12-25 ENCOUNTER — Encounter: Payer: Self-pay | Admitting: NEUROLOGY

## 2022-12-25 ENCOUNTER — Encounter: Payer: Self-pay | Admitting: Student in an Organized Health Care Education/Training Program

## 2022-12-25 VITALS — BP 132/63 | HR 59 | Temp 97.1°F | Ht 62.0 in | Wt 156.3 lb

## 2022-12-25 VITALS — BP 130/65 | HR 54 | Temp 98.0°F | Ht 62.0 in | Wt 156.3 lb

## 2022-12-25 DIAGNOSIS — E559 Vitamin D deficiency, unspecified: Secondary | ICD-10-CM | POA: Insufficient documentation

## 2022-12-25 DIAGNOSIS — E785 Hyperlipidemia, unspecified: Secondary | ICD-10-CM | POA: Insufficient documentation

## 2022-12-25 DIAGNOSIS — I63512 Cerebral infarction due to unspecified occlusion or stenosis of left middle cerebral artery: Secondary | ICD-10-CM | POA: Insufficient documentation

## 2022-12-25 DIAGNOSIS — E538 Deficiency of other specified B group vitamins: Secondary | ICD-10-CM

## 2022-12-25 DIAGNOSIS — D500 Iron deficiency anemia secondary to blood loss (chronic): Secondary | ICD-10-CM | POA: Insufficient documentation

## 2022-12-25 DIAGNOSIS — K7581 Nonalcoholic steatohepatitis (NASH): Secondary | ICD-10-CM | POA: Insufficient documentation

## 2022-12-25 DIAGNOSIS — E1129 Type 2 diabetes mellitus with other diabetic kidney complication: Secondary | ICD-10-CM

## 2022-12-25 DIAGNOSIS — I10 Essential (primary) hypertension: Secondary | ICD-10-CM | POA: Insufficient documentation

## 2022-12-25 DIAGNOSIS — Z794 Long term (current) use of insulin: Secondary | ICD-10-CM | POA: Insufficient documentation

## 2022-12-25 LAB — LIPID PANEL WITH DLDL REFLEX
Cholesterol: 108 mg/dL (ref <200)
HDL Cholesterol: 53 mg/dL (ref >40)
LDL Cholesterol Calculation: 26 mg/dL (ref <100)
Non-HDL Cholesterol: 55 mg/dL (ref <=150)
Total Cholesterol: HDL Ratio: 2 (ref <4.0)
Triglyceride Level: 147 mg/dL (ref <150)

## 2022-12-25 LAB — CBC NO DIFFERENTIAL
Hematocrit: 34.8 % — ABNORMAL LOW (ref 36.0–46.0)
Hemoglobin: 11.4 g/dL — ABNORMAL LOW (ref 12.0–16.0)
MCH: 26.9 pg — ABNORMAL LOW (ref 27.0–33.0)
MCHC: 32.7 % (ref 32.0–36.0)
MCV: 82.2 fL (ref 80.0–100.0)
MPV: 8.3 fL (ref 6.8–10.0)
Platelet Count: 150 10*3/uL (ref 130–400)
RDW: 16.8 % — ABNORMAL HIGH (ref 0.0–14.7)
Red Blood Cell Count: 4.23 10*6/uL (ref 4.00–5.20)
White Blood Cell Count: 5.2 10*3/uL (ref 4.5–11.0)

## 2022-12-25 LAB — BASIC METABOLIC PANEL
Anion Gap: 13 mmol/L (ref 7–15)
Calcium: 9.3 mg/dL (ref 8.6–10.0)
Carbon Dioxide Total: 23 mmol/L (ref 22–29)
Chloride: 105 mmol/L (ref 98–107)
Creatinine Serum: 1.35 mg/dL — ABNORMAL HIGH (ref 0.51–1.17)
E-GFR Creatinine (Female): 41 mL/min/{1.73_m2}
Glucose: 108 mg/dL (ref 74–109)
Potassium: 4.5 mmol/L (ref 3.4–5.1)
Sodium: 141 mmol/L (ref 136–145)
Urea Nitrogen, Blood (BUN): 27 mg/dL — ABNORMAL HIGH (ref 6–20)

## 2022-12-25 LAB — FOLATE: Folate: >20 ng/mL (ref >=7.0)

## 2022-12-25 LAB — VITAMIN D, 25 HYDROXY: Vitamin D, 25 Hydroxy: 50.1 ng/mL — ABNORMAL HIGH (ref 10.0–50.0)

## 2022-12-25 LAB — FERRITIN: Ferritin: 33 ng/mL (ref 13–150)

## 2022-12-25 MED ORDER — AMLODIPINE 5 MG TABLET
5.0000 mg | ORAL_TABLET | Freq: Every day | ORAL | 3 refills | Status: DC
Start: 2022-12-25 — End: 2023-03-13

## 2022-12-25 NOTE — Patient Instructions (Signed)
Thank you for letting me participate in your care.  It was very nice to see you today!    Please go to lab in lobby.  You do not have to be fasting.   If you are checking into lab or x-ray please check into the FRONT DESK first.    If you are not getting your labs done today, you can come in any time during business hours and walk into lab. X-Ray on site also takes walk ins.    IMPORTANT THINGS TO KNOW:   If you had a referral placed today, please give it 10 to 14 business days before calling about the referral as that is the down time it takes for the insurance and referral department to go through the process.    Mychart messages, results, and prescription refills may take up to 72 hours for a response.   Thank you for your patience.    Please call if you have any questions or concerns. Our phone number is [phone]    Follow up as discussed.     FYI:  A new federal rule requires test results to be released to you immediately, even if your provider has not yet had a chance to review and interpret them for you. Your care team may need up to about 3 business days to review your results and explain them, if needed. We appreciate your understanding.     Dr. Wright  Family Medicine Physician   [facility]

## 2022-12-25 NOTE — Addendum Note (Signed)
Addended by: Joya San on: 12/25/2022 04:29 PM     Modules accepted: Orders

## 2022-12-25 NOTE — Nursing Note (Signed)
Vital signs taken, allergies verified, screened for pain, tobacco hx verified.  Paula Morales Juarez, MA I     I have asked the patient if they have received any medical treatment or consultation outside of the [facility].  The patient has indicated (yes or no) to receiving consults or services outside the [facility].  If yes, we have requested information to ascertain these results.     Patient WAS wearing a surgical mask  Contact precautions were followed when caring for the patient.

## 2022-12-25 NOTE — Progress Notes (Signed)
[facility]     Chief Complaint   Patient presents with    Stroke     Follow up stroke patient went to [facility]. Patient had stroke June first.           ASSESSMENT and PLAN:  1. Primary hypertension  Assessment: chronic, not at goal, goal < 130 systolic  Plan:   - Medication: losartan, add amlodipine    - Adjustments made today: Yes  - continue home monitoring   - consider bringing in home cuff to compare to clinic    - counseled on lifestyle measures including diet, exercise, ETOH reduction if applicable, NA intake   - Follow up 3-4 months   - Amlodipine (NORVASC) 5 mg Tablet; Take 1 tablet by mouth every day.  Dispense: 90 tablet; Refill: 3  - Basic Metabolic Panel; Future    2. Hyperlipidemia with target LDL less than 70  Assessment: established, chronic, control unknown. Goal LDL<70. Discussed lifestyle changes including diet and exercise including mediterranean diet.   Plan:   Continue lifestyle changes - mediterranean handout provided   Encouraged 150 mg of moderate aerobic exercise a week   Statin therapy: atorvastatin 20    Follow up 3-5 months   Consider repeat lipid panel at that time if lifestyle changes are made  If statin started, will repeat lipid panel in 4-6 weeks =    3. NASH (nonalcoholic steatohepatitis)  Established, ongoing. Recent US concerning for possible cirrhosis. Will get fibroscan for further assessment. Follow up as planned with Dr. Manus Gunning   - [place] FIBROSCAN; Future    4. Type 2 diabetes mellitus with other diabetic kidney complication, with long-term current use of insulin (HCC)  Established, stable. Goal <7% due for repeat A1c. Continue metformin.   - Hemoglobin A1C; Standing              SUBJECTIVE:  Paula Daugherty is a 77yr old female  here for chief complaint noted above.    HTN:   Took her log, brought in her report   Has been elevated   Ranging 108-186, diastolic 70-100.    Average is >140/90   This am, neuro wants it at 130 or less    Used to take amlodipine prior to hospitalization   Stay on plavix after September, but recommended she stop asa after Sept   Ordered more imaging     Reviewed her note from neuro:   Repeat CTH ordered   Repeating LDL   14 day ziopatch ordered   Plavix to be cont but asa can be stopped after 01/06/23   Goal BP 110-130/60-80, A1c < 7, LDL < 70    Fib-4 Score: 2.43    Still memory issues, struggling with words at times, some balance issues       Reviewed and updated as appropriateI reviewed and appropriately updated her lists of current problems, past medical/surgical history, and social history. :     ROS:  As noted in HPI. 10 point review of systems was otherwise negative.     Current Outpatient Medications   Medication Sig    Albuterol (PROAIR HFA, PROVENTIL HFA, VENTOLIN HFA) 90 mcg/actuation inhaler INHALE 1 TO 2 PUFFS BY MOUTH EVERY 6 HOURS AS NEEDED FOR WHEEZING    Amlodipine (NORVASC) 5 mg Tablet Take 1 tablet by mouth every day.    Aspirin 81 mg EC Tablet Take 1 tablet by mouth every morning.  Atorvastatin (LIPITOR) 20 mg Tablet Take 1 tablet by mouth every day at bedtime.    Blood Glucose Meter (ONETOUCH ULTRA2 METER) Kit 1 kit one time.    Blood Sugar Diagnostic (ONETOUCH ULTRA TEST) Strips Use to test blood glucose 2x/day    CloNIDine (CATAPRES) 0.1 mg Tablet Take 2 tablets by mouth every day at bedtime. Indications: "change of life" signs    Clopidogrel (PLAVIX) 75 mg Tablet Take 1 tablet by mouth every morning.    Folic Acid 1 mg Tablet Take 1 tablet by mouth every day.    Hydrocortisone (ANUSOL-HC) 25 mg Suppository Insert 1 suppository into the rectum every 12 hours.    Losartan (COZAAR) 100 mg tablet Take 1 tablet by mouth every day.    Metformin (GLUCOPHAGE) 1,000 mg tablet Take 1 tablet by mouth once daily with a meal.    Metoclopramide (REGLAN) 5 mg Tablet Take 1 tablet by mouth once daily if needed (for nausea).    NYSTATIN 100,000 unit/gram Powder Apply a thin layer of powder to affected area 4  times a day as needed    Ondansetron (ZOFRAN-ODT) 8 mg disintegrating tablet Take 1 tablet by mouth every 8 hours if needed.    Oxybutynin (DITROPAN XL) 5 mg SR Tablet Take 1 tablet by mouth every day.     No current facility-administered medications for this visit.        Allergies as of 12/25/2022 - Reviewed 12/25/2022   Allergen Reaction Noted    Sulfa (sulfonamide antibiotics) Rash 07/11/2013    Contrast dye [radiopaque agent] Hives 07/11/2013    Hydrochlorothiazide Other-Reaction in Comments 07/11/2013    Januvia [sitagliptin] Other-Reaction in Comments 07/11/2013    Keflex [cephalexin] Abdominal Pain 07/12/2020    Lyrica [pregabalin] Other-Reaction in Comments 07/11/2013    Omnipaque [iohexol] Other-Reaction in Comments 07/11/2013    Prozac [fluoxetine hcl] Other-Reaction in Comments 07/11/2013    Topiramate Other-Reaction in Comments 07/11/2013        OBJECTIVE:   BP 130/65 (SITE: left arm, Orthostatic Position: sitting, Cuff Size: regular)   Pulse 54   Temp 36.7 C (98 F) (Temporal)   Ht 1.575 m (5\' 2" )   Wt 70.9 kg (156 lb 4.9 oz)   LMP 05/06/1983   SpO2 97%   BMI 28.59 kg/m    Gen: appears well, NAD  HEENT: EOMI, sclera clear, anicteric   CV: normal rate, pulses palpable   Chest: trachea midline, non labored breathing, speaking in full sentences   Extrem: no cyanosis or edema   Neuro: no focal deficits   Skin: no rashes or lesions   Psych: normal mood and affect       Hemoglobin   Date Value Ref Range Status   12/19/2022 11.5 (L) 12.0 - 16.0 g/dL Final   09/81/1914 78.2 12.0 - 16.0 g/dL Final     Hgb N5A   Date Value Ref Range Status   09/09/2022 6.4 (H) 3.9 - 5.6 % Final     Comment:     5.7 - 6.4% indicates an increased risk for diabetes.  >6.4% is a criterion for the diagnosis of diabetes.    This boronate affinity Hb A1c method provides accurate analytical results in the presence of nearly all hemoglobin variants. Hb F higher than 15% of total Hb may yield falsely low results. Conditions that  shorten red cell survival, such as the presence of unstable hemoglobins like Hb SS, Hb CC and Hb SC, or other causes of hemolytic anemia may  yield falsely low results. Iron deficiency anemia may yield falsely high results.   06/09/2022 5.8 (H) 3.9 - 5.6 % Final     Comment:     5.7 - 6.4% indicates an increased risk for diabetes.  >6.4% is a criterion for the diagnosis of diabetes.    This boronate affinity Hb A1c method provides accurate analytical results in the presence of nearly all hemoglobin variants. Hb F higher than 15% of total Hb may yield falsely low results. Conditions that shorten red cell survival, such as the presence of unstable hemoglobins like Hb SS, Hb CC and Hb SC, or other causes of hemolytic anemia may yield falsely low results. Iron deficiency anemia may yield falsely high results.     Thyroid Stimulating Hormone   Date Value Ref Range Status   09/18/2022 1.29 0.27 - 4.20 uIU/mL Final   05/07/2021 1.02 0.27 - 4.20 uIU/mL Final     Cholesterol   Date Value Ref Range Status   02/10/2022 143 <200 mg/dL Final   16/02/9603 540 <200 mg/dL Final     LDL Cholesterol Calculation   Date Value Ref Range Status   02/10/2022 44 <100 mg/dL Final   98/03/9146 59 <829 mg/dL Final     Creatinine Serum   Date Value Ref Range Status   12/19/2022 1.19 (H) 0.51 - 1.17 mg/dL Final   56/21/3086 5.78 0.51 - 1.17 mg/dL Final            4/69/6295     9:19 AM   PHQ-2   Interest 3   Feeling 2   PHQ-2 Total Score 5     No question data found.     1. Little interest or pleasure in doing things: 3  2. Feeling down, depressed or hopeless: 2  3. Trouble falling or staying asleep, or sleeping too much: 1  4. Feeling tired or having little energy: 3  5. Poor appetite or overeating: 0  6. Feeling bad about yourself - or that you are a failure or have let yourself or your family down: 1  7. Trouble concentrating on things such as reading the paper or watching television: 0  8. Moving or speaking so slowly that other people could  have noticed?  Or the opposite - being so fidgety or restless that you have been moving around more than usual: 0  9. Thoughts that you would be better off dead or of hurting yourself in some way: 0  If >  1: Please notify a mental health provider or the patients primary care provider with any concerns about acute risk for suicide.      PHQ-9 Score: (!) 10     PHQ9 Affective-Cognitive Total: 6/15  PHQ9 Physical Total: 4/12        Total Score Depression Severity   1-4 Minimal Depression   5-9 Mild Depression   10-14 Moderate Depression   15-19 Moderately Severe Depression   20-27 Severe Depression       Depression Treatment Plan:  see assessment/plan section of note    Return in about 3 months (around 03/27/2023) for HTN, htn.    I did review patient's past medical and family/social history, no changes noted.  Barriers to Learning assessed: none. Patient verbalizes understanding of teaching and instructions.  Education Needs Identified?   no      Patient was counseled specifically on the following: diagnostic results, impressions, and/or recommended diagnostic studies reviewed, instructions for management and/or follow-up, risk factor reduction and patient  and family education    I spent 20 minutes with the patient, >50% of which were spent counseling her regarding differential, additional testing, if indicated, potential treatment options, and lifestyle modifications, if applicable    Today's visit has complexity inherent to evaluation and management associated with medical care services that serve as the continuing focal point for all needed health care services.    Parts of this note may have been created with the support of Dragon dictation.  Please accept any grammatical, sound-alike, or syntax errors as likely dictation errors.    Electronically signed by:  Wyatt Portela, MD  Associate Physician  [facility]

## 2022-12-25 NOTE — Nursing Note (Signed)
Identify by two identifiers vitals taken, allergies and pain level verified,tobacco usage, pharmacy updated.   Patient WAS not wearing a surgical mask      Shaneese Tait MA

## 2022-12-25 NOTE — Addendum Note (Signed)
Addended by: Thom Chimes on: 12/25/2022 02:39 PM     Modules accepted: Orders

## 2023-01-09 ENCOUNTER — Telehealth: Payer: Self-pay

## 2023-01-09 NOTE — Telephone Encounter (Signed)
[name] confirmed mailing address and contact number. Rhonna Drugan was registered to receive a Cardiac Patch monitor via [place] [employer].    Dr. Lucretia Roers ordered this test for stroke.

## 2023-01-17 ENCOUNTER — Ambulatory Visit
Admission: RE | Admit: 2023-01-17 | Discharge: 2023-01-17 | Disposition: A | Discharge status: Home / Self Care | Payer: Medicare Other | Source: Ambulatory Visit | Attending: Cardiovascular Disease | Admitting: Cardiovascular Disease

## 2023-01-17 DIAGNOSIS — I471 Supraventricular tachycardia, unspecified: Secondary | ICD-10-CM | POA: Insufficient documentation

## 2023-01-19 ENCOUNTER — Ambulatory Visit
Admission: RE | Admit: 2023-01-19 | Discharge: 2023-01-19 | Disposition: A | Discharge status: Home / Self Care | Payer: Medicare Other | Source: Ambulatory Visit | Attending: Diagnostic Radiology | Admitting: Diagnostic Radiology

## 2023-01-19 DIAGNOSIS — K7581 Nonalcoholic steatohepatitis (NASH): Secondary | ICD-10-CM | POA: Insufficient documentation

## 2023-01-21 ENCOUNTER — Encounter: Payer: Self-pay | Admitting: Student in an Organized Health Care Education/Training Program

## 2023-01-21 ENCOUNTER — Encounter: Payer: Self-pay | Admitting: "Endocrinology

## 2023-01-21 NOTE — Telephone Encounter (Signed)
Forwarding to Provider to review and respond to patient.  Requesting recent US results and states she got Flu shot.     Advised patient to have DJ send message on their account stating DJ received flu shot

## 2023-01-21 NOTE — Telephone Encounter (Signed)
Routed to provider for review.

## 2023-01-23 ENCOUNTER — Ambulatory Visit
Admission: RE | Admit: 2023-01-23 | Discharge: 2023-01-23 | Disposition: A | Discharge status: Home / Self Care | Payer: Medicare Other | Source: Ambulatory Visit

## 2023-01-23 ENCOUNTER — Ambulatory Visit (INDEPENDENT_AMBULATORY_CARE_PROVIDER_SITE_OTHER): Payer: Medicare Other | Admitting: NURSE PRACTITIONER

## 2023-01-23 VITALS — BP 154/71 | HR 62 | Temp 97.3°F | Resp 16 | Ht 62.0 in | Wt 162.3 lb

## 2023-01-23 DIAGNOSIS — M5432 Sciatica, left side: Secondary | ICD-10-CM

## 2023-01-23 MED ORDER — CYCLOBENZAPRINE 5 MG TABLET
5.0000 mg | ORAL_TABLET | Freq: Every day | ORAL | 1 refills | Status: DC | PRN
Start: 2023-01-23 — End: 2023-03-13

## 2023-01-23 MED ORDER — GABAPENTIN 100 MG CAPSULE
100.0000 mg | ORAL_CAPSULE | Freq: Every day | ORAL | 1 refills | Status: AC
Start: 2023-01-23 — End: 2024-01-18

## 2023-01-23 NOTE — Progress Notes (Signed)
ASSESSMENT & PLAN       1. Left sided sciatica  - Acute on chronic, previously affecting right side  - No previous imaging completed; ordered today  - Continue home rehab exercises; counseled patient and husband  - Continue home analgesia with Tylenol and naproxen/ibuprofen  - Add low-dose gabapentin QHS PRN + Flexeril QHS PRN    - Homero Fellers conversation held regarding risk of falls, sedation, dizziness, and drowsiness with these medications; patient aware    - Has allergy to Lyrica in chart, but patient uncertain on reaction; advised to use gabapentin cautiously and halt if side effects develop  - Follow-up 4-6 weeks, reassess pain + consider formal PT intervention  - L-SPINE 2 OR 3 VIEWS  - Gabapentin (NEURONTIN) 100 mg Capsule; Take 1 capsule by mouth every day at bedtime.  Dispense: 30 capsule; Refill: 1  - Cyclobenzaprine (FLEXERIL) 5 mg Tablet; Take 1 tablet by mouth every day at bedtime if needed.  Dispense: 30 tablet; Refill: 1      SUBJECTIVE      Chief Complaint   Patient presents with    Back Pain      Paula Daugherty 33yr female presenting for back pain.  Per chart review:  - Last DEXA (2017): osteopenia of left femoral neck; otherwise normal bone mineral density in left total hip and lumbar spine  - No prior L-spine x-rays noted   Today, patient reports:  - Reports left-sided sciatica for the last few months    - Has had sciatica on the right side in the past, last affected her in 2023  - Has attempted exercises she's received from [place] in the past, but not helping    - Has sciatica rehab exercises dated from 04/2022  - Attempted Tylenol, naproxen, and heating/icing as directed without improvement in her pain level  - Reports in April/May, she had a lower back massage while in [place], may have had something "tweaked" at that time  - Denies saddle anesthesia, changes in bowel continence, or changes in gait      All other systems are negative unless otherwise noted in the HPI.    OBJECTIVE      BP (!) 154/71  (SITE: left arm, Orthostatic Position: sitting, Cuff Size: regular)   Pulse 62   Temp 36.3 C (97.3 F) (Skin)   Resp 16   Ht 1.575 m (5\' 2" )   Wt 73.6 kg (162 lb 4.1 oz)   LMP 05/06/1983   SpO2 99%   BMI 29.68 kg/m      Physical Exam:  General Appearance: healthy, alert, no distress, pleasant affect, cooperative  Neck:  Neck supple  Heart:  normal rate and regular rhythm, no murmurs, clicks, or gallops  Lungs: clear to auscultation, no wheezes, rales, rhonchi  Lumbar spine: tenderness over left SI joint with radiation into lateral left thigh and extending into foot in L4/5 distribution; left hip flexion weaker than right; sensation intact throughout bilateral lower extremities; normal gait, non-antalgic, without assistive device; heat/massage machine present on back     Total time I spent in care of this patient today (excluding time spent on other billable services) was as per time documentation captured by EMR unless otherwise noted below.  20        Electronically signed by:  Linus Mako, FNP-BC  Nurse Practitioner II  [facility]      Supervising Physician: Dr. Shawnee Knapp

## 2023-01-23 NOTE — Patient Instructions (Signed)
Use careful body mechanics when changing position or lifting heavy items  "Bed rest" can make your pain worse. Be sure to move and advance your activity as tolerated  Strengthen your core and back with stretching, weight training, or balance training  For pain, can use either ibuprofen (600-800mg ) or acetaminophen, aka Tylenol (1000mg ), every 6-8 hours as needed  Be sure to take ibuprofen with FOOD in the stomach to avoid stomach upset or nausea  Do not take more than 3000mg  (3 g.) of acetaminophen in a day. Be sure to check all medications for "hidden" doses of acetaminophen.  Should you develop difficulty walking or new symptoms, contact your healthcare provider urgently to discuss further

## 2023-01-23 NOTE — Nursing Note (Signed)
Confirmed patient ID using 2 identifiers, Vital signs taken, allergies verified, out patient pharmacy entered, screened for pain, med hx taken.   Valora Norell, LVN

## 2023-01-24 DIAGNOSIS — M47816 Spondylosis without myelopathy or radiculopathy, lumbar region: Secondary | ICD-10-CM

## 2023-01-27 ENCOUNTER — Other Ambulatory Visit: Payer: Self-pay | Admitting: NURSE PRACTITIONER

## 2023-01-27 ENCOUNTER — Encounter: Payer: Self-pay | Admitting: Student in an Organized Health Care Education/Training Program

## 2023-01-27 DIAGNOSIS — M5432 Sciatica, left side: Secondary | ICD-10-CM

## 2023-01-27 MED ORDER — TRAMADOL 50 MG TABLET
50.0000 mg | ORAL_TABLET | Freq: Two times a day (BID) | ORAL | 0 refills | Status: AC | PRN
Start: 2023-01-27 — End: 2023-02-26

## 2023-01-27 NOTE — Telephone Encounter (Signed)
Forwarding to Provider to review and respond to patient.  Follow up to 01-23-23 visit and recent xray result note message. Patient states pain medication is not helping

## 2023-01-27 NOTE — Progress Notes (Signed)
Poor pain control on Flexeril/gabapentin QHS    Escalating care to tramadol; MyChart message sent to notify    If fails to improve with tramadol, recommended in-person evaluation to re-assess pain control and medication management

## 2023-02-03 ENCOUNTER — Encounter: Payer: Self-pay | Admitting: "Endocrinology

## 2023-02-03 NOTE — Telephone Encounter (Signed)
Routed to provider for review.

## 2023-02-05 ENCOUNTER — Ambulatory Visit: Payer: Medicare Other | Admitting: Student in an Organized Health Care Education/Training Program

## 2023-02-05 ENCOUNTER — Encounter: Payer: Self-pay | Admitting: Student in an Organized Health Care Education/Training Program

## 2023-02-05 VITALS — BP 126/62 | HR 65 | Ht 62.0 in | Wt 160.9 lb

## 2023-02-05 DIAGNOSIS — M5442 Lumbago with sciatica, left side: Secondary | ICD-10-CM

## 2023-02-05 DIAGNOSIS — G8929 Other chronic pain: Secondary | ICD-10-CM

## 2023-02-05 DIAGNOSIS — K7469 Other cirrhosis of liver: Secondary | ICD-10-CM

## 2023-02-05 DIAGNOSIS — K7581 Nonalcoholic steatohepatitis (NASH): Secondary | ICD-10-CM

## 2023-02-05 DIAGNOSIS — I10 Essential (primary) hypertension: Secondary | ICD-10-CM

## 2023-02-05 MED ORDER — GABAPENTIN 100 MG CAPSULE
ORAL_CAPSULE | ORAL | 3 refills | Status: DC
Start: 2023-02-05 — End: 2023-03-13

## 2023-02-05 NOTE — Nursing Note (Signed)
Vital signs taken, allergies verified, screened for pain, tobacco hx verified.  Paula Bugaj Morales Juarez, MA I     I have asked the patient if they have received any medical treatment or consultation outside of the [facility].  The patient has indicated (yes or no) to receiving consults or services outside the [facility].  If yes, we have requested information to ascertain these results.     Patient WAS wearing a surgical mask  Contact precautions were followed when caring for the patient.

## 2023-02-05 NOTE — Progress Notes (Signed)
[facility] [place]  Family Medicine     Chief Complaint   Patient presents with    Blood Pressure    Pain     Sciatica pain          ASSESSMENT and PLAN:  1. Primary hypertension  Assessment: chronic, stable  Plan:   - Medication: Losartan 100 mg, amlodipine 5 mg  - Adjustments made today: No  - continue home monitoring   - consider bringing in home cuff to compare to clinic    - counseled on lifestyle measures including diet, exercise, ETOH reduction if applicable, NA intake   - Follow up 3-4 months       2. NASH (nonalcoholic steatohepatitis)  3. Other cirrhosis of liver (HCC)  #Fatty liver disease  Patient has history of nash with FIB-4 score Fib-4 Score: 2.47.  Patient has medium (<1.3 is low risk, 1.3-2.67 is moderate risk for fibrosis, >2.67 is high risk for fibrosis) risk for liver fibrosis based on Fibrosis 4 score.   Due to this risk, patient needs moderate risk for fibrosis, plan to repeat CBC and LFT in 1-2 months. Recent US confirmed fibrosis. Follow up as planned w DR. Chak.     4. Chronic left-sided low back pain with left-sided sciatica  Assessment: Chronic, ongoing. ddx includes DDD, lumbar radiculopathy, piriformis syndrome, SI joint dysfunction, sciatica etc.  Discussed management options including pain control, physical therapy, home exercises, imaging.    Plan:   - consider physical therapy   - home exercises provided, continue  - xrays previously completed  - counseled on pain control with tylenol, ibuprofen, ice/heat therapy, topical Voltaren and lidocaine patches   -Patient with notable weakness on knee except extension and flexion.  Will proceed with CT L-spine.  -Titrate gabapentin dosing.  Instructions provided.  Follow-up 2 to 3 months.  - consider referral to integrative medicine for ongoing pain control   - Gabapentin (NEURONTIN) 100 mg Capsule; 200 mg nightly for 3-5 days then 300 mg nightly for 3-5 days, then add 100 mg qam for 3-5 days then increase to 300 mg BID  as tolerated.  Dispense: 270 capsule; Refill: 3  - CT L-SPINE WITHOUT CONTRAST; Future    SUBJECTIVE:  Paula Daugherty is a 77yr old female  here for chief complaint noted above.    BP:   At home: good   Here much better after amlodipine     Has CT scheduled for Saturday   Wondering about b12     Not sure why she is on lipitor   Dr. Manus Gunning is leaving - hoping to follow him to [facility]     Sciatica is "horrible"   Did flexeril and gabapentin - no effect   Tramadol - minimal to no effect   Pain is sometimes excruciating   L side   Doing heat/ice   Home exercises - not helping   Xrays done - Lumbar spine     Reviewed and updated as appropriateI reviewed and appropriately updated her lists of current problems, past medical/surgical history, and social history. :     ROS:  As noted in HPI. 10 point review of systems was otherwise negative.     Current Outpatient Medications   Medication Sig    Albuterol (PROAIR HFA, PROVENTIL HFA, VENTOLIN HFA) 90 mcg/actuation inhaler INHALE 1 TO 2 PUFFS BY MOUTH EVERY 6 HOURS AS NEEDED FOR WHEEZING    Amlodipine (NORVASC) 5 mg Tablet Take 1 tablet by mouth every  day.    Aspirin 81 mg EC Tablet Take 1 tablet by mouth every morning.    Atorvastatin (LIPITOR) 20 mg Tablet Take 1 tablet by mouth every day at bedtime.    Blood Glucose Meter (ONETOUCH ULTRA2 METER) Kit 1 kit one time.    Blood Sugar Diagnostic (ONETOUCH ULTRA TEST) Strips Use to test blood glucose 2x/day    CloNIDine (CATAPRES) 0.1 mg Tablet Take 2 tablets by mouth every day at bedtime. Indications: "change of life" signs    Clopidogrel (PLAVIX) 75 mg Tablet Take 1 tablet by mouth every morning.    Cyclobenzaprine (FLEXERIL) 5 mg Tablet Take 1 tablet by mouth every day at bedtime if needed.    Folic Acid 1 mg Tablet Take 1 tablet by mouth every day.    Gabapentin (NEURONTIN) 100 mg Capsule 200 mg nightly for 3-5 days then 300 mg nightly for 3-5 days, then add 100 mg qam for 3-5 days then increase to 300 mg BID as tolerated.     Hydrocortisone (ANUSOL-HC) 25 mg Suppository Insert 1 suppository into the rectum every 12 hours.    Losartan (COZAAR) 100 mg tablet Take 1 tablet by mouth every day.    Metformin (GLUCOPHAGE) 1,000 mg tablet Take 1 tablet by mouth once daily with a meal.    Metoclopramide (REGLAN) 5 mg Tablet Take 1 tablet by mouth once daily if needed (for nausea).    NYSTATIN 100,000 unit/gram Powder Apply a thin layer of powder to affected area 4 times a day as needed    Ondansetron (ZOFRAN-ODT) 8 mg disintegrating tablet Take 1 tablet by mouth every 8 hours if needed.    Oxybutynin (DITROPAN XL) 5 mg SR Tablet Take 1 tablet by mouth every day.    Tramadol (ULTRAM) 50 mg Tablet Take 1 tablet by mouth two times daily if needed for pain. Use sparingly if needed     No current facility-administered medications for this visit.        Allergies as of 02/05/2023 - Reviewed 02/05/2023   Allergen Reaction Noted    Sulfa (sulfonamide antibiotics) Rash 07/11/2013    Contrast dye [radiopaque agent] Hives 07/11/2013    Hydrochlorothiazide Other-Reaction in Comments 07/11/2013    Januvia [sitagliptin] Other-Reaction in Comments 07/11/2013    Keflex [cephalexin] Abdominal Pain 07/12/2020    Lyrica [pregabalin] Other-Reaction in Comments 07/11/2013    Omnipaque [iohexol] Other-Reaction in Comments 07/11/2013    Prozac [fluoxetine hcl] Other-Reaction in Comments 07/11/2013    Topiramate Other-Reaction in Comments 07/11/2013        OBJECTIVE:   BP 126/62 (SITE: left arm, Orthostatic Position: sitting, Cuff Size: regular)   Pulse 65   Ht 1.575 m (5\' 2" )   Wt 73 kg (160 lb 15 oz)   LMP 05/06/1983   SpO2 99%   BMI 29.44 kg/m    Gen: appears well, NAD  HEENT: EOMI, sclera clear, anicteric   CV: normal rate, pulses palpable   Chest: trachea midline, non labored breathing, speaking in full sentences   Extrem: no cyanosis or edema   Neuro: no focal deficits   Skin: no rashes or lesions   Psych: normal mood and affect   Back: Strength 5/5 RLE  b/l, strength 4 out of 5 in the left lower extremity with knee flexion and extension.  Senstion to light touch intact, reflexes 2+ patellar, + tender to palpation along Lumbar paraspinal muscles.        Hemoglobin   Date Value Ref Range Status  12/25/2022 11.4 (L) 12.0 - 16.0 g/dL Final   16/02/9603 54.0 (L) 12.0 - 16.0 g/dL Final     Hgb J8J   Date Value Ref Range Status   09/09/2022 6.4 (H) 3.9 - 5.6 % Final     Comment:     5.7 - 6.4% indicates an increased risk for diabetes.  >6.4% is a criterion for the diagnosis of diabetes.    This boronate affinity Hb A1c method provides accurate analytical results in the presence of nearly all hemoglobin variants. Hb F higher than 15% of total Hb may yield falsely low results. Conditions that shorten red cell survival, such as the presence of unstable hemoglobins like Hb SS, Hb CC and Hb SC, or other causes of hemolytic anemia may yield falsely low results. Iron deficiency anemia may yield falsely high results.   06/09/2022 5.8 (H) 3.9 - 5.6 % Final     Comment:     5.7 - 6.4% indicates an increased risk for diabetes.  >6.4% is a criterion for the diagnosis of diabetes.    This boronate affinity Hb A1c method provides accurate analytical results in the presence of nearly all hemoglobin variants. Hb F higher than 15% of total Hb may yield falsely low results. Conditions that shorten red cell survival, such as the presence of unstable hemoglobins like Hb SS, Hb CC and Hb SC, or other causes of hemolytic anemia may yield falsely low results. Iron deficiency anemia may yield falsely high results.     Thyroid Stimulating Hormone   Date Value Ref Range Status   09/18/2022 1.29 0.27 - 4.20 uIU/mL Final   05/07/2021 1.02 0.27 - 4.20 uIU/mL Final     Cholesterol   Date Value Ref Range Status   12/25/2022 108 <200 mg/dL Final   19/14/7829 562 <200 mg/dL Final     LDL Cholesterol Calculation   Date Value Ref Range Status   12/25/2022 26 <100 mg/dL Final   13/12/6576 44 <469 mg/dL  Final     Creatinine Serum   Date Value Ref Range Status   12/25/2022 1.35 (H) 0.51 - 1.17 mg/dL Final   62/95/2841 3.24 (H) 0.51 - 1.17 mg/dL Final            08/04/270     9:19 AM   PHQ-2   Interest 3   Feeling 2   PHQ-2 Total Score 5     No question data found.           If >  1: Please notify a mental health provider or the patients primary care provider with any concerns about acute risk for suicide.             /15   /12        Total Score Depression Severity   1-4 Minimal Depression   5-9 Mild Depression   10-14 Moderate Depression   15-19 Moderately Severe Depression   20-27 Severe Depression       Depression Treatment Plan:  see assessment/plan section of note    Return in about 3 months (around 05/08/2023), or if symptoms worsen or fail to improve.    I did review patient's past medical and family/social history, no changes noted.  Barriers to Learning assessed: none. Patient verbalizes understanding of teaching and instructions.  Education Needs Identified?   no      Patient was counseled specifically on the following: diagnostic results, impressions, and/or recommended diagnostic studies reviewed, instructions for management and/or follow-up, risk factor  reduction and patient and family education    I spent 40 minutes with the patient, >50% of which were spent counseling her regarding differential, additional testing, if indicated, potential treatment options, and lifestyle modifications, if applicable    Today's visit has complexity inherent to evaluation and management associated with medical care services that serve as the continuing focal point for all needed health care services.    Parts of this note may have been created with the support of Dragon dictation.  Please accept any grammatical, sound-alike, or syntax errors as likely dictation errors.    Electronically signed by:  Wyatt Portela, MD  Associate Physician  [facility] - [facility]

## 2023-02-05 NOTE — Patient Instructions (Addendum)
Gabapentin:   Increase from 100 mg nightly to 200 mg nightly for 3-5 days then 300 mg nightly   If pain still not controlled, add morning dose of 100 mg in the morning.  Then increase similarly, 200 mg after 3-5 days, then 300 mg.

## 2023-02-06 DIAGNOSIS — I471 Supraventricular tachycardia, unspecified: Secondary | ICD-10-CM

## 2023-02-07 ENCOUNTER — Ambulatory Visit
Admission: RE | Admit: 2023-02-07 | Discharge: 2023-02-07 | Disposition: A | Discharge status: Home / Self Care | Payer: Medicare Other | Source: Ambulatory Visit | Attending: Diagnostic Radiology | Admitting: Diagnostic Radiology

## 2023-02-07 DIAGNOSIS — G8929 Other chronic pain: Secondary | ICD-10-CM | POA: Insufficient documentation

## 2023-02-07 DIAGNOSIS — M5442 Lumbago with sciatica, left side: Secondary | ICD-10-CM | POA: Insufficient documentation

## 2023-02-07 DIAGNOSIS — I63512 Cerebral infarction due to unspecified occlusion or stenosis of left middle cerebral artery: Secondary | ICD-10-CM | POA: Insufficient documentation

## 2023-02-09 DIAGNOSIS — I63512 Cerebral infarction due to unspecified occlusion or stenosis of left middle cerebral artery: Secondary | ICD-10-CM

## 2023-02-09 DIAGNOSIS — M5442 Lumbago with sciatica, left side: Secondary | ICD-10-CM

## 2023-02-09 DIAGNOSIS — G8929 Other chronic pain: Secondary | ICD-10-CM

## 2023-02-09 NOTE — Progress Notes (Signed)
ZIOPATCH report was processed and sent to [facility] on 02/09/2023.  Report signed by Dr. Amparo Bristol.

## 2023-03-11 ENCOUNTER — Ambulatory Visit: Payer: Medicare Other | Attending: Family Medicine

## 2023-03-11 DIAGNOSIS — Z794 Long term (current) use of insulin: Secondary | ICD-10-CM | POA: Insufficient documentation

## 2023-03-11 DIAGNOSIS — E1129 Type 2 diabetes mellitus with other diabetic kidney complication: Secondary | ICD-10-CM | POA: Insufficient documentation

## 2023-03-11 DIAGNOSIS — D508 Other iron deficiency anemias: Secondary | ICD-10-CM

## 2023-03-11 DIAGNOSIS — I10 Essential (primary) hypertension: Secondary | ICD-10-CM | POA: Insufficient documentation

## 2023-03-11 DIAGNOSIS — D500 Iron deficiency anemia secondary to blood loss (chronic): Secondary | ICD-10-CM | POA: Insufficient documentation

## 2023-03-11 LAB — BASIC METABOLIC PANEL
Anion Gap: 12 mmol/L (ref 7–15)
Calcium: 9.3 mg/dL (ref 8.6–10.0)
Carbon Dioxide Total: 24 mmol/L (ref 22–29)
Chloride: 105 mmol/L (ref 98–107)
Creatinine Serum: 1.22 mg/dL — ABNORMAL HIGH (ref 0.51–1.17)
E-GFR Creatinine (Female): 46 mL/min/{1.73_m2}
Glucose: 132 mg/dL — ABNORMAL HIGH (ref 74–109)
Potassium: 4.3 mmol/L (ref 3.4–5.1)
Sodium: 141 mmol/L (ref 136–145)
Urea Nitrogen, Blood (BUN): 22 mg/dL — ABNORMAL HIGH (ref 6–20)

## 2023-03-11 LAB — CBC NO DIFFERENTIAL
Hematocrit: 36.4 % (ref 36.0–46.0)
Hemoglobin: 12.1 g/dL (ref 12.0–16.0)
MCH: 28.3 pg (ref 27.0–33.0)
MCHC: 33.2 % (ref 32.0–36.0)
MCV: 85.3 fL (ref 80.0–100.0)
MPV: 7.9 fL (ref 6.8–10.0)
Platelet Count: 198 10*3/uL (ref 130–400)
RDW: 15 % — ABNORMAL HIGH (ref 0.0–14.7)
Red Blood Cell Count: 4.26 10*6/uL (ref 4.00–5.20)
White Blood Cell Count: 6.6 10*3/uL (ref 4.5–11.0)

## 2023-03-11 LAB — FERRITIN: Ferritin: 24 ng/mL (ref 13–150)

## 2023-03-12 LAB — HEMOGLOBIN A1C
Hgb A1C,Glucose Est Avg: 148 mg/dL
Hgb A1C: 6.8 % — ABNORMAL HIGH (ref 3.9–5.6)

## 2023-03-13 ENCOUNTER — Encounter: Payer: Self-pay | Admitting: NURSE PRACTITIONER

## 2023-03-13 ENCOUNTER — Ambulatory Visit: Payer: Medicare Other | Admitting: NURSE PRACTITIONER

## 2023-03-13 VITALS — BP 135/54 | HR 68 | Temp 97.3°F | Ht 62.0 in | Wt 160.9 lb

## 2023-03-13 DIAGNOSIS — M5116 Intervertebral disc disorders with radiculopathy, lumbar region: Secondary | ICD-10-CM

## 2023-03-13 DIAGNOSIS — I10 Essential (primary) hypertension: Secondary | ICD-10-CM

## 2023-03-13 DIAGNOSIS — R21 Rash and other nonspecific skin eruption: Secondary | ICD-10-CM

## 2023-03-13 MED ORDER — AMLODIPINE 10 MG TABLET: 10.0000 mg | ORAL_TABLET | Freq: Every day | ORAL | 3 refills | Status: DC

## 2023-03-13 MED ORDER — TRIAMCINOLONE ACETONIDE 0.1 % TOPICAL CREAM
TOPICAL_CREAM | Freq: Two times a day (BID) | TOPICAL | 1 refills | Status: AC
Start: 2023-03-13 — End: 2024-03-07

## 2023-03-13 MED ORDER — GABAPENTIN 300 MG CAPSULE
ORAL_CAPSULE | ORAL | 1 refills | Status: DC
Start: 2023-03-13 — End: 2023-03-25

## 2023-03-13 NOTE — Progress Notes (Signed)
ASSESSMENT & PLAN       1. Lumbar disc disease with radiculopathy  - Chronic condition, not well controlled at this time  - Reviewed CT L-spine with patient today  - Spine Program referral requested for consult, possible recommendations on injections vs surgery given CT findings  - PT referral to reduce pain and improve function; counseled on impact of PT on back pain, possible lack of response given discogenic source of pain  - Gabapentin adjusted: 300mg  QAM, 600mg  QHS, possible 300mg  dose in mid-day if needed  - Continue Tylenol 1000mg  TID PRN; cautioned on regular use ibuprofen    - Advised if breakthrough pain control needed, 400-600mg  once per day only   - Follow-up after evaluation with specialists as indicated OR based on pain level/response to medications may require sooner evaluation  - Spine Program Referral  - Physical Therapy Referral  - Gabapentin (NEURONTIN) 300 mg Capsule; Take 1 capsule by mouth every morning AND 2 capsules every day at bedtime. If needed, OK to take 1 capsule by mouth at mid-day for pain control.  Dispense: 180 capsule; Refill: 1    2. Primary hypertension  - Chronic condition, well controlled  - Medication refilled only; no changes in management made  - Amlodipine (NORVASC) 10 mg Tablet; Take 1 tablet by mouth every day.  Dispense: 90 tablet; Refill: 3    3. Rash and nonspecific skin eruption  - Acute onset, uncertain etiology  - DDx: hives vs eczema vs fungal infection (given recent cruise in [place]) vs other  - Trial Daugherty-potency steroid cream over area; counseled on use, risks, side effects   - Return/ER precautions reviewed; patient and partner aware   - Follow-up as needed if not improving as expected   - Triamcinolone (KENALOG) 0.1 % Cream; Apply to the affected area 2 times daily.  Dispense: 30 g; Refill: 1     Modified Medications    Modified Medication Previous Medication    AMLODIPINE (NORVASC) 10 MG TABLET Amlodipine (NORVASC) 5 mg Tablet       Take 1 tablet by  mouth every day.    Take 1 tablet by mouth every day.    GABAPENTIN (NEURONTIN) 300 MG CAPSULE Gabapentin (NEURONTIN) 100 mg Capsule       Take 1 capsule by mouth every morning AND 2 capsules every day at bedtime. If needed, OK to take 1 capsule by mouth at mid-day for pain control.    200 mg nightly for 3-5 days then 300 mg nightly for 3-5 days, then add 100 mg qam for 3-5 days then increase to 300 mg BID as tolerated.      SUBJECTIVE      Chief Complaint   Patient presents with    Back Pain     Sciatic pain follow up       Paula Daugherty 31yr female presenting for follow-up on her left-sided sciatica pain  - Continues to have lower back pain with radiation down the lower back to the left leg  - Using gabapentin as directed, 300mg  BID; not assisting with pain control well  - Using Tylenol/Motrin regularly as well, but trying to limit her Motrin given her kidney function  - Has been walking a lot from a recent trip to [place] with multiple cruises, feels like pain never improved during this time    - Required a wheelchair to get off the cruise ship due to her pain  - Has been using muscle massage and  heat on the area, but hasn't helped      All other systems are negative unless otherwise noted in the HPI.    OBJECTIVE      BP 135/54 (SITE: left arm, Orthostatic Position: sitting, Cuff Size: large)   Pulse 68   Temp 36.3 C (97.3 F) (Temporal)   Ht 1.575 m (5\' 2" )   Wt 73 kg (160 lb 15 oz)   LMP 05/06/1983   SpO2 98%   BMI 29.44 kg/m      Physical Exam:  General Appearance: healthy, alert, no distress, pleasant affect, cooperative  Neck:  Neck supple  Heart:  normal rate and regular rhythm, no murmurs, clicks, or gallops  Lungs: clear to auscultation, no wheezes, rales, rhonchi  Skin (lower right back): erythematous, raised patch of urticaria vs eczema just underneath bra line; no discrete nodules/cysts; no discharge or excess heat to skin      Total time I spent in care of this patient today (excluding  time spent on other billable services) was as per time documentation captured by EMR unless otherwise noted below.  25        Electronically signed by:  Linus Mako, FNP-BC  Nurse Practitioner II  [facility]      Supervising Physician: Dr. Shawnee Knapp

## 2023-03-13 NOTE — Nursing Note (Signed)
Vital signs taken, allergies verified, screened for pain, tobacco hx verified.    Paula Suppes, MA II       I have asked the patient if they have received any medical treatment or consultation outside of the [facility].  The patient has indicated (yes or no) to receiving consults or services outside the [facility] system.  If yes, we have requested information to ascertain these results.

## 2023-03-18 ENCOUNTER — Ambulatory Visit: Payer: Medicare Other | Attending: "Endocrinology | Admitting: "Endocrinology

## 2023-03-18 VITALS — BP 140/71 | HR 64 | Temp 97.3°F | Ht 62.0 in | Wt 166.4 lb

## 2023-03-18 DIAGNOSIS — Z794 Long term (current) use of insulin: Secondary | ICD-10-CM | POA: Insufficient documentation

## 2023-03-18 DIAGNOSIS — E1129 Type 2 diabetes mellitus with other diabetic kidney complication: Secondary | ICD-10-CM | POA: Insufficient documentation

## 2023-03-18 DIAGNOSIS — Z7984 Long term (current) use of oral hypoglycemic drugs: Secondary | ICD-10-CM | POA: Insufficient documentation

## 2023-03-18 MED ORDER — ONDANSETRON 8 MG DISINTEGRATING TABLET: 8.0000 mg | DISINTEGRATING_TABLET | Freq: Three times a day (TID) | ORAL | 0 refills | Status: DC | PRN

## 2023-03-18 NOTE — Nursing Note (Signed)
Patient identified x2, vital signs taken, allergies verified, screened for pain, tobacco use screened. Pharmacy confirmed. Proper PPE worn: Mask worn while taking patients vitals.  Downloaded meter.     Lenee Franze, MA II

## 2023-03-18 NOTE — Progress Notes (Signed)
Endocrinology Follow up clinic note:    Chief Complaint:  "I'm here to follow up on my diabetes."    HPI: Paula Daugherty is a 77yr old female  who has a diagnosis of type 2 diabetes mellitus who presents to Endocrinology for follow up. Her history is as follows:  She was initially diagnosed with diabetes around 2000 on routine labs. She has a strong family history of diabetes so she wasn't surprised at this. She was started on Metformin around 2005 and then she was started on insulin in 2014. She was up to 64 units of Lantus at night. However, after making changes to her diet and lifestyle, she was taken off insulin in 2015. Basal insulin was added back to her regimen in 2018 and she was later able to discontinue this.      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     Interim History: She returns today for follow up. Her last visit with Endocrinology was on 09/10/2022. Since this visit, she has been dealing with many health issues, most notably spinal stenosis which is impacting her mobility and causing severe sciatic pain. She has follow up scheduled with the spine clinic to discuss further treatment options.   Her glucose has been running slightly higher and she attributes this to increased pain, decreased activity overall and some weight gain with recent trip.   She is currently taking metformin 1000 mg daily and is tolerating this well. She denies any recent hypoglycemia or symptoms of hypoglycemia.   AM glucose: 133 to 232  Afternoon glucose: 127 to 239    ROS:  All other systems were reviewed and are negatative except for pertinent positive and negative responses as documented in HPI.     Medications:  Medication reconciliation was performed today.   Medications Taking[1]    I did review patient's past medical and family/social history, no changes noted.   PMH:  Past medical history was reviewed from problem list.   Problem List[2]    VITAL SIGNS:  BP (!) 140/71 (SITE: left  arm, Orthostatic Position: sitting, Cuff Size: large)   Pulse 64   Temp 36.3 C (97.3 F) (Temporal)   Ht 1.575 m (5\' 2" )   Wt 75.5 kg (166 lb 7.2 oz)   LMP 05/06/1983   SpO2 99%   BMI 30.44 kg/m   Body mass index is 30.44 kg/m.    PHYSICAL EXAM:  General Appearance: healthy, alert, no distress, pleasant affect, cooperative.  Eyes:  conjunctivae and corneas clear. EOM's intact. sclerae normal.  Neck:  Neck supple. No adenopathy, thyroid symmetric, normal size.  Heart:  normal rate and regular rhythm, no murmurs, clicks, or gallops.  Lungs: clear to auscultation.  Extremities:  no cyanosis, clubbing, or edema.  Skin:  Skin color, texture, turgor normal. No rashes or lesions.  Neuro: No tremor appreciated.   Mental Status: Appearance/Cooperation: in no apparent distress and well developed and well nourished  Eye Contact: normal  Speech: normal volume, rate, and pitch    LAB TESTS/STUDIES:   I personally reviewed the following  laboratory and/or imaging studies .   12/19/22 07:23 12/25/22 10:09 03/11/23 10:26   Sodium 143 141 141   Potassium 4.3 4.5 4.3   Chloride 106 105 105   Carbon Dioxide Total 22 23 24    Anion Gap 15 13 12    Urea Nitrogen, Blood (BUN) 26 (H) 27 (H) 22 (H)   Creatinine Blood 1.19 (H)  1.35 (H) 1.22 (H)   Glucose 142 (H) 108 132 (H)   Calcium 9.5 9.3 9.3   Protein 7.7     Albumin 4.0     Alkaline Phosphatase (ALP) 119     Aspartate Transaminase (AST) 21     Bilirubin Total 1.0     Alanine Transferase (ALT) 18     E-GFR Creatinine (Female) 48 41 46   Cholesterol  108    Triglyceride  147    LDL Cholesterol Calculation  26    HDL Cholesterol  53    Non-HDL Cholesterol  55    Total Cholesterol:HDL Ratio  2.0    Ferritin 48 33 24   Folate  >20.0    Hgb A1C   6.8 (H)   Hgb A1C,Glucose Est Avg   148   Vitamin D, 25 Hydroxy  50.1 (H)       03/11/23 10:26   White Blood Cell Count 6.6   Red Blood Cell Count 4.26   Hemoglobin 12.1   Hematocrit 36.4   MCV 85.3   MCH 28.3   MCHC 33.2   RDW 15.0 (H)    Platelet Count 198      02/10/22 09:30   Creatinine Spot Urine 131.3  131.3   Microalbumin Urine 19.0   Microalbumin/Creatinine Ratio 145     Thyroid ultrasound (09/25/2021):  FINDINGS:  Parenchymal echotexture: heterogeneous.  Right lobe: 3.9 x 1.4 x 1.9 cm (5.1 cc).  Nodules:  Subcentimeter nodule(s) present but none that meet criteria for FNA or  follow up.  Left lobe: 4.1 x 1.7 x 2.1 cm (6.9 cc).  Nodules:  1. Mid lobe, 0.9 cm, solid or almost completely solid (2 pts),  hypoechoic (2 pts), wider-than-tall (0 pt), with smooth margins (0 pt).  Macrocalcifications: absent (0 pt). Peripheral rim calcification: absent (0  pt). Punctate echogenic foci: none (0 pt). TR4 - Moderately suspicious.  Subcentimeter nodule(s) present but none that meet criteria for FNA or  follow up.  Isthmus: 0.3 cm.  Nodules:  none.  No abnormal cervical lymph nodes.      IMPRESSION:  1. No nodules meet criteria for FNA or follow-up.    Impression: This is a 77yr old female with Diabetes Mellitus Type II who presents to Endocrinology for follow up. Overall, her glycemic control is at goal as evidenced by her most recent A1c of 6.8%. Review of her glucose trends on her meter download demonstrates that for the most part, this is well controlled and I have not recommended any changes to her medications today, however, she will continue to monitor her glucose closely and follow up with glycemic trends by mychart.  We discussed that she should increase her metformin to BID dosing if her glucose is elevated above goal.      She also has a history of an adrenal nodule. Hormonal evaluation was last done and was normal in 04/2021.      DIABETES HISTORY  (-) h/o retinopathy.      (-) h/o microalbuminuria/nephropathy.  (-) h/o neuropathy.  (-) h/o Autonomic dysfunction  (-) h/o Gastroparesis  (-) h/o CAD  (-) h/o PVD     Last eye exam: 03/2022, f/up 1 year  Last urine microalbumin: 02/2022, 145  Pneumovax: 12/2014, Prevnar 2018  Influenza vaccine:  fall 2024  (-) ASA  (+) Statin  (+) ACE/ARB          Recommendations:  (E11.29) Type 2 diabetes mellitus with other diabetic kidney complication  (  primary encounter diagnosis)  - Continue current dose metformin, will increase to BID dosing if glucose is persistently above goal  - Encouraged patient to continue close blood glucose monitoring, will notify me on glucose trends at home  - Last LDL at goal, continue statin, repeat due 02/2023  - Last microalbumin:creatinine ratio elevated, continue cozaar, repeat due 12/2023  - Up to date on screening eye exam, f/up as scheduled in 04/2023  - Repeat A1c prior to follow up appointment  - Influenza vaccine up to date  - Discussed daily foot care     Approximately 20 minutes were spent with patient, greater than 50% of which was spent counseling the patient on diabetes management and on coordination of care.    Follow up in 3 months    If you have any questions, please do not hesitate to contact me at [phone].  Thank you for allowing me to participate in the care of this patient.    EDUCATION:  I educated/instructed the patient or caregiver regarding all aspects of the above stated plan of care.  The patient or caregiver indicated understanding.      [employer] interpreter was not used.    Report electronically signed by:  Alonza Smoker, M.D.  Clinical Associate Professor  Department of Endocrinology              [1]   Outpatient Medications Marked as Taking for the 03/18/23 encounter (Office Visit) with Ella Jubilee, MD   Medication Sig Dispense Refill    Amlodipine (NORVASC) 10 mg Tablet Take 1 tablet by mouth every day. 90 tablet 3    Atorvastatin (LIPITOR) 20 mg Tablet Take 1 tablet by mouth every day at bedtime. 90 tablet 3    Blood Sugar Diagnostic (ONETOUCH ULTRA TEST) Strips Use to test blood glucose 2x/day 100 strip 11    CloNIDine (CATAPRES) 0.1 mg Tablet Take 2 tablets by mouth every day at bedtime. Indications: "change of life" signs 180 tablet 3     Clopidogrel (PLAVIX) 75 mg Tablet Take 1 tablet by mouth every morning. 90 tablet 3    Gabapentin (NEURONTIN) 300 mg Capsule Take 1 capsule by mouth every morning AND 2 capsules every day at bedtime. If needed, OK to take 1 capsule by mouth at mid-day for pain control. 180 capsule 1    Losartan (COZAAR) 100 mg tablet Take 1 tablet by mouth every day. 90 tablet 3    Metformin (GLUCOPHAGE) 1,000 mg tablet Take 1 tablet by mouth once daily with a meal. 90 tablet 3    Metoclopramide (REGLAN) 5 mg Tablet Take 1 tablet by mouth once daily if needed (for nausea).      NYSTATIN 100,000 unit/gram Powder Apply a thin layer of powder to affected area 4 times a day as needed 60 g 1    Ondansetron (ZOFRAN-ODT) 8 mg disintegrating tablet Take 1 tablet by mouth every 8 hours if needed. 100 tablet 0   [2]   Patient Active Problem List  Diagnosis    Diabetes (HCC)    Depression with anxiety    Stress at home    Fibromyalgia    Hypertension    GERD (gastroesophageal reflux disease)    Hyperlipidemia with target LDL less than 70    Abnormal LFTs    Renal cyst ( 6.4 cm cyst left kidney)    Cough    Disorder of liver    Macroalbuminuric diabetic nephropathy (HCC)    History of actinic keratoses  Cataract    Ptosis of eyelid    Liver fibrosis    Adrenal nodule (HCC)    Medication Therapy Auth    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HCC)    Mixed conductive and sensorineural hearing loss of both ears    Neoplasm of uncertain behavior of skin    Abdominal pain, generalized    Nausea without vomiting    Asymptomatic microscopic hematuria    Subcutaneous nodule    Angina pectoris (HCC)    Mild episode of recurrent major depressive disorder (HCC)    NASH (nonalcoholic steatohepatitis)    Iron deficiency anemia due to chronic blood loss    Kidney disorder

## 2023-03-23 ENCOUNTER — Ambulatory Visit: Payer: Medicare Other | Admitting: Neurology

## 2023-03-23 ENCOUNTER — Encounter: Payer: Self-pay | Admitting: Student in an Organized Health Care Education/Training Program

## 2023-03-23 ENCOUNTER — Encounter: Payer: Self-pay | Admitting: GASTROENTEROLOGY

## 2023-03-23 NOTE — Telephone Encounter (Signed)
Forwarding to Provider to review and respond to patient.  Gabapentin not helping with pain    Recent Visits  Date Type Provider Dept   03/13/23 Office Visit Linna Caprice, NP [facility]   02/05/23 Office Visit Garth Bigness, MD [facility]   01/23/23 Office Visit Synetta Fail, Campbell Riches, NP [facility]   12/25/22 Office Visit Garth Bigness, MD [facility]   11/14/22 Office Visit Garth Bigness, MD [facility]   10/15/22 Office Visit Garth Bigness, MD [facility]   09/18/22 Office Visit Garth Bigness, MD [facility]   07/23/22 Office Visit Garth Bigness, MD [facility]   06/24/22 Office Visit Garth Bigness, MD [facility]   04/17/22 Office Visit Garth Bigness, MD [facility]   Showing recent visits within past 540 days with a meds authorizing provider and meeting all other requirements  Future Appointments  Date Type Provider Dept   04/01/23 Appointment Garth Bigness, MD [facility]   06/25/23 Appointment Garth Bigness, MD [facility]   Showing future appointments within next 150 days with a meds authorizing provider and meeting all other requirements

## 2023-03-25 ENCOUNTER — Ambulatory Visit: Payer: Medicare Other | Admitting: Student in an Organized Health Care Education/Training Program

## 2023-03-25 DIAGNOSIS — Z79899 Other long term (current) drug therapy: Secondary | ICD-10-CM

## 2023-03-25 DIAGNOSIS — M5116 Intervertebral disc disorders with radiculopathy, lumbar region: Secondary | ICD-10-CM

## 2023-03-25 DIAGNOSIS — M5442 Lumbago with sciatica, left side: Secondary | ICD-10-CM

## 2023-03-25 MED ORDER — PREGABALIN 100 MG CAPSULE
100.0000 mg | ORAL_CAPSULE | Freq: Two times a day (BID) | ORAL | 1 refills | Status: DC
Start: 2023-03-25 — End: 2024-03-19

## 2023-03-25 MED ORDER — PREGABALIN 25 MG CAPSULE
ORAL_CAPSULE | ORAL | 0 refills | Status: DC
Start: 2023-03-25 — End: 2023-04-15

## 2023-03-25 NOTE — Progress Notes (Unsigned)
[facility] FOLLOW-UP PATIENT VISIT          RE: Paula Daugherty  MRN: [id number]  DOB: [birth year]  Date: 03/25/2023    Garth Bigness, MD  [address]  [place] [address]      Dear Dr Delford Field, Richelle Ito,     I had the pleasure of seeing Paula Daugherty as a follow-up in the Neurovascular clinic 03/25/2023  for stroke. I am sure you are aware of the history, but allow me to summarize it here for our records.    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 In June 2024 she presented to [facility] for speech changes and right sided numbness. She presented 2 days after symptom onset, and was seen by neurologist Dr. Richarda Osmond during the hospitalization. Her symptoms improved with her numbness resolving, but she still feels she has some word finding difficulties at times, is more easily frustrated and angry than before and is more easily startled. Outside records note NIHSS of 0 at time of hospital evaluation. I last saw her in clinic 12/25/22 at which time she denied having any new neurological symptoms, but had residual word finding difficulties. Since last appointment she completed a repeat CTH on my recommendation given no stroke seen on prior imaging as well as 14 day ziopatch. Otherwise, patient's symptoms have been stable with persistent word finding difficulties and now new focal symptoms. Has stopped ASA as instructed since last visit, continues atorvastatin and plavix as prescribed.    Review of Symptoms:  A complete review of systems was performed and was unremarkable except for those listed in the HPI.    No shortness of breath, no chest pain, no palpitations, no fever or chills, no weight loss, no rash, no joint swelling, no headache, no change in mood, no change in sleep, no change in memory, no  vision change.  Headaches 1x/month, typical quality/quantity/intensity  Dizziness/lightheadedness upon standing, stable since last visit  Sciatic pain  Reduced visual acuity, pending eye exam  Left leg worsening sciatica with pain, numbness, weakness, parasthesias  Daily rectal bleeding, has not changed since stroke or starting ASA/plavix medications, believes is from hemorroids  back pain    Past Medical History  Past Medical History[1]    Prior Surgeries  Past Surgical History[2]    Medications   verbally reviewed the medications with patient and can verify only the medication I personally prescribed, which included none of these):  Medications Ordered Prior to Encounter[3]      Allergies/Intolerances  is allergic to sulfa (sulfonamide antibiotics), contrast dye [radiopaque agent], hydrochlorothiazide, januvia [sitagliptin], keflex [cephalexin], lyrica [pregabalin], omnipaque [iohexol], prozac [fluoxetine hcl], and topiramate.    Although notes distant CT contrast allergy, has tolerated CT contrast recently without premedication    The patient is not able to have an MRI due to metal pieces in her ears bilaterally for otosclerosis     Family History  No family history of early onset stroke, myocardial infarction or peripheral vascular disease.  No family history of coagulation disorder.    family history includes Diabetes in her father; Heart in her mother; Non-contributory in her brother and brother.    Heart disease on both sides, not certain if family has had stroke history, possible mini-strokes in mother, possible dementia  Social History  Tobacco: not current, former  EtOH: typoically none, for special occasions only  Recreational drugs: denies  Occupation: previously worked as Artist in hospital  Lives in [place]     reports that she has quit smoking. Her smoking use included cigarettes. She has a 2 pack-year smoking history. She has never been exposed to tobacco smoke. She has never used  smokeless tobacco. She reports current alcohol use. She reports that she does not use drugs.    Examination  Last menstrual period 05/06/1983.  There is no height or weight on file to calculate BMI.    PHYSICAL EXAMINATION:   General Appearance: Well developed, well-appearing, in no acute distress.   Skin: Anicteric, no rashes, no bruises.   HEENT: Head normocephalic and atraumatic. Oropharynx normal with moist mucous membranes. Neck was supple.   Cardiac: Warm and well perfused, no cyanosis.  Pulmonary: Breathing is non-labored. Breathing comfortably on room air.  GI: Nondistended.  Extremities: Warm and well perfused without edema. No cyanosis; no limitation of ROM at joints.    NEUROLOGICAL EXAMINATION:   Mental status: Awake, alert; oriented to person, place and date; speech is fluent with intact naming and repetition but had some delay in remembering the word "hammock" on stroke cards; normal comprehension and able to follow multiple step commands across the midline; memory intact to the details of the history. There is an adequate fund of recent and remote information. No apraxia. No left-right disorientation.    Cranial nerves: PERRL, VFFTC, EOMI with end gaze nystagmus that resolves within 3 beats, facial strength intact, facial sensation intact, hearing intact to conversation (L>R hearing ;loss at baseline from otosclerosis, cannot hear finger rub on left), tongue/palate midline, no dysarthria.    Motor: Normal muscle bulk and tone of arms and legs. No fasciculations or involuntary movements. There is no tremor at rest in arms or legs. No pronator drift. Fast and symmetric finger/foot taps   Delt  Biceps  Triceps  WE  FE  IO   Right  5  5  5  5  5  5    Left  5  5  5  5  5  5       IP  Quads  Hams TA    Right  5  5  5 5     Left  5  5  5 5       Coordination: No Finger-nose-finger or heel to shin dysmetria.    Sensation: intact to light touch.    Gait: Normal, narrow-based gait with normal stride  length.      Investigations  Labs  Lab Results   Component Value Date    WBC 6.6 03/11/2023    HGB 12.1 03/11/2023    HCT 36.4 03/11/2023    MCV 85.3 03/11/2023    PLT 198 03/11/2023     Lab Results   Component Value Date    NA 141 03/11/2023    K 4.3 03/11/2023    CL 105 03/11/2023    CO2 24 03/11/2023    BUN 22 (H) 03/11/2023    CR 1.22 (H) 03/11/2023    GLU 132 (H) 03/11/2023     Lab Results   Component Value Date    TBIL 1.0 12/19/2022    AST 21 12/19/2022    ALT 18 12/19/2022    ALP 119 12/19/2022    ALB 4.0 12/19/2022     Lab Results   Component Value Date  CHOL 108 12/25/2022    HDL 53 12/25/2022    LDLC 26 12/25/2022    CHOLHDL 2.0 12/25/2022    TRIG 147 12/25/2022    NONHDLCHOL 55 12/25/2022     Lab Results   Component Value Date    HGBA1C 6.8 (H) 03/11/2023    HGBA1C 6.4 (H) 09/09/2022    HGBA1C 5.8 (H) 06/09/2022     Lab Results   Component Value Date    INR 1.11 12/19/2022    INR 1.13 06/13/2022    INR 1.08 11/08/2021          Imaging:  I personally reviewed and interpreted the following studies and agree with the radiology interpretations included below:     [facility] 02/07/23 (to my read additional possible evolving subacute-chronic lacune at left GPe)  FINDINGS:  Brain:   There is atheromatous calcification of the cavernous internal carotid  arteries left greater than right. There is no evidence of mass lesion,  active infarct, hemorrhage or hydrocephalus. Minor probably old infarctive  hypodensity is seen in the left insular area.  Bones and orbits: Prior bilateral cataract surgery is suggested.  Soft tissues: Normal.  Paranasal sinuses and mastoid air cells: Right maxillary sinus is slightly  hypoplastic. Very minimal mucosal disease is seen in the floor of each  maxillary chamber.  IMPRESSION:  1. No acute intracranial hemorrhage or mass effect. Possible old left  subinsular infarctive change.    [facility] CT Angio Head+Neck w Contrast 10/06/2022 3:23 PM  (to my read left MCA ICAD causing moderate  rather than mild stenosis)  FINDINGS:  CTA HEAD:  Intracranial ICAs: Moderate calcification without significant stenosis.  ACAs and AComm: No aneurysm or significant stenosis.  MCAs: No aneurysm or occlusion. Mild focal stenosis in left proximal M2 (7-50).  PCAs and PComms: No aneurysm or significant stenosis.  Vertebral, basilar, and cerebellar arteries: No aneurysm or significant stenosis.  Other: No midline shift or hydrocephalus.  CTA NECK:  Right carotid: Calcified plaque at the carotid bifurcation without significant stenosis.  Left carotid: Partly calcified plaque at carotid bifurcation without significant stenosis.  Vertebral: No significant stenosis.  Arch: No aneurysm or dissection.  Other: No neck mass or lymphadenopathy. Clear lung apices.  DELAYED CT HEAD: No abnormal intracranial enhancement.  IMPRESSION:  CTA head: No large vessel occlusion. Mild focal stenosis in left M2, likely due to atherosclerosis.  CTA neck: No significant stenosis.    I did not personally review these studies as they were not available:     [facility] CT Head wo Con 10/06/2022 12:52 PM  FINDINGS:  No acute intracranial hemorrhage, mass effect, shift. No extra-axial fluid collections identified. Ventricles and sulci are unremarkable for age. There is deep white matter periventricular areas Daugherty-density.  Patient appears to be status post bilateral cataract surgery. Paranasal sinuses and mastoid air cells are clear.  IMPRESSION:  No acute intracranial hemorrhage, mass effect, midline shift.  Deep white matter periventricular areas of Daugherty density likely represent chronic small vessel ischemic changes although acute superimposed ischemia is not excluded.    [facility] US Vasc Dplx Education officer, community Art Bilat Comp 10/06/2022 1:11 PM   ATHEROSCLEROTIC PLAQUING:  RIGHT BIFURCATION: Mild to moderate  LEFT BIFURCATION: Mild to moderate  DOPPLER FINDINGS:  Peak Systolic End Diastolic  RIGHT CCA 78 cm/s 14 cm/s  RIGHT prox ICA 47 cm/s 12  cm/s  RIGHT middle ICA 54 cm/s 12 cm/s  RIGHT distal ICA 59 cm/s 17 cm/s  RIGHT ECA  57 cm/s  RIGHT VERT 40 cm/s  LEFT CCA 58 cm/s 13 cm/s  LEFT prox ICA 40 cm/s 11 cm/s  LEFT middle ICA 53 cm/s 18 cm/s  LEFT distal ICA 55 cm/s 18 cm/s  LEFT ECA 59 cm/s  LEFT VERT 31 cm/s  RIGHT ICA/CCA RATIO: 0.8  LEFT ICA/CCA RATIO: 1.0  VERTEBRAL ARTERIES:  RIGHT: Antegrade  LEFT: Antegrade  IMPRESSION:  No evidence of hemodynamically significant stenosis by velocities.     Other Studies  Ziopatch 01/17/23-01/31/2023       [facility] TTE 11/28/22:  SUMMARY: 1. Left ventricular ejection fraction, by Simpson's Biplane, is normal at 64 %. 2. There is no evidence of pericardial effusion. 3. Left ventricular diastolic function is indeterminate. 4. Mild concentric left ventricular hypertrophy. 5. Normal right ventricular size, wall thickness, and systolic function. 6. The left atrium is normal in size and structure. 7. The right atrium is normal in size and structure. 8. The tricuspid regurgitation jet is inadequate to estimate RVSP.    [facility] ECG 10/06/22 15:34   Sinus bradycardia, rate 57, incomplete right bundle branch block     Assessment:   Karelyn Oas is a 54yr right handed female with a past medical history of HTN, DM who presented in June 2024 to [facility] for reported resolved right sided weakness and speech difficulties. She continues to have residual symptoms of mild word finding difficulties. Since then symptoms have been stable. Vitals and exam today remarkable for hypertension, and no clear focal deficits but endorses subjective word finding difficulties in spontaneous speech. Stroke relevant work-up notable for last LDL 26 and A1c 6.8, repeat CTH with no area of acute infarction with possible chronic subinsular infarct as well as possible small left globus pallidus lacune not as clear on prior CTH, and CTA with left M2 mild stenosis. TTE with no clear atrial cardiopathy, reduced EF or thrombus evident. She completed a 14  day ziopatch with no evidence of atrial fibrillation. Unable to get MRI given otosclerosis implants. My leading clinical suspicion for this presentation is small acute stroke, with most likely stroke etiology large vessel disease (ICAD) given M2 stenosis, cortical symptoms (speech) and chronic infarct in potential perfusion territory of this vessel. Other considerations include ESUS or small vessel disease (in particular in light of clear microvascular disease burden as well as GPe lacune evolution that may have coincided with her presentation). Recommend aggressive vascular risk factor modification with gradual blood pressure reduction so as to avoid hypoperfusion of M2 vessel.     mRS 1    Plan:  #Stroke by clinical criteria  # Microvascular Disease  # Chronic lacunes  -- Continue plavix 75mg  daily  - in the future if any issues with medication interactions (ie. Ppi's) or if needs to be held for surgery, alternative would be ASA 81mg  daily and to limit duration antiplatelet agent is held.  -- Long term blood pressure goal 110-130/60-80 with gradual reduction  -- LDL goal < 70, current with Daugherty LDL, can downtitrate atorvastatin to 10mg  given theoretical bleeding risk with very Daugherty LDL, please address with PCP (patient reports will have appointment next week)  -- Euglycemic goal  -- Close follow-up with PMD to optimize the above vascular risk factors (Goal BP 110-130/60-80,  A1c < 7, LDL < 70)  -- Encouraged physical activity (at least 52min/day)    -- Follow-up in stroke clinic as needed.    Education:   I explained to Paula Daugherty the diagnosis, the pathogenesis of  the medical condition, the proposed plan and the treatment rationale. All available imaging studies were reviewed with the patient.  Any questions were addressed.       The risk of future stroke was discussed. The patient was instructed to call 911 should new stroke symptoms develop. The risk of medications was addressed including the risk of hemorrhage  associated with antiplatelets and anticoagulants.    Sincerely,    Jacqulyn Cane, DO MS    Today's visit has complexity inherent to evaluation and management associated with medical care services that are part of ongoing care related to a patient's single, serious condition or a complex condition, which is stroke.    A total of 60 minutes was spent in patient care,  > 50% of that time was spent counseling and/or coordinating care on diagnosis, treatment risk/benefit/alternatives, prognosis. The patient understands and agrees with the plan of care outlined.            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    Psychiatric illness     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   [2]   Past Surgical History:  Procedure Laterality Date    BIOFEEDBACK PERI/URO/RECTAL  10/21/2017    session #1: N Score = 5.4    BIOFEEDBACK PERI/URO/RECTAL  11/04/2017    session #2    BIOFEEDBACK PERI/URO/RECTAL  11/18/2017    Session #3    BIOFEEDBACK PERI/URO/RECTAL  12/02/2017    Session #4    BIOFEEDBACK PERI/URO/RECTAL  12/30/2017    Session #5    BIOFEEDBACK PERI/URO/RECTAL  03/23/2018    Session #6    CAPSULE ENDOSCOPY  01/27/2018    CAPSULE ENDOSCOPY  01/29/2018         CAPSULE ENDOSCOPY  12/23/2021    wnl    COLONOSCOPY  01/04/2014    polyp--TA and erosion; hemorrhoids; tics; repeat x 3 yrs    COLONOSCOPY  12/16/2017    TA; repeat in 3 yrs    COLONOSCOPY  04/05/2021    polyps--path pending; hemorrhoids    EGD  04/2020    gastritis    EGD  04/08/2019    biopsy path--pending    EGD   04/05/2021    biopsies at tattoo--path pending    [facility] BIOFEEDBACK PERI/URO/RECTAL  05/02/2018         HC COLONOSCOPY,REMV LESN,SNARE  12/16/2017    MAMMOPLASTY, REDUCTION  1985    MANOMETRY  09/02/2017    work on constipation; kegels and biofeedback; proceed w/colonoscopy     PHACOEMULSIFICATION, CATARACT Right 02/12/2015    with IOL    PR ESOPHAGOGASTRODUODENOSCOPY TRANSORAL DIAGNOSTIC  02/12/2016    EGD--polyp--path pending; antral gastritis; no varices     PR ESOPHAGOGASTRODUODENOSCOPY TRANSORAL DIAGNOSTIC  06/11/2016    EGD--biopsy at site of carcinoid tumor path pending  PR ESOPHAGOGASTRODUODENOSCOPY TRANSORAL DIAGNOSTIC      EGD--folds in duodenum--path pending     PR ESOPHAGOGASTRODUODENOSCOPY TRANSORAL DIAGNOSTIC  04/2017    EGD; repeat EGD in 1 year     PR ESOPHAGOGASTRODUODENOSCOPY TRANSORAL DIAGNOSTIC  04/20/2018    gastritis; nodule--path pending    PR KNEE SCOPE,DIAGNOSTIC  1980s    Knee arthroscopy    PR LIGATE FALLOPIAN TUBE      Tubal ligation    PR REPAIR TYMPANIC MEMBRANE      Tympanoplasty    PR TOTAL ABDOM HYSTERECTOMY  1980s    partial; no BSO    REPAIR, BLEPHAROPTOSIS Bilateral 08/06/2015    Blepharoplasty bilateral upper lids   [3]   Current Outpatient Medications on File Prior to Visit   Medication Sig Dispense Refill    Albuterol (PROAIR HFA, PROVENTIL HFA, VENTOLIN HFA) 90 mcg/actuation inhaler INHALE 1 TO 2 PUFFS BY MOUTH EVERY 6 HOURS AS NEEDED FOR WHEEZING 8.5 g 0    Amlodipine (NORVASC) 10 mg Tablet Take 1 tablet by mouth every day. 90 tablet 3    Atorvastatin (LIPITOR) 20 mg Tablet Take 1 tablet by mouth every day at bedtime. 90 tablet 3    Blood Glucose Meter (ONETOUCH ULTRA2 METER) Kit 1 kit one time. 1 kit 0    Blood Sugar Diagnostic (ONETOUCH ULTRA TEST) Strips Use to test blood glucose 2x/day 100 strip 11    CloNIDine (CATAPRES) 0.1 mg Tablet Take 2 tablets by mouth every day at bedtime. Indications: "change of life" signs 180 tablet 3    Clopidogrel (PLAVIX) 75 mg Tablet Take 1  tablet by mouth every morning. 90 tablet 3    Hydrocortisone (ANUSOL-HC) 25 mg Suppository Insert 1 suppository into the rectum every 12 hours. 12 suppository 0    Losartan (COZAAR) 100 mg tablet Take 1 tablet by mouth every day. 90 tablet 3    Metformin (GLUCOPHAGE) 1,000 mg tablet Take 1 tablet by mouth once daily with a meal. 90 tablet 3    Metoclopramide (REGLAN) 5 mg Tablet Take 1 tablet by mouth once daily if needed (for nausea).      NYSTATIN 100,000 unit/gram Powder Apply a thin layer of powder to affected area 4 times a day as needed 60 g 1    Ondansetron (ZOFRAN-ODT) 8 mg disintegrating tablet Take 1 tablet by mouth every 8 hours if needed. 100 tablet 0    Pregabalin (LYRICA) 100 mg capsule Take 1 capsule by mouth 2 times daily. Start after ramp up prescription 60 capsule 1    Pregabalin (LYRICA) 25 mg Capsule Take 1 capsule by mouth every day for 7 days, THEN 1 capsule 2 times daily for 7 days, THEN 2 capsules 2 times daily for 7 days. 49 capsule 0    Triamcinolone (KENALOG) 0.1 % Cream Apply to the affected area 2 times daily. 30 g 1     No current facility-administered medications on file prior to visit.

## 2023-03-25 NOTE — Progress Notes (Signed)
Telemedicine Visit   The patient was presented with information regarding the risks and benefits of telemedicine, given the opportunity to ask questions, and verbally consented to participate in a Telephone today.   The patient confirmed that they are located in the state of [place].   I performed this encounter via telephone and no video was used.    ASSESSMENT & PLAN      Lumbar disc disease with radiculopathy  (primary encounter diagnosis)    Assessment & Plan  Left-sided Sciatica  No relief with current Gabapentin regimen (300mg  AM, 600mg  PM). Discussed alternative options including Lyrica and Buprenorphine. Patient has a history of a reaction to Lyrica, but details are unclear and many years ago. She would like to try it again.   -Discontinue Gabapentin over the next week, wean from 300 qam and 600 qpm   -Start Lyrica 25mg  daily for 7 days, then 25mg  twice daily for 7 days, then 50mg  twice daily for 7 days, then increase to 100mg  twice daily.  -Refer to pain pharmacy to discuss potential use of Buprenorphine for pain management.          SUBJECTIVE      Paula Daugherty is a 77yr old female.    History of Present Illness  Paula Daugherty, a 77 year old female, presents for a follow-up visit regarding her left-sided sciatica pain. Despite being on gabapentin (300mg  in the morning and 600mg  at night), she reports no relief from her symptoms. The patient has a history of a possible reaction to Lyrica, a medication suggested as an alternative to gabapentin. The reaction was reported in 2015, but the patient recalls it occurring much earlier. She is unsure of the nature of the reaction but is willing to try Lyrica again for pain management. The patient also mentions her husband, DJ, who is experiencing health issues, including wheezing and severe coughing.       OBJECTIVE      Her vitals were not taken for this visit.     Physical Exam         Gen: appears well, NAD  Chest: non labored breathing, speaking in full  sentences   Psych: normal mood and affect     Results         Total time I spent in care of this patient today (excluding time spent on other billable services) was 20 minutes.     I obtained verbal consent from the patient to use AI ambient technology to transcribe the interactions between the patient and myself during the clinical encounter.    Today's visit has complexity inherent to evaluation and management associated with medical care services that serve as the continuing focal point for all needed health care services.    Electronically signed by:  Wyatt Portela, MD  Associate Physician  [facility]

## 2023-03-25 NOTE — Patient Instructions (Signed)
Start lyrica ramp up   Wean off gabapentin   Pharmacy referral in place to discuss alternatives

## 2023-03-26 ENCOUNTER — Encounter: Payer: Self-pay | Admitting: NEUROLOGY

## 2023-03-26 ENCOUNTER — Ambulatory Visit: Payer: Medicare Other | Attending: NEUROLOGY | Admitting: NEUROLOGY

## 2023-03-26 VITALS — BP 136/70 | HR 62 | Temp 97.5°F | Ht 62.0 in | Wt 167.1 lb

## 2023-03-26 DIAGNOSIS — H318 Other specified disorders of choroid: Secondary | ICD-10-CM | POA: Insufficient documentation

## 2023-03-26 DIAGNOSIS — I2585 Chronic coronary microvascular dysfunction: Secondary | ICD-10-CM | POA: Insufficient documentation

## 2023-03-26 DIAGNOSIS — I639 Cerebral infarction, unspecified: Secondary | ICD-10-CM | POA: Insufficient documentation

## 2023-03-26 NOTE — Nursing Note (Signed)
 Two patient identifiers were used, vitals signs taken, screened for pain, allergies and pharmacy verfied.  Paula Derry Skill, MA II

## 2023-03-26 NOTE — Patient Instructions (Addendum)
It was a pleasure seeing you today.    - Continue Plavix 75 mg daily  - Talk with PCP about decreasing Atorvastatin to 10 mg daily   -  Follow up with Primary Care Physician (PCP) for vascular risk factor management (LDL < 70, HgbA1c < 6.5, normal blood pressure goal < 130/80)  - Follow-up with PCP or GI doctor regarding possible hemorrhoid bleeding    Follow-up as needed.    Jacqulyn Cane, DO MS      Clinic contact information:  Please call [phone] if you have any non-urgent questions or if you would like to schedule an appointment. For emergencies you should call 911.     Education: we want all of our patients to know the warning signs of stroke. Look for weakness on one side, speech that doesn't make sense, slurred speech, feeling that the room is spinning, or unconsciousness. Call 911 immediately! Do not go to sleep or wait for the symptoms to get better; we can treat stroke if you arrive soon enough. For more information go to:   [email or web address].[email or web address]     These recommendations, as well as a summary of today's visit, will be conveyed to your Primary Care Physician.  We recommend a follow-up appointment with your Primary Care Provider to discuss these recommendations.

## 2023-03-26 NOTE — Progress Notes (Signed)
Orthopaedic Spine H&P/ Initial Consultation Note    Date of visit: 03/30/23    Dear patient, if you are reviewing this progress note and have questions about it's meaning or medical terms being used, please schedule an appointment or bring it up at your next follow-up visit.  The purpose of a chart note is for concise and efficient communication between providers .    Thank you Garth Bigness, MD for the kind referral of Paula Daugherty to the [facility].     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     H/o chronic axial back pain that resolved/improved with physical therapy. About 7 months ago started experiencing atraumatic LLE pain. Pain describes as sharp/shooting starting in the left lower back and radiating down the lateral/posterior leg into the ankle and to the foot with associated numbness/tinging. Pain is constant but worst with any prolonged standing/walking and improves mildly with rest. No focal weakness but generalized LLE weakness due to pain.     Tried gabapentin 900mg  today w/out relief. Recently switched to Lyrica 25mg  and slowly working on titration up.   Tried motrin/tylenol.   Has not yet tried any injection. Has referral to PT and scheduled. Tried at home exercises with worsening pain.       Pain descriptors   Pain intensity on average is 7 out of 10.   The quality of  pain is noted as:  sharp and shooting.   The pain is present daily.   Radiation: LLE  Relieving factors: resting  Aggravating factors: standing, walking, over-activity  Bowel/ bladder symptoms: no  Saddle anesthesia:  no    Function:  Problems with ambulation:  yes  Problems with balance:  no  Uses adaptive equipment:  no    The patient has had a trial of:   Physical therapy:  yes   Medications:  yes   Epidural steroid/ nerve root- injections :  no   Radio frequency ablation of facets:  no       PHYSICAL EXAMINATION:   Temp: --  Temp src: --  Pulse: --  BP: --  Resp: --  SpO2: --  Height: --  Weight: --   Constitutional:  Alert, ambulatory without assistance, NAD.            Psych: Oriented x 4  Mood/Affect pleasant.   Skin: No evidence of rash or skin breakdown.   Musculoskeletal/Neurological:   Ambulation: mildly antalgic gait  Patient can heel and toe walk, can squat and rise without assistance  Spine: no midline ttp to l-spine       ROM in back painful with extension>flexion   Sensation: ILT or pinprick in L1 to S2 dermatomes   Reflexes: patellar 1+, achilles 1+ bilaterally;  Clonus: none      Strength   Movement  Hip    flexion  Knee   flexion  Knee    extension   Ankle    dorsiflexion  Ankle   plantar   flexion   Big toe    extension     Root  L1/ 2/ 3  L5/  S1/ S2  L2/ 3/ 4  L4/ 5  S1/ 2  L5/ S1    Nerve  femoral/    spinal n.  sciatic     femoral  deep    peroneal  tibial  deep    peroneal    Muscle  iliopsoas  hamstrings  quads  tibialis   anterior  gastrocnemius ,   soleus  EHL    Right   5/5   5/5   5/5   5/5   5/5   5/5    Left   5/5   5/5   5/5   5/5   5/5   5/5        Foot drop Absent  Bilateral  SLR/ Slump test: positive on left    IMAGING/ DIAGNOSTICS:   Imaging reviewed with the patient today and showed :    Ct lumbar 01/23/23    IMPRESSION:    1. No acute fracture or subluxation of the lumbar spine.  2. Degenerative changes of lower lumbar spine most pronounced at L4-L5  with suspected left subarticular protrusion on bulge contributing  multifactorial moderate spinal canal narrowing.  3. L5-S1 left greater than right mild foraminal narrowing    Lumbar xray's     IMPRESSION:    1. Lumbar disc degeneration and facet  joint arthropathy, more  pronounced in the lower lumbar spine.    IMPRESSION/ RECOMMENDATIONS:  Paula Daugherty presents today with her husband for 7 month h/o Left L5/S1 lumbar radiculopathy with CT lumbar showing moderate central stenosis at L4-L5 and Left L5-S1 NF narrowing. We reviewed her imaging in detail and discussed treatment options including conservative measures such as trial of Esi/TFESI vs possible surgical interventions.   Discussed that as she has not yet had tried any injections its reasonable to try an ESI first prior to any surgical intervention. If sx continue then discussed obtaining a CT myelogram as pt unable to get Mri due to h/o stapedectomy and unsure If the wire is Mri compatible.     RECOMMENDATIONS:  TFESI vs ESI with pain management. Referral provided.   Continue lyrica as tolerated.   F/u in 4-6 weeks ok for VV to re-evaluate sx/discuss injection. If pain continues CT myelogram of L-spine and f/u with Surgeon for surgical discussion.       Paula Daugherty verbalized understanding of the above and She  is in agreement of the plan.    All questions and concerns were addressed.    She was advised to call clinic should there be any worsening of symptoms, further questions or concerns.      Follow up: VV 4-6 weeks.           Total time I spent in care of this patient today (excluding time spent on other billable services) was 45 minutes.    Levada Dy, PA  Dept of Orthopedic Surgery  [facility]  Adult and Pediatric Surgery

## 2023-03-30 ENCOUNTER — Ambulatory Visit: Payer: Medicare Other | Attending: PHYSICIAN ASSISTANT | Admitting: PHYSICIAN ASSISTANT

## 2023-03-30 VITALS — BP 140/66 | HR 67 | Temp 97.5°F | Resp 12

## 2023-03-30 DIAGNOSIS — M4807 Spinal stenosis, lumbosacral region: Secondary | ICD-10-CM | POA: Insufficient documentation

## 2023-03-30 DIAGNOSIS — Z79899 Other long term (current) drug therapy: Secondary | ICD-10-CM | POA: Insufficient documentation

## 2023-03-30 DIAGNOSIS — Z5181 Encounter for therapeutic drug level monitoring: Secondary | ICD-10-CM | POA: Insufficient documentation

## 2023-03-30 DIAGNOSIS — M5116 Intervertebral disc disorders with radiculopathy, lumbar region: Secondary | ICD-10-CM | POA: Insufficient documentation

## 2023-03-30 NOTE — Nursing Note (Signed)
Identified patient using name and date of birth.    Vital signs were taken.          Patient's blood pressure was high upon rooming patient, will have patient relax for 5 minutes and recheck blood pressure.    Informed Paula Daugherty, [name] via vital sheet highlighting the HIGH BP reading(s) to bring to providers attention.    Patient states this reading is her normal, and denies chest pain, blurred vision, dizziness associated with hypertension.    Screened for pain.     [name] MA II

## 2023-04-01 ENCOUNTER — Ambulatory Visit: Payer: Medicare Other | Admitting: Student in an Organized Health Care Education/Training Program

## 2023-04-01 DIAGNOSIS — K7581 Nonalcoholic steatohepatitis (NASH): Secondary | ICD-10-CM

## 2023-04-01 DIAGNOSIS — M5116 Intervertebral disc disorders with radiculopathy, lumbar region: Secondary | ICD-10-CM

## 2023-04-01 DIAGNOSIS — D500 Iron deficiency anemia secondary to blood loss (chronic): Secondary | ICD-10-CM

## 2023-04-01 MED ORDER — PREGABALIN 100 MG CAPSULE
100.0000 mg | ORAL_CAPSULE | Freq: Two times a day (BID) | ORAL | 1 refills | Status: DC
Start: 2023-04-01 — End: 2024-03-26

## 2023-04-01 NOTE — Patient Instructions (Signed)
Thank you for letting me participate in your care.  It was very nice to see you today!    Please go to the pharmacy to pick up your medication. Take as prescribed   If you are checking into lab or x-ray please check into the FRONT DESK first.    If you are not getting your labs done today, you can come in any time during business hours and walk into lab. X-Ray on site also takes walk ins.    IMPORTANT THINGS TO KNOW:   If you had a referral placed today, please give it 10 to 14 business days before calling about the referral as that is the down time it takes for the insurance and referral department to go through the process.    Mychart messages, results, and prescription refills may take up to 72 hours for a response.   Thank you for your patience.    Please call if you have any questions or concerns. Our phone number is [phone]    Follow up as discussed.     FYI:  A new federal rule requires test results to be released to you immediately, even if your provider has not yet had a chance to review and interpret them for you. Your care team may need up to about 3 business days to review your results and explain them, if needed. We appreciate your understanding.     Dr. Deliana Avalos  Family Medicine Physician   [facility] [place]

## 2023-04-01 NOTE — Progress Notes (Signed)
Telemedicine Visit   The patient was presented with information regarding the risks and benefits of telemedicine, given the opportunity to ask questions, and verbally consented to participate in a Video  today.   The patient confirmed that they are located in the state of [place].       ASSESSMENT & PLAN      Iron deficiency anemia due to chronic blood loss  (primary encounter diagnosis)  Lumbar disc disease with radiculopathy  NASH (nonalcoholic steatohepatitis)    Assessment & Plan  Lumbar disc disease with radiculopathy  Chronic, needs improvement.  Persistent pain despite Lyrica and Tylenol Extra Strength. No side effects noted with Lyrica 25mg  BID. Patient has upcoming appointment with pain management.  -Increase Lyrica to 50mg  BID (two 25mg  capsules BID).  -After 5 days, if no side effects, increase to Lyrica 100mg  BID.  -If side effects noted, patient can take slower approach with 50mg  in the morning and 25mg  at night for a few days before increasing to 100mg  BID.  -Send prescription for Lyrica 100mg  capsules.    Iron Deficiency  NASH  Established, ongoing.  Ferritin levels dropping. Last order was from Paula Daugherty who has left the practice.  -Order ferritin levels to be checked every 2-3 months.  -Send referral for new gastroenterology consult.    Pharmacy Consult  Scheduled for a phone consult on December 26th.  -No action needed at this time.    Neurology Follow-up  Patient recently saw neurologist who recommended continuation of Plavix.  -No action needed at this time.    Husband's health:  Paula Daugherty is improving on prednisone.  -No action needed at this time.       Modified Medications    Modified Medication Previous Medication    PREGABALIN (LYRICA) 100 MG CAPSULE Pregabalin (LYRICA) 100 mg capsule       Take 1 capsule by mouth 2 times daily. Start after ramp up prescription    Take 1 capsule by mouth 2 times daily. Start after ramp up prescription        SUBJECTIVE      Paula Daugherty is a 77yr old  female.    History of Present Illness  Paula Daugherty, a 77 year old female with chronic pain, presents for a follow-up visit. female with chronic pain, presents for a follow-up visit. She has transitioned from gabapentin to Lyrica for pain management and is currently taking 25mg  twice daily without any side effects. She has been on this regimen since Monday.    In addition to her chronic pain, Paula Daugherty has been experiencing swelling and weight gain, which she attributes to her medication. She also has a history of stroke and is on lifelong Plavix therapy. She has been experiencing some concern about her ferritin levels, which have been dropping significantly over the past few months.    She is also seeking a new gastroenterologist following the departure of her previous provider, Paula Daugherty. She has a standing order for ferritin testing from Paula Daugherty, but is unsure if this will continue now that he has left the practice.       OBJECTIVE      Her vitals were not taken for this visit.     Physical Exam         Gen: appears well, NAD  Chest: non labored breathing, speaking in full sentences   Psych: normal mood and affect     Results  LABS  Ferritin: decreased by 9 ng/mL over 2-3 months     Total time I spent in care of  this patient today (excluding time spent on other billable services) was 20 minutes.     I obtained verbal consent from the patient to use AI ambient technology to transcribe the interactions between the patient and myself during the clinical encounter.    Today's visit has complexity inherent to evaluation and management associated with medical care services that serve as the continuing focal point for all needed health care services.    Electronically signed by:  Paula Portela, MD  Associate Physician  [facility] [place] [facility]

## 2023-04-06 ENCOUNTER — Encounter: Payer: Self-pay | Admitting: Optometrist

## 2023-04-06 ENCOUNTER — Encounter: Payer: Self-pay | Admitting: Student in an Organized Health Care Education/Training Program

## 2023-04-06 ENCOUNTER — Ambulatory Visit: Payer: Medicare Other | Attending: OPHTHALMOLOGY | Admitting: OPHTHALMOLOGY

## 2023-04-06 ENCOUNTER — Ambulatory Visit: Payer: Vision Other Private Insurance | Admitting: Optometrist

## 2023-04-06 DIAGNOSIS — E119 Type 2 diabetes mellitus without complications: Secondary | ICD-10-CM

## 2023-04-06 DIAGNOSIS — H33311 Horseshoe tear of retina without detachment, right eye: Secondary | ICD-10-CM | POA: Insufficient documentation

## 2023-04-06 DIAGNOSIS — H524 Presbyopia: Secondary | ICD-10-CM

## 2023-04-06 DIAGNOSIS — H5200 Hypermetropia, unspecified eye: Secondary | ICD-10-CM

## 2023-04-06 DIAGNOSIS — H33031 Retinal detachment with giant retinal tear, right eye: Secondary | ICD-10-CM

## 2023-04-06 DIAGNOSIS — Z7984 Long term (current) use of oral hypoglycemic drugs: Secondary | ICD-10-CM | POA: Insufficient documentation

## 2023-04-06 DIAGNOSIS — M5116 Intervertebral disc disorders with radiculopathy, lumbar region: Secondary | ICD-10-CM

## 2023-04-06 DIAGNOSIS — H33301 Unspecified retinal break, right eye: Secondary | ICD-10-CM | POA: Insufficient documentation

## 2023-04-06 NOTE — Progress Notes (Unsigned)
HPI 04/06/2023:    Paula Daugherty is a pleasant 77yr old female here for first visit with me for retinal tear inferior OD referred by Dr. Ronnie Doss.    Pt reports vision is blurry in the morning. Denies eye pain, flashes or floaters. Using eye drops for dryness at least once a week.       Past Medical History[1]    ROS:  Gen: No acute distress    Base Eye Exam       Visual Acuity (Snellen - Linear)         Right Left    Dist cc 20/25 -2 20/30 -1   Copied from [place] visit from today.             Tonometry ([facility], 1:13 PM)         Right Left    Pressure 18 12   Copied from [place] visit from today.             Pupils         Dark Shape React    Right 3 Round +    Left 3 Round +   Copied from [facility] visit from today.             Visual Fields (Counting fingers)         Right Left     Full Full   Copied from [place] visit from today.             Extraocular Movement         Right Left     Full Full   Copied from [facility] visit from today.             Neuro/Psych       Oriented x3: Yes    Mood/Affect: Normal   Copied from [facility] visit from today.             Dilation       Both eyes: 10% Phenylephrine @ 1:33 PM                  Slit Lamp and Fundus Exam       External Exam         Right Left    External Normal Normal              Slit Lamp Exam         Right Left    Lids/Lashes Normal Normal    Conjunctiva/Sclera White and quiet White and quiet    Cornea Clear Clear    Anterior Chamber Deep and quiet Deep and quiet    [employer] dilated Round and reactive    Lens Posterior chamber intraocular lens Posterior chamber intraocular lens    Anterior Vitreous Posterior vitreous detachment Normal              Fundus Exam         Right Left    Disc Normal Undilated view, normal    C/D Ratio 0.25     Macula Normal, no DME     Vessels Normal, no hemes or mas     Periphery Retinal tear inferior with trace pigment surround                     Data Reviewed:  OCT macula 04/06/2023:  OD: Normal foveal contour, no fluid    Optos  04/06/2023  OD: Inferior retinal tear with no obvious surrounding SRF  Date    Time since last      Status           Med    Lab Results   Lab Name Value Date/Time    HGBA1C 6.8 (H) 03/11/2023 10:26 AM    HGBA1C 6.4 (H) 09/09/2022 08:25 AM    HGBA1C 5.8 (H) 06/09/2022 08:12 AM    HGBA1C 6.6 (H) 08/30/2015 08:35 AM    HGBA1C 7.7 (H) 03/23/2015 09:04 AM    HGBA1C 6.9 (H) 12/06/2014 11:45 AM       ASSESSMENT:  Asymptomatic retinal tear inferior OD  - Started off with being retinal hole 1 year ago noted by Dr. 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 No need for laser retinopexy for the tear in the right eye at this moment.   - Dilating drops placed in left eye for DFE left eye   - Patient encouraged to contact clinic immediately if any new or worsening symptoms, such as worsening vision, ocular pain, new flashes or floaters, or curtain noted.  - Discussed having a OCT and Optos for the retinal hole for closer examination of the tear in the right eye    Follow up in 3 months for Optos OU and DFE OU    Note: She is retired and from [employer]; she prefers [employer] compared to [place]    I confirm that I reviewed the patient's medical history and other pertinent data, and personally examined the patient with the ophthalmic exam findings documented here.     I agree with the exam findings, treatment plan, and medical decision making reported by the resident or fellow, with any exceptions as noted above.      SCRIBE DISCLAIMER:   This note was scribed by Rodena Piety, SCRIBE, a trained medical scribe in the presence of Dr. Ramond Craver, M.D.  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

## 2023-04-06 NOTE — Progress Notes (Unsigned)
HPI 04/06/2023:    Paula Daugherty is a pleasant 77yr old female here for first visit with me for retinal tear inferior OD referred by Dr. Ronnie Doss.    Pt reports vision is blurry in the morning. Denies eye pain, flashes or floaters. Using eye drops for dryness at least once a week.       Past Medical History[1]    ROS:  Gen: No acute distress    Base Eye Exam       Visual Acuity (Snellen - Linear)         Right Left    Dist cc 20/25 -2 20/30 -1   Copied from [place] visit from today.             Tonometry ([facility], 1:13 PM)         Right Left    Pressure 18 12   Copied from [place] visit from today.             Pupils         Dark Shape React    Right 3 Round +    Left 3 Round +   Copied from [facility] visit from today.             Visual Fields (Counting fingers)         Right Left     Full Full   Copied from [place] visit from today.             Extraocular Movement         Right Left     Full Full   Copied from [facility] visit from today.             Neuro/Psych       Oriented x3: Yes    Mood/Affect: Normal   Copied from [facility] visit from today.             Dilation       Both eyes: 10% Phenylephrine @ 1:33 PM                  Slit Lamp and Fundus Exam       External Exam         Right Left    External Normal Normal              Slit Lamp Exam         Right Left    Lids/Lashes Normal Normal    Conjunctiva/Sclera White and quiet White and quiet    Cornea Clear Clear    Anterior Chamber Deep and quiet Deep and quiet    [employer] dilated Round and reactive    Lens Posterior chamber intraocular lens Posterior chamber intraocular lens    Vitreous Posterior vitreous detachment Normal              Fundus Exam         Right Left    Disc Normal Undilated view, normal    C/D Ratio 0.25     Macula Normal, no DME     Vessels Normal, no hemes or mas     Periphery Retinal tear inferior with trace pigment surround                     Data Reviewed:  OCT macula 04/06/2023:  OD: Normal foveal contour, no fluid    Optos  04/06/2023  OD: Inferior retinal tear with no obvious surrounding SRF  Date    Time since last      Status           Med    Lab Results   Lab Name Value Date/Time    HGBA1C 6.8 (H) 03/11/2023 10:26 AM    HGBA1C 6.4 (H) 09/09/2022 08:25 AM    HGBA1C 5.8 (H) 06/09/2022 08:12 AM    HGBA1C 6.6 (H) 08/30/2015 08:35 AM    HGBA1C 7.7 (H) 03/23/2015 09:04 AM    HGBA1C 6.9 (H) 12/06/2014 11:45 AM       ASSESSMENT:  Asymptomatic retinal tear inferior OD  - Started off with being retinal hole 1 year ago noted by Dr. 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

## 2023-04-06 NOTE — Telephone Encounter (Signed)
General Advice / Message to MD:    Patient returned call and states she does not understand the message. Please return her call.         Paula Daugherty  Patient Contact Center  [facility]

## 2023-04-06 NOTE — Progress Notes (Signed)
If you are reviewing this progress note and have questions on its meaning or medical terms, please discuss at our clinic visit. The purpose of a chart note is to convey the pertinent medical information as concisely and thoroughly as possible for our medical colleagues and thus there may be esoteric medical terms and acronyms.     Chief Complaint   Patient presents with    Eye Exam       Lab Results   Lab Name Value Date/Time    HGBA1C 6.8 (H) 03/11/2023 10:26 AM    HGBA1C 6.4 (H) 09/09/2022 08:25 AM    HGBA1C 5.8 (H) 06/09/2022 08:12 AM    HGBA1C 6.6 (H) 08/30/2015 08:35 AM    HGBA1C 7.7 (H) 03/23/2015 09:04 AM    HGBA1C 6.9 (H) 12/06/2014 11:45 AM         Pertinent Personal Medical History:  See EMR-No changes  DM    Past Ocular History:   Retinal hole OD  CE OU     Ocular Medications:  none      Assessment/Plan:    Low Hyperopic presbyope. Stable prescription. Released a glasses prescription to the patient.  Asymptomatic Retinal tear inferior OD. Will refer to retina service for evaluation  Non - insulin dependent diabetes without retinopathy OU.  No evidence of diabetic retinopathy nor clinically significant macular edema was present in either eye.  I emphasized the long-term risk to vision from diabetes and the importance of strict glycemic control, blood pressure control, along with regular eye exams to detect diabetic retinopathy at an early stage to prevent vision loss. She should return in one year for a dilated eye examination, or sooner if any changes are noticed.        Maple Mirza VanBuskirk, O.D, F.A.A.O.  Firefighter

## 2023-04-06 NOTE — Progress Notes (Deleted)
HPI 04/06/2023:    Driana Hughett is a pleasant 77yr old female here for first visit with me for retinal tear inferior OD referred by Dr. Ronnie Doss.    Past Medical History[1]    ROS:  Gen: No acute distress    Base Eye Exam       Visual Acuity (Snellen - Linear)         Right Left    Dist cc 20/25 -2 20/30 -1   Copied from [place] visit from today.             Tonometry ([facility], 1:13 PM)         Right Left    Pressure 18 12   Copied from [place] visit from today.             Pupils         Dark Shape React    Right 3 Round +    Left 3 Round +   Copied from [facility] visit from today.             Visual Fields (Counting fingers)         Right Left     Full Full   Copied from [place] visit from today.             Extraocular Movement         Right Left     Full Full   Copied from [facility] visit from today.             Neuro/Psych       Oriented x3: Yes    Mood/Affect: Normal   Copied from [facility] visit from today.                   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

## 2023-04-06 NOTE — Telephone Encounter (Signed)
Forwarding to Clinical staff to address

## 2023-04-07 MED ORDER — PREGABALIN 50 MG CAPSULE
50.0000 mg | ORAL_CAPSULE | Freq: Two times a day (BID) | ORAL | 2 refills | Status: DC
Start: 2023-04-07 — End: 2023-04-10

## 2023-04-07 NOTE — Telephone Encounter (Signed)
General Advice / Message to MD:    Pt states she is taking 50 mg in the morning and 50 mg at night of Lyrica. Please advise.     Annamarie Major  PSR II

## 2023-04-07 NOTE — Telephone Encounter (Signed)
Attempted to call pt as well. No answer VM left. Also responded via MyChart message.

## 2023-04-07 NOTE — Addendum Note (Signed)
Addended by: Donell Sievert on: 04/07/2023 09:11 AM     Modules accepted: Orders

## 2023-04-08 ENCOUNTER — Ambulatory Visit: Payer: Medicare Other | Admitting: PHYSICAL THERAPIST

## 2023-04-08 DIAGNOSIS — M5116 Intervertebral disc disorders with radiculopathy, lumbar region: Secondary | ICD-10-CM

## 2023-04-08 NOTE — Progress Notes (Unsigned)
PHYSICAL THERAPY EVALUATION 04/08/2023     Diagnosis:  Therapy diagnosis: Acute Lumbar disc disease with radiculopathy   Medical diagnosis: M51.16 (ICD-10-CM) - Lumbar disc disease with radiculopathy     Authorizing MD (First and last name) and PI#: [id number]) Linna Caprice, NP   Onset Date: 2 mo      Start of Care Date: 04/08/2023   Prior Level of function: independent-able to walk during cruise  Prior treatment for this diagnosis: no  Rehab potential: Fair    Precautions: none    Certification period end date (for Medicare patients): 07/07/2023  Estimated Discharge Date: 07/07/23    Plan of care:  Established 04/08/2023  1 times every 1-4 wks for a total of 6-12 treatments. Patient has 12 visits currently authorized. Authorization/Referral # [id number] . Expires 03/12/24. Additional authorization may be necessary.      To include   Therapeutic Procedure  Neuromuscular Re-education  Therapeutic Activity  Traction  Deep Soft Tissue Massage  Joint Mobilization  Modalities: hot packs and cold packs  Home Exercise Program  Patient/Caregiver Education    Goals:   Patient will be able to sit 30-45 minutes without increased symptoms  Patient will be able to be on feet 15-20 minutes without increased symptoms  Patient will be able to walk 15-20 minutes without increased symptoms  Patient will be able to sit and reach floor to tie shoes  Patient will be able to sleep 4-6 hours without increased symptoms  Patient will be independent with home exercise program and home recommendations      Fall risks Assessment:   No falls in past year. No throw rugs, poorly lighted areas, missing rails/grab bars in the home. No medications or conditions that affect stability or mobility.      Certification period end date (for Medicare patients): 07/07/2023  Estimated Discharge Date: 07/07/23      Total Evaluation time: 45  Treatment time in addition to evaluation: 15         Pre pain: 7/10  Post pain:  7/10    Clinical Evaluation   Progress Notes  03/13/23  Linna Caprice, NP (PA/NP)  NURSE PRACTITIONER       Chief Complaint   Patient presents with    Back Pain       Sciatic pain follow up       Paula Daugherty 59yr female presenting for follow-up on her left-sided sciatica pain  - Continues to have lower back pain with radiation down the lower back to the left leg  - Using gabapentin as directed, 300mg  BID; not assisting with pain control well  - Using Tylenol/Motrin regularly as well, but trying to limit her Motrin given her kidney function  - Has been walking a lot from a recent trip to [place] with multiple cruises, feels like pain never improved during this time    - Required a wheelchair to get off the cruise ship due to her pain  - Has been using muscle massage and heat on the area, but hasn't helped  ASSESSMENT & PLAN        1. Lumbar disc disease with radiculopathy  - Chronic condition, not well controlled at this time  - Reviewed CT L-spine with patient today  - Spine Program referral requested for consult, possible recommendations on injections vs surgery given CT findings  - PT referral to reduce pain and improve function; counseled on impact of PT on back pain, possible lack of response given discogenic  source of pain  - Gabapentin adjusted: 300mg  QAM, 600mg  QHS, possible 300mg  dose in mid-day if needed  - Continue Tylenol 1000mg  TID PRN; cautioned on regular use ibuprofen    - Advised if breakthrough pain control needed, 400-600mg  once per day only   - Follow-up after evaluation with specialists as indicated OR based on pain level/response to medications may require sooner evaluation  - Spine Program Referral  - Physical Therapy Referral  - Gabapentin (NEURONTIN) 300 mg Capsule; Take 1 capsule by mouth every morning AND 2 capsules every day at bedtime. If needed, OK to take 1 capsule by mouth at mid-day for pain control.  Dispense: 180 capsule; Refill: 1        CT L-SPINE WITHOUT CONTRAST  EXAM DATE: 02/07/2023 10:20 AM  COMPARISON: Plain  film September 2024, CT abdomen August 2022  FINDINGS:     Alignment: Straightened normal lumbar lordosis.     Vertebrae: No acute fractures or destructive changes. There is no  significant spinal canal stenosis.  At L2-3: Disc bulge and mild facet arthropathy with no significant spinal  canal or foraminal narrowing.  At L3-4 mild disc bulge, thickening of ligamentum flavum and facet  arthropathy. No significant spinal canal or significant foraminal stenosis.  At L4-L5, moderate disc bulge slightly asymmetric to left and suspected  small left subarticular protrusion and thickening of ligamenta flava with  mild to moderate spinal canal narrowing. Facet arthropathy with mild  inferior foraminal narrowing but no CT evidence of nerve root impingement.  At L5-S1, left asymmetric disc bulge extending to neural foramen, facet  arthropathy and thickening of ligamenta flava. Osteophyte and intervening  disc contributing left mild foraminal narrowing. Disc bulge and facet  arthropathy with right foraminal narrowing. No significant spinal canal  narrowing  Prevertebral and paraspinal soft tissues: Normal.  Atherosclerotic calcifications of aorta and iliac vessels.  IMPRESSION:  1. No acute fracture or subluxation of the lumbar spine.  2. Degenerative changes of lower lumbar spine most pronounced at L4-L5  with suspected left subarticular protrusion on bulge contributing  multifactorial moderate spinal canal narrowing.  3. L5-S1 left greater than right mild foraminal narrowing         SUBJECTIVE  History of current problem:                                   Paula Daugherty is a 77yr old female referred to physical therapy due to left hip and leg pain for 2 mo gradually in left hip.  Worse since.  Saw [facility] and referred to Pain Management tomorrow.     History of recent TIA without upper extremity(ies) or lower extremity(ies) weakness 10/2022      C/c-constant left hip and left lower extremity sharp dull   Constant  Numbness/tingling lateral lower leg and top of ankle    Aggravating factors include: walking 1 min, standing 1 minutes, sitting 5 minutes, wakes several time/night.   Easing Factors include: tylenol    Pain ranges from   5/10 at best   9/10 at worst     Limitations in activities of daily living include:   Sitting   Standing   Walking     Medical History/Co-morbidities effecting the plan of care: none  Electronic Medical Record Reviewed: Including radiologist reports of associated images and scans      Past Medical History:  09/23/2017: Angina pectoris (HCC)  No date:  Anxiety  No date: Asthma  No date: Cirrhosis (HCC)  No date: Constipation, chronic  No date: Depression  No date: Diabetes mellitus (HCC)  No date: Fatty liver  No date: Gastroparesis  No date: History of rectal bleeding  No date: Hypertension  No date: Iron deficiency anemia  2015: Kidney disease  No date: Liver fibrosis  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   Social History     Social History Narrative    Not on file       OBJECTIVE EXAM:  Appearance:   healthy, alert, moderate distress, and cooperative    Inspection/Palpation:   spinal tenderness noted left lowback paraspinals tender to palpation   Muscle tone: limited range of motion with pain  Bed mobility and transfers quarded    Gait:    Assistive device used: no assistive device  Gait assessment (description of gait pattern): slow speed, decreased B step length    Range of motion:     Lumbar Spine     Normal  Other measurement techniques    Flexion 25%* 110      inches above ankle (with longest finger)   Extension  10* 30         Left Right Normal  Other measurement techniques    Side  Bending  3 fingers above knee* 2 fingers  30  inches above knee joint line (with longest finger)   Rotation    20                             Hips and lower extremities general flexibility: not assessed    Other range of motion tested: No other range of motion done at this time    Strength: MMT out of 5   Left  Right    Hip Flexion  (L2) 5 5   Knee Extension (L3) 5 5   Ankle Dorsiflexion (L4) 5 5   Long Toe Extensors (L5) 3 5   Ankle Plantar Flexors (S1) 5 5          General Core strength: poor    Other Strength tested: bilateral lower extremity(ies) within functional limits                 Segmental Testing: (note abnormality)   Lumbar spine: Not tested at this time    Lower thoracic spine:Not tested at this time    Neurological screens:    normal DTRs,  and reflexes  muscle strength decreased great toe extension L5    Special Tests:   Left  Right  Reason for test Position of test   Lumbar facet Grind  Not tested Not tested Lumbar facet dysfunction on ipsilateral side with pain at maximal rotation/extension Extend to 30 degrees. Rotate with downward pressure on shoulders   Straight Leg Raise Not tested Not tested Provocation test for sciatic nerve irritation (positive at 30-60 degrees)   Supine raise one leg up keeping it straight from 0-70 degrees.  (can be sensitized)      Yeoman's Test: Not tested Not tested Provocation test for SI joint dysfunction.    Prone: stabilize contralateral hip and pull  upward on anterior thigh   Patrick's Test  (FABER) Not tested Not tested Provocation test for ipsilateral hip dysfunction, or contralateral SI joint dysfunction   Supine: Stabilize contralateral pelvis.  Flex, abduct and ER hip. (figure 4 position)     Femoral Nerve Stretch Not tested Not tested Femoral Nerve Irritation/ Lumbar radiculopathy Prone with knee flexed: Elevate (extend) hip with knee still in flexion.  (modified in sitting if needed)     Other Special Tests performed: gentle traction sidelying and supine  increased pain     Treatment today in addition to evaluation(if applicable):    Therapeutic exercise(s) home exercise program x 15 minutes    Access Code: [email or web address]  Exercises  - Hooklying Single Knee to Chest  - 2 x daily - 7 x weekly - 2 reps - 20 sec hold  - Supine Lower Trunk Rotation  - 2 x daily - 7 x weekly - 1-3 sets - 10 reps - 3-5 sec hold  - Ice  - 2-3 x daily - 7 x weekly - 15 min hold  - Heat  - 2-3 x daily - 7 x weekly - 15 min hold    Therapeutic activities  Patient was educated in the anatomy and physiology involved.  Common symptoms, treatments, responses to treatment, and self management were discussed with the patient.       ASSESSMENT: The patient presents with pain and decreased function due to Daugherty back dysfunction.   Signs and symptoms are consistent with inflammatory stage Daugherty back pain, radiculopathy and radicular symptom(s) in left lower extremity(ies) creating L5 myotome weakness.  Patient is currently in inflammatory stage with high irritability and severity. Patient is scheduled to have appointment(s) with Pain Management this week for possible spine injection     Patient made informed consent for today's treatment. The procedures, treatment, and plan of care were discussed, their questions were answered, and the patient contributed and agreed with the process.  The patient would benefit from skilled physical therapy to improve function with: walking, standing, bed mobility, transfers, sleep, sitting.     Clinical Presentation: Unstable/ unpredictable characteristics    The components of this evaluation necessitated a High complexity level of clinical decision making.      Plan for next visit: TENS, Daugherty core exercise(s), traction.     (see plan of care at top of note for general plan details)    In the event of unattended discharge, this note will serve as discharge document        Patient Education:      Method of  Teaching: demonstration, verbal  and written hand-out     Learner: patient     Response: verbalizes understanding and able to give return demonstration       Has the plan of care been explained to the patient/caregiver? yes       Is the patient able to understand the plan of care: yes       Does the patient/caregiver(s) agree with the plan of care: yes       Are there barriers to learning? no.         Motivated to learn? yes       Best learning method: verbal, or demo      Patient/Family participation and agreement with recommendations: verbalized agreement    Is patient currently receiving outside care for this problem:     Home Health care services: no  Skilled nursing facility: no    Other no  Atilano In[place], [place]  DG#[id number]           [1]   Past Surgical History:  Procedure Laterality Date    BIOFEEDBACK PERI/URO/RECTAL  10/21/2017    session #1: N Score = 5.4    BIOFEEDBACK PERI/URO/RECTAL  11/04/2017    session #2    BIOFEEDBACK PERI/URO/RECTAL  11/18/2017    Session #3    BIOFEEDBACK PERI/URO/RECTAL  12/02/2017    Session #4    BIOFEEDBACK PERI/URO/RECTAL  12/30/2017    Session #5    BIOFEEDBACK PERI/URO/RECTAL  03/23/2018    Session #6    CAPSULE ENDOSCOPY  01/27/2018    CAPSULE ENDOSCOPY  01/29/2018         CAPSULE ENDOSCOPY  12/23/2021    wnl    COLONOSCOPY  01/04/2014    polyp--TA and erosion; hemorrhoids; tics; repeat x 3 yrs    COLONOSCOPY  12/16/2017    TA; repeat in 3 yrs    COLONOSCOPY  04/05/2021    polyps--path pending; hemorrhoids    EGD  04/2020    gastritis    EGD  04/08/2019    biopsy path--pending    EGD  04/05/2021    biopsies at tattoo--path pending    [facility] BIOFEEDBACK PERI/URO/RECTAL  05/02/2018         [facility] COLONOSCOPY,REMV LESN,SNARE  12/16/2017    MAMMOPLASTY, REDUCTION  1985    MANOMETRY  09/02/2017    work on constipation; kegels and biofeedback; proceed w/colonoscopy     PHACOEMULSIFICATION, CATARACT Right 02/12/2015     with IOL    PR ESOPHAGOGASTRODUODENOSCOPY TRANSORAL DIAGNOSTIC  02/12/2016    EGD--polyp--path pending; antral gastritis; no varices     PR ESOPHAGOGASTRODUODENOSCOPY TRANSORAL DIAGNOSTIC  06/11/2016    EGD--biopsy at site of carcinoid tumor path pending     PR ESOPHAGOGASTRODUODENOSCOPY TRANSORAL DIAGNOSTIC      EGD--folds in duodenum--path pending     PR ESOPHAGOGASTRODUODENOSCOPY TRANSORAL DIAGNOSTIC  04/2017    EGD; repeat EGD in 1 year     PR ESOPHAGOGASTRODUODENOSCOPY TRANSORAL DIAGNOSTIC  04/20/2018    gastritis; nodule--path pending    PR KNEE SCOPE,DIAGNOSTIC  1980s    Knee arthroscopy    PR LIGATE FALLOPIAN TUBE      Tubal ligation    PR REPAIR TYMPANIC MEMBRANE      Tympanoplasty    PR TOTAL ABDOM HYSTERECTOMY  1980s    partial; no BSO    REPAIR, BLEPHAROPTOSIS Bilateral 08/06/2015    Blepharoplasty bilateral upper lids

## 2023-04-09 NOTE — Progress Notes (Addendum)
[facility]  Department of Anesthesiology and Pain Medicine  [address], [address]  [place], [place] [address]  Phone: [phone]    Fax: [phone]    Date: 04/10/2023    Re: Paula Daugherty  MR#: [id number]  Date of Birth: 1945/10/09  Age: 6yr    Primary care provider: Garth Bigness, MD  Requesting physician: Levada Dy, PA    Below is the summary of the visit with our mutual patient, Paula Daugherty. Thank you for your cooperation in her care.         ASSESSMENT         This is a patient with low back pain and left-sided L5 lumbar radiculopathy referred for consideration of injection.       ICD-10-CM    1. Lumbar disc disease with radiculopathy  M51.16 Pregabalin (LYRICA) 100 mg capsule     MethylPREDNIsolone (MEDROL DOSPACK) 4 mg tablet     EPIDURAL STEROID INJECTIONS                Orders Placed This Encounter    DISCONTD: Pregabalin (LYRICA) 100 mg capsule    MethylPREDNIsolone (MEDROL DOSPACK) 4 mg tablet    Pregabalin (LYRICA) 100 mg capsule                   RECOMMENDATIONS/TREATMENT PLAN     Imaging:  None at this time. 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     Interventional Therapy:  Will Plan: Left L5-S1 epidural steroid injection  *patient has allergy to contrast    Medications:  - Start Medrol Dospack  - Continue Lyrica, increase to 100mg  twice daily. Creatinine clearance = 46.    Physical Rehabilitation:  Completed 6 weeks of physical therapy and enrolled in HEP (at [facility])     Psychiatric:  No referral to pain psychology at this time       DISPOSITION AFTER TODAY'S VISIT:     PRIORITY SCHEDULING: next available  ATTENDING: Dr. Lulu Riding  PROCEDURE: Left L-5 transforaminal epidural steroid injection(s)  IMAGING: fluoroscopy  STEROID: yes  SPECIAL INSTRUCTIONS: no special instructions  CONSENT SIGNED TODAY: Yes             History of Present Illness (HPI)     Ms. Dsouza is a 77yr -old female with a chief  complaint of low back pain that radiates to the left lower extremity. The pain goes into her left thigh, into her calf, and into her foot. Referred by ortho spine for consideration of ESI vs TFESI. Patient was accompanied by her husband who had an injection in the spine in the past. PMHx: Fibromyalgia. Allergy to contrast dye. Unable to get MRI given stapedectomies. Patient hesitant about injection as she has a daughter who is paralyzed and is worried - discussed we can trial lyrica and medrol dosepak first while getting the ball rolling for an injection.     Current Description of Symptoms:  Onset: between 3 and 6 months ago.  Timing: Constant   Severity:  8 out of 10  Quality:  cramping,pins and needles,sharp,numbness,cutting,pressure   Interferes with: daily life, chores, sleep   Modifying factors: Better with  nothing. Worse with  lying down,standing,sitting,walking.   The patient denies cauda equina and red flag symptoms    Current and prior pain medication regimen:   [x]  Opioids: Tramadol  [x]  Anticonvulsants: Gabapentin, Lyrica 50 mg twice daily   []  TCA/ SNRI: none   []  Muscle  relaxants: none   [x]  NSAIDs: as needed  [x]  Acetaminophen: as needed  []  Benzodiazepines: none  []  Topicals: none   []  Other: none     Conservative therapies:   [x]  Physical Therapy (completed)  []  Physician Directed Home Exercise Plan   []  Acupuncture   []  Chiropractic Services   [x]  Heat/ice   []  TENS unit     [x]  Massage   []  Cognitive Behavioral Therapy   Exercise/physical activity the patient currently engages in: none    Pain Treatment History:  Is there ongoing legal action related to the pain problem?  No  Diabetic: A1C 6.8 03/11/2023  Creatinine/GFR: 1.22/46 on 03/11/2023  NSAIDs: yes  Anticoagulation: Current Anticoagulation Medications/Supplements  Clopidogrel (PLAVIX) 75 mg Tablet         Prior interventions:   Has had a knee injection in the past.    Pain Location Diagram        PSYCHOLOGICAL HISTORY AND TREATMENTS, PAIN  EFFECTS ON MOOD:   Social History[1]    From a mood standpoint, Ms. Enloe responses to the items below which she has experienced within the past 1 year have the potential to make pain symptoms worse.     [x]  Stress (eg, family, work, Actuary, Catering manager.)  [x]  Frustration   [x]  Sleep disturbance  [x]  Increased irritability   [x]  Difficulty engaging in activities (eg, work, hobbies, chores, etc.)  [x]  Changes in mood  []  NONE OF THE ABOVE      Ms. Hanford has not been evaluated or treated by a psychiatrist, psychologist, or counselor.       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 Duffell's employment status (see below) has not been affected by the present pain condition.    She is not currently unemployed due to the painful condition.    Objective       ALLERGIES:  Allergies[2]    CURRENT MEDICATIONS:   Medications Taking[3]    HISTORIES:   Past Medical History[4]    Past Surgical History[5]    Family History[6]      PERTINENT LAB RESULTS  Labs: The lab/study results below were reviewed.    Lab Results   Component Value Date    HGBA1C 6.8 (H) 03/11/2023    A1CPOC 6.8 (Abnl) 11/01/2019    BUN 22 (H) 03/11/2023    CR 1.22 (H) 03/11/2023    WBC 6.6 03/11/2023    PLT 198 03/11/2023    INR 1.11 12/19/2022    NA 141 03/11/2023    K 4.3 03/11/2023    GLU 132 (H) 03/11/2023    AST 21 12/19/2022    ALT 18 12/19/2022     DIAGNOSTIC STUDIES AND MEDICAL REVIEW:    We reviewed and interpreted relevant radiology reports and reviewed relevant records from other services in the EMR.    01/23/2023 XRAY L-Spine:  FINDINGS:  Alignment: Minimal levocurvature which is likely positional.     Vertebrae: No fractures or destructive changes. Decreased bone density.  Multilevel intervertebral disc space narrowing and endplate degenerative  changes. Lower lumbar facet arthropathy.     Prevertebral and paraspinal soft tissues: Calcified aorta.     IMPRESSION:  1. Lumbar disc degeneration  and facet joint arthropathy, more  pronounced in the lower lumbar spine.    CT L-Spine 02/07/2023:  FINDINGS:  Alignment: Straightened normal lumbar lordosis.     Vertebrae: No acute fractures or destructive changes. There is no  significant spinal canal stenosis.  At L2-3: Disc bulge and mild facet arthropathy with no significant spinal  canal or foraminal narrowing.  At L3-4 mild disc bulge, thickening of ligamentum flavum and facet  arthropathy. No significant spinal canal or significant foraminal stenosis.  At L4-L5, moderate disc bulge slightly asymmetric to left and suspected  small left subarticular protrusion and thickening of ligamenta flava with  mild to moderate spinal canal narrowing. Facet arthropathy with mild  inferior foraminal narrowing but no CT evidence of nerve root impingement.  At L5-S1, left asymmetric disc bulge extending to neural foramen, facet  arthropathy and thickening of ligamenta flava. Osteophyte and intervening  disc contributing left mild foraminal narrowing. Disc bulge and facet  arthropathy with right foraminal narrowing. No significant spinal canal  narrowing  Prevertebral and paraspinal soft tissues: Normal.  Atherosclerotic calcifications of aorta and iliac vessels.     IMPRESSION:  1. No acute fracture or subluxation of the lumbar spine.  2. Degenerative changes of lower lumbar spine most pronounced at L4-L5  with suspected left subarticular protrusion on bulge contributing  multifactorial moderate spinal canal narrowing.  3. L5-S1 left greater than right mild foraminal narrowing    PHYSICAL EXAM     Vitals:    04/10/23 1334 04/10/23 1336   BP: (!) 150/74 (!) 141/71   SITE: left arm left arm   Orthostatic Position: sitting sitting   Cuff Size: regular regular   Pulse: 64 64   Temp: 36.3 C (97.4 F)    TempSrc: Temporal      There is no height or weight on file to calculate BMI.     General: healthy, alert, no acute distress  CV: extremities well perfused  Respiratory:  breathing comfortably on room air    MUSCULOSKELETAL:   Strength 5/5 throughout bilateral LE. Gait is non-antalgic.  Sensation intact in bilateral LE  Reflexes symmetric in bilateral LE     Lumbar spine: lumbar spine ROM was full and pain-free. Facet load challenge nonprovocative, seated slump provocative on left, SLR provocative on left, FABER nonprovocative, no TTP over lumbar paraspinals bilaterally, no TTP on PSIS bilaterally    MEDICAL DECISION MAKING    Records Reviewed:   Lab/study reports reviewed above were important and necessary because subsequent medical and treatment recommendations required review of the above lab/study reports  Images viewed/reviewed above were important and necessary because subsequent medical and treatment recommendations required review of the above image(s)  The reports by providers noted in the subjective portion of the history were reviewed. This was important and useful because the reviewed note clarified the reasons for the recommended treatment and the patient's history was corroborated in the reviewed note.    Review of the Risk of Comorbidities:  At this juncture, we believe that the patient's status adds moderate risk and complexity to our proposed evaluation and treatment.  Other considerations in this patient's management include comorbid conditions listed previously in the note.     ASSESSMENT AND RECOMMENDATIONS/TREATMENT PLAN:   Please see the beginning of the note.         SCRIBE STATEMENT  I, Max Kuzmenko, am personally taking down the notes in the presence of Dr. Candace Gallus, MD.  Electronically signed by Tami Ribas, Scribe  04/10/2023  2:19 PM       PROVIDER DISCLAIMER:   - I saw and evaluated the patient myself.  - I personally performed the services described in this documentation as scribed by the scribe (who  is named above) in my presence, and it is both accurate and complete.   - Total time I spent in care of this patient today (including face-to-face time  with the patient, review of the medical records today both before and after the visit, charting; but excluding time spent on other billable services) was 30 minutes. This is non-overlapping time from any other health care professional time statement that may be included on this date of service.  - The patient was instructed and educated on all aspects of the plan of care.  The patient acknowledged the plan of care.    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    Tobacco Use    Smoking status: Former     Current packs/day: 0.10     Average packs/day: 0.1 packs/day for 20.0 years (2.0 ttl pk-yrs)     Types: Cigarettes     Passive exposure: Never    Smokeless tobacco: Never    Tobacco comments:     quit 30 years ago   Vaping Use    Vaping status: Never Used   Substance and Sexual Activity    Alcohol use: Yes     Alcohol/week: 0.0 standard drinks of alcohol     Comment: rare    Drug use: No    Sexual activity: Yes     Partners: Male   [2]   Allergies  Allergen Reactions    Sulfa (Sulfonamide Antibiotics) Rash    Contrast Dye [Radiopaque Agent] Hives     Reports has recently tolerated well    Hydrochlorothiazide Other-Reaction in Comments     Patient reports    Januvia [Sitagliptin] Other-Reaction in Comments     Patient reports    Keflex [Cephalexin] Abdominal Pain    Lyrica [Pregabalin] Other-Reaction in Comments     Patient reports    Omnipaque [Iohexol] Other-Reaction in Comments     Patient reports    Prozac [Fluoxetine Hcl] Other-Reaction in Comments     Patient reports    Topiramate Other-Reaction in Comments     Hairfall   [3]   Outpatient Medications Marked as Taking for the 04/10/23 encounter (Office Visit) with Candace Gallus, MD   Medication Sig Dispense Refill    MethylPREDNIsolone (MEDROL  DOSPACK) 4 mg tablet follow the package instruction 21 tablet 0    Pregabalin (LYRICA) 100 mg capsule Take 1 capsule by mouth 2 times daily. 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   [5]   Past Surgical History:  Procedure Laterality Date    BIOFEEDBACK PERI/URO/RECTAL  10/21/2017    session #1: N Score = 5.4    BIOFEEDBACK PERI/URO/RECTAL  11/04/2017    session #2    BIOFEEDBACK PERI/URO/RECTAL  11/18/2017    Session #3    BIOFEEDBACK PERI/URO/RECTAL  12/02/2017    Session #4    BIOFEEDBACK PERI/URO/RECTAL  12/30/2017    Session #5    BIOFEEDBACK PERI/URO/RECTAL  03/23/2018    Session #6    CAPSULE ENDOSCOPY  01/27/2018    CAPSULE ENDOSCOPY  01/29/2018         CAPSULE ENDOSCOPY  12/23/2021    wnl    COLONOSCOPY  01/04/2014    polyp--TA and erosion; hemorrhoids; tics; repeat x 3 yrs    COLONOSCOPY  12/16/2017    TA; repeat in 3 yrs    COLONOSCOPY  04/05/2021    polyps--path pending; hemorrhoids    EGD  04/2020    gastritis    EGD  04/08/2019    biopsy path--pending    EGD  04/05/2021    biopsies at tattoo--path pending    [facility] BIOFEEDBACK PERI/URO/RECTAL  05/02/2018         [facility] COLONOSCOPY,REMV LESN,SNARE  12/16/2017    MAMMOPLASTY, REDUCTION  1985    MANOMETRY  09/02/2017    work on constipation; kegels and biofeedback; proceed w/colonoscopy     PHACOEMULSIFICATION, CATARACT Right 02/12/2015    with IOL    PR ESOPHAGOGASTRODUODENOSCOPY TRANSORAL DIAGNOSTIC  02/12/2016     EGD--polyp--path pending; antral gastritis; no varices     PR ESOPHAGOGASTRODUODENOSCOPY TRANSORAL DIAGNOSTIC  06/11/2016    EGD--biopsy at site of carcinoid tumor path pending     PR ESOPHAGOGASTRODUODENOSCOPY TRANSORAL DIAGNOSTIC      EGD--folds in duodenum--path pending     PR ESOPHAGOGASTRODUODENOSCOPY TRANSORAL DIAGNOSTIC  04/2017    EGD; repeat EGD in 1 year     PR ESOPHAGOGASTRODUODENOSCOPY TRANSORAL DIAGNOSTIC  04/20/2018    gastritis; nodule--path pending    PR KNEE SCOPE,DIAGNOSTIC  1980s    Knee arthroscopy    PR LIGATE FALLOPIAN TUBE      Tubal ligation    PR REPAIR TYMPANIC MEMBRANE      Tympanoplasty    PR TOTAL ABDOM HYSTERECTOMY  1980s    partial; no BSO    REPAIR, BLEPHAROPTOSIS Bilateral 08/06/2015    Blepharoplasty bilateral upper lids   [6]   Family History  Problem Relation Name Age of Onset    Diabetes Father      Heart Mother          MI at 34s     Non-contributory Brother      Non-contributory Brother

## 2023-04-10 ENCOUNTER — Ambulatory Visit: Payer: Medicare Other | Attending: PAIN MANAGEMENT | Admitting: PAIN MANAGEMENT

## 2023-04-10 ENCOUNTER — Telehealth: Payer: Self-pay | Admitting: Student in an Organized Health Care Education/Training Program

## 2023-04-10 VITALS — BP 141/71 | HR 64 | Temp 97.4°F

## 2023-04-10 DIAGNOSIS — Z79899 Other long term (current) drug therapy: Secondary | ICD-10-CM | POA: Insufficient documentation

## 2023-04-10 DIAGNOSIS — M5116 Intervertebral disc disorders with radiculopathy, lumbar region: Secondary | ICD-10-CM

## 2023-04-10 DIAGNOSIS — Z91041 Radiographic dye allergy status: Secondary | ICD-10-CM | POA: Insufficient documentation

## 2023-04-10 MED ORDER — PREGABALIN 100 MG CAPSULE
100.0000 mg | ORAL_CAPSULE | Freq: Two times a day (BID) | ORAL | 3 refills | Status: DC
Start: 2023-04-10 — End: 2023-05-01

## 2023-04-10 MED ORDER — METHYLPREDNISOLONE 4 MG TABLETS IN A DOSE PACK
ORAL_TABLET | ORAL | 0 refills | Status: DC
Start: 2023-04-10 — End: 2023-05-26

## 2023-04-10 MED ORDER — PREGABALIN 100 MG CAPSULE
100.0000 mg | ORAL_CAPSULE | Freq: Two times a day (BID) | ORAL | 3 refills | Status: DC
Start: 2023-04-10 — End: 2023-04-10

## 2023-04-10 NOTE — Nursing Note (Signed)
Identified patient using name and date of birth.    Vital signs were taken.          Patient's blood pressure was high upon rooming patient, will have patient relax for 5 minutes and recheck blood pressure.    Informed Dr. Lulu Riding via vital sheet highlighting the HIGH BP reading(s) to bring to providers attention.    Patient states  feels fine, and denies chest pain, blurred vision, dizziness associated with hypertension.  Screened for pain.  [name] MA II

## 2023-04-10 NOTE — Telephone Encounter (Signed)
General Advice / Message to MD:    Fabio Bering calling from [facility] pharmacy calling in regards to the Lyrica prescription that was received for 100 mg from Dr. Joya San but was cancelled on 04/07/2023 with pcp npi.    Then on 12/3 received a fax from Dr. Letta Pate was received for Lyrica 50 mg but then was cancelled.    Requesting for a clarification or to resend a prescription. Also to add notes ok to release    [id number] [place] - [address] AT [facility], [phone] [facility] [phone] FX  [address] Evangeline Dakin [place] [address]  Phone: [phone]  Fax: [phone]     Brantley Fling  PSR II, [facility]

## 2023-04-10 NOTE — Addendum Note (Signed)
Addended byCandace Gallus on: 04/10/2023 04:56 PM     Modules accepted: Orders

## 2023-04-13 ENCOUNTER — Ambulatory Visit: Payer: Medicare Other

## 2023-04-13 NOTE — Progress Notes (Signed)
Clinical Pharmacist Chronic Pain and Opioid Stewardship Telephone Visit Note    Phone call to patient at [phone]. Spoke to patient.     Date of Pharmacist Intake:  04/13/23    Assessment/Plan:     Paula Daugherty presents with chronic pain that is potentially neuropathic in nature. Patient is currently titrating their current pain regimen Lyrica 100 mg twice daily. She is opiate naive. The patient endorses pain control is  unmanageable/intolerable and is not meeting functional goals. Current OMED is 0. Patient has the following opioid related risk factors:  concomitant gabapentinoid therapy, age 18 or older, and renal/hepatic insufficiency. The patient may also be experiencing other side effects from current regimen such as  -none per patient  . Last eGFR 03/11/23 46.     Paula Daugherty has uncontrolled nerve pain. She is slowly titrating up Lyrica as she may have had a reaction in the past but is not certain it was from Lyrica. She is working with the pain clinic and trying to avoid any injections. She is interested in buprenorphine. Per [facility] her copay for buprenorphine (Butrans) is $5. She is opiate naive so recommending starting at 5 mcg/hr. She understands how to to use Narcan and will recommend ordering that as well. Her PCP/referring provider is out of the office this week so I will pend orders Monday 04/20/23. Routing note to her inbox for considering by covering providers. She is also taking acetaminophen 6-8 tabs a day but is not certain on the strength. We may need to decrease her dose to prevent over dosing, recommend limiting to 3,000 mg a day. Will verify strength next call.     Recommendations:   - Start buprenorphine (Butrans) 5 mcg/hr patch. Will pend for PCP consideration when she is in the office 12/16.  - Provided education on buprenorphine including indication, mechanism of action, possible side effects, and proper administration. Sent on My Chart as well 04/10/23.   - Limit acetaminophen to max 3,000 mg a  day.   - Extensive opioid education provided with emphasis on patient's specific risks.  - Recommend non pharmacological therapy including: rest, physical therapy/occupational therapy, and mindful pacing of activities.  - MyChart message sent to patient for further education     Risk Reduction Interventions:  - Narcan prescribed, education provided to patient and/or caregiver. Will pend for PCP when pending buprenorphine next week. Not prescribed yet but patient agrees to prescription when starting buprenorphine.   - optimization of non-opioid adjuvant therapy  and reduction of APAP to NTE 3g/day    Follow-up on 04/23/23 via Telephone visit.  Time spent counseling and educating patient: 60 minutes  Note routed to PCP for review.    Hollace Hayward, Pharm. Orson Eva, BCPS  Primary Care Clinical Pharmacist   [facility]  [address]   [place], [place] [address]   [phone]       Subjective/Objective:  Paula Daugherty is a 77yr old female referred by Garth Bigness, MD  for: Consider for buprenorphine? Opioid naive but with chronic pain uncontrolled       Interval History:  04/13/2023:  Paula Daugherty is not taking opioids for chronic pain but her pain is uncontrolled and severely limiting ADLs.  -She is taking methylprednisolone from pain clinic but it is not helping so far.  -She is seeing pain clinic. They are recommending a spinal injections but Paula Daugherty is fearful of this procedure and hopes to avoid any injections.   -Pain from ADLs like  standing in the shower is excruciating.   -She is titrating up Lyrica. She has visual hallucinations on it in the past. She is not sure if Lyrica was the cause of this and it not having problems so far on it. She is increasing slowly.     Current treatment plan:    Medication Patient Reported SIG Pain Result/ Notes   Lyrica 100 mg Twice daily  Just increased to this dose   Acetaminophen (patient unsure of  strength ) 6-8 a day  helps                 Treatment History:  04/13/23: Lyrica 100 mg twice daily, acetaminophen 6-8 tabs a day (unknown strength)     CURES Reviewed: 04/13/23  Last Name First name Date Filled Drug Name Compound Drug Strength Quantity   Paula Daugherty 04/11/2023 PREGABALIN 100 MG 60   Paula Daugherty 03/25/2023 PREGABALIN 25 MG 49   Paula Daugherty 02/02/2023 TRAMADOL HYDROCHLORIDE 50 MG 6   Paula Daugherty 01/27/2023 TRAMADOL HYDROCHLORIDE 50 MG 14   Paula Daugherty 06/18/2022 OXYCODONE HYDROCHLORIDE 5 MG 5       Current PPA: not on opioids   Last PCP visit: 04/01/23  Narcan available to patient: N/A, expired N/A    Pain History 04/13/23:  Patient states reason for referral is discuss pain medications.    Chronic Pain Indication: M51.16 (ICD-10-CM) - Lumbar disc disease with radiculopathy     > Pain:  Reports pain is located in: lower back radiating all the way down her leg.    Patient describes degenerative disc disease with sciatica. Severe pain starting in Daugherty back/hip and extends down to feet and ankles. Shooting tingling burning nerve.     Pain score 7-8/10 on average, 7/10 on good days, 9/10 on bad days, 7 to 6/10 after medication (Lyrica and acetaminophen barely take the edge off and allows minimal ADLs). Patient reports a tolerable pain score is 2-3/10.     Pt notes pain is most intolerable in the all day/constant.    Relieving factors: PT/OT-just started last week, tried exercises at home and pain increased  Exacerbating factors: movement, prolong standing, bending, and walking  Any walking and any standing. She avoids moving.   Functional goals include: ADLs    > Sleep:   Patient reports tossing and turning. She wakes up in pain and takes acetaminophen. She has to sleep on her right side.     > SE:   Patient denies side effects including- denies side effects currently    > Upcoming procedures: spinal injection possible but she is trying to avoid needing this    > Medications:    Medications tried in the  past:     Medication Patient Reported SIG Pain Result    Tramadol   Did not help   Gabapentin                    Other treatment modalities tried:  physical therapy/occupational therapy     Patient's preferred pharmacy: [facility] on [address] in [place]     Social History 04/13/23:   Alcohol Use: rare- only on cruises, and only 2 drinks in 2 weeks  Cannabis Use: no  Illicit Substances: no  Smoking: no    Medications: Medication reconciliation 04/13/23   Current Medications[1]    Allergies:   Sulfa (Sulfonamide Antibiotics)    Rash  Contrast Dye [Radiopaque Agent]    Hives    Comment:Reports has recently tolerated well  Hydrochlorothiazide    Other-Reaction in Comments    Comment:Patient reports  Januvia [Sitagliptin]    Other-Reaction in Comments    Comment:Patient reports  Keflex [Cephalexin]    Abdominal Pain  Lyrica [Pregabalin]    Other-Reaction in Comments    Comment:Patient reports  Omnipaque [Iohexol]    Other-Reaction in Comments    Comment:Patient reports  Prozac [Fluoxetine Hcl]    Other-Reaction in Comments    Comment:Patient reports  Topiramate    Other-Reaction in Comments    Comment:Hairfall       PHQ-9:       09/18/2022     8:47 AM 10/15/2022     8:33 AM 12/25/2022     9:19 AM   PHQ-9   Interest 2 3 3    Feeling 1 2 2    Sleeping 2 1 1    Tired 3 3 3    Appetite 2 2 0   Feeling Bad 1 1 1    Concentrating 0 0 0   Moving and Speaking 0 0 0   Suicidal Thoughts 0 0 0   Problems Somewhat difficult     PHQ-9 Total 11 12 10    PHQ-9 Affective Total 4 6 6    PHQ-9 Physical Total 7 6 4    PHQ-9 Cognitive Total 0 0 0          Total Score Depression Severity   1-4 Minimal Depression   5-9 Mild Depression   10-14 Moderate Depression   15-19 Moderately Severe Depression   20-27 Severe Depression        GAD-7:       06/01/2019    10:15 AM 08/31/2019    10:15 AM 12/10/2021    11:17 AM   GAD7 Results   Feeling nervous, anxious or on edge? 1-Several Days 1-Several Days    Not being able to stop or control worrying? 2-More than half the  days 1-Several days    Worrying too much about different things? 2-More than half the days 1-Several days    Trouble relaxing? 2-More than half the days 0-Not at all    Being so restless that it is hard to sit still? 0-Not at all 0-Not at all    Becoming easily annoyed or irritable? 2-More than half the days 1-Several days    Feeling afraid as if something awful might happen? 0-Not at all 0-Not at all    For office coding: Total Score 9 4    Date questionnaire was completed 06/01/2019 08/31/2019    Name of Provider documenting questionnaire Dr Grier Rocher Dr Grier Rocher    Feeling Nervous, Anxious, or on Edge   1   Not Being Able to Stop or Control Worrying   1   Worrying too Much About Different Things   1   Trouble Relaxing   0   Being so Restless That it is Hard to Sit Still   0   Becoming Easily Annoyed or Irritable   2   Feeling Afraid as if Something Awful Might Happen   0   GAD-7 Total Score   5   If you checked off any problems, how difficult have these problems made it for you to do your work, take care of things at home, or get along with other people?   Somewhat difficult         Total Score Anxiety Severity   1-4 Minimal Anxiety   5-9 Mild Anxiety   10-14 Moderate Anxiety   15-21 Severe Anxiety  Last UTOX:   No results found for: "BARBSSCRNU", "BENZOSSCRNU", "COCAINESCRNU", "OPIATESSCRU", "AMPHETSCRNU", "DRUGGCSCRNU"    Opiates Quant/Confirm,Urine:   No results found for: "CODEINE", "MORPHINE", "HYDROCODONE", "HYDROMORPH", "DIHYDROCOD", "NORHYDROCOD", "OXYCODONE", "OXYMORPHONE", "NOROXYCODONE", "6AM"    Medical notes are meant to convey information between members of our health care team. Therefore these notes require medical terminology for precision and efficiency.   Abbreviations may also be used. Choice of words might differ from common language due to nuances of meaning specific to healthcare.  If you have questions or concerns about this note, please schedule an appointment and we can discuss any  concerns at your convenience. Alteration of the medical record cannot be guaranteed.          [1]   Current Outpatient Medications   Medication Sig Dispense Refill    Albuterol (PROAIR HFA, PROVENTIL HFA, VENTOLIN HFA) 90 mcg/actuation inhaler INHALE 1 TO 2 PUFFS BY MOUTH EVERY 6 HOURS AS NEEDED FOR WHEEZING 8.5 g 0    Amlodipine (NORVASC) 10 mg Tablet Take 1 tablet by mouth every day. 90 tablet 3    Atorvastatin (LIPITOR) 20 mg Tablet Take 1 tablet by mouth every day at bedtime. 90 tablet 3    Blood Glucose Meter (ONETOUCH ULTRA2 METER) Kit 1 kit one time. 1 kit 0    Blood Sugar Diagnostic (ONETOUCH ULTRA TEST) Strips Use to test blood glucose 2x/day 100 strip 11    CloNIDine (CATAPRES) 0.1 mg Tablet Take 2 tablets by mouth every day at bedtime. Indications: "change of life" signs 180 tablet 3    Clopidogrel (PLAVIX) 75 mg Tablet Take 1 tablet by mouth every morning. 90 tablet 3    Hydrocortisone (ANUSOL-HC) 25 mg Suppository Insert 1 suppository into the rectum every 12 hours. 12 suppository 0    Losartan (COZAAR) 100 mg tablet Take 1 tablet by mouth every day. 90 tablet 3    Metformin (GLUCOPHAGE) 1,000 mg tablet Take 1 tablet by mouth once daily with a meal. 90 tablet 3    MethylPREDNIsolone (MEDROL DOSPACK) 4 mg tablet follow the package instruction 21 tablet 0    Metoclopramide (REGLAN) 5 mg Tablet Take 1 tablet by mouth once daily if needed (for nausea).      NYSTATIN 100,000 unit/gram Powder Apply a thin layer of powder to affected area 4 times a day as needed 60 g 1    Ondansetron (ZOFRAN-ODT) 8 mg disintegrating tablet Take 1 tablet by mouth every 8 hours if needed. 100 tablet 0    Pregabalin (LYRICA) 100 mg capsule Take 1 capsule by mouth 2 times daily. 60 capsule 3    Triamcinolone (KENALOG) 0.1 % Cream Apply to the affected area 2 times daily. 30 g 1     No current facility-administered medications for this visit.

## 2023-04-13 NOTE — Telephone Encounter (Signed)
Attempted to call pharmacy, on hold for >10 minutes.  Medication was re-ordered by [facility] MD on 04/10/23.

## 2023-04-13 NOTE — Telephone Encounter (Signed)
Last prescribed: 04/10/23    Last seen: 04/01/23    Rx not transmitted to pharmacy, please resend

## 2023-04-14 NOTE — Progress Notes (Unsigned)
Physical Therapy Treatment Note: 04/15/2023       Diagnosis:  Therapy diagnosis: Acute Lumbar disc disease with radiculopathy   Medical diagnosis: M51.16 (ICD-10-CM) - Lumbar disc disease with radiculopathy      Authorizing MD (First and last name) and PI#: [id number]) Linna Caprice, NP   Onset Date: 2 mo      Start of Care Date: 04/08/2023   Prior Level of function: independent-able to walk during cruise  Prior treatment for this diagnosis: no  Rehab potential: Fair     Precautions: none     Certification period end date (for Medicare patients): 07/07/2023  Estimated Discharge Date: 07/07/23     Plan of care:  Established 04/08/2023  1 times every 1-4 wks for a total of 6-12 treatments. Patient has 12 visits currently authorized. Authorization/Referral # [id number] . Expires 03/12/24. Additional authorization may be necessary.       To include   Therapeutic Procedure  Neuromuscular Re-education  Therapeutic Activity  Traction  Deep Soft Tissue Massage  Joint Mobilization  Modalities: hot packs and cold packs  Home Exercise Program  Patient/Caregiver Education     Goals:   Patient will be able to sit 30-45 minutes without increased symptoms  Patient will be able to be on feet 15-20 minutes without increased symptoms  Patient will be able to walk 15-20 minutes without increased symptoms  Patient will be able to sit and reach floor to tie shoes  Patient will be able to sleep 4-6 hours without increased symptoms  Patient will be independent with home exercise program and home recommendations       Fall risks Assessment:   No falls in past year. No throw rugs, poorly lighted areas, missing rails/grab bars in the home. No medications or conditions that affect stability or mobility.       Certification period end date (for Medicare patients): 07/07/2023  Estimated Discharge Date: 07/07/23       Total # of visits: 2/10    Total Treatment Time: 45    Pre pain: 7/10 with medication.   Post pain:       /10 less left ankle and  lower leg pain    Subjective: been wearing back brace all the time and is helping. Been moving around a lot last few days so brace.  She has been on oral prednisone and no help just like lyrica, Tylenol.    Aggs wallking, needs to sit to shop     She had a Pharm Consult for new medication for pain patch and will clear.  But she is waiting for clearance for new medication but PCP is out of the country.     She will call and schedule and appointment(s) for injection.     Home exercise program made her symptom(s) worse.     Inital  C/c-constant left hip and left lower extremity sharp dull   Constant Numbness/tingling lateral lower leg and top of ankle     Aggravating factors include: walking 1 min, standing 1 minutes, sitting 5 minutes, wakes several time/night.   Easing Factors include: tylenol      Objective:       Patient seen for the following treatment:     Therapeutic activities x 25 minutes   Instruct in purchase use, indications for tens unit use for pain    TENS unit Instruction to lowback and left hip in sitting x 20 minutes HAN3 setting at 25 intensity     Therapeutic exercise(s)  home exercise program  Access Code: [email or web address]  Exercises  - Hooklying Single Knee to Chest  - 2 x daily - 7 x weekly - 2 reps - 20 sec hold  - Supine Lower Trunk Rotation  - 2 x daily - 7 x weekly - 1-3 sets - 10 reps - 3-5 sec hold  - Ice  - 2-3 x daily - 7 x weekly - 15 min hold  - Heat  - 2-3 x daily - 7 x weekly - 15 min hold    Assessment: decreased pain in left lower extremity after tens but able to tolerate only 20 minutes and possibly related to position of sitting. She will schedule injection and get new med patch asap when MD returns to country. Advised to continue attempting home exercise program without pain.     Plan: continue physical therapy - as per plan of care.   Recheck in 2 week(s)  Progress low level stretching and stabilization exercise(s)   Patient to reach out with ? With TENS    Any changes to plan of care,  or goals: no   If so then plan of care will be sent to MD for approval.     In the event of unattended discharge, this note will serve as discharge document    Atilano Ina, [place]  WG#[id number]

## 2023-04-14 NOTE — Telephone Encounter (Signed)
Hello - I already spoke with the pharmacy on 12/6. They said they did not receive the electronic RX so I provided a verbal order on the phone. The script should be for 100mg  bid. When I look in the chart the order is also still there (it is not canceled). Do they not have documentation of the verbal order? Thank you.

## 2023-04-15 ENCOUNTER — Ambulatory Visit: Payer: Medicare Other | Admitting: PHYSICAL THERAPIST

## 2023-04-15 DIAGNOSIS — M5116 Intervertebral disc disorders with radiculopathy, lumbar region: Secondary | ICD-10-CM

## 2023-04-16 ENCOUNTER — Encounter: Payer: Self-pay | Admitting: "Endocrinology

## 2023-04-16 NOTE — Telephone Encounter (Signed)
No data available in Clarity or LibreView.  Routed to provider for review.

## 2023-04-20 ENCOUNTER — Encounter: Payer: Self-pay | Admitting: PAIN MANAGEMENT

## 2023-04-20 ENCOUNTER — Other Ambulatory Visit: Payer: Self-pay | Admitting: Student in an Organized Health Care Education/Training Program

## 2023-04-20 MED ORDER — BUPRENORPHINE 5 MCG/HOUR WEEKLY TRANSDERMAL PATCH
1.0000 | MEDICATED_PATCH | TRANSDERMAL | 0 refills | Status: DC
Start: 2023-04-20 — End: 2023-04-21

## 2023-04-20 NOTE — Progress Notes (Signed)
 Dear Dr. Drucella Georgi,     Arlie Lain.    Original authorization requested was authorized for Left L5 TFESI w/ fluoro. Referral details updated, new request for L5-S1 interlaminar ESI w/ fluoro obtained, and appt details on 04/23/23 updated. Lumbar interlaminar ESI and lumbar TFESI have the same PPI, so no new MyChart message sent to patient. No further action is required at this time.     Thank you    Sincerely,  Ashland Osmer  Patient Service Representative III  [facility]  Phone line [phone]  Fax [phone]

## 2023-04-20 NOTE — Progress Notes (Signed)
 LS 04/10/23 by MN/no fellow. Plan is for L L5 TFESI on 04/23/23..    Advised she can request Versed on the day of her procedure.     This encounter needs no further provider action, please close the encounter.

## 2023-04-20 NOTE — Telephone Encounter (Signed)
 Cures 04/20/23:        Last Name First name Date Filled Drug Name Compound Drug Strength Quantity   [name] 04/11/2023 PREGABALIN 100 MG 60   [name] 03/25/2023 PREGABALIN 25 MG 49   [name] 02/02/2023 TRAMADOL HYDROCHLORIDE 50 MG 6   [name] 01/27/2023 TRAMADOL HYDROCHLORIDE 50 MG 14   [name] 06/18/2022 OXYCODONE HYDROCHLORIDE 5 MG 5

## 2023-04-20 NOTE — Progress Notes (Signed)
 Hello - Margarine is supposed to be a L5-S1 interlaminar epidural steroid injection (not a transforaminal) - just wanted to make sure this is corrected/ she has [place] approval as she is scheduled for an injection on 12/19. Thank you !

## 2023-04-20 NOTE — Progress Notes (Signed)
 Spoke to patient. ID x 3.   Informed patient that we do not use topical before starting IV. Advised for her to keep hydrated, drink 2 hrs clear liquids before appointment.     This encounter needs no further provider action, please close the encounter.

## 2023-04-21 ENCOUNTER — Other Ambulatory Visit: Payer: Self-pay | Admitting: Student in an Organized Health Care Education/Training Program

## 2023-04-21 ENCOUNTER — Telehealth: Payer: Self-pay | Admitting: PAIN MANAGEMENT

## 2023-04-21 ENCOUNTER — Ambulatory Visit: Payer: Medicare Other

## 2023-04-21 DIAGNOSIS — G8929 Other chronic pain: Secondary | ICD-10-CM

## 2023-04-21 NOTE — Telephone Encounter (Signed)
 Paula Daugherty has been scheduled for a: pain procedures : Epidural Steroid Injection-Lumbar . Procedure date 12/19.    I have attempted to call this patient at their preferred phone number to perform pre-procedure nursing assessment for their upcoming procedure at the Pain Clinic. If patient calls back, please transfer to nursing.

## 2023-04-21 NOTE — Telephone Encounter (Signed)
 Received fax from [facility] for medication Buprenorphine 8mcg/hr patch with no associated DX wit the RX. Please reorder medication with DX attathced

## 2023-04-22 MED ORDER — BUPRENORPHINE 5 MCG/HOUR WEEKLY TRANSDERMAL PATCH
1.0000 | MEDICATED_PATCH | TRANSDERMAL | 0 refills | Status: DC
Start: 2023-04-22 — End: 2023-05-14

## 2023-04-23 ENCOUNTER — Ambulatory Visit: Payer: Medicare Other | Attending: Student in an Organized Health Care Education/Training Program

## 2023-04-23 ENCOUNTER — Other Ambulatory Visit: Payer: Self-pay | Admitting: Student in an Organized Health Care Education/Training Program

## 2023-04-23 ENCOUNTER — Ambulatory Visit: Payer: Medicare Other

## 2023-04-23 DIAGNOSIS — M5116 Intervertebral disc disorders with radiculopathy, lumbar region: Secondary | ICD-10-CM

## 2023-04-23 MED ORDER — NALOXONE 4 MG/ACTUATION NASAL SPRAY
NASAL | 1 refills | Status: DC
Start: 2023-04-23 — End: 2023-04-23

## 2023-04-23 MED ORDER — NALOXONE 4 MG/ACTUATION NASAL SPRAY
NASAL | 1 refills | Status: DC
Start: 2023-04-23 — End: 2023-04-27

## 2023-04-23 NOTE — Progress Notes (Signed)
 Clinical Pharmacist Chronic Pain and Opioid Stewardship Telephone Visit Note    Phone call to patient at [phone]. Spoke to patient.     Date of Pharmacist Intake:  04/13/23    Assessment/Plan:     Paula Daugherty presents with chronic pain that is potentially neuropathic in nature. Patient is currently titrating their current pain regimen Lyrica 100 mg twice daily. She is opiate naive. The patient endorses pain control is  unmanageable/intolerable and is not meeting functional goals. Current OMED is 0. Patient has the following opioid related risk factors:  concomitant gabapentinoid therapy, age 75 or older, and renal/hepatic insufficiency. The patient may also be experiencing other side effects from current regimen such as  -none per patient  . Last eGFR 03/11/23 46.     Paula Daugherty has uncontrolled nerve pain. She is slowly titrating up Lyrica as she may have had a reaction in the past but is not certain it was from Lyrica. She is working with the pain clinic. She was able to pick up buprenorphine (Butrans) 12/17 but pain clinic asked her not to use it in preparation for her injection today. She is currently stuck in traffic and is not sure if she will make her injection time today. We will need to touch bases again after today to see if her injection needs to be reschedule and when/if she will still need to start the buprenorphine (Butrans).     Recommendations:   - Revisit plan for starting buprenorphine (Butrans) 5 mcg/hr patch after pain clinic visit and possible injection today.  - Pend Narcan for PCP consideration.   - Provided education 12/6 on buprenorphine including indication, mechanism of action, possible side effects, and proper administration. Sent on My Chart as well 04/10/23.   - Limit acetaminophen to max 3,000 mg a day. Need to follow up with patient regarding strength taking.   - Extensive opioid education provided with emphasis on patient's specific risks.  - Recommend non pharmacological therapy  including: rest, physical therapy/occupational therapy, and mindful pacing of activities.    Risk Reduction Interventions:  - Narcan prescribed, education provided to patient and/or caregiver. Will pend for PCP.  - optimization of non-opioid adjuvant therapy  and reduction of APAP to NTE 3g/day    Follow-up on 04/30/23 via Telephone visit.      Paula Daugherty, Pharm. Paula Daugherty, BCPS  Primary Care Clinical Pharmacist  [facility]  [address]   [place], [place] [address]   [phone]       Subjective/Objective:  Paula Daugherty is a 77yr old female referred by Paula Crea, MD  for: Consider for buprenorphine? Opioid naive but with chronic pain uncontrolled       Interval History:  04/20/2023:  Paula Daugherty picked up buprenorphine [employer]) Tuesday but did not start yet. Pain clinic told her not to use it before injection today.  -She is trying to get to her injection appointment today but is stuck in traffic so she may not make it on time.       Current treatment plan:    Medication Patient Reported SIG Pain Result/ Notes   Lyrica 100 mg Twice daily  Just increased to this dose   Acetaminophen (patient unsure of strength ) 6-8 a day  helps                 Treatment History:  04/13/23: Lyrica 100 mg twice daily, acetaminophen 6-8 tabs a day (unknown strength)  CURES Reviewed: 04/23/23  Last Name First name Date Filled Drug Name Compound Drug Strength Quantity   [name] 04/11/2023 PREGABALIN 100 MG 60   [name] 03/25/2023 PREGABALIN 25 MG 49   [name] 02/02/2023 TRAMADOL HYDROCHLORIDE 50 MG 6   [name] 01/27/2023 TRAMADOL HYDROCHLORIDE 50 MG 14   [name] 06/18/2022 OXYCODONE HYDROCHLORIDE 5 MG 5     Current PPA: not on opioids   Last PCP visit: 04/01/23  Narcan available to patient: Yes, expired No    Pain History 04/13/23:  Patient states reason for referral is discuss pain medications.    Chronic Pain Indication:  M51.16 (ICD-10-CM) - Lumbar disc disease with radiculopathy     > Pain:  Reports pain is located in: lower back radiating all the way down her leg.    12/6:  Paula Daugherty is not taking opioids for chronic pain but her pain is uncontrolled and severely limiting ADLs.  -She is taking methylprednisolone from pain clinic but it is not helping so far.  -Pain from ADLs like standing in the shower is excruciating.   -She is titrating up Lyrica. She has visual hallucinations on it in the past. She is not sure if Lyrica was the cause of this and it not having problems so far on it. She is increasing slowly.     Patient describes degenerative disc disease with sciatica. Severe pain starting in low back/hip and extends down to feet and ankles. Shooting tingling burning nerve.     Pain score 7-8/10 on average, 7/10 on good days, 9/10 on bad days, 7 to 6/10 after medication (Lyrica and acetaminophen barely take the edge off and allows minimal ADLs). Patient reports a tolerable pain score is 2-3/10.     Pt notes pain is most intolerable in the all day/constant.    Relieving factors: PT/OT-just started last week, tried exercises at home and pain increased  Exacerbating factors: movement, prolong standing, bending, and walking  Any walking and any standing. She avoids moving.   Functional goals include: ADLs    > Sleep:   Patient reports tossing and turning. She wakes up in pain and takes acetaminophen. She has to sleep on her right side.     > SE:   Patient denies side effects including- denies side effects currently    > Upcoming procedures: spinal injection possible but she is trying to avoid needing this    > Medications:    Medications tried in the past:     Medication Patient Reported SIG Pain Result    Tramadol   Did not help   Gabapentin                    Other treatment modalities tried:  physical therapy/occupational therapy     Patient's preferred pharmacy: [facility] on [address] in [place]     Social History 04/13/23:   Alcohol  Use: rare- only on cruises, and only 2 drinks in 2 weeks  Cannabis Use: no  Illicit Substances: no  Smoking: no    Medications: Medication reconciliation 04/13/23   Current Medications[1]    Allergies:   Sulfa (Sulfonamide Antibiotics)    Rash  Contrast Dye [Radiopaque Agent]    Hives    Comment:Reports has recently tolerated well  Hydrochlorothiazide    Other-Reaction in Comments    Comment:Patient reports  Januvia [Sitagliptin]    Other-Reaction in Comments    Comment:Patient reports  Keflex [Cephalexin]    Abdominal Pain  Lyrica [Pregabalin]  Other-Reaction in Comments    Comment:Patient reports  Omnipaque [Iohexol]    Other-Reaction in Comments    Comment:Patient reports  Prozac [Fluoxetine Hcl]    Other-Reaction in Comments    Comment:Patient reports  Topiramate    Other-Reaction in Comments    Comment:Hairfall       PHQ-9:       09/18/2022     8:47 AM 10/15/2022     8:33 AM 12/25/2022     9:19 AM   PHQ-9   Interest 2 3 3    Feeling 1 2 2    Sleeping 2 1 1    Tired 3 3 3    Appetite 2 2 0   Feeling Bad 1 1 1    Concentrating 0 0 0   Moving and Speaking 0 0 0   Suicidal Thoughts 0 0 0   Problems Somewhat difficult     PHQ-9 Total 11 12 10    PHQ-9 Affective Total 4 6 6    PHQ-9 Physical Total 7 6 4    PHQ-9 Cognitive Total 0 0 0          Total Score Depression Severity   1-4 Minimal Depression   5-9 Mild Depression   10-14 Moderate Depression   15-19 Moderately Severe Depression   20-27 Severe Depression        GAD-7:       06/01/2019    10:15 AM 08/31/2019    10:15 AM 12/10/2021    11:17 AM   GAD7 Results   Feeling nervous, anxious or on edge? 1-Several Days 1-Several Days    Not being able to stop or control worrying? 2-More than half the days 1-Several days    Worrying too much about different things? 2-More than half the days 1-Several days    Trouble relaxing? 2-More than half the days 0-Not at all    Being so restless that it is hard to sit still? 0-Not at all 0-Not at all    Becoming easily annoyed or irritable? 2-More  than half the days 1-Several days    Feeling afraid as if something awful might happen? 0-Not at all 0-Not at all    For office coding: Total Score 9 4    Date questionnaire was completed 06/01/2019 08/31/2019    Name of Provider documenting questionnaire Dr Grier Rocher Dr Tamala Fothergill

## 2023-04-23 NOTE — Progress Notes (Addendum)
 Procedure:  Lumbar, L5-S1 left paramedian Interlaminar Epidural Steroid Injection with Fluoroscopy       Epidural Steroid Injection # 1  Series # 1    An IV was not needed for this procedure.   Tuohy Needle Type: 20 gauge 3.5 inch Tuohy  Contrast Dye - none - has allergy   Injected Steroid Solution: dexamethasone 10 mg and 1% Lidocaine MPF 1 ml    Estimated Blood Loss - <2 ml  Drains: None  Specimens Removed: None  Urine Output - Not Measured  Complications: no apparent complications  Outcome: good    Informed Consent:  The patient's condition and proposed procedures, risks, and alternatives were discussed with the patient or responsible party.  The patient's / responsible party's questions were answered.   The patient / responsible party appeared to understand and chose to proceed.  Informed consent was obtained.    Procedure in Detail:  The patient was taken back to the OR fluoroscopy suite and placed in a prone position with a pillow under the abdomen to decrease lordosis.  The skin overlying the injection site was prepped and draped in an aseptic fashion. The target vertebral interspace (see above) was identified by AP fluoroscopy.     Procedural Pause:  A procedural pause verifying correct patient, medical record number, allergies, medications to be administered, current vital signs, and surgical site was performed immediately prior to beginning the procedure.  The skin and subcutaneous tissue overlying the target site of injection was anesthetized using 5 ml of 1% lidocaine MPF with a 25-gauge, 1 -inch needle.   The above noted Tuohy needle was advanced under fluoroscopic guidance towards the epidural space. Lateral fluoroscopic imaging was used to confirm depth. The epidural space was identified using a loss of resistance to air technique. A catheter was not placed. A microbore extension tubing was attached to the needle to minimize any movement of the needle during injection or syringe change.  After  negative aspiration for heme or CSF, no contrast was injected as noted above. After repeat negative aspiration for heme or CSF, the above noted steroid solution was slowly injected in increments. The Tuohy needle was then removed.   The heart rate, pulse oximetry, and blood pressure were continuously monitored throughout the procedure (see Nursing Flowsheet for details).  The patient tolerated the procedure well..The patient was carefully escorted to the recovery room in stable condition.  Patient was monitored by RN for recovery period. After meeting discharge criteria, the patient was discharged with driver.     Pre- and post-procedural Diagnosis    ICD-10-CM    1. Lumbar disc disease with radiculopathy  M51.16 EPIDURAL STEROID INJECTIONS     Naloxone (NARCAN) 4 mg/actuation Nasal Spray         DISCUSSION:  Conditions of the spine that result in spinal nerve root irritation such as disc protrusions, spinal stenosis, or post surgical radiculitis often respond favorably to epidural steroid injections.  The goal in performing epidural steroid injections is to provide relief from pain and permit greater function.   Today we performed an interlaminar epidural steroid injection.  The patient was informed that it may take several days for the epidural steroid medication to reach its full efficacy and she should continue her regular pain medications as prescribed.  The patient was advised to relax and avoid any heavy lifting or excessive bending for the rest of the day.   She was advised that she may return to her usual activities after  24 hours if she is otherwise feeling well.    The patient was advised not to bathe or soak in water for 24 hours but that showering would be acceptable.  The patient was instructed that if she experienced fever or chills, new weakness, new sensory changes, any changes in bowel or bladder habits, worsening back pain, new headache, neck stiffness, or other new symptoms, that she should  contact the pain clinic immediately or dial 911 if unable to reach the pain clinic.  We recommended to the patient that she not travel out of the area for the next 4 days following the procedure so that she can be reevaluated if necessary.        RECOMMENDATIONS:  We will plan to have the patient follow up in 3 months for an evaluation of the efficacy of today's procedure..   No medications were prescribed at today's visit.  Additional recommendations: None      - The patient was seen, evaluated, and care plan was developed with the pain fellow, Dr. Sampson Goon, on the date indicated in the Pain fellow's note. I agree with the findings and plan as outlined in the Pain fellow's note.  - INFORMED CONSENT:  The patient's condition and proposed procedures, risks, and alternatives were discussed with the patient or responsible party.  The patient's / responsible party's questions were answered.   The patient / responsible party appeared to understand and chose to proceed.  Informed consent was obtained.  - PROCEDURE PAUSE:  A procedure pause verifying correct patient, medical record number, allergies, and surgical site was performed immediately prior to beginning the procedure.  - I was present for the entire procedure.  Please see procedure note in EMR for details.  - The patient was instructed and educated on all aspects of the plan of care.  The patient acknowledged the plan of care.    Candace Gallus MD  Attending Physician  Department of Anesthesiology & Pain Medicine

## 2023-04-23 NOTE — Patient Instructions (Signed)
PAIN MANAGEMENT CLINIC  AFTER VISIT INSTRUCTIONS    PLEASE NOTIFY STAFF IF YOU HAVE A PACEMAKER, AICD, SPINAL CORD STIMULATOR OR OTHER IMPLANTABLE DEVICE BEFORE YOUR NEXT APPOINTMENT.     Pain Clinic [phone] or [phone]: available during business hours, 8 am - 5 pm  On-call physician [phone] THIS IS A NUMERICAL PAGER -DO NOT LEAVE VOICE MESSAGE.  Physician is available after business hours, 7 days per week including holidays.        Procedure Performed Today:     Epidural Steroid Injection-Lumbar    If you experience any of the following symptoms after your procedure, please notify our office or on-call physician immediately (see above for contact information):  fever (increased oral temperature)  bleeding or swelling at the injection site,   drainage, rash or redness at the injection site   possible signs of infection   increased pain at the injection site  worsening of your usual pain  severe headache  new or worsening numbness   new arm and/or leg weakness, or   changes in bowel and/or bladder function: urinating or defecating on yourself and not knowing that you did it.      PLEASE FOLLOW ALL INSTRUCTIONS CAREFULLY    Do not drive or operate heavy machinery for 12 hours.  Do not engage in strenuous activity (e.g., lifting or pushing heavy objects or repeated bending) for 24 hours.    Do not take a bath, swim or use Jacuzzi for 24 hours after procedure. (A shower is fine).  Remove any Band-Aids when you get home.   Use cold/ice, as needed for comfort.  We recommend the use of cold therapy alternating on for 20 minutes, off for 20 minutes.   Do not apply direct heat (heating pad or heat packs) to the injection site for 24 hours.    Resume your usual medications, unless instructed otherwise by your Pain Physician.    If you are on warfarin (Coumadin) or other blood thinner, resume this medication as instructed by your prescribing  Physician.    Keep a record of your response to the injection you had today.    How much relief did you get?  Were you able to decrease the use of any of your pain medications?  Were you able to increase your level of activity?  How long did the relief last?     NOTE:  If your Pain Physician ordered a procedure, lab, diagnostic study (x-ray, MRI, CT, ultrasound, EMG, EKG, etc.), or specialty referral for you today, please be aware that some insurance carriers may require authorization before they can be scheduled. If these cannot be scheduled at the time of discharge today, you will be contacted with the scheduled appointment.  If you are not contacted within 3-5 business days, please call our office at [phone].        Procedure To Schedule FOR YOUR Next Visit:   Follow Up    VERY IMPORTANT  INSTRUCTIONS FOR YOUR FOLLOW UP APPOINTMENT  Please remember to arrive 15 minutes prior to your appointment time.  This will allow time for check in.  If you arrive after your scheduled appointment time your appointment could be rescheduled or your visit with the physician will be shortened.      NOTE: Please be aware of which location you are scheduled for your next appointment. The [facility] has 2 locations in [place]: (1) the  Ambulatory Care Center   at the [facility] and (2) the Spine Center at the [address]. If you have any questions regarding the location of your next appointment, please call our office: [phone].      PLEASE FOLLOW ALL INSTRUCTIONS CAREFULLY     Please call to confirm the instructions and medication holds specific to your procedure, [phone].    If you are SICK, it is not safe to do your procedure. Therefore, please call us as soon as possible to reschedule. Since we do keep a waiting list, your courtesy will allow us to schedule another patient. Please, call our office if you have any  questions.    If you are NOT IN PAIN OR HAVE MINIMAL PAIN, please call to postpone your procedure (unless otherwise instructed by your Pain Physician).        MEDICATIONS    If your Pain Physician prescribes medications for you, please anticipate when you will require a refill.  It can take 1-3 business days to complete the process. Contact your pharmacy for medication refills. Your pharmacy will contact the Pain Clinic office with detailed medication information.         Driving When You Are Taking Medications    For most people, driving represents freedom, control and independence. Driving enables most people to get to the places they want or need to go. For many people, driving is important economically - some drive as part of their job or to get to and from work.   Driving is a complex skill. Our ability to drive or operate heavy or dangerous machinery safely can be affected by changes in our physical, emotional and mental condition. The goal of this brochure is to help you and your health care professional talk about how your medications may affect your ability to drive safely.     How can medications affect my driving or ability to operate heavy or dangerous machinery?   People take medications for a variety of reasons. Those can include:          allergies          anxiety          colds          depression          diabetes          heart and cholesterol conditions          high blood pressure          muscle spasms          pain          Parkinson's disease          schizophrenia   Medications may be prescribed by your doctor or purchased over-the-counter without a doctor's prescription. Many individuals also take herbal supplements. Some of these medications and supplements may cause a variety of reactions that may make it more difficult for you to drive a car or operate heavy or dangerous machinery safely. These reactions may include:           sleepiness          blurred vision          dizziness          slowed movement          fainting          inability to focus or pay attention          nausea     Often people take more than one medication at a time. The combination of different medications can cause problems for some people. This is especially true for older adults because they take more medications than any other age group. Due to changes in the body as people age, older adults are more prone to medication related problems. The more medications you take, the greater your risk that your medicines will affect your ability to drive safely. To help avoid problems, it is important that at least once a year you talk to your doctor or pharmacist about all the medications - both prescription and over-the-counter - you are taking. Also let your professional know what herbal supplements, if any, you are taking. Do this even if your medications and supplements are not currently causing you a problem.     Can I still drive or operate heavy or dangerous machinery safely if I am taking medications?     Yes, most people can drive or operate heavy or dangerous machinery safely if they are taking medications. It depends on the effect those medications - both prescription and over-the-counter - have on your coordination and mental function. In some cases you may not be aware of the effects. But, in many instances, your doctor can help to minimize the negative impact of your medications on your driving or operate heavy or dangerous machinery in several ways. Your doctor may be able to:          adjust the dose;          adjust the timing of doses or when you take the medication;          add an exercise or nutrition program to lessen the need for medication; and          change medication to one that causes less drowsiness.     What can I do if I am taking medications?   Talk to your doctor honestly.   When your doctor prescribes a medicine for you, ask about side  effects. How should you expect the medicine to affect your ability to drive or operate heavy or dangerous machinery? Remind your doctor of other medications - both prescription and over-the-counter - and herbal supplements you are taking, especially if you see more than one doctor. Talking honestly with your doctor also means telling the doctor if you are not taking all or any of

## 2023-04-23 NOTE — Progress Notes (Signed)
 [facility]  Department of Anesthesiology and Pain Medicine  [address], [address]  [place], [place] [address]  Phone: [phone]    Fax: [phone]    Date: 04/23/2023    Re: [name]  MR#: [id number]  Date of Birth: 1945/11/26  Age: 50yr    Procedure:  L5-S1 interlaminar ESI w/ fluoro     See Procedure Note for Discussion and Recommendations    Dear Dr. Delford Field, Richelle Ito, MD    We had the pleasure of treating your patient, Paula Daugherty, today at the [facility], [facility].             VAS Pain Scale  (0-10) Current Pain Score     (0-10) Current Pain Score 7   Pain Location     Pain Side/ Orientation       Pre-Procedural Screening  Pre-Procedure Screening Driver verified; NPO   NPO for Solids > or = 8 Yes   NPO for Liquids > or = 2  Yes   Anti-Coag (Y/N) Yes   Anti-Coag Med List Clopidogrel (Plavix) - 7 day hold   Anti-Coag Last Taken 04/12/23   Anti-Coag Last Time Taken     Anti-Coag Held within Parameters Yes   NSAIDS (Y/N) No   NSAIDS Med List     NSAIDS Last Taken     NSAIDS Med Last Time      NSAIDS Held within Parameters     Pregnant (Y/N) No   Implantable Devices NONE   Recent illness < or = 7 days No   Rash (Y/N) No   Open Wounds (Y/N) No   Recent Antibiotics < or = 7 days (Y/N) No   Steroids < or = 1 month (Y/N) No   Recent Surgery < or = 3 months  No   Upcoming Surgery within next 3 months (Y/N) No                    PREVIOUS DIAGNOSTIC STUDIES: The lab/imaging results below were reviewed.    Lab Results   Lab Name Value Date/Time    WBC 6.6 03/11/2023 10:26 AM    WBC 8.9 08/30/2015 08:35 AM    HGB 12.1 03/11/2023 10:26 AM    HGB 14.4 08/30/2015 08:35 AM    HCT 36.4 03/11/2023 10:26 AM    HCT 44.6 08/30/2015 08:35 AM    PLT 198 03/11/2023 10:26 AM    PLT 217 08/30/2015 08:35 AM       Lab Results   Lab Name Value Date/Time    NA 141 03/11/2023 10:26 AM    NA 142 08/30/2015 08:35 AM    K 4.3 03/11/2023 10:26 AM    K 4.0 08/30/2015 08:35 AM    CL 105  03/11/2023 10:26 AM    CL 104 08/30/2015 08:35 AM    CO2 24 03/11/2023 10:26 AM    CO2 22 (L) 08/30/2015 08:35 AM    BUN 22 (H) 03/11/2023 10:26 AM    BUN 19 08/30/2015 08:35 AM    CR 1.22 (H) 03/11/2023 10:26 AM    CR 0.88 08/30/2015 08:35 AM    GLU 132 (H) 03/11/2023 10:26 AM    GLU 139 (H) 08/30/2015 08:35 AM       Lab Results   Component Value Date    PGLU 116 (H) 09/02/2022       Lab Results   Lab Name Value Date/Time  AST 21 12/19/2022 07:23 AM    AST 71 (H) 08/30/2015 08:35 AM    ALT 18 12/19/2022 07:23 AM    ALT 69 (H) 08/30/2015 08:35 AM    ALP 119 12/19/2022 07:23 AM    ALP 104 08/30/2015 08:35 AM    ALB 4.0 12/19/2022 07:23 AM    ALB 4.2 08/30/2015 08:35 AM    TP 7.7 12/19/2022 07:23 AM    TP 8.5 (H) 08/30/2015 08:35 AM    TBIL 1.0 12/19/2022 07:23 AM    TBIL 0.9 08/30/2015 08:35 AM       Lab Results   Lab Name Value Date/Time    HGBA1C 6.8 (H) 03/11/2023 10:26 AM    HGBA1C 6.4 (H) 09/09/2022 08:25 AM    HGBA1C 5.8 (H) 06/09/2022 08:12 AM    HGBA1C 6.0 (H) 02/10/2022 09:19 AM    HGBA1C 6.8 (H) 11/08/2021 03:50 PM    HGBA1C 7.1 (H) 08/06/2021 08:59 AM    HGBA1C 6.7 (H) 05/07/2021 08:52 AM    HGBA1C 6.5 (H) 01/18/2021 11:07 AM    HGBA1C 6.3 (H) 10/11/2020 10:54 AM    HGBA1C 6.4 (H) 06/19/2020 10:30 AM    HGBA1C 6.7 (H) 02/23/2020 10:27 AM    HGBA1C 6.7 (H) 07/22/2019 09:48 AM    HGBA1C 6.3 (H) 04/20/2019 10:29 AM    HGBA1C 6.7 (H) 12/23/2018 10:10 AM    HGBA1C 6.5 (H) 09/14/2018 08:43 AM    HGBA1C 7.1 (H) 04/19/2018 10:58 AM    HGBA1C 6.8 (H) 10/19/2017 09:38 AM    HGBA1C 6.6 (H) 06/29/2017 02:54 PM    HGBA1C 6.9 (H) 03/06/2017 10:58 AM    HGBA1C 7.8 (H) 11/11/2016 11:09 AM    HGBA1C 8.0 (H) 07/09/2016 11:57 AM    HGBA1C 6.1 (H) 03/17/2016 10:32 AM    HGBA1C 6.7 (H) 12/11/2015 08:49 AM    HGBA1C 6.6 (H) 08/30/2015 08:35 AM    HGBA1C 7.7 (H) 03/23/2015 09:04 AM    HGBA1C 6.9 (H) 12/06/2014 11:45 AM    HGBA1C 6.7 (H) 08/22/2014 08:15 AM    HGBA1C 7.6 (H) 04/10/2014 03:05 PM    HGBA1C 7.0 (H) 12/08/2013  10:30 AM    HGBA1C 7.0 (H) 07/11/2013 09:57 AM       Lab Results   Component Value Date    A1CPOC 6.8 (Abnl) 11/01/2019       Lab Results   Lab Name Value Date/Time    INR 1.11 12/19/2022 07:23 AM    INR 1.02 12/08/2013 10:30 AM       Lab Results   Component Value Date    INR 1.11 12/19/2022           Medications Taking[1]    Allergies[2]      Problem List[3]    Past Medical History[4]      Paula Daugherty's past medical, family, and social history were reviewed.    PHYSICAL EXAM:    Vitals:    04/23/23 1055 04/23/23 1100   BP: (!) 147/67 (!) 155/66   Pulse: 67    Resp: 16    Temp: 36.9 C (98.4 F)    TempSrc: Skin    SpO2: 98%    Weight: 76 kg (167 lb 8.8 oz)    Height: 1.575 m (5\' 2" )      Body mass index is 30.65 kg/m.   Mallampati image  Constitutional: Normally developed, no deformities,well groomed., healthy, alert, no distress, pleasant affect, cooperative, skin warm, dry, and pink  Airway - Required  for Procedures: Mallampati class 2, Oral Eval: Mouth opening normal, neck ROM  full, Thyroid-Mentum distance in fingerbreaths  3  Respiratory: clear to auscultation.  Cardiovascular:  normal rate and regular rhythm, no murmurs, clicks, or gallops.      ASA Status:  3 - Moderate to severe systemic disease that limits activity but not incapacitating    Preprocedural Diagnosis    ICD-10-CM    1. Lumbar disc disease with radiculopathy  M51.16 EPIDURAL STEROID INJECTIONS           No orders of the defined types were placed in this encounter.      - The patient was instructed and educated on all aspects of the plan of care.  The patient acknowledged the plan of care.  - I saw and evaluated the patient with my attending physician, Dr. Lulu Riding.    Jairo Ben, MD PGY-5  Pain Medicine Fellow  Department of Anesthesiology & Pain Medicine           Paula Daugherty: If you are reviewing this progress note and have questions about the meaning or medical terms being used, please schedule an appointment or bring it up at your next  follow-up appointment.  Medical notes are a communication tool between medical professionals and require medical terms to be used for efficiency. You can also look up terms via on-line medical dictionaries such as Medline Plus at MuscleTreatments.it.html          [1]   No outpatient medications have been marked as taking for the 04/23/23 encounter (Office Visit) with Candace Gallus, MD.   [2]   Allergies  Allergen Reactions    Sulfa (Sulfonamide Antibiotics) Rash    Contrast Dye [Radiopaque Agent] Hives     Reports has recently tolerated well    Hydrochlorothiazide Other-Reaction in Comments     Patient reports    Januvia [Sitagliptin] Other-Reaction in Comments     Patient reports    Keflex [Cephalexin] Abdominal Pain    Lyrica [Pregabalin] Other-Reaction in Comments     Patient reports    Omnipaque [Iohexol] Other-Reaction in Comments     Patient reports    Prozac [Fluoxetine Hcl] Other-Reaction in Comments     Patient reports    Topiramate Other-Reaction in Comments     Hairfall   [3]   Patient Active Problem List  Diagnosis    Diabetes (HCC)    Depression with anxiety    Stress at home    Fibromyalgia    Hypertension    GERD (gastroesophageal reflux disease)    Hyperlipidemia with target LDL less than 70    Abnormal LFTs    Renal cyst ( 6.4 cm cyst left kidney)    Cough    Disorder of liver    Macroalbuminuric diabetic nephropathy (HCC)    History of actinic keratoses    Cataract    Ptosis of eyelid    Liver fibrosis    Adrenal nodule (HCC)    Medication Therapy Auth    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HCC)    Mixed conductive and sensorineural hearing loss of both ears    Neoplasm of uncertain behavior of skin    Abdominal pain, generalized    Nausea without vomiting    Asymptomatic microscopic hematuria    Subcutaneous nodule    Angina pectoris (HCC)    Mild episode of recurrent major depressive disorder (HCC)    NASH (nonalcoholic steatohepatitis)    Iron deficiency  anemia due to chronic  blood loss    Kidney disorder   [4]   Past Medical History:  Diagnosis Date    Angina pectoris (HCC) 09/23/2017    Anxiety     Asthma     Cirrhosis (HCC)     Constipation, chronic     Depres

## 2023-04-27 ENCOUNTER — Encounter: Payer: Self-pay | Admitting: Student in an Organized Health Care Education/Training Program

## 2023-04-27 DIAGNOSIS — M5116 Intervertebral disc disorders with radiculopathy, lumbar region: Secondary | ICD-10-CM

## 2023-04-27 MED ORDER — NALOXONE 4 MG/ACTUATION NASAL SPRAY
NASAL | 1 refills | Status: DC
Start: 2023-04-27 — End: 2023-10-20

## 2023-04-27 NOTE — Telephone Encounter (Signed)
 Pharmacy Refill Optimization (PRO)    Request to resend prescription because pharmacy did not receive the most recently approved prescription. Called and pharmacy and confirmed with [name].     Refill request has been pended to the pharmacist for review per PRO P&T Approved Protocol 04/27/2023

## 2023-04-27 NOTE — Telephone Encounter (Signed)
 Refill authorized per P&T Protocol 04/27/2023

## 2023-04-30 ENCOUNTER — Encounter: Payer: Self-pay | Admitting: PAIN MANAGEMENT

## 2023-04-30 ENCOUNTER — Ambulatory Visit: Payer: Medicare Other

## 2023-04-30 NOTE — Progress Notes (Signed)
 This encounter needs no further provider action, please close the encounter.

## 2023-04-30 NOTE — Progress Notes (Signed)
 Clinical Pharmacist Chronic Pain and Opioid Stewardship Telephone Visit Note    Phone call to patient at [phone]. Spoke to patient.     Date of Pharmacist Intake:  04/13/23    Assessment/Plan:     Paula Daugherty presents with chronic pain that is potentially neuropathic in nature. Patient is currently titrating their current pain regimen Lyrica 100 mg twice daily. She is opiate naive. The patient endorses pain control is  unmanageable/intolerable and is not meeting functional goals. Current OMED is 0. Patient has the following opioid related risk factors:  concomitant gabapentinoid therapy, age 75 or older, and renal/hepatic insufficiency. The patient may also be experiencing other side effects from current regimen such as  -none per patient  . Last eGFR 03/11/23 46.     Paula Daugherty has uncontrolled nerve pain. She has been slowly titrating up Lyrica as she may have had a reaction in the past to it but is not certain it was from Lyrica. She reports no relief and she is considering stopping it.     She is working with the pain clinic. She was able to pick up buprenorphine (Butrans) 12/17 but pain clinic asked her not to use it in preparation for her epidural injection 04/23/23. She reports no pain relief at all in 7 days. Per patient she was told to wait 7 days then start buprenorphine (Butrans) if no effect from the epidural.     Paula Daugherty reports taking acetaminophen arthritis 650 mg 3 tabs 4-5  times a day, up to 9,750 mg a day. Considerable over max dose. She reports she cannot take NSAIDS due to decreased renal function.     Recommendations:   - Start buprenorphine (Butrans) 5 mcg/hr patch today. Can consider dose increase to 7.5 mcg/hr in 1 week.   - She is considering stopping pregabalin. Educated patient she would need to taper off. Asked her to discuss with PCP first.    - Provided education 12/6 on buprenorphine including indication, mechanism of action, possible side effects, and proper administration. Sent on My  Chart as well 04/10/23. Reiterated today 12/26.   - Limit acetaminophen to max 3,000 mg a day. She is taking 3-4 x max dose. Educated patient on adverse effects.  - Recommend trying OTC topicals to help reduce pain. Recommend Voltaren (diclofenac) gel or lidocaine gels/creams. Patch unlikely to be covered with [employer].   - Extensive opioid education provided with emphasis on patient's specific risks.  - Recommend non pharmacological therapy including: rest, physical therapy/occupational therapy, and mindful pacing of activities.    Risk Reduction Interventions:  - Narcan prescribed, education provided to patient and/or caregiver.   - optimization of non-opioid adjuvant therapy  and reduction of APAP to NTE 3g/day    Follow-up on 05/07/23 via Telephone visit.  Route to PCP.       Hollace Hayward, Pharm. Orson Eva, BCPS  Primary Care Clinical Pharmacist  [facility]  [address]   [place], [place] [address]   [phone]       Subjective/Objective:  Paula Daugherty is a 77yr old female referred by Garth Bigness, MD  for: Consider for buprenorphine? Opioid naive but with chronic pain uncontrolled       Interval History:  04/30/23:  Paula Daugherty had an epidural injection with the pain clinic 04/23/23. No relief yet in 7 days. She reports they told her to wait 7 days before applying the buprenorphine (Butrans) patch so she plans to apply it today.  -  Confirmed the strength of acetaminophen she is taking. Tylenol arthritis (which is 650 mg). She reports needing to take 3 tabs 4-5 times a day.  -She feels no relief from Lyrica  and is considering stopping it.   -She reports she cannot use nsaids due to her kidneys.   -She has not tried any topical medications.       Current treatment plan:    Medication Patient Reported SIG Pain Result/ Notes   Lyrica 100 mg Twice daily  Not helping    Acetaminophen 650 mg 3 tabs 4-5 times a day helps                  Treatment History:  04/13/23: Lyrica 100 mg twice daily, acetaminophen 6-8 tabs a day (unknown strength)  04/30/23: Start buprenorphine (Butrans) 5 mcg/hr patch.      CURES Reviewed: 04/30/23  Last Name First name Date Filled Drug Name Compound Drug Strength Quantity   Sagona Oreoluwa 04/20/2023 BUPRENORPHINE 5 MCG 4   Presutti Ily 04/11/2023 PREGABALIN 100 MG 60   Monsour Helayna 03/25/2023 PREGABALIN 25 MG 49   Veith Ghalia 02/02/2023 TRAMADOL HYDROCHLORIDE 50 MG 6   Barto Yazmine 01/27/2023 TRAMADOL HYDROCHLORIDE 50 MG 14   Machuca Mckynlie 06/18/2022 OXYCODONE HYDROCHLORIDE 5 MG 5     Current PPA: not on opioids   Last PCP visit: 04/01/23  Narcan available to patient: Yes, expired No    Pain History 04/13/23:  Patient states reason for referral is discuss pain medications.    Chronic Pain Indication: M51.16 (ICD-10-CM) - Lumbar disc disease with radiculopathy     > Pain:  Reports pain is located in: lower back radiating all the way down her leg.    12/6:  Paula Daugherty is not taking opioids for chronic pain but her pain is uncontrolled and severely limiting ADLs.  -She is taking methylprednisolone from pain clinic but it is not helping so far.  -Pain from ADLs like standing in the shower is excruciating.   -She is titrating up Lyrica. She has visual hallucinations on it in the past. She is not sure if Lyrica was the cause of this and it not having problems so far on it. She is increasing slowly.     Patient describes degenerative disc disease with sciatica. Severe pain starting in Daugherty back/hip and extends down to feet and ankles. Shooting tingling burning nerve.     Pain score 7-8/10 on average, 7/10 on good days, 9/10 on bad days, 7 to 6/10 after medication (Lyrica and acetaminophen barely take the edge off and allows minimal ADLs). Patient reports a tolerable pain score is 2-3/10.     Pt notes pain is most intolerable in the all day/constant.    Relieving factors: PT/OT-just started last week, tried exercises at home and pain  increased  Exacerbating factors: movement, prolong standing, bending, and walking  Any walking and any standing. She avoids moving.   Functional goals include: ADLs    > Sleep:   Patient reports tossing and turning. She wakes up in pain and takes acetaminophen. She has to sleep on her right side.     > SE:   Patient denies side effects including- denies side effects currently    > Upcoming procedures: spinal injection possible but she is trying to avoid needing this    > Medications:    Medications tried in the past:     Medication Patient Reported SIG Pain Result    Tramadol   Did not  help   Gabapentin      Cyclobenazprine 5 mg  Did not help            Other treatment modalities tried:  physical therapy/occupational therapy     Patient's preferred pharmacy: [facility] on [address] in [place]     Social History 04/13/23:   Alcohol Use: rare- only on cruises, and only 2 drinks in 2 weeks  Cannabis Use: no  Illicit Substances: no  Smoking: no    Medications: Medication reconciliation 04/13/23   Current Medications[1]    Allergies:   Sulfa (Sulfonamide Antibiotics)    Rash  Contrast Dye [Radiopaque Agent]    Hives    Comment:Reports has recently tolerated well  Hydrochlorothiazide    Other-Reaction in Comments    Comment:Patient reports  Januvia [Sitagliptin]    Other-Reaction in Comments    Comment:Patient reports  Keflex [Cephalexin]    Abdominal Pain  Lyrica [Pregabalin]    Other-Reaction in Comments    Comment:Patient reports  Omnipaque [Iohexol]    Other-Reaction in Comments    Comment:Patient reports  Prozac [Fluoxetine Hcl]    Other-Reaction in Comments    Comment:Patient reports  Topiramate    Other-Reaction in Comments    Comment:Hairfall       PHQ-9:       09/18/2022     8:47 AM 10/15/2022     8:33 AM 12/25/2022     9:19 AM   PHQ-9   Interest 2 3 3    Feeling 1 2 2    Sleeping 2 1 1    Tired 3 3 3    Appetite 2 2 0   Feeling Bad 1 1 1    Concentrating 0 0 0   Moving and Speaking 0 0 0   Suicidal Thoughts 0 0 0   Problems  Somewhat difficult     PHQ-9 Total 11 12 10    PHQ-9 Affective Total 4 6 6    PHQ-9 Physical Total 7 6 4    PHQ-9 Cognitive Total 0 0 0          Total Score Depression Severity   1-4 Minimal Depression   5-9 Mild Depression   10-14 Moderate Depression   15-19 Moderately Sever

## 2023-05-01 ENCOUNTER — Encounter: Payer: Self-pay | Admitting: Student in an Organized Health Care Education/Training Program

## 2023-05-01 DIAGNOSIS — M5116 Intervertebral disc disorders with radiculopathy, lumbar region: Secondary | ICD-10-CM

## 2023-05-01 MED ORDER — PREGABALIN 150 MG CAPSULE
150.0000 mg | ORAL_CAPSULE | Freq: Two times a day (BID) | ORAL | 2 refills | Status: DC
Start: 2023-05-01 — End: 2023-05-26

## 2023-05-01 NOTE — Telephone Encounter (Signed)
 Forwarding to Provider to review and respond to patient.  Response to other mychart message today

## 2023-05-01 NOTE — Telephone Encounter (Signed)
 Forwarding to Provider to review and respond to patient.      Note to Provider: This MyChart message may be appropriate to be converted into an E-Visit encounter because it involves: Request to change Medication or Treatment plan (NOT for controlled substances)

## 2023-05-04 NOTE — Progress Notes (Signed)
Physical Therapy Treatment Note: 05/07/2023      Diagnosis:  Therapy diagnosis: Acute Lumbar disc disease with radiculopathy   Medical diagnosis: M51.16 (ICD-10-CM) - Lumbar disc disease with radiculopathy      Authorizing MD (First and last name) and PI#: [id number]) Linna Caprice, NP   Onset Date: 2 mo      Start of Care Date: 04/08/2023   Prior Level of function: independent-able to walk during cruise  Prior treatment for this diagnosis: no  Rehab potential: Fair     Precautions: none     Certification period end date (for Medicare patients): 07/07/2023  Estimated Discharge Date: 07/07/23     Plan of care:  Established 04/08/2023  1 times every 1-4 wks for a total of 6-12 treatments. Patient has 12 visits currently authorized. Authorization/Referral # [id number] . Expires 03/12/24. Additional authorization may be necessary.       To include   Therapeutic Procedure  Neuromuscular Re-education  Therapeutic Activity  Traction  Deep Soft Tissue Massage  Joint Mobilization  Modalities: hot packs and cold packs  Home Exercise Program  Patient/Caregiver Education     Goals:   Patient will be able to sit 30-45 minutes without increased symptoms  Patient will be able to be on feet 15-20 minutes without increased symptoms  Patient will be able to walk 15-20 minutes without increased symptoms  Patient will be able to sit and reach floor to tie shoes  Patient will be able to sleep 4-6 hours without increased symptoms  Patient will be independent with home exercise program and home recommendations       Fall risks Assessment:   No falls in past year. No throw rugs, poorly lighted areas, missing rails/grab bars in the home. No medications or conditions that affect stability or mobility.       Certification period end date (for Medicare patients): 07/07/2023  Estimated Discharge Date: 07/07/23       Total # of visits: 3/10    Total Treatment Time: 30    Pre pain: 9/10 with medication.   Post pain:       9/10 less left ankle and lower  leg pain    Subjective: had steroid injection 2 week(s) ago and no change in symptom(s) or pain. MD are increasing medication of lyrica and pain patch increased last week.  No benefit TENS unit.  Home exercise program are too painful. Wearing compression stocking to help strength and pain in left ankle and lower leg    Overall hip pain is worse and drags left foot      Inital  C/c-constant left hip and left lower extremity sharp dull   Constant Numbness/tingling lateral lower leg and top of ankle     Aggravating factors include: walking 1 min, standing 1 minutes, sitting 5 minutes, wakes several time/night.   Easing Factors include: tylenol      Objective:       Patient seen for the following treatment:   Therapeutic activities x 30  minutes for POC    Therapeutic exercise(s) home exercise program  Access Code: [email or web address]  Exercises  - Hooklying Single Knee to Chest  - 2 x daily - 7 x weekly - 2 reps - 20 sec hold  - Supine Lower Trunk Rotation  - 2 x daily - 7 x weekly - 1-3 sets - 10 reps - 3-5 sec hold  - Ice  - 2-3 x daily - 7 x weekly - 15 min hold  -  Heat  - 2-3 x daily - 7 x weekly - 15 min hold    Assessment: patient has had 3 Physcial Therapy appts and has made no progress with Physcial Therapy and has had increased pain. She recently had an steroid epidural injection without benefit at this time. Trial of low level Home exercise program and modalities has not helped. Patient is working with Pharmacy and MD for pain management.     Patient made informed consent for today's treatment. The procedures, treatment, and plan of care were discussed, their questions were answered, and the patient contributed and agreed with the process.  Handout for home exercise program provided to patient when indicated.     Plan: Plan to hold Physcial Therapy at this time and patient to call when pain medication and/or injection helps to manage pain enough to tolerate another trial of Physcial Therapy     Any changes to plan of  care, or goals: no   If so then plan of care will be sent to MD for approval.     In the event of unattended discharge, this note will serve as discharge document    Atilano Ina, [place]  JW#[id number]

## 2023-05-07 ENCOUNTER — Ambulatory Visit: Payer: Medicare Other | Admitting: PHYSICAL THERAPIST

## 2023-05-07 ENCOUNTER — Ambulatory Visit (INDEPENDENT_AMBULATORY_CARE_PROVIDER_SITE_OTHER): Payer: Medicare Other

## 2023-05-07 ENCOUNTER — Encounter: Payer: Self-pay | Admitting: "Endocrinology

## 2023-05-07 DIAGNOSIS — M5116 Intervertebral disc disorders with radiculopathy, lumbar region: Secondary | ICD-10-CM

## 2023-05-07 NOTE — Telephone Encounter (Signed)
Telephone Triage Call  Telephone call to Paula Daugherty. Verified patient's name, DOB, MRN, and/or address at initiation of call.       SUBJECTIVE/OBJECTIVE: Paula Daugherty, 78yr old reporting blood glucose this morning of 207.  Patient states that she just spoke with her pharmacist and her opioid patch dose will be increased to double tonight (two patches at a time).  Patient states that she believes that the high numbers are coming from the medications that she is taking.    She denies any recent illness, nausea, vomiting, or dehydration, and states that she has not had any recent sick contacts.    She states that she wanted to update MD as her blood sugars have been running in and near the 200s.      Allergies reviewed: Sulfa (sulfonamide antibiotics), Contrast dye [radiopaque agent], Hydrochlorothiazide, Januvia [sitagliptin], Keflex [cephalexin], Lyrica [pregabalin], Omnipaque [iohexol], Prozac [fluoxetine hcl], and Topiramate      Problem List reviewed: Problem List[1]      PLAN:  Consult with MD, then call patient with recommendation/update in plan of care as needed and Forwarding to MD for review and recommendation.  Patient is aware that RN will route to MD for review.      Reviewed emergency/911 precautions with patient. Advised patient to call back directly if there are further questions/concerns, if symptoms fail to improve as anticipated, or if symptoms worsen.  Office and after-hours contact information provided.       Patient has no barrier to learning and verbalizes understanding of instructions and plan.      ClearTriage    Protocol Used: Diabetes - High Blood Sugar    Negative Triage Questions:  * Unconscious or difficult to awaken  * Acting confused (e.g., disoriented, slurred speech)  * Very weak (e.g., can't stand)  * Sounds like a life-threatening emergency to the triager  * [1] Vomiting AND [2] signs of dehydration (e.g., very dry mouth, lightheaded, dark urine)  * [1] Blood glucose > 240 mg/dL  (98.1 mmol/L) AND [1] rapid breathing  * Blood glucose > 500 mg/dL (91.4 mmol/L)  * [7] Blood glucose > 240 mg/dL (82.9 mmol/L) AND [5] urine ketones moderate-large (or more than 1+)  * [1] Blood glucose > 240 mg/dL (62.1 mmol/L) AND [3] blood ketones > 1.4 mmol/L  * [1] Blood glucose > 240 mg/dL (08.6 mmol/L) AND [5] vomiting AND [3] unable to check for ketones (in blood or urine)  * [1] New-onset diabetes suspected (e.g., frequent urination, weak, weight loss) AND [2] vomiting or rapid breathing  * Vomiting lasts > 4 hours  * Patient sounds very sick or weak to the triager  * Fever > 100.4 F (38.0 C)  * Blood glucose > 400 mg/dL (78.4 mmol/L)  * [6] Blood glucose > 300 mg/dL (96.2 mmol/L) AND [9] two or more times in a row  * [1] Symptoms of high blood sugar (e.g., abnormally thirsty, frequent urination, weight loss) AND [2] not able to test blood glucose AND [3] pregnant  * [1] Caller has URGENT medication or insulin pump question AND [2] triager unable to answer question  * [1] Blood glucose > 240 mg/dL (52.8 mmol/L) AND [4] pregnant  * [1] Symptoms of high blood sugar (e.g., abnormally thirsty, frequent urination, weight loss) AND [2] not able to test blood glucose  * New-onset diabetes suspected (e.g., abnormally thirsty, frequent urination, weight loss)  * [1] Caller has NON-URGENT medication or insulin pump question AND [2] triager unable to  answer question  * [1] Blood glucose > 300 mg/dL (46.9 mmol/L) AND [6] uses insulin (e.g., insulin-dependent, all people with type 1 diabetes)  * [1] Blood glucose 240 - 300 mg/dL (29.5 - 28.4 mmol/L) AND [2] uses insulin (e.g., insulin-dependent, all people with type 1 diabetes)  * [1] Blood glucose > 300 mg/dL (13.2 mmol/L) AND [4] does not  use insulin (e.g., not insulin-dependent; most people with type 2 diabetes)  * [1] Blood glucose 240 - 300 mg/dL (40.1 - 02.7 mmol/L) AND [2] does not  use insulin (e.g., not insulin-dependent; most people with type 2 diabetes)  *  Blood glucose 70-240 mg/dL (3.9 -25.3 mmol/L)  * Sick day rules for people with diabetes who use insulin, questions about  * Sick day rules for people with diabetes who do not use insulin, questions about    Edd Fabian, RN CDCES                            [1]   Patient Active Problem List  Diagnosis    Diabetes (HCC)    Depression with anxiety    Stress at home    Fibromyalgia    Hypertension    GERD (gastroesophageal reflux disease)    Hyperlipidemia with target LDL less than 70    Abnormal LFTs    Renal cyst ( 6.4 cm cyst left kidney)    Cough    Disorder of liver    Macroalbuminuric diabetic nephropathy (HCC)    History of actinic keratoses    Cataract    Ptosis of eyelid    Liver fibrosis    Adrenal nodule (HCC)    Medication Therapy Auth    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HCC)    Mixed conductive and sensorineural hearing loss of both ears    Neoplasm of uncertain behavior of skin    Abdominal pain, generalized    Nausea without vomiting    Asymptomatic microscopic hematuria    Subcutaneous nodule    Angina pectoris (HCC)    Mild episode of recurrent major depressive disorder (HCC)    NASH (nonalcoholic steatohepatitis)    Iron deficiency anemia due to chronic blood loss    Kidney disorder

## 2023-05-07 NOTE — Progress Notes (Signed)
Clinical Pharmacist Chronic Pain and Opioid Stewardship Telephone Visit Note    Phone call to patient at [phone]. Spoke to patient.     Date of Pharmacist Intake:  04/13/23    Assessment/Plan:     Paula Daugherty presents with chronic pain that is potentially neuropathic in nature. Patient is currently titrating their current pain regimen of Lyrica increased to 150 mg twice daily 05/01/23 and she started buprenorphine (Butrans) 5 mcg/hr patch on 04/30/23. She was opiate naive prior. The patient endorses pain control is  unmanageable/intolerable and is not meeting functional goals. Current OMED  ~10 mg on mixed agonist/antagonist . Patient has the following opioid related risk factors:  concomitant gabapentinoid therapy, age 78 or older, and renal/hepatic insufficiency. The patient may also be experiencing other side effects from current regimen such as  -none per patient  . Last eGFR 03/11/23 46.     Paula Daugherty has uncontrolled nerve pain. She has been slowly titrating up Lyrica as she may have had a reaction in the past to it but is not certain it was from Lyrica. She reports no relief and she was considering stopping it but is increasing the dose to see if she can get relief with a higher dose.     She is working with the pain clinic. Epidural on 04/23/23 has afforded no relief. She started buprenorphine (Butrans) 5 mcg/hr patch 1 week ago. No pain relief yet.     Paula Daugherty reports taking acetaminophen arthritis 650 mg 3 tabs 4-5  times a day, up to 9,750 mg a day. Considerable over max dose. She reports she cannot take NSAIDS due to decreased renal function.     Recommendations:   - Increase buprenorphine (Butrans) 5 mcg/hr patch to 10 mcg/hr. She can use two of the current patches if PCP agrees.   - She will follow up with pain clinic surgeon in January and pain clinic provider in March. She may be able to see Dr. Lulu Riding sooner.   - Sent My Chart message on how to sign up for HME courses. Recommended Healthy Living with  Chronic pain.   - Limit acetaminophen to max 3,000 mg a day. She is taking 3-4 x max dose. Educated patient on adverse effects.  - Recommend trying OTC topicals to help reduce pain. Recommend Voltaren (diclofenac) gel or lidocaine gels/creams. Patch unlikely to be covered with [employer].   - Extensive opioid education provided with emphasis on patient's specific risks.  - Recommend non pharmacological therapy including: rest, physical therapy/occupational therapy, and mindful pacing of activities.  -She is interested in trying vitamins. Recommended magnesium 200 mg daily OTC. Monitor for diarrhea.     Risk Reduction Interventions:  - Narcan prescribed, education provided to patient and/or caregiver.   - optimization of non-opioid adjuvant therapy  and reduction of APAP to NTE 3g/day    Follow-up on 05/14/23 via Telephone visit.  Route to PCP.     Hollace Hayward, Pharm. Orson Eva, BCPS  Primary Care Clinical Pharmacist  [facility]  [address]   [place], [place] [address]   [phone]       Subjective/Objective:  Paula Daugherty is a 78yr old female referred by Garth Bigness, MD  for: Consider for buprenorphine? Opioid naive but with chronic pain uncontrolled       Interval History:  05/07/23:  Paula Daugherty has been on buprenorphine (Butrans) 5 mcg/hr for 7 days.  -Her Lyrica was increased Monday to 150 mg twice daily.  So far no SEs as she may have had in the past.   -She is very frustrated that she feels she is trying everything and is not getting any pain relief.  -She will see the pain management surgeon Monday. She will see the clinic also in March.  -She will try compression socks.   -She is doing physical therapy. She will see them today.  -She is interested vitamins or supplements.     Current treatment plan:    Medication Patient Reported SIG Pain Result/ Notes   Lyrica 150 mg Twice daily  Not helping yet, just increased     Acetaminophen 650 mg 3 tabs 4-5 times a day helps   Buprenorphine (Butrans) 5 mcg/hr 1 patch weekly  No relief             Treatment History:  04/13/23: Lyrica 100 mg twice daily, acetaminophen 6-8 tabs a day (unknown strength)  04/30/23: Start buprenorphine (Butrans) 5 mcg/hr patch.    05/07/23: Increase buprenorphine (Butrans) to 10 mcg/hr    CURES Reviewed: 05/07/23  Last Name First name Date Filled Drug Name Compound Drug Strength Quantity   [name] 04/20/2023 BUPRENORPHINE 5 MCG 4   [name] 04/11/2023 PREGABALIN 100 MG 60   [name] 03/25/2023 PREGABALIN 25 MG 49   [name] 02/02/2023 TRAMADOL HYDROCHLORIDE 50 MG 6   [name] 01/27/2023 TRAMADOL HYDROCHLORIDE 50 MG 14   [name] 06/18/2022 OXYCODONE HYDROCHLORIDE 5 MG 5     Current PPA: not on opioids   Last PCP visit: 04/01/23  Narcan available to patient: Yes, expired No    Pain History 04/13/23:  Patient states reason for referral is discuss pain medications.    Chronic Pain Indication: M51.16 (ICD-10-CM) - Lumbar disc disease with radiculopathy     > Pain:  Reports pain is located in: lower back radiating all the way down her leg.    12/6:  Paula Daugherty is not taking opioids for chronic pain but her pain is uncontrolled and severely limiting ADLs.  -She is taking methylprednisolone from pain clinic but it is not helping so far.  -Pain from ADLs like standing in the shower is excruciating.   -She is titrating up Lyrica. She has visual hallucinations on it in the past. She is not sure if Lyrica was the cause of this and it not having problems so far on it. She is increasing slowly.     Patient describes degenerative disc disease with sciatica. Severe pain starting in Daugherty back/hip and extends down to feet and ankles. Shooting tingling burning nerve.     Pain score 7-8/10 on average, 7/10 on good days, 9/10 on bad days, 7 to 6/10 after medication (Lyrica and acetaminophen barely take the edge off and allows minimal ADLs). Patient reports a tolerable  pain score is 2-3/10.     Pt notes pain is most intolerable in the all day/constant.    Relieving factors: PT/OT-just started last week, tried exercises at home and pain increased  Exacerbating factors: movement, prolong standing, bending, and walking  Any walking and any standing. She avoids moving.   Functional goals include: ADLs    > Sleep:   Patient reports tossing and turning. She wakes up in pain and takes acetaminophen. She has to sleep on her right side.     > SE:   Patient denies side effects including- denies side effects currently    > Upcoming procedures: spinal injection possible but she is trying to avoid needing  this    > Medications:    Medications tried in the past:     Medication Patient Reported SIG Pain Result    Tramadol   Did not help   Gabapentin      Cyclobenazprine 5 mg  Did not help            Other treatment modalities tried:  physical therapy/occupational therapy     Patient's preferred pharmacy: [facility] on [address] in [place]     Social History 04/13/23:   Alcohol Use: rare- only on cruises, and only 2 drinks in 2 weeks  Cannabis Use: no  Illicit Substances: no  Smoking: no    Medications: Medication reconciliation 04/13/23   Current Medications[1]    Allergies:   Sulfa (Sulfonamide Antibiotics)    Rash  Contrast Dye [Radiopaque Agent]    Hives    Comment:Reports has recently tolerated well  Hydrochlorothiazide    Other-Reaction in Comments    Comment:Patient reports  Januvia [Sitagliptin]    Other-Reaction in Comments    Comment:Patient reports  Keflex [Cephalexin]    Abdominal Pain  Lyrica [Pregabalin]    Other-Reaction in Comments    Comment:Patient reports  Omnipaque [Iohexol]    Other-Reaction in Comments    Comment:Patient reports  Prozac [Fluoxetine Hcl]    Other-Reaction in Comments    Comment:Patient reports  Topiramate    Other-Reaction in Comments    Comment:Hairfall       PHQ-9:       09/18/2022     8:47 AM 10/15/2022     8:33 AM 12/25/2022     9:19 AM   PHQ-9   Interest 2 3 3     Feeling 1 2 2    Sleeping 2 1 1    Tired 3 3 3    Appetite 2 2 0   Feeling Bad 1 1 1    Concentrating 0 0 0   Moving and Speaking 0 0 0   Suicidal Thoughts 0 0 0   Problems Somewhat difficult     PHQ-9 Total 11 12 10    PHQ-9 Affective Total 4 6 6    PHQ-9 Physical Total 7 6 4    PHQ-9 Cognitive Total 0 0 0          Total Score Depression Severity   1-4 Minimal Depression   5-9 Mild Depression   10-14 Moderate Depression   15-19 Moderately Severe Depression   20-27 Severe Depression        GAD-7:       06/01/2019    10:15 AM 08/31/2019    10:15 AM 12/10/2021    11:17 AM   GAD7 Results   Feeling nervous, anxious or on edge? 1-Several Days 1-Several Days    Not being able to stop or control worrying? 2-More than half the days 1-Several days    Worrying too much about different things? 2-More than half the days 1-Several days    Trouble relaxing? 2-More than half the days 0-Not at all    Being so restless that it is hard to sit still? 0-Not at all 0-Not at all    Becoming easily annoyed or irritable? 2-More than half the days 1-Several days    Feeling afraid as if something awful might happen? 0-Not at all 0-Not at all    For office coding: Total Score 9 4    Date questionnaire was completed 06/01/2019 08/31/2019    Name of Provider documenting questionnaire Dr Grier Rocher Dr Grier Rocher    Feeling Nervous, Anxious, or  on Edge   1   Not Being Able to Stop or Control Worrying   1   Worrying too Much About Different Things   1   Trouble Relaxing   0   Being so Restless That it is Hard to Sit Still   0   Becoming Easily Annoyed or Irritable   2   Feeling Afraid as if Something Awful Might Happen   0   GAD-7 Total Score   5   If you checked off any problems, how difficult have these problems made it for you to do your work, take care of things at home, or get along with other people?   Somewhat difficult         Total Score Anxiety Severity   1-4 Minimal Anxiety   5-9 Mild Anxiety   10-14 Moderate Anxiety   15-21 Severe Anxiety        Last  UTOX:   No results found for: "BARBSSCRNU", "BENZOSSCRNU", "COCAINESCRNU", "OPIATESSCRU", "AMPHETSCRNU", "DRUGGCSCRNU"    Opiates Quant/Confirm,Urine:   No results found for: "CODEINE", "MORPHINE", "HYDROCODONE", "HYDROMORPH", "DIHYDROCOD", "NORHYDROCOD", "OXYCODONE", "OXYMORPHONE", "NOROXYCODONE", "6AM"    Medical notes are meant to convey information between members of our health care team. Therefore these notes require medical terminology for precision and efficiency.   Abbreviations may also be used. Choice of words might differ from common language due to nuances of meaning specific to healthcare.  If you have questions or concerns about this note, please schedule an appointment and we can discuss any concerns at your convenience. Alteration of the medical record cannot be guaranteed.            [1]   Current Outpatient Medications   Medication Sig Dispense Refill    Albuterol (PROAIR HFA, PROVENTIL HFA, VENTOLIN HFA) 90 mcg/actuation inhaler INHALE 1 TO 2 PUFFS BY MOUTH EVERY 6 HOURS AS NEEDED FOR WHEEZING 8.5 g 0    Amlodipine (NORVASC) 10 mg Tablet Take 1 tablet by mouth every day. 90 tablet 3    Atorvastatin (LIPITOR) 20 mg Tablet Take 1 tablet by mouth every day at bedtime. 90 tablet 3    Blood Glucose Meter (ONETOUCH ULTRA2 METER) Kit 1 kit one time. 1 kit 0    Blood Sugar Diagnostic (ONETOUCH ULTRA TEST) Strips Use to test blood glucose 2x/day 100 strip 11    Buprenorphine (BUTRANS) 5 mcg/hour Patch Apply 1 patch to the skin one time each week. 4 patch 0    CloNIDine (CATAPRES) 0.1 mg Tablet Take 2 tablets by mouth every day at bedtime. Indications: "change of life" signs 180 tablet 3    Clopidogrel (PLAVIX) 75 mg Tablet Take 1 tablet by mouth every morning. 90 tablet 3    Hydrocortisone (ANUSOL-HC) 25 mg Suppository Insert 1 suppository into the rectum every 12 hours. 12 suppository 0    Losartan (COZAAR) 100 mg tablet Take 1 tablet by mouth every day. 90 tablet 3    Metformin (GLUCOPHAGE) 1,000 mg  tablet Take 1 tablet by mouth once daily with a meal. 90 tablet 3    MethylPREDNIsolone (MEDROL DOSPACK) 4 mg tablet follow the package instruction 21 tablet 0    Metoclopramide (REGLAN) 5 mg Tablet Take 1 tablet by mouth once daily if needed (for nausea).      Naloxone (NARCAN) 4 mg/actuation Nasal Spray Use only when an opioid overdose emergency is suspected.  Spray into one nostril upon signs of opioid overdose. Call 911.   Additional doses may be given  every 2 to 3 minutes until emergency medical assistance arrives.  Alternate nostrils with each dose.  Use each nasal spray only one time. 1 each 1    Ondansetron (ZOFRAN-ODT) 8 mg disintegrating tablet Take 1 tablet by mouth every 8 hours if needed. 100 tablet 0    Pregabalin (LYRICA) 150 mg capsule Take 1 capsule by mouth 2 times daily. 60 capsule 2    Triamcinolone (KENALOG) 0.1 % Cream Apply to the affected area 2 times daily. 30 g 1     No current facility-administered medications for this visit.

## 2023-05-11 ENCOUNTER — Encounter: Payer: Self-pay | Admitting: Student in an Organized Health Care Education/Training Program

## 2023-05-11 ENCOUNTER — Other Ambulatory Visit: Payer: Self-pay

## 2023-05-11 ENCOUNTER — Ambulatory Visit: Payer: Medicare Other | Attending: Orthopaedic Surgery | Admitting: PHYSICIAN ASSISTANT

## 2023-05-11 DIAGNOSIS — M48061 Spinal stenosis, lumbar region without neurogenic claudication: Secondary | ICD-10-CM

## 2023-05-11 DIAGNOSIS — G8929 Other chronic pain: Secondary | ICD-10-CM

## 2023-05-11 DIAGNOSIS — D699 Hemorrhagic condition, unspecified: Secondary | ICD-10-CM

## 2023-05-11 DIAGNOSIS — M5116 Intervertebral disc disorders with radiculopathy, lumbar region: Secondary | ICD-10-CM

## 2023-05-11 NOTE — Progress Notes (Signed)
Video Visit Note     I performed this clinical encounter by utilizing a real time telehealth video connection between my location and the patient's location. The patient's location was confirmed during this visit. I obtained verbal consent from the patient to perform this clinical encounter utilizing video and prepared the patient by answering any questions they had about the telehealth interaction.    [facility] Follow-up Note    Date of visit: 05/11/2023    Dear patient, if you are reviewing this progress note and have questions about it's meaning or medical terms being used, please schedule an appointment or bring it up at your next follow-up visit.  The purpose of a chart note is for concise and efficient communication between providers .      CHIEF COMPLAINT:  low back pain, LLE pain     PATIENT SUMMARY:   Paula Daugherty is a 78yr -old  female  here in follow up for left L5/S1 lumbar radiculopathy with CT lumbar showing moderate central stenosis at L4-L5 and Left L5-S1 NF narrowing.     H/o chronic axial back pain that resolved/improved with physical therapy in the past. About 8-9 months ago started experiencing atraumatic back and LLE pain. Pain describes as sharp/shooting starting in the left lower back and radiating down the lateral/posterior leg into the ankle and to the foot with associated numbness/tinging. Pain is constant but worst with any prolonged standing/walking and improves mildly with rest. No focal weakness but generalized LLE weakness due to pain. Since last visit feels like has had a few episodes where leg wants to give out.      Tried gabapentin 900mg  today w/out relief. switched to Lyrica  slowly working on titration up w/out any relief.   Tried motrin/tylenol. Tried at home exercises with worsening pain.      Since then pt underwent the ESI at L5-S1 w/out any relief even for a short period of time and has started butrans with mild relief.            REVIEW OF SYSTEMS:  There is  no report of fever/ chills/ sweats; nausea/ vomiting/ diarrhea or changes in bowel or bladder function.      PHYSICAL EXAMINATION:  LMP 05/06/1983   Constitutional:  NAD.      Psych: Oriented x 4  Mood/Affect pleasant.     IMAGING/ DIAGNOSTIC STUDIES:  Imaging reviewed with the patient today and showed :    Ct lumbar 01/23/23     IMPRESSION:     1. No acute fracture or subluxation of the lumbar spine.  2. Degenerative changes of lower lumbar spine most pronounced at L4-L5  with suspected left subarticular protrusion on bulge contributing  multifactorial moderate spinal canal narrowing.  3. L5-S1 left greater than right mild foraminal narrowing     Lumbar xray's     IMPRESSION:     1. Lumbar disc degeneration and facet joint arthropathy, more  pronounced in the lower lumbar spine.  IMPRESSION/ RECOMMENDATIONS:  Paula Daugherty returns today in follow up for L5 LLE radiculopathy with CT lumbar showing moderate central stenosis at L4-L5 and Left L5-S1 NF narrowing.   Has underwent LESI w/out any relief even for a short period of time. We discussed that given ongoing pain would like to obtain a CT myelogram to better assess for neural compression not found on the MRI given that pt is unable to get MRI due to h/o stapedectomy and unsure If the wire is Mri compatible.  She is very hesitant about the procedure and even unsure about any possible surgical solutions.   I will place the order and have pt complete when/if ready.   Can f/u with Pain and discuss possible TFESI vs repeat ESI and continue working with pain.   All questions were answered and pt agrees with plan.         Follow up in :  8-12 weeks to re-evaluate/review CT myelogram if completed     I spent a total of  26 minutes today on this patient's visit.     Gregg Holster Fidela Salisbury  Dept of Orthopedic Surgery  [facility]  Adult and Pediatric Surgery

## 2023-05-11 NOTE — Telephone Encounter (Signed)
Advised appointment for evaluation

## 2023-05-13 ENCOUNTER — Encounter: Payer: Self-pay | Admitting: Student in an Organized Health Care Education/Training Program

## 2023-05-13 ENCOUNTER — Ambulatory Visit: Payer: Medicare Other | Admitting: PHYSICAL THERAPIST

## 2023-05-13 ENCOUNTER — Ambulatory Visit: Payer: Medicare Other | Admitting: Student in an Organized Health Care Education/Training Program

## 2023-05-13 VITALS — BP 135/72 | HR 67 | Ht 62.0 in | Wt 170.0 lb

## 2023-05-13 DIAGNOSIS — M5442 Lumbago with sciatica, left side: Secondary | ICD-10-CM

## 2023-05-13 DIAGNOSIS — G8929 Other chronic pain: Secondary | ICD-10-CM

## 2023-05-13 DIAGNOSIS — M5116 Intervertebral disc disorders with radiculopathy, lumbar region: Secondary | ICD-10-CM

## 2023-05-13 MED ORDER — DULOXETINE 20 MG CAPSULE,DELAYED RELEASE
20.0000 mg | DELAYED_RELEASE_CAPSULE | Freq: Every day | ORAL | 11 refills | Status: DC
Start: 2023-05-13 — End: 2023-06-25

## 2023-05-13 NOTE — Patient Instructions (Signed)
Thank you for letting me participate in your care.  It was very nice to see you today!    Please go to the pharmacy to pick up your medication. Take as prescribed   If you are checking into lab or x-ray please check into the FRONT DESK first.    If you are not getting your labs done today, you can come in any time during business hours and walk into lab. X-Ray on site also takes walk ins.    IMPORTANT THINGS TO KNOW:   If you had a referral placed today, please give it 10 to 14 business days before calling about the referral as that is the down time it takes for the insurance and referral department to go through the process.    Mychart messages, results, and prescription refills may take up to 72 hours for a response.   Thank you for your patience.    Please call if you have any questions or concerns. Our phone number is [phone]    Follow up as discussed.     FYI:  A new federal rule requires test results to be released to you immediately, even if your provider has not yet had a chance to review and interpret them for you. Your care team may need up to about 3 business days to review your results and explain them, if needed. We appreciate your understanding.     Dr. Deliana Avalos  Family Medicine Physician   [facility] [place]

## 2023-05-13 NOTE — Progress Notes (Signed)
[facility]- [place]  Family Medicine     Chief Complaint   Patient presents with    Back Pain         ASSESSMENT and PLAN:    1. Chronic left-sided low back pain with left-sided sciatica    2. Lumbar disc disease with radiculopathy         Assessment & Plan  Chronic left-sided low back pain with left-sided sciatica  Lumbar disc disease with radiculopathy  Severe, constant pain radiating from the hip to the ankle, with numbness in the toes and up to the knee. Pain is not responsive to current regimen of Lyrica 150mg  BID and Buprenorphine 10mg  patch. Patient has tried physical therapy, TENS unit, heat, ice, and a steroid pack with no relief.  -Increase Buprenorphine patch as discussed with pain management tomorrow   -Add Cymbalta 20mg  daily for pain management.  -Order Ankle Brachial Index to assess for peripheral artery disease.  -Order EMG to further evaluate nerve involvement.  -Follow-up in 2 weeks via phone visit to assess response to medication changes.    Hip Pain  Left hip is tender to touch and feels larger to the patient, though not visibly swollen. Pain radiates down the leg and is exacerbated by standing and walking.  -consider X-ray of left hip and possibly SI joint or pelvis based on exam findings.  -continue to follow up with pain management for possible intervention if structural issue identified.    Imbalance  Patient reports occasional feelings of legs buckling and has started using a cane for stability.  -Continue to monitor and reassess at follow-up.    General Health Maintenance  -Continue to coordinate care with pain management and physical therapy as needed.         SUBJECTIVE:  Paula Daugherty is a 78yr old female  here for chief complaint noted above.    History of Present Illness  Ms. Beem, a 78 year old female with a history of cerebrovascular stroke, presents with severe, constant, and excruciating pain in her left leg, hip, and ankle. The pain is so severe that it  disrupts her sleep and impairs her mobility. She describes the pain as constant and excruciating, with no periods of relief. The pain is worse with standing and walking, and she has difficulty finding a comfortable position even when sitting or lying down. She also reports some numbness in her left leg, extending from her hip to the top of her knee.    Despite being on Lyrica 150mg  twice a day and a 10mg  buprenorphine patch for pain management, she reports no relief from her symptoms. She has also tried physical therapy, a TENS unit, heat, ice, and a steroid pack, all without success. She has been referred to multiple healthcare providers for her pain management, but so far, no interventions have been successful in alleviating her pain.    She is scheduled for a CT myelogram to investigate possible nerve compression in her spine, but she is hesitant about this procedure due to the pain and her fear of needles. She expresses a desire for a clearer understanding of her condition and a more effective pain management plan.    ROS:  As noted in HPI. 10 point review of systems was otherwise negative.     Current Outpatient Medications   Medication Sig    Albuterol (PROAIR HFA, PROVENTIL HFA, VENTOLIN HFA) 90 mcg/actuation inhaler INHALE 1 TO 2 PUFFS BY MOUTH EVERY 6 HOURS AS NEEDED FOR WHEEZING  Amlodipine (NORVASC) 10 mg Tablet Take 1 tablet by mouth every day.    Atorvastatin (LIPITOR) 20 mg Tablet Take 1 tablet by mouth every day at bedtime.    Blood Glucose Meter (ONETOUCH ULTRA2 METER) Kit 1 kit one time.    Blood Sugar Diagnostic (ONETOUCH ULTRA TEST) Strips Use to test blood glucose 2x/day    Buprenorphine (BUTRANS) 5 mcg/hour Patch Apply 1 patch to the skin one time each week.    CloNIDine (CATAPRES) 0.1 mg Tablet Take 2 tablets by mouth every day at bedtime. Indications: "change of life" signs    Clopidogrel (PLAVIX) 75 mg Tablet Take 1 tablet by mouth every morning.    Duloxetine (CYMBALTA) 20 mg Delayed  Release Capsule Take 1 capsule by mouth every day.    Hydrocortisone (ANUSOL-HC) 25 mg Suppository Insert 1 suppository into the rectum every 12 hours.    Losartan (COZAAR) 100 mg tablet Take 1 tablet by mouth every day.    Metformin (GLUCOPHAGE) 1,000 mg tablet Take 1 tablet by mouth once daily with a meal.    MethylPREDNIsolone (MEDROL DOSPACK) 4 mg tablet follow the package instruction    Metoclopramide (REGLAN) 5 mg Tablet Take 1 tablet by mouth once daily if needed (for nausea).    Naloxone (NARCAN) 4 mg/actuation Nasal Spray Use only when an opioid overdose emergency is suspected.  Spray into one nostril upon signs of opioid overdose. Call 911.   Additional doses may be given every 2 to 3 minutes until emergency medical assistance arrives.  Alternate nostrils with each dose.  Use each nasal spray only one time.    Ondansetron (ZOFRAN-ODT) 8 mg disintegrating tablet Take 1 tablet by mouth every 8 hours if needed.    Pregabalin (LYRICA) 150 mg capsule Take 1 capsule by mouth 2 times daily.    Triamcinolone (KENALOG) 0.1 % Cream Apply to the affected area 2 times daily.     No current facility-administered medications for this visit.        OBJECTIVE:   BP 135/72 (SITE: left arm, Orthostatic Position: sitting, Cuff Size: regular)   Pulse 67   Ht 1.575 m (5\' 2" )   Wt 77.1 kg (169 lb 15.6 oz)   LMP 05/06/1983   SpO2 97%   BMI 31.09 kg/m      Physical Exam  EXTREMITIES: DP and PT normal b/l. Toes on the right foot not cold, indicating normal perfusion. Mild numbness in all R toes, predominantly in the top part of the foot extending to the thigh. Numbness in specific toes with pain in the calf and ankle on the left side.  MUSCULOSKELETAL: Right hip tender to touch, larger than the left hip.  NEUROLOGICAL: Mild numbness in all toes, predominantly in the top part of the foot extending to the thigh. Numbness in specific toes with pain in the calf and ankle on the left side.    Gen: appears well, NAD  HEENT: EOMI,  sclera clear, anicteric   CV: normal rate, pulses palpable   Chest: trachea midline, non labored breathing, speaking in full sentences   Extrem: no cyanosis or edema   Neuro: no focal deficits   Skin: no rashes or lesions   Psych: normal mood and affect     Results  RADIOLOGY  Lumbar spine CT: Completed  Lumbar spine X-ray: Completed          05/13/2023    10:51 AM   PHQ-2   Interest 0   Feeling 0   PHQ-2  Total Score 0     No question data found.     Depression Treatment Plan:  see assessment/plan section of note    Return if symptoms worsen or fail to improve.    I did review patient's past medical and family/social history, no changes noted.  Barriers to Learning assessed: none. Patient verbalizes understanding of teaching and instructions.  Education Needs Identified?   no    Patient was counseled specifically on the following: diagnostic results, impressions, and/or recommended diagnostic studies reviewed, instructions for management and/or follow-up, risk factor reduction and patient and family education    I spent 20 minutes with the patient, >50% of which were spent counseling her regarding differential, additional testing, if indicated, potential treatment options, and lifestyle modifications, if applicable    Today's visit has complexity inherent to evaluation and management associated with medical care services that serve as the continuing focal point for all needed health care services.    I obtained verbal consent from the patient to use AI ambient technology to transcribe the interactions between the patient and myself during the clinical encounter.    Electronically signed by:  Wyatt Portela, MD  Associate Physician  [facility] - [facility]

## 2023-05-13 NOTE — Nursing Note (Signed)
Vital signs taken, allergies verified, screened for pain, and med history taken.

## 2023-05-14 ENCOUNTER — Other Ambulatory Visit: Payer: Self-pay | Admitting: Student in an Organized Health Care Education/Training Program

## 2023-05-14 ENCOUNTER — Ambulatory Visit: Payer: Medicare Other

## 2023-05-14 MED ORDER — BUPRENORPHINE 15 MCG/HOUR WEEKLY TRANSDERMAL PATCH
1.0000 | MEDICATED_PATCH | TRANSDERMAL | 0 refills | Status: DC
Start: 2023-05-14 — End: 2023-05-19

## 2023-05-14 NOTE — Telephone Encounter (Signed)
 Cures 05/14/23:  Last Name First name Date Filled Drug Name Compound Drug Strength Quantity   Paula Daugherty 05/04/2023 PREGABALIN 150 MG 60   Paula Daugherty 04/20/2023 BUPRENORPHINE 5 MCG 4   Paula Daugherty 04/11/2023 PREGABALIN 100 MG 60   Paula Daugherty 03/25/2023 PREGABALIN 25 MG 49   Paula Daugherty 02/02/2023 TRAMADOL HYDROCHLORIDE 50 MG 6   Paula Daugherty 01/27/2023 TRAMADOL HYDROCHLORIDE 50 MG 14   Paula Daugherty 06/18/2022 OXYCODONE HYDROCHLORIDE 5 MG 5

## 2023-05-14 NOTE — Progress Notes (Signed)
 Clinical Pharmacist Chronic Pain and Opioid Stewardship Telephone Visit Note    Phone call to patient at [phone]. Spoke to patient.     Date of Pharmacist Intake:  04/13/23    Assessment/Plan:     Paula Daugherty presents with chronic pain that is potentially neuropathic in nature. Patient is currently titrating their current pain regimen of Lyrica increased to 150 mg twice daily 05/01/23 and she started buprenorphine (Butrans) 5 mcg/hr patch on 04/30/23. She was opiate naive prior. The patient endorses pain control is  unmanageable/intolerable and is not meeting functional goals. Current OMED  ~21 mg on mixed agonist/antagonist . Patient has the following opioid related risk factors:  concomitant gabapentinoid therapy, age 76 or older, and renal/hepatic insufficiency. The patient may also be experiencing other side effects from current regimen such as  -none per patient  . Last eGFR 03/11/23 46.     Paula Daugherty has uncontrolled nerve pain. She has been slowly titrating up Lyrica as she may have had a reaction in the past to it but is not certain it was from Lyrica. She reports no noticeable relief. Dr. Brent Cambric added duloxetine 20 mg daily yesterday which she has not had a chance to pick up or start yet.     She is working with the pain clinic. Epidural on 04/23/23 afforded no relief. Myelogram is the next step with them. She reports she is not ready to consider surgery.     Maurene increased buprenorphine (Butrans) to 10 mcg/hr 1 week ago and still has no pain relief. No SEs.     Lateia reports taking acetaminophen arthritis 650 mg 3 tabs 4-5  times a day, up to 9,750 mg a day. Considerable over max dose. She reports she cannot take NSAIDS due to decreased renal function.     Recommendations:   - Increase buprenorphine (Butrans) 10 mcg/hr patch to 15 mcg/hr. Pend prescription for PCP consideration.   - Sent My Chart message on how to sign up for HME courses 05/07/23. Recommended Healthy Living with Chronic pain.   - Limit  acetaminophen to max 3,000 mg a day. She is taking 3-4 x max dose. Educated patient on adverse effects.  - Recommend trying OTC topicals to help reduce pain. Recommend Voltaren (diclofenac) gel or lidocaine gels/creams. Patch unlikely to be covered with [employer].   - Extensive opioid education provided with emphasis on patient's specific risks.  - Recommend non pharmacological therapy including: rest, physical therapy/occupational therapy, and mindful pacing of activities.  -She is interested in trying vitamins. Recommended magnesium 200 mg daily OTC. Monitor for diarrhea.     Risk Reduction Interventions:  - Narcan prescribed, education provided to patient and/or caregiver.   - optimization of non-opioid adjuvant therapy  and reduction of APAP to NTE 3g/day    Follow-up on 05/19/23 via Telephone visit.    Paula Daugherty, Pharm. Paula Daugherty, BCPS  Primary Care Clinical Pharmacist  [facility]  [address]   [place], [place] [address]   [phone]       Subjective/Objective:  Paula Daugherty is a 78yr old female referred by Sallyann Crea, MD  for: Consider for buprenorphine? Opioid naive but with chronic pain uncontrolled       Interval History:  05/14/23:  Paula Daugherty has used 2x 5 mcg/hr patches for total dose of 10 mcg/hr for the last 1 week.  -She saw PCP yesterday and duloxetine 20 mg was added. She has not started it yet.  -She  plans to try magnesium.   -She will be trying EMG to assess nerves and Ankle Brachial Index to assess for peripheral artery disease.   -She spoke to pain doctors who recommended myelogram.  -She is not ready to consider surgery.  -She is not sure if PCP was also going to order a xray. She sent her a message.  -She is still taking Lyrica 150 mg twice daily.       Current treatment plan:    Medication Patient Reported SIG Pain Result/ Notes   Lyrica 150 mg Twice daily  Not helping yet, just increased     Acetaminophen 650 mg 3 tabs 4-5 times a day helps   Buprenorphine (Butrans) 10 mcg/hr 1 patch weekly  No relief    Duloxetine 20 mg Daily  Has not started yet       Treatment History:  04/13/23: Lyrica 100 mg twice daily, acetaminophen 6-8 tabs a day (unknown strength)  04/30/23: Start buprenorphine (Butrans) 5 mcg/hr patch.    05/07/23: Increase buprenorphine (Butrans) to 10 mcg/hr  05/13/23: added duloxetine 20 mg daily  05/14/23: Increase buprenorphine (Butrans) to 15 mcg/hr    CURES Reviewed: 05/14/23  Last Name First name Date Filled Drug Name Compound Drug Strength Quantity   Paula Daugherty 05/04/2023 PREGABALIN 150 MG 60   [name] 04/20/2023 BUPRENORPHINE 5 MCG 4   [name] 04/11/2023 PREGABALIN 100 MG 60   [name] 03/25/2023 PREGABALIN 25 MG 49   [name] 02/02/2023 TRAMADOL HYDROCHLORIDE 50 MG 6   [name] 01/27/2023 TRAMADOL HYDROCHLORIDE 50 MG 14   [name] 06/18/2022 OXYCODONE HYDROCHLORIDE 5 MG 5     Current PPA: 05/13/23   Last PCP visit: 05/13/23  Narcan available to patient: Yes, expired No    Pain History 04/13/23:  Patient states reason for referral is discuss pain medications.    Chronic Pain Indication: M51.16 (ICD-10-CM) - Lumbar disc disease with radiculopathy     > Pain:  Reports pain is located in: lower back radiating all the way down her leg.    12/6:  Paula Daugherty is not taking opioids for chronic pain but her pain is uncontrolled and severely limiting ADLs.  -She is taking methylprednisolone from pain clinic but it is not helping so far.  -Pain from ADLs like standing in the shower is excruciating.   -She is titrating up Lyrica. She has visual hallucinations on it in the past. She is not sure if Lyrica was the cause of this and it not having problems so far on it. She is increasing slowly.     Patient describes degenerative disc disease with sciatica. Severe pain starting in low back/hip and extends down to feet and ankles. Shooting tingling burning nerve.     Pain score 7-8/10 on  average, 7/10 on good days, 9/10 on bad days, 7 to 6/10 after medication (Lyrica and acetaminophen barely take the edge off and allows minimal ADLs). Patient reports a tolerable pain score is 2-3/10.     Pt notes pain is most intolerable in the all day/constant.    Relieving factors: PT/OT-just started last week, tried exercises at home and pain increased  Exacerbating factors: movement, prolong standing, bending, and walking  Any walking and any standing. She avoids moving.   Functional goals include: ADLs    > Sleep:   Patient reports tossing and turning. She wakes up in pain and takes acetaminophen. She has to sleep on her right side.     > SE:  Patient denies side effects including- denies side effects currently    > Upcoming procedures: spinal injection possible but she is trying to avoid needing this    > Medications:    Medications tried in the past:     Medication Patient Reported SIG Pain Result    Tramadol   Did not help   Gabapentin      Cyclobenazprine 5 mg  Did not help            Other treatment modalities tried:  physical therapy/occupational therapy     Patient's preferred pharmacy: [facility] on [address] in [place]     Social History 04/13/23:   Alcohol Use: rare- only on cruises, and only 2 drinks in 2 weeks  Cannabis Use: no  Illicit Substances: no  Smoking: no    Medications: Medication reconciliation 04/13/23   Current Medications[1]    Allergies:   Sulfa (Sulfonamide Antibiotics)    Rash  Contrast Dye [Radiopaque Agent]    Hives    Comment:Reports has recently tolerated well  Hydrochlorothiazide    Other-Reaction in Comments    Comment:Patient reports  Januvia [Sitagliptin]    Other-Reaction in Comments    Comment:Patient reports  Keflex [Cephalexin]    Abdominal Pain  Lyrica [Pregabalin]    Other-Reaction in Comments    Comment:Patient reports  Omnipaque [Iohexol]    Other-Reaction in Comments    Comment:Patient reports  Prozac [Fluoxetine Hcl]    Other-Reaction in Comments    Comment:Patient  reports  Topiramate    Other-Reaction in Comments    Comment:Hairfall       PHQ-9:       10/15/2022     8:33 AM 12/25/2022     9:19 AM 05/13/2023    10:51 AM   PHQ-9   Interest 3 3 0   Feeling 2 2 0   Sleeping 1 1    Tired 3 3    Appetite 2 0    Feeling Bad 1 1    Concentrating 0 0    Moving and

## 2023-05-15 ENCOUNTER — Encounter: Payer: Self-pay | Admitting: Student in an Organized Health Care Education/Training Program

## 2023-05-15 DIAGNOSIS — M5442 Lumbago with sciatica, left side: Secondary | ICD-10-CM

## 2023-05-15 MED FILL — lidocaine (PF) 10 mg/mL (1 %) injection solution: INTRAMUSCULAR | Qty: 29 | Status: AC

## 2023-05-15 MED FILL — dexAMETHasone sodium phosphate (PF) 10 mg/mL injection solution: INTRAMUSCULAR | Qty: 1 | Status: AC

## 2023-05-15 MED FILL — lidocaine (PF) 10 mg/mL (1 %) injection solution: INTRAMUSCULAR | Qty: 1 | Status: AC

## 2023-05-15 NOTE — Telephone Encounter (Signed)
 Forwarding to Provider to review and respond to patient.  Ok from PCP to stop plavix prior to upcoming procedure 1/15?

## 2023-05-15 NOTE — Telephone Encounter (Signed)
 Forwarding to Provider to review and respond to patient.  Requests hip x-rays pt had anticipated order from 1/8 OV:     "Hip Pain  Left hip is tender to touch and feels larger to the patient, though not visibly swollen. Pain radiates down the leg and is exacerbated by standing and walking.  -consider X-ray of left hip and possibly SI joint or pelvis based on exam findings.  -continue to follow up with pain management for possible intervention if structural issue identified."

## 2023-05-18 ENCOUNTER — Ambulatory Visit (INDEPENDENT_AMBULATORY_CARE_PROVIDER_SITE_OTHER)
Admission: RE | Admit: 2023-05-18 | Discharge: 2023-05-18 | Disposition: A | Discharge status: Home / Self Care | Payer: Medicare Other | Source: Ambulatory Visit | Attending: Student in an Organized Health Care Education/Training Program | Admitting: Student in an Organized Health Care Education/Training Program

## 2023-05-18 ENCOUNTER — Ambulatory Visit
Admission: RE | Admit: 2023-05-18 | Discharge: 2023-05-18 | Disposition: A | Discharge status: Home / Self Care | Payer: Medicare Other | Source: Ambulatory Visit | Attending: Vascular Surgery | Admitting: Vascular Surgery

## 2023-05-18 ENCOUNTER — Other Ambulatory Visit: Payer: Self-pay | Admitting: Student in an Organized Health Care Education/Training Program

## 2023-05-18 DIAGNOSIS — M5442 Lumbago with sciatica, left side: Secondary | ICD-10-CM | POA: Insufficient documentation

## 2023-05-18 DIAGNOSIS — M160 Bilateral primary osteoarthritis of hip: Secondary | ICD-10-CM

## 2023-05-18 DIAGNOSIS — M4727 Other spondylosis with radiculopathy, lumbosacral region: Secondary | ICD-10-CM

## 2023-05-18 DIAGNOSIS — G8929 Other chronic pain: Secondary | ICD-10-CM

## 2023-05-19 ENCOUNTER — Ambulatory Visit: Payer: Medicare Other

## 2023-05-19 ENCOUNTER — Other Ambulatory Visit: Payer: Self-pay | Admitting: Student in an Organized Health Care Education/Training Program

## 2023-05-19 MED ORDER — BUPRENORPHINE 20 MCG/HOUR WEEKLY TRANSDERMAL PATCH
1.0000 | MEDICATED_PATCH | TRANSDERMAL | 0 refills | Status: DC
Start: 2023-05-19 — End: 2023-06-11

## 2023-05-19 NOTE — Telephone Encounter (Signed)
 Cures 05/19/23:  Last Name First name Date Filled Drug Name Compound Drug Strength Quantity   [name] 05/15/2023 BUPRENORPHINE 15 MCG 4   [name] 05/04/2023 PREGABALIN 150 MG 60   [name] 04/20/2023 BUPRENORPHINE 5 MCG 4   [name] 04/11/2023 PREGABALIN 100 MG 60   [name] 03/25/2023 PREGABALIN 25 MG 49   [name] 02/02/2023 TRAMADOL HYDROCHLORIDE 50 MG 6   [name] 01/27/2023 TRAMADOL HYDROCHLORIDE 50 MG 14   [name] 06/18/2022 OXYCODONE HYDROCHLORIDE 5 MG 5       Madysyn Hanken, Pharm. D., APh, BCPS  Primary Care Clinical Pharmacist  [facility]  [address]   [place], [place] [address]   [phone]

## 2023-05-19 NOTE — Progress Notes (Signed)
 Clinical Pharmacist Chronic Pain and Opioid Stewardship Telephone Visit Note    Phone call to patient at [phone]. Spoke to patient.     Date of Pharmacist Intake:  04/13/23    Assessment/Plan:     Paula Daugherty presents with chronic pain that is potentially neuropathic in nature. Patient is currently titrating their current pain regimen of Lyrica increased to 150 mg twice daily 05/01/23 and she started buprenorphine (Butrans) 5 mcg/hr patch on 04/30/23. She was opiate naive prior. The patient endorses pain control is  unmanageable/intolerable and is not meeting functional goals. Current OMED  ~32 mg on mixed agonist/antagonist . Patient has the following opioid related risk factors:  concomitant gabapentinoid therapy, age 76 or older, and renal/hepatic insufficiency. The patient may also be experiencing other side effects from current regimen such as  -none per patient  . Last eGFR 03/11/23 46.     Paula Daugherty has uncontrolled nerve pain. She has been slowly titrating up Lyrica as she may have had a reaction in the past to it but is not certain it was from Lyrica. She reports no noticeable relief. Dr. Brent Cambric added duloxetine 20 mg daily started 05/15/23 which has not provided any benefit yet.     She is working with the pain clinic. Epidural on 04/23/23 afforded no relief. Myelogram is the next step with them. She reports she is not ready to consider surgery.     Paula Daugherty increased buprenorphine (Butrans) to 15 mcg/hr 5 days ago and still has no pain relief. No SEs.     Paula Daugherty reports taking acetaminophen arthritis 650 mg 3 tabs 4-5  times a day, up to 9,750 mg a day. Considerable over max dose. She reports she cannot take NSAIDS due to decreased renal function.     She started magnesium 250 mg at bedtime and not noticed any relief or SEs yet.     Recommendations:   - Increase buprenorphine (Butrans) 15 mcg/hr patch to 20 mcg/hr. Pend prescription for PCP consideration. OMED ~43 mg.  -Consider rotating to SL buprenorphine 2  mg twice daily (OMED 50 mg) next week if buprenorphine (Butrans) max dose is insufficient.   -My Chart message to patient with further buprenorphine education today.   - Sent My Chart message on how to sign up for HME courses 05/07/23. Recommended Healthy Living with Chronic pain.   - Limit acetaminophen to max 3,000 mg a day. She is taking 3-4 x max dose. Educated patient on adverse effects.  - Recommend trying OTC topicals to help reduce pain. Recommend Voltaren (diclofenac) gel or lidocaine gels/creams. Patch unlikely to be covered with [employer].   - Extensive opioid education provided with emphasis on patient's specific risks.  - Recommend non pharmacological therapy including: rest, physical therapy/occupational therapy, and mindful pacing of activities.  -Continue magnesium 250 mg daily OTC. Monitor for diarrhea.     Risk Reduction Interventions:  - Narcan prescribed, education provided to patient and/or caregiver.   - optimization of non-opioid adjuvant therapy  and reduction of APAP to NTE 3g/day    Follow-up on 05/26/23 via Telephone visit.    Annemarie Barry, Pharm. Branda Cain, BCPS  Primary Care Clinical Pharmacist  [facility]  [address]   [place], [place] [address]   [phone]       Subjective/Objective:  Paula Daugherty is a 78yr old female referred by Sallyann Crea, MD  for: Consider for buprenorphine? Opioid naive but with chronic pain uncontrolled  Interval History:  05/19/23:  Paula Daugherty started duloxetine 20 mg daily 1/10.   -She has been taking buprenorphine (Butrans) 15 mcg/hr since 05/15/23.   -She started magnesium 250 mg. She is taking it at night. She started it at the same time as duloxetine.  -She had her [place] down and there is no blood flow blockage.   -She had hip xrays which were fine.  -She will have EMG on her leg tomorrow.   -She spoke to pain doctors who recommended myelogram.  -She is not ready to  consider surgery.  -She spoke to pain doctors who recommended myelogram.  -She is not ready to consider surgery.  -She is still taking Lyrica 150 mg twice daily.   -She spends most of the day sitting on her couch because it is so painful to walk.       Current treatment plan:    Medication Patient Reported SIG Pain Result/ Notes   Lyrica 150 mg Twice daily  Not helping yet, just increased    Acetaminophen 650 mg 3 tabs 4-5 times a day helps   Buprenorphine (Butrans) 15 mcg/hr 1 patch weekly  No relief    Duloxetine 20 mg Daily  Started 1/10, not noticeable effects             Treatment History:  04/13/23: Lyrica 100 mg twice daily, acetaminophen 6-8 tabs a day (unknown strength)  04/30/23: Start buprenorphine (Butrans) 5 mcg/hr patch, OMED ~11 mg  05/07/23: Increase buprenorphine (Butrans) to 10 mcg/hr, OMED ~21 mg  05/13/23: added duloxetine 20 mg daily  05/14/23: Increase buprenorphine (Butrans) to 15 mcg/hr, OMED ~32 mg  05/19/23: Increase buprenorphine (Butrans) to 20 mcg/hr, OMED ~43 mg    CURES Reviewed: 05/19/23  Last Name First name Date Filled Drug Name Compound Drug Strength Quantity   Paula Daugherty 05/15/2023 BUPRENORPHINE 15 MCG 4   Paula Daugherty 05/04/2023 PREGABALIN 150 MG 60   Paula Daugherty 04/20/2023 BUPRENORPHINE 5 MCG 4   Paula Daugherty 04/11/2023 PREGABALIN 100 MG 60   Paula Daugherty 03/25/2023 PREGABALIN 25 MG 49   Paula Daugherty 02/02/2023 TRAMADOL HYDROCHLORIDE 50 MG 6   Paula Daugherty 01/27/2023 TRAMADOL HYDROCHLORIDE 50 MG 14   Paula Daugherty 06/18/2022 OXYCODONE HYDROCHLORIDE 5 MG 5       Current PPA: 05/13/23   Last PCP visit: 05/13/23, next 05/26/23  Narcan available to patient: Yes, expired No    Pain History 04/13/23:  Patient states reason for referral is discuss pain medications.    Chronic Pain Indication: M51.16 (ICD-10-CM) - Lumbar disc disease with radiculopathy     > Pain:  Reports pain is located in: lower back radiating all the way down her leg.    12/6:  Paula Daugherty is not taking opioids for chronic pain but her  pain is uncontrolled and severely limiting ADLs.  -She is taking methylprednisolone from pain clinic but it is not helping so far.  -Pain from ADLs like standing in the shower is excruciating.   -She is titrating up Lyrica. She has visual hallucinations on it in the past. She is not sure if Lyrica was the cause of this and it not having problems so far on it. She is increasing slowly.     Patient describes degenerative disc disease with sciatica. Severe pain starting in low back/hip and extends down to feet and ankles. Shooting tingling burning nerve.     Pain score 7-8/10 on average, 7/10 on good days, 9/10 on bad days, 7 to 6/10 after medication (  Lyrica and acetaminophen barely take the edge off and allows minimal ADLs). Patient reports a tolerable pain score is 2-3/10.     Pt notes pain is most intolerable in the all day/constant.    Relieving factors: PT/OT-just started last week, tried exercises at home and pain increased  Exacerbating factors: movement, prolong standing, bending, and walking  Any walking and any standing. She avoids moving.   Functional goals include: ADLs    > Sleep:   Patient reports tossing and turning. She wakes up in pain and takes acetaminophen. She has to sleep on her right side.     > SE:   Patient denies side effects including- denies side effects currently    > Upcoming procedures: spinal injection possible but she is trying to avoid needing this    > Medications:    Medications tried in the past:     Medication Patient Reported SIG Pain Result    Tramadol   Did not help   Gabapentin      Cyclobenazprine 5 mg  Did not help            Other treatment modalities tried:  physical therapy/occupational therapy     Patient's preferred pharmacy: [facility] on [address] in [place]     Social History 04/13/23:   Alcohol Use: rare- only on cruises, and only 2 drinks in 2 weeks  Cannabis Use: no  Illicit Substances: no  Smoking: no    Medications: Medication reconciliation 04/13/23   Current  Medications[1]    Allergies:   Sulfa (Sulfonamide Antibiotics)    Rash  Contrast Dye [Radiopaque Agent]    Hives    Comment:Reports has recently tolerated well  Hydrochlorothiazide    Other-Reaction in Comments    Comment:Patient reports  Januvia [Sitagliptin]    Other-Reaction i

## 2023-05-20 ENCOUNTER — Ambulatory Visit: Payer: Medicare Other | Admitting: PHYSICAL THERAPIST

## 2023-05-20 ENCOUNTER — Ambulatory Visit: Payer: Medicare Other | Attending: Neurology

## 2023-05-20 DIAGNOSIS — G629 Polyneuropathy, unspecified: Secondary | ICD-10-CM | POA: Insufficient documentation

## 2023-05-20 DIAGNOSIS — G8929 Other chronic pain: Secondary | ICD-10-CM | POA: Insufficient documentation

## 2023-05-20 DIAGNOSIS — M5417 Radiculopathy, lumbosacral region: Secondary | ICD-10-CM | POA: Insufficient documentation

## 2023-05-20 DIAGNOSIS — G5732 Lesion of lateral popliteal nerve, left lower limb: Secondary | ICD-10-CM | POA: Insufficient documentation

## 2023-05-20 DIAGNOSIS — M5442 Lumbago with sciatica, left side: Secondary | ICD-10-CM | POA: Insufficient documentation

## 2023-05-20 NOTE — Procedures (Addendum)
 [facility]  Department of Neurology  EMG Laboratory        Full Name: Paula Daugherty Gender: Female  Patient ID: [id number] Date of Birth: 31-Jan-1946      Order Date: 05/20/2023 12:55 PM  Age: 78 Years  Examining Physician: Merideth Abbey, MD PGY-5   Technician: Rogelia Rohrer, R.NCS.T   Patient History: 78 yr old female with longstanding history of diabetes presents with constant back and left hip pain with radiating pain down the left lateral leg and foot, worse with walking. Weakness in the left leg. Denies symptoms on the right.  Exam:  tenderness to palpation on the sacroiliac joint.   Motor: 5/5 BLE  Sensory: proprioception diminished great toes b/l otherwise intact to other modalities of sensation in BLE.   DTR: 2+ b/l knees, 1+ ankles b/l, plantar responses downgoing b/l      Motor Nerve Conduction Study       Nerve / Sites Muscle Latency Amp.P-P Dur. Segments Dist. Lat Diff Velocity Temp. Comments     ms mV ms  mm ms m/s C    L Peroneal - EDB      Ankle EDB 4.81 1.5 5.04 Ankle - EDB 80   31.3       Fib Head EDB 11.75 1.0 5.06 Fib Head - Ankle 270 6.94 39 31.2       Pop fossa EDB 13.77 1.0 5.77 Pop fossa - Fib Head 110 2.02 54 31.1    L Tibial - AH      Ankle AH 4.58 10.3 5.13 Ankle - AH 80   31.3       Knee AH 12.75 7.8 6.08 Knee - Ankle 340 8.17 42 31.3            Sensory Nerve Conduction Study       Nerve / Sites Rec. Site Onset Lat Peak Lat NP Amp PP Amp Segments Distance Velocity Temp.     ms ms V V  mm m/s C   L Sural - Ankle (Calf)      Calf Ankle 3.44 4.29 3.6 5.7 Calf - Ankle 140 41 31.2   L Superficial peroneal - Ankle      Lat leg Ankle 2.90 3.79 4.2 4.0 Lat leg - Ankle 140 48 31           F Waves       Nerve F Latency M Latency F - M Lat    ms ms ms   L Tibial - AH 50.9 4.9 46.0         H Reflex       Nerve M Lat. H Lat. H Amp pp    ms ms mV    Left Right Left Right Left Right   Tibial - Soleus 5.42 5.83 31.41 31.61 2.0 1.6           EMG Summary Table     Spontaneous MUAP  Recruitment   Muscle Nerve Roots Insertional Fib PSW Fasc H.F. Amp Dur. Poly Pattern   L. Tibialis anterior Deep peroneal (Fibular) L4-L5 N None Few None None N N N N   L. Gastrocnemius (Medial head) Tibial S1-S2 N None None None None N N N N   L. Vastus medialis Femoral L2-L4 N None None None None N N Fragmented N   L. Tensor fasciae latae Superior gluteal L4-S1 N 2+ 2+ None None N N Fragmented N   L. Gluteus maximus Inferior gluteal L5-S2 N  2+ 2+ None None N N Fragmented N   L. L5 paraspinal Spinal L5- N None 1+ None Run PSW N Incr Few N   L. L4 paraspinal Spinal L4- N None 1+ None None N N N N   R. L5 paraspinal Spinal L5- N None None None None N N N N                                           ELECTRODIAGNOSTIC FINDINGS:     Reduced compound muscle action potential (CMAP) amplitude of the left peroneal nerve with normal motor conduction velocities. Normal motor conduction studies of the left tibial nerve.   Reduced sensory nerve action potential (SNAP) amplitudes of the left sural and superficial peroneal nerves with normal sensory conduction velocities.   Normal F wave latencies in the left tibial nerve.  Normal H wave latencies in the bilateral tibial nerves with diminished amplitudes.   Needle EMG study of the selected muscles showed active on chronic denervation with positive sharp waves, fibrillation potentials and decreased recruitment pattern in the left TFL and Gluteus maximus muscles. Also chronic denervation with decreased recruitment pattern in the left L4 innervated vastus medialis muscle. Active on chronic denervation also appreciated in the left L5 lumbar paraspinal muscles. Chronic denervation in the left L5 lumbar paraspinal muscle.    CLINICAL IMPRESSION:     Abnormal study. In combination with patients history, exam and electrodiagnostic studies, there is evidence of:  Acute on chronic left L4-5 lumbar radiculopathy.   Left peroneal mononeuropathy (mild).  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     Electronically signed by:  Merideth Abbey, MD PGY-5   Clinical Neurophysiology EMG Fellow       I have personally reviewed the study and the fellow's interpretation, and I agree with the clinical impression and recommendations.    Ricardo A. Andi Hence, MD  Clinical Neurophysiology Attending

## 2023-05-20 NOTE — Progress Notes (Signed)
 [facility] - Technologist Note    Patient:  Paula Daugherty  MRN:   [id number]  Date of Birth:  12/01/1945  Location:  Outpatient  Study Type:  NCS 5-6 Studies, EMG Complete +NCS x1, and EMG Non-limb +NCS x1    Notes:   N/A    The patient and/or caregiver received education regarding their procedure. The tech answered appropriate questions. Any critical findings will be routed to the EMG medical director.      Jammal Sarr, R.NCS.T  [facility]  [facility]  P: [phone]

## 2023-05-26 ENCOUNTER — Other Ambulatory Visit: Payer: Self-pay | Admitting: Student in an Organized Health Care Education/Training Program

## 2023-05-26 ENCOUNTER — Ambulatory Visit (INDEPENDENT_AMBULATORY_CARE_PROVIDER_SITE_OTHER): Payer: Medicare Other

## 2023-05-26 ENCOUNTER — Ambulatory Visit: Payer: Medicare Other | Admitting: Student in an Organized Health Care Education/Training Program

## 2023-05-26 DIAGNOSIS — G8929 Other chronic pain: Secondary | ICD-10-CM

## 2023-05-26 DIAGNOSIS — M25572 Pain in left ankle and joints of left foot: Secondary | ICD-10-CM

## 2023-05-26 DIAGNOSIS — G4710 Hypersomnia, unspecified: Secondary | ICD-10-CM

## 2023-05-26 DIAGNOSIS — M5116 Intervertebral disc disorders with radiculopathy, lumbar region: Secondary | ICD-10-CM

## 2023-05-26 MED ORDER — BUPRENORPHINE HCL 2 MG SUBLINGUAL TABLET
2.0000 mg | SUBLINGUAL_TABLET | Freq: Two times a day (BID) | SUBLINGUAL | 0 refills | Status: AC
Start: 2023-05-26 — End: 2023-06-25

## 2023-05-26 MED ORDER — PREGABALIN 25 MG CAPSULE
ORAL_CAPSULE | ORAL | 0 refills | Status: DC
Start: 2023-05-26 — End: 2023-08-07

## 2023-05-26 MED ORDER — PREGABALIN 75 MG CAPSULE
75.0000 mg | ORAL_CAPSULE | Freq: Two times a day (BID) | ORAL | 0 refills | Status: DC
Start: 2023-05-26 — End: 2023-08-07

## 2023-05-26 MED ORDER — PREGABALIN 50 MG CAPSULE
50.0000 mg | ORAL_CAPSULE | Freq: Two times a day (BID) | ORAL | 0 refills | Status: DC
Start: 2023-05-26 — End: 2023-08-07

## 2023-05-26 MED ORDER — PREGABALIN 100 MG CAPSULE
100.0000 mg | ORAL_CAPSULE | Freq: Two times a day (BID) | ORAL | 0 refills | Status: DC
Start: 2023-05-26 — End: 2023-06-11

## 2023-05-26 NOTE — Telephone Encounter (Signed)
Cures 05/26/23:    Last Name First name Date Filled Drug Name Compound Drug Strength Quantity   Tatlock Cindia 05/15/2023 BUPRENORPHINE 15 MCG 4   Joles Aniza 05/04/2023 PREGABALIN 150 MG 60   [place] Jessika 04/20/2023 BUPRENORPHINE 5 MCG 4   Trulock Cadee 04/11/2023 PREGABALIN 100 MG 60   Stiver Darline 03/25/2023 PREGABALIN 25 MG 49   Heatley Kieley 02/02/2023 TRAMADOL HYDROCHLORIDE 50 MG 6   Funari Katherina 01/27/2023 TRAMADOL HYDROCHLORIDE 50 MG 14   Ricotta Brendalee 06/18/2022 OXYCODONE HYDROCHLORIDE 5 MG 5

## 2023-05-26 NOTE — Progress Notes (Signed)
Video Visit Note    I performed this clinical encounter by utilizing a real time telehealth video connection between my location and the patient's location. I obtained consent from the patient to perform this clinical encounter utilizing video and prepared the patient by answering any questions they had about the telehealth interaction.     Patient Location:  Other (Home)      Video call face-to-face discussion time: 70m 45s      I spent a total of 30 minutes today on this patient's visit.    ASSESSMENT & PLAN      Lumbar disc disease with radiculopathy  (primary encounter diagnosis)  Chronic pain of left ankle    Assessment & Plan  Chronic Ankle Pain  Lumbar disc disease with radiculopathy   Persistent pain despite current management. EMG confirmed mild left perineal mononeuropathy and lumbar pathology. No relief from the pain patch. Patient reports severe pain at the ankle, similar to a previous experience of a broken ankle.  -Order ankle X-ray to rule out bone lesion or severe arthritis.  -Consider steroid injection in the ankle if arthritis is present.  -Continue Cymbalta, plan to increase dose at next visit in February.  -Taper off Lyrica over the next five weeks due to lack of efficacy and potential contribution to patient's excessive sleepiness.  -Discuss potential for sublingual pain management with pharmacist Kenney Houseman.  -Consider CT myelogram for more detailed imaging, discuss with patient further once she has had time to consider.  -Follow up with Dr. Lulu Riding regarding EMG results and potential for another targeted epidural injection.  -Schedule follow-up appointment in February.    Excessive Sleepiness  New symptom since starting Cymbalta, could also be a side effect of the Lyrica.  -Taper off Lyrica over the next five weeks.  -Consider reducing Cymbalta dose if sleepiness persists after Lyrica is discontinued.    General Health Maintenance / Followup Plans  -Follow up with pharmacist Kenney Houseman regarding potential  for sublingual pain management.  -Schedule follow-up appointment in February.  -Remind patient to bring up ankle pain at next visit for further examination.       Modified Medications    Modified Medication Previous Medication    PREGABALIN (LYRICA) 100 MG CAPSULE Pregabalin (LYRICA) 150 mg capsule       Take 1 capsule by mouth 2 times daily for 7 days.    Take 1 capsule by mouth 2 times daily.        SUBJECTIVE      Paula Daugherty is a 78yr old female.    History of Present Illness  Paula Daugherty, a 78 year old female, presents with chronic leg pain that has not improved despite various treatments. The patient reports a sensation of something dragging in her leg, which has significantly impacted her quality of life, confining her to the couch for most of the day. She has been using a pain patch and taking medications, including Cymbalta and Lyrica, with no relief. The patient also reports excessive sleepiness, which she believes may be a side effect of the Cymbalta. An EMG study confirmed a left perineal mononeuropathy, which could be contributing to the leg pain. The patient also reports intense pain in her ankle, comparing the sensation to a broken ankle. She has not had an x-ray of the ankle to date. The patient expresses anxiety about the possibility of a CT myelogram due to a fear of pain and a needle in her spine.       OBJECTIVE  Her vitals were not taken for this visit.       Physical Exam         Gen: appears well, NAD  Chest: non labored breathing, speaking in full sentences   Psych: normal mood and affect     Results  DIAGNOSTIC  EMG: Mild left peroneal mononeuropathy       I obtained verbal consent from the patient to use AI ambient technology to transcribe the interactions between the patient and myself during the clinical encounter.    Today's visit has complexity inherent to evaluation and management associated with medical care services that serve as the continuing focal point for all needed health  care services.      Electronically signed by:  Wyatt Portela, MD  Associate Physician  [facility]

## 2023-05-26 NOTE — Progress Notes (Signed)
Clinical Pharmacist Chronic Pain and Opioid Stewardship Telephone Visit Note    Phone call to patient at [phone]. Spoke to patient.     Date of Pharmacist Intake:  04/13/23    Assessment/Plan:     Paula Daugherty presents with chronic pain that is potentially neuropathic in nature. Patient is currently looking to optimize their current pain regimen of buprenorphine (Butrans) 20 mcg/hr patch. Lyrica 150 mg twice daily is being  tapered down as of 05/26/23. She has started duloxetine 20 mg daily 05/15/23 with plans to titrate the dose but she has been feeling sleepy on both medications. She started buprenorphine (Butrans) 5 mcg/hr patch on 04/30/23. She was opiate naive prior. The patient endorses pain control is  unmanageable/intolerable and is not meeting functional goals. Current OMED  ~43 mg on mixed agonist/antagonist . Patient has the following opioid related risk factors:  concomitant gabapentinoid therapy, age 76 or older, and renal/hepatic insufficiency. The patient may also be experiencing other side effects from current regimen such as  -none per patient  . Last eGFR 03/11/23 46.     Paula Daugherty has uncontrolled nerve pain. She has been slowly titrating up pregabalin. She started duloxetine 05/15/23 as well and is sleeping most of the day. She does not feel a benefit from preabalin and will start to taper off after seeing her PCP earlier today. She will see more pain relief with duloxetine at 60 mg daily. Concern for over sedation currently.     She is working with the pain clinic. Epidural on 04/23/23 afforded no relief. Myelogram is the next step with them. She reports she is not ready to consider surgery.     Paula Daugherty increased buprenorphine (Butrans) to 20 mcg/hr 5 days ago and still has no pain relief. No SEs.     Paula Daugherty reports taking acetaminophen arthritis 650 mg 3 tabs 4-5  times a day, up to 9,750 mg a day. Considerable over max dose. She reports she cannot take NSAIDS due to decreased renal function.     She  started magnesium 250 mg at bedtime and not noticed any relief or SEs yet.     Recommendations:   - Transition buprenorphine (Butrans) 20 mcg/hr patch (OMED ~43 mg) to sublingual buprenorphine 2 mg twice daily. Pend prescription for PCP consideration. OMED ~50 mg.  - My Chart message to patient with further SL buprenorphine education today. General information sent also 05/19/23.   - Sent My Chart message on how to sign up for HME courses 05/07/23. Recommended Healthy Living with Chronic pain.   - Limit acetaminophen to max 3,000 mg a day. She is taking 3-4 x max dose. Educated patient on adverse effects.  - Recommend trying OTC topicals to help reduce pain. Recommend Voltaren (diclofenac) gel or lidocaine gels/creams. Patch unlikely to be covered with [employer].   - Extensive opioid education provided with emphasis on patient's specific risks.  - Recommend non pharmacological therapy including: rest, physical therapy/occupational therapy, and mindful pacing of activities.  -Continue magnesium 250 mg daily OTC. Monitor for diarrhea.     Risk Reduction Interventions:  - Narcan prescribed, education provided to patient and/or caregiver.   - optimization of non-opioid adjuvant therapy  and reduction of APAP to NTE 3g/day    Follow-up on 05/31/74 via Telephone visit.    Paula Daugherty, Pharm. D., APh, BCPS  Primary Care Clinical Pharmacist  [facility]  [address]   [place], [place] [address]   [phone]  Subjective/Objective:  Paula Daugherty is a 78yr old female referred by Paula Bigness, MD  for: Consider for buprenorphine? Opioid naive but with chronic pain uncontrolled       Interval History:  05/26/23:  Paula Daugherty met with Paula. Delford Field today.  -She increased to buprenorphine (Butrans) 20 mcg/hr last week and still has no pain relief.   -Xray showed arthritis but she does not think this is the cause of her pain. She is having an xray  on her ankle next.   -EMG showed some neuropathy.   -She will start titrating off pregabalin. She is sleeping most of the day and feels it is not helping. PCP gave her instructions today for taper.   -She is considering increasing duloxetine in February after decreasing pregabalin if she is not feeling so sleepy. She has less appetite since starting the duloxetine.   -She is ready to rotate to buprenorphine SL. Educated on use.       Current treatment plan:    Medication Patient Reported SIG Pain Result/ Notes   Lyrica 150 mg Twice daily  Not helping    Acetaminophen 650 mg 3 tabs 4-5 times a day helps   Buprenorphine (Butrans) 20 mcg/hr 1 patch weekly  No relief    Duloxetine 20 mg Daily  Started 1/10, not noticeable effects             Treatment History:  04/13/23: Lyrica 100 mg twice daily, acetaminophen 6-8 tabs a day (unknown strength)  04/30/23: Start buprenorphine (Butrans) 5 mcg/hr patch, OMED ~11 mg  05/07/23: Increase buprenorphine (Butrans) to 10 mcg/hr, OMED ~21 mg  05/13/23: added duloxetine 20 mg daily  05/14/23: Increase buprenorphine (Butrans) to 15 mcg/hr, OMED ~32 mg  05/19/23: Increase buprenorphine (Butrans) to 20 mcg/hr, OMED ~43 mg  05/26/23: tapering off pregabalin, rotate buprenorphine (Butrans) to SL buprenorphine 2 mg twice daily, OMED 50 mg    CURES Reviewed: 05/26/23      Current PPA: 05/13/23   Last PCP visit: 05/26/23, next 06/25/23  Narcan available to patient: Yes, expired No    Pain History 04/13/23:  Patient states reason for referral is discuss pain medications.    Chronic Pain Indication: M51.16 (ICD-10-CM) - Lumbar disc disease with radiculopathy     > Pain:  Reports pain is located in: lower back radiating all the way down her leg.    12/6:  Paula Daugherty is not taking opioids for chronic pain but her pain is uncontrolled and severely limiting ADLs.  -She is taking methylprednisolone from pain clinic but it is not helping so far.  -Pain from ADLs like standing in the shower is excruciating.   -She is  titrating up Lyrica. She has visual hallucinations on it in the past. She is not sure if Lyrica was the cause of this and it not having problems so far on it. She is increasing slowly.     Patient describes degenerative disc disease with sciatica. Severe pain starting in Daugherty back/hip and extends down to feet and ankles. Shooting tingling burning nerve.     Pain score 7-8/10 on average, 7/10 on good days, 9/10 on bad days, 7 to 6/10 after medication (Lyrica and acetaminophen barely take the edge off and allows minimal ADLs). Patient reports a tolerable pain score is 2-3/10.     Pt notes pain is most intolerable in the all day/constant.    Relieving factors: PT/OT-just started last week, tried exercises at home and pain increased  Exacerbating factors: movement, prolong  standing, bending, and walking  Any walking and any standing. She avoids moving.   Functional goals include: ADLs    > Sleep:   Patient reports tossing and turning. She wakes up in pain and takes acetaminophen. She has to sleep on her right side.     > SE:   Patient denies side effects including- denies side effects currently    > Upcoming procedures: spinal injection possible but she is trying to avoid needing this    > Medications:    Medications tried in the past:     Medication Patient Reported SIG Pain Result    Tramadol   Did not help   Gabapentin      Cyclobenazprine 5 mg  Did not help            Other treatment modalities tried:  physical therapy/occupational therapy     Patient's preferred pharmacy: [facility] on [address] in [place]     Social History 04/13/23:   Alcohol Use: rare- only on cruises, and only 2 drinks in 2 weeks  Cannabis Use: no  Illicit Substances: no  Smoking: no    Medications: Medication reconciliation 04/13/23   Current Medications[1]    Allergies:   Sulfa (Sulfonamide Antibiotics)    Rash  Contrast Dye [Radiopaque Agent]    Hives    Comment:Reports has recently tolerated well  Hydrochlorothiazide    Other-Reaction in  Comments    Comment:Patient reports  Januvia [Sitagliptin]    Other-Reaction in Comments    Comment:Patient reports  Keflex [Cephalexin]    Abdominal Pain  Lyrica [Pregabalin]    Other-Reaction in Comments    Comment:Patient reports  Omnipaque [Iohexol]    Other-Reaction in Comments    Comment:Patient reports  Prozac [Fluoxetine Hcl]    Other-Reaction in Comments    Comment:Patient reports  Topiramate    Other-Reaction in Comments    Comment:Hairfall       PHQ-9:       10/15/2022     8:33 AM 12/25/2022     9:19 AM 05/13/2023    10:51 AM   PHQ-9   Interest 3 3 0   Feeling 2 2 0   Sleeping 1 1    Tired 3 3    Appetite 2 0    Feeling Bad 1 1    Concentrating 0 0    Moving and Speaking 0 0    Suicidal Thoughts 0 0    PHQ-9 Total 12 10    PHQ-9 Affective Total 6 6    PHQ-9 Physical Total 6 4    PHQ-9 Cognitive Total 0 0           Total Score Depression Severity   1-4 Minimal Depression   5-9 Mild Depression   10-14 Moderate Depression   15-19 Moderately Severe Depression   20-27 Severe Depression        GAD-7:       06/01/2019    10:15 AM 08/31/2019    10:15 AM 12/10/2021    11:17 AM   GAD7 Results   Feeling nervous, anxious or on edge? 1-Several Days 1-Several Days    Not being able to stop or control worrying? 2-More than half the days 1-Several days    Worrying too much about different things? 2-More than half the days 1-Several days    Trouble relaxing? 2-More than half the days 0-Not at all    Being so restless that it is hard to sit still? 0-Not at all 0-Not  at all    Becoming easily annoyed or irritable? 2-More than half the days 1-Several days    Feeling afraid as if something awful might happen? 0-Not at all 0-Not at all    For office coding: Total Score 9 4    Date questionnaire was completed 06/01/2019 08/31/2019    Name of Provider documenting questionnaire Paula [name] Daugherty    Feeling Nervous, Anxious, or on Edge   1   Not Being Able to Stop or Control Worrying   1   Worrying too Much About Different Things   1    Trouble Relaxing   0   Being so Restless That it is Hard to Sit Still   0   Becoming Easily Annoyed or Irritable   2   Feeling Afraid as if Something Awful Might Happen   0   GAD-7 Total Score   5   If you checked off any problems, how difficult have these problems made it for you to do your work, take care of things at home, or get along with other people?   Somewhat difficult         Total Score Anxiety Severity   1-4 Minimal Anxiety   5-9 Mild Anxiety   10-14 Moderate Anxiety   15-21 Severe Anxiety        Last UTOX:   No results found for: "BARBSSCRNU", "BENZOSSCRNU", "COCAINESCRNU", "OPIATESSCRU", "AMPHETSCRNU", "DRUGGCSCRNU"    Opiates Quant/Confirm,Urine:   No results found for: "CODEINE", "MORPHINE", "HYDROCODONE", "HYDROMORPH", "DIHYDROCOD", "NORHYDROCOD", "OXYCODONE", "OXYMORPHONE", "NOROXYCODONE", "6AM"    Medical notes are meant to convey information between members of our health care team. Therefore these notes require medical terminology for precision and efficiency.   Abbreviations may also be used. Choice of words might differ from common language due to nuances of meaning specific to healthcare.  If you have questions or concerns about this note, please schedule an appointment and we can discuss any concerns at your convenience. Alteration of the medical record cannot be guaranteed.            [1]   Current Outpatient Medications   Medication Sig Dispense Refill    Albuterol (PROAIR HFA, PROVENTIL HFA, VENTOLIN HFA) 90 mcg/actuation inhaler INHALE 1 TO 2 PUFFS BY MOUTH EVERY 6 HOURS AS NEEDED FOR WHEEZING 8.5 g 0    Amlodipine (NORVASC) 10 mg Tablet Take 1 tablet by mouth every day. 90 tablet 3    Atorvastatin (LIPITOR) 20 mg Tablet Take 1 tablet by mouth every day at bedtime. 90 tablet 3    Blood Glucose Meter (ONETOUCH ULTRA2 METER) Kit 1 kit one time. 1 kit 0    Blood Sugar Diagnostic (ONETOUCH ULTRA TEST) Strips Use to test blood glucose 2x/day 100 strip 11    Buprenorphine (BUTRANS) 20  mcg/hour Patch Apply 1 patch to the skin one time each week. Patient is stopping buprenorphine (Butrans) 15 mcg/hr patch and increasing to 20 mcg/hr. Please fill this new strength ASAP as her pain is uncontrolled. She will only be using one strength at a time. Thanks! 4 patch 0    CloNIDine (CATAPRES) 0.1 mg Tablet Take 2 tablets by mouth every day at bedtime. Indications: "change of life" signs 180 tablet 3    Clopidogrel (PLAVIX) 75 mg Tablet Take 1 tablet by mouth every morning. 90 tablet 3    Duloxetine (CYMBALTA) 20 mg Delayed Release Capsule Take 1 capsule by mouth every day. 30 capsule 11    Hydrocortisone (ANUSOL-HC) 25  mg Suppository Insert 1 suppository into the rectum every 12 hours. 12 suppository 0    Losartan (COZAAR) 100 mg tablet Take 1 tablet by mouth every day. 90 tablet 3    Metformin (GLUCOPHAGE) 1,000 mg tablet Take 1 tablet by mouth once daily with a meal. 90 tablet 3    Metoclopramide (REGLAN) 5 mg Tablet Take 1 tablet by mouth once daily if needed (for nausea).      Naloxone (NARCAN) 4 mg/actuation Nasal Spray Use only when an opioid overdose emergency is suspected.  Spray into one nostril upon signs of opioid overdose. Call 911.   Additional doses may be given every 2 to 3 minutes until emergency medical assistance arrives.  Alternate nostrils with each dose.  Use each nasal spray only one time. 1 each 1    Ondansetron (ZOFRAN-ODT) 8 mg disintegrating tablet Take 1 tablet by mouth every 8 hours if needed. 100 tablet 0    Pregabalin (LYRICA) 100 mg capsule Take 1 capsule by mouth 2 times daily for 7 days. 14 capsule 0    Pregabalin (LYRICA) 25 mg Capsule Take 1 capsule by mouth 2 times daily for 7 days, THEN 1 capsule every day at bedtime for 7 days. 21 capsule 0    Pregabalin (LYRICA) 50 mg Capsule Take 1 capsule by mouth 2 times daily for 7 days. 14 capsule 0    Pregabalin (LYRICA) 75 mg capsule Take 1 capsule by mouth 2 times daily for 7 days. 14 capsule 0    Triamcinolone (KENALOG) 0.1 %  Cream Apply to the affected area 2 times daily. 30 g 1     No current facility-administered medications for this visit.

## 2023-05-26 NOTE — Patient Instructions (Signed)
Thank you for letting me participate in your care.  It was very nice to see you today!    Please go to the pharmacy to pick up your medication. Take as prescribed   If you are checking into lab or x-ray please check into the FRONT DESK first.    If you are not getting your labs done today, you can come in any time during business hours and walk into lab. X-Ray on site also takes walk ins.    IMPORTANT THINGS TO KNOW:   If you had a referral placed today, please give it 10 to 14 business days before calling about the referral as that is the down time it takes for the insurance and referral department to go through the process.    Mychart messages, results, and prescription refills may take up to 72 hours for a response.   Thank you for your patience.    Please call if you have any questions or concerns. Our phone number is [phone]    Follow up as discussed.     FYI:  A new federal rule requires test results to be released to you immediately, even if your provider has not yet had a chance to review and interpret them for you. Your care team may need up to about 3 business days to review your results and explain them, if needed. We appreciate your understanding.     Dr. Deliana Avalos  Family Medicine Physician   [facility] [place]

## 2023-05-27 ENCOUNTER — Ambulatory Visit
Admission: RE | Admit: 2023-05-27 | Discharge: 2023-05-27 | Disposition: A | Discharge status: Home / Self Care | Payer: Medicare Other | Source: Ambulatory Visit | Attending: Student in an Organized Health Care Education/Training Program | Admitting: Student in an Organized Health Care Education/Training Program

## 2023-05-27 DIAGNOSIS — M7732 Calcaneal spur, left foot: Secondary | ICD-10-CM

## 2023-05-27 DIAGNOSIS — G8929 Other chronic pain: Secondary | ICD-10-CM

## 2023-05-27 DIAGNOSIS — M25572 Pain in left ankle and joints of left foot: Secondary | ICD-10-CM

## 2023-05-29 ENCOUNTER — Encounter: Payer: Self-pay | Admitting: Student in an Organized Health Care Education/Training Program

## 2023-05-29 NOTE — Telephone Encounter (Signed)
Clinical please contact pharmacy.Patient gets pregabalin 150mg  twice daily from [facility] already Rph is trying to clarify if the new prescriptions for Pregabalin 100mg , 75mg , 50mg  and 25mg  dated 05/26/2023 are an addition to the current dose. Is the patient taking both?

## 2023-05-29 NOTE — Telephone Encounter (Signed)
[employer] and spoke with Verlon Au. Verlon Au will process the prescription for Pregabalin 100mg  and get it ready.    No further action.

## 2023-05-29 NOTE — Telephone Encounter (Signed)
Refill Request  Forwarding to Clinical staff to address. Lyrica      [MA:  Create an Encounter and use .MCMEDREFILL]

## 2023-06-01 ENCOUNTER — Ambulatory Visit: Payer: Medicare Other

## 2023-06-01 ENCOUNTER — Telehealth: Payer: Self-pay

## 2023-06-01 NOTE — Telephone Encounter (Signed)
Advice       Pt is calling requesting a call back from pharmacist  Tanya.   Pt states she has questions about medications       Please call at [phone]         Roxanne Gates, PSR II  Specialty Scheduling Unit [facility])  Patient Contact Center   Ph: [phone]

## 2023-06-01 NOTE — Progress Notes (Signed)
Clinical Pharmacist Chronic Pain and Opioid Stewardship Telephone Visit Note    Phone call to patient at [phone]. Spoke to patient.     Date of Pharmacist Intake:  04/13/23    Assessment/Plan:     [name] presents with chronic pain that is potentially neuropathic in nature. Patient is currently looking to optimize their current pain regimen of buprenorphine (Butrans) 20 mcg/hr patch. Lyrica 150 mg twice daily is being  tapered down as of 05/26/23. She has started duloxetine 20 mg daily 05/15/23 with plans to titrate the dose but she has been feeling sleepy on both medications. She started buprenorphine (Butrans) 5 mcg/hr patch on 04/30/23. She was opiate naive prior. The patient endorses pain control is  unmanageable/intolerable and is not meeting functional goals. Current OMED  ~43 mg on mixed agonist/antagonist . Patient has the following opioid related risk factors:  concomitant gabapentinoid therapy, age 76 or older, and renal/hepatic insufficiency. The patient may also be experiencing other side effects from current regimen such as  -none per patient  . Last eGFR 03/11/23 46.     Imunique has uncontrolled nerve pain. She has been slowly titrating up pregabalin. She started duloxetine 05/15/23 as well and is sleeping most of the day. She does not feel a benefit from preabalin and will start to taper off after seeing her PCP earlier today. She will see more pain relief with duloxetine at 60 mg daily. Concern for over sedation currently.     She is working with the pain clinic. Epidural on 04/23/23 afforded no relief. Myelogram is the next step with them. She reports she is not ready to consider surgery.     Jayliana increased buprenorphine (Butrans) to 20 mcg/hr 5 days ago and still has no pain relief. No SEs.     Rhylin reports taking acetaminophen arthritis 650 mg 3 tabs 4-5  times a day, up to 9,750 mg a day. Considerable over max dose. She reports she cannot take NSAIDS due to decreased renal function.     She  started magnesium 250 mg at bedtime and not noticed any relief or SEs yet.     Recommendations:   - Continue lower dose buprenorphine 1mg  BID (OMED 25); patient with symptoms of over treatment on buprenorphine likely still coming off of 2mg  BID taken for 2 days.  Will continue lower dose in anticipation of improvement of symptoms  - STart ATC acetaminophen ER  - 1300mg  qa.m, 650mg  mid day, and 650mg  qhs  - STart ATC diclofenact gel BID        - Sent My Chart message on how to sign up for HME courses 05/07/23. Recommended Healthy Living with Chronic pain.   - Limit acetaminophen to max 3,000 mg a day. She is taking 3-4 x max dose. Educated patient on adverse effects.  - Recommend trying OTC topicals to help reduce pain. Recommend Voltaren (diclofenac) gel or lidocaine gels/creams. Patch unlikely to be covered with [employer].   - Extensive opioid education provided with emphasis on patient's specific risks.  - Recommend non pharmacological therapy including: rest, physical therapy/occupational therapy, and mindful pacing of activities.  -Continue magnesium 250 mg daily OTC. Monitor for diarrhea.     Risk Reduction Interventions:  - Narcan prescribed, education provided to patient and/or caregiver.   - optimization of non-opioid adjuvant therapy  and reduction of APAP to NTE 3g/day    Follow-up on 06/05/23 via Telephone visit.    [name], [name], BCPS, BCGP, BCACP  Pharmacist, [place]  [facility] [phone]  [facility] [phone]  [facility] [phone]        Subjective/Objective:  Paula Daugherty is a 78yr old female referred by Garth Bigness, MD  for: Consider for buprenorphine? Opioid naive but with chronic pain uncontrolled     Interval History: 06/01/23    Chart review:  Taper of pregabalin initiated with goal qweek decrease 100-->75-->50-->25 BID each week, currently 100mg  BID  Buprenorphine CURES shows  sold #60 tablets 2mg  on 05/27/23    Pt reports:  Took patch off on Wednesday 1/21, Started SL buprenorphine on Thurs 1/22  By Saturday 1/23 had a lot of difficulty with coordination and balance, couldn't hold anything in her hand.  Was sleeping a lot  Went to [facility] pharmacist who recommended she lower dose to 1/2 tablet BID (1mg  BID) which she did  Pain slightly better but not by much, still some issues with coordination and balance today though may improved a little bit  Notes stopped using acetaminophen as there were so many changes to her medication regimen didn't want to add another thing    05/26/23:    -She increased to buprenorphine (Butrans) 20 mcg/hr last week and still has no pain relief.   -Xray showed arthritis but she does not think this is the cause of her pain. She is having an xray on her ankle next.   -EMG showed some neuropathy.   -She will start titrating off pregabalin. She is sleeping most of the day and feels it is not helping. PCP gave her instructions today for taper.   -She is considering increasing duloxetine in February after decreasing pregabalin if she is not feeling so sleepy. She has less appetite since starting the duloxetine.   -She is ready to rotate to buprenorphine SL. Educated on use.       Current treatment plan:    Medication Patient Reported SIG Pain Result/ Notes   Lyrica 150 mg Twice daily  Not helping    Acetaminophen 650 mg 3 tabs 4-5 times a day helps   Buprenorphine (Butrans) 20 mcg/hr 1 patch weekly  No relief    Duloxetine 20 mg Daily  Started 1/10, not noticeable effects             Treatment History:  04/13/23: Lyrica 100 mg twice daily, acetaminophen 6-8 tabs a day (unknown strength)  04/30/23: Start buprenorphine (Butrans) 5 mcg/hr patch, OMED ~11 mg  05/07/23: Increase buprenorphine (Butrans) to 10 mcg/hr, OMED ~21 mg  05/13/23: added duloxetine 20 mg daily  05/14/23: Increase buprenorphine (Butrans) to 15 mcg/hr, OMED ~32 mg  05/19/23: Increase buprenorphine (Butrans)  to 20 mcg/hr, OMED ~43 mg  05/26/23: tapering off pregabalin, rotate buprenorphine (Butrans) to SL buprenorphine 2 mg twice daily, OMED 50 mg    CURES Reviewed: 06/01/23      Current PPA: 05/13/23   Last PCP visit: 05/26/23, next 06/25/23  Narcan available to patient: Yes, expired No    Pain History 04/13/23:  Patient states reason for referral is discuss pain medications.    Chronic Pain Indication: M51.16 (ICD-10-CM) - Lumbar disc disease with radiculopathy     > Pain:  Reports pain is located in: lower back radiating all the way down her leg.    12/6:  [name] is not taking opioids for chronic pain but her pain is uncontrolled and severely limiting ADLs.  -She is taking methylprednisolone from pain clinic but it is not helping so far.  -Pain  from ADLs like standing in the shower is excruciating.   -She is titrating up Lyrica. She has visual hallucinations on it in the past. She is not sure if Lyrica was the cause of this and it not having problems so far on it. She is increasing slowly.     Patient describes degenerative disc disease with sciatica. Severe pain starting in low back/hip and extends down to feet and ankles. Shooting tingling burning nerve.     Pain score 7-8/10 on average, 7/10 on good days, 9/10 on bad days, 7 to 6/10 after medication (Lyrica and acetaminophen barely take the edge off and allows minimal ADLs). Patient reports a tolerable pain score is 2-3/10.     Pt notes pain is most intolerable in the all day/constant.    Relieving factors: PT/OT-just started last week, tried exercises at home and pain increased  Exacerbating factors: movement, prolong standing, bending, and walking  Any walking and any standing. She avoids moving.   Functional goals include: ADLs    > Sleep:   Patient reports tossing and turning. She wakes up in pain and takes acetaminophen. She has to sleep on her right side.     > SE:   Patient denies side effects including- denies side effects currently    > Upcoming procedures:  spinal injection possible but she is trying to avoid needing this    > Medications:    Medications tried in the past:     Medication Patient Reported SIG Pain Result    Tramadol   Did not help   Gabapentin      Cyclobenazprine 5 mg  Did not help            Other treatment modalities tried:  physical therapy/occupational therapy     Patient's preferred pharmacy: [facility] on [address] in [place]     Social History 04/13/23:   Alcohol Use: rare- only on cruises, and only 2 drinks in 2 weeks  Cannabis Use: no  Illicit Substances: no  Smoking: no    Medications: Medication reconciliation 04/13/23   Current Medications[1]    Allergies:   Sulfa (Sulfonamide Antibiotics)    Rash  Contrast Dye [Radiopaque Agent]    Hives    Comment:Reports has recently tolerated well  Hydrochlorothiazide    Other-Reaction in Comments    Comment:Patient reports  Januvia [Sitagliptin]    Other-Reaction in Comments    Comment:Patient reports  Keflex [Cephalexin]    Abdominal Pain  Lyrica [Pregabalin]    Other-Reaction in Comments    Comment:Patient reports  Omnipaque [Iohexol]    Other-Reaction in Comments    Comment:Patient reports  Prozac [Fluoxetine Hcl]    Other-Reaction in Comments    Comment:Patient reports  Topiramate    Other-Reaction in Comments    Comment:Hairfall       PHQ-9:       10/15/2022     8:33 AM 12/25/2022     9:19 AM 05/13/2023    10:51 AM   PHQ-9   Interest 3 3 0   Feeling 2 2 0   Sleeping 1 1    Tired 3 3    Appetite 2 0    Feeling Bad 1 1    Concentrating 0 0    Moving and Speaking 0 0    Suicidal Thoughts 0 0    PHQ-9 Total 12 10    PHQ-9 Affective Total 6 6    PHQ-9 Physical Total 6 4    PHQ-9 Cognitive Total  0 0           Total Score Depression Severity   1-4 Minimal Depression   5-9 Mild Depression   10-14 Moderate Depression   15-19 Moderately Severe Depression   20-27 Severe Depression        GAD-7:       06/01/2019    10:15 AM 08/31/2019    10:15 AM 12/10/2021    11:17 AM   GAD7 Results   Feeling nervous, anxious or on edge?  1-Several Days 1-Several Days    Not being able to stop or control worrying? 2-More than half the days 1-Several days    Worrying too much about different things? 2-More than half the days 1-Several days    Trouble relaxing? 2-More than half the days 0-Not at all    Being so restless that it is hard to sit still? 0-Not at all 0-Not at all    Becoming easily annoyed or irritable? 2-More than half the days 1-Several days    Feeling afraid as if something awful might happen? 0-Not at all 0-Not at all    For office coding: Total Score 9 4    Date questionnaire was completed 06/01/2019 08/31/2019    Name of Provider documenting questionnaire Dr Grier Rocher Dr Grier Rocher    Feeling Nervous, Anxious, or on Edge   1   Not Being Able to Stop or Control Worrying   1   Worrying too Much About Different Things   1   Trouble Relaxing   0   Being so Restless That it is Hard to Sit Still   0   Becoming Easily Annoyed or Irritable   2   Feeling Afraid as if Something Awful Might Happen   0   GAD-7 Total Score   5   If you checked off any problems, how difficult have these problems made it for you to do your work, take care of things at home, or get along with other people?   Somewhat difficult         Total Score Anxiety Severity   1-4 Minimal Anxiety   5-9 Mild Anxiety   10-14 Moderate Anxiety   15-21 Severe Anxiety        Last UTOX:   No results found for: "BARBSSCRNU", "BENZOSSCRNU", "COCAINESCRNU", "OPIATESSCRU", "AMPHETSCRNU", "DRUGGCSCRNU"    Opiates Quant/Confirm,Urine:   No results found for: "CODEINE", "MORPHINE", "HYDROCODONE", "HYDROMORPH", "DIHYDROCOD", "NORHYDROCOD", "OXYCODONE", "OXYMORPHONE", "NOROXYCODONE", "6AM"    Medical notes are meant to convey information between members of our health care team. Therefore these notes require medical terminology for precision and efficiency.   Abbreviations may also be used. Choice of words might differ from common language due to nuances of meaning specific to healthcare.  If you have  questions or concerns about this note, please schedule an appointment and we can discuss any concerns at your convenience. Alteration of the medical record cannot be guaranteed.            [1]   Current Outpatient Medications   Medication Sig Dispense Refill    Albuterol (PROAIR HFA, PROVENTIL HFA, VENTOLIN HFA) 90 mcg/actuation inhaler INHALE 1 TO 2 PUFFS BY MOUTH EVERY 6 HOURS AS NEEDED FOR WHEEZING 8.5 g 0    Amlodipine (NORVASC) 10 mg Tablet Take 1 tablet by mouth every day. 90 tablet 3    Atorvastatin (LIPITOR) 20 mg Tablet Take 1 tablet by mouth every day at bedtime. 90 tablet 3    Blood Glucose Meter (ONETOUCH  ULTRA2 METER) Kit 1 kit one time. 1 kit 0    Blood Sugar Diagnostic (ONETOUCH ULTRA TEST) Strips Use to test blood glucose 2x/day 100 strip 11    Buprenorphine (BUTRANS) 20 mcg/hour Patch Apply 1 patch to the skin one time each week. Patient is stopping buprenorphine (Butrans) 15 mcg/hr patch and increasing to 20 mcg/hr. Please fill this new strength ASAP as her pain is uncontrolled. She will only be using one strength at a time. Thanks! 4 patch 0    Buprenorphine (SUBUTEX) 2 mg Sublingual Tablet Dissolve 1 tablet under the tongue 2 times daily. 60 tablet 0    CloNIDine (CATAPRES) 0.1 mg Tablet Take 2 tablets by mouth every day at bedtime. Indications: "change of life" signs 180 tablet 3    Clopidogrel (PLAVIX) 75 mg Tablet Take 1 tablet by mouth every morning. 90 tablet 3    Duloxetine (CYMBALTA) 20 mg Delayed Release Capsule Take 1 capsule by mouth every day. 30 capsule 11    Hydrocortisone (ANUSOL-HC) 25 mg Suppository Insert 1 suppository into the rectum every 12 hours. 12 suppository 0    Losartan (COZAAR) 100 mg tablet Take 1 tablet by mouth every day. 90 tablet 3    Metformin (GLUCOPHAGE) 1,000 mg tablet Take 1 tablet by mouth once daily with a meal. 90 tablet 3    Metoclopramide (REGLAN) 5 mg Tablet Take 1 tablet by mouth once daily if needed (for nausea).      Naloxone (NARCAN) 4 mg/actuation  Nasal Spray Use only when an opioid overdose emergency is suspected.  Spray into one nostril upon signs of opioid overdose. Call 911.   Additional doses may be given every 2 to 3 minutes until emergency medical assistance arrives.  Alternate nostrils with each dose.  Use each nasal spray only one time. 1 each 1    Ondansetron (ZOFRAN-ODT) 8 mg disintegrating tablet Take 1 tablet by mouth every 8 hours if needed. 100 tablet 0    Pregabalin (LYRICA) 100 mg capsule Take 1 capsule by mouth 2 times daily for 7 days. 14 capsule 0    Pregabalin (LYRICA) 25 mg Capsule Take 1 capsule by mouth 2 times daily for 7 days, THEN 1 capsule every day at bedtime for 7 days. 21 capsule 0    Pregabalin (LYRICA) 50 mg Capsule Take 1 capsule by mouth 2 times daily for 7 days. 14 capsule 0    Pregabalin (LYRICA) 75 mg capsule Take 1 capsule by mouth 2 times daily for 7 days. 14 capsule 0    Triamcinolone (KENALOG) 0.1 % Cream Apply to the affected area 2 times daily. 30 g 1     No current facility-administered medications for this visit.

## 2023-06-02 NOTE — Telephone Encounter (Signed)
Situation  Returning phone call to patient at [phone].     Background  She meet with pharmacist [name], pharmD yesterday.     Assessment  She is having SEs (sedation and balance issues) on buprenorphine (Subutex) 2 mg twice daily and decreased to 1 mg twice daily 05/30/23. She started ATC acetaminophen. She slept most of the day after speaking to [facility]. She hasn't done much today to assess how she is feeling.     Recommendation  Catherine will continue with buprenorphine 1 mg twice daily and ATC acetaminophen this week and follow up on 1/31. Discussed increasing duloxetine at next PCP visit. Optimize topicals. Can consider gabapentin if she tapers off pregabalin. Can consider buprenorphine (Belbuca) if SEs do not resolve with SL buprenorphine.       Hollace Hayward, Pharm. D., APh, BCPS  Primary Care Clinical Pharmacist  [facility]  [address]   [place], [place] [address]   [phone]

## 2023-06-05 ENCOUNTER — Ambulatory Visit: Payer: Medicare Other

## 2023-06-05 NOTE — Progress Notes (Signed)
Clinical Pharmacist Chronic Pain and Opioid Stewardship Telephone Visit Note    Phone call to patient at [phone]. Spoke to patient.     Date of Pharmacist Intake:  04/13/23    Assessment/Plan:     Paula Daugherty presents with chronic pain that is potentially neuropathic in nature. Patient is currently looking to optimize their current pain regimen of buprenorphine (Butrans) 20 mcg/hr patch. Lyrica 150 mg twice daily is being  tapered down as of 05/26/23. Paula Daugherty has started duloxetine 20 mg daily 05/15/23 with plans to titrate the dose but Paula Daugherty has been feeling sleepy on both medications. Paula Daugherty started buprenorphine (Butrans) 5 mcg/hr patch on 04/30/23. Paula Daugherty was opiate naive prior. The patient endorses pain control is  unmanageable/intolerable and is not meeting functional goals. Current OMED  ~43 mg on mixed agonist/antagonist . Patient has the following opioid related risk factors:  concomitant gabapentinoid therapy, age 76 or older, and renal/hepatic insufficiency. The patient may also be experiencing other side effects from current regimen such as  -none per patient  . Last eGFR 03/11/23 46.     Paula Daugherty has uncontrolled nerve pain. Paula Daugherty has been slowly titrating up pregabalin. Paula Daugherty started duloxetine 05/15/23 as well and is sleeping most of the day. Paula Daugherty does not feel a benefit from preabalin and will start to taper off after seeing her PCP earlier today. Paula Daugherty will see more pain relief with duloxetine at 60 mg daily. Concern for over sedation currently.     Paula Daugherty is working with the pain clinic. Epidural on 04/23/23 afforded no relief. Myelogram is the next step with them. Paula Daugherty reports Paula Daugherty is not ready to consider surgery.     Paula Daugherty increased buprenorphine (Butrans) to 20 mcg/hr 5 days ago and still has no pain relief. No SEs.     Paula Daugherty reports taking acetaminophen arthritis 650 mg 3 tabs 4-5  times a day, up to 9,750 mg a day. Considerable over max dose. Paula Daugherty reports Paula Daugherty cannot take NSAIDS due to decreased renal function.     Paula Daugherty  started magnesium 250 mg at bedtime and not noticed any relief or SEs yet.     Recommendations:   - Continue lower dose buprenorphine 1mg  BID (OMED 25); Pt can trial lowering evening dose further to 1/4 tablet = 0.5mg  (continue 1mg  q.am) to see if oversleeping improves without change in pain control  Noted patient is currently decreasing pregablin (75mg  BID on 2/1) which may also cause somnolence  .  If pain worsens, ok to go back up to 1mg  BID  - Increase ATC acetaminophen ER  - 1300mg  qa.m, 1300mg  mid day, and 650mg  qhs  - Increase ATC diclofenact gel TID.  Rx provided.  If covered by insurance, will be lower cost    - Extensive opioid education provided with emphasis on patient's specific risks.  - Recommend non pharmacological therapy including: rest, physical therapy/occupational therapy, and mindful pacing of activities.    For constipation  - Increase miralax to BID  - Increase fluid intake to at least 64oz  - Increase magnesium citrate to 500mg  qhs (currently 250mg  qhs)    Risk Reduction Interventions:  - Narcan prescribed, education provided to patient and/or caregiver.   - optimization of non-opioid adjuvant therapy  and reduction of APAP to NTE 3g/day    Follow-up on 1 week via Telephone visit.    Paula Daugherty, Mason.D, BCPS, BCGP, BCACP  Pharmacist, [place] [phone]  [facility] [phone]  [facility]  [phone]        Subjective/Objective:  Paula Daugherty is a 78yr old female referred by Garth Bigness, MD  for: Consider for buprenorphine? Opioid naive but with chronic pain uncontrolled     Interval History: 06/01/23    Chart review:  Taper of pregabalin initiated with goal qweek decrease 100-->75-->50-->25 BID each week, currently 100mg  BID  Buprenorphine CURES shows sold #60 tablets 2mg  on 05/27/23    Pt reports:    Feels pain may be slightly better  Usually wakes up with a 9 pain  level but right now around a 7  Confirms ATC acetaminophen and diclofenac  Worries Paula Daugherty is sleeping too much.  This a.m work up at 1030a, went to sleep around 10p.  Usually wakes up around 7a.  Seems to be sleeping more and more which Paula Daugherty doesn't like  Has to nap about 3 hours each afternoon.  Yesterday didn't sleep as much as Paula Daugherty was "fighting" the grogginess as they had errands to run    05/26/23:    -Paula Daugherty increased to buprenorphine (Butrans) 20 mcg/hr last week and still has no pain relief.   -Xray showed arthritis but Paula Daugherty does not think this is the cause of her pain. Paula Daugherty is having an xray on her ankle next.   -EMG showed some neuropathy.   -Paula Daugherty will start titrating off pregabalin. Paula Daugherty is sleeping most of the day and feels it is not helping. PCP gave her instructions today for taper.   -Paula Daugherty is considering increasing duloxetine in February after decreasing pregabalin if Paula Daugherty is not feeling so sleepy. Paula Daugherty has less appetite since starting the duloxetine.   -Paula Daugherty is ready to rotate to buprenorphine SL. Educated on use.       Current treatment plan:    Medication Patient Reported SIG Pain Result/ Notes   Lyrica 150 mg Twice daily  Not helping    Acetaminophen 650 mg 3 tabs 4-5 times a day helps   Buprenorphine (Butrans) 20 mcg/hr 1 patch weekly  No relief    Duloxetine 20 mg Daily  Started 1/10, not noticeable effects             Treatment History:  04/13/23: Lyrica 100 mg twice daily, acetaminophen 6-8 tabs a day (unknown strength)  04/30/23: Start buprenorphine (Butrans) 5 mcg/hr patch, OMED ~11 mg  05/07/23: Increase buprenorphine (Butrans) to 10 mcg/hr, OMED ~21 mg  05/13/23: added duloxetine 20 mg daily  05/14/23: Increase buprenorphine (Butrans) to 15 mcg/hr, OMED ~32 mg  05/19/23: Increase buprenorphine (Butrans) to 20 mcg/hr, OMED ~43 mg  05/26/23: tapering off pregabalin, rotate buprenorphine (Butrans) to SL buprenorphine 2 mg twice daily, OMED 50 mg    CURES Reviewed: 06/01/23      Current PPA: 05/13/23   Last PCP visit:  05/26/23, next 06/25/23  Narcan available to patient: Yes, expired No    Pain History 04/13/23:  Patient states reason for referral is discuss pain medications.    Chronic Pain Indication: M51.16 (ICD-10-CM) - Lumbar disc disease with radiculopathy     > Pain:  Reports pain is located in: lower back radiating all the way down her leg.    12/6:  Paula Daugherty is not taking opioids for chronic pain but her pain is uncontrolled and severely limiting ADLs.  -Paula Daugherty is taking methylprednisolone from pain clinic but it is not helping so far.  -Pain from ADLs like standing in the shower is excruciating.   -Paula Daugherty is titrating up Lyrica. Paula Daugherty has visual hallucinations  on it in the past. Paula Daugherty is not sure if Lyrica was the cause of this and it not having problems so far on it. Paula Daugherty is increasing slowly.     Patient describes degenerative disc disease with sciatica. Severe pain starting in Daugherty back/hip and extends down to feet and ankles. Shooting tingling burning nerve.     Pain score 7-8/10 on average, 7/10 on good days, 9/10 on bad days, 7 to 6/10 after medication (Lyrica and acetaminophen barely take the edge off and allows minimal ADLs). Patient reports a tolerable pain score is 2-3/10.     Pt notes pain is most intolerable in the all day/constant.    Relieving factors: PT/OT-just started last week, tried exercises at home and pain increased  Exacerbating factors: movement, prolong standing, bending, and walking  Any walking and any standing. Paula Daugherty avoids moving.   Functional goals include: ADLs    > Sleep:   Patient reports tossing and turning. Paula Daugherty wakes up in pain and takes acetaminophen. Paula Daugherty has to sleep on her right side.     > SE:   Patient denies side effects including- denies side effects currently    > Upcoming procedures: spinal injection possible but Paula Daugherty is trying to avoid needing this    > Medications:    Medications tried in the past:     Medication Patient Reported SIG Pain Result    Tramadol   Did not help   Gabapentin       Cyclobenazprine 5 mg  Did not help            Other treatment modalities tried:  physical therapy/occupational therapy     Patient's preferred pharmacy: [facility] on [address] in [place]     Social History 04/13/23:   Alcohol Use: rare- only on cruises, and only 2 drinks in 2 weeks  Cannabis Use: no  Illicit Substances: no  Smoking: no    Medications: Medication reconciliation 04/13/23   Current Medications[1]    Allergies:   Sulfa (Sulfonamide Antibiotics)    Rash  Contrast Dye [Radiopaque Agent]    Hives    Comment:Reports has recently tolerated well  Hydrochlorothiazide    Other-Reaction in Comments    Comment:Patient reports  Januvia [Sitagliptin]    Other-Reaction in Comments    Comment:Patient reports  Keflex [Cephalexin]    Abdominal Pain  Lyrica [Pregabalin]    Other-Reaction in Comments    Comment:Patient reports  Omnipaque [Iohexol]    Other-Reaction in Comments    Comment:Patient reports  Prozac [Fluoxetine Hcl]    Other-Reaction in Comments    Comment:Patient reports  Topiramate    Other-Reaction in Comments    Comment:Hairfall       PHQ-9:       10/15/2022     8:33 AM 12/25/2022     9:19 AM 05/13/2023    10:51 AM   PHQ-9   Interest 3 3 0   Feeling 2 2 0   Sleeping 1 1    Tired 3 3    Appetite 2 0    Feeling Bad 1 1    Concentrating 0 0    Moving and Speaking 0 0    Suicidal Thoughts 0 0    PHQ-9 Total 12 10    PHQ-9 Affective Total 6 6    PHQ-9 Physical Total 6 4    PHQ-9 Cognitive Total 0 0           Total Score Depression Severity   1-4 Minimal  Depression   5-9 Mild Depression   10-14 Moderate Depression   15-19 Moderately Severe Depression   20-27 Severe Depression        GAD-7:       06/01/2019    10:15 AM 08/31/2019    10:15 AM 12/10/2021    11:17 AM   GAD7 Results   Feeling nervous, anxious or on edge? 1-Several Days 1-Several Days    Not being able to stop or control worrying? 2-More than half the days 1-Several days    Worrying too much about different things? 2-More than half the days 1-Several days     Trouble relaxing? 2-More than half the days 0-Not at all    Being so restless that it is hard to sit still? 0-Not at all 0-Not at all    Becoming easily annoyed or irritable? 2-More than half the days 1-Several days    Feeling afraid as if something awful might happen? 0-Not at all 0-Not at all    For office coding: Total Score 9 4    Date questionnaire was completed 06/01/2019 08/31/2019    Name of Provider documenting questionnaire Dr Grier Rocher Dr Grier Rocher    Feeling Nervous, Anxious, or on Edge   1   Not Being Able to Stop or Control Worrying   1   Worrying too Much About Different Things   1   Trouble Relaxing   0   Being so Restless That it is Hard to Sit Still   0   Becoming Easily Annoyed or Irritable   2   Feeling Afraid as if Something Awful Might Happen   0   GAD-7 Total Score   5   If you checked off any problems, how difficult have these problems made it for you to do your work, take care of things at home, or get along with other people?   Somewhat difficult         Total Score Anxiety Severity   1-4 Minimal Anxiety   5-9 Mild Anxiety   10-14 Moderate Anxiety   15-21 Severe Anxiety        Last UTOX:   No results found for: "BARBSSCRNU", "BENZOSSCRNU", "COCAINESCRNU", "OPIATESSCRU", "AMPHETSCRNU", "DRUGGCSCRNU"    Opiates Quant/Confirm,Urine:   No results found for: "CODEINE", "MORPHINE", "HYDROCODONE", "HYDROMORPH", "DIHYDROCOD", "NORHYDROCOD", "OXYCODONE", "OXYMORPHONE", "NOROXYCODONE", "6AM"    Medical notes are meant to convey information between members of our health care team. Therefore these notes require medical terminology for precision and efficiency.   Abbreviations may also be used. Choice of words might differ from common language due to nuances of meaning specific to healthcare.  If you have questions or concerns about this note, please schedule an appointment and we can discuss any concerns at your convenience. Alteration of the medical record cannot be guaranteed.            [1]   Current  Outpatient Medications   Medication Sig Dispense Refill    Albuterol (PROAIR HFA, PROVENTIL HFA, VENTOLIN HFA) 90 mcg/actuation inhaler INHALE 1 TO 2 PUFFS BY MOUTH EVERY 6 HOURS AS NEEDED FOR WHEEZING 8.5 g 0    Amlodipine (NORVASC) 10 mg Tablet Take 1 tablet by mouth every day. 90 tablet 3    Atorvastatin (LIPITOR) 20 mg Tablet Take 1 tablet by mouth every day at bedtime. 90 tablet 3    Blood Glucose Meter (ONETOUCH ULTRA2 METER) Kit 1 kit one time. 1 kit 0    Blood Sugar Diagnostic (ONETOUCH ULTRA TEST) Strips  Use to test blood glucose 2x/day 100 strip 11    Buprenorphine (BUTRANS) 20 mcg/hour Patch Apply 1 patch to the skin one time each week. Patient is stopping buprenorphine (Butrans) 15 mcg/hr patch and increasing to 20 mcg/hr. Please fill this new strength ASAP as her pain is uncontrolled. Paula Daugherty will only be using one strength at a time. Thanks! 4 patch 0    Buprenorphine (SUBUTEX) 2 mg Sublingual Tablet Dissolve 1 tablet under the tongue 2 times daily. 60 tablet 0    CloNIDine (CATAPRES) 0.1 mg Tablet Take 2 tablets by mouth every day at bedtime. Indications: "change of life" signs 180 tablet 3    Clopidogrel (PLAVIX) 75 mg Tablet Take 1 tablet by mouth every morning. 90 tablet 3    Duloxetine (CYMBALTA) 20 mg Delayed Release Capsule Take 1 capsule by mouth every day. 30 capsule 11    Hydrocortisone (ANUSOL-HC) 25 mg Suppository Insert 1 suppository into the rectum every 12 hours. 12 suppository 0    Losartan (COZAAR) 100 mg tablet Take 1 tablet by mouth every day. 90 tablet 3    Metformin (GLUCOPHAGE) 1,000 mg tablet Take 1 tablet by mouth once daily with a meal. 90 tablet 3    Metoclopramide (REGLAN) 5 mg Tablet Take 1 tablet by mouth once daily if needed (for nausea).      Naloxone (NARCAN) 4 mg/actuation Nasal Spray Use only when an opioid overdose emergency is suspected.  Spray into one nostril upon signs of opioid overdose. Call 911.   Additional doses may be given every 2 to 3 minutes until emergency  medical assistance arrives.  Alternate nostrils with each dose.  Use each nasal spray only one time. 1 each 1    Ondansetron (ZOFRAN-ODT) 8 mg disintegrating tablet Take 1 tablet by mouth every 8 hours if needed. 100 tablet 0    Pregabalin (LYRICA) 100 mg capsule Take 1 capsule by mouth 2 times daily for 7 days. 14 capsule 0    Pregabalin (LYRICA) 25 mg Capsule Take 1 capsule by mouth 2 times daily for 7 days, THEN 1 capsule every day at bedtime for 7 days. 21 capsule 0    Pregabalin (LYRICA) 50 mg Capsule Take 1 capsule by mouth 2 times daily for 7 days. 14 capsule 0    Pregabalin (LYRICA) 75 mg capsule Take 1 capsule by mouth 2 times daily for 7 days. 14 capsule 0    Triamcinolone (KENALOG) 0.1 % Cream Apply to the affected area 2 times daily. 30 g 1     No current facility-administered medications for this visit.

## 2023-06-09 ENCOUNTER — Telehealth: Payer: Self-pay | Admitting: PHYSICIAN ASSISTANT

## 2023-06-09 NOTE — Telephone Encounter (Signed)
IR CT/myelogram L-spine    Called and spoke with patient in regards to scheduling the myelogram. Patient would like to hold off for now and will call when ready to schedule. Patient was given the IR Scheduling phone number and asked to call when she is ready to schedule.         Protocolled on 05/11/2023  2:16 PM by Shon Hale, RN       CT/myelogram L-spine.        IR room 1629. (Weight 167 lbs)        Needs CBC w/o diff, BMP, INR/ PTT within 30 days of procedure.        Patient on Plavix, please hold for 5 days if approved by prescribing MD. Please route to Garth Bigness, MD        Contrast allergy? (please let NeuroIR Coordinator know ASAP).        Driver needed the day of the procedure.    2-3 hours fasting recommended prior to this procedure, but encourage good hydration day before and the morning of the procedure.    Patient to arrive 30 minutes before the procedure scheduled time.    IR RN will call the patient with detailed pre-procedure instructions 1-2 days before the procedure scheduled date.

## 2023-06-11 ENCOUNTER — Other Ambulatory Visit: Payer: Self-pay | Admitting: Student in an Organized Health Care Education/Training Program

## 2023-06-11 ENCOUNTER — Ambulatory Visit: Payer: Medicare Other

## 2023-06-11 MED ORDER — DICLOFENAC 1 % TOPICAL GEL: Freq: Four times a day (QID) | TOPICAL | 1 refills | Status: AC

## 2023-06-11 NOTE — Progress Notes (Signed)
Clinical Pharmacist Chronic Pain and Opioid Stewardship Telephone Visit Note    Phone call to patient at [phone]. Spoke to patient.     Date of Pharmacist Intake:  04/13/23    Assessment/Plan:     Paula Daugherty presents with chronic pain that is potentially neuropathic in nature. Patient is currently looking to optimize their current pain regimen of buprenorphine (Subutex) 2 mg tabs, 1/2 tab in the AM and 1/4 tab at bedtime. (OMED ~19 mg) Lyrica 150 mg twice daily is being tapered down weekly as of 05/26/23. She is using 75 mg twice daily now. She has started duloxetine 20 mg daily 05/15/23 with plans to titrate up the dose but she has been feeling sleepy on both medications. She started buprenorphine (Butrans) patch on 04/30/23. Max dose ineffective. She was opiate naive prior. The patient endorses pain control is  improving since last visit and is not meeting functional goals. Current OMED  ~19 mg on mixed agonist/antagonist . Patient has the following opioid related risk factors:  concomitant gabapentinoid therapy, age 76 or older, and renal/hepatic insufficiency. The patient may also be experiencing other side effects from current regimen such as confusion/mental cloudiness and drowsiness. Last eGFR 03/11/23 46.     Tayja has uncontrolled nerve pain. She was slowly titrating up pregabalin but has SEs and is now tapering off. She started duloxetine 20 mg daily 05/15/23 and is sleeping most of the day. She does not feel a benefit from pregabalin. She will see more pain relief with duloxetine at 60 mg daily. Concern for over sedation currently.     She is working with the pain clinic. Epidural on 04/23/23 afforded no relief. Myelogram is the next step with them. She reports she is not ready to consider surgery.     Dinesha rotated to buprenorphine SL 2 mg twice daily but this made her too drowsy so she has been decreasing the dose. She does get some pain relief from it so we are balancing SEs (also SEs from other  medications) and efficacy. She has been taking 1/2 tab in the AM and 1/4 in the PM and did well for 3 days with no naps but the last 2 days she is sleeping more again so she started with 1/4 tab twice daily this morning.     Judene reports taking acetaminophen arthritis 650 mg 2 tabs three times daily, up to 3,900 mg a day. Discussed limiting duration of this dose and trying to keep the dose under 3,000 mg daily. She reports she cannot take NSAIDS due to decreased renal function. She is trying Voltaren gel but no great relief from it.     She is taking magnesium 250 mg 2 tabs at bedtime. She thinks she is sleeping better.  Constipation has resolved.       Recommendations:   - Continue the lower dose of buprenorphine she started today of 1/4 tab or 0.5 mg BID (OMED 12.5 mg) to see if oversleeping improves without change in pain control. DC buprenorphine (Butrans) off medication list.   -Decrease pregablin 75 mg BID to 50 mg twice daily on 2/8 which may also cause somnolence  - Continue ATC acetaminophen ER  - 1,300 mg qa.m, 1,300 mg mid day, and 1,300 mg at bedtime, TDD 3,900 mg. Decrease under 3 gm when able.   - Continue ATC diclofenact gel TID.  Pend prescription for PCP.  If covered by insurance, will be lower cost.   - Continue magnesium 500 mg at  bedtime   - She will follow up with Dr. Delford Field 2/20 and consider increasing duloxetine to 40 mg daily at that time. She will start keeping a log of SEs.     - Extensive opioid education provided with emphasis on patient's specific risks.  - Recommend non pharmacological therapy including: rest, physical therapy/occupational therapy, and mindful pacing of activities.    Risk Reduction Interventions:  - Narcan prescribed, education provided to patient and/or caregiver.   - optimization of non-opioid adjuvant therapy  and reduction of APAP to NTE 3g/day    Follow-up on 1 week via Telephone visit.      Hollace Hayward, Pharm. Orson Eva, BCPS  Primary Care Clinical  Pharmacist  [facility]  [address]   [place], [place] [address]   [phone]          Subjective/Objective:  Paula Daugherty is a 78yr old female referred by Garth Bigness, MD  for: Consider for buprenorphine? Opioid naive but with chronic pain uncontrolled     Interval History: 06/11/23  [name] feels her pain is improving. The worst pain is in her ankle and calf.   -She had an [place] and EMG and thinks her ankle pain may be separate from the hip and sciatic pain.   -She had 3 days that she didn't take any naps, but the last two days she slept all day. She feels she is sleeping too much.  -She has been taking buprenorphine (Subutex) 1/2 tab (1 mg) in the AM and 1/4 (0.5 mg) at bedtime. Today she decided to try 1/4 tab twice daily.  -She reports having conversations in her sleep but she feels she is not really asleep but does not realize she is talking to people who aren't there. She feels she is more out of it and doing weird things when she is awake and feeling like she is sleeping. Her husband report she is more restless, twitchy and jumpy in her sleep.   -She feels she is more unsteady on her feet. She has one hand that trembles when gripping sometimes and is worried about tardive dyskinesia.   -She is titrating down pregabalin. She is on 75 mg twice daily until this Saturday then she will decrease to 50 mg twice daily.   -She is taking acetaminophen ER 650 mg 2 in the AM, 2 midday, 2 at bedtime. (Taking 3,900 mg TDD)  -She is using magnesium 250 mg x 2 tabs at bedtime. She thinks she may be sleeping better.   -She is trying diclofenac gel on her calf. She has not noticed much relief. She would like a prescription for it to see if her insurance will pay for it.   -She took more Miralax, fluid and magnesium citrate 500 mg at bedtime for a short time for her constipation and once she got relief she decreased these back to normal  now that she got relief.   -She is considering increasing duloxetine in February after decreasing pregabalin if she is not feeling so sleepy.     Current treatment plan:    Medication Patient Reported SIG Pain Result/ Notes   Lyrica 75 mg Twice daily  Not helping - tapering off   Acetaminophen 650 mg 2 tabs 3 times a day Helps, TDD 3,900 mg   Buprenorphine (Subutex) 2 mg tabs 1/4 tab twice daily  some relief    Duloxetine 20 mg Daily  Started 1/10, not noticeable effects    Voltaren  gel  Not sure if helping             Treatment History:  04/13/23: Lyrica 100 mg twice daily, acetaminophen 6-8 tabs a day (unknown strength)  04/30/23: Start buprenorphine (Butrans) 5 mcg/hr patch, OMED ~11 mg  05/07/23: Increase buprenorphine (Butrans) to 10 mcg/hr, OMED ~21 mg  05/13/23: added duloxetine 20 mg daily  05/14/23: Increase buprenorphine (Butrans) to 15 mcg/hr, OMED ~32 mg  05/19/23: Increase buprenorphine (Butrans) to 20 mcg/hr, OMED ~43 mg  05/26/23: tapering off pregabalin, rotate buprenorphine (Butrans) to SL buprenorphine 2 mg twice daily, OMED 50 mg  06/01/23: buprenorphine SL decreased to 1 mg twice daily (OMED 25 mg)  06/05/23: buprenorphine 1 mg QAM, 0.5 mg QPM (OMED ~19 mg)  06/11/23: pregabalin 75 mg twice daily, decreasing to 50 mg on 2/8. Decrease buprenorphine to 0.5 mg twice daily (OMED 12.5 mg)    CURES Reviewed: 06/01/23      Current PPA: 05/13/23   Last PCP visit: 05/26/23, next 06/25/23  Narcan available to patient: Yes, expired No    Pain History 04/13/23:  Patient states reason for referral is discuss pain medications.    Chronic Pain Indication: M51.16 (ICD-10-CM) - Lumbar disc disease with radiculopathy     > Pain:  Reports pain is located in: lower back radiating all the way down her leg.    12/6:  [name] is not taking opioids for chronic pain but her pain is uncontrolled and severely limiting ADLs.  -She is taking methylprednisolone from pain clinic but it is not helping so far.  -Pain from ADLs like standing in the  shower is excruciating.   -She is titrating up Lyrica. She has visual hallucinations on it in the past. She is not sure if Lyrica was the cause of this and it not having problems so far on it. She is increasing slowly.     Patient describes degenerative disc disease with sciatica. Severe pain starting in Daugherty back/hip and extends down to feet and ankles. Shooting tingling burning nerve.     Pain score 7-8/10 on average, 7/10 on good days, 9/10 on bad days, 7 to 6/10 after medication (Lyrica and acetaminophen barely take the edge off and allows minimal ADLs). Patient reports a tolerable pain score is 2-3/10.     Pt notes pain is most intolerable in the all day/constant.    Relieving factors: PT/OT-just started last week, tried exercises at home and pain increased  Exacerbating factors: movement, prolong standing, bending, and walking  Any walking and any standing. She avoids moving.   Functional goals include: ADLs    > Sleep:   Patient reports tossing and turning. She wakes up in pain and takes acetaminophen. She has to sleep on her right side.     > SE:   Patient denies side effects including- denies side effects currently    > Upcoming procedures: spinal injection possible but she is trying to avoid needing this    > Medications:    Medications tried in the past:     Medication Patient Reported SIG Pain Result    Tramadol   Did not help   Gabapentin      Cyclobenazprine 5 mg  Did not help   Butrans  Max dose not effective    Pregablin   SEs, somnolent             Other treatment modalities tried:  physical therapy/occupational therapy     Patient's preferred pharmacy: [facility] on [address]  in [place]     Social History 04/13/23:   Alcohol Use: rare- only on cruises, and only 2 drinks in 2 weeks  Cannabis Use: no  Illicit Substances: no  Smoking: no    Medications: Medication reconciliation 04/13/23   Current Medications[1]    Allergies:   Sulfa (Sulfonamide Antibiotics)    Rash  Contrast Dye [Radiopaque Agent]     Hives    Comment:Reports has recently tolerated well  Hydrochlorothiazide    Other-Reaction in Comments    Comment:Patient reports  Januvia [Sitagliptin]    Other-Reaction in Comments    Comment:Patient reports  Keflex [Cephalexin]    Abdominal Pain  Lyrica [Pregabalin]    Other-Reaction in Comments    Comment:Patient reports  Omnipaque [Iohexol]    Other-Reaction in Comments    Comment:Patient reports  Prozac [Fluoxetine Hcl]    Other-Reaction in Comments    Comment:Patient reports  Topiramate    Other-Reaction in Comments    Comment:Hairfall       PHQ-9:       10/15/2022     8:33 AM 12/25/2022     9:19 AM 05/13/2023    10:51 AM   PHQ-9   Interest 3 3 0   Feeling 2 2 0   Sleeping 1 1    Tired 3 3    Appetite 2 0    Feeling Bad 1 1    Concentrating 0 0    Moving and Speaking 0 0    Suicidal Thoughts 0 0    PHQ-9 Total 12 10    PHQ-9 Affective Total 6 6    PHQ-9 Physical Total 6 4    PHQ-9 Cognitive Total 0 0           Total Score Depression Severity   1-4 Minimal Depression   5-9 Mild Depression   10-14 Moderate Depression   15-19 Moderately Severe Depression   20-27 Severe Depression        GAD-7:       06/01/2019    10:15 AM 08/31/2019    10:15 AM 12/10/2021    11:17 AM   GAD7 Results   Feeling nervous, anxious or on edge? 1-Several Days 1-Several Days    Not being able to stop or control worrying? 2-More than half the days 1-Several days    Worrying too much about different things? 2-More than half the days 1-Several days    Trouble relaxing? 2-More than half the days 0-Not at all    Being so restless that it is hard to sit still? 0-Not at all 0-Not at all    Becoming easily annoyed or irritable? 2-More than half the days 1-Several days    Feeling afraid as if something awful might happen? 0-Not at all 0-Not at all    For office coding: Total Score 9 4    Date questionnaire was completed 06/01/2019 08/31/2019    Name of Provider documenting questionnaire Dr Grier Rocher Dr Grier Rocher    Feeling Nervous, Anxious, or on Edge   1   Not  Being Able to Stop or Control Worrying   1   Worrying too Much About Different Things   1   Trouble Relaxing   0   Being so Restless That it is Hard to Sit Still   0   Becoming Easily Annoyed or Irritable   2   Feeling Afraid as if Something Awful Might Happen   0   GAD-7 Total Score   5  If you checked off any problems, how difficult have these problems made it for you to do your work, take care of things at home, or get along with other people?   Somewhat difficult         Total Score Anxiety Severity   1-4 Minimal Anxiety   5-9 Mild Anxiety   10-14 Moderate Anxiety   15-21 Severe Anxiety        Last UTOX:   No results found for: "BARBSSCRNU", "BENZOSSCRNU", "COCAINESCRNU", "OPIATESSCRU", "AMPHETSCRNU", "DRUGGCSCRNU"    Opiates Quant/Confirm,Urine:   No results found for: "CODEINE", "MORPHINE", "HYDROCODONE", "HYDROMORPH", "DIHYDROCOD", "NORHYDROCOD", "OXYCODONE", "OXYMORPHONE", "NOROXYCODONE", "6AM"    Medical notes are meant to convey information between members of our health care team. Therefore these notes require medical terminology for precision and efficiency.   Abbreviations may also be used. Choice of words might differ from common language due to nuances of meaning specific to healthcare.  If you have questions or concerns about this note, please schedule an appointment and we can discuss any concerns at your convenience. Alteration of the medical record cannot be guaranteed.            [1]   Current Outpatient Medications   Medication Sig Dispense Refill    Albuterol (PROAIR HFA, PROVENTIL HFA, VENTOLIN HFA) 90 mcg/actuation inhaler INHALE 1 TO 2 PUFFS BY MOUTH EVERY 6 HOURS AS NEEDED FOR WHEEZING 8.5 g 0    Amlodipine (NORVASC) 10 mg Tablet Take 1 tablet by mouth every day. 90 tablet 3    Atorvastatin (LIPITOR) 20 mg Tablet Take 1 tablet by mouth every day at bedtime. 90 tablet 3    Blood Sugar Diagnostic (ONETOUCH ULTRA TEST) Strips Use to test blood glucose 2x/day 100 strip 11    Buprenorphine  (BUTRANS) 20 mcg/hour Patch Apply 1 patch to the skin one time each week. Patient is stopping buprenorphine (Butrans) 15 mcg/hr patch and increasing to 20 mcg/hr. Please fill this new strength ASAP as her pain is uncontrolled. She will only be using one strength at a time. Thanks! 4 patch 0    Buprenorphine (SUBUTEX) 2 mg Sublingual Tablet Dissolve 1 tablet under the tongue 2 times daily. 60 tablet 0    CloNIDine (CATAPRES) 0.1 mg Tablet Take 2 tablets by mouth every day at bedtime. Indications: "change of life" signs 180 tablet 3    Clopidogrel (PLAVIX) 75 mg Tablet Take 1 tablet by mouth every morning. 90 tablet 3    Duloxetine (CYMBALTA) 20 mg Delayed Release Capsule Take 1 capsule by mouth every day. 30 capsule 11    Hydrocortisone (ANUSOL-HC) 25 mg Suppository Insert 1 suppository into the rectum every 12 hours. 12 suppository 0    Losartan (COZAAR) 100 mg tablet Take 1 tablet by mouth every day. 90 tablet 3    Metformin (GLUCOPHAGE) 1,000 mg tablet Take 1 tablet by mouth once daily with a meal. 90 tablet 3    Metoclopramide (REGLAN) 5 mg Tablet Take 1 tablet by mouth once daily if needed (for nausea).      Naloxone (NARCAN) 4 mg/actuation Nasal Spray Use only when an opioid overdose emergency is suspected.  Spray into one nostril upon signs of opioid overdose. Call 911.   Additional doses may be given every 2 to 3 minutes until emergency medical assistance arrives.  Alternate nostrils with each dose.  Use each nasal spray only one time. 1 each 1    Ondansetron (ZOFRAN-ODT) 8 mg disintegrating tablet Take 1 tablet by mouth every  8 hours if needed. 100 tablet 0    Pregabalin (LYRICA) 100 mg capsule Take 1 capsule by mouth 2 times daily for 7 days. 14 capsule 0    Pregabalin (LYRICA) 25 mg Capsule Take 1 capsule by mouth 2 times daily for 7 days, THEN 1 capsule every day at bedtime for 7 days. 21 capsule 0    Pregabalin (LYRICA) 50 mg Capsule Take 1 capsule by mouth 2 times daily for 7 days. 14 capsule 0     Pregabalin (LYRICA) 75 mg capsule Take 1 capsule by mouth 2 times daily for 7 days. 14 capsule 0    Triamcinolone (KENALOG) 0.1 % Cream Apply to the affected area 2 times daily. 30 g 1     No current facility-administered medications for this visit.

## 2023-06-15 ENCOUNTER — Encounter: Payer: Self-pay | Admitting: Student in an Organized Health Care Education/Training Program

## 2023-06-15 ENCOUNTER — Ambulatory Visit: Payer: Medicare Other

## 2023-06-15 ENCOUNTER — Telehealth: Payer: Self-pay | Admitting: "Endocrinology

## 2023-06-15 NOTE — Telephone Encounter (Signed)
Form received from [facility] for glucose test strips; placed in Dr. Theressa Millard inbox for review and signature.

## 2023-06-15 NOTE — Telephone Encounter (Signed)
Informed patient that she has labs in her chart that are still current.  No fasting required.  Acknowledge and Encounter closed.  FYI to MD.

## 2023-06-15 NOTE — Progress Notes (Signed)
Clinical Pharmacist Chronic Pain and Opioid Stewardship Telephone Visit Note    Phone call to patient at [phone]. Spoke to patient.     Date of Pharmacist Intake:  04/13/23    Assessment/Plan:     Paula Daugherty presents with chronic pain that is potentially neuropathic in nature. Patient is currently looking to optimize their current pain regimen of buprenorphine (Subutex) 2 mg tabs, 1/4 tab twice daily. (1 mg TDD/OMED ~12.5 mg). She started buprenorphine (Butrans) patch on 04/30/23. Max dose ineffective. She was opiate naive prior. The patient endorses pain control is  improving since last visit and is not meeting functional goals. Current OMED ~12.5 mg on mixed agonist/antagonist . Patient has the following opioid related risk factors:  concomitant gabapentinoid therapy, age 76 or older, and renal/hepatic insufficiency. The patient may also be experiencing other side effects from current regimen such as confusion/mental cloudiness and drowsiness. Last eGFR 03/11/23 46.     Lyrica is being tapered down weekly as of 05/26/23. She is using 50 mg twice daily now. She has started duloxetine 20 mg daily 05/15/23 with plans to titrate up the dose but she has been feeling sleepy on both medications. She will see more pain relief with duloxetine at 60 mg daily. She tried magnesium for cramps/spasms but is sleeping more that she prefers so she will stop this for now. Concern for over sedation currently.     She is working with the pain clinic. Epidural on 04/23/23 afforded no relief. Myelogram is the next step with them. She reports she is not ready to consider surgery.     Paula Daugherty rotated from buprenorphine (Butrans) max dose to buprenorphine SL 2 mg twice daily but this made her too drowsy so she needed to decrease the dose. She does get some pain relief from it so we are balancing SEs (also SEs from other medications) and efficacy. She has been taking 1/4 tab twice daily and is feeling less tired and less restless in her  sleep but she is still sleeping longer than she prefers and still taking some naps though these are less consistent.     Paula Daugherty reports taking acetaminophen arthritis 650 mg 2 tabs in the AM and midday and 1 tab at bedtime, up to 3,250 mg a day. Discussed limiting duration of this dose and trying to keep the dose under 3,000 mg daily. She reports she cannot take NSAIDS due to decreased renal function. She is trying Voltaren gel but no great relief from it.       Recommendations:   - Continue the lower dose of buprenorphine she started today of 1/4 tab or 0.5 mg BID (OMED 12.5 mg) to see if oversleeping improves without change in pain control. Can consider increasing if sedation resolves as she tapers off pregabalin.   - Decrease pregablin 50 mg twice daily as planned to 25 mg twice daily on 2/15 which may also cause somnolence  - Continue ATC acetaminophen ER  - 1,300 mg qa.m, 1,300 mg mid day, and 650 mg at bedtime, TDD 3,250 mg. Decrease under 3 gm when able.   - Continue ATC diclofenact gel TID.     - Stop magnesium 500 mg at bedtime   - She will follow up with Dr. Delford Field 2/20 and consider increasing duloxetine to 40 mg daily at that time. She will start keeping a log of SEs.     - Extensive opioid education provided with emphasis on patient's specific risks.  - Recommend non pharmacological  therapy including: rest, physical therapy/occupational therapy, and mindful pacing of activities.    Risk Reduction Interventions:  - Narcan prescribed, education provided to patient and/or caregiver.   - optimization of non-opioid adjuvant therapy  and reduction of APAP to NTE 3g/day    Follow-up on 2-3 week via Telephone visit.      Hollace Hayward, Pharm. Orson Eva, BCPS  Primary Care Clinical Pharmacist  [facility]  [address]   [place], [place] [address]   [phone]          Subjective/Objective:  Paula Daugherty is a 78yr old female referred  by Garth Bigness, MD  for: Consider for buprenorphine? Opioid naive but with chronic pain uncontrolled     Interval History:   06/15/23  -Paula Daugherty is feeling her pain is better. Not all the way there but she feels it has come a long way.   -Worst pain is calf and ankle area. She feels there may be two separate issues causing her pain. She is wondering if her nerve study showed something for her ankle.   -She is wrapping her calf and ankle every morning which is helping and giving her more stability.   -She is taking buprenorphine 1/4 tab twice daily. She is considering taking 1/4 more tab but is going to tough it out until she see's Dr. Delford Field 2/20.  -She did decrease her pregabalin to 50 mg twice daily on 06/13/23. She will decrease to 25 mg twice daily on   -She is taking less naps. She is sleeping in later in the morning some days.   -She did pick up the diclofenac gel and will try this.   -She is thinking she may try decreasing or stopping magnesium to help with sleeping too much.   -She is taking acetaminophen ER 650 mg 2 in the AM, 2 midday, 1 at bedtime. (Taking 3,250 mg TDD)  -She is considering increasing duloxetine in February after decreasing pregabalin if she is not feeling so sleepy.   -Her sleep has been more peaceful. She is not twitching or talking in her sleep.       Current treatment plan:    Medication Patient Reported SIG Pain Result/ Notes   Lyrica 50 mg Twice daily  Not helping - tapering off   Acetaminophen 650 mg 2 -2-1 tabs three times daily  Helps, TDD 3,250 mg   Buprenorphine (Subutex) 2 mg tabs 1/4 tab twice daily  some relief    Duloxetine 20 mg Daily  Started 1/10, not noticeable effects    Voltaren gel  Not sure if helping             Treatment History:  04/13/23: Lyrica 100 mg twice daily, acetaminophen 6-8 tabs a day (unknown strength)  04/30/23: Start buprenorphine (Butrans) 5 mcg/hr patch, OMED ~11 mg  05/07/23: Increase buprenorphine (Butrans) to 10 mcg/hr, OMED ~21 mg  05/13/23:  added duloxetine 20 mg daily  05/14/23: Increase buprenorphine (Butrans) to 15 mcg/hr, OMED ~32 mg  05/19/23: Increase buprenorphine (Butrans) to 20 mcg/hr, OMED ~43 mg  05/26/23: tapering off pregabalin, rotate buprenorphine (Butrans) to SL buprenorphine 2 mg twice daily, OMED 50 mg  06/01/23: buprenorphine SL decreased to 1 mg twice daily (OMED 25 mg)  06/05/23: buprenorphine 1 mg QAM, 0.5 mg QPM (OMED ~19 mg)  06/11/23: pregabalin 75 mg twice daily, decreasing to 50 mg on 2/8. Decrease buprenorphine to 0.5 mg (1/4 tan) twice daily. (OMED 12.5 mg)  06/13/23:  pregabalin decreased to 50 mg twice daily     CURES Reviewed: 06/15/23      Current PPA: 05/13/23   Last PCP visit: 05/26/23, next 06/25/23  Narcan available to patient: Yes, expired No    Pain History 04/13/23:  Patient states reason for referral is discuss pain medications.    Chronic Pain Indication: M51.16 (ICD-10-CM) - Lumbar disc disease with radiculopathy     > Pain:  Reports pain is located in: lower back radiating all the way down her leg.    12/6:  Paula Daugherty is not taking opioids for chronic pain but her pain is uncontrolled and severely limiting ADLs.  -She is taking methylprednisolone from pain clinic but it is not helping so far.  -Pain from ADLs like standing in the shower is excruciating.   -She is titrating up Lyrica. She has visual hallucinations on it in the past. She is not sure if Lyrica was the cause of this and it not having problems so far on it. She is increasing slowly.     Patient describes degenerative disc disease with sciatica. Severe pain starting in Daugherty back/hip and extends down to feet and ankles. Shooting tingling burning nerve.     Pain score 7-8/10 on average, 7/10 on good days, 9/10 on bad days, 7 to 6/10 after medication (Lyrica and acetaminophen barely take the edge off and allows minimal ADLs). Patient reports a tolerable pain score is 2-3/10.     Pt notes pain is most intolerable in the all day/constant.    Relieving factors: PT/OT-just  started last week, tried exercises at home and pain increased  Exacerbating factors: movement, prolong standing, bending, and walking  Any walking and any standing. She avoids moving.   Functional goals include: ADLs    > Sleep:   Patient reports tossing and turning. She wakes up in pain and takes acetaminophen. She has to sleep on her right side.     > SE:   Patient denies side effects including- denies side effects currently    > Upcoming procedures: spinal injection possible but she is trying to avoid needing this    > Medications:    Medications tried in the past:     Medication Patient Reported SIG Pain Result    Tramadol   Did not help   Gabapentin      Cyclobenazprine 5 mg  Did not help   Butrans  Max dose not effective    Pregablin   SEs, somnolent             Other treatment modalities tried:  physical therapy/occupational therapy     Patient's preferred pharmacy: [facility] on [address] in [place]     Social History 04/13/23:   Alcohol Use: rare- only on cruises, and only 2 drinks in 2 weeks  Cannabis Use: no  Illicit Substances: no  Smoking: no    Medications: Medication reconciliation 04/13/23   Current Medications[1]    Allergies:   Sulfa (Sulfonamide Antibiotics)    Rash  Contrast Dye [Radiopaque Agent]    Hives    Comment:Reports has recently tolerated well  Hydrochlorothiazide    Other-Reaction in Comments    Comment:Patient reports  Januvia [Sitagliptin]    Other-Reaction in Comments    Comment:Patient reports  Keflex [Cephalexin]    Abdominal Pain  Lyrica [Pregabalin]    Other-Reaction in Comments    Comment:Patient reports  Omnipaque [Iohexol]    Other-Reaction in Comments    Comment:Patient reports  Prozac [Fluoxetine Hcl]  Other-Reaction in Comments    Comment:Patient reports  Topiramate    Other-Reaction in Comments    Comment:Hairfall       PHQ-9:       10/15/2022     8:33 AM 12/25/2022     9:19 AM 05/13/2023    10:51 AM   PHQ-9   Interest 3 3 0   Feeling 2 2 0   Sleeping 1 1    Tired 3 3    Appetite  2 0    Feeling Bad 1 1    Concentrating 0 0    Moving and Speaking 0 0    Suicidal Thoughts 0 0    PHQ-9 Total 12 10    PHQ-9 Affective Total 6 6    PHQ-9 Physical Total 6 4    PHQ-9 Cognitive Total 0 0           Total Score Depression Severity   1-4 Minimal Depression   5-9 Mild Depression   10-14 Moderate Depression   15-19 Moderately Severe Depression   20-27 Severe Depression        GAD-7:       06/01/2019    10:15 AM 08/31/2019    10:15 AM 12/10/2021    11:17 AM   GAD7 Results   Feeling nervous, anxious or on edge? 1-Several Days 1-Several Days    Not being able to stop or control worrying? 2-More than half the days 1-Several days    Worrying too much about different things? 2-More than half the days 1-Several days    Trouble relaxing? 2-More than half the days 0-Not at all    Being so restless that it is hard to sit still? 0-Not at all 0-Not at all    Becoming easily annoyed or irritable? 2-More than half the days 1-Several days    Feeling afraid as if something awful might happen? 0-Not at all 0-Not at all    For office coding: Total Score 9 4    Date questionnaire was completed 06/01/2019 08/31/2019    Name of Provider documenting questionnaire Dr Grier Rocher Dr Grier Rocher    Feeling Nervous, Anxious, or on Edge   1   Not Being Able to Stop or Control Worrying   1   Worrying too Much About Different Things   1   Trouble Relaxing   0   Being so Restless That it is Hard to Sit Still   0   Becoming Easily Annoyed or Irritable   2   Feeling Afraid as if Something Awful Might Happen   0   GAD-7 Total Score   5   If you checked off any problems, how difficult have these problems made it for you to do your work, take care of things at home, or get along with other people?   Somewhat difficult         Total Score Anxiety Severity   1-4 Minimal Anxiety   5-9 Mild Anxiety   10-14 Moderate Anxiety   15-21 Severe Anxiety        Last UTOX:   No results found for: "BARBSSCRNU", "BENZOSSCRNU", "COCAINESCRNU", "OPIATESSCRU",  "AMPHETSCRNU", "DRUGGCSCRNU"    Opiates Quant/Confirm,Urine:   No results found for: "CODEINE", "MORPHINE", "HYDROCODONE", "HYDROMORPH", "DIHYDROCOD", "NORHYDROCOD", "OXYCODONE", "OXYMORPHONE", "NOROXYCODONE", "6AM"    Medical notes are meant to convey information between members of our health care team. Therefore these notes require medical terminology for precision and efficiency.   Abbreviations may also be used. Choice of words  might differ from common language due to nuances of meaning specific to healthcare.  If you have questions or concerns about this note, please schedule an appointment and we can discuss any concerns at your convenience. Alteration of the medical record cannot be guaranteed.            [1]   Current Outpatient Medications   Medication Sig Dispense Refill    Albuterol (PROAIR HFA, PROVENTIL HFA, VENTOLIN HFA) 90 mcg/actuation inhaler INHALE 1 TO 2 PUFFS BY MOUTH EVERY 6 HOURS AS NEEDED FOR WHEEZING 8.5 g 0    Amlodipine (NORVASC) 10 mg Tablet Take 1 tablet by mouth every day. 90 tablet 3    Atorvastatin (LIPITOR) 20 mg Tablet Take 1 tablet by mouth every day at bedtime. 90 tablet 3    Blood Sugar Diagnostic (ONETOUCH ULTRA TEST) Strips Use to test blood glucose 2x/day 100 strip 11    Buprenorphine (SUBUTEX) 2 mg Sublingual Tablet Dissolve 1 tablet under the tongue 2 times daily. 60 tablet 0    CloNIDine (CATAPRES) 0.1 mg Tablet Take 2 tablets by mouth every day at bedtime. Indications: "change of life" signs 180 tablet 3    Clopidogrel (PLAVIX) 75 mg Tablet Take 1 tablet by mouth every morning. 90 tablet 3    Diclofenac (VOLTAREN ARTHRITIS PAIN) 1 % Gel Apply to the affected area 4 times daily. 100 g 1    Duloxetine (CYMBALTA) 20 mg Delayed Release Capsule Take 1 capsule by mouth every day. 30 capsule 11    Hydrocortisone (ANUSOL-HC) 25 mg Suppository Insert 1 suppository into the rectum every 12 hours. 12 suppository 0    Losartan (COZAAR) 100 mg tablet Take 1 tablet by mouth every day. 90  tablet 3    Metformin (GLUCOPHAGE) 1,000 mg tablet Take 1 tablet by mouth once daily with a meal. 90 tablet 3    Metoclopramide (REGLAN) 5 mg Tablet Take 1 tablet by mouth once daily if needed (for nausea).      Naloxone (NARCAN) 4 mg/actuation Nasal Spray Use only when an opioid overdose emergency is suspected.  Spray into one nostril upon signs of opioid overdose. Call 911.   Additional doses may be given every 2 to 3 minutes until emergency medical assistance arrives.  Alternate nostrils with each dose.  Use each nasal spray only one time. 1 each 1    Ondansetron (ZOFRAN-ODT) 8 mg disintegrating tablet Take 1 tablet by mouth every 8 hours if needed. 100 tablet 0    Pregabalin (LYRICA) 25 mg Capsule Take 1 capsule by mouth 2 times daily for 7 days, THEN 1 capsule every day at bedtime for 7 days. 21 capsule 0    Pregabalin (LYRICA) 50 mg Capsule Take 1 capsule by mouth 2 times daily for 7 days. 14 capsule 0    Pregabalin (LYRICA) 75 mg capsule Take 1 capsule by mouth 2 times daily for 7 days. 14 capsule 0    Triamcinolone (KENALOG) 0.1 % Cream Apply to the affected area 2 times daily. 30 g 1     No current facility-administered medications for this visit.

## 2023-06-16 NOTE — Telephone Encounter (Signed)
Paperwork from [employer]  was completed by MD and was faxed to [facility]  on 06/16/2023    Thank you,    Marcheta Grammes, MA

## 2023-06-17 ENCOUNTER — Ambulatory Visit: Payer: Medicare Other | Attending: Family Medicine

## 2023-06-17 DIAGNOSIS — Z794 Long term (current) use of insulin: Secondary | ICD-10-CM | POA: Insufficient documentation

## 2023-06-17 DIAGNOSIS — E1129 Type 2 diabetes mellitus with other diabetic kidney complication: Secondary | ICD-10-CM | POA: Insufficient documentation

## 2023-06-17 DIAGNOSIS — M5442 Lumbago with sciatica, left side: Secondary | ICD-10-CM | POA: Insufficient documentation

## 2023-06-17 DIAGNOSIS — G8929 Other chronic pain: Secondary | ICD-10-CM | POA: Insufficient documentation

## 2023-06-17 DIAGNOSIS — K7581 Nonalcoholic steatohepatitis (NASH): Secondary | ICD-10-CM | POA: Insufficient documentation

## 2023-06-17 LAB — MICROALBUMIN
Creatinine Spot Urine: 155.2 mg/dL
Microalbumin Urine: 34.1 mg/dL
Microalbumin/Creatinine Ratio: 220 mg/g

## 2023-06-17 LAB — CBC NO DIFFERENTIAL
Hematocrit: 35.4 % — ABNORMAL LOW (ref 36.0–46.0)
Hemoglobin: 11.4 g/dL — ABNORMAL LOW (ref 12.0–16.0)
MCH: 27.2 pg (ref 27.0–33.0)
MCHC: 32.3 % (ref 32.0–36.0)
MCV: 84.3 fL (ref 80.0–100.0)
MPV: 8.6 fL (ref 6.8–10.0)
Platelet Count: 178 10*3/uL (ref 130–400)
RDW: 15.3 % — ABNORMAL HIGH (ref 0.0–14.7)
Red Blood Cell Count: 4.2 10*6/uL (ref 4.00–5.20)
White Blood Cell Count: 4.7 10*3/uL (ref 4.5–11.0)

## 2023-06-17 LAB — FERRITIN: Ferritin: 21 ng/mL (ref 13–150)

## 2023-06-17 LAB — CREATININE SPOT URINE: Creatinine Spot Urine: 155.2 mg/dL

## 2023-06-18 ENCOUNTER — Ambulatory Visit: Payer: Self-pay | Admitting: Student in an Organized Health Care Education/Training Program

## 2023-06-18 LAB — HEMOGLOBIN A1C
Hgb A1C,Glucose Est Avg: 163 mg/dL
Hgb A1C: 7.3 % — ABNORMAL HIGH (ref 3.9–5.6)

## 2023-06-23 ENCOUNTER — Ambulatory Visit: Payer: Medicare Other | Attending: "Endocrinology | Admitting: "Endocrinology

## 2023-06-23 ENCOUNTER — Encounter: Payer: Self-pay | Admitting: "Endocrinology

## 2023-06-23 VITALS — BP 144/73 | HR 60 | Temp 96.5°F | Wt 164.9 lb

## 2023-06-23 DIAGNOSIS — Z794 Long term (current) use of insulin: Secondary | ICD-10-CM | POA: Insufficient documentation

## 2023-06-23 DIAGNOSIS — E1129 Type 2 diabetes mellitus with other diabetic kidney complication: Secondary | ICD-10-CM | POA: Insufficient documentation

## 2023-06-23 DIAGNOSIS — Z7984 Long term (current) use of oral hypoglycemic drugs: Secondary | ICD-10-CM | POA: Insufficient documentation

## 2023-06-23 NOTE — Nursing Note (Signed)
Pt left without repeat vitals.

## 2023-06-23 NOTE — Nursing Note (Signed)
 Patient identified x2, vital signs taken, allergies verified, screened for pain, tobacco use screened. Pharmacy confirmed. Proper PPE worn: Mask worn while taking patients vitals as well patient wearing mask.     Marlana Latus, MA

## 2023-06-25 ENCOUNTER — Ambulatory Visit
Payer: Medicare Other | Attending: Student in an Organized Health Care Education/Training Program | Admitting: Student in an Organized Health Care Education/Training Program

## 2023-06-25 ENCOUNTER — Ambulatory Visit (INDEPENDENT_AMBULATORY_CARE_PROVIDER_SITE_OTHER): Payer: Medicare Other

## 2023-06-25 VITALS — BP 123/55 | HR 64 | Temp 98.2°F | Ht 62.0 in | Wt 166.0 lb

## 2023-06-25 DIAGNOSIS — L299 Pruritus, unspecified: Secondary | ICD-10-CM

## 2023-06-25 DIAGNOSIS — K7460 Unspecified cirrhosis of liver: Secondary | ICD-10-CM | POA: Insufficient documentation

## 2023-06-25 DIAGNOSIS — R400 Somnolence: Secondary | ICD-10-CM | POA: Insufficient documentation

## 2023-06-25 DIAGNOSIS — M5116 Intervertebral disc disorders with radiculopathy, lumbar region: Secondary | ICD-10-CM | POA: Insufficient documentation

## 2023-06-25 DIAGNOSIS — G8929 Other chronic pain: Secondary | ICD-10-CM | POA: Insufficient documentation

## 2023-06-25 DIAGNOSIS — M5442 Lumbago with sciatica, left side: Secondary | ICD-10-CM | POA: Insufficient documentation

## 2023-06-25 DIAGNOSIS — F32A Depression, unspecified: Secondary | ICD-10-CM | POA: Insufficient documentation

## 2023-06-25 DIAGNOSIS — G5732 Lesion of lateral popliteal nerve, left lower limb: Secondary | ICD-10-CM | POA: Insufficient documentation

## 2023-06-25 LAB — COMPREHENSIVE METABOLIC PANEL
Alanine Transferase (ALT): 27 U/L (ref <=33)
Albumin: 4.2 g/dL (ref 4.0–4.9)
Alkaline Phosphatase (ALP): 136 U/L — ABNORMAL HIGH (ref 35–129)
Anion Gap: 14 mmol/L (ref 7–15)
Aspartate Transaminase (AST): 43 U/L — ABNORMAL HIGH (ref <=41)
Bilirubin Total: 0.8 mg/dL (ref <=1.2)
Calcium: 9.6 mg/dL (ref 8.6–10.0)
Carbon Dioxide Total: 25 mmol/L (ref 22–29)
Chloride: 101 mmol/L (ref 98–107)
Creatinine Serum: 1.22 mg/dL — ABNORMAL HIGH (ref 0.51–1.17)
E-GFR Creatinine (Female): 46 mL/min/{1.73_m2}
Glucose: 119 mg/dL — ABNORMAL HIGH (ref 74–109)
Potassium: 4.8 mmol/L (ref 3.4–5.1)
Protein: 7.9 g/dL (ref 6.6–8.7)
Sodium: 140 mmol/L (ref 136–145)
Urea Nitrogen, Blood (BUN): 24 mg/dL — ABNORMAL HIGH (ref 6–20)

## 2023-06-25 LAB — TRANSFERRIN
Iron Percent Saturation: 16.9 % (ref 15.0–50.0)
Total Iron Binding Capacity: 396 ug/dL (ref 280–400)
Transferrin: 285 mg/dL (ref 200–360)

## 2023-06-25 LAB — IRON TOTAL: Iron Total: 67 ug/dL (ref 37–158)

## 2023-06-25 MED ORDER — DULOXETINE 30 MG CAPSULE,DELAYED RELEASE
30.0000 mg | DELAYED_RELEASE_CAPSULE | Freq: Every day | ORAL | 11 refills | Status: DC
Start: 2023-06-25 — End: 2023-08-07

## 2023-06-25 NOTE — Patient Instructions (Signed)
 Thank you for letting me participate in your care.  It was very nice to see you today!    Please go to the pharmacy to pick up your medication. Take as prescribed   If you are checking into lab or x-ray please check into the FRONT DESK first.    If you are not getting your labs done today, you can come in any time during business hours and walk into lab. X-Ray on site also takes walk ins.    IMPORTANT THINGS TO KNOW:   If you had a referral placed today, please give it 10 to 14 business days before calling about the referral as that is the down time it takes for the insurance and referral department to go through the process.    Mychart messages, results, and prescription refills may take up to 72 hours for a response.   Thank you for your patience.    Please call if you have any questions or concerns. Our phone number is [phone]    Follow up as discussed.     FYI:  A new federal rule requires test results to be released to you immediately, even if your provider has not yet had a chance to review and interpret them for you. Your care team may need up to about 3 business days to review your results and explain them, if needed. We appreciate your understanding.     Dr. Delford Field  Family Medicine Physician   [facility]

## 2023-06-25 NOTE — Nursing Note (Signed)
 Patient roomed, chief complaint taken, allergies verified, weight obtained, and pharmacy verified.  Gayland Curry, LVN

## 2023-06-25 NOTE — Progress Notes (Signed)
[facility] [place]  Family Medicine     Chief Complaint   Patient presents with    Follow-up     Sciatica          ASSESSMENT and PLAN:    1. Lumbar disc disease with radiculopathy    2. Left peroneal mononeuropathy    3. Chronic left-sided low back pain with left-sided sciatica         Assessment & Plan  Lumbar disc disease with radiculopathy  Left peroneal mononeuropathy  Chronic, ongoing.  Improvement noted with current regimen. EMG showed acute and chronic L4, L5 radiculopathy and mild left perineal mononeuropathy. Discussed potential peripheral nerve injection.  -Increase Cymbalta to 30 mg for now with plan to increase to 40 if tolerating  -Given improvement in somnolence, okay to increase Subutex back to half tab daily  -Follow-up as planned with Pharm.D.  -Consider discussing peripheral nerve injection with pain management specialist.    Depression  Noted in context of chronic pain and fatigue.  -Increase Cymbalta to 30mg  daily for pain management and mood improvement.    Somnolence  Side effect of likely multiple medications.  Improved.  Significant fatigue reported still, impacting daily activities.  -Continue monitoring for side effects with med adjustments     Cirrhosis of liver without ascites  Itching of both hands  Possible liver-related itching. Patient has known stage 4 cirrhosis.  -Order iron and transferrin tests to assess iron stores.  -Consider iron supplementation if stores are low.    General Health Maintenance  -Continue monitoring blood work results.  -Follow-up with pain management specialist regarding potential peripheral nerve injection.    The patient /  surrogate was counseled about the importance of Advance Care Planning. In addition:  The patient/surrogate and I reviewed ACP Documents. The documents in EMR are up to date.       Modified Medications    Modified Medication Previous Medication    DULOXETINE (CYMBALTA) 30 MG DELAYED RELEASE CAPSULE Duloxetine (CYMBALTA)  20 mg Delayed Release Capsule       Take 1 capsule by mouth every day.    Take 1 capsule by mouth every day.        SUBJECTIVE:  Paula Daugherty is a 78yr old female  here for chief complaint noted above.    History of Present Illness  Paula Daugherty, a 78 year old female with a history of chronic pain and stage 4 cirrhosis, presents for a follow-up visit. She reports an improvement in her lower back and hip pain, attributing this to medication adjustments and the use of a leg wrap for support. However, she continues to experience calf pain, which she describes as better but still present. The patient also mentions fatigue and sleep disturbances, including episodes of talking and acting out in her sleep. She reports feeling depressed, particularly due to her limited activity and the state of her home. The patient has noticed hand itching and swelling, which she suspects might be related to her liver condition. She also reports a decrease in her ferritin levels and some difficulty breathing after short walks. The patient's medication regimen includes Lyrica, Subutex, and Cymbalta, the dose of which is being adjusted.    ROS:  As noted in HPI. 10 point review of systems was otherwise negative.     Current Outpatient Medications   Medication Sig    Albuterol (PROAIR HFA, PROVENTIL HFA, VENTOLIN HFA) 90 mcg/actuation inhaler INHALE 1 TO 2 PUFFS BY MOUTH EVERY 6 HOURS  AS NEEDED FOR WHEEZING    Amlodipine (NORVASC) 10 mg Tablet Take 1 tablet by mouth every day.    Atorvastatin (LIPITOR) 20 mg Tablet Take 1 tablet by mouth every day at bedtime.    Blood Sugar Diagnostic (ONETOUCH ULTRA TEST) Strips Use to test blood glucose 2x/day    Buprenorphine (SUBUTEX) 2 mg Sublingual Tablet Dissolve 1 tablet under the tongue 2 times daily.    CloNIDine (CATAPRES) 0.1 mg Tablet Take 2 tablets by mouth every day at bedtime. Indications: "change of life" signs    Clopidogrel (PLAVIX) 75 mg Tablet Take 1 tablet by mouth every morning.     Diclofenac (VOLTAREN ARTHRITIS PAIN) 1 % Gel Apply to the affected area 4 times daily.    Duloxetine (CYMBALTA) 30 mg Delayed Release Capsule Take 1 capsule by mouth every day.    Hydrocortisone (ANUSOL-HC) 25 mg Suppository Insert 1 suppository into the rectum every 12 hours.    Losartan (COZAAR) 100 mg tablet Take 1 tablet by mouth every day.    Metformin (GLUCOPHAGE) 1,000 mg tablet Take 1 tablet by mouth once daily with a meal.    Metoclopramide (REGLAN) 5 mg Tablet Take 1 tablet by mouth once daily if needed (for nausea).    Naloxone (NARCAN) 4 mg/actuation Nasal Spray Use only when an opioid overdose emergency is suspected.  Spray into one nostril upon signs of opioid overdose. Call 911.   Additional doses may be given every 2 to 3 minutes until emergency medical assistance arrives.  Alternate nostrils with each dose.  Use each nasal spray only one time.    Ondansetron (ZOFRAN-ODT) 8 mg disintegrating tablet Take 1 tablet by mouth every 8 hours if needed.    Pregabalin (LYRICA) 25 mg Capsule Take 1 capsule by mouth 2 times daily for 7 days, THEN 1 capsule every day at bedtime for 7 days.    Pregabalin (LYRICA) 50 mg Capsule Take 1 capsule by mouth 2 times daily for 7 days.    Pregabalin (LYRICA) 75 mg capsule Take 1 capsule by mouth 2 times daily for 7 days.    Triamcinolone (KENALOG) 0.1 % Cream Apply to the affected area 2 times daily.     No current facility-administered medications for this visit.        OBJECTIVE:   BP 123/55 (SITE: left arm, Orthostatic Position: sitting, Cuff Size: regular)   Pulse 64   Temp 36.8 C (98.2 F) (Temporal)   Ht 1.575 m (5\' 2" )   Wt 75.3 kg (166 lb 0.1 oz)   LMP 05/06/1983   SpO2 94%   BMI 30.36 kg/m      Physical Exam      Gen: NAD, appears well   HEENT: EOMI, sclera clear, anicteric   Neck: trachea midline, no thyromegaly   Chest: CTAB, no wheezing   CV: RRR, no murmurs   Extrem: no cyanosis or edema, pulses palpable   Skin: no rashes or lesions   Neuro: no focal  deficits   Psych: normal mood and affect      Results  LABS  Hb: 11.4 g/dL  M0N: 0.2%  Ferritin: 21 ng/mL    DIAGNOSTIC  EMG: Acute and chronic L4, L5 radiculopathy; mild left peroneal mononeuropathy          06/25/2023    10:16 AM   PHQ-2   Interest 2   Feeling 2   PHQ-2 Total Score 4     No question data found.     Depression  Treatment Plan:  see assessment/plan section of note      No follow-ups on file.    I did review patient's past medical and family/social history, no changes noted.  Barriers to Learning assessed: none. Patient verbalizes understanding of teaching and instructions.  Education Needs Identified?   no    Patient was counseled specifically on the following: diagnostic results, impressions, and/or recommended diagnostic studies reviewed, instructions for management and/or follow-up, risk factor reduction and patient and family education    I spent 30 minutes with the patient, >50% of which were spent counseling her regarding differential, additional testing, if indicated, potential treatment options, and lifestyle modifications, if applicable    Today's visit has complexity inherent to evaluation and management associated with medical care services that serve as the continuing focal point for all needed health care services.    I obtained verbal consent from the patient to use AI ambient technology to transcribe the interactions between the patient and myself during the clinical encounter.    Electronically signed by:  Wyatt Portela, MD  Associate Physician  [facility]

## 2023-06-26 ENCOUNTER — Ambulatory Visit: Payer: Self-pay | Admitting: Student in an Organized Health Care Education/Training Program

## 2023-06-27 ENCOUNTER — Other Ambulatory Visit: Payer: Self-pay | Admitting: Obstetrics & Gynecology

## 2023-06-28 ENCOUNTER — Other Ambulatory Visit: Payer: Self-pay | Admitting: Obstetrics & Gynecology

## 2023-06-29 NOTE — Telephone Encounter (Signed)
Last office visit 08/12/22.

## 2023-06-29 NOTE — Telephone Encounter (Signed)
 Duplicate refill request. Another order pending

## 2023-06-30 ENCOUNTER — Ambulatory Visit: Payer: Medicare Other

## 2023-06-30 NOTE — Progress Notes (Signed)
 Clinical Pharmacist Chronic Pain and Opioid Stewardship Telephone Visit Note    Phone call to patient at [phone]. Spoke to patient.     Date of Pharmacist Intake:  04/13/23    Assessment/Plan:     Paula Daugherty presents with chronic pain that is potentially neuropathic in nature. Patient is currently looking to optimize their current pain regimen of buprenorphine (Subutex) 2 mg tabs, 1/2 tab twice daily. (2 mg TDD/OMED ~25 mg). She started buprenorphine (Butrans) patch on 04/30/23. Max dose ineffective. She was opiate naive prior. The patient endorses pain control is improving since last visit and is meeting functional goals. Current OMED 25 mg on mixed agonist/antagonist . Patient has the following opioid related risk factors:  concomitant gabapentinoid therapy, age 76 or older, and renal/hepatic insufficiency. The patient may also be experiencing other side effects from current regimen such as confusion/mental cloudiness and drowsiness. Last eGFR 06/25/23 46.     Paula Daugherty rotated from buprenorphine (Butrans) max dose to buprenorphine SL 2 mg twice daily but this made her too drowsy so she needed to decrease the dose. She does get some pain relief from it so we are balancing SEs (also SEs from other medications) and efficacy. She had been taking 1/4 tab twice daily and is feeling less tired. Her PCP increased her dose to 1/2 tab twice daily on 06/25/23.    Lyrica is being tapered down weekly as of 05/26/23. She is using 25 mg once daily now and will stop later this week. She has started duloxetine 20 mg daily 05/15/23 and increased to 30 mg daily on  06/25/23. She will see more pain relief with duloxetine at 60 mg daily. She will see more relief for mood after 4-6 weeks. She is using magnesium for cramps/spasms and sleep.    She is working with the pain clinic. Epidural on 04/23/23 afforded no relief. Myelogram is the next step with them but she does not feel her current pain level warrants this. She is inquiring about  another steroid injection instead. She reports she is not ready to consider surgery.     Paula Daugherty is taking acetaminophen arthritis 650 mg 2 tabs in the AM and 1 midday as needed and 1 at bedtime . Discussed limiting duration of this dose and trying to keep the dose under 3,000 mg daily. She reports she cannot take NSAIDS due to decreased renal function. She is trying diclofenac gel and is getting some relief from it.     Recommendations:   - Continue current dose of buprenorphine 1/2 tab or 1 mg BID (OMED 25 mg), recent increase by PCP 06/25/23. No SEs.   - Stop pregablin 25 mg once daily on 07/04/23 as planned  - Continue acetaminophen 650 mg ER  -> 1,300 mg qa.m, 650 mg as needed mid day, and 650 mg at bedtime, TDD 2,600 mg.  - Continue ATC diclofenact gel TID.     - Continue magnesium 500 mg at bedtime   - She will follow up with Dr. Delford Field 08/07/23 and consider increasing duloxetine to 40 mg daily at that time.   - Extensive opioid education provided with emphasis on patient's specific risks.  - Recommend non pharmacological therapy including: rest, physical therapy/occupational therapy, and mindful pacing of activities.    Risk Reduction Interventions:  - Narcan prescribed, education provided to patient and/or caregiver.   - optimization of non-opioid adjuvant therapy  and reduction of APAP to NTE 3g/day    Follow-up 4-6 weeks via Telephone visit.  Hollace Hayward, Pharm. Orson Eva, BCPS  Primary Care Clinical Pharmacist  [facility]  [address]   [place], [place] [address]   [phone]          Subjective/Objective:  Paula Daugherty is a 78yr old female referred by Garth Bigness, MD  for: Consider for buprenorphine? Opioid naive but with chronic pain uncontrolled     Interval History:   06/30/23:  Paula Daugherty reports she is doing well. She feels "we are not totally there, but we are close".  -Her ankle is the worst pain. The wrap  gives her calf more stability.  -She does not feel she needs the CT myelogram at this point.   -Per patient PCP is asking pain clinic about another steroid injection.  -She is working with her PCP for depression as well. She has a loss of appetite.   -Her PCP had her increase buprenorphine (Subutex) to 1/2 tab twice daily which has helped the pain. It is not completely gone but tolerable. No SEs since increasing the dose.   -She will follow-up  with PCP 4/4 and consider increasing duloxetine virtually and then in person 7/14.   -Paula Daugherty is taking acetaminophen ER 650 mg 2 in the AM, 1 midday as needed and 1 at bedtime.   -In the morning she takes: 2 tabs acetaminophen, 1/2 tab buprenorphine, diclofenac gel, and wraps her leg every day. At noon, she takes the acetaminophen as needed and Miralax as needed for constipation. In the PM, she takes pregabalin 25 mg, and will stop at end of the week, 2 tabs magnesium to help with sleep, 1/2 tab buprenorphine (Subutex), 30  mg of duloxetine for the next 30 days and diclofenac gel.      Current treatment plan:    Medication Patient Reported SIG Pain Result/ Notes   Lyrica 25 mg QPM  Not helping - tapering off, done 3/1   Acetaminophen 650 mg 2 -1-1 tabs three times daily  Helps, TDD 2,600 mg   Buprenorphine (Subutex) 2 mg tabs 1/2 tab twice daily  some relief    Duloxetine 30 mg 06/25/23 Daily  Started 20 mg on 1/10 titration, no noticeable effects    Voltaren gel  Not sure if helping    Magnesium 500 mg At bedtime              Treatment History:  04/13/23: Lyrica 100 mg twice daily, acetaminophen 6-8 tabs a day (unknown strength)  04/30/23: Start buprenorphine (Butrans) 5 mcg/hr patch, OMED ~11 mg  05/07/23: Increase buprenorphine (Butrans) to 10 mcg/hr, OMED ~21 mg  05/13/23: added duloxetine 20 mg daily  05/14/23: Increase buprenorphine (Butrans) to 15 mcg/hr, OMED ~32 mg  05/19/23: Increase buprenorphine (Butrans) to 20 mcg/hr, OMED ~43 mg  05/26/23: tapering off pregabalin, rotate  buprenorphine (Butrans) to SL buprenorphine 2 mg twice daily, OMED 50 mg  06/01/23: buprenorphine SL decreased to 1 mg twice daily (OMED 25 mg)  06/05/23: buprenorphine 1 mg QAM, 0.5 mg QPM (OMED ~19 mg)  06/11/23: pregabalin 75 mg twice daily, decreasing to 50 mg on 2/8. Decrease buprenorphine to 0.5 mg (1/4 tan) twice daily. (OMED 12.5 mg)  06/13/23: pregabalin decreased to 50 mg twice daily   06/25/23: Buprenorphine (Subutex) increased to 1/2 tab twice daily and duloxetine increased to 30 mg daily   07/04/23: will stop gabapentin taper    CURES Reviewed: 06/30/23        Current PPA: 05/13/23   Last PCP  visit: 06/25/23, next 08/07/23, then 11/16/23  Narcan available to patient: Yes, expired No    Pain History 04/13/23:  Patient states reason for referral is discuss pain medications.    Chronic Pain Indication: M51.16 (ICD-10-CM) - Lumbar disc disease with radiculopathy     > Pain:  Reports pain is located in: lower back radiating all the way down her leg.    12/6:  Paula Daugherty is not taking opioids for chronic pain but her pain is uncontrolled and severely limiting ADLs.  -She is taking methylprednisolone from pain clinic but it is not helping so far.  -Pain from ADLs like standing in the shower is excruciating.   -She is titrating up Lyrica. She has visual hallucinations on it in the past. She is not sure if Lyrica was the cause of this and it not having problems so far on it. She is increasing slowly.     Patient describes degenerative disc disease with sciatica. Severe pain starting in Daugherty back/hip and extends down to feet and ankles. Shooting tingling burning nerve.     Pain score 7-8/10 on average, 7/10 on good days, 9/10 on bad days, 7 to 6/10 after medication (Lyrica and acetaminophen barely take the edge off and allows minimal ADLs). Patient reports a tolerable pain score is 2-3/10.     Pt notes pain is most intolerable in the all day/constant.    Relieving factors: PT/OT-just started last week, tried exercises at home and pain  increased  Exacerbating factors: movement, prolong standing, bending, and walking  Any walking and any standing. She avoids moving.   Functional goals include: ADLs    > Sleep:   Patient reports tossing and turning. She wakes up in pain and takes acetaminophen. She has to sleep on her right side.     > SE:   Patient denies side effects including- denies side effects currently    > Upcoming procedures: spinal injection possible but she is trying to avoid needing this    > Medications:    Medications tried in the past:     Medication Patient Reported SIG Pain Result    Tramadol   Did not help   Gabapentin      Cyclobenazprine 5 mg  Did not help   Butrans  Max dose not effective    Pregablin   SEs, somnolent             Other treatment modalities tried:  physical therapy/occupational therapy     Patient's preferred pharmacy: [facility] on [address] in [place]     Social History 04/13/23:   Alcohol Use: rare- only on cruises, and only 2 drinks in 2 weeks  Cannabis Use: no  Illicit Substances: no  Smoking: no    Medications: Medication reconciliation 04/13/23   Current Medications[1]    Allergies:   Sulfa (Sulfonamide Antibiotics)    Rash  Contrast Dye [Radiopaque Agent]    Hives    Comment:Reports has recently tolerated well  Hydrochlorothiazide    Other-Reaction in Comments    Comment:Patient reports  Januvia [Sitagliptin]    Other-Reaction in Comments    Comment:Patient reports  Keflex [Cephalexin]    Abdominal Pain  Lyrica [Pregabalin]    Other-Reaction in Comments    Comment:Patient reports  Omnipaque [Iohexol]    Other-Reaction in Comments    Comment:Patient reports  Prozac [Fluoxetine Hcl]    Other-Reaction in Comments    Comment:Patient reports  Topiramate    Other-Reaction in Comments    Comment:Hairfall  PHQ-9:       12/25/2022     9:19 AM 05/13/2023    10:51 AM 06/25/2023    10:16 AM   PHQ-9   Interest 3 0 2   Feeling 2 0 2   Sleeping 1  2   Tired 3  3   Appetite 0  2   Feeling Bad 1  1   Concentrating 0  0    Moving and Speaking 0  0   Suicidal Thoughts 0  0   Problems   Very difficult   PHQ-9 Total 10  12   PHQ-9 Affective Total 6  5   PHQ-9 Physical Total 4  7   PHQ-9 Cognitive Total 0  0          Total Score Depression Severity   1-4 Minimal Depression   5-9 Mild Depression   10-14 Moderate Depression   15-19 Moderately Severe Depression   20-27 Severe Depression        GAD-7:       06/01/2019    10:15 AM 08/31/2019    10:15 AM 12/10/2021    11:17 AM   GAD7 Results   Feeling nervous, anxious or on edge? 1-Several Days 1-Several Days    Not being able to stop or control worrying? 2-More than half the days 1-Several days    Worrying too much about different things? 2-More than half the days 1-Several days    Trouble relaxing? 2-More than half the days 0-Not at all    Being so restless that it is hard to sit still? 0-Not at all 0-Not at all    Becoming easily annoyed or irritable? 2-More than half the days 1-Several days    Feeling afraid as if something awful might happen? 0-Not at all 0-Not at all    For office coding: Total Score 9 4    Date questionnaire was completed 06/01/2019 08/31/2019    Name of Provider documenting questionnaire Dr Grier Rocher Dr Grier Rocher    Feeling Nervous, Anxious, or on Edge   1   Not Being Able to Stop or Control Worrying   1   Worrying too Much About Different Things   1   Trouble Relaxing   0   Being so Restless That it is Hard to Sit Still   0   Becoming Easily Annoyed or Irritable   2   Feeling Afraid as if Something Awful Might Happen   0   GAD-7 Total Score   5   If you checked off any problems, how difficult have these problems made it for you to do your work, take care of things at home, or get along with other people?   Somewhat difficult         Total Score Anxiety Severity   1-4 Minimal Anxiety   5-9 Mild Anxiety   10-14 Moderate Anxiety   15-21 Severe Anxiety        Last UTOX:   No results found for: "BARBSSCRNU", "BENZOSSCRNU", "COCAINESCRNU", "OPIATESSCRU", "AMPHETSCRNU",  "DRUGGCSCRNU"    Opiates Quant/Confirm,Urine:   No results found for: "CODEINE", "MORPHINE", "HYDROCODONE", "HYDROMORPH", "DIHYDROCOD", "NORHYDROCOD", "OXYCODONE", "OXYMORPHONE", "NOROXYCODONE", "6AM"    Medical notes are meant to convey information between members of our health care team. Therefore these notes require medical terminology for precision and efficiency.   Abbreviations may also be used. Choice of words might differ from common language due to nuances of meaning specific to healthcare.  If you have questions or concerns  about this note, please schedule an appointment and we can discuss any concerns at your convenience. Alteration of the medical record cannot be guaranteed.            [1]   Current Outpatient Medications   Medication Sig Dispense Refill    Albuterol (PROAIR HFA, PROVENTIL HFA, VENTOLIN HFA) 90 mcg/actuation inhaler INHALE 1 TO 2 PUFFS BY MOUTH EVERY 6 HOURS AS NEEDED FOR WHEEZING 8.5 g 0    Amlodipine (NORVASC) 10 mg Tablet Take 1 tablet by mouth every day. 90 tablet 3    Atorvastatin (LIPITOR) 20 mg Tablet Take 1 tablet by mouth every day at bedtime. 90 tablet 3    Blood Sugar Diagnostic (ONETOUCH ULTRA TEST) Strips Use to test blood glucose 2x/day 100 strip 11    CloNIDine (CATAPRES) 0.1 mg Tablet TAKE 2 TABLETS BY MOUTH EVRY DAY AT BEDTIME. 120 tablet 0    Clopidogrel (PLAVIX) 75 mg Tablet Take 1 tablet by mouth every morning. 90 tablet 3    Diclofenac (VOLTAREN ARTHRITIS PAIN) 1 % Gel Apply to the affected area 4 times daily. 100 g 1    Duloxetine (CYMBALTA) 30 mg Delayed Release Capsule Take 1 capsule by mouth every day. 30 capsule 11    Hydrocortisone (ANUSOL-HC) 25 mg Suppository Insert 1 suppository into the rectum every 12 hours. 12 suppository 0    Losartan (COZAAR) 100 mg tablet Take 1 tablet by mouth every day. 90 tablet 3    Metformin (GLUCOPHAGE) 1,000 mg tablet Take 1 tablet by mouth once daily with a meal. 90 tablet 3    Metoclopramide (REGLAN) 5 mg Tablet Take 1 tablet  by mouth once daily if needed (for nausea).      Naloxone (NARCAN) 4 mg/actuation Nasal Spray Use only when an opioid overdose emergency is suspected.  Spray into one nostril upon signs of opioid overdose. Call 911.   Additional doses may be given every 2 to 3 minutes until emergency medical assistance arrives.  Alternate nostrils with each dose.  Use each nasal spray only one time. 1 each 1    Ondansetron (ZOFRAN-ODT) 8 mg disintegrating tablet Take 1 tablet by mouth every 8 hours if needed. 100 tablet 0    Pregabalin (LYRICA) 25 mg Capsule Take 1 capsule by mouth 2 times daily for 7 days, THEN 1 capsule every day at bedtime for 7 days. 21 capsule 0    Pregabalin (LYRICA) 50 mg Capsule Take 1 capsule by mouth 2 times daily for 7 days. 14 capsule 0    Pregabalin (LYRICA) 75 mg capsule Take 1 capsule by mouth 2 times daily for 7 days. 14 capsule 0    Triamcinolone (KENALOG) 0.1 % Cream Apply to the affected area 2 times daily. 30 g 1     No current facility-administered medications for this visit.

## 2023-06-30 NOTE — Telephone Encounter (Signed)
 Attempted to reach pt. LVM. When pt calls back please refer to provider's message and assist pt in making appointment.    [facility] message sent as well. Marland Kitchen

## 2023-07-02 ENCOUNTER — Other Ambulatory Visit: Payer: Self-pay | Admitting: Obstetrics & Gynecology

## 2023-07-02 ENCOUNTER — Encounter: Payer: Self-pay | Admitting: Obstetrics & Gynecology

## 2023-07-02 NOTE — Telephone Encounter (Signed)
 Refill Rx sent to preferred pharmacy 06/29/23

## 2023-07-03 NOTE — Progress Notes (Unsigned)
 HPI 04/06/2023:    Paula Daugherty is a pleasant 78yr old female here for first visit with me for retinal tear inferior OD referred by Dr. Ronnie Doss.    Pt reports vision is blurry in the morning. Denies eye pain, flashes or floaters. Using eye drops for dryness at least once a week.     Interval history 07/06/2023:  Paula Daugherty presents with a history of a retinal tear inferior OD, here today for a follow up visit. Today, patient reports stable VA OU since last visit without changes.     Past Medical History[1]    ROS:  Gen: No acute distress    Base Eye Exam       Visual Acuity (Snellen - Linear)         Right Left    Dist cc 20/30 20/25      Correction: Glasses              Tonometry ([facility], 9:29 AM)         Right Left    Pressure 12 12              Visual Fields (Counting fingers)         Right Left     Full Full              Extraocular Movement         Right Left     Full Full              Neuro/Psych       Oriented x3: Yes    Mood/Affect: Normal              Dilation       Right eye: 1.0% Mydriacyl, 2.5% Phenylephrine @ 9:29 AM                  Slit Lamp and Fundus Exam       External Exam         Right Left    External Normal Normal              Slit Lamp Exam         Right Left    Lids/Lashes Normal Normal    Conjunctiva/Sclera White and quiet White and quiet    Cornea Clear Clear    Anterior Chamber Deep and quiet Deep and quiet    [employer] dilated Round and reactive    Lens Posterior chamber intraocular lens Posterior chamber intraocular lens    Anterior Vitreous Posterior vitreous detachment Normal              Fundus Exam         Right Left    Disc Normal Undilated view, normal    C/D Ratio 0.25     Macula Normal, no DME     Vessels Normal, no hemes or mas     Periphery Retinal break inferior with trace pigment surround                     Data Reviewed:  OCT macula 04/06/2023:  OD: Normal foveal contour, no fluid    Optos 04/06/2023  OD: Inferior retinal tear with no obvious surrounding SRF     Optos  07/06/2023:  OD: Inferior retinal tear with no obvious surrounding SRF     Lab Results   Lab Name Value Date/Time    HGBA1C 7.3 (H) 06/17/2023 10:33 AM    HGBA1C 6.8 (  H) 03/11/2023 10:26 AM    HGBA1C 6.4 (H) 09/09/2022 08:25 AM    HGBA1C 6.6 (H) 08/30/2015 08:35 AM    HGBA1C 7.7 (H) 03/23/2015 09:04 AM    HGBA1C 6.9 (H) 12/06/2014 11:45 AM     ASSESSMENT:  Asymptomatic retinal tear inferior OD  - asymptomatic and pigmented    Non-insulin dependent diabetes without retinopathy OU  - no evidence of macular edema OU  - last known HgbA1c 7.3 (last 06/17/23)    PLAN:  - Observe  - Patient encouraged to contact clinic immediately if any new or worsening symptoms, such as worsening vision, ocular pain, new flashes or floaters, or curtain noted.  - Offered her an annual follow up or to see Dr. Richardson Chiquito annually, she prefers to see her annually.    Follow up as needed. Happy to see again in the future if needed.     Note: She is retired and from [employer]; she prefers [employer] compared to [place]    I confirm that I reviewed the patient's medical history and other pertinent data, and personally examined the patient with the ophthalmic exam findings documented here.     SCRIBE DISCLAIMER:   This note was scribed by Rodena Piety, SCRIBE, a trained medical scribe in the presence of Dr. Ramond Craver. M.D.  Electronically signed by Rodena Piety, SCRIBE, 07/06/2023 11:06 AM    PROVIDER DISCLAIMER:  This document serves as my personal record of services taken in my presence. It was created on 07/06/2023 on my behalf by the trained medical scribe listed above.     I have reviewed this document and agree that this note accurately reflects the history and exam findings, the patient care provided, and my medical decision making.     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

## 2023-07-06 ENCOUNTER — Ambulatory Visit: Payer: Medicare Other | Attending: OPHTHALMOLOGY | Admitting: OPHTHALMOLOGY

## 2023-07-06 DIAGNOSIS — H33311 Horseshoe tear of retina without detachment, right eye: Secondary | ICD-10-CM

## 2023-07-06 DIAGNOSIS — E119 Type 2 diabetes mellitus without complications: Secondary | ICD-10-CM | POA: Insufficient documentation

## 2023-07-12 NOTE — Progress Notes (Signed)
 Endocrinology Follow up clinic note:    Chief Complaint:  "I'm here to follow up on my diabetes."    HPI: Paula Daugherty is a 78yr old female who has a diagnosis of type 2 diabetes mellitus who presents to Endocrinology for follow up. Her history is as follows:  She was initially diagnosed with diabetes around 2000 on routine labs. She has a strong family history of diabetes so she wasn't surprised at this. She was started on Metformin around 2005 and then she was started on insulin in 2014. She was up to 64 units of Lantus at night. However, after making changes to her diet and lifestyle, she was taken off insulin in 2015. Basal insulin was added back to her regimen in 2018 and she was later able to discontinue this.      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     Interim History: She returns today for follow up. Her last visit with Endocrinology was on 03/18/2023. Since this visit, she has been dealing with low back and hip pain and has been seeing multiple providers for this and had many adjustments to her medications. She also has noted some itching and swelling in the palms of her hands and reports decreased physical activity.   Her diabetes has been overall stable.     Last eye exam: Has appointment in 07/2023     ROS:  All other systems were reviewed and are negatative except for pertinent positive and negative responses as documented in HPI.     Medications:  Medication reconciliation was performed today.   Medications Ordered Prior to Encounter[1]    I did review patient's past medical and family/social history, no changes noted.   PMH:  Past medical history was reviewed from problem list.   Problem List[2]    VITAL SIGNS:  BP (!) 144/73 (SITE: left arm, Orthostatic Position: sitting, Cuff Size: regular)   Pulse 60   Temp (!) 35.8 C (96.5 F) (Temporal)   Wt 74.8 kg (164 lb 14.5 oz)   LMP 05/06/1983   SpO2 98%   BMI 30.16 kg/m   Body mass index is 30.16  kg/m.    PHYSICAL EXAM:  General Appearance: healthy, alert, no distress, pleasant affect, cooperative.  Eyes:  conjunctivae and corneas clear. EOM's intact. sclerae normal.  Neck:  Neck supple.   Extremities:  no cyanosis, clubbing, or edema.  Skin:  Skin color, texture, turgor normal. No rashes or lesions.  Neuro: No tremor appreciated.   Mental Status: Appearance/Cooperation: in no apparent distress and well developed and well nourished  Eye Contact: normal  Speech: normal volume, rate, and pitch    LAB TESTS/STUDIES:   I personally reviewed the following  laboratory and/or imaging studies .   12/19/22 07:23 12/25/22 10:09 03/11/23 10:26 06/17/23 10:33   Sodium 143 141 141    Potassium 4.3 4.5 4.3    Chloride 106 105 105    Carbon Dioxide Total 22 23 24     Anion Gap 15 13 12     Urea Nitrogen, Blood (BUN) 26 (H) 27 (H) 22 (H)    Creatinine Blood 1.19 (H) 1.35 (H) 1.22 (H)    Glucose 142 (H) 108 132 (H)    Calcium 9.5 9.3 9.3    Protein 7.7      Albumin 4.0      Alkaline Phosphatase (ALP) 119      Aspartate Transaminase (AST) 21  Bilirubin Total 1.0      Alanine Transferase (ALT) 18      E-GFR Creatinine (Female) 48 41 46    Cholesterol  108     Triglyceride  147     LDL Cholesterol Calculation  26     HDL Cholesterol  53     Non-HDL Cholesterol  55     Total Cholesterol:HDL Ratio  2.0     Ferritin 48 33 24 21   Folate  >20.0     Hgb A1C   6.8 (H) 7.3 (H)   Hgb A1C,Glucose Est Avg   148 163   Vitamin D, 25 Hydroxy  50.1 (H)        06/17/23 10:33   White Blood Cell Count 4.7   Red Blood Cell Count 4.20   Hemoglobin 11.4 (L)   Hematocrit 35.4 (L)   MCV 84.3   MCH 27.2   MCHC 32.3   RDW 15.3 (H)   Platelet Count 178      06/17/23 10:33   Creatinine Spot Urine 155.2  155.2   Microalbumin Urine 34.1   Microalbumin/Creatinine Ratio 220     Thyroid ultrasound (09/25/2021):  FINDINGS:  Parenchymal echotexture: heterogeneous.  Right lobe: 3.9 x 1.4 x 1.9 cm (5.1 cc).  Nodules:  Subcentimeter nodule(s) present but none  that meet criteria for FNA or  follow up.  Left lobe: 4.1 x 1.7 x 2.1 cm (6.9 cc).  Nodules:  1. Mid lobe, 0.9 cm, solid or almost completely solid (2 pts),  hypoechoic (2 pts), wider-than-tall (0 pt), with smooth margins (0 pt).  Macrocalcifications: absent (0 pt). Peripheral rim calcification: absent (0  pt). Punctate echogenic foci: none (0 pt). TR4 - Moderately suspicious.  Subcentimeter nodule(s) present but none that meet criteria for FNA or  follow up.  Isthmus: 0.3 cm.  Nodules:  none.  No abnormal cervical lymph nodes.      IMPRESSION:  1. No nodules meet criteria for FNA or follow-up.    Impression: This is a 78yr old female with Diabetes Mellitus Type II who presents to Endocrinology for follow up. Overall, her glycemic control is at goal as evidenced by her most recent A1c of 7.3%. Review of her glucose trends on her meter download demonstrates that for the most part, this is well controlled and I have not recommended any changes to her medications today, however, she will continue to monitor her glucose closely and follow up with glycemic trends by mychart.       She also has a history of an adrenal nodule. Hormonal evaluation was last done and was normal in 04/2021.      DIABETES HISTORY  (-) h/o retinopathy.      (-) h/o microalbuminuria/nephropathy.  (-) h/o neuropathy.  (-) h/o Autonomic dysfunction  (-) h/o Gastroparesis  (-) h/o CAD  (-) h/o PVD     Last eye exam: f/up scheduled 07/2023  Last urine microalbumin: 06/2023, 220  Pneumovax: 12/2014, Prevnar 2018  Influenza vaccine: fall 2024  (-) ASA  (+) Statin  (+) ACE/ARB          Recommendations:  (E11.29) Type 2 diabetes mellitus with other diabetic kidney complication  (primary encounter diagnosis)  - Continue current dose metformin, will increase to BID dosing if glucose is persistently above goal  - Encouraged patient to continue close blood glucose monitoring, will notify me on glucose trends at home  - Last LDL at goal, continue statin, repeat  due 12/2023  -  Last microalbumin:creatinine ratio elevated, continue cozaar, repeat due 12/2023  - Up to date on screening eye exam, f/up as scheduled in 07/2023  - Repeat A1c prior to follow up appointment  - Discussed daily foot care    Approximately 20 minutes were spent with patient, greater than 50% of which was spent counseling the patient on diabetes management and on coordination of care.    Follow up in 3 months    If you have any questions, please do not hesitate to contact me at [phone].  Thank you for allowing me to participate in the care of this patient.    EDUCATION:  I educated/instructed the patient or caregiver regarding all aspects of the above stated plan of care.  The patient or caregiver indicated understanding.      [place] interpreter was not used.    Report electronically signed by:  Alonza Smoker, M.D.  Clinical Associate Professor  Department of Endocrinology              [1]   Current Outpatient Medications on File Prior to Visit   Medication Sig Dispense Refill    Albuterol (PROAIR HFA, PROVENTIL HFA, VENTOLIN HFA) 90 mcg/actuation inhaler INHALE 1 TO 2 PUFFS BY MOUTH EVERY 6 HOURS AS NEEDED FOR WHEEZING 8.5 g 0    Amlodipine (NORVASC) 10 mg Tablet Take 1 tablet by mouth every day. 90 tablet 3    Atorvastatin (LIPITOR) 20 mg Tablet Take 1 tablet by mouth every day at bedtime. 90 tablet 3    Blood Sugar Diagnostic (ONETOUCH ULTRA TEST) Strips Use to test blood glucose 2x/day 100 strip 11    Clopidogrel (PLAVIX) 75 mg Tablet Take 1 tablet by mouth every morning. 90 tablet 3    Diclofenac (VOLTAREN ARTHRITIS PAIN) 1 % Gel Apply to the affected area 4 times daily. 100 g 1    Hydrocortisone (ANUSOL-HC) 25 mg Suppository Insert 1 suppository into the rectum every 12 hours. 12 suppository 0    Losartan (COZAAR) 100 mg tablet Take 1 tablet by mouth every day. 90 tablet 3    Metformin (GLUCOPHAGE) 1,000 mg tablet Take 1 tablet by mouth once daily with a meal. 90 tablet 3    Metoclopramide (REGLAN)  5 mg Tablet Take 1 tablet by mouth once daily if needed (for nausea).      Naloxone (NARCAN) 4 mg/actuation Nasal Spray Use only when an opioid overdose emergency is suspected.  Spray into one nostril upon signs of opioid overdose. Call 911.   Additional doses may be given every 2 to 3 minutes until emergency medical assistance arrives.  Alternate nostrils with each dose.  Use each nasal spray only one time. 1 each 1    Ondansetron (ZOFRAN-ODT) 8 mg disintegrating tablet Take 1 tablet by mouth every 8 hours if needed. 100 tablet 0    Pregabalin (LYRICA) 25 mg Capsule Take 1 capsule by mouth 2 times daily for 7 days, THEN 1 capsule every day at bedtime for 7 days. 21 capsule 0    Pregabalin (LYRICA) 50 mg Capsule Take 1 capsule by mouth 2 times daily for 7 days. 14 capsule 0    Pregabalin (LYRICA) 75 mg capsule Take 1 capsule by mouth 2 times daily for 7 days. 14 capsule 0    Triamcinolone (KENALOG) 0.1 % Cream Apply to the affected area 2 times daily. 30 g 1     No current facility-administered medications on file prior to visit.   [2]   Patient Active  Problem List  Diagnosis    Diabetes (HCC)    Depression with anxiety    Stress at home    Hypertension    GERD (gastroesophageal reflux disease)    Hyperlipidemia with target LDL less than 70    Abnormal LFTs    Renal cyst ( 6.4 cm cyst left kidney)    Cough    Disorder of liver    Macroalbuminuric diabetic nephropathy (HCC)    History of actinic keratoses    Cataract    Ptosis of eyelid    Liver fibrosis    Adrenal nodule (HCC)    Medication Therapy Auth    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HCC)    Mixed conductive and sensorineural hearing loss of both ears    Neoplasm of uncertain behavior of skin    Abdominal pain, generalized    Nausea without vomiting    Asymptomatic microscopic hematuria    Subcutaneous nodule    Angina pectoris (HCC)    Mild episode of recurrent major depressive disorder (HCC)    NASH (nonalcoholic steatohepatitis)    Iron  deficiency anemia due to chronic blood loss    Kidney disorder

## 2023-07-14 NOTE — Telephone Encounter (Signed)
 2nd attempt to schedule IR CT/myelogram L-spine     Called and spoke with patient in regards to scheduling the IR CT/myelogram L-spine. She stated that she will not be moving forward with the IR CT/myelogram L-spine at this time. I let her know I would give one more follow up call and if she still declines we would inform the RMD that she is still not ready to schedule. Patient stated understanding.

## 2023-07-23 ENCOUNTER — Ambulatory Visit: Payer: Medicare Other | Attending: PAIN MANAGEMENT | Admitting: PAIN MANAGEMENT

## 2023-07-23 VITALS — BP 105/64 | HR 62 | Temp 98.2°F | Resp 18 | Ht 62.0 in | Wt 160.1 lb

## 2023-07-23 DIAGNOSIS — G5732 Lesion of lateral popliteal nerve, left lower limb: Secondary | ICD-10-CM | POA: Insufficient documentation

## 2023-07-23 DIAGNOSIS — M5417 Radiculopathy, lumbosacral region: Secondary | ICD-10-CM | POA: Insufficient documentation

## 2023-07-23 NOTE — Progress Notes (Deleted)
 [facility]  Department of Anesthesiology and Pain Medicine  [address]  [place], [place] [address]  Phone: [phone]    Fax: [phone]    Date: 07/23/2023    Re: Paula Daugherty  MR#: [id number]  Date of Birth: [birth year]  Age: 60yr    Primary care provider: Garth Bigness, MD  Requesting physician: Garth Bigness, *    Below is the summary of the visit with our mutual patient, Paula Daugherty. Thank you for your cooperation in her care.          ASSESSMENT           This is a patient with ***     No diagnosis found.     No orders of the defined types were placed in this encounter.                  RECOMMENDATIONS/TREATMENT PLAN     Imaging:  None at this time. 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     Interventional Therapy:  Based on the etiology of this patient's pain, no interventional procedure would be significantly helpful at this point.  Will Plan: *** at level: ***    Of Note: *patient has allergy to contrast     Medications:  No changes made on today's visit     Physical Rehabilitation:  Will refer to physical therapy     Psychiatric:  No referral to pain psychology at this time       DISPOSITION AFTER TODAY'S VISIT:     PRIORITY SCHEDULING: next available  ATTENDING: Dr. Lulu Riding  PROCEDURE: Left L5-S1 epidural steroid injection   IMAGING: fluoroscopy  STEROID: yes  SPECIAL INSTRUCTIONS: no special instructions  CONSENT SIGNED TODAY: Yes             History of Present Illness (HPI)     Paula Daugherty is a 78yr -old female with a chief complaint of low back pain that radiates to the left lower extremity. The pain goes into her left thigh, into her calf, and into her foot. Referred by ortho spine for consideration of ESI vs TFESI. Patient was accompanied by her husband who had an injection in the spine in the past. PMHx: Fibromyalgia. Allergy to contrast dye. Unable to get MRI given stapedectomies.  Patient hesitant about injection as she has a daughter who is paralyzed and is worried - discussed we can trial lyrica and medrol dosepak first while getting the ball rolling for an injection.     They were last seen on 04/23/2023 where a  Lumbar, L5-S1 left paramedian Interlaminar Epidural Steroid Injection with Fluoroscopy was performed. Additional recommendations included:  - Start Medrol Dospack  - Continue Lyrica, increase to 100mg  twice daily. Creatinine clearance = 46.     Since last visit, patient reports ***      Current Description of Symptoms:  Onset: between 3 and 6 months ago.  Timing: Constant   Severity:  8 out of 10  Quality:  cramping,pins and needles,sharp,numbness,cutting,pressure   Interferes with: daily life, chores, sleep   Modifying factors: Better with  nothing. Worse with  lying down,standing,sitting,walking.   The patient denies cauda equina and red flag symptoms     Current and prior pain medication regimen:   [x]  Opioids: Tramadol  [x]  Anticonvulsants: Gabapentin, Lyrica 50 mg twice daily   []  TCA/ SNRI: none   []  Muscle relaxants: none   [x]  NSAIDs: as needed  [  x] Acetaminophen: as needed  []  Benzodiazepines: none  []  Topicals: none   []  Other: none      Conservative therapies:   [x]  Physical Therapy (completed)  []  Physician Directed Home Exercise Plan   []  Acupuncture   []  Chiropractic Services   [x]  Heat/ice   []  TENS unit     [x]  Massage   []  Cognitive Behavioral Therapy   Exercise/physical activity the patient currently engages in: none     Pain Treatment History:  Is there ongoing legal action related to the pain problem?  No  Diabetic: A1C 6.8 03/11/2023  Creatinine/GFR: 1.22/46 on 03/11/2023  NSAIDs: yes  Anticoagulation: Current Anticoagulation Medications/Supplements  Clopidogrel (PLAVIX) 75 mg Tablet     Prior interventions:   - Lumbar L5-S1 LESI was on 04/23/2023   - Has had a knee injection in the past.     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 [employer]    Tobacco Use    Smoking status: Former       Current packs/day: 0.10       Average packs/day: 0.1 packs/day for 20.0 years (2.0 ttl pk-yrs)       Types: Cigarettes       Passive exposure: Never    Smokeless tobacco: Never    Tobacco comments:       quit 30 years ago   Vaping Use    Vaping status: Never Used   Substance and Sexual Activity    Alcohol use: Yes       Alcohol/week: 0.0 standard drinks of alcohol       Comment: rare    Drug use: No    Sexual activity: Yes       Partners: Male        From a mood standpoint, Paula Daugherty responses to the items below which she has experienced within the past 1 year have the potential to make pain symptoms worse.      [x]  Stress (eg, family, work, Actuary, etc.)  [x]  Frustration   [x]  Sleep disturbance  [x]  Increased irritability   [x]  Difficulty engaging in activities (eg, work, hobbies, chores, etc.)  [x]  Changes in mood  []  NONE OF THE ABOVE      Paula Daugherty has not been evaluated or treated by a psychiatrist, psychologist, or counselor.         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    She is not currently unemployed due to the painful condition.      Objective       ALLERGIES:  Allergies[1]    CURRENT MEDICATIONS:   Medications Taking[2]      PERTINENT LAB RESULTS  Labs: The lab/study results below were reviewed.    Lab Results   Component Value Date    HGBA1C 7.3 (H) 06/17/2023    A1CPOC 6.8 (Abnl) 11/01/2019    BUN 24 (H) 06/25/2023    CR 1.22 (H) 06/25/2023    WBC 4.7 06/17/2023    PLT 178 06/17/2023  INR 1.11 12/19/2022    NA 140 06/25/2023    K 4.8 06/25/2023    GLU 119 (H) 06/25/2023    AST 43 (H)  06/25/2023    ALT 27 06/25/2023     DIAGNOSTIC STUDIES AND MEDICAL REVIEW:    We reviewed and interpreted relevant radiology reports and reviewed relevant records from other services in the EMR.    01/23/2023 XRAY L-Spine:  FINDINGS:  Alignment: Minimal levocurvature which is likely positional.     Vertebrae: No fractures or destructive changes. Decreased bone density.  Multilevel intervertebral disc space narrowing and endplate degenerative  changes. Lower lumbar facet arthropathy.     Prevertebral and paraspinal soft tissues: Calcified aorta.     IMPRESSION:  1. Lumbar disc degeneration and facet joint arthropathy, more  pronounced in the lower lumbar spine.     CT L-Spine 02/07/2023:  FINDINGS:  Alignment: Straightened normal lumbar lordosis.     Vertebrae: No acute fractures or destructive changes. There is no  significant spinal canal stenosis.  At L2-3: Disc bulge and mild facet arthropathy with no significant spinal  canal or foraminal narrowing.  At L3-4 mild disc bulge, thickening of ligamentum flavum and facet  arthropathy. No significant spinal canal or significant foraminal stenosis.  At L4-L5, moderate disc bulge slightly asymmetric to left and suspected  small left subarticular protrusion and thickening of ligamenta flava with  mild to moderate spinal canal narrowing. Facet arthropathy with mild  inferior foraminal narrowing but no CT evidence of nerve root impingement.  At L5-S1, left asymmetric disc bulge extending to neural foramen, facet  arthropathy and thickening of ligamenta flava. Osteophyte and intervening  disc contributing left mild foraminal narrowing. Disc bulge and facet  arthropathy with right foraminal narrowing. No significant spinal canal  narrowing  Prevertebral and paraspinal soft tissues: Normal.  Atherosclerotic calcifications of aorta and iliac vessels.     IMPRESSION:  1. No acute fracture or subluxation of the lumbar spine.  2. Degenerative changes of lower lumbar spine most  pronounced at L4-L5  with suspected left subarticular protrusion on bulge contributing  multifactorial moderate spinal canal narrowing.  3. L5-S1 left greater than right mild foraminal narrowing    PHYSICAL EXAM     There were no vitals filed for this visit.  There is no height or weight on file to calculate BMI.     General: healthy, alert, no acute distress  CV: extremities well perfused  Respiratory: breathing comfortably on room air    MUSCULOSKELETAL:    Strength 5/5 throughout bilateral UE/ LE. Gait is non-antalgic.  Reflexes symmetric in bilateral UE/LE     Lumbar spine: lumbar spine ROM was full and pain-free. Facet load challenge nonprovocative, seated slump nonprovocative, SLR nonprovocative, FABER nonprovocative, TTP over lumbar paraspinals bilaterally, TTP on PSIS bilaterally, Gaenslen's provocative, thigh thrust provocative, compression test provocative, Gillet test provocative, pelvic obliquity present    Pelvic: modified [place] provocative, active SLR positive, obturator sign provocative    Hip: Hip ROM was full and pain-free. Log roll nonprovocative, hip scour nonprovocative, Stinchfield nonprovocative, FABER nonprovocative, FADIR nonprovocative, no TTP over the greater trochanteric region, Ober sign negative.    Cervical spine: Cervical spine range of motion was full and pain-free. Spurling's nonprovocative, TTP over cervical paraspinals bilaterally. Tinels negative at wrist and elbow bilaterally, Phalens negative.    Shoulder: TTP over AC joint, TTP along anterior and posterior GHJ, Shoulder ROM was full and pain-free. No scapulohumeral dyskinesis. Neers nonprovocative, [employer]negy, Empty can  test nonprovocative, Infraspinatus test nonprovocative, Hornblower test nonprovocative, Speeds test nonprovocative, O'Brien's test nonprovocative, Yergason's nonprovocative. Scarf is negative.     Knee: Range of motion is full and pain-free. Associated with crepitus. No effusion. No TTP along  medial or lateral joint lines. Varus and valgus stress test are negative.Patellar grind test nonprovocative, anterior drawer negative, posterior drawer negative, McMurrays negative, Apley nonprovocative, Thessaly nonprovocative.      MEDICAL DECISION MAKING    Records Reviewed:   Lab/study reports reviewed above were important and necessary because subsequent medical and treatment recommendations required review of the above lab/study reports  Images viewed/reviewed above were important and necessary because subsequent medical and treatment recommendations required review of the above image(s)  The reports by providers noted in the subjective portion of the history were reviewed. This was important and useful because the reviewed note clarified the reasons for the recommended treatment and the patient's history was corroborated in the reviewed note.    Review of the Risk of Comorbidities:  At this juncture, we believe that the patient's status adds moderate risk and complexity to our proposed evaluation and treatment.  Other considerations in this patient's management include comorbid conditions listed previously in the note.     ASSESSMENT AND RECOMMENDATIONS/TREATMENT PLAN:   Please see the beginning of the note.         SCRIBE STATEMENT  ***     PROVIDER DISCLAIMER:   ***             [1]   Allergies  Allergen Reactions    Sulfa (Sulfonamide Antibiotics) Rash    Contrast Dye [Radiopaque Agent] Hives     Reports has recently tolerated well    Hydrochlorothiazide Other-Reaction in Comments     Patient reports    Januvia [Sitagliptin] Other-Reaction in Comments     Patient reports    Keflex [Cephalexin] Abdominal Pain    Lyrica [Pregabalin] Other-Reaction in Comments     Patient reports    Omnipaque [Iohexol] Other-Reaction in Comments     Patient reports    Prozac [Fluoxetine Hcl] Other-Reaction in Comments     Patient reports    Topiramate Other-Reaction in Comments     Hairfall   [2]   No outpatient medications  have been marked as taking for the 07/23/23 encounter (Appointment) with Candace Gallus, MD.

## 2023-07-23 NOTE — Nursing Note (Signed)
 Patient name and DOB verified. Patient roomed and vital signs obtained medication. Allergies and pain level verified. Tobacco use history reviewed.

## 2023-07-23 NOTE — Progress Notes (Signed)
 [facility]  Department of Anesthesiology and Pain Medicine  [address]  [place], [place] [address]  Phone: [phone]    Fax: [phone]    Date: 07/23/2023    Re: Paula Daugherty  MR#: [id number]  Date of Birth: [birth year]  Age: 30yr    Primary care provider: Garth Bigness, MD  Requesting physician: Garth Bigness, *    Below is the summary of the visit with our mutual patient, Paula Daugherty. Thank you for your cooperation in her care.          ASSESSMENT         This is a patient with prior history of radiating low back pain with radicular symptoms and EMG evidence of left peroneal mononeuropathy (mild). She underwent lumbar ESI on 04/23/2023 and reports at least 50% improvement of her low back and radiating leg radiating pain.        ICD-10-CM    1. Lumbosacral radiculitis  M54.17 EPIDURAL STEROID INJECTIONS      2. Left peroneal mononeuropathy  G57.32            No orders of the defined types were placed in this encounter.                  RECOMMENDATIONS/TREATMENT PLAN     Imaging:  None at this time. 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     Interventional Therapy:  Will Plan: Repeat left L5-S1 LESI    Of Note: *patient has allergy to contrast     Medications:  No changes made on today's visit     Physical Rehabilitation:  Continue home exercise program    Psychiatric:  No referral to pain psychology at this time       DISPOSITION AFTER TODAY'S VISIT:      PRIORITY SCHEDULING: next available   ATTENDING: Dr. Lulu Riding   PROCEDURE: Left L5-S1 epidural steroid injection    IMAGING: fluoroscopy   STEROID: yes   SPECIAL INSTRUCTIONS: no special instructions   CONSENT SIGNED TODAY: Yes             History of Present Illness (HPI)     Paula Daugherty is a 78yr -old female with a chief complaint of low back pain that radiates to the left lower extremity. The pain goes into her left thigh, into her calf,  and into her foot. Referred by ortho spine for consideration of ESI vs TFESI. Patient was accompanied by her husband who had an injection in the spine in the past. PMHx: Fibromyalgia. Allergy to contrast dye. Unable to get MRI given stapedectomies. Patient hesitant about injection as she has a daughter who is paralyzed and is worried - discussed we can trial lyrica and medrol dosepak first while getting the ball rolling for an injection.     They were last seen on 04/23/2023 where a  Lumbar, L5-S1 left paramedian Interlaminar Epidural Steroid Injection with Fluoroscopy was performed. Additional recommendations included:  - Start Medrol Dospack  - Continue Lyrica, increase to 100mg  twice daily. Creatinine clearance = 46.     Since last visit, patient reports that she has had a significantly good response to her epidural. She notes at least 50% improvement that is still ongoing and that the pain in her left buttock continue to do well. She does note ongoing pain in her left latera calf and underwent EMG which showed left peroneal mononeuropathy. No new weakness or sensory disturbances.  Current Description of Symptoms:  Onset: between 3 and 6 months ago.  Timing: Constant   Severity:  8 out of 10  Quality:  cramping,pins and needles,sharp,numbness,cutting,pressure   Interferes with: daily life, chores, sleep   Modifying factors: Better with  nothing. Worse with  lying down,standing,sitting,walking.   The patient denies cauda equina and red flag symptoms     Current and prior pain medication regimen:   [x]  Opioids: Tramadol  [x]  Anticonvulsants: Gabapentin, Lyrica 50 mg twice daily   []  TCA/ SNRI: none   []  Muscle relaxants: none   [x]  NSAIDs: as needed  [x]  Acetaminophen: as needed  []  Benzodiazepines: none  []  Topicals: none   []  Other: none      Conservative therapies:   [x]  Physical Therapy (completed)  []  Physician Directed Home Exercise Plan   []  Acupuncture   []  Chiropractic Services   [x]  Heat/ice   []   TENS unit     [x]  Massage   []  Cognitive Behavioral Therapy   Exercise/physical activity the patient currently engages in: none     Pain Treatment History:  Is there ongoing legal action related to the pain problem?  No  Diabetic: A1C 6.8 03/11/2023  Creatinine/GFR: 1.22/46 on 03/11/2023  NSAIDs: yes  Anticoagulation: Current Anticoagulation Medications/Supplements  Clopidogrel (PLAVIX) 75 mg Tablet     Prior interventions:   - Lumbar L5-S1 LESI was on 04/23/2023   - Has had a knee injection in the past.     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 [employer]    Tobacco Use    Smoking status: Former       Current packs/day: 0.10       Average packs/day: 0.1 packs/day for 20.0 years (2.0 ttl pk-yrs)       Types: Cigarettes       Passive exposure: Never    Smokeless tobacco: Never    Tobacco comments:       quit 30 years ago   Vaping Use    Vaping status: Never Used   Substance and Sexual Activity    Alcohol use: Yes       Alcohol/week: 0.0 standard drinks of alcohol       Comment: rare    Drug use: No    Sexual activity: Yes       Partners: Male        From a mood standpoint, Paula Daugherty responses to the items below which she has experienced within the past 1 year have the potential to make pain symptoms worse.      [x]  Stress (eg, family, work, Actuary, etc.)  [x]  Frustration   [x]  Sleep disturbance  [x]  Increased irritability   [x]  Difficulty engaging in activities (eg, work, hobbies, chores, etc.)  [x]  Changes in mood  []  NONE OF THE ABOVE      Paula Daugherty has not been evaluated or treated by a psychiatrist, psychologist, or counselor.         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    She is  not currently unemployed due to the painful condition.      Objective       ALLERGIES:  Allergies[1]    CURRENT MEDICATIONS:   Medications Taking[2]      PERTINENT LAB RESULTS  Labs: The lab/study results below were reviewed.    Lab Results   Component Value Date    HGBA1C 7.3 (H) 06/17/2023    A1CPOC 6.8 (Abnl) 11/01/2019    BUN 24 (H) 06/25/2023    CR 1.22 (H) 06/25/2023    WBC 4.7 06/17/2023    PLT 178 06/17/2023    INR 1.11 12/19/2022    NA 140 06/25/2023    K 4.8 06/25/2023    GLU 119 (H) 06/25/2023    AST 43 (H) 06/25/2023    ALT 27 06/25/2023     DIAGNOSTIC STUDIES AND MEDICAL REVIEW:    We reviewed and interpreted relevant radiology reports and reviewed relevant records from other services in the EMR.    01/23/2023 XRAY L-Spine:  FINDINGS:  Alignment: Minimal levocurvature which is likely positional.     Vertebrae: No fractures or destructive changes. Decreased bone density.  Multilevel intervertebral disc space narrowing and endplate degenerative  changes. Lower lumbar facet arthropathy.     Prevertebral and paraspinal soft tissues: Calcified aorta.     IMPRESSION:  1. Lumbar disc degeneration and facet joint arthropathy, more  pronounced in the lower lumbar spine.     CT L-Spine 02/07/2023:  FINDINGS:  Alignment: Straightened normal lumbar lordosis.     Vertebrae: No acute fractures or destructive changes. There is no  significant spinal canal stenosis.  At L2-3: Disc bulge and mild facet arthropathy with no significant spinal  canal or foraminal narrowing.  At L3-4 mild disc bulge, thickening of ligamentum flavum and facet  arthropathy. No significant spinal canal or significant foraminal stenosis.  At L4-L5, moderate disc bulge slightly asymmetric to left and suspected  small left subarticular protrusion and thickening of ligamenta flava with  mild to moderate spinal canal narrowing. Facet arthropathy with mild  inferior foraminal narrowing but no CT  evidence of nerve root impingement.  At L5-S1, left asymmetric disc bulge extending to neural foramen, facet  arthropathy and thickening of ligamenta flava. Osteophyte and intervening  disc contributing left mild foraminal narrowing. Disc bulge and facet  arthropathy with right foraminal narrowing. No significant spinal canal  narrowing  Prevertebral and paraspinal soft tissues: Normal.  Atherosclerotic calcifications of aorta and iliac vessels.     IMPRESSION:  1. No acute fracture or subluxation of the lumbar spine.  2. Degenerative changes of lower lumbar spine most pronounced at L4-L5  with suspected left subarticular protrusion on bulge contributing  multifactorial moderate spinal canal narrowing.  3. L5-S1 left greater than right mild foraminal narrowing    PHYSICAL EXAM     Vitals:    07/23/23 1400   BP: 105/64   Pulse: 62   Resp: 18   Temp: 36.8 C (98.2 F)   TempSrc: Temporal   SpO2: 97%   Weight: 72.6 kg (160 lb 0.9 oz)   Height: 1.575 m (5\' 2" )     Body mass index is 29.27 kg/m.     General: healthy, alert, no acute distress  CV: extremities well perfused  Respiratory: breathing comfortably on room air    MUSCULOSKELETAL:    Strength 5/5 throughout bilateral LE. Gait is non-antalgic.  Reflexes symmetric in bilateral LE     Lumbar spine: Facet load challenge nonprovocative, seated slump provocative  on left, SLR provocative on left, FABER nonprovocative, no TTP over lumbar paraspinals bilaterally, no TTP on PSIS bilaterally    MEDICAL DECISION MAKING    Records Reviewed:   Lab/study reports reviewed above were important and necessary because subsequent medical and treatment recommendations required review of the above lab/study reports  Images viewed/reviewed above were important and necessary because subsequent medical and treatment recommendations required review of the above image(s)  The reports by providers noted in the subjective portion of the history were reviewed. This was important and useful  because the reviewed note clarified the reasons for the recommended treatment and the patient's history was corroborated in the reviewed note.    Review of the Risk of Comorbidities:  At this juncture, we believe that the patient's status adds moderate risk and complexity to our proposed evaluation and treatment.  Other considerations in this patient's management include comorbid conditions listed previously in the note.     ASSESSMENT AND RECOMMENDATIONS/TREATMENT PLAN:   Please see the beginning of the note.         - I saw and evaluated the patient with my attending physician, Dr. Lulu Riding.  - The patient was instructed and educated on all aspects of the plan of care.  The patient acknowledged the plan of care.    Rollene Fare, MD  Pain Fellow  Department of Anesthesiology & Pain Medicine             [1]   Allergies  Allergen Reactions    Sulfa (Sulfonamide Antibiotics) Rash    Contrast Dye [Radiopaque Agent] Hives     Reports has recently tolerated well    Hydrochlorothiazide Other-Reaction in Comments     Patient reports    Januvia [Sitagliptin] Other-Reaction in Comments     Patient reports    Keflex [Cephalexin] Abdominal Pain    Lyrica [Pregabalin] Other-Reaction in Comments     Patient reports    Omnipaque [Iohexol] Other-Reaction in Comments     Patient reports    Prozac [Fluoxetine Hcl] Other-Reaction in Comments     Patient reports    Topiramate Other-Reaction in Comments     Hairfall   [2]   Outpatient Medications Marked as Taking for the 07/23/23 encounter (Office Visit) with Candace Gallus, MD   Medication Sig Dispense Refill    Amlodipine (NORVASC) 10 mg Tablet Take 1 tablet by mouth every day. 90 tablet 3    Atorvastatin (LIPITOR) 20 mg Tablet Take 1 tablet by mouth every day at bedtime. 90 tablet 3    Blood Sugar Diagnostic (ONETOUCH ULTRA TEST) Strips Use to test blood glucose 2x/day 100 strip 11    CloNIDine (CATAPRES) 0.1 mg Tablet TAKE 2 TABLETS BY MOUTH EVRY DAY AT BEDTIME. 120 tablet 0     Clopidogrel (PLAVIX) 75 mg Tablet Take 1 tablet by mouth every morning. 90 tablet 3    Diclofenac (VOLTAREN ARTHRITIS PAIN) 1 % Gel Apply to the affected area 4 times daily. 100 g 1    Duloxetine (CYMBALTA) 30 mg Delayed Release Capsule Take 1 capsule by mouth every day. 30 capsule 11    Hydrocortisone (ANUSOL-HC) 25 mg Suppository Insert 1 suppository into the rectum every 12 hours. 12 suppository 0    Losartan (COZAAR) 100 mg tablet Take 1 tablet by mouth every day. 90 tablet 3    Metformin (GLUCOPHAGE) 1,000 mg tablet Take 1 tablet by mouth once daily with a meal. 90 tablet 3    Metoclopramide (REGLAN) 5 mg Tablet  Take 1 tablet by mouth once daily if needed (for nausea).      Naloxone (NARCAN) 4 mg/actuation Nasal Spray Use only when an opioid overdose emergency is suspected.  Spray into one nostril upon signs of opioid overdose. Call 911.   Additional doses may be given every 2 to 3 minutes until emergency medical assistance arrives.  Alternate nostrils with each dose.  Use each nasal spray only one time. 1 each 1    Ondansetron (ZOFRAN-ODT) 8 mg disintegrating tablet Take 1 tablet by mouth every 8 hours if needed. 100 tablet 0    Triamcinolone (KENALOG) 0.1 % Cream Apply to the affected area 2 times daily. 30 g 1

## 2023-07-23 NOTE — Progress Notes (Signed)
-   The patient was seen, evaluated, and care plan was developed with the pain fellow, Dr. Durene Cal, on the date indicated in the Pain fellow's note. I agree with the findings and plan as outlined in the Pain fellow's note.  - Total time I spent in care of this patient today (including face-to-face time with the patient, review of the medical records today both before and after the visit, charting; but excluding time spent on other billable services) was 25 minutes. This is non-overlapping time from any other health care professional time statement that may be included on this date of service.  - The patient was instructed and educated on all aspects of the plan of care.  The patient acknowledged the plan of care.    Candace Gallus MD  Attending Physician  Department of Anesthesiology & Pain Medicine

## 2023-07-23 NOTE — Patient Instructions (Signed)
 PAIN MANAGEMENT CLINIC  AFTER VISIT INSTRUCTIONS      Pain Clinic [phone] or [phone]: available during business hours, 8 am - 5 pm  On-call physician [phone] THIS IS A NUMERICAL PAGER -DO NOT LEAVE VOICE MESSAGE.  Physician is available after business hours, 7 days per week including holidays.      Today you attended a new patient consult or follow up appointment    Your next appointment to schedule is a procedure  VERY IMPORTANT  INSTRUCTIONS FOR YOUR FOLLOW UP APPOINTMENT  Please remember to arrive 15 minutes prior to your appointment time.  This will allow time for check in.  If you arrive after your scheduled appointment time your appointment could be rescheduled or your visit with the physician will be shortened.      NOTE:  If your Pain Physician ordered a procedure, lab, diagnostic study (x-ray, MRI, CT, ultrasound, EMG, EKG, etc.), or specialty referral for you today, please be aware that some insurance carriers may require authorization before they can be scheduled. If these cannot be scheduled at the time of discharge today, you will be contacted with the scheduled appointment.  If you are not contacted within 3-5 business days, please call our office at [phone].          Procedure To Schedule FOR YOUR Next Visit:   Epidural Steroid Injection-Lumbar    PLEASE NOTIFY STAFF IF YOU HAVE A PACEMAKER, AICD, SPINAL CORD STIMULATOR OR OTHER IMPLANTABLE DEVICE BEFORE YOUR NEXT APPOINTMENT.      NOTE: Please be aware of which location you are scheduled for your next appointment. The [facility] has 2 locations in [place]:   (1) the [facility] at the [facility] and   (2) the [facility] at the [employer] park.   If you have any questions regarding the location of your next appointment, please call our office:  [phone].            PLEASE FOLLOW ALL INSTRUCTIONS CAREFULLY  Instructions and medication holds may verify by procedure.  Please call to confirm the instructions specific to your procedure, [phone].        If you are SICK, it is not safe to do your procedure. Therefore, please call us as soon as possible to reschedule. Since we do keep a waiting list, your courtesy will allow [place] to schedule another patient. Please, call our office if you have any questions.    If you are NOT IN PAIN OR HAVE MINIMAL PAIN, please call to postpone your procedure (unless otherwise instructed by your Pain Physician).          MEDICATIONS    If your Pain Physician prescribes medications for you, please anticipate when you will require a refill.  It can take 1-3 business days to complete the process. Contact your pharmacy for medication refills. Your pharmacy will contact the Pain Clinic office with detailed medication information.         Driving When You Are Taking Medications    For most people, driving represents freedom, control and independence. Driving enables most people to get to the places they want or need to go. For many people, driving is important economically - some drive as part of their job or to get to and from work.   Driving is a complex skill. Our ability to drive or operate heavy or dangerous machinery safely can  be affected by changes in our physical, emotional and mental condition. The goal of this brochure is to help you and your health care professional talk about how your medications may affect your ability to drive safely.     How can medications affect my driving or ability to operate heavy or dangerous machinery?   People take medications for a variety of reasons. Those can include:          allergies          anxiety          colds          depression          diabetes          heart and cholesterol conditions          high blood  pressure          muscle spasms          pain          Parkinson's disease          schizophrenia   Medications may be prescribed by your doctor or purchased over-the-counter without a doctor's prescription. Many individuals also take herbal supplements. Some of these medications and supplements may cause a variety of reactions that may make it more difficult for you to drive a car or operate heavy or dangerous machinery safely. These reactions may include:          sleepiness          blurred vision          dizziness          slowed movement          fainting          inability to focus or pay attention          nausea   Often people take more than one medication at a time. The combination of different medications can cause problems for some people. This is especially true for older adults because they take more medications than any other age group. Due to changes in the body as people age, older adults are more prone to medication related problems. The more medications you take, the greater your risk that your medicines will affect your ability to drive safely. To help avoid problems, it is important that at least once a year you talk to your doctor or pharmacist about all the medications - both prescription and over-the-counter - you are taking. Also let your professional know what herbal supplements, if any, you are taking. Do this even if your medications and supplements are not currently causing you a problem.     Can I still drive or operate heavy or dangerous machinery safely if I am taking medications?     Yes, most people can drive or operate heavy or dangerous machinery safely if they are taking medications. It depends on the effect those medications - both prescription and over-the-counter - have on your coordination and mental function. In some cases you may not be aware of the effects. But, in many instances, your doctor can help to minimize the negative impact of your medications on your driving or  operate heavy or dangerous machinery in several ways. Your doctor may be able to:          adjust the dose;          adjust the timing of doses or when you take the medication;  add an exercise or nutrition program to lessen the need for medication; and          change medication to one that causes less drowsiness.     What can I do if I am taking medications?   Talk to your doctor honestly.   When your doctor prescribes a medicine for you, ask about side effects. How should you expect the medicine to affect your ability to drive or operate heavy or dangerous machinery? Remind your doctor of other medications - both prescription and over-the-counter - and herbal supplements you are taking, especially if you see more than one doctor. Talking honestly with your doctor also means telling the doctor if you are not taking all or any of the prescribed medication. Do not stop taking your medication unless your doctor tells you to.   Ask your doctor if you should drive - especially when you first take a medication.   Taking a new medication can cause you to react in a number of ways. It is recommended that you do not drive or operate heavy or dangerous machinery when you first start taking a new medication until you know how that drug affects you. You also need to be aware that some over-the-counter medicines and herbal supplements can make it difficult for you to drive or operate heavy or dangerous machinery safely.     Talk to your pharmacist.   Get to know your pharmacist. Ask the pharmacist to go over your medications with you and to remind you of effects they may have on your ability to drive or operate heavy or dangerous machinery safely. Be sure to request printed information about the side effects of any new medication. Remind your pharmacist of other medicines and herbal supplements you are taking. Pharmacists are available to answer questions wherever you get your medications. Many people buy medicines by  mail. Mail-order pharmacies have a toll-free number you can call and a pharmacist available to answer your questions about medications.   Monitor yourself.   Learn to know how your body reacts to the medications and supplements. Keep track of how you feel after you take the medication. For example, do you feel sleepy? Is your vision blurry? Do you feel weak and slow? When do these things happen?   Let your doctor and pharmacist know what is happening.   No matter what your reaction is to taking a medicine - good or bad - tell your doctor and pharmacist. Both prescription and over-the-counter medications are powerful-that's why they work. Each person is unique. Two people may respond differently to the same medicine. If you are experiencing side effects, the doctor needs to know that in order to adjust your medication. Your doctor can help you find medications that work best for you.     What if I have to cut back or give up driving?   You can keep your independence even if you have to cut back or give up on your driving due to your need to take medications. It may take planning ahead on your part, but it will get you to the places you want to go and the people you want to see. Consider:          rides with family and friends;          taxi cabs;          shuttle buses or vans;          public buses, Engineering geologist;  and          walking.   Also, senior centers and religious and other local service groups often offer transportation services for older adults in the community.     Thank you for choosing  [facility].     Intructions for your procedure;        Pregnant                                      If you are pregnant or think you may be pregnant, please notify our staff. Pregnancy can affect the ability to safely perform certain procedures.    Driver                                     You must have a driver accompany you when checking in for your procedure. Your driver must remain on site  until your discharge. While providing you and others with safe, quality care there may be delays that cannot be anticipated.  This means you and your driver may be at your appointment for 30 minutes to 2 hours.  When checking in and during any delays, our front desk staff would be happy to provide you and your driver with an estimate of the length of your appointment.       Transportation                                              You can use Engineer, civil (consulting), taxi or a Publishing copy ([place], Investment banker, corporate etc.) if you also have a responsible adult (other than the company driver) who can accompany you when checking in for your procedure and wait onsite until your discharge.                                      If you are planning to use medical transportation, independently or through your insurance, please contact our clinic at [phone] for specific scheduling arrangements and requirements.                                          Light rail and bus travel are NOT acceptable forms of transportation.    Fasting                 No alcohol containing beverages.      Nothing to eat 8 hours BEFORE your appointment:  This means do NOT eat solid food, broth, candy, swallowed gum, milk/creamer or juice with pulp                                       You may have clear liquids up to 2 hours BEFORE your                      appointment: This means you can have water, clear juice, carbonated beverages, sports/electrolyte drinks, black coffee or tea (NO CREAMER/MILK),  plain Jell-O, chewing gum.                                      NO CHEWING GUM 2 HOURS BEFORE PROCEDURE.                                                                                                                                 When it is 2 hours BEFORE your appointment ALL food, drink, candy and chewing gum is restricted      If you are diabetic, contact your treating  physician for instructions on whether you need to adjust your diabetes medications while fasting    Medications                                    If you take blood pressure medication, take it as you usually do.  While fasting for the procedure, you can take it with a sip of water AT LEAST 2 HOURS PRIOR TO APPOINTMENT.       If you take NSAIDs, more than 81mg  of aspirin per day, other blood thinners or dietary supplements, please carefully review the attached medication list to see when it must be stopped.  If you are prescribed aspirin or blood thinners to prevent stroke, clotting etc. do NOT stop this medication without FIRST getting approval from your treating physician AND speaking to a Pain Management nurse.     If you are diabetic, contact your treating physician for instructions on whether you need to adjust your diabetes medications while fasting.                                         Antibiotics                              If you are currently taking antibiotics for an infection, you must complete the entire prescription 7 days or more prior to your scheduled procedure. You must not have any signs or symptoms of infection during the 7 days before your appointment. If you are on antibiotics less than 7 days before your scheduled procedure or become ill, please contact our clinic [phone]      Surgery and   Steroids:   If you are scheduled or plan on having any type of surgery within 3 months BEFORE or AFTER your scheduled injection appointment, please let your Pain Management provider AND surgeon know about this. Why is this important:  In the event that steroids are used for your injection, steroids can potentially affect wound and bone healing. Depending on the type of surgery being performed, your surgeon  may  not want you to be exposed to steroids within a specified period leading up to or following your surgery.     If you have received oral or injected steroids recently or are planning on receiving steroids from another provider, please let your Pain Management provider know in order to plan your care appropriately.  Why is this important: Excessive amounts of steroids given repeatedly within a short amount of time can be harmful to your body.      Fever, Chills,   Or Rash                            If you experience any fevers, chills, rash, illness or open wounds one week prior to your appointment, please call the Pain Clinic.       Traveling                                        We recommend you not travel out of town for 4 days after your procedure. Why is this important: In the rare event you have a concern or possible complication it will be easy for your Pain Management physician to see you.              If you have any questions or concerns please contact the Pain Clinic: [phone]        Pain Management has multiple locations.  Where are you scheduled?    Pain Management Clinic                                          Spine [facility]  [facility]                      [address]  [address], [address]                                        [place], [place] [address]  [phone]                                                          [phone]                [phone]                                                           [phone]

## 2023-07-30 ENCOUNTER — Telehealth: Payer: Self-pay | Admitting: PAIN MANAGEMENT

## 2023-07-30 NOTE — Telephone Encounter (Signed)
 Dear  Dr.  Lulu Riding :    [name] was identified by name, date of birth, and mailing address.    The caller is (relationship): self    Phone number (Company Name/Office): [phone]     Pharmacy (location selected): N/A    Reason for Call:     -Pt scheduled for LESI on 08/25/23. Pt is taking Plavix. Informed pt of Plavix 7 day hold.     -PCP Dr. Delford Field is prescribing office for Plavix. Pt prescribed Plavix last year June 2024 in ER when pt had stroke.    Action Requested: Please advise if risk assessment is needed for Plavix    Thank you,    Note Electronically Signed By:  Genevie Cheshire, [facility]

## 2023-07-30 NOTE — Telephone Encounter (Signed)
 Dr Lulu Riding, please initiate a shared risk assessment for Plavix. Dr Delford Field, Richelle Ito is the prescriber.  MA team , please assist

## 2023-08-03 NOTE — Telephone Encounter (Signed)
 [name] has been referred for an elective spinal injection procedure, but records indicate she is on an anticoagulant medication(s).     I have explained to [name] that because remaining on an anticoagulant during the time of a spinal injection is associated with a greater risk of temporary or permanent nerve injury related to neuraxial bleeding, she will need to discontinue her anticoagulant for the duration specified below.  In addition, I have explained to [name] that temporarily discontinuing her anticoagulant for the duration specified below carries a risk of causing temporary or permanent cardiovascular and/or neurological sequale.     Clopidogrel (Plavix) - 7 day hold BEFORE the procedure + restarting the anticoagulant 1 day AFTER the injection.      [name] understands she can ask her prescribing clinician about more specific risks associated with stopping her anticoagulant for this elective spinal injection. Based on the information I have provided, [name] does want to proceed.     Advised patient that "any time you hold your blood thinning medication you may be at a higher risk to develop a blood clot, and thus have a stroke or heart attack. If at any time you are experiencing symptoms such as trouble speaking or swallowing, changes in your vision, facial drooping, trouble moving one side of your body, mental confusion, severe headache, chest pain, or shortness of breath, you should immediately call 911."      Pt already scheduled.

## 2023-08-03 NOTE — Telephone Encounter (Signed)
 Dear Dr. Delford Field,    Paula Daugherty is a candidate for a spinal injection.     Our records indicate she is being prescribed anticoagulant medication(s) -- see below.    Based on national Pain society guidelines, our group of Pain physicians has determined that Paula Daugherty needs to hold her medication for the amount of time stipulated below in order to safely proceed with her spinal injection. We have also included the most recent basic metabolic panel (BMP) in the event the half-life of the medication is influenced by renal clearance.    Lab Results   Lab Name Value Date/Time    NA 140 06/25/2023 11:32 AM    NA 142 08/30/2015 08:35 AM    K 4.8 06/25/2023 11:32 AM    K 4.0 08/30/2015 08:35 AM    CL 101 06/25/2023 11:32 AM    CL 104 08/30/2015 08:35 AM    CO2 25 06/25/2023 11:32 AM    CO2 22 (L) 08/30/2015 08:35 AM    BUN 24 (H) 06/25/2023 11:32 AM    BUN 19 08/30/2015 08:35 AM    CR 1.22 (H) 06/25/2023 11:32 AM    CR 0.88 08/30/2015 08:35 AM    GLU 119 (H) 06/25/2023 11:32 AM    GLU 139 (H) 08/30/2015 08:35 AM       Clopidogrel (Plavix) - 7 day hold BEFORE the procedure + restarting the anticoagulant 1 day AFTER the injection.       Please reply by EMR to the sender of this message Gaynelle Arabian, [place]) by responding in your own words or by copying and pasting one of the choices below in your response.    YES - my patient can stop the anticoagulant(s) for the # of days stipulated.    NO - stopping the anticoagulant(s) is NOT APPROVED for my patient.    If you feel it is high risk to hold the anticoagulant/antiplatelet medication for the duration requested above, we will advise Paula Daugherty that a spinal injection is contraindicated at this time.     Paula Daugherty procedure appointment is pending until we receive your response.  Please do not hesitate to contact me if you have any questions.      Sincerely,  Dr. Candace Gallus  Department of Anesthesiology & Pain Medicine    Please reply to this message via EMR

## 2023-08-07 ENCOUNTER — Ambulatory Visit: Payer: Medicare Other | Admitting: Student in an Organized Health Care Education/Training Program

## 2023-08-07 DIAGNOSIS — M5417 Radiculopathy, lumbosacral region: Secondary | ICD-10-CM

## 2023-08-07 DIAGNOSIS — K7460 Unspecified cirrhosis of liver: Secondary | ICD-10-CM

## 2023-08-07 DIAGNOSIS — G8929 Other chronic pain: Secondary | ICD-10-CM

## 2023-08-07 DIAGNOSIS — G5732 Lesion of lateral popliteal nerve, left lower limb: Secondary | ICD-10-CM

## 2023-08-07 DIAGNOSIS — M5442 Lumbago with sciatica, left side: Secondary | ICD-10-CM

## 2023-08-07 MED ORDER — DULOXETINE 30 MG CAPSULE,DELAYED RELEASE
30.0000 mg | DELAYED_RELEASE_CAPSULE | Freq: Every day | ORAL | 3 refills | Status: DC
Start: 2023-08-07 — End: 2023-09-30

## 2023-08-07 NOTE — Progress Notes (Signed)
 Video Visit Note    I performed this clinical encounter by utilizing a real time telehealth video connection between my location and the patient's location. I obtained consent from the patient to perform this clinical encounter utilizing video and prepared the patient by answering any questions they had about the telehealth interaction.     Patient Location:  Other (Home)      Video call face-to-face discussion time: 43m 50s      I spent a total of 20 minutes today on this patient's visit.      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 Improvement noted.  No cane needed, can stand unaided. Dr. Berlinda Last recommended steroid injection.  - Proceed with scheduled steroid injection on April 22.  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  Taking Cymbalta 30 mg. Reduced arthritis Tylenol use. Considering long-term Cymbalta use. Intermittent sleepiness possibly due to Cymbalta or magnesium.  - Prescribe Cymbalta 30 mg with a 90-day supply.  - Monitor sleepiness and adjust treatment as necessary.  - Lyrica weaned off, removed from [facility]    Medication Management  No longer on Lyrica. Confirmed discontinuation and will update medication list.  - Remove Lyrica from medication list.    Cirrhosis of liver without ascites  Chronic, stable. Due for liver cancer screening.  Ultrasound ordered.    Follow-up  Informed about husband's upcoming echocardiogram and blood work.  - Encourage sending a MyChart note once husband's test results are available.       Modified Medications    Modified Medication Previous Medication    DULOXETINE (CYMBALTA) 30 MG DELAYED RELEASE  CAPSULE Duloxetine (CYMBALTA) 30 mg Delayed Release Capsule       Take 1 capsule by mouth every day.    Take 1 capsule by mouth every day.        SUBJECTIVE      Paula Daugherty is a 78yr old female.    History of Present Illness  Paula Daugherty, a patient with chronic leg pain, reports improvement in her symptoms. She has been under the care of Dr. Berlinda Last, who recommended another steroid injection scheduled for April 22nd. An EMG identified an issue in her calf, where she was experiencing significant pain.  Despite this, the patient reports reduced pain and decreased use of arthritis Tylenol, only taking it when necessary. She continues to take Cymbalta and has inquired about the possibility of a 90-day supply. She also takes magnesium, but reports inconsistent sleep patterns. She no longer uses a cane and can stand in the shower without assistance.       OBJECTIVE      Her vitals were not taken for this visit.         Physical Exam         Gen: appears well, NAD  Chest: non labored breathing, speaking in full sentences   Psych: normal mood and affect     Results             I obtained verbal consent from the patient to use AI ambient technology to transcribe the interactions between the patient and myself during  the clinical encounter.    Today's visit has complexity inherent to evaluation and management associated with medical care services that serve as the continuing focal point for all needed health care services.      Electronically signed by:  Garth Bigness, MD  Associate Physician  [facility]

## 2023-08-10 ENCOUNTER — Encounter: Payer: Self-pay | Admitting: PAIN MANAGEMENT

## 2023-08-10 NOTE — Progress Notes (Signed)
 Message reviewed, determined further review/action is needed from Physician.

## 2023-08-10 NOTE — Progress Notes (Signed)
 FYI to physician(s) only. Unless other plan of care needed, NO ACTION REQUIRED.     Plan is for LES on 4/22.

## 2023-08-12 ENCOUNTER — Encounter: Payer: Self-pay | Admitting: GASTROENTEROLOGY

## 2023-08-12 ENCOUNTER — Ambulatory Visit: Payer: Medicare Other | Attending: GASTROENTEROLOGY | Admitting: GASTROENTEROLOGY

## 2023-08-12 VITALS — BP 129/66 | HR 62 | Temp 95.9°F | Resp 14 | Ht 62.0 in | Wt 154.3 lb

## 2023-08-12 DIAGNOSIS — E889 Metabolic disorder, unspecified: Secondary | ICD-10-CM | POA: Insufficient documentation

## 2023-08-12 DIAGNOSIS — K760 Fatty (change of) liver, not elsewhere classified: Secondary | ICD-10-CM | POA: Insufficient documentation

## 2023-08-12 DIAGNOSIS — Z794 Long term (current) use of insulin: Secondary | ICD-10-CM | POA: Insufficient documentation

## 2023-08-12 DIAGNOSIS — E1129 Type 2 diabetes mellitus with other diabetic kidney complication: Secondary | ICD-10-CM | POA: Insufficient documentation

## 2023-08-12 DIAGNOSIS — K7460 Unspecified cirrhosis of liver: Secondary | ICD-10-CM | POA: Insufficient documentation

## 2023-08-12 MED ORDER — ONDANSETRON 8 MG DISINTEGRATING TABLET: 8.0000 mg | DISINTEGRATING_TABLET | Freq: Three times a day (TID) | ORAL | 2 refills | Status: DC | PRN

## 2023-08-12 NOTE — Patient Instructions (Signed)
 We will start doing liver cancer (hepatocellular carcinoma) surveillance with an ultrasound and AFP every 6 months.    No indication for an upper endoscopy at this time, but if you become anemic or not black stool we will repeat it.     Continue with diabetes management.

## 2023-08-12 NOTE — Progress Notes (Signed)
 OUTPATIENT HEPATOLOGY CLINIC FOLLOW UP    NAME: Paula Daugherty  DOB: 1945/12/16         ASSESSMENT AND PLAN:  1. Cirrhosis of liver without ascites, unspecified hepatic cirrhosis type (HCC)  No signs of clincally significant portal hypertension. Fibrosis on borderline of cirrhosis but reasonable to start hepatocellular carcinoma surveillance. No indication for starting NSBB or screening for varices at this time.    - Labs every 3 months   - [place] and AFP every 6 months   - Check HAV immunity. Vaccinate if non-immune.   - Return to clinic in 6 months    2. Metabolic dysfunction-associated steatotic liver disease (MASLD)  Lack of steatosis on FibroScan is surprising but can be seen in later stages of disease. Will rule out other cause of chronic liver disease. Discussed possibility of resmiteron and GLP-1 agonists. Likely too advanced for the former and chronic nausea makes if unlikely she could tolerate the latter.   - Complete chronic liver disease work up    - Continue mitigating risks by optimizing diabetes treatment, weight loss, and avoidance of alcohol             Murl Zogg L. Vedant Shehadeh, MD  Professor    I spent a total of 40 minutes on the day of the visit.    Today's visit has complexity inherent to evaluation and management associated with medical care services that are part of ongoing care related to a patient's single, serious condition or a complex condition, which is cirrhosis.         Chief Complaint: MASH Cirrhosis    History of Present Illness:  Paula Daugherty is a 78yr old female seen in follow up for MASH cirrhosis. Previously followed by Dr. Manus Gunning with F3 fibrosis by FibroScan in 2017 (LSM 12.9 kPa, 18% IQR). FibroScan 01/19/2023 consistent with F4 (15.2 kPa, 16% IQR) with low CAP (240 dB/m consistent with S0). No evidence of clincally significant portal hypertension on imaging  or upper endoscopy.     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 Negative upper endoscopy, colonoscopy, and capsule endoscopy    No history of hepatic decompensation. Has had recent CVA.    Allergies: Allergies[1]    Medications  Medications Taking[2]    Past Medical History[3]    Past Surgical History[4]   Family History[5]  Social History[6]  Physical Exam  Constitutional:       Appearance: Normal appearance. She is not ill-appearing.   HENT:      Head: Normocephalic and atraumatic.   Eyes:      General: No scleral icterus.  Cardiovascular:      Rate and Rhythm: Normal rate.   Pulmonary:      Effort: Pulmonary effort is normal. No respiratory distress.   Skin:     Coloration: Skin is not jaundiced.   Neurological:      Mental Status: She is alert.         Lab Results   Lab Name Value Date/Time    WBC 4.7 06/17/2023 10:33 AM    WBC 8.9 08/30/2015 08:35 AM    HGB 11.4 (L) 06/17/2023 10:33 AM    HGB 14.4 08/30/2015 08:35 AM    HCT 35.4 (L) 06/17/2023 10:33 AM    HCT 44.6 08/30/2015 08:35 AM    PLT 178 06/17/2023 10:33 AM    PLT 217 08/30/2015 08:35 AM  Lab Results   Lab Name Value Date/Time    NA 140 06/25/2023 11:32 AM    NA 142 08/30/2015 08:35 AM    K 4.8 06/25/2023 11:32 AM    K 4.0 08/30/2015 08:35 AM    CL 101 06/25/2023 11:32 AM    CL 104 08/30/2015 08:35 AM    CO2 25 06/25/2023 11:32 AM    CO2 22 (L) 08/30/2015 08:35 AM    BUN 24 (H) 06/25/2023 11:32 AM    BUN 19 08/30/2015 08:35 AM    CR 1.22 (H) 06/25/2023 11:32 AM    CR 0.88 08/30/2015 08:35 AM    GLU 119 (H) 06/25/2023 11:32 AM    GLU 139 (H) 08/30/2015 08:35 AM     Lab Results   Lab Name Value Date/Time    AST 43 (H) 06/25/2023 11:32 AM    AST 71 (H) 08/30/2015 08:35 AM    ALT 27 06/25/2023 11:32 AM    ALT 69 (H) 08/30/2015 08:35 AM    ALP 136 (H) 06/25/2023 11:32 AM    ALP 104 08/30/2015 08:35 AM    ALB 4.2 06/25/2023 11:32 AM    ALB 4.2 08/30/2015 08:35 AM     TP 7.9 06/25/2023 11:32 AM    TP 8.5 (H) 08/30/2015 08:35 AM    TBIL 0.8 06/25/2023 11:32 AM    TBIL 0.9 08/30/2015 08:35 AM     Lab Results   Component Value Date    HGBA1C 7.3 (H) 06/17/2023       Viral Hepatitis Serologies  Lab Results   Component Value Date    HBSAG Nonreactive 12/08/2013    HEPCABSCR Nonreactive 06/13/2022        Autoimmune Liver Disease Antibodies  No results found for: "SMOOTHMUSCAB", "ANA", "IGG", "ANTIMITOABSC", "IGG4", "IGM"    Genetic Liver Disease Screen  Lab Results   Component Value Date    IRONSAT 16.9 06/25/2023    FRTN 21 06/17/2023       FIB4  Fib-4 Score: 3.49    The below cut-offs are for NASH patients only; cut-offs are different for other liver diseases.    <1.3: very low likelihood of liver fibrosis  1.3 -2.67: intermediate risk for liver fibrosis.   >2.67: risk for advanced liver fibrosis.     MELD  MELD 3.0: 10 at 12/19/2022  7:23 AM  MELD-Na: 9 at 12/19/2022  7:23 AM  Calculated from:  Serum Creatinine: 1.19 mg/dL at 3/32/9518  8:41 AM  Serum Sodium: 143 mmol/L (Using max of 137 mmol/L) at 12/19/2022  7:23 AM  Total Bilirubin: 1 mg/dL at 6/60/6301  6:01 AM  Serum Albumin: 4 g/dL (Using max of 3.5 g/dL) at 0/93/2355  7:32 AM  INR(ratio): 1.11 at 12/19/2022  7:23 AM  Age at listing (hypothetical): 43 years  Sex: Female at 12/19/2022  7:23 AM      IMAGING (I personally reviewed and interpreted the following imaging studies)  As noted above    PROCEDURES  As noted above.            [1]   Allergies  Allergen Reactions    Sulfa (Sulfonamide Antibiotics) Rash    Contrast Dye [Radiopaque Agent] Hives     Reports has recently tolerated well    Hydrochlorothiazide Other-Reaction in Comments     Patient reports    Januvia [Sitagliptin] Other-Reaction in Comments     Patient reports    Keflex [Cephalexin] Abdominal Pain    Lyrica [Pregabalin] Other-Reaction in Comments  Patient reports    Omnipaque [Iohexol] Other-Reaction in Comments     Patient reports    Prozac [Fluoxetine Hcl]  Other-Reaction in Comments     Patient reports    Topiramate Other-Reaction in Comments     Hairfall   [2]   Outpatient Medications Marked as Taking for the 08/12/23 encounter (Office Visit) with Jahnia Hewes, Clyda Greener, MD   Medication Sig Dispense Refill    Amlodipine (NORVASC) 10 mg Tablet Take 1 tablet by mouth every day. 90 tablet 3    Atorvastatin (LIPITOR) 20 mg Tablet Take 1 tablet by mouth every day at bedtime. 90 tablet 3    CloNIDine (CATAPRES) 0.1 mg Tablet TAKE 2 TABLETS BY MOUTH EVRY DAY AT BEDTIME. 120 tablet 0    Clopidogrel (PLAVIX) 75 mg Tablet Take 1 tablet by mouth every morning. 90 tablet 3    Duloxetine (CYMBALTA) 30 mg Delayed Release Capsule Take 1 capsule by mouth every day. 90 capsule 3    Losartan (COZAAR) 100 mg tablet Take 1 tablet by mouth every day. 90 tablet 3    Metformin (GLUCOPHAGE) 1,000 mg tablet Take 1 tablet by mouth once daily with a meal. 90 tablet 3    Ondansetron (ZOFRAN-ODT) 8 mg disintegrating tablet Take 1 tablet by mouth every 8 hours if needed. 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   [4]   Past Surgical History:  Procedure Laterality Date    BIOFEEDBACK PERI/URO/RECTAL  10/21/2017    session #1: N Score = 5.4    BIOFEEDBACK PERI/URO/RECTAL  11/04/2017    session #2    BIOFEEDBACK PERI/URO/RECTAL  11/18/2017    Session #3    BIOFEEDBACK PERI/URO/RECTAL  12/02/2017    Session #4    BIOFEEDBACK PERI/URO/RECTAL  12/30/2017    Session #5     BIOFEEDBACK PERI/URO/RECTAL  03/23/2018    Session #6    CAPSULE ENDOSCOPY  01/27/2018    CAPSULE ENDOSCOPY  01/29/2018         CAPSULE ENDOSCOPY  12/23/2021    wnl    COLONOSCOPY  01/04/2014    polyp--TA and erosion; hemorrhoids; tics; repeat x 3 yrs    COLONOSCOPY  12/16/2017    TA; repeat in 3 yrs    COLONOSCOPY  04/05/2021    polyps--path pending; hemorrhoids    EGD  04/2020    gastritis    EGD  04/08/2019    biopsy path--pending    EGD  04/05/2021    biopsies at tattoo--path pending    [facility] BIOFEEDBACK PERI/URO/RECTAL  05/02/2018         HC COLONOSCOPY,REMV LESN,SNARE  12/16/2017    MAMMOPLASTY, REDUCTION  1985    MANOMETRY  09/02/2017  work on constipation; kegels and biofeedback; proceed w/colonoscopy     PHACOEMULSIFICATION, CATARACT Right 02/12/2015    with IOL    PR ARTHROSCOPY KNEE DIAGNOSTIC W/WO SYNOVIAL BX SPX  1980s    Knee arthroscopy    PR ESOPHAGOGASTRODUODENOSCOPY TRANSORAL DIAGNOSTIC  02/12/2016    EGD--polyp--path pending; antral gastritis; no varices     PR ESOPHAGOGASTRODUODENOSCOPY TRANSORAL DIAGNOSTIC  06/11/2016    EGD--biopsy at site of carcinoid tumor path pending     PR ESOPHAGOGASTRODUODENOSCOPY TRANSORAL DIAGNOSTIC      EGD--folds in duodenum--path pending     PR ESOPHAGOGASTRODUODENOSCOPY TRANSORAL DIAGNOSTIC  04/2017    EGD; repeat EGD in 1 year     PR ESOPHAGOGASTRODUODENOSCOPY TRANSORAL DIAGNOSTIC  04/20/2018    gastritis; nodule--path pending    PR LIG/TRNSXJ FLP TUBE ABDL/VAG APPR UNI/BI      Tubal ligation    PR TOTAL ABDOMINAL HYSTERECT W/WO RMVL TUBE OVARY  1980s    partial; no BSO    PR TYMPANIC MEMB RPR W/WO PREPJ PERFOR PATCH      Tympanoplasty    REPAIR, BLEPHAROPTOSIS Bilateral 08/06/2015    Blepharoplasty bilateral upper lids   [5]   Family History  Problem Relation Name Age of Onset    Diabetes Father      Heart Mother          MI at 64s     Non-contributory Brother      Non-contributory Brother     [6]   Social History  Tobacco Use    Smoking status: Former     Current  packs/day: 0.10     Average packs/day: 0.1 packs/day for 20.0 years (2.0 ttl pk-yrs)     Types: Cigarettes     Passive exposure: Never    Smokeless tobacco: Never    Tobacco comments:     quit 30 years ago   Vaping Use    Vaping status: Never Used   Substance Use Topics    Alcohol use: Yes     Alcohol/week: 0.0 standard drinks of alcohol     Comment: rare    Drug use: No

## 2023-08-12 NOTE — Progress Notes (Signed)
 Patient is in room #2    Patient ID x 2. Vital signs taken, screened for pain, allergies and preferred pharmacy verified.      Wilson Singer, Medical Assistant II

## 2023-08-17 ENCOUNTER — Ambulatory Visit: Payer: Medicare Other

## 2023-08-18 NOTE — Telephone Encounter (Signed)
 Patient ID [id number]    Called patient to go over PPI.  Patient stated she is not in pain at this time and decided to hold off on procedure for now.  Patient will call back at a later time to reschedule if/when the pain comes back.     Paula Daugherty  [place], [place]  [facility]  [phone]

## 2023-08-18 NOTE — Progress Notes (Signed)
 This encounter needs no further provider action, please close the encounter.

## 2023-08-19 ENCOUNTER — Ambulatory Visit: Payer: Medicare Other | Admitting: GASTROENTEROLOGY

## 2023-08-25 ENCOUNTER — Ambulatory Visit

## 2023-08-28 NOTE — Telephone Encounter (Signed)
 3rd and final attempt to schedule IR CT/Myelogram L-Spine    Called and spoke with patient about scheduling for the Myelogram. Patient declined stating that she has spoke with the MD and this is no longer needed.

## 2023-09-03 ENCOUNTER — Ambulatory Visit
Admission: RE | Admit: 2023-09-03 | Discharge: 2023-09-03 | Disposition: A | Discharge status: Home / Self Care | Source: Ambulatory Visit | Attending: Student in an Organized Health Care Education/Training Program | Admitting: Student in an Organized Health Care Education/Training Program

## 2023-09-03 ENCOUNTER — Ambulatory Visit: Payer: Self-pay | Admitting: Student in an Organized Health Care Education/Training Program

## 2023-09-03 ENCOUNTER — Telehealth: Payer: Self-pay

## 2023-09-03 DIAGNOSIS — N281 Cyst of kidney, acquired: Secondary | ICD-10-CM

## 2023-09-03 DIAGNOSIS — K824 Cholesterolosis of gallbladder: Secondary | ICD-10-CM

## 2023-09-03 DIAGNOSIS — K7460 Unspecified cirrhosis of liver: Secondary | ICD-10-CM

## 2023-09-03 NOTE — Telephone Encounter (Signed)
 Assisting in rescheduling upcoming patient pain management appts w/ Annemarie Barry, PharmDs, who is currently OOO.     Patient with upcoming 5/8 appt with TS for pain mgmt. Called patient to offer appt rescheduling with pain pharmD MK or BH. Patient reports is not on pain medications and does not have any pain medication questions or concerns. Patient reports established with pain provider Dr. Drucella Georgi for interventional management. Patient would like to cancel upcoming appt, which I assisted with. I encouraged patient to call [facility] if would like to re-connect with pain pharmacy services in future. Pt appreciative for phone call.

## 2023-09-03 NOTE — Telephone Encounter (Signed)
 Noted thank you

## 2023-09-10 ENCOUNTER — Ambulatory Visit

## 2023-09-11 ENCOUNTER — Encounter: Payer: Self-pay | Admitting: OBSTETRICS/GYN

## 2023-09-11 ENCOUNTER — Ambulatory Visit: Admitting: OBSTETRICS/GYN

## 2023-09-11 VITALS — BP 132/74 | HR 68 | Temp 98.0°F | Resp 14 | Wt 155.2 lb

## 2023-09-11 DIAGNOSIS — N951 Menopausal and female climacteric states: Secondary | ICD-10-CM

## 2023-09-11 DIAGNOSIS — R232 Flushing: Secondary | ICD-10-CM

## 2023-09-11 DIAGNOSIS — N83202 Unspecified ovarian cyst, left side: Secondary | ICD-10-CM

## 2023-09-11 MED ORDER — CLONIDINE HCL 0.3 MG TABLET
0.3000 mg | ORAL_TABLET | Freq: Every day | ORAL | 11 refills | Status: DC
Start: 2023-09-11 — End: 2023-11-04

## 2023-09-11 NOTE — Nursing Note (Signed)
 I obtained verbal consent from the patient to use AI ambient technology to transcribe the interactions during the clinical encounter.   Patient vitals checked, allergies verified, pharmacy verified and patient screened for pain. By Tereasa Felty, MAII

## 2023-09-11 NOTE — Progress Notes (Signed)
 ASSESSMENT & PLAN        Assessment & Plan  Hot flashes  Hot flashes returned despite clonidine  initially effective (0.2 mg qhs). Hormonal therapy contraindicated due to heart disease. Considered increasing clonidine , adjusting Cymbalta , or using gabapentin .  - Increase clonidine  to 0.3 mg at bedtime.  - Monitor blood pressure at home.  - Consider Cymbalta  dose adjustment with primary care provider.  - Retain gabapentin  as an alternative.    Ovarian cyst  Ovarian cyst decreased in size, not visible on ultrasound. No symptoms reported.  - Hold off on further ovarian cyst follow-up unless symptoms develop.            SUBJECTIVE      Paula Daugherty is a 78yr old female.    History of Present Illness  Paula Daugherty is a 78 year old female who presents for a medication refill and management of hot flashes. Referred by Dr. Alva Jewels for continuation of care and medication management.    She experiences recurrent hot flashes that disrupt her sleep, despite taking clonidine  for approximately a year. She takes two 0.1 mg tablets at night without side effects. Her past medical history includes a stroke last year with good recovery and no significant residual effects. An ovarian cyst was previously monitored and found to be smaller, with no current symptoms. She has cysts on both kidneys, which are not causing concern. Her medication regimen includes Cymbalta  for pain management. She has a history of sciatic pain, previously managed with gabapentin  and opioids, but she has weaned off these medications after a steroid injection.       OBJECTIVE      Her weight is 70.4 kg (155 lb 3.3 oz). Her temporal temperature is 36.7 C (98 F). Her blood pressure is 132/74 and her pulse is 68. Her respiration is 14 and oxygen saturation is 96%.         Physical Exam           Results  RADIOLOGY  Ovarian ultrasound: Ovaries not visualized due to bowel gas (2023)             Total time I spent in care of this patient today (excluding time spent  on other billable services) was 30 minutes.     I obtained verbal consent from the patient to use AI ambient technology to transcribe the interactions between the patient and myself during the clinical encounter.

## 2023-09-18 ENCOUNTER — Other Ambulatory Visit: Payer: Self-pay | Admitting: Student in an Organized Health Care Education/Training Program

## 2023-09-18 DIAGNOSIS — I10 Essential (primary) hypertension: Secondary | ICD-10-CM

## 2023-09-18 NOTE — Telephone Encounter (Signed)
 Pharmacy Refill Optimization (PRO)    Refill authorized per PRO CPA 690-00 09/18/2023    Meets PRO CPA 690-00: YES    See Protocol Details for additional information   ====================================================================    Medication name:   Requested Prescriptions     Pending Prescriptions Disp Refills    Losartan  (COZAAR ) 100 mg tablet [Pharmacy Med Name: LOSARTAN  100MG  TABLETS] 90 tablet 3     Sig: Take 1 tablet by mouth every day.     Labs (if required by protocol):   Lab Results   Component Value Date    NA 140 06/25/2023    NA 141 03/11/2023    K 4.8 06/25/2023    K 4.3 03/11/2023    CA 9.6 06/25/2023    CA 9.3 03/11/2023    CO2 25 06/25/2023    CO2 24 03/11/2023    CR 1.22 (H) 06/25/2023    CR 1.22 (H) 03/11/2023    GFR 46 06/25/2023    GFR 46 03/11/2023    BUN 24 (H) 06/25/2023    BUN 22 (H) 03/11/2023

## 2023-09-30 ENCOUNTER — Ambulatory Visit: Admitting: Student in an Organized Health Care Education/Training Program

## 2023-09-30 VITALS — BP 119/56 | HR 67 | Temp 98.4°F | Ht 62.0 in | Wt 158.5 lb

## 2023-09-30 DIAGNOSIS — G8929 Other chronic pain: Secondary | ICD-10-CM

## 2023-09-30 DIAGNOSIS — F418 Other specified anxiety disorders: Secondary | ICD-10-CM

## 2023-09-30 DIAGNOSIS — M5417 Radiculopathy, lumbosacral region: Secondary | ICD-10-CM

## 2023-09-30 DIAGNOSIS — G5732 Lesion of lateral popliteal nerve, left lower limb: Secondary | ICD-10-CM

## 2023-09-30 DIAGNOSIS — M5442 Lumbago with sciatica, left side: Secondary | ICD-10-CM

## 2023-09-30 DIAGNOSIS — H938X3 Other specified disorders of ear, bilateral: Secondary | ICD-10-CM

## 2023-09-30 MED ORDER — DULOXETINE 40 MG CAPSULE,DELAYED RELEASE
40.0000 mg | DELAYED_RELEASE_CAPSULE | Freq: Every day | ORAL | 3 refills | Status: DC
Start: 2023-09-30 — End: 2024-01-11

## 2023-09-30 NOTE — Nursing Note (Signed)
 Paula Daugherty has been identified by name, and date of birth.  [name] is here with Spouse DJ    Patient's chief complaint noted.     I have verified and updated the following:  Allergy   Tobacco   Preferred pharmacy  PHQ-2/PHQ9       Health Maintenance(Care Gaps) reviewed with patient     Patient is not due for Advanced Care Planning  - Place ACP documents on counter with green rooming sheet if due.  Provider will have the conversation    Patient is not due for A1C  - Complete POC A1C in clinic if due.  If patient is going to lab TODAY, okay to decline POC. Document reason if patient declines.      Patient is not due for Chlamydia Screen - Pend order from HM tab and collect 1st catch urine if due. If patient is here for a urinary symptom, please do not collect GC/Chlamydia sample as a clean catch sample is more appropriate for care.  Document if patient declined UA sample with the reason.      Patient is not due for Retinopathy Screen - Schedule or complete exam today, document if declined.  If completed, please request medical record and complete HM with date of completion per patient.    Vital signs documented below.    Salomon Cree, MA    Temp: 36.9 C (98.4 F) (05/28 1249)  Temp src: Skin (05/28 1249)  Pulse: 67 (05/28 1249)  BP: 119/56 (05/28 1249)  Resp: --  SpO2: 97 % (05/28 1249)  Height: 157.5 cm (5\' 2" ) (05/28 1249)  Weight: 71.9 kg (158 lb 8.2 oz) (05/28 1249)

## 2023-09-30 NOTE — Progress Notes (Signed)
 [name] [place]  Family Medicine     Chief Complaint   Patient presents with    Follow-up     pain    Medication Follow Up    Ear Problem     pounding in ears         ASSESSMENT and PLAN:    1. Left peroneal mononeuropathy    2. Lumbosacral radiculitis    3. Chronic left-sided low back pain with left-sided sciatica    4. Pounding noise in ear, bilateral         Assessment & Plan  Left peroneal mononeuropathy   Lumbosacral radiculitis   Chronic left sided low back pain with left sided sciatica   Chronic, ongoing. Well-managed with no current pain. Previous severe sciatica improved without recent injections.  - Continue current management.  - Contact pain management if it pain returns.    Depression with anxiety   Chronic, exacerbated. Managed with Duloxetine  30 mg daily. Mood disturbances and hot flashes present. GYN recommended increasing dosage. Discussed increasing Cymbalta  to 40 mg for mood and hot flashes. Lexapro  considered if ineffective.  - Increase Duloxetine  to 40 mg daily.  - Monitor mood and hot flashes after dosage increase.  - Consider Lexapro  if no improvement.    Pounding noise in ear, bilateral   Established, ongoing.  Ear wax may contribute. Audiogram and ENT appointment scheduled. Earwax remover not tolerated.  - L ear with some wax present, no impaction   - Note earwax presence for audiogram and ENT appointment.  - Communicate with audiologist and ENT regarding earwax presence.    Modified Medications    Modified Medication Previous Medication    DULOXETINE  40 MG DR CAPSULE Duloxetine  (CYMBALTA ) 30 mg Delayed Release Capsule       Take 1 capsule by mouth every day.    Take 1 capsule by mouth every day.        SUBJECTIVE:  Paula Daugherty is a 78yr old female  here for chief complaint noted above.    History of Present Illness  Paula Daugherty is a 78 year old female who presents with new ear symptoms and follow-up on Cymbalta .    She experiences a new auditory symptom described as  a pounding sound similar to 'a car screech with rain' in both ears, more pronounced on the left. There is a decrease in hearing, requiring a significant increase in TV volume. Her hearing aids are less effective, and she plans to update them following an audiogram scheduled for next week. An ENT consultation is also planned.    She is taking Cymbalta  30 mg for depression and is considering an increase to 40 mg due to persistent mood issues and hot flashes, as previously suggested by her gynecologist. She also takes clonidine  0.3 mg, which has stabilized her blood pressure, though she continues to experience hot flashes and variable sleep quality.    Her family is under significant stress due to her daughter's severe medical condition, contributing to her emotional distress and depression.    ROS:  As noted in HPI. 10 point review of systems was otherwise negative.     Current Outpatient Medications   Medication Sig    Albuterol  (PROAIR  HFA, PROVENTIL  HFA, VENTOLIN  HFA) 90 mcg/actuation inhaler INHALE 1 TO 2 PUFFS BY MOUTH EVERY 6 HOURS AS NEEDED FOR WHEEZING    Amlodipine  (NORVASC) 10 mg Tablet Take 1 tablet by mouth every day.    Atorvastatin  (LIPITOR) 20 mg Tablet Take 1  tablet by mouth every day at bedtime.    Blood Sugar Diagnostic (ONETOUCH ULTRA TEST) Strips Use to test blood glucose 2x/day    CloNIDine  (CATAPRES ) 0.3 mg Tablet Take 1 tablet by mouth every day at bedtime. Indications: "change of life" signs    Clopidogrel  (PLAVIX ) 75 mg Tablet Take 1 tablet by mouth every morning.    Diclofenac  (VOLTAREN ARTHRITIS PAIN) 1 % Gel Apply to the affected area 4 times daily.    Duloxetine  40 mg DR Capsule Take 1 capsule by mouth every day.    Losartan  (COZAAR ) 100 mg tablet Take 1 tablet by mouth every day.    Metformin  (GLUCOPHAGE ) 1,000 mg tablet Take 1 tablet by mouth once daily with a meal.    Naloxone  (NARCAN ) 4 mg/actuation Nasal Spray Use only when an opioid overdose emergency is suspected.  Spray into one  nostril upon signs of opioid overdose. Call 911.   Additional doses may be given every 2 to 3 minutes until emergency medical assistance arrives.  Alternate nostrils with each dose.  Use each nasal spray only one time. (Patient not taking: Reported on 09/30/2023)    Ondansetron  (ZOFRAN -ODT) 8 mg disintegrating tablet Take 1 tablet by mouth every 8 hours if needed.     No current facility-administered medications for this visit.        OBJECTIVE:   BP 119/56 (SITE: left arm, Orthostatic Position: sitting, Cuff Size: large)   Pulse 67   Temp 36.9 C (98.4 F) (Skin)   Ht 1.575 m (5\' 2" )   Wt 71.9 kg (158 lb 8.2 oz)   LMP 05/06/1983   SpO2 97%   BMI 28.99 kg/m      Physical Exam  HEENT: Right ear canal clear, tympanic membrane normal. Left ear with cerumen present - 1/2 TM is covered.    Gen: appears well, NAD  HEENT: EOMI, sclera clear, anicteric   CV: normal rate, pulses palpable   Chest: trachea midline, non labored breathing, speaking in full sentences   Extrem: no cyanosis or edema   Neuro: no focal deficits   Skin: no rashes or lesions   Psych: normal mood and affect         Results            09/30/2023    12:51 PM   PHQ-2   Interest 1   Feeling 1   PHQ-2 Total Score 2     No question data found.     Depression Treatment Plan:  see assessment/plan section of note      No follow-ups on file.    I did review patient's past medical and family/social history, no changes noted.  Barriers to Learning assessed: none. Patient verbalizes understanding of teaching and instructions.  Education Needs Identified?   no    Patient was counseled specifically on the following: diagnostic results, impressions, and/or recommended diagnostic studies reviewed, instructions for management and/or follow-up, risk factor reduction and patient and family education    I spent 20 minutes with the patient, >50% of which were spent counseling her regarding differential, additional testing, if indicated, potential treatment options, and  lifestyle modifications, if applicable    Today's visit has complexity inherent to evaluation and management associated with medical care services that serve as the continuing focal point for all needed health care services.    I obtained verbal consent from the patient to use AI ambient technology to transcribe the interactions between the patient and myself during the clinical encounter.  Electronically signed by:  Sallyann Crea, MD  Associate Physician  [facility]

## 2023-10-07 ENCOUNTER — Ambulatory Visit: Payer: Medicare Other | Attending: Otolaryngology | Admitting: Audiologist

## 2023-10-07 DIAGNOSIS — H906 Mixed conductive and sensorineural hearing loss, bilateral: Secondary | ICD-10-CM | POA: Insufficient documentation

## 2023-10-07 NOTE — Procedures (Signed)
 Audiologic Evaluation  Paula Daugherty, a 78yr old female, was seen today for a periodic audiologic evaluation. The patient has a history of bilateral otosclerosis, stapedectomies, and chronic left tympanic membrane perforation. The patient reported hearing "roaring" sounds as well as "wet tires screeching" lately in addition to having more difficulty hearing.  She further reported occasional otalgia and feeling like she is hearing in a "tunnel."  She denied aural pressure, recent ear infections, and dizziness.     Audiogram:  Pure tone air and bone conduction testing revealed mild to moderate mixed hearing loss from [phone] sharply falling to severe to profound from 4000-8000 Hz in the right ear. Testing of the left ear revealed moderately severe to severe mixed hearing loss. There were air-bone gaps from [phone] and at 4000 Hz in the left ear and at 4000 Hz in the right ear.     Word recognition scores were excellent (92% at 75 dB HL) in the right ear and (92% at 95 dB HL) in the left ear.     Since the previous hearing evaluation on 02/19/2022, hearing sensitivity has remained stable in the right ear and diminished by 10-20 dB HL from [phone].    Tympanometry:  Right: Type A tympanogram, consistent with normal tympanic membrane mobility and middle ear pressure  Left: Type B tympanogram with high ear canal volume, which suggests a tympanic membrane perforation.    Recommendations/Counseling:  1) Counseled regarding today's results- request placed for [place] hearing aid evaluation.  2) Follow-up with ENT as scheduled.   3) Repeat audiogram per ENT or sooner if concerns arise.    Report Electronically Signed By  Thelda Finney, Au.D., CCC-A  Senior Audiologist  ENT-Otolaryngology  [phone]

## 2023-10-08 ENCOUNTER — Ambulatory Visit: Payer: Medicare Other | Attending: Otolaryngology | Admitting: Otolaryngology

## 2023-10-08 ENCOUNTER — Encounter: Payer: Self-pay | Admitting: Otolaryngology

## 2023-10-08 VITALS — BP 126/79 | HR 71 | Temp 96.4°F | Ht 62.0 in | Wt 158.0 lb

## 2023-10-08 DIAGNOSIS — H906 Mixed conductive and sensorineural hearing loss, bilateral: Secondary | ICD-10-CM | POA: Insufficient documentation

## 2023-10-08 DIAGNOSIS — H7292 Unspecified perforation of tympanic membrane, left ear: Secondary | ICD-10-CM | POA: Insufficient documentation

## 2023-10-08 DIAGNOSIS — H9072 Mixed conductive and sensorineural hearing loss, unilateral, left ear, with unrestricted hearing on the contralateral side: Secondary | ICD-10-CM | POA: Insufficient documentation

## 2023-10-08 DIAGNOSIS — H8093 Unspecified otosclerosis, bilateral: Secondary | ICD-10-CM | POA: Insufficient documentation

## 2023-10-08 DIAGNOSIS — H903 Sensorineural hearing loss, bilateral: Secondary | ICD-10-CM | POA: Insufficient documentation

## 2023-10-08 DIAGNOSIS — Z9889 Other specified postprocedural states: Secondary | ICD-10-CM | POA: Insufficient documentation

## 2023-10-08 NOTE — Nursing Note (Signed)
 Arrived with spouse. Vital signs taken, allergies verified, screened for pain, medication hx taken.   Age appropriate, interactive and cooperative. No acute distress noted at this time.   Contact precautions were followed when caring for the patient.       Tarri Fuller, MA.   Medical Assistant II

## 2023-10-08 NOTE — Progress Notes (Signed)
 Patient: Paula Daugherty  Location: Otolaryngology Clinic  Medical Record Number: [id number] Sex: female Age: 68yr  Date of Service: 10/08/2023 Date of Birth: 08/15/1945    CHIEF COMPLAINT:  BILATERAL otosclerosis, LEFT TM perforation     HISTORY OF PRESENT ILLNESS:  Paula Daugherty is a 78yr old female with BILATERAL otosclerosis, LEFT TM perforation, s/p:  - LEFT stapedectomy [House wire prosthesis] Mariette Shorts [place]) - 1970s  - RIGHT stapedectomy [House wire prosthesis] Mariette Shorts [place]) - 1980s  - LEFT paper patch Mariette Shorts) - 2009     INITIAL CONSULTATION 09/12/13:  Pt presenting with a c/o R otalgia. Patient reports 2-3 week intermittent, fleeting, sharp shooting R-sided otalgia with pain sometimes radiating to jaw line. Has experienced same pain on L side x2-3 days. Denies otorrhea/fever/HA. Denies significant change in hearing since onset of pain. Denies pain with opening/closing jaw. Patient endorses h/o GERD.  History of bilateral otosclerosis and bilateral stapedectomies with House wire prosthesis (left in 1970s, right in [facility]s at [facility]), known left chronic ear perforation (hx of unsuccessful Paper patch placed in Lt TM 2009).      2019:  She was previously evaluated by Dr. Irving Mantle in 2015 for right otalgia. Otalgia is improved with improved TMJ symptoms. She presents today with worsening word discrimination in the left ear. Otherwise, she has no symptoms. She denies drainage from either ear or vertigo. She does have intermittent high pitched tinnitus in both ears.      2022:  She feels like left hearing and speech discrimination continues to decline. She has trialed hearing aids in the past, most recently 5-6 years ago. No d/d/i/v            2023:  - got HAs from [facility] and has been very happy with them  - no otorrhea in either ear or left ear, no pain  - HAD noted wax and rec ENT f/u; PMD rec cerumen removal gtts, which pt used once on left but had significant pain and dysguesia so stopped;   - no  further drainage, pain, bleeding; taste returned to normal     2024:  - had stroke last week, is on Plavix /ASA  - ow doing well, HAs working well bilaterally  - hearing stable bilaterally, no changes in hearing  - no otorrhea, no otalgia    2025:  - hearing subjectively worse AS  - tinnitus worse AS, sounds in a tunnel, roaring  - HAs still effective AU, but needs to get them adjusted  - no otorrhea, no otalgia        PHYSICAL EXAMINATION:  General: The patient appears well, is in no distress. Pleasant and cooperative.  Psychiatric: Mood and affect are stable. Oriented to person, place, time and condition.   Dermatologic: Skin warm and dry.  Neurologic: Gait and station normal. Facial nerve intact and symmetric bilaterally. Gaze is conjugate.  Head: Normocephalic, grossly normal appearance. No obvious external lesions.   Respiratory: Breathing comfortably, no shortness of breath or tachypnea. No excessive use of accessory muscles of respiration. No audible stridor or wheezing.  Facial Nerve: intact and symmetrical all branches bilaterally      Right ear:  Auricle normal. EAC clear, TM clear.   Left ear:  Auricle normal. EAC clear, TM clear.   Binocular otomicroscopy was indicated to better visualize and evaluate the ears bilaterally given the patient's history and physical exam findings.  See binocular otomicroscopy examination findings below.     PROCEDURE:  Bilateral ear examination under  binocular otomicroscopy:  Right ear:  EAC clear. Tympanic membrane clear. Wire loop visible under posterior/superior quadrant. TM mobile to +/- pneumatic otoscopy.   Left ear:  EAC clear. Tympanic membrane clear.  Posterior marginal perforation 5-10% with clean edges and no skin ingrowth. Stable since 2015 exam. Wire loop visible/abutting TM posterior/superior quadrant.           AUDIOGRAM:  I personally reviewed audiometry from 09/12/13 and find the following results:          AUDIOGRAM:  I personally reviewed audiometry from  02/25/18 and find the following results:          AUDIOGRAM:  I personally reviewed audiometry from 08/02/20 and find the following results:          AUDIOGRAM:  I personally reviewed audiometry from 01/15/21 and find the following results:       AUDIOGRAM:  I personally reviewed audiometry from 02/19/22 and find the following results:       AUDIOGRAM:  I personally reviewed audiometry from 04/10/22 and find the following results:      AUDIOGRAM:  I personally reviewed audiometry from 10/07/23 and find the following results:             Impression  IMPRESSION:  BILATERAL otosclerosis  LEFT posterior marginal TM perforation   - stable since 2019 or earlier (2009?)  - 5-10%  - without secondary cholesteatoma   BILATERAL HF SNHL  LEFT MHL - widening ABG  s/p:  - LEFT stapedectomy [House wire prosthesis] [facility]) - 1970s  - RIGHT stapedectomy [House wire prosthesis] Mariette Shorts [place]) - 1980s  - LEFT paper patch Mariette Shorts) - 2009            Recommendations  PLAN:  1. Probable diagnoses discussed with the patient.   2. Possible treatment options discussed with the patient.   3. Avoid all gtts and cerumen removal products, given chronic left perf   4. Continue HA use and monitor for any otorrhea or infection issues; pt will readjust at [facility]  5. 2025 audio reviewed today, ABG AS since 2019, worse since 2023  6. Patient is medically cleared to pursue hearing aid purchase and use bilaterally.  Letter of medical clearance and copy of audio given to patient today.  Patient may pursue HAs locally.  Local HAD/HA resources were also provided to the patient as a suggested list. She will return to [facility]  7. Discussed continued observation vs consider left tympanoplasty.  Pt will readjust HAs and if happy with results, will CTO.  If continues to struggle despite optimizing or replacing HAs, she will contact me to schedule LEFT tympanoplasty, possible LEFT revision stapedectomy  8. Discussed risks, benefits, alternatives,  expected outcomes of surgery, as well as potential complications of untreated disease.  Risks of otologic/neurotologic/skull base surgery include but are not limited to:  facial nerve injury, hearing loss, vertigo, dysequilibrium, dysguesia/change of taste, hypesthesia/numbness of ear, cosmetic deformity of incision or ear, bleeding, superficial or deep infection, CSF leak, meningitis, brain abscess, meningoencephalocele, recurrence or recidivism of disease, stroke, heart attack, death.  Short and long term treatment plans discussed in detail.  Possible need for staged treatment strategy discussed in detail.  Patient had a chance to ask and have answered all questions, appears to be very well-informed, and gives full informed consent to proceed with surgery as discussed - if HA adjustment is inadequate to provide acceptable hearing benefit to her.    8.  Pt and husband also  inquired about replacing prosthesis(es) to allow for MRI in future prn;   - if LEFT prosthesis needs to be replaced at time of surgery, risk of complete SNHL increases but may be necessary  - pre-emptive stapes prosthesis replacement runs higher risk of complete hearing loss in operated ear than simple adjustment/tightening of wire loop around incus  - given this, pt wishes to defer pre-emptive replacement of prostheses and will consider only LEFT revision stapedectomy if indicated based on intraop findings at time of tympanoplasty (PRN)  8. All questions answered.  Patient and husband are appreciative of care in consultation today.      Total time I spent in care of this patient today (excluding time spent on other billable services) was 50 minutes.       Today's visit has complexity inherent to evaluation and management associated with medical care services that are part of ongoing care related to a patient's single, serious condition or a complex condition, which is OTOSCLEROSIS, MRI COMPATIBILITY OF IMPLANTS, TM PERFORATION, PROGRESSIVE MIXED  HEARING LOSS.        ------------------------------      10/08/2023, 9:34 AM  Electronically Signed By:   Mariea Shook, MD FACS  Professor  Otology, Neurotology, and [facility] Surgery  Department of Otolaryngology - Head and Neck Surgery  [place]  [facility]

## 2023-10-15 ENCOUNTER — Ambulatory Visit: Attending: Family Medicine

## 2023-10-15 DIAGNOSIS — M5442 Lumbago with sciatica, left side: Secondary | ICD-10-CM | POA: Insufficient documentation

## 2023-10-15 DIAGNOSIS — K760 Fatty (change of) liver, not elsewhere classified: Secondary | ICD-10-CM | POA: Insufficient documentation

## 2023-10-15 DIAGNOSIS — K7460 Unspecified cirrhosis of liver: Secondary | ICD-10-CM | POA: Insufficient documentation

## 2023-10-15 DIAGNOSIS — Z794 Long term (current) use of insulin: Secondary | ICD-10-CM | POA: Insufficient documentation

## 2023-10-15 DIAGNOSIS — K7581 Nonalcoholic steatohepatitis (NASH): Secondary | ICD-10-CM | POA: Insufficient documentation

## 2023-10-15 DIAGNOSIS — D699 Hemorrhagic condition, unspecified: Secondary | ICD-10-CM | POA: Insufficient documentation

## 2023-10-15 DIAGNOSIS — E1129 Type 2 diabetes mellitus with other diabetic kidney complication: Secondary | ICD-10-CM | POA: Insufficient documentation

## 2023-10-15 DIAGNOSIS — G8929 Other chronic pain: Secondary | ICD-10-CM | POA: Insufficient documentation

## 2023-10-15 LAB — COMPREHENSIVE METABOLIC PANEL
Alanine Transferase (ALT): 28 U/L (ref <=33)
Albumin: 4.1 g/dL (ref 4.0–4.9)
Alkaline Phosphatase (ALP): 117 U/L (ref 35–129)
Anion Gap: 13 mmol/L (ref 7–15)
Aspartate Transaminase (AST): 29 U/L (ref <=41)
Bilirubin Total: 0.9 mg/dL (ref <=1.2)
Calcium: 9.5 mg/dL (ref 8.6–10.0)
Carbon Dioxide Total: 23 mmol/L (ref 22–29)
Chloride: 100 mmol/L (ref 98–107)
Creatinine Serum: 1.07 mg/dL (ref 0.51–1.17)
E-GFR Creatinine (Female): 54 mL/min/{1.73_m2}
Glucose: 149 mg/dL — ABNORMAL HIGH (ref 74–109)
Potassium: 4.2 mmol/L (ref 3.4–5.1)
Protein: 7.8 g/dL (ref 6.6–8.7)
Sodium: 136 mmol/L (ref 136–145)
Urea Nitrogen, Blood (BUN): 23 mg/dL — ABNORMAL HIGH (ref 6–20)

## 2023-10-15 LAB — CBC WITH DIFFERENTIAL
Basophils % Auto: 0.6 %
Basophils Abs Auto: 0 10*3/uL (ref 0.0–0.2)
Eosinophils % Auto: 2 %
Eosinophils Abs Auto: 0.1 10*3/uL (ref 0.0–0.5)
Hematocrit: 36 % (ref 36.0–46.0)
Hemoglobin: 12.2 g/dL (ref 12.0–16.0)
Lymphocytes % Auto: 26.4 %
Lymphocytes Abs Auto: 1.5 10*3/uL (ref 1.0–4.8)
MCH: 28.4 pg (ref 27.0–33.0)
MCHC: 34 % (ref 32.0–36.0)
MCV: 83.5 fL (ref 80.0–100.0)
MPV: 8.4 fL (ref 6.8–10.0)
Monocytes % Auto: 7.3 %
Monocytes Abs Auto: 0.4 10*3/uL (ref 0.1–0.8)
Neutrophils % Auto: 63.7 %
Neutrophils Abs Auto: 3.6 10*3/uL (ref 1.8–7.7)
Platelet Count: 164 10*3/uL (ref 130–400)
RDW: 14.7 % (ref 0.0–14.7)
Red Blood Cell Count: 4.31 10*6/uL (ref 4.00–5.20)
White Blood Cell Count: 5.7 10*3/uL (ref 4.5–11.0)

## 2023-10-15 LAB — HEPATITIS A AB, IGG: Hepatitis A Ab, IgG: NONREACTIVE

## 2023-10-15 LAB — INR
INR: 1 (ref 0.9–1.1)
Prothrombin Time: 11.9 s (ref 10.0–12.8)

## 2023-10-15 LAB — HEMOGLOBIN A1C
Hgb A1C,Glucose Est Avg: 148 mg/dL
Hgb A1C: 6.8 % — ABNORMAL HIGH (ref 3.9–5.6)

## 2023-10-15 LAB — AFP CANCER MARKER: AFP Cancer Marker: 4.4 ng/mL (ref <=8.8)

## 2023-10-15 LAB — FERRITIN: Ferritin: 28 ng/mL (ref 13–150)

## 2023-10-15 LAB — APTT STUDIES: aPTT: 29.3 s (ref 26.0–37.2)

## 2023-10-16 LAB — ANTI-MITOCHONDRIAL AB: ANTI-MITOCHONDRIAL Ab SCREEN: NEGATIVE

## 2023-10-16 LAB — ALPHA-1-ANTITRYPSIN: Alpha-1-Antitrypsin: 136 mg/dL (ref 90–200)

## 2023-10-16 LAB — CERULOPLASMIN: Ceruloplasmin: 27.2 mg/dL (ref 20.0–60.0)

## 2023-10-19 ENCOUNTER — Ambulatory Visit: Payer: Self-pay | Admitting: GASTROENTEROLOGY

## 2023-10-20 ENCOUNTER — Ambulatory Visit: Payer: Medicare Other | Attending: "Endocrinology | Admitting: "Endocrinology

## 2023-10-20 ENCOUNTER — Encounter: Payer: Self-pay | Admitting: "Endocrinology

## 2023-10-20 DIAGNOSIS — E119 Type 2 diabetes mellitus without complications: Secondary | ICD-10-CM | POA: Insufficient documentation

## 2023-10-20 DIAGNOSIS — Z7984 Long term (current) use of oral hypoglycemic drugs: Secondary | ICD-10-CM | POA: Insufficient documentation

## 2023-10-20 DIAGNOSIS — E1129 Type 2 diabetes mellitus with other diabetic kidney complication: Secondary | ICD-10-CM | POA: Insufficient documentation

## 2023-10-20 DIAGNOSIS — Z794 Long term (current) use of insulin: Secondary | ICD-10-CM | POA: Insufficient documentation

## 2023-10-20 MED ORDER — ONETOUCH ULTRA TEST STRIPS: ORAL_STRIP | 11 refills | Status: AC

## 2023-10-20 MED ORDER — ATORVASTATIN 20 MG TABLET: 20.0000 mg | ORAL_TABLET | Freq: Every day | ORAL | 3 refills | Status: AC

## 2023-10-20 MED ORDER — METFORMIN ER 500 MG TABLET,EXTENDED RELEASE 24 HR: 1500.0000 mg | EXTENDED_RELEASE_TABLET | Freq: Every day | ORAL | 3 refills | Status: DC

## 2023-10-20 NOTE — Progress Notes (Signed)
 Endocrinology Follow up clinic note:    Chief Complaint:  I'm here to follow up on my diabetes.    HPI: Paula Daugherty is a 78yr old female who has a diagnosis of type 2 diabetes mellitus who presents to Endocrinology for follow up. Her history is as follows:  She was initially diagnosed with diabetes around 2000 on routine labs. She has a strong family history of diabetes so she wasn't surprised at this. She was started on Metformin  around 2005 and then she was started on insulin  in 2014. She was up to 64 units of Lantus  at night. However, after making changes to her diet and lifestyle, she was taken off insulin  in 2015. Basal insulin  was added back to her regimen in 2018 and she was later able to discontinue this.      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     Interim History: She returns today for follow up. Her last visit with Endocrinology was on 06/23/2023. Since this visit, she states that for the most part, she has been doing well. She has had improvement in her sciatica pain with an increase in her cymbalta  dose. However, she has noted some weight gain and increased bread intake on this medication.  She has noted that her glucose has been running higher in the morning which she attributes to diet as well as increased life stressors. No recent hypoglycemia or symptoms of hypoglycemia.     Current Regimen:    Metformin  1000 mg QHS  Hypoglycemia: None recently  Hyperglycemia: Yes, glucose is running higher in the mornings  Meter brought today: Yes  Checks blood glucose: 2x/day   Time in Range: 72%   High: 28%   Very high: 0%   Low: 0%   Very low: 0%  Home blood glucose numbers per pt's log:   Blood Glucose:     Pre-breakfast: 147 to 194.       Pre-dinner: 110 to 141.       Last eye exam: Has f/up scheduled on 04/11/2024     ROS:  All other systems were reviewed and are negatative except for pertinent positive and negative responses as documented in HPI.     Medications:   Medication reconciliation was performed today.   Medications Ordered Prior to Encounter[1]    I did review patient's past medical and family/social history, no changes noted.   PMH:  Past medical history was reviewed from problem list.   Problem List[2]    VITAL SIGNS:  BP 129/63 (SITE: left arm, Orthostatic Position: sitting, Cuff Size: large)   Pulse 64   Temp 36.6 C (97.8 F) (Temporal)   Resp 18   Ht 1.575 m (5' 2)   Wt 72.5 kg (159 lb 13.3 oz)   LMP 05/06/1983   SpO2 98%   BMI 29.23 kg/m   Body mass index is 29.23 kg/m.    PHYSICAL EXAM:  General Appearance: healthy, alert, no distress, pleasant affect, cooperative.  Eyes:  conjunctivae and corneas clear.  EOM's intact. sclerae normal.  Neck:  Neck supple. No adenopathy, thyroid  symmetric, normal size.  Heart:  normal rate and regular rhythm, no murmurs, clicks, or gallops.  Lungs: clear to auscultation.  Extremities:  no cyanosis, clubbing, or edema.  Skin:  Skin color, texture, turgor normal. No rashes or lesions.  Neuro: No tremor appreciated.   Mental Status: Appearance/Cooperation: in no apparent distress and well developed and well nourished  Eye  Contact: normal  Speech: normal volume, rate, and pitch    LAB TESTS/STUDIES:   I personally reviewed the following laboratory and/or imaging studies.   12/25/22 10:09 03/11/23 10:26 06/17/23 10:33 06/25/23 11:32 10/15/23 09:59   Sodium 141 141  140 136   Potassium 4.5 4.3  4.8 4.2   Chloride 105 105  101 100   Carbon Dioxide Total 23 24  25 23    Anion Gap 13 12  14 13    Urea Nitrogen, Blood (BUN) 27 (H) 22 (H)  24 (H) 23 (H)   Creatinine Blood 1.35 (H) 1.22 (H)  1.22 (H) 1.07   Glucose 108 132 (H)  119 (H) 149 (H)   Calcium  9.3 9.3  9.6 9.5   Protein    7.9 7.8   Albumin    4.2 4.1   Alkaline Phosphatase (ALP)    136 (H) 117   Aspartate Transaminase (AST)    43 (H) 29   Bilirubin Total    0.8 0.9   Alanine Transferase (ALT)    27 28   E-GFR Creatinine (Female) 41 46  46 54   Cholesterol 108        Triglyceride 147       LDL Cholesterol Calculation 26       HDL Cholesterol 53       Non-HDL Cholesterol 55       Total Cholesterol:HDL Ratio 2.0       Iron  Total    67    Transferrin    285    Total Iron  Binding Capacity    396    Iron  Percent Saturation    16.9    Ferritin 33 24 21  28    Folate >20.0       AFP Cancer Marker     4.4   ALPHA-1-ANTITRYPSIN     136   Ceruloplasmin     27.2   Hgb A1C  6.8 (H) 7.3 (H)  6.8 (H)   Hgb A1C,Glucose Est Avg  148 163  148   Vitamin D , 25 Hydroxy 50.1 (H)          10/15/23 09:59   White Blood Cell Count 5.7   Red Blood Cell Count 4.31   Hemoglobin 12.2   Hematocrit 36.0   MCV 83.5   MCH 28.4   MCHC 34.0   RDW 14.7   Platelet Count 164      06/17/23 10:33   Creatinine Spot Urine 155.2  155.2   Microalbumin Urine 34.1   Microalbumin/Creatinine Ratio 220     Thyroid  ultrasound (09/25/2021):  FINDINGS:  Parenchymal echotexture: heterogeneous.  Right lobe: 3.9 x 1.4 x 1.9 cm (5.1 cc).  Nodules:  Subcentimeter nodule(s) present but none that meet criteria for FNA or  follow up.  Left lobe: 4.1 x 1.7 x 2.1 cm (6.9 cc).  Nodules:  1. Mid lobe, 0.9 cm, solid or almost completely solid (2 pts),  hypoechoic (2 pts), wider-than-tall (0 pt), with smooth margins (0 pt).  Macrocalcifications: absent (0 pt). Peripheral rim calcification: absent (0  pt). Punctate echogenic foci: none (0 pt). TR4 - Moderately suspicious.  Subcentimeter nodule(s) present but none that meet criteria for FNA or  follow up.  Isthmus: 0.3 cm.  Nodules:  none.  No abnormal cervical lymph nodes.      IMPRESSION:  1. No nodules meet criteria for FNA or follow-up.    Impression: This is a 78yr old female with Diabetes Mellitus Type II who  presents to Endocrinology for follow up. Overall, her glycemic control is at goal as evidenced by her most recent A1c of 6.8%. Review of her glucose trends on her meter download demonstrates that the glucose is running higher in the mornings with normal values later in the day. I have  recommended changing metformin  to long-acting and increasing the dose at nighttime.      She also has a history of an adrenal nodule. Hormonal evaluation was last done and was normal in 04/2021.      DIABETES HISTORY  (-) h/o retinopathy.      (-) h/o microalbuminuria/nephropathy.  (-) h/o neuropathy.  (-) h/o Autonomic dysfunction  (-) h/o Gastroparesis  (-) h/o CAD  (-) h/o PVD     Last eye exam: f/up scheduled 04/2024  Last urine microalbumin: 06/2023, 220  Pneumovax: 12/2014, Prevnar 2018  Influenza vaccine: fall 2024  (-) ASA  (+) Statin  (+) ACE/ARB          Recommendations:  (E11.29) Type 2 diabetes mellitus with other diabetic kidney complication  (primary encounter diagnosis)  - Change metformin  to 500 mg ER tablets - take 1500 mg (3 tablets) every night  - Encouraged patient to continue close blood glucose monitoring, will notify me on glucose trends at home  - Last LDL at goal, continue statin, repeat due 12/2023  - Last microalbumin:creatinine ratio elevated, continue cozaar , repeat due 06/2024  - Up to date on screening eye exam, f/up as scheduled in 04/2024  - Blood pressure at goal, continue ARB  - Repeat A1c, thyroid  function tests prior to follow up appointment  - Discussed daily foot care    Approximately 20 minutes were spent with patient, greater than 50% of which was spent counseling the patient on diabetes management and on coordination of care.    Follow up in 3 months    If you have any questions, please do not hesitate to contact me at [phone].  Thank you for allowing me to participate in the care of this patient.    EDUCATION:  I educated/instructed the patient or caregiver regarding all aspects of the above stated plan of care.  The patient or caregiver indicated understanding.      [place] interpreter was not used.    Report electronically signed by:  Reche Canales, M.D.  Clinical Associate Professor  Department of Endocrinology              [1]   Current Outpatient Medications on File Prior  to Visit   Medication Sig Dispense Refill    Albuterol  (PROAIR  HFA, PROVENTIL  HFA, VENTOLIN  HFA) 90 mcg/actuation inhaler INHALE 1 TO 2 PUFFS BY MOUTH EVERY 6 HOURS AS NEEDED FOR WHEEZING 8.5 g 0    Amlodipine  (NORVASC) 10 mg Tablet Take 1 tablet by mouth every day. 90 tablet 3    CloNIDine  (CATAPRES ) 0.3 mg Tablet Take 1 tablet by mouth every day at bedtime. Indications: change of life signs 30 tablet 11    Clopidogrel  (PLAVIX ) 75 mg Tablet Take 1 tablet by mouth every morning. 90 tablet 3    Diclofenac  (VOLTAREN ARTHRITIS PAIN) 1 % Gel Apply to the affected area 4 times daily. 100 g 1    Duloxetine  40 mg DR Capsule Take 1 capsule by mouth every day. 90 capsule 3    Losartan  (COZAAR ) 100 mg tablet Take 1 tablet by mouth every day. 90 tablet 3    Ondansetron  (ZOFRAN -ODT) 8 mg disintegrating tablet Take 1 tablet by mouth  every 8 hours if needed. 90 tablet 2     No current facility-administered medications on file prior to visit.   [2]   Patient Active Problem List  Diagnosis    Diabetes (HCC)    Depression with anxiety    Stress at home    Hypertension    GERD (gastroesophageal reflux disease)    Hyperlipidemia with target LDL less than 70    Abnormal LFTs    Renal cyst ( 6.4 cm cyst left kidney)    Cough    Disorder of liver    Macroalbuminuric diabetic nephropathy (HCC)    History of actinic keratoses    Cataract    Ptosis of eyelid    Liver fibrosis    Adrenal nodule (HCC)    Medication Therapy Auth    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HCC)    Mixed conductive and sensorineural hearing loss of both ears    Neoplasm of uncertain behavior of skin    Abdominal pain, generalized    Nausea without vomiting    Asymptomatic microscopic hematuria    Subcutaneous nodule    Angina pectoris (HCC)    Mild episode of recurrent major depressive disorder (HCC)    NASH (nonalcoholic steatohepatitis)    Iron  deficiency anemia due to chronic blood loss    Kidney disorder

## 2023-10-20 NOTE — Nursing Note (Signed)
 Patient presents for visit with MD.    Patient ID verified x2 - Name and DOB.   Vital signs taken, allergies and medications reviewed, pharmacy confirmed.       Yann Biehn, LVN

## 2023-10-20 NOTE — Patient Instructions (Signed)
 Change metformin  to long-acting 500 mg tablets - take total of 1500 mg (3 tablets) every night  Return to clinic in 3 months with fasting blood tests first

## 2023-10-21 ENCOUNTER — Ambulatory Visit: Payer: Self-pay

## 2023-10-22 ENCOUNTER — Other Ambulatory Visit: Payer: Self-pay | Admitting: Student in an Organized Health Care Education/Training Program

## 2023-10-22 DIAGNOSIS — I6320 Cerebral infarction due to unspecified occlusion or stenosis of unspecified precerebral arteries: Secondary | ICD-10-CM

## 2023-10-25 NOTE — Telephone Encounter (Signed)
 Pharmacy Refill Optimization (PRO)    Refill authorized per PRO CPA 690-00 10/25/2023    Meets PRO CPA [id number]: YES    See Protocol Details for additional information   ====================================================================    Medication name:   Requested Prescriptions     Pending Prescriptions Disp Refills    Clopidogrel  (PLAVIX ) 75 mg Tablet [Pharmacy Med Name: CLOPIDOGREL  75MG  TABLETS] 90 tablet 3     Sig: Take 1 tablet by mouth every morning.     Labs (if required by protocol):   Lab Results   Component Value Date    WBC 5.7 10/15/2023    WBC 4.7 06/17/2023    HGB 12.2 10/15/2023    HGB 11.4 (L) 06/17/2023    HCT 36.0 10/15/2023    HCT 35.4 (L) 06/17/2023    PLT 164 10/15/2023    PLT 178 06/17/2023      09/30/23 OV: (Taking)    04/01/23 OV:  Neurology Follow-up  Patient recently saw neurologist who recommended continuation of Plavix .

## 2023-10-26 ENCOUNTER — Encounter: Payer: Self-pay | Admitting: Student in an Organized Health Care Education/Training Program

## 2023-10-26 DIAGNOSIS — L304 Erythema intertrigo: Secondary | ICD-10-CM

## 2023-10-26 NOTE — Telephone Encounter (Signed)
 Refill Request  Forwarding to Clinical staff to address.      [MA:  Create an Encounter and use .MCMEDREFILL]

## 2023-10-28 MED ORDER — NYSTATIN 100,000 UNIT/GRAM TOPICAL POWDER: TOPICAL | 0 refills | Status: DC

## 2023-10-28 NOTE — Telephone Encounter (Signed)
 Pharmacy Refill Optimization (PRO)    Refill authorized per PRO CPA 690-00 10/28/2023    Meets PRO CPA [id number]: YES    ====================================================================    Provider: Brien Logan Maus, MD  Next appointment: 01/11/2024   Last appointment: 09/30/2023  Medication Name:   Requested Prescriptions     Pending Prescriptions Disp Refills    NYSTATIN  100,000 unit/gram Powder 60 g      Sig: Apply a thin layer of powder to affected area 4 times a day as needed     Last prescribed date per EMR: 04/23/2022  Med last filled: 06/12/2023 30/15  Med last reviewed/reconciled: 06/25/2023

## 2023-11-04 ENCOUNTER — Encounter: Payer: Self-pay | Admitting: OBSTETRICS/GYN

## 2023-11-04 MED ORDER — CLONIDINE HCL 0.2 MG TABLET
0.2000 mg | ORAL_TABLET | Freq: Two times a day (BID) | ORAL | 11 refills | Status: DC
Start: 2023-11-04 — End: 2023-12-09

## 2023-11-12 ENCOUNTER — Other Ambulatory Visit: Payer: Self-pay | Admitting: "Endocrinology

## 2023-11-12 DIAGNOSIS — E119 Type 2 diabetes mellitus without complications: Secondary | ICD-10-CM

## 2023-11-12 MED ORDER — METFORMIN ER 500 MG TABLET,EXTENDED RELEASE 24 HR: 2000.0000 mg | EXTENDED_RELEASE_TABLET | Freq: Every day | ORAL | 3 refills | Status: DC

## 2023-11-16 ENCOUNTER — Ambulatory Visit: Payer: Medicare Other

## 2023-11-16 ENCOUNTER — Ambulatory Visit: Payer: Self-pay | Admitting: NURSE PRACTITIONER

## 2023-11-16 NOTE — Telephone Encounter (Signed)
 Paula Daugherty is a 78yr old female  3 patient identifiers used.  Per:   Patient.    Disposition: GO TO ED NOW Referred to ER per rectal protocol  Per:   Patient verbalizes agreement to plan. Agrees to callback with any increase in symptoms/concerns or questions.    See Assessment Below  Patient reports last week she began noticing black bowel movements. She has had multiple episodes over the weekend. Has a coffee ground appearance with the stool. Is on Plavix . Having mild abd pain with nausea, no vomiting. Taking Zofran . No BM today. Not taking iron  or Pepto. Agreeable to ER evaluation; [facility] [place]. Advised to return call for any new or worsening concerns.      See Care Advice Below  Care Advice            Rectal Marybeth GORMAN Pal, RN Mon Nov 16, 2023 11:31 AM      Care Advice       GO TO ED NOW:  * You need to be seen in the Emergency Department.  * Go to the ED at  [facility].  * Leave now. Drive carefully.                              See Protocol and Disposition Below      Reason for Disposition   Black or tarry bowel movements  (Exception: Chronic-unchanged black-grey BMs AND is taking iron  pills or Pepto-Bismol.)    Additional Information   Negative: Shock suspected (e.g., cold/pale/clammy skin, too weak to stand, low BP, rapid pulse)   Negative: Difficult to awaken or acting confused (e.g., disoriented, slurred speech)   Negative: Passed out (i.e., lost consciousness, collapsed and was not responding)   Negative: [1] Vomiting AND [2] contains red blood or black (coffee ground) material  (Exception: Few red streaks in vomit that only happened once.)   Negative: Sounds like a life-threatening emergency to the triager   Negative: SEVERE rectal bleeding (large blood clots; constant or on and off bleeding)   Negative: SEVERE dizziness (e.g., unable to stand, requires support to walk, feels like passing out now)   Negative: [1] MODERATE rectal bleeding (small blood clots, passing blood without stool,  or toilet water  turns red) AND [2] more than once a day   Negative: Pale skin (pallor) of new-onset or worsening    Protocols used: Rectal Bleeding-ADULT-AH      Sandria Pal BSN, RN  PCN Triage

## 2023-11-18 ENCOUNTER — Ambulatory Visit: Attending: Family Medicine | Admitting: Family Medicine

## 2023-11-18 ENCOUNTER — Ambulatory Visit (INDEPENDENT_AMBULATORY_CARE_PROVIDER_SITE_OTHER)

## 2023-11-18 VITALS — BP 112/55 | HR 64 | Temp 97.1°F | Ht 62.0 in | Wt 159.6 lb

## 2023-11-18 DIAGNOSIS — R1032 Left lower quadrant pain: Secondary | ICD-10-CM | POA: Insufficient documentation

## 2023-11-18 DIAGNOSIS — K921 Melena: Secondary | ICD-10-CM

## 2023-11-18 DIAGNOSIS — K5900 Constipation, unspecified: Secondary | ICD-10-CM | POA: Insufficient documentation

## 2023-11-18 DIAGNOSIS — Z09 Encounter for follow-up examination after completed treatment for conditions other than malignant neoplasm: Secondary | ICD-10-CM | POA: Insufficient documentation

## 2023-11-18 LAB — COMPREHENSIVE METABOLIC PANEL
Alanine Transferase (ALT): 26 U/L (ref <=33)
Albumin: 4.2 g/dL (ref 4.0–4.9)
Alkaline Phosphatase (ALP): 99 U/L (ref 35–129)
Anion Gap: 13 mmol/L (ref 7–15)
Aspartate Transaminase (AST): 32 U/L (ref <=41)
Bilirubin Total: 0.9 mg/dL (ref <=1.2)
Calcium: 9.3 mg/dL (ref 8.6–10.0)
Carbon Dioxide Total: 23 mmol/L (ref 22–29)
Chloride: 104 mmol/L (ref 98–107)
Creatinine Serum: 1.14 mg/dL (ref 0.51–1.17)
E-GFR Creatinine (Female): 50 mL/min/1.73m*2
Glucose: 147 mg/dL — ABNORMAL HIGH (ref 74–109)
Potassium: 4.9 mmol/L (ref 3.4–5.1)
Protein: 7.6 g/dL (ref 6.6–8.7)
Sodium: 140 mmol/L (ref 136–145)
Urea Nitrogen, Blood (BUN): 22 mg/dL — ABNORMAL HIGH (ref 6–20)

## 2023-11-18 LAB — CBC WITH DIFFERENTIAL
Basophils % Auto: 0.7 %
Basophils Abs Auto: 0 K/MM3 (ref 0.0–0.2)
Eosinophils % Auto: 1.6 %
Eosinophils Abs Auto: 0.1 K/MM3 (ref 0.0–0.5)
Hematocrit: 30.8 % — ABNORMAL LOW (ref 36.0–46.0)
Hemoglobin: 10.3 g/dL — ABNORMAL LOW (ref 12.0–16.0)
Lymphocytes % Auto: 25 %
Lymphocytes Abs Auto: 1.6 K/MM3 (ref 1.0–4.8)
MCH: 29 pg (ref 27.0–33.0)
MCHC: 33.4 % (ref 32.0–36.0)
MCV: 86.9 fL (ref 80.0–100.0)
MPV: 8.4 fL (ref 6.8–10.0)
Monocytes % Auto: 7.6 %
Monocytes Abs Auto: 0.5 K/MM3 (ref 0.1–0.8)
Neutrophils % Auto: 65.1 %
Neutrophils Abs Auto: 4.1 K/MM3 (ref 1.8–7.7)
Platelet Count: 176 K/MM3 (ref 130–400)
RDW: 14.8 % — ABNORMAL HIGH (ref 0.0–14.7)
Red Blood Cell Count: 3.55 M/MM3 — ABNORMAL LOW (ref 4.00–5.20)
White Blood Cell Count: 6.2 K/MM3 (ref 4.5–11.0)

## 2023-11-18 LAB — LIPASE: Lipase: 75 U/L — ABNORMAL HIGH (ref 13–60)

## 2023-11-18 NOTE — Progress Notes (Signed)
 [place] [facility]  [facility]    ASSESSMENT and PLAN:  1. Hospital discharge follow-up  2. Melena  Urgent referral to GI placed for endoscopy. 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 No melena since Sunday. Will also refer to GI for consideration of colonoscopy given history of 3 tubular adenomas in the 2022. Will check H/H.    - Lipase; Future  - Comprehensive Metabolic Panel; Future  - CBC with Differential; Future  - GASTROENTEROLOGY REFERRAL; Future  - Message sent to patient's hepatologist as FYI.   - ER precautions for recurrence of bleeding     3. Left lower quadrant abdominal pain  4. Constipation, unspecified constipation type  Incidental finding on exam. Recent CT scan negative for any acute pathology, but notable for diverticulosis. Suspect pain is due to constipation.   - Continue Miralax 1 to 2 times daily for constipation.   - ER precautions reviewed.           Chief Complaint:   Chief Complaint   Patient presents with    Hospital Discharge     er f/[facility] [place] seen for black stool seen on 11/16/23 discharge same day         History of Present Illness:  Paula Daugherty is a 78yr old female who presents for ER follow up. She is joined by her husband, RENOLD [employer] on 11/16/23 for melena that had been present for 3-4 days.   She reports she had labs and CT scan.   She was told that she needed an endoscopy and this could be done outpatient.   Last Hemoglobon at [facility] 12.2 on 10/15/23  10.3 at [facility]   She is on plavix  for history of stroke in 2024.   Last melena was on Sunday. No BM since then.   She has been eating ok.   Mild occasional abdominal pain.   History of cirrhosis. Followed by Dr. Glynn.     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    Medication Therapy Auth 03/19/2016    Liver fibrosis 12/12/2015    Ptosis of eyelid 08/06/2015    Cataract 12/18/2014    History of actinic keratoses 12/06/2014    Macroalbuminuric diabetic nephropathy (HCC) 09/12/2014    Disorder of liver 08/28/2014    Cough 12/14/2013    Renal cyst ( 6.4 cm cyst left kidney) 08/24/2013    Hyperlipidemia with target LDL less than 70 07/28/2013    Abnormal LFTs 07/28/2013    Diabetes (HCC) 07/11/2013    Depression with anxiety 07/11/2013    Stress at home 07/11/2013    Hypertension 07/11/2013    GERD (gastroesophageal reflux disease) 07/11/2013       Current Outpatient Medications   Medication Sig  Albuterol  (PROAIR  HFA, PROVENTIL  HFA, VENTOLIN  HFA) 90 mcg/actuation inhaler INHALE 1 TO 2 PUFFS BY MOUTH EVERY 6 HOURS AS NEEDED FOR WHEEZING    Amlodipine  (NORVASC) 10 mg Tablet Take 1 tablet by mouth every day.    Atorvastatin  (LIPITOR) 20 mg Tablet Take 1 tablet by mouth every day at bedtime.    Blood Sugar Diagnostic (ONETOUCH ULTRA TEST) Strips Use to test blood glucose 2x/day    CloNIDine  (CATAPRES ) 0.2 mg Tablet Take 1 tablet by mouth 2 times daily. Indications: change of life signs    Clopidogrel  (PLAVIX ) 75 mg Tablet Take 1 tablet by mouth every morning.    Diclofenac  (VOLTAREN ARTHRITIS PAIN) 1 % Gel Apply to the affected area 4 times daily.    Duloxetine  40 mg DR Capsule Take 1 capsule by mouth every day.    Losartan  (COZAAR ) 100 mg tablet Take 1 tablet by mouth every day.    Metformin  (GLUCOPHAGE -XR) 500 mg XR tablet Take 4 tablets by mouth every evening with  a meal.    NYSTATIN  100,000 unit/gram Powder Apply a thin layer of powder to affected area 4 times a day as needed    Ondansetron  (ZOFRAN -ODT) 8 mg disintegrating tablet Take 1 tablet by mouth every 8 hours if needed.     No current facility-administered medications for this visit.        OBJECTIVE:   BP 112/55 (SITE: left arm, Orthostatic Position: sitting, Cuff Size: regular)   Pulse 64   Temp 36.2 C (97.1 F) (Skin)   Ht 1.575 m (5' 2)   Wt 72.4 kg (159 lb 9.8 oz)   LMP 05/06/1983   SpO2 100%   BMI 29.19 kg/m    Physical Exam  Vitals reviewed.   Constitutional:       Appearance: Normal appearance. She is well-developed.   HENT:      Head: Normocephalic.      Right Ear: External ear normal.      Left Ear: External ear normal.   Eyes:      General:         Right eye: No discharge.         Left eye: No discharge.      Conjunctiva/sclera: Conjunctivae normal.   Cardiovascular:      Rate and Rhythm: Normal rate and regular rhythm.      Heart sounds: No murmur heard.  Pulmonary:      Effort: Pulmonary effort is normal.      Breath sounds: Normal breath sounds. No wheezing or rales.   Abdominal:      General: There is no distension.      Palpations: Abdomen is soft. There is no mass.      Tenderness: There is abdominal tenderness in the left lower quadrant. There is no guarding or rebound.   Neurological:      Mental Status: She is alert and oriented to person, place, and time.   Psychiatric:         Behavior: Behavior normal.         Barriers to Learning assessed: none. Patient verbalizes understanding of teaching and instructions.  Education Needs Identified?   no  Patient was counseled specifically on the following: instructions for management and/or follow-up    I spent 20 minutes with the patient, >50% minutes of which were spent counseling her.    Epifanio Polite, MD  [facility]

## 2023-11-18 NOTE — Nursing Note (Signed)
 Paula Daugherty has been identified by name, and date of birth.  Paula Daugherty is here with Spouse DJ    Patient's chief complaint noted.     I have verified and updated the following:  Allergy   Tobacco   Preferred pharmacy  PHQ-2/PHQ9       Health Maintenance(Care Gaps) reviewed with patient     Patient is not due for Advanced Care Planning  - Place ACP documents on counter with green rooming sheet if due.  Provider will have the conversation    Patient is not due for A1C  - Complete POC A1C in clinic if due.  If patient is going to lab TODAY, okay to decline POC. Document reason if patient declines.      Patient is not due for Chlamydia Screen - Pend order from HM tab and collect 1st catch urine if due. If patient is here for a urinary symptom, please do not collect GC/Chlamydia sample as a clean catch sample is more appropriate for care.  Document if patient declined UA sample with the reason.      Patient is not due for Retinopathy Screen - Schedule or complete exam today, document if declined.  If completed, please request medical record and complete HM with date of completion per patient.    Vital signs documented below.    Andrea Mania, [place]    Temp: 36.2 C (97.1 F) (07/16 0953)  Temp src: Skin (07/16 0953)  Pulse: 64 (07/16 0953)  BP: 112/55 (07/16 0953)  Resp: --  SpO2: 100 % (07/16 0953)  Height: 157.5 cm (5' 2) (07/16 9046)  Weight: 72.4 kg (159 lb 9.8 oz) (07/16 0953)

## 2023-11-19 ENCOUNTER — Other Ambulatory Visit: Payer: Self-pay | Admitting: Family Medicine

## 2023-11-19 DIAGNOSIS — K921 Melena: Secondary | ICD-10-CM

## 2023-11-20 ENCOUNTER — Telehealth: Payer: Self-pay

## 2023-11-20 ENCOUNTER — Telehealth: Payer: Self-pay | Admitting: "Endocrinology

## 2023-11-20 ENCOUNTER — Other Ambulatory Visit: Payer: Self-pay

## 2023-11-20 NOTE — Telephone Encounter (Signed)
 Called pt verified with 2 pt identifiers     Confirmed procedure date, time and location, prep instructions thoroughly reviewed with patient.       Patient aware they will need to arrange for a driver (either a family member or friend) that can accept responsibility in assisting them home and to remain with them post procedure if under moderate sedation and overnight if under General Anesthesia.     Patient aware: For own safety and the safety of others, pt must have a responsible driver (either a family member or friend) who will either wait at the clinic or be available to return to the clinic within 15 minutes of a phone call to drive you home.    NO bus, taxi, [place], [place] Goad allowed unless pt is with an adult friend or family member. No driving the remainder of the day.    Patient  is aware that if pt is utilizing their health insurance to setup your transportation, transportation must be a "medical transport", of not then pt must have, a responsible adult (a family member or friend) must escort pt home as well."      Patient  can expect to be here about 2-3 hours. Pt agreed to review provided GI instructions that were previously sent and follow the procedure prep carefully. If taking any prescription blood thinners they will need to consult with prescribing physician, provided our recommendation per type of blood thinner pt is taking, pt is aware to hold all NSAIDS (ibuprofen, Aleve, Advil, Motrin).     Pt made aware to stop Jello and Broth 8 hrs prior to procedure     Patient provided GI lab contact telephone [phone] and will call back with any further questions or concerns.    Pt verbalized understanding of instructions.           Bunnie Philips, LVN  [facility]

## 2023-11-20 NOTE — Telephone Encounter (Signed)
 Incoming Call  Patient Verification Completed    Patient requesting: Other      Caller Identity and Name:Patient called in stating he has a procedure scheduled for 11/26/23 and wanting to know how she needs to take her Metformin . Please advise.    Clayborne Ranae LAWMAN II-[facility]  Patient [employer]  Main Line: [phone] (Patient Line)  Direct Line: [phone]

## 2023-11-23 ENCOUNTER — Ambulatory Visit: Payer: Medicare Other | Admitting: Student in an Organized Health Care Education/Training Program

## 2023-11-23 MED ORDER — PEG 3350-ELECTROLYTES 236 GRAM-22.74 GRAM-6.74 GRAM-5.86 GRAM SOLUTION
ORAL | 0 refills | Status: DC
Start: 2023-11-23 — End: 2024-01-20

## 2023-11-23 NOTE — H&P (Signed)
 .GASTROENTEROLOGY PRE-PROCEDURE HISTORY & PHYSICAL    Location:   [facility] ENDOSCOPY SUITE  [address]  [place] [address]  [phone]    Date of Service: 11/26/2023    Referring Provider:  Irven Hamburger, MD    Procedure:  Upper endoscopy (EGD) and Colonoscopy    Attending: Marolyn Jakob, MD    HPI: The patient's past medical, family, and social history was reviewed.    Paula Daugherty is a 78yr old:    Referred for melena and surveillance colonoscopy    No family hx of colon cancer    No blood thinners    Lab Results   Component Value Date    PLT 176 11/18/2023    INR 1.0 10/15/2023    HGB 10.3 (L) 11/18/2023    K 4.9 11/18/2023    CA 9.3 11/18/2023    CR 1.14 11/18/2023    GFR 50 11/18/2023       Estimated body mass index is 29.19 kg/m as calculated from the following:    Height as of 11/18/23: 1.575 m (5' 2).    Weight as of 11/18/23: 72.4 kg (159 lb 9.8 oz).    Current Outpatient Medications   Medication Instructions    Albuterol  (PROAIR  HFA, PROVENTIL  HFA, VENTOLIN  HFA) 90 mcg/actuation inhaler INHALE 1 TO 2 PUFFS BY MOUTH EVERY 6 HOURS AS NEEDED FOR WHEEZING    Amlodipine  (NORVASC) 10 mg, ORAL, DAILY (OUTPATIENT)    Atorvastatin  (LIPITOR) 20 mg, ORAL, DAILY AT BEDTIME    Blood Sugar Diagnostic (ONETOUCH ULTRA TEST) Strips Use to test blood glucose 2x/day    CloNIDine  (CATAPRES ) 0.2 mg, ORAL, TWO TIMES DAILY    Clopidogrel  (PLAVIX ) 75 mg, ORAL, DAILY MORNING    Diclofenac  (VOLTAREN ARTHRITIS PAIN) 1 % Gel TOPICAL, FOUR TIMES DAILY    DULoxetine  40 mg, ORAL, DAILY (OUTPATIENT)    Losartan  (COZAAR ) 100 mg, ORAL, DAILY (OUTPATIENT)    Metformin  (GLUCOPHAGE -XR) 2,000 mg, ORAL, DAILY EVENING WITH MEAL    NYSTATIN  100,000 unit/gram Powder Apply a thin layer of powder to affected area 4 times a day as needed    Ondansetron  (ZOFRAN -ODT) 8 mg, ORAL, EVERY 8 HOURS IF NEEDED    PEG-Electrolyte Soln (COLYTE, GOLYTELY) 236-22.74-6.74 -5.86 gram Liquid Take as directed by [facility]. Do not follow instructions on the box. Disp-4000 mL, R-0, Pharmacy okay to dispense Gavilyte, C, G, or N. Also OK to dispense Nulytely, generic Moviprep, Colyte, or Golytely.       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     IMMEDIATE PRE-SEDATION ASSESSMENT  Pre-Sedation/Anesthesia Assessment:    2 - Mild, controlled systemic disease and no functional limitations    Airway Assessment:    Mallampati class 2    ROM normal    Impression/Plan:     Egd/colonoscopy    Patient is a candidate for moderate sedation.  The nature of moderate sedation was reviewed with the patient, including that it is moderate but not deep sedation. .    The procedure, risks, benefits, and alternatives were explained.  All patient questions were answered.  The informed consent was signed.    Patient barriers to learning: none    Patient/family understanding:  verbalizes    A [facility] interpreter was not used.

## 2023-11-26 ENCOUNTER — Ambulatory Visit
Admission: RE | Admit: 2023-11-26 | Discharge: 2023-11-26 | Disposition: A | Discharge status: Home / Self Care | Source: Ambulatory Visit

## 2023-11-26 VITALS — BP 145/74 | HR 62 | Temp 97.6°F | Resp 10 | Ht 62.0 in | Wt 155.0 lb

## 2023-11-26 DIAGNOSIS — K5730 Diverticulosis of large intestine without perforation or abscess without bleeding: Secondary | ICD-10-CM | POA: Insufficient documentation

## 2023-11-26 DIAGNOSIS — K644 Residual hemorrhoidal skin tags: Secondary | ICD-10-CM | POA: Insufficient documentation

## 2023-11-26 DIAGNOSIS — K648 Other hemorrhoids: Secondary | ICD-10-CM | POA: Insufficient documentation

## 2023-11-26 DIAGNOSIS — K31819 Angiodysplasia of stomach and duodenum without bleeding: Secondary | ICD-10-CM | POA: Insufficient documentation

## 2023-11-26 DIAGNOSIS — D120 Benign neoplasm of cecum: Secondary | ICD-10-CM | POA: Insufficient documentation

## 2023-11-26 DIAGNOSIS — K921 Melena: Secondary | ICD-10-CM | POA: Insufficient documentation

## 2023-11-26 DIAGNOSIS — K635 Polyp of colon: Secondary | ICD-10-CM | POA: Insufficient documentation

## 2023-11-26 LAB — POC GLUCOSE: POC GLUCOSE: 161 mg/dL — ABNORMAL HIGH (ref 70–99)

## 2023-11-26 MED ORDER — NALOXONE 0.4 MG/ML INJECTION SOLUTION
0.4000 mg | INTRAMUSCULAR | Status: DC | PRN
Start: 2023-11-26 — End: 2023-12-02

## 2023-11-26 MED ORDER — ATROPINE 0.1 MG/ML INJECTION SYRINGE
0.5000 mg | INJECTION | INTRAMUSCULAR | Status: DC | PRN
Start: 2023-11-26 — End: 2023-12-02

## 2023-11-26 MED ORDER — LIDOCAINE HCL 2 % MUCOSAL SOLUTION
5.0000 mL | Freq: Once | Status: DC
Start: 2023-11-26 — End: 2023-12-02
  Filled 2023-11-26: qty 15

## 2023-11-26 MED ORDER — SIMETHICONE 40 MG/0.6 ML ORAL DROPS,SUSPENSION
40.0000 mg | Freq: Once | ORAL | Status: AC
Start: 2023-11-26 — End: 2023-11-26
  Administered 2023-11-26: 80 mg
  Filled 2023-11-26: qty 1.2

## 2023-11-26 MED ORDER — NACL 0.9% IV INFUSION
INTRAVENOUS | Status: DC
Start: 2023-11-26 — End: 2023-12-02

## 2023-11-26 MED ORDER — ONDANSETRON HCL (PF) 4 MG/2 ML INJECTION SOLUTION
4.0000 mg | INTRAMUSCULAR | Status: DC | PRN
Start: 2023-11-26 — End: 2023-12-02

## 2023-11-26 MED ORDER — FENTANYL (PF) 50 MCG/ML INJECTION SOLUTION
12.5000 ug | INTRAMUSCULAR | Status: DC | PRN
Start: 2023-11-26 — End: 2023-12-02
  Administered 2023-11-26: 150 ug via INTRAVENOUS
  Administered 2023-11-26: 25 ug via INTRAVENOUS
  Filled 2023-11-26: qty 4

## 2023-11-26 MED ORDER — LACTATED RINGERS IV INFUSION
INTRAVENOUS | Status: DC
Start: 2023-11-26 — End: 2023-12-02

## 2023-11-26 MED ORDER — FLUMAZENIL 0.1 MG/ML INTRAVENOUS SOLUTION
0.2000 mg | INTRAVENOUS | Status: DC | PRN
Start: 2023-11-26 — End: 2023-12-02

## 2023-11-26 MED ORDER — MIDAZOLAM 1 MG/ML INJECTION SOLUTION
0.5000 mg | INTRAMUSCULAR | Status: DC | PRN
Start: 2023-11-26 — End: 2023-12-02
  Administered 2023-11-26: 6 mg via INTRAVENOUS
  Filled 2023-11-26: qty 10

## 2023-11-26 MED ORDER — EPINEPHRINE 0.1 MG/ML INJECTION SYRINGE
1.0000 mg | INJECTION | INTRAMUSCULAR | Status: DC | PRN
Start: 2023-11-26 — End: 2023-12-02

## 2023-11-26 MED ORDER — DIPHENHYDRAMINE 50 MG/ML INJECTION SOLUTION
12.5000 mg | INTRAMUSCULAR | Status: DC | PRN
Start: 2023-11-26 — End: 2023-12-02
  Filled 2023-11-26: qty 1

## 2023-11-26 NOTE — Discharge Instructions (Addendum)
.  Discharge Instructions    Findings:  Colonoscopy 2 small benign appearing polyps, and benign pockets in the colon called divertculosis, moderate sized internal and external hemorrhoids  Upper endoscopy showed no ulcers and only GAVE (blood vessels close to surface in your stomach) which I placed 5 rubber bands on    Activity:    Rest today, resume activity tomorrow    Diet:    Liquid diet today, soft diet 1-2 days after and then resume prior diet    Medication:    Resume all usual medications, start plavix  tomorrow    Follow up:   If any biopsies were taken we will notify you of your biopsy results via phone call or letter.If you do not receive notice of your results by three weeks, please call us  at the phone number provided below    During your procedure, air was pumped into your GI tract so your doctor could see clearly to make a diagnosis and/or treat your problem.     Some possible side effects you may experience are:   - Discomfort due to a distended (bloated) abdomen which will subside after a few hours to two days.   - Nausea may be a side effect of the medication and will subside.   - The medications you received may make you dizzy and sleepy, it is important that you do not drive, operate machinery or drink alcohol for at least one day.   - Severe pain is not expected and should be reported    Other side effects may include:  Flex. Sig. Or Colonoscopy: A small amount of diarrhea may follow the exam.  IF noted above that polyps were removed in Colon: A small amount of bleeding is not unusual. If it continues after one day, notify the doctor. If there is a large amount of bright red blood, go to the emergency Room and be seen by a doctor.    If problems and your procedure was the [facility] GI lab please call [phone] otherwise call [phone], Option 3 during business hours Monday through Friday 7:30am to 5:00 pm.  After hours call [phone] and ask to speak to the GI Fellow on call.

## 2023-11-30 DIAGNOSIS — D120 Benign neoplasm of cecum: Secondary | ICD-10-CM

## 2023-12-01 ENCOUNTER — Ambulatory Visit: Payer: Self-pay | Admitting: Family Medicine

## 2023-12-02 ENCOUNTER — Ambulatory Visit: Payer: Self-pay

## 2023-12-04 LAB — SURGICAL PATHOLOGY

## 2023-12-09 ENCOUNTER — Encounter: Payer: Self-pay | Admitting: OBSTETRICS/GYN

## 2023-12-11 ENCOUNTER — Ambulatory Visit: Admitting: OBSTETRICS/GYN

## 2023-12-11 ENCOUNTER — Telehealth: Payer: Self-pay | Admitting: "Endocrinology

## 2023-12-11 NOTE — Telephone Encounter (Signed)
 Advice:    Requesting advice about the prescription of     Metformin  (GLUCOPHAGE -XR) 500 mg XR tablet   .  The dose is 500 mg xr  and the instructions are Take 4 tablets by mouth every evening with a meal. .  They have been taking this medication for n/a and would like a phone call back.      Paula Daugherty with [facility] [phone] -  called in to talk with Paula Lonell Rosaline Brown, MD  about medication stated getting high dose alert.      Paula Daugherty  [id number] - [place]RENA CORD[place], [place] - [address] AT [facility] Paula Daugherty [place], [phone] [facility] [phone] FX         Paula Daugherty  PSR II  [facility]

## 2023-12-15 MED ORDER — CLONIDINE HCL 0.2 MG TABLET
0.2000 mg | ORAL_TABLET | Freq: Two times a day (BID) | ORAL | 3 refills | Status: AC
Start: 2023-12-15 — End: 2024-12-09

## 2023-12-16 NOTE — Telephone Encounter (Signed)
 I called [facility] and spoke to [place]. Confirmed that there is not a high dose alert for this medication, rather a clarification that the patient should take 4 tablets (2000 mg) per day. Confirmed that this is the correct dose, that this is the maximum daily dose of metformin  XR.     Jayvan Mcshan, M.D.  Clinical Associate Professor  Department of Endocrinology

## 2023-12-17 ENCOUNTER — Telehealth: Payer: Self-pay | Admitting: "Endocrinology

## 2023-12-17 NOTE — Telephone Encounter (Signed)
 Paperwork from [employer]- Test Strips  was received and placed in [place]  box on 12/17/2023 for MD to complete.    Thank You    Avelina Maid, MA

## 2023-12-22 ENCOUNTER — Ambulatory Visit: Payer: Medicare Other | Admitting: "Endocrinology

## 2023-12-22 NOTE — Telephone Encounter (Signed)
 Paperwork from  [employer]-   was completed by MD and was faxed to  [facility]-   on 12/22/2023    Thank you,    Avelina Maid, MA

## 2023-12-25 ENCOUNTER — Encounter: Payer: Self-pay | Admitting: "Endocrinology

## 2023-12-25 ENCOUNTER — Other Ambulatory Visit: Payer: Self-pay | Admitting: "Endocrinology

## 2023-12-25 DIAGNOSIS — E1129 Type 2 diabetes mellitus with other diabetic kidney complication: Secondary | ICD-10-CM

## 2023-12-25 MED ORDER — PEN NEEDLE, DIABETIC 31 GAUGE X 5/16": 11 refills | Status: DC

## 2023-12-25 MED ORDER — LANTUS SOLOSTAR U-100 INSULIN 100 UNIT/ML (3 ML) SUBCUTANEOUS PEN: 10.0000 [IU] | PEN_INJECTOR | Freq: Every day | SUBCUTANEOUS | 12 refills | Status: DC

## 2024-01-07 ENCOUNTER — Ambulatory Visit: Admitting: Student in an Organized Health Care Education/Training Program

## 2024-01-11 ENCOUNTER — Ambulatory Visit: Payer: Self-pay | Admitting: Student in an Organized Health Care Education/Training Program

## 2024-01-11 ENCOUNTER — Encounter: Payer: Self-pay | Admitting: Student in an Organized Health Care Education/Training Program

## 2024-01-11 ENCOUNTER — Ambulatory Visit (INDEPENDENT_AMBULATORY_CARE_PROVIDER_SITE_OTHER): Admitting: Student in an Organized Health Care Education/Training Program

## 2024-01-11 ENCOUNTER — Ambulatory Visit
Admission: RE | Admit: 2024-01-11 | Discharge: 2024-01-11 | Disposition: A | Discharge status: Home / Self Care | Source: Ambulatory Visit | Attending: Diagnostic Radiology | Admitting: Diagnostic Radiology

## 2024-01-11 ENCOUNTER — Ambulatory Visit (INDEPENDENT_AMBULATORY_CARE_PROVIDER_SITE_OTHER)

## 2024-01-11 VITALS — BP 136/70 | HR 79 | Temp 97.3°F | Ht 62.0 in | Wt 159.6 lb

## 2024-01-11 DIAGNOSIS — R053 Chronic cough: Secondary | ICD-10-CM

## 2024-01-11 DIAGNOSIS — R0602 Shortness of breath: Secondary | ICD-10-CM

## 2024-01-11 DIAGNOSIS — G629 Polyneuropathy, unspecified: Secondary | ICD-10-CM | POA: Insufficient documentation

## 2024-01-11 DIAGNOSIS — Z794 Long term (current) use of insulin: Secondary | ICD-10-CM

## 2024-01-11 DIAGNOSIS — L509 Urticaria, unspecified: Secondary | ICD-10-CM | POA: Insufficient documentation

## 2024-01-11 DIAGNOSIS — E1129 Type 2 diabetes mellitus with other diabetic kidney complication: Secondary | ICD-10-CM

## 2024-01-11 DIAGNOSIS — K7581 Nonalcoholic steatohepatitis (NASH): Secondary | ICD-10-CM | POA: Insufficient documentation

## 2024-01-11 LAB — COMPREHENSIVE METABOLIC PANEL
Alanine Transferase (ALT): 43 U/L — ABNORMAL HIGH (ref <=33)
Albumin: 4.7 g/dL (ref 4.0–4.9)
Alkaline Phosphatase (ALP): 152 U/L — ABNORMAL HIGH (ref 35–129)
Anion Gap: 15 mmol/L (ref 7–15)
Aspartate Transaminase (AST): 40 U/L (ref <=41)
Bilirubin Total: 1 mg/dL (ref <=1.2)
Calcium: 9.8 mg/dL (ref 8.6–10.0)
Carbon Dioxide Total: 23 mmol/L (ref 22–29)
Chloride: 100 mmol/L (ref 98–107)
Creatinine Serum: 1.37 mg/dL — ABNORMAL HIGH (ref 0.51–1.17)
E-GFR Creatinine (Female): 40 mL/min/1.73m*2
Glucose: 160 mg/dL — ABNORMAL HIGH (ref 74–109)
Potassium: 5 mmol/L (ref 3.4–5.1)
Protein: 8.9 g/dL — ABNORMAL HIGH (ref 6.6–8.7)
Sodium: 138 mmol/L (ref 136–145)
Urea Nitrogen, Blood (BUN): 24 mg/dL — ABNORMAL HIGH (ref 6–20)

## 2024-01-11 LAB — CBC WITH DIFFERENTIAL
Basophils % Auto: 0.6 %
Basophils Abs Auto: 0 K/MM3 (ref 0.0–0.2)
Eosinophils % Auto: 1.5 %
Eosinophils Abs Auto: 0.1 K/MM3 (ref 0.0–0.5)
Hematocrit: 36.3 % (ref 36.0–46.0)
Hemoglobin: 11.5 g/dL — ABNORMAL LOW (ref 12.0–16.0)
Lymphocytes % Auto: 18.6 %
Lymphocytes Abs Auto: 1.5 K/MM3 (ref 1.0–4.8)
MCH: 25.6 pg — ABNORMAL LOW (ref 27.0–33.0)
MCHC: 31.7 % — ABNORMAL LOW (ref 32.0–36.0)
MCV: 80.8 fL (ref 80.0–100.0)
MPV: 8.1 fL (ref 6.8–10.0)
Monocytes % Auto: 8.7 %
Monocytes Abs Auto: 0.7 K/MM3 (ref 0.1–0.8)
Neutrophils % Auto: 70.6 %
Neutrophils Abs Auto: 5.7 K/MM3 (ref 1.8–7.7)
Platelet Count: 252 K/MM3 (ref 130–400)
RDW: 16.5 % — ABNORMAL HIGH (ref 0.0–14.7)
Red Blood Cell Count: 4.49 M/MM3 (ref 4.00–5.20)
White Blood Cell Count: 8.1 K/MM3 (ref 4.5–11.0)

## 2024-01-11 LAB — FERRITIN: Ferritin: 14 ng/mL (ref 13–150)

## 2024-01-11 MED ORDER — ALBUTEROL SULFATE HFA 90 MCG/ACTUATION AEROSOL INHALER: 2.0000 | INHALATION_SPRAY | RESPIRATORY_TRACT | 2 refills | Status: AC | PRN

## 2024-01-11 NOTE — Progress Notes (Signed)
 [facility] [place]  Family Medicine     Chief Complaint   Patient presents with    Cough     Ongoing patient states getting worse    Foot Pain    Itching     Hands and arms         ASSESSMENT and PLAN:    1. Chronic cough    2. Shortness of breath    3. Urticaria    4. Type 2 diabetes mellitus with other diabetic kidney complication, with long-term current use of insulin  (HCC)    5. Neuropathy         Assessment & Plan  Chronic cough and shortness of breath  Chronic cough with recent worsening and exertional dyspnea. Possible low hemoglobin contributing.  - Order chest x-ray.  - Order pulmonary function testing.  - Refer to pulmonology.  - Order CBC and CMP.  - Refill inhaler.  - Consider CT of chest if chest x-ray findings are abnormal or pending PFT results    Recurrent urticaria (hives) of arms and hands  Recurrent hives on arms and hands with no clear trigger. Differential includes idiopathic urticaria or allergic reaction.  - Refer to allergy and immunology.    Type 2 diabetes mellitus with diabetic neuropathy and poor glycemic control  Type 2 diabetes with poor glycemic control and neuropathic symptoms. Insulin  initiation planned to reduce blood sugar. Discussed potential use of GLP-1 agonists for long-term management and additional benefits.  - Start insulin  10 units as planned with endocrinology..  - Check blood sugar regularly.  - Send message to Doctor [facility] regarding GLP-1 agonist.  - Monitor neuropathic symptoms and blood sugar levels.    Modified Medications    Modified Medication Previous Medication    ALBUTEROL  (PROAIR  HFA, PROVENTIL  HFA, VENTOLIN  HFA) 90 MCG/ACTUATION INHALER Albuterol  (PROAIR  HFA, PROVENTIL  HFA, VENTOLIN  HFA) 90 mcg/actuation inhaler       Take 2-4 puffs by inhalation every 4 to 6 hours if needed for wheezing.    INHALE 1 TO 2 PUFFS BY MOUTH EVERY 6 HOURS AS NEEDED FOR WHEEZING        SUBJECTIVE:  Paula Daugherty is a 78yr old female  here for chief complaint noted  above.    History of Present Illness  Paula Daugherty is a 78 year old female with diabetes who presents with a chronic cough, itching, and foot pain.    She has a chronic cough for several months, worsening recently, with both productive and dry episodes. Shortness of breath occurs with exertion, and she uses an inhaler for relief. Fatigue and wheezing follow exertion. No asthma history.    Itching with welting and redness affects her arms and hands once or twice weekly for several months, sometimes with hand swelling. She uses hydrocortisone , Benadryl , and calamine lotion. No known triggers. She had eczema many years ago but believes current symptoms resemble hives.    Blood sugar levels are consistently high, with recent readings of 215 mg/dL. She monitors levels two to three times daily and takes 2000 mg of metformin  SR daily.    She experiences sharp, searing pain in the bottoms of her feet and toes, occurring sporadically, described as nerve-like, sometimes with numbness and coldness.    ROS:  As noted in HPI. 10 point review of systems was otherwise negative.     Current Outpatient Medications   Medication Sig    albuterol  (Proair  HFA, Proventil  HFA, Ventolin  HFA) 90 mcg/actuation inhaler Take 2-4  puffs by inhalation every 4 to 6 hours if needed for wheezing.    Amlodipine  (NORVASC) 10 mg Tablet Take 1 tablet by mouth every day.    Atorvastatin  (LIPITOR) 20 mg Tablet Take 1 tablet by mouth every day at bedtime.    Blood Sugar Diagnostic (ONETOUCH ULTRA TEST) Strips Use to test blood glucose 2x/day    CloNIDine  (CATAPRES ) 0.2 mg Tablet Take 1 tablet by mouth 2 times daily. Indications: change of life signs    Clopidogrel  (PLAVIX ) 75 mg Tablet Take 1 tablet by mouth every morning.    Diclofenac  (VOLTAREN ARTHRITIS PAIN) 1 % Gel Apply to the affected area 4 times daily.    insulin  glargine (Lantus  Solostar U-100 Insulin ) 100 unit/mL (3 mL) pen Inject 10 Units subcutaneously every day.    Losartan  (COZAAR ) 100 mg  tablet Take 1 tablet by mouth every day.    Metformin  (GLUCOPHAGE -XR) 500 mg XR tablet Take 4 tablets by mouth every evening with a meal.    NYSTATIN  100,000 unit/gram Powder Apply a thin layer of powder to affected area 4 times a day as needed    Ondansetron  (ZOFRAN -ODT) 8 mg disintegrating tablet Take 1 tablet by mouth every 8 hours if needed.    PEG-Electrolyte Soln (COLYTE, GOLYTELY) 236-22.74-6.74 -5.86 gram Liquid Take as directed by [facility]. Do not follow instructions on the box. Disp-4000 mL, R-0, Pharmacy okay to dispense Gavilyte, C, G, or N. Also OK to dispense Nulytely, generic Moviprep, Colyte, or Golytely.    pen needle, diabetic (BD Ultra-Fine III Short Pen Needle) 31 gauge x 5/16 Use for injecting insulin  1 times per day     No current facility-administered medications for this visit.        OBJECTIVE:   BP 136/70 (SITE: right arm, Orthostatic Position: sitting, Cuff Size: regular)   Pulse 79   Temp 36.3 C (97.3 F) (Temporal)   Ht 1.575 m (5' 2)   Wt 72.4 kg (159 lb 9.8 oz)   LMP 05/06/1983   SpO2 100%   BMI 29.19 kg/m      Physical Exam      Gen: NAD, appears well   HEENT: EOMI, sclera clear, anicteric   Neck: trachea midline, no thyromegaly   Chest: CTAB, no wheezing   CV: RRR, no murmurs   Extrem: no cyanosis or edema, pulses palpable   Skin: no rashes or lesions   Neuro: no focal deficits   Psych: normal mood and affect        Results  LABS  Blood Glucose: 215 (01/11/2024)  A1c: 6.8    DIAGNOSTIC  Upper Endoscopy: Five bands placed, no bleeding, gastric antral vascular ectasia, tattoo intact, no malignant features  Colonoscopy: Normal, benign polyps removed          01/11/2024     9:06 AM   PHQ-2   Interest 1   Feeling 1   PHQ-2 Total Score 2     No question data found.     Depression Treatment Plan:  see assessment/plan section of note      No follow-ups on file.    I did review patient's past medical and family/social history, no changes noted.  Barriers to Learning assessed:  none. Patient verbalizes understanding of teaching and instructions.  Education Needs Identified?   no    Patient was counseled specifically on the following: diagnostic results, impressions, and/or recommended diagnostic studies reviewed, instructions for management and/or follow-up, risk factor reduction and patient and family  education    I spent 30 minutes with the patient, >50% of which were spent counseling her regarding differential, additional testing, if indicated, potential treatment options, and lifestyle modifications, if applicable    Today's visit has complexity inherent to evaluation and management associated with medical care services that serve as the continuing focal point for all needed health care services.    I obtained verbal consent from the patient to use AI ambient technology to transcribe the interactions between the patient and myself during the clinical encounter.    Electronically signed by:  Logan Tawni Silvan, MD  Associate Physician  [facility]

## 2024-01-12 ENCOUNTER — Encounter: Payer: Self-pay | Admitting: Student in an Organized Health Care Education/Training Program

## 2024-01-12 NOTE — Progress Notes (Deleted)
 CDI Query    ADDITIONAL CLARIFICATION REQUESTED    By submitting this query, we are seeking further clarification of documentation for the current year, to accurately reflect all conditions that you are monitoring, evaluating, and/or treating, to its greatest specificity, on 03/17/2024 during your face-to-face patient encounter.     Please select your response from the options provided below. In addition, please include diagnosis status. Thank you.    CLINICAL FINDINGS:  01/11/2024 Family Practice/Internal Medicine progress note visit diagnoses:  Type 2 diabetes mellitus with other diabetic kidney complication, with long-term current use of insulin     The most recently recorded eGFR values follow:          Date eGFR  01/11/2024    40  11/18/2023    50  10/15/2023    54        [employer] guides for CKD staging  Stage 1: GFR > 90  Stage 2: GFR 60-89  Stage 3: GFR 30-59    Stage 3a: GFR 45-59    Stage 3b: GFR 30-44  Stage 4: GFR 15-29  Stage 5: GFR < 15  ESRD: Need for dialysis    RISK FACTORS:  Diabetes mellitus type 2, 5 years age    TREATMENT:  Monitoring Labs: CMP  Management of diabetes: Medications include Metformin  and  Lantus  Insulin     Please exercise your independent professional judgment in your response and include it in your progress note as applicable.    QUESTION: Based on your medical judgment and review of the clinical indicators and findings, can the patient's coexisting condition be clarified or further specified as:    Chronic kidney disease, stage 3a  Chronic kidney disease, stage 3b  (if applicable, please further specify the status of the Dx, e.g., well controlled, resolving, resolved, or having an exacerbation, progression, or side effect)  Other explanation/diagnosis: (Please document and include support.)  Clinically undetermined  Not addressed at today's encounter    ANSWER:     Please include supporting documentation in your note to indicate patient diagnosis status (e.g.,  well controlled, resolving, resolved, or having an exacerbation, progression, or side effect).    (To close this note, please click Accept)    Respectfully,     Stevens Bumps, MSN, RN, CCDS-O, CRC  Clinical Documentation Improvement: Outpatient  Health Information Management (HIM) Division   [place]  [facility]  [email or web address]  C. [phone]

## 2024-01-12 NOTE — Telephone Encounter (Signed)
 CONSULT PROVIDER:  Please review patient's message regarding 01/11/24 lab results.

## 2024-01-13 ENCOUNTER — Encounter: Payer: Self-pay | Admitting: GASTROENTEROLOGY

## 2024-01-13 ENCOUNTER — Ambulatory Visit

## 2024-01-13 VITALS — BP 112/57 | HR 58 | Temp 97.3°F | Ht 62.0 in | Wt 163.8 lb

## 2024-01-13 DIAGNOSIS — R0681 Apnea, not elsewhere classified: Secondary | ICD-10-CM

## 2024-01-13 DIAGNOSIS — R0683 Snoring: Secondary | ICD-10-CM

## 2024-01-13 DIAGNOSIS — F515 Nightmare disorder: Secondary | ICD-10-CM | POA: Insufficient documentation

## 2024-01-13 DIAGNOSIS — L299 Pruritus, unspecified: Secondary | ICD-10-CM | POA: Insufficient documentation

## 2024-01-13 DIAGNOSIS — G3184 Mild cognitive impairment, so stated: Secondary | ICD-10-CM | POA: Insufficient documentation

## 2024-01-13 DIAGNOSIS — R2689 Other abnormalities of gait and mobility: Secondary | ICD-10-CM | POA: Insufficient documentation

## 2024-01-13 DIAGNOSIS — F332 Major depressive disorder, recurrent severe without psychotic features: Secondary | ICD-10-CM | POA: Insufficient documentation

## 2024-01-13 NOTE — Nursing Note (Signed)
 Identify by two identifiers vitals taken, allergies and pain level verified,tobacco usage, pharmacy updated.   Patient WAS not wearing a surgical mask      Najia Hurlbutt Jaci Standard MA

## 2024-01-13 NOTE — Patient Instructions (Addendum)
 Thank you for visiting the Clinical Neurosciences Department at the [facility]  [facility] today.     The neurologist(s) you saw today was(were):              Resident physician: Dr. Lyndy              Attending (supervising) physician: Dr. Shiela      Recommendations made during today's visit include:  - Obtain Labs today  - Sleep medicine referral for sleep problems  - Neuropsychiatric testing for memory concerns  - Talk to your primary care provider about depression Establish with a therapist management and establishing with a therapist  - Reduce your use of Benadryl . Talk to PCP/Allergist about alternative options for itching  - Try melatonin 3 mg at bedtime   - Physical therapy referral      The following diagnostic tests have been ordered to help us  better determine the underlying cause of your condition as well as guide best treatment:              - Blood work    For any radiology tests (CT, MRI, and Nuclear medicine scans), please call to schedule an appointment at [phone]    The following changes to your medication regimen have been made:              - None     The following referrals have been generated through the [facility] system:              - Sleep study   - Neuropsychiatry    - Physical therapy     Return to Clinic: 6 month  PLEASE COME 15 MINUTES PRIOR TO SCHEDULED APPOINTMENT TIME    Thank you for choosing the Neurology department at the [facility] [place]  [facility].       --------------------------------------------------------------------------------------------------------     If you have any questions, please feel free to ask us !    1. By telephone.  You can reach your neurologist by calling the [facility] neurology clinic at [phone].    2. Online. We also encourage you to register for [facility], which allows you online computer access to your medical chart, as well as an opportunity to e-mail your doctor regarding non-urgent  issues.  You can sign up by visiting the following link: [email or web address]     MyChart Rules:   MyChart is not like text-messaging. Please consolidate messages to one message. It can take several days for messages to get sorted and sent to the medical provider depending on the volume of messages sent to the clinic. The nurses work very hard to sort through these messages in a timely manner.   If your question needs urgent answers within 1 week (like medication refills), please call the nursing line at [phone].   If your question can wait 1 week or more, MyChart messages can be used.   If your message or question is more than a few lines long, then please call the appointment line at [phone] and request a follow up appointment.   Please allow at least 1 full week for paperwork to be completed.

## 2024-01-13 NOTE — Progress Notes (Unsigned)
 PATIENT:  Paula Daugherty  MRN:         [id number]  DATE:        01/13/2024      Dear Dr. Brien:    Our mutual patient, Paula Daugherty, was seen today at the [facility] in referral for Memory concerns.  A [place] interpreter was not used during this encounter.    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 Regarding stroke patient has no current residual deficits. Reports memory issues and word finding difficulty. Patient is independent with ADL's and all iADLs. Memory problems started after stroke. States forgetting conversations she has had recently. Has slowly been worsening. Patient had neuropsych testing in June 2024.    Sleep fluctuates, will have to take Benadryl  at night for itching that keeps her up. Does not nap throughout the day. Snores and husband notes gasping suggestive of apnea. Completed sleep study in 2015. Has had bad dreams her whole life that patiet attributes to PTSD and traumatic childhood. Patient states will wake up screaming, crying, fighting. States has been happening for decades. No decreased smell. Balance states has also been poor fell 2 weeks ago when she was trying to get into a hotel bed. No presyncopal symptoms. Occasional lightheadedness. No gait problems. Has drooling. Denies difficulty swallowing.     Patient also reports her mood is not good, no social life. Feels depressed most days.     Depth perception, hesitating with step down, missing the bed, mis-coordinating how high needs to step to get in the car. No vision problems, denies blurry vision, diplopia.    Cannot have MRIs due to stapendectomy with wire prosthesis of uncertain MRI compatibility.     REVIEW OF SYSTEMS:  A 10-system ROS  was performed and is negative except for those items noted in the HPI     PAST MEDICAL & SURGICAL HISTORY:  Past Medical History[1]  Past Surgical History[2]      MEDICATIONS:  Medications Ordered Prior to Encounter[3]    ALLERGIES:  Allergies[4]    FAMILY HISTORY:  Review of patient's family history indicates:  Problem: Diabetes      Relation: Father          Age of Onset: (Not Specified)  Problem: Heart      Relation: Mother          Age of Onset: (Not Specified)          Comment: MI at 75s   Problem: Non-contributory      Relation: Brother          Age of Onset: (Not Specified)  Problem: Non-contributory      Relation: Brother          Age of Onset: (Not Specified)      SOCIAL HISTORY:   T/E/D: No tobacco 25 years half a pack, social alcohol, denis   Occupation:  Associate Professor   Lives with:  husband [name],     EXAMINATION:    Vitals:  Temp: 36.3 C (97.3 F) (09/10 0928)  Temp src: Skin (09/10 0928)  Pulse: 58 (09/10 0928)  BP: 112/57 (09/10 0928)  Resp: --  SpO2: --  Height: 157.5 cm (5' 2) (09/10 9071)  Weight: 74.3  kg (163 lb 12.8 oz) (09/10 9071)    Physical Exam  GENERAL APPEARANCE: NAD  HEENT: NC/NT. EOMI.The nares are patent.   HEART: well perfused, warm  LUNGS: breathing comfortably on RA   ABDOMEN: not distended  EXTREMITIES: Without cyanosis, clubbing or edema.  Skin: Warm and dry without any obvious rash.      Neurological Exam  Mental Status:   State: Alert, oriented to person, place, time, and situation.  Use of language: Fluent, without latency. No paraphasias. Naming and repetition intact.     Cranial Nerves:  CN 2:        PERRLA. VF full to confrontation.  CN 3,4,6:  EOMI, without pathologic movements.  CN 5:        Sensation to LT full and symmetric in V1/2/3.  CN 7:        Facies symmetric at rest and with excursion.  CN 8:        Hearing intact to voice.  CN 9, 10:  Palate elevation symmetric.  CN 11:      Trapezius strength full and symmetric.  CN 12:      Tongue protrusion  midline.    Motor:  Tone and bulk: Normal tone, no notable atrophy.  Adventitious movements: None observed    Pronator drift: No drift in BUE (>10s) and BLE (>5s)  Strength: 5/5 throughout   Movement: Left fingertaps slight bradykinesia, hands open and close slight bradykinesia  L>R       Deltoid Biceps Triceps Wrist extension Wrist flexion   R 5 5 5 5 5    L 5 5 5 5 5       Hip Flexors Knee Flexors Knee Ext Dorsi- Flexion Plantar- Flexion   R 5 5 5 5 5    L 5 5 5 5 5      Reflexes:   Biceps-C5 BR-C6 Triceps-C7 Patellar-L4 Achilles-S1   R 2+ 2+ 2+ 2+ 2+   L 2+ 2+ 2+ 2+ 2+   Plantar response: downgoing bilaterally  Hoffman's response: absent bilaterally    Sensory:  Light touch: intact in BUE/BLE.  Vibration: Intact to bilateral upper and lower extremities   Proprioception: Intact to bilateral upper and lower extremities   Romberg: Intact     Coordination:  F-to-N: intact, without dysmetria  H-to-S: intact, without dysmetria  Rapid-alt movements: intact with bradykinesia as noted above   General gait:  normal base, stride length, and arm swing; no en block turn Unable to perform tandem walking without losing balance    Cortical:  Extinction on DSS (neglect):  absent    DIAGNOSTIC STUDIES:    MOCA 01/13/2024: 25/30    CT C-spine w/o   IMPRESSION:  1. Multilevel cervical spondylosis, with moderately severe to severe  neural foraminal stenosis at the left C3-C4 and left C6-C7 levels.    CT Head 2/o 02/07/2023:  IMPRESSION:  1. No acute intracranial hemorrhage or mass effect. Possible old left  subinsular infarctive change.    STOPBANG Questionnaire:  Snoring: 1  Tired: 1  Observed: 1  Blood Pressure: 1  BMI: 0  Age: 78  Neck: 0  Gender: 0    STOPBANG Total Score:5/7    Sleep Study 02/09/2014      Neuropsychiatric testing 10/24/2022   In sum, Ms. Myles cognitive evaluation was suggestive of mild executive dysfunction, visuospatial weakness, and severe depression. Ms. Podgurski currently meets criteria for mild cognitive  impairment. Etiology is likely related to cerebrovascular disease given several cerebrovascular and metabolic  risk factors. Risk factors, cognitive complaints, and clock drawing problems predate Ms. Gabrys's stroke. Nevertheless, her CVA likely worsened her cognition and improvement is expected to occur over the next 6-12 months. Severe depression is also likely a contributing factor and Ms. Bedoya may experience improvement in some of her cognitive abilities (though likely not her visuospatial difficulties) with aggressive treatment.      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    Patient had neuropsychiatric testing back in June 2024 that showed that patient has visual-spatial and mild executive dysfunction, (not amnestic) re-demonstrated on today's MOCA assessment. Patient also notes poor mood and is known to be suffering from severe depression without SI/HI, that can be contributing to cognitive dysfunction and complaints. Vision is questionable given patient stated had a fall where she mis-judged the distance to bed and car, may have depth perception impairment. Patient states she takes Benadryl  at night and advised to discuss with PCP/Allergist about alternative options given concerns of anti-cholinergic side effects. Obtained labs and placed neuropsychiatry referral for further workup.     Patient also reports sleep problems described by husband as witnessed stopped breathing and gasping as well as screaming, crying, fighting, that could represent REM sleep behavior disorder. Advised to take melatonin before sleep and referred to sleep medicine for sleep study with polysomnogram to r/o RBD and c/f OSA. No  clear history of parkinsonism features and neuro exam mostly within normal limits except for slight bradykinesia of left hand. Also unable to perform tandem walking and has had falls, physical therapy referral placed.     Patient also reports poor mood and has been known to have severe depression for years. Advised to discuss with PCP about further medical management as well as request PCP place referral for therapy program.     Patient unable to get MRIs due to stapedectomy with metal wire prosthesis of uncertain MRI compatibility.     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    Patient was seen and discussed with attending Dr. Shiela.    Sincerely,  Zachary Jasper, MD  Resident  Department of Neurology    Report electronically signed by:  Zachary Jasper, MD        ===============================  Neurology Attending Addendum:    The patient was seen and examined. I reviewed and agree with the assessment and plan we developed and outlined in the resident's note.        Evalene Shiela, DO   Health Sciences Assistant  Clinical Professor   Department of Neurology  [place] [facility]           [1]   Past Medical History:  Diagnosis Date    Angina pectoris (HCC) 09/23/2017    Anxiety     Asthma     Cirrhosis (HCC)     Constipation, chronic      Depression     Diabetes mellitus (HCC)     Fatty liver     Gastroparesis     History of rectal bleeding     Hypertension     Iron  deficiency anemia     Kidney disease 2015    cyst left kidney per pt    Liver fibrosis     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/20/2016    Otosclerosis     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    Psychiatric illness     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   [2]   Past Surgical History:  Procedure Laterality Date    BIOFEEDBACK PERI/URO/RECTAL  10/21/2017    session #1: N Score = 5.4    BIOFEEDBACK PERI/URO/RECTAL  11/04/2017    session #2    BIOFEEDBACK PERI/URO/RECTAL  11/18/2017    Session #3    BIOFEEDBACK PERI/URO/RECTAL  12/02/2017    Session #4    BIOFEEDBACK PERI/URO/RECTAL  12/30/2017    Session #5    BIOFEEDBACK PERI/URO/RECTAL  03/23/2018    Session #6    CAPSULE ENDOSCOPY  01/27/2018    CAPSULE ENDOSCOPY  01/29/2018         CAPSULE ENDOSCOPY  12/23/2021    wnl    COLONOSCOPY  01/04/2014    polyp--TA and erosion; hemorrhoids; tics; repeat x 3 yrs    COLONOSCOPY  12/16/2017    TA; repeat in 3 yrs    COLONOSCOPY  04/05/2021    polyps--path pending; hemorrhoids    EGD  04/2020    gastritis    EGD  04/08/2019    biopsy path--pending    EGD  04/05/2021    biopsies at tattoo--path pending    [facility] BIOFEEDBACK PERI/URO/RECTAL  05/02/2018         HC COLONOSCOPY,REMV LESN,SNARE  12/16/2017    MAMMOPLASTY, REDUCTION  1985    MANOMETRY  09/02/2017    work on constipation; kegels and biofeedback; proceed w/colonoscopy     PHACOEMULSIFICATION, CATARACT Right 02/12/2015    with IOL    PR ARTHROSCOPY KNEE DIAGNOSTIC W/WO SYNOVIAL BX SPX  1980s    Knee arthroscopy    PR ESOPHAGOGASTRODUODENOSCOPY TRANSORAL DIAGNOSTIC  02/12/2016    EGD--polyp--path pending; antral gastritis; no varices     PR ESOPHAGOGASTRODUODENOSCOPY TRANSORAL DIAGNOSTIC  06/11/2016    EGD--biopsy at site of carcinoid tumor path pending     PR ESOPHAGOGASTRODUODENOSCOPY TRANSORAL DIAGNOSTIC       EGD--folds in duodenum--path pending     PR ESOPHAGOGASTRODUODENOSCOPY TRANSORAL DIAGNOSTIC  04/2017    EGD; repeat EGD in 1 year     PR ESOPHAGOGASTRODUODENOSCOPY TRANSORAL DIAGNOSTIC  04/20/2018    gastritis; nodule--path pending    PR LIG/TRNSXJ FLP TUBE ABDL/VAG APPR UNI/BI      Tubal ligation    PR TOTAL ABDOMINAL HYSTERECT W/WO RMVL TUBE OVARY  1980s    partial; no BSO    PR TYMPANIC MEMB RPR W/WO PREPJ PERFOR PATCH      Tympanoplasty    REPAIR, BLEPHAROPTOSIS Bilateral 08/06/2015    Blepharoplasty bilateral upper lids   [3]   Current Outpatient  Medications on File Prior to Visit   Medication Sig Dispense Refill    albuterol  (Proair  HFA, Proventil  HFA, Ventolin  HFA) 90 mcg/actuation inhaler Take 2-4 puffs by inhalation every 4 to 6 hours if needed for wheezing. 8.5 g 2    Amlodipine  (NORVASC) 10 mg Tablet Take 1 tablet by mouth every day. 90 tablet 3    Atorvastatin  (LIPITOR) 20 mg Tablet Take 1 tablet by mouth every day at bedtime. 90 tablet 3    Blood Sugar Diagnostic (ONETOUCH ULTRA TEST) Strips Use to test blood glucose 2x/day 100 strip 11    CloNIDine  (CATAPRES ) 0.2 mg Tablet Take 1 tablet by mouth 2 times daily. Indications: change of life signs 180 tablet 3    Clopidogrel  (PLAVIX ) 75 mg Tablet Take 1 tablet by mouth every morning. 90 tablet 3    Diclofenac  (VOLTAREN ARTHRITIS PAIN) 1 % Gel Apply to the affected area 4 times daily. 100 g 1    insulin  glargine (Lantus  Solostar U-100 Insulin ) 100 unit/mL (3 mL) pen Inject 10 Units subcutaneously every day. 15 mL 12    Losartan  (COZAAR ) 100 mg tablet Take 1 tablet by mouth every day. 90 tablet 3    Metformin  (GLUCOPHAGE -XR) 500 mg XR tablet Take 4 tablets by mouth every evening with a meal. 360 tablet 3    NYSTATIN  100,000 unit/gram Powder Apply a thin layer of powder to affected area 4 times a day as needed 60 g 0    Ondansetron  (ZOFRAN -ODT) 8 mg disintegrating tablet Take 1 tablet by mouth every 8 hours if needed. 90 tablet 2    PEG-Electrolyte Soln  (COLYTE, GOLYTELY) 236-22.74-6.74 -5.86 gram Liquid Take as directed by [facility]. Do not follow instructions on the box. Disp-4000 mL, R-0, Pharmacy okay to dispense Gavilyte, C, G, or N. Also OK to dispense Nulytely, generic Moviprep, Colyte, or Golytely. 4000 mL 0    pen needle, diabetic (BD Ultra-Fine III Short Pen Needle) 31 gauge x 5/16 Use for injecting insulin  1 times per day 50 each 11     No current facility-administered medications on file prior to visit.   [4]   Allergies  Allergen Reactions    Sulfa (Sulfonamide Antibiotics) Rash    Contrast Dye [Radiopaque Agent] Hives     Reports has recently tolerated well    Hydrochlorothiazide Other-Reaction in Comments     Patient reports    Januvia [Sitagliptin] Other-Reaction in Comments     Patient reports    Keflex  [Cephalexin ] Abdominal Pain    Lyrica  [Pregabalin ] Other-Reaction in Comments     Patient reports    Omnipaque  [Iohexol ] Other-Reaction in Comments     Patient reports    Prozac [Fluoxetine Hcl] Other-Reaction in Comments     Patient reports    Topiramate Other-Reaction in Comments     Hairfall

## 2024-01-14 ENCOUNTER — Encounter: Payer: Self-pay | Admitting: Student in an Organized Health Care Education/Training Program

## 2024-01-14 ENCOUNTER — Other Ambulatory Visit: Payer: Self-pay | Admitting: Student in an Organized Health Care Education/Training Program

## 2024-01-14 ENCOUNTER — Telehealth: Payer: Self-pay

## 2024-01-14 DIAGNOSIS — G3184 Mild cognitive impairment, so stated: Secondary | ICD-10-CM

## 2024-01-14 DIAGNOSIS — F515 Nightmare disorder: Secondary | ICD-10-CM

## 2024-01-14 DIAGNOSIS — F418 Other specified anxiety disorders: Secondary | ICD-10-CM

## 2024-01-14 DIAGNOSIS — R2689 Other abnormalities of gait and mobility: Secondary | ICD-10-CM

## 2024-01-14 NOTE — Telephone Encounter (Signed)
 [phone] referral information provided

## 2024-01-14 NOTE — Addendum Note (Signed)
 Addended by: BRIEN SPEAKER on: 01/14/2024 05:00 PM     Modules accepted: Orders

## 2024-01-16 ENCOUNTER — Other Ambulatory Visit: Payer: Self-pay

## 2024-01-16 DIAGNOSIS — K31819 Angiodysplasia of stomach and duodenum without bleeding: Secondary | ICD-10-CM

## 2024-01-18 ENCOUNTER — Ambulatory Visit: Attending: Family Medicine

## 2024-01-18 DIAGNOSIS — F515 Nightmare disorder: Secondary | ICD-10-CM | POA: Insufficient documentation

## 2024-01-18 DIAGNOSIS — G3184 Mild cognitive impairment, so stated: Secondary | ICD-10-CM | POA: Insufficient documentation

## 2024-01-18 DIAGNOSIS — E119 Type 2 diabetes mellitus without complications: Secondary | ICD-10-CM | POA: Insufficient documentation

## 2024-01-18 LAB — COMPREHENSIVE METABOLIC PANEL
Alanine Transferase (ALT): 61 U/L — ABNORMAL HIGH (ref <=33)
Albumin: 4.2 g/dL (ref 4.0–4.9)
Alkaline Phosphatase (ALP): 158 U/L — ABNORMAL HIGH (ref 35–129)
Anion Gap: 13 mmol/L (ref 7–15)
Aspartate Transaminase (AST): 51 U/L — ABNORMAL HIGH (ref <=41)
Bilirubin Total: 0.7 mg/dL (ref <=1.2)
Calcium: 9.4 mg/dL (ref 8.6–10.0)
Carbon Dioxide Total: 24 mmol/L (ref 22–29)
Chloride: 104 mmol/L (ref 98–107)
Creatinine Serum: 1.27 mg/dL — ABNORMAL HIGH (ref 0.51–1.17)
E-GFR Creatinine (Female): 44 mL/min/1.73m*2
Glucose: 190 mg/dL — ABNORMAL HIGH (ref 74–109)
Potassium: 4.5 mmol/L (ref 3.4–5.1)
Protein: 8 g/dL (ref 6.6–8.7)
Sodium: 141 mmol/L (ref 136–145)
Urea Nitrogen, Blood (BUN): 20 mg/dL (ref 6–20)

## 2024-01-18 LAB — LIPID PANEL
Cholesterol: 142 mg/dL (ref <200)
HDL Cholesterol: 59 mg/dL (ref >40)
Non-HDL Cholesterol: 83 mg/dL (ref <=150)
Total Cholesterol: HDL Ratio: 2.4 (ref <4.0)
Triglyceride Level: 162 mg/dL — ABNORMAL HIGH (ref <150)

## 2024-01-18 LAB — FOLATE: Folate: 9.1 ng/mL (ref >=7.0)

## 2024-01-18 LAB — VITAMIN B12: Vitamin B12: 204 pg/mL — ABNORMAL LOW (ref 213–816)

## 2024-01-18 LAB — TSH WITH FREE T4 REFLEX: Thyroid Stimulating Hormone: 1.31 u[IU]/mL (ref 0.27–4.20)

## 2024-01-19 ENCOUNTER — Ambulatory Visit: Payer: Self-pay

## 2024-01-19 ENCOUNTER — Ambulatory Visit: Payer: Medicare Other | Admitting: "Endocrinology

## 2024-01-19 LAB — METHYLMALONIC ACID LEVEL: Methylmalonic Acid Level: 1.55 umol/L — ABNORMAL HIGH (ref 0.00–0.40)

## 2024-01-19 LAB — HEMOGLOBIN A1C
Hgb A1C,Glucose Est Avg: 169 mg/dL
Hgb A1C: 7.5 % — ABNORMAL HIGH (ref 3.9–5.6)

## 2024-01-19 NOTE — Progress Notes (Signed)
 Hi Dr Payton, will you be able to follow up with Inocente regarding her low B12, she should start some oral supplementation now, and also consider touching base with her PCP about a B12 infusion.     Evalene Pack, DO   Health Sciences Assistant Clinical Professor  Department of Neurology  [place] [facility]

## 2024-01-20 ENCOUNTER — Encounter: Payer: Self-pay | Admitting: "Endocrinology

## 2024-01-20 ENCOUNTER — Ambulatory Visit: Attending: "Endocrinology | Admitting: "Endocrinology

## 2024-01-20 DIAGNOSIS — E119 Type 2 diabetes mellitus without complications: Secondary | ICD-10-CM | POA: Insufficient documentation

## 2024-01-20 DIAGNOSIS — R809 Proteinuria, unspecified: Secondary | ICD-10-CM | POA: Insufficient documentation

## 2024-01-20 DIAGNOSIS — Z794 Long term (current) use of insulin: Secondary | ICD-10-CM | POA: Insufficient documentation

## 2024-01-20 DIAGNOSIS — E1129 Type 2 diabetes mellitus with other diabetic kidney complication: Secondary | ICD-10-CM | POA: Insufficient documentation

## 2024-01-20 MED ORDER — METFORMIN ER 500 MG TABLET,EXTENDED RELEASE 24 HR: 2000.0000 mg | EXTENDED_RELEASE_TABLET | Freq: Every day | ORAL | 3 refills | Status: DC

## 2024-01-20 MED ORDER — LANTUS SOLOSTAR U-100 INSULIN 100 UNIT/ML (3 ML) SUBCUTANEOUS PEN: 15.0000 [IU] | PEN_INJECTOR | Freq: Every day | SUBCUTANEOUS | 3 refills | Status: DC

## 2024-01-20 MED ORDER — PEG 3350-ELECTROLYTES 236 GRAM-22.74 GRAM-6.74 GRAM-5.86 GRAM SOLUTION
ORAL | 0 refills | Status: DC
Start: 2024-01-20 — End: 2024-02-05

## 2024-01-20 MED ORDER — LANTUS SOLOSTAR U-100 INSULIN 100 UNIT/ML (3 ML) SUBCUTANEOUS PEN: 15.0000 [IU] | PEN_INJECTOR | Freq: Every day | SUBCUTANEOUS | 12 refills | Status: DC

## 2024-01-20 NOTE — Nursing Note (Signed)
 Patient verified with two identifiers.    Vitals obtained for visit, including BP, Pulse/Ox, Temp, Wt/Ht.  Verified Allergies, Tobacco Use Status, and Preferred Pharmacy.  Pain assessed    01/20/2024  Venetia Rash, MA

## 2024-01-20 NOTE — Progress Notes (Signed)
 Endocrinology Follow up clinic note:    Chief Complaint:  I'm here to follow up on my diabetes.    HPI: Paula Daugherty is a 78yr old female who has a diagnosis of type 2 diabetes mellitus who presents to Endocrinology for follow up. Her history is as follows:  She was initially diagnosed with diabetes around 2000 on routine labs. She has a strong family history of diabetes so she wasn't surprised at this. She was started on Metformin  around 2005 and then she was started on insulin  in 2014. She was up to 64 units of Lantus  at night. However, after making changes to her diet and lifestyle, she was taken off insulin  in 2015. Basal insulin  was added back to her regimen in 2018 and she was later able to discontinue this.      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     Interim History: She returns today for follow up. Her last visit with Endocrinology was on 10/20/2023. She states that overall, since this visit, she has been doing ok. She has had some changes in her memory and mood for which she has been referred to Neurology and is undergoing further testing. She reports that her glucose has been running higher in the morning.     Current Regimen:               Metformin  1000 mg QHS   Lantus  10 units QHS (just started on 01/11/2024)  Hypoglycemia: None recently  Hyperglycemia: Yes, glucose is running higher in the mornings  Meter brought today: Yes  Checks blood glucose: 2x/day  Home blood glucose numbers per pt's log:   Blood Glucose:                                     Pre-breakfast: 234 to 263                                         Pre-dinner: 162 to 285                          Last eye exam: Has f/up scheduled on 04/11/2024        ROS:  All other systems were reviewed and are negatative except for pertinent positive and negative responses as documented in HPI.     Medications:  Medication reconciliation was performed today.   Medications Ordered Prior to Encounter[1]    I did  review patient's past medical and family/social history, no changes noted.   PMH:  Past medical history was reviewed from problem list.   Problem List[2]    VITAL SIGNS:  BP 113/57 (SITE: left arm, Orthostatic Position: sitting, Cuff Size: large)   Pulse 55   Temp 36 C (96.8 F) (Temporal)   Ht 1.575 m (5' 2)   Wt 75.5 kg (166 lb 7.2 oz)   LMP 05/06/1983   SpO2 100%   BMI 30.44 kg/m   Body mass index is 30.44 kg/m.    PHYSICAL EXAM:  General Appearance: healthy, alert, no distress, pleasant affect, cooperative.  Eyes:  conjunctivae and corneas clear. EOM's intact. sclerae normal.  Neck:  Neck supple.   Extremities:  no cyanosis, clubbing, or edema.  Skin:  Skin color, texture,  turgor normal. No rashes or lesions.  Neuro: No tremor appreciated.   Mental Status: Appearance/Cooperation: in no apparent distress and well developed and well nourished  Eye Contact: normal  Speech: normal volume, rate, and pitch    LAB TESTS/STUDIES:   I personally reviewed the following laboratory and/or imaging studies.   01/11/24 10:23 01/18/24 10:06   Sodium 138 141   Potassium 5.0 4.5   Chloride 100 104   Carbon Dioxide Total 23 24   Anion Gap 15 13   Urea Nitrogen, Blood (BUN) 24 (H) 20   Creatinine Blood 1.37 (H) 1.27 (H)   Glucose 160 (H) 190 (H)   Calcium  9.8 9.4   Protein 8.9 (H) 8.0   Albumin 4.7 4.2   Alkaline Phosphatase (ALP) 152 (H) 158 (H)   Aspartate Transaminase (AST) 40 51 (H)   Bilirubin Total 1.0 0.7   Alanine Transferase (ALT) 43 (H) 61 (H)   E-GFR Creatinine (Female) 40 44   Cholesterol  142   Triglyceride  162 (H)   LDL Cholesterol Calculation  NOTE:   HDL Cholesterol  59   Non-HDL Cholesterol  83   Total Cholesterol:HDL Ratio  2.4   Ferritin 14    Folate  9.1   Thyroid  Stimulating Hormone  1.31   Hgb A1C  7.5 (H)   Hgb A1C,Glucose Est Avg  169   Methylmalonic Acid Level  1.55 (H)   Vitamin B12  204 (L)      01/11/24 10:23   White Blood Cell Count 8.1   Red Blood Cell Count 4.49   Hemoglobin 11.5 (L)    Hematocrit 36.3   MCV 80.8   MCH 25.6 (L)   MCHC 31.7 (L)   RDW 16.5 (H)   Platelet Count 252      06/17/23 10:33   Creatinine Spot Urine 155.2  155.2   Microalbumin Urine 34.1   Microalbumin/Creatinine Ratio 220     Thyroid  ultrasound (09/25/2021):  FINDINGS:  Parenchymal echotexture: heterogeneous.  Right lobe: 3.9 x 1.4 x 1.9 cm (5.1 cc).  Nodules:  Subcentimeter nodule(s) present but none that meet criteria for FNA or  follow up.  Left lobe: 4.1 x 1.7 x 2.1 cm (6.9 cc).  Nodules:  1. Mid lobe, 0.9 cm, solid or almost completely solid (2 pts),  hypoechoic (2 pts), wider-than-tall (0 pt), with smooth margins (0 pt).  Macrocalcifications: absent (0 pt). Peripheral rim calcification: absent (0  pt). Punctate echogenic foci: none (0 pt). TR4 - Moderately suspicious.  Subcentimeter nodule(s) present but none that meet criteria for FNA or  follow up.  Isthmus: 0.3 cm.  Nodules:  none.  No abnormal cervical lymph nodes.      IMPRESSION:  1. No nodules meet criteria for FNA or follow-up    Impression: This is a 78yr old female with Diabetes Mellitus Type II who presents to Endocrinology for follow up. Overall, her glycemic control is above goal as evidenced by her glucose readings of mid-200s and most recent A1c of 7.5%. Based on this, I have recommended an increase in her basal insulin  dose.     She also has a history of an adrenal nodule. Hormonal evaluation was last done and was normal in 04/2021.      DIABETES HISTORY  (-) h/o retinopathy.      (-) h/o microalbuminuria/nephropathy.  (-) h/o neuropathy.  (-) h/o Autonomic dysfunction  (-) h/o Gastroparesis  (-) h/o CAD  (-) h/o PVD     Last  eye exam: f/up scheduled 04/2024  Last urine microalbumin: 06/2023, 220  Pneumovax: 12/2014, Prevnar 2018  Influenza vaccine: fall 2024  (-) ASA  (+) Statin  (+) ACE/ARB          Recommendations:  (E11.29) Type 2 diabetes mellitus with other diabetic kidney complication  (primary encounter diagnosis)  - Increase Lantus  to 15 units  QHS  - Continue metformin  to 500 mg ER tablets - take 1500 mg (3 tablets) every night  - Encouraged patient to continue close blood glucose monitoring, will notify me on glucose trends at home  - Last LDL at goal, continue statin, repeat due 01/2025  - Last microalbumin:creatinine ratio elevated, continue cozaar , repeat due 06/2024  - Up to date on screening eye exam, f/up as scheduled in 04/2024  - Blood pressure at goal, continue ARB  - Repeat A1c prior to follow up appointment  - Discussed daily foot care     Approximately 20 minutes were spent with patient, greater than 50% of which was spent counseling the patient on diabetes management and on coordination of care.    Follow up in 3 months    If you have any questions, please do not hesitate to contact me at [phone].  Thank you for allowing me to participate in the care of this patient.    EDUCATION:  I educated/instructed the patient or caregiver regarding all aspects of the above stated plan of care.  The patient or caregiver indicated understanding.      [place] interpreter was not used.    Report electronically signed by:  Lonell Lamer, M.D.  Clinical Associate Professor  Department of Endocrinology              [1]   Current Outpatient Medications on File Prior to Visit   Medication Sig Dispense Refill    acetaminophen  (TylenoL ) 325 mg tablet Take 1 tablet by mouth if needed for pain (joint pain).      albuterol  (Proair  HFA, Proventil  HFA, Ventolin  HFA) 90 mcg/actuation inhaler Take 2-4 puffs by inhalation every 4 to 6 hours if needed for wheezing. 8.5 g 2    Amlodipine  (NORVASC) 10 mg Tablet Take 1 tablet by mouth every day. 90 tablet 3    Atorvastatin  (LIPITOR) 20 mg Tablet Take 1 tablet by mouth every day at bedtime. 90 tablet 3    Blood Sugar Diagnostic (ONETOUCH ULTRA TEST) Strips Use to test blood glucose 2x/day 100 strip 11    CloNIDine  (CATAPRES ) 0.2 mg Tablet Take 1 tablet by mouth 2 times daily. Indications: change of life signs 180 tablet 3     Clopidogrel  (PLAVIX ) 75 mg Tablet Take 1 tablet by mouth every morning. 90 tablet 3    Diclofenac  (VOLTAREN ARTHRITIS PAIN) 1 % Gel Apply to the affected area 4 times daily. 100 g 1    diphenhydrAMINE  (BenadryL ) 25 mg capsule Take 2 capsules by mouth every day at bedtime if needed (Itching).      Losartan  (COZAAR ) 100 mg tablet Take 1 tablet by mouth every day. 90 tablet 3    NYSTATIN  100,000 unit/gram Powder Apply a thin layer of powder to affected area 4 times a day as needed 60 g 0    Ondansetron  (ZOFRAN -ODT) 8 mg disintegrating tablet Take 1 tablet by mouth every 8 hours if needed. 90 tablet 2    pen needle, diabetic (BD Ultra-Fine III Short Pen Needle) 31 gauge x 5/16 Use for injecting insulin  1 times per day 50 each 11  No current facility-administered medications on file prior to visit.   [2]   Patient Active Problem List  Diagnosis    Diabetes (HCC)    Depression with anxiety    Stress at home    Hypertension    GERD (gastroesophageal reflux disease)    Hyperlipidemia with target LDL less than 70    Abnormal LFTs    Renal cyst ( 6.4 cm cyst left kidney)    Cough    Disorder of liver    Macroalbuminuric diabetic nephropathy (HCC)    History of actinic keratoses    Cataract    Ptosis of eyelid    Liver fibrosis    Adrenal nodule (HCC)    Medication Therapy Auth    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HCC)    Mixed conductive and sensorineural hearing loss of both ears    Neoplasm of uncertain behavior of skin    Abdominal pain, generalized    Nausea without vomiting    Asymptomatic microscopic hematuria    Subcutaneous nodule    Angina pectoris (HCC)    Mild episode of recurrent major depressive disorder (HCC)    NASH (nonalcoholic steatohepatitis)    Iron  deficiency anemia due to chronic blood loss    Kidney disorder

## 2024-01-20 NOTE — Patient Instructions (Addendum)
 Please increase Lantus  insulin  to 15 units every night  Notify Dr. Arman if you have any low blood sugar (80) or persistently elevated blood sugar (>150) prior to follow up  Return to clinic in 3 months with A1c first (or can do point of care A1c at the visit)

## 2024-01-21 LAB — VITAMIN B1(THIAMINE) WHOLE BL: Vitamin B1(Thiamine) Whole Bl: 89 nmol/L (ref 70–180)

## 2024-01-22 ENCOUNTER — Encounter: Payer: Self-pay | Admitting: Student in an Organized Health Care Education/Training Program

## 2024-01-22 NOTE — Telephone Encounter (Signed)
 Forwarding to Provider to review and respond to patient.  Ok for sooner appointment with you?

## 2024-01-27 ENCOUNTER — Ambulatory Visit

## 2024-01-27 ENCOUNTER — Ambulatory Visit: Attending: Family Medicine

## 2024-01-27 DIAGNOSIS — F332 Major depressive disorder, recurrent severe without psychotic features: Secondary | ICD-10-CM

## 2024-01-27 DIAGNOSIS — K7460 Unspecified cirrhosis of liver: Secondary | ICD-10-CM | POA: Insufficient documentation

## 2024-01-27 LAB — COMPREHENSIVE METABOLIC PANEL
Alanine Transferase (ALT): 33 U/L (ref <=33)
Albumin: 4.2 g/dL (ref 4.0–4.9)
Alkaline Phosphatase (ALP): 122 U/L (ref 35–129)
Anion Gap: 15 mmol/L (ref 7–15)
Aspartate Transaminase (AST): 36 U/L (ref <=41)
Bilirubin Total: 0.8 mg/dL (ref <=1.2)
Calcium: 9.8 mg/dL (ref 8.6–10.0)
Carbon Dioxide Total: 23 mmol/L (ref 22–29)
Chloride: 103 mmol/L (ref 98–107)
Creatinine Serum: 1.34 mg/dL — ABNORMAL HIGH (ref 0.51–1.17)
E-GFR Creatinine (Female): 41 mL/min/1.73m*2
Glucose: 153 mg/dL — ABNORMAL HIGH (ref 74–109)
Potassium: 4.9 mmol/L (ref 3.4–5.1)
Protein: 7.9 g/dL (ref 6.6–8.7)
Sodium: 141 mmol/L (ref 136–145)
Urea Nitrogen, Blood (BUN): 23 mg/dL — ABNORMAL HIGH (ref 6–20)

## 2024-01-27 NOTE — Patient Instructions (Addendum)
 01/27/2024    Dear Paula Daugherty,    To start Collaborative care I want to make sure that you are informed and give your consent for treatment.  My hope is that you find counseling a safe place to express your thoughts and have the support to explore solutions through your current life stressors.      If you ever have questions, concerns or a complaint about your care, you have the right to contact the following supports.  The office manager for the [facility] clinic is [employer].  It is important to know that [name] wants to ensure you are getting the care you need.  Patients or their authorized representatives may call and speak with Patient Relations Monday-Friday, 8 a.m. to 5 p.m.      [facility] Patient Relations Department  [address]  [place], [place] [address]    Email  [email or web address]  Phone [phone] or [phone]    Finally, as a Licensed Visual merchandiser (LCSW), my accreditation is maintained with the Board of [employer] ([employer]).  The Board of [employer] receives and responds to complaints regarding services provided within the scope of practice, Clinical social workers.  You may contact the board online [email or web address] by calling [phone] or at their Mailing Address    [address] [place], [place], [place] [address]     Please feel free to ask any questions, I'm here to help.     Chyrl Ply, LCSW  License No [id number]  Valid until: 07/02/2024  Behavioral Health Clinician  Collaborative Care Program  [facility]
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
## 2024-01-28 ENCOUNTER — Ambulatory Visit: Payer: Self-pay | Admitting: GASTROENTEROLOGY

## 2024-01-28 ENCOUNTER — Encounter: Payer: Self-pay | Admitting: Student in an Organized Health Care Education/Training Program

## 2024-01-28 NOTE — Telephone Encounter (Signed)
 Forwarding to Provider to review and respond to patient.  Per results note dated 01/11/24, pt was to repeat metabolic panel. See CMP results dated 9/15 and 9/24

## 2024-01-28 NOTE — Telephone Encounter (Signed)
 Requested more information    Per chart review, CMP was completed on 01/27/24

## 2024-01-29 ENCOUNTER — Encounter

## 2024-01-29 NOTE — Telephone Encounter (Signed)
 Patient is on high priority waitlist.    Randine Mais [facility]  [facility]  Appointment line [phone]  Clinic Fax # [phone]

## 2024-02-02 ENCOUNTER — Ambulatory Visit: Admitting: "Endocrinology

## 2024-02-02 ENCOUNTER — Encounter: Payer: Self-pay | Admitting: PULMONARY DISEASE

## 2024-02-02 ENCOUNTER — Ambulatory Visit: Attending: Student in an Organized Health Care Education/Training Program | Admitting: PULMONARY DISEASE

## 2024-02-02 VITALS — BP 121/69 | HR 69 | Wt 165.1 lb

## 2024-02-02 DIAGNOSIS — R0602 Shortness of breath: Secondary | ICD-10-CM

## 2024-02-02 DIAGNOSIS — R053 Chronic cough: Secondary | ICD-10-CM

## 2024-02-02 NOTE — Progress Notes (Signed)
 Chief Complaint: Please see HPI.    History of Present Illness:    The patient is a very pleasant 74yr female with significant PMH of chronic cough since early this year, shortness for breath and wheezing, who is being referred to the pulmonary clinic by their primary care physician for further evaluation and management of their pulmonary issues. The patient's Covid-19 questionnaire/assessment is negative.  She is accompanied by her husband who corroborates with a history.     The patient states that she was in her usual health until late spring or early summer this year when she developed an insidious onset cough.  She does not recollect precipitating events such as upper respiratory illness or pneumonia.  Since then, her cough has been persistent.  She describes her cough to be present throughout the day, occasionally at nighttime waking her up in the middle of the night, mostly nonproductive and with no known specific identifiable triggers.  She does have occasional partial relief with sips of water  but does feel symptomatic improvement when she uses her albuterol  inhaler.  She has no associated phlegm production, fevers, chills, night sweats, unexplained weight loss, palpable lymphadenopathy, orthopnea or PND.  She denies dysphagia, choking or aspiration symptoms.  Her weight has remained stable in the past many months.  She has no other aggravating or relieving factors to her symptoms.    The patient denies any fevers, chills, rigors, night sweats, unexplained weight loss, lymphadenopathy, chest pain, nausea, diaphoresis, syncope/presyncopal symptoms.     The patient denies any previous history of COPD, asthma, chronic bronchitis, bronchiectasis, frequent pneumonias or other chronic pulmonary problems.    Review of systems:    A complete review of systems was performed and was negative except as in HPI.    Past Medical History[1]    Past Surgical History[2]    Allergies:  Sulfa (sulfonamide antibiotics),  Contrast dye [radiopaque agent], Hydrochlorothiazide, Januvia [sitagliptin], Keflex  [cephalexin ], Lyrica  [pregabalin ], Omnipaque  [iohexol ], Prozac [fluoxetine hcl], and Topiramate    Medications Ordered Prior to Encounter[3]    Occupational and exposure history:    Occupation/employment:  Retired  Recent travel outside the [place] : no  Exposure to TB: no  Positive PPD: no  Pets: no  Exposure to asbestos, silica, other inhalants: no  History of DVT or PE: no  Tobacco use:  Very remote exposure to tobacco smoke    Family History:    No significant pulmonary medical problems in immediate family members.    Physical Examination:    GENERAL: Alert and oriented to time, place and person without distress, able to speak clearly and in full sentences.       HEENT: PERLA, EOMI, moist mucous membranes,  no sinus tenderness on palpation,     NECK: Symmetric without notable rashes or rigidity.     LUNGS: Normal respiratory rate and effort. No chest deformities. Good air entry bilaterally.  No adventitious sounds.     CARDIAC: S1 S2 heard    GI: soft abdomen, no tenderness to palpation, no peritoneal signs    GU: No CVA tenderness, rest of the exam deferred by the patient    Extremities: Moves arms and hands freely, no visible cyanosis, peripheral pulses palpable,  no clubbing,  no pitting edema    SKIN: No visible rashes.     Neuro: Alert and oriented x 3, linear thoughts with sound judgement and insight, normal mood and affect. Strength 5/5 in all extremities    Data reviewed:    I have  personally and independently reviewed and interpreted the tests noted below on 02/02/2024. I have updated the patient with the results of these tests.    Labs:    Lab Visit on 01/27/2024   Component Date Value    Sodium 01/27/2024 141     Potassium 01/27/2024 4.9     Chloride 01/27/2024 103     Carbon Dioxide Total 01/27/2024 23     Anion Gap 01/27/2024 15     Urea Nitrogen, Blood (BU* 01/27/2024 23 (H)     Creatinine Serum 01/27/2024 1.34 (H)      Glucose 01/27/2024 153 (H)     Calcium  01/27/2024 9.8     Protein 01/27/2024 7.9     Albumin 01/27/2024 4.2     Alkaline Phosphatase (AL* 01/27/2024 122     Aspartate Transaminase (* 01/27/2024 36     Bilirubin Total 01/27/2024 0.8     Alanine Transferase (ALT) 01/27/2024 33     E-GFR Creatinine (Female) 01/27/2024 41    Lab Visit on 01/18/2024   Component Date Value    Cholesterol 01/18/2024 142     HDL Cholesterol 01/18/2024 59     LDL Cholesterol Calculat* 90/84/7974      Total Cholesterol: HDL R* 01/18/2024 2.4     Triglyceride Level 01/18/2024 162 (H)     Non-HDL Cholesterol 01/18/2024 83     Sodium 01/18/2024 141     Potassium 01/18/2024 4.5     Chloride 01/18/2024 104     Carbon Dioxide Total 01/18/2024 24     Anion Gap 01/18/2024 13     Urea Nitrogen, Blood (BU* 01/18/2024 20     Creatinine Serum 01/18/2024 1.27 (H)     Glucose 01/18/2024 190 (H)     Calcium  01/18/2024 9.4     Protein 01/18/2024 8.0     Albumin 01/18/2024 4.2     Alkaline Phosphatase (AL* 01/18/2024 158 (H)     Aspartate Transaminase (* 01/18/2024 51 (H)     Bilirubin Total 01/18/2024 0.7     Alanine Transferase (ALT) 01/18/2024 61 (H)     E-GFR Creatinine (Female) 01/18/2024 44     Thyroid  Stimulating Horm* 01/18/2024 1.31     Hgb A1C 01/18/2024 7.5 (H)     Hgb A1C,Glucose Est Avg 01/18/2024 169     Vitamin B12 01/18/2024 204 (L)     Methylmalonic Acid Level 01/18/2024 1.55 (H)     Vitamin B1(Thiamine ) Who* 01/18/2024 89     Folate 01/18/2024 9.1    Lab Visit on 01/11/2024   Component Date Value    Ferritin 01/11/2024 14     White Blood Cell Count 01/11/2024 8.1     Red Blood Cell Count 01/11/2024 4.49     Hemoglobin 01/11/2024 11.5 (L)     Hematocrit 01/11/2024 36.3     MCV 01/11/2024 80.8     MCH 01/11/2024 25.6 (L)     MCHC 01/11/2024 31.7 (L)     RDW 01/11/2024 16.5 (H)     MPV 01/11/2024 8.1     Platelet Count 01/11/2024 252     Neutrophils % Auto 01/11/2024 70.6     Lymphocytes % Auto 01/11/2024 18.6     Monocytes % Auto 01/11/2024  8.7     Eosinophils % Auto 01/11/2024 1.5     Basophils % Auto 01/11/2024 0.6     Neutrophils Abs Auto 01/11/2024 5.7     Lymphocytes Abs Auto 01/11/2024 1.5     Monocytes Abs Auto 01/11/2024 0.7  Eosinophils Abs Auto 01/11/2024 0.1     Basophils Abs Auto 01/11/2024 0.0     Sodium 01/11/2024 138     Potassium 01/11/2024 5.0     Chloride 01/11/2024 100     Carbon Dioxide Total 01/11/2024 23     Anion Gap 01/11/2024 15     Urea Nitrogen, Blood (BU* 01/11/2024 24 (H)     Creatinine Serum 01/11/2024 1.37 (H)     Glucose 01/11/2024 160 (H)     Calcium  01/11/2024 9.8     Protein 01/11/2024 8.9 (H)     Albumin 01/11/2024 4.7     Alkaline Phosphatase (AL* 01/11/2024 152 (H)     Aspartate Transaminase (* 01/11/2024 40     Bilirubin Total 01/11/2024 1.0     Alanine Transferase (ALT) 01/11/2024 43 (H)     E-GFR Creatinine (Female) 01/11/2024 40        Imaging:    CXR was performed on 01/11/2024. I have reviewed the images and the report on 02/02/2024.      FINDINGS:  Heart size normal, aorta tortuous and calcified. Lungs are clear. No  evidence of pleural, pneumothorax or pneumoperitoneum.     Unremarkable upper abdomen     IMPRESSION: 1. No acute cardiopulmonary findings     Final Report Electronically Signed By: Prentice Bring on 01/11/2024 11:37 AM    Procedures:    PFT were not performed.    Echocardiogram was performed on 11/28/2022. I have reviewed the report on 02/02/2024.    SUMMARY:    1. Left ventricular ejection fraction, by Simpson's Biplane, is normal at 64 %.    2. There is no evidence of pericardial effusion.    3. Left ventricular diastolic function is indeterminate.    4. Mild concentric left ventricular hypertrophy.    5. Normal right ventricular size, wall thickness, and systolic function.    6. The left atrium is normal in size and structure.    7. The right atrium is normal in size and structure.    8. The tricuspid regurgitation jet is inadequate to estimate RVSP.          ICD-10-CM    1. Chronic cough  R05.3  Adult Pulmonology Clinic Referral      2. Shortness of breath  R06.02 Adult Pulmonology Clinic Referral          Assessment and plan:    (R05.3) Chronic cough  (primary encounter diagnosis)    The patient's chronic cough is likely due to multifactorial etiologies including upper airway causes, GERD, cough variant asthma. There is no evidence of lower respiratory tract bacterial infection that requires antibiotic therapy.     As her symptoms are consistent with chronic sinus drainage related upper airway cough with associated inflammatory chronic sinusitis.     We have advised the patient to:     Start Fluticasone  50 g 1 spray each nostril twice a day. We have discussed the technique to use this medication. I have also discussed the potential side effects and the patient will discontinue the medication if there are such side effects.  Start Loratadine 10 mg one tablet by mouth daily at bedtime.  Identify and avoid known triggers.    We have advised the patient to call the pulmonary clinic in 6 weeks to report.     (R06.02) Shortness of breath    The patient also has a occasional shortness for breath limiting her activities.  She is scheduled to undergo complete PFTs on 03/04/2024.  We will follow up once we have the results of the study and consider further workup at that time.    The patient understands and is agreeable with the above plan of management. The patient has been advised to follow up in 6 weeks, or sooner if needed, for a clinic/virtual visit. The patient is encouraged to call the pulmonary clinic if there are any questions or problems.      I have reconciled the patient's pulmonary medications today.  I will defer the management of the patient's comorbidities to the PCP and other consultants.  The patient will discuss the vaccination status with the PCP.  Thank you for allowing me to participate in the care of this patient. Please do not hesitate to contact me if any questions or concerns arise.      Lynelle Spanish MD    Associate Physician  IM: Pulmonary   [facility] [place]    This note was dictated using voice recognition software and may contain errors in transcription that have inadvertently escaped proof reading. Please contact me for any questions or uncertainty in the information provided.          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   [2]   Past Surgical History:  Procedure Laterality Date    BIOFEEDBACK PERI/URO/RECTAL  10/21/2017    session #1: N Score = 5.4    BIOFEEDBACK PERI/URO/RECTAL  11/04/2017    session #2    BIOFEEDBACK PERI/URO/RECTAL  11/18/2017    Session #3    BIOFEEDBACK PERI/URO/RECTAL  12/02/2017    Session #4    BIOFEEDBACK PERI/URO/RECTAL  12/30/2017    Session #5    BIOFEEDBACK PERI/URO/RECTAL  03/23/2018    Session #6    CAPSULE ENDOSCOPY  01/27/2018    CAPSULE ENDOSCOPY  01/29/2018         CAPSULE ENDOSCOPY  12/23/2021    wnl    COLONOSCOPY  01/04/2014    polyp--TA and erosion; hemorrhoids; tics; repeat x 3 yrs    COLONOSCOPY  12/16/2017    TA; repeat in 3 yrs    COLONOSCOPY  04/05/2021    polyps--path pending; hemorrhoids    EGD  04/2020    gastritis    EGD  04/08/2019    biopsy path--pending    EGD  04/05/2021    biopsies at tattoo--path pending    [facility] BIOFEEDBACK PERI/URO/RECTAL  05/02/2018         HC COLONOSCOPY,REMV LESN,SNARE  12/16/2017    MAMMOPLASTY, REDUCTION  1985    MANOMETRY  09/02/2017    work on constipation; kegels and biofeedback; proceed  w/colonoscopy     PHACOEMULSIFICATION, CATARACT Right 02/12/2015    with IOL    PR ARTHROSCOPY  KNEE DIAGNOSTIC W/WO SYNOVIAL BX SPX  1980s    Knee arthroscopy    PR ESOPHAGOGASTRODUODENOSCOPY TRANSORAL DIAGNOSTIC  02/12/2016    EGD--polyp--path pending; antral gastritis; no varices     PR ESOPHAGOGASTRODUODENOSCOPY TRANSORAL DIAGNOSTIC  06/11/2016    EGD--biopsy at site of carcinoid tumor path pending     PR ESOPHAGOGASTRODUODENOSCOPY TRANSORAL DIAGNOSTIC      EGD--folds in duodenum--path pending     PR ESOPHAGOGASTRODUODENOSCOPY TRANSORAL DIAGNOSTIC  04/2017    EGD; repeat EGD in 1 year     PR ESOPHAGOGASTRODUODENOSCOPY TRANSORAL DIAGNOSTIC  04/20/2018    gastritis; nodule--path pending    PR LIG/TRNSXJ FLP TUBE ABDL/VAG APPR UNI/BI      Tubal ligation    PR TOTAL ABDOMINAL HYSTERECT W/WO RMVL TUBE OVARY  1980s    partial; no BSO    PR TYMPANIC MEMB RPR W/WO PREPJ PERFOR PATCH      Tympanoplasty    REPAIR, BLEPHAROPTOSIS Bilateral 08/06/2015    Blepharoplasty bilateral upper lids   [3]   Current Outpatient Medications on File Prior to Visit   Medication Sig Dispense Refill    acetaminophen  (TylenoL ) 325 mg tablet Take 1 tablet by mouth if needed for pain (joint pain).      albuterol  (Proair  HFA, Proventil  HFA, Ventolin  HFA) 90 mcg/actuation inhaler Take 2-4 puffs by inhalation every 4 to 6 hours if needed for wheezing. 8.5 g 2    Amlodipine  (NORVASC) 10 mg Tablet Take 1 tablet by mouth every day. 90 tablet 3    Atorvastatin  (LIPITOR) 20 mg Tablet Take 1 tablet by mouth every day at bedtime. 90 tablet 3    Blood Sugar Diagnostic (ONETOUCH ULTRA TEST) Strips Use to test blood glucose 2x/day 100 strip 11    CloNIDine  (CATAPRES ) 0.2 mg Tablet Take 1 tablet by mouth 2 times daily. Indications: change of life signs 180 tablet 3    Clopidogrel  (PLAVIX ) 75 mg Tablet Take 1 tablet by mouth every morning. 90 tablet 3    Diclofenac  (VOLTAREN ARTHRITIS PAIN) 1 % Gel Apply to the affected area 4 times daily. 100 g 1     diphenhydrAMINE  (BenadryL ) 25 mg capsule Take 2 capsules by mouth every day at bedtime if needed (Itching).      insulin  glargine (Lantus  Solostar U-100 Insulin ) 100 unit/mL (3 mL) pen Inject 15 Units subcutaneously every day. Please provide patient with a 90 day supply 15 mL 3    Losartan  (COZAAR ) 100 mg tablet Take 1 tablet by mouth every day. 90 tablet 3    metFORMIN  (Glucophage -XR) 500 mg XR tablet Take 4 tablets by mouth every evening with a meal. 360 tablet 3    NYSTATIN  100,000 unit/gram Powder Apply a thin layer of powder to affected area 4 times a day as needed 60 g 0    Ondansetron  (ZOFRAN -ODT) 8 mg disintegrating tablet Take 1 tablet by mouth every 8 hours if needed. 90 tablet 2    peg-electrolyte soln (Colyte, Golytely) 236-22.74-6.74 -5.86 gram liquid Take as directed by [facility]. Do not follow instructions on the box. Disp-4000 mL, R-0, Pharmacy okay to dispense Gavilyte, C, G, or N. Also OK to dispense Nulytely, generic Moviprep, Colyte, or Golytely. 4000 mL 0    pen needle, diabetic (BD Ultra-Fine III Short Pen Needle) 31 gauge x 5/16 Use for injecting insulin  1 times per day 50 each 11     No current facility-administered medications on file prior to visit.

## 2024-02-02 NOTE — Patient Instructions (Signed)
 Instructions:    Please start Fluticasone  50 mcg [Allerflo] 1 spray each nostril, twice a day  lean back  aim towards the ear  wait at least 2-3 minutes  stop if you get a bad headache or nosebleed  use for at least 3 months    Please start allergy medication loratadine 10 mg [Allerclear], 1 tablet daily at night.   Please start albuterol  inhaler, 1 puff in the morning and 1 puff in the evening.     Start albuterol  7 days prior to pulmonary function testing.  If you develop increasing cough, phlegm production, fevers or chills, please call the pulmonary clinic immediately for further instructions. If you are unable to reach the pulmonary clinic, please seek medical attention with your primary care physician or go to the ER.   Please follow up in the pulmonary clinic in 6 weeks in the clinic, or sooner if needed.     Best wishes,  Dr. Aima Mcwhirt

## 2024-02-03 NOTE — Progress Notes (Unsigned)
 [name], If you are reviewing this progress note and have questions about the meaning or clinical terms being used, please feel free to bring it up when we talk next.  Medical notes are meant to be a communication tool between professional providers and require medical terms to be used for efficiency. You can also look up terms via on-line medical dictionaries such as Medline Plus at:  achegone.com    Medical Record Number:  [id number]   Patient:  Paula Daugherty   Date of Birth:  02-29-1946   Date of Visit:  02/04/2024     PPE USED:  Patient was wearing a surgical mask.no  Provider was wearing a surgical mask and face shield: no      Type of Visit: Individual Psychotherapy  Time: 60 min.   CPT Code: 09162 - Psychotherapy, (53 mins or more) 60 minutes with patient and/or family member.    Identifying Information/ Chief Complaint:     Kamarah Bilotta is a 78yr old female presenting for anxiety and depression symptoms.    Objective/Mental Status Exam:      Appearance: appropriate  Kinetics & gait: Normal gait, no apparent psychomotor retardation or agitation.   Demeanor: cooperative  Speech: Coherent, clear, appropriate volume, normal rhythm and flow  Language: Normal word choice, vocabulary adequate for communication.  Mood/Affect: Worried, Frustrated, Angry, and Sad mood with congruent to mood and appropriate to content affect.   Suicidal/Homicidal/Assaultive Thoughts/Intent/Plans:  Denies current SI/HI, intent, mean and plan.   Thought Process: Linear & goal directed  Thought Content: appropriate, no observed evidence of illogical thought.  Orientation:  Alert and Oriented  Attention: normal and follows conversation thread  Memory: No short or long-term memory deficits evident.   Judgment:  Good  Insight: Good    Assessment:     -This is patient's 1st session since intake appointment. The Patient is demonstrating lower scores on screening tools, and subjectively reports changes  in habits occurring.     -Patient is progressing towards goals as evidenced by continued participation in the therapeutic process and overall high investment in achieving therapeutic goals.    Safety    Pt's acute risk for suicide is low. Pt presents without current suicidal ideation, intent, plan, nor preparatory behavior.     Pt demonstrates no imminent risk of danger to others given no H/I reported today, no aggressive behavior, and no known h/o violence.      Plan to continue to assess and a higher level of care is not indicated at this time.    Medication questions:  Current medications reported as having side effects. Patient frustrated by causing tiredness and appetite difficulty.    Diagnosis:  [facility].   No new or revised diagnoses are needed at this time.     Validated Behavioral Health Measures:     PHQ-9:       01/27/2024    12:00 PM   PHQ-9   Interest 2   Feeling 2   Sleeping 1   Tired 3   Appetite 2   Feeling Bad 1   Concentrating 0   Moving and Speaking 0   Suicidal Thoughts 0   Problems Somewhat difficult   PHQ-9 Total 11   PHQ-9 Affective Total 5   PHQ-9 Physical Total 6   PHQ-9 Cognitive Total 0       GAD-7:       01/27/2024    12:00 PM   GAD7 Results   Feeling Nervous, Anxious,  or on Edge 1   Not Being Able to Stop or Control Worrying 1   Worrying too Much About Different Things 2   Trouble Relaxing 1   Being so Restless That it is Hard to Sit Still 0   Becoming Easily Annoyed or Irritable 3   Feeling Afraid as if Something Awful Might Happen 3   GAD-7 Total Score 11   If you checked off any problems, how difficult have these problems made it for you to do your work, take care of things at home, or get along with other people? Somewhat difficult        Psychotherapy Content:     Reann reports awareness of symptoms of low energy in motivation.  Patient states that medical issues as well as side effects were making a change more difficult.  The patient described interactions at led to  frustrations as well as negative coping skills to handle emotions.  Patient was encouraged to validate and acknowledged emotions caused by difficult social situations.  Patient was also encouraged to be more curious and identify possible reasons for behavior.  Patient stated intention of slowing down and breathing to improve response.  Patient was receptive to box breathing techniques to improve emotional response in physical health.  Patient and this Clinical research associate discussed methods of decreasing immediate response with positive thought process and engaging in physical activity of walking or a reasonable household task to feel more productive.    Interventions  Led client in review of perspective of relationships.  Introduced strategy of goal setting for changing habits.  Clinician encouraged reframing perspective of relationships.    Response to treatment  Sham was responsive to practicing mindfulness skills and felt they were helping.  Austine expressed feelings when prompted to use coping strategies.  Adasha willing to practice increased curiosity with desire for increased benefit of emotional awareness.  Antonae willing to practice reframing thought patterns and evaluate behavior rather than engage in negative self talk. Jazlene will continue practice CBT skills.     Therapeutic Modalities Used:      -Clinician utilized supportive psychotherapy interventions including empathic listening, validation, reflection, and problem solving.  -Clinician used motivational interviewing/Socratic questioning during the session.  -Clinician provided brief psychoeducation about CBT techniques commonly used to address depression/anxiety in therapy.    Plan:      -Patient may benefit from continued engagement in talk therapy and transition to longer term therapy support to facilitate continued adjustments to ongoing changes; continued re framing of interactions and behavioral activation to distract from negative thought pattern.    -Continue  current therapeutic focus with weekly to bi-weekly individual psychotherapy sessions.  -Next scheduled session: 02-25-24 at 9:00 AM    Resources: Positive Thought Replacement strategy, Behavior activation for positive response to stress.  Homework:1- 3 times a week Engage in physical activity  for 15 to 30 minutes for mood improvement.  Practice mindfulness techniques 3 times a week to decrease stimulus response.    Goals:  - Follow your Doctor's Guidelines to stay physically active. Exercise is good for your body, mental health and sleep. and - Consider a checklist or journal of daily activities as you get used to a new routine. Remember to provide yourself with positive feedback and PRAISE rather than criticism!    [place] Chyrl Milta KEN  License No [id number]  Valid until: 07/02/2024  Behavioral Health Clinician  Collaborative Care Program  [facility]  The patient understands and agrees with the plan of care outlined.    Have questions about billing or payments?  - Call [phone]; available Monday-Friday, 8:30am to 4pm  - Toll free: [phone]  - TDD (hearing impaired): [phone]  - Email: [email or web address]

## 2024-02-04 ENCOUNTER — Ambulatory Visit

## 2024-02-04 DIAGNOSIS — F332 Major depressive disorder, recurrent severe without psychotic features: Secondary | ICD-10-CM

## 2024-02-05 ENCOUNTER — Ambulatory Visit: Admitting: Student in an Organized Health Care Education/Training Program

## 2024-02-05 VITALS — BP 127/56 | HR 56 | Temp 98.2°F | Ht 62.0 in | Wt 164.2 lb

## 2024-02-05 DIAGNOSIS — R0989 Other specified symptoms and signs involving the circulatory and respiratory systems: Secondary | ICD-10-CM

## 2024-02-05 DIAGNOSIS — F418 Other specified anxiety disorders: Secondary | ICD-10-CM

## 2024-02-05 DIAGNOSIS — R2689 Other abnormalities of gait and mobility: Secondary | ICD-10-CM

## 2024-02-05 DIAGNOSIS — E785 Hyperlipidemia, unspecified: Secondary | ICD-10-CM

## 2024-02-05 DIAGNOSIS — E1129 Type 2 diabetes mellitus with other diabetic kidney complication: Secondary | ICD-10-CM

## 2024-02-05 DIAGNOSIS — E538 Deficiency of other specified B group vitamins: Secondary | ICD-10-CM

## 2024-02-05 DIAGNOSIS — E1121 Type 2 diabetes mellitus with diabetic nephropathy: Secondary | ICD-10-CM

## 2024-02-05 DIAGNOSIS — Z794 Long term (current) use of insulin: Secondary | ICD-10-CM

## 2024-02-05 DIAGNOSIS — M25471 Effusion, right ankle: Secondary | ICD-10-CM

## 2024-02-05 NOTE — Progress Notes (Unsigned)
 [facility] [place]  Family Medicine     Chief Complaint   Patient presents with    Lab Slip Request         ASSESSMENT and PLAN:    1. Right ankle swelling    2. Decreased dorsalis pedis pulse    3. Type 2 diabetes mellitus with other diabetic kidney complication, with long-term current use of insulin  (HCC)    4. Macroalbuminuric diabetic nephropathy (HCC)    5. B12 deficiency    6. Hyperlipidemia with target LDL less than 70    7. Loss of balance    8. Depression with anxiety         Assessment & Plan  Right ankle swelling and discomfort  Swelling and discomfort in the right ankle, possibly due to hyperglycemia causing nerve swelling and transient paresthesia. Pulses slightly decreased compared to the left foot.  - Order ankle brachial index to assess blood flow.  - Advise wearing compression socks and performing calf raises during travel.  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 Blood sugars elevated in the morning, improving but not consistently below 150 mg/dL. Insulin  increased to 20 units. Discussed potential GLP-1 agonist use if gastrointestinal symptoms improve. Neuropathy symptoms may be related to hyperglycemia.  - Monitor blood glucose levels, especially during paresthesia.  - Discuss potential GLP-1 agonist use with gastroenterology and endocrinologist.  - Order metabolic panel to monitor renal function.    Macroalbuminuric diabetic nephropathy   Chronic, ongoing. Renal function slightly elevated, possibly related to diabetes or medication. Renal cysts and proteinuria present.  - Monitor renal function with metabolic panel.  - Avoid NSAIDs like ibuprofen or naproxen.    Vitamin B12 deficiency  Confirmed B12 deficiency with elevated methylmalonic acid. Currently taking B12 supplements.  - Continue B12 supplementation.  - Order B12 level check in one month.    Hyperlipidemia with target LDL  less than 70   Chronic, ongoing. Triglycerides elevated. HDL excellent, but LDL could not be measured due to high triglycerides.  - Order lipid panel with reflex to LDL for next A1c check in December.    Loss of balance   Chronic, ongoing. Undergoing work up with [facility] clinic. Bradykinesia noted in L hand. Parkinson's on differential but not currently diagnosed. Recommended further management of mood as below.      Depression with anxiety   Chronic, ongoing.  Depression actively managed. Referral to behavioral health support. No current indication for Parkinson's disease despite family history.  - Continue behavioral health support.  - Monitor for changes in mood or symptoms.             SUBJECTIVE:  Paula Daugherty is a 78yr old female  here for chief complaint noted above.    History of Present Illness  Paula Daugherty is a 78 year old female with diabetes who presents with right ankle swelling and high blood sugar levels.    She experiences right ankle swelling for a few weeks, worsening throughout the day, with a lump on the lateral aspect. The skin is tight, causing discomfort, especially during ambulation. At night, significant swelling leads to numbness and tingling. There is no recent injury, but there is a history of a previous ankle fracture.    Her morning blood sugar levels consistently exceed 200 mg/dL, improving throughout the day but remaining above 150 mg/dL. She was switched  from metformin  to insulin , currently administering 20 units. She struggles with weight management, attributing difficulty to insulin  use.    She experiences confusion and frustration with her medical care due to poor communication among healthcare providers. She uses an albuterol  inhaler regularly for a persistent cough, which has improved. She denies shortness of breath or chest pain. She also takes Claritin and Flonase  as advised by her pulmonary specialist.    ROS:  As noted in HPI. 10 point review of systems was  otherwise negative.     Current Outpatient Medications   Medication Sig    acetaminophen  (TylenoL ) 325 mg tablet Take 1 tablet by mouth if needed for pain (joint pain).    albuterol  (Proair  HFA, Proventil  HFA, Ventolin  HFA) 90 mcg/actuation inhaler Take 2-4 puffs by inhalation every 4 to 6 hours if needed for wheezing.    Amlodipine  (NORVASC) 10 mg Tablet Take 1 tablet by mouth every day.    Atorvastatin  (LIPITOR) 20 mg Tablet Take 1 tablet by mouth every day at bedtime.    Blood Sugar Diagnostic (ONETOUCH ULTRA TEST) Strips Use to test blood glucose 2x/day    CloNIDine  (CATAPRES ) 0.2 mg Tablet Take 1 tablet by mouth 2 times daily. Indications: change of life signs    Clopidogrel  (PLAVIX ) 75 mg Tablet Take 1 tablet by mouth every morning.    Diclofenac  (VOLTAREN ARTHRITIS PAIN) 1 % Gel Apply to the affected area 4 times daily.    insulin  glargine (Lantus  Solostar U-100 Insulin ) 100 unit/mL (3 mL) pen Inject 15 Units subcutaneously every day. Please provide patient with a 90 day supply    Losartan  (COZAAR ) 100 mg tablet Take 1 tablet by mouth every day.    NYSTATIN  100,000 unit/gram Powder Apply a thin layer of powder to affected area 4 times a day as needed    Ondansetron  (ZOFRAN -ODT) 8 mg disintegrating tablet Take 1 tablet by mouth every 8 hours if needed.    pen needle, diabetic (BD Ultra-Fine III Short Pen Needle) 31 gauge x 5/16 Use for injecting insulin  1 times per day     No current facility-administered medications for this visit.        OBJECTIVE:   BP 127/56 (SITE: left arm, Orthostatic Position: sitting, Cuff Size: regular)   Pulse 56   Temp 36.8 C (98.2 F) (Temporal)   Ht 1.575 m (5' 2)   Wt 74.5 kg (164 lb 3.9 oz)   LMP 05/06/1983   SpO2 98%   BMI 30.04 kg/m      Physical Exam  MEASUREMENTS: Weight- 160.  EXTREMITIES:    Gen: appears well, NAD  HEENT: EOMI, sclera clear, anicteric   CV: normal rate, pulses palpable   Chest: trachea midline, non labored breathing, speaking in full sentences    Extrem: Pulse palpable in right foot with slight decrease compared to left. No cyanosis. Mild non pitting edema around R ankle, no edema in the shin.   Neuro: no focal deficits   Skin: no rashes or lesions   Psych: normal mood and affect   Hey call    Results  LABS  Blood Glucose: >200 mg/dL  Folate: 9.1 ng/mL  Methylmalonic Acid: elevated  Hemoglobin A1c: elevated  Thyroid  Function Tests: normal  Liver Enzymes: elevated  Alkaline Phosphatase: stable  Cholesterol: within normal limits  HDL: optimal  Triglycerides: elevated          02/05/2024    11:12 AM   PHQ-2   Interest 2   Feeling 2  PHQ-2 Total Score 4     No question data found.     Depression Treatment Plan:  see assessment/plan section of note      No follow-ups on file.    I did review patient's past medical and family/social history, no changes noted.  Barriers to Learning assessed: none. Patient verbalizes understanding of teaching and instructions.  Education Needs Identified?   no    Patient was counseled specifically on the following: diagnostic results, impressions, and/or recommended diagnostic studies reviewed, instructions for management and/or follow-up, risk factor reduction and patient and family education    I spent 30 minutes with the patient, >50% of which were spent counseling her regarding differential, additional testing, if indicated, potential treatment options, and lifestyle modifications, if applicable    Today's visit has complexity inherent to evaluation and management associated with medical care services that serve as the continuing focal point for all needed health care services.    I obtained verbal consent from the patient to use AI ambient technology to transcribe the interactions between the patient and myself during the clinical encounter.    Electronically signed by:  Logan Tawni Silvan, MD  Associate Physician  [facility]

## 2024-02-07 ENCOUNTER — Encounter: Payer: Self-pay | Admitting: Student in an Organized Health Care Education/Training Program

## 2024-02-09 ENCOUNTER — Ambulatory Visit
Admission: RE | Admit: 2024-02-09 | Discharge: 2024-02-09 | Disposition: A | Discharge status: Home / Self Care | Source: Ambulatory Visit | Attending: Student in an Organized Health Care Education/Training Program | Admitting: Student in an Organized Health Care Education/Training Program

## 2024-02-09 DIAGNOSIS — R0989 Other specified symptoms and signs involving the circulatory and respiratory systems: Secondary | ICD-10-CM

## 2024-02-09 DIAGNOSIS — M25471 Effusion, right ankle: Secondary | ICD-10-CM

## 2024-02-10 ENCOUNTER — Ambulatory Visit: Payer: Self-pay | Admitting: NURSE PRACTITIONER

## 2024-02-18 NOTE — Progress Notes (Addendum)
 Paula Daugherty, If you are reviewing this progress note and have questions about the meaning or clinical terms being used, please feel free to bring it up when we talk next.  Medical notes are meant to be a communication tool between professional providers and require medical terms to be used for efficiency. You can also look up terms via on-line medical dictionaries such as Medline Plus at:  MuscleTreatments.it.htm      Patient was discussed during Collaborative Care (CoCM) Systematic Case Review (SCR) meeting.    Assessment    - Paula Daugherty, would benefit from practicing behavioral activation techniques, mindfulness, and learning problem solving strategies to mitigate depressive and anxious symptomology.    - Patient's progress in treatment may be improved by boundary setting strategies.      Plan  - Continue current therapeutic focus w/ weekly with transition to bi-weekly individual psychotherapy sessions.    - No medication recommendations have been provided to Brien Logan Maus, MD at this time.      Chyrl Ply, LCSW  License No [id number]  Valid until: 07/02/2024  Behavioral Health Clinician  Collaborative Care Program  [facility]      I'm a Collaborative Care [facility]) Program Psychiatry consultant Psychiatry Mental Health Nurse Practitioner at [facility]. This patient was discussed in [facility] on 02/18/24 Reyes Ply, LCSW. I have not personally examined or met with this patient. I do agree with LCSW's assessment (see note).     Based on this assessment, agree w/ plan for patient to continue psychotherapy visits with LCSW.     No provider treatment recommendations at this time.     Thank you for referring patient to [facility].     Electronically signed by: Clarita Mallick, DNP, FNP-C, PMHNP-BC  Collaborative Care Provider   [facility]

## 2024-02-19 ENCOUNTER — Encounter

## 2024-02-22 ENCOUNTER — Other Ambulatory Visit: Payer: Self-pay | Admitting: Student in an Organized Health Care Education/Training Program

## 2024-02-22 DIAGNOSIS — L304 Erythema intertrigo: Secondary | ICD-10-CM

## 2024-02-23 ENCOUNTER — Encounter: Payer: Self-pay | Admitting: "Endocrinology

## 2024-02-23 NOTE — Telephone Encounter (Signed)
 Pharmacy Refill Optimization (PRO)    Refill authorized per PRO CPA 690-00 02/23/2024    Meets PRO CPA 690-00: YES    ====================================================================    Provider: Brien Logan Maus, MD  Next appointment: 03/16/24   Last appointment: 02/05/24   Medication Name:   Requested Prescriptions     Pending Prescriptions Disp Refills    Klayesta  100,000 unit/gram powder [Pharmacy Med Name: KLAYESTA  TOP PWDR 100,000 60GM] 60 g 0     Sig: APPLY A THIN LAYER OF POWDER TO THE AFFECTED AREA FOUR TIMES DAILY AS NEEDED     Last prescribed date per EMR: 10/28/23  Med last filled: 10/29/23 60/25  Med last reviewed/reconciled: 06/25/23 Meds marked reviewed

## 2024-02-23 NOTE — Telephone Encounter (Signed)
 SUBJECTIVE/OBJECTIVE:  Outgoing call placed to Paula Daugherty, 78yr old female. Patient sent in blood sugar logs, values ranging 161 - 472. Patient states that she is now taking 30 units of long-acting insulin . Patient denies any symptoms associated with the high readings. She reports trying a rice ball snack in recent days, but otherwise eating habits remain unchanged.     Current medications reviewed. 30 units long-acting insulin  daily    Listed allergies reviewed yes: Sulfa (sulfonamide antibiotics), Contrast dye [radiopaque agent], Hydrochlorothiazide, Januvia [sitagliptin], Keflex  [cephalexin ], Lyrica  [pregabalin ], Omnipaque  [iohexol ], Prozac [fluoxetine hcl], and Topiramate    Problem List reviewed. High blood sugar readings    IMPRESSION: Patient alert and conversant. Able to describe in detail current situation      PLAN:  Consult with MD re: insulin  titration    The patient does indicate she understands, agrees, and feels comfortable with the plan. Advised to call back directly if there are further questions, or if these symptoms fail to improve as anticipated or worsen.    Duwaine Caulk, RN

## 2024-02-24 ENCOUNTER — Ambulatory Visit: Attending: GASTROENTEROLOGY | Admitting: GASTROENTEROLOGY

## 2024-02-24 VITALS — BP 127/65 | HR 64 | Temp 96.2°F | Resp 20 | Ht 62.0 in | Wt 165.1 lb

## 2024-02-24 DIAGNOSIS — K7460 Unspecified cirrhosis of liver: Secondary | ICD-10-CM | POA: Insufficient documentation

## 2024-02-24 DIAGNOSIS — K760 Fatty (change of) liver, not elsewhere classified: Secondary | ICD-10-CM | POA: Insufficient documentation

## 2024-02-24 DIAGNOSIS — K31819 Angiodysplasia of stomach and duodenum without bleeding: Secondary | ICD-10-CM | POA: Insufficient documentation

## 2024-02-24 NOTE — Progress Notes (Signed)
 OUTPATIENT HEPATOLOGY CLINIC FOLLOW UP    NAME: Paula Daugherty  DOB: 02/10/1946         ASSESSMENT AND PLAN:  1. Cirrhosis of liver without ascites, unspecified hepatic cirrhosis type (HCC)  No signs of clincally significant portal hypertension. Fibrosis on borderline of cirrhosis but reasonable to start hepatocellular carcinoma surveillance. No indication for starting NSBB or screening for varices at this time.    - Labs every 3-6 months   - US  and AFP every 6 months   - HAV non-immune. Consider vaccination.   - Return to clinic in 6 months      2. GAVE  Anemia improved status post banding of GAVE. Patient prefers to have consistent follow up and no indication for hospital-based endoscopy. Did require max moderate sedation and was uncomfortable.   - EGD with banding of GAVE under MAC at [facility]   - Advised to try prenatal vitamin   - Monitor Hb and ferritin. Parenteral iron  as needed       3. Metabolic dysfunction-associated steatotic liver disease (MASLD)  Lack of steatosis on FibroScan is surprising but can be seen in later stages of disease and chronic liver disease work up negative for other causes. Discussed possibility of GLP-1 agonists and possible intolerance due to nausea, but she would like to consider a trial of it. Evolving data demonstrate efficacy of these therapies and semaglutide is now FDA approved for MASH.   - Will relay to Drs. Brien and [facility] for possible trial of semaglutide.       Tracey Stewart L. Barre Aydelott, MD  Professor    I spent a total of 40 minutes on the day of the visit.    Today's visit has complexity inherent to evaluation and management associated with medical care services that are part of ongoing care related to a patient's single, serious condition or a complex condition, which is cirrhosis.         Chief Complaint: MASH Cirrhosis    History of Present  Illness:  Paula Daugherty is a 78yr old female seen in follow up for MASH cirrhosis. Previously followed by Dr. Dillon with F3 fibrosis by FibroScan in 2017 (LSM 12.9 kPa, 18% IQR). FibroScan 01/19/2023 consistent with F4 (15.2 kPa, 16% IQR) with low CAP (240 dB/m consistent with S0). No evidence of clincally significant portal hypertension on imaging or upper endoscopy.     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 Negative upper endoscopy, colonoscopy, and capsule endoscopy    No history of hepatic decompensation. History of CVA.    Interval history: No melena or hematemesis. BRBR from hemorrhoids frequently. Anemia improved but ferritin remains low. B12 also a bit low. Notes blood sugars high especially in the mornings.     Allergies: Allergies[1]    Medications  Medications Taking[2]    Past Medical History[3]    Past Surgical History[4]   Family History[5]  Social History[6]  Physical Exam  Constitutional:       Appearance: Normal appearance. She is not ill-appearing.   HENT:      Head: Normocephalic and atraumatic.   Eyes:      General: No scleral icterus.  Cardiovascular:      Rate and Rhythm: Normal rate.   Pulmonary:      Effort: Pulmonary effort is normal. No respiratory distress.   Skin:     Coloration: Skin is not jaundiced.   Neurological:  Mental Status: She is alert.         Lab Results   Lab Name Value Date/Time    WBC 8.1 01/11/2024 10:23 AM    WBC 8.9 08/30/2015 08:35 AM    HGB 11.5 (L) 01/11/2024 10:23 AM    HGB 14.4 08/30/2015 08:35 AM    HCT 36.3 01/11/2024 10:23 AM    HCT 44.6 08/30/2015 08:35 AM    PLT 252 01/11/2024 10:23 AM    PLT 217 08/30/2015 08:35 AM       Lab Results   Lab Name Value Date/Time    NA 141 01/27/2024 11:09 AM    NA 142 08/30/2015 08:35 AM    K 4.9 01/27/2024 11:09 AM    K 4.0 08/30/2015 08:35 AM    CL 103 01/27/2024 11:09 AM    CL 104 08/30/2015 08:35 AM    CO2 23 01/27/2024 11:09 AM     CO2 22 (L) 08/30/2015 08:35 AM    BUN 23 (H) 01/27/2024 11:09 AM    BUN 19 08/30/2015 08:35 AM    CR 1.34 (H) 01/27/2024 11:09 AM    CR 0.88 08/30/2015 08:35 AM    GLU 153 (H) 01/27/2024 11:09 AM    GLU 139 (H) 08/30/2015 08:35 AM     Lab Results   Lab Name Value Date/Time    AST 36 01/27/2024 11:09 AM    AST 71 (H) 08/30/2015 08:35 AM    ALT 33 01/27/2024 11:09 AM    ALT 69 (H) 08/30/2015 08:35 AM    ALP 122 01/27/2024 11:09 AM    ALP 104 08/30/2015 08:35 AM    ALB 4.2 01/27/2024 11:09 AM    ALB 4.2 08/30/2015 08:35 AM    TP 7.9 01/27/2024 11:09 AM    TP 8.5 (H) 08/30/2015 08:35 AM    TBIL 0.8 01/27/2024 11:09 AM    TBIL 0.9 08/30/2015 08:35 AM     Lab Results   Component Value Date    HGBA1C 7.5 (H) 01/18/2024       Viral Hepatitis Serologies  Lab Results   Component Value Date    HEPAABIGG Nonreactive 10/15/2023    HBSAG Nonreactive 12/08/2013    HEPCABSCR Nonreactive 06/13/2022        Autoimmune Liver Disease Antibodies  Lab Results   Component Value Date    ANTIMITOABSC Negative 10/15/2023       Genetic Liver Disease Screen  Lab Results   Component Value Date    IRONSAT 16.9 06/25/2023    FRTN 14 01/11/2024    CERULOPLASMI 27.2 10/15/2023       FIB4  Fib-4 Score: 3.49    The below cut-offs are for NASH patients only; cut-offs are different for other liver diseases.    <1.3: very low likelihood of liver fibrosis  1.3 -2.67: intermediate risk for liver fibrosis.   >2.67: risk for advanced liver fibrosis.     MELD  MELD 3.0: 9 at 10/15/2023  9:59 AM  MELD-Na: 7 at 10/15/2023  9:59 AM  Calculated from:  Serum Creatinine: 1.07 mg/dL at 3/87/7974  0:40 AM  Serum Sodium: 136 mmol/L at 10/15/2023  9:59 AM  Total Bilirubin: 0.9 mg/dL (Using min of 1 mg/dL) at 3/87/7974  0:40 AM  Serum Albumin: 4.1 g/dL (Using max of 3.5 g/dL) at 3/87/7974  0:40 AM  INR(ratio): 1 at 10/15/2023  9:59 AM  Age at listing (hypothetical): 77 years  Sex: Female at 10/15/2023  9:59 AM  IMAGING (I personally reviewed and interpreted the  following imaging studies)  As noted above    PROCEDURES  As noted above.              [1]   Allergies  Allergen Reactions    Sulfa (Sulfonamide Antibiotics) Rash    Contrast Dye [Radiopaque Agent] Hives     Reports has recently tolerated well    Hydrochlorothiazide Other-Reaction in Comments     Patient reports    Januvia [Sitagliptin] Other-Reaction in Comments     Patient reports    Keflex  [Cephalexin ] Abdominal Pain    Lyrica  [Pregabalin ] Other-Reaction in Comments     Patient reports    Omnipaque  [Iohexol ] Other-Reaction in Comments     Patient reports    Prozac [Fluoxetine Hcl] Other-Reaction in Comments     Patient reports    Topiramate Other-Reaction in Comments     Hairfall   [2]   Outpatient Medications Marked as Taking for the 02/24/24 encounter (Office Visit) with Venancio Chenier, Lonni Collum, MD   Medication Sig Dispense Refill    Amlodipine  (NORVASC) 10 mg Tablet Take 1 tablet by mouth every day. 90 tablet 3    Atorvastatin  (LIPITOR) 20 mg Tablet Take 1 tablet by mouth every day at bedtime. 90 tablet 3    CloNIDine  (CATAPRES ) 0.2 mg Tablet Take 1 tablet by mouth 2 times daily. Indications: change of life signs 180 tablet 3    Clopidogrel  (PLAVIX ) 75 mg Tablet Take 1 tablet by mouth every morning. 90 tablet 3    insulin  glargine (Lantus  Solostar U-100 Insulin ) 100 unit/mL (3 mL) pen Inject 15 Units subcutaneously every day. Please provide patient with a 90 day supply 15 mL 3    Losartan  (COZAAR ) 100 mg tablet Take 1 tablet by mouth every day. 90 tablet 3    Ondansetron  (ZOFRAN -ODT) 8 mg disintegrating tablet Take 1 tablet by mouth every 8 hours if needed. 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   [4]   Past Surgical History:  Procedure Laterality Date    BIOFEEDBACK PERI/URO/RECTAL  10/21/2017    session #1: N Score = 5.4    BIOFEEDBACK PERI/URO/RECTAL  11/04/2017    session #2    BIOFEEDBACK PERI/URO/RECTAL  11/18/2017    Session #3    BIOFEEDBACK PERI/URO/RECTAL  12/02/2017    Session #4    BIOFEEDBACK PERI/URO/RECTAL  12/30/2017    Session #5    BIOFEEDBACK PERI/URO/RECTAL  03/23/2018    Session #6    CAPSULE ENDOSCOPY  01/27/2018    CAPSULE ENDOSCOPY  01/29/2018         CAPSULE ENDOSCOPY  12/23/2021    wnl    COLONOSCOPY  01/04/2014  polyp--TA and erosion; hemorrhoids; tics; repeat x 3 yrs    COLONOSCOPY  12/16/2017    TA; repeat in 3 yrs    COLONOSCOPY  04/05/2021    polyps--path pending; hemorrhoids    EGD  04/2020    gastritis    EGD  04/08/2019    biopsy path--pending    EGD  04/05/2021    biopsies at tattoo--path pending    [facility] BIOFEEDBACK PERI/URO/RECTAL  05/02/2018         HC COLONOSCOPY,REMV LESN,SNARE  12/16/2017    MAMMOPLASTY, REDUCTION  1985    MANOMETRY  09/02/2017    work on constipation; kegels and biofeedback; proceed w/colonoscopy     PHACOEMULSIFICATION, CATARACT Right 02/12/2015    with IOL    PR ARTHROSCOPY KNEE DIAGNOSTIC W/WO SYNOVIAL BX SPX  1980s    Knee arthroscopy    PR ESOPHAGOGASTRODUODENOSCOPY TRANSORAL DIAGNOSTIC  02/12/2016    EGD--polyp--path pending; antral gastritis; no varices     PR ESOPHAGOGASTRODUODENOSCOPY TRANSORAL DIAGNOSTIC  06/11/2016    EGD--biopsy at site of carcinoid tumor path pending     PR ESOPHAGOGASTRODUODENOSCOPY TRANSORAL DIAGNOSTIC      EGD--folds in duodenum--path pending     PR ESOPHAGOGASTRODUODENOSCOPY TRANSORAL DIAGNOSTIC  04/2017    EGD; repeat EGD in 1 year     PR ESOPHAGOGASTRODUODENOSCOPY TRANSORAL DIAGNOSTIC   04/20/2018    gastritis; nodule--path pending    PR LIG/TRNSXJ FLP TUBE ABDL/VAG APPR UNI/BI      Tubal ligation    PR TOTAL ABDOMINAL HYSTERECT W/WO RMVL TUBE OVARY  1980s    partial; no BSO    PR TYMPANIC MEMB RPR W/WO PREPJ PERFOR PATCH      Tympanoplasty    REPAIR, BLEPHAROPTOSIS Bilateral 08/06/2015    Blepharoplasty bilateral upper lids   [5]   Family History  Problem Relation Name Age of Onset    Diabetes Father      Heart Mother          MI at 35s     Non-contributory Brother      Non-contributory Brother     [6]   Social History  Tobacco Use    Smoking status: Former     Current packs/day: 0.10     Average packs/day: 0.1 packs/day for 20.0 years (2.0 ttl pk-yrs)     Types: Cigarettes     Passive exposure: Never    Smokeless tobacco: Never    Tobacco comments:     quit 30 years ago   Vaping Use    Vaping status: Never Used   Substance Use Topics    Alcohol use: Yes     Alcohol/week: 0.0 standard drinks of alcohol     Comment: rare    Drug use: No

## 2024-02-24 NOTE — Patient Instructions (Addendum)
 Reschedule your upper endoscopy for [place] with me.     I will discuss Ozempic with Dr. Brien and Ginny.     Schedule an ultrasound for liver cancer screening. [phone]    Have labs per your other doctors.

## 2024-02-24 NOTE — Nursing Note (Signed)
Patient is in room #2    Medication list given at the time of check in for review by patient, instructed patient to indicate to the doctor any corrections that need to be made for Medication Reconciliation.    Patient identifiers obtained. vital signs taken, screened for pain, allergies checked, patient roomed, and Christopher Lawson Bowlus, MD notified.     Analaura Messler, MA

## 2024-02-25 ENCOUNTER — Ambulatory Visit

## 2024-02-25 DIAGNOSIS — F332 Major depressive disorder, recurrent severe without psychotic features: Secondary | ICD-10-CM

## 2024-02-25 NOTE — Progress Notes (Signed)
 Paula Daugherty Lunger, If you are reviewing this progress note and have questions about the meaning or clinical terms being used, please feel free to bring it up when we talk next.  Medical notes are meant to be a communication tool between professional providers and require medical terms to be used for efficiency. You can also look up terms via on-line medical dictionaries such as Medline Plus at:  achegone.com    Medical Record Number:  [id number]   Patient:  Paula Daugherty   Date of Birth:  01-20-1946   Date of Visit:  02/25/2024     PPE USED:  Patient was wearing a surgical mask.no  Provider was wearing a surgical mask and face shield: no      Type of Visit: Individual Psychotherapy  Time: 45 min.   CPT Code: 09165 - Psychotherapy, (38-52 mins) 45 minutes with patient and/or family member.    Identifying Information/ Chief Complaint:     Paula Daugherty is a 78yr old female presenting for anxiety and depression symptoms.    Objective/Mental Status Exam:      Appearance: appropriate  Kinetics & gait: Normal gait, no apparent psychomotor retardation or agitation.   Demeanor: cooperative  Speech: Coherent, clear, appropriate volume, normal rhythm and flow  Language: Normal word choice, vocabulary adequate for communication.  Mood/Affect: Frustrated, Irritable, and Sad mood with congruent to mood and appropriate to content affect.   Suicidal/Homicidal/Assaultive Thoughts/Intent/Plans:  Denies current SI/HI, intent, mean and plan.   Thought Process: Linear & goal directed  Thought Content: appropriate, no observed evidence of illogical thought.  Orientation:  Alert and Oriented  Attention: normal and follows conversation thread  Memory: No short or long-term memory deficits evident.   Judgment:  Good  Insight: Fair    Assessment:     -This is patient's 2nd session since intake appointment. The Patient is demonstrating higher scores on screening tools, and subjectively reports good vacation,  continued poor interactions with Dtr.     -Patient is progressing towards goals as evidenced by continued participation in the therapeutic process and overall high investment in achieving therapeutic goals.    Safety    Pt's acute risk for suicide is low. Pt presents without current suicidal ideation, intent, plan, nor preparatory behavior.     Pt demonstrates no imminent risk of danger to others given no H/I reported today, no aggressive behavior, and no known h/o violence.      Plan to continue to assess and a higher level of care is not indicated at this time.      Medication questions:  Medications not discussed this session.    Diagnosis:   No new or revised diagnoses are needed at this time.     Validated Behavioral Health Measures:     PHQ-9:       02/05/2024    11:12 AM   PHQ-9   Interest 2   Feeling 2   Sleeping 1   Tired 3   Appetite 2   Feeling Bad 1   Concentrating 0   Moving and Speaking 0   Suicidal Thoughts 0   Problems Somewhat difficult   PHQ-9 Total 11   PHQ-9 Affective Total 5   PHQ-9 Physical Total 6   PHQ-9 Cognitive Total 0       GAD-7:       02/04/2024    11:08 AM   GAD7 Results   Feeling Nervous, Anxious, or on Edge 1   Not Being Able to Stop or  Control Worrying 1   Worrying too Much About Different Things 2   Trouble Relaxing 0   Being so Restless That it is Hard to Sit Still 0   Becoming Easily Annoyed or Irritable 2   Feeling Afraid as if Something Awful Might Happen 0   GAD-7 Total Score 6   If you checked off any problems, how difficult have these problems made it for you to do your work, take care of things at home, or get along with other people? Somewhat difficult        Psychotherapy Content:     Paula Daugherty reports having had a good trip to [place]  to see a close friend recently.  Paula Daugherty noted that interactions with family continued to in negative effect on mood.  Patient reports efforts to have a positive interaction with her daughter.  Patient states choices for activities for herself and  husband some hesitation about her response.  Patient to continue to refine mindfulness activities to keep worries in limited amount of space and energy to support positive activities during the day and more effective sleep.      Interventions  Led client in review of recent interactions using CBT model to assess.  Clinician encouraged planned use of journaling to increase awareness for choices in life.  Introduced strategy of reality testing to identify logic and bias in thoughts and feelings.    Response to treatment  Paula Daugherty was responsive to practicing mindfulness skills and felt they were helping.  Paula Daugherty expressed feelings when prompted to use coping strategies.  Paula Daugherty rated decreased uncomfortable feelings and attributed the change to emotional regulation showing some improvement with sleep.  Paula Daugherty willing to practice mood diary with increased benefit of emotional awareness and direct the arousal of triggers.   Paula Daugherty willing to practice reframing thought patterns and evaluate behavior rather than engage in negative self talk. Paula Daugherty will practice CBT skills.     Therapeutic Modalities Used:      -Clinician utilized supportive psychotherapy interventions including empathic listening, validation, reflection, and problem solving.  -Clinician used motivational interviewing during the session.  -Clinician provided brief psychoeducation about CBT techniques commonly used to address depression/anxiety in therapy.  -Clinician utilized a strength-based perspective to assist patient in exploring skills and strengths to be used in overcoming current difficulties.    Plan:      -Patient may benefit from continued engagement in talk therapy and to facilitate continued adjustments; continued socialization and boundary setting and emotional regulation will be pursued.    -Continue current therapeutic focus with weekly to bi-weekly individual psychotherapy sessions.  -Next scheduled session: 03-10-24 at 10:15    Resources: CBT  Handout   and Emotional awareness Handout  Homework:Review and use grounding technique 1-3 times in the next week.  1- 3 times a week Journal experiences during the day for 15 minutes that appeared to trigger negative thought patterns. and Identify 1 thing to do per day for intentional increased pleasure.    Goals: - Review Handout or model discussed in treatment to identify if current strategy is useful. and - Consider a checklist or journal of daily activities as you get used to a new routine. Remember to provide yourself with positive feedback and PRAISE rather than criticism!    In [place] Chyrl Milta KEN  License No [id number]  Valid until: 07/02/2024  Behavioral Health Clinician  Collaborative Care Program  [facility]      The patient understands and agrees  with the plan of care outlined.    Have questions about billing or payments?  - Call [phone]; available Monday-Friday, 8:30am to 4pm  - Toll free: [phone]  - TDD (hearing impaired): [phone]  - Email: [email or web address]

## 2024-02-26 ENCOUNTER — Encounter: Payer: Self-pay | Admitting: Student in an Organized Health Care Education/Training Program

## 2024-02-26 ENCOUNTER — Ambulatory Visit: Payer: Self-pay | Admitting: Student in an Organized Health Care Education/Training Program

## 2024-02-26 DIAGNOSIS — E1129 Type 2 diabetes mellitus with other diabetic kidney complication: Secondary | ICD-10-CM

## 2024-02-26 NOTE — Telephone Encounter (Signed)
 Forwarding to Provider to review and respond to patient.  Note to Provider: This MyChart message may be appropriate to be converted into an E-Visit encounter because it involves: Request to change Medication or Treatment plan (NOT for controlled substances)    Ozempic start date

## 2024-02-26 NOTE — Telephone Encounter (Signed)
 3 patient identifiers used.  Per:   patient.    Disposition: HOME CARE - INFORMATION OR ADVICE ONLY CALL   Will send message to Oleh Sanders    Per:   patient verbalizes agreement to plan. Agrees to callback with any increase in symptoms/concerns or questions.    See Assessment Below  Patient calling states received message from Oleh Sanders regarding Clonidine  oral suspension.  Patient states she does not take Clonidine  oral suspension.  Patient takes Clonidine  0.2 mg tab rx by Dr. Starlette.  Patient is fine with RX and did not call for refill.    Clonidine  0.1mg /ml oral suspension (compounded drug) will now be sent to   [facility]  Address: [address], [place], [place] [address]  Hours: Open ? Closes 7?PM  Phone: [phone]    Asking if this message may have been meant for someone else.      Will send message to Oleh Sanders.     Hargis Cava, RN  PCN Triage      See Care Advice Below  Care Advice            Information Only Call - No Daryll FORBES Cava, RN Fri Feb 26, 2024 04:34 PM      Care Advice       CALL BACK IF:  * You have more questions                            See Protocol and Disposition Below  Reason for Disposition   General information question, no triage required and triager able to answer question    Protocols used: Information Only Call - No Triage-Adult-AH

## 2024-02-29 ENCOUNTER — Ambulatory Visit: Attending: Student in an Organized Health Care Education/Training Program | Admitting: ALLERGY & IMMUNOLOGY

## 2024-02-29 ENCOUNTER — Encounter: Payer: Self-pay | Admitting: ALLERGY & IMMUNOLOGY

## 2024-02-29 ENCOUNTER — Encounter: Payer: Self-pay | Admitting: Student in an Organized Health Care Education/Training Program

## 2024-02-29 VITALS — BP 133/60 | HR 67 | Temp 97.9°F | Resp 18 | Ht 65.0 in | Wt 166.6 lb

## 2024-02-29 DIAGNOSIS — L509 Urticaria, unspecified: Secondary | ICD-10-CM | POA: Insufficient documentation

## 2024-02-29 MED ORDER — OZEMPIC 0.25 MG OR 0.5 MG (2 MG/3 ML) SUBCUTANEOUS PEN INJECTOR: 0.2500 mg | PEN_INJECTOR | SUBCUTANEOUS | 2 refills | Status: DC

## 2024-02-29 NOTE — Patient Instructions (Signed)
-   Stop Claritin (loratadine).  - Start cetirizine 10 mg once daily. If hives continue, take an additional 5 mg dose as needed.   - Avoid NSAIDs and alcohol    High-dose oral antihistamine use is associated with drowsiness. Please discontinue therapy if you feel drowsy on the medication.

## 2024-02-29 NOTE — Progress Notes (Unsigned)
 Paula Daugherty, was seen for a consult in the Allergy and Immunology Clinic at the [facility] of [place] , [place] on February 29, 2024.       CC:  Urticaria    History of Present Illness:  Paula Daugherty is a 78yr old female with a history of hypertension, hyperlipidemia, diabetes mellitus, GERD, and NASH who presents to the allergy/immunology clinic for evaluation of urticaria on hands.                ROS: A complete review of systems was performed and was negative except as mentioned in the above history.    Past Medical History:  -  See above      Current Medications:  Current Medications[1]      Social History:  Home: ***  Occupation: ***  Pets: No pets   Smoking: Never smoker    Physical Exam:  Vital Signs- LMP 05/06/1983   General:  Patient is awake, alert, & oriented & in no acute distress.  HEENT:  Nasal mucosa appears ***, there are *** polyps. Oral mucosa appears normal and there are no lesions noted. Pupils are equally round and reactive to light. Sclerae are not injected.   RESP:  Lungs are clear to auscultation bilaterally without wheezes, rales, or rhonchi.  CV:  Regular rate and rhythm with no murmurs or gallops noted.   MSK:  No cyanosis. No clubbing.  SKIN:  Skin shows *** rash.       Labs/Studies:    Ojad:[id number]        Assessment/Plan:  Atiya Daugherty is a 78yr old female who presents to allergy/immunology clinic for evaluation of urticaria.      Diagnoses and all orders for this visit:  Urticaria      Thank you for allowing us  to participate in the care of your patient. If there are any questions or concerns, please do not hesitate to contact us .    Dnya Hickle, MD  Attending Physician  Allergy/Immunology              [1]   Current Outpatient Medications   Medication Sig Dispense Refill    acetaminophen  (TylenoL ) 325 mg tablet Take 1 tablet by mouth if needed for pain (joint pain).      albuterol  (Proair  HFA, Proventil  HFA, Ventolin  HFA) 90 mcg/actuation inhaler Take 2-4 puffs by inhalation every 4  to 6 hours if needed for wheezing. 8.5 g 2    Amlodipine  (NORVASC) 10 mg Tablet Take 1 tablet by mouth every day. 90 tablet 3    Atorvastatin  (LIPITOR) 20 mg Tablet Take 1 tablet by mouth every day at bedtime. 90 tablet 3    Blood Sugar Diagnostic (ONETOUCH ULTRA TEST) Strips Use to test blood glucose 2x/day 100 strip 11    CloNIDine  (CATAPRES ) 0.2 mg Tablet Take 1 tablet by mouth 2 times daily. Indications: change of life signs 180 tablet 3    Clopidogrel  (PLAVIX ) 75 mg Tablet Take 1 tablet by mouth every morning. 90 tablet 3    Diclofenac  (VOLTAREN ARTHRITIS PAIN) 1 % Gel Apply to the affected area 4 times daily. 100 g 1    insulin  glargine (Lantus  Solostar U-100 Insulin ) 100 unit/mL (3 mL) pen Inject 15 Units subcutaneously every day. Please provide patient with a 90 day supply 15 mL 3    Losartan  (COZAAR ) 100 mg tablet Take 1 tablet by mouth every day. 90 tablet 3    nystatin  (Klayesta ) 100,000 unit/gram powder APPLY A THIN LAYER OF POWDER  TO THE AFFECTED AREA FOUR TIMES DAILY AS NEEDED 60 g 0    Ondansetron  (ZOFRAN -ODT) 8 mg disintegrating tablet Take 1 tablet by mouth every 8 hours if needed. 90 tablet 2    pen needle, diabetic (BD Ultra-Fine III Short Pen Needle) 31 gauge x 5/16 Use for injecting insulin  1 times per day 50 each 11     No current facility-administered medications for this visit.

## 2024-02-29 NOTE — Progress Notes (Signed)
 Paula Daugherty, was seen for a consult in the Allergy and Immunology Clinic at the [facility] of [place] , [place] on February 29, 2024.     CC:  Urticaria    History of Present Illness:  Paula Daugherty is a 78yr old female with a history of hypertension, hyperlipidemia, diabetes mellitus, GERD, and NASH who presents to the allergy/immunology clinic for evaluation of urticaria on hands. These urticaria episodes started about a year ago and occur sporadically without any clear triggers. She notices them most on the palms of her hands, but will intermittently have them on her arms and elbows as well. Had one episode of the urticaria rash with more pronounced welts on her abdomen 3 weeks ago that started randomly without any food, medication, or topical triggers. It went away on its own over the course of a few days.     Episodes last a few hours to overnight at most.   Timing between each episode varies (from days to months).   Main symptoms during each episode are erythematous, pruritic rash on palms +/- extension into her forearm.   Used to take benadryl  for relief, but neurologist had her d/c it due to concern for cognitive side effects.  Now uses cortisol creams/ lotions with some relief. But the rash usually goes away on its own.    Denies any obvious triggers (no recent meds started. Rash does not come on after infections. No food triggers. No correlation with hold/cold temp changes. Not triggered by stress).   Since it started a year ago, she feels like its been getting worse on her hands.   Denies any other associated sx with these urticarial episodes. (No wheezing. No sob. No angioedema).  Denies any burning sensation associated with the rash.   Denies any residual hyperpigmentation once rash clears.   Denies any thyroid  issues or known autoimmune conditions.   Denies any other known dermatologic issues.   Known allergies to medications as listed in chart.     Has a remote hx of eczema that she managed with  topical steroids. But has not had an eczema flare since over 10 years ago. Lysle of her eczema rashes are different than ones she presents with.       Past Medical History:  -  See above    ROS: negative except per HPI.    Current Medications:  Current Medications[1]    Social History:  Home: Lives at home with husband  Occupation: retied  Pets: No pets   Smoking: prior smoker quit in 1988  New vitamins:   B12 vitamin started 1 month ago.  Biotin started 1 month ago.     Physical Exam:  Vital Signs- LMP 05/06/1983   General:  Patient is awake, alert, & oriented & in no acute distress.  HEENT:  Oral mucosa appears normal and there are no lesions noted.   RESP:  Breathing comfortably on room air.  CV:  Deferred  SKIN:  Skin on bilateral hands, bilateral arms, bilateral legs, back, neck, and abdomen are clear of any hives, welts, or erythematous lesions. No active rashes appreciated.     Labs/Studies:   Chemistries:    Lab Results   Lab Name Value Date/Time    NA 141 01/27/2024 11:09 AM    NA 142 08/30/2015 08:35 AM    K 4.9 01/27/2024 11:09 AM    K 4.0 08/30/2015 08:35 AM    CL 103 01/27/2024 11:09 AM    CL 104 08/30/2015 08:35 AM  CO2 23 01/27/2024 11:09 AM    CO2 22 (L) 08/30/2015 08:35 AM    BUN 23 (H) 01/27/2024 11:09 AM    BUN 19 08/30/2015 08:35 AM    CR 1.34 (H) 01/27/2024 11:09 AM    CR 0.88 08/30/2015 08:35 AM    GLU 153 (H) 01/27/2024 11:09 AM    GLU 139 (H) 08/30/2015 08:35 AM    CA 9.8 01/27/2024 11:09 AM    CA 10.0 08/30/2015 08:35 AM    MG 2.0 06/14/2022 05:24 AM    MG 1.7 01/03/2014 10:11 AM    PO4 3.2 12/10/2021 01:43 PM        Assessment/Plan:  Paula Daugherty is a 78yr old female who presents to allergy/immunology clinic for evaluation of chronic, recurrent urticaria.  The prolonged duration of her urticarial rash (well over 6 weeks) and its transient duration and occurrence without clear provocation is most concerning for chronic spontaneous urticaria rather than chronic inducible urticaria.  Although she has a remote history of eczema, the description of her present rash and photos of it are not consistent with her prior eczema symptoms-- thus it is unlikely that she is experiencing eczema flares. I have lower suspicion for erythema multiforme (EM) given that her symptoms were not associated with any prior infections and that she does not experience painful skin lesions or target-like lesions that I'd expect with EM. Also lower concern for urticarial vasculitis (UV) given the short duration of her urticarial episodes and that she denies any burning like sensation with her rashes. Absence of residual spots/ ecchymosis like lesions post urticarial episodes is also less consistent with UV.     Her urticarial rashes are not accompanied by worrisome symptoms such as angioedema, sob/ wheezing, which is reassuring. No further workup or confirmatory testing is needed. Most cases of chronic spontaneous urticaria have a benign course. Overall, she seems to be doing well. Given that these urticarial episodes do not disrupt her quality of life/day-day activities over the past year and that her skin exam was unremarkable for any lingering or debilitating rashes, I anticipate that her course to be benign. We advised conservative management, switching her oral antihistamine to cetirizine for more targeted effects on the skin, and to continue the topical steroids that help her symptoms.     Diagnoses and all orders for this visit:  Urticaria  - Stop Claritin (loratadine).  - Start cetirizine 10 mg once daily. If hives continue, take an additional 5 mg dose as needed.   - Avoid NSAIDs and alcohol  - Advised to avoid itching hands/ rash  - May trial ice packs to hands for relief during itchiness  - follow up in 3 months with Dr. Jefm     Thank you for allowing us  to participate in the care of your patient. If there are any questions or concerns, please do not hesitate to contact us .      Lavanda Kells,  MD  Internal Medicine, PGY-1  [facility]         [1]   Current Outpatient Medications   Medication Sig Dispense Refill    acetaminophen  (TylenoL ) 325 mg tablet Take 1 tablet by mouth if needed for pain (joint pain).      albuterol  (Proair  HFA, Proventil  HFA, Ventolin  HFA) 90 mcg/actuation inhaler Take 2-4 puffs by inhalation every 4 to 6 hours if needed for wheezing. 8.5 g 2    Amlodipine  (NORVASC) 10 mg Tablet Take 1 tablet by mouth every day.  90 tablet 3    Atorvastatin  (LIPITOR) 20 mg Tablet Take 1 tablet by mouth every day at bedtime. 90 tablet 3    Blood Sugar Diagnostic (ONETOUCH ULTRA TEST) Strips Use to test blood glucose 2x/day 100 strip 11    CloNIDine  (CATAPRES ) 0.2 mg Tablet Take 1 tablet by mouth 2 times daily. Indications: change of life signs 180 tablet 3    Clopidogrel  (PLAVIX ) 75 mg Tablet Take 1 tablet by mouth every morning. 90 tablet 3    Diclofenac  (VOLTAREN ARTHRITIS PAIN) 1 % Gel Apply to the affected area 4 times daily. 100 g 1    insulin  glargine (Lantus  Solostar U-100 Insulin ) 100 unit/mL (3 mL) pen Inject 15 Units subcutaneously every day. Please provide patient with a 90 day supply 15 mL 3    Losartan  (COZAAR ) 100 mg tablet Take 1 tablet by mouth every day. 90 tablet 3    nystatin  (Klayesta ) 100,000 unit/gram powder APPLY A THIN LAYER OF POWDER TO THE AFFECTED AREA FOUR TIMES DAILY AS NEEDED 60 g 0    Ondansetron  (ZOFRAN -ODT) 8 mg disintegrating tablet Take 1 tablet by mouth every 8 hours if needed. 90 tablet 2    pen needle, diabetic (BD Ultra-Fine III Short Pen Needle) 31 gauge x 5/16 Use for injecting insulin  1 times per day 50 each 11     No current facility-administered medications for this visit.

## 2024-02-29 NOTE — Nursing Note (Signed)
 Paula Daugherty has been identified by name, and date of birth.  [name] is here with Spouse DJ  Vital signs taken, allergies verified, screened for pain and verified pharmacy   Patient was NOT wearing a surgical mask  Contact and Droplet precautions were followed when caring for the patient.   PPE used by provider during encounter: Surgical mask  Aloysius Sheen, [place]

## 2024-02-29 NOTE — Telephone Encounter (Signed)
 See 02/26/24 Patient Message encounter.

## 2024-03-02 ENCOUNTER — Encounter: Payer: Self-pay | Admitting: "Endocrinology

## 2024-03-02 DIAGNOSIS — E1129 Type 2 diabetes mellitus with other diabetic kidney complication: Secondary | ICD-10-CM

## 2024-03-02 NOTE — Telephone Encounter (Signed)
 SUBJECTIVE/OBJECTIVE:  Outgoing call to Paula Daugherty, a 78yr old female, about hyperglycemia. Patient reports increased thirst, hunger, and irritability; denies nausea or symptoms of DKA. Please see attached blood sugar logs.    Current medications reviewed. Insulin  Glargine. Patient has self-titrated to 35 units every day.    Allergies reviewed yes: Sulfa (sulfonamide antibiotics), Contrast dye [radiopaque agent], Hydrochlorothiazide, Januvia [sitagliptin], Keflex  [cephalexin ], Lyrica  [pregabalin ], Omnipaque  [iohexol ], Prozac [fluoxetine hcl], and Topiramate    IMPRESSION: Patient alert and conversant. Able to describe in detail current situation       PLAN:  Consult with MD    The patient does indicate she understands, agrees, and feels comfortable with the plan. Advised to call back directly if there are further questions, or if these symptoms fail to improve as anticipated or worsen.      Clotilda Kays, RN

## 2024-03-03 ENCOUNTER — Encounter: Payer: Self-pay | Admitting: Student in an Organized Health Care Education/Training Program

## 2024-03-03 MED ORDER — LANTUS SOLOSTAR U-100 INSULIN 100 UNIT/ML (3 ML) SUBCUTANEOUS PEN: PEN_INJECTOR | SUBCUTANEOUS | 3 refills | Status: AC

## 2024-03-03 NOTE — Telephone Encounter (Signed)
 I called patient to check in and follow up.   She reports that she has consistently been taking Lantus  35 units QHS and that her glucose has been consistently running high (up to the 300s) in the mornings. She has opened a new batch of insulin  pens but the high numbers have persisted.   She has been able to pick up Ozempic from the pharmacy but has not started this medication yet.     We discussed the following:  - Increase Lantus  to 38 units QHS  - Start Ozempic 0.25 mg weekly  - Follow up in 1 week with reports on tolerability of Ozempic and glucose numbers.     All questions were answered and the patient expressed agreement and understanding with the above plan.     Updated Lantus  prescription sent now.     Inri Sobieski, M.D.  Clinical Associate Professor  Department of Endocrinology

## 2024-03-03 NOTE — Telephone Encounter (Signed)
 Noted

## 2024-03-03 NOTE — Telephone Encounter (Signed)
 Forwarding to Provider to review and respond to patient.      Note to Provider: This MyChart message may be appropriate to be converted into an E-Visit encounter because it involves: Request to change Medication or Treatment plan (NOT for controlled substances)

## 2024-03-03 NOTE — Addendum Note (Signed)
 Addended by: Evalynne Locurto on: 03/03/2024 04:43 PM     Modules accepted: Orders

## 2024-03-04 ENCOUNTER — Ambulatory Visit

## 2024-03-04 DIAGNOSIS — R053 Chronic cough: Secondary | ICD-10-CM

## 2024-03-04 DIAGNOSIS — R0602 Shortness of breath: Secondary | ICD-10-CM

## 2024-03-04 LAB — PULMONARY FUNCTION TEST COMPLETE  (PFT) - PULMONARY LAB
FEF 25/75% (PRE): 2 L/s
FEF2575PRED: 1.53
FEV1 (PRE) % PRED: 95.1 %
FEV1 (PRE): 1.82 L
FEV1/FVC (PRE) % PRED: 101.7 %
FEV1/FVC (PRE): 79.82 %
FEV1PRED: 1.91
FVC (PRE) % PRED: 92 %
FVC (PRE): 2.28 L
FVCPRED: 2.48

## 2024-03-04 NOTE — Progress Notes (Signed)
 PFT.     Patient attempted PFT testing however she terminated testing due to difficulty performing maneuvers and getting a headache. She gave excellent effort but only one usable and acceptable Spiro was recorded and 2 SVC attempts.

## 2024-03-07 ENCOUNTER — Ambulatory Visit
Admission: RE | Admit: 2024-03-07 | Discharge: 2024-03-07 | Disposition: A | Discharge status: Home / Self Care | Source: Ambulatory Visit | Attending: GASTROENTEROLOGY | Admitting: GASTROENTEROLOGY

## 2024-03-07 ENCOUNTER — Ambulatory Visit: Admitting: ALLERGY & IMMUNOLOGY

## 2024-03-07 ENCOUNTER — Ambulatory Visit: Payer: Self-pay | Admitting: GASTROENTEROLOGY

## 2024-03-07 DIAGNOSIS — Z1159 Encounter for screening for other viral diseases: Secondary | ICD-10-CM

## 2024-03-07 DIAGNOSIS — K7460 Unspecified cirrhosis of liver: Secondary | ICD-10-CM

## 2024-03-09 ENCOUNTER — Encounter

## 2024-03-10 ENCOUNTER — Ambulatory Visit

## 2024-03-10 DIAGNOSIS — F332 Major depressive disorder, recurrent severe without psychotic features: Secondary | ICD-10-CM

## 2024-03-10 NOTE — Progress Notes (Signed)
 [name], If you are reviewing this progress note and have questions about the meaning or clinical terms being used, please feel free to bring it up when we talk next.  Medical notes are meant to be a communication tool between professional providers and require medical terms to be used for efficiency. You can also look up terms via on-line medical dictionaries such as Medline Plus at:  achegone.com    Medical Record Number:  [id number]   Patient:  Paula Daugherty   Date of Birth:  [birth year]   Date of Visit:  03/10/2024     PPE USED:  Patient was wearing a surgical mask.no  Provider was wearing a surgical mask and face shield: no      Type of Visit: Individual Psychotherapy  Time: 45 min.   CPT Code: 09165 - Psychotherapy, (38-52 mins) 45 minutes with patient and/or family member.    Identifying Information/ Chief Complaint:     Paula Daugherty is a 78yr old female presenting for anxiety and depression symptoms.    Objective/Mental Status Exam:      Appearance: appropriate  Kinetics & gait: Normal gait, no apparent psychomotor retardation or agitation.   Demeanor: cooperative  Speech: Coherent, clear, appropriate volume, normal rhythm and flow  Language: Normal word choice, vocabulary adequate for communication.  Mood/Affect: Worried, Frustrated, and Fearful mood with congruent to mood and appropriate to content affect.   Suicidal/Homicidal/Assaultive Thoughts/Intent/Plans:  Denies current SI/HI, intent, mean and plan.   Thought Process: Linear & goal directed  Thought Content: appropriate, no observed evidence of illogical thought.  Orientation:  Alert and Oriented  Attention: normal and follows conversation thread  Memory: No short or long-term memory deficits evident.   Judgment:  Good  Insight: Good    Assessment:     -This is patient's 3rd session since intake appointment. The Patient is demonstrating higher scores on screening tools, and subjectively reports concerns for her  daughter have increased stress.     -Patient is progressing towards goals as evidenced by continued participation in the therapeutic process and overall high investment in achieving therapeutic goals.    Safety    Pt's acute risk for suicide is low. Pt presents without current suicidal ideation, intent, plan, nor preparatory behavior.     Pt demonstrates no imminent risk of danger to others given no H/I reported today, no aggressive behavior, and no known h/o violence.      Plan to continue to assess and a higher level of care is not indicated at this time.    Medication questions:  Medications are seen as effective. and no mental health medications at this time.    Diagnosis:    No new or revised diagnoses are needed at this time.     Validated Behavioral Health Measures:     PHQ-9:       03/10/2024    10:20 AM   PHQ-9   Interest 3   Feeling 2   Sleeping 2   Tired 3   Appetite 2   Feeling Bad 1   Concentrating 1   Moving and Speaking 0   Suicidal Thoughts 0   Problems Somewhat difficult   PHQ-9 Total 14   PHQ-9 Affective Total 7   PHQ-9 Physical Total 7   PHQ-9 Cognitive Total 1       GAD-7:       03/10/2024    10:23 AM   GAD7 Results   Feeling Nervous, Anxious, or on Edge 2  Not Being Able to Stop or Control Worrying 3   Worrying too Much About Different Things 2   Trouble Relaxing 2   Being so Restless That it is Hard to Sit Still 0   Becoming Easily Annoyed or Irritable 3   Feeling Afraid as if Something Awful Might Happen 2   GAD-7 Total Score 14   If you checked off any problems, how difficult have these problems made it for you to do your work, take care of things at home, or get along with other people? Very difficult        Psychotherapy Content:     Paula Daugherty reports her physical symptoms related to 3 trips to the emergency room with a recent admission with hope she will be released today.  The patient reports interactions with her daughter and son-in-law lead to concerns regarding her care and low weight and  infections leading to being in the hospital.  The patient was able to engage in reality testing with competing thoughts.  The patient notes continued attempts to encourage positive behavior in self-care.  Patient states that she is engaging in releasing concerns with a trusted friend as well as holding her son when recognizing statements like be unhelpful.    Interventions  Clinician reviewed homework regarding review of feelings, thoughts and reactions to emotions being written down as helpful.  Normalized feelings of concern for her daughter and questions leading to strong responses.  Introduced strategy of responding with pausing reactions with mindfulness to decrease negative stress for self and others.    Response to treatment  Paula Daugherty was responsive to practicing mindfulness and emotional regulation skills and felt they were helping.  Paula Daugherty expressed feelings when prompted to use coping strategies.  Paula Daugherty willing to practice mood diary with increased benefit of emotional awareness.  Paula Daugherty willing to practice reframing thought patterns and evaluate behavior rather than engage in negative talk. Paula Daugherty will continue practice CBT skills.     Therapeutic Modalities Used:      -Clinician utilized supportive psychotherapy interventions including empathic listening, validation, reflection, and problem solving.  -Clinician used motivational interviewing/Socratic questioning during the session.  -Clinician provided brief psychoeducation about CBT techniques commonly used to address depression/anxiety in therapy.  -Clinician utilized a strength-based perspective to assist patient in exploring skills and strengths to be used in overcoming current difficulties.    Plan:      -Patient may benefit from continued engagement in talk therapy and to facilitate continued adjustments; continued socialization and behavioral activation and mindfulness will be pursued.    -Continue current therapeutic focus with weekly to bi-weekly  individual psychotherapy sessions.  -Next scheduled session: 03-24-24    Resources: Automatic thought Record and Window of Tolerance Handout  Homework:Review and use grounding technique 1-3 times in the next week to improve return to sleep.  Practice mindfulness techniques 3 times a week to decrease stimulus response. and Engage in physical activity 1-3 times a week for mood improvement.  Identify moments of conflict and use of passive, aggressive or assertive coping skills to attempt to solve problems. and Make intentional time to identify issues of concern and create a heartfelt and clear message regarding needs or concerns.    Goals:  - Follow your Doctor's Guidelines to stay physically active. Exercise is good for your body, mental health and sleep. and - Consider a checklist or journal of daily activities as you get used to a new routine. Remember to provide yourself with positive feedback and PRAISE  rather than criticism!    In [place] Chyrl Milta KEN  License No [id number]  Valid until: 07/02/2024  Behavioral Health Clinician  Collaborative Care Program  [facility]      The patient understands and agrees with the plan of care outlined.    Have questions about billing or payments?  - Call [phone]; available Monday-Friday, 8:30am to 4pm  - Toll free: [phone]  - TDD (hearing impaired): [phone]  - Email: [email or web address]

## 2024-03-11 ENCOUNTER — Encounter: Payer: Self-pay | Admitting: "Endocrinology

## 2024-03-14 ENCOUNTER — Ambulatory Visit: Admitting: PULMONARY DISEASE

## 2024-03-14 ENCOUNTER — Encounter: Payer: Self-pay | Admitting: PULMONARY DISEASE

## 2024-03-14 VITALS — BP 123/74 | HR 59 | Temp 97.7°F | Ht 62.0 in | Wt 166.0 lb

## 2024-03-14 DIAGNOSIS — R0602 Shortness of breath: Secondary | ICD-10-CM

## 2024-03-14 DIAGNOSIS — R053 Chronic cough: Secondary | ICD-10-CM

## 2024-03-14 NOTE — Patient Instructions (Signed)
 Instructions:    Continue Fluticasone  50 g 1 spray each nostril twice a day.   Continue cetirizine 10 mg or Loratadine 10 mg one tablet by mouth daily at bedtime.  Identify and avoid known triggers.  We will follow up in 6 months or sooner if needed.    Best wishes,  Dr. Denis Koppel

## 2024-03-14 NOTE — Nursing Note (Signed)
 Patient verified with two identifiers.  Vitals obtained for visit, including BP, Pulse/Ox, Temp, Wt/Ht  Verified Allergies, Tobacco Use Status, and Preferred Pharmacy  Pain assessed

## 2024-03-14 NOTE — Progress Notes (Signed)
 Chief Complaint: Please see HPI.    History of Present Illness:    The patient is a very pleasant 68yr female with significant PMH of chronic cough since early this year, shortness for breath and wheezing, who is an established patient of the pulmonary clinic, who returns today for routine follow-up. The patient's Covid-19 questionnaire/assessment is negative.  She is accompanied by her husband who corroborates with her history.    Since her last visit, she has noticed significant improvement in her chronic cough with upper airway therapies.  She has no symptoms or signs suggestive of progressive bronchitis or bronchiectasis.  She has no current fevers, chills, night sweats or signs of lower respiratory tract infection.  She has been able to tolerate her intranasal fluticasone , antihistamine therapy.  She has recently been advised by her allergist to change from loratadine to cetirizine due to her skin allergies.  She has no other aggravating or relieving factors to her pulmonary symptoms.    As per my previous note dated 02/02/2024:     The patient states that she was in her usual health until late spring or early summer this year when she developed an insidious onset cough.  She does not recollect precipitating events such as upper respiratory illness or pneumonia.  Since then, her cough has been persistent.  She describes her cough to be present throughout the day, occasionally at nighttime waking her up in the middle of the night, mostly nonproductive and with no known specific identifiable triggers.  She does have occasional partial relief with sips of water  but does feel symptomatic improvement when she uses her albuterol  inhaler.  She has no associated phlegm production, fevers, chills, night sweats, unexplained weight loss, palpable lymphadenopathy, orthopnea or PND.  She denies dysphagia, choking or aspiration symptoms.  Her weight has remained stable in the past many months.  She has no other aggravating or  relieving factors to her symptoms.    The patient denies any fevers, chills, rigors, night sweats, unexplained weight loss, lymphadenopathy, chest pain, nausea, diaphoresis, syncope/presyncopal symptoms.     The patient denies any previous history of COPD, asthma, chronic bronchitis, bronchiectasis, frequent pneumonias or other chronic pulmonary problems.    Review of systems:    A pertinent review of systems was performed and was negative except as in HPI.    Past Medical History[1]    Past Surgical History[2]    Allergies:  Sulfa (sulfonamide antibiotics), Contrast dye [radiopaque agent], Hydrochlorothiazide, Januvia [sitagliptin], Keflex  [cephalexin ], Lyrica  [pregabalin ], Omnipaque  [iohexol ], Prozac [fluoxetine hcl], and Topiramate    Medications Ordered Prior to Encounter[3]    Occupational and exposure history:    Reviewed and unchanged on 03/14/2024.  Occupation/employment:  Retired  Recent travel outside the [place] : no  Exposure to TB: no  Positive PPD: no  Pets: no  Exposure to asbestos, silica, other inhalants: no  History of DVT or PE: no  Tobacco use:  Very remote exposure to tobacco smoke    Family History:    Reviewed and unchanged on 03/14/2024    No significant pulmonary medical problems in immediate family members.    Physical Examination:    GENERAL: Alert and oriented to time, place and person without distress, able to speak clearly and in full sentences.     HEENT: PERLA, EOMI, moist mucous membranes,  no sinus tenderness on palpation,   NECK: Symmetric without notable rashes or rigidity.   LUNGS: Normal respiratory rate and effort. No chest deformities. Good air entry bilaterally.  No adventitious sounds.   CARDIAC: S1 S2 heard  GI: soft abdomen, no tenderness to palpation, no peritoneal signs  GU: No CVA tenderness, rest of the exam deferred by the patient  Extremities: Moves arms and hands freely, no visible cyanosis, peripheral pulses palpable,  no clubbing,  no pitting edema  SKIN: No visible  rashes.   Neuro: Alert and oriented x 3, linear thoughts with sound judgement and insight, normal mood and affect. Strength 5/5 in all extremities    Data reviewed:    I have personally and independently reviewed and interpreted the tests noted below on 03/14/2024. I have updated the patient with the results of these tests.    Labs:    Lab Visit on 01/27/2024   Component Date Value    Sodium 01/27/2024 141     Potassium 01/27/2024 4.9     Chloride 01/27/2024 103     Carbon Dioxide Total 01/27/2024 23     Anion Gap 01/27/2024 15     Urea Nitrogen, Blood (BU* 01/27/2024 23 (H)     Creatinine Serum 01/27/2024 1.34 (H)     Glucose 01/27/2024 153 (H)     Calcium  01/27/2024 9.8     Protein 01/27/2024 7.9     Albumin 01/27/2024 4.2     Alkaline Phosphatase (AL* 01/27/2024 122     Aspartate Transaminase (* 01/27/2024 36     Bilirubin Total 01/27/2024 0.8     Alanine Transferase (ALT) 01/27/2024 33     E-GFR Creatinine (Female) 01/27/2024 41    Lab Visit on 01/18/2024   Component Date Value    Cholesterol 01/18/2024 142     HDL Cholesterol 01/18/2024 59     LDL Cholesterol Calculat* 90/84/7974      Total Cholesterol: HDL R* 01/18/2024 2.4     Triglyceride Level 01/18/2024 162 (H)     Non-HDL Cholesterol 01/18/2024 83     Sodium 01/18/2024 141     Potassium 01/18/2024 4.5     Chloride 01/18/2024 104     Carbon Dioxide Total 01/18/2024 24     Anion Gap 01/18/2024 13     Urea Nitrogen, Blood (BU* 01/18/2024 20     Creatinine Serum 01/18/2024 1.27 (H)     Glucose 01/18/2024 190 (H)     Calcium  01/18/2024 9.4     Protein 01/18/2024 8.0     Albumin 01/18/2024 4.2     Alkaline Phosphatase (AL* 01/18/2024 158 (H)     Aspartate Transaminase (* 01/18/2024 51 (H)     Bilirubin Total 01/18/2024 0.7     Alanine Transferase (ALT) 01/18/2024 61 (H)     E-GFR Creatinine (Female) 01/18/2024 44     Thyroid  Stimulating Horm* 01/18/2024 1.31     Hgb A1C 01/18/2024 7.5 (H)     Hgb A1C,Glucose Est Avg 01/18/2024 169     Vitamin B12 01/18/2024 204  (L)     Methylmalonic Acid Level 01/18/2024 1.55 (H)     Vitamin B1(Thiamine ) Who* 01/18/2024 89     Folate 01/18/2024 9.1    Lab Visit on 01/11/2024   Component Date Value    Ferritin 01/11/2024 14     White Blood Cell Count 01/11/2024 8.1     Red Blood Cell Count 01/11/2024 4.49     Hemoglobin 01/11/2024 11.5 (L)     Hematocrit 01/11/2024 36.3     MCV 01/11/2024 80.8     MCH 01/11/2024 25.6 (L)     MCHC 01/11/2024 31.7 (L)  RDW 01/11/2024 16.5 (H)     MPV 01/11/2024 8.1     Platelet Count 01/11/2024 252     Neutrophils % Auto 01/11/2024 70.6     Lymphocytes % Auto 01/11/2024 18.6     Monocytes % Auto 01/11/2024 8.7     Eosinophils % Auto 01/11/2024 1.5     Basophils % Auto 01/11/2024 0.6     Neutrophils Abs Auto 01/11/2024 5.7     Lymphocytes Abs Auto 01/11/2024 1.5     Monocytes Abs Auto 01/11/2024 0.7     Eosinophils Abs Auto 01/11/2024 0.1     Basophils Abs Auto 01/11/2024 0.0     Sodium 01/11/2024 138     Potassium 01/11/2024 5.0     Chloride 01/11/2024 100     Carbon Dioxide Total 01/11/2024 23     Anion Gap 01/11/2024 15     Urea Nitrogen, Blood (BU* 01/11/2024 24 (H)     Creatinine Serum 01/11/2024 1.37 (H)     Glucose 01/11/2024 160 (H)     Calcium  01/11/2024 9.8     Protein 01/11/2024 8.9 (H)     Albumin 01/11/2024 4.7     Alkaline Phosphatase (AL* 01/11/2024 152 (H)     Aspartate Transaminase (* 01/11/2024 40     Bilirubin Total 01/11/2024 1.0     Alanine Transferase (ALT) 01/11/2024 43 (H)     E-GFR Creatinine (Female) 01/11/2024 40        Imaging:    CXR was performed on 01/11/2024. I have reviewed the images and the report on 02/02/2024.      FINDINGS:  Heart size normal, aorta tortuous and calcified. Lungs are clear. No  evidence of pleural, pneumothorax or pneumoperitoneum.     Unremarkable upper abdomen     IMPRESSION: 1. No acute cardiopulmonary findings     Final Report Electronically Signed By: Prentice Bring on 01/11/2024 11:37 AM    Procedures:    PFTs were performed on 03/04/2024.  I have  reviewed the data and the report on 03/14/2024.    Impression: Data may be compromised. The patient was unable to complete testing.   Normal spirometry. Clinical correlation is advised.   Lynelle Spanish MD Associate Physician     Echocardiogram was performed on 11/28/2022. I have reviewed the report on 02/02/2024.    SUMMARY:    1. Left ventricular ejection fraction, by Simpson's Biplane, is normal at 64 %.    2. There is no evidence of pericardial effusion.    3. Left ventricular diastolic function is indeterminate.    4. Mild concentric left ventricular hypertrophy.    5. Normal right ventricular size, wall thickness, and systolic function.    6. The left atrium is normal in size and structure.    7. The right atrium is normal in size and structure.    8. The tricuspid regurgitation jet is inadequate to estimate RVSP.          ICD-10-CM    1. Chronic cough  R05.3       2. Shortness of breath  R06.02         Assessment and plan:    (R05.3) Chronic cough  (primary encounter diagnosis)    The patient's chronic cough is likely due to multifactorial etiologies including upper airway causes, GERD, cough variant asthma. There is no evidence of lower respiratory tract bacterial infection that requires antibiotic therapy.     Her symptoms are consistent with chronic sinus drainage related upper airway cough  with associated inflammatory chronic sinusitis.  She has noticed symptomatic improvement with current therapies.    We have advised the patient to:     Continue Fluticasone  50 g 1 spray each nostril twice a day. We have discussed the technique to use this medication. I have also discussed the potential side effects and the patient will discontinue the medication if there are such side effects.  Continue cetirizine 10 mg or Loratadine 10 mg one tablet by mouth daily at bedtime.  Identify and avoid known triggers.    (R06.02) Shortness of breath    The patient also has a occasional shortness for breath limiting her  activities.  Her PFTs were suggestive of normal spirometry.  She does not have any further symptoms or signs suggestive of progressive obstructive or restrictive lung disease.  No other aggressive interventions or investigations are indicated at this time.    The patient understands and is agreeable with the above plan of management. The patient has been advised to follow up in 6 months, or sooner if needed, for a clinic/virtual visit. The patient is encouraged to call the pulmonary clinic if there are any questions or problems.      I have reconciled the patient's pulmonary medications today.  I will defer the management of the patient's comorbidities to the PCP and other consultants.  The patient will discuss the vaccination status with the PCP.  Thank you for allowing me to participate in the care of this patient. Please do not hesitate to contact me if any questions or concerns arise.     Lynelle Spanish MD    Associate Physician  IM: Pulmonary   [facility]    This note was dictated using voice recognition software and may contain errors in transcription that have inadvertently escaped proof reading. Please contact me for any questions or uncertainty in the information provided.          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   [2]   Past Surgical History:  Procedure Laterality Date    BIOFEEDBACK PERI/URO/RECTAL  10/21/2017    session #1: N Score = 5.4    BIOFEEDBACK PERI/URO/RECTAL   11/04/2017    session #2    BIOFEEDBACK PERI/URO/RECTAL  11/18/2017    Session #3    BIOFEEDBACK PERI/URO/RECTAL  12/02/2017    Session #4    BIOFEEDBACK PERI/URO/RECTAL  12/30/2017    Session #5    BIOFEEDBACK PERI/URO/RECTAL  03/23/2018    Session #6    CAPSULE ENDOSCOPY  01/27/2018    CAPSULE ENDOSCOPY  01/29/2018         CAPSULE ENDOSCOPY  12/23/2021    wnl    COLONOSCOPY  01/04/2014    polyp--TA  and erosion; hemorrhoids; tics; repeat x 3 yrs    COLONOSCOPY  12/16/2017    TA; repeat in 3 yrs    COLONOSCOPY  04/05/2021    polyps--path pending; hemorrhoids    EGD  04/2020    gastritis    EGD  04/08/2019    biopsy path--pending    EGD  04/05/2021    biopsies at tattoo--path pending    [facility] BIOFEEDBACK PERI/URO/RECTAL  05/02/2018         HC COLONOSCOPY,REMV LESN,SNARE  12/16/2017    MAMMOPLASTY, REDUCTION  1985    MANOMETRY  09/02/2017    work on constipation; kegels and biofeedback; proceed w/colonoscopy     PHACOEMULSIFICATION, CATARACT Right 02/12/2015    with IOL    PR ARTHROSCOPY KNEE DIAGNOSTIC W/WO SYNOVIAL BX SPX  1980s    Knee arthroscopy    PR ESOPHAGOGASTRODUODENOSCOPY TRANSORAL DIAGNOSTIC  02/12/2016    EGD--polyp--path pending; antral gastritis; no varices     PR ESOPHAGOGASTRODUODENOSCOPY TRANSORAL DIAGNOSTIC  06/11/2016    EGD--biopsy at site of carcinoid tumor path pending     PR ESOPHAGOGASTRODUODENOSCOPY TRANSORAL DIAGNOSTIC      EGD--folds in duodenum--path pending     PR ESOPHAGOGASTRODUODENOSCOPY TRANSORAL DIAGNOSTIC  04/2017    EGD; repeat EGD in 1 year     PR ESOPHAGOGASTRODUODENOSCOPY TRANSORAL DIAGNOSTIC  04/20/2018    gastritis; nodule--path pending    PR LIG/TRNSXJ FLP TUBE ABDL/VAG APPR UNI/BI      Tubal ligation    PR TOTAL ABDOMINAL HYSTERECT W/WO RMVL TUBE OVARY  1980s    partial; no BSO    PR TYMPANIC MEMB RPR W/WO PREPJ PERFOR PATCH      Tympanoplasty    REPAIR, BLEPHAROPTOSIS Bilateral 08/06/2015    Blepharoplasty bilateral upper lids   [3]   Current Outpatient Medications on File  Prior to Visit   Medication Sig Dispense Refill    acetaminophen  (TylenoL ) 325 mg tablet Take 1 tablet by mouth if needed for pain (joint pain).      albuterol  (Proair  HFA, Proventil  HFA, Ventolin  HFA) 90 mcg/actuation inhaler Take 2-4 puffs by inhalation every 4 to 6 hours if needed for wheezing. 8.5 g 2    Amlodipine  (NORVASC) 10 mg Tablet Take 1 tablet by mouth every day. 90 tablet 3    Atorvastatin  (LIPITOR) 20 mg Tablet Take 1 tablet by mouth every day at bedtime. 90 tablet 3    Blood Sugar Diagnostic (ONETOUCH ULTRA TEST) Strips Use to test blood glucose 2x/day 100 strip 11    CloNIDine  (CATAPRES ) 0.2 mg Tablet Take 1 tablet by mouth 2 times daily. Indications: change of life signs 180 tablet 3    Clopidogrel  (PLAVIX ) 75 mg Tablet Take 1 tablet by mouth every morning. 90 tablet 3    Diclofenac  (VOLTAREN ARTHRITIS PAIN) 1 % Gel Apply to the affected area 4 times daily. 100 g 1    insulin  glargine (Lantus  Solostar U-100 Insulin ) 100 unit/mL (3 mL) pen Use daily up to 40 units per day. Please provide patient with a 90 day supply 45 mL 3    Losartan  (COZAAR ) 100 mg tablet Take 1 tablet by mouth every day. 90 tablet 3    nystatin  (Klayesta ) 100,000 unit/gram powder APPLY A THIN LAYER OF POWDER TO THE AFFECTED AREA FOUR TIMES DAILY AS NEEDED 60 g 0    Ondansetron  (ZOFRAN -ODT) 8 mg disintegrating tablet Take 1 tablet by mouth every 8 hours if needed. 90 tablet 2    pen needle, diabetic (BD Ultra-Fine  III Short Pen Needle) 31 gauge x 5/16 Use for injecting insulin  1 times per day 50 each 11    semaglutide (Ozempic) 0.25 mg or 0.5 mg (2 mg/3 mL) pen injector Inject 0.25 mg subcutaneously one time each week. After 4 weeks, increase to 0.5 mg weekly 3 mL 2     No current facility-administered medications on file prior to visit.

## 2024-03-16 ENCOUNTER — Encounter: Payer: Self-pay | Admitting: Student in an Organized Health Care Education/Training Program

## 2024-03-16 ENCOUNTER — Ambulatory Visit: Admitting: Student in an Organized Health Care Education/Training Program

## 2024-03-16 VITALS — BP 128/62 | HR 57 | Temp 97.3°F | Ht 62.0 in | Wt 164.9 lb

## 2024-03-16 DIAGNOSIS — E1129 Type 2 diabetes mellitus with other diabetic kidney complication: Secondary | ICD-10-CM

## 2024-03-16 DIAGNOSIS — Z23 Encounter for immunization: Secondary | ICD-10-CM

## 2024-03-16 DIAGNOSIS — Z794 Long term (current) use of insulin: Secondary | ICD-10-CM

## 2024-03-16 DIAGNOSIS — L509 Urticaria, unspecified: Secondary | ICD-10-CM

## 2024-03-16 DIAGNOSIS — N1831 Long term (current) use of insulin: Secondary | ICD-10-CM | POA: Insufficient documentation

## 2024-03-16 MED ORDER — OZEMPIC 1 MG/DOSE (4 MG/3 ML) SUBCUTANEOUS PEN INJECTOR: 1.0000 mg | PEN_INJECTOR | SUBCUTANEOUS | 3 refills | Status: DC

## 2024-03-16 MED ORDER — OZEMPIC 0.25 MG OR 0.5 MG (2 MG/3 ML) SUBCUTANEOUS PEN INJECTOR: 0.5000 mg | PEN_INJECTOR | SUBCUTANEOUS | 0 refills | Status: DC

## 2024-03-16 NOTE — Nursing Note (Signed)
 The Inactivated Influenza Vaccine VIS document for the Influenza Vaccine was given to the patient/caregiver or legal representative to review. All questions were answered or referred to the physician.    The patient has no contraindications to receive the Influenza Vaccine.The Influenza Vaccine was administered per Policy 504.         Nekita Pita, MA

## 2024-03-16 NOTE — Nursing Note (Signed)
 Patient's chief complaint noted.  I have verified and updated the following:  Allergy   Tobacco   Preferred pharmacy  PHQ-2/PHQ9       Health Maintenance(Care Gaps) reviewed with patient     Patient is not due for Advanced Care Planning  - Place ACP documents on counter with green rooming sheet if due.  Provider will have the conversation    Patient is not due for A1C  - Complete POC A1C in clinic if due.  If patient is going to lab TODAY, okay to decline POC. Document reason if patient declines.      Patient is not due for Repeat Blood Pressure Check Screen - Recheck BP using a manual cuff after 5 minutes.  Document updated BP in EPIC under vitals.      Patient is not due for Colorectal Cancer Screen - Inform Provider if CRC screen is due and offer to pend FIT kit/Cologuard or if referral to Gastroenterology for Colonoscopy is appropriate.    Patient is not due for Lung Cancer Screen - Inform Provider if Lung Cancer screen is due.     Vital signs documented below.    Paula Grime, MA    Temp: 36.3 C (97.3 F) (11/12 1306)  Temp src: Temporal (11/12 1306)  Pulse: 57 (11/12 1306)  BP: 128/62 (11/12 1306)  Resp: --  SpO2: 100 % (11/12 1306)  Height: 157.5 cm (5' 2) (11/12 1306)  Weight: 74.8 kg (164 lb 14.5 oz) (11/12 1306)

## 2024-03-17 ENCOUNTER — Ambulatory Visit: Admitting: Family Medicine

## 2024-03-17 NOTE — Progress Notes (Signed)
 [facility] [place]  Family Medicine     Chief Complaint   Patient presents with    Follow-up         ASSESSMENT and PLAN:    1. Type 2 diabetes mellitus with other diabetic kidney complication, with long-term current use of insulin  (HCC)    2. Urticaria         Assessment & Plan  Type 2 diabetes mellitus with diabetic kidney complication  Blood glucose levels elevated despite insulin . GLP-1 therapy initiated, no significant improvement yet. Insulin  increased to 44 units nightly. GLP-1 expected to reduce insulin  dependency and improve pancreatic function over time.  - Continue GLP-1 receptor agonist therapy, increase to 0.5 mg, then to 1 mg.  - Continue insulin  glargine at 44 units nightly.  - Monitor blood glucose levels regularly.  - Educated on dietary modifications to reduce carbohydrate intake and avoid sweets.  - Discussed potential side effects of GLP-1 therapy and management strategies.    Urticaria  Intermittent urticaria with no specific allergen identified. Internal histamines suspected. Claritin prescribed, Zyrtec recommended but not started.  - Start Zyrtec at night, monitor for drowsiness.  - Use ice packs for itching and swelling.  - Follow up with allergist as needed.    Modified Medications    Modified Medication Previous Medication    SEMAGLUTIDE (OZEMPIC) 0.25 MG OR 0.5 MG (2 MG/3 ML) PEN INJECTOR semaglutide (Ozempic) 0.25 mg or 0.5 mg (2 mg/3 mL) pen injector       Inject 0.5 mg subcutaneously one time each week. After 4 weeks, increase to 0.5 mg weekly    Inject 0.25 mg subcutaneously one time each week. After 4 weeks, increase to 0.5 mg weekly        SUBJECTIVE:  Paula Daugherty is a 78yr old female  here for chief complaint noted above.    History of Present Illness  Paula Daugherty is a 78 year old female with diabetes who presents for follow-up on her liver and diabetes management.    She has recently started on a GLP-1 medication and has taken her second dose without adverse  symptoms. Her blood sugar levels remain high, necessitating an increase in her insulin  dosage to 44 units at night. She is working on managing nighttime snacking by avoiding pastries, ice cream, and cookies, and opting for healthier options like unsweetened fruit cocktail, nuts, and cheese. She is committed to reducing carbohydrate intake despite the challenges.    Her persistent cough has improved significantly, with only occasional heavy episodes. She has switched from albuterol  to another medication, which effectively manages her symptoms. She experiences intermittent rashes and welts, for which an allergist recommended Zyrtec. She has not started Zyrtec yet due to potential drowsiness and plans to take it when she can be at home.    ROS:  As noted in HPI. 10 point review of systems was otherwise negative.     Current Outpatient Medications   Medication Sig    acetaminophen  (TylenoL ) 325 mg tablet Take 1 tablet by mouth if needed for pain (joint pain).    albuterol  (Proair  HFA, Proventil  HFA, Ventolin  HFA) 90 mcg/actuation inhaler Take 2-4 puffs by inhalation every 4 to 6 hours if needed for wheezing.    Amlodipine  (NORVASC) 10 mg Tablet Take 1 tablet by mouth every day.    Atorvastatin  (LIPITOR) 20 mg Tablet Take 1 tablet by mouth every day at bedtime.    Blood Sugar Diagnostic (ONETOUCH ULTRA TEST) Strips Use to  test blood glucose 2x/day    CloNIDine  (CATAPRES ) 0.2 mg Tablet Take 1 tablet by mouth 2 times daily. Indications: change of life signs    Clopidogrel  (PLAVIX ) 75 mg Tablet Take 1 tablet by mouth every morning.    Diclofenac  (VOLTAREN ARTHRITIS PAIN) 1 % Gel Apply to the affected area 4 times daily.    insulin  glargine (Lantus  Solostar U-100 Insulin ) 100 unit/mL (3 mL) pen Use daily up to 40 units per day. Please provide patient with a 90 day supply    Losartan  (COZAAR ) 100 mg tablet Take 1 tablet by mouth every day.    nystatin  (Klayesta ) 100,000 unit/gram powder APPLY A THIN LAYER OF POWDER TO THE  AFFECTED AREA FOUR TIMES DAILY AS NEEDED    Ondansetron  (ZOFRAN -ODT) 8 mg disintegrating tablet Take 1 tablet by mouth every 8 hours if needed.    pen needle, diabetic (BD Ultra-Fine III Short Pen Needle) 31 gauge x 5/16 Use for injecting insulin  1 times per day    semaglutide (Ozempic) 0.25 mg or 0.5 mg (2 mg/3 mL) pen injector Inject 0.5 mg subcutaneously one time each week. After 4 weeks, increase to 0.5 mg weekly    semaglutide (Ozempic) 1 mg/dose (4 mg/3 mL) pen injector Inject 1 mg subcutaneously one time each week. Check flow before first injection with each new pen.     No current facility-administered medications for this visit.        OBJECTIVE:   BP 128/62 (SITE: right arm, Orthostatic Position: sitting, Cuff Size: regular)   Pulse 57   Temp 36.3 C (97.3 F) (Temporal)   Ht 1.575 m (5' 2)   Wt 74.8 kg (164 lb 14.5 oz)   LMP 05/06/1983   SpO2 100%   BMI 30.16 kg/m      Physical Exam        Gen: appears well, NAD  HEENT: EOMI, sclera clear, anicteric   CV: normal rate, pulses palpable   Chest: trachea midline, non labored breathing, speaking in full sentences   Extrem: no cyanosis or edema   Neuro: no focal deficits   Skin: no rashes or lesions   Psych: normal mood and affect       Results  DIAGNOSTIC  Pulmonary function test: Normal          03/16/2024     1:07 PM   PHQ-2   Interest 2   Feeling 1   PHQ-2 Total Score 3     No question data found.     Depression Treatment Plan:  see assessment/plan section of note      No follow-ups on file.    I did review patient's past medical and family/social history, no changes noted.  Barriers to Learning assessed: none. Patient verbalizes understanding of teaching and instructions.  Education Needs Identified?   no    Patient was counseled specifically on the following: diagnostic results, impressions, and/or recommended diagnostic studies reviewed, instructions for management and/or follow-up, risk factor reduction and patient and family education    I  spent 20 minutes with the patient, >50% of which were spent counseling her regarding differential, additional testing, if indicated, potential treatment options, and lifestyle modifications, if applicable    Today's visit has complexity inherent to evaluation and management associated with medical care services that serve as the continuing focal point for all needed health care services.    I obtained verbal consent from the patient to use AI ambient technology to transcribe the interactions between the patient  and myself during the clinical encounter.    Electronically signed by:  Logan Tawni Silvan, MD  Associate Physician  [facility]

## 2024-03-22 ENCOUNTER — Ambulatory Visit
Admission: RE | Admit: 2024-03-22 | Discharge: 2024-03-22 | Disposition: A | Discharge status: Home / Self Care | Source: Ambulatory Visit

## 2024-03-22 DIAGNOSIS — R0989 Other specified symptoms and signs involving the circulatory and respiratory systems: Secondary | ICD-10-CM | POA: Insufficient documentation

## 2024-03-24 ENCOUNTER — Ambulatory Visit

## 2024-03-24 DIAGNOSIS — F332 Major depressive disorder, recurrent severe without psychotic features: Secondary | ICD-10-CM

## 2024-03-24 NOTE — Progress Notes (Signed)
 Paula Daugherty, If you are reviewing this progress note and have questions about the meaning or clinical terms being used, please feel free to bring it up when we talk next.  Medical notes are meant to be a communication tool between professional providers and require medical terms to be used for efficiency. You can also look up terms via on-line medical dictionaries such as Medline Plus at:  achegone.com    Medical Record Number:  [id number]   Patient:  Paula Daugherty   Date of Birth:  Jan 11, 1946   Date of Visit:  03/24/2024     PPE USED:  Patient was wearing a surgical mask.no  Provider was wearing a surgical mask and face shield: no      Type of Visit: Individual Psychotherapy  Time: 45 min.   CPT Code: 09165 - Psychotherapy, (38-52 mins) 45 minutes with patient and/or family member.    Identifying Information/ Chief Complaint:     Paula Daugherty is a 78yr old female presenting for anxiety and depression symptoms.    Objective/Mental Status Exam:      Appearance: appropriate  Kinetics & gait: Normal gait, no apparent psychomotor retardation or agitation.   Demeanor: cooperative  Speech: Coherent, clear, appropriate volume, normal rhythm and flow  Language: Normal word choice, vocabulary adequate for communication.  Mood/Affect: Worried, Frustrated, Angry, and Sad mood with restricted affect.   Suicidal/Homicidal/Assaultive Thoughts/Intent/Plans:  Denies current SI/HI, intent, mean and plan.   Thought Process: Linear & goal directed  Thought Content: appropriate, no observed evidence of illogical thought.  Orientation:  Alert and Oriented  Attention: normal and follows conversation thread  Memory: No short or long-term memory deficits evident.   Judgment:  Good  Insight: Good    Assessment:     -This is patient's 4th session since intake appointment. The Patient is demonstrating lower scores on screening tools, and subjectively reports improved mood     -Patient is progressing  towards goals as evidenced by continued participation in the therapeutic process and overall high investment in achieving therapeutic goals.    Safety    Pt's acute risk for suicide is low. Pt presents without current suicidal ideation, intent, plan, nor preparatory behavior.     Pt demonstrates no imminent risk of danger to others given no H/I reported today, no aggressive behavior, and no known h/o violence.      Plan to continue to assess and a higher level of care is not indicated at this time.    Medication questions:  Declines medications;  and feels improvement with therapy and doesn't want meds.    Diagnosis:    No new or revised diagnoses are needed at this time.     Validated Behavioral Health Measures:     PHQ-9:       03/24/2024    10:15 AM   PHQ-9   Interest 2   Feeling 1   Sleeping 1   Tired 3   Appetite 1   Feeling Bad 1   Concentrating 1   Moving and Speaking 0   Suicidal Thoughts 0   Problems Somewhat difficult   PHQ-9 Total 10   PHQ-9 Affective Total 5   PHQ-9 Physical Total 5   PHQ-9 Cognitive Total 1       GAD-7:       03/24/2024    10:16 AM   GAD7 Results   Feeling Nervous, Anxious, or on Edge 1   Not Being Able to Stop or Control Worrying 1  Worrying too Much About Different Things 1   Trouble Relaxing 2   Being so Restless That it is Hard to Sit Still 0   Becoming Easily Annoyed or Irritable 2   Feeling Afraid as if Something Awful Might Happen 0   GAD-7 Total Score 7   If you checked off any problems, how difficult have these problems made it for you to do your work, take care of things at home, or get along with other people? Somewhat difficult        Psychotherapy Content:     [name] reports improved response to interactions with her daughter and in-laws.  Patient states that she is more accepting of current situation and lack of control.  The patient states she is also feeling better due to her daughter's improved medical condition at this time.  Patient states that concerns are still  present.  Patient states interaction with her grandson also lead to improved mood.  Patient and this clinical research associate discussed past history in difficulty in relationships.  Patient and this writer acknowledged emotional expression inwardly or to others.  Patient to become more accepting of messages of emotion and expressing them appropriately.    Interventions  Clinician reviewed homework regarding letting go of control.  Normalized feelings of loss and coping with expectations.  Introduced strategy of expressing emotions with boundaries.    Response to treatment  [name] was responsive to practicing mindfulness and emotional regulation skills and felt they were helping.  [name] rated decreased uncomfortable feelings and attributed the change to letting go of control.  [name] willing to practice mood diary with increased benefit of emotional awareness.  [name] willing to practice intentional grieving rituals to decrease and direct the arousal of triggers.     Therapeutic Modalities Used:      -Clinician utilized supportive psychotherapy interventions including empathic listening, validation, reflection, and problem solving.  -Clinician used motivational interviewing/Socratic questioning during the session.  -Clinician provided brief psychoeducation about CBT techniques commonly used to address depression/anxiety in therapy.  -Clinician utilized a strength-based perspective to assist patient in exploring skills and strengths to be used in overcoming current difficulties.    Plan:      -Patient may benefit from continued engagement in talk therapy and to facilitate continued adjustments; continued boundary setting and self care will be pursued.    -Continue current therapeutic focus with weekly to bi-weekly individual psychotherapy sessions.  -Next scheduled session: 12-4-2 at 10:15    Resources: Mindfulness techniques, Grounding techniques, and Emotional awareness model for primary & secondary feelings  Homework:1- 3 times a week  Journal experiences during the day for 15 minutes that appeared to trigger negative thought patterns.  Practice mindfulness techniques 3 times a week to decrease stimulus response.    Goals: - Review Handout or model discussed in treatment to identify if current strategy is useful. and - Consider a checklist or journal of daily activities as you get used to a new routine. Remember to provide yourself with positive feedback and PRAISE rather than criticism!    In [place] Chyrl Milta KEN  License No [id number]  Valid until: 07/02/2024  Behavioral Health Clinician  Collaborative Care Program  [facility]      The patient understands and agrees with the plan of care outlined.    Have questions about billing or payments?  - Call [phone]; available Monday-Friday, 8:30am to 4pm  - Toll free: [phone]  - TDD (hearing impaired): [phone]  - Email:  [email or web address]

## 2024-03-28 ENCOUNTER — Telehealth: Payer: Self-pay | Admitting: Family Medicine

## 2024-03-28 DIAGNOSIS — E1129 Type 2 diabetes mellitus with other diabetic kidney complication: Secondary | ICD-10-CM

## 2024-03-28 MED ORDER — OZEMPIC 1 MG/DOSE (4 MG/3 ML) SUBCUTANEOUS PEN INJECTOR: 1.0000 mg | PEN_INJECTOR | SUBCUTANEOUS | 3 refills | Status: DC

## 2024-03-28 MED ORDER — OZEMPIC 0.25 MG OR 0.5 MG (2 MG/3 ML) SUBCUTANEOUS PEN INJECTOR: 0.5000 mg | PEN_INJECTOR | SUBCUTANEOUS | 0 refills | Status: DC

## 2024-03-28 NOTE — Telephone Encounter (Signed)
 Spoke to patients pharmacy and confirmed that prescription was correct and if they would refill. Pharmacy confirmed order was correct and will fill the prescription. Relayed message to patient and she will be picking up prescription. No further action.

## 2024-03-28 NOTE — Telephone Encounter (Signed)
 Reviewed chart. New Rx sent in.

## 2024-03-28 NOTE — Telephone Encounter (Signed)
 Advice:    Requesting advice about the prescription of   semaglutide  (Ozempic ) 0.25 mg or 0.5 mg (2 mg/3 mL) pen injector and the instructions are : Inject 0.5 mg subcutaneously one time each week. After 4 weeks, increase to 0.5 mg weekly     Patient is calling in regards of the medication above. She said she just left the pharmacy and they said the way the prescription is written they are unable to fill . Pharmacy told patient they had fax the info to the office twice . Please advise. Thanks     Bryna Razavi  PCC,PSR II

## 2024-04-07 ENCOUNTER — Ambulatory Visit

## 2024-04-07 DIAGNOSIS — F332 Major depressive disorder, recurrent severe without psychotic features: Secondary | ICD-10-CM

## 2024-04-07 NOTE — Progress Notes (Addendum)
 Paula Daugherty, If you are reviewing this progress note and have questions about the meaning or clinical terms being used, please feel free to bring it up when we talk next.  Medical notes are meant to be a communication tool between professional providers and require medical terms to be used for efficiency. You can also look up terms via on-line medical dictionaries such as Medline Plus at:  muscletreatments.it.htm      Patient was discussed during Collaborative Care (CoCM) Systematic Case Review (SCR) meeting.    Assessment      - Patient has completed 5 therapy sessions since intake and is demonstrating an improvement in symptoms as evidenced by screening scores, and an increase in ability to use coping skills including positive self-talk, boundary setting, and learning mindfulness techniques to mitigate depressive and anxious symptomology.    Reason for review  - Requesting medication evaluation.  - Guidance regarding modality of treatment.  - Has completed 5th session.  - loss and anxiety and depression symptoms maintained or not improving.    Plan  - Continue current therapeutic focus w/ weekly to bi-weekly individual psychotherapy sessions.  - Change current therapeutic focus to include behavior activation to improve mood.      - No medication recommendations have been provided to [name], [name], MD at this time.        Chyrl Ply, LCSW  License No [id number]  Valid until: 07/02/2024  Behavioral Health Clinician  Collaborative Care Program  [facility]      I'm a Collaborative Care [facility]) Program Psychiatry consultant Psychiatry Mental Health Nurse Practitioner at [facility]. This patient was discussed in [facility] on 04/07/2024 with Reyes Ply, LCSW. I have not personally examined or met with this patient. I do agree with LCSW's assessment (see note).     Based on this assessment, agree w/ plan for patient to continue psychotherapy visits with LCSW.     No provider  treatment recommendations at this time.     Thank you for referring patient to [facility].     Electronically signed by: Clarita Mallick, DNP, FNP-C, PMHNP-BC  Collaborative Care Provider   [facility]

## 2024-04-07 NOTE — Progress Notes (Signed)
 Paula Daugherty, If you are reviewing this progress note and have questions about the meaning or clinical terms being used, please feel free to bring it up when we talk next.  Medical notes are meant to be a communication tool between professional providers and require medical terms to be used for efficiency. You can also look up terms via on-line medical dictionaries such as Medline Plus at:  achegone.com    Medical Record Number:  [id number]   Patient:  Paula Daugherty   Date of Birth:  16-Mar-1946   Date of Visit:  04/07/2024     PPE USED:  Patient was wearing a surgical mask.no  Provider was wearing a surgical mask and face shield: no      Type of Visit: Individual Psychotherapy  Time: 55 min.   CPT Code: 09162 - Psychotherapy, (53 mins or more) 60 minutes with patient and/or family member.    Identifying Information/ Chief Complaint:     Paula Daugherty is a 78yr old female presenting for anxiety and depression symptoms.    Objective/Mental Status Exam:      Appearance: appropriate  Kinetics & gait: Normal gait, no apparent psychomotor retardation or agitation.   Demeanor: cooperative  Speech: Coherent, clear, appropriate volume, normal rhythm and flow  Language: Normal word choice, vocabulary adequate for communication.  Mood/Affect: Worried, Frustrated, Irritable, and Angry mood with congruent to mood and appropriate to content affect.   Suicidal/Homicidal/Assaultive Thoughts/Intent/Plans:  Denies current SI/HI, intent, mean and plan.   Thought Process: Linear & goal directed  Thought Content: appropriate, no observed evidence of illogical thought.  Orientation:  Alert and Oriented  Attention: normal and follows conversation thread  Memory: No short or long-term memory deficits evident.   Judgment:  Good  Insight: Good    Assessment:     -This is patient's 5th session since intake appointment. The Patient is demonstrating stable scores on screening tools, and subjectively reports  difficult 2 weeks w/ her daughter's medical issues and family relationship issues.     -Patient is progressing towards goals as evidenced by continued participation in the therapeutic process and overall high investment in achieving therapeutic goals.    Safety    Pt's acute risk for suicide is low. Pt presents without current suicidal ideation, intent, plan, nor preparatory behavior.     Pt demonstrates no imminent risk of danger to others given no H/I reported today, no aggressive behavior, and no known h/o violence.      Plan to continue to assess and a higher level of care is not indicated at this time.    -Pt demonstrates no imminent risk of danger to others given no H/I reported today, no aggressive behavior, and no known h/o violence.  Plan to continue to assess and a higher level of care is not indicated at this time.    Medication questions:  Medications not discussed this session.    Diagnosis:    No new or revised diagnoses are needed at this time.     Validated Behavioral Health Measures:     PHQ-9:       03/24/2024    10:15 AM   PHQ-9   Interest 2   Feeling 1   Sleeping 1   Tired 3   Appetite 1   Feeling Bad 1   Concentrating 1   Moving and Speaking 0   Suicidal Thoughts 0   Problems Somewhat difficult   PHQ-9 Total 10   PHQ-9 Affective Total 5  PHQ-9 Physical Total 5   PHQ-9 Cognitive Total 1       GAD-7:       03/24/2024    10:16 AM   GAD7 Results   Feeling Nervous, Anxious, or on Edge 1   Not Being Able to Stop or Control Worrying 1   Worrying too Much About Different Things 1   Trouble Relaxing 2   Being so Restless That it is Hard to Sit Still 0   Becoming Easily Annoyed or Irritable 2   Feeling Afraid as if Something Awful Might Happen 0   GAD-7 Total Score 7   If you checked off any problems, how difficult have these problems made it for you to do your work, take care of things at home, or get along with other people? Somewhat difficult        Psychotherapy Content:     Paula Daugherty reports decreased  irritability and increased comfort with changing relationships and their dynamics in the family.  Patient stated review of emotional awareness homework from last week.  Patient reviewed recent interactions with family and engaged in decision-making about how to respond.  Patient and this clinical research associate discussed secondary rewards when providing emotional support for others.  Patient was encouraged to have good boundaries and see benefits for herself and others when providing and having healthy emotional limits.    Interventions  Led client in recent interactions with family.  Clinician reviewed homework regarding mindfulness, gaining social support and journaling.  Provided psychoeducation of secondary rewards to emotional support for others.    Response to treatment  Paula Daugherty was responsive to practicing mindfulness and communication skills and felt they were helping.  Paula Daugherty expressed feelings when prompted to use coping strategies.  Paula Daugherty rated decreased uncomfortable feelings and attributed the change to emotional awareness and self-care.  Paula Daugherty willing to practice mood diary with increased benefit of emotional awareness.  Paula Daugherty willing to practice reframing thought patterns and evaluate behavior rather than engage in negative self talk. Paula Daugherty will continue practice CBT skills.     Therapeutic Modalities Used:      -Clinician utilized supportive psychotherapy interventions including empathic listening, validation, reflection, and problem solving.  -Clinician used motivational interviewing/Socratic questioning during the session.  -Clinician provided brief psychoeducation about CBT techniques commonly used to address depression/anxiety in therapy.  -Clinician utilized a strength-based perspective to assist patient in exploring skills and strengths to be used in overcoming current difficulties.    Plan:      -Patient may benefit from continued engagement in talk therapy and to facilitate continued adjustments; continued  self-care and behavioral activation and boundary setting will be pursued.    -Continue current therapeutic focus with weekly to bi-weekly individual psychotherapy sessions.  -Next scheduled session: 04-22-24 At 9:00    Resources:  Communication pattern hand out  Homework:Identify 1 thing to do per day for intentional increased pleasure.  Practice mindfulness techniques 3 times a week to decrease stimulus response.    Goals: - Review Handout or model discussed in treatment to identify if current strategy is useful. and  - Follow your Doctor's Guidelines to stay physically active. Exercise is good for your body, mental health and sleep.    In [place] Paula Daugherty  License No [id number]  Valid until: 07/02/2024  Behavioral Health Clinician  Collaborative Care Program  [facility]      The patient understands and agrees with the plan of care outlined.    Have questions  about billing or payments?  - Call [phone]; available Monday-Friday, 8:30am to 4pm  - Toll free: [phone]  - TDD (hearing impaired): [phone]  - Email: [email or web address]

## 2024-04-10 ENCOUNTER — Other Ambulatory Visit: Payer: Self-pay | Admitting: NURSE PRACTITIONER

## 2024-04-10 DIAGNOSIS — I10 Essential (primary) hypertension: Secondary | ICD-10-CM

## 2024-04-11 ENCOUNTER — Ambulatory Visit: Payer: Vision Other Private Insurance | Admitting: Optometrist

## 2024-04-11 DIAGNOSIS — H33301 Unspecified retinal break, right eye: Secondary | ICD-10-CM

## 2024-04-11 DIAGNOSIS — Z961 Presence of intraocular lens: Secondary | ICD-10-CM

## 2024-04-11 DIAGNOSIS — H5200 Hypermetropia, unspecified eye: Secondary | ICD-10-CM

## 2024-04-11 DIAGNOSIS — Z7985 Long-term (current) use of injectable non-insulin antidiabetic drugs: Secondary | ICD-10-CM

## 2024-04-11 DIAGNOSIS — H524 Presbyopia: Secondary | ICD-10-CM

## 2024-04-11 DIAGNOSIS — E119 Type 2 diabetes mellitus without complications: Secondary | ICD-10-CM

## 2024-04-11 DIAGNOSIS — H33311 Horseshoe tear of retina without detachment, right eye: Secondary | ICD-10-CM

## 2024-04-11 NOTE — Progress Notes (Signed)
 If you are reviewing this progress note and have questions on its meaning or medical terms, please discuss at our clinic visit. The purpose of a chart note is to convey the pertinent medical information as concisely and thoroughly as possible for our medical colleagues and thus there may be esoteric medical terms and acronyms.         Chief Complaint   Patient presents with    Eye Exam     Paula Daugherty is a 78yr old female is here for routine eye exam. CC: Pt reports no va changes. DM not under control- patient started ozempic  and insulin  this year. No symptoms of flashing lights or new floaters    Gtts: Refresh QPM        Lab Results   Lab Name Value Date/Time    HGBA1C 7.5 (H) 01/18/2024 10:06 AM    HGBA1C 6.8 (H) 10/15/2023 09:59 AM    HGBA1C 7.3 (H) 06/17/2023 10:33 AM    HGBA1C 6.6 (H) 08/30/2015 08:35 AM    HGBA1C 7.7 (H) 03/23/2015 09:04 AM    HGBA1C 6.9 (H) 12/06/2014 11:45 AM         Pertinent Personal Medical History:  See EMR-No changes  DM    Past Ocular History:   Retinal tear OD    Ocular Medications:  none      Assessment/Plan:    Low Hyperopic presbyope.  Released a glasses prescription to the patient, if desired.  Asymptomatic Retinal tear inferior OD. No change. Cont to monitor yearly, or sooner if symptoms arise.  Non - insulin  dependent diabetes without retinopathy OU.  No evidence of diabetic retinopathy nor clinically significant macular edema was present in either eye.  I emphasized the long-term risk to vision from diabetes and the importance of strict glycemic control, blood pressure control, along with regular eye exams to detect diabetic retinopathy at an early stage to prevent vision loss. She should return in one year for a dilated eye examination, or sooner if any changes are noticed.  4. Status post cataract extraction OU. Stable.    Return to clinic in one year for a general eye examination or sooner if any changes are noticed.    VSP retinal screening-covered in full    Randy Gaba, O.D, F.A.A.O., Dipl AAO  Photographer of the [employer], [place], Orthoptist and [employer]

## 2024-04-12 NOTE — Telephone Encounter (Signed)
 Pharmacy Refill Optimization (PRO)    Refill authorized per PRO CPA 690-00 04/12/2024    Meets PRO CPA 690-00: YES    See Protocol Details for additional information   ====================================================================    Medication name:   Requested Prescriptions     Pending Prescriptions Disp Refills    amLODIPine  (Norvasc) 10 mg tablet [Pharmacy Med Name: AMLODIPINE  BESYLATE 10MG TABLETS] 90 tablet 3     Sig: Take 1 tablet by mouth every day.     02/05/24 Meds marked reviewed

## 2024-04-20 ENCOUNTER — Ambulatory Visit: Admitting: "Endocrinology

## 2024-04-20 ENCOUNTER — Encounter: Payer: Self-pay | Admitting: "Endocrinology

## 2024-04-20 VITALS — BP 115/75 | HR 61 | Temp 97.7°F | Ht 62.0 in | Wt 165.1 lb

## 2024-04-20 DIAGNOSIS — E119 Type 2 diabetes mellitus without complications: Secondary | ICD-10-CM

## 2024-04-20 MED ORDER — PEN NEEDLE, DIABETIC 32 GAUGE X 5/32": 11 refills | Status: DC

## 2024-04-20 MED ORDER — PEN NEEDLE, DIABETIC 31 GAUGE X 5/16": 11 refills | Status: AC

## 2024-04-20 NOTE — Nursing Note (Signed)
 Patient verified with two identifiers.    Vitals obtained for visit, including BP, Pulse/Ox, Temp, Wt/Ht.  Verified Allergies, Tobacco Use Status, and Preferred Pharmacy.  Pain assessed    04/20/2024  Venetia Rash, MA

## 2024-04-20 NOTE — Patient Instructions (Signed)
 No changes to medications today  Let Dr. Arman know how things are going afer 2-3 more injections of the Ozempic  1 mg dose  Have A1c done on Friday  Return to clinic as scheduled in March

## 2024-04-22 ENCOUNTER — Ambulatory Visit: Attending: GASTROENTEROLOGY

## 2024-04-22 ENCOUNTER — Ambulatory Visit (INDEPENDENT_AMBULATORY_CARE_PROVIDER_SITE_OTHER)

## 2024-04-22 DIAGNOSIS — E538 Deficiency of other specified B group vitamins: Secondary | ICD-10-CM

## 2024-04-22 DIAGNOSIS — K7460 Unspecified cirrhosis of liver: Secondary | ICD-10-CM

## 2024-04-22 DIAGNOSIS — E1121 Type 2 diabetes mellitus with diabetic nephropathy: Secondary | ICD-10-CM

## 2024-04-22 DIAGNOSIS — E1129 Type 2 diabetes mellitus with other diabetic kidney complication: Secondary | ICD-10-CM | POA: Insufficient documentation

## 2024-04-22 DIAGNOSIS — F332 Major depressive disorder, recurrent severe without psychotic features: Secondary | ICD-10-CM

## 2024-04-22 DIAGNOSIS — Z794 Long term (current) use of insulin: Secondary | ICD-10-CM | POA: Insufficient documentation

## 2024-04-22 DIAGNOSIS — E785 Hyperlipidemia, unspecified: Secondary | ICD-10-CM

## 2024-04-22 LAB — COMPREHENSIVE METABOLIC PANEL
Alanine Transferase (ALT): 32 U/L (ref <=33)
Albumin: 4.2 g/dL (ref 4.0–4.9)
Alkaline Phosphatase (ALP): 109 U/L (ref 35–129)
Anion Gap: 13 mmol/L (ref 7–15)
Aspartate Transaminase (AST): 31 U/L (ref <=41)
Bilirubin Total: 0.7 mg/dL (ref <=1.2)
Calcium: 9.3 mg/dL (ref 8.6–10.0)
Carbon Dioxide Total: 24 mmol/L (ref 22–29)
Chloride: 105 mmol/L (ref 98–107)
Creatinine Serum: 2.02 mg/dL — ABNORMAL HIGH (ref 0.51–1.17)
E-GFR Creatinine (Female): 25 mL/min/1.73m*2
Glucose: 159 mg/dL — ABNORMAL HIGH (ref 74–109)
Potassium: 4.3 mmol/L (ref 3.4–5.1)
Protein: 7.8 g/dL (ref 6.6–8.7)
Sodium: 142 mmol/L (ref 136–145)
Urea Nitrogen, Blood (BUN): 34 mg/dL — ABNORMAL HIGH (ref 6–20)

## 2024-04-22 LAB — CBC WITH DIFFERENTIAL
Basophils % Auto: 0.6 %
Basophils Abs Auto: 0 K/MM3 (ref 0.0–0.2)
Eosinophils % Auto: 2.5 %
Eosinophils Abs Auto: 0.1 K/MM3 (ref 0.0–0.5)
Hematocrit: 34.8 % — ABNORMAL LOW (ref 36.0–46.0)
Hemoglobin: 11.7 g/dL — ABNORMAL LOW (ref 12.0–16.0)
Lymphocytes % Auto: 25.5 %
Lymphocytes Abs Auto: 1.5 K/MM3 (ref 1.0–4.8)
MCH: 28.2 pg (ref 27.0–33.0)
MCHC: 33.6 % (ref 32.0–36.0)
MCV: 83.9 fL (ref 80.0–100.0)
MPV: 8 fL (ref 6.8–10.0)
Monocytes % Auto: 8 %
Monocytes Abs Auto: 0.5 K/MM3 (ref 0.1–0.8)
Neutrophils % Auto: 63.4 %
Neutrophils Abs Auto: 3.7 K/MM3 (ref 1.8–7.7)
Platelet Count: 154 K/MM3 (ref 130–400)
RDW: 20 % — ABNORMAL HIGH (ref 0.0–14.7)
Red Blood Cell Count: 4.15 M/MM3 (ref 4.00–5.20)
White Blood Cell Count: 5.9 K/MM3 (ref 4.5–11.0)

## 2024-04-22 LAB — LIPID PANEL WITH DLDL REFLEX
Cholesterol: 137 mg/dL (ref <200)
HDL Cholesterol: 46 mg/dL (ref >40)
LDL Cholesterol Calculation: 35 mg/dL (ref <100)
Non-HDL Cholesterol: 91 mg/dL (ref <=150)
Total Cholesterol: HDL Ratio: 3 (ref <4.0)
Triglyceride Level: 280 mg/dL — ABNORMAL HIGH (ref <150)

## 2024-04-22 LAB — HEMOGLOBIN A1C
Hgb A1C,Glucose Est Avg: 171 mg/dL
Hgb A1C: 7.6 % — ABNORMAL HIGH (ref 3.9–5.6)

## 2024-04-22 LAB — VITAMIN B12: Vitamin B12: >2000 pg/mL — ABNORMAL HIGH (ref 213–816)

## 2024-04-22 LAB — INR
INR: 1 (ref 0.9–1.1)
Prothrombin Time: 11.7 s (ref 10.0–12.8)

## 2024-04-22 NOTE — Progress Notes (Signed)
 [name], If you are reviewing this progress note and have questions about the meaning or clinical terms being used, please feel free to bring it up when we talk next.  Medical notes are meant to be a communication tool between professional providers and require medical terms to be used for efficiency. You can also look up terms via on-line medical dictionaries such as Medline Plus at:  achegone.com    Medical Record Number:  [id number]   Patient:  Paula Daugherty   Date of Birth:  Apr 26, 1946   Date of Visit:  04/22/2024     PPE USED:  Patient was wearing a surgical mask.no  Provider was wearing a surgical mask and face shield: no      Type of Visit: Individual Psychotherapy  Time: 45 min.   CPT Code: 09165 - Psychotherapy, (38-52 mins) 45 minutes with patient and/or family member.    Identifying Information/ Chief Complaint:     Paula Daugherty is a 78yr old female presenting for loss and anxiety and depression symptoms.    Objective/Mental Status Exam:      Appearance: appropriate  Kinetics & gait: Normal gait, no apparent psychomotor retardation or agitation.   Demeanor: cooperative  Speech: Coherent, clear, appropriate volume, normal rhythm and flow  Language: Normal word choice, vocabulary adequate for communication.  Mood/Affect: Worried, Frustrated, Anxious, Irritable, Angry, Depressed, and Sad mood with congruent to mood and appropriate to content affect.   Suicidal/Homicidal/Assaultive Thoughts/Intent/Plans:  Denies current SI/HI, intent, mean and plan.   Thought Process: Linear & goal directed  Thought Content: appropriate, no observed evidence of illogical thought.  Orientation:  Alert and Oriented  Attention: normal and follows conversation thread  Memory: No short or long-term memory deficits evident.   Judgment:  Good  Insight: Good    Assessment:     -This is patient's 5th session since intake appointment. The Patient is demonstrating higher scores on screening  tools, and subjectively reports this has been the worst week.     -Patient is progressing towards goals as evidenced by continued participation in the therapeutic process and overall high investment in achieving therapeutic goals.    Safety    Pt's acute risk for suicide is low. Pt presents without current suicidal ideation, intent, plan, nor preparatory behavior.     Pt demonstrates no imminent risk of danger to others given no H/I reported today, no aggressive behavior, and no known h/o violence.      Plan to continue to assess and a higher level of care is not indicated at this time.    Medication questions:  Medications not discussed this session.    Diagnosis:    No new or revised diagnoses are needed at this time.     Validated Behavioral Health Measures:     PHQ-9:       04/22/2024    10:00 AM   PHQ-9   Interest 3   Feeling 3   Sleeping 2   Tired 3   Appetite 2   Feeling Bad 1   Concentrating 1   Moving and Speaking 0   Suicidal Thoughts 0   Problems Very difficult   PHQ-9 Total 15   PHQ-9 Affective Total 8   PHQ-9 Physical Total 7   PHQ-9 Cognitive Total 1       GAD-7:       04/22/2024     9:58 AM   GAD7 Results   Feeling Nervous, Anxious, or on Edge 3   Not  Being Able to Stop or Control Worrying 3   Worrying too Much About Different Things 3   Trouble Relaxing 2   Being so Restless That it is Hard to Sit Still 0   Becoming Easily Annoyed or Irritable 3   Feeling Afraid as if Something Awful Might Happen 1   GAD-7 Total Score 15   If you checked off any problems, how difficult have these problems made it for you to do your work, take care of things at home, or get along with other people? Very difficult        Psychotherapy Content:     Audie reports continued concern regarding her daughter and physical issues impacting her overall health in recent hospitalization with unknown causation were some of the symptoms.  Patient expressed feelings regarding situation in looked at event and choices made to be  acknowledging emotions as well as attempting plan for future situations.  Patient showed insight regarding interactions with her family.  Patient showing logical and intentional attempts to have good boundaries and support others.  Patient states that she is doing her best to take care of self as well as other family members with decreased emotional and physical energy.  Patient was praised for behaviors and acknowledged for amount of effort.  Patient appeared to feel improvement regarding than or responses.      Interventions  Led client in review of current life stressors.  Introduced strategy of events sequencing assessment to improve behavioral response.  Clinician encouraged caring response with boundaries to support improved relationships.    Response to treatment  Inaaya was responsive to practicing mindfulness and emotional regulation skills and felt they were helping.  Reannah willing to practice mindfulness to decrease and direct the arousal of triggers.   Jenissa willing to practice reframing thought patterns and evaluate behavior rather than engage in negative self talk. Xcaret will continue practice CBT skills.     Therapeutic Modalities Used:      -Clinician utilized supportive psychotherapy interventions including empathic listening, validation, reflection, and problem solving.  -Clinician provided brief psychoeducation about CBT techniques commonly used to address depression/anxiety in therapy.  -Clinician utilized a strength-based perspective to assist patient in exploring skills and strengths to be used in overcoming current difficulties.    Plan:      -Patient may benefit from continued engagement in talk therapy and to facilitate continued adjustments; continued boundary setting, self-care and behavioral activation will be pursued.    -Continue current therapeutic focus with weekly to bi-weekly individual psychotherapy sessions.  -Next scheduled session: 05-12-24 at 10:15    Resources: CBT Handout   and  Emotional awareness model for primary & secondary feelings  Homework:Practice mindfulness techniques 3 times a week to decrease stimulus response.  Make intentional time to identify issues of concern and create a heartfelt and clear message regarding needs or concerns. and Create a habit of Gratitude to improve perspective at the start or end of the day.    Goals: - Take medication and consult with provider regarding questions or concerns. - Review Handout or model discussed in treatment to identify if current strategy is useful. and - Consider a checklist or journal of daily activities as you get used to a new routine. Remember to provide yourself with positive feedback and PRAISE rather than criticism!    In [place] Chyrl Milta KEN  License No [id number]  Valid until: 07/02/2024  Behavioral Health Clinician  Collaborative Care Program  [facility]      The patient understands and agrees with the plan of care outlined.    Have questions about billing or payments?  - Call [phone]; available Monday-Friday, 8:30am to 4pm  - Toll free: [phone]  - TDD (hearing impaired): [phone]  - Email: [email or web address]

## 2024-04-25 ENCOUNTER — Encounter: Payer: Self-pay | Admitting: GASTROENTEROLOGY

## 2024-04-25 ENCOUNTER — Ambulatory Visit: Payer: Self-pay | Admitting: GASTROENTEROLOGY

## 2024-04-25 DIAGNOSIS — K7469 Other cirrhosis of liver: Secondary | ICD-10-CM

## 2024-04-25 DIAGNOSIS — K31819 Angiodysplasia of stomach and duodenum without bleeding: Secondary | ICD-10-CM

## 2024-04-25 DIAGNOSIS — N179 Acute kidney failure, unspecified: Secondary | ICD-10-CM

## 2024-04-26 LAB — AFP CANCER MARKER: AFP Cancer Marker: 4.4 ng/mL (ref <=8.8)

## 2024-04-29 ENCOUNTER — Encounter: Payer: Self-pay | Admitting: Family Medicine

## 2024-04-29 ENCOUNTER — Ambulatory Visit: Attending: Family Medicine

## 2024-04-29 ENCOUNTER — Ambulatory Visit: Payer: Self-pay | Admitting: Family Medicine

## 2024-04-29 DIAGNOSIS — K7469 Other cirrhosis of liver: Secondary | ICD-10-CM | POA: Insufficient documentation

## 2024-04-29 DIAGNOSIS — N179 Acute kidney failure, unspecified: Secondary | ICD-10-CM | POA: Insufficient documentation

## 2024-04-29 DIAGNOSIS — K31819 Angiodysplasia of stomach and duodenum without bleeding: Secondary | ICD-10-CM | POA: Insufficient documentation

## 2024-04-29 DIAGNOSIS — E1129 Type 2 diabetes mellitus with other diabetic kidney complication: Secondary | ICD-10-CM

## 2024-04-29 LAB — BASIC METABOLIC PANEL
Anion Gap: 14 mmol/L (ref 7–15)
Calcium: 10 mg/dL (ref 8.6–10.0)
Carbon Dioxide Total: 24 mmol/L (ref 22–29)
Chloride: 101 mmol/L (ref 98–107)
Creatinine Serum: 1.35 mg/dL — ABNORMAL HIGH (ref 0.51–1.17)
E-GFR Creatinine (Female): 41 mL/min/1.73m*2
Glucose: 173 mg/dL — ABNORMAL HIGH (ref 74–109)
Potassium: 4.4 mmol/L (ref 3.4–5.1)
Sodium: 139 mmol/L (ref 136–145)
Urea Nitrogen, Blood (BUN): 23 mg/dL — ABNORMAL HIGH (ref 6–20)

## 2024-04-29 LAB — FERRITIN: Ferritin: 59 ng/mL (ref 13–150)

## 2024-04-29 NOTE — Telephone Encounter (Signed)
 Requested additional information from patient.

## 2024-04-29 NOTE — Telephone Encounter (Signed)
 CONSULT PROVIDER:  Please review patient's message regarding lab results.

## 2024-05-02 ENCOUNTER — Encounter: Payer: Self-pay | Admitting: "Endocrinology

## 2024-05-03 MED ORDER — OZEMPIC 0.25 MG OR 0.5 MG (2 MG/3 ML) SUBCUTANEOUS PEN INJECTOR: 0.5000 mg | PEN_INJECTOR | SUBCUTANEOUS | 2 refills | Status: AC

## 2024-05-11 NOTE — Progress Notes (Signed)
 Endocrinology Follow up clinic note:    Chief Complaint:  I'm here to follow up on my diabetes.    HPI: Paula Daugherty is a 79yr old female who has a diagnosis of type 2 diabetes mellitus who presents to Endocrinology for follow up. Her history is as follows:  She was initially diagnosed with diabetes around 2000 on routine labs. She has a strong family history of diabetes so she wasn't surprised at this. She was started on Metformin  around 2005 and then she was started on insulin  in 2014. She was up to 64 units of Lantus  at night. However, after making changes to her diet and lifestyle, she was taken off insulin  in 2015. Basal insulin  was added back to her regimen in 2018 and she was later able to discontinue this.      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     Interim History: She returns today for follow up. Her last visit with Endocrinology was on 01/20/2024. Since this visit, she has been tolerating Ozempic  well. She has increased her Ozempic  dose to 1 mg and has tolerated this well for 2 doses with no clear GI side effects.   She has not had any recent hypoglycemia or symptoms of hypoglycemia.     Current Regimen:               Ozempic  1 mg weekly              Lantus  40 units QHS   Hypoglycemia: None recently  Meter brought today: Yes  Checks blood glucose: 2x/day  Home blood glucose numbers per pt's log:   Blood Glucose:                                     Pre-breakfast: 130 to 193                                        Pre-dinner: 91 to 209                          Last eye exam: 04/11/2024        ROS:  All other systems were reviewed and are negatative except for pertinent positive and negative responses as documented in HPI.     Medications:  Medication reconciliation was performed today.   Medications Ordered Prior to Encounter[1]    I did review patient's past medical and family/social history, no changes noted.   PMH:  Past medical history was reviewed from problem  list.   Problem List[2]    VITAL SIGNS:  BP 115/75 (SITE: right arm, Orthostatic Position: sitting, Cuff Size: large)   Pulse 61   Temp 36.5 C (97.7 F) (Temporal)   Ht 1.575 m (5' 2)   Wt 74.9 kg (165 lb 2 oz)   LMP 05/06/1983   SpO2 99%   BMI 30.20 kg/m   Body mass index is 30.2 kg/m.    PHYSICAL EXAM:  General Appearance: healthy, alert, no distress, pleasant affect, cooperative.  Eyes:  conjunctivae and corneas clear. EOM's intact. sclerae normal.  Neck:  Neck supple.   Extremities:  no cyanosis, clubbing, or edema.  Skin:  Skin color, texture, turgor normal. No rashes or lesions.  Neuro: No  tremor appreciated.  Mental Status: Appearance/Cooperation: in no apparent distress and well developed and well nourished  Eye Contact: normal  Speech: normal volume, rate, and pitch    LAB TESTS/STUDIES:   I personally reviewed the following laboratory and/or imaging studies.   01/18/24 10:06 01/27/24 11:09   Sodium 141 141   Potassium 4.5 4.9   Chloride 104 103   Carbon Dioxide Total 24 23   Anion Gap 13 15   Urea Nitrogen, Blood (BUN) 20 23 (H)   Creatinine Blood 1.27 (H) 1.34 (H)   Glucose 190 (H) 153 (H)   Calcium  9.4 9.8   Protein 8.0 7.9   Albumin 4.2 4.2   Alkaline Phosphatase (ALP) 158 (H) 122   Aspartate Transaminase (AST) 51 (H) 36   Bilirubin Total 0.7 0.8   Alanine Transferase (ALT) 61 (H) 33   E-GFR Creatinine (Female) 44 41   Cholesterol 142    Triglyceride 162 (H)    LDL Cholesterol Calculation NOTE:    HDL Cholesterol 59    Non-HDL Cholesterol 83    Total Cholesterol:HDL Ratio 2.4    Folate 9.1    Thyroid  Stimulating Hormone 1.31    Hgb A1C 7.5 (H)    Hgb A1C,Glucose Est Avg 169    Methylmalonic Acid Level 1.55 (H)    Vitamin B1(Thiamine ) Whole Bl 89    Vitamin B12 204 (L)       01/11/24 10:23   White Blood Cell Count 8.1   Red Blood Cell Count 4.49   Hemoglobin 11.5 (L)   Hematocrit 36.3   MCV 80.8   MCH 25.6 (L)   MCHC 31.7 (L)   RDW 16.5 (H)   Platelet Count 252      06/17/23 10:33   Creatinine  Spot Urine 155.2  155.2   Microalbumin Urine 34.1   Microalbumin/Creatinine Ratio 220     Thyroid  ultrasound (09/25/2021):  FINDINGS:  Parenchymal echotexture: heterogeneous.  Right lobe: 3.9 x 1.4 x 1.9 cm (5.1 cc).  Nodules:  Subcentimeter nodule(s) present but none that meet criteria for FNA or  follow up.  Left lobe: 4.1 x 1.7 x 2.1 cm (6.9 cc).  Nodules:  1. Mid lobe, 0.9 cm, solid or almost completely solid (2 pts),  hypoechoic (2 pts), wider-than-tall (0 pt), with smooth margins (0 pt).  Macrocalcifications: absent (0 pt). Peripheral rim calcification: absent (0  pt). Punctate echogenic foci: none (0 pt). TR4 - Moderately suspicious.  Subcentimeter nodule(s) present but none that meet criteria for FNA or  follow up.  Isthmus: 0.3 cm.  Nodules:  none.  No abnormal cervical lymph nodes.      IMPRESSION:  1. No nodules meet criteria for FNA or follow-up     Impression: This is a 79yr old female with Diabetes Mellitus Type II who presents to Endocrinology for follow up. Overall, her glycemic control is above goal as evidenced by her most recent A1c of 7.5%. She had increased Ozempic  to 1 mg weekly and will plan to check with glucose trends after a few more doses 1 mg Ozempic .     She also has a history of an adrenal nodule. Hormonal evaluation was last done and was normal in 04/2021.      DIABETES HISTORY  (-) h/o retinopathy.      (-) h/o microalbuminuria/nephropathy.  (-) h/o neuropathy.  (-) h/o Autonomic dysfunction  (-) h/o Gastroparesis  (-) h/o CAD  (-) h/o PVD     Last eye exam: 04/2024  Last urine microalbumin: 06/2023, 220  Pneumovax: 12/2014, Prevnar 2018  Influenza vaccine: 03/2024  (-) ASA  (+) Statin  (+) ACE/ARB          Recommendations:  (E11.29) Type 2 diabetes mellitus with other diabetic kidney complication  (primary encounter diagnosis)  - No changes to medication doses  - Will plan to check in after a few more doses of Ozempic  1 mg   - Encouraged patient to continue close blood glucose  monitoring, will notify me on glucose trends at home  - Last LDL at goal, continue statin, repeat due 01/2025  - Last microalbumin:creatinine ratio elevated, continue cozaar , repeat due 06/2024  - Up to date on screening eye exam, f/up as scheduled in 04/2024  - Blood pressure at goal, continue ARB  - Repeat A1c prior to follow up appointment  - Discussed daily foot care    Approximately 20 minutes were spent with patient, greater than 50% of which was spent counseling the patient on diabetes management and on coordination of care.    Follow up in 3 months    If you have any questions, please do not hesitate to contact me at [phone].  Thank you for allowing me to participate in the care of this patient.    EDUCATION:  I educated/instructed the patient or caregiver regarding all aspects of the above stated plan of care.  The patient or caregiver indicated understanding.      [place] interpreter was not used.    Report electronically signed by:  Lonell Lamer, M.D.  Clinical Associate Professor  Department of Endocrinology              [1]   Current Outpatient Medications on File Prior to Visit   Medication Sig Dispense Refill    acetaminophen  (TylenoL ) 325 mg tablet Take 1 tablet by mouth if needed for pain (joint pain).      albuterol  (Proair  HFA, Proventil  HFA, Ventolin  HFA) 90 mcg/actuation inhaler Take 2-4 puffs by inhalation every 4 to 6 hours if needed for wheezing. 8.5 g 2    amLODIPine  (Norvasc) 10 mg tablet Take 1 tablet by mouth every day. 90 tablet 3    Atorvastatin  (LIPITOR) 20 mg Tablet Take 1 tablet by mouth every day at bedtime. 90 tablet 3    Blood Sugar Diagnostic (ONETOUCH ULTRA TEST) Strips Use to test blood glucose 2x/day 100 strip 11    CloNIDine  (CATAPRES ) 0.2 mg Tablet Take 1 tablet by mouth 2 times daily. Indications: change of life signs 180 tablet 3    Clopidogrel  (PLAVIX ) 75 mg Tablet Take 1 tablet by mouth every morning. 90 tablet 3    Diclofenac  (VOLTAREN ARTHRITIS PAIN) 1 % Gel Apply to  the affected area 4 times daily. 100 g 1    insulin  glargine (Lantus  Solostar U-100 Insulin ) 100 unit/mL (3 mL) pen Use daily up to 40 units per day. Please provide patient with a 90 day supply 45 mL 3    Losartan  (COZAAR ) 100 mg tablet Take 1 tablet by mouth every day. 90 tablet 3    nystatin  (Klayesta ) 100,000 unit/gram powder APPLY A THIN LAYER OF POWDER TO THE AFFECTED AREA FOUR TIMES DAILY AS NEEDED 60 g 0    Ondansetron  (ZOFRAN -ODT) 8 mg disintegrating tablet Take 1 tablet by mouth every 8 hours if needed. 90 tablet 2     No current facility-administered medications on file prior to visit.   [2]   Patient Active Problem List  Diagnosis  Diabetes (HCC)    Depression with anxiety    Stress at home    Hypertension    GERD (gastroesophageal reflux disease)    Hyperlipidemia with target LDL less than 70    Abnormal LFTs    Renal cyst ( 6.4 cm cyst left kidney)    Cough    Disorder of liver    Macroalbuminuric diabetic nephropathy (HCC)    History of actinic keratoses    Cataract    Ptosis of eyelid    Liver fibrosis    Adrenal nodule (HCC)    Medication Therapy Auth    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HCC)    Mixed conductive and sensorineural hearing loss of both ears    Neoplasm of uncertain behavior of skin    Abdominal pain, generalized    Nausea without vomiting    Asymptomatic microscopic hematuria    Subcutaneous nodule    Angina pectoris (HCC)    Mild episode of recurrent major depressive disorder (HCC)    NASH (nonalcoholic steatohepatitis)    Iron  deficiency anemia due to chronic blood loss    Kidney disorder    Type 2 diabetes mellitus with stage 3a chronic kidney disease, with long-term current use of insulin  (HCC)

## 2024-05-12 ENCOUNTER — Ambulatory Visit

## 2024-05-12 DIAGNOSIS — F332 Major depressive disorder, recurrent severe without psychotic features: Secondary | ICD-10-CM

## 2024-05-12 NOTE — Progress Notes (Signed)
 Paula Daugherty, If you are reviewing this progress note and have questions about the meaning or clinical terms being used, please feel free to bring it up when we talk next.  Medical notes are meant to be a communication tool between professional providers and require medical terms to be used for efficiency. You can also look up terms via on-line medical dictionaries such as Medline Plus at:  achegone.com    Medical Record Number:  [id number]   Patient:  Paula Daugherty   Date of Birth:  1945/07/16   Date of Visit:  05/12/2024     PPE USED:  Patient was wearing a surgical mask.no  Provider was wearing a surgical mask and face shield: no      Type of Visit: Individual Psychotherapy  Time: 45 min.   CPT Code: 09165 - Psychotherapy, (38-52 mins) 45 minutes with patient and/or family member.    Identifying Information/ Chief Complaint:     Paula Daugherty is a 79yr old female presenting for anxiety and depression symptoms.    Objective/Mental Status Exam:      Appearance: appropriate  Kinetics & gait: Normal gait, no apparent psychomotor retardation or agitation.   Demeanor: cooperative  Speech: Coherent, clear, appropriate volume, normal rhythm and flow  Language: Normal word choice, vocabulary adequate for communication.  Mood/Affect: Worried, Fearful, Angry, and Sad mood with congruent to mood and appropriate to content affect.   Suicidal/Homicidal/Assaultive Thoughts/Intent/Plans:  Denies current SI/HI, intent, mean and plan.   Thought Process: Linear & goal directed  Thought Content: appropriate, no observed evidence of illogical thought.  Orientation:  Alert and Oriented  Attention: normal and follows conversation thread  Memory: No short or long-term memory deficits evident.   Judgment:  Good  Insight: Good    Assessment:     -This is patient's 7th session since intake appointment. The Patient is demonstrating higher scores on screening tools, and subjectively reports significant  concerns for daughter's health.     -Patient is progressing towards goals as evidenced by continued participation in the therapeutic process and overall high investment in achieving therapeutic goals.    Safety    Pt's acute risk for suicide is low. Pt presents without current suicidal ideation, intent, plan, nor preparatory behavior.     Pt demonstrates no imminent risk of danger to others given no H/I reported today, no aggressive behavior, and no known h/o violence.      Plan to continue to assess and a higher level of care is not indicated at this time.    Medication questions:  Medications not discussed this session.    Diagnosis:    No new or revised diagnoses are needed at this time.     Validated Behavioral Health Measures:     PHQ-9:       05/12/2024    10:33 AM   PHQ-9   Interest 3   Feeling 3   Sleeping 1   Tired 3   Appetite 1   Feeling Bad 1   Concentrating 1   Moving and Speaking 0   Suicidal Thoughts 0   Problems 2   PHQ-9 Total 13   PHQ-9 Affective Total 8   PHQ-9 Physical Total 5   PHQ-9 Cognitive Total 1       GAD-7:       05/12/2024    10:33 AM   GAD7 Results   Feeling Nervous, Anxious, or on Edge 3   Not Being Able to Stop or Control Worrying 3  Worrying too Much About Different Things 3   Trouble Relaxing 3   Being so Restless That it is Hard to Sit Still 0   Becoming Easily Annoyed or Irritable 3   Feeling Afraid as if Something Awful Might Happen 2   GAD-7 Total Score 17   If you checked off any problems, how difficult have these problems made it for you to do your work, take care of things at home, or get along with other people? Very difficult        Psychotherapy Content:     Paula Daugherty reports increased concern about her daughter who was out of the hospital for a few days and then became less responsive as well as difficulty with alertness that has gone through multiple medical assessments w/ no current answers and continued increase in anger and paranoia with her husband and the medical  providers.    The patient and this clinical research associate reviewed current coping and plan of care.  The patient was encouraged to look at limits of ability to provide support and what she can engage for support of her daughter.  The patient agreed to engage in journaling to decrease stimulus and improve perspective taking of current problem.    Interventions  Led client in discussion of care needs for her daughter.  Normalized feelings of increased anxiety and questioning and coping with concerns about her daughter.  Clinician encouraged assessment and comfort with coping with medical care and self care.    Response to treatment  Paula Daugherty was responsive to practicing self care skills and felt they were helping.  Paula Daugherty willing to practice mood diary with increased benefit of emotional awareness.    Therapeutic Modalities Used:      -Clinician utilized supportive psychotherapy interventions including empathic listening, validation, reflection, and problem solving.  -Clinician utilized a strength-based perspective to assist patient in exploring skills and strengths to be used in overcoming current difficulties.    Plan:      -Patient may benefit from continued engagement in talk therapy and to facilitate continued adjustments; continued boundary setting, behavioral activation and self care will be pursued.    -Continue current therapeutic focus with weekly to bi-weekly individual psychotherapy sessions.  -Next scheduled session: 06-02-24 at 10:15      Homework:Review [facility] to set boundaries.   Identify 1 thing to do per day for intentional increased pleasure.  Review and Journal moments of anxiety  Create a habit of Gratitude to improve perspective at the start or end of the day. and Set a regularly scheduled time to spend focused on your significant other to discuss current achievements and struggles.    Goals: - Please use a reminder to take your Medication as prescribed and alert provider of your questions.   and  - Follow your Doctor's Guidelines to stay physically active. Exercise is good for your body, mental health and sleep.    [place] Chyrl Milta KEN  License No [id number]  Valid until: 07/02/2024  Behavioral Health Clinician  Collaborative Care Program  [facility]      The patient understands and agrees with the plan of care outlined.    Have questions about billing or payments?  - Call [phone]; available Monday-Friday, 8:30am to 4pm  - Toll free: [phone]  - TDD (hearing impaired): [phone]  - Email: [email or web address]

## 2024-05-12 NOTE — Progress Notes (Addendum)
 Paula Daugherty, If you are reviewing this progress note and have questions about the meaning or clinical terms being used, please feel free to bring it up when we talk next.  Medical notes are meant to be a communication tool between professional providers and require medical terms to be used for efficiency. You can also look up terms via on-line medical dictionaries such as Medline Plus at:  muscletreatments.it.htm      Patient was discussed during Collaborative Care (CoCM) Systematic Case Review (SCR) meeting.    Assessment      - Patient has completed 6 therapy sessions since intake and is demonstrating an stability in symptoms as evidenced by screening scores, and an increase in ability to use coping skills including boundary setting, confronting negative automatic thoughts, and keeping busy with behavioral activation.     - Patient's progress in treatment may be improved by review of support from mood and mental health.    Reason for review  - Has completed 6th session.  - loss and anxiety and depression symptoms maintained or not improving.    Plan  - Continue current therapeutic focus w/ weekly transitioning to bi-weekly individual psychotherapy sessions.    - No medication recommendations have been provided to Daugherty, [name], MD at this time.      Chyrl Ply, LCSW  License No [id number]  Valid until: 07/02/2024  Behavioral Health Clinician  Collaborative Care Program  [facility]    I'm a Collaborative Care [facility]) Program Psychiatry consultant Psychiatry Mental Health Nurse Practitioner at [facility]. This patient was discussed in [facility] on 05/13/2023 with Chyrl Ply LCSW. I have not personally examined or met with this patient. I do agree with LCSW's assessment (see note).     Based on this assessment, agree w/ plan for patient to continue psychotherapy visits with LCSW.     No provider treatment recommendations at this time.     Thank you for referring patient to  [facility].     Electronically signed by: Clarita Mallick, DNP, FNP-C, PMHNP-BC  Collaborative Care Provider   [facility]

## 2024-05-17 ENCOUNTER — Ambulatory Visit: Admitting: INTERNAL MEDICINE

## 2024-05-17 VITALS — BP 128/69 | HR 67 | Ht 62.0 in | Wt 161.6 lb

## 2024-05-17 DIAGNOSIS — G4752 REM sleep behavior disorder: Secondary | ICD-10-CM

## 2024-05-17 NOTE — Progress Notes (Signed)
 [facility] Initial Sleep Clinic Consultation.      DATE: 05/17/2024  Paula Daugherty  [address]  [place] [address]  16-Jul-1945  [phone] (home)   [id number]    PCP:   Epifanio Polite, MD  [phone]  [facility] [place] [address]    Dear Dr. Polite Epifanio, MD,     Thank you for allowing me to see your [name] today in the [facility] in consultation.     As you know, this is a 79yr old female with a PMH significant for HTN, HLD, T2DM, CKD, NASH cirrhosis, and CVA who presents with dream enactment behavior.     History of Present Illness  She experiences distressful and sometimes violent dreams, during which she may strike out and become loud, often talking or arguing in her sleep. Her husband sometimes has to wake her up or comfort her during these episodes. She recalls the content of these dreams most of the time. The frequency of these episodes has become more sporadic, occurring every few months, sometimes with multiple episodes in a short period followed by a long period without any. She has a history of vivid, active dreams in color, with a past ability to remember them in detail, though this has diminished. She recently dreamt of a deceased colleague, which is an example of her dream content.    She underwent a sleep study between late 2014 and 2017 at a facility in [place], but she has never been diagnosed with obstructive sleep apnea. She has difficulty falling asleep, often staying awake until 1 or 2 AM despite going to bed around 10 PM. No frequent nightmares outside of the described episodes, restless leg syndrome, or leg twitching during sleep. No known history of obstructive sleep apnea.    She has a history of anxiety and depression, which she has sometimes treated with medication and counseling. She is not currently on medication for these conditions. Her dream-related behaviors are less frequent now, despite high stress levels due to  family issues, including her daughter's hospitalization.    She experienced a stroke in June 2024, resulting in some memory loss and difficulty with word recall, but no changes in her sense of smell or walking ability. She has chronic constipation, managed with Miralax, and a history of stomach cancer.      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    Tobacco Use    Smoking status: Former     Current packs/day: 0.10     Average packs/day: 0.1 packs/day for 20.0 years (2.0 ttl pk-yrs)     Types: Cigarettes     Passive exposure: Never    Smokeless tobacco: Never    Tobacco comments:  quit 30 years ago   Vaping Use    Vaping status: Never Used   Substance and Sexual Activity    Alcohol use: Yes     Alcohol/week: 0.0 standard drinks of alcohol     Comment: rare    Drug use: No    Sexual activity: Yes     Partners: Male   Other Topics Concern    Not on file   Social History Narrative    Not on file     Social Drivers of Health     Financial Resource Strain: Not on file   Food Insecurity: Not on file   Transportation Needs: Not on file   Physical Activity: Not on file   Stress: Not on file   Social Connections: Not on file   Abuse: Not on file   Housing Stability: Not on file       Examination:    Vitals:  Per nsg entry  BP 128/69 (SITE: left arm, Orthostatic Position: sitting, Cuff Size: regular)   Pulse 67   Ht 1.575 m (5' 2)   Wt 73.3 kg (161 lb 9.6 oz)   LMP 05/06/1983   SpO2 98%    BMI 29.56 kg/m     Body Mass Index:   Body mass index is 29.56 kg/m.    General:  NAD, Pleasant and cooperative, not tired appearing  Oral pharynx: :               Mallampati Classification:  3                 Retronathia?:  none                                    Neck supple?:  yes      Cardiovascular:  extremities well perfused   Respiratory: speaking full sentences, non-labored breathing.   Abdominal:  non-distended, no voluntary guarding.   Skin:  No rash or lesion  EXT: moving all extremities, no deformities   Neuro: Grossly WNL; no focal deficit noted    Diagnostic Studies:    Lab Results   Lab Name Value Date/Time    TSH 1.31 01/18/2024 10:06 AM    TSH 1.22 08/30/2015 08:35 AM    FT4 0.86 04/20/2019 10:29 AM        Lab Results   Lab Name Value Date/Time    WBC 5.9 04/22/2024 08:51 AM    WBC 8.9 08/30/2015 08:35 AM    HGB 11.7 (L) 04/22/2024 08:51 AM    HGB 14.4 08/30/2015 08:35 AM    HCT 34.8 (L) 04/22/2024 08:51 AM    HCT 44.6 08/30/2015 08:35 AM    PLT 154 04/22/2024 08:51 AM    PLT 217 08/30/2015 08:35 AM       Lab Results   Lab Name Value Date/Time    CO2 24 04/29/2024 10:34 AM    CO2 22 (L) 08/30/2015 08:35 AM    BUN 23 (H) 04/29/2024 10:34 AM    BUN 19 08/30/2015 08:35 AM    CR 1.35 (H) 04/29/2024 10:34 AM    CR 0.88 08/30/2015 08:35 AM       Lab Results   Lab Name Value Date/Time    HGBA1C 7.6 (H) 04/22/2024 08:51 AM    HGBA1C 6.6 (H) 08/30/2015 08:35 AM       Lab Results   Lab Name Value Date/Time  FRTN 59 04/29/2024 10:31 AM    FE 67 06/25/2023 11:32 AM       Lab Results   Lab Name Value Date/Time    B12 >2,000 (H) 04/22/2024 08:51 AM       ASSESSMENT/RECOMMENDATIONS:    Assessment & Plan  REM sleep behavior disorder  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 Symptoms less frequent. Differential includes sleep apnea and medication effects. Sleep study warranted.  - Ordered sleep study to assess for elevated muscle activity during REM sleep and rule out other  sleep disorders.  - Recommended trial of melatonin 5 mg at night, increase to 10 mg if tolerated, to assess impact on dream frequency and severity.    Insomnia  Chronic difficulty falling asleep, potentially exacerbated by stress and anxiety.  - Recommended melatonin as a potential aid for sleep initiation.      RTC 3 months     Thank you for allowing me to participate in the care of this patient.     I have spent 40 minutes in face to face visit with over 80% of the visit spent in discussion of symptoms, and medical decision making regarding diagnosis and plan of care and care coordination for dream enactment behavior. The remainder of the time was spent in review of record, study, and data review and documentation.     I obtained verbal consent from the patient or the patients representative and any other participants to use AI ambient technology to transcribe the interactions between the patient and myself during the clinical encounter. I read and reviewed the draft note.    Sincerely,    Electronically Signed 05/17/2024     Brice Sow M.D.  Sleep Medicine    [facility]              [1]   Patient Active Problem List  Diagnosis    Diabetes [facility])    Depression with anxiety    Stress at home    Hypertension    GERD (gastroesophageal reflux disease)    Hyperlipidemia with target LDL less than 70    Abnormal LFTs    Renal cyst ( 6.4 cm cyst left kidney)    Cough    Disorder of liver    Macroalbuminuric diabetic nephropathy (HCC)    History of actinic keratoses    Cataract    Ptosis of eyelid    Liver fibrosis    Adrenal nodule (HCC)    Medication Therapy Auth    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HCC)    Mixed conductive and sensorineural hearing loss of both ears    Neoplasm of uncertain behavior of skin    Abdominal pain, generalized    Nausea without vomiting    Asymptomatic microscopic hematuria    Subcutaneous nodule    Angina pectoris (HCC)    Mild episode of recurrent major depressive  disorder (HCC)    NASH (nonalcoholic steatohepatitis)    Iron  deficiency anemia due to chronic blood loss    Kidney disorder    Type 2 diabetes mellitus with stage 3a chronic kidney disease, with long-term current use of insulin  (HCC)   [2]   Current Outpatient Medications on File Prior to Visit   Medication Sig Dispense Refill    acetaminophen  (TylenoL ) 325 mg tablet Take 1 tablet by mouth if needed for pain (joint pain).      albuterol  (Proair  HFA, Proventil  HFA, Ventolin  HFA) 90 mcg/actuation inhaler Take 2-4  puffs by inhalation every 4 to 6 hours if needed for wheezing. 8.5 g 2    amLODIPine  (Norvasc) 10 mg tablet Take 1 tablet by mouth every day. 90 tablet 3    Atorvastatin  (LIPITOR) 20 mg Tablet Take 1 tablet by mouth every day at bedtime. 90 tablet 3    Blood Sugar Diagnostic (ONETOUCH ULTRA TEST) Strips Use to test blood glucose 2x/day 100 strip 11    CloNIDine  (CATAPRES ) 0.2 mg Tablet Take 1 tablet by mouth 2 times daily. Indications: change of life signs 180 tablet 3    Clopidogrel  (PLAVIX ) 75 mg Tablet Take 1 tablet by mouth every morning. 90 tablet 3    Diclofenac  (VOLTAREN ARTHRITIS PAIN) 1 % Gel Apply to the affected area 4 times daily. 100 g 1    insulin  glargine (Lantus  Solostar U-100 Insulin ) 100 unit/mL (3 mL) pen Use daily up to 40 units per day. Please provide patient with a 90 day supply 45 mL 3    Losartan  (COZAAR ) 100 mg tablet Take 1 tablet by mouth every day. 90 tablet 3    nystatin  (Klayesta ) 100,000 unit/gram powder APPLY A THIN LAYER OF POWDER TO THE AFFECTED AREA FOUR TIMES DAILY AS NEEDED 60 g 0    Ondansetron  (ZOFRAN -ODT) 8 mg disintegrating tablet Take 1 tablet by mouth every 8 hours if needed. 90 tablet 2    pen needle, diabetic (BD Ultra-Fine III Short Pen Needle) 31 gauge x 5/16 Use for injecting insulin  1 times per day 100 each 11    semaglutide  (Ozempic ) 0.25 mg or 0.5 mg (2 mg/3 mL) pen injector Inject 0.5 mg subcutaneously one time each week. Check flow before first  injection with each new pen. 3 mL 2     No current facility-administered medications on file prior to visit.   [3]   Allergies  Allergen Reactions    Sulfa (Sulfonamide Antibiotics) Rash    Contrast Dye [Radiopaque Agent] Hives     Reports has recently tolerated well    Hydrochlorothiazide Other-Reaction in Comments     Patient reports    Januvia [Sitagliptin] Other-Reaction in Comments     Patient reports    Keflex  [Cephalexin ] Abdominal Pain    Lyrica  [Pregabalin ] Other-Reaction in Comments     Patient reports    Omnipaque  [Iohexol ] Other-Reaction in Comments     Patient reports    Prozac [Fluoxetine Hcl] Other-Reaction in Comments     Patient reports    Topiramate Other-Reaction in Comments     Hairfall   [4]   Family History  Problem Relation Name Age of Onset    Diabetes Father      Heart Mother          MI at 57s     Non-contributory Brother      Non-contributory Brother

## 2024-05-17 NOTE — Patient Instructions (Addendum)
 After-visit Patient Instructions:    [facility]    [facility]                                                                                                                [address]  [place], [place] [address]  [phone]           Plan from today's visit:  Schedule sleep study   Consider melatonin 5 - 10 mg at night.     Follow up:    Return in about 3 months (around 08/15/2024).

## 2024-05-17 NOTE — Nursing Note (Signed)
 Patient ID x 2. Vital signs taken, screened for pain, allergies and preferred pharmacy verified.     [name] MA/TECH

## 2024-05-19 ENCOUNTER — Ambulatory Visit: Admitting: Family Medicine

## 2024-05-19 ENCOUNTER — Encounter: Payer: Self-pay | Admitting: Family Medicine

## 2024-05-19 ENCOUNTER — Other Ambulatory Visit: Payer: Self-pay | Admitting: GASTROENTEROLOGY

## 2024-05-19 ENCOUNTER — Encounter: Payer: Self-pay | Admitting: ALLERGY & IMMUNOLOGY

## 2024-05-19 DIAGNOSIS — R110 Nausea: Secondary | ICD-10-CM

## 2024-05-19 DIAGNOSIS — E1129 Type 2 diabetes mellitus with other diabetic kidney complication: Secondary | ICD-10-CM

## 2024-05-19 NOTE — Telephone Encounter (Signed)
 Forwarding to Clinical staff to address

## 2024-05-20 MED ORDER — ONDANSETRON 8 MG DISINTEGRATING TABLET: 8.0000 mg | DISINTEGRATING_TABLET | Freq: Three times a day (TID) | ORAL | 2 refills | Status: AC | PRN

## 2024-05-20 NOTE — Telephone Encounter (Signed)
 Nausea medication sent to pharmacy as requested.

## 2024-05-20 NOTE — Telephone Encounter (Signed)
 RX requested  Ondansetron  (ZOFRAN -ODT) 8 mg disintegrating tablet   Last office visit 02/24/24  Next office visit 06/10/24   Last refilled 04/07/24   Last written 08/12/23     Please evaluate and sign if applicable,    Karleen Purl LVN

## 2024-05-24 ENCOUNTER — Encounter: Payer: Self-pay | Admitting: Family Medicine

## 2024-05-24 NOTE — Telephone Encounter (Signed)
 Advised rx sent by PCP

## 2024-05-25 ENCOUNTER — Ambulatory Visit

## 2024-05-26 ENCOUNTER — Encounter: Payer: Self-pay | Admitting: "Endocrinology

## 2024-05-26 ENCOUNTER — Ambulatory Visit (INDEPENDENT_AMBULATORY_CARE_PROVIDER_SITE_OTHER)

## 2024-05-26 ENCOUNTER — Ambulatory Visit: Attending: Family Medicine | Admitting: Family Medicine

## 2024-05-26 VITALS — BP 113/54 | HR 63 | Temp 97.1°F | Ht 62.0 in | Wt 161.6 lb

## 2024-05-26 DIAGNOSIS — K649 Unspecified hemorrhoids: Secondary | ICD-10-CM | POA: Insufficient documentation

## 2024-05-26 DIAGNOSIS — L304 Erythema intertrigo: Secondary | ICD-10-CM | POA: Insufficient documentation

## 2024-05-26 DIAGNOSIS — N1832 Chronic kidney disease, stage 3b: Secondary | ICD-10-CM | POA: Insufficient documentation

## 2024-05-26 DIAGNOSIS — E1122 Type 2 diabetes mellitus with diabetic chronic kidney disease: Secondary | ICD-10-CM | POA: Insufficient documentation

## 2024-05-26 DIAGNOSIS — Z794 Long term (current) use of insulin: Secondary | ICD-10-CM | POA: Insufficient documentation

## 2024-05-26 DIAGNOSIS — D649 Anemia, unspecified: Secondary | ICD-10-CM

## 2024-05-26 DIAGNOSIS — E1129 Type 2 diabetes mellitus with other diabetic kidney complication: Secondary | ICD-10-CM | POA: Insufficient documentation

## 2024-05-26 DIAGNOSIS — I5032 Chronic diastolic (congestive) heart failure: Secondary | ICD-10-CM | POA: Insufficient documentation

## 2024-05-26 DIAGNOSIS — K7460 Unspecified cirrhosis of liver: Secondary | ICD-10-CM | POA: Insufficient documentation

## 2024-05-26 DIAGNOSIS — C7A8 Other malignant neuroendocrine tumors: Secondary | ICD-10-CM | POA: Insufficient documentation

## 2024-05-26 DIAGNOSIS — N1831 Chronic kidney disease, stage 3a: Secondary | ICD-10-CM | POA: Insufficient documentation

## 2024-05-26 LAB — TRANSFERRIN
Iron Percent Saturation: 18.6 % (ref 15.0–50.0)
Total Iron Binding Capacity: 339 ug/dL (ref 280–400)
Transferrin: 244 mg/dL (ref 200–360)

## 2024-05-26 LAB — BASIC METABOLIC PANEL
Anion Gap: 14 mmol/L (ref 7–15)
Calcium: 10.5 mg/dL — ABNORMAL HIGH (ref 8.6–10.0)
Carbon Dioxide Total: 25 mmol/L (ref 22–29)
Chloride: 103 mmol/L (ref 98–107)
Creatinine Serum: 1.43 mg/dL — ABNORMAL HIGH (ref 0.51–1.17)
E-GFR Creatinine (Female): 38 mL/min/1.73m*2
Glucose: 112 mg/dL — ABNORMAL HIGH (ref 74–109)
Potassium: 4.6 mmol/L (ref 3.4–5.1)
Sodium: 142 mmol/L (ref 136–145)
Urea Nitrogen, Blood (BUN): 27 mg/dL — ABNORMAL HIGH (ref 6–20)

## 2024-05-26 LAB — IRON TOTAL: Iron Total: 63 ug/dL (ref 37–158)

## 2024-05-26 MED ORDER — NYSTATIN 100,000 UNIT/GRAM TOPICAL CREAM: TOPICAL_CREAM | Freq: Two times a day (BID) | TOPICAL | 0 refills | Status: AC

## 2024-05-26 NOTE — Nursing Note (Signed)
 Paula Daugherty has been identified by name, and date of birth.  [name] is here with Spouse     Patient's chief complaint noted.     I have verified and updated the following:  Allergy   Tobacco   Preferred pharmacy  PHQ-2/PHQ9       Health Maintenance(Care Gaps) reviewed with patient     Patient is not due for Advanced Care Planning  - Place ACP documents on counter with green rooming sheet if due.  Provider will have the conversation    Patient is not due for A1C  - Complete POC A1C in clinic if due.  If patient is going to lab TODAY, okay to decline POC. Document reason if patient declines.      Patient is not due for Repeat Blood Pressure Check Screen - Recheck BP using a manual cuff after 5 minutes.  Document updated BP in EPIC under vitals.      Patient is not due for Colorectal Cancer Screen - Inform Provider if CRC screen is due and offer to pend FIT kit/Cologuard or if referral to Gastroenterology for Colonoscopy is appropriate.    Patient is not due for Lung Cancer Screen - Inform Provider if Lung Cancer screen is due.     Vital signs documented below.    [name], LVN    Temp: 36.2 C (97.1 F) (01/22 0943)  Temp src: Temporal (01/22 0943)  Pulse: 63 (01/22 0943)  BP: 113/54 (01/22 0943)  Resp: --  SpO2: 99 % (01/22 0943)  Height: 157.5 cm (5' 2) (01/22 9056)  Weight: 73.3 kg (161 lb 9.6 oz) (01/22 0943)

## 2024-05-26 NOTE — Progress Notes (Signed)
 Paula Daugherty  Family Medicine     ASSESSMENT & PLAN      Assessment & Plan  Type 2 diabetes mellitus with chronic kidney disease stage 3b  Chronic kidney disease stage 3b with fluctuating creatinine levels. A1c is 7.6, goal to reduce below 7.5. Considering nephrology referral due to CKD stage 3b. Discussed potential switch to Mounjaro if Ozempic  is not tolerated or effective.  - Ordered basic metabolic panel to check creatinine levels.  - Referred to nephrology for CKD management.  - Continue Ozempic  0.5 mg if creatinine remains stable; consider stopping if levels increase.  - Discuss potential switch to Mounjaro if Ozempic  is not effective or tolerated.  - Sent prior authorization for Ozempic .    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 Zofran  prescribed for nausea.  - Continue with scheduled upper endolaser procedure with Dr. Jackson.  - Continue Zofran  for nausea management.    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, post-resection  Post-resection status with no current symptoms. Regular follow-up with GI specialists.  - Continue regular follow-up with GI specialists.    Internal and external hemorrhoids with chronic bleeding  Chronic bleeding causing normocytic anemia. Previous colonoscopy showed benign polyps and hemorrhoids. Considering referral to colorectal surgery.  - Ordered iron  studies to assess anemia.  - Referred to colorectal surgery for hemorrhoid management.    Normocytic anemia  Previous ferritin levels questioned due to potential lab error.  - Ordered iron  studies to evaluate anemia.  - Referred to colorectal surgery for hemorrhoid management.    Intertrigo  Chronic intertrigo managed with nystatin  powder. Insurance no longer covers nystatin  powder.  - Prescribed nystatin  cream as an alternative to powder.            ICD-10-CM    1. Type 2 diabetes mellitus with stage 3a  chronic kidney disease, with long-term current use of insulin  (HCC)  E11.22     N18.31     Z79.4       2. CKD stage 3b, GFR 30-44 ml/min (HCC)  N18.32 ADULT RENAL CLINIC REFERRAL      3. Cirrhosis of liver without ascites, unspecified hepatic cirrhosis type (HCC)  K74.60       4. 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HCC)  C7A.8       5. Hemorrhoids, unspecified hemorrhoid type  K64.9 SURGERY COLORECTAL REFERRAL      6. Normocytic anemia  D64.9 Transferrin     Iron  Total      7. Intertrigo  L30.4 nystatin  (Mycostatin ) 100,000 unit/gram cream            SUBJECTIVE      Paula Daugherty is a 79yr old female.    History of Present Illness  Paula Daugherty is a 79 year old female with diabetes and chronic kidney disease who presents for medication management and follow-up on her kidney function. She is accompanied by her husband, Mr. Kurtenbach.    Glycemic control and diabetes management  - Diabetes managed with Ozempic , increased to 0.5 mg weekly from 0.25 mg three weeks ago  - Last hemoglobin A1c was 7.6%; goal is to lower below 7.5%  - Regular self-monitoring of blood glucose; has experienced low readings, such as 97 mg/dL, which are symptomatic  - Uses Lantus  insulin  with dose adjustments based on blood glucose readings  - Concerned about the impact of diabetes medications on kidney function    Chronic  kidney disease  - Chronic kidney disease with creatinine fluctuations since 2017  - Creatinine levels have been in the red zone more frequently since 2024  - Last creatinine checked one month ago; due for repeat testing today  - No prior nephrology evaluation    Gastrointestinal symptoms and history  - Nausea managed with Zofran  as needed  - History of stomach cancer diagnosed in 2017  - History of gastric antral vascular ectasia ('watermelon stomach') causing gastrointestinal bleeding  - Undergoes regular upper endoscopies with laser treatment for bleeding control  - Cirrhosis of non-alcoholic  etiology    Cerebrovascular disease  - Ischemic stroke in June 2024  - Takes Plavix  for secondary stroke prevention  - Occasional word-finding difficulty; no other residual neurological deficits    Rectal bleeding and anemia  - Daily rectal bleeding attributed to hemorrhoids  - Diagnosed with normocytic anemia; ferritin levels have been low  - Questions accuracy of most recent ferritin test  - Wears protection daily due to ongoing bleeding  - Recent colonoscopy revealed benign polyps and diverticulosis    Vasomotor symptoms  - Hot flashes primarily at night, managed with clonidine   - Clonidine  provides partial relief  - Avoids hormone therapy due to associated risks    Neuropathic pain  - History of sciatic pain  - Previously treated with gabapentin  and Cymbalta ; not currently taking these medications    Sleep disturbance  - Chronic sleep issues  - Considering melatonin for sleep but has not started due to concerns about effects on blood glucose      OBJECTIVE      Her height is 1.575 m (5' 2) and weight is 73.3 kg (161 lb 9.6 oz). Her temporal temperature is 36.2 C (97.1 F). Her blood pressure is 113/54 and her pulse is 63. Her oxygen saturation is 99%.     Physical Exam  VITALS: BP- 113/54  Gen: well appearing, no acute distress  Eyes: conjunctiva normal, EOMI  Mouth: moist  Resp: no increased work of breathing    Results  Labs  Creatinine (Thursday May 26, 2024): 1.35  Hemoglobin (Thursday May 26, 2024): 11.7  Ferritin (Thursday May 26, 2024): Checked; result questioned, not confirmed as low  HbA1c (Thursday May 26, 2024): 7.6  LDL (Thursday May 26, 2024): Very low  Triglycerides (Thursday May 26, 2024): Elevated but not at critical threshold    Diagnostic  Colonoscopy (Summer 2025): Normal to terminal ileum; benign-appearing polyps; colonic diverticulosis; internal and external hemorrhoids       Total time I spent in care of this patient today (excluding time spent on other billable  services) was 20 minutes.     I obtained verbal consent from the patient to use AI ambient technology to transcribe the interactions between the patient and myself during the clinical encounter.    Today's visit has complexity inherent to evaluation and management associated with medical care services that serve as the continuing focal point for all needed health care services.    Epifanio Polite, MD  [facility]

## 2024-05-27 ENCOUNTER — Ambulatory Visit: Payer: Self-pay | Admitting: Family Medicine

## 2024-05-31 ENCOUNTER — Telehealth: Payer: Self-pay

## 2024-05-31 NOTE — Telephone Encounter (Signed)
 Left message for patient to return call at [phone]    When patient returns call, please schedule referral#[id number]   Nephrology Department New Patient.      Thank you  Stefanie G. Sibayan  Patient Service Representative III  [facility] - Patient Contact Center

## 2024-06-01 ENCOUNTER — Ambulatory Visit: Admitting: ALLERGY & IMMUNOLOGY

## 2024-06-02 ENCOUNTER — Ambulatory Visit

## 2024-06-02 DIAGNOSIS — F332 Major depressive disorder, recurrent severe without psychotic features: Secondary | ICD-10-CM

## 2024-06-02 NOTE — Progress Notes (Signed)
 [name], If you are reviewing this progress note and have questions about the meaning or clinical terms being used, please feel free to bring it up when we talk next.  Medical notes are meant to be a communication tool between professional providers and require medical terms to be used for efficiency. You can also look up terms via on-line medical dictionaries such as Medline Plus at:  achegone.com    Medical Record Number:  [id number]   Patient:  Paula Daugherty   Date of Birth:  1945/06/03   Date of Visit:  06/02/2024     PPE USED:  Patient was wearing a surgical mask.no  Provider was wearing a surgical mask and face shield: no      Type of Visit: Individual Psychotherapy  Time: 60 min.   CPT Code: 09162 - Psychotherapy, (53 mins or more) 60 minutes with patient and/or family member.    Identifying Information/ Chief Complaint:     Paula Daugherty is a 79yr old female presenting for loss and anxiety and depression symptoms.    Objective/Mental Status Exam:      Appearance: appropriate  Kinetics & gait: Normal gait, no apparent psychomotor retardation or agitation.   Demeanor: cooperative  Speech: Coherent, clear, appropriate volume, normal rhythm and flow  Language: Normal word choice, vocabulary adequate for communication.  Mood/Affect: Worried, Frustrated, Fearful, Anxious, Irritable, Angry, Depressed, and Sad mood with congruent to mood and appropriate to content affect.   Suicidal/Homicidal/Assaultive Thoughts/Intent/Plans:  Denies current SI/HI, intent, mean and plan.   Thought Process: Linear & goal directed  Thought Content: appropriate, no observed evidence of illogical thought.  Orientation:  Alert and Oriented  Attention: normal and follows conversation thread  Memory: No short or long-term memory deficits evident.   Judgment:  Good  Insight: Good    Assessment:     -This is patient's 8th session since intake appointment. The Patient is demonstrating higher scores on  screening tools, and subjectively reports she is having difficulty functioning.     -Patient is progressing towards goals as evidenced by continued participation in the therapeutic process and overall high investment in achieving therapeutic goals.    Safety    Pt's acute risk for suicide is low. Pt presents without current suicidal ideation, intent, plan, nor preparatory behavior.     Pt demonstrates no imminent risk of danger to others given no H/I reported today, no aggressive behavior, and no known h/o violence.      Plan to continue to assess and a higher level of care is not indicated at this time.    Medication questions:  Open to medications to address anxiety and depression.    Diagnosis:    No new or revised diagnoses are needed at this time.     Validated Behavioral Health Measures:     PHQ-9:       06/02/2024    10:20 AM   PHQ-9   Interest 3   Feeling 3   Sleeping 3   Tired 3   Appetite 2   Feeling Bad 3   Concentrating 3   Moving and Speaking 2   Suicidal Thoughts 0   Problems 2   PHQ-9 Total 22   PHQ-9 Affective Total 12   PHQ-9 Physical Total 10   PHQ-9 Cognitive Total 3       GAD-7:       06/02/2024    10:21 AM   GAD7 Results   Feeling Nervous, Anxious, or on Edge 3  Not Being Able to Stop or Control Worrying 3   Worrying too Much About Different Things 3   Trouble Relaxing 3   Being so Restless That it is Hard to Sit Still 0   Becoming Easily Annoyed or Irritable 3   Feeling Afraid as if Something Awful Might Happen 3   GAD-7 Total Score 18   If you checked off any problems, how difficult have these problems made it for you to do your work, take care of things at home, or get along with other people? Extremely difficult        Psychotherapy Content:     [name] reports that her daughter was eventually released from hospital with improved symptoms but no clear diagnosis.  The patient states that interactions with and son-in-law have been limited to 1 visit to support with caregiving and 2 weeks no  interaction.  The patient and this clinical research associate discussed current interactions as attempting to please others to maintain contact with her daughter.  Patient acknowledges that ongoing sleep difficulties and lowered mood is now concern.  Patient acknowledges hospital need for medications support and ongoing therapy.  The patient states concerns regarding medication due to history of side effects primarily weight gain.  Patient stated she was willing to accept medication for mood at this time due to impact to functioning mental emotional energy levels, low mood, anhedonia, decreased appetite, irritability and fear of what might happen with no sense of relaxing.  Patient accepting update PCP regarding medication assessment.    Interventions  Led client in discussion of current stressors and coping skills  Introduced strategy of grounding techniques for sleep.  Clinician encouraged increased review for significant depression leading to medication and ongoing counseling support.    Response to treatment  [name] was responsive to practicing grounding skills and review for medication evaluation.  [name] willing to continue mood diary with increased benefit of emotional awareness.  [name] willing to practice grounding techniques to decrease and direct the arousal during sleep.   [name] willing to practice reframing thought patterns and evaluate behavior rather than engage in negative self talk. [name] will continue practice CBT skills.     Therapeutic Modalities Used:      -Clinician utilized supportive psychotherapy interventions including empathic listening, validation, reflection, and problem solving.  -Clinician provided brief psychoeducation about CBT techniques commonly used to address depression/anxiety in therapy.  -Clinician utilized a strength-based perspective to assist patient in exploring skills and strengths to be used in overcoming current difficulties.    Plan:      -Patient may benefit from continued engagement in  talk therapy and to facilitate continued adjustments; continued boundary setting and self care will be pursued.    -Continue current therapeutic focus with weekly to bi-weekly individual psychotherapy sessions.  -Next scheduled session: 06-23-24 at 10:15    Resources: Didactic thought model worksheet and Grounding techniques  Homework:Review and use grounding technique 1-3 times in the next week to improve return to sleep. and Review Serenity Prayer.  Review and Journal moments of anxiety  Make intentional time to identify issues of concern and create a heartfelt and clear message regarding needs or concerns.  Goals: - Review Handout or model discussed in treatment to identify if current strategy is useful., - Please use a reminder to take your Medication as prescribed and alert provider of your questions. , and - Please follow up with counselors in the community to get an intake appointment scheduled for continued growth through therapy.  In [place] Chyrl Milta KEN  License No [id number]  Valid until: 07/02/2024  Behavioral Health Clinician  Collaborative Care Program  [facility]      The patient understands and agrees with the plan of care outlined.    Have questions about billing or payments?  - Call [phone]; available Monday-Friday, 8:30am to 4pm  - Toll free: [phone]  - TDD (hearing impaired): [phone]  - Email: [email or web address]

## 2024-06-02 NOTE — Progress Notes (Addendum)
 Paula Daugherty, If you are reviewing this progress note and have questions about the meaning or clinical terms being used, please feel free to bring it up when we talk next.  Medical notes are meant to be a communication tool between professional providers and require medical terms to be used for efficiency. You can also look up terms via on-line medical dictionaries such as Medline Plus at:  muscletreatments.it.htm      Patient was discussed during Collaborative Care (CoCM) Systematic Case Review (SCR) meeting.    Assessment    - Patient has completed 8 therapy sessions since intake and is demonstrating an increase in symptoms as evidenced by screening scores due to acute stressor of family medical problems.     - Patient is progressing in learning mindfulness techniques and self care strategies to mitigate depressive and anxious symptomology.    - Patient's progress in treatment may be improved by medications evaluation.    -Patient may benefit from psychiatric consultation regarding possible medications to assist in mitigating depressive and anxious symptomology.    Reason for review  - Requesting medication evaluation.  - Has completed 8th session.  - anxiety and depression symptoms maintained or not improving.    Plan  - Continue current therapeutic focus w/ weekly transitioning to bi-weekly individual psychotherapy sessions.    - Requesting referral to Clarita Mallick for [facility] psychiatric specialty consult. No medication recommendations have been provided No medication recommendations have been provided to [name], [name], MD at this time.      Chyrl Ply, LCSW  License No [id number]  Valid until: 07/02/2024  Behavioral Health Clinician  Collaborative Care Program  [facility]      I'm a Collaborative Care [facility]) Program Psychiatry consultant Psychiatry Mental Health Nurse Practitioner at [facility]. This patient was discussed in [facility] on 06/02/24  Reyes Ply,  LCSW. I have not personally examined or met with this patient. I do agree with LCSW's assessment (see note).     Based on this assessment, agree w/ plan for patient to continue psychotherapy visits with LCSW.     No provider treatment recommendations at this time.     Thank you for referring patient to [facility].     Electronically signed by: Clarita Mallick, DNP, FNP-C, PMHNP-BC  Collaborative Care Provider    [facility]

## 2024-06-06 ENCOUNTER — Other Ambulatory Visit: Payer: Self-pay | Admitting: Family Medicine

## 2024-06-07 NOTE — Progress Notes (Signed)
 Date and Time of Pre-GI procedure call : 06/07/2024 at 1010    3 patient identifications obtained.  Patient confirmed 06/10/2024 EGD appointment date and time, aware must have a responsible adult family or friend to drive them home after their procedure. Patient verbalized understanding to expect to be at their appointment for 2-3 hours and arrive 30 minutes early. Patient confirmed they're on Ozempic  and they verbalized understanding to follow clear liquids the day before their procedure. I sent the patient our updated EGD prep instructions to their MyChart.    Per RN's note in this pre-visit planning: Spoke with patient, states she spoke with her PCP and endocrinologist, has been instructed by her MD on holding her Plavix , Ozempic , and her insulin .      Patient states their prescribing physician advised the patient to hold their Plavix  7 days prior to the EGD appointment.    Instructed to call [facility] until 4:30pm M-F if any questions or concerns, or if they develop COVID or flu-like symptoms at [phone] option # 3.  All questions answered and verbalized understanding.    Mikell Siva, LVN  [facility]

## 2024-06-10 ENCOUNTER — Ambulatory Visit: Admitting: GASTROENTEROLOGY

## 2024-06-23 ENCOUNTER — Ambulatory Visit

## 2024-07-04 ENCOUNTER — Ambulatory Visit

## 2024-07-20 ENCOUNTER — Ambulatory Visit

## 2024-07-26 ENCOUNTER — Ambulatory Visit: Admitting: "Endocrinology

## 2024-08-02 ENCOUNTER — Ambulatory Visit

## 2024-08-29 ENCOUNTER — Ambulatory Visit: Admitting: Family Medicine

## 2024-08-30 ENCOUNTER — Ambulatory Visit: Admitting: INTERNAL MEDICINE

## 2024-09-12 ENCOUNTER — Ambulatory Visit: Admitting: PULMONARY DISEASE

## 2024-09-21 ENCOUNTER — Ambulatory Visit: Admitting: "Endocrinology

## 2024-10-03 ENCOUNTER — Ambulatory Visit

## 2024-10-04 ENCOUNTER — Ambulatory Visit: Admitting: Audiologist

## 2024-10-04 ENCOUNTER — Ambulatory Visit: Admitting: Otolaryngology

## 2024-10-05 ENCOUNTER — Ambulatory Visit: Admitting: Internal Medicine

## 2025-04-17 ENCOUNTER — Ambulatory Visit: Payer: Vision Other Private Insurance | Admitting: Optometrist
